# Patient Record
Sex: Female | Born: 1950
Health system: Southern US, Community
[De-identification: ages and names within clinical notes are randomized; demographics above are authoritative.]

## PROBLEM LIST (undated history)

## (undated) DIAGNOSIS — S060X9A Concussion with loss of consciousness of unspecified duration, initial encounter: Secondary | ICD-10-CM

## (undated) DIAGNOSIS — F329 Major depressive disorder, single episode, unspecified: Secondary | ICD-10-CM

## (undated) DIAGNOSIS — M7989 Other specified soft tissue disorders: Secondary | ICD-10-CM

## (undated) DIAGNOSIS — F1010 Alcohol abuse, uncomplicated: Secondary | ICD-10-CM

## (undated) DIAGNOSIS — Z923 Personal history of irradiation: Secondary | ICD-10-CM

## (undated) DIAGNOSIS — K529 Noninfective gastroenteritis and colitis, unspecified: Secondary | ICD-10-CM

## (undated) DIAGNOSIS — E079 Disorder of thyroid, unspecified: Secondary | ICD-10-CM

## (undated) DIAGNOSIS — C801 Malignant (primary) neoplasm, unspecified: Secondary | ICD-10-CM

## (undated) DIAGNOSIS — E559 Vitamin D deficiency, unspecified: Secondary | ICD-10-CM

## (undated) DIAGNOSIS — I4891 Unspecified atrial fibrillation: Secondary | ICD-10-CM

## (undated) DIAGNOSIS — F32A Depression, unspecified: Secondary | ICD-10-CM

## (undated) DIAGNOSIS — G8929 Other chronic pain: Secondary | ICD-10-CM

## (undated) DIAGNOSIS — M4800 Spinal stenosis, site unspecified: Secondary | ICD-10-CM

## (undated) DIAGNOSIS — F419 Anxiety disorder, unspecified: Secondary | ICD-10-CM

## (undated) DIAGNOSIS — I10 Essential (primary) hypertension: Secondary | ICD-10-CM

## (undated) DIAGNOSIS — Z9221 Personal history of antineoplastic chemotherapy: Secondary | ICD-10-CM

## (undated) DIAGNOSIS — E039 Hypothyroidism, unspecified: Secondary | ICD-10-CM

## (undated) HISTORY — DX: Other specified soft tissue disorders: M79.89

## (undated) HISTORY — DX: Vitamin D deficiency, unspecified: E55.9

## (undated) HISTORY — DX: Essential (primary) hypertension: I10

## (undated) HISTORY — DX: Major depressive disorder, single episode, unspecified: F32.9

## (undated) HISTORY — DX: Spinal stenosis, site unspecified: M48.00

## (undated) HISTORY — PX: SHOULDER SURGERY: SHX246

## (undated) HISTORY — DX: Noninfective gastroenteritis and colitis, unspecified: K52.9

## (undated) HISTORY — PX: EYE SURGERY: SHX253

## (undated) HISTORY — DX: Anxiety disorder, unspecified: F41.9

## (undated) HISTORY — DX: Disorder of thyroid, unspecified: E07.9

## (undated) HISTORY — DX: Concussion with loss of consciousness of unspecified duration, initial encounter: S06.0X9A

## (undated) HISTORY — DX: Depression, unspecified: F32.A

---

## 1998-03-06 ENCOUNTER — Ambulatory Visit (HOSPITAL_COMMUNITY): Admission: RE | Admit: 1998-03-06 | Discharge: 1998-03-06 | Payer: Self-pay | Admitting: *Deleted

## 1998-03-20 HISTORY — PX: BREAST SURGERY: SHX581

## 1998-03-24 ENCOUNTER — Ambulatory Visit (HOSPITAL_COMMUNITY): Admission: RE | Admit: 1998-03-24 | Discharge: 1998-03-24 | Payer: Self-pay | Admitting: *Deleted

## 1999-02-26 ENCOUNTER — Ambulatory Visit (HOSPITAL_COMMUNITY): Admission: RE | Admit: 1999-02-26 | Discharge: 1999-02-26 | Payer: Self-pay | Admitting: Obstetrics and Gynecology

## 1999-02-26 ENCOUNTER — Encounter: Payer: Self-pay | Admitting: Obstetrics and Gynecology

## 1999-10-06 ENCOUNTER — Other Ambulatory Visit: Admission: RE | Admit: 1999-10-06 | Discharge: 1999-10-06 | Payer: Self-pay | Admitting: Obstetrics and Gynecology

## 2000-05-03 ENCOUNTER — Encounter: Payer: Self-pay | Admitting: Family Medicine

## 2000-05-03 ENCOUNTER — Ambulatory Visit (HOSPITAL_COMMUNITY): Admission: RE | Admit: 2000-05-03 | Discharge: 2000-05-03 | Payer: Self-pay | Admitting: Family Medicine

## 2000-10-05 ENCOUNTER — Other Ambulatory Visit: Admission: RE | Admit: 2000-10-05 | Discharge: 2000-10-05 | Payer: Self-pay | Admitting: Obstetrics and Gynecology

## 2000-10-11 ENCOUNTER — Encounter: Payer: Self-pay | Admitting: Obstetrics and Gynecology

## 2000-10-11 ENCOUNTER — Encounter: Admission: RE | Admit: 2000-10-11 | Discharge: 2000-10-11 | Payer: Self-pay | Admitting: Obstetrics and Gynecology

## 2001-11-01 ENCOUNTER — Other Ambulatory Visit: Admission: RE | Admit: 2001-11-01 | Discharge: 2001-11-01 | Payer: Self-pay | Admitting: Obstetrics and Gynecology

## 2002-11-07 ENCOUNTER — Other Ambulatory Visit: Admission: RE | Admit: 2002-11-07 | Discharge: 2002-11-07 | Payer: Self-pay | Admitting: Obstetrics and Gynecology

## 2003-10-01 ENCOUNTER — Ambulatory Visit (HOSPITAL_COMMUNITY): Admission: RE | Admit: 2003-10-01 | Discharge: 2003-10-01 | Payer: Self-pay | Admitting: Gastroenterology

## 2003-11-20 ENCOUNTER — Other Ambulatory Visit: Admission: RE | Admit: 2003-11-20 | Discharge: 2003-11-20 | Payer: Self-pay | Admitting: Obstetrics and Gynecology

## 2004-01-08 ENCOUNTER — Encounter: Admission: RE | Admit: 2004-01-08 | Discharge: 2004-01-08 | Payer: Self-pay | Admitting: Obstetrics and Gynecology

## 2004-02-12 ENCOUNTER — Encounter: Admission: RE | Admit: 2004-02-12 | Discharge: 2004-02-12 | Payer: Self-pay | Admitting: Orthopedic Surgery

## 2004-12-21 ENCOUNTER — Other Ambulatory Visit: Admission: RE | Admit: 2004-12-21 | Discharge: 2004-12-21 | Payer: Self-pay | Admitting: Obstetrics and Gynecology

## 2005-12-27 ENCOUNTER — Other Ambulatory Visit: Admission: RE | Admit: 2005-12-27 | Discharge: 2005-12-27 | Payer: Self-pay | Admitting: Obstetrics and Gynecology

## 2006-01-18 ENCOUNTER — Encounter: Admission: RE | Admit: 2006-01-18 | Discharge: 2006-01-18 | Payer: Self-pay | Admitting: Obstetrics and Gynecology

## 2006-12-29 ENCOUNTER — Other Ambulatory Visit: Admission: RE | Admit: 2006-12-29 | Discharge: 2006-12-29 | Payer: Self-pay | Admitting: Obstetrics & Gynecology

## 2006-12-31 ENCOUNTER — Other Ambulatory Visit: Admission: RE | Admit: 2006-12-31 | Discharge: 2006-12-31 | Payer: Self-pay | Admitting: Obstetrics and Gynecology

## 2007-02-18 DIAGNOSIS — S060XAA Concussion with loss of consciousness status unknown, initial encounter: Secondary | ICD-10-CM

## 2007-02-18 DIAGNOSIS — S060X9A Concussion with loss of consciousness of unspecified duration, initial encounter: Secondary | ICD-10-CM

## 2007-02-18 HISTORY — DX: Concussion with loss of consciousness status unknown, initial encounter: S06.0XAA

## 2007-02-18 HISTORY — DX: Concussion with loss of consciousness of unspecified duration, initial encounter: S06.0X9A

## 2007-03-05 ENCOUNTER — Inpatient Hospital Stay (HOSPITAL_COMMUNITY): Admission: EM | Admit: 2007-03-05 | Discharge: 2007-03-09 | Payer: Self-pay | Admitting: Neurosurgery

## 2007-03-05 ENCOUNTER — Encounter: Payer: Self-pay | Admitting: Emergency Medicine

## 2007-04-19 ENCOUNTER — Encounter: Admission: RE | Admit: 2007-04-19 | Discharge: 2007-04-19 | Payer: Self-pay | Admitting: Neurosurgery

## 2007-06-28 ENCOUNTER — Encounter: Admission: RE | Admit: 2007-06-28 | Discharge: 2007-06-28 | Payer: Self-pay | Admitting: Neurology

## 2008-08-08 ENCOUNTER — Encounter: Admission: RE | Admit: 2008-08-08 | Discharge: 2008-08-08 | Payer: Self-pay | Admitting: Family Medicine

## 2009-01-30 ENCOUNTER — Other Ambulatory Visit: Admission: RE | Admit: 2009-01-30 | Discharge: 2009-01-30 | Payer: Self-pay | Admitting: Obstetrics and Gynecology

## 2009-03-20 ENCOUNTER — Encounter: Admission: RE | Admit: 2009-03-20 | Discharge: 2009-03-20 | Payer: Self-pay | Admitting: Obstetrics and Gynecology

## 2010-12-24 ENCOUNTER — Encounter
Admission: RE | Admit: 2010-12-24 | Discharge: 2010-12-24 | Payer: Self-pay | Source: Home / Self Care | Attending: Family Medicine | Admitting: Family Medicine

## 2010-12-29 ENCOUNTER — Encounter
Admission: RE | Admit: 2010-12-29 | Discharge: 2011-01-19 | Payer: Self-pay | Source: Home / Self Care | Attending: Family Medicine | Admitting: Family Medicine

## 2011-03-02 ENCOUNTER — Encounter (HOSPITAL_COMMUNITY)
Admission: RE | Admit: 2011-03-02 | Discharge: 2011-03-02 | Disposition: A | Discharge status: Home / Self Care | Payer: BC Managed Care – PPO | Source: Ambulatory Visit | Attending: Neurosurgery | Admitting: Neurosurgery

## 2011-03-02 LAB — CBC
HCT: 37.8 % (ref 36.0–46.0)
Hemoglobin: 12.7 g/dL (ref 12.0–15.0)
MCH: 31.1 pg (ref 26.0–34.0)
MCHC: 33.6 g/dL (ref 30.0–36.0)
MCV: 92.6 fL (ref 78.0–100.0)
Platelets: 242 10*3/uL (ref 150–400)
RBC: 4.08 MIL/uL (ref 3.87–5.11)
RDW: 12.2 % (ref 11.5–15.5)
WBC: 5.7 10*3/uL (ref 4.0–10.5)

## 2011-03-02 LAB — TYPE AND SCREEN
ABO/RH(D): A POS
Antibody Screen: NEGATIVE

## 2011-03-02 LAB — SURGICAL PCR SCREEN
MRSA, PCR: NEGATIVE
Staphylococcus aureus: NEGATIVE

## 2011-03-02 LAB — ABO/RH: ABO/RH(D): A POS

## 2011-03-04 ENCOUNTER — Inpatient Hospital Stay (HOSPITAL_COMMUNITY): Payer: BC Managed Care – PPO

## 2011-03-04 ENCOUNTER — Inpatient Hospital Stay (HOSPITAL_COMMUNITY)
Admission: RE | Admit: 2011-03-04 | Discharge: 2011-03-08 | DRG: 756 | Disposition: A | Discharge status: Home / Self Care | Payer: BC Managed Care – PPO | Source: Ambulatory Visit | Attending: Neurosurgery | Admitting: Neurosurgery

## 2011-03-04 DIAGNOSIS — M7130 Other bursal cyst, unspecified site: Secondary | ICD-10-CM | POA: Diagnosis present

## 2011-03-04 DIAGNOSIS — Z01818 Encounter for other preprocedural examination: Secondary | ICD-10-CM

## 2011-03-04 DIAGNOSIS — Z01812 Encounter for preprocedural laboratory examination: Secondary | ICD-10-CM

## 2011-03-04 DIAGNOSIS — M545 Low back pain, unspecified: Secondary | ICD-10-CM | POA: Diagnosis present

## 2011-03-04 DIAGNOSIS — M5137 Other intervertebral disc degeneration, lumbosacral region: Secondary | ICD-10-CM | POA: Diagnosis present

## 2011-03-04 DIAGNOSIS — M4310 Spondylolisthesis, site unspecified: Principal | ICD-10-CM | POA: Diagnosis present

## 2011-03-04 DIAGNOSIS — M48061 Spinal stenosis, lumbar region without neurogenic claudication: Secondary | ICD-10-CM | POA: Diagnosis present

## 2011-03-04 DIAGNOSIS — M51379 Other intervertebral disc degeneration, lumbosacral region without mention of lumbar back pain or lower extremity pain: Secondary | ICD-10-CM | POA: Diagnosis present

## 2011-03-04 HISTORY — PX: SPINAL FUSION: SHX223

## 2011-03-05 LAB — CBC
HCT: 32.5 % — ABNORMAL LOW (ref 36.0–46.0)
Hemoglobin: 11 g/dL — ABNORMAL LOW (ref 12.0–15.0)
MCH: 32.3 pg (ref 26.0–34.0)
MCHC: 33.8 g/dL (ref 30.0–36.0)
MCV: 95.3 fL (ref 78.0–100.0)
Platelets: 202 10*3/uL (ref 150–400)
RBC: 3.41 MIL/uL — ABNORMAL LOW (ref 3.87–5.11)
RDW: 12.6 % (ref 11.5–15.5)
WBC: 8.7 10*3/uL (ref 4.0–10.5)

## 2011-03-05 LAB — BASIC METABOLIC PANEL
BUN: 6 mg/dL (ref 6–23)
CO2: 29 mEq/L (ref 19–32)
Calcium: 8.3 mg/dL — ABNORMAL LOW (ref 8.4–10.5)
Chloride: 102 mEq/L (ref 96–112)
Creatinine, Ser: 0.8 mg/dL (ref 0.4–1.2)
GFR calc Af Amer: >60 mL/min (ref >60)
GFR calc non Af Amer: >60 mL/min (ref >60)
Glucose, Bld: 124 mg/dL — ABNORMAL HIGH (ref 70–99)
Potassium: 3.7 mEq/L (ref 3.5–5.1)
Sodium: 135 mEq/L (ref 135–145)

## 2011-03-05 LAB — GLUCOSE, CAPILLARY: Glucose-Capillary: 86 mg/dL (ref 70–99)

## 2011-03-15 NOTE — Op Note (Signed)
[name], [name]              ACCOUNT NO.:  [id number]  MEDICAL RECORD NO.:  [id number]           PATIENT TYPE:  I  LOCATION:  3526                         FACILITY:  [facility]  PHYSICIAN:  Cristi Loron, M.D.DATE OF BIRTH:  November 24, 1951  DATE OF PROCEDURE:  03/04/2011 DATE OF DISCHARGE:                              OPERATIVE REPORT   BRIEF HISTORY:  The patient is a 60 year old white female who has suffered from back, hip, and leg pain consistent with neurogenic claudication.  She has failed medical management, was worked up with lumbar MRI which demonstrated the patient has a grade 1 spondylolisthesis at L4-5 with severe facet arthropathy, synovial cyst, spinal stenosis, etc.  I discussed the various treatment options with the patient including surgery.  She has weighed the risks, benefits, and alternatives of surgery, and wants to proceed with an L4-5 decompression, instrumentation, and fusion.  PREOPERATIVE DIAGNOSES:  L4-5 grade 1 acquired spondylolisthesis, spinal stenosis, synovial cyst, facet arthropathy, disk degeneration, lumbar radiculopathy, lumbago.  POSTOPERATIVE DIAGNOSES:  L4-5 grade 1 acquired spondylolisthesis, spinal stenosis, synovial cyst, facet arthropathy, disk degeneration, lumbar radiculopathy, lumbago.  PROCEDURE:  Bilateral L4 laminotomies and foraminotomies, two decompressive bilateral L4 as well as L5 nerve roots (the work required to this was in addition to work required to do the posterior lumbar interbody  fusion because of the patient's severe facet arthropathy, spinal stenosis, and large synovial cyst, i.e. we had to perform wide laminotomies and foraminotomies about the bilateral L4 and L5 nerve roots); L4-5 posterior lumbar interbody fusion with local morselized autograft bone and Actifuse bone graft extender; insertion of L4-5 interbody prosthesis (Nobel PEEK interbody prosthesis); L4-5 posterior nonsegmental instrumentation with Legacy  titanium pedicle screws and rods; L4-5 posterolateral arthrodesis with local morselized autograft bone, and Vitoss bone graft extender.  SURGEON:  Cristi Loron, M.D.  ASSISTANT:  Danae Orleans. Venetia Maxon, M.D.  ANESTHESIA:  General endotracheal.  ESTIMATED BLOOD LOSS:  200 mL.  SPECIMENS:  None.  DRAINS:  None.  COMPLICATIONS:  None.  DESCRIPTION OF PROCEDURE:  The patient was brought to the operating room by the anesthesia team.  General endotracheal anesthesia was induced. The patient was then turned to the prone position on the Wilson frame. The lumbosacral region was then prepared with Betadine scrub and Betadine solution.  Sterile drapes were applied and I injected the area to be incised with Marcaine with epinephrine solution.  I used a scalpel to make a linear midline incision over the L4-L5 interspace.  I used electrocautery to perform a bilateral subperiosteal dissection exposing the spinous process, lamina of L3, L4, and L5.  We obtained intraoperative radiograph to confirm our location.  We then inserted a Versatrac retractor for exposure.  We began decompression by using a high-speed drill to perform bilateral L4 laminotomies.  I widened laminotomies and performed medial facetectomies at L4-5 with a Kerrison punch.  We removed the ligamentum flavum at L4- 5.  We encountered a large bilateral synovial cyst as expected.  We removed these using the Kerrison punch.  We perform wide foraminotomies about the bilateral L4 as well as L5 nerve roots completing the decompression.  We now turned our attention to the posterior lumbar interbody fusion. We incised the L4-5 intervertebral disk bilaterally with a 15-blade scalpel.  We performed a partial intervertebral discectomy with pituitary forceps.  We then prepared the vertebral endplates by using curettes to clear the remainder of the intervertebral disk.  We used trial spacers and determined to use 10 x 26 mm interbody  prosthesis bilaterally.  We prefilled prosthesis with a combination of local morselized autograft bone that we obtained during the decompression as well as Actifuse bone graft extender.  We inserted the prosthesis into the L4-5 interspaces bilaterally, of course, after retracting the neural structures out of harm's way.  We then filled the remainder of the intervertebral disk space with Actifuse and local autograft bone, completed the posterior lumbar interbody fusion.  We now turned our attention to the spinal instrumentation.  Under fluoroscopic guidance, we cannulated the bilateral L4 and L5 pedicles with the bone probe.  We removed the probe and then tapped the tract with a 6.5-mm tap.  We removed the tap and probed inside the pedicles to rule out cortical breeches.  We inserted a 7.5 x 50 mm pedicle screws bilaterally at L4 and L5 under fluoroscopic guidance.  We palpated along the medial aspect of the L4-L5 pedicles and noted there was no cortical breeches.  We then connected the unilateral pedicle screws with a lordotic rod.  We compressed the construct and then secured the rod in place with caps.  This completed the instrumentation.  We now turned our attention to posterolateral arthrodesis.  We used a high-speed drill to decorticate the remainder of the L4 and L5 facets, pars, and transverse processes etc.  We laid a combination of local autograft bone and Vitoss bone graft extender over these decorticated posterolateral structures completing the posterolateral arthrodesis.  We then inspected the thecal sac and the bilateral L4 and L5 nerve roots, and noted they were well decompressed.  We obtained hemostasis using bipolar electrocautery.  We irrigated the wound out with bacitracin solution.  We then removed the retractor and reapproximated the patient's thoracolumbar fascia with interrupted #1 Vicryl suture, subcutaneous tissue with interrupted 2-0 Vicryl suture, and the  skin with Steri-Strips and Benzoin.  The wound was then coated with bacitracin ointment and sterile dressing applied.  The drapes were removed and the patient was subsequently returned to supine position where she was extubated by anesthesia team and transported to post anesthesia care unit in stable condition.  All sponge, instrument, and needle counts were correct at the end of the case.     Cristi Loron, M.D.     JDJ/MEDQ  D:  03/04/2011  T:  03/05/2011  Job:  [id number]  Electronically Signed by Tressie Stalker M.D. on 03/15/2011 09:53:55 AM

## 2011-03-15 NOTE — Discharge Summary (Signed)
  [name], [name]              ACCOUNT NO.:  [id number]  MEDICAL RECORD NO.:  [id number]           PATIENT TYPE:  I  LOCATION:  3023                         FACILITY:  [facility]  PHYSICIAN:  Cristi Loron, M.D.DATE OF BIRTH:  [birth year]  DATE OF ADMISSION:  03/04/2011 DATE OF DISCHARGE:  03/08/2011                              DISCHARGE SUMMARY   BRIEF HISTORY:  The patient is a 60 year old white female who has suffered from back, hip, and leg pain consistent with neurogenic claudication.  She has failed medical management, was worked up with a lumbar MRI which demonstrated the patient has a grade 1 spondylolisthesis at L4-5 with severe facet arthropathy, synovial cyst, spinal stenosis, etc.  I discussed various treatment options with the patient including surgery.  The patient has weighed the risks, benefits, and alternatives to surgery and decided to proceed with a L4-5 decompression instrumentation and fusion. For further details of this admission, please refer to typed history and physical.  HOSPITAL COURSE:  I admitted the patient to [facility] on March 04, 2011.  On the day of admission, I performed L4-5 decompression, instrumentation fusion.  The surgery went well (for full details of this operation, please refer to typed operative note).  POSTOPERATIVE COURSE:  The patient's postoperative course was unremarkable.  She was discharged home on postop day #4, i.e. March 08, 2011.  DISCHARGE PRESCRIPTIONS:  The patient was given prescription for Valium 5 mg #50, one 1 p.o. q.6 h. p.r.n. for pain, one refill; Percocet 10/325, #100 one half to one p.o. q.4 h. p.r.n. for pain.  DISCHARGE INSTRUCTIONS:  The patient was given written and oral discharge instructions.  FINAL DIAGNOSES:  L4-5 grade 1 acquired spondylolisthesis, spinal stenosis, synovial cyst, facet arthropathy, disk degeneration, lumbar radiculopathy, and lumbago.  PROCEDURE PERFORMED:  Bilateral  L4 laminotomies and foraminotomies to decompress the bilateral L4 as well as L5 nerve roots; L4-5 posterior lumbar interbody fusion with local morselized autograft bone and active fused bone graft extender; insertion of L4-5 interbody prosthesis (Novel PEEK interbody prosthesis); L4-5 posterior nonsegmental instrumentation with Legacy titanium pedicle screws and rods; L4-5 posterolateral arthrodesis with local morselized autograft bone and Vitoss bone graft extender.    Cristi Loron, M.D.    JDJ/MEDQ  D:  03/08/2011  T:  03/09/2011  Job:  [id number]  Electronically Signed by Tressie Stalker M.D. on 03/15/2011 09:53:40 AM

## 2011-05-07 NOTE — Discharge Summary (Signed)
NAMENOHEMI, NICKLAUS              ACCOUNT NO.:  [id number]   MEDICAL RECORD NO.:  [id number]          PATIENT TYPE:  INP   LOCATION:  3023                         FACILITY:  [facility]   PHYSICIAN:  Cristi Loron, M.D.DATE OF BIRTH:  [birth year]   DATE OF ADMISSION:  03/05/2007  DATE OF DISCHARGE:  03/09/2007                               DISCHARGE SUMMARY   BRIEF HISTORY:  The patient is a 60 year old white female who was  intoxicated and fell, suffering a subdural hematoma.   NOTE:  For further details of this admission, please refer to typed  history and physical.   HOSPITAL COURSE:  The patient was admitted to [facility] for  observation.  She had a repeat cranial CT scan on March 06, 2007 which  had no significant change from our prior study.  The patient had some  persistent nausea and vomiting, but this resolved, and she was  subsequently discharged to home on March 09, 2007.   FINAL DIAGNOSES:  1. Subdermal hematoma.  2. Closed head injury.   PROCEDURES PERFORMED:  None.   DISCHARGE INSTRUCTIONS:  The patient was instructed to follow up with me  in a week for followup CAT scan.   DISCHARGE PRESCRIPTIONS:  1. Percocet 5 (#15) 1-2 p.o. 4 hours p.r.n. for pain.  2. Librium 25-mg taper.   PLAN:  The patient was also instructed to cut back or quit drinking  alcohol.      Cristi Loron, M.D.  Electronically Signed     JDJ/MEDQ  D:  05/04/2007  T:  05/04/2007  Job:  [id number]

## 2011-05-07 NOTE — Consult Note (Signed)
NAMECLOA, BUSHONG NO.:  [id number]   MEDICAL RECORD NO.:  [id number]          PATIENT TYPE:  EMS   LOCATION:  ED                           FACILITY:  [facility]   PHYSICIAN:  Cristi Loron, M.D.DATE OF BIRTH:  [birth year]   DATE OF CONSULTATION:  03/05/2007  DATE OF DISCHARGE:                                 CONSULTATION   NEUROSURGICAL CONSULTATION:   CHIEF COMPLAINT:  Headache, nausea and vomiting.   The patient is a 60 year old white female who was intoxicated last  evening.  She evidently fell and struck her head.  This was witnessed by  the patient's husband.  There was a brief loss of consciousness/altered  mental status for approximately five minutes.  Patient did not  immediately seek medical attention.  Today, she has had an increasing  headache and multiple episodes of nausea and vomiting.  She has vomited  ten to 12 times by her count.  She came to [facility] Emergency  Department where she was evaluated by Dr. Denton Lank.  Evaluation included a  cranial CT scan which demonstrated the patient had a small left  subarachnoid hemorrhage and small left subdural hematoma and  neurosurgical consultation was requested.  I recommended that the  patient be transferred to [facility] for further observation and  arrangements are being made for that transportation.   Presently, the patient is pleasant.  She complains of headache, some  nausea.  She denies neck pain, back pain, numbness, tingling, weakness,  seizures, etc.   PAST MEDICAL HISTORY:  Positive for:  1. Depression.  2. Hypothyroidism.  3. Benign breast lump.   PAST SURGICAL HISTORY:  Benign breast lumpectomy.   MEDICATIONS PRIOR TO ADMISSION:  1. Prozac daily.  2. Synthroid daily.  3. Prempro daily.   Dosage unknown.   FAMILY HISTORY:  The patient's mother died at age 51 with Alzheimer's  disease.  The patient father died at age 53 with cerebrovascular  accident.   SOCIAL HISTORY:  The  patient is married.  She has a 42 year old child.  She lives in [place].  She is a retired Midwife.  She  admits drinking four to five glasses of wine per day.  She denies drug  use, denies tobacco use.   REVIEW OF SYSTEMS:  Negative except as above.  She feels fine except for  headache and nausea and vomiting.   PHYSICAL EXAMINATION:  GENERAL:  A pleasant 60 year old white female in  no apparent distress.  HEENT:  The patient has left periorbital ecchymosis.  There are no  Battle signs.  No evidence of CSF, otorrhea, rhinorrhea.  Pupils are  equal, round and reactive to light.  Extraocular muscles intact.  Oropharynx benign.  NECK:  Supple.  There are no masses, deformities, tracheal deviation,  jugular venous distension.  She has a mildly decreased cervical range of  motion.  Spurling's testing is negative.  Lhermitte sign was not  present.  Thorax is symmetric.  LUNGS:  Diffuse rhonchi.  HEART:  Regular rhythm.  ABDOMEN:  Soft.  EXTREMITIES:  No obvious deformities.  BACK EXAM:  Normal.  NEUROLOGIC EXAM:  The patient is alert and oriented x3.  Glasgow Coma  Scale 15.  Cranial nerves II-XII were examined bilaterally and grossly  normal.  Vision and hearing are grossly normal bilaterally.  Motor  strength is 5/5 in bilateral deltoid, biceps, triceps, hand grip, psoas,  quadriceps, gastrocnemius, extensor longus.  Her deep tendon reflexes  are normal and symmetric.  There is no ankle clonus.  Sensory exam is  intact to light touch and sensation all tested.  Downgoing toes  bilaterally.  Cerebellar function is intact to rapid and alternating  movements in the upper extremities bilaterally.   IMAGING AND STUDIES:  I have reviewed the patient's cranial CT scan  performed without contrast March 05, 2007 at [facility].  It  demonstrates the patient has a small left lateral superficial  subarachnoid hemorrhage with a small subdural hematoma.  There is no   blood in the basal cisterns.  There is no mass effect.  I do not see any  fractures.   I also reviewed the patient's cervical CT performed without contrast at  [facility] on March 05, 2007.  It demonstrates some mild diffuse  degenerative changes, no acute changes, no fractures or subluxations,  etc.   ASSESSMENT/PLAN:  1. Small left subarachnoid hemorrhage, subdural hematoma.  I have      discussed the situation with the patient and her husband (at the      patient's request).  I have recommended that she be admitted to a      stepdown unit or if one is not available, to ICU to be observed and      that we repeat her cranial CT scan in the morning to make sure      there is no more bleeding.  The patient is agreeable to this plan.  2. Ethanol abuse.  I have discussed this with the patient and have      recommended that she quit or cut back drinking significantly and if      she needs help with it, that she seeks help for this.  I will start      her on the Librium protocol for DT prophylaxis.      Cristi Loron, M.D.  Electronically Signed     JDJ/MEDQ  D:  03/05/2007  T:  03/05/2007  Job:  [id number]

## 2011-05-07 NOTE — Op Note (Signed)
   NAME:  [name], CHILES                        ACCOUNT NO.:  [id number]   MEDICAL RECORD NO.:  [id number]                   PATIENT TYPE:  AMB   LOCATION:  ENDO                                 FACILITY:  [facility]   PHYSICIAN:  Graylin Shiver, M.D.                DATE OF BIRTH:  [birth year]   DATE OF PROCEDURE:  10/01/2003  DATE OF DISCHARGE:                                 OPERATIVE REPORT   PROCEDURE:  Colonoscopy.   INDICATIONS:  Screening.   Informed consent was obtained after explanation of the risks of bleeding,  infection, and perforation.   PREMEDICATION:  Fentanyl 100 mcg IV, Versed 10 mg IV.   DESCRIPTION OF PROCEDURE:  With the patient in the left lateral decubitus  position, a rectal exam was performed and no masses were felt.  The Olympus  pediatric adjustable colonoscope was inserted into the rectum and advanced  around the colon to the cecum.  Cecal landmarks were identified.  The cecum  and ascending colon were normal, the transverse colon normal.  The  descending colon, sigmoid, and rectum were normal.  She tolerated the  procedure well without complications.   IMPRESSION:  Normal colonoscopy to the cecum.                                                Graylin Shiver, M.D.    SFG/MEDQ  D:  10/01/2003  T:  10/01/2003  Job:  [id number]   cc:   Duncan Dull, M.D.  [address]  [place]  [place] [address]  Fax: [phone]

## 2011-06-29 ENCOUNTER — Other Ambulatory Visit: Payer: Self-pay | Admitting: Obstetrics and Gynecology

## 2011-06-29 DIAGNOSIS — Z1231 Encounter for screening mammogram for malignant neoplasm of breast: Secondary | ICD-10-CM

## 2011-07-07 ENCOUNTER — Ambulatory Visit
Admission: RE | Admit: 2011-07-07 | Discharge: 2011-07-07 | Disposition: A | Discharge status: Home / Self Care | Payer: BC Managed Care – PPO | Source: Ambulatory Visit | Attending: Obstetrics and Gynecology | Admitting: Obstetrics and Gynecology

## 2011-07-07 DIAGNOSIS — Z1231 Encounter for screening mammogram for malignant neoplasm of breast: Secondary | ICD-10-CM

## 2013-10-22 ENCOUNTER — Telehealth: Payer: Self-pay | Admitting: Nurse Practitioner

## 2013-10-22 MED ORDER — LEVOTHYROXINE SODIUM 100 MCG PO TABS: 100.0000 ug | ORAL_TABLET | Freq: Every day | ORAL | Status: DC

## 2013-10-22 NOTE — Telephone Encounter (Signed)
Patient needs a refill Levothyroxine  [facility] [phone]

## 2013-10-31 ENCOUNTER — Other Ambulatory Visit: Payer: Self-pay | Admitting: Nurse Practitioner

## 2013-10-31 NOTE — Telephone Encounter (Signed)
AEX 11/13/13 with [name] patient usually gets 3 months at a time #84 tab no refill sent to pharmacy

## 2013-11-13 ENCOUNTER — Encounter: Payer: Self-pay | Admitting: Nurse Practitioner

## 2013-11-13 ENCOUNTER — Ambulatory Visit (INDEPENDENT_AMBULATORY_CARE_PROVIDER_SITE_OTHER): Payer: BC Managed Care – PPO | Admitting: Nurse Practitioner

## 2013-11-13 VITALS — BP 130/84 | HR 64 | Ht 66.5 in | Wt 196.0 lb

## 2013-11-13 DIAGNOSIS — Z01419 Encounter for gynecological examination (general) (routine) without abnormal findings: Secondary | ICD-10-CM

## 2013-11-13 DIAGNOSIS — Z0000 Encounter for general adult medical examination without abnormal findings: Secondary | ICD-10-CM

## 2013-11-13 DIAGNOSIS — E039 Hypothyroidism, unspecified: Secondary | ICD-10-CM

## 2013-11-13 DIAGNOSIS — E559 Vitamin D deficiency, unspecified: Secondary | ICD-10-CM

## 2013-11-13 LAB — POCT URINALYSIS DIPSTICK
Bilirubin, UA: NEGATIVE
Blood, UA: NEGATIVE
Glucose, UA: NEGATIVE
Ketones, UA: NEGATIVE
Leukocytes, UA: NEGATIVE
Nitrite, UA: NEGATIVE
Protein, UA: NEGATIVE
Urobilinogen, UA: NEGATIVE
pH, UA: 7

## 2013-11-13 LAB — HEMOGLOBIN, FINGERSTICK: Hemoglobin, fingerstick: 12.7 g/dL (ref 12.0–16.0)

## 2013-11-13 MED ORDER — CONJ ESTROG-MEDROXYPROGEST ACE 0.3-1.5 MG PO TABS: ORAL_TABLET | ORAL | Status: DC

## 2013-11-13 MED ORDER — LEVOTHYROXINE SODIUM 100 MCG PO TABS: 100.0000 ug | ORAL_TABLET | Freq: Every day | ORAL | Status: DC

## 2013-11-13 NOTE — Progress Notes (Signed)
Patient ID: [name], female   DOB: 1951/08/18, 62 y.o.   MRN: [id number] 62 y.o. [id number] Married Caucasian Fe here for annual exam.  She feels well without new concerns.  She is ready to taper off HRT and will try to do so over the next several months.  No LMP recorded. Patient is postmenopausal.          Sexually active: no  The current method of family planning is none.    Exercising: no  The patient does not participate in regular exercise at present. Smoker:  no  Health Maintenance: Pap: 08/22/12, ASCUS, neg HR HPV MMG: 07/08/11, bi-rads 1; negative Colonoscopy:  2009, normal BMD: never  TDaP: 12/2009 Labs: HB: 12.7  Urine: normal   reports that she has never smoked. She has never used smokeless tobacco. She reports that she drinks about 2.5 ounces of alcohol per week. She reports that she does not use illicit drugs.  Past Medical History  Diagnosis Date  . Anxiety   . Depression   . Concussion 3/08    ICU x 3 days  . Thyroid disease ?1994    Past Surgical History  Procedure Laterality Date  . Breast surgery Right 4/99    breast biopsy, benign  . Spinal fusion  03/04/11    with ORIF    Current Outpatient Prescriptions  Medication Sig Dispense Refill  . buPROPion (WELLBUTRIN XL) 150 MG 24 hr tablet Take 1 tablet by mouth daily.      Marland Kitchen estrogen, conjugated,-medroxyprogesterone (PREMPRO) 0.3-1.5 MG per tablet TAKE 1 TABLET EACH DAY.  84 tablet  0  . HYDROcodone-acetaminophen (NORCO) 10-325 MG per tablet as needed.      Marland Kitchen levothyroxine (SYNTHROID, LEVOTHROID) 100 MCG tablet Take 1 tablet (100 mcg total) by mouth daily before breakfast.  90 tablet  3  . Oxycodone HCl 20 MG TABS as needed.      . sertraline (ZOLOFT) 100 MG tablet Take 1 tablet by mouth daily.       No current facility-administered medications for this visit.    Family History  Problem Relation Age of Onset  . Thyroid disease Mother   . Dementia Mother   . Diabetes Father   . Stroke Father   .  Hypertension Sister   . Diabetes Sister     ROS:  Pertinent items are noted in HPI.  Otherwise, a comprehensive ROS was negative.  Exam:   BP 130/84  Pulse 64  Ht 5' 6.5" (1.689 m)  Wt 196 lb (88.905 kg)  BMI 31.16 kg/m2 Height: 5' 6.5" (168.9 cm)  Ht Readings from Last 3 Encounters:  11/13/13 5' 6.5" (1.689 m)    General appearance: alert, cooperative and appears stated age Head: Normocephalic, without obvious abnormality, atraumatic Neck: no adenopathy, supple, symmetrical, trachea midline and thyroid normal to inspection and palpation Lungs: clear to auscultation bilaterally Breasts: normal appearance, no masses or tenderness Heart: regular rate and rhythm Abdomen: soft, non-tender; no masses,  no organomegaly Extremities: extremities normal, atraumatic, no cyanosis or edema Skin: Skin color, texture, turgor normal. No rashes or lesions Lymph nodes: Cervical, supraclavicular, and axillary nodes normal. No abnormal inguinal nodes palpated Neurologic: Grossly normal   Pelvic: External genitalia:  no lesions              Urethra:  normal appearing urethra with no masses, tenderness or lesions              Bartholin's and Skene's: normal  Vagina: normal appearing vagina with normal color and discharge, no lesions              Cervix: anteverted              Pap taken: yes Bimanual Exam:  Uterus:  normal size, contour, position, consistency, mobility, non-tender              Adnexa: no mass, fullness, tenderness               Rectovaginal: Confirms               Anus:  normal sphincter tone, no lesions  A:  Well Woman with normal exam  Postmenopausal on HRT since 1997 - she must get Mammo prior to new RX  Hypothyroid on replacement  Chronic back pain with pain management.  History of ASCUS pap with negative HR HPV last year  P:   Pap smear as per guidelines   Mammogram is past due she states she will schedule  She may not get another refill of HRT unless  Mammo is done - she is going to use what she has left and taper off HRT over the next several months.  Counseled on potential risk of HRT with DVT, CVA, cancer, etc.  Refill of Synthroid 100 mcg daily for a year  Will follow with labs - TSH and Vit D  Counseled on breast self exam, mammography screening, adequate intake of calcium and vitamin D, diet and exercise return annually or prn  An After Visit Summary was printed and given to the patient.

## 2013-11-13 NOTE — Patient Instructions (Signed)

## 2013-11-14 ENCOUNTER — Other Ambulatory Visit: Payer: Self-pay | Admitting: Certified Nurse Midwife

## 2013-11-14 ENCOUNTER — Other Ambulatory Visit: Payer: Self-pay | Admitting: *Deleted

## 2013-11-14 ENCOUNTER — Telehealth: Payer: Self-pay | Admitting: *Deleted

## 2013-11-14 DIAGNOSIS — E039 Hypothyroidism, unspecified: Secondary | ICD-10-CM

## 2013-11-14 LAB — VITAMIN D 25 HYDROXY (VIT D DEFICIENCY, FRACTURES): Vit D, 25-Hydroxy: 27 ng/mL — ABNORMAL LOW (ref 30–89)

## 2013-11-14 LAB — TSH: TSH: 14.906 u[IU]/mL — ABNORMAL HIGH (ref 0.350–4.500)

## 2013-11-14 MED ORDER — LEVOTHYROXINE SODIUM 125 MCG PO TABS: 125.0000 ug | ORAL_TABLET | Freq: Every day | ORAL | Status: DC

## 2013-11-14 MED ORDER — VITAMIN D (ERGOCALCIFEROL) 1.25 MG (50000 UNIT) PO CAPS: 50000.0000 [IU] | ORAL_CAPSULE | ORAL | Status: DC

## 2013-11-14 NOTE — Telephone Encounter (Signed)
I have attempted to contact this patient by phone with the following results: left message to return my call on answering machine (home).  

## 2013-11-14 NOTE — Progress Notes (Signed)
Encounter reviewed by Dr. Brook Silva.  

## 2013-11-14 NOTE — Telephone Encounter (Signed)
Message copied by Luisa Dago on Wed Nov 14, 2013  1:56 PM ------      Message from: Verner Chol      Created: Wed Nov 14, 2013 12:28 PM       Notify patient that TSH is elevated again and need new dosage.       Order in       Patient will also need Endocrine management now due to change, we will make referral to Endocrine.      Also Vitamin D is low need protocol for her ------

## 2013-11-14 NOTE — Progress Notes (Signed)
Vitamin D sent per protocol per result note.

## 2013-11-19 LAB — IPS PAP TEST WITH REFLEX TO HPV

## 2013-11-20 NOTE — Telephone Encounter (Signed)
Pt notified on 11/26.

## 2013-12-24 ENCOUNTER — Telehealth: Payer: Self-pay | Admitting: Nurse Practitioner

## 2013-12-24 NOTE — Telephone Encounter (Signed)
Spoke with pt to advise that her referral info has been sent to Dr. Almetta Lovely office, and we will call as soon as we receive an appt date and time. Pt appreciative.

## 2013-12-24 NOTE — Telephone Encounter (Signed)
Patient says she is waiting on a referral to an Endocrinologist.

## 2013-12-25 NOTE — Telephone Encounter (Signed)
Spoke with pt about appt with Dr. Chalmers Cater 01-23-14 at 1:30. Phone and address given. Pt agreeable.

## 2014-10-21 ENCOUNTER — Encounter: Payer: Self-pay | Admitting: Nurse Practitioner

## 2014-11-21 ENCOUNTER — Encounter: Payer: Self-pay | Admitting: Nurse Practitioner

## 2014-11-21 ENCOUNTER — Ambulatory Visit: Payer: BC Managed Care – PPO | Admitting: Nurse Practitioner

## 2015-03-12 ENCOUNTER — Encounter: Payer: Self-pay | Admitting: Nurse Practitioner

## 2017-07-11 ENCOUNTER — Emergency Department (HOSPITAL_COMMUNITY)
Admission: EM | Admit: 2017-07-11 | Discharge: 2017-07-11 | Disposition: A | Discharge status: Home / Self Care | Payer: Medicare Other | Attending: Emergency Medicine | Admitting: Emergency Medicine

## 2017-07-11 ENCOUNTER — Encounter (HOSPITAL_COMMUNITY): Payer: Self-pay | Admitting: Emergency Medicine

## 2017-07-11 ENCOUNTER — Emergency Department (HOSPITAL_COMMUNITY): Payer: Medicare Other

## 2017-07-11 DIAGNOSIS — Z79899 Other long term (current) drug therapy: Secondary | ICD-10-CM | POA: Diagnosis not present

## 2017-07-11 DIAGNOSIS — Z23 Encounter for immunization: Secondary | ICD-10-CM | POA: Insufficient documentation

## 2017-07-11 DIAGNOSIS — W19XXXA Unspecified fall, initial encounter: Secondary | ICD-10-CM | POA: Diagnosis not present

## 2017-07-11 DIAGNOSIS — Y999 Unspecified external cause status: Secondary | ICD-10-CM | POA: Insufficient documentation

## 2017-07-11 DIAGNOSIS — S01111A Laceration without foreign body of right eyelid and periocular area, initial encounter: Secondary | ICD-10-CM | POA: Insufficient documentation

## 2017-07-11 DIAGNOSIS — Y929 Unspecified place or not applicable: Secondary | ICD-10-CM | POA: Diagnosis not present

## 2017-07-11 DIAGNOSIS — R55 Syncope and collapse: Secondary | ICD-10-CM | POA: Diagnosis not present

## 2017-07-11 DIAGNOSIS — S060X1A Concussion with loss of consciousness of 30 minutes or less, initial encounter: Secondary | ICD-10-CM | POA: Insufficient documentation

## 2017-07-11 DIAGNOSIS — E039 Hypothyroidism, unspecified: Secondary | ICD-10-CM | POA: Diagnosis not present

## 2017-07-11 DIAGNOSIS — Y939 Activity, unspecified: Secondary | ICD-10-CM | POA: Insufficient documentation

## 2017-07-11 DIAGNOSIS — S0181XA Laceration without foreign body of other part of head, initial encounter: Secondary | ICD-10-CM | POA: Diagnosis not present

## 2017-07-11 DIAGNOSIS — Z043 Encounter for examination and observation following other accident: Secondary | ICD-10-CM | POA: Diagnosis present

## 2017-07-11 LAB — CBC
HCT: 38.5 % (ref 36.0–46.0)
Hemoglobin: 13.4 g/dL (ref 12.0–15.0)
MCH: 31.6 pg (ref 26.0–34.0)
MCHC: 34.8 g/dL (ref 30.0–36.0)
MCV: 90.8 fL (ref 78.0–100.0)
Platelets: 273 10*3/uL (ref 150–400)
RBC: 4.24 MIL/uL (ref 3.87–5.11)
RDW: 13.4 % (ref 11.5–15.5)
WBC: 11.6 10*3/uL — ABNORMAL HIGH (ref 4.0–10.5)

## 2017-07-11 LAB — BASIC METABOLIC PANEL
Anion gap: 12 (ref 5–15)
BUN: 9 mg/dL (ref 6–20)
CO2: 21 mmol/L — ABNORMAL LOW (ref 22–32)
Calcium: 8.8 mg/dL — ABNORMAL LOW (ref 8.9–10.3)
Chloride: 102 mmol/L (ref 101–111)
Creatinine, Ser: 0.65 mg/dL (ref 0.44–1.00)
GFR calc Af Amer: >60 mL/min (ref >60)
GFR calc non Af Amer: >60 mL/min (ref >60)
Glucose, Bld: 103 mg/dL — ABNORMAL HIGH (ref 65–99)
Potassium: 4.2 mmol/L (ref 3.5–5.1)
Sodium: 135 mmol/L (ref 135–145)

## 2017-07-11 LAB — URINALYSIS, ROUTINE W REFLEX MICROSCOPIC
Bilirubin Urine: NEGATIVE
Glucose, UA: NEGATIVE mg/dL
Ketones, ur: 20 mg/dL — AB
Nitrite: NEGATIVE
Protein, ur: 30 mg/dL — AB
Specific Gravity, Urine: 1.02 (ref 1.005–1.030)
pH: 5 (ref 5.0–8.0)

## 2017-07-11 LAB — ETHANOL: Alcohol, Ethyl (B): 20 mg/dL — ABNORMAL HIGH (ref <5)

## 2017-07-11 LAB — I-STAT TROPONIN, ED: Troponin i, poc: 0.01 ng/mL (ref 0.00–0.08)

## 2017-07-11 LAB — PROTIME-INR
INR: 0.95
Prothrombin Time: 12.7 seconds (ref 11.4–15.2)

## 2017-07-11 LAB — MAGNESIUM: Magnesium: 2 mg/dL (ref 1.7–2.4)

## 2017-07-11 LAB — CBG MONITORING, ED: Glucose-Capillary: 98 mg/dL (ref 65–99)

## 2017-07-11 MED ORDER — LIDOCAINE-EPINEPHRINE-TETRACAINE (LET) SOLUTION
3.0000 mL | Freq: Once | NASAL | Status: AC
  Administered 2017-07-11: 3 mL via TOPICAL
  Filled 2017-07-11: qty 3

## 2017-07-11 MED ORDER — LORAZEPAM 0.5 MG PO TABS: 0.5000 mg | ORAL_TABLET | Freq: Three times a day (TID) | ORAL | 0 refills | Status: DC | PRN

## 2017-07-11 MED ORDER — MORPHINE SULFATE (PF) 2 MG/ML IV SOLN
4.0000 mg | Freq: Once | INTRAVENOUS | Status: AC
  Administered 2017-07-11: 4 mg via INTRAVENOUS
  Filled 2017-07-11: qty 2

## 2017-07-11 MED ORDER — LIDOCAINE-EPINEPHRINE 2 %-1:200000 IJ SOLN
20.0000 mL | Freq: Once | INTRAMUSCULAR | Status: AC
Start: 2017-07-11 — End: 2017-07-11
  Administered 2017-07-11: 20 mL via INTRADERMAL
  Filled 2017-07-11: qty 20

## 2017-07-11 MED ORDER — TETANUS-DIPHTH-ACELL PERTUSSIS 5-2.5-18.5 LF-MCG/0.5 IM SUSP
0.5000 mL | Freq: Once | INTRAMUSCULAR | Status: AC
  Administered 2017-07-11: 0.5 mL via INTRAMUSCULAR
  Filled 2017-07-11: qty 0.5

## 2017-07-11 MED ORDER — SODIUM CHLORIDE 0.9 % IV BOLUS (SEPSIS)
1000.0000 mL | Freq: Once | INTRAVENOUS | Status: AC
  Administered 2017-07-11: 1000 mL via INTRAVENOUS

## 2017-07-11 MED ORDER — LORAZEPAM 2 MG/ML IJ SOLN
1.0000 mg | Freq: Once | INTRAMUSCULAR | Status: AC
  Administered 2017-07-11: 1 mg via INTRAVENOUS
  Filled 2017-07-11: qty 1

## 2017-07-11 NOTE — ED Triage Notes (Signed)
Pt c/o fall today, does not remember fall, remembers walking then being on the ground. Head pain and laceration. No other pain. No anticoagulants.

## 2017-07-11 NOTE — ED Notes (Signed)
Provider at bedside

## 2017-07-11 NOTE — ED Provider Notes (Signed)
[facility] DEPT Provider Note   CSN: [id number] Arrival date & time: 07/11/17  1232     History   Chief Complaint Chief Complaint  Patient presents with  . Fall  . Loss of Consciousness    HPI [name] is a 66 y.o. female.  The history is provided by the patient, medical records and a relative.  Fall  This is a new problem. The current episode started 1 to 2 hours ago. The problem occurs rarely. The problem has been resolved. Associated symptoms include headaches. Pertinent negatives include no chest pain, no abdominal pain and no shortness of breath. Nothing aggravates the symptoms. Nothing relieves the symptoms. She has tried nothing for the symptoms. The treatment provided no relief.    Past Medical History:  Diagnosis Date  . Anxiety   . Concussion 3/08   ICU x 3 days  . Depression   . Thyroid disease ?1994    There are no active problems to display for this patient.   Past Surgical History:  Procedure Laterality Date  . BREAST SURGERY Right 4/99   breast biopsy, benign  . SPINAL FUSION  03/04/11   with ORIF    OB History    Gravida Para Term Preterm AB Living   1 1 1     1    SAB TAB Ectopic Multiple Live Births                   Home Medications    Prior to Admission medications   Medication Sig Start Date End Date Taking? Authorizing Provider  buPROPion (WELLBUTRIN XL) 150 MG 24 hr tablet Take 1 tablet by mouth daily. 10/07/13   [provider]  estrogen, conjugated,-medroxyprogesterone (PREMPRO) 0.3-1.5 MG per tablet TAKE 1 TABLET EACH DAY. 11/13/13   [name], FNP  HYDROcodone-acetaminophen (NORCO) 10-325 MG per tablet as needed. 11/09/13   [provider]  levothyroxine (SYNTHROID) 125 MCG tablet Take 1 tablet (125 mcg total) by mouth daily. One po qd 11/14/13   [name], CNM  Oxycodone HCl 20 MG TABS as needed. 11/09/13   [provider]  sertraline (ZOLOFT) 100 MG tablet Take 1 tablet by  mouth daily. 10/07/13   [provider]  Vitamin D, Ergocalciferol, (DRISDOL) 50000 UNITS CAPS capsule Take 1 capsule (50,000 Units total) by mouth every 7 (seven) days. 11/14/13   [name], FNP    Family History Family History  Problem Relation Age of Onset  . Thyroid disease Mother   . Dementia Mother   . Diabetes Father   . Stroke Father   . Hypertension Sister   . Diabetes Sister     Social History Social History  Substance Use Topics  . Smoking status: Never Smoker  . Smokeless tobacco: Never Used  . Alcohol use 2.5 oz/week    5 drink(s) per week     Allergies   Erythromycin   Review of Systems Review of Systems  Constitutional: Negative for chills, diaphoresis, fatigue and fever.  HENT: Negative for congestion.   Eyes: Negative for photophobia and visual disturbance.  Respiratory: Negative for chest tightness, shortness of breath, wheezing and stridor.   Cardiovascular: Negative for chest pain, palpitations and leg swelling.  Gastrointestinal: Negative for abdominal pain, constipation, diarrhea, nausea and vomiting.  Genitourinary: Negative for dysuria, flank pain and frequency.  Musculoskeletal: Negative for back pain, neck pain and neck stiffness.  Skin: Positive for wound. Negative for rash.  Neurological: Positive for syncope and headaches.  Negative for tremors, seizures, weakness, light-headedness and numbness.  Psychiatric/Behavioral: Negative for agitation.  All other systems reviewed and are negative.    Physical Exam Updated Vital Signs BP 118/60 (BP Location: Left Arm)   Pulse 91   Temp 98.3 F (36.8 C) (Oral)   Resp 16   Ht 5' 7.5" (1.715 m)   Wt 89.8 kg (198 lb)   SpO2 97%   BMI 30.55 kg/m   Physical Exam  Constitutional: She is oriented to person, place, and time. She appears well-developed and well-nourished. No distress.  HENT:  Head: Head is with laceration. Head is without abrasion.    Right Ear: External ear  normal.  Left Ear: External ear normal.  Nose: Nose normal.  Mouth/Throat: Oropharynx is clear and moist. No oropharyngeal exudate.  Eyes: Pupils are equal, round, and reactive to light. Conjunctivae and EOM are normal.  Neck: Normal range of motion.  Cardiovascular: Normal rate, normal heart sounds and intact distal pulses.   No murmur heard. Pulmonary/Chest: No stridor. No respiratory distress. She has no wheezes. She exhibits no tenderness.  Abdominal: Soft. Bowel sounds are normal. She exhibits no distension. There is no tenderness.  Musculoskeletal: She exhibits no tenderness.       Right elbow: She exhibits no swelling, no effusion and no laceration. No tenderness found.       Arms: Neurological: She is alert and oriented to person, place, and time. She displays normal reflexes. No cranial nerve deficit or sensory deficit. She exhibits normal muscle tone. Coordination normal.  Skin: Skin is warm. Capillary refill takes less than 2 seconds. She is not diaphoretic. No erythema.  Psychiatric: She has a normal mood and affect.  Nursing note and vitals reviewed.    ED Treatments / Results  Labs (all labs ordered are listed, but only abnormal results are displayed) Labs Reviewed  BASIC METABOLIC PANEL - Abnormal; Notable for the following:       Result Value   CO2 21 (*)    Glucose, Bld 103 (*)    Calcium 8.8 (*)    All other components within normal limits  URINALYSIS, ROUTINE W REFLEX MICROSCOPIC - Abnormal; Notable for the following:    Color, Urine AMBER (*)    APPearance CLOUDY (*)    Hgb urine dipstick SMALL (*)    Ketones, ur 20 (*)    Protein, ur 30 (*)    Leukocytes, UA LARGE (*)    Bacteria, UA MANY (*)    Squamous Epithelial / LPF 6-30 (*)    All other components within normal limits  CBC - Abnormal; Notable for the following:    WBC 11.6 (*)    All other components within normal limits  ETHANOL - Abnormal; Notable for the following:    Alcohol, Ethyl (B) 20 (*)     All other components within normal limits  PROTIME-INR  MAGNESIUM  CBG MONITORING, ED  I-STAT TROPONIN, ED    EKG  EKG Interpretation  Date/Time:  Monday July 11 2017 13:06:22 EDT Ventricular Rate:  100 PR Interval:    QRS Duration: 69 QT Interval:  356 QTC Calculation: 460 R Axis:   2 Text Interpretation:  Age not entered, assumed to be  66 years old for purpose of ECG interpretation Sinus tachycardia When compared to prior, no significant changes seen.  No STEMI Confirmed by Antony Blackbird [phone]) on 07/11/2017 4:52:07 PM       Radiology Dg Chest 2 View  Result Date: 07/11/2017 CLINICAL  DATA:  66 year old who fell earlier today, sustaining a laceration to the head associated with severe headache. Current history of hypertension. Former smoker. Patient is amnestic to the fall. Initial encounter. EXAM: CHEST  2 VIEW COMPARISON:  None. FINDINGS: Cardiac silhouette normal in size. Thoracic aorta tortuous. Hilar and mediastinal contours otherwise unremarkable. Lungs clear. Bronchovascular markings normal. Pulmonary vascularity normal. No visible pleural effusions. No pneumothorax. Mild degenerative changes involving the thoracic spine. Degenerative changes involving the visualized upper lumbar spine. IMPRESSION: No acute cardiopulmonary disease. Electronically Signed   By: Evangeline Dakin M.D.   On: 07/11/2017 17:30   Ct Head Wo Contrast  Result Date: 07/11/2017 CLINICAL DATA:  66 year old female status post fall EXAM: CT HEAD WITHOUT CONTRAST TECHNIQUE: Contiguous axial images were obtained from the base of the skull through the vertex without intravenous contrast. COMPARISON:  Prior CT scan of the head 04/19/2007 FINDINGS: Brain: Negative for acute intracranial hemorrhage, acute infarction, mass, mass effect, hydrocephalus or midline shift. Gray-white differentiation is preserved throughout. Stable cerebral cortical volume loss. Vascular: No hyperdense vessel or unexpected  calcification. Skull: Normal. Negative for fracture or focal lesion. Sinuses/Orbits: No acute finding. Other: Soft tissue laceration overlying the right frontal bone. No associated hematoma or skull fracture. IMPRESSION: 1. No acute intracranial abnormality. 2. Right forehead laceration without underlying fracture or scalp hematoma. 3. Moderate cerebral cortical volume loss. Electronically Signed   By: Jacqulynn Cadet M.D.   On: 07/11/2017 17:35    Procedures .Marland KitchenLaceration Repair Date/Time: 07/12/2017 11:50 AM Performed by: Courtney Paris Authorized by: Courtney Paris   Consent:    Consent obtained:  Verbal   Consent given by:  Patient   Risks discussed:  Infection, pain, poor cosmetic result, poor wound healing and need for additional repair   Alternatives discussed:  No treatment Anesthesia (see MAR for exact dosages):    Anesthesia method:  Topical application and local infiltration   Topical anesthetic:  LET   Local anesthetic:  Lidocaine 2% WITH epi Laceration details:    Location:  Face   Face location:  Forehead   Length (cm):  3   Depth (mm):  2 Repair type:    Repair type:  Simple Pre-procedure details:    Preparation:  Patient was prepped and draped in usual sterile fashion and imaging obtained to evaluate for foreign bodies Exploration:    Wound exploration: wound explored through full range of motion and entire depth of wound probed and visualized   Treatment:    Area cleansed with:  Saline   Amount of cleaning:  Standard   Irrigation solution:  Sterile saline   Irrigation method:  Syringe Skin repair:    Repair method:  Sutures   Suture size:  5-0   Suture material:  Prolene   Suture technique:  Simple interrupted   Number of sutures:  4 Approximation:    Approximation:  Close   Vermilion border: well-aligned   Post-procedure details:    Dressing:  Antibiotic ointment   Patient tolerance of procedure:  Tolerated well, no immediate  complications .Marland KitchenLaceration Repair Date/Time: 07/12/2017 11:51 AM Performed by: Courtney Paris Authorized by: Courtney Paris   Consent:    Consent obtained:  Verbal   Consent given by:  Patient   Risks discussed:  Infection, pain, poor cosmetic result, poor wound healing and need for additional repair Anesthesia (see MAR for exact dosages):    Anesthesia method:  Topical application and local infiltration   Topical anesthetic:  LET  Local anesthetic:  Lidocaine 2% WITH epi Laceration details:    Location:  Face   Face location:  R eyebrow   Length (cm):  3   Depth (mm):  3 Repair type:    Repair type:  Simple Pre-procedure details:    Preparation:  Imaging obtained to evaluate for foreign bodies and patient was prepped and draped in usual sterile fashion Exploration:    Wound exploration: wound explored through full range of motion and entire depth of wound probed and visualized     Contaminated: no   Treatment:    Area cleansed with:  Saline   Amount of cleaning:  Standard   Irrigation solution:  Sterile saline   Irrigation method:  Syringe   Visualized foreign bodies/material removed: no   Skin repair:    Repair method:  Sutures   Suture size:  5-0   Suture material:  Prolene   Suture technique:  Simple interrupted   Number of sutures:  7 Approximation:    Approximation:  Close   Vermilion border: well-aligned   Post-procedure details:    Dressing:  Antibiotic ointment   Patient tolerance of procedure:  Tolerated well, no immediate complications   (including critical care time)  Medications Ordered in ED Medications  sodium chloride 0.9 % bolus 1,000 mL (0 mLs Intravenous Stopped 07/11/17 1938)  LORazepam (ATIVAN) injection 1 mg (1 mg Intravenous Given 07/11/17 1742)  Tdap (BOOSTRIX) injection 0.5 mL (0.5 mLs Intramuscular Given 07/11/17 1743)  lidocaine-EPINEPHrine-tetracaine (LET) solution (3 mLs Topical Given 07/11/17 1747)  lidocaine-EPINEPHrine  (XYLOCAINE W/EPI) 2 %-1:200000 (PF) injection 20 mL (20 mLs Intradermal Given by Other 07/11/17 1851)  morphine 2 MG/ML injection 4 mg (4 mg Intravenous Given 07/11/17 1938)     Initial Impression / Assessment and Plan / ED Course  I have reviewed the triage vital signs and the nursing notes.  Pertinent labs & imaging results that were available during my care of the patient were reviewed by me and considered in my medical decision making (see chart for details).     MANIKA HAST is a 66 y.o. female with a past medical history significant for depression and thyroid disease who presents with a syncopal episode and subsequent head injury. Patient says that she was walking down her hall when she passed out falling headfirst off the floor. She was seen by her husband who reports she was unconscious for partially 1 minute. She denied any preceding symptoms. She says that she had a significant amount of alcohol to drink earlier today. She denies any coingestants or drugs. She says that after the fall, she is having a right-sided anterior headache. She is not sure of her last tetanus vaccination. She says that she is under anxious recently but would not elaborate why. She reports she has not been sleeping recently. She denies any shortness breath, chest pain. She says she has chronic palpitations. She denies any nausea, vomiting, or vision changes. She also reports some pain in her right elbow that she is not concerned about. She denies any abdominal pain or back pain. She denies any neck pain.  On exam, patient is bruising to the right elbow. Patient's full range of motion of the elbow, normal grip strength, sensation, and pulses. Patient has to 3 severe lacerations to her right forehead and eyebrow. Patient is full range of motion of extraocular movements. Normal vision. Normal sensation of the face. Wounds are hemostatic. Patient has tenderness around him with no significant crepitance. Patient has  no  evidence of intraoral injury. Neck range of motion intact and patient has no neck tenderness. No other focal neurologic deficits appreciated. Lungs clear and chest nontender. Back nontender. Abdomen nontender.  Due to patient's head injury, patient will imaging of the head. Patient also had EKG, labs, and chest x-ray due to the syncopal episode. Patient will have urinalysis and alcohol level checked. Patient will be given fluids and anxiety medicine as patient is very anxious in the room. Patient will need repair of the lacerations after imaging.  Patient's diagnostic workup was grossly reassuring. Initial troponin negative. INR not elevated. Alcohol slightly elevated at 20. Magnesium nonelevated. Mild leukocytosis of 11.6 but no anemia. BMP showed normal creatinine and normal potassium. No evidence of anion gap elevation. Urinalysis showed no nitrites but mild leukocytes and bacteria. However there is also evidence of squamous cells likely revealing contamination. Given her lack of urinary symptoms, doubt UTI.  CT imaging showed no evidence of intracranial injury or skull fracture. No evidence of foreign bodies present  Laceration was repaired as described above after washout. Patient will follow-up with PCP for suture removal and reevaluation. Suspect mild concussion. Patient felt much better after fluids. Suspect component of dehydration leading to Fall. Patient advised that occasionally patients need to be admitted and observed for syncope however, given patient's reassuring workup and feeling much better after wound repair and fluids, patient felt stable for discharge home. Patient requested Ativan as this helped her significantly today for her anxiety. She reports that she will pick up her other prescriptions for benzos tomorrow. This was felt reasonable.  Patient given return precautions for signs and symptoms of infection or other traumatic injuries. Patient given instructions on concussion  management. Patient and family had no depressions or concerns understood return precautions. Patient discharged in good condition.    Final Clinical Impressions(s) / ED Diagnoses   Final diagnoses:  Fall, initial encounter  Laceration of forehead, initial encounter  Laceration of right eyebrow, initial encounter  Syncope, unspecified syncope type  Concussion with loss of consciousness of 30 minutes or less, initial encounter    New Prescriptions Discharge Medication List as of 07/11/2017  8:59 PM    START taking these medications   Details  LORazepam (ATIVAN) 0.5 MG tablet Take 1 tablet (0.5 mg total) by mouth every 8 (eight) hours as needed for anxiety., Starting Mon 07/11/2017, Print        Clinical Impression: 1. Fall, initial encounter   2. Laceration of forehead, initial encounter   3. Laceration of right eyebrow, initial encounter   4. Syncope, unspecified syncope type   5. Concussion with loss of consciousness of 30 minutes or less, initial encounter     Disposition: Discharge  Condition: Good  I have discussed the results, Dx and Tx plan with the pt(& family if present). He/she/they expressed understanding and agree(s) with the plan. Discharge instructions discussed at great length. Strict return precautions discussed and pt &/or family have verbalized understanding of the instructions. No further questions at time of discharge.    Discharge Medication List as of 07/11/2017  8:59 PM    START taking these medications   Details  LORazepam (ATIVAN) 0.5 MG tablet Take 1 tablet (0.5 mg total) by mouth every 8 (eight) hours as needed for anxiety., Starting Mon 07/11/2017, Print        Follow Up: Maurice Small, MD [address]  [place] [address] [phone]  Schedule an appointment as soon as possible for  a visit    [facility] [place] [id number] [place] [phone]  If symptoms worsen     Amia Rynders, [name], MD 07/12/17 1152

## 2017-07-11 NOTE — ED Notes (Signed)
Provider remains at bedside.

## 2017-07-11 NOTE — ED Notes (Signed)
Patient called for blood draw with no answer.

## 2017-07-11 NOTE — Discharge Instructions (Signed)
Please follow-up with your primary care physician for further evaluation after your fall and syncopal episode today. We suspect you have a component of dehydration as well, please stay hydrated. Please follow-up with your PCP in 7-10 days for suture removal. Please watch for signs and symptoms of infection of the wound. You may have a concussion. If any symptoms change or worsen, please return to the nearest emergency department.

## 2017-07-11 NOTE — ED Notes (Signed)
Pt transported to radiology.

## 2018-02-01 ENCOUNTER — Emergency Department (HOSPITAL_COMMUNITY): Payer: Medicare Other

## 2018-02-01 ENCOUNTER — Encounter (HOSPITAL_COMMUNITY): Payer: Self-pay | Admitting: Emergency Medicine

## 2018-02-01 ENCOUNTER — Observation Stay (HOSPITAL_COMMUNITY)
Admission: EM | Admit: 2018-02-01 | Discharge: 2018-02-02 | Disposition: A | Discharge status: Home / Self Care | Payer: Medicare Other | Attending: Family Medicine | Admitting: Family Medicine

## 2018-02-01 DIAGNOSIS — Z881 Allergy status to other antibiotic agents status: Secondary | ICD-10-CM | POA: Diagnosis not present

## 2018-02-01 DIAGNOSIS — E669 Obesity, unspecified: Secondary | ICD-10-CM | POA: Diagnosis not present

## 2018-02-01 DIAGNOSIS — T428X5A Adverse effect of antiparkinsonism drugs and other central muscle-tone depressants, initial encounter: Secondary | ICD-10-CM | POA: Diagnosis not present

## 2018-02-01 DIAGNOSIS — Z981 Arthrodesis status: Secondary | ICD-10-CM | POA: Diagnosis not present

## 2018-02-01 DIAGNOSIS — I491 Atrial premature depolarization: Secondary | ICD-10-CM | POA: Diagnosis not present

## 2018-02-01 DIAGNOSIS — F329 Major depressive disorder, single episode, unspecified: Secondary | ICD-10-CM | POA: Diagnosis not present

## 2018-02-01 DIAGNOSIS — G8929 Other chronic pain: Secondary | ICD-10-CM | POA: Diagnosis not present

## 2018-02-01 DIAGNOSIS — I4581 Long QT syndrome: Secondary | ICD-10-CM | POA: Diagnosis not present

## 2018-02-01 DIAGNOSIS — T402X5A Adverse effect of other opioids, initial encounter: Secondary | ICD-10-CM | POA: Diagnosis not present

## 2018-02-01 DIAGNOSIS — T424X5A Adverse effect of benzodiazepines, initial encounter: Secondary | ICD-10-CM | POA: Diagnosis not present

## 2018-02-01 DIAGNOSIS — Z79890 Hormone replacement therapy (postmenopausal): Secondary | ICD-10-CM | POA: Insufficient documentation

## 2018-02-01 DIAGNOSIS — Z79899 Other long term (current) drug therapy: Secondary | ICD-10-CM | POA: Insufficient documentation

## 2018-02-01 DIAGNOSIS — F419 Anxiety disorder, unspecified: Secondary | ICD-10-CM | POA: Insufficient documentation

## 2018-02-01 DIAGNOSIS — G9340 Encephalopathy, unspecified: Secondary | ICD-10-CM | POA: Diagnosis not present

## 2018-02-01 DIAGNOSIS — Z888 Allergy status to other drugs, medicaments and biological substances status: Secondary | ICD-10-CM | POA: Insufficient documentation

## 2018-02-01 DIAGNOSIS — N179 Acute kidney failure, unspecified: Secondary | ICD-10-CM | POA: Diagnosis not present

## 2018-02-01 DIAGNOSIS — E039 Hypothyroidism, unspecified: Secondary | ICD-10-CM | POA: Insufficient documentation

## 2018-02-01 DIAGNOSIS — M549 Dorsalgia, unspecified: Secondary | ICD-10-CM | POA: Insufficient documentation

## 2018-02-01 DIAGNOSIS — G92 Toxic encephalopathy: Secondary | ICD-10-CM | POA: Diagnosis not present

## 2018-02-01 DIAGNOSIS — F1010 Alcohol abuse, uncomplicated: Secondary | ICD-10-CM | POA: Diagnosis not present

## 2018-02-01 DIAGNOSIS — R4182 Altered mental status, unspecified: Secondary | ICD-10-CM | POA: Diagnosis present

## 2018-02-01 DIAGNOSIS — Z6833 Body mass index (BMI) 33.0-33.9, adult: Secondary | ICD-10-CM | POA: Insufficient documentation

## 2018-02-01 DIAGNOSIS — R635 Abnormal weight gain: Secondary | ICD-10-CM

## 2018-02-01 DIAGNOSIS — I10 Essential (primary) hypertension: Secondary | ICD-10-CM | POA: Diagnosis not present

## 2018-02-01 LAB — COMPREHENSIVE METABOLIC PANEL
ALT: 13 U/L — ABNORMAL LOW (ref 14–54)
AST: 18 U/L (ref 15–41)
Albumin: 3.9 g/dL (ref 3.5–5.0)
Alkaline Phosphatase: 65 U/L (ref 38–126)
Anion gap: 16 — ABNORMAL HIGH (ref 5–15)
BUN: 23 mg/dL — ABNORMAL HIGH (ref 6–20)
CO2: 22 mmol/L (ref 22–32)
Calcium: 9.1 mg/dL (ref 8.9–10.3)
Chloride: 102 mmol/L (ref 101–111)
Creatinine, Ser: 1.18 mg/dL — ABNORMAL HIGH (ref 0.44–1.00)
GFR calc Af Amer: 54 mL/min — ABNORMAL LOW (ref >60)
GFR calc non Af Amer: 46 mL/min — ABNORMAL LOW (ref >60)
Glucose, Bld: 117 mg/dL — ABNORMAL HIGH (ref 65–99)
Potassium: 3.5 mmol/L (ref 3.5–5.1)
Sodium: 140 mmol/L (ref 135–145)
Total Bilirubin: 0.9 mg/dL (ref 0.3–1.2)
Total Protein: 7.4 g/dL (ref 6.5–8.1)

## 2018-02-01 LAB — CBC
HCT: 38.6 % (ref 36.0–46.0)
Hemoglobin: 13 g/dL (ref 12.0–15.0)
MCH: 30.2 pg (ref 26.0–34.0)
MCHC: 33.7 g/dL (ref 30.0–36.0)
MCV: 89.8 fL (ref 78.0–100.0)
Platelets: 253 10*3/uL (ref 150–400)
RBC: 4.3 MIL/uL (ref 3.87–5.11)
RDW: 14.2 % (ref 11.5–15.5)
WBC: 9.9 10*3/uL (ref 4.0–10.5)

## 2018-02-01 LAB — URINALYSIS, ROUTINE W REFLEX MICROSCOPIC
Bilirubin Urine: NEGATIVE
Glucose, UA: NEGATIVE mg/dL
Hgb urine dipstick: NEGATIVE
Ketones, ur: 5 mg/dL — AB
Leukocytes, UA: NEGATIVE
Nitrite: NEGATIVE
Protein, ur: NEGATIVE mg/dL
Specific Gravity, Urine: 1.016 (ref 1.005–1.030)
pH: 5 (ref 5.0–8.0)

## 2018-02-01 LAB — TROPONIN I: Troponin I: <0.03 ng/mL (ref <0.03)

## 2018-02-01 LAB — I-STAT CG4 LACTIC ACID, ED: Lactic Acid, Venous: 0.88 mmol/L (ref 0.5–1.9)

## 2018-02-01 LAB — TSH: TSH: 7.376 u[IU]/mL — ABNORMAL HIGH (ref 0.350–4.500)

## 2018-02-01 LAB — CBG MONITORING, ED: Glucose-Capillary: 117 mg/dL — ABNORMAL HIGH (ref 65–99)

## 2018-02-01 MED ORDER — SODIUM CHLORIDE 0.9 % IV SOLN
INTRAVENOUS | Status: DC
  Administered 2018-02-01: 21:00:00 via INTRAVENOUS

## 2018-02-01 MED ORDER — SODIUM CHLORIDE 0.9 % IV BOLUS (SEPSIS)
2000.0000 mL | Freq: Once | INTRAVENOUS | Status: AC
  Administered 2018-02-01: 2000 mL via INTRAVENOUS

## 2018-02-01 MED ORDER — SODIUM CHLORIDE 0.9 % IV BOLUS (SEPSIS): 1000.0000 mL | Freq: Once | INTRAVENOUS | Status: DC

## 2018-02-01 NOTE — H&P (Signed)
History and Physical    [name] FTD:[id number] DOB: 1951-07-14 DOA: 02/01/2018  Referring MD/NP/PA: Dr. [name] PCP: Maurice Small, MD  Patient coming from: Home  Chief Complaint: Altered  I have personally briefly reviewed patient's old medical records in [place]   HPI: [name] is a 67 y.o. female with medical history significant of HTN, chronic pain, anxiety, depression, and hypothyroidism; who presents after being found to be altered and lethargic over the last 24 hours.  Patient's husband helps provide history as patient is currently unable to due to mental status change.  He states that she had been sleeping for the last 24 hours,  was really confused,  disoriented to where she was, and what was going on.  He initially thought symptoms were related to her taking Ambien last night.  However, he also reports concerned that the patient possibly took more of her medications of hydroxyzine or tizanidine.  At baseline he reports that she utilizes a Butrans patch and takes more ibuprofen than she should for chronic issues with back pain.  Furthermore, notes that patient normally drinks 3-4 glasses of wine daily, but reportedly have been cutting back over the last 1 week.  He does not report the patient being suicidal to his knowledge.  She was noted to have been sick with a cough sometime last week,but symptoms have not resolved in the last 3-4 days.  ED Course: Patient was noted to have relatively normal vital signs initially on admission.  Labs revealed normal CBC, creatinine 23, BUN 1.18, and lactic acid 0.88.  UA negative for any signs of a infection.  CT scan of the brain showed no acute abnormalities.  Review of Systems  Unable to perform ROS: Mental status change  Constitutional: Negative for chills and fever.    Past Medical History:  Diagnosis Date  . Anxiety   . Concussion 3/08   ICU x 3 days  . Depression   . Thyroid disease ?1994    Past  Surgical History:  Procedure Laterality Date  . BREAST SURGERY Right 4/99   breast biopsy, benign  . SPINAL FUSION  03/04/11   with ORIF     reports that  has never smoked. she has never used smokeless tobacco. She reports that she drinks about 2.5 oz of alcohol per week. She reports that she does not use drugs.  Allergies  Allergen Reactions  . Erythromycin Nausea Only and Rash    Family History  Problem Relation Age of Onset  . Thyroid disease Mother   . Dementia Mother   . Diabetes Father   . Stroke Father   . Hypertension Sister   . Diabetes Sister     Prior to Admission medications   Medication Sig Start Date End Date Taking? Authorizing Provider  buprenorphine (BUTRANS) 20 MCG/HR PTWK patch Place 20 mcg onto the skin once a week.   Yes [provider]  hydrOXYzine (VISTARIL) 50 MG capsule Take 50-100 mg by mouth every 6 (six) hours as needed for anxiety.   Yes [provider]  levothyroxine (SYNTHROID) 125 MCG tablet Take 1 tablet (125 mcg total) by mouth daily. One po qd 11/14/13  Yes [name], CNM  tiZANidine (ZANAFLEX) 4 MG tablet Take 4 mg by mouth every 8 (eight) hours as needed for muscle spasms.   Yes [provider]  amLODipine (NORVASC) 5 MG tablet Take 7.5 mg by mouth daily.    [provider]  busPIRone (  BUSPAR) 10 MG tablet Take 1 tablet by mouth 3 (three) times daily.    [provider]  cholecalciferol (VITAMIN D) 1000 units tablet Take 2,000 Units by mouth daily.    [provider]  escitalopram (LEXAPRO) 10 MG tablet Take 10 mg by mouth at bedtime.    [provider]  estrogen, conjugated,-medroxyprogesterone (PREMPRO) 0.3-1.5 MG per tablet TAKE 1 TABLET EACH DAY. Patient not taking: Reported on 07/11/2017 11/13/13   [name], FNP  LORazepam (ATIVAN) 0.5 MG tablet Take 1 tablet (0.5 mg total) by mouth every 8 (eight) hours as needed for anxiety. 07/11/17   Tegeler, [name], MD   Vitamin D, Ergocalciferol, (DRISDOL) 50000 UNITS CAPS capsule Take 1 capsule (50,000 Units total) by mouth every 7 (seven) days. Patient not taking: Reported on 07/11/2017 11/14/13   [name], FNP    Physical Exam:  Constitutional: Lethargic elderly lady not readily answering questioning appropriately Vitals:   02/01/18 2046 02/01/18 2049 02/01/18 2138 02/01/18 2309  BP:  101/64 (!) 102/59 (!) 117/58  Pulse:  69 61 69  Resp:  11 12 12   Temp: (!) 97.2 F (36.2 C)     TempSrc: Rectal     SpO2:  96% 92% 91%   Eyes: PERRL, lids and conjunctivae normal ENMT: Mucous membranes are moist. Posterior pharynx clear of any exudate or lesions.Normal dentition.  Neck: normal, supple, no masses, no thyromegaly Respiratory: Patient with some mild crackles noted to the lower lung fields.. Normal respiratory effort. No accessory muscle use.  Cardiovascular: Regular rate and rhythm, no murmurs / rubs / gallops.  Nonpitting bilateral lower extremity edema. 2+ pedal pulses. No carotid bruits.  Abdomen: no tenderness, no masses palpated. No hepatosplenomegaly. Bowel sounds positive.  Musculoskeletal: no clubbing / cyanosis. No joint deformity upper and lower extremities. Good ROM, no contractures. Normal muscle tone.  Skin: no rashes, lesions, ulcers. No induration Neurologic: CN 2-12 grossly intact. Sensation intact, DTR normal. Strength 5/5 in all 4.  Psychiatric: Lethargic oriented only to person.  Flat affect.     Labs on Admission: I have personally reviewed following labs and imaging studies  CBC: Recent Labs  Lab 02/01/18 1716  WBC 9.9  HGB 13.0  HCT 38.6  MCV 89.8  PLT 440   Basic Metabolic Panel: Recent Labs  Lab 02/01/18 1716  NA 140  K 3.5  CL 102  CO2 22  GLUCOSE 117*  BUN 23*  CREATININE 1.18*  CALCIUM 9.1   GFR: CrCl cannot be calculated (Unknown ideal weight.). Liver Function Tests: Recent Labs  Lab 02/01/18 1716  AST 18  ALT 13*  ALKPHOS 65  BILITOT  0.9  PROT 7.4  ALBUMIN 3.9   No results for input(s): LIPASE, AMYLASE in the last 168 hours. No results for input(s): AMMONIA in the last 168 hours. Coagulation Profile: No results for input(s): INR, PROTIME in the last 168 hours. Cardiac Enzymes: Recent Labs  Lab 02/01/18 2031  TROPONINI <0.03   BNP (last 3 results) No results for input(s): PROBNP in the last 8760 hours. HbA1C: No results for input(s): HGBA1C in the last 72 hours. CBG: Recent Labs  Lab 02/01/18 1715  GLUCAP 117*   Lipid Profile: No results for input(s): CHOL, HDL, LDLCALC, TRIG, CHOLHDL, LDLDIRECT in the last 72 hours. Thyroid Function Tests: Recent Labs    02/01/18 2031  TSH 7.376*   Anemia Panel: No results for input(s): VITAMINB12, FOLATE, FERRITIN, TIBC, IRON, RETICCTPCT in the last 72 hours. Urine analysis:  Component Value Date/Time   COLORURINE YELLOW 02/01/2018 2205   APPEARANCEUR HAZY (A) 02/01/2018 2205   LABSPEC 1.016 02/01/2018 2205   PHURINE 5.0 02/01/2018 2205   GLUCOSEU NEGATIVE 02/01/2018 2205   HGBUR NEGATIVE 02/01/2018 [place] 02/01/2018 2205   BILIRUBINUR neg 11/13/2013 1629   KETONESUR 5 (A) 02/01/2018 2205   PROTEINUR NEGATIVE 02/01/2018 2205   UROBILINOGEN negative 11/13/2013 1629   NITRITE NEGATIVE 02/01/2018 2205   LEUKOCYTESUR NEGATIVE 02/01/2018 2205   Sepsis Labs: No results found for this or any previous visit (from the past 240 hour(s)).   Radiological Exams on Admission: Ct Head Wo Contrast  Result Date: 02/01/2018 CLINICAL DATA:  Confusion over the past several days. EXAM: CT HEAD WITHOUT CONTRAST TECHNIQUE: Contiguous axial images were obtained from the base of the skull through the vertex without intravenous contrast. COMPARISON:  07/11/2017 FINDINGS: Brain: Chronic stable superficial atrophy. Chronic stable small vessel ischemic disease of periventricular white matter. No hydrocephalus. No large vascular territory infarct, acute  intracranial hemorrhage, midline shift or edema. No intra-axial mass nor extra-axial fluid collections. No effacement of the basal cisterns nor fourth ventricle. Vascular: Atherosclerosis of the carotid siphons bilaterally. No hyperdense vessels. Skull: No acute osseous abnormality of the skull. No suspicious osseous lesions. Sinuses/Orbits: Clear paranasal sinuses and mastoids. Intact orbits and globes. Right lens replacement. Other: None IMPRESSION: Chronic atrophy with small vessel ischemic disease. No acute intracranial abnormality. Electronically Signed   By: Ashley Royalty M.D.   On: 02/01/2018 22:39    EKG: Independently reviewed.  Normal sinus rhythm at 72 bpm with QTc 495  Assessment/Plan Acute metabolic encephalopathy: Husband gives concern for the possibility of overuse of alcohol, medications of hydroxyzine, tizanidine, and/or ibuprofen.  Symptoms could likely be multifactorial in nature. - Admit to a telemetry bed - Continuous pulse oximetry with nasal cannula oxygen as needed - N.p.o. until more awake - Neurochecks - Check UDS, alcohol , salicylate, acetaminophen levels - Check chest x-ray - Limit sedating medications - IV fluids of normal saline at 100 mL/h   Hypothyroidism: Patient does not also makes it known that they have had a difficult time in controlling her thyroid levels.  TSH noted to be 7.2. - Levothyroxine IV for now while patient altered change to p.o. when medically appropriate   Acute kidney injury: Patient's baseline creatinine previously noted to be around 0.6 to 0.8, but presents with a creatinine of 1.18 and BUN 23.  Suspect dehydration as a likely cause given elevated BUN to creatinine ratio. - Gentle IV fluids as tolerated  Weight gain: Husband reports weight gain of at least 10-20 pounds over last year.  Could be related to hypothyroidism vs CHF. - Check BNP - May warrant further heart evaluation  Chronic back pain: Patient reportedly takes ibuprofen and  has a transdermal Butrans patch for symptoms. - Discontinue Butrans patch until patient becomes more alert   Alcohol abuse: Patient husband reports her drinking 3 or more drinks per night on average of wine. - CWIA without scheduled Ativan due to lethargy  Prolonged QTc: Initial QTC noted to be 495 on admission. - Recheck QTC in a.m.  Anxiety/depression: Patient not known to be suicidal. - Restart Lexapro and buspirone when medically appropriate  Essential hypertension: Stable - Restart amlodipine when medically appropriate    DVT prophylaxis: lovenox Code Status: full Family Communication: Discussed plan of care with the patient and family present at bedside Disposition Plan: To be determined Consults called: none Admission status:  observation  Norval Morton MD [facility] Pager [phone]   If 7PM-7AM, please contact night-coverage [email or web address] Password [facility]  02/01/2018, 11:25 PM

## 2018-02-01 NOTE — ED Provider Notes (Signed)
[name] Note   CSN: [id number] Arrival date & time: 02/01/18  1656     History   Chief Complaint Chief Complaint  Patient presents with  . Altered Mental Status    HPI [name] is a 67 y.o. female.  66 year old female with history of hypothyroidism presents with altered mental status times 1 day.  Some concern that she may have taken too much of her hydroxyzine and tizanidine.  No reported fever, vomiting,.  No concern for suicide attempt.  No cough or congestion.  No prior history of same.  No treatment used for this prior to arrival      Past Medical History:  Diagnosis Date  . Anxiety   . Concussion 3/08   ICU x 3 days  . Depression   . Thyroid disease ?1994    There are no active problems to display for this patient.   Past Surgical History:  Procedure Laterality Date  . BREAST SURGERY Right 4/99   breast biopsy, benign  . SPINAL FUSION  03/04/11   with ORIF    OB History    Gravida Para Term Preterm AB Living   1 1 1     1    SAB TAB Ectopic Multiple Live Births                   Home Medications    Prior to Admission medications   Medication Sig Start Date End Date Taking? Authorizing Provider  amLODipine (NORVASC) 5 MG tablet Take 7.5 mg by mouth daily.    [provider]  buprenorphine (BUTRANS) 20 MCG/HR PTWK patch Place 20 mcg onto the skin once a week.    [provider]  busPIRone (BUSPAR) 10 MG tablet Take 1 tablet by mouth 3 (three) times daily.    [provider]  cholecalciferol (VITAMIN D) 1000 units tablet Take 2,000 Units by mouth daily.    [provider]  escitalopram (LEXAPRO) 10 MG tablet Take 10 mg by mouth at bedtime.    [provider]  estrogen, conjugated,-medroxyprogesterone (PREMPRO) 0.3-1.5 MG per tablet TAKE 1 TABLET EACH DAY. Patient not taking: Reported on 07/11/2017 11/13/13   [name], FNP  levothyroxine  (SYNTHROID) 125 MCG tablet Take 1 tablet (125 mcg total) by mouth daily. One po qd 11/14/13   [name], CNM  LORazepam (ATIVAN) 0.5 MG tablet Take 1 tablet (0.5 mg total) by mouth every 8 (eight) hours as needed for anxiety. 07/11/17   Tegeler, [name], MD  Vitamin D, Ergocalciferol, (DRISDOL) 50000 UNITS CAPS capsule Take 1 capsule (50,000 Units total) by mouth every 7 (seven) days. Patient not taking: Reported on 07/11/2017 11/14/13   [name], FNP    Family History Family History  Problem Relation Age of Onset  . Thyroid disease Mother   . Dementia Mother   . Diabetes Father   . Stroke Father   . Hypertension Sister   . Diabetes Sister     Social History Social History   Tobacco Use  . Smoking status: Never Smoker  . Smokeless tobacco: Never Used  Substance Use Topics  . Alcohol use: Yes    Alcohol/week: 2.5 oz    Types: 5 Standard drinks or equivalent per week  . Drug use: No     Allergies   Erythromycin   Review of Systems Review of Systems  All other systems reviewed and are negative.    Physical Exam Updated Vital Signs  BP 109/71 (BP Location: Right Arm)   Pulse 76   Temp (!) 97.5 F (36.4 C) (Oral)   Resp 15   SpO2 95%   Physical Exam  Constitutional: She is oriented to person, place, and time. She appears well-developed and well-nourished. She appears lethargic.  Non-toxic appearance. No distress.  HENT:  Head: Normocephalic and atraumatic.  Eyes: Conjunctivae, EOM and lids are normal. Pupils are equal, round, and reactive to light.  Neck: Normal range of motion. Neck supple. No tracheal deviation present. No thyroid mass present.  Cardiovascular: Normal rate, regular rhythm and normal heart sounds. Exam reveals no gallop.  No murmur heard. Pulmonary/Chest: Effort normal and breath sounds normal. No stridor. No respiratory distress. She has no decreased breath sounds. She has no wheezes. She has no rhonchi. She has no rales.    Abdominal: Soft. Normal appearance and bowel sounds are normal. She exhibits no distension. There is no tenderness. There is no rebound and no CVA tenderness.  Musculoskeletal: Normal range of motion. She exhibits no edema or tenderness.  Neurological: She is oriented to person, place, and time. She appears lethargic. She displays no tremor. No cranial nerve deficit or sensory deficit. She exhibits normal muscle tone. She displays a negative Romberg sign. GCS eye subscore is 4. GCS verbal subscore is 5. GCS motor subscore is 6.  Skin: Skin is warm and dry. No abrasion and no rash noted.  Psychiatric: Her affect is blunt. Her speech is delayed. She is slowed. She is inattentive.  Nursing note and vitals reviewed.    ED Treatments / Results  Labs (all labs ordered are listed, but only abnormal results are displayed) Labs Reviewed  COMPREHENSIVE METABOLIC PANEL - Abnormal; Notable for the following components:      Result Value   Glucose, Bld 117 (*)    BUN 23 (*)    Creatinine, Ser 1.18 (*)    ALT 13 (*)    GFR calc non Af Amer 46 (*)    GFR calc Af Amer 54 (*)    Anion gap 16 (*)    All other components within normal limits  CBG MONITORING, ED - Abnormal; Notable for the following components:   Glucose-Capillary 117 (*)    All other components within normal limits  URINE CULTURE  CBC  URINALYSIS, ROUTINE W REFLEX MICROSCOPIC  TROPONIN I  TSH  I-STAT CG4 LACTIC ACID, ED    EKG  EKG Interpretation None       Radiology No results found.  Procedures Procedures (including critical care time)  Medications Ordered in ED Medications  0.9 %  sodium chloride infusion (not administered)  sodium chloride 0.9 % bolus 2,000 mL (not administered)     Initial Impression / Assessment and Plan / ED Course  I have reviewed the triage vital signs and the nursing notes.  Pertinent labs & imaging results that were available during my care of the patient were reviewed by me and  considered in my medical decision making (see chart for details).     Pt given iv fluids and blood pressure improved Head ct neg tsh elevated and suspect hypothyroidism may be contributing to her sx Will admit to medicine   Final Clinical Impressions(s) / ED Diagnoses   Final diagnoses:  None    ED Discharge Orders    None       [name], MD 02/03/18 1619

## 2018-02-01 NOTE — ED Triage Notes (Signed)
Patient having confusion that has increased over the past couple days. Pt was talking about bon fires in front yard and people out there this morning. Patient did take Ambien last night and family thought that would be cause but this time of day still having confusion.

## 2018-02-02 ENCOUNTER — Observation Stay (HOSPITAL_COMMUNITY): Payer: Medicare Other

## 2018-02-02 DIAGNOSIS — N179 Acute kidney failure, unspecified: Secondary | ICD-10-CM | POA: Diagnosis present

## 2018-02-02 DIAGNOSIS — I10 Essential (primary) hypertension: Secondary | ICD-10-CM | POA: Diagnosis present

## 2018-02-02 DIAGNOSIS — E039 Hypothyroidism, unspecified: Secondary | ICD-10-CM | POA: Diagnosis present

## 2018-02-02 DIAGNOSIS — G9340 Encephalopathy, unspecified: Secondary | ICD-10-CM | POA: Diagnosis present

## 2018-02-02 DIAGNOSIS — G8929 Other chronic pain: Secondary | ICD-10-CM | POA: Diagnosis present

## 2018-02-02 DIAGNOSIS — F1010 Alcohol abuse, uncomplicated: Secondary | ICD-10-CM | POA: Diagnosis present

## 2018-02-02 DIAGNOSIS — R635 Abnormal weight gain: Secondary | ICD-10-CM | POA: Diagnosis present

## 2018-02-02 DIAGNOSIS — M549 Dorsalgia, unspecified: Secondary | ICD-10-CM

## 2018-02-02 LAB — RAPID URINE DRUG SCREEN, HOSP PERFORMED
Amphetamines: NOT DETECTED
Barbiturates: NOT DETECTED
Benzodiazepines: POSITIVE — AB
Cocaine: NOT DETECTED
Opiates: NOT DETECTED
Tetrahydrocannabinol: NOT DETECTED

## 2018-02-02 LAB — BRAIN NATRIURETIC PEPTIDE: B Natriuretic Peptide: 86 pg/mL (ref 0.0–100.0)

## 2018-02-02 LAB — CBC
HCT: 32.5 % — ABNORMAL LOW (ref 36.0–46.0)
Hemoglobin: 10.8 g/dL — ABNORMAL LOW (ref 12.0–15.0)
MCH: 30.4 pg (ref 26.0–34.0)
MCHC: 33.2 g/dL (ref 30.0–36.0)
MCV: 91.5 fL (ref 78.0–100.0)
Platelets: 203 10*3/uL (ref 150–400)
RBC: 3.55 MIL/uL — ABNORMAL LOW (ref 3.87–5.11)
RDW: 14.3 % (ref 11.5–15.5)
WBC: 6 10*3/uL (ref 4.0–10.5)

## 2018-02-02 LAB — BASIC METABOLIC PANEL
Anion gap: 8 (ref 5–15)
BUN: 19 mg/dL (ref 6–20)
CO2: 23 mmol/L (ref 22–32)
Calcium: 7.7 mg/dL — ABNORMAL LOW (ref 8.9–10.3)
Chloride: 112 mmol/L — ABNORMAL HIGH (ref 101–111)
Creatinine, Ser: 0.95 mg/dL (ref 0.44–1.00)
GFR calc Af Amer: >60 mL/min (ref >60)
GFR calc non Af Amer: >60 mL/min (ref >60)
Glucose, Bld: 111 mg/dL — ABNORMAL HIGH (ref 65–99)
Potassium: 3.6 mmol/L (ref 3.5–5.1)
Sodium: 143 mmol/L (ref 135–145)

## 2018-02-02 LAB — ETHANOL: Alcohol, Ethyl (B): <10 mg/dL (ref <10)

## 2018-02-02 LAB — T4, FREE: Free T4: 1.18 ng/dL — ABNORMAL HIGH (ref 0.61–1.12)

## 2018-02-02 LAB — ACETAMINOPHEN LEVEL: Acetaminophen (Tylenol), Serum: <10 ug/mL — ABNORMAL LOW (ref 10–30)

## 2018-02-02 LAB — SALICYLATE LEVEL: Salicylate Lvl: <7 mg/dL (ref 2.8–30.0)

## 2018-02-02 MED ORDER — LORAZEPAM 2 MG/ML IJ SOLN: 1.0000 mg | Freq: Four times a day (QID) | INTRAMUSCULAR | Status: DC | PRN

## 2018-02-02 MED ORDER — ADULT MULTIVITAMIN W/MINERALS CH
1.0000 | ORAL_TABLET | Freq: Every day | ORAL | Status: DC
  Administered 2018-02-02: 1 via ORAL
  Filled 2018-02-02: qty 1

## 2018-02-02 MED ORDER — PANTOPRAZOLE SODIUM 40 MG IV SOLR
40.0000 mg | Freq: Once | INTRAVENOUS | Status: AC
  Administered 2018-02-02: 40 mg via INTRAVENOUS
  Filled 2018-02-02: qty 40

## 2018-02-02 MED ORDER — LORAZEPAM 1 MG PO TABS
1.0000 mg | ORAL_TABLET | Freq: Four times a day (QID) | ORAL | Status: DC | PRN
  Administered 2018-02-02: 1 mg via ORAL
  Filled 2018-02-02: qty 1

## 2018-02-02 MED ORDER — ENOXAPARIN SODIUM 40 MG/0.4ML ~~LOC~~ SOLN
40.0000 mg | Freq: Every day | SUBCUTANEOUS | Status: DC
  Administered 2018-02-02: 40 mg via SUBCUTANEOUS
  Filled 2018-02-02: qty 0.4

## 2018-02-02 MED ORDER — FOLIC ACID 1 MG PO TABS
1.0000 mg | ORAL_TABLET | Freq: Every day | ORAL | Status: DC
  Administered 2018-02-02: 1 mg via ORAL
  Filled 2018-02-02: qty 1

## 2018-02-02 MED ORDER — ACETAMINOPHEN 325 MG PO TABS: 650.0000 mg | ORAL_TABLET | Freq: Four times a day (QID) | ORAL | Status: DC | PRN

## 2018-02-02 MED ORDER — ACETAMINOPHEN 650 MG RE SUPP: 650.0000 mg | Freq: Four times a day (QID) | RECTAL | Status: DC | PRN

## 2018-02-02 MED ORDER — THIAMINE HCL 100 MG/ML IJ SOLN: 100.0000 mg | Freq: Every day | INTRAMUSCULAR | Status: DC

## 2018-02-02 MED ORDER — VITAMIN B-1 100 MG PO TABS
100.0000 mg | ORAL_TABLET | Freq: Every day | ORAL | Status: DC
  Administered 2018-02-02: 100 mg via ORAL
  Filled 2018-02-02: qty 1

## 2018-02-02 MED ORDER — LEVOTHYROXINE SODIUM 100 MCG IV SOLR
65.0000 ug | Freq: Every day | INTRAVENOUS | Status: DC
  Administered 2018-02-02: 65 ug via INTRAVENOUS
  Filled 2018-02-02: qty 5

## 2018-02-02 NOTE — Progress Notes (Signed)
Unable to complete admission, patient not able to give information.l

## 2018-02-02 NOTE — Discharge Summary (Signed)
Physician Discharge Summary  [name] TIW:[id number] DOB: 01-20-1951 DOA: 02/01/2018  PCP: Maurice Small, MD  Admit date: 02/01/2018 Discharge date: 02/02/2018  Time spent: > 36 minutes  Recommendations for Outpatient Follow-up:  Pt presented with metabolic encephalopathy secondary to polypharmacy: ativan, xanax, tizanidine, opiod patch. I have discontinued tizanidine xanax and ativan.    Discharge Diagnoses:  Principal Problem:   Encephalopathy Active Problems:   Hypothyroidism   AKI (acute kidney injury) ([place])   Chronic back pain   Alcohol abuse   Weight gain   Benign essential HTN   Discharge Condition: stable  Diet recommendation: heart healthy  Filed Weights   02/02/18 0336  Weight: 97 kg (213 lb 13.5 oz)    History of present illness:  67 y.o. female with medical history significant of HTN, chronic pain, anxiety, depression, and hypothyroidism; who presents after being found to be altered and lethargic over the last 24 hours  Hospital Course:  Metabolic encephalopathy - most likely secondary to polypharmacy - Recommend discontinuation of benzodiazepines and muscle relaxant - Pt is alert and oriented x 4 to person, place, time, and president. Pt wishes to go home  Procedures:  None  Consultations:  None  Discharge Exam: Vitals:   02/02/18 0336 02/02/18 0618  BP: 136/81 138/80  Pulse: 63 78  Resp: 18 16  Temp: 97.7 F (36.5 C) 97.7 F (36.5 C)  SpO2: 100% 99%    General: Pt in nad, alert and awake Cardiovascular: rrr, no rubs Respiratory: no increased wob, no wheezes  Discharge Instructions   Discharge Instructions    Call MD for:  temperature >100.4   Complete by:  As directed    Diet - low sodium heart healthy   Complete by:  As directed    Discharge instructions   Complete by:  As directed    Please follow up with your primary care physician in the next 1 week to discuss treatment options for your anxiety.   Increase activity  slowly   Complete by:  As directed      Allergies as of 02/02/2018      Reactions   Ambien [zolpidem Tartrate] Other (See Comments)   Hallucinations and Confusion      Medication List    STOP taking these medications   ALPRAZolam 1 MG tablet Commonly known as:  XANAX   LORazepam 0.5 MG tablet Commonly known as:  ATIVAN   tiZANidine 4 MG tablet Commonly known as:  ZANAFLEX     TAKE these medications   amLODipine 5 MG tablet Commonly known as:  NORVASC Take 7.5 mg by mouth daily.   busPIRone 10 MG tablet Commonly known as:  BUSPAR Take 1 tablet by mouth 3 (three) times daily.   BUTRANS 20 MCG/HR Ptwk patch Generic drug:  buprenorphine Place 20 mcg onto the skin once a week.   cholecalciferol 1000 units tablet Commonly known as:  VITAMIN D Take 2,000 Units by mouth daily.   FLUoxetine 40 MG capsule Commonly known as:  PROZAC Take 40 mg by mouth daily.   hydrOXYzine 50 MG capsule Commonly known as:  VISTARIL Take 50-100 mg by mouth every 6 (six) hours as needed for anxiety.   ibuprofen 200 MG tablet Commonly known as:  ADVIL,MOTRIN Take 800 mg by mouth 2 (two) times daily as needed for moderate pain.   levothyroxine 137 MCG tablet Commonly known as:  SYNTHROID, LEVOTHROID Take 137 mcg by mouth daily before breakfast.   VITAMIN B 12 PO Take  1 tablet by mouth daily.      Allergies  Allergen Reactions  . Ambien [Zolpidem Tartrate] Other (See Comments)    Hallucinations and Confusion      The results of significant diagnostics from this hospitalization (including imaging, microbiology, ancillary and laboratory) are listed below for reference.    Significant Diagnostic Studies: Ct Head Wo Contrast  Result Date: 02/01/2018 CLINICAL DATA:  Confusion over the past several days. EXAM: CT HEAD WITHOUT CONTRAST TECHNIQUE: Contiguous axial images were obtained from the base of the skull through the vertex without intravenous contrast. COMPARISON:  07/11/2017  FINDINGS: Brain: Chronic stable superficial atrophy. Chronic stable small vessel ischemic disease of periventricular white matter. No hydrocephalus. No large vascular territory infarct, acute intracranial hemorrhage, midline shift or edema. No intra-axial mass nor extra-axial fluid collections. No effacement of the basal cisterns nor fourth ventricle. Vascular: Atherosclerosis of the carotid siphons bilaterally. No hyperdense vessels. Skull: No acute osseous abnormality of the skull. No suspicious osseous lesions. Sinuses/Orbits: Clear paranasal sinuses and mastoids. Intact orbits and globes. Right lens replacement. Other: None IMPRESSION: Chronic atrophy with small vessel ischemic disease. No acute intracranial abnormality. Electronically Signed   By: Ashley Royalty M.D.   On: 02/01/2018 22:39   Dg Chest Port 1 View  Result Date: 02/02/2018 CLINICAL DATA:  Acute encephalopathy. EXAM: PORTABLE CHEST 1 VIEW COMPARISON:  Frontal and lateral views 07/11/2017 FINDINGS: Low lung volumes. Mild cardiomegaly. Tortuous thoracic aorta. Perivascular haziness suggesting pulmonary edema. Streaky left lung base atelectasis. No confluent consolidation. No large pleural effusion or pneumothorax. No acute osseous abnormalities. IMPRESSION: Mild cardiomegaly. Perivascular haziness suggests mild pulmonary edema. Electronically Signed   By: Jeb Levering M.D.   On: 02/02/2018 02:16    Microbiology: No results found for this or any previous visit (from the past 240 hour(s)).   Labs: Basic Metabolic Panel: Recent Labs  Lab 02/01/18 1716 02/02/18 0242  NA 140 143  K 3.5 3.6  CL 102 112*  CO2 22 23  GLUCOSE 117* 111*  BUN 23* 19  CREATININE 1.18* 0.95  CALCIUM 9.1 7.7*   Liver Function Tests: Recent Labs  Lab 02/01/18 1716  AST 18  ALT 13*  ALKPHOS 65  BILITOT 0.9  PROT 7.4  ALBUMIN 3.9   No results for input(s): LIPASE, AMYLASE in the last 168 hours. No results for input(s): AMMONIA in the last 168  hours. CBC: Recent Labs  Lab 02/01/18 1716 02/02/18 0242  WBC 9.9 6.0  HGB 13.0 10.8*  HCT 38.6 32.5*  MCV 89.8 91.5  PLT 253 203   Cardiac Enzymes: Recent Labs  Lab 02/01/18 2031  TROPONINI <0.03   BNP: BNP (last 3 results) Recent Labs    02/02/18 0242  BNP 86.0    ProBNP (last 3 results) No results for input(s): PROBNP in the last 8760 hours.  CBG: Recent Labs  Lab 02/01/18 1715  GLUCAP 117*       Signed:  Velvet Bathe MD.  [facility] 02/02/2018, 12:13 PM

## 2018-02-02 NOTE — ED Notes (Signed)
[name]: [phone]

## 2018-02-02 NOTE — ED Notes (Signed)
ED TO INPATIENT HANDOFF REPORT  Name/Age/Gender [name] 67 y.o. female  Code Status    Code Status Orders  (From admission, onward)        Start     Ordered   02/02/18 0126  Full code  Continuous     02/02/18 0134    Code Status History    Date Active Date Inactive Code Status Order ID Comments User Context   This patient has a current code status but no historical code status.      Home/SNF/Other Home  Chief Complaint AMS  Level of Care/Admitting Diagnosis ED Disposition    ED Disposition Condition Comment   Admit  Hospital Area: [facility] [[id number]]  Level of Care: Telemetry [5]  Admit to tele based on following criteria: Complex arrhythmia (Bradycardia/Tachycardia)  Admit to tele based on following criteria: Monitor QTC interval  Diagnosis: Encephalopathy [389373]  Admitting Physician: Norval Morton [[id number]]  Attending Physician: Norval Morton [[id number]]  PT Class (Do Not Modify): Observation [104]  PT Acc Code (Do Not Modify): Observation [10022]       Medical History Past Medical History:  Diagnosis Date  . Anxiety   . Concussion 3/08   ICU x 3 days  . Depression   . Thyroid disease ?1994    Allergies Allergies  Allergen Reactions  . Erythromycin Nausea Only and Rash    IV Location/Drains/Wounds Patient Lines/Drains/Airways Status   Active Line/Drains/Airways    Name:   Placement date:   Placement time:   Site:   Days:   Peripheral IV 02/01/18 Right Forearm   02/01/18    2040    Forearm   1          Labs/Imaging Results for orders placed or performed during the hospital encounter of 02/01/18 (from the past 48 hour(s))  CBG monitoring, ED     Status: Abnormal   Collection Time: 02/01/18  5:15 PM  Result Value Ref Range   Glucose-Capillary 117 (H) 65 - 99 mg/dL  Comprehensive metabolic panel     Status: Abnormal   Collection Time: 02/01/18  5:16 PM  Result Value Ref Range   Sodium 140 135 - 145  mmol/L   Potassium 3.5 3.5 - 5.1 mmol/L   Chloride 102 101 - 111 mmol/L   CO2 22 22 - 32 mmol/L   Glucose, Bld 117 (H) 65 - 99 mg/dL   BUN 23 (H) 6 - 20 mg/dL   Creatinine, Ser 1.18 (H) 0.44 - 1.00 mg/dL   Calcium 9.1 8.9 - 10.3 mg/dL   Total Protein 7.4 6.5 - 8.1 g/dL   Albumin 3.9 3.5 - 5.0 g/dL   AST 18 15 - 41 U/L   ALT 13 (L) 14 - 54 U/L   Alkaline Phosphatase 65 38 - 126 U/L   Total Bilirubin 0.9 0.3 - 1.2 mg/dL   GFR calc non Af Amer 46 (L) >60 mL/min   GFR calc Af Amer 54 (L) >60 mL/min    Comment: (NOTE) The eGFR has been calculated using the CKD EPI equation. This calculation has not been validated in all clinical situations. eGFR's persistently <60 mL/min signify possible Chronic Kidney Disease.    Anion gap 16 (H) 5 - 15    Comment: Performed at [facility], [place] [address] [place], [place] [address]  CBC     Status: None   Collection Time: 02/01/18  5:16 PM  Result Value Ref Range   WBC  9.9 4.0 - 10.5 K/uL   RBC 4.30 3.87 - 5.11 MIL/uL   Hemoglobin 13.0 12.0 - 15.0 g/dL   HCT 38.6 36.0 - 46.0 %   MCV 89.8 78.0 - 100.0 fL   MCH 30.2 26.0 - 34.0 pg   MCHC 33.7 30.0 - 36.0 g/dL   RDW 14.2 11.5 - 15.5 %   Platelets 253 150 - 400 K/uL    Comment: Performed at [facility], [place] [address] [place], [place] [address]  Troponin I     Status: None   Collection Time: 02/01/18  8:31 PM  Result Value Ref Range   Troponin I <0.03 <0.03 ng/mL    Comment: Performed at [facility], [place] [address], [place], [place] [address]  TSH     Status: Abnormal   Collection Time: 02/01/18  8:31 PM  Result Value Ref Range   TSH 7.376 (H) 0.350 - 4.500 uIU/mL    Comment: Performed by a 3rd Generation assay with a functional sensitivity of <=0.01 uIU/mL. Performed at [facility], [place] [address], [place], [place] [address]   I-Stat CG4 Lactic Acid, ED     Status: None   Collection Time: 02/01/18  8:47 PM   Result Value Ref Range   Lactic Acid, Venous 0.88 0.5 - 1.9 mmol/L  Urinalysis, Routine w reflex microscopic     Status: Abnormal   Collection Time: 02/01/18 10:05 PM  Result Value Ref Range   Color, Urine YELLOW YELLOW   APPearance HAZY (A) CLEAR   Specific Gravity, Urine 1.016 1.005 - 1.030   pH 5.0 5.0 - 8.0   Glucose, UA NEGATIVE NEGATIVE mg/dL   Hgb urine dipstick NEGATIVE NEGATIVE   Bilirubin Urine NEGATIVE NEGATIVE   Ketones, ur 5 (A) NEGATIVE mg/dL   Protein, ur NEGATIVE NEGATIVE mg/dL   Nitrite NEGATIVE NEGATIVE   Leukocytes, UA NEGATIVE NEGATIVE    Comment: Performed at [facility] [address] [place], [place] [address]   Ct Head Wo Contrast  Result Date: 02/01/2018 CLINICAL DATA:  Confusion over the past several days. EXAM: CT HEAD WITHOUT CONTRAST TECHNIQUE: Contiguous axial images were obtained from the base of the skull through the vertex without intravenous contrast. COMPARISON:  07/11/2017 FINDINGS: Brain: Chronic stable superficial atrophy. Chronic stable small vessel ischemic disease of periventricular white matter. No hydrocephalus. No large vascular territory infarct, acute intracranial hemorrhage, midline shift or edema. No intra-axial mass nor extra-axial fluid collections. No effacement of the basal cisterns nor fourth ventricle. Vascular: Atherosclerosis of the carotid siphons bilaterally. No hyperdense vessels. Skull: No acute osseous abnormality of the skull. No suspicious osseous lesions. Sinuses/Orbits: Clear paranasal sinuses and mastoids. Intact orbits and globes. Right lens replacement. Other: None IMPRESSION: Chronic atrophy with small vessel ischemic disease. No acute intracranial abnormality. Electronically Signed   By: Ashley Royalty M.D.   On: 02/01/2018 22:39    Pending Labs Unresulted Labs (From admission, onward)   Start     Ordered   02/02/18 0500  CBC  Tomorrow morning,   R     02/02/18 0134   02/02/18 7253  Basic metabolic  panel  Tomorrow morning,   R     02/02/18 0134   66/44/03 4742  Salicylate level  Add-on,   R     02/02/18 0134   02/02/18 0125  Acetaminophen level  Add-on,   R     02/02/18 0134   02/02/18 0124  Urine rapid drug screen (  hosp performed)  Add-on,   R     02/02/18 0134   02/02/18 0124  Ethanol  Add-on,   R     02/02/18 0134   02/01/18 2325  T4, free  Add-on,   R     02/01/18 2324   02/01/18 1839  Urine Culture  STAT,   STAT     02/01/18 1838      Vitals/Pain Today's Vitals   02/01/18 2309 02/01/18 2315 02/02/18 0000 02/02/18 0045  BP: (!) 117/58 117/63 127/88 113/65  Pulse: 69 69 72 71  Resp: '12 13 13 12  ' Temp:      TempSrc:      SpO2: 91% (!) 89% (!) 81% 92%    Isolation Precautions No active isolations  Medications Medications  0.9 %  sodium chloride infusion ( Intravenous New Bag/Given 02/01/18 2047)  enoxaparin (LOVENOX) injection 40 mg (not administered)  levothyroxine (SYNTHROID, LEVOTHROID) injection 65 mcg (not administered)  acetaminophen (TYLENOL) tablet 650 mg (not administered)    Or  acetaminophen (TYLENOL) suppository 650 mg (not administered)  LORazepam (ATIVAN) tablet 1 mg (not administered)    Or  LORazepam (ATIVAN) injection 1 mg (not administered)  thiamine (VITAMIN B-1) tablet 100 mg (not administered)    Or  thiamine (B-1) injection 100 mg (not administered)  folic acid (FOLVITE) tablet 1 mg (not administered)  multivitamin with minerals tablet 1 tablet (not administered)  sodium chloride 0.9 % bolus 2,000 mL (0 mLs Intravenous Stopped 02/01/18 2314)    Mobility non-ambulatory

## 2018-02-03 LAB — URINE CULTURE: Culture: NO GROWTH

## 2019-11-30 ENCOUNTER — Other Ambulatory Visit: Payer: Self-pay | Admitting: Family Medicine

## 2019-11-30 DIAGNOSIS — N63 Unspecified lump in unspecified breast: Secondary | ICD-10-CM

## 2019-12-12 ENCOUNTER — Other Ambulatory Visit: Payer: Self-pay

## 2019-12-12 ENCOUNTER — Other Ambulatory Visit: Payer: Self-pay | Admitting: Family Medicine

## 2019-12-12 ENCOUNTER — Ambulatory Visit
Admission: RE | Admit: 2019-12-12 | Discharge: 2019-12-12 | Disposition: A | Discharge status: Home / Self Care | Payer: Medicare Other | Source: Ambulatory Visit | Attending: Family Medicine | Admitting: Family Medicine

## 2019-12-12 DIAGNOSIS — N63 Unspecified lump in unspecified breast: Secondary | ICD-10-CM

## 2019-12-12 DIAGNOSIS — R921 Mammographic calcification found on diagnostic imaging of breast: Secondary | ICD-10-CM

## 2019-12-12 DIAGNOSIS — N6310 Unspecified lump in the right breast, unspecified quadrant: Secondary | ICD-10-CM

## 2019-12-20 ENCOUNTER — Ambulatory Visit
Admission: RE | Admit: 2019-12-20 | Discharge: 2019-12-20 | Disposition: A | Discharge status: Home / Self Care | Payer: Medicare Other | Source: Ambulatory Visit | Attending: Family Medicine | Admitting: Family Medicine

## 2019-12-20 ENCOUNTER — Other Ambulatory Visit: Payer: Self-pay

## 2019-12-20 DIAGNOSIS — R921 Mammographic calcification found on diagnostic imaging of breast: Secondary | ICD-10-CM

## 2019-12-20 DIAGNOSIS — N6310 Unspecified lump in the right breast, unspecified quadrant: Secondary | ICD-10-CM

## 2019-12-20 HISTORY — PX: BREAST BIOPSY: SHX20

## 2019-12-26 ENCOUNTER — Other Ambulatory Visit: Payer: Self-pay | Admitting: *Deleted

## 2019-12-26 ENCOUNTER — Encounter: Payer: Self-pay | Admitting: *Deleted

## 2019-12-26 DIAGNOSIS — Z170 Estrogen receptor positive status [ER+]: Secondary | ICD-10-CM | POA: Insufficient documentation

## 2019-12-26 DIAGNOSIS — C50211 Malignant neoplasm of upper-inner quadrant of right female breast: Secondary | ICD-10-CM

## 2020-01-01 NOTE — Progress Notes (Signed)
[facility] NOTE  Patient Care Team: Maurice Small, MD as PCP - General (Family Medicine) Rockwell Germany, RN as Oncology Nurse Navigator Tressie Ellis, Paulette Blanch, RN as Oncology Nurse Navigator Erroll Luna, MD as Consulting Physician (General Surgery) Nicholas Lose, MD as Consulting Physician (Hematology and Oncology) Gery Pray, MD as Consulting Physician (Radiation Oncology)  CHIEF COMPLAINTS/PURPOSE OF CONSULTATION:  Newly diagnosed breast cancer  HISTORY OF PRESENTING ILLNESS:  [name] 69 y.o. female is here because of recent diagnosis of invasive ductal carcinoma of the right breast. The patient palpated a right breast lump for 1 week. Diagnostic mammogram and [place] on 12/12/19 showed two adjacent masses at the 2 o'clock position measuring 1.4cm and 0.6cm, calcifications in the outer right breast at the 9 o'clock position, and two lymph nodes at the 10 o'clock position, with no right axillary adenopathy. Biopsy on 12/19/20 showed invasive ductal carcinoma at the 2 o'clock position, grade 3, HER-2 positive (3+), ER+ 90%, PR+ 30%, Ki67 30%, and ductal carcinoma in situ in the upper outer right breast, high grade, ER+ 95%, PR 90%. She presents to the clinic today for initial evaluation and discussion of treatment options.   I reviewed her records extensively and collaborated the history with the patient.  SUMMARY OF ONCOLOGIC HISTORY: Oncology History  Malignant neoplasm of upper-inner quadrant of right breast in female, estrogen receptor positive ([place])  12/26/2019 Initial Diagnosis   Patient palpated a right breast lump x1wk. Mammogram and US showed two adjacent masses at the 2 o'clock position measuring 1.4cm and 0.6cm, calcifications in the outer right breast at the 9 o'clock position, no right axillary adenopathy. Biopsy showed IDC at the 2 o'clock position, grade 3, HER-2 + (3+), ER+ 90%, PR+ 30%, Ki67 30%, and DCIS in the upper outer right breast, high grade,  ER+ 95%, PR 90%.     MEDICAL HISTORY:  Past Medical History:  Diagnosis Date  . Anxiety   . Concussion 3/08   ICU x 3 days  . Depression   . Hypertension   . Spinal stenosis   . Thyroid disease ?1994    SURGICAL HISTORY: Past Surgical History:  Procedure Laterality Date  . BREAST SURGERY Right 4/99   breast biopsy, benign  . SPINAL FUSION  03/04/11   with ORIF    SOCIAL HISTORY: Social History   Socioeconomic History  . Marital status: Married    Spouse name: Not on file  . Number of children: Not on file  . Years of education: Not on file  . Highest education level: Not on file  Occupational History  . Not on file  Tobacco Use  . Smoking status: Former Research scientist (life sciences)  . Smokeless tobacco: Never Used  Substance and Sexual Activity  . Alcohol use: Yes    Alcohol/week: 5.0 standard drinks    Types: 5 Standard drinks or equivalent per week  . Drug use: No  . Sexual activity: Never    Partners: Male  Other Topics Concern  . Not on file  Social History Narrative  . Not on file   Social Determinants of Health   Financial Resource Strain:   . Difficulty of Paying Living Expenses: Not on file  Food Insecurity:   . Worried About Charity fundraiser in the Last Year: Not on file  . Ran Out of Food in the Last Year: Not on file  Transportation Needs:   . Lack of Transportation (Medical): Not on file  . Lack of Transportation (  Non-Medical): Not on file  Physical Activity:   . Days of Exercise per Week: Not on file  . Minutes of Exercise per Session: Not on file  Stress:   . Feeling of Stress : Not on file  Social Connections:   . Frequency of Communication with Friends and Family: Not on file  . Frequency of Social Gatherings with Friends and Family: Not on file  . Attends Religious Services: Not on file  . Active Member of Clubs or Organizations: Not on file  . Attends Archivist Meetings: Not on file  . Marital Status: Not on file  Intimate Partner  Violence:   . Fear of Current or Ex-Partner: Not on file  . Emotionally Abused: Not on file  . Physically Abused: Not on file  . Sexually Abused: Not on file    FAMILY HISTORY: Family History  Problem Relation Age of Onset  . Thyroid disease Mother   . Dementia Mother   . Diabetes Father   . Stroke Father   . Hypertension Sister   . Diabetes Sister     ALLERGIES:  is allergic to [employer] tartrate].  MEDICATIONS:  Current Outpatient Medications  Medication Sig Dispense Refill  . amitriptyline (ELAVIL) 25 MG tablet Take 25 mg by mouth at bedtime. Pt takes 3 75m tablets (75 mg total) once a day    . amLODipine (NORVASC) 5 MG tablet Take 7.5 mg by mouth daily.    . buprenorphine (BUTRANS) 20 MCG/HR PTWK patch Place 20 mcg onto the skin once a week.    . Cyanocobalamin (VITAMIN B 12 PO) Take 1 tablet by mouth daily.    .Marland KitchenFLUoxetine (PROZAC) 40 MG capsule Take 40 mg by mouth daily.     . hydrOXYzine (VISTARIL) 50 MG capsule Take 50-100 mg by mouth every 6 (six) hours as needed for anxiety.    .Marland Kitchenibuprofen (ADVIL,MOTRIN) 200 MG tablet Take 800 mg by mouth 2 (two) times daily as needed for moderate pain.    .Marland Kitchenlevothyroxine (SYNTHROID, LEVOTHROID) 137 MCG tablet Take 137 mcg by mouth daily before breakfast.    . busPIRone (BUSPAR) 10 MG tablet Take 1 tablet by mouth 3 (three) times daily.    . cholecalciferol (VITAMIN D) 1000 units tablet Take 2,000 Units by mouth daily.     No current facility-administered medications for this visit.    REVIEW OF SYSTEMS:   Constitutional: Denies fevers, chills or abnormal night sweats Eyes: Denies blurriness of vision, double vision or watery eyes Ears, nose, mouth, throat, and face: Denies mucositis or sore throat Respiratory: Denies cough, dyspnea or wheezes Cardiovascular: Denies palpitation, chest discomfort or lower extremity swelling Gastrointestinal:  Denies nausea, heartburn or change in bowel habits Skin: Denies abnormal skin  rashes Lymphatics: Denies new lymphadenopathy or easy bruising Neurological:Denies numbness, tingling or new weaknesses Behavioral/Psych: Mood is stable, no new changes  Breast: Palpable right breast mass All other systems were reviewed with the patient and are negative.  PHYSICAL EXAMINATION: ECOG PERFORMANCE STATUS: 1 - Symptomatic but completely ambulatory  Vitals:   01/02/20 1248  BP: 133/66  Pulse: 94  Resp: 18  Temp: 98.9 F (37.2 C)  SpO2: 100%   Filed Weights   01/02/20 1248  Weight: 236 lb 9.6 oz (107.3 kg)    GENERAL:alert, no distress and comfortable SKIN: skin color, texture, turgor are normal, no rashes or significant lesions EYES: normal, conjunctiva are pink and non-injected, sclera clear OROPHARYNX:no exudate, no erythema and lips, buccal mucosa,  and tongue normal  NECK: supple, thyroid normal size, non-tender, without nodularity LYMPH:  no palpable lymphadenopathy in the cervical, axillary or inguinal LUNGS: clear to auscultation and percussion with normal breathing effort HEART: regular rate & rhythm and no murmurs and no lower extremity edema ABDOMEN:abdomen soft, non-tender and normal bowel sounds Musculoskeletal:no cyanosis of digits and no clubbing  PSYCH: alert & oriented x 3 with fluent speech NEURO: no focal motor/sensory deficits BREAST: Palpable right breast mass. No palpable axillary or supraclavicular lymphadenopathy (exam performed in the presence of a chaperone)   LABORATORY DATA:  I have reviewed the data as listed Lab Results  Component Value Date   WBC 8.3 01/02/2020   HGB 12.8 01/02/2020   HCT 39.7 01/02/2020   MCV 99.5 01/02/2020   PLT 280 01/02/2020   Lab Results  Component Value Date   NA 143 01/02/2020   K 4.1 01/02/2020   CL 106 01/02/2020   CO2 26 01/02/2020    RADIOGRAPHIC STUDIES: I have personally reviewed the radiological reports and agreed with the findings in the report.  ASSESSMENT AND PLAN:  Malignant  neoplasm of upper-inner quadrant of right breast in female, estrogen receptor positive ([place]) Patient palpated a right breast lump x1wk. Mammogram and US showed two adjacent masses at the 2 o'clock position measuring 1.4cm and 0.6cm, calcifications in the outer right breast at the 9 o'clock position, no right axillary adenopathy. Biopsy showed IDC at the 2 o'clock position, grade 3, HER-2 + (3+), ER+ 90%, PR+ 30%, Ki67 30%, and DCIS in the upper outer right breast, high grade, ER+ 95%, PR 90%.  Pathology and radiology counseling: Discussed with the patient, the details of pathology including the type of breast cancer,the clinical staging, the significance of ER, PR and HER-2/neu receptors and the implications for treatment. After reviewing the pathology in detail, we proceeded to discuss the different treatment options between surgery, radiation, chemotherapy, antiestrogen therapies.  Treatment plan: 1.  Double lumpectomies with sentinel lymph node biopsy 2.  Adjuvant chemotherapy depending on the final tumor characteristics including this tumor size and lymph nodes.  The size of the tumor is less than 2 cm and negative lymph nodes she will get Taxol Herceptin weekly x12 followed by Herceptin maintenance for 1 year 3.   adjuvant radiation 4.  Followed by antiestrogen therapy  Return to clinic after surgery to discuss the pathology report and finalize a treatment plan   All questions were answered. The patient knows to call the clinic with any problems, questions or concerns.   Rulon Eisenmenger, MD, MPH 01/02/2020    I, Molly Dorshimer, am acting as scribe for Nicholas Lose, MD.  I have reviewed the above documentation for accuracy and completeness, and I agree with the above.

## 2020-01-01 NOTE — Progress Notes (Signed)
Radiation Oncology         ([phone]) [phone] ________________________________  Multidisciplinary Breast Oncology Clinic [facility]) Initial Outpatient Consultation  Name: SHAINNA [name] MRN: [id number]  Date: 01/02/2020  DOB: 1951-08-26  VC:BSWH, Arbie Cookey, MD  Erroll Luna, MD   REFERRING PHYSICIAN: Erroll Luna, MD  DIAGNOSIS: The encounter diagnosis was Malignant neoplasm of upper-inner quadrant of right breast in female, estrogen receptor positive ([place]).   Multifocal breast cancer  Stage IA Right Breast UIQ, Invasive Ductal Carcinoma, ER+ / PR+ / Her2+, Grade 3  Stage 0 Right Breast UOQ, Ductal Carcinoma with DCIS, ER+ / PR+, Grade 3    ICD-10-CM   1. Malignant neoplasm of upper-inner quadrant of right breast in female, estrogen receptor positive ([place])  C50.211    Z17.0     HISTORY OF PRESENT ILLNESS::[name] is a 69 y.o. female who is presenting to the office today for evaluation of her newly diagnosed breast cancer. She is accompanied by no-one. She is doing well overall.   She noticed a palpable lump involving the inner periareolar right breast in mid December of 2020. She underwent bilateral diagnostic mammography with tomography and right breast ultrasonography at [facility] on 12/12/2019 showing highly suspicious adjacent masses involving the upper inner quadrant of the right breast at the 2 o'clock position approximately 3 cm from the nipple, associated with suspicious calcifications that span approximately 3.5 cm. Suspicious calcifications involving the upper outer quadrant of the right breast span approximately 1.4 cm and are approximately 8 cm away from the calcifications associated with the mass. No pathological right axillary lymphadenopathy. No mammographic evidence of malignancy involving the left breast.  Biopsy on 12/20/2019 showed invasive ductal carcinoma of the mass at 2 o'clock. Prognostic indicators significant for estrogen receptor, 90% positive with  strong staining intensity and progesterone receptor, 30% positive with moderate staining intensity. Proliferation marker Ki67 at 30%. HER2 positive.  Biopsy on 12/20/2019 showed ductal carcinoma in situ, high-grade, with necrosis and calcifications of the mass in the upper outer quadrant. Prognostic indicators significant for estrogen receptor, 95% positive and progesterone receptor, 90% positive, both with strong staining intensities.  Menarche: 69 years old Age at first live birth: 69 years old GP: 1 LMP: 2000 Contraceptive: Birth control pills from 1975-1976 HRT: No   The patient was referred today for presentation in the multidisciplinary conference.  Radiology studies and pathology slides were presented there for review and discussion of treatment options.  A consensus was discussed regarding potential next steps.  PREVIOUS RADIATION THERAPY: No  PAST MEDICAL HISTORY:  Past Medical History:  Diagnosis Date   Anxiety    Concussion 3/08   ICU x 3 days   Depression    Hypertension    Spinal stenosis    Thyroid disease ?1994    PAST SURGICAL HISTORY: Past Surgical History:  Procedure Laterality Date   BREAST SURGERY Right 4/99   breast biopsy, benign   SPINAL FUSION  03/04/11   with ORIF    FAMILY HISTORY:  Family History  Problem Relation Age of Onset   Thyroid disease Mother    Dementia Mother    Diabetes Father    Stroke Father    Hypertension Sister    Diabetes Sister     SOCIAL HISTORY:  Social History   Socioeconomic History   Marital status: Married    Spouse name: Not on file   Number of children: Not on file   Years of education: Not on file  Highest education level: Not on file  Occupational History   Not on file  Tobacco Use   Smoking status: Former Smoker   Smokeless tobacco: Never Used  Substance and Sexual Activity   Alcohol use: Yes    Alcohol/week: 5.0 standard drinks    Types: 5 Standard drinks or equivalent per  week   Drug use: No   Sexual activity: Never    Partners: Male  Other Topics Concern   Not on file  Social History Narrative   Not on file   Social Determinants of Health   Financial Resource Strain:    Difficulty of Paying Living Expenses: Not on file  Food Insecurity:    Worried About [place] in the Last Year: Not on file   [employer] of Food in the Last Year: Not on file  Transportation Needs:    Lack of Transportation (Medical): Not on file   Lack of Transportation (Non-Medical): Not on file  Physical Activity:    Days of Exercise per Week: Not on file   Minutes of Exercise per Session: Not on file  Stress:    Feeling of Stress : Not on file  Social Connections:    Frequency of Communication with Friends and Family: Not on file   Frequency of Social Gatherings with Friends and Family: Not on file   Attends Religious Services: Not on file   Active Member of Clubs or Organizations: Not on file   Attends Archivist Meetings: Not on file   Marital Status: Not on file    ALLERGIES:  Allergies  Allergen Reactions   Ambien [Zolpidem Tartrate] Other (See Comments)    Hallucinations and Confusion    MEDICATIONS:  Current Outpatient Medications  Medication Sig Dispense Refill   amitriptyline (ELAVIL) 25 MG tablet Take 25 mg by mouth at bedtime. Pt takes 3 35m tablets (75 mg total) once a day     amLODipine (NORVASC) 5 MG tablet Take 7.5 mg by mouth daily.     buprenorphine (BUTRANS) 20 MCG/HR PTWK patch Place 20 mcg onto the skin once a week.     busPIRone (BUSPAR) 10 MG tablet Take 1 tablet by mouth 3 (three) times daily.     cholecalciferol (VITAMIN D) 1000 units tablet Take 2,000 Units by mouth daily.     Cyanocobalamin (VITAMIN B 12 PO) Take 1 tablet by mouth daily.     FLUoxetine (PROZAC) 40 MG capsule Take 40 mg by mouth daily.      hydrOXYzine (VISTARIL) 50 MG capsule Take 50-100 mg by mouth every 6 (six) hours as  needed for anxiety.     ibuprofen (ADVIL,MOTRIN) 200 MG tablet Take 800 mg by mouth 2 (two) times daily as needed for moderate pain.     levothyroxine (SYNTHROID, LEVOTHROID) 137 MCG tablet Take 137 mcg by mouth daily before breakfast.     No current facility-administered medications for this encounter.    REVIEW OF SYSTEMS: A 10+ POINT REVIEW OF SYSTEMS WAS OBTAINED including neurology, dermatology, psychiatry, cardiac, respiratory, lymph, extremities, GI, GU, musculoskeletal, constitutional, reproductive, HEENT. On the provided form, she reports back pain, muscle aches, fever, loss of sleep, irregular heartbeat, pedal edema, difficulty walking, lump in right breast, bruising easily, anxiety, and depression. Patient also reports history of thyroid problems and wearing glasses. Denies cough, shortness of breath, abdominal pain, dysuria, any other symptoms.    PHYSICAL EXAM:   Vitals with BMI 01/02/2020  Height '5\' 7"'   Weight 236 lbs  10 oz  BMI 12.75  Systolic 170  Diastolic 66  Pulse 94    Lungs are clear to auscultation bilaterally. Heart has regular rate and rhythm. No palpable cervical, supraclavicular, or axillary adenopathy. Abdomen soft, non-tender, normal bowel sounds. Left breast: No palpable mass, nipple discharge, or bleeding.  Right breast with bruising in the upper aspect of the breast. There appears to be a palpable mass in the upper quadrant that measures approximately 2 cm.   KPS = 90  100 - Normal; no complaints; no evidence of disease. 90   - Able to carry on normal activity; minor signs or symptoms of disease. 80   - Normal activity with effort; some signs or symptoms of disease. 64   - Cares for self; unable to carry on normal activity or to do active work. 60   - Requires occasional assistance, but is able to care for most of his personal needs. 50   - Requires considerable assistance and frequent medical care. 58   - Disabled; requires special care and  assistance. 26   - Severely disabled; hospital admission is indicated although death not imminent. 47   - Very sick; hospital admission necessary; active supportive treatment necessary. 10   - Moribund; fatal processes progressing rapidly. 0     - Dead  Karnofsky DA, Abelmann [place], Craver LS and Burchenal JH [phone]) The use of the nitrogen mustards in the palliative treatment of carcinoma: with particular reference to bronchogenic carcinoma Cancer 1 634-56  LABORATORY DATA:  Lab Results  Component Value Date   WBC 8.3 01/02/2020   HGB 12.8 01/02/2020   HCT 39.7 01/02/2020   MCV 99.5 01/02/2020   PLT 280 01/02/2020   Lab Results  Component Value Date   NA 143 01/02/2020   K 4.1 01/02/2020   CL 106 01/02/2020   CO2 26 01/02/2020   Lab Results  Component Value Date   ALT 16 01/02/2020   AST 14 (L) 01/02/2020   ALKPHOS 94 01/02/2020   BILITOT 0.3 01/02/2020    PULMONARY FUNCTION TEST:   Recent Review Flowsheet Data    There is no flowsheet data to display.      RADIOGRAPHY: US BREAST LTD UNI RIGHT INC AXILLA  Result Date: 12/12/2019 CLINICAL DATA:  69 year old presenting with a palpable lump involving the INNER periareolar RIGHT breast which she initially noted approximately 1 week ago. Annual evaluation, LEFT breast. EXAM: DIGITAL DIAGNOSTIC BILATERAL MAMMOGRAM WITH CAD AND TOMO ULTRASOUND RIGHT BREAST COMPARISON:  Previous exam(s). ACR Breast Density Category b: There are scattered areas of fibroglandular density. FINDINGS: Tomosynthesis and synthesized full field CC and MLO views of both breasts were obtained. Tomosynthesis and synthesized spot compression tangential view of the area of concern in the RIGHT breast and spot magnification CC and MLO views of 2 sites of calcifications in the RIGHT breast were also obtained. Corresponding to the palpable concern in the slight UPPER INNER RIGHT breast at ANTERIOR depth is an approximate 1.5 cm mass with irregular margins associated  with possible mild skin retraction. There are suspicious pleomorphic and linear calcifications in the UPPER INNER breast associated with the mass, the calcifications spanning approximately 3.4 x 3.3 x 3.5 cm. There is a second group of pleomorphic and linear calcifications associated with a focal asymmetry in the UPPER OUTER QUADRANT of the RIGHT breast at MIDDLE depth, spanning approximately 1.4 x 1.0 x 0.6 cm. This group of calcifications is approximately 8 cm away from the calcifications associated with the  mass. A low-density mass is identified in the [place] of the RIGHT breast measuring approximately 6 mm without associated architectural distortion or suspicious calcifications. No findings suspicious for malignancy in the LEFT breast. Mammographic images were processed with CAD. On correlative physical exam, there is a firm palpable approximate 2 cm lump in the UPPER INNER periareolar RIGHT breast. Targeted RIGHT breast ultrasound is performed, showing adjacent hypoechoic masses with irregular margins associated with calcifications at the 2 o'clock position approximately 3 cm from the nipple, measuring in total approximately 1.8 cm. The larger of the 2 masses measures approximately 1.2 x 1.4 x 1.2 cm and the smaller mass measures approximately 0.6 x 0.5 x 0.4 cm. The masses are only separated by 2 mm. Both masses demonstrate posterior acoustic shadowing and internal power Doppler flow. A shadowing focus in the OUTER RIGHT breast at 9 o'clock position approximately 6 cm from the nipple which measures approximately 2 x 3 x 5 mm may correspond to the calcifications in the OUTER RIGHT breast, though these are much more conspicuous on mammography. At the 10 o'clock position approximately 7 cm from the nipple at MIDDLE depth are adjacent small intramammary lymph nodes each measuring approximately 3 mm, accounting for the mammographic mass. Sonographic evaluation of the RIGHT axilla demonstrates no  pathologic lymphadenopathy. IMPRESSION: 1. Highly suspicious adjacent masses involving the UPPER INNER QUADRANT of the RIGHT breast at the 2 o'clock position approximately 3 cm from the nipple, associated with suspicious calcifications that span approximately 3.5 cm. 2. Suspicious calcifications involving the UPPER OUTER QUADRANT of the RIGHT breast which span approximately 1.4 cm. These calcifications are approximately 8 cm away from the calcifications associated with the mass. 3. No pathologic RIGHT axillary lymphadenopathy. 4. No mammographic evidence of malignancy involving the LEFT breast. RECOMMENDATION: 1. Ultrasound-guided core needle biopsy of the adjacent masses in the UPPER INNER RIGHT breast. As these masses are separated by only approximately 2 mm, a single biopsy will be sufficient. 2. Stereotactic tomosynthesis core needle biopsy of the suspicious calcifications in the UPPER OUTER QUADRANT of the RIGHT breast. The ultrasound core needle biopsy procedure and the stereotactic core needle biopsy procedure were discussed with the patient and her questions were answered. She wishes to proceed and the biopsies have been scheduled at her convenience. I have discussed the findings and recommendations with the patient. BI-RADS CATEGORY  5: Highly suggestive of malignancy. Electronically Signed   By: Evangeline Dakin M.D.   On: 12/12/2019 14:48   MM DIAG BREAST TOMO BILATERAL  Result Date: 12/12/2019 CLINICAL DATA:  69 year old presenting with a palpable lump involving the INNER periareolar RIGHT breast which she initially noted approximately 1 week ago. Annual evaluation, LEFT breast. EXAM: DIGITAL DIAGNOSTIC BILATERAL MAMMOGRAM WITH CAD AND TOMO ULTRASOUND RIGHT BREAST COMPARISON:  Previous exam(s). ACR Breast Density Category b: There are scattered areas of fibroglandular density. FINDINGS: Tomosynthesis and synthesized full field CC and MLO views of both breasts were obtained. Tomosynthesis and  synthesized spot compression tangential view of the area of concern in the RIGHT breast and spot magnification CC and MLO views of 2 sites of calcifications in the RIGHT breast were also obtained. Corresponding to the palpable concern in the slight UPPER INNER RIGHT breast at ANTERIOR depth is an approximate 1.5 cm mass with irregular margins associated with possible mild skin retraction. There are suspicious pleomorphic and linear calcifications in the UPPER INNER breast associated with the mass, the calcifications spanning approximately 3.4 x 3.3 x 3.5 cm. There  is a second group of pleomorphic and linear calcifications associated with a focal asymmetry in the UPPER OUTER QUADRANT of the RIGHT breast at MIDDLE depth, spanning approximately 1.4 x 1.0 x 0.6 cm. This group of calcifications is approximately 8 cm away from the calcifications associated with the mass. A low-density mass is identified in the [place] of the RIGHT breast measuring approximately 6 mm without associated architectural distortion or suspicious calcifications. No findings suspicious for malignancy in the LEFT breast. Mammographic images were processed with CAD. On correlative physical exam, there is a firm palpable approximate 2 cm lump in the UPPER INNER periareolar RIGHT breast. Targeted RIGHT breast ultrasound is performed, showing adjacent hypoechoic masses with irregular margins associated with calcifications at the 2 o'clock position approximately 3 cm from the nipple, measuring in total approximately 1.8 cm. The larger of the 2 masses measures approximately 1.2 x 1.4 x 1.2 cm and the smaller mass measures approximately 0.6 x 0.5 x 0.4 cm. The masses are only separated by 2 mm. Both masses demonstrate posterior acoustic shadowing and internal power Doppler flow. A shadowing focus in the OUTER RIGHT breast at 9 o'clock position approximately 6 cm from the nipple which measures approximately 2 x 3 x 5 mm may correspond to the  calcifications in the OUTER RIGHT breast, though these are much more conspicuous on mammography. At the 10 o'clock position approximately 7 cm from the nipple at MIDDLE depth are adjacent small intramammary lymph nodes each measuring approximately 3 mm, accounting for the mammographic mass. Sonographic evaluation of the RIGHT axilla demonstrates no pathologic lymphadenopathy. IMPRESSION: 1. Highly suspicious adjacent masses involving the UPPER INNER QUADRANT of the RIGHT breast at the 2 o'clock position approximately 3 cm from the nipple, associated with suspicious calcifications that span approximately 3.5 cm. 2. Suspicious calcifications involving the UPPER OUTER QUADRANT of the RIGHT breast which span approximately 1.4 cm. These calcifications are approximately 8 cm away from the calcifications associated with the mass. 3. No pathologic RIGHT axillary lymphadenopathy. 4. No mammographic evidence of malignancy involving the LEFT breast. RECOMMENDATION: 1. Ultrasound-guided core needle biopsy of the adjacent masses in the UPPER INNER RIGHT breast. As these masses are separated by only approximately 2 mm, a single biopsy will be sufficient. 2. Stereotactic tomosynthesis core needle biopsy of the suspicious calcifications in the UPPER OUTER QUADRANT of the RIGHT breast. The ultrasound core needle biopsy procedure and the stereotactic core needle biopsy procedure were discussed with the patient and her questions were answered. She wishes to proceed and the biopsies have been scheduled at her convenience. I have discussed the findings and recommendations with the patient. BI-RADS CATEGORY  5: Highly suggestive of malignancy. Electronically Signed   By: Evangeline Dakin M.D.   On: 12/12/2019 14:48   MM CLIP PLACEMENT RIGHT  Result Date: 12/20/2019 CLINICAL DATA:  Post biopsy mammogram following ultrasound and stereotactic biopsies of the right breast. EXAM: DIAGNOSTIC RIGHT MAMMOGRAM POST STEREOTACTIC AND  ULTRASOUND BIOPSY COMPARISON:  Previous exam(s). FINDINGS: Mammographic images were obtained following ultrasound guided biopsy of a right breast mass at 2 o'clock and stereotactic biopsy of calcifications in the upper-outer right breast. The biopsy marking clip is in expected position at the site of biopsy. IMPRESSION: 1. Appropriate positioning of the ribbon shaped biopsy marking clip at the site of biopsy in the mass in the right breast at 2 o'clock. 2. Appropriate positioning of the coil shaped biopsy marking clip at the site of the calcifications in the upper-outer quadrant  of the right breast. Final Assessment: Post Procedure Mammograms for Marker Placement Electronically Signed   By: Ammie Ferrier M.D.   On: 12/20/2019 12:50   MM RT BREAST BX W LOC DEV 1ST LESION IMAGE BX SPEC STEREO GUIDE  Addendum Date: 12/24/2019   ADDENDUM REPORT: 12/24/2019 13:53 ADDENDUM: Pathology revealed GRADE III INVASIVE DUCTAL CARCINOMA of the Right breast, 2 o'clock. This was found to be concordant by Dr. Ammie Ferrier. Pathology revealed HIGH-GRADE DUCTAL CARCINOMA IN SITU WITH NECROSIS AND CALCIFICATIONS of the Right breast, upper outer. This was found to be concordant by Dr. Ammie Ferrier. Pathology results were discussed with the patient by telephone. The patient reported doing well after the biopsies with tenderness at the sites. Post biopsy instructions and care were reviewed and questions were answered. The patient was encouraged to call [facility] for any additional concerns. The patient was referred to [facility] at [facility] on January 02, 2020. Pathology results reported by Terie Purser, RN on 12/24/2019. Electronically Signed   By: Ammie Ferrier M.D.   On: 12/24/2019 13:53   Result Date: 12/24/2019 CLINICAL DATA:  69 year old female presenting for stereotactic biopsy of right breast calcifications. EXAM:  ULTRASOUND GUIDED RIGHT BREAST CORE NEEDLE BIOPSY COMPARISON:  Previous exam(s). FINDINGS: I met with the patient and we discussed the procedure of ultrasound-guided biopsy, including benefits and alternatives. We discussed the high likelihood of a successful procedure. We discussed the risks of the procedure, including infection, bleeding, tissue injury, clip migration, and inadequate sampling. Informed written consent was given. The usual time-out protocol was performed immediately prior to the procedure. Lesion quadrant: Upper outer quadrant Using sterile technique and 1% Lidocaine as local anesthetic, under direct ultrasound visualization, a 14 gauge spring-loaded device was used to perform biopsy of calcifications in the upper-outer quadrant of the right breast using a lateral approach. At the conclusion of the procedure coil shaped tissue marker clip was deployed into the biopsy cavity. Follow up 2 view mammogram was performed and dictated separately. IMPRESSION: Ultrasound guided biopsy of calcifications in the upper-outer right breast. No apparent complications. Electronically Signed: By: Ammie Ferrier M.D. On: 12/20/2019 11:39   [place] RT BREAST BX W LOC DEV 1ST LESION IMG BX SPEC US GUIDE  Addendum Date: 12/24/2019   ADDENDUM REPORT: 12/24/2019 13:52 ADDENDUM: Pathology revealed GRADE III INVASIVE DUCTAL CARCINOMA of the Right breast, 2 o'clock. This was found to be concordant by Dr. Ammie Ferrier. Pathology revealed HIGH-GRADE DUCTAL CARCINOMA IN SITU WITH NECROSIS AND CALCIFICATIONS of the Right breast, upper outer. This was found to be concordant by Dr. Ammie Ferrier. Pathology results were discussed with the patient by telephone. The patient reported doing well after the biopsies with tenderness at the sites. Post biopsy instructions and care were reviewed and questions were answered. The patient was encouraged to call [facility] for any additional concerns. The  patient was referred to [facility] at [facility] on January 02, 2020. Pathology results reported by Terie Purser, RN on 12/24/2019. Electronically Signed   By: Ammie Ferrier M.D.   On: 12/24/2019 13:52   Result Date: 12/24/2019 CLINICAL DATA:  69 year old female presenting for ultrasound-guided biopsy of a right breast mass. EXAM: ULTRASOUND GUIDED RIGHT BREAST CORE NEEDLE BIOPSY COMPARISON:  Previous exam(s). FINDINGS: I met with the patient and we discussed the procedure of ultrasound-guided biopsy, including benefits and alternatives.  We discussed the high likelihood of a successful procedure. We discussed the risks of the procedure, including infection, bleeding, tissue injury, clip migration, and inadequate sampling. Informed written consent was given. The usual time-out protocol was performed immediately prior to the procedure. Lesion quadrant: Upper inner quadrant Using sterile technique and 1% Lidocaine as local anesthetic, under direct ultrasound visualization, a 14 gauge spring-loaded device was used to perform biopsy of a mass in the right breast at 2 o'clock using an inferior approach. At the conclusion of the procedure ribbon shaped tissue marker clip was deployed into the biopsy cavity. Follow up 2 view mammogram was performed and dictated separately. IMPRESSION: Ultrasound guided biopsy of a mass in the right breast at 2 o'clock. No apparent complications. Electronically Signed: By: Ammie Ferrier M.D. On: 12/20/2019 11:12      IMPRESSION:   Stage IA Right Breast UIQ, Invasive Ductal Carcinoma, ER+ / PR+ / Her2+, Grade 3  Stage 0 Right Breast UOQ, Ductal Carcinoma with DCIS, ER+ / PR+, Grade 3  Patient will be a good candidate for breast conservation surgery with two separate lumpectomies. She would like to keep her breast if at all possible. She is also a good candidate for radiotherapy to the right breast. We discussed the  general course of radiation, potential side effects, and toxicities with radiation and the patient is interested in this approach.    PLAN:  1. Right lumpectomies with sentinel lymph node biopsy and port 2. Adjuvant chemotherapy 3. Adjuvant radiation therapy 4. Aromatase inhibitor   ------------------------------------------------  Blair Promise, PhD, MD  This document serves as a record of services personally performed by Gery Pray, MD. It was created on his behalf by Clerance Lav, a trained medical scribe. The creation of this record is based on the scribe's personal observations and the provider's statements to them. This document has been checked and approved by the attending provider.

## 2020-01-02 ENCOUNTER — Ambulatory Visit
Admission: RE | Admit: 2020-01-02 | Discharge: 2020-01-02 | Disposition: A | Discharge status: Home / Self Care | Payer: Medicare HMO | Source: Ambulatory Visit | Attending: Radiation Oncology | Admitting: Radiation Oncology

## 2020-01-02 ENCOUNTER — Encounter: Payer: Self-pay | Admitting: Physical Therapy

## 2020-01-02 ENCOUNTER — Inpatient Hospital Stay (HOSPITAL_BASED_OUTPATIENT_CLINIC_OR_DEPARTMENT_OTHER): Payer: Medicare PPO | Admitting: Hematology and Oncology

## 2020-01-02 ENCOUNTER — Other Ambulatory Visit: Payer: Self-pay

## 2020-01-02 ENCOUNTER — Encounter: Payer: Self-pay | Admitting: Hematology and Oncology

## 2020-01-02 ENCOUNTER — Ambulatory Visit: Payer: Medicare PPO | Attending: Surgery | Admitting: Physical Therapy

## 2020-01-02 ENCOUNTER — Ambulatory Visit: Payer: Self-pay | Admitting: Surgery

## 2020-01-02 ENCOUNTER — Inpatient Hospital Stay: Payer: Medicare PPO | Attending: Hematology and Oncology

## 2020-01-02 DIAGNOSIS — C50211 Malignant neoplasm of upper-inner quadrant of right female breast: Secondary | ICD-10-CM | POA: Diagnosis present

## 2020-01-02 DIAGNOSIS — R296 Repeated falls: Secondary | ICD-10-CM | POA: Diagnosis present

## 2020-01-02 DIAGNOSIS — R293 Abnormal posture: Secondary | ICD-10-CM | POA: Diagnosis present

## 2020-01-02 DIAGNOSIS — R262 Difficulty in walking, not elsewhere classified: Secondary | ICD-10-CM | POA: Diagnosis present

## 2020-01-02 DIAGNOSIS — Z170 Estrogen receptor positive status [ER+]: Secondary | ICD-10-CM | POA: Insufficient documentation

## 2020-01-02 DIAGNOSIS — Z87891 Personal history of nicotine dependence: Secondary | ICD-10-CM

## 2020-01-02 DIAGNOSIS — C50311 Malignant neoplasm of lower-inner quadrant of right female breast: Secondary | ICD-10-CM

## 2020-01-02 LAB — CMP (CANCER CENTER ONLY)
ALT: 16 U/L (ref 0–44)
AST: 14 U/L — ABNORMAL LOW (ref 15–41)
Albumin: 3.5 g/dL (ref 3.5–5.0)
Alkaline Phosphatase: 94 U/L (ref 38–126)
Anion gap: 11 (ref 5–15)
BUN: 22 mg/dL (ref 8–23)
CO2: 26 mmol/L (ref 22–32)
Calcium: 8.6 mg/dL — ABNORMAL LOW (ref 8.9–10.3)
Chloride: 106 mmol/L (ref 98–111)
Creatinine: 0.83 mg/dL (ref 0.44–1.00)
GFR, Est AFR Am: >60 mL/min (ref >60)
GFR, Estimated: >60 mL/min (ref >60)
Glucose, Bld: 91 mg/dL (ref 70–99)
Potassium: 4.1 mmol/L (ref 3.5–5.1)
Sodium: 143 mmol/L (ref 135–145)
Total Bilirubin: 0.3 mg/dL (ref 0.3–1.2)
Total Protein: 7 g/dL (ref 6.5–8.1)

## 2020-01-02 LAB — CBC WITH DIFFERENTIAL (CANCER CENTER ONLY)
Abs Immature Granulocytes: 0.05 10*3/uL (ref 0.00–0.07)
Basophils Absolute: 0 10*3/uL (ref 0.0–0.1)
Basophils Relative: 1 %
Eosinophils Absolute: 0.1 10*3/uL (ref 0.0–0.5)
Eosinophils Relative: 1 %
HCT: 39.7 % (ref 36.0–46.0)
Hemoglobin: 12.8 g/dL (ref 12.0–15.0)
Immature Granulocytes: 1 %
Lymphocytes Relative: 18 %
Lymphs Abs: 1.5 10*3/uL (ref 0.7–4.0)
MCH: 32.1 pg (ref 26.0–34.0)
MCHC: 32.2 g/dL (ref 30.0–36.0)
MCV: 99.5 fL (ref 80.0–100.0)
Monocytes Absolute: 0.7 10*3/uL (ref 0.1–1.0)
Monocytes Relative: 8 %
Neutro Abs: 6 10*3/uL (ref 1.7–7.7)
Neutrophils Relative %: 71 %
Platelet Count: 280 10*3/uL (ref 150–400)
RBC: 3.99 MIL/uL (ref 3.87–5.11)
RDW: 13 % (ref 11.5–15.5)
WBC Count: 8.3 10*3/uL (ref 4.0–10.5)
nRBC: 0 % (ref 0.0–0.2)

## 2020-01-02 LAB — GENETIC SCREENING ORDER

## 2020-01-02 NOTE — H&P (View-Only) (Signed)
[name] Documented: 01/02/2020 7:37 AM Location: [facility] Patient #: [id number] DOB: Jul 26, 1951 Undefined / Language: Cleophus Molt / Race: White Female  History of Present Illness Isabel Moores [name]. Aislyn Hayse MD; 01/02/2020 2:33 PM) Patient words: 69 year old presenting with [name] palpable lump involving the INNER periareolar RIGHT breast which she initially noted approximately 4 weeks AGO. She denies pain discharge or skin change.          CLINICAL DATA: 69 year old presenting with [name] palpable lump involving the INNER periareolar RIGHT breast which she initially noted approximately 1 week ago. Annual evaluation, LEFT breast.  EXAM: DIGITAL DIAGNOSTIC BILATERAL MAMMOGRAM WITH CAD AND TOMO  ULTRASOUND RIGHT BREAST  COMPARISON: Previous exam(s).  ACR Breast Density Category b: There are scattered areas of fibroglandular density.  FINDINGS: Tomosynthesis and synthesized full field CC and MLO views of both breasts were obtained. Tomosynthesis and synthesized spot compression tangential view of the area of concern in the RIGHT breast and spot magnification CC and MLO views of 2 sites of calcifications in the RIGHT breast were also obtained.  Corresponding to the palpable concern in the slight UPPER INNER RIGHT breast at ANTERIOR depth is an approximate 1.5 cm mass with irregular margins associated with possible mild skin retraction. There are suspicious pleomorphic and linear calcifications in the UPPER INNER breast associated with the mass, the calcifications spanning approximately 3.4 x 3.3 x 3.5 cm.  There is [name] second group of pleomorphic and linear calcifications associated with [name] focal asymmetry in the UPPER OUTER QUADRANT of the RIGHT breast at MIDDLE depth, spanning approximately 1.4 x 1.0 x 0.6 cm. This group of calcifications is approximately 8 cm away from the calcifications associated with the mass.  [name] low-density mass is identified in the Stephens of the RIGHT breast measuring approximately 6 mm without associated architectural distortion or suspicious calcifications.  No findings suspicious for malignancy in the LEFT breast.  Mammographic images were processed with CAD.  On correlative physical exam, there is [name] firm palpable approximate 2 cm lump in the UPPER INNER periareolar RIGHT breast.  Targeted RIGHT breast ultrasound is performed, showing adjacent hypoechoic masses with irregular margins associated with calcifications at the 2 o'clock position approximately 3 cm from the nipple, measuring in total approximately 1.8 cm. The larger of the 2 masses measures approximately 1.2 x 1.4 x 1.2 cm and the smaller mass measures approximately 0.6 x 0.5 x 0.4 cm. The masses are only separated by 2 mm. Both masses demonstrate posterior acoustic shadowing and internal power Doppler flow.  [name] shadowing focus in the OUTER RIGHT breast at 9 o'clock position approximately 6 cm from the nipple which measures approximately 2 x 3 x 5 mm may correspond to the calcifications in the OUTER RIGHT breast, though these are much more conspicuous on mammography.  At the 10 o'clock position approximately 7 cm from the nipple at MIDDLE depth are adjacent small intramammary lymph nodes each measuring approximately 3 mm, accounting for the mammographic mass.  Sonographic evaluation of the RIGHT axilla demonstrates no pathologic lymphadenopathy.  IMPRESSION: 1. Highly suspicious adjacent masses involving the UPPER INNER QUADRANT of the RIGHT breast at the 2 o'clock position approximately 3 cm from the nipple, associated with suspicious calcifications that span approximately 3.5 cm. 2. Suspicious calcifications involving the UPPER OUTER QUADRANT of the RIGHT breast which span approximately 1.4 cm. These calcifications are approximately 8 cm away from the calcifications associated with the mass. 3. No pathologic RIGHT axillary  lymphadenopathy. 4. No mammographic evidence  of malignancy involving the LEFT breast.  RECOMMENDATION: 1. Ultrasound-guided core needle biopsy of the adjacent masses in the UPPER INNER RIGHT breast. As these masses are separated by only approximately 2 mm, [name] single biopsy will be sufficient. 2. Stereotactic tomosynthesis core needle biopsy of the suspicious calcifications in the UPPER OUTER QUADRANT of the RIGHT breast.  The ultrasound core needle biopsy procedure and the stereotactic core needle biopsy procedure were discussed with the patient and her questions were answered. She wishes to proceed and the biopsies have been scheduled at her convenience.  I have discussed the findings and recommendations with the patient.  BI-RADS CATEGORY 5: Highly suggestive of malignancy.   Electronically Signed By: Evangeline Dakin M.D. On: 12/12/2019 14:48              ADDITIONAL INFORMATION: 1. PROGNOSTIC INDICATORS Results: IMMUNOHISTOCHEMICAL AND MORPHOMETRIC ANALYSIS PERFORMED MANUALLY The tumor cells are POSITIVE for Her2 (3+). Estrogen Receptor: 90%, POSITIVE, STRONG STAINING INTENSITY Progesterone Receptor: 30%, POSITIVE, MODERATE STAINING INTENSITY Proliferation Marker Ki67: 30% REFERENCE RANGE ESTROGEN RECEPTOR NEGATIVE 0% POSITIVE =>1% REFERENCE RANGE PROGESTERONE RECEPTOR NEGATIVE 0% POSITIVE =>1% All controls stained appropriately Thressa Sheller MD Pathologist, Electronic Signature ( Signed 12/25/2019) 2. Immunohistochemical stains for calponin, SMM1, p63 and E-cadherin do not show evidence of invasive ductal or lobular carcinoma. Jaquita Folds MD Pathologist, Electronic Signature ( Signed 12/27/2019) 2. PROGNOSTIC INDICATORS 1 of 3 FINAL for [name], [name] [phone]) ADDITIONAL INFORMATION:(continued) Results: IMMUNOHISTOCHEMICAL AND MORPHOMETRIC ANALYSIS PERFORMED MANUALLY Estrogen Receptor: 95%, POSITIVE, STRONG STAINING  INTENSITY Progesterone Receptor: 90%, POSITIVE, STRONG STAINING INTENSITY REFERENCE RANGE ESTROGEN RECEPTOR NEGATIVE 0% POSITIVE =>1% REFERENCE RANGE PROGESTERONE RECEPTOR NEGATIVE 0% POSITIVE =>1% All controls stained appropriately Thressa Sheller MD Pathologist, Electronic Signature ( Signed 12/25/2019) FINAL DIAGNOSIS Diagnosis 1. Breast, right, needle core biopsy, mass, 2 o'clock - INVASIVE DUCTAL CARCINOMA. SEE NOTE 2. Breast, right, needle core biopsy, upper outer - DUCTAL CARCINOMA IN SITU, HIGH-GRADE, WITH NECROSIS AND CALCIFICATIONS. SEE NOTE Diagnosis Note 1. Invasive carcinoma measures 1.1 cm in greatest linear dimension and appears grade 3. [name] breast prognostic profile is pending and will be reported in an addendum. 2. DCIS measures 1.2 cm in greatest linear dimension. [name] breast prognostic profile (ER and PR) is pending and will be reported in an addendum. Dr. Melina Copa has reviewed the case and concurs with the diagnosis. The [facility] was notified on 12/24/2019. Jaquita Folds MD Pathologist, Electronic Signature.  The patient is [name] 69 year old female.   Past Surgical History Tawni Pummel, RN; 01/02/2020 7:38 AM) Breast Biopsy Right. Shoulder Surgery Right. Spinal Surgery - Lower Back  Diagnostic Studies History Tawni Pummel, RN; 01/02/2020 7:38 AM) Colonoscopy >10 years ago Mammogram within last year Pap Smear 1-5 years ago  Medication History Tawni Pummel, RN; 01/02/2020 7:38 AM) Medications Reconciled  Social History Tawni Pummel, RN; 01/02/2020 7:38 AM) Alcohol use Moderate alcohol use. Caffeine use Coffee. Tobacco use Former smoker.  Family History Tawni Pummel, RN; 01/02/2020 7:38 AM) Alcohol Abuse Father. Diabetes Mellitus Father.  Pregnancy / Birth History Tawni Pummel, RN; 01/02/2020 7:38 AM) Age at menarche 45 years. Age of menopause 51-55 Contraceptive History Oral contraceptives. Gravida  1 Maternal age 12-30 Para 1 Regular periods  Other Problems Tawni Pummel, RN; 01/02/2020 7:38 AM) Anxiety Disorder Arthritis Breast Cancer Thyroid Disease     Review of Systems Sunday Spillers Ledford RN; 01/02/2020 7:38 AM) General Not Present- Appetite Loss, Chills, Fatigue, Fever, Night Sweats, Weight Gain and Weight Loss. Skin Not Present- Change  in Wart/Mole, Dryness, Hives, Jaundice, New Lesions, Non-Healing Wounds, Rash and Ulcer. HEENT Present- Wears glasses/contact lenses. Not Present- Earache, Hearing Loss, Hoarseness, Nose Bleed, Oral Ulcers, Ringing in the Ears, Seasonal Allergies, Sinus Pain, Sore Throat, Visual Disturbances and Yellow Eyes. Respiratory Not Present- Bloody sputum, Chronic Cough, Difficulty Breathing, Snoring and Wheezing. Breast Present- Breast Mass. Not Present- Breast Pain, Nipple Discharge and Skin Changes. Cardiovascular Not Present- Chest Pain, Difficulty Breathing Lying Down, Leg Cramps, Palpitations, Rapid Heart Rate, Shortness of Breath and Swelling of Extremities. Gastrointestinal Not Present- Abdominal Pain, Bloating, Bloody Stool, Change in Bowel Habits, Chronic diarrhea, Constipation, Difficulty Swallowing, Excessive gas, Gets full quickly at meals, Hemorrhoids, Indigestion, Nausea, Rectal Pain and Vomiting. Female Genitourinary Not Present- Frequency, Nocturia, Painful Urination, Pelvic Pain and Urgency. Musculoskeletal Present- Back Pain and Muscle Weakness. Not Present- Joint Pain, Joint Stiffness, Muscle Pain and Swelling of Extremities. Neurological Not Present- Decreased Memory, Fainting, Headaches, Numbness, Seizures, Tingling, Tremor, Trouble walking and Weakness. Psychiatric Present- Anxiety and Depression. Not Present- Bipolar, Change in Sleep Pattern, Fearful and Frequent crying. Endocrine Present- Heat Intolerance. Not Present- Cold Intolerance, Excessive Hunger, Hair Changes, Hot flashes and New Diabetes. Hematology Present- Easy  Bruising. Not Present- Blood Thinners, Excessive bleeding, Gland problems, HIV and Persistent Infections.   Physical Exam (Alan Riles [name]. Kadian Barcellos MD; 01/02/2020 2:35 PM)  General Mental Status-Alert. General Appearance-Consistent with stated age. Hydration-Well hydrated. Voice-Normal.  Chest and Lung Exam Note: WOB normal  Breast Note: mass right breast UOQ from bx mass below NAC 1 cm swollen with bruising left breast normal  Cardiovascular Note: nsr  Neurologic Neurologic evaluation reveals -alert and oriented x 3 with no impairment of recent or remote memory. Mental Status-Normal.  Lymphatic Head & Neck  General Head & Neck Lymphatics: Bilateral - Description - Normal. Axillary  General Axillary Region: Bilateral - Description - Normal. Tenderness - Non Tender.    Assessment & Plan (Chaylee Ehrsam [name]. Maryela Tapper MD; 01/02/2020 2:39 PM)  BREAST CANCER, RIGHT (C50.911) Impression: Pt has multifocal disease but is [name] lumpectomy candidate Discussed mastectomy with reconstruction  Pt has chosen lumpectomy x 2 and SLN mapping  Risk of lumpectomy include bleeding, infection, seroma, more surgery, use of seed/wire, wound care, cosmetic deformity and the need for other treatments, death , blood clots, death. Pt agrees to proceed. Risk of sentinel lymph node mapping include bleeding, infection, lymphedema, shoulder pain. stiffness, dye allergy. cosmetic deformity , blood clots, death, need for more surgery. Pt agrees to proceed.  total time face to face reviewing record path radiology studies and documentation 45 minutes  Current Plans You are being scheduled for surgery- Our schedulers will call you.  You should hear from our office's scheduling department within 5 working days about the location, date, and time of surgery. We try to make accommodations for patient's preferences in scheduling surgery, but sometimes the OR schedule or the surgeon's schedule prevents [place] from  making those accommodations.  If you have not heard from our office [phone]) in 5 working days, call the office and ask for your surgeon's nurse.  If you have other questions about your diagnosis, plan, or surgery, call the office and ask for your surgeon's nurse.  Pt Education - [facility] Breast Cancer Information Given - [facility] "Breast Journey" Package Pt Education - [facility] Breast Biopsy HCI: discussed with patient and provided information. We discussed the staging and pathophysiology of breast cancer. We discussed all of the different options for treatment for breast cancer including surgery, chemotherapy, radiation therapy, Herceptin, and  antiestrogen therapy. We discussed [name] sentinel lymph node biopsy as she does not appear to having lymph node involvement right now. We discussed the performance of that with injection of radioactive tracer and blue dye. We discussed that she would have an incision underneath her axillary hairline. We discussed that there is [name] bout [name] 10-20% chance of having [name] positive node with [name] sentinel lymph node biopsy and we will await the permanent pathology to make any other first further decisions in terms of her treatment. One of these options might be to return to the operating room to perform an axillary lymph node dissection. We discussed about [name] 1-2% risk lifetime of chronic shoulder pain as well as lymphedema associated with [name] sentinel lymph node biopsy. We discussed the options for treatment of the breast cancer which included lumpectomy versus [name] mastectomy. We discussed the performance of the lumpectomy with [name] wire placement. We discussed [name] 10-20% chance of [name] positive margin requiring reexcision in the operating room. We also discussed that she may need radiation therapy or antiestrogen therapy or both if she undergoes lumpectomy. We discussed the mastectomy and the postoperative care for that as well. We discussed that there is no difference in her survival whether she  undergoes lumpectomy with radiation therapy or antiestrogen therapy versus [name] mastectomy. There is [name] slight difference in the local recurrence rate being 3-5% with lumpectomy and about 1% with [name] mastectomy. We discussed the risks of operation including bleeding, infection, possible reoperation. She understands her further therapy will be based on what her stages at the time of her operation.  We discussed the staging and pathophysiology of breast cancer. We discussed all of the different options for treatment for breast cancer including surgery, chemotherapy, radiation therapy, Herceptin, and antiestrogen therapy. We discussed [name] sentinel lymph node biopsy as she does not appear to having lymph node involvement right now. We discussed the performance of that with injection of radioactive tracer and blue dye. We discussed that she would have an incision underneath her axillary hairline. We discussed that there is [name] bout [name] 10-20% chance of having [name] positive node with [name] sentinel lymph node biopsy and we will await the permanent pathology to make any other first further decisions in terms of her treatment. One of these options might be to return to the operating room to perform an axillary lymph node dissection. We discussed about [name] 1-2% risk lifetime of chronic shoulder pain as well as lymphedema associated with [name] sentinel lymph node biopsy. We discussed the options for treatment of the breast cancer which included lumpectomy versus [name] mastectomy. We discussed the performance of the lumpectomy with [name] wire placement. We discussed [name] 10-20% chance of [name] positive margin requiring reexcision in the operating room. We also discussed that she may need radiation therapy or antiestrogen therapy or both if she undergoes lumpectomy. We discussed the mastectomy and the postoperative care for that as well. We discussed that there is no difference in her survival whether she undergoes lumpectomy with radiation therapy or  antiestrogen therapy versus [name] mastectomy. There is [name] slight difference in the local recurrence rate being 3-5% with lumpectomy and about 1% with [name] mastectomy. We discussed the risks of operation including bleeding, infection, possible reoperation. She understands her further therapy will be based on what her stages at the time of her operation.       Poor venous access - port placement     The procedure has been discussed with the patient.  Alternative therapies have been discussed with the patient.  Operative risks include bleeding,  Infection, collapse lung , injury to mediastinal structures    Organ injury,  Nerve injury,  Blood vessel injury,  DVT,  Pulmonary embolism,  Death,  And possible reoperation.  Medical management risks include worsening of present situation.  The success of the procedure is 50 -90 % at treating patients symptoms.  The patient understands and agrees to proceed.      Pt Education - ABC (After Breast Cancer) Class Info: discussed with patient and provided information.

## 2020-01-02 NOTE — Therapy (Signed)
[place], [place], [address] Phone: [phone]   Fax:  [phone]  Physical Therapy Evaluation  Patient Details  Name: [name] MRN: [id number] Date of Birth: 05/08/1951 Referring Provider (PT): Dr. Erroll Luna   Encounter Date: 01/02/2020  PT End of Session - 01/02/20 1625    Visit Number  1    Number of Visits  2    Date for PT Re-Evaluation  02/27/20    PT Start Time  9753    PT Stop Time  0051   Also saw pt from 1416-1435 for a total of 29 minutes   PT Time Calculation (min)  10 min       Past Medical History:  Diagnosis Date  . Anxiety   . Concussion 3/08   ICU x 3 days  . Depression   . Hypertension   . Spinal stenosis   . Thyroid disease ?1994    Past Surgical History:  Procedure Laterality Date  . BREAST SURGERY Right 4/99   breast biopsy, benign  . SPINAL FUSION  03/04/11   with ORIF    There were no vitals filed for this visit.   Subjective Assessment - 01/02/20 1614    Subjective  Patient reports she is here today to be seen by her medical team for her newly diagnosed right breast cancer.    Pertinent History  Patient was diagnosed on 12/12/2019 with right grade III invasive ductal carcinoma breast cancer. There is a 3.5 cm area of calicfications next to a 1.4 cm mass in the upper inner quadrant. There is also an area of 1.4 cm calcs in the upper outer quadrant. It is triple positive with a Ki67 of 30%. She also has a history of 3 falls in the past 6 months but is currently getting land and aquatic physical therapy at [facility].    Patient Stated Goals  Reduce lymphedema risk and learn post op shoulder ROM HEP    Currently in Pain?  Yes    Pain Score  3     Pain Location  Back    Pain Orientation  Lower    Pain Descriptors / Indicators  Aching    Pain Type  Chronic pain    Pain Onset  More than a month ago    Pain Frequency  Intermittent    Aggravating  Factors   Unknown    Pain Relieving Factors  Unknown         [facility] PT Assessment - 01/02/20 0001      Assessment   Medical Diagnosis  Right breast cancer    Referring Provider (PT)  Dr. Marcello Moores Cornett    Onset Date/Surgical Date  12/12/19    Hand Dominance  Right    Prior Therapy  none      Precautions   Precautions  Other (comment)    Precaution Comments  active cancer      Restrictions   Weight Bearing Restrictions  No      Balance Screen   Has the patient fallen in the past 6 months  Yes    How many times?  3    Has the patient had a decrease in activity level because of a fear of falling?   Yes    Is the patient reluctant to leave their home because of a fear of falling?   Yes   Currently in PT for balance deficits     Home Environment  Living Environment  Private residence    Living Arrangements  Spouse/significant other    Available Help at Discharge  Family      Prior Function   Level of [place]  Retired    Leisure  She does not exercise except in PT      Cognition   Overall Cognitive Status  Within Functional Limits for tasks assessed      Posture/Postural Control   Posture/Postural Control  Postural limitations    Postural Limitations  Rounded Shoulders;Forward head      ROM / Strength   AROM / PROM / Strength  AROM;Strength      AROM   Overall AROM Comments  Cervical AROM is WNL    AROM Assessment Site  Shoulder    Right/Left Shoulder  Right;Left    Right Shoulder Extension  45 Degrees    Right Shoulder Flexion  129 Degrees    Right Shoulder ABduction  142 Degrees    Right Shoulder Internal Rotation  58 Degrees    Right Shoulder External Rotation  76 Degrees    Left Shoulder Extension  45 Degrees    Left Shoulder Flexion  113 Degrees    Left Shoulder ABduction  127 Degrees    Left Shoulder Internal Rotation  50 Degrees    Left Shoulder External Rotation  60 Degrees      Strength   Overall Strength  Within  functional limits for tasks performed        LYMPHEDEMA/ONCOLOGY QUESTIONNAIRE - 01/02/20 1624      Type   Cancer Type  Right breast cancer      Lymphedema Assessments   Lymphedema Assessments  Upper extremities      Right Upper Extremity Lymphedema   10 cm Proximal to Olecranon Process  32.8 cm    Olecranon Process  26.8 cm    10 cm Proximal to Ulnar Styloid Process  23.1 cm    Just Proximal to Ulnar Styloid Process  16.8 cm    Across Hand at [employer]  18.8 cm    At [place] of 2nd Digit  6.2 cm      Left Upper Extremity Lymphedema   10 cm Proximal to Olecranon Process  33.4 cm    Olecranon Process  27.7 cm    10 cm Proximal to Ulnar Styloid Process  23.2 cm    Just Proximal to Ulnar Styloid Process  17.5 cm    Across Hand at [employer]  18.7 cm    At [place] of 2nd Digit  6.5 cm          Quick Dash - 01/02/20 0001    Open a tight or new jar  No difficulty    Do heavy household chores (wash walls, wash floors)  Moderate difficulty    Carry a shopping bag or briefcase  Mild difficulty    Wash your back  Mild difficulty    Use a knife to cut food  No difficulty    Recreational activities in which you take some force or impact through your arm, shoulder, or hand (golf, hammering, tennis)  Mild difficulty    During the past week, to what extent has your arm, shoulder or hand problem interfered with your normal social activities with family, friends, neighbors, or groups?  Not at all    During the past week, to what extent has your arm, shoulder or hand problem limited your work or other  regular daily activities  Not at all    Arm, shoulder, or hand pain.  None    Tingling (pins and needles) in your arm, shoulder, or hand  None    Difficulty Sleeping  No difficulty    DASH Score  11.36 %        Objective measurements completed on examination: See above findings.     Patient was instructed today in a home exercise program today for post op shoulder range of  motion. These included active assist shoulder flexion in sitting, scapular retraction, wall walking with shoulder abduction, and hands behind head external rotation.  She was encouraged to do these twice a day, holding 3 seconds and repeating 5 times when permitted by her physician.       PT Education - 01/02/20 1624    Education Details  Lymphedema risk reduction and post op shoulder ROM HEP    Person(s) Educated  Patient    Methods  Explanation;Demonstration;Handout    Comprehension  Returned demonstration;Verbalized understanding          PT Long Term Goals - 01/02/20 1632      PT LONG TERM GOAL #1   Title  Patient will demonstrate she has regained full shoulder ROM and function post operatively compared to baseline assessments.    Time  8    Period  Weeks    Status  New    Target Date  02/27/20      Breast Clinic Goals - 01/02/20 1631      Patient will be able to verbalize understanding of pertinent lymphedema risk reduction practices relevant to her diagnosis specifically related to skin care.   Time  1    Period  Days    Status  Achieved      Patient will be able to return demonstrate and/or verbalize understanding of the post-op home exercise program related to regaining shoulder range of motion.   Time  1    Period  Days    Status  Achieved      Patient will be able to verbalize understanding of the importance of attending the postoperative After Breast Cancer Class for further lymphedema risk reduction education and therapeutic exercise.   Time  1    Period  Days    Status  Achieved            Plan - 01/02/20 1626    Clinical Impression Statement  Patient was diagnosed on 12/12/2019 with right grade III invasive ductal carcinoma breast cancer. There is a 3.5 cm area of calicfications next to a 1.4 cm mass in the upper inner quadrant. There is also an area of 1.4 cm calcs in the upper outer quadrant. It is triple positive with a Ki67 of 30%. She also has a  history of 3 falls in the past 6 months but is currently getting land and aquatic physical therapy at [facility]. Her multidisciplinary medical team met prior to her assessments to determine a recommended treatment plan. She is planning to have a right sentinel node biopsy followed by chemotherapy, radiation, and anti-estrogen therapy. She will benefit from a post op reassessment to determine needs.    Personal Factors and Comorbidities  Comorbidity 1    Comorbidities  Balance deficits    Examination-Activity Limitations  Locomotion Level;Squat;Stairs   Was unable to get onto examine table   Stability/Clinical Decision Making  Evolving/Moderate complexity    Clinical Decision Making  Moderate    Rehab Potential  Good  PT Frequency  --   Eval and 1 f/u visit   PT Treatment/Interventions  ADLs/Self Care Home Management;Therapeutic exercise;Patient/family education    PT Next Visit Plan  Will reassess 3-4 weeks post op to determine needs    PT Home Exercise Plan  Post op shoulder ROM HEP    Consulted and Agree with Plan of Care  Patient       Patient will benefit from skilled therapeutic intervention in order to improve the following deficits and impairments:  Postural dysfunction, Decreased range of motion, Decreased knowledge of precautions  Visit Diagnosis: Malignant neoplasm of upper-inner quadrant of right breast in female, estrogen receptor positive ([place]) - Plan: PT plan of care cert/re-cert  Abnormal posture - Plan: PT plan of care cert/re-cert  Repeated falls - Plan: PT plan of care cert/re-cert  Difficulty in walking, not elsewhere classified - Plan: PT plan of care cert/re-cert   Patient will follow up at outpatient cancer rehab 3-4 weeks following surgery.  If the patient requires physical therapy at that time, a specific plan will be dictated and sent to the referring physician for approval. The patient was educated today on appropriate basic range of motion exercises to  begin post operatively and the importance of attending the After Breast Cancer class following surgery.  Patient was educated today on lymphedema risk reduction practices as it pertains to recommendations that will benefit the patient immediately following surgery.  She verbalized good understanding.     Problem List Patient Active Problem List   Diagnosis Date Noted  . Malignant neoplasm of upper-inner quadrant of right breast in female, estrogen receptor positive ([place]) 12/26/2019  . Encephalopathy 02/02/2018  . Hypothyroidism 02/02/2018  . AKI (acute kidney injury) ([place]) 02/02/2018  . Chronic back pain 02/02/2018  . Alcohol abuse 02/02/2018  . Weight gain 02/02/2018  . Benign essential HTN 02/02/2018   Annia Friendly, PT 01/02/20 4:35 PM  [place], [place], [address] Phone: [phone]   Fax:  [phone]  Name: SHAWNEEN DEETZ MRN: [id number] Date of Birth: [birth year]

## 2020-01-02 NOTE — Assessment & Plan Note (Signed)
Patient palpated a right breast lump x1wk. Mammogram and US showed two adjacent masses at the 2 o'clock position measuring 1.4cm and 0.6cm, calcifications in the outer right breast at the 9 o'clock position, no right axillary adenopathy. Biopsy showed IDC at the 2 o'clock position, grade 3, HER-2 + (3+), ER+ 90%, PR+ 30%, Ki67 30%, and DCIS in the upper outer right breast, high grade, ER+ 95%, PR 90%.  Pathology and radiology counseling: Discussed with the patient, the details of pathology including the type of breast cancer,the clinical staging, the significance of ER, PR and HER-2/neu receptors and the implications for treatment. After reviewing the pathology in detail, we proceeded to discuss the different treatment options between surgery, radiation, chemotherapy, antiestrogen therapies.  Treatment plan: 1.  Mastectomy with sentinel lymph node biopsy 2.  Adjuvant chemotherapy depending on the final tumor characteristics including this tumor size and lymph nodes.  The size of the tumor is less than 2 cm and negative lymph nodes she will get Taxol Herceptin weekly x12 followed by Herceptin maintenance for 1 year 3.  Plus or minus adjuvant radiation 4.  Followed by antiestrogen therapy  Return to clinic after surgery to discuss the pathology report and finalize a treatment plan

## 2020-01-02 NOTE — H&P (Addendum)
Shanda Bumps Documented: 01/02/2020 7:37 AM Location: [facility] Patient #: [id number] DOB: 1951/01/14 Undefined / Language: [name] / Race: White Female  History of Present Illness Isabel Moores [name]. Jesslyn Viglione MD; 01/02/2020 2:33 PM) Patient words: 68 year old presenting with [name] palpable lump involving the INNER periareolar RIGHT breast which she initially noted approximately 4 weeks AGO. She denies pain discharge or skin change.          CLINICAL DATA: 69 year old presenting with [name] palpable lump involving the INNER periareolar RIGHT breast which she initially noted approximately 1 week ago. Annual evaluation, LEFT breast.  EXAM: DIGITAL DIAGNOSTIC BILATERAL MAMMOGRAM WITH CAD AND TOMO  ULTRASOUND RIGHT BREAST  COMPARISON: Previous exam(s).  ACR Breast Density Category b: There are scattered areas of fibroglandular density.  FINDINGS: Tomosynthesis and synthesized full field CC and MLO views of both breasts were obtained. Tomosynthesis and synthesized spot compression tangential view of the area of concern in the RIGHT breast and spot magnification CC and MLO views of 2 sites of calcifications in the RIGHT breast were also obtained.  Corresponding to the palpable concern in the slight UPPER INNER RIGHT breast at ANTERIOR depth is an approximate 1.5 cm mass with irregular margins associated with possible mild skin retraction. There are suspicious pleomorphic and linear calcifications in the UPPER INNER breast associated with the mass, the calcifications spanning approximately 3.4 x 3.3 x 3.5 cm.  There is [name] second group of pleomorphic and linear calcifications associated with [name] focal asymmetry in the UPPER OUTER QUADRANT of the RIGHT breast at MIDDLE depth, spanning approximately 1.4 x 1.0 x 0.6 cm. This group of calcifications is approximately 8 cm away from the calcifications associated with the mass.  [name] low-density mass is identified in the [place] of the RIGHT breast measuring approximately 6 mm without associated architectural distortion or suspicious calcifications.  No findings suspicious for malignancy in the LEFT breast.  Mammographic images were processed with CAD.  On correlative physical exam, there is [name] firm palpable approximate 2 cm lump in the UPPER INNER periareolar RIGHT breast.  Targeted RIGHT breast ultrasound is performed, showing adjacent hypoechoic masses with irregular margins associated with calcifications at the 2 o'clock position approximately 3 cm from the nipple, measuring in total approximately 1.8 cm. The larger of the 2 masses measures approximately 1.2 x 1.4 x 1.2 cm and the smaller mass measures approximately 0.6 x 0.5 x 0.4 cm. The masses are only separated by 2 mm. Both masses demonstrate posterior acoustic shadowing and internal power Doppler flow.  [name] shadowing focus in the OUTER RIGHT breast at 9 o'clock position approximately 6 cm from the nipple which measures approximately 2 x 3 x 5 mm may correspond to the calcifications in the OUTER RIGHT breast, though these are much more conspicuous on mammography.  At the 10 o'clock position approximately 7 cm from the nipple at MIDDLE depth are adjacent small intramammary lymph nodes each measuring approximately 3 mm, accounting for the mammographic mass.  Sonographic evaluation of the RIGHT axilla demonstrates no pathologic lymphadenopathy.  IMPRESSION: 1. Highly suspicious adjacent masses involving the UPPER INNER QUADRANT of the RIGHT breast at the 2 o'clock position approximately 3 cm from the nipple, associated with suspicious calcifications that span approximately 3.5 cm. 2. Suspicious calcifications involving the UPPER OUTER QUADRANT of the RIGHT breast which span approximately 1.4 cm. These calcifications are approximately 8 cm away from the calcifications associated with the mass. 3. No pathologic RIGHT axillary  lymphadenopathy. 4. No mammographic evidence  of malignancy involving the LEFT breast.  RECOMMENDATION: 1. Ultrasound-guided core needle biopsy of the adjacent masses in the UPPER INNER RIGHT breast. As these masses are separated by only approximately 2 mm, [name] single biopsy will be sufficient. 2. Stereotactic tomosynthesis core needle biopsy of the suspicious calcifications in the UPPER OUTER QUADRANT of the RIGHT breast.  The ultrasound core needle biopsy procedure and the stereotactic core needle biopsy procedure were discussed with the patient and her questions were answered. She wishes to proceed and the biopsies have been scheduled at her convenience.  I have discussed the findings and recommendations with the patient.  BI-RADS CATEGORY 5: Highly suggestive of malignancy.   Electronically Signed By: Evangeline Dakin M.D. On: 12/12/2019 14:48              ADDITIONAL INFORMATION: 1. PROGNOSTIC INDICATORS Results: IMMUNOHISTOCHEMICAL AND MORPHOMETRIC ANALYSIS PERFORMED MANUALLY The tumor cells are POSITIVE for Her2 (3+). Estrogen Receptor: 90%, POSITIVE, STRONG STAINING INTENSITY Progesterone Receptor: 30%, POSITIVE, MODERATE STAINING INTENSITY Proliferation Marker Ki67: 30% REFERENCE RANGE ESTROGEN RECEPTOR NEGATIVE 0% POSITIVE =>1% REFERENCE RANGE PROGESTERONE RECEPTOR NEGATIVE 0% POSITIVE =>1% All controls stained appropriately Isabel Sheller MD Pathologist, Electronic Signature ( Signed 12/25/2019) 2. Immunohistochemical stains for calponin, SMM1, p63 and E-cadherin do not show evidence of invasive ductal or lobular carcinoma. Isabel Folds MD Pathologist, Electronic Signature ( Signed 12/27/2019) 2. PROGNOSTIC INDICATORS 1 of 3 FINAL for [name], [name] [phone]) ADDITIONAL INFORMATION:(continued) Results: IMMUNOHISTOCHEMICAL AND MORPHOMETRIC ANALYSIS PERFORMED MANUALLY Estrogen Receptor: 95%, POSITIVE, STRONG STAINING  INTENSITY Progesterone Receptor: 90%, POSITIVE, STRONG STAINING INTENSITY REFERENCE RANGE ESTROGEN RECEPTOR NEGATIVE 0% POSITIVE =>1% REFERENCE RANGE PROGESTERONE RECEPTOR NEGATIVE 0% POSITIVE =>1% All controls stained appropriately Isabel Sheller MD Pathologist, Electronic Signature ( Signed 12/25/2019) FINAL DIAGNOSIS Diagnosis 1. Breast, right, needle core biopsy, mass, 2 o'clock - INVASIVE DUCTAL CARCINOMA. SEE NOTE 2. Breast, right, needle core biopsy, upper outer - DUCTAL CARCINOMA IN SITU, HIGH-GRADE, WITH NECROSIS AND CALCIFICATIONS. SEE NOTE Diagnosis Note 1. Invasive carcinoma measures 1.1 cm in greatest linear dimension and appears grade 3. [name] breast prognostic profile is pending and will be reported in an addendum. 2. DCIS measures 1.2 cm in greatest linear dimension. [name] breast prognostic profile (ER and PR) is pending and will be reported in an addendum. Dr. Melina Copa has reviewed the case and concurs with the diagnosis. The [place] was notified on 12/24/2019. Isabel Folds MD Pathologist, Electronic Signature.  The patient is [name] 69 year old female.   Past Surgical History Isabel Pummel, RN; 01/02/2020 7:38 AM) Breast Biopsy Right. Shoulder Surgery Right. Spinal Surgery - Lower Back  Diagnostic Studies History Isabel Pummel, RN; 01/02/2020 7:38 AM) Colonoscopy >10 years ago Mammogram within last year Pap Smear 1-5 years ago  Medication History Isabel Pummel, RN; 01/02/2020 7:38 AM) Medications Reconciled  Social History Isabel Pummel, RN; 01/02/2020 7:38 AM) Alcohol use Moderate alcohol use. Caffeine use Coffee. Tobacco use Former smoker.  Family History Isabel Pummel, RN; 01/02/2020 7:38 AM) Alcohol Abuse Father. Diabetes Mellitus Father.  Pregnancy / Birth History Isabel Pummel, RN; 01/02/2020 7:38 AM) Age at menarche 52 years. Age of menopause 49-55 Contraceptive History Oral contraceptives. Gravida  1 Maternal age 16-30 Para 1 Regular periods  Other Problems Isabel Pummel, RN; 01/02/2020 7:38 AM) Anxiety Disorder Arthritis Breast Cancer Thyroid Disease     Review of Systems Isabel Spillers Ledford RN; 01/02/2020 7:38 AM) General Not Present- Appetite Loss, Chills, Fatigue, Fever, Night Sweats, Weight Gain and Weight Loss. Skin Not Present- Change  in Wart/Mole, Dryness, Hives, Jaundice, New Lesions, Non-Healing Wounds, Rash and Ulcer. HEENT Present- Wears glasses/contact lenses. Not Present- Earache, Hearing Loss, Hoarseness, Nose Bleed, Oral Ulcers, Ringing in the Ears, Seasonal Allergies, Sinus Pain, Sore Throat, Visual Disturbances and Yellow Eyes. Respiratory Not Present- Bloody sputum, Chronic Cough, Difficulty Breathing, Snoring and Wheezing. Breast Present- Breast Mass. Not Present- Breast Pain, Nipple Discharge and Skin Changes. Cardiovascular Not Present- Chest Pain, Difficulty Breathing Lying Down, Leg Cramps, Palpitations, Rapid Heart Rate, Shortness of Breath and Swelling of Extremities. Gastrointestinal Not Present- Abdominal Pain, Bloating, Bloody Stool, Change in Bowel Habits, Chronic diarrhea, Constipation, Difficulty Swallowing, Excessive gas, Gets full quickly at meals, Hemorrhoids, Indigestion, Nausea, Rectal Pain and Vomiting. Female Genitourinary Not Present- Frequency, Nocturia, Painful Urination, Pelvic Pain and Urgency. Musculoskeletal Present- Back Pain and Muscle Weakness. Not Present- Joint Pain, Joint Stiffness, Muscle Pain and Swelling of Extremities. Neurological Not Present- Decreased Memory, Fainting, Headaches, Numbness, Seizures, Tingling, Tremor, Trouble walking and Weakness. Psychiatric Present- Anxiety and Depression. Not Present- Bipolar, Change in Sleep Pattern, Fearful and Frequent crying. Endocrine Present- Heat Intolerance. Not Present- Cold Intolerance, Excessive Hunger, Hair Changes, Hot flashes and New Diabetes. Hematology Present- Easy  Bruising. Not Present- Blood Thinners, Excessive bleeding, Gland problems, HIV and Persistent Infections.   Physical Exam (Isabel Cork [name]. Fayette Gasner MD; 01/02/2020 2:35 PM)  General Mental Status-Alert. General Appearance-Consistent with stated age. Hydration-Well hydrated. Voice-Normal.  Chest and Lung Exam Note: WOB normal  Breast Note: mass right breast UOQ from bx mass below NAC 1 cm swollen with bruising left breast normal  Cardiovascular Note: nsr  Neurologic Neurologic evaluation reveals -alert and oriented x 3 with no impairment of recent or remote memory. Mental Status-Normal.  Lymphatic Head & Neck  General Head & Neck Lymphatics: Bilateral - Description - Normal. Axillary  General Axillary Region: Bilateral - Description - Normal. Tenderness - Non Tender.    Assessment & Plan (Isabel Grewe [name]. Cameryn Chrisley MD; 01/02/2020 2:39 PM)  BREAST CANCER, RIGHT (C50.911) Impression: Pt has multifocal disease but is [name] lumpectomy candidate Discussed mastectomy with reconstruction  Pt has chosen lumpectomy x 2 and SLN mapping  Risk of lumpectomy include bleeding, infection, seroma, more surgery, use of seed/wire, wound care, cosmetic deformity and the need for other treatments, death , blood clots, death. Pt agrees to proceed. Risk of sentinel lymph node mapping include bleeding, infection, lymphedema, shoulder pain. stiffness, dye allergy. cosmetic deformity , blood clots, death, need for more surgery. Pt agrees to proceed.  total time face to face reviewing record path radiology studies and documentation 45 minutes  Current Plans You are being scheduled for surgery- Our schedulers will call you.  You should hear from our office's scheduling department within 5 working days about the location, date, and time of surgery. We try to make accommodations for patient's preferences in scheduling surgery, but sometimes the OR schedule or the surgeon's schedule prevents [place] from  making those accommodations.  If you have not heard from our office [phone] in 5 working days, call the office and ask for your surgeon's nurse.  If you have other questions about your diagnosis, plan, or surgery, call the office and ask for your surgeon's nurse.  Pt Education - [facility] Breast Cancer Information Given - [facility] "Breast Journey" Package Pt Education - [facility] Breast Biopsy HCI: discussed with patient and provided information. We discussed the staging and pathophysiology of breast cancer. We discussed all of the different options for treatment for breast cancer including surgery, chemotherapy, radiation therapy, Herceptin, and  antiestrogen therapy. We discussed [name] sentinel lymph node biopsy as she does not appear to having lymph node involvement right now. We discussed the performance of that with injection of radioactive tracer and blue dye. We discussed that she would have an incision underneath her axillary hairline. We discussed that there is [name] bout [name] 10-20% chance of having [name] positive node with [name] sentinel lymph node biopsy and we will await the permanent pathology to make any other first further decisions in terms of her treatment. One of these options might be to return to the operating room to perform an axillary lymph node dissection. We discussed about [name] 1-2% risk lifetime of chronic shoulder pain as well as lymphedema associated with [name] sentinel lymph node biopsy. We discussed the options for treatment of the breast cancer which included lumpectomy versus [name] mastectomy. We discussed the performance of the lumpectomy with [name] wire placement. We discussed [name] 10-20% chance of [name] positive margin requiring reexcision in the operating room. We also discussed that she may need radiation therapy or antiestrogen therapy or both if she undergoes lumpectomy. We discussed the mastectomy and the postoperative care for that as well. We discussed that there is no difference in her survival whether she  undergoes lumpectomy with radiation therapy or antiestrogen therapy versus [name] mastectomy. There is [name] slight difference in the local recurrence rate being 3-5% with lumpectomy and about 1% with [name] mastectomy. We discussed the risks of operation including bleeding, infection, possible reoperation. She understands her further therapy will be based on what her stages at the time of her operation.  We discussed the staging and pathophysiology of breast cancer. We discussed all of the different options for treatment for breast cancer including surgery, chemotherapy, radiation therapy, Herceptin, and antiestrogen therapy. We discussed [name] sentinel lymph node biopsy as she does not appear to having lymph node involvement right now. We discussed the performance of that with injection of radioactive tracer and blue dye. We discussed that she would have an incision underneath her axillary hairline. We discussed that there is [name] bout [name] 10-20% chance of having [name] positive node with [name] sentinel lymph node biopsy and we will await the permanent pathology to make any other first further decisions in terms of her treatment. One of these options might be to return to the operating room to perform an axillary lymph node dissection. We discussed about [name] 1-2% risk lifetime of chronic shoulder pain as well as lymphedema associated with [name] sentinel lymph node biopsy. We discussed the options for treatment of the breast cancer which included lumpectomy versus [name] mastectomy. We discussed the performance of the lumpectomy with [name] wire placement. We discussed [name] 10-20% chance of [name] positive margin requiring reexcision in the operating room. We also discussed that she may need radiation therapy or antiestrogen therapy or both if she undergoes lumpectomy. We discussed the mastectomy and the postoperative care for that as well. We discussed that there is no difference in her survival whether she undergoes lumpectomy with radiation therapy or  antiestrogen therapy versus [name] mastectomy. There is [name] slight difference in the local recurrence rate being 3-5% with lumpectomy and about 1% with [name] mastectomy. We discussed the risks of operation including bleeding, infection, possible reoperation. She understands her further therapy will be based on what her stages at the time of her operation.       Poor venous access - port placement     The procedure has been discussed with the patient.  Alternative therapies have been discussed with the patient.  Operative risks include bleeding,  Infection, collapse lung , injury to mediastinal structures    Organ injury,  Nerve injury,  Blood vessel injury,  DVT,  Pulmonary embolism,  Death,  And possible reoperation.  Medical management risks include worsening of present situation.  The success of the procedure is 50 -90 % at treating patients symptoms.  The patient understands and agrees to proceed.      Pt Education - ABC (After Breast Cancer) Class Info: discussed with patient and provided information.

## 2020-01-02 NOTE — Patient Instructions (Signed)

## 2020-01-04 ENCOUNTER — Other Ambulatory Visit: Payer: Self-pay | Admitting: Surgery

## 2020-01-04 ENCOUNTER — Telehealth: Payer: Self-pay | Admitting: Hematology and Oncology

## 2020-01-04 DIAGNOSIS — C50311 Malignant neoplasm of lower-inner quadrant of right female breast: Secondary | ICD-10-CM

## 2020-01-04 NOTE — Telephone Encounter (Signed)
Scheduled appt per 1/15 sch message - pt is aware of appt date and time   

## 2020-01-09 ENCOUNTER — Encounter: Payer: Self-pay | Admitting: General Practice

## 2020-01-09 NOTE — Progress Notes (Signed)
[facility] Psychosocial Distress Screening Spiritual Care  Left voicemail for [facility] following Breast [facility]isciplinary [facility] to introduce [place] team/resources, reviewing distress screen per protocol.  The patient scored a 5 on the Psychosocial Distress Thermometer which indicates moderate distress.   ONCBCN DISTRESS SCREENING 01/09/2020  Screening Type Initial Screening  Distress experienced in past week (1-10) 5  Emotional problem type Depression;Nervousness/Anxiety;Adjusting to illness;Boredom  Spiritual/Religous concerns type Relating to God;Loss of sense of purpose  Information Concerns Type Lack of info about treatment;Lack of info about complementary therapy choices;Lack of info about maintaining fitness  Physical Problem type Pain;Sleep/insomnia;Getting around;Swollen arms/legs  Referral to support programs Yes    Follow up needed: Yes.  Encouraged callback, but will also phone Ms Mahala later this week if needed.   [place], [place], [facility] Pager [phone] Voicemail [phone]

## 2020-01-10 ENCOUNTER — Encounter: Payer: Self-pay | Admitting: General Practice

## 2020-01-10 ENCOUNTER — Telehealth: Payer: Self-pay | Admitting: *Deleted

## 2020-01-10 DIAGNOSIS — C50211 Malignant neoplasm of upper-inner quadrant of right female breast: Secondary | ICD-10-CM

## 2020-01-10 DIAGNOSIS — Z170 Estrogen receptor positive status [ER+]: Secondary | ICD-10-CM

## 2020-01-10 NOTE — Telephone Encounter (Signed)
Spoke to pt concerning [facility] from 11.3.21. Denies questions or concerns regarding dx or treatment care plan. Encourage pt to call with needs. Received verbal understanding. °

## 2020-01-10 NOTE — Progress Notes (Signed)
[facility] Spiritual Care Note  Followed up with [name] by phone to check in following [facility]. Introduced [facility] manager, provided empathic listening, and normalized feelings and experiences. She reports feeling hopeful at her prognosis, but anxious about treatment. Emphasized virtual programming as a covid-safe way to build community, as she is experiencing distress at the wait for the vaccine.   [name] took my name and number to be able to follow up whenever desired, but please also page if needs arise or circumstances change. Thank you.   [place], [place], [facility] Pager [phone] Voicemail [phone]

## 2020-01-15 ENCOUNTER — Encounter (HOSPITAL_BASED_OUTPATIENT_CLINIC_OR_DEPARTMENT_OTHER): Payer: Self-pay | Admitting: Surgery

## 2020-01-15 ENCOUNTER — Other Ambulatory Visit: Payer: Self-pay

## 2020-01-16 ENCOUNTER — Telehealth: Payer: Self-pay

## 2020-01-16 NOTE — Telephone Encounter (Signed)
Nutrition Assessment  Reason for Assessment:  Pt attended Breast Clinic on 01/02/2020 and was given nutrition packet by nurse navigator.    ASSESSMENT:  69 year old female with new diagnosis of breast cancer.  Past medical history reviewed. Planning double lumpectomies, adjuvant chemotherapy pending final tumor characteristics, adjuvant radiation and antiestrogens.    Spoke with patient via phone to introduce self and service at [facility].  Patient denies questions or concerns at this time   Medications:  reviewed  Labs: reviewed  Anthropometrics:   Height: 67 inches Weight: 236 lb BMI: 37   NUTRITION DIAGNOSIS: Food and nutrition related knowledge deficit related to new diagnosis of breast cancer as evidenced by no prior need for nutrition related information.  INTERVENTION:   Discussed briefly packet of information regarding nutritional tips for breast cancer patients. No questions.  Contact information provided and patient knows to contact me with questions/concerns.    MONITORING, EVALUATION, and GOAL: Pt will consume a healthy plant based diet to maintain lean body mass throughout treatment.   [name] B. Zenia Resides, [place], [place] Registered Dietitian [phone] (pager)

## 2020-01-18 ENCOUNTER — Other Ambulatory Visit (HOSPITAL_COMMUNITY)
Admission: RE | Admit: 2020-01-18 | Discharge: 2020-01-18 | Disposition: A | Discharge status: Home / Self Care | Payer: Medicare PPO | Source: Ambulatory Visit | Attending: Surgery | Admitting: Surgery

## 2020-01-18 ENCOUNTER — Other Ambulatory Visit: Payer: Self-pay

## 2020-01-18 ENCOUNTER — Encounter (HOSPITAL_BASED_OUTPATIENT_CLINIC_OR_DEPARTMENT_OTHER)
Admission: RE | Admit: 2020-01-18 | Discharge: 2020-01-18 | Disposition: A | Discharge status: Home / Self Care | Payer: Medicare PPO | Source: Ambulatory Visit | Attending: Surgery | Admitting: Surgery

## 2020-01-18 DIAGNOSIS — Z20822 Contact with and (suspected) exposure to covid-19: Secondary | ICD-10-CM | POA: Insufficient documentation

## 2020-01-18 DIAGNOSIS — Z01812 Encounter for preprocedural laboratory examination: Secondary | ICD-10-CM | POA: Diagnosis present

## 2020-01-18 LAB — COMPREHENSIVE METABOLIC PANEL
ALT: 21 U/L (ref 0–44)
AST: 21 U/L (ref 15–41)
Albumin: 3.1 g/dL — ABNORMAL LOW (ref 3.5–5.0)
Alkaline Phosphatase: 74 U/L (ref 38–126)
Anion gap: 9 (ref 5–15)
BUN: 23 mg/dL (ref 8–23)
CO2: 25 mmol/L (ref 22–32)
Calcium: 8.7 mg/dL — ABNORMAL LOW (ref 8.9–10.3)
Chloride: 107 mmol/L (ref 98–111)
Creatinine, Ser: 0.73 mg/dL (ref 0.44–1.00)
GFR calc Af Amer: >60 mL/min (ref >60)
GFR calc non Af Amer: >60 mL/min (ref >60)
Glucose, Bld: 108 mg/dL — ABNORMAL HIGH (ref 70–99)
Potassium: 4.1 mmol/L (ref 3.5–5.1)
Sodium: 141 mmol/L (ref 135–145)
Total Bilirubin: 0.4 mg/dL (ref 0.3–1.2)
Total Protein: 6.1 g/dL — ABNORMAL LOW (ref 6.5–8.1)

## 2020-01-18 LAB — CBC WITH DIFFERENTIAL/PLATELET
Abs Immature Granulocytes: 0.06 10*3/uL (ref 0.00–0.07)
Basophils Absolute: 0 10*3/uL (ref 0.0–0.1)
Basophils Relative: 0 %
Eosinophils Absolute: 0.1 10*3/uL (ref 0.0–0.5)
Eosinophils Relative: 1 %
HCT: 37 % (ref 36.0–46.0)
Hemoglobin: 11.8 g/dL — ABNORMAL LOW (ref 12.0–15.0)
Immature Granulocytes: 1 %
Lymphocytes Relative: 23 %
Lymphs Abs: 2.1 10*3/uL (ref 0.7–4.0)
MCH: 31.6 pg (ref 26.0–34.0)
MCHC: 31.9 g/dL (ref 30.0–36.0)
MCV: 99.2 fL (ref 80.0–100.0)
Monocytes Absolute: 0.8 10*3/uL (ref 0.1–1.0)
Monocytes Relative: 9 %
Neutro Abs: 6.1 10*3/uL (ref 1.7–7.7)
Neutrophils Relative %: 66 %
Platelets: 262 10*3/uL (ref 150–400)
RBC: 3.73 MIL/uL — ABNORMAL LOW (ref 3.87–5.11)
RDW: 13.2 % (ref 11.5–15.5)
WBC: 9.2 10*3/uL (ref 4.0–10.5)
nRBC: 0 % (ref 0.0–0.2)

## 2020-01-18 LAB — SARS CORONAVIRUS 2 (TAT 6-24 HRS): SARS Coronavirus 2: NEGATIVE

## 2020-01-18 NOTE — Progress Notes (Signed)

## 2020-01-21 ENCOUNTER — Other Ambulatory Visit: Payer: Self-pay

## 2020-01-21 ENCOUNTER — Ambulatory Visit
Admission: RE | Admit: 2020-01-21 | Discharge: 2020-01-21 | Disposition: A | Discharge status: Home / Self Care | Payer: Medicare PPO | Source: Ambulatory Visit | Attending: Surgery | Admitting: Surgery

## 2020-01-21 DIAGNOSIS — C50311 Malignant neoplasm of lower-inner quadrant of right female breast: Secondary | ICD-10-CM

## 2020-01-21 DIAGNOSIS — Z170 Estrogen receptor positive status [ER+]: Secondary | ICD-10-CM

## 2020-01-22 ENCOUNTER — Ambulatory Visit (HOSPITAL_BASED_OUTPATIENT_CLINIC_OR_DEPARTMENT_OTHER)
Admission: RE | Admit: 2020-01-22 | Discharge: 2020-01-22 | Disposition: A | Discharge status: Home / Self Care | Payer: Medicare PPO | Attending: Surgery | Admitting: Surgery

## 2020-01-22 ENCOUNTER — Other Ambulatory Visit: Payer: Self-pay

## 2020-01-22 ENCOUNTER — Encounter (HOSPITAL_BASED_OUTPATIENT_CLINIC_OR_DEPARTMENT_OTHER): Payer: Self-pay | Admitting: Surgery

## 2020-01-22 ENCOUNTER — Ambulatory Visit (HOSPITAL_BASED_OUTPATIENT_CLINIC_OR_DEPARTMENT_OTHER): Payer: Medicare PPO | Admitting: Anesthesiology

## 2020-01-22 ENCOUNTER — Encounter (HOSPITAL_BASED_OUTPATIENT_CLINIC_OR_DEPARTMENT_OTHER)
Admission: RE | Disposition: A | Discharge status: Home / Self Care | Payer: Self-pay | Source: Home / Self Care | Attending: Surgery

## 2020-01-22 ENCOUNTER — Ambulatory Visit
Admission: RE | Admit: 2020-01-22 | Discharge: 2020-01-22 | Disposition: A | Discharge status: Home / Self Care | Payer: Medicare PPO | Source: Ambulatory Visit | Attending: Surgery | Admitting: Surgery

## 2020-01-22 ENCOUNTER — Ambulatory Visit (HOSPITAL_COMMUNITY): Payer: Medicare PPO

## 2020-01-22 ENCOUNTER — Ambulatory Visit (HOSPITAL_COMMUNITY)
Admission: RE | Admit: 2020-01-22 | Discharge: 2020-01-22 | Disposition: A | Discharge status: Home / Self Care | Payer: Medicare PPO | Source: Ambulatory Visit | Attending: Surgery | Admitting: Surgery

## 2020-01-22 DIAGNOSIS — F419 Anxiety disorder, unspecified: Secondary | ICD-10-CM | POA: Insufficient documentation

## 2020-01-22 DIAGNOSIS — Z170 Estrogen receptor positive status [ER+]: Secondary | ICD-10-CM

## 2020-01-22 DIAGNOSIS — Z79890 Hormone replacement therapy (postmenopausal): Secondary | ICD-10-CM | POA: Insufficient documentation

## 2020-01-22 DIAGNOSIS — I10 Essential (primary) hypertension: Secondary | ICD-10-CM | POA: Insufficient documentation

## 2020-01-22 DIAGNOSIS — Z87891 Personal history of nicotine dependence: Secondary | ICD-10-CM | POA: Diagnosis not present

## 2020-01-22 DIAGNOSIS — E039 Hypothyroidism, unspecified: Secondary | ICD-10-CM | POA: Diagnosis not present

## 2020-01-22 DIAGNOSIS — C50311 Malignant neoplasm of lower-inner quadrant of right female breast: Secondary | ICD-10-CM

## 2020-01-22 DIAGNOSIS — Z79899 Other long term (current) drug therapy: Secondary | ICD-10-CM | POA: Diagnosis not present

## 2020-01-22 DIAGNOSIS — C50211 Malignant neoplasm of upper-inner quadrant of right female breast: Secondary | ICD-10-CM | POA: Diagnosis not present

## 2020-01-22 DIAGNOSIS — Z95828 Presence of other vascular implants and grafts: Secondary | ICD-10-CM

## 2020-01-22 DIAGNOSIS — F329 Major depressive disorder, single episode, unspecified: Secondary | ICD-10-CM | POA: Insufficient documentation

## 2020-01-22 HISTORY — DX: Hypothyroidism, unspecified: E03.9

## 2020-01-22 HISTORY — PX: BREAST LUMPECTOMY WITH RADIOACTIVE SEED AND SENTINEL LYMPH NODE BIOPSY: SHX6550

## 2020-01-22 HISTORY — PX: PORTACATH PLACEMENT: SHX2246

## 2020-01-22 HISTORY — PX: BREAST LUMPECTOMY: SHX2

## 2020-01-22 HISTORY — DX: Other chronic pain: G89.29

## 2020-01-22 SURGERY — BREAST LUMPECTOMY WITH RADIOACTIVE SEED AND SENTINEL LYMPH NODE BIOPSY
Anesthesia: General | Site: Chest | Laterality: Right

## 2020-01-22 MED ORDER — CEFAZOLIN SODIUM-DEXTROSE 2-4 GM/100ML-% IV SOLN
INTRAVENOUS | Status: AC
  Filled 2020-01-22: qty 100

## 2020-01-22 MED ORDER — PHENYLEPHRINE 40 MCG/ML (10ML) SYRINGE FOR IV PUSH (FOR BLOOD PRESSURE SUPPORT)
PREFILLED_SYRINGE | INTRAVENOUS | Status: AC
  Filled 2020-01-22: qty 10

## 2020-01-22 MED ORDER — FENTANYL CITRATE (PF) 100 MCG/2ML IJ SOLN
INTRAMUSCULAR | Status: AC
  Filled 2020-01-22: qty 2

## 2020-01-22 MED ORDER — MIDAZOLAM HCL 2 MG/2ML IJ SOLN
INTRAMUSCULAR | Status: AC
  Filled 2020-01-22: qty 2

## 2020-01-22 MED ORDER — MIDAZOLAM HCL 2 MG/2ML IJ SOLN
1.0000 mg | INTRAMUSCULAR | Status: DC | PRN
  Administered 2020-01-22: 08:00:00 2 mg via INTRAVENOUS

## 2020-01-22 MED ORDER — GABAPENTIN 300 MG PO CAPS
300.0000 mg | ORAL_CAPSULE | ORAL | Status: AC
  Administered 2020-01-22: 08:00:00 300 mg via ORAL

## 2020-01-22 MED ORDER — DEXMEDETOMIDINE HCL IN NACL 200 MCG/50ML IV SOLN
INTRAVENOUS | Status: DC | PRN
  Administered 2020-01-22 (×3): 8 ug via INTRAVENOUS

## 2020-01-22 MED ORDER — EPHEDRINE SULFATE-NACL 50-0.9 MG/10ML-% IV SOSY
PREFILLED_SYRINGE | INTRAVENOUS | Status: DC | PRN
  Administered 2020-01-22: 15 mg via INTRAVENOUS
  Administered 2020-01-22: 10 mg via INTRAVENOUS

## 2020-01-22 MED ORDER — FENTANYL CITRATE (PF) 100 MCG/2ML IJ SOLN
INTRAMUSCULAR | Status: DC | PRN
  Administered 2020-01-22 (×4): 25 ug via INTRAVENOUS

## 2020-01-22 MED ORDER — DEXAMETHASONE SODIUM PHOSPHATE 10 MG/ML IJ SOLN
INTRAMUSCULAR | Status: AC
  Filled 2020-01-22: qty 1

## 2020-01-22 MED ORDER — FENTANYL CITRATE (PF) 100 MCG/2ML IJ SOLN
25.0000 ug | INTRAMUSCULAR | Status: DC | PRN
  Administered 2020-01-22: 12:00:00 25 ug via INTRAVENOUS
  Administered 2020-01-22: 50 ug via INTRAVENOUS
  Administered 2020-01-22: 25 ug via INTRAVENOUS

## 2020-01-22 MED ORDER — BUPIVACAINE HCL (PF) 0.25 % IJ SOLN
INTRAMUSCULAR | Status: DC | PRN
  Administered 2020-01-22: 20 mL

## 2020-01-22 MED ORDER — KETOROLAC TROMETHAMINE 30 MG/ML IJ SOLN
15.0000 mg | Freq: Once | INTRAMUSCULAR | Status: AC
  Administered 2020-01-22: 13:00:00 15 mg via INTRAVENOUS

## 2020-01-22 MED ORDER — ROPIVACAINE HCL 5 MG/ML IJ SOLN
INTRAMUSCULAR | Status: DC | PRN
  Administered 2020-01-22: 20 mL via PERINEURAL

## 2020-01-22 MED ORDER — ACETAMINOPHEN 500 MG PO TABS
1000.0000 mg | ORAL_TABLET | ORAL | Status: AC
  Administered 2020-01-22: 08:00:00 1000 mg via ORAL

## 2020-01-22 MED ORDER — HYDROMORPHONE HCL 1 MG/ML IJ SOLN
INTRAMUSCULAR | Status: AC
  Filled 2020-01-22: qty 0.5

## 2020-01-22 MED ORDER — PROPOFOL 10 MG/ML IV BOLUS
INTRAVENOUS | Status: DC | PRN
  Administered 2020-01-22: 150 mg via INTRAVENOUS
  Administered 2020-01-22: 50 mg via INTRAVENOUS

## 2020-01-22 MED ORDER — DEXAMETHASONE SODIUM PHOSPHATE 10 MG/ML IJ SOLN
INTRAMUSCULAR | Status: DC | PRN
  Administered 2020-01-22: 10 mg via INTRAVENOUS

## 2020-01-22 MED ORDER — CHLORHEXIDINE GLUCONATE CLOTH 2 % EX PADS: 6.0000 | MEDICATED_PAD | Freq: Once | CUTANEOUS | Status: DC

## 2020-01-22 MED ORDER — DEXMEDETOMIDINE HCL IN NACL 200 MCG/50ML IV SOLN
INTRAVENOUS | Status: AC
  Filled 2020-01-22: qty 50

## 2020-01-22 MED ORDER — KETOROLAC TROMETHAMINE 15 MG/ML IJ SOLN: 15.0000 mg | Freq: Once | INTRAMUSCULAR | Status: DC | PRN

## 2020-01-22 MED ORDER — HYDROMORPHONE HCL 1 MG/ML IJ SOLN
0.5000 mg | INTRAMUSCULAR | Status: DC | PRN
  Administered 2020-01-22: 13:00:00 0.5 mg via INTRAVENOUS

## 2020-01-22 MED ORDER — KETOROLAC TROMETHAMINE 30 MG/ML IJ SOLN
INTRAMUSCULAR | Status: AC
  Filled 2020-01-22: qty 1

## 2020-01-22 MED ORDER — LACTATED RINGERS IV SOLN: INTRAVENOUS | Status: DC

## 2020-01-22 MED ORDER — ACETAMINOPHEN 500 MG PO TABS
ORAL_TABLET | ORAL | Status: AC
  Filled 2020-01-22: qty 2

## 2020-01-22 MED ORDER — HEPARIN (PORCINE) IN NACL 2-0.9 UNITS/ML
INTRAMUSCULAR | Status: AC | PRN
  Administered 2020-01-22: 1

## 2020-01-22 MED ORDER — PHENYLEPHRINE 40 MCG/ML (10ML) SYRINGE FOR IV PUSH (FOR BLOOD PRESSURE SUPPORT)
PREFILLED_SYRINGE | INTRAVENOUS | Status: DC | PRN
  Administered 2020-01-22: 80 ug via INTRAVENOUS
  Administered 2020-01-22 (×2): 120 ug via INTRAVENOUS
  Administered 2020-01-22: 80 ug via INTRAVENOUS
  Administered 2020-01-22: 120 ug via INTRAVENOUS
  Administered 2020-01-22: 40 ug via INTRAVENOUS
  Administered 2020-01-22: 120 ug via INTRAVENOUS

## 2020-01-22 MED ORDER — TRAMADOL HCL 50 MG PO TABS: 50.0000 mg | ORAL_TABLET | Freq: Four times a day (QID) | ORAL | 0 refills | Status: DC | PRN

## 2020-01-22 MED ORDER — ONDANSETRON HCL 4 MG/2ML IJ SOLN
INTRAMUSCULAR | Status: DC | PRN
  Administered 2020-01-22: 4 mg via INTRAVENOUS

## 2020-01-22 MED ORDER — ONDANSETRON HCL 4 MG/2ML IJ SOLN
INTRAMUSCULAR | Status: AC
  Filled 2020-01-22: qty 2

## 2020-01-22 MED ORDER — BUPIVACAINE LIPOSOME 1.3 % IJ SUSP
INTRAMUSCULAR | Status: DC | PRN
  Administered 2020-01-22: 10 mL via PERINEURAL

## 2020-01-22 MED ORDER — EPHEDRINE 5 MG/ML INJ
INTRAVENOUS | Status: AC
  Filled 2020-01-22: qty 10

## 2020-01-22 MED ORDER — PROMETHAZINE HCL 25 MG/ML IJ SOLN: 6.2500 mg | INTRAMUSCULAR | Status: DC | PRN

## 2020-01-22 MED ORDER — LIDOCAINE 2% (20 MG/ML) 5 ML SYRINGE
INTRAMUSCULAR | Status: DC | PRN
  Administered 2020-01-22: 80 mg via INTRAVENOUS

## 2020-01-22 MED ORDER — CEFAZOLIN SODIUM-DEXTROSE 2-4 GM/100ML-% IV SOLN: 2.0000 g | INTRAVENOUS | Status: DC

## 2020-01-22 MED ORDER — HEPARIN SOD (PORK) LOCK FLUSH 100 UNIT/ML IV SOLN
INTRAVENOUS | Status: DC | PRN
  Administered 2020-01-22: 400 [IU] via INTRAVENOUS

## 2020-01-22 MED ORDER — GABAPENTIN 300 MG PO CAPS
ORAL_CAPSULE | ORAL | Status: AC
  Filled 2020-01-22: qty 1

## 2020-01-22 MED ORDER — CEFAZOLIN SODIUM-DEXTROSE 2-4 GM/100ML-% IV SOLN
2.0000 g | INTRAVENOUS | Status: AC
  Administered 2020-01-22: 09:00:00 2 g via INTRAVENOUS

## 2020-01-22 MED ORDER — FENTANYL CITRATE (PF) 100 MCG/2ML IJ SOLN
50.0000 ug | INTRAMUSCULAR | Status: DC | PRN
  Administered 2020-01-22 (×2): 100 ug via INTRAVENOUS

## 2020-01-22 MED ORDER — LIDOCAINE 2% (20 MG/ML) 5 ML SYRINGE
INTRAMUSCULAR | Status: AC
  Filled 2020-01-22: qty 5

## 2020-01-22 SURGICAL SUPPLY — 77 items
ADH SKN CLS APL DERMABOND .7 (GAUZE/BANDAGES/DRESSINGS) ×4
APL PRP STRL LF DISP 70% ISPRP (MISCELLANEOUS) ×4
APL SKNCLS STERI-STRIP NONHPOA (GAUZE/BANDAGES/DRESSINGS)
APPLIER CLIP 9.375 MED OPEN (MISCELLANEOUS) ×3
APR CLP MED 9.3 20 MLT OPN (MISCELLANEOUS) ×2
BAG DECANTER FOR FLEXI CONT (MISCELLANEOUS) ×3 IMPLANT
BENZOIN TINCTURE PRP APPL 2/3 (GAUZE/BANDAGES/DRESSINGS) IMPLANT
BINDER BREAST 3XL (GAUZE/BANDAGES/DRESSINGS) ×1 IMPLANT
BINDER BREAST XXLRG (GAUZE/BANDAGES/DRESSINGS) IMPLANT
BLADE HEX COATED 2.75 (ELECTRODE) ×2 IMPLANT
BLADE SURG 11 STRL SS (BLADE) ×3 IMPLANT
BLADE SURG 15 STRL LF DISP TIS (BLADE) ×2 IMPLANT
BLADE SURG 15 STRL SS (BLADE) ×6
CANISTER SUC SOCK COL 7IN (MISCELLANEOUS) IMPLANT
CANISTER SUCT 1200ML W/VALVE (MISCELLANEOUS) ×3 IMPLANT
CHLORAPREP W/TINT 26 (MISCELLANEOUS) ×4 IMPLANT
CLIP APPLIE 9.375 MED OPEN (MISCELLANEOUS) ×2 IMPLANT
COVER BACK TABLE 60X90IN (DRAPES) ×3 IMPLANT
COVER MAYO STAND STRL (DRAPES) ×3 IMPLANT
COVER PROBE 5X48 (MISCELLANEOUS) ×3
COVER PROBE W GEL 5X96 (DRAPES) ×3 IMPLANT
COVER WAND RF STERILE (DRAPES) IMPLANT
DECANTER SPIKE VIAL GLASS SM (MISCELLANEOUS) IMPLANT
DERMABOND ADVANCED (GAUZE/BANDAGES/DRESSINGS) ×2
DERMABOND ADVANCED .7 DNX12 (GAUZE/BANDAGES/DRESSINGS) ×2 IMPLANT
DRAPE C-ARM 42X72 X-RAY (DRAPES) ×3 IMPLANT
DRAPE LAPAROSCOPIC ABDOMINAL (DRAPES) ×4 IMPLANT
DRAPE UTILITY XL STRL (DRAPES) ×4 IMPLANT
DRSG TEGADERM 2-3/8X2-3/4 SM (GAUZE/BANDAGES/DRESSINGS) IMPLANT
DRSG TEGADERM 4X4.75 (GAUZE/BANDAGES/DRESSINGS) IMPLANT
ELECT COATED BLADE 2.86 ST (ELECTRODE) ×3 IMPLANT
ELECT REM PT RETURN 9FT ADLT (ELECTROSURGICAL) ×3
ELECTRODE REM PT RTRN 9FT ADLT (ELECTROSURGICAL) ×2 IMPLANT
GAUZE SPONGE 4X4 12PLY STRL LF (GAUZE/BANDAGES/DRESSINGS) IMPLANT
GLOVE BIO SURGEON STRL SZ 6.5 (GLOVE) ×1 IMPLANT
GLOVE BIOGEL PI IND STRL 6.5 (GLOVE) IMPLANT
GLOVE BIOGEL PI IND STRL 8 (GLOVE) ×2 IMPLANT
GLOVE BIOGEL PI INDICATOR 6.5 (GLOVE) ×2
GLOVE BIOGEL PI INDICATOR 8 (GLOVE) ×2
GLOVE ECLIPSE 8.0 STRL XLNG CF (GLOVE) ×4 IMPLANT
GLOVE NEODERM STER SZ 7 (GLOVE) ×1 IMPLANT
GOWN STRL REUS W/ TWL LRG LVL3 (GOWN DISPOSABLE) ×4 IMPLANT
GOWN STRL REUS W/TWL LRG LVL3 (GOWN DISPOSABLE) ×12
HEMOSTAT ARISTA ABSORB 3G PWDR (HEMOSTASIS) IMPLANT
HEMOSTAT SNOW SURGICEL 2X4 (HEMOSTASIS) ×1 IMPLANT
IV KIT MINILOC 20X1 SAFETY (NEEDLE) IMPLANT
KIT CVR 48X5XPRB PLUP LF (MISCELLANEOUS) ×2 IMPLANT
KIT MARKER MARGIN INK (KITS) ×3 IMPLANT
KIT PORT POWER 8FR ISP CVUE (Port) ×1 IMPLANT
NDL HYPO 25X1 1.5 SAFETY (NEEDLE) ×2 IMPLANT
NDL SAFETY ECLIPSE 18X1.5 (NEEDLE) IMPLANT
NDL SPNL 22GX3.5 QUINCKE BK (NEEDLE) IMPLANT
NEEDLE HYPO 18GX1.5 SHARP (NEEDLE)
NEEDLE HYPO 22GX1.5 SAFETY (NEEDLE) IMPLANT
NEEDLE HYPO 25X1 1.5 SAFETY (NEEDLE) ×3 IMPLANT
NEEDLE SPNL 22GX3.5 QUINCKE BK (NEEDLE) IMPLANT
NS IRRIG 1000ML POUR BTL (IV SOLUTION) ×3 IMPLANT
PACK BASIN DAY SURGERY FS (CUSTOM PROCEDURE TRAY) ×3 IMPLANT
PENCIL SMOKE EVACUATOR (MISCELLANEOUS) ×3 IMPLANT
SET SHEATH INTRODUCER 10FR (MISCELLANEOUS) IMPLANT
SHEATH COOK PEEL AWAY SET 9F (SHEATH) IMPLANT
SLEEVE SCD COMPRESS KNEE MED (MISCELLANEOUS) ×3 IMPLANT
SPONGE LAP 4X18 RFD (DISPOSABLE) ×7 IMPLANT
STRIP CLOSURE SKIN 1/2X4 (GAUZE/BANDAGES/DRESSINGS) IMPLANT
SUT MNCRL AB 4-0 PS2 18 (SUTURE) ×6 IMPLANT
SUT MON AB 4-0 PC3 18 (SUTURE) ×2 IMPLANT
SUT PROLENE 2 0 CT2 30 (SUTURE) IMPLANT
SUT PROLENE 2 0 SH DA (SUTURE) ×3 IMPLANT
SUT SILK 2 0 TIES 17X18 (SUTURE)
SUT SILK 2-0 18XBRD TIE BLK (SUTURE) IMPLANT
SUT VICRYL 3-0 CR8 SH (SUTURE) ×5 IMPLANT
SYR 5ML LUER SLIP (SYRINGE) ×3 IMPLANT
SYR CONTROL 10ML LL (SYRINGE) ×3 IMPLANT
TOWEL GREEN STERILE FF (TOWEL DISPOSABLE) ×6 IMPLANT
TRAY FAXITRON CT DISP (TRAY / TRAY PROCEDURE) ×3 IMPLANT
TUBE CONNECTING 20X1/4 (TUBING) ×3 IMPLANT
YANKAUER SUCT BULB TIP NO VENT (SUCTIONS) ×3 IMPLANT

## 2020-01-22 NOTE — Anesthesia Postprocedure Evaluation (Signed)
Anesthesia Post Note  Patient: [name]  Procedure(s) Performed: RIGHT BREAST LUMPECTOMY WITH RADIOACTIVE SEED X2 AND RIGHT SENTINEL LYMPH NODE MAPPING (Right Breast) INSERTION PORT-A-CATH WITH ULTRASOUND (Right Chest)     Patient location during evaluation: PACU Anesthesia Type: General and Regional Level of consciousness: awake and alert, oriented and patient cooperative Pain management: pain level controlled Vital Signs Assessment: post-procedure vital signs reviewed and stable Respiratory status: spontaneous breathing, nonlabored ventilation and respiratory function stable Cardiovascular status: blood pressure returned to baseline and stable Postop Assessment: no apparent nausea or vomiting Anesthetic complications: no    Last Vitals:  Vitals:   01/22/20 0840 01/22/20 1136  BP:  123/69  Pulse: 84 88  Resp: 15 12  Temp:  36.7 C  SpO2: 98% 98%    Last Pain:  Vitals:   01/22/20 1136  TempSrc:   PainSc: [place]

## 2020-01-22 NOTE — Anesthesia Procedure Notes (Signed)
Anesthesia Regional Block: Pectoralis block   Pre-Anesthetic Checklist: ,, timeout performed, Correct Patient, Correct Site, Correct Laterality, Correct Procedure, Correct Position, site marked, Risks and benefits discussed,  Surgical consent,  Pre-op evaluation,  At surgeon's request and post-op pain management  Laterality: Right  Prep: Maximum Sterile Barrier Precautions used, chloraprep       Needles:  Injection technique: Single-shot  Needle Type: Echogenic Stimulator Needle     Needle Length: 9cm  Needle Gauge: 22     Additional Needles:   Procedures:,,,, ultrasound used (permanent image in chart),,,,  Narrative:  Start time: 01/22/2020 8:30 AM End time: 01/22/2020 8:35 AM Injection made incrementally with aspirations every 5 mL.  Performed by: Personally  Anesthesiologist: Pervis Hocking, DO  Additional Notes: Monitors applied. No increased pain on injection. No increased resistance to injection. Injection made in 5cc increments. Good needle visualization. Patient tolerated procedure well.

## 2020-01-22 NOTE — Anesthesia Preprocedure Evaluation (Addendum)
Anesthesia Evaluation  Patient identified by MRN, date of birth, ID band Patient awake    Reviewed: Allergy & Precautions, NPO status , Patient's Chart, lab work & pertinent test results  Airway Mallampati: II  TM Distance: >3 FB Neck ROM: Full    Dental no notable dental hx. (+) Teeth Intact, Dental Advisory Given   Pulmonary former smoker,    Pulmonary exam normal breath sounds clear to auscultation       Cardiovascular hypertension, Pt. on medications Normal cardiovascular exam Rhythm:Regular Rate:Normal     Neuro/Psych PSYCHIATRIC DISORDERS Anxiety Depression negative neurological ROS     GI/Hepatic negative GI ROS, Neg liver ROS,   Endo/Other  negative endocrine ROSHypothyroidism   Renal/GU negative Renal ROS  negative genitourinary   Musculoskeletal Spinal stenosis s/p fusion 2012   Abdominal (+) + obese,   Peds negative pediatric ROS (+)  Hematology negative hematology ROS (+)   Anesthesia Other Findings Right breast ca  Reproductive/Obstetrics negative OB ROS                             Anesthesia Physical Anesthesia Plan  ASA: II  Anesthesia Plan: General and Regional   Post-op Pain Management: GA combined w/ Regional for post-op pain   Induction: Intravenous  PONV Risk Score and Plan: 3 and Ondansetron, Dexamethasone, Midazolam and Treatment may vary due to age or medical condition  Airway Management Planned: LMA  Additional Equipment: None  Intra-op Plan:   Post-operative Plan: Extubation in OR  Informed Consent: I have reviewed the patients History and Physical, chart, labs and discussed the procedure including the risks, benefits and alternatives for the proposed anesthesia with the patient or authorized representative who has indicated his/her understanding and acceptance.     Dental advisory given  Plan Discussed with: CRNA  Anesthesia Plan Comments:         Anesthesia Quick Evaluation

## 2020-01-22 NOTE — Progress Notes (Signed)
Assisted Dr. Doroteo Glassman with right, ultrasound guided, pectoralis block. Side rails up, monitors on throughout procedure. See vital signs in flow sheet. Tolerated Procedure well.

## 2020-01-22 NOTE — Interval H&P Note (Signed)
History and Physical Interval Note:  01/22/2020 9:08 AM  [name]  has presented today for surgery, with the diagnosis of RIGHT BREAST CANCER.  The various methods of treatment have been discussed with the patient and family. After consideration of risks, benefits and other options for treatment, the patient has consented to  Procedure(s): RIGHT BREAST LUMPECTOMY WITH RADIOACTIVE SEED X2 AND RIGHT SENTINEL LYMPH NODE MAPPING (Right) INSERTION PORT-A-CATH WITH ULTRASOUND (N/A) as a surgical intervention.  The patient's history has been reviewed, patient examined, no change in status, stable for surgery.  I have reviewed the patient's chart and labs.  Questions were answered to the patient's satisfaction.     [place]

## 2020-01-22 NOTE — Progress Notes (Signed)
Emotional support during breast injections °

## 2020-01-22 NOTE — Addendum Note (Signed)
Addendum  created 01/22/20 1259 by Pervis Hocking, DO   Order list changed

## 2020-01-22 NOTE — Transfer of Care (Signed)
Immediate Anesthesia Transfer of Care Note  Patient: [name]  Procedure(s) Performed: RIGHT BREAST LUMPECTOMY WITH RADIOACTIVE SEED X2 AND RIGHT SENTINEL LYMPH NODE MAPPING (Right Breast) INSERTION PORT-A-CATH WITH ULTRASOUND (Right Chest)  Patient Location: PACU  Anesthesia Type:General  Level of Consciousness: sedated  Airway & Oxygen Therapy: Patient Spontanous Breathing and Patient connected to face mask oxygen  Post-op Assessment: Report given to RN and Post -op Vital signs reviewed and stable  Post vital signs: Reviewed and stable  Last Vitals:  Vitals Value Taken Time  BP 123/69 01/22/20 1134  Temp    Pulse 87 01/22/20 1137  Resp 12 01/22/20 1137  SpO2 98 % 01/22/20 1137  Vitals shown include unvalidated device data.  Last Pain:  Vitals:   01/22/20 0753  TempSrc: Oral  PainSc: 2       Patients Stated Pain Goal: 2 ([id number])  Complications: No apparent anesthesia complications

## 2020-01-22 NOTE — Discharge Instructions (Signed)
[facility] Office Phone Number [phone]  BREAST BIOPSY/ PARTIAL MASTECTOMY: POST OP INSTRUCTIONS  Always review your discharge instruction sheet given to you by the facility where your surgery was performed.  IF YOU HAVE DISABILITY OR FAMILY LEAVE FORMS, YOU MUST BRING THEM TO THE OFFICE FOR PROCESSING.  DO NOT GIVE THEM TO YOUR DOCTOR.  1. A prescription for pain medication may be given to you upon discharge.  Take your pain medication as prescribed, if needed.  If narcotic pain medicine is not needed, then you may take acetaminophen (Tylenol) or ibuprofen (Advil) as needed. 2. Take your usually prescribed medications unless otherwise directed 3. If you need a refill on your pain medication, please contact your pharmacy.  They will contact our office to request authorization.  Prescriptions will not be filled after 5pm or on week-ends. 4. You should eat very light the first 24 hours after surgery, such as soup, crackers, pudding, etc.  Resume your normal diet the day after surgery. 5. Most patients will experience some swelling and bruising in the breast.  Ice packs and a good support bra will help.  Swelling and bruising can take several days to resolve.  6. It is common to experience some constipation if taking pain medication after surgery.  Increasing fluid intake and taking a stool softener will usually help or prevent this problem from occurring.  A mild laxative (Milk of Magnesia or Miralax) should be taken according to package directions if there are no bowel movements after 48 hours. 7. Unless discharge instructions indicate otherwise, you may remove your bandages 24-48 hours after surgery, and you may shower at that time.  You may have steri-strips (small skin tapes) in place directly over the incision.  These strips should be left on the skin for 7-10 days.  If your surgeon used skin glue on the incision, you may shower in 24 hours.  The glue will flake off over the  next 2-3 weeks.  Any sutures or staples will be removed at the office during your follow-up visit. 8. ACTIVITIES:  You may resume regular daily activities (gradually increasing) beginning the next day.  Wearing a good support bra or sports bra minimizes pain and swelling.  You may have sexual intercourse when it is comfortable. a. You may drive when you no longer are taking prescription pain medication, you can comfortably wear a seatbelt, and you can safely maneuver your car and apply brakes. b. RETURN TO WORK:  ______________________________________________________________________________________ 9. You should see your doctor in the office for a follow-up appointment approximately two weeks after your surgery.  Your doctor's nurse will typically make your follow-up appointment when she calls you with your pathology report.  Expect your pathology report 2-3 business days after your surgery.  You may call to check if you do not hear from us after three days. 10. OTHER INSTRUCTIONS: _______________________________________________________________________________________________ _____________________________________________________________________________________________________________________________________ _____________________________________________________________________________________________________________________________________ _____________________________________________________________________________________________________________________________________  WHEN TO CALL YOUR DOCTOR: 1. Fever over 101.0 2. Nausea and/or vomiting. 3. Extreme swelling or bruising. 4. Continued bleeding from incision. 5. Increased pain, redness, or drainage from the incision.  The clinic staff is available to answer your questions during regular business hours.  Please don't hesitate to call and ask to speak to one of the nurses for clinical concerns.  If you have a medical emergency, go to the nearest  emergency room or call 911.  A surgeon from [facility] is always on call at the hospital.  For further questions, please visit [email or web address]        PORT-A-CATH: POST OP INSTRUCTIONS  Always review your discharge instruction sheet given to you by the facility where your surgery was performed.   1. A prescription for pain medication may be given to you upon discharge. Take your pain medication as prescribed, if needed. If narcotic pain medicine is not needed, then you make take acetaminophen (Tylenol) or ibuprofen (Advil) as needed.  2. Take your usually prescribed medications unless otherwise directed. 3. If you need a refill on your pain medication, please contact our office. All narcotic pain medicine now requires a paper prescription.  Phoned in and fax refills are no longer allowed by law.  Prescriptions will not be filled after 5 pm or on weekends.  4. You should follow a light diet for the remainder of the day after your procedure. 5. Most patients will experience some mild swelling and/or bruising in the area of the incision. It may take several days to resolve. 6. It is common to experience some constipation if taking pain medication after surgery. Increasing fluid intake and taking a stool softener (such as Colace) will usually help or prevent this problem from occurring. A mild laxative (Milk of Magnesia or Miralax) should be taken according to package directions if there are no bowel movements after 48 hours.  7. Unless discharge instructions indicate otherwise, you may remove your bandages 48 hours after surgery, and you may shower at that time. You may have steri-strips (small white skin tapes) in place directly over the incision.  These strips should be left on the skin for 7-10 days.  If your surgeon used Dermabond (skin glue) on the incision, you may shower in 24 hours.  The glue will flake off over the next 2-3 weeks.  8. If your port is left accessed at the  end of surgery (needle left in port), the dressing cannot get wet and should only by changed by a healthcare professional. When the port is no longer accessed (when the needle has been removed), follow step 7.   9. ACTIVITIES:  Limit activity involving your arms for the next 72 hours. Do no strenuous exercise or activity for 1 week. You may drive when you are no longer taking prescription pain medication, you can comfortably wear a seatbelt, and you can maneuver your car. 10.You may need to see your doctor in the office for a follow-up appointment.  Please       check with your doctor.  11.When you receive a new Port-a-Cath, you will get a product guide and        ID card.  Please keep them in case you need them.  WHEN TO CALL YOUR DOCTOR [phone]): 1. Fever over 101.0 2. Chills 3. Continued bleeding from incision 4. Increased redness and tenderness at the site 5. Shortness of breath, difficulty breathing   The clinic staff is available to answer your questions during regular business hours. Please don't hesitate to call and ask to speak to one of the nurses or medical assistants for clinical concerns. If you have a medical emergency, go to the nearest emergency room or call 911.  A surgeon from [facility] is always on call at the hospital.     For further information, please visit [email or web address]  Post Anesthesia Home Care Instructions  Activity: Get plenty of rest for the remainder of the day. A responsible individual must stay with you for 24 hours following the procedure.  For the next 24 hours, DO NOT: -Drive a car Film/video editor -Drink  alcoholic beverages -Take any medication unless instructed by your physician -Make any legal decisions or sign important papers.  Meals: Start with liquid foods such as gelatin or soup. Progress to regular foods as tolerated. Avoid greasy, spicy, heavy foods. If nausea and/or vomiting occur, drink only clear  liquids until the nausea and/or vomiting subsides. Call your physician if vomiting continues.  Special Instructions/Symptoms: Your throat may feel dry or sore from the anesthesia or the breathing tube placed in your throat during surgery. If this causes discomfort, gargle with warm salt water. The discomfort should disappear within 24 hours.  If you had a scopolamine patch placed behind your ear for the management of post- operative nausea and/or vomiting:  1. The medication in the patch is effective for 72 hours, after which it should be removed.  Wrap patch in a tissue and discard in the trash. Wash hands thoroughly with soap and water. 2. You may remove the patch earlier than 72 hours if you experience unpleasant side effects which may include dry mouth, dizziness or visual disturbances. 3. Avoid touching the patch. Wash your hands with soap and water after contact with the patch.  Information for Discharge Teaching: EXPAREL (bupivacaine liposome injectable suspension)   Your surgeon or anesthesiologist gave you EXPAREL(bupivacaine) to help control your pain after surgery.   EXPAREL is a local anesthetic that provides pain relief by numbing the tissue around the surgical site.  EXPAREL is designed to release pain medication over time and can control pain for up to 72 hours.  Depending on how you respond to EXPAREL, you may require less pain medication during your recovery.  Possible side effects:  Temporary loss of sensation or ability to move in the area where bupivacaine was injected.  Nausea, vomiting, constipation  Rarely, numbness and tingling in your mouth or lips, lightheadedness, or anxiety may occur.  Call your doctor right away if you think you may be experiencing any of these sensations, or if you have other questions regarding possible side effects.  Follow all other discharge instructions given to you by your surgeon or nurse. Eat a healthy diet and drink plenty of  water or other fluids.  If you return to the hospital for any reason within 96 hours following the administration of EXPAREL, it is important for health care providers to know that you have received this anesthetic. A teal colored band has been placed on your arm with the date, time and amount of EXPAREL you have received in order to alert and inform your health care providers. Please leave this armband in place for the full 96 hours following administration, and then you may remove the band.    Tylenol may be given again at 2:00pm. Ibuprofen may be given again at 6:00pm.

## 2020-01-22 NOTE — Op Note (Signed)
Preoperative diagnosis: multifocal right breast cancer /poor venous access  Postoperative diagnosis: Same  Procedure:  Right breast seed lumpectomy times two and right sentinel lymph node mapping; Portacath Placement with U/S   C arm   Surgeon: Turner Daniels, MD, FACS  Anesthesia: General and 0.25 % marcaine with epinephrine  Clinical History and Indications:   Patient presents for right breast lumpectomy x2 with right axillary sentinel mapping for stage I multifocal right breast cancer.  She opted of breast conservation.  She requires a Port-A-Cath for postoperative chemotherapy as well.  She was seen in the [facility].The procedure has been discussed with the patient. Alternatives to surgery have been discussed with the patient.  Risks of surgery include bleeding,  Infection,  Seroma formation, death,  and the need for further surgery.   The patient understands and wishes to proceed.Sentinel lymph node mapping and dissection has been discussed with the patient.  Risk of bleeding,  Infection,  Seroma formation,  Additional procedures,,  Shoulder weakness ,  Shoulder stiffness,  Nerve and blood vessel injury and reaction to the mapping dyes have been discussed.  Alternatives to surgery have been discussed with the patient.  The patient agrees to proceed.      The patient is getting ready to begin chemotherapy for her cancer. She  needs a Port-A-Cath for venous access. Risk of bleeding, infection,  Collapse lung,  Death,  DVT,  Organ injury,  Mediastinal injury,  Injury to heart,  Injury to blood vessels,  Nerves,  Migration of catheter,  Embolization of catheter and the need for more surgery.  Description of Procedure: I have seen the patient in the holding area and confirmed the plans for the procedure as noted above. I reviewed the risks and complications again and the patient has no further questions. She wishes to proceed.  Neoprobe used to mark seed location times two. Underwent nuc med  injection and pec block placed.     The patient was then taken to the operating room. After satisfactory general  anesthesia had been obtained the upper chest and lower neck were prepped and draped as a sterile field. The timeout was done.  The right internal jugular vein  was entered under U/S guidance  and the guidewire threaded into the superior vena cava right atrial area under fluoroscopic guidance. An incision was then made on the anterior chest wall and a subcutaneous pocket fashioned for the port reservoir.  The port tubing was then brought through a subcutaneous tunnel from the port site to the guidewire site.  The port and catheter were attached, locked  and flushed. The catheter was measured and cut to appropriate length.The dilator and peel-away sheath were then advanced over the guidewire while monitoring this with fluoroscopy. The guidewire and dilator were removed and the tubing threaded to approximately 20 cm. The peel-away sheath was then removed. The catheter aspirated and flushed easily. Using fluoroscopy the tip was in the superior vena cava right atrial junction area. It aspirated and flushed easily. That aspirated and flushed easily.  The reservoir was secured to the fascia with 1 sutures of 2-0 Prolene. A final check with fluoroscopy was done to make sure we had no kinks and good positioning of the tip of the catheter. Everything appeared to be okay. The catheter was aspirated, flushed with dilute heparin and then concentrated aqueous heparin.  The incision was then closed with interrupted 3-0 Vicryl, and 4-0 Monocryl subcuticular with Dermabond on the skin.   The patient  was reprepped and redraped.  Timeout was performed.  Neoprobe used to identify both seeds in the right breast and these were marked with a pen.  1 was medial at about 1:00 the second was lateral at 9-10 o'clock.  Incision made over the medial lesion with help the neoprobe.  All tissue around the seed and clip  were excised with a grossly negative margin.  The Faxitron image revealed the seed and clip to be located centrally.  The cavity was irrigated made hemostatic with cautery.  It was clipped and then closed with 3-0 Vicryl for Monocryl.  In a similar fashion the right lateral lesion was identified the neoprobe.  Incision made over the lesion with the help of the neoprobe.  All tissue and the seed and clip were excised with a grossly negative margin.  Upon inspection the anterior margin appeared close and this was reexcised.  All specimens were oriented with ink and sent to pathology.  Irrigation was used.  Cavity closed with 3-0 Vicryl for Monocryl for ensuring hemostasis.  Neoprobe settings were changed to technetium.  Hotspot identified the right axilla.  Incision made in the right axilla dissection carried down to the level 1 nodal basin.  There were 3 hot nodes identified and removed.  Background counts approaches 0.  Hemostasis was achieved and wound closed in layers with 3-0 Vicryl and 4 Monocryl.  Dermabond applied.  Breast binder placed.  All final counts were correct.  The patient was awoke extubated taken recovery in satisfactory condition.  Turner Daniels, MD, FACS

## 2020-01-22 NOTE — Anesthesia Procedure Notes (Signed)
Procedure Name: LMA Insertion Date/Time: 01/22/2020 9:24 AM Performed by: Genelle Bal, CRNA Pre-anesthesia Checklist: Patient identified, Emergency Drugs available, Suction available and Patient being monitored Patient Re-evaluated:Patient Re-evaluated prior to induction Oxygen Delivery Method: Circle system utilized Preoxygenation: Pre-oxygenation with 100% oxygen Induction Type: IV induction Ventilation: Mask ventilation without difficulty LMA: LMA inserted LMA Size: 4.0 Number of attempts: 1 Airway Equipment and Method: Bite block Placement Confirmation: positive ETCO2 Tube secured with: Tape Dental Injury: Teeth and Oropharynx as per pre-operative assessment

## 2020-01-23 ENCOUNTER — Encounter: Payer: Self-pay | Admitting: *Deleted

## 2020-01-23 NOTE — Addendum Note (Signed)
Addendum  created 01/23/20 0656 by Genelle Bal, CRNA   Intraprocedure Event edited

## 2020-01-24 ENCOUNTER — Encounter: Payer: Self-pay | Admitting: *Deleted

## 2020-01-24 LAB — SURGICAL PATHOLOGY

## 2020-01-28 ENCOUNTER — Encounter: Payer: Self-pay | Admitting: *Deleted

## 2020-01-28 NOTE — Progress Notes (Signed)
Patient Care Team: Maurice Small, MD as PCP - General (Family Medicine) Rockwell Germany, RN as Oncology Nurse Navigator Tressie Ellis, Paulette Blanch, RN as Oncology Nurse Navigator Erroll Luna, MD as Consulting Physician (General Surgery) Nicholas Lose, MD as Consulting Physician (Hematology and Oncology) Gery Pray, MD as Consulting Physician (Radiation Oncology)  DIAGNOSIS:    ICD-10-CM   1. Malignant neoplasm of upper-inner quadrant of right breast in female, estrogen receptor positive ([place])  C50.211 ECHOCARDIOGRAM COMPLETE   Z17.0     SUMMARY OF ONCOLOGIC HISTORY: Oncology History  Malignant neoplasm of upper-inner quadrant of right breast in female, estrogen receptor positive ([place])  12/26/2019 Initial Diagnosis   Patient palpated a right breast lump x1wk. Mammogram and US showed two adjacent masses at the 2 o'clock position measuring 1.4cm and 0.6cm, calcifications in the outer right breast at the 9 o'clock position, no right axillary adenopathy. Biopsy showed IDC at the 2 o'clock position, grade 3, HER-2 + (3+), ER+ 90%, PR+ 30%, Ki67 30%, and DCIS in the upper outer right breast, high grade, ER+ 95%, PR 90%.   01/22/2020 Surgery   Right breast lumpectomy x2 ([facility]): Medial position: IDC, grade 3, 2.3cm, with high grade DCIS, clear margins Lateral position: high grade DCIS, clear margins, 4 right axillary lymph nodes negative      CHIEF COMPLIANT: Follow-up s/p lumpectomy x2 to review pathology   INTERVAL HISTORY: [name] is a 69 y.o. with above-mentioned history of right breast cancer. She underwent a right breast lumpectomy x2 on 01/22/20 with Dr. Brantley Stage for which pathology showed, in the right medial position, invasive ductal carcinoma, grade 3, 2.3cm, with high grade DCIS, clear margins, and at the right lateral position, an isolated focus of high grade DCIS, clear margins, 4 right axillary lymph nodes negative for carcinoma. She presents to the clinic today to review the  pathology report and discuss further treatment.  She is recovering very well from recent surgery.  She does have bruises on her arms.  She tells me that she bruises very easily.  She is under a lot of pain in the axilla.  ALLERGIES:  is allergic to [employer] tartrate].  MEDICATIONS:  Current Outpatient Medications  Medication Sig Dispense Refill  . amitriptyline (ELAVIL) 25 MG tablet Take 25 mg by mouth at bedtime. Pt takes 3 31m tablets (75 mg total) once a day    . amLODipine (NORVASC) 5 MG tablet Take 7.5 mg by mouth daily.    . Buprenorphine HCl (BELBUCA) 150 MCG FILM Place inside cheek.    . cholecalciferol (VITAMIN D) 1000 units tablet Take 2,000 Units by mouth daily.    . Cyanocobalamin (VITAMIN B 12 PO) Take 1 tablet by mouth daily.    .Marland KitchenFLUoxetine (PROZAC) 40 MG capsule Take 40 mg by mouth daily.     .Marland Kitchenibuprofen (ADVIL,MOTRIN) 200 MG tablet Take 800 mg by mouth 2 (two) times daily as needed for moderate pain.    .Marland Kitchenlevothyroxine (SYNTHROID, LEVOTHROID) 137 MCG tablet Take 137 mcg by mouth daily before breakfast.    . traMADol (ULTRAM) 50 MG tablet Take 1 tablet (50 mg total) by mouth every 6 (six) hours as needed. 30 tablet 0   No current facility-administered medications for this visit.    PHYSICAL EXAMINATION: ECOG PERFORMANCE STATUS: 1 - Symptomatic but completely ambulatory  Vitals:   01/29/20 1618  BP: 137/87  Pulse: (!) 104  Resp: 18  SpO2: 96%   Filed Weights   01/29/20 1618  Weight: 237 lb 14.4 oz (107.9 kg)    LABORATORY DATA:  I have reviewed the data as listed CMP Latest Ref Rng & Units 01/18/2020 01/02/2020 02/02/2018  Glucose 70 - 99 mg/dL 108(H) 91 111(H)  BUN 8 - 23 mg/dL '23 22 19  ' Creatinine 0.44 - 1.00 mg/dL 0.73 0.83 0.95  Sodium 135 - 145 mmol/L 141 143 143  Potassium 3.5 - 5.1 mmol/L 4.1 4.1 3.6  Chloride 98 - 111 mmol/L 107 106 112(H)  CO2 22 - 32 mmol/L '25 26 23  ' Calcium 8.9 - 10.3 mg/dL 8.7(L) 8.6(L) 7.7(L)  Total Protein 6.5 - 8.1  g/dL 6.1(L) 7.0 -  Total Bilirubin 0.3 - 1.2 mg/dL 0.4 0.3 -  Alkaline Phos 38 - 126 U/L 74 94 -  AST 15 - 41 U/L 21 14(L) -  ALT 0 - 44 U/L 21 16 -    Lab Results  Component Value Date   WBC 9.2 01/18/2020   HGB 11.8 (L) 01/18/2020   HCT 37.0 01/18/2020   MCV 99.2 01/18/2020   PLT 262 01/18/2020   NEUTROABS 6.1 01/18/2020    ASSESSMENT & PLAN:  Malignant neoplasm of upper-inner quadrant of right breast in female, estrogen receptor positive ([place]) 01/22/2020: Right medial lumpectomy: Grade 3 IDC 2.3 cm with high-grade DCIS with necrosis, margins negative, negative for lymphovascular or perineural invasion, Right lateral lumpectomy: Isolated foci of DCIS high-grade, resection margins negative 4 lymph nodes negative, ER 90%, PR 30%, HER-2 3+ positive, Ki-67 30%  Pathology counseling: I discussed the final pathology report of the patient provided  a copy of this report. I discussed the margins as well as lymph node surgeries. We also discussed the final staging along with previously performed ER/PR and HER-2/neu testing.  Treatment plan: 1.  Adjuvant chemotherapy with Taxol Herceptin weekly x12 followed by Herceptin maintenance 2.  Adjuvant radiation therapy 3.  Followed by adjuvant antiestrogen therapy.  Chemo counseling: Discussed with extensively the risks and benefits of chemotherapy and she is willing to proceed. Echocardiogram and chemo class have been ordered. Pain in the axilla: I refilled her prescription for Ultram.  Return to clinic in 3 weeks to start chemotherapy.    Orders Placed This Encounter  Procedures  . ECHOCARDIOGRAM COMPLETE    Standing Status:   Future    Standing Expiration Date:   04/27/2021    Order Specific Question:   Where should this test be performed    Answer:   [facility]    Order Specific Question:   Perflutren DEFINITY (image enhancing agent) should be administered unless hypersensitivity or allergy exist    Answer:   Administer Perflutren     Order Specific Question:   Reason for exam-Echo    Answer:   Chemo  V67.2 / Z09   The patient has a good understanding of the overall plan. she agrees with it. she will call with any problems that may develop before the next visit here.  Total time spent: 40 mins including face to face time and time spent for planning, charting and coordination of care  Nicholas Lose, MD 01/29/2020  I, Cloyde Reams Dorshimer, am acting as scribe for Dr. Nicholas Lose.  I have reviewed the above documentation for accuracy and completeness, and I agree with the above.

## 2020-01-29 ENCOUNTER — Inpatient Hospital Stay: Payer: Medicare PPO | Attending: Hematology and Oncology | Admitting: Hematology and Oncology

## 2020-01-29 ENCOUNTER — Other Ambulatory Visit: Payer: Self-pay

## 2020-01-29 VITALS — BP 137/87 | HR 104 | Resp 18 | Ht 67.0 in | Wt 237.9 lb

## 2020-01-29 DIAGNOSIS — Z170 Estrogen receptor positive status [ER+]: Secondary | ICD-10-CM | POA: Diagnosis not present

## 2020-01-29 DIAGNOSIS — M79629 Pain in unspecified upper arm: Secondary | ICD-10-CM | POA: Diagnosis not present

## 2020-01-29 DIAGNOSIS — C50211 Malignant neoplasm of upper-inner quadrant of right female breast: Secondary | ICD-10-CM

## 2020-01-29 MED ORDER — TRAMADOL HCL 50 MG PO TABS: 50.0000 mg | ORAL_TABLET | Freq: Four times a day (QID) | ORAL | 0 refills | Status: DC | PRN

## 2020-01-29 NOTE — Assessment & Plan Note (Signed)
01/22/2020: Right medial lumpectomy: Grade 3 IDC 2.3 cm with high-grade DCIS with necrosis, margins negative, negative for lymphovascular or perineural invasion, Right lateral lumpectomy: Isolated foci of DCIS high-grade, resection margins negative 4 lymph nodes negative, ER 90%, PR 30%, HER-2 3+ positive, Ki-67 30%  Pathology counseling: I discussed the final pathology report of the patient provided  a copy of this report. I discussed the margins as well as lymph node surgeries. We also discussed the final staging along with previously performed ER/PR and HER-2/neu testing.  Treatment plan: 1.  Adjuvant chemotherapy with TCHP x6 cycles followed by Herceptin Perjeta maintenance 2.  Adjuvant radiation therapy 3.  Followed by adjuvant antiestrogen therapy.  Return to clinic in 3 weeks to start chemotherapy.

## 2020-01-30 ENCOUNTER — Encounter: Payer: Self-pay | Admitting: *Deleted

## 2020-01-30 ENCOUNTER — Telehealth: Payer: Self-pay | Admitting: Hematology and Oncology

## 2020-01-30 MED ORDER — ONDANSETRON HCL 8 MG PO TABS: 8.0000 mg | ORAL_TABLET | Freq: Two times a day (BID) | ORAL | 1 refills | Status: DC | PRN

## 2020-01-30 MED ORDER — PROCHLORPERAZINE MALEATE 10 MG PO TABS: 10.0000 mg | ORAL_TABLET | Freq: Four times a day (QID) | ORAL | 1 refills | Status: DC | PRN

## 2020-01-30 MED ORDER — LORAZEPAM 0.5 MG PO TABS: 0.5000 mg | ORAL_TABLET | Freq: Every evening | ORAL | 0 refills | Status: DC | PRN

## 2020-01-30 MED ORDER — LIDOCAINE-PRILOCAINE 2.5-2.5 % EX CREA: TOPICAL_CREAM | CUTANEOUS | 3 refills | Status: DC

## 2020-01-30 NOTE — Telephone Encounter (Signed)
I talk with patient regarding schedule  

## 2020-01-30 NOTE — Progress Notes (Signed)
START ON PATHWAY REGIMEN - Breast     Cycle 1: A cycle is 7 days:     Trastuzumab-xxxx      Paclitaxel    Cycles 2 through 12: A cycle is every 7 days:     Trastuzumab-xxxx      Paclitaxel    Cycles 13 through 25: A cycle is every 21 days:     Trastuzumab-xxxx   **Always confirm dose/schedule in your pharmacy ordering system**  Patient Characteristics: Postoperative without Neoadjuvant Therapy (Pathologic Staging), Invasive Disease, Adjuvant Therapy, HER2 Positive, ER Positive, Node Negative, pT2, pN0, Tumor Size ?  3 cm Therapeutic Status: Postoperative without Neoadjuvant Therapy (Pathologic Staging) AJCC Grade: G3 AJCC N Category: pN0 AJCC M Category: cM0 ER Status: Positive (+) AJCC 8 Stage Grouping: IA HER2 Status: Positive (+) Oncotype Dx Recurrence Score: Not Appropriate AJCC T Category: pT2 PR Status: Positive (+) Intent of Therapy: Curative Intent, Discussed with Patient

## 2020-02-04 ENCOUNTER — Ambulatory Visit (HOSPITAL_COMMUNITY)
Admission: RE | Admit: 2020-02-04 | Discharge: 2020-02-04 | Disposition: A | Discharge status: Home / Self Care | Payer: Medicare PPO | Source: Ambulatory Visit | Attending: Hematology and Oncology | Admitting: Hematology and Oncology

## 2020-02-04 ENCOUNTER — Inpatient Hospital Stay: Payer: Medicare PPO

## 2020-02-04 ENCOUNTER — Other Ambulatory Visit: Payer: Self-pay

## 2020-02-04 DIAGNOSIS — Z170 Estrogen receptor positive status [ER+]: Secondary | ICD-10-CM | POA: Diagnosis present

## 2020-02-04 DIAGNOSIS — C50211 Malignant neoplasm of upper-inner quadrant of right female breast: Secondary | ICD-10-CM | POA: Insufficient documentation

## 2020-02-04 DIAGNOSIS — I358 Other nonrheumatic aortic valve disorders: Secondary | ICD-10-CM | POA: Insufficient documentation

## 2020-02-04 NOTE — Progress Notes (Addendum)
Echocardiogram 2D Echocardiogram has been performed.  [name] 02/04/2020, 10:52 AM   Patient left cell phone in echo lab, returned to [facility] front desk.

## 2020-02-13 ENCOUNTER — Telehealth: Payer: Self-pay | Admitting: *Deleted

## 2020-02-14 ENCOUNTER — Ambulatory Visit: Payer: Medicare PPO | Admitting: Physical Therapy

## 2020-02-18 ENCOUNTER — Encounter: Payer: Self-pay | Admitting: Hematology and Oncology

## 2020-02-18 NOTE — Progress Notes (Signed)
Patient Care Team: Maurice Small, MD as PCP - General (Family Medicine) Rockwell Germany, RN as Oncology Nurse Navigator Tressie Ellis, Paulette Blanch, RN as Oncology Nurse Navigator Erroll Luna, MD as Consulting Physician (General Surgery) Nicholas Lose, MD as Consulting Physician (Hematology and Oncology) Gery Pray, MD as Consulting Physician (Radiation Oncology)  DIAGNOSIS:    ICD-10-CM   1. Malignant neoplasm of upper-inner quadrant of right breast in female, estrogen receptor positive ([place])  C50.211    Z17.0     SUMMARY OF ONCOLOGIC HISTORY: Oncology History  Malignant neoplasm of upper-inner quadrant of right breast in female, estrogen receptor positive ([place])  12/26/2019 Initial Diagnosis   Patient palpated a right breast lump x1wk. Mammogram and US showed two adjacent masses at the 2 o'clock position measuring 1.4cm and 0.6cm, calcifications in the outer right breast at the 9 o'clock position, no right axillary adenopathy. Biopsy showed IDC at the 2 o'clock position, grade 3, HER-2 + (3+), ER+ 90%, PR+ 30%, Ki67 30%, and DCIS in the upper outer right breast, high grade, ER+ 95%, PR 90%.   01/22/2020 Surgery   Right breast lumpectomy x2 ([facility]): Medial position: IDC, grade 3, 2.3cm, with high grade DCIS, clear margins Lateral position: high grade DCIS, clear margins, 4 right axillary lymph nodes negative    01/22/2020 Cancer Staging   Staging form: Breast, AJCC 8th Edition - Pathologic stage from 01/22/2020: Stage IA (pT2, pN0, cM0, G3, ER+, PR+, HER2+) - Signed by Gardenia Phlegm, NP on 02/06/2020   02/19/2020 -  Chemotherapy   The patient had PACLitaxel (TAXOL) 180 mg in sodium chloride 0.9 % 250 mL chemo infusion (</= 73m/m2), 80 mg/m2 = 180 mg, Intravenous,  Once, 0 of 3 cycles trastuzumab-anns (KANJINTI) 441 mg in sodium chloride 0.9 % 250 mL chemo infusion, 4 mg/kg = 441 mg (100 % of original dose 4 mg/kg), Intravenous,  Once, 0 of 16 cycles Dose modification: 4 mg/kg  (original dose 4 mg/kg, Cycle 1, Reason: Other (see comments)), 6 mg/kg (Cycle 4, Reason: Other (see comments), Comment: insurance preferred biosimilar), 6 mg/kg (Cycle 10, Reason: Other (see comments))  for chemotherapy treatment.      CHIEF COMPLIANT: Cycle 1 Taxol Herceptin  INTERVAL HISTORY: Isabel RUGGIEROis a 69y.o. with above-mentioned history of right breast cancer who underwent a right breast lumpectomy x2 and is currently on adjuvant chemotherapy with Taxol Herceptin. Echo on 02/04/20 showed an ejection fraction of 60-65%. She presents to the clinic today for cycle 1.  Unfortunately today her port has flipped and she will need this to be revised. She tells me that she has not been able to sleep for the past week because of the chemotherapy anxiety.  The lorazepam has not helped her.  ALLERGIES:  is allergic to [employer]].  MEDICATIONS:  Current Outpatient Medications  Medication Sig Dispense Refill  . amitriptyline (ELAVIL) 25 MG tablet Take 25 mg by mouth at bedtime. Pt takes 3 281mtablets (75 mg total) once a day    . amLODipine (NORVASC) 5 MG tablet Take 7.5 mg by mouth daily.    . Buprenorphine HCl (BELBUCA) 150 MCG FILM Place inside cheek.    . cholecalciferol (VITAMIN D) 1000 units tablet Take 2,000 Units by mouth daily.    . Cyanocobalamin (VITAMIN B 12 PO) Take 1 tablet by mouth daily.    . Marland KitchenLUoxetine (PROZAC) 40 MG capsule Take 40 mg by mouth daily.     . Marland Kitchenbuprofen (ADVIL,MOTRIN) 200 MG tablet  Take 800 mg by mouth 2 (two) times daily as needed for moderate pain.    Marland Kitchen levothyroxine (SYNTHROID, LEVOTHROID) 137 MCG tablet Take 137 mcg by mouth daily before breakfast.    . lidocaine-prilocaine (EMLA) cream Apply to affected area once 30 g 3  . LORazepam (ATIVAN) 0.5 MG tablet Take 1 tablet (0.5 mg total) by mouth at bedtime as needed for sleep. 30 tablet 0  . ondansetron (ZOFRAN) 8 MG tablet Take 1 tablet (8 mg total) by mouth 2 (two) times daily as needed  (Nausea or vomiting). 30 tablet 1  . prochlorperazine (COMPAZINE) 10 MG tablet Take 1 tablet (10 mg total) by mouth every 6 (six) hours as needed (Nausea or vomiting). 30 tablet 1  . traMADol (ULTRAM) 50 MG tablet Take 1 tablet (50 mg total) by mouth every 6 (six) hours as needed. 30 tablet 0   No current facility-administered medications for this visit.   Facility-Administered Medications Ordered in Other Visits  Medication Dose Route Frequency Provider Last Rate Last Admin  . sodium chloride flush (NS) 0.9 % injection 10 mL  10 mL Intravenous PRN Nicholas Lose, MD        PHYSICAL EXAMINATION: ECOG PERFORMANCE STATUS: 1 - Symptomatic but completely ambulatory  There were no vitals filed for this visit. There were no vitals filed for this visit.  LABORATORY DATA:  I have reviewed the data as listed CMP Latest Ref Rng & Units 01/18/2020 01/02/2020 02/02/2018  Glucose 70 - 99 mg/dL 108(H) 91 111(H)  BUN 8 - 23 mg/dL '23 22 19  ' Creatinine 0.44 - 1.00 mg/dL 0.73 0.83 0.95  Sodium 135 - 145 mmol/L 141 143 143  Potassium 3.5 - 5.1 mmol/L 4.1 4.1 3.6  Chloride 98 - 111 mmol/L 107 106 112(H)  CO2 22 - 32 mmol/L '25 26 23  ' Calcium 8.9 - 10.3 mg/dL 8.7(L) 8.6(L) 7.7(L)  Total Protein 6.5 - 8.1 g/dL 6.1(L) 7.0 -  Total Bilirubin 0.3 - 1.2 mg/dL 0.4 0.3 -  Alkaline Phos 38 - 126 U/L 74 94 -  AST 15 - 41 U/L 21 14(L) -  ALT 0 - 44 U/L 21 16 -    Lab Results  Component Value Date   WBC 7.9 02/19/2020   HGB 13.1 02/19/2020   HCT 40.0 02/19/2020   MCV 96.6 02/19/2020   PLT 235 02/19/2020   NEUTROABS 5.6 02/19/2020    ASSESSMENT & PLAN:  Malignant neoplasm of upper-inner quadrant of right breast in female, estrogen receptor positive ([place]) 01/22/2020: Right medial lumpectomy: Grade 3 IDC 2.3 cm with high-grade DCIS with necrosis, margins negative, negative for lymphovascular or perineural invasion, Right lateral lumpectomy: Isolated foci of DCIS high-grade, resection margins negative 4 lymph  nodes negative, ER 90%, PR 30%, HER-2 3+ positive, Ki-67 30%  Treatment plan: 1.  Adjuvant chemotherapy with Taxol Herceptin weekly x12 followed by Herceptin maintenance 2.  Adjuvant radiation therapy 3.  Followed by adjuvant antiestrogen therapy. ----------------------------------------------------------------------------------------------------------------------------------------------------------- Current treatment: Cycle 1 day 1 Taxol Herceptin Echocardiogram 02/04/2020: EF 60 to 65% Antiemetics were reviewed, labs reviewed Chemo consent obtained, chemo education completed  We will try to treat her with the peripheral IV today. We will request surgery to evaluate her port.  Return to clinic in 1 week for toxicity check   No orders of the defined types were placed in this encounter.  The patient has a good understanding of the overall plan. she agrees with it. she will call with any problems that may develop before  the next visit here.  Total time spent: 30 mins including face to face time and time spent for planning, charting and coordination of care  Nicholas Lose, MD 02/19/2020  I, Cloyde Reams Dorshimer, am acting as scribe for Dr. Nicholas Lose.  I have reviewed the above documentation for accuracy and completeness, and I agree with the above.

## 2020-02-18 NOTE — Progress Notes (Signed)
Called pt to introduce myself as her Financial Resource Specialist and to discuss the [employer] grant.  Unfortunately there aren't any foundations offering copay assistance for her Dx and the type of ins she has.  I left a msg requesting she return my call if she would like to apply for the grant. 

## 2020-02-19 ENCOUNTER — Encounter: Payer: Self-pay | Admitting: *Deleted

## 2020-02-19 ENCOUNTER — Inpatient Hospital Stay: Payer: Medicare PPO

## 2020-02-19 ENCOUNTER — Inpatient Hospital Stay (HOSPITAL_BASED_OUTPATIENT_CLINIC_OR_DEPARTMENT_OTHER): Payer: Medicare PPO | Admitting: Hematology and Oncology

## 2020-02-19 ENCOUNTER — Inpatient Hospital Stay: Payer: Medicare PPO | Attending: Hematology and Oncology

## 2020-02-19 ENCOUNTER — Other Ambulatory Visit: Payer: Self-pay

## 2020-02-19 ENCOUNTER — Inpatient Hospital Stay (HOSPITAL_BASED_OUTPATIENT_CLINIC_OR_DEPARTMENT_OTHER): Payer: Medicare PPO | Admitting: Medical

## 2020-02-19 VITALS — BP 151/81 | HR 87 | Temp 98.3°F | Resp 18

## 2020-02-19 DIAGNOSIS — Z5112 Encounter for antineoplastic immunotherapy: Secondary | ICD-10-CM | POA: Diagnosis present

## 2020-02-19 DIAGNOSIS — Z170 Estrogen receptor positive status [ER+]: Secondary | ICD-10-CM

## 2020-02-19 DIAGNOSIS — C50211 Malignant neoplasm of upper-inner quadrant of right female breast: Secondary | ICD-10-CM

## 2020-02-19 DIAGNOSIS — Z5111 Encounter for antineoplastic chemotherapy: Secondary | ICD-10-CM | POA: Diagnosis present

## 2020-02-19 DIAGNOSIS — Z95828 Presence of other vascular implants and grafts: Secondary | ICD-10-CM

## 2020-02-19 LAB — CBC WITH DIFFERENTIAL (CANCER CENTER ONLY)
Abs Immature Granulocytes: 0.01 10*3/uL (ref 0.00–0.07)
Basophils Absolute: 0 10*3/uL (ref 0.0–0.1)
Basophils Relative: 0 %
Eosinophils Absolute: 0.1 10*3/uL (ref 0.0–0.5)
Eosinophils Relative: 1 %
HCT: 40 % (ref 36.0–46.0)
Hemoglobin: 13.1 g/dL (ref 12.0–15.0)
Immature Granulocytes: 0 %
Lymphocytes Relative: 19 %
Lymphs Abs: 1.5 10*3/uL (ref 0.7–4.0)
MCH: 31.6 pg (ref 26.0–34.0)
MCHC: 32.8 g/dL (ref 30.0–36.0)
MCV: 96.6 fL (ref 80.0–100.0)
Monocytes Absolute: 0.7 10*3/uL (ref 0.1–1.0)
Monocytes Relative: 9 %
Neutro Abs: 5.6 10*3/uL (ref 1.7–7.7)
Neutrophils Relative %: 71 %
Platelet Count: 235 10*3/uL (ref 150–400)
RBC: 4.14 MIL/uL (ref 3.87–5.11)
RDW: 13.2 % (ref 11.5–15.5)
WBC Count: 7.9 10*3/uL (ref 4.0–10.5)
nRBC: 0 % (ref 0.0–0.2)

## 2020-02-19 LAB — CMP (CANCER CENTER ONLY)
ALT: 17 U/L (ref 0–44)
AST: 17 U/L (ref 15–41)
Albumin: 3.2 g/dL — ABNORMAL LOW (ref 3.5–5.0)
Alkaline Phosphatase: 96 U/L (ref 38–126)
Anion gap: 9 (ref 5–15)
BUN: 17 mg/dL (ref 8–23)
CO2: 28 mmol/L (ref 22–32)
Calcium: 8.8 mg/dL — ABNORMAL LOW (ref 8.9–10.3)
Chloride: 105 mmol/L (ref 98–111)
Creatinine: 0.97 mg/dL (ref 0.44–1.00)
GFR, Est AFR Am: >60 mL/min (ref >60)
GFR, Estimated: 59 mL/min — ABNORMAL LOW (ref >60)
Glucose, Bld: 118 mg/dL — ABNORMAL HIGH (ref 70–99)
Potassium: 4.4 mmol/L (ref 3.5–5.1)
Sodium: 142 mmol/L (ref 135–145)
Total Bilirubin: 0.3 mg/dL (ref 0.3–1.2)
Total Protein: 6.3 g/dL — ABNORMAL LOW (ref 6.5–8.1)

## 2020-02-19 MED ORDER — TRASTUZUMAB-ANNS CHEMO 150 MG IV SOLR
4.0000 mg/kg | Freq: Once | INTRAVENOUS | Status: AC
  Administered 2020-02-19: 441 mg via INTRAVENOUS
  Filled 2020-02-19: qty 21

## 2020-02-19 MED ORDER — DIAZEPAM 10 MG PO TABS: 10.0000 mg | ORAL_TABLET | Freq: Every evening | ORAL | 2 refills | Status: DC | PRN

## 2020-02-19 MED ORDER — DIPHENHYDRAMINE HCL 50 MG/ML IJ SOLN
25.0000 mg | Freq: Once | INTRAMUSCULAR | Status: AC
  Administered 2020-02-19: 13:00:00 25 mg via INTRAVENOUS

## 2020-02-19 MED ORDER — SODIUM CHLORIDE 0.9% FLUSH
10.0000 mL | INTRAVENOUS | Status: DC | PRN
  Filled 2020-02-19: qty 10

## 2020-02-19 MED ORDER — FAMOTIDINE IN NACL 20-0.9 MG/50ML-% IV SOLN
20.0000 mg | Freq: Once | INTRAVENOUS | Status: AC
  Administered 2020-02-19: 20 mg via INTRAVENOUS

## 2020-02-19 MED ORDER — ACETAMINOPHEN 325 MG PO TABS
ORAL_TABLET | ORAL | Status: AC
  Filled 2020-02-19: qty 2

## 2020-02-19 MED ORDER — DIPHENHYDRAMINE HCL 50 MG/ML IJ SOLN
INTRAMUSCULAR | Status: AC
  Filled 2020-02-19: qty 1

## 2020-02-19 MED ORDER — ACETAMINOPHEN 325 MG PO TABS
650.0000 mg | ORAL_TABLET | Freq: Once | ORAL | Status: AC
  Administered 2020-02-19: 650 mg via ORAL

## 2020-02-19 MED ORDER — FAMOTIDINE IN NACL 20-0.9 MG/50ML-% IV SOLN
INTRAVENOUS | Status: AC
  Filled 2020-02-19: qty 50

## 2020-02-19 MED ORDER — DEXAMETHASONE SODIUM PHOSPHATE 10 MG/ML IJ SOLN
10.0000 mg | Freq: Once | INTRAMUSCULAR | Status: AC
  Administered 2020-02-19: 10 mg via INTRAVENOUS

## 2020-02-19 MED ORDER — DEXAMETHASONE SODIUM PHOSPHATE 10 MG/ML IJ SOLN
INTRAMUSCULAR | Status: AC
  Filled 2020-02-19: qty 1

## 2020-02-19 MED ORDER — SODIUM CHLORIDE 0.9 % IV SOLN: 10.0000 mg | Freq: Once | INTRAVENOUS | Status: DC

## 2020-02-19 MED ORDER — SODIUM CHLORIDE 0.9 % IV SOLN
80.0000 mg/m2 | Freq: Once | INTRAVENOUS | Status: AC
  Administered 2020-02-19: 180 mg via INTRAVENOUS
  Filled 2020-02-19: qty 30

## 2020-02-19 MED ORDER — SODIUM CHLORIDE 0.9 % IV SOLN
Freq: Once | INTRAVENOUS | Status: AC
  Filled 2020-02-19: qty 250

## 2020-02-19 NOTE — Patient Instructions (Signed)

## 2020-02-19 NOTE — Assessment & Plan Note (Signed)
01/22/2020: Right medial lumpectomy: Grade 3 IDC 2.3 cm with high-grade DCIS with necrosis, margins negative, negative for lymphovascular or perineural invasion, Right lateral lumpectomy: Isolated foci of DCIS high-grade, resection margins negative 4 lymph nodes negative, ER 90%, PR 30%, HER-2 3+ positive, Ki-67 30%  Treatment plan: 1.  Adjuvant chemotherapy with Taxol Herceptin weekly x12 followed by Herceptin maintenance 2.  Adjuvant radiation therapy 3.  Followed by adjuvant antiestrogen therapy. ----------------------------------------------------------------------------------------------------------------------------------------------------------- Current treatment: Cycle 1 day 1 Taxol Herceptin Echocardiogram 02/04/2020: EF 60 to 65% Antiemetics were reviewed, labs reviewed Chemo consent obtained, chemo education completed Return to clinic in 1 week for toxicity check  

## 2020-02-19 NOTE — Patient Instructions (Signed)
COVID-19 Vaccine Information can be found at: [email or web address] For questions related to vaccine distribution or appointments, please email [email or web address] or call [phone].   [facility] Discharge Instructions for Patients Receiving Chemotherapy and Immunotherapy  Today you received the following chemotherapy and immunotherapy agents: Trastuzumab-anns (Kanjinti) and Paclitaxel (Taxol)  To help prevent nausea and vomiting after your treatment, we encourage you to take your nausea medication as directed by your provider.    If you develop nausea and vomiting that is not controlled by your nausea medication, call the clinic.   BELOW ARE SYMPTOMS THAT SHOULD BE REPORTED IMMEDIATELY:  *FEVER GREATER THAN 100.5 F  *CHILLS WITH OR WITHOUT FEVER  NAUSEA AND VOMITING THAT IS NOT CONTROLLED WITH YOUR NAUSEA MEDICATION  *UNUSUAL SHORTNESS OF BREATH  *UNUSUAL BRUISING OR BLEEDING  TENDERNESS IN MOUTH AND THROAT WITH OR WITHOUT PRESENCE OF ULCERS  *URINARY PROBLEMS  *BOWEL PROBLEMS  UNUSUAL RASH Items with * indicate a potential emergency and should be followed up as soon as possible.  Feel free to call the clinic should you have any questions or concerns. The clinic phone number is [phone].  Please show the [facility] at check-in to the Emergency Department and triage nurse.  Trastuzumab injection for infusion What is this medicine? TRASTUZUMAB (tras TOO zoo mab) is a monoclonal antibody. It is used to treat breast cancer and stomach cancer. This medicine may be used for other purposes; ask your health care provider or pharmacist if you have questions. COMMON BRAND NAME(S): Herceptin, Galvin Proffer, Trazimera What should I tell my health care provider before I take this medicine? They need to know if you have any of these conditions:  heart disease  heart  failure  lung or breathing disease, like asthma  an unusual or allergic reaction to trastuzumab, benzyl alcohol, or other medications, foods, dyes, or preservatives  pregnant or trying to get pregnant  breast-feeding How should I use this medicine? This drug is given as an infusion into a vein. It is administered in a hospital or clinic by a specially trained health care professional. Talk to your pediatrician regarding the use of this medicine in children. This medicine is not approved for use in children. Overdosage: If you think you have taken too much of this medicine contact a poison control center or emergency room at once. NOTE: This medicine is only for you. Do not share this medicine with others. What if I miss a dose? It is important not to miss a dose. Call your doctor or health care professional if you are unable to keep an appointment. What may interact with this medicine? This medicine may interact with the following medications:  certain types of chemotherapy, such as daunorubicin, doxorubicin, epirubicin, and idarubicin This list may not describe all possible interactions. Give your health care provider a list of all the medicines, herbs, non-prescription drugs, or dietary supplements you use. Also tell them if you smoke, drink alcohol, or use illegal drugs. Some items may interact with your medicine. What should I watch for while using this medicine? Visit your doctor for checks on your progress. Report any side effects. Continue your course of treatment even though you feel ill unless your doctor tells you to stop. Call your doctor or health care professional for advice if you get a fever, chills or sore throat, or other symptoms of a cold or flu. Do not treat yourself. Try to avoid being around people who are sick. You may  experience fever, chills and shaking during your first infusion. These effects are usually mild and can be treated with other medicines. Report any side  effects during the infusion to your health care professional. Fever and chills usually do not happen with later infusions. Do not become pregnant while taking this medicine or for 7 months after stopping it. Women should inform their doctor if they wish to become pregnant or think they might be pregnant. Women of child-bearing potential will need to have a negative pregnancy test before starting this medicine. There is a potential for serious side effects to an unborn child. Talk to your health care professional or pharmacist for more information. Do not breast-feed an infant while taking this medicine or for 7 months after stopping it. Women must use effective birth control with this medicine. What side effects may I notice from receiving this medicine? Side effects that you should report to your doctor or health care professional as soon as possible:  allergic reactions like skin rash, itching or hives, swelling of the face, lips, or tongue  chest pain or palpitations  cough  dizziness  feeling faint or lightheaded, falls  fever  general ill feeling or flu-like symptoms  signs of worsening heart failure like breathing problems; swelling in your legs and feet  unusually weak or tired Side effects that usually do not require medical attention (report to your doctor or health care professional if they continue or are bothersome):  bone pain  changes in taste  diarrhea  joint pain  nausea/vomiting  weight loss This list may not describe all possible side effects. Call your doctor for medical advice about side effects. You may report side effects to FDA at 1-800-FDA-1088. Where should I keep my medicine? This drug is given in a hospital or clinic and will not be stored at home. NOTE: This sheet is a summary. It may not cover all possible information. If you have questions about this medicine, talk to your doctor, pharmacist, or health care provider.  2020 Elsevier/Gold Standard  (2016-11-30 14:37:52)  Paclitaxel injection What is this medicine? PACLITAXEL (PAK li TAX el) is a chemotherapy drug. It targets fast dividing cells, like cancer cells, and causes these cells to die. This medicine is used to treat ovarian cancer, breast cancer, lung cancer, Kaposi's sarcoma, and other cancers. This medicine may be used for other purposes; ask your health care provider or pharmacist if you have questions. COMMON BRAND NAME(S): Onxol, Taxol What should I tell my health care provider before I take this medicine? They need to know if you have any of these conditions:  history of irregular heartbeat  liver disease  low blood counts, like low white cell, platelet, or red cell counts  lung or breathing disease, like asthma  tingling of the fingers or toes, or other nerve disorder  an unusual or allergic reaction to paclitaxel, alcohol, polyoxyethylated castor oil, other chemotherapy, other medicines, foods, dyes, or preservatives  pregnant or trying to get pregnant  breast-feeding How should I use this medicine? This drug is given as an infusion into a vein. It is administered in a hospital or clinic by a specially trained health care professional. Talk to your pediatrician regarding the use of this medicine in children. Special care may be needed. Overdosage: If you think you have taken too much of this medicine contact a poison control center or emergency room at once. NOTE: This medicine is only for you. Do not share this medicine with others. What  if I miss a dose? It is important not to miss your dose. Call your doctor or health care professional if you are unable to keep an appointment. What may interact with this medicine? Do not take this medicine with any of the following medications:  disulfiram  metronidazole This medicine may also interact with the following medications:  antiviral medicines for hepatitis, HIV or AIDS  certain antibiotics like  erythromycin and clarithromycin  certain medicines for fungal infections like ketoconazole and itraconazole  certain medicines for seizures like carbamazepine, phenobarbital, phenytoin  gemfibrozil  nefazodone  rifampin  St. John's wort This list may not describe all possible interactions. Give your health care provider a list of all the medicines, herbs, non-prescription drugs, or dietary supplements you use. Also tell them if you smoke, drink alcohol, or use illegal drugs. Some items may interact with your medicine. What should I watch for while using this medicine? Your condition will be monitored carefully while you are receiving this medicine. You will need important blood work done while you are taking this medicine. This medicine can cause serious allergic reactions. To reduce your risk you will need to take other medicine(s) before treatment with this medicine. If you experience allergic reactions like skin rash, itching or hives, swelling of the face, lips, or tongue, tell your doctor or health care professional right away. In some cases, you may be given additional medicines to help with side effects. Follow all directions for their use. This drug may make you feel generally unwell. This is not uncommon, as chemotherapy can affect healthy cells as well as cancer cells. Report any side effects. Continue your course of treatment even though you feel ill unless your doctor tells you to stop. Call your doctor or health care professional for advice if you get a fever, chills or sore throat, or other symptoms of a cold or flu. Do not treat yourself. This drug decreases your body's ability to fight infections. Try to avoid being around people who are sick. This medicine may increase your risk to bruise or bleed. Call your doctor or health care professional if you notice any unusual bleeding. Be careful brushing and flossing your teeth or using a toothpick because you may get an infection or  bleed more easily. If you have any dental work done, tell your dentist you are receiving this medicine. Avoid taking products that contain aspirin, acetaminophen, ibuprofen, naproxen, or ketoprofen unless instructed by your doctor. These medicines may hide a fever. Do not become pregnant while taking this medicine. Women should inform their doctor if they wish to become pregnant or think they might be pregnant. There is a potential for serious side effects to an unborn child. Talk to your health care professional or pharmacist for more information. Do not breast-feed an infant while taking this medicine. Men are advised not to father a child while receiving this medicine. This product may contain alcohol. Ask your pharmacist or healthcare provider if this medicine contains alcohol. Be sure to tell all healthcare providers you are taking this medicine. Certain medicines, like metronidazole and disulfiram, can cause an unpleasant reaction when taken with alcohol. The reaction includes flushing, headache, nausea, vomiting, sweating, and increased thirst. The reaction can last from 30 minutes to several hours. What side effects may I notice from receiving this medicine? Side effects that you should report to your doctor or health care professional as soon as possible:  allergic reactions like skin rash, itching or hives, swelling of the face,  lips, or tongue  breathing problems  changes in vision  fast, irregular heartbeat  high or low blood pressure  mouth sores  pain, tingling, numbness in the hands or feet  signs of decreased platelets or bleeding - bruising, pinpoint red spots on the skin, black, tarry stools, blood in the urine  signs of decreased red blood cells - unusually weak or tired, feeling faint or lightheaded, falls  signs of infection - fever or chills, cough, sore throat, pain or difficulty passing urine  signs and symptoms of liver injury like dark yellow or brown urine;  general ill feeling or flu-like symptoms; light-colored stools; loss of appetite; nausea; right upper belly pain; unusually weak or tired; yellowing of the eyes or skin  swelling of the ankles, feet, hands  unusually slow heartbeat Side effects that usually do not require medical attention (report to your doctor or health care professional if they continue or are bothersome):  diarrhea  hair loss  loss of appetite  muscle or joint pain  nausea, vomiting  pain, redness, or irritation at site where injected  tiredness This list may not describe all possible side effects. Call your doctor for medical advice about side effects. You may report side effects to FDA at 1-800-FDA-1088. Where should I keep my medicine? This drug is given in a hospital or clinic and will not be stored at home. NOTE: This sheet is a summary. It may not cover all possible information. If you have questions about this medicine, talk to your doctor, pharmacist, or health care provider.  2020 Elsevier/Gold Standard (2017-08-09 13:14:55)  Coronavirus (COVID-19) Are you at risk?  Are you at risk for the Coronavirus (COVID-19)?  To be considered HIGH RISK for Coronavirus (COVID-19), you have to meet the following criteria:  . Traveled to [place], [place], [place], [place] or [place]; or in the [place] to [place], [place], [place], or [place]; and have fever, cough, and shortness of breath within the last 2 weeks of travel OR . Been in close contact with a person diagnosed with COVID-19 within the last 2 weeks and have fever, cough, and shortness of breath . IF YOU DO NOT MEET THESE CRITERIA, YOU ARE CONSIDERED LOW RISK FOR COVID-19.  What to do if you are HIGH RISK for COVID-19?  Marland Kitchen If you are having a medical emergency, call 911. . Seek medical care right away. Before you go to a doctor's office, urgent care or emergency department, call ahead and tell them about your recent travel, contact with  someone diagnosed with COVID-19, and your symptoms. You should receive instructions from your physician's office regarding next steps of care.  . When you arrive at healthcare provider, tell the healthcare staff immediately you have returned from visiting [place], [place], [place], [place] or [place]; or traveled in the [place] to [place], [place], [place], or [place]; in the last two weeks or you have been in close contact with a person diagnosed with COVID-19 in the last 2 weeks.   . Tell the health care staff about your symptoms: fever, cough and shortness of breath. . After you have been seen by a medical provider, you will be either: o Tested for (COVID-19) and discharged home on quarantine except to seek medical care if symptoms worsen, and asked to  - Stay home and avoid contact with others until you get your results (4-5 days)  - Avoid travel on public transportation if possible (such as bus, train,  or airplane) or o Sent to the Emergency Department by EMS for evaluation, COVID-19 testing, and possible admission depending on your condition and test results.  What to do if you are LOW RISK for COVID-19?  Reduce your risk of any infection by using the same precautions used for avoiding the common cold or flu:  Marland Kitchen Wash your hands often with soap and warm water for at least 20 seconds.  If soap and water are not readily available, use an alcohol-based hand sanitizer with at least 60% alcohol.  . If coughing or sneezing, cover your mouth and nose by coughing or sneezing into the elbow areas of your shirt or coat, into a tissue or into your sleeve (not your hands). . Avoid shaking hands with others and consider head nods or verbal greetings only. . Avoid touching your eyes, nose, or mouth with unwashed hands.  . Avoid close contact with people who are sick. . Avoid places or events with large numbers of people in one location, like concerts or sporting events. . Carefully consider  travel plans you have or are making. . If you are planning any travel outside or inside the [place], visit the CDC's Travelers' Health webpage for the latest health notices. . If you have some symptoms but not all symptoms, continue to monitor at home and seek medical attention if your symptoms worsen. . If you are having a medical emergency, call 911.   [place] / e-Visit: [email or web address]         [facility]: [facility]: [id number]                   [facility]: [phone]

## 2020-02-19 NOTE — Progress Notes (Signed)
The patient was seen in the flush room today.  She has recently had a right chest wall Port-A-Cath placed which appears to have flipped and is currently on its side.  Dr. Erroll Luna was the surgeon that placed her port.  His office was contacted.  They will reach out to the patient to evaluate her port.  Her chemotherapy was given peripherally today.  Sandi Mealy, MHS, PA-C Physician Assistant

## 2020-02-19 NOTE — Progress Notes (Signed)
Patient port was implanted on 01/22/2020. Today she was here for treatment. Her port appeared to be on its side versus flat. I had Sandi Mealy, PA  Take a look and he agreed that we wouldn't use the port today and that the patient needed to have the surgeon look at it. I drew her blood peripherally from [facility] and notified Roney Mans, RN of the issue.

## 2020-02-20 ENCOUNTER — Telehealth: Payer: Self-pay | Admitting: *Deleted

## 2020-02-22 ENCOUNTER — Ambulatory Visit: Payer: Self-pay | Admitting: Surgery

## 2020-02-22 NOTE — H&P (Signed)
Lynwood Dawley Documented: 02/22/2020 8:39 AM Location: [facility] Patient #: [id number] DOB: 1951-05-22 Married / Language: English / Race: White Female  History of Present Illness Marcello Moores A. Chimere Klingensmith MD; 02/22/2020 9:59 AM) Patient words: Patient returns for follow-up after port placement. Her port had flipped over and was unable to be accessed last week during treatment. Her first treatment was given peripherally.  The patient is a 69 year old female.   Medication History (Armen Ferguson, CMA; 02/22/2020 8:40 AM) Vitamin D (Ergocalciferol) (1.25 MG(50000 UT) Capsule, Oral) Active. Amitriptyline HCl (25MG  Tablet, Oral) Active. Norvasc (5MG  Tablet, Oral) Active. Butrans (20MCG/HR Patch Weekly, Transdermal) Active. BuSpar (10MG  Tablet, Oral) Active. Cholecalciferol (25 MCG(1000 UT) Tablet, Oral) Active. PROzac (40MG  Capsule, Oral) Active. hydrOXYzine HCl (50MG  Tablet, Oral) Active. Levothyroxine Sodium (137MCG Tablet, Oral) Active. Medications Reconciled    Vitals (Armen Ferguson CMA; 02/22/2020 8:40 AM) 02/22/2020 8:39 AM Weight: 246.25 lb Height: 67.5in Body Surface Area: 2.22 m Body Mass Index: 38 kg/m  Temp.: 97.25F  Pulse: 86 (Regular)  P.OX: 97% (Room air) BP: 148/82 (Sitting, Left Arm, Standard)        Physical Exam (Isabell Bonafede A. Marcelino Campos MD; 02/22/2020 9:59 AM)  Chest and Lung Exam Note: Right-sided port identified. He does not feel flipped today but may be flipping intermittently. No signs of infection.    Assessment & Plan (Ercie Eliasen A. Nakyia Dau MD; 02/22/2020 10:00 AM)  PORT-A-CATH IN PLACE [id number]) Impression: port has flipped over or is flipping over during treatment. plan to revise next week risk of bleeding infection more surgery discussed  Current Plans Pt Education - [employer] Free Text Education/Instructions: discussed with patient and provided information.

## 2020-02-22 NOTE — H&P (View-Only) (Signed)
[name] Documented: 02/22/2020 8:39 AM Location: [facility] Patient #: [id number] DOB: 1951/05/26 Married / Language: English / Race: White Female  History of Present Illness [name] A. Denora Wysocki MD; 02/22/2020 9:59 AM) Patient words: Patient returns for follow-up after port placement. Her port had flipped over and was unable to be accessed last week during treatment. Her first treatment was given peripherally.  The patient is a 69 year old female.   Medication History (Armen Ferguson, CMA; 02/22/2020 8:40 AM) Vitamin D (Ergocalciferol) (1.25 MG(50000 UT) Capsule, Oral) Active. Amitriptyline HCl (25MG  Tablet, Oral) Active. Norvasc (5MG  Tablet, Oral) Active. Butrans (20MCG/HR Patch Weekly, Transdermal) Active. BuSpar (10MG  Tablet, Oral) Active. Cholecalciferol (25 MCG(1000 UT) Tablet, Oral) Active. PROzac (40MG  Capsule, Oral) Active. hydrOXYzine HCl (50MG  Tablet, Oral) Active. Levothyroxine Sodium (137MCG Tablet, Oral) Active. Medications Reconciled    Vitals (Armen Ferguson CMA; 02/22/2020 8:40 AM) 02/22/2020 8:39 AM Weight: 246.25 lb Height: 67.5in Body Surface Area: 2.22 m Body Mass Index: 38 kg/m  Temp.: 97.74F  Pulse: 86 (Regular)  P.OX: 97% (Room air) BP: 148/82 (Sitting, Left Arm, Standard)        Physical Exam (Vilma Will A. Ngina Royer MD; 02/22/2020 9:59 AM)  Chest and Lung Exam Note: Right-sided port identified. He does not feel flipped today but may be flipping intermittently. No signs of infection.    Assessment & Plan (Levent Kornegay A. Denessa Cavan MD; 02/22/2020 10:00 AM)  PORT-A-CATH IN PLACE [id number]) Impression: port has flipped over or is flipping over during treatment. plan to revise next week risk of bleeding infection more surgery discussed  Current Plans Pt Education - [employer] Free Text Education/Instructions: discussed with patient and provided information.

## 2020-02-25 ENCOUNTER — Ambulatory Visit: Payer: Medicare PPO | Admitting: Physical Therapy

## 2020-02-25 ENCOUNTER — Other Ambulatory Visit (HOSPITAL_COMMUNITY): Payer: Medicare PPO

## 2020-02-25 NOTE — Progress Notes (Signed)
Patient Care Team: Maurice Small, MD as PCP - General (Family Medicine) Rockwell Germany, RN as Oncology Nurse Navigator Tressie Ellis, Paulette Blanch, RN as Oncology Nurse Navigator Erroll Luna, MD as Consulting Physician (General Surgery) Nicholas Lose, MD as Consulting Physician (Hematology and Oncology) Gery Pray, MD as Consulting Physician (Radiation Oncology)  DIAGNOSIS:    ICD-10-CM   1. Malignant neoplasm of upper-inner quadrant of right breast in female, estrogen receptor positive ([place])  C50.211    Z17.0     SUMMARY OF ONCOLOGIC HISTORY: Oncology History  Malignant neoplasm of upper-inner quadrant of right breast in female, estrogen receptor positive ([place])  12/26/2019 Initial Diagnosis   Patient palpated a right breast lump x1wk. Mammogram and US showed two adjacent masses at the 2 o'clock position measuring 1.4cm and 0.6cm, calcifications in the outer right breast at the 9 o'clock position, no right axillary adenopathy. Biopsy showed IDC at the 2 o'clock position, grade 3, HER-2 + (3+), ER+ 90%, PR+ 30%, Ki67 30%, and DCIS in the upper outer right breast, high grade, ER+ 95%, PR 90%.   01/22/2020 Surgery   Right breast lumpectomy x2 ([facility]): Medial position: IDC, grade 3, 2.3cm, with high grade DCIS, clear margins Lateral position: high grade DCIS, clear margins, 4 right axillary lymph nodes negative    01/22/2020 Cancer Staging   Staging form: Breast, AJCC 8th Edition - Pathologic stage from 01/22/2020: Stage IA (pT2, pN0, cM0, G3, ER+, PR+, HER2+) - Signed by Gardenia Phlegm, NP on 02/06/2020   02/19/2020 -  Chemotherapy   The patient had PACLitaxel (TAXOL) 180 mg in sodium chloride 0.9 % 250 mL chemo infusion (</= 74m/m2), 80 mg/m2 = 180 mg, Intravenous,  Once, 1 of 3 cycles Administration: 180 mg (02/19/2020) trastuzumab-anns (KANJINTI) 441 mg in sodium chloride 0.9 % 250 mL chemo infusion, 4 mg/kg = 441 mg (100 % of original dose 4 mg/kg), Intravenous,  Once, 1 of 16  cycles Dose modification: 4 mg/kg (original dose 4 mg/kg, Cycle 1, Reason: Other (see comments)), 6 mg/kg (Cycle 4, Reason: Other (see comments), Comment: insurance preferred biosimilar), 6 mg/kg (Cycle 10, Reason: Other (see comments)) Administration: 441 mg (02/19/2020)  for chemotherapy treatment.      CHIEF COMPLIANT: Cycle 2 Taxol Herceptin  INTERVAL HISTORY: Isabel HABLEis a 69y.o. with above-mentioned history of right breast cancer who underwent a right breast lumpectomy x2 and is currently on adjuvant chemotherapy with Taxol Herceptin. She presents to the clinic todayfor a toxicity check and cycle 2.     ALLERGIES:  is allergic to aTeachers Insurance and Annuity Associationtartrate].  MEDICATIONS:  Current Outpatient Medications  Medication Sig Dispense Refill  . amitriptyline (ELAVIL) 25 MG tablet Take 75 mg by mouth at bedtime.     .Marland KitchenamLODipine (NORVASC) 5 MG tablet Take 7.5 mg by mouth at bedtime.     . Buprenorphine HCl (BELBUCA) 150 MCG FILM Place 150 mcg inside cheek 2 (two) times daily as needed (pain.).     .Marland Kitchendiazepam (VALIUM) 10 MG tablet Take 1 tablet (10 mg total) by mouth at bedtime as needed for anxiety. (Patient taking differently: Take 10 mg by mouth at bedtime as needed for sleep. ) 30 tablet 2  . FLUoxetine (PROZAC) 40 MG capsule Take 80 mg by mouth at bedtime.     .Marland Kitchenibuprofen (ADVIL,MOTRIN) 200 MG tablet Take 400-600 mg by mouth 2 (two) times daily as needed for moderate pain.     .Marland Kitchenlevothyroxine (SYNTHROID, LEVOTHROID) 137 MCG tablet Take  137 mcg by mouth at bedtime.     . lidocaine-prilocaine (EMLA) cream Apply to affected area once (Patient taking differently: Apply 1 application topically daily as needed (prior to port being accessed.). Apply to affected area once) 30 g 3  . ondansetron (ZOFRAN) 8 MG tablet Take 1 tablet (8 mg total) by mouth 2 (two) times daily as needed (Nausea or vomiting). 30 tablet 1  . prochlorperazine (COMPAZINE) 10 MG tablet Take 1 tablet (10 mg total) by  mouth every 6 (six) hours as needed (Nausea or vomiting). 30 tablet 1  . traMADol (ULTRAM) 50 MG tablet Take 1 tablet (50 mg total) by mouth every 6 (six) hours as needed. (Patient not taking: Reported on 02/22/2020) 30 tablet 0   No current facility-administered medications for this visit.    PHYSICAL EXAMINATION: ECOG PERFORMANCE STATUS: 1 - Symptomatic but completely ambulatory  There were no vitals filed for this visit. There were no vitals filed for this visit.   LABORATORY DATA:  I have reviewed the data as listed CMP Latest Ref Rng & Units 02/19/2020 01/18/2020 01/02/2020  Glucose 70 - 99 mg/dL 118(H) 108(H) 91  BUN 8 - 23 mg/dL '17 23 22  ' Creatinine 0.44 - 1.00 mg/dL 0.97 0.73 0.83  Sodium 135 - 145 mmol/L 142 141 143  Potassium 3.5 - 5.1 mmol/L 4.4 4.1 4.1  Chloride 98 - 111 mmol/L 105 107 106  CO2 22 - 32 mmol/L '28 25 26  ' Calcium 8.9 - 10.3 mg/dL 8.8(L) 8.7(L) 8.6(L)  Total Protein 6.5 - 8.1 g/dL 6.3(L) 6.1(L) 7.0  Total Bilirubin 0.3 - 1.2 mg/dL 0.3 0.4 0.3  Alkaline Phos 38 - 126 U/L 96 74 94  AST 15 - 41 U/L 17 21 14(L)  ALT 0 - 44 U/L '17 21 16    ' Lab Results  Component Value Date   WBC 6.4 02/26/2020   HGB 12.4 02/26/2020   HCT 38.8 02/26/2020   MCV 97.5 02/26/2020   PLT 289 02/26/2020   NEUTROABS 4.5 02/26/2020    ASSESSMENT & PLAN:  Malignant neoplasm of upper-inner quadrant of right breast in female, estrogen receptor positive ([place]) 01/22/2020: Right medial lumpectomy: Grade 3 IDC 2.3 cm with high-grade DCIS with necrosis, margins negative, negative for lymphovascular or perineural invasion, Right lateral lumpectomy: Isolated foci of DCIS high-grade, resection margins negative 4 lymph nodes negative, ER 90%, PR 30%, HER-2 3+ positive, Ki-67 30%  Treatment plan: 1.Adjuvant chemotherapy with Taxol Herceptin weekly x12 followed by Herceptin maintenance 2.Adjuvant radiation therapy 3.Followed by adjuvant antiestrogen  therapy. ----------------------------------------------------------------------------------------------------------------------------------------------------------- Current treatment: Cycle 2 Taxol Herceptin Echocardiogram 02/04/2020: EF 60 to 65%  Chemo toxicities: Denies any nausea or vomiting.  Denies any diarrhea. Denies fatigue.  Her port will be corrected on Thursday. Extensive bruising from before. Labs look excellent today. Return to clinic weekly for chemo and every other week for follow-up with me.    No orders of the defined types were placed in this encounter.  The patient has a good understanding of the overall plan. she agrees with it. she will call with any problems that may develop before the next visit here.  Total time spent: 30 mins including face to face time and time spent for planning, charting and coordination of care  Nicholas Lose, MD 02/26/2020  I, Cloyde Reams Dorshimer, am acting as scribe for Dr. Nicholas Lose.  I have reviewed the above documentation for accuracy and completeness, and I agree with the above.

## 2020-02-26 ENCOUNTER — Other Ambulatory Visit: Payer: Self-pay

## 2020-02-26 ENCOUNTER — Encounter: Payer: Self-pay | Admitting: *Deleted

## 2020-02-26 ENCOUNTER — Inpatient Hospital Stay: Payer: Medicare PPO | Admitting: Hematology and Oncology

## 2020-02-26 ENCOUNTER — Inpatient Hospital Stay: Payer: Medicare PPO

## 2020-02-26 ENCOUNTER — Other Ambulatory Visit: Payer: Self-pay | Admitting: Hematology and Oncology

## 2020-02-26 ENCOUNTER — Other Ambulatory Visit (HOSPITAL_COMMUNITY)
Admission: RE | Admit: 2020-02-26 | Discharge: 2020-02-26 | Disposition: A | Discharge status: Home / Self Care | Payer: Medicare PPO | Source: Ambulatory Visit | Attending: Surgery | Admitting: Surgery

## 2020-02-26 DIAGNOSIS — Z5112 Encounter for antineoplastic immunotherapy: Secondary | ICD-10-CM | POA: Diagnosis not present

## 2020-02-26 DIAGNOSIS — C50211 Malignant neoplasm of upper-inner quadrant of right female breast: Secondary | ICD-10-CM | POA: Diagnosis not present

## 2020-02-26 DIAGNOSIS — Z20822 Contact with and (suspected) exposure to covid-19: Secondary | ICD-10-CM | POA: Diagnosis not present

## 2020-02-26 DIAGNOSIS — Z170 Estrogen receptor positive status [ER+]: Secondary | ICD-10-CM | POA: Diagnosis not present

## 2020-02-26 DIAGNOSIS — Z01812 Encounter for preprocedural laboratory examination: Secondary | ICD-10-CM | POA: Diagnosis present

## 2020-02-26 LAB — SARS CORONAVIRUS 2 (TAT 6-24 HRS): SARS Coronavirus 2: NEGATIVE

## 2020-02-26 LAB — CMP (CANCER CENTER ONLY)
ALT: 33 U/L (ref 0–44)
AST: 26 U/L (ref 15–41)
Albumin: 3.4 g/dL — ABNORMAL LOW (ref 3.5–5.0)
Alkaline Phosphatase: 88 U/L (ref 38–126)
Anion gap: 7 (ref 5–15)
BUN: 22 mg/dL (ref 8–23)
CO2: 26 mmol/L (ref 22–32)
Calcium: 8.4 mg/dL — ABNORMAL LOW (ref 8.9–10.3)
Chloride: 105 mmol/L (ref 98–111)
Creatinine: 0.9 mg/dL (ref 0.44–1.00)
GFR, Est AFR Am: >60 mL/min (ref >60)
GFR, Estimated: >60 mL/min (ref >60)
Glucose, Bld: 131 mg/dL — ABNORMAL HIGH (ref 70–99)
Potassium: 4.2 mmol/L (ref 3.5–5.1)
Sodium: 138 mmol/L (ref 135–145)
Total Bilirubin: 0.5 mg/dL (ref 0.3–1.2)
Total Protein: 6.4 g/dL — ABNORMAL LOW (ref 6.5–8.1)

## 2020-02-26 LAB — CBC WITH DIFFERENTIAL (CANCER CENTER ONLY)
Abs Immature Granulocytes: 0.05 10*3/uL (ref 0.00–0.07)
Basophils Absolute: 0 10*3/uL (ref 0.0–0.1)
Basophils Relative: 0 %
Eosinophils Absolute: 0.2 10*3/uL (ref 0.0–0.5)
Eosinophils Relative: 3 %
HCT: 38.8 % (ref 36.0–46.0)
Hemoglobin: 12.4 g/dL (ref 12.0–15.0)
Immature Granulocytes: 1 %
Lymphocytes Relative: 22 %
Lymphs Abs: 1.4 10*3/uL (ref 0.7–4.0)
MCH: 31.2 pg (ref 26.0–34.0)
MCHC: 32 g/dL (ref 30.0–36.0)
MCV: 97.5 fL (ref 80.0–100.0)
Monocytes Absolute: 0.3 10*3/uL (ref 0.1–1.0)
Monocytes Relative: 4 %
Neutro Abs: 4.5 10*3/uL (ref 1.7–7.7)
Neutrophils Relative %: 70 %
Platelet Count: 289 10*3/uL (ref 150–400)
RBC: 3.98 MIL/uL (ref 3.87–5.11)
RDW: 12.8 % (ref 11.5–15.5)
WBC Count: 6.4 10*3/uL (ref 4.0–10.5)
nRBC: 0 % (ref 0.0–0.2)

## 2020-02-26 MED ORDER — FAMOTIDINE IN NACL 20-0.9 MG/50ML-% IV SOLN
20.0000 mg | Freq: Once | INTRAVENOUS | Status: AC
  Administered 2020-02-26: 20 mg via INTRAVENOUS

## 2020-02-26 MED ORDER — DIPHENHYDRAMINE HCL 50 MG/ML IJ SOLN
25.0000 mg | Freq: Once | INTRAMUSCULAR | Status: AC
  Administered 2020-02-26: 25 mg via INTRAVENOUS

## 2020-02-26 MED ORDER — ACETAMINOPHEN 325 MG PO TABS
650.0000 mg | ORAL_TABLET | Freq: Once | ORAL | Status: AC
  Administered 2020-02-26: 650 mg via ORAL

## 2020-02-26 MED ORDER — FAMOTIDINE IN NACL 20-0.9 MG/50ML-% IV SOLN
INTRAVENOUS | Status: AC
  Filled 2020-02-26: qty 50

## 2020-02-26 MED ORDER — ACETAMINOPHEN 325 MG PO TABS
ORAL_TABLET | ORAL | Status: AC
  Filled 2020-02-26: qty 2

## 2020-02-26 MED ORDER — DIPHENHYDRAMINE HCL 50 MG/ML IJ SOLN
INTRAMUSCULAR | Status: AC
  Filled 2020-02-26: qty 1

## 2020-02-26 MED ORDER — DEXAMETHASONE SODIUM PHOSPHATE 10 MG/ML IJ SOLN
INTRAMUSCULAR | Status: AC
  Filled 2020-02-26: qty 1

## 2020-02-26 MED ORDER — SODIUM CHLORIDE 0.9 % IV SOLN
Freq: Once | INTRAVENOUS | Status: AC
  Filled 2020-02-26: qty 250

## 2020-02-26 MED ORDER — SODIUM CHLORIDE 0.9 % IV SOLN: 10.0000 mg | Freq: Once | INTRAVENOUS | Status: DC

## 2020-02-26 MED ORDER — TRASTUZUMAB-ANNS CHEMO 150 MG IV SOLR
2.0000 mg/kg | Freq: Once | INTRAVENOUS | Status: AC
  Administered 2020-02-26: 14:00:00 210 mg via INTRAVENOUS
  Filled 2020-02-26: qty 10

## 2020-02-26 MED ORDER — DEXAMETHASONE SODIUM PHOSPHATE 10 MG/ML IJ SOLN
10.0000 mg | Freq: Once | INTRAMUSCULAR | Status: AC
  Administered 2020-02-26: 10 mg via INTRAVENOUS

## 2020-02-26 MED ORDER — SODIUM CHLORIDE 0.9 % IV SOLN
80.0000 mg/m2 | Freq: Once | INTRAVENOUS | Status: AC
  Administered 2020-02-26: 180 mg via INTRAVENOUS
  Filled 2020-02-26: qty 30

## 2020-02-26 NOTE — Patient Instructions (Signed)
[facility] Discharge Instructions for Patients Receiving Chemotherapy  Today you received the following chemotherapy agents: trastuzumab and paclitaxel.  To help prevent nausea and vomiting after your treatment, we encourage you to take your nausea medication as directed.   If you develop nausea and vomiting that is not controlled by your nausea medication, call the clinic.   BELOW ARE SYMPTOMS THAT SHOULD BE REPORTED IMMEDIATELY:  *FEVER GREATER THAN 100.5 F  *CHILLS WITH OR WITHOUT FEVER  NAUSEA AND VOMITING THAT IS NOT CONTROLLED WITH YOUR NAUSEA MEDICATION  *UNUSUAL SHORTNESS OF BREATH  *UNUSUAL BRUISING OR BLEEDING  TENDERNESS IN MOUTH AND THROAT WITH OR WITHOUT PRESENCE OF ULCERS  *URINARY PROBLEMS  *BOWEL PROBLEMS  UNUSUAL RASH Items with * indicate a potential emergency and should be followed up as soon as possible.  Feel free to call the clinic should you have any questions or concerns. The clinic phone number is [phone].  Please show the CHEMO ALERT CARD at check-in to the Emergency Department and triage nurse.   

## 2020-02-26 NOTE — Assessment & Plan Note (Signed)
01/22/2020: Right medial lumpectomy: Grade 3 IDC 2.3 cm with high-grade DCIS with necrosis, margins negative, negative for lymphovascular or perineural invasion, Right lateral lumpectomy: Isolated foci of DCIS high-grade, resection margins negative 4 lymph nodes negative, ER 90%, PR 30%, HER-2 3+ positive, Ki-67 30%  Treatment plan: 1.Adjuvant chemotherapy with Taxol Herceptin weekly x12 followed by Herceptin maintenance 2.Adjuvant radiation therapy 3.Followed by adjuvant antiestrogen therapy. ----------------------------------------------------------------------------------------------------------------------------------------------------------- Current treatment: Cycle 2 Taxol Herceptin Echocardiogram 02/04/2020: EF 60 to 65%  Chemo toxicities:  Return to clinic weekly for chemo and every other week for follow-up with me.

## 2020-02-27 ENCOUNTER — Other Ambulatory Visit: Payer: Self-pay

## 2020-02-27 ENCOUNTER — Encounter (HOSPITAL_COMMUNITY): Payer: Self-pay | Admitting: Surgery

## 2020-02-27 NOTE — Anesthesia Preprocedure Evaluation (Addendum)
Anesthesia Evaluation  Patient identified by MRN, date of birth, ID band Patient awake    Reviewed: Allergy & Precautions, NPO status , Patient's Chart, lab work & pertinent test results  Airway Mallampati: II  TM Distance: >3 FB Neck ROM: Full    Dental no notable dental hx. (+) Teeth Intact, Dental Advisory Given   Pulmonary former smoker,    Pulmonary exam normal breath sounds clear to auscultation       Cardiovascular hypertension, Pt. on medications Normal cardiovascular exam Rhythm:Regular Rate:Normal     Neuro/Psych Anxiety    GI/Hepatic negative GI ROS, Neg liver ROS,   Endo/Other  Hypothyroidism   Renal/GU Cr 0.90     Musculoskeletal negative musculoskeletal ROS (+) Spinal fusion   Abdominal (+) + obese,   Peds  Hematology negative hematology ROS (+)   Anesthesia Other Findings Breast CA  Reproductive/Obstetrics                            Anesthesia Physical Anesthesia Plan  ASA: III  Anesthesia Plan: MAC   Post-op Pain Management:    Induction: Intravenous  PONV Risk Score and Plan: Treatment may vary due to age or medical condition  Airway Management Planned: Nasal Cannula and Natural Airway  Additional Equipment: None  Intra-op Plan:   Post-operative Plan:   Informed Consent: I have reviewed the patients History and Physical, chart, labs and discussed the procedure including the risks, benefits and alternatives for the proposed anesthesia with the patient or authorized representative who has indicated his/her understanding and acceptance.     Dental advisory given  Plan Discussed with: CRNA  Anesthesia Plan Comments: (PAT note written 02/27/2020 by [name], PA-C. On chemotherapy (Taxol, Herceptin).  )      Anesthesia Quick Evaluation

## 2020-02-27 NOTE — Progress Notes (Signed)
Anesthesia Chart Review: [name]   Case: [id number] Date/Time: 02/28/20 0715   Procedure: PORT A CATH REVISION (N/A )   Anesthesia type: Monitor Anesthesia Care   Pre-op diagnosis: PORT MALFUNCTION   Location: [facility] OR ROOM 01 / [place] OR   Surgeons: Erroll Luna, MD      DISCUSSION: Patient is a 69 year old female scheduled for the above procedure.   History includes former smoker, right breast cancer (s/p right breast lumpectomy x2, Port-a-cath 01/22/20), HTN, hypothyroidism, fall/concussion (with small left SAH and small left SDH, 02/2007), anxiety, depression, chronic pain, spinal fusion (L4-5 PLIF 03/04/11).   She is on adjuvant chemotherapy with Taxol and Herceptin. By notes, her Port-a-cath flipped, so could not be accessed so chemotherapy has been given peripherally. S/p cycle 2 on 02/26/20.  02/26/20 presurgical COVID-19 test negative. Labs from 02/26/20 reviewed. Anesthesia team to evaluate on the day of surgery.    VS: As of 02/26/20, BP 156/84, HR 96.     PROVIDERS: Maurice Small, MD is PCP  Nicholas Lose, MD is Edson Snowball, MD is RAD-ONC   LABS:  Lab Results  Component Value Date   WBC 6.4 02/26/2020   HGB 12.4 02/26/2020   HCT 38.8 02/26/2020   PLT 289 02/26/2020   GLUCOSE 131 (H) 02/26/2020   ALT 33 02/26/2020   AST 26 02/26/2020   NA 138 02/26/2020   K 4.2 02/26/2020   CL 105 02/26/2020   CREATININE 0.90 02/26/2020   BUN 22 02/26/2020   CO2 26 02/26/2020      IMAGES: 1V CXR 01/22/20: FINDINGS: - Port-A-Cath with the tip projecting over the SVC. - There is mild bilateral interstitial thickening. There is no focal consolidation. There is no pleural effusion or pneumothorax. The heart and mediastinal contours are unremarkable. - There is no acute osseous abnormality. IMPRESSION: Port-A-Cath with the tip projecting over the SVC.   EKG: 01/18/20: NSR   CV: Echo 02/04/20: IMPRESSIONS  1. Normal GLS -16.9. Left ventricular ejection fraction, by  estimation,  is 60 to 65%. The left ventricle has normal function. The left ventricle  has no regional wall motion abnormalities. Left ventricular diastolic  parameters were normal.  2. Right ventricular systolic function is normal. The right ventricular  size is normal. There is mildly elevated pulmonary artery systolic  pressure.  3. The mitral valve is normal in structure and function. Trivial mitral  valve regurgitation. No evidence of mitral stenosis.  4. The aortic valve is tricuspid. Aortic valve regurgitation is trivial.  Mild aortic valve sclerosis is present, with no evidence of aortic valve  stenosis.  5. The inferior vena cava is normal in size with greater than 50%  respiratory variability, suggesting right atrial pressure of 3 mmHg.    Past Medical History:  Diagnosis Date  . Anxiety   . Chronic pain   . Concussion 3/08   ICU x 3 days  . Depression   . Hypertension   . Hypothyroidism   . Spinal stenosis   . Thyroid disease ?1994    Past Surgical History:  Procedure Laterality Date  . BREAST LUMPECTOMY WITH RADIOACTIVE SEED AND SENTINEL LYMPH NODE BIOPSY Right 01/22/2020   Procedure: RIGHT BREAST LUMPECTOMY WITH RADIOACTIVE SEED X2 AND RIGHT SENTINEL LYMPH NODE MAPPING;  Surgeon: Erroll Luna, MD;  Location: [facility];  Service: General;  Laterality: Right;  . BREAST SURGERY Right 4/99   breast biopsy, benign  . PORTACATH PLACEMENT Right 01/22/2020   Procedure: INSERTION PORT-A-CATH  WITH ULTRASOUND;  Surgeon: Erroll Luna, MD;  Location: [facility];  Service: General;  Laterality: Right;  . SPINAL FUSION  03/04/11   with ORIF    MEDICATIONS: No current facility-administered medications for this encounter.   Marland Kitchen amitriptyline (ELAVIL) 25 MG tablet  . amLODipine (NORVASC) 5 MG tablet  . Buprenorphine HCl (BELBUCA) 150 MCG FILM  . diazepam (VALIUM) 10 MG tablet  . FLUoxetine (PROZAC) 40 MG capsule  . ibuprofen (ADVIL,MOTRIN)  200 MG tablet  . levothyroxine (SYNTHROID, LEVOTHROID) 137 MCG tablet  . lidocaine-prilocaine (EMLA) cream  . ondansetron (ZOFRAN) 8 MG tablet  . prochlorperazine (COMPAZINE) 10 MG tablet  . traMADol (ULTRAM) 50 MG tablet     [name], PA-C Surgical Short Stay/Anesthesiology [facility] Phone [phone] [facility] Phone [phone] 02/27/2020 1:32 PM

## 2020-02-27 NOTE — Progress Notes (Addendum)
Patient states no SOB, Fever, Cough or Chest Pain.  PCP - DR Maurice Small Cardiologist - denies Oncology - Dr Nicholas Lose  Chest x-ray - 01/22/20, 1 View EKG - 01/18/20 Stress Test - denies ECHO - 02/04/20 Cardiac Cath - denies  Anesthesia review: Yes  STOP now taking any Aspirin (unless otherwise instructed by your surgeon), Aleve, Naproxen, Ibuprofen, Motrin, Advil, Goody's, BC's, all herbal medications, fish oil, and all vitamins.   Coronavirus Screening Covid test on 02/26/20 was negative.   Patient verbalizes understanding of the instructions that were given via phone.

## 2020-02-28 ENCOUNTER — Other Ambulatory Visit: Payer: Self-pay

## 2020-02-28 ENCOUNTER — Encounter (HOSPITAL_COMMUNITY): Payer: Self-pay | Admitting: Surgery

## 2020-02-28 ENCOUNTER — Ambulatory Visit (HOSPITAL_COMMUNITY): Payer: Medicare PPO | Admitting: Vascular Surgery

## 2020-02-28 ENCOUNTER — Encounter (HOSPITAL_COMMUNITY)
Admission: RE | Disposition: A | Discharge status: Home / Self Care | Payer: Self-pay | Source: Home / Self Care | Attending: Surgery

## 2020-02-28 ENCOUNTER — Ambulatory Visit (HOSPITAL_COMMUNITY)
Admission: RE | Admit: 2020-02-28 | Discharge: 2020-02-28 | Disposition: A | Discharge status: Home / Self Care | Payer: Medicare PPO | Attending: Surgery | Admitting: Surgery

## 2020-02-28 ENCOUNTER — Other Ambulatory Visit: Payer: Self-pay | Admitting: Hematology and Oncology

## 2020-02-28 ENCOUNTER — Ambulatory Visit (HOSPITAL_COMMUNITY): Payer: Medicare PPO

## 2020-02-28 DIAGNOSIS — Z79890 Hormone replacement therapy (postmenopausal): Secondary | ICD-10-CM | POA: Insufficient documentation

## 2020-02-28 DIAGNOSIS — F329 Major depressive disorder, single episode, unspecified: Secondary | ICD-10-CM | POA: Diagnosis not present

## 2020-02-28 DIAGNOSIS — I10 Essential (primary) hypertension: Secondary | ICD-10-CM | POA: Diagnosis not present

## 2020-02-28 DIAGNOSIS — F419 Anxiety disorder, unspecified: Secondary | ICD-10-CM | POA: Insufficient documentation

## 2020-02-28 DIAGNOSIS — Z87891 Personal history of nicotine dependence: Secondary | ICD-10-CM | POA: Insufficient documentation

## 2020-02-28 DIAGNOSIS — X58XXXA Exposure to other specified factors, initial encounter: Secondary | ICD-10-CM | POA: Diagnosis not present

## 2020-02-28 DIAGNOSIS — T82514A Breakdown (mechanical) of infusion catheter, initial encounter: Secondary | ICD-10-CM | POA: Insufficient documentation

## 2020-02-28 DIAGNOSIS — Z79899 Other long term (current) drug therapy: Secondary | ICD-10-CM | POA: Insufficient documentation

## 2020-02-28 DIAGNOSIS — Z419 Encounter for procedure for purposes other than remedying health state, unspecified: Secondary | ICD-10-CM

## 2020-02-28 DIAGNOSIS — E039 Hypothyroidism, unspecified: Secondary | ICD-10-CM | POA: Diagnosis not present

## 2020-02-28 HISTORY — DX: Malignant (primary) neoplasm, unspecified: C80.1

## 2020-02-28 HISTORY — PX: PORTACATH PLACEMENT: SHX2246

## 2020-02-28 SURGERY — INSERTION, TUNNELED CENTRAL VENOUS DEVICE, WITH PORT
Anesthesia: Monitor Anesthesia Care | Site: Chest | Laterality: Right

## 2020-02-28 MED ORDER — SODIUM CHLORIDE 0.9 % IV SOLN
INTRAVENOUS | Status: AC
  Filled 2020-02-28: qty 1.2

## 2020-02-28 MED ORDER — CEFAZOLIN SODIUM-DEXTROSE 2-4 GM/100ML-% IV SOLN
2.0000 g | INTRAVENOUS | Status: AC
  Administered 2020-02-28: 2 g via INTRAVENOUS
  Filled 2020-02-28: qty 100

## 2020-02-28 MED ORDER — MIDAZOLAM HCL 5 MG/5ML IJ SOLN
INTRAMUSCULAR | Status: DC | PRN
  Administered 2020-02-28: 2 mg via INTRAVENOUS

## 2020-02-28 MED ORDER — SODIUM CHLORIDE 0.9 % IV SOLN
INTRAVENOUS | Status: DC | PRN
  Administered 2020-02-28: 500 mL

## 2020-02-28 MED ORDER — TRAMADOL HCL 50 MG PO TABS: 50.0000 mg | ORAL_TABLET | Freq: Four times a day (QID) | ORAL | 0 refills | Status: DC | PRN

## 2020-02-28 MED ORDER — 0.9 % SODIUM CHLORIDE (POUR BTL) OPTIME
TOPICAL | Status: DC | PRN
  Administered 2020-02-28: 1000 mL

## 2020-02-28 MED ORDER — BUPIVACAINE HCL (PF) 0.25 % IJ SOLN
INTRAMUSCULAR | Status: AC
  Filled 2020-02-28: qty 30

## 2020-02-28 MED ORDER — SODIUM CHLORIDE 0.9 % IV SOLN
INTRAVENOUS | Status: AC
  Filled 2020-02-28: qty 500000

## 2020-02-28 MED ORDER — ROCURONIUM BROMIDE 10 MG/ML (PF) SYRINGE
PREFILLED_SYRINGE | INTRAVENOUS | Status: AC
  Filled 2020-02-28: qty 10

## 2020-02-28 MED ORDER — LIDOCAINE 2% (20 MG/ML) 5 ML SYRINGE
INTRAMUSCULAR | Status: AC
  Filled 2020-02-28: qty 5

## 2020-02-28 MED ORDER — HEPARIN SOD (PORK) LOCK FLUSH 100 UNIT/ML IV SOLN
INTRAVENOUS | Status: DC | PRN
  Administered 2020-02-28: 500 [IU] via INTRAVENOUS

## 2020-02-28 MED ORDER — BUPIVACAINE HCL (PF) 0.25 % IJ SOLN
INTRAMUSCULAR | Status: DC | PRN
  Administered 2020-02-28: 15 mL

## 2020-02-28 MED ORDER — PROPOFOL 10 MG/ML IV BOLUS
INTRAVENOUS | Status: AC
  Filled 2020-02-28: qty 20

## 2020-02-28 MED ORDER — HYDROCODONE-ACETAMINOPHEN 5-325 MG PO TABS: 1.0000 | ORAL_TABLET | Freq: Four times a day (QID) | ORAL | 0 refills | Status: DC | PRN

## 2020-02-28 MED ORDER — PROPOFOL 500 MG/50ML IV EMUL
INTRAVENOUS | Status: DC | PRN
  Administered 2020-02-28: 50 ug/kg/min via INTRAVENOUS

## 2020-02-28 MED ORDER — ONDANSETRON HCL 4 MG/2ML IJ SOLN: 4.0000 mg | Freq: Once | INTRAMUSCULAR | Status: DC | PRN

## 2020-02-28 MED ORDER — MIDAZOLAM HCL 2 MG/2ML IJ SOLN
INTRAMUSCULAR | Status: AC
  Filled 2020-02-28: qty 2

## 2020-02-28 MED ORDER — LACTATED RINGERS IV SOLN: INTRAVENOUS | Status: DC | PRN

## 2020-02-28 MED ORDER — ACETAMINOPHEN 10 MG/ML IV SOLN: 1000.0000 mg | Freq: Once | INTRAVENOUS | Status: DC | PRN

## 2020-02-28 MED ORDER — DOXYCYCLINE HYCLATE 50 MG PO CAPS: 100.0000 mg | ORAL_CAPSULE | Freq: Two times a day (BID) | ORAL | 0 refills | Status: DC

## 2020-02-28 MED ORDER — HEPARIN SOD (PORK) LOCK FLUSH 100 UNIT/ML IV SOLN
INTRAVENOUS | Status: AC
  Filled 2020-02-28: qty 5

## 2020-02-28 MED ORDER — CHLORHEXIDINE GLUCONATE CLOTH 2 % EX PADS: 6.0000 | MEDICATED_PAD | Freq: Once | CUTANEOUS | Status: DC

## 2020-02-28 MED ORDER — ACETAMINOPHEN 500 MG PO TABS
1000.0000 mg | ORAL_TABLET | Freq: Once | ORAL | Status: AC
  Administered 2020-02-28: 1000 mg via ORAL
  Filled 2020-02-28: qty 2

## 2020-02-28 MED ORDER — FENTANYL CITRATE (PF) 100 MCG/2ML IJ SOLN
INTRAMUSCULAR | Status: DC | PRN
  Administered 2020-02-28 (×2): 50 ug via INTRAVENOUS

## 2020-02-28 MED ORDER — FENTANYL CITRATE (PF) 250 MCG/5ML IJ SOLN
INTRAMUSCULAR | Status: AC
  Filled 2020-02-28: qty 5

## 2020-02-28 MED ORDER — FENTANYL CITRATE (PF) 100 MCG/2ML IJ SOLN: 25.0000 ug | INTRAMUSCULAR | Status: DC | PRN

## 2020-02-28 MED ORDER — SUCCINYLCHOLINE CHLORIDE 200 MG/10ML IV SOSY
PREFILLED_SYRINGE | INTRAVENOUS | Status: AC
  Filled 2020-02-28: qty 10

## 2020-02-28 SURGICAL SUPPLY — 42 items
ADH SKN CLS APL DERMABOND .7 (GAUZE/BANDAGES/DRESSINGS) ×1
APL PRP STRL LF DISP 70% ISPRP (MISCELLANEOUS) ×1
BAG DECANTER FOR FLEXI CONT (MISCELLANEOUS) ×3 IMPLANT
CHLORAPREP W/TINT 26 (MISCELLANEOUS) ×2 IMPLANT
COVER SURGICAL LIGHT HANDLE (MISCELLANEOUS) ×2 IMPLANT
COVER TRANSDUCER ULTRASND GEL (DRAPE) ×2 IMPLANT
COVER WAND RF STERILE (DRAPES) ×1 IMPLANT
DERMABOND ADVANCED (GAUZE/BANDAGES/DRESSINGS) ×1
DERMABOND ADVANCED .7 DNX12 (GAUZE/BANDAGES/DRESSINGS) ×1 IMPLANT
DRAPE C-ARM 42X120 X-RAY (DRAPES) ×2 IMPLANT
ELECT CAUTERY BLADE 6.4 (BLADE) ×2 IMPLANT
ELECT REM PT RETURN 9FT ADLT (ELECTROSURGICAL) ×2
ELECTRODE REM PT RTRN 9FT ADLT (ELECTROSURGICAL) ×1 IMPLANT
GAUZE 4X4 16PLY RFD (DISPOSABLE) ×1 IMPLANT
GEL ULTRASOUND 20GR AQUASONIC (MISCELLANEOUS) ×1 IMPLANT
GLOVE BIO SURGEON STRL SZ8 (GLOVE) ×2 IMPLANT
GLOVE BIOGEL PI IND STRL 8 (GLOVE) ×1 IMPLANT
GLOVE BIOGEL PI INDICATOR 8 (GLOVE) ×1
GOWN STRL REUS W/ TWL LRG LVL3 (GOWN DISPOSABLE) ×1 IMPLANT
GOWN STRL REUS W/ TWL XL LVL3 (GOWN DISPOSABLE) ×1 IMPLANT
GOWN STRL REUS W/TWL LRG LVL3 (GOWN DISPOSABLE) ×4
GOWN STRL REUS W/TWL XL LVL3 (GOWN DISPOSABLE) ×2
INTRODUCER COOK 11FR (CATHETERS) IMPLANT
KIT BASIN OR (CUSTOM PROCEDURE TRAY) ×2 IMPLANT
KIT PORT POWER 8FR ISP CVUE (Port) ×1 IMPLANT
KIT TURNOVER KIT B (KITS) ×2 IMPLANT
NS IRRIG 1000ML POUR BTL (IV SOLUTION) ×2 IMPLANT
PAD ARMBOARD 7.5X6 YLW CONV (MISCELLANEOUS) ×2 IMPLANT
PENCIL BUTTON HOLSTER BLD 10FT (ELECTRODE) ×2 IMPLANT
POSITIONER HEAD DONUT 9IN (MISCELLANEOUS) ×2 IMPLANT
SET INTRODUCER 12FR PACEMAKER (INTRODUCER) IMPLANT
SET SHEATH INTRODUCER 10FR (MISCELLANEOUS) IMPLANT
SHEATH COOK PEEL AWAY SET 9F (SHEATH) IMPLANT
SUT MNCRL AB 4-0 PS2 18 (SUTURE) ×2 IMPLANT
SUT PROLENE 2 0 SH 30 (SUTURE) ×2 IMPLANT
SUT VIC AB 3-0 SH 27 (SUTURE) ×2
SUT VIC AB 3-0 SH 27X BRD (SUTURE) ×1 IMPLANT
SYR 20ML ECCENTRIC (SYRINGE) ×1 IMPLANT
SYR 5ML LUER SLIP (SYRINGE) ×2 IMPLANT
TOWEL GREEN STERILE (TOWEL DISPOSABLE) ×2 IMPLANT
TOWEL GREEN STERILE FF (TOWEL DISPOSABLE) ×2 IMPLANT
TRAY LAPAROSCOPIC MC (CUSTOM PROCEDURE TRAY) ×2 IMPLANT

## 2020-02-28 NOTE — Interval H&P Note (Signed)
History and Physical Interval Note:  02/28/2020 7:20 AM  [name]  has presented today for surgery, with the diagnosis of PORT MALFUNCTION.  The various methods of treatment have been discussed with the patient and family. After consideration of risks, benefits and other options for treatment, the patient has consented to  Procedure(s): PORT A CATH REVISION (N/A) as a surgical intervention.  The patient's history has been reviewed, patient examined, no change in status, stable for surgery.  I have reviewed the patient's chart and labs.  Questions were answered to the patient's satisfaction.     [place]

## 2020-02-28 NOTE — Anesthesia Postprocedure Evaluation (Signed)
Anesthesia Post Note  Patient: [name]  Procedure(s) Performed: PORT A CATH REVISION (Right Chest)     Patient location during evaluation: PACU Anesthesia Type: MAC Level of consciousness: awake and alert Pain management: pain level controlled Vital Signs Assessment: post-procedure vital signs reviewed and stable Respiratory status: spontaneous breathing, nonlabored ventilation, respiratory function stable and patient connected to nasal cannula oxygen Cardiovascular status: stable and blood pressure returned to baseline Postop Assessment: no apparent nausea or vomiting Anesthetic complications: no    Last Vitals:  Vitals:   02/28/20 0827 02/28/20 0841  BP: (!) 142/93   Pulse: 89 84  Resp: 12 17  Temp: 36.5 C   SpO2: 100% 97%    Last Pain:  Vitals:   02/28/20 0623  TempSrc:   PainSc: 0-No pain                 [place]et [place]

## 2020-02-28 NOTE — Discharge Instructions (Signed)
    PORT-A-CATH: POST OP INSTRUCTIONS  Always review your discharge instruction sheet given to you by the facility where your surgery was performed.   1. A prescription for pain medication may be given to you upon discharge. Take your pain medication as prescribed, if needed. If narcotic pain medicine is not needed, then you make take acetaminophen (Tylenol) or ibuprofen (Advil) as needed.  2. Take your usually prescribed medications unless otherwise directed. 3. If you need a refill on your pain medication, please contact our office. All narcotic pain medicine now requires a paper prescription.  Phoned in and fax refills are no longer allowed by law.  Prescriptions will not be filled after 5 pm or on weekends.  4. You should follow a light diet for the remainder of the day after your procedure. 5. Most patients will experience some mild swelling and/or bruising in the area of the incision. It may take several days to resolve. 6. It is common to experience some constipation if taking pain medication after surgery. Increasing fluid intake and taking a stool softener (such as Colace) will usually help or prevent this problem from occurring. A mild laxative (Milk of Magnesia or Miralax) should be taken according to package directions if there are no bowel movements after 48 hours.  7. Unless discharge instructions indicate otherwise, you may remove your bandages 48 hours after surgery, and you may shower at that time. You may have steri-strips (small white skin tapes) in place directly over the incision.  These strips should be left on the skin for 7-10 days.  If your surgeon used Dermabond (skin glue) on the incision, you may shower in 24 hours.  The glue will flake off over the next 2-3 weeks.  8. If your port is left accessed at the end of surgery (needle left in port), the dressing cannot get wet and should only by changed by a healthcare professional. When the port is no longer accessed (when the  needle has been removed), follow step 7.   9. ACTIVITIES:  Limit activity involving your arms for the next 72 hours. Do no strenuous exercise or activity for 1 week. You may drive when you are no longer taking prescription pain medication, you can comfortably wear a seatbelt, and you can maneuver your car. 10.You may need to see your doctor in the office for a follow-up appointment.  Please       check with your doctor.  11.When you receive a new Port-a-Cath, you will get a product guide and        ID card.  Please keep them in case you need them.  WHEN TO CALL YOUR DOCTOR ([phone]): 1. Fever over 101.0 2. Chills 3. Continued bleeding from incision 4. Increased redness and tenderness at the site 5. Shortness of breath, difficulty breathing   The clinic staff is available to answer your questions during regular business hours. Please don't hesitate to call and ask to speak to one of the nurses or medical assistants for clinical concerns. If you have a medical emergency, go to the nearest emergency room or call 911.  A surgeon from [facility] is always on call at the hospital.     For further information, please visit [email or web address]      

## 2020-02-28 NOTE — Op Note (Signed)
Preoperative diagnosis: Port malfunction  Postop diagnosis: Proper port position noted without flipping.  Slight angulation noted but port was properly positioned  Procedure: Revision of Port-A-Cath with adjustment in subcutaneous pocket  Surgeon: Erroll Luna, MD  Anesthesia: MAC with 0.25% Sensorcaine local  EBL: Minimal  Specimen: None  Indications for procedure: The patient is a 69-year-old female who had a Port-A-Cath placed for chemotherapy.  Apparently, there was some issues accessing it at the cancer center.  This it was felt to have flipped or turned on its side.  I saw her last week in the office in he was difficult to palpate but I did feel like there was some slight angulation to it.  The oncology center was unable to access it has been giving her chemotherapy through a peripheral vein.  I recommended exploration in the operating room and readjustment as necessary.  Risk, benefits and other treatment options discussed with the patient.  I discussed possible replacement of the port if necessary.The procedure has been discussed with the patient.  Alternative therapies have been discussed with the patient.  Operative risks include bleeding,  Infection,  Organ injury,  Nerve injury,  Blood vessel injury,  DVT,  Pulmonary embolism,  Death,  And possible reoperation.  Medical management risks include worsening of present situation.  The success of the procedure is 50 -90 % at treating patients symptoms.  The patient understands and agrees to proceed.   Description of procedure: The patient was met in the holding area and the procedure reviewed.  Questions were answered.  She was taken back to the operating room.  She was placed supine upon the OR table.  After induction of MAC anesthesia the right subclavian region was prepped and draped in sterile fashion and timeout was performed.  She received appropriate preoperative antibiotics.  Local anesthetic consisting of 0.25% Sensorcaine was  infiltrated along the incision at the port site.  This was opened.  The cavity was entered.  Hemostasis achieved with cautery.  The cavity was examined.  The port was actually in proper position.  There is some angulation to the port and it was tilted slightly medially.  I opened the pocket with my finger further.  I then placed a second suture of 2-0 Prolene to adjust the position of the port.  This seemed to help pull it a little more flat and took out the angulation.  It was not flipped nor was it on that side at all.  I then laid the skin over it.  It appeared to be parallel to the skin as it should be.  I then accessed the port.  I drew back dark nonpulsatile blood.  This was done numerous times to ensure that it was working properly.  It was then flushed numerous times with heparinized saline.  I then placed 5 cc of heparin saline concentrate back into the port as per protocol.  I reexamined the relationship of the port to the skin and appeared to be oriented properly.  After ensuring hemostasis I irrigated out the port pocket with bacitracin solution.  Hemostasis was achieved and the port appeared to be in good position.  C-arm was brought in the tip was verified to be in the superior vena cava without any evidence of migration and there is no kinking of the port upon examination with the C arm.  At this point I closed the wound with 3-0 Vicryl and 4-0 Monocryl.  Dermabond applied.  All counts were found to be  correct.  The patient was then awoke taken recovery in satisfactory condition.

## 2020-02-28 NOTE — Transfer of Care (Signed)
Immediate Anesthesia Transfer of Care Note  Patient: [name]  Procedure(s) Performed: PORT A CATH REVISION (Right Chest)  Patient Location: PACU  Anesthesia Type:MAC  Level of Consciousness: drowsy and patient cooperative  Airway & Oxygen Therapy: Patient Spontanous Breathing  Post-op Assessment: Report given to RN and Post -op Vital signs reviewed and stable  Post vital signs: Reviewed and stable  Last Vitals:  Vitals Value Taken Time  BP 142/93 02/28/20 0826  Temp    Pulse    Resp    SpO2    Vitals shown include unvalidated device data.  Last Pain:  Vitals:   02/28/20 0623  TempSrc:   PainSc: 0-No pain         Complications: No apparent anesthesia complications

## 2020-02-29 ENCOUNTER — Encounter: Payer: Self-pay | Admitting: *Deleted

## 2020-03-04 ENCOUNTER — Inpatient Hospital Stay: Payer: Medicare PPO

## 2020-03-04 ENCOUNTER — Inpatient Hospital Stay: Payer: Medicare PPO | Admitting: Medical

## 2020-03-04 ENCOUNTER — Other Ambulatory Visit: Payer: Self-pay

## 2020-03-04 VITALS — BP 150/87 | HR 86 | Temp 97.8°F | Resp 16 | Wt 250.5 lb

## 2020-03-04 DIAGNOSIS — Z170 Estrogen receptor positive status [ER+]: Secondary | ICD-10-CM

## 2020-03-04 DIAGNOSIS — C50211 Malignant neoplasm of upper-inner quadrant of right female breast: Secondary | ICD-10-CM

## 2020-03-04 DIAGNOSIS — Z95828 Presence of other vascular implants and grafts: Secondary | ICD-10-CM

## 2020-03-04 DIAGNOSIS — Z5112 Encounter for antineoplastic immunotherapy: Secondary | ICD-10-CM | POA: Diagnosis not present

## 2020-03-04 LAB — CBC WITH DIFFERENTIAL (CANCER CENTER ONLY)
Abs Immature Granulocytes: 0.03 10*3/uL (ref 0.00–0.07)
Basophils Absolute: 0 10*3/uL (ref 0.0–0.1)
Basophils Relative: 0 %
Eosinophils Absolute: 0.1 10*3/uL (ref 0.0–0.5)
Eosinophils Relative: 2 %
HCT: 33 % — ABNORMAL LOW (ref 36.0–46.0)
Hemoglobin: 10.7 g/dL — ABNORMAL LOW (ref 12.0–15.0)
Immature Granulocytes: 1 %
Lymphocytes Relative: 27 %
Lymphs Abs: 1 10*3/uL (ref 0.7–4.0)
MCH: 31.9 pg (ref 26.0–34.0)
MCHC: 32.4 g/dL (ref 30.0–36.0)
MCV: 98.5 fL (ref 80.0–100.0)
Monocytes Absolute: 0.2 10*3/uL (ref 0.1–1.0)
Monocytes Relative: 6 %
Neutro Abs: 2.3 10*3/uL (ref 1.7–7.7)
Neutrophils Relative %: 64 %
Platelet Count: 249 10*3/uL (ref 150–400)
RBC: 3.35 MIL/uL — ABNORMAL LOW (ref 3.87–5.11)
RDW: 13.3 % (ref 11.5–15.5)
WBC Count: 3.6 10*3/uL — ABNORMAL LOW (ref 4.0–10.5)
nRBC: 0 % (ref 0.0–0.2)

## 2020-03-04 LAB — CMP (CANCER CENTER ONLY)
ALT: 29 U/L (ref 0–44)
AST: 23 U/L (ref 15–41)
Albumin: 3.3 g/dL — ABNORMAL LOW (ref 3.5–5.0)
Alkaline Phosphatase: 74 U/L (ref 38–126)
Anion gap: 8 (ref 5–15)
BUN: 15 mg/dL (ref 8–23)
CO2: 25 mmol/L (ref 22–32)
Calcium: 8.4 mg/dL — ABNORMAL LOW (ref 8.9–10.3)
Chloride: 107 mmol/L (ref 98–111)
Creatinine: 0.74 mg/dL (ref 0.44–1.00)
GFR, Est AFR Am: >60 mL/min (ref >60)
GFR, Estimated: >60 mL/min (ref >60)
Glucose, Bld: 114 mg/dL — ABNORMAL HIGH (ref 70–99)
Potassium: 3.8 mmol/L (ref 3.5–5.1)
Sodium: 140 mmol/L (ref 135–145)
Total Bilirubin: 0.4 mg/dL (ref 0.3–1.2)
Total Protein: 6.1 g/dL — ABNORMAL LOW (ref 6.5–8.1)

## 2020-03-04 MED ORDER — DIPHENHYDRAMINE HCL 50 MG/ML IJ SOLN
25.0000 mg | Freq: Once | INTRAMUSCULAR | Status: AC
  Administered 2020-03-04: 25 mg via INTRAVENOUS

## 2020-03-04 MED ORDER — HEPARIN SOD (PORK) LOCK FLUSH 100 UNIT/ML IV SOLN
500.0000 [IU] | Freq: Once | INTRAVENOUS | Status: AC | PRN
  Administered 2020-03-04: 500 [IU]
  Filled 2020-03-04: qty 5

## 2020-03-04 MED ORDER — FAMOTIDINE IN NACL 20-0.9 MG/50ML-% IV SOLN
INTRAVENOUS | Status: AC
  Filled 2020-03-04: qty 50

## 2020-03-04 MED ORDER — SODIUM CHLORIDE 0.9% FLUSH
10.0000 mL | INTRAVENOUS | Status: DC | PRN
  Administered 2020-03-04: 10 mL via INTRAVENOUS
  Filled 2020-03-04: qty 10

## 2020-03-04 MED ORDER — DIPHENHYDRAMINE HCL 50 MG/ML IJ SOLN
INTRAMUSCULAR | Status: AC
  Filled 2020-03-04: qty 1

## 2020-03-04 MED ORDER — ACETAMINOPHEN 325 MG PO TABS
ORAL_TABLET | ORAL | Status: AC
  Filled 2020-03-04: qty 2

## 2020-03-04 MED ORDER — ACETAMINOPHEN 325 MG PO TABS
650.0000 mg | ORAL_TABLET | Freq: Once | ORAL | Status: AC
  Administered 2020-03-04: 650 mg via ORAL

## 2020-03-04 MED ORDER — SODIUM CHLORIDE 0.9 % IV SOLN
Freq: Once | INTRAVENOUS | Status: AC
  Filled 2020-03-04: qty 250

## 2020-03-04 MED ORDER — DEXAMETHASONE SODIUM PHOSPHATE 10 MG/ML IJ SOLN
INTRAMUSCULAR | Status: AC
  Filled 2020-03-04: qty 1

## 2020-03-04 MED ORDER — SODIUM CHLORIDE 0.9 % IV SOLN
80.0000 mg/m2 | Freq: Once | INTRAVENOUS | Status: AC
  Administered 2020-03-04: 180 mg via INTRAVENOUS
  Filled 2020-03-04: qty 30

## 2020-03-04 MED ORDER — TRASTUZUMAB-ANNS CHEMO 150 MG IV SOLR
2.0000 mg/kg | Freq: Once | INTRAVENOUS | Status: AC
  Administered 2020-03-04: 210 mg via INTRAVENOUS
  Filled 2020-03-04: qty 10

## 2020-03-04 MED ORDER — DEXAMETHASONE SODIUM PHOSPHATE 10 MG/ML IJ SOLN
10.0000 mg | Freq: Once | INTRAMUSCULAR | Status: AC
  Administered 2020-03-04: 10 mg via INTRAVENOUS

## 2020-03-04 MED ORDER — HEPARIN SOD (PORK) LOCK FLUSH 100 UNIT/ML IV SOLN
500.0000 [IU] | Freq: Once | INTRAVENOUS | Status: DC
  Filled 2020-03-04: qty 5

## 2020-03-04 MED ORDER — FAMOTIDINE IN NACL 20-0.9 MG/50ML-% IV SOLN
20.0000 mg | Freq: Once | INTRAVENOUS | Status: AC
  Administered 2020-03-04: 20 mg via INTRAVENOUS

## 2020-03-04 MED ORDER — SODIUM CHLORIDE 0.9% FLUSH
10.0000 mL | INTRAVENOUS | Status: DC | PRN
  Administered 2020-03-04: 10 mL
  Filled 2020-03-04: qty 10

## 2020-03-04 NOTE — Patient Instructions (Signed)
[facility] Discharge Instructions for Patients Receiving Chemotherapy  Today you received the following chemotherapy agents: trastuzumab (Kanjinti)/paclitaxel (Taxol)  To help prevent nausea and vomiting after your treatment, we encourage you to take your nausea medication as directed.   If you develop nausea and vomiting that is not controlled by your nausea medication, call the clinic.   BELOW ARE SYMPTOMS THAT SHOULD BE REPORTED IMMEDIATELY:  *FEVER GREATER THAN 100.5 F  *CHILLS WITH OR WITHOUT FEVER  NAUSEA AND VOMITING THAT IS NOT CONTROLLED WITH YOUR NAUSEA MEDICATION  *UNUSUAL SHORTNESS OF BREATH  *UNUSUAL BRUISING OR BLEEDING  TENDERNESS IN MOUTH AND THROAT WITH OR WITHOUT PRESENCE OF ULCERS  *URINARY PROBLEMS  *BOWEL PROBLEMS  UNUSUAL RASH Items with * indicate a potential emergency and should be followed up as soon as possible.  Feel free to call the clinic should you have any questions or concerns. The clinic phone number is [phone].  Please show the [facility] at check-in to the Emergency Department and triage nurse.

## 2020-03-05 ENCOUNTER — Telehealth: Payer: Self-pay | Admitting: Hematology and Oncology

## 2020-03-05 NOTE — Progress Notes (Signed)
The patient was seen in the infusion room.  Her right chest wall Port-A-Cath was examined.  There is an area of hyperpigmentation which appears to be dried blood mixed in with surgical adhesive in the lateral aspect of her surgical scar.  There is no evidence of exudate, erythema, or increased warmth.  The patient was reassured that her report looks normal.  Sandi Mealy, MHS, PA-C Physician Assistant

## 2020-03-05 NOTE — Telephone Encounter (Signed)
Called pt per 3/17 sch message - pt says to leave appts as is .

## 2020-03-07 ENCOUNTER — Telehealth: Payer: Self-pay | Admitting: Hematology and Oncology

## 2020-03-07 NOTE — Telephone Encounter (Signed)
Called patient regarding 03/23 appointments, patient will keep appointments as is.

## 2020-03-10 NOTE — Progress Notes (Signed)
Patient Care Team: Maurice Small, MD as PCP - General (Family Medicine) Rockwell Germany, RN as Oncology Nurse Navigator Tressie Ellis, Paulette Blanch, RN as Oncology Nurse Navigator Erroll Luna, MD as Consulting Physician (General Surgery) Nicholas Lose, MD as Consulting Physician (Hematology and Oncology) Gery Pray, MD as Consulting Physician (Radiation Oncology)  DIAGNOSIS:    ICD-10-CM   1. Malignant neoplasm of upper-inner quadrant of right breast in female, estrogen receptor positive ([place])  C50.211    Z17.0     SUMMARY OF ONCOLOGIC HISTORY: Oncology History  Malignant neoplasm of upper-inner quadrant of right breast in female, estrogen receptor positive ([place])  12/26/2019 Initial Diagnosis   Patient palpated a right breast lump x1wk. Mammogram and US showed two adjacent masses at the 2 o'clock position measuring 1.4cm and 0.6cm, calcifications in the outer right breast at the 9 o'clock position, no right axillary adenopathy. Biopsy showed IDC at the 2 o'clock position, grade 3, HER-2 + (3+), ER+ 90%, PR+ 30%, Ki67 30%, and DCIS in the upper outer right breast, high grade, ER+ 95%, PR 90%.   01/22/2020 Surgery   Right breast lumpectomy x2 ([facility]): Medial position: IDC, grade 3, 2.3cm, with high grade DCIS, clear margins Lateral position: high grade DCIS, clear margins, 4 right axillary lymph nodes negative    01/22/2020 Cancer Staging   Staging form: Breast, AJCC 8th Edition - Pathologic stage from 01/22/2020: Stage IA (pT2, pN0, cM0, G3, ER+, PR+, HER2+) - Signed by Gardenia Phlegm, NP on 02/06/2020   02/19/2020 -  Chemotherapy   The patient had PACLitaxel (TAXOL) 180 mg in sodium chloride 0.9 % 250 mL chemo infusion (</= 30m/m2), 80 mg/m2 = 180 mg, Intravenous,  Once, 1 of 3 cycles Administration: 180 mg (02/19/2020), 180 mg (02/26/2020), 180 mg (03/04/2020) trastuzumab-anns (KANJINTI) 441 mg in sodium chloride 0.9 % 250 mL chemo infusion, 4 mg/kg = 441 mg (100 % of original dose 4  mg/kg), Intravenous,  Once, 1 of 16 cycles Dose modification: 4 mg/kg (original dose 4 mg/kg, Cycle 1, Reason: Other (see comments)), 6 mg/kg (original dose 6 mg/kg, Cycle 4, Reason: Other (see comments), Comment: insurance preferred biosimilar), 6 mg/kg (original dose 6 mg/kg, Cycle 10, Reason: Other (see comments)) Administration: 441 mg (02/19/2020), 210 mg (02/26/2020), 210 mg (03/04/2020)  for chemotherapy treatment.      CHIEF COMPLIANT: Cycle 4 Taxol Herceptin  INTERVAL HISTORY: Isabel REINEis a 69y.o. with above-mentioned history of right breast cancerwhounderwent a right breast lumpectomy x2and is currently on adjuvant chemotherapy with Taxol Herceptin.She underwent a port correction on 3/11/2 with Dr. CBrantley Stage She presents to the clinic todayfor a toxicity check and cycle 4.  She is complaining of lower extremity swelling for the past week or so.  Left greater than right.  There is no associated pain.  She tells me that she has not been active at all in fact she has been sleeping or lying down most of the day.   ALLERGIES:  is allergic to aTeachers Insurance and Annuity Associationtartrate].  MEDICATIONS:  Current Outpatient Medications  Medication Sig Dispense Refill  . amitriptyline (ELAVIL) 25 MG tablet Take 75 mg by mouth at bedtime.     .Marland KitchenamLODipine (NORVASC) 5 MG tablet Take 7.5 mg by mouth at bedtime.     . Buprenorphine HCl (BELBUCA) 150 MCG FILM Place 150 mcg inside cheek 2 (two) times daily as needed (pain.).     .Marland Kitchendiazepam (VALIUM) 10 MG tablet Take 1 tablet (10 mg total) by mouth  at bedtime as needed for anxiety. (Patient taking differently: Take 10 mg by mouth at bedtime as needed for sleep. ) 30 tablet 2  . doxycycline (VIBRAMYCIN) 50 MG capsule Take 2 capsules (100 mg total) by mouth 2 (two) times daily. 24 capsule 0  . FLUoxetine (PROZAC) 40 MG capsule Take 80 mg by mouth at bedtime.     Marland Kitchen HYDROcodone-acetaminophen (NORCO/VICODIN) 5-325 MG tablet Take 1 tablet by mouth every 6 (six)  hours as needed for moderate pain. 10 tablet 0  . ibuprofen (ADVIL,MOTRIN) 200 MG tablet Take 400-600 mg by mouth 2 (two) times daily as needed for moderate pain.     Marland Kitchen levothyroxine (SYNTHROID, LEVOTHROID) 137 MCG tablet Take 137 mcg by mouth at bedtime.     . lidocaine-prilocaine (EMLA) cream Apply to affected area once (Patient taking differently: Apply 1 application topically daily as needed (prior to port being accessed.). Apply to affected area once) 30 g 3  . ondansetron (ZOFRAN) 8 MG tablet Take 1 tablet (8 mg total) by mouth 2 (two) times daily as needed (Nausea or vomiting). 30 tablet 1  . prochlorperazine (COMPAZINE) 10 MG tablet Take 1 tablet (10 mg total) by mouth every 6 (six) hours as needed (Nausea or vomiting). 30 tablet 1  . traMADol (ULTRAM) 50 MG tablet Take 1 tablet (50 mg total) by mouth every 6 (six) hours as needed. 30 tablet 0   No current facility-administered medications for this visit.   Facility-Administered Medications Ordered in Other Visits  Medication Dose Route Frequency Provider Last Rate Last Admin  . sodium chloride flush (NS) 0.9 % injection 10 mL  10 mL Intravenous PRN Nicholas Lose, MD   10 mL at 03/11/20 0900    PHYSICAL EXAMINATION: ECOG PERFORMANCE STATUS: 1 - Symptomatic but completely ambulatory  There were no vitals filed for this visit. There were no vitals filed for this visit.  LABORATORY DATA:  I have reviewed the data as listed CMP Latest Ref Rng & Units 03/04/2020 02/26/2020 02/19/2020  Glucose 70 - 99 mg/dL 114(H) 131(H) 118(H)  BUN 8 - 23 mg/dL '15 22 17  ' Creatinine 0.44 - 1.00 mg/dL 0.74 0.90 0.97  Sodium 135 - 145 mmol/L 140 138 142  Potassium 3.5 - 5.1 mmol/L 3.8 4.2 4.4  Chloride 98 - 111 mmol/L 107 105 105  CO2 22 - 32 mmol/L '25 26 28  ' Calcium 8.9 - 10.3 mg/dL 8.4(L) 8.4(L) 8.8(L)  Total Protein 6.5 - 8.1 g/dL 6.1(L) 6.4(L) 6.3(L)  Total Bilirubin 0.3 - 1.2 mg/dL 0.4 0.5 0.3  Alkaline Phos 38 - 126 U/L 74 88 96  AST 15 - 41 U/L  '23 26 17  ' ALT 0 - 44 U/L 29 33 17    Lab Results  Component Value Date   WBC 3.6 (L) 03/04/2020   HGB 10.7 (L) 03/04/2020   HCT 33.0 (L) 03/04/2020   MCV 98.5 03/04/2020   PLT 249 03/04/2020   NEUTROABS 2.3 03/04/2020    ASSESSMENT & PLAN:  Malignant neoplasm of upper-inner quadrant of right breast in female, estrogen receptor positive ([place]) 01/22/2020: Right medial lumpectomy: Grade 3 IDC 2.3 cm with high-grade DCIS with necrosis, margins negative, negative for lymphovascular or perineural invasion, Right lateral lumpectomy: Isolated foci of DCIS high-grade, resection margins negative 4 lymph nodes negative, ER 90%, PR 30%, HER-2 3+ positive, Ki-67 30%  Treatment plan: 1.Adjuvant chemotherapy with Taxol Herceptin weekly x12 followed by Herceptin maintenance 2.Adjuvant radiation therapy 3.Followed by adjuvant antiestrogen therapy. ----------------------------------------------------------------------------------------------------------------------------------------------------------- Current treatment:  Cycle 4 Taxol Herceptin Echocardiogram 02/04/2020: EF 60 to 65%  Chemo toxicities: Denies any nausea or vomiting.  Denies any diarrhea. Denies fatigue. Lower extremity edema left greater than right.  I discussed with her that it is wise to obtain ultrasound of the lower extremity but she does not want to do it at this time.  She wants to try the Lasix for a week and see what happens.  I sent a prescription for basis 20 mg daily and encouraged her to eat more potassium containing foods.  Return to clinic weekly for chemo and every other week for follow-up with me.   No orders of the defined types were placed in this encounter.  The patient has a good understanding of the overall plan. she agrees with it. she will call with any problems that may develop before the next visit here.  Total time spent: 30 mins including face to face time and time spent for planning, charting and  coordination of care  Nicholas Lose, MD 03/11/2020  I, Cloyde Reams Dorshimer, am acting as scribe for Dr. Nicholas Lose.  I have reviewed the above documentation for accuracy and completeness, and I agree with the above.

## 2020-03-11 ENCOUNTER — Inpatient Hospital Stay: Payer: Medicare PPO

## 2020-03-11 ENCOUNTER — Inpatient Hospital Stay (HOSPITAL_BASED_OUTPATIENT_CLINIC_OR_DEPARTMENT_OTHER): Payer: Medicare PPO | Admitting: Hematology and Oncology

## 2020-03-11 ENCOUNTER — Other Ambulatory Visit: Payer: Self-pay

## 2020-03-11 DIAGNOSIS — C50211 Malignant neoplasm of upper-inner quadrant of right female breast: Secondary | ICD-10-CM | POA: Diagnosis not present

## 2020-03-11 DIAGNOSIS — Z170 Estrogen receptor positive status [ER+]: Secondary | ICD-10-CM | POA: Diagnosis not present

## 2020-03-11 DIAGNOSIS — Z95828 Presence of other vascular implants and grafts: Secondary | ICD-10-CM

## 2020-03-11 DIAGNOSIS — Z5112 Encounter for antineoplastic immunotherapy: Secondary | ICD-10-CM | POA: Diagnosis not present

## 2020-03-11 LAB — CBC WITH DIFFERENTIAL (CANCER CENTER ONLY)
Abs Immature Granulocytes: 0.05 10*3/uL (ref 0.00–0.07)
Basophils Absolute: 0 10*3/uL (ref 0.0–0.1)
Basophils Relative: 0 %
Eosinophils Absolute: 0.1 10*3/uL (ref 0.0–0.5)
Eosinophils Relative: 3 %
HCT: 33.3 % — ABNORMAL LOW (ref 36.0–46.0)
Hemoglobin: 10.7 g/dL — ABNORMAL LOW (ref 12.0–15.0)
Immature Granulocytes: 1 %
Lymphocytes Relative: 25 %
Lymphs Abs: 1.2 10*3/uL (ref 0.7–4.0)
MCH: 31.3 pg (ref 26.0–34.0)
MCHC: 32.1 g/dL (ref 30.0–36.0)
MCV: 97.4 fL (ref 80.0–100.0)
Monocytes Absolute: 0.3 10*3/uL (ref 0.1–1.0)
Monocytes Relative: 7 %
Neutro Abs: 3 10*3/uL (ref 1.7–7.7)
Neutrophils Relative %: 64 %
Platelet Count: 235 10*3/uL (ref 150–400)
RBC: 3.42 MIL/uL — ABNORMAL LOW (ref 3.87–5.11)
RDW: 13.6 % (ref 11.5–15.5)
WBC Count: 4.6 10*3/uL (ref 4.0–10.5)
nRBC: 0 % (ref 0.0–0.2)

## 2020-03-11 LAB — CMP (CANCER CENTER ONLY)
ALT: 22 U/L (ref 0–44)
AST: 16 U/L (ref 15–41)
Albumin: 3.3 g/dL — ABNORMAL LOW (ref 3.5–5.0)
Alkaline Phosphatase: 79 U/L (ref 38–126)
Anion gap: 9 (ref 5–15)
BUN: 9 mg/dL (ref 8–23)
CO2: 24 mmol/L (ref 22–32)
Calcium: 8.7 mg/dL — ABNORMAL LOW (ref 8.9–10.3)
Chloride: 104 mmol/L (ref 98–111)
Creatinine: 0.79 mg/dL (ref 0.44–1.00)
GFR, Est AFR Am: >60 mL/min (ref >60)
GFR, Estimated: >60 mL/min (ref >60)
Glucose, Bld: 121 mg/dL — ABNORMAL HIGH (ref 70–99)
Potassium: 3.5 mmol/L (ref 3.5–5.1)
Sodium: 137 mmol/L (ref 135–145)
Total Bilirubin: 0.4 mg/dL (ref 0.3–1.2)
Total Protein: 6.2 g/dL — ABNORMAL LOW (ref 6.5–8.1)

## 2020-03-11 MED ORDER — DIPHENHYDRAMINE HCL 50 MG/ML IJ SOLN
25.0000 mg | Freq: Once | INTRAMUSCULAR | Status: AC
  Administered 2020-03-11: 25 mg via INTRAVENOUS

## 2020-03-11 MED ORDER — SODIUM CHLORIDE 0.9 % IV SOLN
Freq: Once | INTRAVENOUS | Status: AC
  Filled 2020-03-11: qty 250

## 2020-03-11 MED ORDER — ACETAMINOPHEN 325 MG PO TABS
ORAL_TABLET | ORAL | Status: AC
  Filled 2020-03-11: qty 2

## 2020-03-11 MED ORDER — DEXAMETHASONE SODIUM PHOSPHATE 10 MG/ML IJ SOLN
INTRAMUSCULAR | Status: AC
  Filled 2020-03-11: qty 1

## 2020-03-11 MED ORDER — SODIUM CHLORIDE 0.9 % IV SOLN
80.0000 mg/m2 | Freq: Once | INTRAVENOUS | Status: AC
  Administered 2020-03-11: 180 mg via INTRAVENOUS
  Filled 2020-03-11: qty 30

## 2020-03-11 MED ORDER — SODIUM CHLORIDE 0.9% FLUSH
10.0000 mL | INTRAVENOUS | Status: DC | PRN
  Administered 2020-03-11: 10 mL via INTRAVENOUS
  Filled 2020-03-11: qty 10

## 2020-03-11 MED ORDER — DIPHENHYDRAMINE HCL 50 MG/ML IJ SOLN
INTRAMUSCULAR | Status: AC
  Filled 2020-03-11: qty 1

## 2020-03-11 MED ORDER — FAMOTIDINE IN NACL 20-0.9 MG/50ML-% IV SOLN
20.0000 mg | Freq: Once | INTRAVENOUS | Status: AC
  Administered 2020-03-11: 20 mg via INTRAVENOUS

## 2020-03-11 MED ORDER — HEPARIN SOD (PORK) LOCK FLUSH 100 UNIT/ML IV SOLN
500.0000 [IU] | Freq: Once | INTRAVENOUS | Status: AC | PRN
  Administered 2020-03-11: 500 [IU]
  Filled 2020-03-11: qty 5

## 2020-03-11 MED ORDER — SODIUM CHLORIDE 0.9% FLUSH
10.0000 mL | INTRAVENOUS | Status: DC | PRN
  Administered 2020-03-11: 10 mL
  Filled 2020-03-11: qty 10

## 2020-03-11 MED ORDER — ACETAMINOPHEN 325 MG PO TABS
650.0000 mg | ORAL_TABLET | Freq: Once | ORAL | Status: AC
  Administered 2020-03-11: 650 mg via ORAL

## 2020-03-11 MED ORDER — TRASTUZUMAB-ANNS CHEMO 150 MG IV SOLR
2.0000 mg/kg | Freq: Once | INTRAVENOUS | Status: AC
  Administered 2020-03-11: 210 mg via INTRAVENOUS
  Filled 2020-03-11: qty 10

## 2020-03-11 MED ORDER — FAMOTIDINE IN NACL 20-0.9 MG/50ML-% IV SOLN
INTRAVENOUS | Status: AC
  Filled 2020-03-11: qty 50

## 2020-03-11 MED ORDER — DEXAMETHASONE SODIUM PHOSPHATE 10 MG/ML IJ SOLN
10.0000 mg | Freq: Once | INTRAMUSCULAR | Status: AC
  Administered 2020-03-11: 10 mg via INTRAVENOUS

## 2020-03-11 MED ORDER — FUROSEMIDE 20 MG PO TABS: 20.0000 mg | ORAL_TABLET | Freq: Every day | ORAL | 0 refills | Status: DC

## 2020-03-11 NOTE — Patient Instructions (Signed)
[facility] Discharge Instructions for Patients Receiving Chemotherapy  Today you received the following chemotherapy agents: trastuzumab (Kanjinti)/paclitaxel (Taxol)  To help prevent nausea and vomiting after your treatment, we encourage you to take your nausea medication as directed.   If you develop nausea and vomiting that is not controlled by your nausea medication, call the clinic.   BELOW ARE SYMPTOMS THAT SHOULD BE REPORTED IMMEDIATELY:  *FEVER GREATER THAN 100.5 F  *CHILLS WITH OR WITHOUT FEVER  NAUSEA AND VOMITING THAT IS NOT CONTROLLED WITH YOUR NAUSEA MEDICATION  *UNUSUAL SHORTNESS OF BREATH  *UNUSUAL BRUISING OR BLEEDING  TENDERNESS IN MOUTH AND THROAT WITH OR WITHOUT PRESENCE OF ULCERS  *URINARY PROBLEMS  *BOWEL PROBLEMS  UNUSUAL RASH Items with * indicate a potential emergency and should be followed up as soon as possible.  Feel free to call the clinic should you have any questions or concerns. The clinic phone number is [phone].  Please show the [facility] at check-in to the Emergency Department and triage nurse.

## 2020-03-11 NOTE — Patient Instructions (Signed)

## 2020-03-11 NOTE — Assessment & Plan Note (Signed)
01/22/2020: Right medial lumpectomy: Grade 3 IDC 2.3 cm with high-grade DCIS with necrosis, margins negative, negative for lymphovascular or perineural invasion, Right lateral lumpectomy: Isolated foci of DCIS high-grade, resection margins negative 4 lymph nodes negative, ER 90%, PR 30%, HER-2 3+ positive, Ki-67 30%  Treatment plan: 1.Adjuvant chemotherapy with Taxol Herceptin weekly x12 followed by Herceptin maintenance 2.Adjuvant radiation therapy 3.Followed by adjuvant antiestrogen therapy. ----------------------------------------------------------------------------------------------------------------------------------------------------------- Current treatment: Cycle 4 Taxol Herceptin Echocardiogram 02/04/2020: EF 60 to 65%  Chemo toxicities: Denies any nausea or vomiting.  Denies any diarrhea. Denies fatigue.  Return to clinic weekly for chemo and every other week for follow-up with me.

## 2020-03-18 ENCOUNTER — Inpatient Hospital Stay: Payer: Medicare PPO

## 2020-03-18 ENCOUNTER — Other Ambulatory Visit: Payer: Self-pay

## 2020-03-18 ENCOUNTER — Encounter: Payer: Self-pay | Admitting: *Deleted

## 2020-03-18 VITALS — BP 162/82 | HR 92 | Temp 97.9°F | Resp 17

## 2020-03-18 DIAGNOSIS — C50211 Malignant neoplasm of upper-inner quadrant of right female breast: Secondary | ICD-10-CM

## 2020-03-18 DIAGNOSIS — Z95828 Presence of other vascular implants and grafts: Secondary | ICD-10-CM

## 2020-03-18 DIAGNOSIS — Z170 Estrogen receptor positive status [ER+]: Secondary | ICD-10-CM

## 2020-03-18 DIAGNOSIS — Z5112 Encounter for antineoplastic immunotherapy: Secondary | ICD-10-CM | POA: Diagnosis not present

## 2020-03-18 LAB — CBC WITH DIFFERENTIAL (CANCER CENTER ONLY)
Abs Immature Granulocytes: 0.02 10*3/uL (ref 0.00–0.07)
Basophils Absolute: 0 10*3/uL (ref 0.0–0.1)
Basophils Relative: 0 %
Eosinophils Absolute: 0.2 10*3/uL (ref 0.0–0.5)
Eosinophils Relative: 4 %
HCT: 34.3 % — ABNORMAL LOW (ref 36.0–46.0)
Hemoglobin: 10.9 g/dL — ABNORMAL LOW (ref 12.0–15.0)
Immature Granulocytes: 1 %
Lymphocytes Relative: 28 %
Lymphs Abs: 1 10*3/uL (ref 0.7–4.0)
MCH: 31.1 pg (ref 26.0–34.0)
MCHC: 31.8 g/dL (ref 30.0–36.0)
MCV: 97.7 fL (ref 80.0–100.0)
Monocytes Absolute: 0.4 10*3/uL (ref 0.1–1.0)
Monocytes Relative: 10 %
Neutro Abs: 2.1 10*3/uL (ref 1.7–7.7)
Neutrophils Relative %: 57 %
Platelet Count: 211 10*3/uL (ref 150–400)
RBC: 3.51 MIL/uL — ABNORMAL LOW (ref 3.87–5.11)
RDW: 14.3 % (ref 11.5–15.5)
WBC Count: 3.7 10*3/uL — ABNORMAL LOW (ref 4.0–10.5)
nRBC: 0 % (ref 0.0–0.2)

## 2020-03-18 LAB — CMP (CANCER CENTER ONLY)
ALT: 20 U/L (ref 0–44)
AST: 17 U/L (ref 15–41)
Albumin: 3 g/dL — ABNORMAL LOW (ref 3.5–5.0)
Alkaline Phosphatase: 85 U/L (ref 38–126)
Anion gap: 10 (ref 5–15)
BUN: 11 mg/dL (ref 8–23)
CO2: 27 mmol/L (ref 22–32)
Calcium: 8.1 mg/dL — ABNORMAL LOW (ref 8.9–10.3)
Chloride: 104 mmol/L (ref 98–111)
Creatinine: 0.74 mg/dL (ref 0.44–1.00)
GFR, Est AFR Am: >60 mL/min (ref >60)
GFR, Estimated: >60 mL/min (ref >60)
Glucose, Bld: 122 mg/dL — ABNORMAL HIGH (ref 70–99)
Potassium: 3.5 mmol/L (ref 3.5–5.1)
Sodium: 141 mmol/L (ref 135–145)
Total Bilirubin: 0.4 mg/dL (ref 0.3–1.2)
Total Protein: 6.2 g/dL — ABNORMAL LOW (ref 6.5–8.1)

## 2020-03-18 MED ORDER — HEPARIN SOD (PORK) LOCK FLUSH 100 UNIT/ML IV SOLN
500.0000 [IU] | Freq: Once | INTRAVENOUS | Status: DC
  Filled 2020-03-18: qty 5

## 2020-03-18 MED ORDER — SODIUM CHLORIDE 0.9 % IV SOLN
Freq: Once | INTRAVENOUS | Status: AC
  Filled 2020-03-18: qty 250

## 2020-03-18 MED ORDER — ACETAMINOPHEN 325 MG PO TABS
ORAL_TABLET | ORAL | Status: AC
  Filled 2020-03-18: qty 2

## 2020-03-18 MED ORDER — DIPHENHYDRAMINE HCL 50 MG/ML IJ SOLN
25.0000 mg | Freq: Once | INTRAMUSCULAR | Status: AC
  Administered 2020-03-18: 25 mg via INTRAVENOUS

## 2020-03-18 MED ORDER — DIPHENHYDRAMINE HCL 50 MG/ML IJ SOLN
INTRAMUSCULAR | Status: AC
  Filled 2020-03-18: qty 1

## 2020-03-18 MED ORDER — DIPHENHYDRAMINE HCL 25 MG PO CAPS
ORAL_CAPSULE | ORAL | Status: AC
  Filled 2020-03-18: qty 1

## 2020-03-18 MED ORDER — DEXAMETHASONE SODIUM PHOSPHATE 10 MG/ML IJ SOLN
10.0000 mg | Freq: Once | INTRAMUSCULAR | Status: AC
  Administered 2020-03-18: 10 mg via INTRAVENOUS

## 2020-03-18 MED ORDER — DEXAMETHASONE SODIUM PHOSPHATE 10 MG/ML IJ SOLN
INTRAMUSCULAR | Status: AC
  Filled 2020-03-18: qty 1

## 2020-03-18 MED ORDER — TRASTUZUMAB-ANNS CHEMO 150 MG IV SOLR
2.0000 mg/kg | Freq: Once | INTRAVENOUS | Status: AC
  Administered 2020-03-18: 210 mg via INTRAVENOUS
  Filled 2020-03-18: qty 10

## 2020-03-18 MED ORDER — HEPARIN SOD (PORK) LOCK FLUSH 100 UNIT/ML IV SOLN
500.0000 [IU] | Freq: Once | INTRAVENOUS | Status: AC | PRN
  Administered 2020-03-18: 500 [IU]
  Filled 2020-03-18: qty 5

## 2020-03-18 MED ORDER — SODIUM CHLORIDE 0.9% FLUSH
10.0000 mL | INTRAVENOUS | Status: DC | PRN
  Administered 2020-03-18: 10 mL
  Filled 2020-03-18: qty 10

## 2020-03-18 MED ORDER — SODIUM CHLORIDE 0.9% FLUSH
10.0000 mL | INTRAVENOUS | Status: DC | PRN
  Administered 2020-03-18: 10 mL via INTRAVENOUS
  Filled 2020-03-18: qty 10

## 2020-03-18 MED ORDER — FAMOTIDINE IN NACL 20-0.9 MG/50ML-% IV SOLN
INTRAVENOUS | Status: AC
  Filled 2020-03-18: qty 50

## 2020-03-18 MED ORDER — ACETAMINOPHEN 325 MG PO TABS
650.0000 mg | ORAL_TABLET | Freq: Once | ORAL | Status: AC
  Administered 2020-03-18: 650 mg via ORAL

## 2020-03-18 MED ORDER — FAMOTIDINE IN NACL 20-0.9 MG/50ML-% IV SOLN
20.0000 mg | Freq: Once | INTRAVENOUS | Status: AC
  Administered 2020-03-18: 20 mg via INTRAVENOUS

## 2020-03-18 MED ORDER — SODIUM CHLORIDE 0.9 % IV SOLN
80.0000 mg/m2 | Freq: Once | INTRAVENOUS | Status: AC
  Administered 2020-03-18: 180 mg via INTRAVENOUS
  Filled 2020-03-18: qty 30

## 2020-03-18 NOTE — Patient Instructions (Signed)
[facility] Discharge Instructions for Patients Receiving Chemotherapy  Today you received the following chemotherapy agents: trastuzumab (Kanjinti)/paclitaxel (Taxol)  To help prevent nausea and vomiting after your treatment, we encourage you to take your nausea medication as directed.   If you develop nausea and vomiting that is not controlled by your nausea medication, call the clinic.   BELOW ARE SYMPTOMS THAT SHOULD BE REPORTED IMMEDIATELY:  *FEVER GREATER THAN 100.5 F  *CHILLS WITH OR WITHOUT FEVER  NAUSEA AND VOMITING THAT IS NOT CONTROLLED WITH YOUR NAUSEA MEDICATION  *UNUSUAL SHORTNESS OF BREATH  *UNUSUAL BRUISING OR BLEEDING  TENDERNESS IN MOUTH AND THROAT WITH OR WITHOUT PRESENCE OF ULCERS  *URINARY PROBLEMS  *BOWEL PROBLEMS  UNUSUAL RASH Items with * indicate a potential emergency and should be followed up as soon as possible.  Feel free to call the clinic should you have any questions or concerns. The clinic phone number is [phone].  Please show the [facility] at check-in to the Emergency Department and triage nurse.

## 2020-03-19 NOTE — Progress Notes (Signed)
Pharmacist Chemotherapy Monitoring - Follow Up Assessment    I verify that I have reviewed each item in the below checklist:  . Regimen for the patient is scheduled for the appropriate day and plan matches scheduled date. Marland Kitchen Appropriate non-routine labs are ordered dependent on drug ordered. . If applicable, additional medications reviewed and ordered per protocol based on lifetime cumulative doses and/or treatment regimen.   Plan for follow-up and/or issues identified: No . I-vent associated with next due treatment: No . MD and/or nursing notified: No  [name] K 03/19/2020 9:15 AM

## 2020-03-24 NOTE — Progress Notes (Signed)
Patient Care Team: Maurice Small, MD as PCP - General (Family Medicine) Rockwell Germany, RN as Oncology Nurse Navigator Tressie Ellis, Paulette Blanch, RN as Oncology Nurse Navigator Erroll Luna, MD as Consulting Physician (General Surgery) Nicholas Lose, MD as Consulting Physician (Hematology and Oncology) Gery Pray, MD as Consulting Physician (Radiation Oncology)  DIAGNOSIS:    ICD-10-CM   1. Malignant neoplasm of upper-inner quadrant of right breast in female, estrogen receptor positive ([place])  C50.211    Z17.0     SUMMARY OF ONCOLOGIC HISTORY: Oncology History  Malignant neoplasm of upper-inner quadrant of right breast in female, estrogen receptor positive ([place])  12/26/2019 Initial Diagnosis   Patient palpated a right breast lump x1wk. Mammogram and US showed two adjacent masses at the 2 o'clock position measuring 1.4cm and 0.6cm, calcifications in the outer right breast at the 9 o'clock position, no right axillary adenopathy. Biopsy showed IDC at the 2 o'clock position, grade 3, HER-2 + (3+), ER+ 90%, PR+ 30%, Ki67 30%, and DCIS in the upper outer right breast, high grade, ER+ 95%, PR 90%.   01/22/2020 Surgery   Right breast lumpectomy x2 ([facility]): Medial position: IDC, grade 3, 2.3cm, with high grade DCIS, clear margins Lateral position: high grade DCIS, clear margins, 4 right axillary lymph nodes negative    01/22/2020 Cancer Staging   Staging form: Breast, AJCC 8th Edition - Pathologic stage from 01/22/2020: Stage IA (pT2, pN0, cM0, G3, ER+, PR+, HER2+) - Signed by Gardenia Phlegm, NP on 02/06/2020   02/19/2020 -  Chemotherapy   The patient had PACLitaxel (TAXOL) 180 mg in sodium chloride 0.9 % 250 mL chemo infusion (</= 22m/m2), 80 mg/m2 = 180 mg, Intravenous,  Once, 2 of 3 cycles Dose modification: 65 mg/m2 (original dose 80 mg/m2, Cycle 2, Reason: Dose not tolerated), 65 mg/m2 (original dose 80 mg/m2, Cycle 2, Reason: Dose not tolerated) Administration: 180 mg (02/19/2020), 180 mg  (02/26/2020), 180 mg (03/18/2020), 180 mg (03/04/2020), 180 mg (03/11/2020) trastuzumab-anns (KANJINTI) 441 mg in sodium chloride 0.9 % 250 mL chemo infusion, 4 mg/kg = 441 mg (100 % of original dose 4 mg/kg), Intravenous,  Once, 2 of 16 cycles Dose modification: 4 mg/kg (original dose 4 mg/kg, Cycle 1, Reason: Other (see comments)), 6 mg/kg (original dose 6 mg/kg, Cycle 4, Reason: Other (see comments), Comment: insurance preferred biosimilar), 6 mg/kg (original dose 6 mg/kg, Cycle 10, Reason: Other (see comments)) Administration: 441 mg (02/19/2020), 210 mg (02/26/2020), 210 mg (03/04/2020), 210 mg (03/11/2020), 210 mg (03/18/2020)  for chemotherapy treatment.      CHIEF COMPLIANT: Cycle 6Taxol Herceptin  INTERVAL HISTORY: Isabel CHRISTINEis a 69y.o. with above-mentioned history of right breast cancerwhounderwent a right breast lumpectomy x2and is currently on adjuvant chemotherapy with Taxol Herceptin.She presents to the clinic todayfora toxicity check andcycle 6.  She complains of diarrhea 4-5 times every day.  Since she started taking Imodium 2 tablets in the morning the diarrhea is under better control.  She tells me that she does not like to drink water.  She is feeling fatigued and because of that she has been mostly resting and not doing a whole lot of activity at home.  She does me that she has a lot of family and friends support.  Her left leg continues to be swollen and it slightly more swollen than before.  Slight erythema as well.  No evidence of any cellulitis.  She denies any fevers or chills.  ALLERGIES:  is allergic to aTeachers Insurance and Annuity Associationtartrate].  MEDICATIONS:  Current Outpatient Medications  Medication Sig Dispense Refill  . amitriptyline (ELAVIL) 25 MG tablet Take 75 mg by mouth at bedtime.     Marland Kitchen amLODipine (NORVASC) 5 MG tablet Take 7.5 mg by mouth at bedtime.     . Buprenorphine HCl (BELBUCA) 150 MCG FILM Place 150 mcg inside cheek 2 (two) times daily as needed (pain.).     Marland Kitchen  diazepam (VALIUM) 10 MG tablet Take 1 tablet (10 mg total) by mouth at bedtime as needed for anxiety. (Patient taking differently: Take 10 mg by mouth at bedtime as needed for sleep. ) 30 tablet 2  . doxycycline (VIBRAMYCIN) 50 MG capsule Take 2 capsules (100 mg total) by mouth 2 (two) times daily. 24 capsule 0  . FLUoxetine (PROZAC) 40 MG capsule Take 80 mg by mouth at bedtime.     . furosemide (LASIX) 20 MG tablet Take 1 tablet (20 mg total) by mouth daily. 30 tablet 0  . HYDROcodone-acetaminophen (NORCO/VICODIN) 5-325 MG tablet Take 1 tablet by mouth every 6 (six) hours as needed for moderate pain. 10 tablet 0  . ibuprofen (ADVIL,MOTRIN) 200 MG tablet Take 400-600 mg by mouth 2 (two) times daily as needed for moderate pain.     Marland Kitchen levothyroxine (SYNTHROID, LEVOTHROID) 137 MCG tablet Take 137 mcg by mouth at bedtime.     . lidocaine-prilocaine (EMLA) cream Apply to affected area once (Patient taking differently: Apply 1 application topically daily as needed (prior to port being accessed.). Apply to affected area once) 30 g 3  . ondansetron (ZOFRAN) 8 MG tablet Take 1 tablet (8 mg total) by mouth 2 (two) times daily as needed (Nausea or vomiting). 30 tablet 1  . prochlorperazine (COMPAZINE) 10 MG tablet Take 1 tablet (10 mg total) by mouth every 6 (six) hours as needed (Nausea or vomiting). 30 tablet 1  . traMADol (ULTRAM) 50 MG tablet Take 1 tablet (50 mg total) by mouth every 6 (six) hours as needed. 30 tablet 0   No current facility-administered medications for this visit.    PHYSICAL EXAMINATION: ECOG PERFORMANCE STATUS: 1 - Symptomatic but completely ambulatory  Vitals:   03/25/20 0917  BP: (!) 158/88  Pulse: 94  Resp: 18  Temp: 98 F (36.7 C)  SpO2: 99%   Filed Weights   03/25/20 0917  Weight: 246 lb 1.6 oz (111.6 kg)    LABORATORY DATA:  I have reviewed the data as listed CMP Latest Ref Rng & Units 03/25/2020 03/18/2020 03/11/2020  Glucose 70 - 99 mg/dL 129(H) 122(H) 121(H)    BUN 8 - 23 mg/dL '15 11 9  ' Creatinine 0.44 - 1.00 mg/dL 0.82 0.74 0.79  Sodium 135 - 145 mmol/L 138 141 137  Potassium 3.5 - 5.1 mmol/L 3.5 3.5 3.5  Chloride 98 - 111 mmol/L 103 104 104  CO2 22 - 32 mmol/L '26 27 24  ' Calcium 8.9 - 10.3 mg/dL 8.3(L) 8.1(L) 8.7(L)  Total Protein 6.5 - 8.1 g/dL 6.2(L) 6.2(L) 6.2(L)  Total Bilirubin 0.3 - 1.2 mg/dL 0.3 0.4 0.4  Alkaline Phos 38 - 126 U/L 76 85 79  AST 15 - 41 U/L '23 17 16  ' ALT 0 - 44 U/L '25 20 22    ' Lab Results  Component Value Date   WBC 4.8 03/25/2020   HGB 11.0 (L) 03/25/2020   HCT 34.5 (L) 03/25/2020   MCV 96.6 03/25/2020   PLT 292 03/25/2020   NEUTROABS 2.8 03/25/2020    ASSESSMENT & PLAN:  Malignant neoplasm  of upper-inner quadrant of right breast in female, estrogen receptor positive ([place]) 01/22/2020: Right medial lumpectomy: Grade 3 IDC 2.3 cm with high-grade DCIS with necrosis, margins negative, negative for lymphovascular or perineural invasion, Right lateral lumpectomy: Isolated foci of DCIS high-grade, resection margins negative 4 lymph nodes negative, ER 90%, PR 30%, HER-2 3+ positive, Ki-67 30%  Treatment plan: 1.Adjuvant chemotherapy with Taxol Herceptin weekly x12 followed by Herceptin maintenance 2.Adjuvant radiation therapy 3.Followed by adjuvant antiestrogen therapy. ----------------------------------------------------------------------------------------------------------------------------------------------------------- Current treatment: Cycle6Taxol Herceptin Echocardiogram 02/04/2020: EF 60 to 65%  Chemo toxicities: 1.  Moderate to severe fatigue: Patient has not been doing a lot of activities at home.  Because of this we will reduce the dosage of Taxol today. 2. Lower extremity edema left leg.  Ultrasound of the left lower leg will be obtained today. 3.  Major depression: Currently on Prozac. 4.  Diarrhea: Due to Herceptin.  I encouraged her to take 2 Imodium's every morning to stop the  diarrhea. Monitoring her creatinine as well as electrolytes.  No signs or symptoms of neuropathy. Return to clinicweekly for chemo and every other week for follow-up with me.   No orders of the defined types were placed in this encounter.  The patient has a good understanding of the overall plan. she agrees with it. she will call with any problems that may develop before the next visit here.  Total time spent: 30 mins including face to face time and time spent for planning, charting and coordination of care  Nicholas Lose, MD 03/25/2020  I, Cloyde Reams Dorshimer, am acting as scribe for Dr. Nicholas Lose.  I have reviewed the above documentation for accuracy and completeness, and I agree with the above.

## 2020-03-25 ENCOUNTER — Other Ambulatory Visit: Payer: Self-pay | Admitting: *Deleted

## 2020-03-25 ENCOUNTER — Other Ambulatory Visit: Payer: Self-pay

## 2020-03-25 ENCOUNTER — Inpatient Hospital Stay: Payer: Medicare PPO

## 2020-03-25 ENCOUNTER — Ambulatory Visit (HOSPITAL_COMMUNITY)
Admission: RE | Admit: 2020-03-25 | Discharge: 2020-03-25 | Disposition: A | Discharge status: Home / Self Care | Payer: Medicare PPO | Source: Ambulatory Visit | Attending: Hematology and Oncology | Admitting: Hematology and Oncology

## 2020-03-25 ENCOUNTER — Inpatient Hospital Stay: Payer: Medicare PPO | Admitting: Hematology and Oncology

## 2020-03-25 ENCOUNTER — Inpatient Hospital Stay: Payer: Medicare PPO | Attending: Hematology and Oncology

## 2020-03-25 DIAGNOSIS — C50211 Malignant neoplasm of upper-inner quadrant of right female breast: Secondary | ICD-10-CM | POA: Insufficient documentation

## 2020-03-25 DIAGNOSIS — R609 Edema, unspecified: Secondary | ICD-10-CM | POA: Diagnosis not present

## 2020-03-25 DIAGNOSIS — Z5111 Encounter for antineoplastic chemotherapy: Secondary | ICD-10-CM | POA: Insufficient documentation

## 2020-03-25 DIAGNOSIS — L03116 Cellulitis of left lower limb: Secondary | ICD-10-CM | POA: Insufficient documentation

## 2020-03-25 DIAGNOSIS — Z170 Estrogen receptor positive status [ER+]: Secondary | ICD-10-CM

## 2020-03-25 DIAGNOSIS — C50212 Malignant neoplasm of upper-inner quadrant of left female breast: Secondary | ICD-10-CM

## 2020-03-25 DIAGNOSIS — Z95828 Presence of other vascular implants and grafts: Secondary | ICD-10-CM

## 2020-03-25 DIAGNOSIS — Z5112 Encounter for antineoplastic immunotherapy: Secondary | ICD-10-CM | POA: Insufficient documentation

## 2020-03-25 LAB — CBC WITH DIFFERENTIAL (CANCER CENTER ONLY)
Abs Immature Granulocytes: 0.06 10*3/uL (ref 0.00–0.07)
Basophils Absolute: 0 10*3/uL (ref 0.0–0.1)
Basophils Relative: 1 %
Eosinophils Absolute: 0.1 10*3/uL (ref 0.0–0.5)
Eosinophils Relative: 3 %
HCT: 34.5 % — ABNORMAL LOW (ref 36.0–46.0)
Hemoglobin: 11 g/dL — ABNORMAL LOW (ref 12.0–15.0)
Immature Granulocytes: 1 %
Lymphocytes Relative: 28 %
Lymphs Abs: 1.3 10*3/uL (ref 0.7–4.0)
MCH: 30.8 pg (ref 26.0–34.0)
MCHC: 31.9 g/dL (ref 30.0–36.0)
MCV: 96.6 fL (ref 80.0–100.0)
Monocytes Absolute: 0.4 10*3/uL (ref 0.1–1.0)
Monocytes Relative: 9 %
Neutro Abs: 2.8 10*3/uL (ref 1.7–7.7)
Neutrophils Relative %: 58 %
Platelet Count: 292 10*3/uL (ref 150–400)
RBC: 3.57 MIL/uL — ABNORMAL LOW (ref 3.87–5.11)
RDW: 14.4 % (ref 11.5–15.5)
WBC Count: 4.8 10*3/uL (ref 4.0–10.5)
nRBC: 0 % (ref 0.0–0.2)

## 2020-03-25 LAB — CMP (CANCER CENTER ONLY)
ALT: 25 U/L (ref 0–44)
AST: 23 U/L (ref 15–41)
Albumin: 3.2 g/dL — ABNORMAL LOW (ref 3.5–5.0)
Alkaline Phosphatase: 76 U/L (ref 38–126)
Anion gap: 9 (ref 5–15)
BUN: 15 mg/dL (ref 8–23)
CO2: 26 mmol/L (ref 22–32)
Calcium: 8.3 mg/dL — ABNORMAL LOW (ref 8.9–10.3)
Chloride: 103 mmol/L (ref 98–111)
Creatinine: 0.82 mg/dL (ref 0.44–1.00)
GFR, Est AFR Am: >60 mL/min (ref >60)
GFR, Estimated: >60 mL/min (ref >60)
Glucose, Bld: 129 mg/dL — ABNORMAL HIGH (ref 70–99)
Potassium: 3.5 mmol/L (ref 3.5–5.1)
Sodium: 138 mmol/L (ref 135–145)
Total Bilirubin: 0.3 mg/dL (ref 0.3–1.2)
Total Protein: 6.2 g/dL — ABNORMAL LOW (ref 6.5–8.1)

## 2020-03-25 MED ORDER — SODIUM CHLORIDE 0.9% FLUSH
10.0000 mL | INTRAVENOUS | Status: DC | PRN
  Administered 2020-03-25: 10 mL via INTRAVENOUS
  Filled 2020-03-25: qty 10

## 2020-03-25 MED ORDER — SODIUM CHLORIDE 0.9 % IV SOLN
10.0000 mg | Freq: Once | INTRAVENOUS | Status: AC
  Administered 2020-03-25: 10 mg via INTRAVENOUS
  Filled 2020-03-25: qty 10

## 2020-03-25 MED ORDER — HEPARIN SOD (PORK) LOCK FLUSH 100 UNIT/ML IV SOLN
500.0000 [IU] | Freq: Once | INTRAVENOUS | Status: DC
  Filled 2020-03-25: qty 5

## 2020-03-25 MED ORDER — FAMOTIDINE IN NACL 20-0.9 MG/50ML-% IV SOLN
INTRAVENOUS | Status: AC
  Filled 2020-03-25: qty 50

## 2020-03-25 MED ORDER — SODIUM CHLORIDE 0.9% FLUSH
10.0000 mL | INTRAVENOUS | Status: DC | PRN
  Administered 2020-03-25: 10 mL
  Filled 2020-03-25: qty 10

## 2020-03-25 MED ORDER — CEPHALEXIN 500 MG PO CAPS: 500.0000 mg | ORAL_CAPSULE | Freq: Two times a day (BID) | ORAL | 0 refills | Status: DC

## 2020-03-25 MED ORDER — SODIUM CHLORIDE 0.9 % IV SOLN
Freq: Once | INTRAVENOUS | Status: AC
  Filled 2020-03-25: qty 250

## 2020-03-25 MED ORDER — TRASTUZUMAB-ANNS CHEMO 150 MG IV SOLR
2.0000 mg/kg | Freq: Once | INTRAVENOUS | Status: AC
  Administered 2020-03-25: 210 mg via INTRAVENOUS
  Filled 2020-03-25: qty 10

## 2020-03-25 MED ORDER — HEPARIN SOD (PORK) LOCK FLUSH 100 UNIT/ML IV SOLN
500.0000 [IU] | Freq: Once | INTRAVENOUS | Status: AC | PRN
  Administered 2020-03-25: 500 [IU]
  Filled 2020-03-25: qty 5

## 2020-03-25 MED ORDER — ACETAMINOPHEN 325 MG PO TABS
ORAL_TABLET | ORAL | Status: AC
  Filled 2020-03-25: qty 2

## 2020-03-25 MED ORDER — ACETAMINOPHEN 325 MG PO TABS
650.0000 mg | ORAL_TABLET | Freq: Once | ORAL | Status: AC
  Administered 2020-03-25: 650 mg via ORAL

## 2020-03-25 MED ORDER — DIPHENHYDRAMINE HCL 50 MG/ML IJ SOLN
INTRAMUSCULAR | Status: AC
  Filled 2020-03-25: qty 1

## 2020-03-25 MED ORDER — FAMOTIDINE IN NACL 20-0.9 MG/50ML-% IV SOLN
20.0000 mg | Freq: Once | INTRAVENOUS | Status: AC
  Administered 2020-03-25: 20 mg via INTRAVENOUS

## 2020-03-25 MED ORDER — SODIUM CHLORIDE 0.9 % IV SOLN
65.0000 mg/m2 | Freq: Once | INTRAVENOUS | Status: AC
  Administered 2020-03-25: 144 mg via INTRAVENOUS
  Filled 2020-03-25: qty 24

## 2020-03-25 MED ORDER — DIPHENHYDRAMINE HCL 50 MG/ML IJ SOLN
25.0000 mg | Freq: Once | INTRAMUSCULAR | Status: AC
  Administered 2020-03-25: 25 mg via INTRAVENOUS

## 2020-03-25 NOTE — Progress Notes (Signed)
Lower venous duplex       has been completed. Preliminary results can be found under CV proc through chart review. June Leap, BS, RDMS, RVT   Results called to Dr. Geralyn Flash nurse line. Left voice message.

## 2020-03-25 NOTE — Progress Notes (Signed)
Per MD pt lower extremity US negative for DVT.  MD states left leg swelling is related to cellulitis since [place] negative.  Per MD pt to start taking keflex 500 mg BID x5 days.  Prescription sent to pharmacy on file.  MD also requesting RN to reach out to our chaplin to call pt and offer emotional support.

## 2020-03-25 NOTE — Assessment & Plan Note (Addendum)
01/22/2020: Right medial lumpectomy: Grade 3 IDC 2.3 cm with high-grade DCIS with necrosis, margins negative, negative for lymphovascular or perineural invasion, Right lateral lumpectomy: Isolated foci of DCIS high-grade, resection margins negative 4 lymph nodes negative, ER 90%, PR 30%, HER-2 3+ positive, Ki-67 30%  Treatment plan: 1.Adjuvant chemotherapy with Taxol Herceptin weekly x12 followed by Herceptin maintenance 2.Adjuvant radiation therapy 3.Followed by adjuvant antiestrogen therapy. ----------------------------------------------------------------------------------------------------------------------------------------------------------- Current treatment: Cycle6Taxol Herceptin Echocardiogram 02/04/2020: EF 60 to 65%  Chemo toxicities: Denies any nausea or vomiting. Denies any diarrhea. Denies fatigue.  Lower extremity edema left greater than right. On Lasix.  I recommended ultrasound of the lower extremity previously.  Return to clinicweekly for chemo and every other week for follow-up with me.

## 2020-03-25 NOTE — Patient Instructions (Signed)
[facility] Discharge Instructions for Patients Receiving Chemotherapy  Today you received the following chemotherapy agents: Trastuzumab, Taxol  To help prevent nausea and vomiting after your treatment, we encourage you to take your nausea medication as directed.   If you develop nausea and vomiting that is not controlled by your nausea medication, call the clinic.   BELOW ARE SYMPTOMS THAT SHOULD BE REPORTED IMMEDIATELY:  *FEVER GREATER THAN 100.5 F  *CHILLS WITH OR WITHOUT FEVER  NAUSEA AND VOMITING THAT IS NOT CONTROLLED WITH YOUR NAUSEA MEDICATION  *UNUSUAL SHORTNESS OF BREATH  *UNUSUAL BRUISING OR BLEEDING  TENDERNESS IN MOUTH AND THROAT WITH OR WITHOUT PRESENCE OF ULCERS  *URINARY PROBLEMS  *BOWEL PROBLEMS  UNUSUAL RASH Items with * indicate a potential emergency and should be followed up as soon as possible.  Feel free to call the clinic should you have any questions or concerns. The clinic phone number is [phone].  Please show the CHEMO ALERT CARD at check-in to the Emergency Department and triage nurse.   

## 2020-03-26 ENCOUNTER — Encounter: Payer: Self-pay | Admitting: General Practice

## 2020-03-26 NOTE — Progress Notes (Signed)
[facility] Spiritual Care Note  Left pastoral check-in voicemail, per referral from Dr Lindi Adie via Merleen Nicely DeSota/RN, encouraging callback. Will follow up if needed.   [place], [place], [facility] Pager [phone] Voicemail [phone]

## 2020-03-26 NOTE — Progress Notes (Signed)
Pharmacist Chemotherapy Monitoring - Follow Up Assessment    I verify that I have reviewed each item in the below checklist:  . Regimen for the patient is scheduled for the appropriate day and plan matches scheduled date. Marland Kitchen Appropriate non-routine labs are ordered dependent on drug ordered. . If applicable, additional medications reviewed and ordered per protocol based on lifetime cumulative doses and/or treatment regimen.   Plan for follow-up and/or issues identified: No . I-vent associated with next due treatment: No . MD and/or nursing notified: No  Acquanetta Belling 03/26/2020 12:48 PM

## 2020-03-28 ENCOUNTER — Other Ambulatory Visit: Payer: Self-pay | Admitting: Hematology and Oncology

## 2020-03-30 ENCOUNTER — Other Ambulatory Visit: Payer: Self-pay | Admitting: Hematology and Oncology

## 2020-04-01 ENCOUNTER — Inpatient Hospital Stay: Payer: Medicare PPO

## 2020-04-01 ENCOUNTER — Inpatient Hospital Stay (HOSPITAL_BASED_OUTPATIENT_CLINIC_OR_DEPARTMENT_OTHER): Payer: Medicare PPO | Admitting: Medical

## 2020-04-01 ENCOUNTER — Other Ambulatory Visit: Payer: Self-pay

## 2020-04-01 ENCOUNTER — Other Ambulatory Visit: Payer: Self-pay | Admitting: Medical

## 2020-04-01 ENCOUNTER — Encounter: Payer: Self-pay | Admitting: General Practice

## 2020-04-01 VITALS — BP 162/79 | HR 84 | Temp 98.3°F | Resp 16 | Ht 67.0 in | Wt 255.0 lb

## 2020-04-01 DIAGNOSIS — C50211 Malignant neoplasm of upper-inner quadrant of right female breast: Secondary | ICD-10-CM

## 2020-04-01 DIAGNOSIS — Z170 Estrogen receptor positive status [ER+]: Secondary | ICD-10-CM

## 2020-04-01 DIAGNOSIS — Z95828 Presence of other vascular implants and grafts: Secondary | ICD-10-CM

## 2020-04-01 DIAGNOSIS — Z5112 Encounter for antineoplastic immunotherapy: Secondary | ICD-10-CM | POA: Diagnosis not present

## 2020-04-01 LAB — CMP (CANCER CENTER ONLY)
ALT: 28 U/L (ref 0–44)
AST: 22 U/L (ref 15–41)
Albumin: 3.4 g/dL — ABNORMAL LOW (ref 3.5–5.0)
Alkaline Phosphatase: 81 U/L (ref 38–126)
Anion gap: 9 (ref 5–15)
BUN: 11 mg/dL (ref 8–23)
CO2: 28 mmol/L (ref 22–32)
Calcium: 8.5 mg/dL — ABNORMAL LOW (ref 8.9–10.3)
Chloride: 105 mmol/L (ref 98–111)
Creatinine: 0.76 mg/dL (ref 0.44–1.00)
GFR, Est AFR Am: >60 mL/min (ref >60)
GFR, Estimated: >60 mL/min (ref >60)
Glucose, Bld: 115 mg/dL — ABNORMAL HIGH (ref 70–99)
Potassium: 4.1 mmol/L (ref 3.5–5.1)
Sodium: 142 mmol/L (ref 135–145)
Total Bilirubin: 0.4 mg/dL (ref 0.3–1.2)
Total Protein: 6.5 g/dL (ref 6.5–8.1)

## 2020-04-01 LAB — CBC WITH DIFFERENTIAL (CANCER CENTER ONLY)
Abs Immature Granulocytes: 0.08 10*3/uL — ABNORMAL HIGH (ref 0.00–0.07)
Basophils Absolute: 0 10*3/uL (ref 0.0–0.1)
Basophils Relative: 0 %
Eosinophils Absolute: 0.1 10*3/uL (ref 0.0–0.5)
Eosinophils Relative: 1 %
HCT: 33.9 % — ABNORMAL LOW (ref 36.0–46.0)
Hemoglobin: 10.9 g/dL — ABNORMAL LOW (ref 12.0–15.0)
Immature Granulocytes: 1 %
Lymphocytes Relative: 24 %
Lymphs Abs: 1.4 10*3/uL (ref 0.7–4.0)
MCH: 32.3 pg (ref 26.0–34.0)
MCHC: 32.2 g/dL (ref 30.0–36.0)
MCV: 100.6 fL — ABNORMAL HIGH (ref 80.0–100.0)
Monocytes Absolute: 0.4 10*3/uL (ref 0.1–1.0)
Monocytes Relative: 6 %
Neutro Abs: 3.8 10*3/uL (ref 1.7–7.7)
Neutrophils Relative %: 68 %
Platelet Count: 272 10*3/uL (ref 150–400)
RBC: 3.37 MIL/uL — ABNORMAL LOW (ref 3.87–5.11)
RDW: 15.6 % — ABNORMAL HIGH (ref 11.5–15.5)
WBC Count: 5.7 10*3/uL (ref 4.0–10.5)
nRBC: 0 % (ref 0.0–0.2)

## 2020-04-01 MED ORDER — ACETAMINOPHEN 325 MG PO TABS
650.0000 mg | ORAL_TABLET | Freq: Once | ORAL | Status: AC
  Administered 2020-04-01: 650 mg via ORAL

## 2020-04-01 MED ORDER — SODIUM CHLORIDE 0.9 % IV SOLN
10.0000 mg | Freq: Once | INTRAVENOUS | Status: AC
  Administered 2020-04-01: 10 mg via INTRAVENOUS
  Filled 2020-04-01: qty 10

## 2020-04-01 MED ORDER — HEPARIN SOD (PORK) LOCK FLUSH 100 UNIT/ML IV SOLN
500.0000 [IU] | Freq: Once | INTRAVENOUS | Status: DC | PRN
  Filled 2020-04-01: qty 5

## 2020-04-01 MED ORDER — SODIUM CHLORIDE 0.9 % IV SOLN
Freq: Once | INTRAVENOUS | Status: AC
  Filled 2020-04-01: qty 250

## 2020-04-01 MED ORDER — SULFAMETHOXAZOLE-TRIMETHOPRIM 800-160 MG PO TABS: 1.0000 | ORAL_TABLET | Freq: Two times a day (BID) | ORAL | 0 refills | Status: DC

## 2020-04-01 MED ORDER — DIPHENHYDRAMINE HCL 50 MG/ML IJ SOLN
25.0000 mg | Freq: Once | INTRAMUSCULAR | Status: AC
  Administered 2020-04-01: 25 mg via INTRAVENOUS

## 2020-04-01 MED ORDER — ACETAMINOPHEN 325 MG PO TABS
ORAL_TABLET | ORAL | Status: AC
  Filled 2020-04-01: qty 2

## 2020-04-01 MED ORDER — SODIUM CHLORIDE 0.9% FLUSH
10.0000 mL | INTRAVENOUS | Status: DC | PRN
  Administered 2020-04-01: 10 mL via INTRAVENOUS
  Filled 2020-04-01: qty 10

## 2020-04-01 MED ORDER — FAMOTIDINE IN NACL 20-0.9 MG/50ML-% IV SOLN
INTRAVENOUS | Status: AC
  Filled 2020-04-01: qty 50

## 2020-04-01 MED ORDER — TRASTUZUMAB-ANNS CHEMO 150 MG IV SOLR
2.0000 mg/kg | Freq: Once | INTRAVENOUS | Status: AC
  Administered 2020-04-01: 210 mg via INTRAVENOUS
  Filled 2020-04-01: qty 10

## 2020-04-01 MED ORDER — FAMOTIDINE IN NACL 20-0.9 MG/50ML-% IV SOLN
20.0000 mg | Freq: Once | INTRAVENOUS | Status: AC
  Administered 2020-04-01: 20 mg via INTRAVENOUS

## 2020-04-01 MED ORDER — SODIUM CHLORIDE 0.9% FLUSH
10.0000 mL | INTRAVENOUS | Status: DC | PRN
  Filled 2020-04-01: qty 10

## 2020-04-01 MED ORDER — DIPHENHYDRAMINE HCL 50 MG/ML IJ SOLN
INTRAMUSCULAR | Status: AC
  Filled 2020-04-01: qty 1

## 2020-04-01 MED ORDER — HEPARIN SOD (PORK) LOCK FLUSH 100 UNIT/ML IV SOLN
500.0000 [IU] | Freq: Once | INTRAVENOUS | Status: DC
  Filled 2020-04-01: qty 5

## 2020-04-01 MED ORDER — SODIUM CHLORIDE 0.9 % IV SOLN
65.0000 mg/m2 | Freq: Once | INTRAVENOUS | Status: AC
  Administered 2020-04-01: 144 mg via INTRAVENOUS
  Filled 2020-04-01: qty 24

## 2020-04-01 NOTE — Progress Notes (Signed)
[place] Spiritual Care Note  Followed up with [name] in infusion, providing pastoral presence, empathic listening, and affirmation of strengths as she shared updates about how she is doing, which, in her words, is centrally "better than I expected I would." She reports much-improved sleep, thanks to an rx from Dr Lindi Adie, which is helping quality of life. She also states that for enjoyment she regularly reads, watches tv, talks on the phone, and spends time with her dogs on the back deck. Aside from tiredness due to treatment, [name] states that she is doing well emotionally and has no concerns or needs at this time. Although benadryl makes her sleepy, we plan to check in at a future treatment.   [place], [place], [facility] Pager [phone] Voicemail [phone]

## 2020-04-01 NOTE — Patient Instructions (Signed)
[facility] Discharge Instructions for Patients Receiving Chemotherapy  Today you received the following chemotherapy agents: Trastuzumab, Taxol  To help prevent nausea and vomiting after your treatment, we encourage you to take your nausea medication as directed.   If you develop nausea and vomiting that is not controlled by your nausea medication, call the clinic.   BELOW ARE SYMPTOMS THAT SHOULD BE REPORTED IMMEDIATELY:  *FEVER GREATER THAN 100.5 F  *CHILLS WITH OR WITHOUT FEVER  NAUSEA AND VOMITING THAT IS NOT CONTROLLED WITH YOUR NAUSEA MEDICATION  *UNUSUAL SHORTNESS OF BREATH  *UNUSUAL BRUISING OR BLEEDING  TENDERNESS IN MOUTH AND THROAT WITH OR WITHOUT PRESENCE OF ULCERS  *URINARY PROBLEMS  *BOWEL PROBLEMS  UNUSUAL RASH Items with * indicate a potential emergency and should be followed up as soon as possible.  Feel free to call the clinic should you have any questions or concerns. The clinic phone number is [phone].  Please show the CHEMO ALERT CARD at check-in to the Emergency Department and triage nurse.   

## 2020-04-01 NOTE — Progress Notes (Signed)
This patient was seen in the infusion room today.  She has recently been treated for cellulitis of her left lower extremity with Keflex.  She was given a prescription for Bactrim.  She was also told to elevate her legs as much as possible and considering wearing compression hose.  She expressed understanding and agreement with this plan.  Sandi Mealy, MHS, PA-C Physician Assistant

## 2020-04-02 NOTE — Progress Notes (Signed)
Pharmacist Chemotherapy Monitoring - Follow Up Assessment    I verify that I have reviewed each item in the below checklist:  . Regimen for the patient is scheduled for the appropriate day and plan matches scheduled date. Marland Kitchen Appropriate non-routine labs are ordered dependent on drug ordered. . If applicable, additional medications reviewed and ordered per protocol based on lifetime cumulative doses and/or treatment regimen.   Plan for follow-up and/or issues identified: No . I-vent associated with next due treatment: No . MD and/or nursing notified: No  [name] K 04/02/2020 1:08 PM

## 2020-04-07 NOTE — Progress Notes (Signed)
Patient Care Team: Maurice Small, MD as PCP - General (Family Medicine) Rockwell Germany, RN as Oncology Nurse Navigator Tressie Ellis, Paulette Blanch, RN as Oncology Nurse Navigator Erroll Luna, MD as Consulting Physician (General Surgery) Nicholas Lose, MD as Consulting Physician (Hematology and Oncology) Gery Pray, MD as Consulting Physician (Radiation Oncology)  DIAGNOSIS:    ICD-10-CM   1. Malignant neoplasm of upper-inner quadrant of right breast in female, estrogen receptor positive ([place])  C50.211    Z17.0     SUMMARY OF ONCOLOGIC HISTORY: Oncology History  Malignant neoplasm of upper-inner quadrant of right breast in female, estrogen receptor positive ([place])  12/26/2019 Initial Diagnosis   Patient palpated a right breast lump x1wk. Mammogram and US showed two adjacent masses at the 2 o'clock position measuring 1.4cm and 0.6cm, calcifications in the outer right breast at the 9 o'clock position, no right axillary adenopathy. Biopsy showed IDC at the 2 o'clock position, grade 3, HER-2 + (3+), ER+ 90%, PR+ 30%, Ki67 30%, and DCIS in the upper outer right breast, high grade, ER+ 95%, PR 90%.   01/22/2020 Surgery   Right breast lumpectomy x2 ([place]): Medial position: IDC, grade 3, 2.3cm, with high grade DCIS, clear margins Lateral position: high grade DCIS, clear margins, 4 right axillary lymph nodes negative    01/22/2020 Cancer Staging   Staging form: Breast, AJCC 8th Edition - Pathologic stage from 01/22/2020: Stage IA (pT2, pN0, cM0, G3, ER+, PR+, HER2+) - Signed by Gardenia Phlegm, NP on 02/06/2020   02/19/2020 -  Chemotherapy   The patient had PACLitaxel (TAXOL) 180 mg in sodium chloride 0.9 % 250 mL chemo infusion (</= 21m/m2), 80 mg/m2 = 180 mg, Intravenous,  Once, 2 of 3 cycles Dose modification: 65 mg/m2 (original dose 80 mg/m2, Cycle 2, Reason: Dose not tolerated), 65 mg/m2 (original dose 80 mg/m2, Cycle 2, Reason: Dose not tolerated) Administration: 180 mg (02/19/2020), 180 mg  (02/26/2020), 180 mg (03/18/2020), 180 mg (03/04/2020), 180 mg (03/11/2020), 144 mg (03/25/2020), 144 mg (04/01/2020) trastuzumab-anns (KANJINTI) 441 mg in sodium chloride 0.9 % 250 mL chemo infusion, 4 mg/kg = 441 mg (100 % of original dose 4 mg/kg), Intravenous,  Once, 2 of 16 cycles Dose modification: 4 mg/kg (original dose 4 mg/kg, Cycle 1, Reason: Other (see comments)), 6 mg/kg (original dose 6 mg/kg, Cycle 4, Reason: Other (see comments), Comment: insurance preferred biosimilar), 6 mg/kg (original dose 6 mg/kg, Cycle 10, Reason: Other (see comments)) Administration: 441 mg (02/19/2020), 210 mg (02/26/2020), 210 mg (03/04/2020), 210 mg (03/11/2020), 210 mg (03/18/2020), 210 mg (03/25/2020), 210 mg (04/01/2020)  for chemotherapy treatment.      CHIEF COMPLIANT: Cycle 8Taxol Herceptin  INTERVAL HISTORY: Isabel KETTERMANis a 69y.o. with above-mentioned history of right breast cancerwhounderwent a right breast lumpectomy x2and is currently on adjuvant chemotherapy with Taxol Herceptin.She presents to the clinic todayfora toxicity check andcycle8.  She does not have any nausea or vomiting.  Her major complaint is fatigue as well as left lower extremity swelling.  ALLERGIES:  is allergic to aTeachers Insurance and Annuity Associationtartrate].  MEDICATIONS:  Current Outpatient Medications  Medication Sig Dispense Refill  . amitriptyline (ELAVIL) 25 MG tablet Take 75 mg by mouth at bedtime.     .Marland KitchenamLODipine (NORVASC) 5 MG tablet Take 7.5 mg by mouth at bedtime.     . Buprenorphine HCl (BELBUCA) 150 MCG FILM Place 150 mcg inside cheek 2 (two) times daily as needed (pain.).     .Marland KitchencephALEXin (KEFLEX) 500 MG capsule Take  1 capsule (500 mg total) by mouth 2 (two) times daily. 10 capsule 0  . diazepam (VALIUM) 10 MG tablet Take 1 tablet (10 mg total) by mouth at bedtime as needed for anxiety. (Patient taking differently: Take 10 mg by mouth at bedtime as needed for sleep. ) 30 tablet 2  . FLUoxetine (PROZAC) 40 MG capsule Take 80 mg  by mouth at bedtime.     . furosemide (LASIX) 20 MG tablet Take 1 tablet (20 mg total) by mouth daily. 30 tablet 0  . HYDROcodone-acetaminophen (NORCO/VICODIN) 5-325 MG tablet Take 1 tablet by mouth every 6 (six) hours as needed for moderate pain. 10 tablet 0  . ibuprofen (ADVIL,MOTRIN) 200 MG tablet Take 400-600 mg by mouth 2 (two) times daily as needed for moderate pain.     Marland Kitchen levothyroxine (SYNTHROID, LEVOTHROID) 137 MCG tablet Take 137 mcg by mouth at bedtime.     . lidocaine-prilocaine (EMLA) cream Apply to affected area once (Patient taking differently: Apply 1 application topically daily as needed (prior to port being accessed.). Apply to affected area once) 30 g 3  . ondansetron (ZOFRAN) 8 MG tablet Take 1 tablet (8 mg total) by mouth 2 (two) times daily as needed (Nausea or vomiting). 30 tablet 1  . sulfamethoxazole-trimethoprim (BACTRIM DS) 800-160 MG tablet Take 1 tablet by mouth 2 (two) times daily. 14 tablet 0  . traMADol (ULTRAM) 50 MG tablet TAKE ONE TABLET EVERY 6 HOURS AS NEEDED. 30 tablet 0  . prochlorperazine (COMPAZINE) 10 MG tablet Take 1 tablet (10 mg total) by mouth every 6 (six) hours as needed (Nausea or vomiting). (Patient not taking: Reported on 04/08/2020) 30 tablet 1   No current facility-administered medications for this visit.    PHYSICAL EXAMINATION: ECOG PERFORMANCE STATUS: 1 - Symptomatic but completely ambulatory  Vitals:   04/08/20 0906  BP: 136/72  Pulse: 95  Resp: 17  Temp: 98.3 F (36.8 C)  SpO2: 99%   Filed Weights   04/08/20 0906  Weight: 249 lb 9.6 oz (113.2 kg)    LABORATORY DATA:  I have reviewed the data as listed CMP Latest Ref Rng & Units 04/08/2020 04/01/2020 03/25/2020  Glucose 70 - 99 mg/dL 123(H) 115(H) 129(H)  BUN 8 - 23 mg/dL '9 11 15  ' Creatinine 0.44 - 1.00 mg/dL 0.85 0.76 0.82  Sodium 135 - 145 mmol/L 138 142 138  Potassium 3.5 - 5.1 mmol/L 3.8 4.1 3.5  Chloride 98 - 111 mmol/L 104 105 103  CO2 22 - 32 mmol/L '26 28 26  ' Calcium  8.9 - 10.3 mg/dL 8.7(L) 8.5(L) 8.3(L)  Total Protein 6.5 - 8.1 g/dL 6.4(L) 6.5 6.2(L)  Total Bilirubin 0.3 - 1.2 mg/dL 0.4 0.4 0.3  Alkaline Phos 38 - 126 U/L 87 81 76  AST 15 - 41 U/L '27 22 23  ' ALT 0 - 44 U/L 33 28 25    Lab Results  Component Value Date   WBC 5.7 04/08/2020   HGB 11.4 (L) 04/08/2020   HCT 35.6 (L) 04/08/2020   MCV 100.3 (H) 04/08/2020   PLT 263 04/08/2020   NEUTROABS 4.0 04/08/2020    ASSESSMENT & PLAN:  Malignant neoplasm of upper-inner quadrant of right breast in female, estrogen receptor positive ([place]) 01/22/2020: Right medial lumpectomy: Grade 3 IDC 2.3 cm with high-grade DCIS with necrosis, margins negative, negative for lymphovascular or perineural invasion, Right lateral lumpectomy: Isolated foci of DCIS high-grade, resection margins negative 4 lymph nodes negative, ER 90%, PR 30%, HER-2 3+ positive,  Ki-67 30%  Treatment plan: 1.Adjuvant chemotherapy with Taxol Herceptin weekly x12 followed by Herceptin maintenance 2.Adjuvant radiation therapy 3.Followed by adjuvant antiestrogen therapy. ----------------------------------------------------------------------------------------------------------------------------------------------------------- Current treatment: Cycle8Taxol Herceptin Echocardiogram 02/04/2020: EF 60 to 65%  Chemo toxicities: 1.  Moderate to severe fatigue: Patient has not been doing a lot of activities at home.  Because of this we will reduce the dosage of Taxol today. 2. Lower extremity edema left leg.   Treated with 2 rounds of antibiotics without any change.  There was no DVT.  I would like to obtain an abdomen and pelvis CT scan to see if there is any blockage that can explain the leg swelling.  In the meantime she will keep it elevated.  She has an appointment with physical therapy to talk about wrapping the leg. 3.  Major depression: Currently on Prozac. 4.  Diarrhea: Due to Herceptin.  I encouraged her to take 2 Imodium's every  morning to stop the diarrhea.  If she takes Imodium she does not get diarrhea. Monitoring her creatinine as well as electrolytes.  She has no symptoms of neuropathy. No signs or symptoms of neuropathy. Return to clinicweekly for chemo and every other week for follow-up with me.    No orders of the defined types were placed in this encounter.  The patient has a good understanding of the overall plan. she agrees with it. she will call with any problems that may develop before the next visit here.  Total time spent: 30 mins including face to face time and time spent for planning, charting and coordination of care  Nicholas Lose, MD 04/08/2020  I, Cloyde Reams Dorshimer, am acting as scribe for Dr. Nicholas Lose.  I have reviewed the above documentation for accuracy and completeness, and I agree with the above.

## 2020-04-08 ENCOUNTER — Inpatient Hospital Stay: Payer: Medicare PPO

## 2020-04-08 ENCOUNTER — Other Ambulatory Visit: Payer: Self-pay

## 2020-04-08 ENCOUNTER — Inpatient Hospital Stay: Payer: Medicare PPO | Admitting: Hematology and Oncology

## 2020-04-08 ENCOUNTER — Encounter: Payer: Self-pay | Admitting: *Deleted

## 2020-04-08 ENCOUNTER — Encounter: Payer: Self-pay | Admitting: General Practice

## 2020-04-08 DIAGNOSIS — Z5112 Encounter for antineoplastic immunotherapy: Secondary | ICD-10-CM | POA: Diagnosis not present

## 2020-04-08 DIAGNOSIS — Z170 Estrogen receptor positive status [ER+]: Secondary | ICD-10-CM | POA: Diagnosis not present

## 2020-04-08 DIAGNOSIS — C50211 Malignant neoplasm of upper-inner quadrant of right female breast: Secondary | ICD-10-CM | POA: Diagnosis not present

## 2020-04-08 LAB — CMP (CANCER CENTER ONLY)
ALT: 33 U/L (ref 0–44)
AST: 27 U/L (ref 15–41)
Albumin: 3.3 g/dL — ABNORMAL LOW (ref 3.5–5.0)
Alkaline Phosphatase: 87 U/L (ref 38–126)
Anion gap: 8 (ref 5–15)
BUN: 9 mg/dL (ref 8–23)
CO2: 26 mmol/L (ref 22–32)
Calcium: 8.7 mg/dL — ABNORMAL LOW (ref 8.9–10.3)
Chloride: 104 mmol/L (ref 98–111)
Creatinine: 0.85 mg/dL (ref 0.44–1.00)
GFR, Est AFR Am: >60 mL/min (ref >60)
GFR, Estimated: >60 mL/min (ref >60)
Glucose, Bld: 123 mg/dL — ABNORMAL HIGH (ref 70–99)
Potassium: 3.8 mmol/L (ref 3.5–5.1)
Sodium: 138 mmol/L (ref 135–145)
Total Bilirubin: 0.4 mg/dL (ref 0.3–1.2)
Total Protein: 6.4 g/dL — ABNORMAL LOW (ref 6.5–8.1)

## 2020-04-08 LAB — CBC WITH DIFFERENTIAL (CANCER CENTER ONLY)
Abs Immature Granulocytes: 0.06 10*3/uL (ref 0.00–0.07)
Basophils Absolute: 0 10*3/uL (ref 0.0–0.1)
Basophils Relative: 0 %
Eosinophils Absolute: 0.1 10*3/uL (ref 0.0–0.5)
Eosinophils Relative: 2 %
HCT: 35.6 % — ABNORMAL LOW (ref 36.0–46.0)
Hemoglobin: 11.4 g/dL — ABNORMAL LOW (ref 12.0–15.0)
Immature Granulocytes: 1 %
Lymphocytes Relative: 19 %
Lymphs Abs: 1.1 10*3/uL (ref 0.7–4.0)
MCH: 32.1 pg (ref 26.0–34.0)
MCHC: 32 g/dL (ref 30.0–36.0)
MCV: 100.3 fL — ABNORMAL HIGH (ref 80.0–100.0)
Monocytes Absolute: 0.4 10*3/uL (ref 0.1–1.0)
Monocytes Relative: 7 %
Neutro Abs: 4 10*3/uL (ref 1.7–7.7)
Neutrophils Relative %: 71 %
Platelet Count: 263 10*3/uL (ref 150–400)
RBC: 3.55 MIL/uL — ABNORMAL LOW (ref 3.87–5.11)
RDW: 16.1 % — ABNORMAL HIGH (ref 11.5–15.5)
WBC Count: 5.7 10*3/uL (ref 4.0–10.5)
nRBC: 0 % (ref 0.0–0.2)

## 2020-04-08 MED ORDER — DIPHENHYDRAMINE HCL 50 MG/ML IJ SOLN
25.0000 mg | Freq: Once | INTRAMUSCULAR | Status: AC
  Administered 2020-04-08: 25 mg via INTRAVENOUS

## 2020-04-08 MED ORDER — SODIUM CHLORIDE 0.9 % IV SOLN
65.0000 mg/m2 | Freq: Once | INTRAVENOUS | Status: AC
  Administered 2020-04-08: 144 mg via INTRAVENOUS
  Filled 2020-04-08: qty 24

## 2020-04-08 MED ORDER — FAMOTIDINE IN NACL 20-0.9 MG/50ML-% IV SOLN
20.0000 mg | Freq: Once | INTRAVENOUS | Status: AC
  Administered 2020-04-08: 20 mg via INTRAVENOUS

## 2020-04-08 MED ORDER — HEPARIN SOD (PORK) LOCK FLUSH 100 UNIT/ML IV SOLN
500.0000 [IU] | Freq: Once | INTRAVENOUS | Status: AC | PRN
  Administered 2020-04-08: 500 [IU]
  Filled 2020-04-08: qty 5

## 2020-04-08 MED ORDER — TRASTUZUMAB-ANNS CHEMO 150 MG IV SOLR
2.0000 mg/kg | Freq: Once | INTRAVENOUS | Status: AC
  Administered 2020-04-08: 210 mg via INTRAVENOUS
  Filled 2020-04-08: qty 10

## 2020-04-08 MED ORDER — SODIUM CHLORIDE 0.9% FLUSH
10.0000 mL | INTRAVENOUS | Status: DC | PRN
  Administered 2020-04-08: 10 mL
  Filled 2020-04-08: qty 10

## 2020-04-08 MED ORDER — SODIUM CHLORIDE 0.9 % IV SOLN
10.0000 mg | Freq: Once | INTRAVENOUS | Status: AC
  Administered 2020-04-08: 10 mg via INTRAVENOUS
  Filled 2020-04-08: qty 10

## 2020-04-08 MED ORDER — SODIUM CHLORIDE 0.9 % IV SOLN
Freq: Once | INTRAVENOUS | Status: AC
  Filled 2020-04-08: qty 250

## 2020-04-08 MED ORDER — ACETAMINOPHEN 325 MG PO TABS
ORAL_TABLET | ORAL | Status: AC
  Filled 2020-04-08: qty 2

## 2020-04-08 MED ORDER — DIPHENHYDRAMINE HCL 50 MG/ML IJ SOLN
INTRAMUSCULAR | Status: AC
  Filled 2020-04-08: qty 1

## 2020-04-08 MED ORDER — ACETAMINOPHEN 325 MG PO TABS
650.0000 mg | ORAL_TABLET | Freq: Once | ORAL | Status: AC
  Administered 2020-04-08: 650 mg via ORAL

## 2020-04-08 MED ORDER — FAMOTIDINE IN NACL 20-0.9 MG/50ML-% IV SOLN
INTRAVENOUS | Status: AC
  Filled 2020-04-08: qty 50

## 2020-04-08 NOTE — Patient Instructions (Signed)
[place] Spiritual Care Note  Followed up with Stanton Kidney in infusion, providing pastoral presence, reflective listening, and encouragement. She is looking forward to ringing the bell after two more treatments and would like to use video chat to include her husband and steadfast supporter in the "ceremonies." This workaround idea brought her some peace and comfort. Plan to make pastoral check-ins at her final two treatments and, if time allows, join her to celebrate her bell-ringing.   [place], [place], [facility] Pager [phone] Voicemail [phone]

## 2020-04-08 NOTE — Assessment & Plan Note (Signed)
01/22/2020: Right medial lumpectomy: Grade 3 IDC 2.3 cm with high-grade DCIS with necrosis, margins negative, negative for lymphovascular or perineural invasion, Right lateral lumpectomy: Isolated foci of DCIS high-grade, resection margins negative 4 lymph nodes negative, ER 90%, PR 30%, HER-2 3+ positive, Ki-67 30%  Treatment plan: 1.Adjuvant chemotherapy with Taxol Herceptin weekly x12 followed by Herceptin maintenance 2.Adjuvant radiation therapy 3.Followed by adjuvant antiestrogen therapy. ----------------------------------------------------------------------------------------------------------------------------------------------------------- Current treatment: Cycle8Taxol Herceptin Echocardiogram 02/04/2020: EF 60 to 65%  Chemo toxicities: 1.  Moderate to severe fatigue: Patient has not been doing a lot of activities at home.  Because of this we will reduce the dosage of Taxol today. 2. Lower extremity edema left leg.    Felt to be cellulitis treated with antibiotics. 3.  Major depression: Currently on Prozac. 4.  Diarrhea: Due to Herceptin.  I encouraged her to take 2 Imodium's every morning to stop the diarrhea. Monitoring her creatinine as well as electrolytes.  No signs or symptoms of neuropathy. Return to clinicweekly for chemo and every other week for follow-up with me.

## 2020-04-08 NOTE — Patient Instructions (Signed)
[place] Discharge Instructions for Patients Receiving Chemotherapy  Today you received the following Immunotherapy agent: Tastuzumab (Kanjinti) and Chemotherapy agent: Paclitaxel (Taxol).  To help prevent nausea and vomiting after your treatment, we encourage you to take your nausea medication as directed by your MD.   If you develop nausea and vomiting that is not controlled by your nausea medication, call the clinic.   BELOW ARE SYMPTOMS THAT SHOULD BE REPORTED IMMEDIATELY:  *FEVER GREATER THAN 100.5 F  *CHILLS WITH OR WITHOUT FEVER  NAUSEA AND VOMITING THAT IS NOT CONTROLLED WITH YOUR NAUSEA MEDICATION  *UNUSUAL SHORTNESS OF BREATH  *UNUSUAL BRUISING OR BLEEDING  TENDERNESS IN MOUTH AND THROAT WITH OR WITHOUT PRESENCE OF ULCERS  *URINARY PROBLEMS  *BOWEL PROBLEMS  UNUSUAL RASH Items with * indicate a potential emergency and should be followed up as soon as possible.  Feel free to call the clinic should you have any questions or concerns. The clinic phone number is [phone].  Please show the [place] at check-in to the Emergency Department and triage nurse.  Coronavirus (COVID-19) Are you at risk?  Are you at risk for the Coronavirus (COVID-19)?  To be considered HIGH RISK for Coronavirus (COVID-19), you have to meet the following criteria:  . Traveled to [place], [place], [place], [place] or [place]; or in the [place] to [place], [place], [place], or [place]; and have fever, cough, and shortness of breath within the last 2 weeks of travel OR . Been in close contact with a person diagnosed with COVID-19 within the last 2 weeks and have fever, cough, and shortness of breath . IF YOU DO NOT MEET THESE CRITERIA, YOU ARE CONSIDERED LOW RISK FOR COVID-19.  What to do if you are HIGH RISK for COVID-19?  Marland Kitchen If you are having a medical emergency, call 911. . Seek medical care right away. Before you go to a doctor's office, urgent  care or emergency department, call ahead and tell them about your recent travel, contact with someone diagnosed with COVID-19, and your symptoms. You should receive instructions from your physician's office regarding next steps of care.  . When you arrive at healthcare provider, tell the healthcare staff immediately you have returned from visiting [place], [place], [place], [place] or [place]; or traveled in the [place] to [place], [place], [place], or [place]; in the last two weeks or you have been in close contact with a person diagnosed with COVID-19 in the last 2 weeks.   . Tell the health care staff about your symptoms: fever, cough and shortness of breath. . After you have been seen by a medical provider, you will be either: o Tested for (COVID-19) and discharged home on quarantine except to seek medical care if symptoms worsen, and asked to  - Stay home and avoid contact with others until you get your results (4-5 days)  - Avoid travel on public transportation if possible (such as bus, train, or airplane) or o Sent to the Emergency Department by EMS for evaluation, COVID-19 testing, and possible admission depending on your condition and test results.  What to do if you are LOW RISK for COVID-19?  Reduce your risk of any infection by using the same precautions used for avoiding the common cold or flu:  Marland Kitchen Wash your hands often with soap and warm water for at least 20 seconds.  If soap and water are not readily available, use an alcohol-based hand sanitizer with at  least 60% alcohol.  . If coughing or sneezing, cover your mouth and nose by coughing or sneezing into the elbow areas of your shirt or coat, into a tissue or into your sleeve (not your hands). . Avoid shaking hands with others and consider head nods or verbal greetings only. . Avoid touching your eyes, nose, or mouth with unwashed hands.  . Avoid close contact with people who are sick. . Avoid places or events with large  numbers of people in one location, like concerts or sporting events. . Carefully consider travel plans you have or are making. . If you are planning any travel outside or inside the [place], visit the CDC's Travelers' Health webpage for the latest health notices. . If you have some symptoms but not all symptoms, continue to monitor at home and seek medical attention if your symptoms worsen. . If you are having a medical emergency, call 911.   [place] / e-Visit: [email or web address]         [facility]: [facility]: [id number]                   [facility]: [phone]

## 2020-04-08 NOTE — Patient Instructions (Signed)

## 2020-04-10 ENCOUNTER — Telehealth: Payer: Self-pay | Admitting: Hematology and Oncology

## 2020-04-10 NOTE — Telephone Encounter (Signed)
Scheduled per 04/20 los, patient has been called and notified.

## 2020-04-15 ENCOUNTER — Inpatient Hospital Stay (HOSPITAL_BASED_OUTPATIENT_CLINIC_OR_DEPARTMENT_OTHER): Payer: Medicare PPO | Admitting: Medical

## 2020-04-15 ENCOUNTER — Other Ambulatory Visit: Payer: Self-pay

## 2020-04-15 ENCOUNTER — Other Ambulatory Visit: Payer: Self-pay | Admitting: Medical

## 2020-04-15 ENCOUNTER — Inpatient Hospital Stay: Payer: Medicare PPO

## 2020-04-15 ENCOUNTER — Encounter: Payer: Self-pay | Admitting: General Practice

## 2020-04-15 ENCOUNTER — Ambulatory Visit (HOSPITAL_BASED_OUTPATIENT_CLINIC_OR_DEPARTMENT_OTHER)
Admission: RE | Admit: 2020-04-15 | Discharge: 2020-04-15 | Disposition: A | Discharge status: Home / Self Care | Payer: Medicare PPO | Source: Ambulatory Visit | Attending: Medical | Admitting: Medical

## 2020-04-15 VITALS — BP 144/71 | HR 89 | Temp 98.2°F | Resp 18 | Wt 219.0 lb

## 2020-04-15 DIAGNOSIS — L03116 Cellulitis of left lower limb: Secondary | ICD-10-CM

## 2020-04-15 DIAGNOSIS — Z170 Estrogen receptor positive status [ER+]: Secondary | ICD-10-CM

## 2020-04-15 DIAGNOSIS — R600 Localized edema: Secondary | ICD-10-CM

## 2020-04-15 DIAGNOSIS — C50211 Malignant neoplasm of upper-inner quadrant of right female breast: Secondary | ICD-10-CM

## 2020-04-15 DIAGNOSIS — L03114 Cellulitis of left upper limb: Secondary | ICD-10-CM

## 2020-04-15 DIAGNOSIS — Z95828 Presence of other vascular implants and grafts: Secondary | ICD-10-CM

## 2020-04-15 LAB — CBC WITH DIFFERENTIAL (CANCER CENTER ONLY)
Abs Immature Granulocytes: 0.07 10*3/uL (ref 0.00–0.07)
Basophils Absolute: 0 10*3/uL (ref 0.0–0.1)
Basophils Relative: 1 %
Eosinophils Absolute: 0.1 10*3/uL (ref 0.0–0.5)
Eosinophils Relative: 2 %
HCT: 35.6 % — ABNORMAL LOW (ref 36.0–46.0)
Hemoglobin: 11.5 g/dL — ABNORMAL LOW (ref 12.0–15.0)
Immature Granulocytes: 1 %
Lymphocytes Relative: 19 %
Lymphs Abs: 1 10*3/uL (ref 0.7–4.0)
MCH: 32.3 pg (ref 26.0–34.0)
MCHC: 32.3 g/dL (ref 30.0–36.0)
MCV: 100 fL (ref 80.0–100.0)
Monocytes Absolute: 0.3 10*3/uL (ref 0.1–1.0)
Monocytes Relative: 6 %
Neutro Abs: 3.8 10*3/uL (ref 1.7–7.7)
Neutrophils Relative %: 71 %
Platelet Count: 254 10*3/uL (ref 150–400)
RBC: 3.56 MIL/uL — ABNORMAL LOW (ref 3.87–5.11)
RDW: 16.2 % — ABNORMAL HIGH (ref 11.5–15.5)
WBC Count: 5.2 10*3/uL (ref 4.0–10.5)
nRBC: 0 % (ref 0.0–0.2)

## 2020-04-15 LAB — CMP (CANCER CENTER ONLY)
ALT: 25 U/L (ref 0–44)
AST: 20 U/L (ref 15–41)
Albumin: 3.3 g/dL — ABNORMAL LOW (ref 3.5–5.0)
Alkaline Phosphatase: 80 U/L (ref 38–126)
Anion gap: 10 (ref 5–15)
BUN: 15 mg/dL (ref 8–23)
CO2: 24 mmol/L (ref 22–32)
Calcium: 8.9 mg/dL (ref 8.9–10.3)
Chloride: 104 mmol/L (ref 98–111)
Creatinine: 0.82 mg/dL (ref 0.44–1.00)
GFR, Est AFR Am: >60 mL/min (ref >60)
GFR, Estimated: >60 mL/min (ref >60)
Glucose, Bld: 134 mg/dL — ABNORMAL HIGH (ref 70–99)
Potassium: 4 mmol/L (ref 3.5–5.1)
Sodium: 138 mmol/L (ref 135–145)
Total Bilirubin: 0.4 mg/dL (ref 0.3–1.2)
Total Protein: 6.3 g/dL — ABNORMAL LOW (ref 6.5–8.1)

## 2020-04-15 MED ORDER — FAMOTIDINE IN NACL 20-0.9 MG/50ML-% IV SOLN
20.0000 mg | Freq: Once | INTRAVENOUS | Status: AC
  Administered 2020-04-15: 20 mg via INTRAVENOUS

## 2020-04-15 MED ORDER — TRASTUZUMAB-ANNS CHEMO 150 MG IV SOLR
2.0000 mg/kg | Freq: Once | INTRAVENOUS | Status: AC
  Administered 2020-04-15: 210 mg via INTRAVENOUS
  Filled 2020-04-15: qty 10

## 2020-04-15 MED ORDER — SODIUM CHLORIDE 0.9% FLUSH
10.0000 mL | INTRAVENOUS | Status: DC | PRN
  Administered 2020-04-15: 10 mL
  Filled 2020-04-15: qty 10

## 2020-04-15 MED ORDER — ACETAMINOPHEN 325 MG PO TABS
ORAL_TABLET | ORAL | Status: AC
  Filled 2020-04-15: qty 2

## 2020-04-15 MED ORDER — HEPARIN SOD (PORK) LOCK FLUSH 100 UNIT/ML IV SOLN
500.0000 [IU] | Freq: Once | INTRAVENOUS | Status: AC | PRN
  Administered 2020-04-15: 500 [IU]
  Filled 2020-04-15: qty 5

## 2020-04-15 MED ORDER — VANCOMYCIN HCL 1000 MG IV SOLR
1000.0000 mg | Freq: Once | INTRAVENOUS | Status: DC
  Filled 2020-04-15: qty 1000

## 2020-04-15 MED ORDER — SODIUM CHLORIDE 0.9% FLUSH
10.0000 mL | INTRAVENOUS | Status: DC | PRN
  Administered 2020-04-15: 10 mL via INTRAVENOUS
  Filled 2020-04-15: qty 10

## 2020-04-15 MED ORDER — DEXTROSE 5 % IV SOLN
2.0000 g | INTRAVENOUS | Status: DC
  Filled 2020-04-15: qty 20

## 2020-04-15 MED ORDER — VANCOMYCIN HCL 1000 MG IV SOLR
1000.0000 mg | Freq: Once | INTRAVENOUS | Status: AC
  Administered 2020-04-15: 1000 mg via INTRAVENOUS
  Filled 2020-04-15: qty 1000

## 2020-04-15 MED ORDER — MORPHINE SULFATE 4 MG/ML IJ SOLN
1.0000 mg | Freq: Once | INTRAMUSCULAR | Status: AC
  Administered 2020-04-15: 11:00:00 1 mg via INTRAVENOUS
  Filled 2020-04-15: qty 1

## 2020-04-15 MED ORDER — DIPHENHYDRAMINE HCL 50 MG/ML IJ SOLN
INTRAMUSCULAR | Status: AC
  Filled 2020-04-15: qty 1

## 2020-04-15 MED ORDER — SODIUM CHLORIDE 0.9 % IV SOLN
10.0000 mg | Freq: Once | INTRAVENOUS | Status: AC
  Administered 2020-04-15: 10 mg via INTRAVENOUS
  Filled 2020-04-15: qty 10

## 2020-04-15 MED ORDER — ACETAMINOPHEN 325 MG PO TABS
650.0000 mg | ORAL_TABLET | Freq: Once | ORAL | Status: AC
  Administered 2020-04-15: 650 mg via ORAL

## 2020-04-15 MED ORDER — FAMOTIDINE IN NACL 20-0.9 MG/50ML-% IV SOLN
INTRAVENOUS | Status: AC
  Filled 2020-04-15: qty 50

## 2020-04-15 MED ORDER — DIPHENHYDRAMINE HCL 50 MG/ML IJ SOLN
25.0000 mg | Freq: Once | INTRAMUSCULAR | Status: AC
  Administered 2020-04-15: 25 mg via INTRAVENOUS

## 2020-04-15 MED ORDER — SODIUM CHLORIDE 0.9 % IV SOLN
Freq: Once | INTRAVENOUS | Status: AC
  Filled 2020-04-15: qty 250

## 2020-04-15 MED ORDER — SODIUM CHLORIDE 0.9 % IV SOLN
65.0000 mg/m2 | Freq: Once | INTRAVENOUS | Status: AC
  Administered 2020-04-15: 144 mg via INTRAVENOUS
  Filled 2020-04-15: qty 24

## 2020-04-15 MED ORDER — MORPHINE SULFATE (PF) 4 MG/ML IV SOLN
INTRAVENOUS | Status: AC
  Filled 2020-04-15: qty 1

## 2020-04-15 MED ORDER — DEXTROSE 5 % IV SOLN
2.0000 g | Freq: Once | INTRAVENOUS | Status: AC
  Administered 2020-04-15: 2 g via INTRAVENOUS
  Filled 2020-04-15: qty 20

## 2020-04-15 NOTE — Progress Notes (Signed)
Left lower extremity venous duplex has been completed. Preliminary results can be found in CV Proc through chart review.  Results were given to Sandi Mealy PA.  04/15/20 3:57 PM [name] RVT

## 2020-04-15 NOTE — Patient Instructions (Signed)

## 2020-04-15 NOTE — Progress Notes (Signed)
Sympt[facility] Management Clinic Progress Note   [name] [id number] [birth year] 69 y.o.  Lynwood Dawley is managed by Dr. Nicholas Lose  Actively treated with chemotherapy/immunotherapy/hormonal therapy: yes  Current therapy: Trastuzumab-anna and paclitaxel  Last treated: 04/08/2020 (cycle 2, day 22)  Next scheduled appointment with provider: 04/22/2020  Assessment: Plan:    Edema of left lower leg - Plan: VAS [place] LOWER EXTREMITY VENOUS (DVT), VAS [place] LOWER EXTREMITY VENOUS (DVT), cefTRIAXone (ROCEPHIN) 1 g in dextrose 5 % 50 mL IVPB, vancomycin (VANCOCIN) 1,000 mg in sodium chloride 0.9 % 250 mL IVPB, cefTRIAXone (ROCEPHIN) 1 g in dextrose 5 % 50 mL IVPB, cefTRIAXone (ROCEPHIN) 1 g in dextrose 5 % 50 mL IVPB, cefTRIAXone (ROCEPHIN) 1 g in dextrose 5 % 50 mL IVPB  Cellulitis of left lower extremity - Plan: cefTRIAXone (ROCEPHIN) 1 g in dextrose 5 % 50 mL IVPB, vancomycin (VANCOCIN) 1,000 mg in sodium chloride 0.9 % 250 mL IVPB, cefTRIAXone (ROCEPHIN) 1 g in dextrose 5 % 50 mL IVPB, cefTRIAXone (ROCEPHIN) 1 g in dextrose 5 % 50 mL IVPB, cefTRIAXone (ROCEPHIN) 1 g in dextrose 5 % 50 mL IVPB  Malignant neoplasm of upper-inner quadrant of right breast in female, estrogen receptor positive (HCC)   Continued edema of the left lower extremity: The patient was referred for a repeat ultrasound of her left lower extremity today.  She has a CT scan of the pelvis scheduled for 11/17/2020 edema secondary to enlarged lymphadenopathy or mass-effect.  She was given 1 mg of morphine IV today.  Cellulitis of the left lower extremity: The patient was given Rocephin 1 g IV and vancomycin 1 g IV today.  She will return on Wednesday, Thursday, and Friday of this week for 1 g of Rocephin each day.  ER positive malignant neoplasm of the right breast: Patient continues to be managed by Dr. Payton Mccallum and is status post cycle 2, day 22 of trastuzumab-anna and paclitaxel.  She is scheduled to be seen in follow-up  on 04/22/2020.   Please see After Visit Summary for patient specific instructions.  Future Appointments  Date Time Provider [place]  04/16/2020 10:30 AM [facility]TOM MANAGEMENT CLINIC 2 [facility] None  04/16/2020 12:30 PM WL-CT 2 WL-CT   04/17/2020 10:30 AM SYMPTOM MANAGEMENT CLINIC 2 [facility] None  04/18/2020 10:30 AM SYMPTOM MANAGEMENT CLINIC 2 [facility] None  04/22/2020  8:30 AM [facility] LAB 1 [facility] None  04/22/2020  8:45 AM [facility] [place] FLUSH [facility] None  04/22/2020  9:15 AM Nicholas Lose, MD [facility] None  04/22/2020 10:15 AM [facility] INFUSION [facility] None  04/29/2020 11:45 AM [facility] LAB 4 [facility] None  04/29/2020 12:00 PM [facility] [place] FLUSH [facility] None  04/29/2020  1:15 PM [facility] INFUSION [facility] None  05/06/2020  8:30 AM [facility] LAB 4 [facility] None  05/06/2020  8:45 AM [facility] [place] FLUSH [facility] None  05/06/2020  9:15 AM Nicholas Lose, MD [facility] None  05/06/2020 10:00 AM [facility] INFUSION [facility] None    Orders Placed This Encounter  Procedures   VAS [place] LOWER EXTREMITY VENOUS (DVT)       Subjective:   Patient ID:  [name] is a 69 y.o. (DOB [birth year]) female.  Chief Complaint: No chief complaint on file.   HPI DERINDA GILLINS  is a 69 y.o. female with a diagnosis of an ER positive malignant neoplasm of the right breast.  She continues to be managed by Dr. Lindi Adie and is status post cycle 2, day 22 of trastuzumab-anna and  paclitaxel.  She was seen in the infusion room today as she was receiving cycle 3, day 1 of therapy.  She has recently been treated with 2 courses of antibiotics for cellulitis of her left lower extremity.  She reports dropping a frozen roast on her foot recently.  She reports having significant pain in her foot today.  She has had a Doppler ultrasound completed which returned negative.  Medications: I have reviewed the patient's current medications.  Allergies:   Allergies  Allergen Reactions   Ambien [Zolpidem Tartrate] Other (See Comments)    Hallucinations and Confusion    Past Medical History:  Diagnosis Date   Anxiety    Cancer ([place])    breast   Chronic pain    Concussion 3/08   ICU x 3 days   Depression    Hypertension    Hypothyroidism    Spinal stenosis    Thyroid disease ?1994    Past Surgical History:  Procedure Laterality Date   BREAST LUMPECTOMY WITH RADIOACTIVE SEED AND SENTINEL LYMPH NODE BIOPSY Right 01/22/2020   Procedure: RIGHT BREAST LUMPECTOMY WITH RADIOACTIVE SEED X2 AND RIGHT SENTINEL LYMPH NODE MAPPING;  Surgeon: Erroll Luna, MD;  Location: [facility];  Service: General;  Laterality: Right;   BREAST SURGERY Right 4/99   breast biopsy, benign   PORTACATH PLACEMENT Right 01/22/2020   Procedure: INSERTION PORT-A-CATH WITH ULTRASOUND;  Surgeon: Erroll Luna, MD;  Location: [facility];  Service: General;  Laterality: Right;   PORTACATH PLACEMENT Right 02/28/2020   Procedure: PORT A CATH REVISION;  Surgeon: Erroll Luna, MD;  Location: [facility];  Service: General;  Laterality: Right;   SPINAL FUSION  03/04/11   with ORIF    Family History  Problem Relation Age of Onset   Thyroid disease Mother    Dementia Mother    Diabetes Father    Stroke Father    Hypertension Sister    Diabetes Sister     Social History   Socioeconomic History   Marital status: Married    Spouse name: Not on file   Number of children: Not on file   Years of education: Not on file   Highest education level: Not on file  Occupational History   Not on file  Tobacco Use   Smoking status: Former Smoker    Types: Cigarettes   Smokeless tobacco: Never Used  Substance and Sexual Activity   Alcohol use: Yes    Alcohol/week: 5.0 standard drinks    Types: 5 Standard drinks or equivalent per week    Comment: wine   Drug use: No   Sexual activity: Never    Partners: Male     Birth control/protection: Post-menopausal  Other Topics Concern   Not on file  Social History Narrative   Not on file   Social Determinants of Health   Financial Resource Strain:    Difficulty of Paying Living Expenses:   Food Insecurity:    Worried About Charity fundraiser in the Last Year:    Arboriculturist in the Last Year:   Transportation Needs:    Film/video editor (Medical):    Lack of Transportation (Non-Medical):   Physical Activity:    Days of Exercise per Week:    Minutes of Exercise per Session:   Stress:    Feeling of Stress :   Social Connections:    Frequency of Communication with Friends and Family:    Frequency of Social Gatherings  with Friends and Family:    Attends Religious Services:    Active Member of Clubs or Organizations:    Attends Music therapist:    Marital Status:   Intimate Partner Violence:    Fear of Current or Ex-Partner:    Emotionally Abused:    Physically Abused:    Sexually Abused:     Past Medical History, Surgical history, Social history, and Family history were reviewed and updated as appropriate.   Please see review of systems for further details on the patient's review from today.   Review of Systems:  Review of Systems  Constitutional: Positive for activity change. Negative for chills, diaphoresis and fever.  Cardiovascular: Positive for leg swelling.  Musculoskeletal: Positive for gait problem.    Objective:   Physical Exam:  LMP  (LMP Unknown)  ECOG: 1  Physical Exam Constitutional:      General: She is not in acute distress.    Appearance: Normal appearance. She is not ill-appearing.  Musculoskeletal:        General: Tenderness (1+ pitting edema of the left lower extremity with tenderness to palpation.) present.     Right lower leg: No edema.     Left lower leg: Edema present.  Neurological:     Mental Status: She is alert.     Lab Review:     Component Value  Date/Time   NA 138 04/15/2020 0849   K 4.0 04/15/2020 0849   CL 104 04/15/2020 0849   CO2 24 04/15/2020 0849   GLUCOSE 134 (H) 04/15/2020 0849   BUN 15 04/15/2020 0849   CREATININE 0.82 04/15/2020 0849   CALCIUM 8.9 04/15/2020 0849   PROT 6.3 (L) 04/15/2020 0849   ALBUMIN 3.3 (L) 04/15/2020 0849   AST 20 04/15/2020 0849   ALT 25 04/15/2020 0849   ALKPHOS 80 04/15/2020 0849   BILITOT 0.4 04/15/2020 0849   GFRNONAA >60 04/15/2020 0849   GFRAA >60 04/15/2020 0849       Component Value Date/Time   WBC 5.2 04/15/2020 0849   WBC 9.2 01/18/2020 0900   RBC 3.56 (L) 04/15/2020 0849   HGB 11.5 (L) 04/15/2020 0849   HGB 12.7 11/13/2013 1506   HCT 35.6 (L) 04/15/2020 0849   PLT 254 04/15/2020 0849   MCV 100.0 04/15/2020 0849   MCH 32.3 04/15/2020 0849   MCHC 32.3 04/15/2020 0849   RDW 16.2 (H) 04/15/2020 0849   LYMPHSABS 1.0 04/15/2020 0849   MONOABS 0.3 04/15/2020 0849   EOSABS 0.1 04/15/2020 0849   BASOSABS 0.0 04/15/2020 0849   -------------------------------  Imaging from last 24 hours (if applicable):  Radiology interpretation: VAS [place] LOWER EXTREMITY VENOUS (DVT)  Result Date: 04/15/2020  Lower Venous DVTStudy Indications: Swelling, and Pain.  Risk Factors: Cancer. Limitations: Poor ultrasound/tissue interface, body habitus and patient positioning. Comparison Study: 03/25/2020 - Negative for DVT Performing Technologist: Oliver Hum RVT  Examination Guidelines: A complete evaluation includes B-mode imaging, spectral Doppler, color Doppler, and power Doppler as needed of all accessible portions of each vessel. Bilateral testing is considered an integral part of a complete examination. Limited examinations for reoccurring indications may be performed as noted. The reflux portion of the exam is performed with the patient in reverse Trendelenburg.  +-----+---------------+---------+-----------+----------+--------------+  RIGHT Compressibility Phasicity Spontaneity Properties Thrombus  Aging  +-----+---------------+---------+-----------+----------+--------------+  CFV   Full            Yes       Yes                                    +-----+---------------+---------+-----------+----------+--------------+   +---------+---------------+---------+-----------+----------+--------------+  LEFT      Compressibility Phasicity Spontaneity Properties Thrombus Aging  +---------+---------------+---------+-----------+----------+--------------+  CFV       Full            Yes       Yes                                    +---------+---------------+---------+-----------+----------+--------------+  SFJ       Full                                                             +---------+---------------+---------+-----------+----------+--------------+  FV Prox   Full                                                             +---------+---------------+---------+-----------+----------+--------------+  FV Mid                    Yes       Yes                                    +---------+---------------+---------+-----------+----------+--------------+  FV Distal                 Yes       Yes                                    +---------+---------------+---------+-----------+----------+--------------+  PFV       Full                                                             +---------+---------------+---------+-----------+----------+--------------+  POP       Full            Yes       Yes                                    +---------+---------------+---------+-----------+----------+--------------+  PTV       Full                                                             +---------+---------------+---------+-----------+----------+--------------+  PERO      Full                                                             +---------+---------------+---------+-----------+----------+--------------+  Summary: RIGHT: - No evidence of common femoral vein obstruction.  LEFT: - There is no evidence of deep vein thrombosis in  the lower extremity. However, portions of this examination were limited- see technologist comments above.  - No cystic structure found in the popliteal fossa.  *See table(s) above for measurements and observations.    Preliminary    VAS [place] LOWER EXTREMITY VENOUS (DVT)  Result Date: 03/25/2020  Lower Venous DVTStudy Indications: Swelling.  Risk Factors: Chemotherapy. Comparison Study: no prior Performing Technologist: June Leap RDMS, RVT  Examination Guidelines: A complete evaluation includes B-mode imaging, spectral Doppler, color Doppler, and power Doppler as needed of all accessible portions of each vessel. Bilateral testing is considered an integral part of a complete examination. Limited examinations for reoccurring indications may be performed as noted. The reflux portion of the exam is performed with the patient in reverse Trendelenburg.  +-----+---------------+---------+-----------+----------+--------------+  RIGHT Compressibility Phasicity Spontaneity Properties Thrombus Aging  +-----+---------------+---------+-----------+----------+--------------+  CFV   Full            Yes       Yes                                    +-----+---------------+---------+-----------+----------+--------------+   +---------+---------------+---------+-----------+----------+--------------+  LEFT      Compressibility Phasicity Spontaneity Properties Thrombus Aging  +---------+---------------+---------+-----------+----------+--------------+  CFV       Full            Yes       Yes                                    +---------+---------------+---------+-----------+----------+--------------+  SFJ       Full                                                             +---------+---------------+---------+-----------+----------+--------------+  FV Prox   Full                                                             +---------+---------------+---------+-----------+----------+--------------+  FV Mid    Full                                                              +---------+---------------+---------+-----------+----------+--------------+  FV Distal Full                                                             +---------+---------------+---------+-----------+----------+--------------+  PFV       Full                                                             +---------+---------------+---------+-----------+----------+--------------+  POP       Full            Yes       Yes                                    +---------+---------------+---------+-----------+----------+--------------+  PTV       Full                                                             +---------+---------------+---------+-----------+----------+--------------+  PERO      Full                                                             +---------+---------------+---------+-----------+----------+--------------+     Summary: RIGHT: - No evidence of common femoral vein obstruction.  LEFT: - There is no evidence of deep vein thrombosis in the lower extremity.  - No cystic structure found in the popliteal fossa.  *See table(s) above for measurements and observations. Electronically signed by Deitra Mayo MD on 03/25/2020 at 3:14:06 PM.    Final         This was discussed with Dr. Nicholas Lose.  He expressed agreement with this plan.

## 2020-04-15 NOTE — Progress Notes (Signed)
[facility] Spiritual Care Note  Followed up with [name] briefly in infusion, but she was too drowsy for much conversation. Plan to follow up by phone later in the week because she seemed down and distraught regarding leg pain and additional chemo treatments.   [place], [place], [facility] Pager [phone] Voicemail [phone]

## 2020-04-15 NOTE — Patient Instructions (Signed)
[facility] Discharge Instructions for Patients Receiving Chemotherapy  Today you received the following Immunotherapy agent: Tastuzumab (Kanjinti) and Chemotherapy agent: Paclitaxel (Taxol).  To help prevent nausea and vomiting after your treatment, we encourage you to take your nausea medication as directed by your MD.   If you develop nausea and vomiting that is not controlled by your nausea medication, call the clinic.   BELOW ARE SYMPTOMS THAT SHOULD BE REPORTED IMMEDIATELY:  *FEVER GREATER THAN 100.5 F  *CHILLS WITH OR WITHOUT FEVER  NAUSEA AND VOMITING THAT IS NOT CONTROLLED WITH YOUR NAUSEA MEDICATION  *UNUSUAL SHORTNESS OF BREATH  *UNUSUAL BRUISING OR BLEEDING  TENDERNESS IN MOUTH AND THROAT WITH OR WITHOUT PRESENCE OF ULCERS  *URINARY PROBLEMS  *BOWEL PROBLEMS  UNUSUAL RASH Items with * indicate a potential emergency and should be followed up as soon as possible.  Feel free to call the clinic should you have any questions or concerns. The clinic phone number is [phone].  Please show the [place] at check-in to the Emergency Department and triage nurse.  Coronavirus (COVID-19) Are you at risk?  Are you at risk for the Coronavirus (COVID-19)?  To be considered HIGH RISK for Coronavirus (COVID-19), you have to meet the following criteria:  . Traveled to [place], [place], [place], [place] or [place]; or in the [place] to [place], [place], [place], or [place]; and have fever, cough, and shortness of breath within the last 2 weeks of travel OR . Been in close contact with a person diagnosed with COVID-19 within the last 2 weeks and have fever, cough, and shortness of breath . IF YOU DO NOT MEET THESE CRITERIA, YOU ARE CONSIDERED LOW RISK FOR COVID-19.  What to do if you are HIGH RISK for COVID-19?  Marland Kitchen If you are having a medical emergency, call 911. . Seek medical care right away. Before you go to a doctor's office, urgent  care or emergency department, call ahead and tell them about your recent travel, contact with someone diagnosed with COVID-19, and your symptoms. You should receive instructions from your physician's office regarding next steps of care.  . When you arrive at healthcare provider, tell the healthcare staff immediately you have returned from visiting [place], [place], [place], [place] or [place]; or traveled in the [place] to [place], [place], [place], or [place]; in the last two weeks or you have been in close contact with a person diagnosed with COVID-19 in the last 2 weeks.   . Tell the health care staff about your symptoms: fever, cough and shortness of breath. . After you have been seen by a medical provider, you will be either: o Tested for (COVID-19) and discharged home on quarantine except to seek medical care if symptoms worsen, and asked to  - Stay home and avoid contact with others until you get your results (4-5 days)  - Avoid travel on public transportation if possible (such as bus, train, or airplane) or o Sent to the Emergency Department by EMS for evaluation, COVID-19 testing, and possible admission depending on your condition and test results.  What to do if you are LOW RISK for COVID-19?  Reduce your risk of any infection by using the same precautions used for avoiding the common cold or flu:  Marland Kitchen Wash your hands often with soap and warm water for at least 20 seconds.  If soap and water are not readily available, use an alcohol-based hand sanitizer with at  least 60% alcohol.  . If coughing or sneezing, cover your mouth and nose by coughing or sneezing into the elbow areas of your shirt or coat, into a tissue or into your sleeve (not your hands). . Avoid shaking hands with others and consider head nods or verbal greetings only. . Avoid touching your eyes, nose, or mouth with unwashed hands.  . Avoid close contact with people who are sick. . Avoid places or events with large  numbers of people in one location, like concerts or sporting events. . Carefully consider travel plans you have or are making. . If you are planning any travel outside or inside the [place], visit the CDC's Travelers' Health webpage for the latest health notices. . If you have some symptoms but not all symptoms, continue to monitor at home and seek medical attention if your symptoms worsen. . If you are having a medical emergency, call 911.   [place] / e-Visit: [email or web address]         [facility]: [facility]: [id number]                   [facility]: [phone]

## 2020-04-15 NOTE — Progress Notes (Signed)
Received in person report from [place], pt transported via w/c with belongings from infusion suite to [facility] room 26 to finish abx.  Pt tolerated Vancomyin and Rocephin well, able to drink/eat/use restroom during tx w/out any issues.  Pt transported to [place] dept with belongings via w/c.  Pt aware to return on 04/16/20 at 1030 for IV abx again and to drink oral contrast before CT scan.

## 2020-04-16 ENCOUNTER — Encounter (HOSPITAL_COMMUNITY): Payer: Self-pay

## 2020-04-16 ENCOUNTER — Inpatient Hospital Stay: Payer: Medicare PPO

## 2020-04-16 ENCOUNTER — Inpatient Hospital Stay (HOSPITAL_COMMUNITY)
Admission: RE | Admit: 2020-04-16 | Discharge: 2020-04-16 | Disposition: A | Discharge status: Home / Self Care | Payer: Medicare PPO | Source: Ambulatory Visit | Attending: Hematology and Oncology | Admitting: Hematology and Oncology

## 2020-04-16 ENCOUNTER — Telehealth: Payer: Self-pay | Admitting: Emergency Medicine

## 2020-04-16 VITALS — BP 154/83 | HR 91 | Temp 98.6°F | Resp 17 | Ht 67.0 in | Wt 252.6 lb

## 2020-04-16 DIAGNOSIS — L03116 Cellulitis of left lower limb: Secondary | ICD-10-CM

## 2020-04-16 DIAGNOSIS — R600 Localized edema: Secondary | ICD-10-CM

## 2020-04-16 DIAGNOSIS — Z170 Estrogen receptor positive status [ER+]: Secondary | ICD-10-CM | POA: Insufficient documentation

## 2020-04-16 DIAGNOSIS — Z95828 Presence of other vascular implants and grafts: Secondary | ICD-10-CM

## 2020-04-16 DIAGNOSIS — C50211 Malignant neoplasm of upper-inner quadrant of right female breast: Secondary | ICD-10-CM | POA: Insufficient documentation

## 2020-04-16 MED ORDER — DEXTROSE 5 % IV SOLN
1.0000 g | Freq: Once | INTRAVENOUS | Status: AC
  Administered 2020-04-16: 1 g via INTRAVENOUS
  Filled 2020-04-16: qty 10

## 2020-04-16 MED ORDER — HEPARIN SOD (PORK) LOCK FLUSH 100 UNIT/ML IV SOLN
500.0000 [IU] | Freq: Once | INTRAVENOUS | Status: AC
  Administered 2020-04-16: 500 [IU] via INTRAVENOUS
  Filled 2020-04-16: qty 5

## 2020-04-16 MED ORDER — SODIUM CHLORIDE (PF) 0.9 % IJ SOLN
INTRAMUSCULAR | Status: AC
  Filled 2020-04-16: qty 50

## 2020-04-16 MED ORDER — SODIUM CHLORIDE 0.9% FLUSH
10.0000 mL | Freq: Once | INTRAVENOUS | Status: AC
  Administered 2020-04-16: 10 mL
  Filled 2020-04-16: qty 10

## 2020-04-16 MED ORDER — SODIUM CHLORIDE 0.9 % IV SOLN
Freq: Once | INTRAVENOUS | Status: AC
  Filled 2020-04-16: qty 250

## 2020-04-16 MED ORDER — IOHEXOL 300 MG/ML  SOLN
100.0000 mL | Freq: Once | INTRAMUSCULAR | Status: AC | PRN
  Administered 2020-04-16: 100 mL via INTRAVENOUS

## 2020-04-16 NOTE — Progress Notes (Signed)
Spoke with Tedra Coupe in CT, pt will be about 15 minutes late to CT scan.  Pt states she has remained NPO as instructed for CT scan besides drinking two oral contrasts.  Pt received 2nd dose of IV rocephin today, tolerated well.  Transported via w/c by NT with belongings to CT scan.

## 2020-04-16 NOTE — Progress Notes (Signed)
Pharmacist Chemotherapy Monitoring - Follow Up Assessment    I verify that I have reviewed each item in the below checklist:  . Regimen for the patient is scheduled for the appropriate day and plan matches scheduled date. Marland Kitchen Appropriate non-routine labs are ordered dependent on drug ordered. . If applicable, additional medications reviewed and ordered per protocol based on lifetime cumulative doses and/or treatment regimen.   Plan for follow-up and/or issues identified: No . I-vent associated with next due treatment: No . MD and/or nursing notified: No  Teosha Casso K 04/16/2020 3:40 PM

## 2020-04-16 NOTE — Telephone Encounter (Signed)
Called pt regarding [facility] appt today, no answer.  Left VM requesting pt to call back and discuss appts for today/rescheduling.

## 2020-04-16 NOTE — Patient Instructions (Signed)

## 2020-04-16 NOTE — Addendum Note (Signed)
Addended by: Harle Stanford on: 04/16/2020 10:21 AM   Modules accepted: Miquel Dunn

## 2020-04-17 ENCOUNTER — Inpatient Hospital Stay (HOSPITAL_COMMUNITY)
Admission: AD | Admit: 2020-04-17 | Discharge: 2020-04-24 | DRG: 603 | Disposition: A | Discharge status: Home / Self Care | Payer: Medicare PPO | Attending: Internal Medicine | Admitting: Internal Medicine

## 2020-04-17 ENCOUNTER — Other Ambulatory Visit: Payer: Self-pay

## 2020-04-17 ENCOUNTER — Inpatient Hospital Stay (HOSPITAL_BASED_OUTPATIENT_CLINIC_OR_DEPARTMENT_OTHER): Payer: Medicare PPO | Admitting: Medical

## 2020-04-17 ENCOUNTER — Inpatient Hospital Stay (HOSPITAL_COMMUNITY): Payer: Medicare PPO

## 2020-04-17 ENCOUNTER — Encounter: Payer: Self-pay | Admitting: General Practice

## 2020-04-17 ENCOUNTER — Other Ambulatory Visit: Payer: Self-pay | Admitting: Medical

## 2020-04-17 DIAGNOSIS — F419 Anxiety disorder, unspecified: Secondary | ICD-10-CM

## 2020-04-17 DIAGNOSIS — T451X5A Adverse effect of antineoplastic and immunosuppressive drugs, initial encounter: Secondary | ICD-10-CM | POA: Diagnosis present

## 2020-04-17 DIAGNOSIS — Z8349 Family history of other endocrine, nutritional and metabolic diseases: Secondary | ICD-10-CM

## 2020-04-17 DIAGNOSIS — Z833 Family history of diabetes mellitus: Secondary | ICD-10-CM | POA: Diagnosis not present

## 2020-04-17 DIAGNOSIS — Z66 Do not resuscitate: Secondary | ICD-10-CM | POA: Diagnosis present

## 2020-04-17 DIAGNOSIS — Z87891 Personal history of nicotine dependence: Secondary | ICD-10-CM | POA: Diagnosis not present

## 2020-04-17 DIAGNOSIS — L03116 Cellulitis of left lower limb: Principal | ICD-10-CM | POA: Diagnosis present

## 2020-04-17 DIAGNOSIS — Z170 Estrogen receptor positive status [ER+]: Secondary | ICD-10-CM

## 2020-04-17 DIAGNOSIS — Z823 Family history of stroke: Secondary | ICD-10-CM

## 2020-04-17 DIAGNOSIS — I10 Essential (primary) hypertension: Secondary | ICD-10-CM | POA: Diagnosis present

## 2020-04-17 DIAGNOSIS — D6481 Anemia due to antineoplastic chemotherapy: Secondary | ICD-10-CM | POA: Diagnosis present

## 2020-04-17 DIAGNOSIS — Z1501 Genetic susceptibility to malignant neoplasm of breast: Secondary | ICD-10-CM | POA: Diagnosis not present

## 2020-04-17 DIAGNOSIS — Z79899 Other long term (current) drug therapy: Secondary | ICD-10-CM | POA: Diagnosis not present

## 2020-04-17 DIAGNOSIS — D539 Nutritional anemia, unspecified: Secondary | ICD-10-CM | POA: Diagnosis present

## 2020-04-17 DIAGNOSIS — E876 Hypokalemia: Secondary | ICD-10-CM | POA: Diagnosis present

## 2020-04-17 DIAGNOSIS — F329 Major depressive disorder, single episode, unspecified: Secondary | ICD-10-CM | POA: Diagnosis present

## 2020-04-17 DIAGNOSIS — R600 Localized edema: Secondary | ICD-10-CM

## 2020-04-17 DIAGNOSIS — C50211 Malignant neoplasm of upper-inner quadrant of right female breast: Secondary | ICD-10-CM

## 2020-04-17 DIAGNOSIS — Z79891 Long term (current) use of opiate analgesic: Secondary | ICD-10-CM

## 2020-04-17 DIAGNOSIS — Z20822 Contact with and (suspected) exposure to covid-19: Secondary | ICD-10-CM | POA: Diagnosis present

## 2020-04-17 DIAGNOSIS — F32A Depression, unspecified: Secondary | ICD-10-CM

## 2020-04-17 DIAGNOSIS — K529 Noninfective gastroenteritis and colitis, unspecified: Secondary | ICD-10-CM

## 2020-04-17 DIAGNOSIS — K521 Toxic gastroenteritis and colitis: Secondary | ICD-10-CM | POA: Diagnosis present

## 2020-04-17 DIAGNOSIS — Z8249 Family history of ischemic heart disease and other diseases of the circulatory system: Secondary | ICD-10-CM

## 2020-04-17 DIAGNOSIS — E039 Hypothyroidism, unspecified: Secondary | ICD-10-CM | POA: Diagnosis present

## 2020-04-17 DIAGNOSIS — F418 Other specified anxiety disorders: Secondary | ICD-10-CM | POA: Diagnosis not present

## 2020-04-17 LAB — CBC
HCT: 29.5 % — ABNORMAL LOW (ref 36.0–46.0)
Hemoglobin: 9.3 g/dL — ABNORMAL LOW (ref 12.0–15.0)
MCH: 32.4 pg (ref 26.0–34.0)
MCHC: 31.5 g/dL (ref 30.0–36.0)
MCV: 102.8 fL — ABNORMAL HIGH (ref 80.0–100.0)
Platelets: 215 10*3/uL (ref 150–400)
RBC: 2.87 MIL/uL — ABNORMAL LOW (ref 3.87–5.11)
RDW: 16.7 % — ABNORMAL HIGH (ref 11.5–15.5)
WBC: 4.1 10*3/uL (ref 4.0–10.5)
nRBC: 0 % (ref 0.0–0.2)

## 2020-04-17 LAB — COMPREHENSIVE METABOLIC PANEL
ALT: 22 U/L (ref 0–44)
AST: 23 U/L (ref 15–41)
Albumin: 3 g/dL — ABNORMAL LOW (ref 3.5–5.0)
Alkaline Phosphatase: 51 U/L (ref 38–126)
Anion gap: 8 (ref 5–15)
BUN: 20 mg/dL (ref 8–23)
CO2: 24 mmol/L (ref 22–32)
Calcium: 7 mg/dL — ABNORMAL LOW (ref 8.9–10.3)
Chloride: 112 mmol/L — ABNORMAL HIGH (ref 98–111)
Creatinine, Ser: 0.51 mg/dL (ref 0.44–1.00)
GFR calc Af Amer: >60 mL/min (ref >60)
GFR calc non Af Amer: >60 mL/min (ref >60)
Glucose, Bld: 89 mg/dL (ref 70–99)
Potassium: 2.8 mmol/L — ABNORMAL LOW (ref 3.5–5.1)
Sodium: 144 mmol/L (ref 135–145)
Total Bilirubin: 0.5 mg/dL (ref 0.3–1.2)
Total Protein: 5.4 g/dL — ABNORMAL LOW (ref 6.5–8.1)

## 2020-04-17 LAB — HIV ANTIBODY (ROUTINE TESTING W REFLEX): HIV Screen 4th Generation wRfx: NONREACTIVE

## 2020-04-17 LAB — MAGNESIUM: Magnesium: 1.7 mg/dL (ref 1.7–2.4)

## 2020-04-17 LAB — TSH: TSH: 30.41 u[IU]/mL — ABNORMAL HIGH (ref 0.350–4.500)

## 2020-04-17 MED ORDER — SODIUM CHLORIDE 0.9 % IV SOLN
Freq: Once | INTRAVENOUS | Status: AC
  Filled 2020-04-17: qty 250

## 2020-04-17 MED ORDER — FLUOXETINE HCL 20 MG PO CAPS
80.0000 mg | ORAL_CAPSULE | Freq: Every day | ORAL | Status: DC
  Administered 2020-04-17 – 2020-04-23 (×7): 80 mg via ORAL
  Filled 2020-04-17 (×7): qty 4

## 2020-04-17 MED ORDER — MORPHINE SULFATE 4 MG/ML IJ SOLN
1.0000 mg | Freq: Once | INTRAMUSCULAR | Status: DC
  Filled 2020-04-17: qty 1

## 2020-04-17 MED ORDER — POTASSIUM CHLORIDE 20 MEQ PO PACK
40.0000 meq | PACK | ORAL | Status: AC
  Administered 2020-04-17 (×2): 40 meq via ORAL
  Filled 2020-04-17 (×2): qty 2

## 2020-04-17 MED ORDER — ACETAMINOPHEN 325 MG PO TABS: 650.0000 mg | ORAL_TABLET | Freq: Four times a day (QID) | ORAL | Status: DC | PRN

## 2020-04-17 MED ORDER — DEXTROSE 5 % IV SOLN
1.0000 g | Freq: Once | INTRAVENOUS | Status: AC
  Administered 2020-04-17: 1 g via INTRAVENOUS
  Filled 2020-04-17: qty 10

## 2020-04-17 MED ORDER — OXYCODONE-ACETAMINOPHEN 5-325 MG PO TABS
2.0000 | ORAL_TABLET | Freq: Once | ORAL | Status: AC
  Administered 2020-04-17: 2 via ORAL

## 2020-04-17 MED ORDER — ONDANSETRON HCL 4 MG/2ML IJ SOLN: 4.0000 mg | Freq: Four times a day (QID) | INTRAMUSCULAR | Status: DC | PRN

## 2020-04-17 MED ORDER — OXYCODONE-ACETAMINOPHEN 5-325 MG PO TABS
1.0000 | ORAL_TABLET | Freq: Four times a day (QID) | ORAL | Status: DC | PRN
  Administered 2020-04-17 – 2020-04-22 (×16): 2 via ORAL
  Filled 2020-04-17 (×16): qty 2

## 2020-04-17 MED ORDER — VANCOMYCIN HCL 1000 MG IV SOLR
1000.0000 mg | Freq: Once | INTRAVENOUS | Status: DC
  Filled 2020-04-17: qty 1000

## 2020-04-17 MED ORDER — HEPARIN SODIUM (PORCINE) 5000 UNIT/ML IJ SOLN
5000.0000 [IU] | Freq: Three times a day (TID) | INTRAMUSCULAR | Status: DC
  Administered 2020-04-17 – 2020-04-24 (×19): 5000 [IU] via SUBCUTANEOUS
  Filled 2020-04-17 (×20): qty 1

## 2020-04-17 MED ORDER — SODIUM CHLORIDE 0.9 % IV SOLN
1.0000 g | Freq: Once | INTRAVENOUS | Status: AC
  Administered 2020-04-17: 1 g via INTRAVENOUS
  Filled 2020-04-17: qty 1

## 2020-04-17 MED ORDER — ACETAMINOPHEN 650 MG RE SUPP: 650.0000 mg | Freq: Four times a day (QID) | RECTAL | Status: DC | PRN

## 2020-04-17 MED ORDER — AMITRIPTYLINE HCL 25 MG PO TABS
75.0000 mg | ORAL_TABLET | Freq: Every day | ORAL | Status: DC
  Administered 2020-04-17 – 2020-04-23 (×7): 75 mg via ORAL
  Filled 2020-04-17 (×7): qty 3

## 2020-04-17 MED ORDER — SODIUM CHLORIDE 0.9 % IV SOLN
2.0000 g | INTRAVENOUS | Status: DC
  Administered 2020-04-18 – 2020-04-21 (×4): 2 g via INTRAVENOUS
  Filled 2020-04-17 (×4): qty 2

## 2020-04-17 MED ORDER — DIAZEPAM 5 MG PO TABS
10.0000 mg | ORAL_TABLET | Freq: Every evening | ORAL | Status: DC | PRN
  Administered 2020-04-17: 10 mg via ORAL
  Filled 2020-04-17: qty 2

## 2020-04-17 MED ORDER — HYDRALAZINE HCL 20 MG/ML IJ SOLN: 5.0000 mg | Freq: Four times a day (QID) | INTRAMUSCULAR | Status: DC | PRN

## 2020-04-17 MED ORDER — POLYETHYLENE GLYCOL 3350 17 G PO PACK: 17.0000 g | PACK | Freq: Every day | ORAL | Status: DC | PRN

## 2020-04-17 MED ORDER — ONDANSETRON HCL 4 MG PO TABS: 4.0000 mg | ORAL_TABLET | Freq: Four times a day (QID) | ORAL | Status: DC | PRN

## 2020-04-17 MED ORDER — MORPHINE SULFATE (PF) 4 MG/ML IV SOLN
INTRAVENOUS | Status: AC
  Filled 2020-04-17: qty 1

## 2020-04-17 MED ORDER — DOCUSATE SODIUM 100 MG PO CAPS
100.0000 mg | ORAL_CAPSULE | Freq: Two times a day (BID) | ORAL | Status: DC
  Administered 2020-04-17 – 2020-04-21 (×6): 100 mg via ORAL
  Filled 2020-04-17 (×12): qty 1

## 2020-04-17 MED ORDER — OXYCODONE-ACETAMINOPHEN 5-325 MG PO TABS
ORAL_TABLET | ORAL | Status: AC
  Filled 2020-04-17: qty 2

## 2020-04-17 MED ORDER — VANCOMYCIN HCL 1000 MG IV SOLR
1000.0000 mg | Freq: Once | INTRAVENOUS | Status: AC
  Administered 2020-04-17: 1000 mg via INTRAVENOUS
  Filled 2020-04-17: qty 1000

## 2020-04-17 MED ORDER — LEVOTHYROXINE SODIUM 25 MCG PO TABS
137.0000 ug | ORAL_TABLET | Freq: Every day | ORAL | Status: DC
  Administered 2020-04-17 – 2020-04-23 (×7): 137 ug via ORAL
  Filled 2020-04-17 (×7): qty 1

## 2020-04-17 MED ORDER — VANCOMYCIN HCL 1500 MG/300ML IV SOLN
1500.0000 mg | INTRAVENOUS | Status: DC
  Administered 2020-04-17 – 2020-04-20 (×4): 1500 mg via INTRAVENOUS
  Filled 2020-04-17 (×5): qty 300

## 2020-04-17 MED ORDER — MORPHINE SULFATE 4 MG/ML IJ SOLN
1.0000 mg | Freq: Once | INTRAMUSCULAR | Status: AC
  Administered 2020-04-17: 1 mg via INTRAVENOUS
  Filled 2020-04-17: qty 1

## 2020-04-17 NOTE — Progress Notes (Signed)
Pt received IV rocephin & IV vancomycin, tolerated well.  Able to eat/drink and ambulate to restroom during infusion w/out any issues.  Pt being admitted d/t worsening cellulitis, pt has made family aware.  VSS.  PA Lucianne Lei to discuss results of recent CT scan w/patient per MD Gudena.  Pt transported via w/c with belongings to [id number], report given by phone to [facility].

## 2020-04-17 NOTE — Progress Notes (Signed)
[place] Spiritual Care Note  Reached Ms Dehart by phone for pastoral check-in. She reports feeling more active distress about her leg pain than about her cancer at the moment and is hopeful that her 10:30 appointment might lead to a better understanding of her injury and a plan of care for relief. No other concerns at this time.    [place], [place], [facility] Pager [phone] Voicemail [phone]

## 2020-04-17 NOTE — Patient Instructions (Signed)

## 2020-04-17 NOTE — Progress Notes (Signed)
Pharmacy Antibiotic Note  [name] is a 69 y.o. female admitted on 04/17/2020 with cellulitis.  Pharmacy has been consulted for Vancomycin dosing.  Patient received [facility]X at [facility] 4/27 & 28, Vancomycin 1gm 4/27 & 29   Plan: Vancomycin 1500mg  q24, will give dose early, since desired loading dose would be Vanc 2250mg   Using SCr 0.82, AUC 466, Vd 0.5 Rocephin to 2gm q24  Weight: 114.6 kg (252 lb 11.2 oz)  Temp (24hrs), Avg:98.4 F (36.9 C), Min:98.3 F (36.8 C), Max:98.5 F (36.9 C)  Recent Labs  Lab 04/15/20 0849  WBC 5.2  CREATININE 0.82    Estimated Creatinine Clearance: 83.4 mL/min (by C-G formula based on SCr of 0.82 mg/dL).    Allergies  Allergen Reactions  . Ambien [Zolpidem Tartrate] Other (See Comments)    Hallucinations and Confusion    Antimicrobials this admission: 4/27 CTX >>  4/27 Vanc x1, resumed 4/29   Dose adjustments this admission:  Microbiology results: 4/29 BCx: sent HIV: ordered  Thank you for allowing pharmacy to be a part of this patient's care.  Minda Ditto PharmD 04/17/2020 6:30 PM

## 2020-04-17 NOTE — Progress Notes (Signed)
[facility] Progress Note   KAIDEE SCHIEFERSTEIN [id number] 1951-07-13 69 y.o.  [name] is managed by Dr. Nicholas Lose  Actively treated with chemotherapy/immunotherapy/hormonal therapy: yes  Current therapy: Trastuzumab-anna and paclitaxel  Last treated: 04/08/2020 (cycle 2, day 22)  Next scheduled appointment with provider: 04/22/2020  Assessment: Plan:    Edema of left lower leg - Plan: cefTRIAXone (ROCEPHIN) 1 g in dextrose 5 % 50 mL IVPB, oxyCODONE-acetaminophen (PERCOCET/ROXICET) 5-325 MG per tablet 2 tablet  Cellulitis of left lower extremity - Plan: cefTRIAXone (ROCEPHIN) 1 g in dextrose 5 % 50 mL IVPB, 0.9 %  sodium chloride infusion, morphine 4 MG/ML injection 1 mg, vancomycin (VANCOCIN) 1,000 mg in sodium chloride 0.9 % 250 mL IVPB, oxyCODONE-acetaminophen (PERCOCET/ROXICET) 5-325 MG per tablet 2 tablet   Continued edema of the left lower extremity: [name] was referred for a repeat ultrasound of her left lower extremity on 04/15/2020 which returned negative for as DVT.  She had a CT scan of the abdomen and pelvis on 04/16/2020 with results as noted in HPI.  Cellulitis of the left lower extremity: [name] was given Rocephin 1 g IV and vancomycin 1 g IV today.  This is her 3rd dose of Rocephin and 2nd dose of vancomycin this week which has followed 2 recent week long doses of PO antibiotics, initially Keflex then Bactrim. Despite this, the patient's left lower extremity edema, erythema, warmth, and pain have progressed. Dr. Louanne Belton has graciously agreed to accept this patient as a direct admission to [facility] for management. She was given morphine sulfate 1 mg IV x 1 and Percocet 5-325 mg 2 tablets PO x 1 today.  ER positive malignant neoplasm of the right breast: [name] continues to be managed by Dr. Payton Mccallum and is status post cycle 2, day 22 of trastuzumab-anna and paclitaxel.  She is scheduled to be seen in follow-up on  04/22/2020.   Please see After Visit Summary for patient specific instructions.  Future Appointments  Date Time Provider [place]  04/22/2020  8:30 AM [facility] LAB 1 [facility] None  04/22/2020  8:45 AM [facility] [place] FLUSH [facility] None  04/22/2020  9:15 AM Nicholas Lose, MD [facility] None  04/22/2020 10:15 AM [facility] INFUSION [facility] None  04/22/2020 12:00 PM Jennet Maduro, RD [facility] None  04/29/2020 11:45 AM [facility] LAB 4 [facility] None  04/29/2020 12:00 PM [facility] [place] FLUSH [facility] None  04/29/2020  1:15 PM [facility] INFUSION [facility] None  05/06/2020  8:30 AM [facility] LAB 4 [facility] None  05/06/2020  8:45 AM [facility] [place] FLUSH [facility] None  05/06/2020  9:15 AM Nicholas Lose, MD [facility] None  05/06/2020 10:00 AM [facility] INFUSION [facility] None    No orders of the defined types were placed in this encounter.      Subjective:   Patient ID:  [name] is a 69 y.o. (DOB 10/04/1951) female.  Chief Complaint:  Chief Complaint  Patient presents with  . Follow-up    Cellulitis, IV Antibiotic infusion    HPI [name]  is a 69 y.o. female with a diagnosis of an ER positive malignant neoplasm of the right breast.  She continues to be managed by Dr. Lindi Adie and is status post cycle 2, day 22 of trastuzumab-anna and paclitaxel.  [name] was given Rocephin 1 g IV and vancomycin 1 g IV today.  This is her 3rd dose of Rocephin and 2nd dose of vancomycin this week which has followed 2 recent  week long doses of PO antibiotics, initially Keflex then Bactrim. Despite this, the patient's left lower extremity edema, erythema, warmth, and pain have progressed. [name] was referred for a repeat ultrasound of her left lower extremity on 04/15/2020 which returned negative for as DVT.  She had a CT scan of the abdomen and pelvis on 04/16/2020 with results as follows:  FINDINGS: Lower Chest: No acute findings.  Hepatobiliary:  Mild diffuse hepatic steatosis.  Subcentimeter low-attenuation lesion in the anterior right lobe has nonspecific characteristics and is difficult to characterize due to its small size.  No other liver lesions identified.  At least 1 gallstone is seen measuring 2 cm, however there is no evidence of cholecystitis or biliary ductal dilatation.  Pancreas:  No mass or inflammatory changes.  Spleen: Within normal limits in size and appearance.  Adrenals/Urinary Tract: 2 cm homogeneous left adrenal mass is seen which has nonspecific characteristics.  No renal masses identified. No evidence of ureteral calculi or hydronephrosis.  Unremarkable unopacified urinary bladder.  Stomach/Bowel: No evidence of obstruction, inflammatory process or abnormal fluid collections.  Normal appendix visualized. Diverticulosis is seen mainly involving the sigmoid colon, however there is no evidence of diverticulitis.  Vascular/Lymphatic: No pathologically enlarged lymph nodes.  No abdominal aortic aneurysm. No evidence of IVC iliac vein thrombosis.  Reproductive: Fibroid seen in the right uterine fundus measuring 4.3 cm.  Adnexal regions are unremarkable.  Other:  None.  Musculoskeletal: No suspicious bone lesions identified.  Lumbar spine fusion hardware noted at L4-5.  IMPRESSION: 1. 2 cm indeterminate left adrenal mass, and sub-cm indeterminate liver lesion. Abdomen MRI without and with contrast is recommended for further characterization. 2. Mild hepatic steatosis. 3. Cholelithiasis. No radiographic evidence of cholecystitis. 4. Colonic diverticulosis, without radiographic evidence of diverticulitis. 5. 4.3 cm uterine fibroid.  Plans are to order an abdominal MRI as recommended above if not during this admission then on her discharge.  Medications: I have reviewed the patient's current medications.  Allergies:  Allergies  Allergen Reactions  . Ambien [Zolpidem Tartrate] Other (See  Comments)    Hallucinations and Confusion    Past Medical History:  Diagnosis Date  . Anxiety   . Cancer (HCC)    breast  . Chronic pain   . Concussion 3/08   ICU x 3 days  . Depression   . Hypertension   . Hypothyroidism   . Spinal stenosis   . Thyroid disease ?1994    Past Surgical History:  Procedure Laterality Date  . BREAST LUMPECTOMY WITH RADIOACTIVE SEED AND SENTINEL LYMPH NODE BIOPSY Right 01/22/2020   Procedure: RIGHT BREAST LUMPECTOMY WITH RADIOACTIVE SEED X2 AND RIGHT SENTINEL LYMPH NODE MAPPING;  Surgeon: Erroll Luna, MD;  Location: [place];  Service: General;  Laterality: Right;  . BREAST SURGERY Right 4/99   breast biopsy, benign  . PORTACATH PLACEMENT Right 01/22/2020   Procedure: INSERTION PORT-A-CATH WITH ULTRASOUND;  Surgeon: Erroll Luna, MD;  Location: [place];  Service: General;  Laterality: Right;  . PORTACATH PLACEMENT Right 02/28/2020   Procedure: PORT A CATH REVISION;  Surgeon: Erroll Luna, MD;  Location: [place];  Service: General;  Laterality: Right;  . SPINAL FUSION  03/04/11   with ORIF    Family History  Problem Relation Age of Onset  . Thyroid disease Mother   . Dementia Mother   . Diabetes Father   . Stroke Father   . Hypertension Sister   . Diabetes Sister     Social  History   Socioeconomic History  . Marital status: Married    Spouse name: Not on file  . Number of children: Not on file  . Years of education: Not on file  . Highest education level: Not on file  Occupational History  . Not on file  Tobacco Use  . Smoking status: Former Smoker    Types: Cigarettes  . Smokeless tobacco: Never Used  Substance and Sexual Activity  . Alcohol use: Yes    Alcohol/week: 5.0 standard drinks    Types: 5 Standard drinks or equivalent per week    Comment: wine  . Drug use: No  . Sexual activity: Never    Partners: Male    Birth control/protection: Post-menopausal  Other Topics Concern  . Not  on file  Social History Narrative  . Not on file   Social Determinants of Health   Financial Resource Strain:   . Difficulty of Paying Living Expenses:   Food Insecurity:   . Worried About Charity fundraiser in the Last Year:   . Arboriculturist in the Last Year:   Transportation Needs:   . Film/video editor (Medical):   Marland Kitchen Lack of Transportation (Non-Medical):   Physical Activity:   . Days of Exercise per Week:   . Minutes of Exercise per Session:   Stress:   . Feeling of Stress :   Social Connections:   . Frequency of Communication with Friends and Family:   . Frequency of Social Gatherings with Friends and Family:   . Attends Religious Services:   . Active Member of Clubs or Organizations:   . Attends Archivist Meetings:   Marland Kitchen Marital Status:   Intimate Partner Violence:   . Fear of Current or Ex-Partner:   . Emotionally Abused:   Marland Kitchen Physically Abused:   . Sexually Abused:     Past Medical History, Surgical history, Social history, and Family history were reviewed and updated as appropriate.   Please see review of systems for further details on the patient's review from today.   Review of Systems:  Review of Systems  Constitutional: Positive for activity change. Negative for chills, diaphoresis and fever.  Cardiovascular: Positive for leg swelling.  Musculoskeletal: Positive for arthralgias, gait problem and myalgias.       Left lower extremity pain.    Objective:   Physical Exam:  BP (!) 157/84 (BP Location: Right Wrist, Patient Position: Sitting)   Pulse 80   Temp 98.3 F (36.8 C) (Oral)   Resp 18   Ht 5\' 7"  (1.702 m)   LMP  (LMP Unknown)   SpO2 97% Comment: room air  BMI 39.56 kg/m  ECOG: 1  Physical Exam Constitutional:      General: She is not in acute distress.    Appearance: Normal appearance. She is not ill-appearing, toxic-appearing or diaphoretic.  Eyes:     General: No scleral icterus.       Right eye: No discharge.         Left eye: No discharge.     Conjunctiva/sclera: Conjunctivae normal.  Cardiovascular:     Rate and Rhythm: Normal rate and regular rhythm.     Heart sounds: Normal heart sounds. No murmur. No friction rub. No gallop.   Pulmonary:     Effort: Pulmonary effort is normal. No respiratory distress.     Breath sounds: Normal breath sounds. No wheezing or rales.  Musculoskeletal:        General: Swelling  and tenderness (1+ pitting edema of the left lower extremity with tenderness to palpation.) present.     Right lower leg: No edema.     Left lower leg: Edema present.  Neurological:     Mental Status: She is alert.     Lab Review:     Component Value Date/Time   NA 138 04/15/2020 0849   K 4.0 04/15/2020 0849   CL 104 04/15/2020 0849   CO2 24 04/15/2020 0849   GLUCOSE 134 (H) 04/15/2020 0849   BUN 15 04/15/2020 0849   CREATININE 0.82 04/15/2020 0849   CALCIUM 8.9 04/15/2020 0849   PROT 6.3 (L) 04/15/2020 0849   ALBUMIN 3.3 (L) 04/15/2020 0849   AST 20 04/15/2020 0849   ALT 25 04/15/2020 0849   ALKPHOS 80 04/15/2020 0849   BILITOT 0.4 04/15/2020 0849   GFRNONAA >60 04/15/2020 0849   GFRAA >60 04/15/2020 0849       Component Value Date/Time   WBC 5.2 04/15/2020 0849   WBC 9.2 01/18/2020 0900   RBC 3.56 (L) 04/15/2020 0849   HGB 11.5 (L) 04/15/2020 0849   HGB 12.7 11/13/2013 1506   HCT 35.6 (L) 04/15/2020 0849   PLT 254 04/15/2020 0849   MCV 100.0 04/15/2020 0849   MCH 32.3 04/15/2020 0849   MCHC 32.3 04/15/2020 0849   RDW 16.2 (H) 04/15/2020 0849   LYMPHSABS 1.0 04/15/2020 0849   MONOABS 0.3 04/15/2020 0849   EOSABS 0.1 04/15/2020 0849   BASOSABS 0.0 04/15/2020 0849   -------------------------------  Imaging from last 24 hours (if applicable):  Radiology interpretation: CT Abdomen Pelvis W Contrast  Result Date: 04/16/2020 CLINICAL DATA:  Breast carcinoma. Currently undergoing chemotherapy. Unilateral left leg swelling for 8 weeks. Diarrhea. EXAM: CT ABDOMEN AND  PELVIS WITH CONTRAST TECHNIQUE: Multidetector CT imaging of the abdomen and pelvis was performed using the standard protocol following bolus administration of intravenous contrast. CONTRAST:  166mL OMNIPAQUE IOHEXOL 300 MG/ML  SOLN COMPARISON:  None. FINDINGS: Lower Chest: No acute findings. Hepatobiliary: Mild diffuse hepatic steatosis. Subcentimeter low-attenuation lesion in the anterior right lobe has nonspecific characteristics and is difficult to characterize due to its small size. No other liver lesions identified. At least 1 gallstone is seen measuring 2 cm, however there is no evidence of cholecystitis or biliary ductal dilatation. Pancreas:  No mass or inflammatory changes. Spleen: Within normal limits in size and appearance. Adrenals/Urinary Tract: 2 cm homogeneous left adrenal mass is seen which has nonspecific characteristics. No renal masses identified. No evidence of ureteral calculi or hydronephrosis. Unremarkable unopacified urinary bladder. Stomach/Bowel: No evidence of obstruction, inflammatory process or abnormal fluid collections. Normal appendix visualized. Diverticulosis is seen mainly involving the sigmoid colon, however there is no evidence of diverticulitis. Vascular/Lymphatic: No pathologically enlarged lymph nodes. No abdominal aortic aneurysm. No evidence of IVC iliac vein thrombosis. Reproductive: Fibroid seen in the right uterine fundus measuring 4.3 cm. Adnexal regions are unremarkable. Other:  None. Musculoskeletal: No suspicious bone lesions identified. Lumbar spine fusion hardware noted at L4-5. IMPRESSION: 1. 2 cm indeterminate left adrenal mass, and sub-cm indeterminate liver lesion. Abdomen MRI without and with contrast is recommended for further characterization. 2. Mild hepatic steatosis. 3. Cholelithiasis. No radiographic evidence of cholecystitis. 4. Colonic diverticulosis, without radiographic evidence of diverticulitis. 5. 4.3 cm uterine fibroid. Electronically Signed   By:  Marlaine Hind M.D.   On: 04/16/2020 15:04   VAS [place] LOWER EXTREMITY VENOUS (DVT)  Result Date: 04/15/2020  Lower Venous DVTStudy Indications: Swelling, and Pain.  Risk Factors: Cancer. Limitations: Poor ultrasound/tissue interface, body habitus and patient positioning. Comparison Study: 03/25/2020 - Negative for DVT Performing Technologist: Oliver Hum RVT  Examination Guidelines: A complete evaluation includes B-mode imaging, spectral Doppler, color Doppler, and power Doppler as needed of all accessible portions of each vessel. Bilateral testing is considered an integral part of a complete examination. Limited examinations for reoccurring indications may be performed as noted. The reflux portion of the exam is performed with the patient in reverse Trendelenburg.  +-----+---------------+---------+-----------+----------+--------------+ RIGHTCompressibilityPhasicitySpontaneityPropertiesThrombus Aging +-----+---------------+---------+-----------+----------+--------------+ CFV  Full           Yes      Yes                                 +-----+---------------+---------+-----------+----------+--------------+   +---------+---------------+---------+-----------+----------+--------------+ LEFT     CompressibilityPhasicitySpontaneityPropertiesThrombus Aging +---------+---------------+---------+-----------+----------+--------------+ CFV      Full           Yes      Yes                                 +---------+---------------+---------+-----------+----------+--------------+ SFJ      Full                                                        +---------+---------------+---------+-----------+----------+--------------+ FV Prox  Full                                                        +---------+---------------+---------+-----------+----------+--------------+ FV Mid                  Yes      Yes                                  +---------+---------------+---------+-----------+----------+--------------+ FV Distal               Yes      Yes                                 +---------+---------------+---------+-----------+----------+--------------+ PFV      Full                                                        +---------+---------------+---------+-----------+----------+--------------+ POP      Full           Yes      Yes                                 +---------+---------------+---------+-----------+----------+--------------+ PTV      Full                                                        +---------+---------------+---------+-----------+----------+--------------+  PERO     Full                                                        +---------+---------------+---------+-----------+----------+--------------+     Summary: RIGHT: - No evidence of common femoral vein obstruction.  LEFT: - There is no evidence of deep vein thrombosis in the lower extremity. However, portions of this examination were limited- see technologist comments above.  - No cystic structure found in the popliteal fossa.  *See table(s) above for measurements and observations. Electronically signed by Deitra Mayo MD on 04/15/2020 at 6:51:36 PM.    Final    VAS [place] LOWER EXTREMITY VENOUS (DVT)  Result Date: 03/25/2020  Lower Venous DVTStudy Indications: Swelling.  Risk Factors: Chemotherapy. Comparison Study: no prior Performing Technologist: June Leap RDMS, RVT  Examination Guidelines: A complete evaluation includes B-mode imaging, spectral Doppler, color Doppler, and power Doppler as needed of all accessible portions of each vessel. Bilateral testing is considered an integral part of a complete examination. Limited examinations for reoccurring indications may be performed as noted. The reflux portion of the exam is performed with the patient in reverse Trendelenburg.   +-----+---------------+---------+-----------+----------+--------------+ RIGHTCompressibilityPhasicitySpontaneityPropertiesThrombus Aging +-----+---------------+---------+-----------+----------+--------------+ CFV  Full           Yes      Yes                                 +-----+---------------+---------+-----------+----------+--------------+   +---------+---------------+---------+-----------+----------+--------------+ LEFT     CompressibilityPhasicitySpontaneityPropertiesThrombus Aging +---------+---------------+---------+-----------+----------+--------------+ CFV      Full           Yes      Yes                                 +---------+---------------+---------+-----------+----------+--------------+ SFJ      Full                                                        +---------+---------------+---------+-----------+----------+--------------+ FV Prox  Full                                                        +---------+---------------+---------+-----------+----------+--------------+ FV Mid   Full                                                        +---------+---------------+---------+-----------+----------+--------------+ FV DistalFull                                                        +---------+---------------+---------+-----------+----------+--------------+ PFV  Full                                                        +---------+---------------+---------+-----------+----------+--------------+ POP      Full           Yes      Yes                                 +---------+---------------+---------+-----------+----------+--------------+ PTV      Full                                                        +---------+---------------+---------+-----------+----------+--------------+ PERO     Full                                                         +---------+---------------+---------+-----------+----------+--------------+     Summary: RIGHT: - No evidence of common femoral vein obstruction.  LEFT: - There is no evidence of deep vein thrombosis in the lower extremity.  - No cystic structure found in the popliteal fossa.  *See table(s) above for measurements and observations. Electronically signed by Deitra Mayo MD on 03/25/2020 at 3:14:06 PM.    Final         This was discussed with Dr. Nicholas Lose.  He expressed agreement with this plan.

## 2020-04-17 NOTE — H&P (Signed)
History and Physical  [name] [id number] DOB: 08-31-1951 DOA: 04/17/2020  Referring physician: Dr. Nicholas Lose  PCP: Maurice Small, MD  Outpatient Specialists: Dr. Nicholas Lose ([email or web address]) Patient coming from: Home ( direct admission from cancer center) At her baseline ambulates  Chief Complaint: Worsening redness, pain in left leg  HPI: [name] is a 69 y.o. female with medical history significant for ER positive malignant neoplasm of the right breast undergoing chemotherapy (trastuzumab and taxol) by Dr. Payton Mccallum, hypothyroidism, anxiety/depression, HTN who presents on 04/17/2020 with worsening left leg swelling, redness and tenderness.  She initially noticed redness on left foot after a frozen right gross from her freezer fell onto her foot approximately 6 weeks ago.  At that time she noticed redness with a bump that progressed to swelling.  She was seen by her primary care physician and given a course of Keflex for about a week with little improvement followed by Bactrim with still no improvement.  She underwent venous duplex on 4/6 for evaluation of her swelling by her oncologist which was negative for DVT.  The swelling and redness and pain continue to worsen despite elevating legs and wearing compression hose.  Repeat venous duplex on 4/27 was also again negative for DVT underwent CT abdomen and pelvis on 4/28 which was negative for any lymph node blockage or enlargement.  She was started on IV ceftriaxone and IV vancomycin at the cancer center on 4/27.  She received her third dose on 4/28 still no improvement in redness, swelling or pain which prompted direct admission to [facility] long for further management of left lower extremity cellulitis.  On arrival to [facility] long patient was afebrile, nontoxic-appearing, hemodynamically stable and complaining of exquisite pain to left leg  Chronic diarrhea.  Related to Herceptin.  Takes Imodium as needed at home.  Denies any nausea or  vomiting.  She denies any fevers, chills, nausea, vomiting, chest pain, shortness of breath, abdominal pain, other areas of redness, swelling or tenderness.  Denies any drainage redness at Port-A-Cath site   Review of Systems:As mentioned in the history of present illness.Review of systems are otherwise negative Patient seen on the floor   Past Medical History:  Diagnosis Date   Anxiety    Cancer [facility])    breast   Chronic pain    Concussion 3/08   ICU x 3 days   Depression    Hypertension    Hypothyroidism    Spinal stenosis    Thyroid disease ?1994   Past Surgical History:  Procedure Laterality Date   BREAST LUMPECTOMY WITH RADIOACTIVE SEED AND SENTINEL LYMPH NODE BIOPSY Right 01/22/2020   Procedure: RIGHT BREAST LUMPECTOMY WITH RADIOACTIVE SEED X2 AND RIGHT SENTINEL LYMPH NODE MAPPING;  Surgeon: Erroll Luna, MD;  Location: [place];  Service: General;  Laterality: Right;   BREAST SURGERY Right 4/99   breast biopsy, benign   PORTACATH PLACEMENT Right 01/22/2020   Procedure: INSERTION PORT-A-CATH WITH ULTRASOUND;  Surgeon: Erroll Luna, MD;  Location: [place];  Service: General;  Laterality: Right;   PORTACATH PLACEMENT Right 02/28/2020   Procedure: PORT A CATH REVISION;  Surgeon: Erroll Luna, MD;  Location: [place];  Service: General;  Laterality: Right;   SPINAL FUSION  03/04/11   with ORIF   Allergies  Allergen Reactions   Ambien [Zolpidem Tartrate] Other (See Comments)    Hallucinations and Confusion   Social History:  reports that she has quit smoking. Her smoking use included cigarettes.  She has never used smokeless tobacco. She reports current alcohol use of about 5.0 standard drinks of alcohol per week. She reports that she does not use drugs. Family History  Problem Relation Age of Onset   Thyroid disease Mother    Dementia Mother    Diabetes Father    Stroke Father    Hypertension Sister    Diabetes  Sister       Prior to Admission medications   Medication Sig Start Date End Date Taking? Authorizing Provider  amitriptyline (ELAVIL) 25 MG tablet Take 75 mg by mouth at bedtime.    Yes [provider]  amLODipine (NORVASC) 5 MG tablet Take 7.5 mg by mouth at bedtime.    Yes [provider]  diazepam (VALIUM) 10 MG tablet Take 1 tablet (10 mg total) by mouth at bedtime as needed for anxiety. Patient taking differently: Take 10 mg by mouth at bedtime as needed for sleep.  02/19/20  Yes Nicholas Lose, MD  FLUoxetine (PROZAC) 40 MG capsule Take 80 mg by mouth at bedtime.  01/01/18  Yes [provider]  ibuprofen (ADVIL,MOTRIN) 200 MG tablet Take 400-600 mg by mouth 2 (two) times daily as needed for moderate pain.    Yes [provider]  levothyroxine (SYNTHROID, LEVOTHROID) 137 MCG tablet Take 137 mcg by mouth at bedtime.    Yes [provider]  lidocaine-prilocaine (EMLA) cream Apply to affected area once Patient taking differently: Apply 1 application topically daily as needed (prior to port being accessed.). Apply to affected area once 01/30/20  Yes Nicholas Lose, MD  cephALEXin (KEFLEX) 500 MG capsule Take 1 capsule (500 mg total) by mouth 2 (two) times daily. Patient not taking: Reported on 04/17/2020 03/25/20   Nicholas Lose, MD  furosemide (LASIX) 20 MG tablet Take 1 tablet (20 mg total) by mouth daily. Patient not taking: Reported on 04/17/2020 03/11/20   Nicholas Lose, MD  HYDROcodone-acetaminophen (NORCO/VICODIN) 5-325 MG tablet Take 1 tablet by mouth every 6 (six) hours as needed for moderate pain. Patient not taking: Reported on 04/17/2020 02/28/20   Erroll Luna, MD  ondansetron (ZOFRAN) 8 MG tablet Take 1 tablet (8 mg total) by mouth 2 (two) times daily as needed (Nausea or vomiting). Patient not taking: Reported on 04/17/2020 01/30/20   Nicholas Lose, MD  prochlorperazine (COMPAZINE) 10 MG tablet Take 1 tablet (10 mg total) by mouth every 6 (six)  hours as needed (Nausea or vomiting). Patient not taking: Reported on 04/08/2020 01/30/20   Nicholas Lose, MD  sulfamethoxazole-trimethoprim (BACTRIM DS) 800-160 MG tablet Take 1 tablet by mouth 2 (two) times daily. Patient not taking: Reported on 04/17/2020 04/01/20   Sandi Mealy E., PA-C  traMADol (ULTRAM) 50 MG tablet TAKE ONE TABLET EVERY 6 HOURS AS NEEDED. Patient not taking: Reported on 04/17/2020 04/01/20   Nicholas Lose, MD    Physical Exam: BP (!) 156/80 (BP Location: Left Wrist)    Pulse 80    Temp 98.5 F (36.9 C) (Oral)    Resp 16    Wt 114.6 kg    LMP  (LMP Unknown)    SpO2 95%    BMI 39.58 kg/m   Constitutional normal appearing female Eyes: EOMI, anicteric, normal conjunctiva ENMT: Oropharynx with moist mucous membranes, normal dentition Cardiovascular: RRR no MRGs, Respiratory: Normal respiratory effort on room air, clear breath sounds  Abdomen: Soft,non-tender, normal bowel sounds Skin: Right chest wall with port-acath in place that is clean, dry, and intact. Left lower extremity with blanching  erythema from foot that extends up towards mid thigh, on medial and lateral aspects, spares the center of thigh, very tender to touch, no open lesions or drainage noted   Medial aspect of left thigh  Lateral aspect of left thigh    Neurologic: Grossly no focal neuro deficit. Psychiatric:Appropriate affect, and mood. Mental status AAOx3          Labs on Admission:  Basic Metabolic Panel: Recent Labs  Lab 04/15/20 0849 04/17/20 1841  NA 138 144  K 4.0 2.8*  CL 104 112*  CO2 24 24  GLUCOSE 134* 89  BUN 15 20  CREATININE 0.82 0.51  CALCIUM 8.9 7.0*  MG  --  1.7   Liver Function Tests: Recent Labs  Lab 04/15/20 0849 04/17/20 1841  AST 20 23  ALT 25 22  ALKPHOS 80 51  BILITOT 0.4 0.5  PROT 6.3* 5.4*  ALBUMIN 3.3* 3.0*   No results for input(s): LIPASE, AMYLASE in the last 168 hours. No results for input(s): AMMONIA in the last 168 hours. CBC: Recent Labs    Lab 04/15/20 0849 04/17/20 1841  WBC 5.2 4.1  NEUTROABS 3.8  --   HGB 11.5* 9.3*  HCT 35.6* 29.5*  MCV 100.0 102.8*  PLT 254 215   Cardiac Enzymes: No results for input(s): CKTOTAL, CKMB, CKMBINDEX, TROPONINI in the last 168 hours.  BNP (last 3 results) No results for input(s): BNP in the last 8760 hours.  ProBNP (last 3 results) No results for input(s): PROBNP in the last 8760 hours.  CBG: No results for input(s): GLUCAP in the last 168 hours.  Radiological Exams on Admission: CT Abdomen Pelvis W Contrast  Result Date: 04/16/2020 CLINICAL DATA:  Breast carcinoma. Currently undergoing chemotherapy. Unilateral left leg swelling for 8 weeks. Diarrhea. EXAM: CT ABDOMEN AND PELVIS WITH CONTRAST TECHNIQUE: Multidetector CT imaging of the abdomen and pelvis was performed using the standard protocol following bolus administration of intravenous contrast. CONTRAST:  173mL OMNIPAQUE IOHEXOL 300 MG/ML  SOLN COMPARISON:  None. FINDINGS: Lower Chest: No acute findings. Hepatobiliary: Mild diffuse hepatic steatosis. Subcentimeter low-attenuation lesion in the anterior right lobe has nonspecific characteristics and is difficult to characterize due to its small size. No other liver lesions identified. At least 1 gallstone is seen measuring 2 cm, however there is no evidence of cholecystitis or biliary ductal dilatation. Pancreas:  No mass or inflammatory changes. Spleen: Within normal limits in size and appearance. Adrenals/Urinary Tract: 2 cm homogeneous left adrenal mass is seen which has nonspecific characteristics. No renal masses identified. No evidence of ureteral calculi or hydronephrosis. Unremarkable unopacified urinary bladder. Stomach/Bowel: No evidence of obstruction, inflammatory process or abnormal fluid collections. Normal appendix visualized. Diverticulosis is seen mainly involving the sigmoid colon, however there is no evidence of diverticulitis. Vascular/Lymphatic: No pathologically  enlarged lymph nodes. No abdominal aortic aneurysm. No evidence of IVC iliac vein thrombosis. Reproductive: Fibroid seen in the right uterine fundus measuring 4.3 cm. Adnexal regions are unremarkable. Other:  None. Musculoskeletal: No suspicious bone lesions identified. Lumbar spine fusion hardware noted at L4-5. IMPRESSION: 1. 2 cm indeterminate left adrenal mass, and sub-cm indeterminate liver lesion. Abdomen MRI without and with contrast is recommended for further characterization. 2. Mild hepatic steatosis. 3. Cholelithiasis. No radiographic evidence of cholecystitis. 4. Colonic diverticulosis, without radiographic evidence of diverticulitis. 5. 4.3 cm uterine fibroid. Electronically Signed   By: Marlaine Hind M.D.   On: 04/16/2020 15:04    Assessment/Plan Present on Admission:  Left leg cellulitis  Cellulitis  of left leg  Hypokalemia  Hypothyroidism  Macrocytic anemia  Active Problems:   Hypothyroidism   Malignant neoplasm of upper-inner quadrant of right breast in female, estrogen receptor positive (HCC)   Left leg cellulitis   Cellulitis of left leg   Hypokalemia   Macrocytic anemia  Nonpurulent cellulitis of left lower extremity in patient undergoing chemotherapy with failed outpatient antibiotic treatment.  Swelling, redness, tenderness has worsened despite outpatient treatment with Keflex, followed by Bactrim, followed by recent start of vancomycin/ceftriaxone on 4/27.  At increased risk of infections given concurrent chemotherapy on Taxol infusion.  Encouraged as patient is nontoxic-appearing and hemodynamically stable without current sepsis physiology -Continue empiric ceftriaxone, vancomycin -Obtain blood cultures -X-ray left leg/thigh to ensure no deep-seated infection  Right breast cancer status post right breast lumpectomy x2, currently undergoing adjuvant chemotherapy.  Followed by Dr. Lindi Adie as outpatient.  Her right chest wall Port-A-Cath with no signs of  infection. -Direct admit from cancer center due to above  Hypokalemia, likely from diarrhea related to current chemotherapy (Herceptin).  Magnesium within normal limits, has occasional nausea, no vomiting, reports diarrhea in the setting of her undergoing chemotherapy. -Monitor BMs, takes Imodium as needed at home -Replete orally -Repeat BMP in a.m.  Macrocytic anemia.  Recent hemoglobin range 10.5-11.5.  9.3 on admission.  No localizing signs or symptoms of bleeding.-Repeat CBC in a.m.  Hypothyroidism. -Check TSH -Continue home Synthroid 137 mcg daily  Anxiety/depression.  Stable -Diazepam as needed bedtime -Continue home Elavil and Prozac  Hypertension.  SBP 150s. -Holding home amlodipine in setting of left lower leg swelling, likely related to cellulitis -As needed IV hydralazine -Closely monitor  DVT prophylaxis: heparin  Code Status: DNR, discussed on day of admission would not want resuscitation if cardiac or respiratory arrest. Would be ok for intubation for respiratory distress if necessary  Family Communication: Husband was present at bedside and updated appropriately at the time of interview.   Disposition Plan: IV vancomycin/ceftriaxone, xr leg to ensure no deeper infection, high risk given suppressed immune system from chemotherapy  Consults called: none  Admission status: Admitted as npatient med-surge unit.      Desiree Hane MD [facility]  Pager [phone]  If 7PM-7AM, please contact night-coverage [email or web address] Password [facility]  04/17/2020, 8:28 PM

## 2020-04-18 ENCOUNTER — Encounter (HOSPITAL_COMMUNITY): Payer: Self-pay | Admitting: Internal Medicine

## 2020-04-18 ENCOUNTER — Inpatient Hospital Stay (HOSPITAL_COMMUNITY): Payer: Medicare PPO

## 2020-04-18 ENCOUNTER — Ambulatory Visit: Payer: Medicare PPO

## 2020-04-18 DIAGNOSIS — F329 Major depressive disorder, single episode, unspecified: Secondary | ICD-10-CM

## 2020-04-18 DIAGNOSIS — T451X5A Adverse effect of antineoplastic and immunosuppressive drugs, initial encounter: Secondary | ICD-10-CM

## 2020-04-18 DIAGNOSIS — K521 Toxic gastroenteritis and colitis: Secondary | ICD-10-CM

## 2020-04-18 DIAGNOSIS — F419 Anxiety disorder, unspecified: Secondary | ICD-10-CM

## 2020-04-18 DIAGNOSIS — L03116 Cellulitis of left lower limb: Secondary | ICD-10-CM | POA: Diagnosis not present

## 2020-04-18 LAB — BASIC METABOLIC PANEL
Anion gap: 7 (ref 5–15)
BUN: 28 mg/dL — ABNORMAL HIGH (ref 8–23)
CO2: 26 mmol/L (ref 22–32)
Calcium: 8.5 mg/dL — ABNORMAL LOW (ref 8.9–10.3)
Chloride: 107 mmol/L (ref 98–111)
Creatinine, Ser: 0.71 mg/dL (ref 0.44–1.00)
GFR calc Af Amer: >60 mL/min (ref >60)
GFR calc non Af Amer: >60 mL/min (ref >60)
Glucose, Bld: 120 mg/dL — ABNORMAL HIGH (ref 70–99)
Potassium: 3.9 mmol/L (ref 3.5–5.1)
Sodium: 140 mmol/L (ref 135–145)

## 2020-04-18 LAB — SARS CORONAVIRUS 2 (TAT 6-24 HRS): SARS Coronavirus 2: NEGATIVE

## 2020-04-18 LAB — CBC
HCT: 32.8 % — ABNORMAL LOW (ref 36.0–46.0)
Hemoglobin: 10.4 g/dL — ABNORMAL LOW (ref 12.0–15.0)
MCH: 33 pg (ref 26.0–34.0)
MCHC: 31.7 g/dL (ref 30.0–36.0)
MCV: 104.1 fL — ABNORMAL HIGH (ref 80.0–100.0)
Platelets: 240 10*3/uL (ref 150–400)
RBC: 3.15 MIL/uL — ABNORMAL LOW (ref 3.87–5.11)
RDW: 17.4 % — ABNORMAL HIGH (ref 11.5–15.5)
WBC: 3.9 10*3/uL — ABNORMAL LOW (ref 4.0–10.5)
nRBC: 0 % (ref 0.0–0.2)

## 2020-04-18 MED ORDER — HEPARIN SOD (PORK) LOCK FLUSH 100 UNIT/ML IV SOLN
INTRAVENOUS | Status: AC
  Administered 2020-04-18: 500 [IU] via INTRAVENOUS
  Filled 2020-04-18: qty 5

## 2020-04-18 MED ORDER — HEPARIN SOD (PORK) LOCK FLUSH 100 UNIT/ML IV SOLN: 500.0000 [IU] | Freq: Once | INTRAVENOUS | Status: AC

## 2020-04-18 MED ORDER — CHLORHEXIDINE GLUCONATE CLOTH 2 % EX PADS
6.0000 | MEDICATED_PAD | Freq: Every day | CUTANEOUS | Status: DC
  Administered 2020-04-18 – 2020-04-24 (×6): 6 via TOPICAL

## 2020-04-18 MED ORDER — SODIUM CHLORIDE (PF) 0.9 % IJ SOLN
INTRAMUSCULAR | Status: AC
  Filled 2020-04-18: qty 100

## 2020-04-18 MED ORDER — SODIUM CHLORIDE (PF) 0.9 % IJ SOLN
INTRAMUSCULAR | Status: AC
  Filled 2020-04-18: qty 50

## 2020-04-18 MED ORDER — IOHEXOL 300 MG/ML  SOLN
100.0000 mL | Freq: Once | INTRAMUSCULAR | Status: AC | PRN
  Administered 2020-04-18: 100 mL via INTRAVENOUS

## 2020-04-18 NOTE — Progress Notes (Signed)
Pharmacy Antibiotic Note  [name] is a 69 y.o. female admitted on 04/17/2020 with cellulitis.  Pharmacy has been consulted for Vancomycin dosing.  Patient received CTX at [facility] 4/27 & 28, Vancomycin 1gm 4/27 & 29  Today, 04/18/2020:  Remains afebrile  WBC borderline low  SCr up 50% but remains < 0.8  Plan:  Continue Vancomycin 1500mg  q24 (AUC 466 w/ SCr 0.82; Vd 0.5)  Rocephin 2gm q24 per MD, dosing appropriate  Weight: 114.7 kg (252 lb 13.9 oz)  Temp (24hrs), Avg:98.4 F (36.9 C), Min:98.1 F (36.7 C), Max:98.6 F (37 C)  Recent Labs  Lab 04/15/20 0849 04/17/20 1841 04/18/20 0612  WBC 5.2 4.1 3.9*  CREATININE 0.82 0.51 0.71    Estimated Creatinine Clearance: 85.5 mL/min (by C-G formula based on SCr of 0.71 mg/dL).    Allergies  Allergen Reactions  . Ambien [Zolpidem Tartrate] Other (See Comments)    Hallucinations and Confusion    Antimicrobials this admission: 4/27 CTX >>  4/27 Vanc x1, resumed 4/29   Dose adjustments this admission:  Microbiology results: 4/29 BCx: ngtd HIV: non-reactive  Thank you for allowing pharmacy to be a part of this patient's care.  Quincy Prisco A PharmD 04/18/2020 3:35 PM

## 2020-04-18 NOTE — Progress Notes (Signed)
[facility]  PROGRESS NOTE  [name] [id number] DOB: 1951-02-28 DOA: 04/17/2020 PCP: Maurice Small, MD Admit date - 04/17/2020   Admitting Physician Desiree Hane, MD  Outpatient Primary MD for the patient is Maurice Small, MD  LOS - 1 Brief Narrative   HPI: [name] is a 69 y.o. female with medical history significant for ER positive malignant neoplasm of the right breast undergoing chemotherapy (trastuzumab and taxol) by Dr. Payton Mccallum, hypothyroidism, anxiety/depression, HTN who presents on 04/17/2020 with worsening left leg swelling, redness and tenderness.  She initially noticed redness on left foot after a frozen right gross from her freezer fell onto her foot approximately 6 weeks ago.  At that time she noticed redness with a bump that progressed to swelling.  She was seen by her primary care physician and given a course of Keflex for about a week with little improvement followed by Bactrim with still no improvement.  She underwent venous duplex on 4/6 for evaluation of her swelling by her oncologist which was negative for DVT.  The swelling and redness and pain continue to worsen despite elevating legs and wearing compression hose.  Repeat venous duplex on 4/27 was also again negative for DVT underwent CT abdomen and pelvis on 4/28 which was negative for any lymph node blockage or enlargement.  She was started on IV ceftriaxone and IV vancomycin at the cancer center on 4/27.  She received her third dose on 4/28 still no improvement in redness, swelling or pain which prompted direct admission to [facility] for further management of left lower extremity cellulitis.  On arrival to [facility] patient was afebrile, nontoxic-appearing, hemodynamically stable and complaining of exquisite pain to left leg  Chronic diarrhea.  Related to Herceptin.  Takes Imodium as needed at home.  Denies any nausea or vomiting.  She denies any fevers, chills, nausea, vomiting, chest pain,  shortness of breath, abdominal pain, other areas of redness, swelling or tenderness.  Denies any drainage redness at Port-A-Cath site    Subjective  Today feels like her left leg is much improved, denies fevers or chills.  Eating well.  No nausea no vomiting.  No diarrhea  A & P    Nonpurulent cellulitis of left lower extremity in patient undergoing chemotherapy with failed outpatient antibiotic treatment.  Swelling, redness, tenderness has worsened despite outpatient treatment with Keflex, followed by Bactrim, followed by recent start of vancomycin/ceftriaxone on 4/27.  At increased risk of infections given concurrent chemotherapy on Taxol infusion.  Encouraged as patient is nontoxic-appearing and hemodynamically stable without current sepsis physiology.  X-ray shows edema, CT of the leg consistent with cellulitis with no signs of abscess or deep-seated infection -Continue empiric ceftriaxone, vancomycin, given nonpurulent nature may be able to discontinue vancomycin in 24 hours -Monitor blood cultures  Right breast cancer status post right breast lumpectomy x2, currently undergoing adjuvant chemotherapy.  Followed by Dr. Lindi Adie as outpatient.  Her right chest wall Port-A-Cath with no signs of infection. -Direct admit from cancer center due to above  Hypokalemia, likely from diarrhea related to current chemotherapy (Herceptin), resolved after oral repletion.  Magnesium within normal limits, has occasional nausea, no vomiting, reports diarrhea in the setting of her undergoing chemotherapy.  No diarrhea. -Monitor BMs, takes Imodium as needed at home -Monitor BMP  Macrocytic anemia.  Recent hemoglobin range 10.5-11.5.  9.3 on admission.  No localizing signs or symptoms of bleeding.-Repeat CBC in a.m.  Hypothyroidism. TSH elevated. Already on quite high dose, likely  related to poor absorption if synthroid not taken alone -Continue home Synthroid 137 mcg daily  Anxiety/depression.   Stable -Diazepam as needed bedtime -Continue home Elavil and Prozac  Hypertension.  SBP 150s. -Holding home amlodipine in setting of left lower leg swelling related to cellulitis -As needed IV hydralazine -Closely monitor      Family Communication  : None  Code Status : DNR, discussed on day of admission  Disposition Plan  :  Patient is from home. Anticipated d/c date: 2 to 3 days. Barriers to d/c or necessity for inpatient status:  Currently requiring IV antibiotics for treatment of cellulitis Outpatient therapy Consults  : None  Procedures  : None  DVT Prophylaxis  : Heparin  Lab Results  Component Value Date   PLT 240 04/18/2020    Diet :  Diet Order            Diet Heart Room service appropriate? Yes; Fluid consistency: Thin  Diet effective now               Inpatient Medications Scheduled Meds: . amitriptyline  75 mg Oral QHS  . Chlorhexidine Gluconate Cloth  6 each Topical Daily  . docusate sodium  100 mg Oral BID  . FLUoxetine  80 mg Oral QHS  . heparin  5,000 Units Subcutaneous Q8H  . levothyroxine  137 mcg Oral QHS  . sodium chloride (PF)      . sodium chloride (PF)       Continuous Infusions: . cefTRIAXone (ROCEPHIN)  IV 2 g (04/18/20 1222)  . vancomycin Stopped (04/17/20 2350)   PRN Meds:.acetaminophen **OR** acetaminophen, diazepam, hydrALAZINE, ondansetron **OR** ondansetron (ZOFRAN) IV, oxyCODONE-acetaminophen, polyethylene glycol  Antibiotics  :   Anti-infectives (From admission, onward)   Start     Dose/Rate Route Frequency Ordered Stop   04/18/20 1200  cefTRIAXone (ROCEPHIN) 2 g in sodium chloride 0.9 % 100 mL IVPB     2 g 200 mL/hr over 30 Minutes Intravenous Every 24 hours 04/17/20 1827     04/17/20 2000  vancomycin (VANCOREADY) IVPB 1500 mg/300 mL     1,500 mg 150 mL/hr over 120 Minutes Intravenous Every 24 hours 04/17/20 1847     04/17/20 1930  cefTRIAXone (ROCEPHIN) 1 g in sodium chloride 0.9 % 100 mL IVPB     1 g 200 mL/hr  over 30 Minutes Intravenous  Once 04/17/20 1827 04/17/20 2118       Objective   Vitals:   04/17/20 2030 04/18/20 0500 04/18/20 0509 04/18/20 1657  BP: (!) 152/71  132/65 (!) 160/100  Pulse: 81  81 83  Resp: 20  18 18   Temp: 98.1 F (36.7 C)  98.6 F (37 C) 98.2 F (36.8 C)  TempSrc: Oral   Oral  SpO2: 94%  95% 96%  Weight:  114.7 kg      SpO2: 96 %  Wt Readings from Last 3 Encounters:  04/18/20 114.7 kg  04/16/20 114.6 kg  04/15/20 99.3 kg     Intake/Output Summary (Last 24 hours) at 04/18/2020 2132 Last data filed at 04/18/2020 1908 Gross per 24 hour  Intake 780 ml  Output --  Net 780 ml    Physical Exam:     Awake Alert, Oriented X 3, Normal affect No new F.N deficits,  [place].AT, Normal respiratory effort on room air, CTAB RRR,No Gallops,Rubs or new Murmurs,  +ve B.Sounds, Abd Soft, No tenderness, No rebound, guarding or rigidity. Skin (photos in H&P) relatively unchanged erythema extending from  left foot to upper thigh both medial and lateral aspects, tender to touch, no open wounds or drainage   I have personally reviewed the following:   Data Reviewed:  CBC Recent Labs  Lab 04/15/20 0849 04/17/20 1841 04/18/20 0612  WBC 5.2 4.1 3.9*  HGB 11.5* 9.3* 10.4*  HCT 35.6* 29.5* 32.8*  PLT 254 215 240  MCV 100.0 102.8* 104.1*  MCH 32.3 32.4 33.0  MCHC 32.3 31.5 31.7  RDW 16.2* 16.7* 17.4*  LYMPHSABS 1.0  --   --   MONOABS 0.3  --   --   EOSABS 0.1  --   --   BASOSABS 0.0  --   --     Chemistries  Recent Labs  Lab 04/15/20 0849 04/17/20 1841 04/18/20 0612  NA 138 144 140  K 4.0 2.8* 3.9  CL 104 112* 107  CO2 24 24 26   GLUCOSE 134* 89 120*  BUN 15 20 28*  CREATININE 0.82 0.51 0.71  CALCIUM 8.9 7.0* 8.5*  MG  --  1.7  --   AST 20 23  --   ALT 25 22  --   ALKPHOS 80 51  --   BILITOT 0.4 0.5  --    ------------------------------------------------------------------------------------------------------------------ No results for  input(s): CHOL, HDL, LDLCALC, TRIG, CHOLHDL, LDLDIRECT in the last 72 hours.  No results found for: HGBA1C ------------------------------------------------------------------------------------------------------------------ Recent Labs    04/17/20 2038  TSH 30.410*   ------------------------------------------------------------------------------------------------------------------ No results for input(s): VITAMINB12, FOLATE, FERRITIN, TIBC, IRON, RETICCTPCT in the last 72 hours.  Coagulation profile No results for input(s): INR, PROTIME in the last 168 hours.  No results for input(s): DDIMER in the last 72 hours.  Cardiac Enzymes No results for input(s): CKMB, TROPONINI, MYOGLOBIN in the last 168 hours.  Invalid input(s): CK ------------------------------------------------------------------------------------------------------------------    Component Value Date/Time   BNP 86.0 02/02/2018 0242    Micro Results Recent Results (from the past 240 hour(s))  Culture, blood (routine x 2)     Status: None (Preliminary result)   Collection Time: 04/17/20  6:21 PM   Specimen: BLOOD  Result Value Ref Range Status   Specimen Description   Final    BLOOD LEFT ANTECUBITAL Performed at [facility] [address], [place], [place] [address]    Special Requests   Final    BOTTLES DRAWN AEROBIC AND ANAEROBIC Blood Culture results may not be optimal due to an excessive volume of blood received in culture bottles Performed at [facility] [address] [place], [place] [address]    Culture   Final    NO GROWTH < 24 HOURS Performed at [facility] [address], [place], [place] [address]    Report Status PENDING  Incomplete  Culture, blood (routine x 2)     Status: None (Preliminary result)   Collection Time: 04/17/20  6:21 PM   Specimen: BLOOD  Result Value Ref Range Status   Specimen Description   Final    BLOOD RIGHT  ANTECUBITAL Performed at [facility] [address] [place], [place] [address]    Special Requests   Final    BOTTLES DRAWN AEROBIC AND ANAEROBIC Blood Culture results may not be optimal due to an excessive volume of blood received in culture bottles Performed at [facility] [address], [place],  [address]    Culture   Final    NO GROWTH < 24 HOURS Performed at [facility], [address], [place], [place] [address]    Report Status PENDING  Incomplete  SARS CORONAVIRUS 2 (TAT 6-24 HRS) Nasopharyngeal Nasopharyngeal Swab     Status: None   Collection Time: 04/18/20  6:12 AM   Specimen: Nasopharyngeal Swab  Result Value Ref Range Status   SARS Coronavirus 2 NEGATIVE NEGATIVE Final    Comment: (NOTE) SARS-CoV-2 target nucleic acids are NOT DETECTED. The SARS-CoV-2 RNA is generally detectable in upper and lower respiratory specimens during the acute phase of infection. Negative results do not preclude SARS-CoV-2 infection, do not rule out co-infections with other pathogens, and should not be used as the sole basis for treatment or other patient management decisions. Negative results must be combined with clinical observations, patient history, and epidemiological information. The expected result is Negative. Fact Sheet for Patients: SugarRoll.be Fact Sheet for Healthcare Providers: https://www.woods-mathews.com/ This test is not yet approved or cleared by the [place] FDA and  has been authorized for detection and/or diagnosis of SARS-CoV-2 by FDA under an Emergency Use Authorization (EUA). This EUA will remain  in effect (meaning this test can be used) for the duration of the COVID-19 declaration under Section 56 4(b)(1) of the Act, 21 U.S.C. section 360bbb-3(b)(1), unless the authorization is terminated or revoked sooner. Performed at [facility], [place] [address]  [place], [place] [address]     Radiology Reports DG Tibia/Fibula Left  Result Date: 04/17/2020 CLINICAL DATA:  Cellulitis EXAM: LEFT TIBIA AND FIBULA - 2 VIEW COMPARISON:  None. FINDINGS: No fracture or malalignment. No periostitis or bone destruction. Diffuse soft tissue edema without emphysema. IMPRESSION: Soft tissue edema.  No acute osseous abnormality Electronically Signed   By: Donavan Foil M.D.   On: 04/17/2020 20:33   CT Abdomen Pelvis W Contrast  Result Date: 04/16/2020 CLINICAL DATA:  Breast carcinoma. Currently undergoing chemotherapy. Unilateral left leg swelling for 8 weeks. Diarrhea. EXAM: CT ABDOMEN AND PELVIS WITH CONTRAST TECHNIQUE: Multidetector CT imaging of the abdomen and pelvis was performed using the standard protocol following bolus administration of intravenous contrast. CONTRAST:  175mL OMNIPAQUE IOHEXOL 300 MG/ML  SOLN COMPARISON:  None. FINDINGS: Lower Chest: No acute findings. Hepatobiliary: Mild diffuse hepatic steatosis. Subcentimeter low-attenuation lesion in the anterior right lobe has nonspecific characteristics and is difficult to characterize due to its small size. No other liver lesions identified. At least 1 gallstone is seen measuring 2 cm, however there is no evidence of cholecystitis or biliary ductal dilatation. Pancreas:  No mass or inflammatory changes. Spleen: Within normal limits in size and appearance. Adrenals/Urinary Tract: 2 cm homogeneous left adrenal mass is seen which has nonspecific characteristics. No renal masses identified. No evidence of ureteral calculi or hydronephrosis. Unremarkable unopacified urinary bladder. Stomach/Bowel: No evidence of obstruction, inflammatory process or abnormal fluid collections. Normal appendix visualized. Diverticulosis is seen mainly involving the sigmoid colon, however there is no evidence of diverticulitis. Vascular/Lymphatic: No pathologically enlarged lymph nodes. No abdominal aortic aneurysm. No evidence of IVC iliac  vein thrombosis. Reproductive: Fibroid seen in the right uterine fundus measuring 4.3 cm. Adnexal regions are unremarkable. Other:  None. Musculoskeletal: No suspicious bone lesions identified. Lumbar spine fusion hardware noted at L4-5. IMPRESSION: 1. 2 cm indeterminate left adrenal mass, and sub-cm indeterminate liver lesion. Abdomen MRI without and with contrast is recommended for further characterization. 2. Mild hepatic steatosis. 3. Cholelithiasis. No radiographic evidence of cholecystitis. 4. Colonic diverticulosis, without radiographic evidence of diverticulitis. 5. 4.3 cm uterine fibroid. Electronically Signed   By:  Marlaine Hind M.D.   On: 04/16/2020 15:04   CT TIBIA FIBULA LEFT W CONTRAST  Result Date: 04/18/2020 CLINICAL DATA:  Cellulitis of the left lower leg. EXAM: CT OF THE LOWER RIGHT EXTREMITY WITH CONTRAST TECHNIQUE: Multidetector CT imaging of the lower right extremity was performed according to the standard protocol following intravenous contrast administration. COMPARISON:  None. CONTRAST:  177mL OMNIPAQUE IOHEXOL 300 MG/ML  SOLN FINDINGS: Bones/Joint/Cartilage The bones of the knee and the tibia and fibula and bones of the ankle joint appear normal. Minimal right knee effusion. No ankle effusion. Muscles and Tendons Normal. Soft tissues There circumferential subcutaneous edema throughout the lower leg, nonspecific. This could represent cellulitis. No definable abscesses. IMPRESSION: 1. Circumferential subcutaneous edema throughout the lower leg, nonspecific. This could represent cellulitis. 2. No definable abscesses. 3. Minimal right knee effusion. Electronically Signed   By: Lorriane Shire M.D.   On: 04/18/2020 12:50   CT FEMUR LEFT W CONTRAST  Result Date: 04/18/2020 CLINICAL DATA:  Cellulitis of the left lower extremity. Left leg swelling. Left leg pain. EXAM: CT OF THE LOWER RIGHT EXTREMITY WITH CONTRAST TECHNIQUE: Multidetector CT imaging of the lower right extremity was performed  according to the standard protocol following intravenous contrast administration. COMPARISON:  Radiographs dated 04/17/2020 CONTRAST:  140mL OMNIPAQUE IOHEXOL 300 MG/ML  SOLN FINDINGS: Bones/Joint/Cartilage The bones of the left hip appear normal. Left femur is normal. No hip effusion. Minimal left knee effusion. Muscles and Tendons Muscles and tendons of the left hip and left thigh are normal. Soft tissues There is skin thickening and subcutaneous edema in the lateral aspect of mid and distal left thigh without a definable abscess. IMPRESSION: 1. Skin thickening and subcutaneous edema in the lateral aspect of the mid and distal left thigh without a definable abscess. The finding is consistent with cellulitis. 2. Minimal left knee effusion. Electronically Signed   By: Lorriane Shire M.D.   On: 04/18/2020 12:47   CT FOOT LEFT W CONTRAST  Result Date: 04/18/2020 CLINICAL DATA:  Cellulitis of the left lower extremity. EXAM: CT OF THE LEFT FOOT WITH CONTRAST TECHNIQUE: Multidetector CT imaging of the left foot was performed according to the standard protocol following intravenous contrast administration. COMPARISON:  Radiographs dated 04/17/2020 CONTRAST:  165mL OMNIPAQUE IOHEXOL 300 MG/ML  SOLN FINDINGS: Bones/Joint/Cartilage Normal. No joint effusions. No osteomyelitis. Muscles and Tendons Normal. Soft tissues There is subcutaneous edema at the medial and lateral aspects of the foot and extending onto the dorsum of the foot. No abscesses. IMPRESSION: 1. Subcutaneous edema of the foot and ankle consistent with cellulitis. 2. No abscess or osteomyelitis. Electronically Signed   By: Lorriane Shire M.D.   On: 04/18/2020 12:53   DG Foot Complete Left  Result Date: 04/17/2020 CLINICAL DATA:  Cellulitis EXAM: LEFT FOOT - COMPLETE 3+ VIEW COMPARISON:  None. FINDINGS: No fracture or malalignment. Diffuse soft tissue edema. No erosion or bony destruction. IMPRESSION: No acute osseous abnormality Electronically Signed    By: Donavan Foil M.D.   On: 04/17/2020 20:34   DG FEMUR 1V LEFT  Result Date: 04/17/2020 CLINICAL DATA:  Cellulitis EXAM: LEFT FEMUR 1 VIEW COMPARISON:  None. FINDINGS: No fracture or malalignment. Soft tissue edema without soft tissue emphysema. IMPRESSION: No acute osseous abnormality Electronically Signed   By: Donavan Foil M.D.   On: 04/17/2020 20:35   VAS [place] LOWER EXTREMITY VENOUS (DVT)  Result Date: 04/15/2020  Lower Venous DVTStudy Indications: Swelling, and Pain.  Risk Factors: Cancer. Limitations: Poor ultrasound/tissue  interface, body habitus and patient positioning. Comparison Study: 03/25/2020 - Negative for DVT Performing Technologist: Oliver Hum RVT  Examination Guidelines: A complete evaluation includes B-mode imaging, spectral Doppler, color Doppler, and power Doppler as needed of all accessible portions of each vessel. Bilateral testing is considered an integral part of a complete examination. Limited examinations for reoccurring indications may be performed as noted. The reflux portion of the exam is performed with the patient in reverse Trendelenburg.  +-----+---------------+---------+-----------+----------+--------------+ RIGHTCompressibilityPhasicitySpontaneityPropertiesThrombus Aging +-----+---------------+---------+-----------+----------+--------------+ CFV  Full           Yes      Yes                                 +-----+---------------+---------+-----------+----------+--------------+   +---------+---------------+---------+-----------+----------+--------------+ LEFT     CompressibilityPhasicitySpontaneityPropertiesThrombus Aging +---------+---------------+---------+-----------+----------+--------------+ CFV      Full           Yes      Yes                                 +---------+---------------+---------+-----------+----------+--------------+ SFJ      Full                                                         +---------+---------------+---------+-----------+----------+--------------+ FV Prox  Full                                                        +---------+---------------+---------+-----------+----------+--------------+ FV Mid                  Yes      Yes                                 +---------+---------------+---------+-----------+----------+--------------+ FV Distal               Yes      Yes                                 +---------+---------------+---------+-----------+----------+--------------+ PFV      Full                                                        +---------+---------------+---------+-----------+----------+--------------+ POP      Full           Yes      Yes                                 +---------+---------------+---------+-----------+----------+--------------+ PTV      Full                                                        +---------+---------------+---------+-----------+----------+--------------+  PERO     Full                                                        +---------+---------------+---------+-----------+----------+--------------+     Summary: RIGHT: - No evidence of common femoral vein obstruction.  LEFT: - There is no evidence of deep vein thrombosis in the lower extremity. However, portions of this examination were limited- see technologist comments above.  - No cystic structure found in the popliteal fossa.  *See table(s) above for measurements and observations. Electronically signed by Deitra Mayo MD on 04/15/2020 at 6:51:36 PM.    Final    VAS [place] LOWER EXTREMITY VENOUS (DVT)  Result Date: 03/25/2020  Lower Venous DVTStudy Indications: Swelling.  Risk Factors: Chemotherapy. Comparison Study: no prior Performing Technologist: June Leap RDMS, RVT  Examination Guidelines: A complete evaluation includes B-mode imaging, spectral Doppler, color Doppler, and power Doppler as needed of all accessible portions of  each vessel. Bilateral testing is considered an integral part of a complete examination. Limited examinations for reoccurring indications may be performed as noted. The reflux portion of the exam is performed with the patient in reverse Trendelenburg.  +-----+---------------+---------+-----------+----------+--------------+ RIGHTCompressibilityPhasicitySpontaneityPropertiesThrombus Aging +-----+---------------+---------+-----------+----------+--------------+ CFV  Full           Yes      Yes                                 +-----+---------------+---------+-----------+----------+--------------+   +---------+---------------+---------+-----------+----------+--------------+ LEFT     CompressibilityPhasicitySpontaneityPropertiesThrombus Aging +---------+---------------+---------+-----------+----------+--------------+ CFV      Full           Yes      Yes                                 +---------+---------------+---------+-----------+----------+--------------+ SFJ      Full                                                        +---------+---------------+---------+-----------+----------+--------------+ FV Prox  Full                                                        +---------+---------------+---------+-----------+----------+--------------+ FV Mid   Full                                                        +---------+---------------+---------+-----------+----------+--------------+ FV DistalFull                                                        +---------+---------------+---------+-----------+----------+--------------+ PFV  Full                                                        +---------+---------------+---------+-----------+----------+--------------+ POP      Full           Yes      Yes                                 +---------+---------------+---------+-----------+----------+--------------+ PTV      Full                                                         +---------+---------------+---------+-----------+----------+--------------+ PERO     Full                                                        +---------+---------------+---------+-----------+----------+--------------+     Summary: RIGHT: - No evidence of common femoral vein obstruction.  LEFT: - There is no evidence of deep vein thrombosis in the lower extremity.  - No cystic structure found in the popliteal fossa.  *See table(s) above for measurements and observations. Electronically signed by Deitra Mayo MD on 03/25/2020 at 3:14:06 PM.    Final      Time Spent in minutes  30     Desiree Hane M.D on 04/18/2020 at 9:32 PM  To page go to [email or web address] - password [facility]

## 2020-04-19 DIAGNOSIS — L03116 Cellulitis of left lower limb: Secondary | ICD-10-CM | POA: Diagnosis not present

## 2020-04-19 LAB — CBC
HCT: 33 % — ABNORMAL LOW (ref 36.0–46.0)
Hemoglobin: 10.3 g/dL — ABNORMAL LOW (ref 12.0–15.0)
MCH: 33 pg (ref 26.0–34.0)
MCHC: 31.2 g/dL (ref 30.0–36.0)
MCV: 105.8 fL — ABNORMAL HIGH (ref 80.0–100.0)
Platelets: 249 10*3/uL (ref 150–400)
RBC: 3.12 MIL/uL — ABNORMAL LOW (ref 3.87–5.11)
RDW: 17.2 % — ABNORMAL HIGH (ref 11.5–15.5)
WBC: 3.6 10*3/uL — ABNORMAL LOW (ref 4.0–10.5)
nRBC: 0 % (ref 0.0–0.2)

## 2020-04-19 LAB — BASIC METABOLIC PANEL
Anion gap: 7 (ref 5–15)
BUN: 26 mg/dL — ABNORMAL HIGH (ref 8–23)
CO2: 26 mmol/L (ref 22–32)
Calcium: 8.4 mg/dL — ABNORMAL LOW (ref 8.9–10.3)
Chloride: 107 mmol/L (ref 98–111)
Creatinine, Ser: 0.63 mg/dL (ref 0.44–1.00)
GFR calc Af Amer: >60 mL/min (ref >60)
GFR calc non Af Amer: >60 mL/min (ref >60)
Glucose, Bld: 118 mg/dL — ABNORMAL HIGH (ref 70–99)
Potassium: 4 mmol/L (ref 3.5–5.1)
Sodium: 140 mmol/L (ref 135–145)

## 2020-04-19 MED ORDER — DIAZEPAM 5 MG PO TABS
10.0000 mg | ORAL_TABLET | Freq: Every day | ORAL | Status: DC | PRN
  Administered 2020-04-19 – 2020-04-22 (×2): 10 mg via ORAL
  Filled 2020-04-19 (×2): qty 2

## 2020-04-19 MED ORDER — LOPERAMIDE HCL 2 MG PO CAPS: 2.0000 mg | ORAL_CAPSULE | Freq: Once | ORAL | Status: DC

## 2020-04-19 NOTE — Plan of Care (Signed)
Discussed with patient plan of care for the evening, pain management and ambulating to the bathroom with assistance and some teach back displayed.

## 2020-04-19 NOTE — H&P (Signed)
PROGRESS NOTE    [name]  [id number] DOB: 1951/10/25 DOA: 04/17/2020 PCP: Maurice Small, MD   Brief Narrative:  69 y.o.femalewith HTN, anxiety/depression, ER positive malignant neoplasm of the right breast undergoing chemotherapy(trastuzumab and taxol)by Dr. Payton Mccallum who presented on4/29/2021with worsening left leg swelling, redness and tenderness.   She initially noticed redness on left foot after a frozen right gross from her freezer fell onto her foot approximately 6 weeks ago. At that time she noticed redness with a bump that progressed to swelling. She was seen by her primary care physician and given a course of Keflex for about a week with little improvement followed by Bactrim with still no improvement. She underwent venous duplex on 4/6 for evaluation of her swelling by her oncologist which was negative for DVT. The swelling and redness and pain continue to worsen despite elevating legs and wearing compression hose. Repeat venous duplex on 4/27 was also again negative for DVT underwent CT abdomen and pelvis on 4/28 which was negative for any lymph node blockage or enlargement. She was started on IV ceftriaxone and IV vancomycin at the cancer center on 4/27. She received her third dose on 4/28 still no improvement in redness, swelling or pain which prompted direct admission to [facility] long for further management of left lower extremity cellulitis.  On arrival to [facility] patient wasafebrile, nontoxic-appearing, hemodynamically stableand complaining of exquisite pain to left leg  Chronic diarrhea. Related to Herceptin. Takes Imodium as needed at home. Denies any nausea or vomiting.  She denies any fevers, chills, nausea, vomiting, chest pain, shortness of breath, abdominal pain, other areas of redness, swelling or tenderness. Denies any drainage redness at Port-A-Cath site     Assessment & Plan:   Active Problems:   Hypothyroidism   Malignant neoplasm  of upper-inner quadrant of right breast in female, estrogen receptor positive ([place])   Left leg cellulitis   Cellulitis of left leg   Hypokalemia   Macrocytic anemia   Anxiety   Depression   Chronic diarrhea   Chemotherapy induced diarrhea   #Nonpurulent cellulitis of left lower extremity in patient undergoing chemotherapy with failed outpatientantibiotictreatment.   -Swelling, redness, tenderness has worsened despite outpatient treatment with Keflex, followed by Bactrim, followed by recent start of vancomycin/ceftriaxone on 4/27.  X-ray shows edema, CT of the leg consistent with cellulitis with no signs of abscess or deep-seated infection -Continue empiric ceftriaxone, vancomycin, given nonpurulent nature may be able to discontinue vancomycin in 24 hours -Monitor blood cultures  #Right breast cancer status post right breast lumpectomy x2, currently undergoing adjuvant chemotherapy. Followed by Dr. Gardiner Fanti outpatient. Her right chest wall Port-A-Cath with no signs of infection. -Direct admit from cancer center due to above  #Hypokalemia, likely from diarrhea related to current chemotherapy (Herceptin), resolved after oral repletion. Magnesium within normal limits, has occasional nausea, no vomiting, reports diarrhea in the setting of her undergoing chemotherapy.  No diarrhea. -Monitor BMs, takes Imodium as needed at home -Monitor BMP  #Macrocytic anemia. Recent hemoglobin range 10.5-11.5. 9.3 on admission. No localizing signs or symptoms of bleeding.-Repeat CBC in a.m.  #Hypothyroidism. TSH elevated. Already on quite high dose, likely related to poor absorption if synthroid not taken alone -Continue home Synthroid 137 mcg daily  #Anxiety/depression.Stable -Diazepam as needed bedtime -Continue home Elavil and Prozac  #Hypertension. SBP 150s. -Holding home amlodipine in setting of left lower leg swelling related to cellulitis -As needed IV hydralazine -Closely  monitor   DVT prophylaxis: Heparin  Code Status: DNR  Family Communication: None Disposition Plan:  Status is: Inpatient  Remains inpatient appropriate because:Inpatient level of care appropriate due to severity of illness   Dispo: The patient is from: Home              Anticipated d/c is to: Home              Anticipated d/c date is: 3 days              Patient currently is not medically stable to d/c.          Consultants:   None   Procedures: None  Antimicrobials: Vancomycin and ceftriaxone  Subjective: No new complaints.  No events overnight.  Objective: Vitals:   04/19/20 0500 04/19/20 0602 04/19/20 0932 04/19/20 1415  BP:  (!) 154/93  (!) 164/80  Pulse:  79  83  Resp:  18  15  Temp:  98 F (36.7 C)  (!) 97 F (36.1 C)  TempSrc:  Oral    SpO2:  98% 96% 97%  Weight: 118.5 kg     Height:        Intake/Output Summary (Last 24 hours) at 04/19/2020 1802 Last data filed at 04/19/2020 1400 Gross per 24 hour  Intake 1300 ml  Output --  Net 1300 ml   Filed Weights   04/18/20 0500 04/18/20 2322 04/19/20 0500  Weight: 114.7 kg 118.8 kg 118.5 kg    Examination:  General exam: Appears calm and comfortable  Respiratory system: Clear to auscultation. Respiratory effort normal. Cardiovascular system: S1 & S2 heard, RRR. No JVD, murmurs, rubs, gallops or clicks. No pedal edema. Gastrointestinal system: Abdomen is nondistended, soft and nontender. No organomegaly or masses felt. Normal bowel sounds heard. Central nervous system: Alert and oriented. No focal neurological deficits. Extremities: Symmetric 5 x 5 power.  Significant asymmetric swelling of the left lower extremity with superficial redness and warmth.  No evidence of localization.  Skin: No rashes, lesions or ulcers Psychiatry: Judgement and insight appear normal. Mood & affect appropriate.     Data Reviewed: I have personally reviewed following labs and imaging studies  CBC: Recent Labs  Lab  04/15/20 0849 04/17/20 1841 04/18/20 0612 04/19/20 0605  WBC 5.2 4.1 3.9* 3.6*  NEUTROABS 3.8  --   --   --   HGB 11.5* 9.3* 10.4* 10.3*  HCT 35.6* 29.5* 32.8* 33.0*  MCV 100.0 102.8* 104.1* 105.8*  PLT 254 215 240 0000000   Basic Metabolic Panel: Recent Labs  Lab 04/15/20 0849 04/17/20 1841 04/18/20 0612 04/19/20 0605  NA 138 144 140 140  K 4.0 2.8* 3.9 4.0  CL 104 112* 107 107  CO2 24 24 26 26   GLUCOSE 134* 89 120* 118*  BUN 15 20 28* 26*  CREATININE 0.82 0.51 0.71 0.63  CALCIUM 8.9 7.0* 8.5* 8.4*  MG  --  1.7  --   --    GFR: Estimated Creatinine Clearance: 87.2 mL/min (by C-G formula based on SCr of 0.63 mg/dL). Liver Function Tests: Recent Labs  Lab 04/15/20 0849 04/17/20 1841  AST 20 23  ALT 25 22  ALKPHOS 80 51  BILITOT 0.4 0.5  PROT 6.3* 5.4*  ALBUMIN 3.3* 3.0*   No results for input(s): LIPASE, AMYLASE in the last 168 hours. No results for input(s): AMMONIA in the last 168 hours. Coagulation Profile: No results for input(s): INR, PROTIME in the last 168 hours. Cardiac Enzymes: No results for input(s): CKTOTAL, CKMB, CKMBINDEX, TROPONINI in the last 168  hours. BNP (last 3 results) No results for input(s): PROBNP in the last 8760 hours. HbA1C: No results for input(s): HGBA1C in the last 72 hours. CBG: No results for input(s): GLUCAP in the last 168 hours. Lipid Profile: No results for input(s): CHOL, HDL, LDLCALC, TRIG, CHOLHDL, LDLDIRECT in the last 72 hours. Thyroid Function Tests: Recent Labs    04/17/20 2038  TSH 30.410*   Anemia Panel: No results for input(s): VITAMINB12, FOLATE, FERRITIN, TIBC, IRON, RETICCTPCT in the last 72 hours. Sepsis Labs: No results for input(s): PROCALCITON, LATICACIDVEN in the last 168 hours.  Recent Results (from the past 240 hour(s))  Culture, blood (routine x 2)     Status: None (Preliminary result)   Collection Time: 04/17/20  6:21 PM   Specimen: BLOOD  Result Value Ref Range Status   Specimen Description    Final    BLOOD LEFT ANTECUBITAL Performed at [facility] [address], [place], [place] [address]    Special Requests   Final    BOTTLES DRAWN AEROBIC AND ANAEROBIC Blood Culture results may not be optimal due to an excessive volume of blood received in culture bottles Performed at [facility] [address] [place], [place] [address]    Culture   Final    NO GROWTH 2 DAYS Performed at [facility] [address], [place], [place] [address]    Report Status PENDING  Incomplete  Culture, blood (routine x 2)     Status: None (Preliminary result)   Collection Time: 04/17/20  6:21 PM   Specimen: BLOOD  Result Value Ref Range Status   Specimen Description   Final    BLOOD RIGHT ANTECUBITAL Performed at [facility] [address] [place], [place] [address]    Special Requests   Final    BOTTLES DRAWN AEROBIC AND ANAEROBIC Blood Culture results may not be optimal due to an excessive volume of blood received in culture bottles Performed at [facility] [address] [place], [place] [address]    Culture   Final    NO GROWTH 2 DAYS Performed at [facility] [address] [place], [place] [address]    Report Status PENDING  Incomplete  SARS CORONAVIRUS 2 (TAT 6-24 HRS) Nasopharyngeal Nasopharyngeal Swab     Status: None   Collection Time: 04/18/20  6:12 AM   Specimen: Nasopharyngeal Swab  Result Value Ref Range Status   SARS Coronavirus 2 NEGATIVE NEGATIVE Final    Comment: (NOTE) SARS-CoV-2 target nucleic acids are NOT DETECTED. The SARS-CoV-2 RNA is generally detectable in upper and lower respiratory specimens during the acute phase of infection. Negative results do not preclude SARS-CoV-2 infection, do not rule out co-infections with other pathogens, and should not be used as the sole basis for treatment or other patient management decisions. Negative results must be combined  with clinical observations, patient history, and epidemiological information. The expected result is Negative. Fact Sheet for Patients: SugarRoll.be Fact Sheet for Healthcare Providers: https://www.woods-mathews.com/ This test is not yet approved or cleared by the [place] FDA and  has been authorized for detection and/or diagnosis of SARS-CoV-2 by FDA under an Emergency Use Authorization (EUA). This EUA will remain  in effect (meaning this test can be used) for the duration of the COVID-19 declaration under Section 56 4(b)(1) of the Act, 21 U.S.C. section 360bbb-3(b)(1), unless the authorization is terminated or revoked sooner. Performed at [facility], [address] [place], [place] [address]          Radiology Studies: DG Tibia/Fibula Left  Result Date: 04/17/2020 CLINICAL DATA:  Cellulitis EXAM: LEFT TIBIA AND FIBULA - 2 VIEW COMPARISON:  None. FINDINGS: No fracture or malalignment. No periostitis or bone destruction. Diffuse soft tissue edema without emphysema. IMPRESSION: Soft tissue edema.  No acute osseous abnormality Electronically Signed   By: Donavan Foil M.D.   On: 04/17/2020 20:33   CT TIBIA FIBULA LEFT W CONTRAST  Result Date: 04/18/2020 CLINICAL DATA:  Cellulitis of the left lower leg. EXAM: CT OF THE LOWER RIGHT EXTREMITY WITH CONTRAST TECHNIQUE: Multidetector CT imaging of the lower right extremity was performed according to the standard protocol following intravenous contrast administration. COMPARISON:  None. CONTRAST:  130mL OMNIPAQUE IOHEXOL 300 MG/ML  SOLN FINDINGS: Bones/Joint/Cartilage The bones of the knee and the tibia and fibula and bones of the ankle joint appear normal. Minimal right knee effusion. No ankle effusion. Muscles and Tendons Normal. Soft tissues There circumferential subcutaneous edema throughout the lower leg, nonspecific. This could represent cellulitis. No definable abscesses. IMPRESSION:  1. Circumferential subcutaneous edema throughout the lower leg, nonspecific. This could represent cellulitis. 2. No definable abscesses. 3. Minimal right knee effusion. Electronically Signed   By: Lorriane Shire M.D.   On: 04/18/2020 12:50   CT FEMUR LEFT W CONTRAST  Result Date: 04/18/2020 CLINICAL DATA:  Cellulitis of the left lower extremity. Left leg swelling. Left leg pain. EXAM: CT OF THE LOWER RIGHT EXTREMITY WITH CONTRAST TECHNIQUE: Multidetector CT imaging of the lower right extremity was performed according to the standard protocol following intravenous contrast administration. COMPARISON:  Radiographs dated 04/17/2020 CONTRAST:  172mL OMNIPAQUE IOHEXOL 300 MG/ML  SOLN FINDINGS: Bones/Joint/Cartilage The bones of the left hip appear normal. Left femur is normal. No hip effusion. Minimal left knee effusion. Muscles and Tendons Muscles and tendons of the left hip and left thigh are normal. Soft tissues There is skin thickening and subcutaneous edema in the lateral aspect of mid and distal left thigh without a definable abscess. IMPRESSION: 1. Skin thickening and subcutaneous edema in the lateral aspect of the mid and distal left thigh without a definable abscess. The finding is consistent with cellulitis. 2. Minimal left knee effusion. Electronically Signed   By: Lorriane Shire M.D.   On: 04/18/2020 12:47   CT FOOT LEFT W CONTRAST  Result Date: 04/18/2020 CLINICAL DATA:  Cellulitis of the left lower extremity. EXAM: CT OF THE LEFT FOOT WITH CONTRAST TECHNIQUE: Multidetector CT imaging of the left foot was performed according to the standard protocol following intravenous contrast administration. COMPARISON:  Radiographs dated 04/17/2020 CONTRAST:  154mL OMNIPAQUE IOHEXOL 300 MG/ML  SOLN FINDINGS: Bones/Joint/Cartilage Normal. No joint effusions. No osteomyelitis. Muscles and Tendons Normal. Soft tissues There is subcutaneous edema at the medial and lateral aspects of the foot and extending onto the  dorsum of the foot. No abscesses. IMPRESSION: 1. Subcutaneous edema of the foot and ankle consistent with cellulitis. 2. No abscess or osteomyelitis. Electronically Signed   By: Lorriane Shire M.D.   On: 04/18/2020 12:53   DG Foot Complete Left  Result Date: 04/17/2020 CLINICAL DATA:  Cellulitis EXAM: LEFT FOOT - COMPLETE 3+ VIEW COMPARISON:  None. FINDINGS: No fracture or malalignment. Diffuse soft tissue edema. No erosion or bony destruction. IMPRESSION: No acute osseous abnormality Electronically Signed   By: Donavan Foil M.D.   On: 04/17/2020 20:34   DG FEMUR 1V LEFT  Result Date: 04/17/2020 CLINICAL  DATA:  Cellulitis EXAM: LEFT FEMUR 1 VIEW COMPARISON:  None. FINDINGS: No fracture or malalignment. Soft tissue edema without soft tissue emphysema. IMPRESSION: No acute osseous abnormality Electronically Signed   By: Donavan Foil M.D.   On: 04/17/2020 20:35        Scheduled Meds: . amitriptyline  75 mg Oral QHS  . Chlorhexidine Gluconate Cloth  6 each Topical Daily  . docusate sodium  100 mg Oral BID  . FLUoxetine  80 mg Oral QHS  . heparin  5,000 Units Subcutaneous Q8H  . levothyroxine  137 mcg Oral QHS   Continuous Infusions: . cefTRIAXone (ROCEPHIN)  IV 2 g (04/19/20 1205)  . vancomycin 1,500 mg (04/18/20 2319)     LOS: 2 days    Time spent: 2 min    Tyvon Eggenberger, MD [facility]   To contact the attending provider between 7A-7P or the covering provider during after hours 7P-7A, please log into the web site [email or web address] and access using universal [place] password for that web site. If you do not have the password, please call the hospital operator.  04/19/2020, 6:02 PM

## 2020-04-20 DIAGNOSIS — L03116 Cellulitis of left lower limb: Secondary | ICD-10-CM | POA: Diagnosis not present

## 2020-04-20 LAB — BASIC METABOLIC PANEL
Anion gap: 6 (ref 5–15)
BUN: 20 mg/dL (ref 8–23)
CO2: 25 mmol/L (ref 22–32)
Calcium: 7.6 mg/dL — ABNORMAL LOW (ref 8.9–10.3)
Chloride: 111 mmol/L (ref 98–111)
Creatinine, Ser: 0.52 mg/dL (ref 0.44–1.00)
GFR calc Af Amer: >60 mL/min (ref >60)
GFR calc non Af Amer: >60 mL/min (ref >60)
Glucose, Bld: 116 mg/dL — ABNORMAL HIGH (ref 70–99)
Potassium: 3 mmol/L — ABNORMAL LOW (ref 3.5–5.1)
Sodium: 142 mmol/L (ref 135–145)

## 2020-04-20 NOTE — Plan of Care (Signed)
Discussed with patient plan of care for the evening, pain management and reoriented patient to situation with some teach back displayed.

## 2020-04-20 NOTE — Plan of Care (Deleted)
Patient talked about the past with moments of hanging out with her girlfriends and jokes they used to play on each other after all their spouses had passed.  Patient seem to have fond memories of her friends.

## 2020-04-20 NOTE — Plan of Care (Signed)
Discussed with patient plan of care for the evening, pain management and talked about importance of patient not taking medications from home.  Patient had imodium in room and said it was discussed during previous shift with some teach back displayed.  Requested prn medication order for imodium per patient request.

## 2020-04-20 NOTE — Progress Notes (Signed)
PROGRESS NOTE    [name]  [id number] DOB: 07-15-1951 DOA: 04/17/2020 PCP: Maurice Small, MD   Brief Narrative:  69 y.o.femalewith HTN, anxiety/depression, ER positive malignant neoplasm of the right breast undergoing chemotherapy(trastuzumab and taxol)by Dr. Payton Mccallum who presented on4/29/2021with worsening left leg swelling, redness and tenderness.   She initially noticed redness on left foot after a frozen right gross from her freezer fell onto her foot approximately 6 weeks ago. At that time she noticed redness with a bump that progressed to swelling. She was seen by her primary care physician and given a course of Keflex for about a week with little improvement followed by Bactrim with still no improvement. She underwent venous duplex on 4/6 for evaluation of her swelling by her oncologist which was negative for DVT. The swelling and redness and pain continue to worsen despite elevating legs and wearing compression hose. Repeat venous duplex on 4/27 was also again negative for DVT underwent CT abdomen and pelvis on 4/28 which was negative for any lymph node blockage or enlargement. She was started on IV ceftriaxone and IV vancomycin at the cancer center on 4/27. She received her third dose on 4/28 still no improvement in redness, swelling or pain which prompted direct admission to [facility] for further management of left lower extremity cellulitis.  On arrival to [facility] patient wasafebrile, nontoxic-appearing, hemodynamically stableand complaining of exquisite pain to left leg  Chronic diarrhea. Related to Herceptin. Takes Imodium as needed at home. Denies any nausea or vomiting.  She denies any fevers, chills, nausea, vomiting, chest pain, shortness of breath, abdominal pain, other areas of redness, swelling or tenderness. Denies any drainage redness at Port-A-Cath site     Assessment & Plan:   Active Problems:   Hypothyroidism   Malignant neoplasm  of upper-inner quadrant of right breast in female, estrogen receptor positive ([place])   Left leg cellulitis   Cellulitis of left leg   Hypokalemia   Macrocytic anemia   Anxiety   Depression   Chronic diarrhea   Chemotherapy induced diarrhea   #Nonpurulent cellulitis of left lower extremity in patient undergoing chemotherapy with failed outpatientantibiotictreatment.   -Swelling, redness, tenderness has worsened despite outpatient treatment with Keflex, followed by Bactrim, followed by recent start of vancomycin/ceftriaxone on 4/27.  X-ray shows edema, CT of the leg consistent with cellulitis with no signs of abscess or deep-seated infection -Continue empiric ceftriaxone, vancomycin, given nonpurulent nature may be able to discontinue vancomycin in 24 hours -Monitor blood cultures -Consulted ID today, will see patient tomorrow.  Patient anxious and wants to move on with current plan for chemotherapy and thinks her cellulitis is putting her off schedule.  #Right breast cancer status post right breast lumpectomy x2, currently undergoing adjuvant chemotherapy. Followed by Dr. Gardiner Fanti outpatient. Her right chest wall Port-A-Cath with no signs of infection. -Direct admit from cancer center due to above  #Hypokalemia, likely from diarrhea related to current chemotherapy (Herceptin), resolved after oral repletion. Magnesium within normal limits, has occasional nausea, no vomiting, reports diarrhea in the setting of her undergoing chemotherapy.  No diarrhea. -Monitor BMs, takes Imodium as needed at home -Monitor BMP  #Macrocytic anemia. Recent hemoglobin range 10.5-11.5. 9.3 on admission. No localizing signs or symptoms of bleeding.-Repeat CBC in a.m.  #Hypothyroidism. TSH elevated. Already on quite high dose, likely related to poor absorption if synthroid not taken alone -Continue home Synthroid 137 mcg daily  #Anxiety/depression.Stable -Diazepam as needed bedtime -Continue  home Elavil and Prozac  #Hypertension. SBP  150s. -Holding home amlodipine in setting of left lower leg swelling related to cellulitis -As needed IV hydralazine -Closely monitor   DVT prophylaxis: Heparin  Code Status: DNR Family Communication: None Disposition Plan:  Status is: Inpatient  Remains inpatient appropriate because:Inpatient level of care appropriate due to severity of illness   Dispo: The patient is from: Home              Anticipated d/c is to: Home              Anticipated d/c date is: 3 days              Patient currently is not medically stable to d/c.          Consultants:   None   Procedures: None  Antimicrobials: Vancomycin and ceftriaxone  Subjective: Placed on enteric precautions this morning secondary to loose bowel movements.  In process of ruling out C. difficile.  She is upset that the cellulitis is taking time to improve since she wants to move on with her chemotherapy.  Objective: Vitals:   04/20/20 0231 04/20/20 0454 04/20/20 0516 04/20/20 1321  BP:  (!) 149/91  (!) 172/77  Pulse:  87  82  Resp:  16  16  Temp:   (!) 97.4 F (36.3 C)   TempSrc:   Oral   SpO2:  97%  97%  Weight: 118.4 kg     Height:        Intake/Output Summary (Last 24 hours) at 04/20/2020 1429 Last data filed at 04/19/2020 2215 Gross per 24 hour  Intake 640 ml  Output --  Net 640 ml   Filed Weights   04/18/20 2322 04/19/20 0500 04/20/20 0231  Weight: 118.8 kg 118.5 kg 118.4 kg    Examination:  General exam: Appears calm and comfortable  Respiratory system: Clear to auscultation. Respiratory effort normal. Cardiovascular system: S1 & S2 heard, RRR. No JVD, murmurs, rubs, gallops or clicks. No pedal edema. Gastrointestinal system: Abdomen is nondistended, soft and nontender. No organomegaly or masses felt. Normal bowel sounds heard. Central nervous system: Alert and oriented. No focal neurological deficits. Extremities: Symmetric 5 x 5 power.   Significant asymmetric swelling of the left lower extremity with superficial redness and warmth.  No evidence of localization.  Skin: No rashes, lesions or ulcers Psychiatry: Judgement and insight appear normal. Mood & affect appropriate.     Data Reviewed: I have personally reviewed following labs and imaging studies  CBC: Recent Labs  Lab 04/15/20 0849 04/17/20 1841 04/18/20 0612 04/19/20 0605  WBC 5.2 4.1 3.9* 3.6*  NEUTROABS 3.8  --   --   --   HGB 11.5* 9.3* 10.4* 10.3*  HCT 35.6* 29.5* 32.8* 33.0*  MCV 100.0 102.8* 104.1* 105.8*  PLT 254 215 240 0000000   Basic Metabolic Panel: Recent Labs  Lab 04/15/20 0849 04/17/20 1841 04/18/20 0612 04/19/20 0605  NA 138 144 140 140  K 4.0 2.8* 3.9 4.0  CL 104 112* 107 107  CO2 24 24 26 26   GLUCOSE 134* 89 120* 118*  BUN 15 20 28* 26*  CREATININE 0.82 0.51 0.71 0.63  CALCIUM 8.9 7.0* 8.5* 8.4*  MG  --  1.7  --   --    GFR: Estimated Creatinine Clearance: 87.1 mL/min (by C-G formula based on SCr of 0.63 mg/dL). Liver Function Tests: Recent Labs  Lab 04/15/20 0849 04/17/20 1841  AST 20 23  ALT 25 22  ALKPHOS 80 51  BILITOT 0.4  0.5  PROT 6.3* 5.4*  ALBUMIN 3.3* 3.0*   No results for input(s): LIPASE, AMYLASE in the last 168 hours. No results for input(s): AMMONIA in the last 168 hours. Coagulation Profile: No results for input(s): INR, PROTIME in the last 168 hours. Cardiac Enzymes: No results for input(s): CKTOTAL, CKMB, CKMBINDEX, TROPONINI in the last 168 hours. BNP (last 3 results) No results for input(s): PROBNP in the last 8760 hours. HbA1C: No results for input(s): HGBA1C in the last 72 hours. CBG: No results for input(s): GLUCAP in the last 168 hours. Lipid Profile: No results for input(s): CHOL, HDL, LDLCALC, TRIG, CHOLHDL, LDLDIRECT in the last 72 hours. Thyroid Function Tests: Recent Labs    04/17/20 2038  TSH 30.410*   Anemia Panel: No results for input(s): VITAMINB12, FOLATE, FERRITIN, TIBC,  IRON, RETICCTPCT in the last 72 hours. Sepsis Labs: No results for input(s): PROCALCITON, LATICACIDVEN in the last 168 hours.  Recent Results (from the past 240 hour(s))  Culture, blood (routine x 2)     Status: None (Preliminary result)   Collection Time: 04/17/20  6:21 PM   Specimen: BLOOD  Result Value Ref Range Status   Specimen Description   Final    BLOOD LEFT ANTECUBITAL Performed at [facility] [address], [place], [place] [address]    Special Requests   Final    BOTTLES DRAWN AEROBIC AND ANAEROBIC Blood Culture results may not be optimal due to an excessive volume of blood received in culture bottles Performed at [facility] [address] [place], [place] [address]    Culture   Final    NO GROWTH 3 DAYS Performed at [facility], [place] [address], [place], [place] [address]    Report Status PENDING  Incomplete  Culture, blood (routine x 2)     Status: None (Preliminary result)   Collection Time: 04/17/20  6:21 PM   Specimen: BLOOD  Result Value Ref Range Status   Specimen Description   Final    BLOOD RIGHT ANTECUBITAL Performed at [facility] [address], [place], [place] [address]    Special Requests   Final    BOTTLES DRAWN AEROBIC AND ANAEROBIC Blood Culture results may not be optimal due to an excessive volume of blood received in culture bottles Performed at [facility] [address] [place], [place] [address]    Culture   Final    NO GROWTH 3 DAYS Performed at [facility], [place] [address], [place], [place] [address]    Report Status PENDING  Incomplete  SARS CORONAVIRUS 2 (TAT 6-24 HRS) Nasopharyngeal Nasopharyngeal Swab     Status: None   Collection Time: 04/18/20  6:12 AM   Specimen: Nasopharyngeal Swab  Result Value Ref Range Status   SARS Coronavirus 2 NEGATIVE NEGATIVE Final    Comment: (NOTE) SARS-CoV-2 target nucleic acids are NOT DETECTED. The  SARS-CoV-2 RNA is generally detectable in upper and lower respiratory specimens during the acute phase of infection. Negative results do not preclude SARS-CoV-2 infection, do not rule out co-infections with other pathogens, and should not be used as the sole basis for treatment or other patient management decisions. Negative results must be combined with clinical observations, patient history, and epidemiological information. The expected result is Negative. Fact Sheet for Patients: SugarRoll.be Fact Sheet for Healthcare Providers: https://www.woods-mathews.com/ This test is not yet approved or cleared by the [place] FDA and  has been authorized for  detection and/or diagnosis of SARS-CoV-2 by FDA under an Emergency Use Authorization (EUA). This EUA will remain  in effect (meaning this test can be used) for the duration of the COVID-19 declaration under Section 56 4(b)(1) of the Act, 21 U.S.C. section 360bbb-3(b)(1), unless the authorization is terminated or revoked sooner. Performed at [facility], [place] [address], [place], [place] [address]          Radiology Studies: No results found.      Scheduled Meds: . amitriptyline  75 mg Oral QHS  . Chlorhexidine Gluconate Cloth  6 each Topical Daily  . docusate sodium  100 mg Oral BID  . FLUoxetine  80 mg Oral QHS  . heparin  5,000 Units Subcutaneous Q8H  . levothyroxine  137 mcg Oral QHS  . loperamide  2 mg Oral Once   Continuous Infusions: . cefTRIAXone (ROCEPHIN)  IV 2 g (04/20/20 1116)  . vancomycin Stopped (04/19/20 2215)     LOS: 3 days    Time spent: 70 min    Twilla Khouri, MD [facility]   To contact the attending provider between 7A-7P or the covering provider during after hours 7P-7A, please log into the web site [email or web address] and access using universal [place] password for that web site. If you do not have the password, please call  the hospital operator.  04/20/2020, 2:29 PM

## 2020-04-20 NOTE — Plan of Care (Signed)
Discussed with patient plan of care for the evening, pain management and coping with fatigue and stress with some teach back displayed

## 2020-04-21 DIAGNOSIS — C50211 Malignant neoplasm of upper-inner quadrant of right female breast: Secondary | ICD-10-CM

## 2020-04-21 DIAGNOSIS — D6481 Anemia due to antineoplastic chemotherapy: Secondary | ICD-10-CM

## 2020-04-21 DIAGNOSIS — I10 Essential (primary) hypertension: Secondary | ICD-10-CM | POA: Diagnosis not present

## 2020-04-21 DIAGNOSIS — L03116 Cellulitis of left lower limb: Secondary | ICD-10-CM | POA: Diagnosis not present

## 2020-04-21 DIAGNOSIS — F418 Other specified anxiety disorders: Secondary | ICD-10-CM

## 2020-04-21 DIAGNOSIS — E039 Hypothyroidism, unspecified: Secondary | ICD-10-CM

## 2020-04-21 LAB — CBC WITH DIFFERENTIAL/PLATELET
Abs Immature Granulocytes: 0.06 10*3/uL (ref 0.00–0.07)
Basophils Absolute: 0 10*3/uL (ref 0.0–0.1)
Basophils Relative: 1 %
Eosinophils Absolute: 0.1 10*3/uL (ref 0.0–0.5)
Eosinophils Relative: 2 %
HCT: 28.3 % — ABNORMAL LOW (ref 36.0–46.0)
Hemoglobin: 8.9 g/dL — ABNORMAL LOW (ref 12.0–15.0)
Immature Granulocytes: 1 %
Lymphocytes Relative: 20 %
Lymphs Abs: 0.9 10*3/uL (ref 0.7–4.0)
MCH: 32.8 pg (ref 26.0–34.0)
MCHC: 31.4 g/dL (ref 30.0–36.0)
MCV: 104.4 fL — ABNORMAL HIGH (ref 80.0–100.0)
Monocytes Absolute: 0.2 10*3/uL (ref 0.1–1.0)
Monocytes Relative: 5 %
Neutro Abs: 3.1 10*3/uL (ref 1.7–7.7)
Neutrophils Relative %: 71 %
Platelets: 215 10*3/uL (ref 150–400)
RBC: 2.71 MIL/uL — ABNORMAL LOW (ref 3.87–5.11)
RDW: 17.1 % — ABNORMAL HIGH (ref 11.5–15.5)
WBC: 4.3 10*3/uL (ref 4.0–10.5)
nRBC: 0 % (ref 0.0–0.2)

## 2020-04-21 LAB — BASIC METABOLIC PANEL
Anion gap: 5 (ref 5–15)
BUN: 19 mg/dL (ref 8–23)
CO2: 25 mmol/L (ref 22–32)
Calcium: 7.4 mg/dL — ABNORMAL LOW (ref 8.9–10.3)
Chloride: 110 mmol/L (ref 98–111)
Creatinine, Ser: 0.39 mg/dL — ABNORMAL LOW (ref 0.44–1.00)
GFR calc Af Amer: >60 mL/min (ref >60)
GFR calc non Af Amer: >60 mL/min (ref >60)
Glucose, Bld: 93 mg/dL (ref 70–99)
Potassium: 3.1 mmol/L — ABNORMAL LOW (ref 3.5–5.1)
Sodium: 140 mmol/L (ref 135–145)

## 2020-04-21 MED ORDER — CEFAZOLIN SODIUM-DEXTROSE 2-4 GM/100ML-% IV SOLN
2.0000 g | Freq: Three times a day (TID) | INTRAVENOUS | Status: AC
  Administered 2020-04-21 – 2020-04-24 (×9): 2 g via INTRAVENOUS
  Filled 2020-04-21 (×9): qty 100

## 2020-04-21 MED ORDER — POTASSIUM CHLORIDE CRYS ER 20 MEQ PO TBCR
40.0000 meq | EXTENDED_RELEASE_TABLET | ORAL | Status: AC
  Administered 2020-04-21: 40 meq via ORAL
  Filled 2020-04-21: qty 2

## 2020-04-21 NOTE — Progress Notes (Signed)
Labs redrawn and sent down by CN as witness.

## 2020-04-21 NOTE — Consult Note (Signed)
[facility] for Infectious Disease  Total days of antibiotics                Reason for Consult: cellulitis   Referring Physician: [email or web address]  Active Problems:   Hypothyroidism   Malignant neoplasm of upper-inner quadrant of right breast in female, estrogen receptor positive ([place])   Left leg cellulitis   Cellulitis of left leg   Hypokalemia   Macrocytic anemia   Anxiety   Depression   Chronic diarrhea   Chemotherapy induced diarrhea    HPI: [name] is a 69 y.o. female with history of breast ca, who was admitted for new onset of left leg erythema, swelling and pain after sustaining abrasion from potroast falling on her foot. She denies systemic signs. On admit, she was started on vancomycin and ceftriaxone, she feels that some swelling and erythema has improved, but still exquisitely tender. No pain to her port site.  Past Medical History:  Diagnosis Date  . Anxiety   . Cancer (HCC)    breast  . Chronic pain   . Concussion 3/08   ICU x 3 days  . Depression   . Hypertension   . Hypothyroidism   . Spinal stenosis   . Thyroid disease ?1994    Allergies:  Allergies  Allergen Reactions  . Ambien [Zolpidem Tartrate] Other (See Comments)    Hallucinations and Confusion     MEDICATIONS: . amitriptyline  75 mg Oral QHS  . Chlorhexidine Gluconate Cloth  6 each Topical Daily  . docusate sodium  100 mg Oral BID  . FLUoxetine  80 mg Oral QHS  . heparin  5,000 Units Subcutaneous Q8H  . levothyroxine  137 mcg Oral QHS  . loperamide  2 mg Oral Once  . potassium chloride  40 mEq Oral Q2H    Social History   Tobacco Use  . Smoking status: Former Smoker    Types: Cigarettes  . Smokeless tobacco: Never Used  Substance Use Topics  . Alcohol use: Yes    Alcohol/week: 5.0 standard drinks    Types: 5 Standard drinks or equivalent per week    Comment: wine  . Drug use: No    Family History  Problem Relation Age of Onset  . Thyroid disease Mother   .  Dementia Mother   . Diabetes Father   . Stroke Father   . Hypertension Sister   . Diabetes Sister     Review of Systems -  12 point ros is negative except what is mentioned above  OBJECTIVE: Temp:  [97.7 F (36.5 C)-98.4 F (36.9 C)] 97.7 F (36.5 C) (05/03 1339) Pulse Rate:  [77-87] 87 (05/03 1339) Resp:  [16-18] 18 (05/03 1339) BP: (136-163)/(75-106) 136/106 (05/03 1339) SpO2:  [94 %-98 %] 98 % (05/03 1339) Weight:  [119.9 kg] 119.9 kg (05/03 0312) Physical Exam  Constitutional:  oriented to person, place, and time. appears well-developed and well-nourished. No distress.  HENT: [place]/AT, PERRLA, no scleral icterus Mouth/Throat: Oropharynx is clear and moist. No oropharyngeal exudate.  Cardiovascular: Normal rate, regular rhythm and normal heart sounds. Exam reveals no gallop and no friction rub.  No murmur heard.  Chest wall: right port a cath is in place not tender Pulmonary/Chest: Effort normal and breath sounds normal. No respiratory distress.  has no wheezes.  Neck = supple, no nuchal rigidity Abdominal: Soft. Bowel sounds are normal.  exhibits no distension. There is no tenderness.  Lymphadenopathy: no cervical adenopathy. No axillary adenopathy Ext:  left leg swelling, blanching erythema, knee is spared. Neurological: alert and oriented to person, place, and time.  Skin: Skin is warm and dry. No rash noted. No erythema.  Psychiatric: a normal mood and affect.  behavior is normal.    LABS: Results for orders placed or performed during the hospital encounter of 04/17/20 (from the past 48 hour(s))  Basic metabolic panel     Status: Abnormal   Collection Time: 04/20/20  2:29 PM  Result Value Ref Range   Sodium 142 135 - 145 mmol/L   Potassium 3.0 (L) 3.5 - 5.1 mmol/L    Comment: DELTA CHECK NOTED   Chloride 111 98 - 111 mmol/L   CO2 25 22 - 32 mmol/L   Glucose, Bld 116 (H) 70 - 99 mg/dL    Comment: Glucose reference range applies only to samples taken after fasting for  at least 8 hours.   BUN 20 8 - 23 mg/dL   Creatinine, Ser 0.52 0.44 - 1.00 mg/dL   Calcium 7.6 (L) 8.9 - 10.3 mg/dL   GFR calc non Af Amer >60 >60 mL/min   GFR calc Af Amer >60 >60 mL/min   Anion gap 6 5 - 15    Comment: Performed at [facility], [place] [address], [place], [place] [id number]  Basic metabolic panel     Status: Abnormal   Collection Time: 04/21/20 11:22 AM  Result Value Ref Range   Sodium 140 135 - 145 mmol/L   Potassium 3.1 (L) 3.5 - 5.1 mmol/L   Chloride 110 98 - 111 mmol/L   CO2 25 22 - 32 mmol/L   Glucose, Bld 93 70 - 99 mg/dL    Comment: Glucose reference range applies only to samples taken after fasting for at least 8 hours.   BUN 19 8 - 23 mg/dL   Creatinine, Ser 0.39 (L) 0.44 - 1.00 mg/dL   Calcium 7.4 (L) 8.9 - 10.3 mg/dL   GFR calc non Af Amer >60 >60 mL/min   GFR calc Af Amer >60 >60 mL/min   Anion gap 5 5 - 15    Comment: Performed at [facility], [place] [address], [place], [place] [address]  CBC with Differential/Platelet     Status: Abnormal   Collection Time: 04/21/20 11:22 AM  Result Value Ref Range   WBC 4.3 4.0 - 10.5 K/uL   RBC 2.71 (L) 3.87 - 5.11 MIL/uL   Hemoglobin 8.9 (L) 12.0 - 15.0 g/dL   HCT 28.3 (L) 36.0 - 46.0 %   MCV 104.4 (H) 80.0 - 100.0 fL   MCH 32.8 26.0 - 34.0 pg   MCHC 31.4 30.0 - 36.0 g/dL   RDW 17.1 (H) 11.5 - 15.5 %   Platelets 215 150 - 400 K/uL   nRBC 0.0 0.0 - 0.2 %   Neutrophils Relative % 71 %   Neutro Abs 3.1 1.7 - 7.7 K/uL   Lymphocytes Relative 20 %   Lymphs Abs 0.9 0.7 - 4.0 K/uL   Monocytes Relative 5 %   Monocytes Absolute 0.2 0.1 - 1.0 K/uL   Eosinophils Relative 2 %   Eosinophils Absolute 0.1 0.0 - 0.5 K/uL   Basophils Relative 1 %   Basophils Absolute 0.0 0.0 - 0.1 K/uL   Immature Granulocytes 1 %   Abs Immature Granulocytes 0.06 0.00 - 0.07 K/uL    Comment: Performed at [facility], [place] [address], [place], [place] [address]     MICRO: reviewed  IMAGING: Ct shows no abscess  Assessment/Plan:  69yo F with history of breast cancer presents with non purulent cellulitis to left leg, quick onset, suspect that it is streptococcal infection. - will narrow abtx to cefazolin 2gm IV q 8hr for additional 3 days or when ready to discharge can change to cephalexin 500mg  QID - pain management. Defer to primary team, she reports still poor pain control - recommended to elevated leg while in bed

## 2020-04-21 NOTE — Progress Notes (Signed)
PROGRESS NOTE    [name]  [id number] DOB: Dec 17, 1951 DOA: 04/17/2020 PCP: Maurice Small, MD   Brief Narrative:  69 y.o.femalewith HTN, anxiety/depression, ER positive malignant neoplasm of the right breast undergoing chemotherapy(trastuzumab and taxol)by Dr. Payton Mccallum who presented on4/29/2021with worsening left leg swelling, redness and tenderness.   She initially noticed redness on left foot after a frozen right gross from her freezer fell onto her foot approximately 6 weeks ago. At that time she noticed redness with a bump that progressed to swelling. She was seen by her primary care physician and given a course of Keflex for about a week with little improvement followed by Bactrim with still no improvement. She underwent venous duplex on 4/6 for evaluation of her swelling by her oncologist which was negative for DVT. The swelling and redness and pain continue to worsen despite elevating legs and wearing compression hose. Repeat venous duplex on 4/27 was also again negative for DVT underwent CT abdomen and pelvis on 4/28 which was negative for any lymph node blockage or enlargement. She was started on IV ceftriaxone and IV vancomycin at the cancer center on 4/27. She received her third dose on 4/28 still no improvement in redness, swelling or pain which prompted direct admission to [facility] long for further management of left lower extremity cellulitis.  On arrival to [facility] long patient wasafebrile, nontoxic-appearing, hemodynamically stableand complaining of exquisite pain to left leg  Chronic diarrhea. Related to Herceptin. Takes Imodium as needed at home. Denies any nausea or vomiting.  She denies any fevers, chills, nausea, vomiting, chest pain, shortness of breath, abdominal pain, other areas of redness, swelling or tenderness. Denies any drainage redness at Port-A-Cath site     Assessment & Plan:   Active Problems:   Hypothyroidism   Malignant neoplasm  of upper-inner quadrant of right breast in female, estrogen receptor positive ([place])   Left leg cellulitis   Cellulitis of left leg   Hypokalemia   Macrocytic anemia   Anxiety   Depression   Chronic diarrhea   Chemotherapy induced diarrhea   #Nonpurulent cellulitis of left lower extremity in patient undergoing chemotherapy with failed outpatientantibiotictreatment.   -Swelling, redness, tenderness has worsened despite outpatient treatment with Keflex, followed by Bactrim, followed by recent start of vancomycin/ceftriaxone on 4/27.  X-ray shows edema, CT of the leg consistent with cellulitis with no signs of abscess or deep-seated infection  -Monitor blood cultures -Consulted ID today, recommended changing Vanco ceftriaxone to cefazolin 2 g every 8.  Along with leg elevation.  #Right breast cancer status post right breast lumpectomy x2, currently undergoing adjuvant chemotherapy. Followed by Dr. Gardiner Fanti outpatient. Her right chest wall Port-A-Cath with no signs of infection. -Direct admit from cancer center due to above  #Hypokalemia, likely from diarrhea related to current chemotherapy (Herceptin), resolved after oral repletion. Magnesium within normal limits, has occasional nausea, no vomiting, reports diarrhea in the setting of her undergoing chemotherapy.  No diarrhea. -Monitor BMs, takes Imodium as needed at home -Monitor BMP  #Macrocytic anemia. Recent hemoglobin range 10.5-11.5. 9.3 on admission. No localizing signs or symptoms of bleeding.-Repeat CBC in a.m.  #Hypothyroidism. TSH elevated. Already on quite high dose, likely related to poor absorption if synthroid not taken alone -Continue home Synthroid 137 mcg daily  #Anxiety/depression.Stable -Diazepam as needed bedtime -Continue home Elavil and Prozac  #Hypertension. SBP 150s. -Holding home amlodipine in setting of left lower leg swelling related to cellulitis -As needed IV hydralazine -Closely  monitor   DVT prophylaxis: Heparin  Code Status: DNR Family Communication: None Disposition Plan:  Status is: Inpatient  Remains inpatient appropriate because:Inpatient level of care appropriate due to severity of illness   Dispo: The patient is from: Home              Anticipated d/c is to: Home              Anticipated d/c date is: 3 days              Patient currently is not medically stable to d/c.          Consultants:   None   Procedures: None  Antimicrobials: Vancomycin and ceftriaxone  Subjective: Placed on enteric precautions this morning secondary to loose bowel movements.  In process of ruling out C. difficile.  She is upset that the cellulitis is taking time to improve since she wants to move on with her chemotherapy.  Objective: Vitals:   04/20/20 1940 04/21/20 0312 04/21/20 0316 04/21/20 1339  BP: (!) 163/84  (!) 151/75 (!) 136/106  Pulse: 78  77 87  Resp: 16  16 18   Temp: 98.4 F (36.9 C)  98.1 F (36.7 C) 97.7 F (36.5 C)  TempSrc: Oral  Oral Oral  SpO2: 94%  95% 98%  Weight:  119.9 kg    Height:        Intake/Output Summary (Last 24 hours) at 04/21/2020 1819 Last data filed at 04/21/2020 1300 Gross per 24 hour  Intake 1480 ml  Output 750 ml  Net 730 ml   Filed Weights   04/19/20 0500 04/20/20 0231 04/21/20 0312  Weight: 118.5 kg 118.4 kg 119.9 kg    Examination:  General exam: Appears calm and comfortable  Respiratory system: Clear to auscultation. Respiratory effort normal. Cardiovascular system: S1 & S2 heard, RRR. No JVD, murmurs, rubs, gallops or clicks. No pedal edema. Gastrointestinal system: Abdomen is nondistended, soft and nontender. No organomegaly or masses felt. Normal bowel sounds heard. Central nervous system: Alert and oriented. No focal neurological deficits. Extremities: Symmetric 5 x 5 power.  Significant asymmetric swelling of the left lower extremity with superficial redness and warmth.  No evidence of  localization.  Skin: No rashes, lesions or ulcers Psychiatry: Judgement and insight appear normal. Mood & affect appropriate.     Data Reviewed: I have personally reviewed following labs and imaging studies  CBC: Recent Labs  Lab 04/15/20 0849 04/17/20 1841 04/18/20 0612 04/19/20 0605 04/21/20 1122  WBC 5.2 4.1 3.9* 3.6* 4.3  NEUTROABS 3.8  --   --   --  3.1  HGB 11.5* 9.3* 10.4* 10.3* 8.9*  HCT 35.6* 29.5* 32.8* 33.0* 28.3*  MCV 100.0 102.8* 104.1* 105.8* 104.4*  PLT 254 215 240 249 [id number]   Basic Metabolic Panel: Recent Labs  Lab 04/17/20 1841 04/18/20 0612 04/19/20 0605 04/20/20 1429 04/21/20 1122  NA 144 140 140 142 140  K 2.8* 3.9 4.0 3.0* 3.1*  CL 112* 107 107 111 110  CO2 24 26 26 25 25   GLUCOSE 89 120* 118* 116* 93  BUN 20 28* 26* 20 19  CREATININE 0.51 0.71 0.63 0.52 0.39*  CALCIUM 7.0* 8.5* 8.4* 7.6* 7.4*  MG 1.7  --   --   --   --    GFR: Estimated Creatinine Clearance: 87.7 mL/min (A) (by C-G formula based on SCr of 0.39 mg/dL (L)). Liver Function Tests: Recent Labs  Lab 04/15/20 0849 04/17/20 1841  AST 20 23  ALT 25 22  ALKPHOS 80  51  BILITOT 0.4 0.5  PROT 6.3* 5.4*  ALBUMIN 3.3* 3.0*   No results for input(s): LIPASE, AMYLASE in the last 168 hours. No results for input(s): AMMONIA in the last 168 hours. Coagulation Profile: No results for input(s): INR, PROTIME in the last 168 hours. Cardiac Enzymes: No results for input(s): CKTOTAL, CKMB, CKMBINDEX, TROPONINI in the last 168 hours. BNP (last 3 results) No results for input(s): PROBNP in the last 8760 hours. HbA1C: No results for input(s): HGBA1C in the last 72 hours. CBG: No results for input(s): GLUCAP in the last 168 hours. Lipid Profile: No results for input(s): CHOL, HDL, LDLCALC, TRIG, CHOLHDL, LDLDIRECT in the last 72 hours. Thyroid Function Tests: No results for input(s): TSH, T4TOTAL, FREET4, T3FREE, THYROIDAB in the last 72 hours. Anemia Panel: No results for input(s):  VITAMINB12, FOLATE, FERRITIN, TIBC, IRON, RETICCTPCT in the last 72 hours. Sepsis Labs: No results for input(s): PROCALCITON, LATICACIDVEN in the last 168 hours.  Recent Results (from the past 240 hour(s))  Culture, blood (routine x 2)     Status: None (Preliminary result)   Collection Time: 04/17/20  6:21 PM   Specimen: BLOOD  Result Value Ref Range Status   Specimen Description   Final    BLOOD LEFT ANTECUBITAL Performed at [facility] [address] [place], [place] [address]    Special Requests   Final    BOTTLES DRAWN AEROBIC AND ANAEROBIC Blood Culture results may not be optimal due to an excessive volume of blood received in culture bottles Performed at [facility] [address], [place], [place] [address]    Culture   Final    NO GROWTH 4 DAYS Performed at [facility], [place] [address], [place], [place] [address]    Report Status PENDING  Incomplete  Culture, blood (routine x 2)     Status: None (Preliminary result)   Collection Time: 04/17/20  6:21 PM   Specimen: BLOOD  Result Value Ref Range Status   Specimen Description   Final    BLOOD RIGHT ANTECUBITAL Performed at [facility] [address] [place], [place] [address]    Special Requests   Final    BOTTLES DRAWN AEROBIC AND ANAEROBIC Blood Culture results may not be optimal due to an excessive volume of blood received in culture bottles Performed at [facility] [address] [place], [place] [address]    Culture   Final    NO GROWTH 4 DAYS Performed at [facility], [place] [address], [place], [place] [address]    Report Status PENDING  Incomplete  SARS CORONAVIRUS 2 (TAT 6-24 HRS) Nasopharyngeal Nasopharyngeal Swab     Status: None   Collection Time: 04/18/20  6:12 AM   Specimen: Nasopharyngeal Swab  Result Value Ref Range Status   SARS Coronavirus 2 NEGATIVE NEGATIVE Final    Comment: (NOTE) SARS-CoV-2 target  nucleic acids are NOT DETECTED. The SARS-CoV-2 RNA is generally detectable in upper and lower respiratory specimens during the acute phase of infection. Negative results do not preclude SARS-CoV-2 infection, do not rule out co-infections with other pathogens, and should not be used as the sole basis for treatment or other patient management decisions. Negative results must be combined with clinical observations, patient history, and epidemiological information. The expected result is Negative. Fact Sheet for Patients: SugarRoll.be Fact Sheet for Healthcare Providers: https://www.woods-mathews.com/ This test is not yet approved or cleared by the [place] FDA  and  has been authorized for detection and/or diagnosis of SARS-CoV-2 by FDA under an Emergency Use Authorization (EUA). This EUA will remain  in effect (meaning this test can be used) for the duration of the COVID-19 declaration under Section 56 4(b)(1) of the Act, 21 U.S.C. section 360bbb-3(b)(1), unless the authorization is terminated or revoked sooner. Performed at [facility], [place] [address] [place], [place] [address]          Radiology Studies: No results found.      Scheduled Meds: . amitriptyline  75 mg Oral QHS  . Chlorhexidine Gluconate Cloth  6 each Topical Daily  . docusate sodium  100 mg Oral BID  . FLUoxetine  80 mg Oral QHS  . heparin  5,000 Units Subcutaneous Q8H  . levothyroxine  137 mcg Oral QHS  . loperamide  2 mg Oral Once  . potassium chloride  40 mEq Oral Q2H   Continuous Infusions: .  ceFAZolin (ANCEF) IV       LOS: 4 days    Time spent: 35 min    Biagio Snelson, MD [facility]   To contact the attending provider between 7A-7P or the covering provider during after hours 7P-7A, please log into the web site [email or web address] and access using universal [place] password for that web site. If you do not have the  password, please call the hospital operator.  04/21/2020, 6:19 PM

## 2020-04-21 NOTE — Progress Notes (Signed)
Oncology: S: Patient has been admitted to the hospital with left leg swelling redness pain suspicious for cellulitis.  Prior to this we had performed ultrasounds which were negative for DVT and a CT of the abdomen which did not show any compression of vasculature.  She is currently receiving antibiotics with Rocephin and she tells me that the swelling has come down a bit but she continues to have severe pain on the shin.  She is requiring Percocets for pain relief.  O: Left leg swelling with pitting edema redness and warmth. Vitals:   04/20/20 1940 04/21/20 0316  BP: (!) 163/84 (!) 151/75  Pulse: 78 77  Resp: 16 16  Temp: 98.4 F (36.9 C) 98.1 F (36.7 C)  SpO2: 94% 95%    Plan: 1.  Cellulitis left leg: On antibiotics.  Infectious disease has been consulted. 2.  Right breast cancer treated with lumpectomy ER/PR positive HER-2 positive with a Ki-67 of 30%.  Currently on Taxol with Herceptin.  She completed 8 cycles of Taxol. I discussed with her that we will hold off on tomorrow's chemo. We may elect to discontinue further Taxol treatments but I will do so after reassessment in a week.  She can then go on maintenance Herceptin and adjuvant radiation followed by adjuvant antiestrogen therapy.  Follow-up in 1 week with me.

## 2020-04-21 NOTE — Plan of Care (Signed)
Discussed with patient plan of care for the evening, pain management and bedtime routine with some teach back displayed 

## 2020-04-21 NOTE — Progress Notes (Addendum)
HEMATOLOGY-ONCOLOGY PROGRESS NOTE  SUBJECTIVE: The patient was admitted due to worsening cellulitis in her left lower extremity.  She had been tried on oral antibiotics-initially Keflex and then Bactrim with no improvement.  She was also started on IV ceftriaxone and IV vancomycin at the cancer on 04/15/2020.  She was still not having much improvement and therefore was sent to the hospital for direct admission.  When seen today, the patient reports ongoing pain to her left lower extremity.  She feels as though the redness and swelling have improved somewhat.  Remains on IV ceftriaxone and vancomycin.  Afebrile.  Oncology History  Malignant neoplasm of upper-inner quadrant of right breast in female, estrogen receptor positive ([place])  12/26/2019 Initial Diagnosis   Patient palpated a right breast lump x1wk. Mammogram and US showed two adjacent masses at the 2 o'clock position measuring 1.4cm and 0.6cm, calcifications in the outer right breast at the 9 o'clock position, no right axillary adenopathy. Biopsy showed IDC at the 2 o'clock position, grade 3, HER-2 + (3+), ER+ 90%, PR+ 30%, Ki67 30%, and DCIS in the upper outer right breast, high grade, ER+ 95%, PR 90%.   01/22/2020 Surgery   Right breast lumpectomy x2 ([facility]): Medial position: IDC, grade 3, 2.3cm, with high grade DCIS, clear margins Lateral position: high grade DCIS, clear margins, 4 right axillary lymph nodes negative    01/22/2020 Cancer Staging   Staging form: Breast, AJCC 8th Edition - Pathologic stage from 01/22/2020: Stage IA (pT2, pN0, cM0, G3, ER+, PR+, HER2+) - Signed by Gardenia Phlegm, NP on 02/06/2020   02/19/2020 -  Chemotherapy   The patient had PACLitaxel (TAXOL) 180 mg in sodium chloride 0.9 % 250 mL chemo infusion (</= 34m/m2), 80 mg/m2 = 180 mg, Intravenous,  Once, 3 of 3 cycles Dose modification: 65 mg/m2 (original dose 80 mg/m2, Cycle 2, Reason: Dose not tolerated), 65 mg/m2 (original dose 80 mg/m2, Cycle 2, Reason:  Dose not tolerated) Administration: 180 mg (02/19/2020), 180 mg (02/26/2020), 180 mg (03/18/2020), 180 mg (03/04/2020), 180 mg (03/11/2020), 144 mg (03/25/2020), 144 mg (04/01/2020), 144 mg (04/08/2020), 144 mg (04/15/2020) trastuzumab-anns (KANJINTI) 441 mg in sodium chloride 0.9 % 250 mL chemo infusion, 4 mg/kg = 441 mg (100 % of original dose 4 mg/kg), Intravenous,  Once, 3 of 16 cycles Dose modification: 4 mg/kg (original dose 4 mg/kg, Cycle 1, Reason: Other (see comments)), 6 mg/kg (original dose 6 mg/kg, Cycle 4, Reason: Other (see comments), Comment: insurance preferred biosimilar), 6 mg/kg (original dose 6 mg/kg, Cycle 10, Reason: Other (see comments)) Administration: 441 mg (02/19/2020), 210 mg (02/26/2020), 210 mg (03/04/2020), 210 mg (03/11/2020), 210 mg (03/18/2020), 210 mg (03/25/2020), 210 mg (04/01/2020), 210 mg (04/08/2020), 210 mg (04/15/2020)  for chemotherapy treatment.       REVIEW OF SYSTEMS:   Constitutional: Denies fevers, chills Eyes: Denies blurriness of vision Ears, nose, mouth, throat, and face: Denies mucositis or sore throat Respiratory: Denies cough, dyspnea or wheezes Cardiovascular: Denies palpitation, chest discomfort Gastrointestinal:  Denies nausea, heartburn or change in bowel habits Skin: Redness and swelling to her left lower extremity with pain Lymphatics: Denies new lymphadenopathy or easy bruising Neurological:Denies numbness, tingling or new weaknesses Behavioral/Psych: Mood is stable, no new changes  Extremities: No lower extremity edema All other systems were reviewed with the patient and are negative.  I have reviewed the past medical history, past surgical history, social history and family history with the patient and they are unchanged from previous note.   PHYSICAL EXAMINATION: ECOG  PERFORMANCE STATUS: 1 - Symptomatic but completely ambulatory  Vitals:   04/20/20 1940 04/21/20 0316  BP: (!) 163/84 (!) 151/75  Pulse: 78 77  Resp: 16 16  Temp: 98.4 F (36.9  C) 98.1 F (36.7 C)  SpO2: 94% 95%   Filed Weights   04/19/20 0500 04/20/20 0231 04/21/20 0312  Weight: 118.5 kg 118.4 kg 119.9 kg    Intake/Output from previous day: 05/02 0701 - 05/03 0700 In: 880 [P.O.:480; IV Piggyback:400] Out: 500 [Urine:500]  GENERAL:alert, no distress and comfortable SKIN: Warmth and erythema of the left lower extremity LUNGS: clear to auscultation and percussion with normal breathing effort HEART: regular rate & rhythm and no murmurs, no edema to the right lower extremity, left lower extremity with 2+ edema ABDOMEN:abdomen soft, non-tender and normal bowel sounds Musculoskeletal:no cyanosis of digits and no clubbing  NEURO: alert & oriented x 3 with fluent speech, no focal motor/sensory deficits  LABORATORY DATA:  I have reviewed the data as listed CMP Latest Ref Rng & Units 04/21/2020 04/20/2020 04/19/2020  Glucose 70 - 99 mg/dL 93 116(H) 118(H)  BUN 8 - 23 mg/dL 19 20 26(H)  Creatinine 0.44 - 1.00 mg/dL 0.39(L) 0.52 0.63  Sodium 135 - 145 mmol/L 140 142 140  Potassium 3.5 - 5.1 mmol/L 3.1(L) 3.0(L) 4.0  Chloride 98 - 111 mmol/L 110 111 107  CO2 22 - 32 mmol/L _0 Calcium 8.9 - 10.3 mg/dL 7.4(L) 7.6(L) 8.4(L)  Total Protein 6.5 - 8.1 g/dL - - -  Total Bilirubin 0.3 - 1.2 mg/dL - - -  Alkaline Phos 38 - 126 U/L - - -  AST 15 - 41 U/L - - -  ALT 0 - 44 U/L - - -    Lab Results  Component Value Date   WBC 4.3 04/21/2020   HGB 8.9 (L) 04/21/2020   HCT 28.3 (L) 04/21/2020   MCV 104.4 (H) 04/21/2020   PLT 215 04/21/2020   NEUTROABS 3.1 04/21/2020    DG Tibia/Fibula Left  Result Date: 04/17/2020 CLINICAL DATA:  Cellulitis EXAM: LEFT TIBIA AND FIBULA - 2 VIEW COMPARISON:  None. FINDINGS: No fracture or malalignment. No periostitis or bone destruction. Diffuse soft tissue edema without emphysema. IMPRESSION: Soft tissue edema.  No acute osseous abnormality Electronically Signed   By: Donavan Foil M.D.   On: 04/17/2020 20:33   CT Abdomen Pelvis  W Contrast  Result Date: 04/16/2020 CLINICAL DATA:  Breast carcinoma. Currently undergoing chemotherapy. Unilateral left leg swelling for 8 weeks. Diarrhea. EXAM: CT ABDOMEN AND PELVIS WITH CONTRAST TECHNIQUE: Multidetector CT imaging of the abdomen and pelvis was performed using the standard protocol following bolus administration of intravenous contrast. CONTRAST:  171m OMNIPAQUE IOHEXOL 300 MG/ML  SOLN COMPARISON:  None. FINDINGS: Lower Chest: No acute findings. Hepatobiliary: Mild diffuse hepatic steatosis. Subcentimeter low-attenuation lesion in the anterior right lobe has nonspecific characteristics and is difficult to characterize due to its small size. No other liver lesions identified. At least 1 gallstone is seen measuring 2 cm, however there is no evidence of cholecystitis or biliary ductal dilatation. Pancreas:  No mass or inflammatory changes. Spleen: Within normal limits in size and appearance. Adrenals/Urinary Tract: 2 cm homogeneous left adrenal mass is seen which has nonspecific characteristics. No renal masses identified. No evidence of ureteral calculi or hydronephrosis. Unremarkable unopacified urinary bladder. Stomach/Bowel: No evidence of obstruction, inflammatory process or abnormal fluid collections. Normal appendix visualized. Diverticulosis is seen mainly involving the sigmoid colon, however there is no  evidence of diverticulitis. Vascular/Lymphatic: No pathologically enlarged lymph nodes. No abdominal aortic aneurysm. No evidence of IVC iliac vein thrombosis. Reproductive: Fibroid seen in the right uterine fundus measuring 4.3 cm. Adnexal regions are unremarkable. Other:  None. Musculoskeletal: No suspicious bone lesions identified. Lumbar spine fusion hardware noted at L4-5. IMPRESSION: 1. 2 cm indeterminate left adrenal mass, and sub-cm indeterminate liver lesion. Abdomen MRI without and with contrast is recommended for further characterization. 2. Mild hepatic steatosis. 3.  Cholelithiasis. No radiographic evidence of cholecystitis. 4. Colonic diverticulosis, without radiographic evidence of diverticulitis. 5. 4.3 cm uterine fibroid. Electronically Signed   By: Marlaine Hind M.D.   On: 04/16/2020 15:04   CT TIBIA FIBULA LEFT W CONTRAST  Result Date: 04/18/2020 CLINICAL DATA:  Cellulitis of the left lower leg. EXAM: CT OF THE LOWER RIGHT EXTREMITY WITH CONTRAST TECHNIQUE: Multidetector CT imaging of the lower right extremity was performed according to the standard protocol following intravenous contrast administration. COMPARISON:  None. CONTRAST:  13m OMNIPAQUE IOHEXOL 300 MG/ML  SOLN FINDINGS: Bones/Joint/Cartilage The bones of the knee and the tibia and fibula and bones of the ankle joint appear normal. Minimal right knee effusion. No ankle effusion. Muscles and Tendons Normal. Soft tissues There circumferential subcutaneous edema throughout the lower leg, nonspecific. This could represent cellulitis. No definable abscesses. IMPRESSION: 1. Circumferential subcutaneous edema throughout the lower leg, nonspecific. This could represent cellulitis. 2. No definable abscesses. 3. Minimal right knee effusion. Electronically Signed   By: JLorriane ShireM.D.   On: 04/18/2020 12:50   CT FEMUR LEFT W CONTRAST  Result Date: 04/18/2020 CLINICAL DATA:  Cellulitis of the left lower extremity. Left leg swelling. Left leg pain. EXAM: CT OF THE LOWER RIGHT EXTREMITY WITH CONTRAST TECHNIQUE: Multidetector CT imaging of the lower right extremity was performed according to the standard protocol following intravenous contrast administration. COMPARISON:  Radiographs dated 04/17/2020 CONTRAST:  103mOMNIPAQUE IOHEXOL 300 MG/ML  SOLN FINDINGS: Bones/Joint/Cartilage The bones of the left hip appear normal. Left femur is normal. No hip effusion. Minimal left knee effusion. Muscles and Tendons Muscles and tendons of the left hip and left thigh are normal. Soft tissues There is skin thickening and  subcutaneous edema in the lateral aspect of mid and distal left thigh without a definable abscess. IMPRESSION: 1. Skin thickening and subcutaneous edema in the lateral aspect of the mid and distal left thigh without a definable abscess. The finding is consistent with cellulitis. 2. Minimal left knee effusion. Electronically Signed   By: JaLorriane Shire.D.   On: 04/18/2020 12:47   CT FOOT LEFT W CONTRAST  Result Date: 04/18/2020 CLINICAL DATA:  Cellulitis of the left lower extremity. EXAM: CT OF THE LEFT FOOT WITH CONTRAST TECHNIQUE: Multidetector CT imaging of the left foot was performed according to the standard protocol following intravenous contrast administration. COMPARISON:  Radiographs dated 04/17/2020 CONTRAST:  10026mMNIPAQUE IOHEXOL 300 MG/ML  SOLN FINDINGS: Bones/Joint/Cartilage Normal. No joint effusions. No osteomyelitis. Muscles and Tendons Normal. Soft tissues There is subcutaneous edema at the medial and lateral aspects of the foot and extending onto the dorsum of the foot. No abscesses. IMPRESSION: 1. Subcutaneous edema of the foot and ankle consistent with cellulitis. 2. No abscess or osteomyelitis. Electronically Signed   By: JamLorriane ShireD.   On: 04/18/2020 12:53   DG Foot Complete Left  Result Date: 04/17/2020 CLINICAL DATA:  Cellulitis EXAM: LEFT FOOT - COMPLETE 3+ VIEW COMPARISON:  None. FINDINGS: No fracture or malalignment. Diffuse soft tissue edema.  No erosion or bony destruction. IMPRESSION: No acute osseous abnormality Electronically Signed   By: Donavan Foil M.D.   On: 04/17/2020 20:34   DG FEMUR 1V LEFT  Result Date: 04/17/2020 CLINICAL DATA:  Cellulitis EXAM: LEFT FEMUR 1 VIEW COMPARISON:  None. FINDINGS: No fracture or malalignment. Soft tissue edema without soft tissue emphysema. IMPRESSION: No acute osseous abnormality Electronically Signed   By: Donavan Foil M.D.   On: 04/17/2020 20:35   VAS [place] LOWER EXTREMITY VENOUS (DVT)  Result Date: 04/15/2020  Lower  Venous DVTStudy Indications: Swelling, and Pain.  Risk Factors: Cancer. Limitations: Poor ultrasound/tissue interface, body habitus and patient positioning. Comparison Study: 03/25/2020 - Negative for DVT Performing Technologist: Oliver Hum RVT  Examination Guidelines: A complete evaluation includes B-mode imaging, spectral Doppler, color Doppler, and power Doppler as needed of all accessible portions of each vessel. Bilateral testing is considered an integral part of a complete examination. Limited examinations for reoccurring indications may be performed as noted. The reflux portion of the exam is performed with the patient in reverse Trendelenburg.  +-----+---------------+---------+-----------+----------+--------------+ RIGHTCompressibilityPhasicitySpontaneityPropertiesThrombus Aging +-----+---------------+---------+-----------+----------+--------------+ CFV  Full           Yes      Yes                                 +-----+---------------+---------+-----------+----------+--------------+   +---------+---------------+---------+-----------+----------+--------------+ LEFT     CompressibilityPhasicitySpontaneityPropertiesThrombus Aging +---------+---------------+---------+-----------+----------+--------------+ CFV      Full           Yes      Yes                                 +---------+---------------+---------+-----------+----------+--------------+ SFJ      Full                                                        +---------+---------------+---------+-----------+----------+--------------+ FV Prox  Full                                                        +---------+---------------+---------+-----------+----------+--------------+ FV Mid                  Yes      Yes                                 +---------+---------------+---------+-----------+----------+--------------+ FV Distal               Yes      Yes                                  +---------+---------------+---------+-----------+----------+--------------+ PFV      Full                                                        +---------+---------------+---------+-----------+----------+--------------+  POP      Full           Yes      Yes                                 +---------+---------------+---------+-----------+----------+--------------+ PTV      Full                                                        +---------+---------------+---------+-----------+----------+--------------+ PERO     Full                                                        +---------+---------------+---------+-----------+----------+--------------+     Summary: RIGHT: - No evidence of common femoral vein obstruction.  LEFT: - There is no evidence of deep vein thrombosis in the lower extremity. However, portions of this examination were limited- see technologist comments above.  - No cystic structure found in the popliteal fossa.  *See table(s) above for measurements and observations. Electronically signed by Deitra Mayo MD on 04/15/2020 at 6:51:36 PM.    Final    VAS [place] LOWER EXTREMITY VENOUS (DVT)  Result Date: 03/25/2020  Lower Venous DVTStudy Indications: Swelling.  Risk Factors: Chemotherapy. Comparison Study: no prior Performing Technologist: June Leap RDMS, RVT  Examination Guidelines: A complete evaluation includes B-mode imaging, spectral Doppler, color Doppler, and power Doppler as needed of all accessible portions of each vessel. Bilateral testing is considered an integral part of a complete examination. Limited examinations for reoccurring indications may be performed as noted. The reflux portion of the exam is performed with the patient in reverse Trendelenburg.  +-----+---------------+---------+-----------+----------+--------------+ RIGHTCompressibilityPhasicitySpontaneityPropertiesThrombus Aging  +-----+---------------+---------+-----------+----------+--------------+ CFV  Full           Yes      Yes                                 +-----+---------------+---------+-----------+----------+--------------+   +---------+---------------+---------+-----------+----------+--------------+ LEFT     CompressibilityPhasicitySpontaneityPropertiesThrombus Aging +---------+---------------+---------+-----------+----------+--------------+ CFV      Full           Yes      Yes                                 +---------+---------------+---------+-----------+----------+--------------+ SFJ      Full                                                        +---------+---------------+---------+-----------+----------+--------------+ FV Prox  Full                                                        +---------+---------------+---------+-----------+----------+--------------+ FV Mid  Full                                                        +---------+---------------+---------+-----------+----------+--------------+ FV DistalFull                                                        +---------+---------------+---------+-----------+----------+--------------+ PFV      Full                                                        +---------+---------------+---------+-----------+----------+--------------+ POP      Full           Yes      Yes                                 +---------+---------------+---------+-----------+----------+--------------+ PTV      Full                                                        +---------+---------------+---------+-----------+----------+--------------+ PERO     Full                                                        +---------+---------------+---------+-----------+----------+--------------+     Summary: RIGHT: - No evidence of common femoral vein obstruction.  LEFT: - There is no evidence of deep vein thrombosis in the  lower extremity.  - No cystic structure found in the popliteal fossa.  *See table(s) above for measurements and observations. Electronically signed by Deitra Mayo MD on 03/25/2020 at 3:14:06 PM.    Final     ASSESSMENT AND PLAN: 1.  Cellulitis of the left lower extremity 2.  Right breast cancer 3.  Anemia likely due to recent chemotherapy 4.  Anxiety/depression 5.  Hypothyroidism 6.  Hypertension  -The patient is scheduled for her next cycle of chemotherapy in our office on 04/22/2020.  Discussed with the patient that we cannot proceed with chemotherapy at this time due to acute infection.  Recommend delay of her chemotherapy until infection is resolved. -Continue IV antibiotics.  ID consult has been requested by hospitalist and is pending. -Monitor hemoglobin closely.  Transfuse for hemoglobin less than 7.   LOS: 4 days   Mikey Bussing, DNP, AGPCNP-BC, AOCNP 04/21/20 Attending Note  I personally saw and examined [name]. The plan of care was discussed with her. I agree with the assessment and plan as documented above. Please see my progress note.   Signed Harriette Ohara, MD

## 2020-04-21 NOTE — Progress Notes (Signed)
Patient's potasium is 3.1, Dr. Vallery Ridge notified, orders written.

## 2020-04-22 ENCOUNTER — Inpatient Hospital Stay: Payer: Medicare PPO

## 2020-04-22 ENCOUNTER — Inpatient Hospital Stay: Payer: Medicare PPO | Admitting: Hematology and Oncology

## 2020-04-22 ENCOUNTER — Encounter: Payer: Self-pay | Admitting: *Deleted

## 2020-04-22 DIAGNOSIS — L03116 Cellulitis of left lower limb: Secondary | ICD-10-CM | POA: Diagnosis not present

## 2020-04-22 LAB — BASIC METABOLIC PANEL
Anion gap: 3 — ABNORMAL LOW (ref 5–15)
BUN: 15 mg/dL (ref 8–23)
CO2: 25 mmol/L (ref 22–32)
Calcium: 8.2 mg/dL — ABNORMAL LOW (ref 8.9–10.3)
Chloride: 109 mmol/L (ref 98–111)
Creatinine, Ser: 0.53 mg/dL (ref 0.44–1.00)
GFR calc Af Amer: >60 mL/min (ref >60)
GFR calc non Af Amer: >60 mL/min (ref >60)
Glucose, Bld: 127 mg/dL — ABNORMAL HIGH (ref 70–99)
Potassium: 3.5 mmol/L (ref 3.5–5.1)
Sodium: 137 mmol/L (ref 135–145)

## 2020-04-22 LAB — CBC
HCT: 30.9 % — ABNORMAL LOW (ref 36.0–46.0)
Hemoglobin: 9.8 g/dL — ABNORMAL LOW (ref 12.0–15.0)
MCH: 33.2 pg (ref 26.0–34.0)
MCHC: 31.7 g/dL (ref 30.0–36.0)
MCV: 104.7 fL — ABNORMAL HIGH (ref 80.0–100.0)
Platelets: 242 10*3/uL (ref 150–400)
RBC: 2.95 MIL/uL — ABNORMAL LOW (ref 3.87–5.11)
RDW: 17.3 % — ABNORMAL HIGH (ref 11.5–15.5)
WBC: 4.9 10*3/uL (ref 4.0–10.5)
nRBC: 0 % (ref 0.0–0.2)

## 2020-04-22 LAB — CULTURE, BLOOD (ROUTINE X 2)
Culture: NO GROWTH
Culture: NO GROWTH

## 2020-04-22 MED ORDER — GABAPENTIN 100 MG PO CAPS
100.0000 mg | ORAL_CAPSULE | Freq: Three times a day (TID) | ORAL | Status: DC
  Administered 2020-04-22 – 2020-04-24 (×6): 100 mg via ORAL
  Filled 2020-04-22 (×6): qty 1

## 2020-04-22 MED ORDER — OXYCODONE-ACETAMINOPHEN 5-325 MG PO TABS
1.0000 | ORAL_TABLET | ORAL | Status: DC | PRN
  Administered 2020-04-22 – 2020-04-24 (×8): 1 via ORAL
  Filled 2020-04-22 (×8): qty 1

## 2020-04-22 NOTE — Progress Notes (Signed)
Labs sent and verified with lab techs on the floor.

## 2020-04-22 NOTE — Care Management Important Message (Signed)
Important Message  Patient Details IM Letter given to Marney Doctor RN Case Manager to present to the Patient Name: [name] MRN: [id number] Date of Birth: 1951/04/09   Medicare Important Message Given:  Yes     Kerin Salen 04/22/2020, 11:18 AM

## 2020-04-22 NOTE — Progress Notes (Signed)
PROGRESS NOTE    Isabel TRUMBAUER  [id number] DOB: 30-Dec-1950 DOA: 04/17/2020 PCP: Maurice Small, MD   Brief Narrative:  69 y.o.femalewith HTN, anxiety/depression, ER positive malignant neoplasm of the right breast undergoing chemotherapy(trastuzumab and taxol)by Dr. Payton Mccallum who presented on4/29/2021with worsening left leg swelling, redness and tenderness.   She initially noticed redness on left foot after a frozen right gross from her freezer fell onto her foot approximately 6 weeks ago. At that time she noticed redness with a bump that progressed to swelling. She was seen by her primary care physician and given a course of Keflex for about a week with little improvement followed by Bactrim with still no improvement. She underwent venous duplex on 4/6 for evaluation of her swelling by her oncologist which was negative for DVT. The swelling and redness and pain continue to worsen despite elevating legs and wearing compression hose. Repeat venous duplex on 4/27 was also again negative for DVT underwent CT abdomen and pelvis on 4/28 which was negative for any lymph node blockage or enlargement. She was started on IV ceftriaxone and IV vancomycin at the cancer center on 4/27. She received her third dose on 4/28 still no improvement in redness, swelling or pain which prompted direct admission to [facility] long for further management of left lower extremity cellulitis.  On arrival to [facility] long patient wasafebrile, nontoxic-appearing, hemodynamically stableand complaining of exquisite pain to left leg  Chronic diarrhea. Related to Herceptin. Takes Imodium as needed at home. Denies any nausea or vomiting.  She denies any fevers, chills, nausea, vomiting, chest pain, shortness of breath, abdominal pain, other areas of redness, swelling or tenderness. Denies any drainage redness at Port-A-Cath site     Assessment & Plan:   Active Problems:   Hypothyroidism   Malignant neoplasm  of upper-inner quadrant of right breast in female, estrogen receptor positive (HCC)   Left leg cellulitis   Cellulitis of left leg   Hypokalemia   Macrocytic anemia   Anxiety   Depression   Chronic diarrhea   Chemotherapy induced diarrhea   #Nonpurulent cellulitis of left lower extremity in patient undergoing chemotherapy with failed outpatientantibiotictreatment with Keflex and Bactrim  -CT left lower extremity without any evidence of collection. -Duplex lower extremity was a limited study but without any evidence of DVT. -Blood cultures negative to date -Initially on Vanco/ceftriaxone that has been changed to to cefazolin 2 g every 8 per ID.  Continue with leg elevation.   #Hypokalemia, likely from diarrhea related to current chemotherapy (Herceptin), resolved after oral repletion. Magnesium within normal limits, has occasional nausea, no vomiting, reports diarrhea in the setting of her undergoing chemotherapy.  No diarrhea. -C. difficile negative.  #Hypothyroidism. TSH elevated. Already on quite high dose, likely related to poor absorption if synthroid not taken alone -Continue home Synthroid 137 mcg daily  #Anxiety/depression.Stable -Diazepam as needed bedtime -Continue home Elavil and Prozac  #Hypertension. SBP 150s. -Holding home amlodipine in setting of left lower leg swelling related to cellulitis -As needed IV hydralazine -Closely monitor   DVT prophylaxis: Heparin  Code Status: DNR Family Communication: None Disposition Plan:  Status is: Inpatient  Remains inpatient appropriate because:Inpatient level of care appropriate due to severity of illness   Dispo: The patient is from: Home              Anticipated d/c is to: Home              Anticipated d/c date is: 3 days  Patient currently is not medically stable to d/c.          Consultants:   None   Procedures: None  Antimicrobials: Vancomycin and  ceftriaxone  Subjective: Placed on enteric precautions this morning secondary to loose bowel movements.  In process of ruling out C. difficile.  She is upset that the cellulitis is taking time to improve since she wants to move on with her chemotherapy.  Objective: Vitals:   04/22/20 0120 04/22/20 0347 04/22/20 0357 04/22/20 1455  BP: (!) 148/74  (!) 143/85 (!) 167/96  Pulse: 77  81 79  Resp:   18 16  Temp:   98.3 F (36.8 C) 97.6 F (36.4 C)  TempSrc:   Oral Oral  SpO2:   98% 98%  Weight:  118 kg    Height:        Intake/Output Summary (Last 24 hours) at 04/22/2020 1744 Last data filed at 04/22/2020 0920 Gross per 24 hour  Intake 1160 ml  Output --  Net 1160 ml   Filed Weights   04/20/20 0231 04/21/20 0312 04/22/20 0347  Weight: 118.4 kg 119.9 kg 118 kg    Examination:  General exam: Appears calm and comfortable  Respiratory system: Clear to auscultation. Respiratory effort normal. Cardiovascular system: S1 & S2 heard, RRR. No JVD, murmurs, rubs, gallops or clicks. No pedal edema. Right-sided Port-A-Cath in place.  Gastrointestinal system: Abdomen is nondistended, soft and nontender. No organomegaly or masses felt. Normal bowel sounds heard. Central nervous system: Alert and oriented. No focal neurological deficits. Extremities: Symmetric 5 x 5 power.  Significant asymmetric swelling of the left lower extremity with superficial redness and warmth.  No evidence of localization.  Skin: No rashes, lesions or ulcers Psychiatry: Judgement and insight appear normal. Mood & affect appropriate.     Data Reviewed: I have personally reviewed following labs and imaging studies  CBC: Recent Labs  Lab 04/17/20 1841 04/18/20 0612 04/19/20 0605 04/21/20 1122 04/22/20 1045  WBC 4.1 3.9* 3.6* 4.3 4.9  NEUTROABS  --   --   --  3.1  --   HGB 9.3* 10.4* 10.3* 8.9* 9.8*  HCT 29.5* 32.8* 33.0* 28.3* 30.9*  MCV 102.8* 104.1* 105.8* 104.4* 104.7*  PLT 215 240 249 215 [id number]    Basic Metabolic Panel: Recent Labs  Lab 04/17/20 1841 04/17/20 1841 04/18/20 0612 04/19/20 0605 04/20/20 1429 04/21/20 1122 04/22/20 0930  NA 144   < > 140 140 142 140 137  K 2.8*   < > 3.9 4.0 3.0* 3.1* 3.5  CL 112*   < > 107 107 111 110 109  CO2 24   < > 26 26 25 25 25   GLUCOSE 89   < > 120* 118* 116* 93 127*  BUN 20   < > 28* 26* 20 19 15   CREATININE 0.51   < > 0.71 0.63 0.52 0.39* 0.53  CALCIUM 7.0*   < > 8.5* 8.4* 7.6* 7.4* 8.2*  MG 1.7  --   --   --   --   --   --    < > = values in this interval not displayed.   GFR: Estimated Creatinine Clearance: 87 mL/min (by C-G formula based on SCr of 0.53 mg/dL). Liver Function Tests: Recent Labs  Lab 04/17/20 1841  AST 23  ALT 22  ALKPHOS 51  BILITOT 0.5  PROT 5.4*  ALBUMIN 3.0*   No results for input(s): LIPASE, AMYLASE in the last 168 hours. No results for  input(s): AMMONIA in the last 168 hours. Coagulation Profile: No results for input(s): INR, PROTIME in the last 168 hours. Cardiac Enzymes: No results for input(s): CKTOTAL, CKMB, CKMBINDEX, TROPONINI in the last 168 hours. BNP (last 3 results) No results for input(s): PROBNP in the last 8760 hours. HbA1C: No results for input(s): HGBA1C in the last 72 hours. CBG: No results for input(s): GLUCAP in the last 168 hours. Lipid Profile: No results for input(s): CHOL, HDL, LDLCALC, TRIG, CHOLHDL, LDLDIRECT in the last 72 hours. Thyroid Function Tests: No results for input(s): TSH, T4TOTAL, FREET4, T3FREE, THYROIDAB in the last 72 hours. Anemia Panel: No results for input(s): VITAMINB12, FOLATE, FERRITIN, TIBC, IRON, RETICCTPCT in the last 72 hours. Sepsis Labs: No results for input(s): PROCALCITON, LATICACIDVEN in the last 168 hours.  Recent Results (from the past 240 hour(s))  Culture, blood (routine x 2)     Status: None   Collection Time: 04/17/20  6:21 PM   Specimen: BLOOD  Result Value Ref Range Status   Specimen Description   Final    BLOOD LEFT  ANTECUBITAL Performed at [facility] [address] [place], [place] [address]    Special Requests   Final    BOTTLES DRAWN AEROBIC AND ANAEROBIC Blood Culture results may not be optimal due to an excessive volume of blood received in culture bottles Performed at [facility] [address] [place], [place] [address]    Culture   Final    NO GROWTH 5 DAYS Performed at [facility], [place] [address], [place], [place] [address]    Report Status 04/22/2020 FINAL  Final  Culture, blood (routine x 2)     Status: None   Collection Time: 04/17/20  6:21 PM   Specimen: BLOOD  Result Value Ref Range Status   Specimen Description   Final    BLOOD RIGHT ANTECUBITAL Performed at [facility] [address] [place], [place] [address]    Special Requests   Final    BOTTLES DRAWN AEROBIC AND ANAEROBIC Blood Culture results may not be optimal due to an excessive volume of blood received in culture bottles Performed at [facility] [address], [place], [place] [address]    Culture   Final    NO GROWTH 5 DAYS Performed at [facility], [place] [address], [place], [place] [address]    Report Status 04/22/2020 FINAL  Final  SARS CORONAVIRUS 2 (TAT 6-24 HRS) Nasopharyngeal Nasopharyngeal Swab     Status: None   Collection Time: 04/18/20  6:12 AM   Specimen: Nasopharyngeal Swab  Result Value Ref Range Status   SARS Coronavirus 2 NEGATIVE NEGATIVE Final    Comment: (NOTE) SARS-CoV-2 target nucleic acids are NOT DETECTED. The SARS-CoV-2 RNA is generally detectable in upper and lower respiratory specimens during the acute phase of infection. Negative results do not preclude SARS-CoV-2 infection, do not rule out co-infections with other pathogens, and should not be used as the sole basis for treatment or other patient management decisions. Negative results must be combined with clinical observations, patient  history, and epidemiological information. The expected result is Negative. Fact Sheet for Patients: SugarRoll.be Fact Sheet for Healthcare Providers: https://www.woods-mathews.com/ This test is not yet approved or cleared by the [place] FDA and  has been authorized for detection and/or diagnosis of SARS-CoV-2 by FDA under an Emergency Use Authorization (EUA). This EUA will remain  in effect (meaning this test can be  used) for the duration of the COVID-19 declaration under Section 56 4(b)(1) of the Act, 21 U.S.C. section 360bbb-3(b)(1), unless the authorization is terminated or revoked sooner. Performed at [facility], [place] [address], [place], [place] [address]          Radiology Studies: No results found.      Scheduled Meds: . amitriptyline  75 mg Oral QHS  . Chlorhexidine Gluconate Cloth  6 each Topical Daily  . docusate sodium  100 mg Oral BID  . FLUoxetine  80 mg Oral QHS  . gabapentin  100 mg Oral TID  . heparin  5,000 Units Subcutaneous Q8H  . levothyroxine  137 mcg Oral QHS  . loperamide  2 mg Oral Once   Continuous Infusions: .  ceFAZolin (ANCEF) IV 2 g (04/22/20 1325)     LOS: 5 days    Time spent: 35 min    Rossana Molchan, MD [facility]   To contact the attending provider between 7A-7P or the covering provider during after hours 7P-7A, please log into the web site [email or web address] and access using universal [place] password for that web site. If you do not have the password, please call the hospital operator.  04/22/2020, 5:44 PM

## 2020-04-23 DIAGNOSIS — L03116 Cellulitis of left lower limb: Secondary | ICD-10-CM | POA: Diagnosis not present

## 2020-04-23 DIAGNOSIS — K521 Toxic gastroenteritis and colitis: Secondary | ICD-10-CM | POA: Diagnosis not present

## 2020-04-23 DIAGNOSIS — K529 Noninfective gastroenteritis and colitis, unspecified: Secondary | ICD-10-CM

## 2020-04-23 DIAGNOSIS — F419 Anxiety disorder, unspecified: Secondary | ICD-10-CM | POA: Diagnosis not present

## 2020-04-23 LAB — BASIC METABOLIC PANEL
Anion gap: 10 (ref 5–15)
BUN: 14 mg/dL (ref 8–23)
CO2: 27 mmol/L (ref 22–32)
Calcium: 8.5 mg/dL — ABNORMAL LOW (ref 8.9–10.3)
Chloride: 104 mmol/L (ref 98–111)
Creatinine, Ser: 0.69 mg/dL (ref 0.44–1.00)
GFR calc Af Amer: >60 mL/min (ref >60)
GFR calc non Af Amer: >60 mL/min (ref >60)
Glucose, Bld: 116 mg/dL — ABNORMAL HIGH (ref 70–99)
Potassium: 3.7 mmol/L (ref 3.5–5.1)
Sodium: 141 mmol/L (ref 135–145)

## 2020-04-23 LAB — CBC WITH DIFFERENTIAL/PLATELET
Abs Immature Granulocytes: 0.13 10*3/uL — ABNORMAL HIGH (ref 0.00–0.07)
Basophils Absolute: 0 10*3/uL (ref 0.0–0.1)
Basophils Relative: 1 %
Eosinophils Absolute: 0.1 10*3/uL (ref 0.0–0.5)
Eosinophils Relative: 2 %
HCT: 31.1 % — ABNORMAL LOW (ref 36.0–46.0)
Hemoglobin: 9.8 g/dL — ABNORMAL LOW (ref 12.0–15.0)
Immature Granulocytes: 3 %
Lymphocytes Relative: 20 %
Lymphs Abs: 1 10*3/uL (ref 0.7–4.0)
MCH: 32.7 pg (ref 26.0–34.0)
MCHC: 31.5 g/dL (ref 30.0–36.0)
MCV: 103.7 fL — ABNORMAL HIGH (ref 80.0–100.0)
Monocytes Absolute: 0.4 10*3/uL (ref 0.1–1.0)
Monocytes Relative: 8 %
Neutro Abs: 3.3 10*3/uL (ref 1.7–7.7)
Neutrophils Relative %: 66 %
Platelets: 231 10*3/uL (ref 150–400)
RBC: 3 MIL/uL — ABNORMAL LOW (ref 3.87–5.11)
RDW: 17.4 % — ABNORMAL HIGH (ref 11.5–15.5)
WBC: 4.9 10*3/uL (ref 4.0–10.5)
nRBC: 0 % (ref 0.0–0.2)

## 2020-04-23 MED ORDER — FUROSEMIDE 10 MG/ML IJ SOLN
20.0000 mg | Freq: Once | INTRAMUSCULAR | Status: AC
  Administered 2020-04-23: 20 mg via INTRAVENOUS
  Filled 2020-04-23: qty 2

## 2020-04-23 NOTE — Progress Notes (Signed)
Pharmacist Chemotherapy Monitoring - Follow Up Assessment    I verify that I have reviewed each item in the below checklist:  . Regimen for the patient is scheduled for the appropriate day and plan matches scheduled date. Marland Kitchen Appropriate non-routine labs are ordered dependent on drug ordered. . If applicable, additional medications reviewed and ordered per protocol based on lifetime cumulative doses and/or treatment regimen.   Plan for follow-up and/or issues identified: Yes . I-vent associated with next due treatment: Yes . MD and/or nursing notified: No   Kennith Center, Pharm.D., CPP 04/23/2020@2 :55 PM

## 2020-04-23 NOTE — Progress Notes (Signed)
[facility]                                                                              Patient Demographics  [name], is a 69 y.o. female, DOB - 1950/12/25, [id number]  Admit date - 04/17/2020   Admitting Physician Desiree Hane, MD  Outpatient Primary MD for the patient is Maurice Small, MD  Outpatient specialists:   LOS - 6  days   Medical records reviewed and are as summarized below:    No chief complaint on file.      Brief summary   69 y.o.femalewith HTN, anxiety/depression, ER positive malignant neoplasm of the right breast undergoing chemotherapy(trastuzumab and taxol)by Dr. Payton Mccallum who presented on4/29/2021with worsening left leg swelling, redness and tenderness.   She initially noticed redness on left foot after a frozen right gross from her freezer fell onto her foot approximately 6 weeks ago. At that time she noticed redness with a bump that progressed to swelling. She was seen by her primary care physician and given a course of Keflex for about a week with little improvement followed by Bactrim with still no improvement. She underwent venous duplex on 4/6 for evaluation of her swelling by her oncologist which was negative for DVT. The swelling and redness and pain continue to worsen despite elevating legs and wearing compression hose. Repeat venous duplex on 4/27 was also again negative for DVT underwent CT abdomen and pelvis on 4/28 which was negative for any lymph node blockage or enlargement. She was started on IV ceftriaxone and IV vancomycin at the cancer center on 4/27. She received her third dose on 4/28 still no improvement in redness, swelling or pain which prompted direct admission to [facility] long for further management of left lower extremity cellulitis.   Assessment & Plan   Nonpurulent cellulitis, L LE, outpatient failure to treatment -CT left lower extremity without any evidence of abscess or fluid collection.   Venous Dopplers negative for DVT -Blood cultures negative till date -Initially placed on IV vancomycin and ceftriaxone.  ID was consulted, per Dr. Graylon Good antibiotics narrowed to cefazolin for additional 3 days or when ready to discharge, changed to cephalexin 500 mg 4 times daily - will transition to cephalexin tomorrow at the time of discharge, -Recommended to continue lower extremity elevation, will give 1 dose of Lasix 20 mg IV for the swelling  Hypokalemia -Resolved likely due to diarrhea from chemotherapy/Herceptin  Hypothyroidism -Continue Synthroid 137 MCG daily -TSH elevated at 30.4, will defer to PCP to increase dose of Synthroid and follow thyroid studies  Anxiety/depression -Continue diazepam as needed -Continue Elavil and Prozac  Essential hypertension -BP somewhat elevated, continue hydralazine as needed with parameters  obesity Estimated body mass index is 40.74 kg/m as calculated from the following:   Height as of this encounter: 5\' 7"  (1.702 m).   Weight as of this encounter: 118 kg.  Code Status:  DNR DVT Prophylaxis: Heparin subcu Family Communication: Discussed all imaging results, lab results, explained to the patient   Disposition Plan:     Status is: Inpatient  Remains inpatient appropriate because:IV treatments appropriate due to intensity of  illness or inability to take PO, on IV cefazolin for another 24 hours, anticipated discharge home tomorrow if no acute issues overnight   Dispo: The patient is from: Home              Anticipated d/c is to: Home              Anticipated d/c date is: 1 day              Patient currently is not medically stable to d/c.       Time Spent in minutes   35 minutes  Procedures:  None  Consultants:   Infectious disease Oncology  Antimicrobials:   Anti-infectives (From admission, onward)   Start     Dose/Rate Route Frequency Ordered Stop   04/21/20 2200  ceFAZolin (ANCEF) IVPB 2g/100 mL premix     2  g 200 mL/hr over 30 Minutes Intravenous Every 8 hours 04/21/20 1543 04/24/20 2159   04/18/20 1200  cefTRIAXone (ROCEPHIN) 2 g in sodium chloride 0.9 % 100 mL IVPB  Status:  Discontinued     2 g 200 mL/hr over 30 Minutes Intravenous Every 24 hours 04/17/20 1827 04/21/20 1533   04/17/20 2000  vancomycin (VANCOREADY) IVPB 1500 mg/300 mL  Status:  Discontinued     1,500 mg 150 mL/hr over 120 Minutes Intravenous Every 24 hours 04/17/20 1847 04/21/20 1533   04/17/20 1930  cefTRIAXone (ROCEPHIN) 1 g in sodium chloride 0.9 % 100 mL IVPB     1 g 200 mL/hr over 30 Minutes Intravenous  Once 04/17/20 1827 04/17/20 2118          Medications  Scheduled Meds:  amitriptyline  75 mg Oral QHS   Chlorhexidine Gluconate Cloth  6 each Topical Daily   docusate sodium  100 mg Oral BID   FLUoxetine  80 mg Oral QHS   gabapentin  100 mg Oral TID   heparin  5,000 Units Subcutaneous Q8H   levothyroxine  137 mcg Oral QHS   loperamide  2 mg Oral Once   Continuous Infusions:   ceFAZolin (ANCEF) IV 2 g (04/23/20 0608)   PRN Meds:.acetaminophen **OR** acetaminophen, diazepam, hydrALAZINE, ondansetron **OR** ondansetron (ZOFRAN) IV, oxyCODONE-acetaminophen, polyethylene glycol      Subjective:   [name] was seen and examined today.  Per patient, left lower extremity redness improving, still somewhat warm and swollen.  No fevers or chills.  Patient denies dizziness, chest pain, shortness of breath, abdominal pain, N/V/D/C, new weakness, numbess, tingling. No acute events overnight.    Objective:   Vitals:   04/22/20 0357 04/22/20 1455 04/22/20 2038 04/23/20 0548  BP: (!) 143/85 (!) 167/96 (!) 153/74 (!) 157/89  Pulse: 81 79 78 80  Resp: 18 16 20 19   Temp: 98.3 F (36.8 C) 97.6 F (36.4 C) 98.3 F (36.8 C) 97.7 F (36.5 C)  TempSrc: Oral Oral Oral Oral  SpO2: 98% 98% 95% 97%  Weight:      Height:        Intake/Output Summary (Last 24 hours) at 04/23/2020 1349 Last data filed  at 04/23/2020 1344 Gross per 24 hour  Intake 720 ml  Output 900 ml  Net -180 ml     Wt Readings from Last 3 Encounters:  04/22/20 118 kg  04/16/20 114.6 kg  04/15/20 99.3 kg     Exam  General: Alert and oriented x 3, NAD  Cardiovascular: S1 S2 auscultated, no murmurs, RRR  Respiratory: Clear to auscultation bilaterally, no wheezing,  rales or rhonchi  Gastrointestinal: Soft, nontender, nondistended, + bowel sounds  Ext: 1+ LLE, no significant edema RLE  Neuro: No new deficits  Musculoskeletal: No digital cyanosis, clubbing  Skin: Left lower extremity warm, erythematous, 1+ edema  Psych: Normal affect and demeanor, alert and oriented x3    Data Reviewed:  I have personally reviewed following labs and imaging studies  Micro Results Recent Results (from the past 240 hour(s))  Culture, blood (routine x 2)     Status: None   Collection Time: 04/17/20  6:21 PM   Specimen: BLOOD  Result Value Ref Range Status   Specimen Description   Final    BLOOD LEFT ANTECUBITAL Performed at [facility] [address] [place], [place] [address]    Special Requests   Final    BOTTLES DRAWN AEROBIC AND ANAEROBIC Blood Culture results may not be optimal due to an excessive volume of blood received in culture bottles Performed at [facility] [address], [place], [place] [address]    Culture   Final    NO GROWTH 5 DAYS Performed at [facility], [place] [address] [place], [place] [address]    Report Status 04/22/2020 FINAL  Final  Culture, blood (routine x 2)     Status: None   Collection Time: 04/17/20  6:21 PM   Specimen: BLOOD  Result Value Ref Range Status   Specimen Description   Final    BLOOD RIGHT ANTECUBITAL Performed at [facility] [address], [place], [place] [address]    Special Requests   Final    BOTTLES DRAWN AEROBIC AND ANAEROBIC Blood Culture results may not be optimal due to an excessive  volume of blood received in culture bottles Performed at [facility] [address] [place], [place] [address]    Culture   Final    NO GROWTH 5 DAYS Performed at [facility], [place] [address], [place], [place] [address]    Report Status 04/22/2020 FINAL  Final  SARS CORONAVIRUS 2 (TAT 6-24 HRS) Nasopharyngeal Nasopharyngeal Swab     Status: None   Collection Time: 04/18/20  6:12 AM   Specimen: Nasopharyngeal Swab  Result Value Ref Range Status   SARS Coronavirus 2 NEGATIVE NEGATIVE Final    Comment: (NOTE) SARS-CoV-2 target nucleic acids are NOT DETECTED. The SARS-CoV-2 RNA is generally detectable in upper and lower respiratory specimens during the acute phase of infection. Negative results do not preclude SARS-CoV-2 infection, do not rule out co-infections with other pathogens, and should not be used as the sole basis for treatment or other patient management decisions. Negative results must be combined with clinical observations, patient history, and epidemiological information. The expected result is Negative. Fact Sheet for Patients: SugarRoll.be Fact Sheet for Healthcare Providers: https://www.woods-mathews.com/ This test is not yet approved or cleared by the [place] FDA and  has been authorized for detection and/or diagnosis of SARS-CoV-2 by FDA under an Emergency Use Authorization (EUA). This EUA will remain  in effect (meaning this test can be used) for the duration of the COVID-19 declaration under Section 56 4(b)(1) of the Act, 21 U.S.C. section 360bbb-3(b)(1), unless the authorization is terminated or revoked sooner. Performed at [facility], [place] [address], [place], [place] [address]     Radiology Reports DG Tibia/Fibula Left  Result Date: 04/17/2020 CLINICAL DATA:  Cellulitis EXAM: LEFT TIBIA AND FIBULA - 2 VIEW COMPARISON:  None. FINDINGS: No  fracture or malalignment. No  periostitis or bone destruction. Diffuse soft tissue edema without emphysema. IMPRESSION: Soft tissue edema.  No acute osseous abnormality Electronically Signed   By: Donavan Foil M.D.   On: 04/17/2020 20:33   CT Abdomen Pelvis W Contrast  Result Date: 04/16/2020 CLINICAL DATA:  Breast carcinoma. Currently undergoing chemotherapy. Unilateral left leg swelling for 8 weeks. Diarrhea. EXAM: CT ABDOMEN AND PELVIS WITH CONTRAST TECHNIQUE: Multidetector CT imaging of the abdomen and pelvis was performed using the standard protocol following bolus administration of intravenous contrast. CONTRAST:  18mL OMNIPAQUE IOHEXOL 300 MG/ML  SOLN COMPARISON:  None. FINDINGS: Lower Chest: No acute findings. Hepatobiliary: Mild diffuse hepatic steatosis. Subcentimeter low-attenuation lesion in the anterior right lobe has nonspecific characteristics and is difficult to characterize due to its small size. No other liver lesions identified. At least 1 gallstone is seen measuring 2 cm, however there is no evidence of cholecystitis or biliary ductal dilatation. Pancreas:  No mass or inflammatory changes. Spleen: Within normal limits in size and appearance. Adrenals/Urinary Tract: 2 cm homogeneous left adrenal mass is seen which has nonspecific characteristics. No renal masses identified. No evidence of ureteral calculi or hydronephrosis. Unremarkable unopacified urinary bladder. Stomach/Bowel: No evidence of obstruction, inflammatory process or abnormal fluid collections. Normal appendix visualized. Diverticulosis is seen mainly involving the sigmoid colon, however there is no evidence of diverticulitis. Vascular/Lymphatic: No pathologically enlarged lymph nodes. No abdominal aortic aneurysm. No evidence of IVC iliac vein thrombosis. Reproductive: Fibroid seen in the right uterine fundus measuring 4.3 cm. Adnexal regions are unremarkable. Other:  None. Musculoskeletal: No suspicious bone lesions identified. Lumbar spine fusion  hardware noted at L4-5. IMPRESSION: 1. 2 cm indeterminate left adrenal mass, and sub-cm indeterminate liver lesion. Abdomen MRI without and with contrast is recommended for further characterization. 2. Mild hepatic steatosis. 3. Cholelithiasis. No radiographic evidence of cholecystitis. 4. Colonic diverticulosis, without radiographic evidence of diverticulitis. 5. 4.3 cm uterine fibroid. Electronically Signed   By: Marlaine Hind M.D.   On: 04/16/2020 15:04   CT TIBIA FIBULA LEFT W CONTRAST  Result Date: 04/18/2020 CLINICAL DATA:  Cellulitis of the left lower leg. EXAM: CT OF THE LOWER RIGHT EXTREMITY WITH CONTRAST TECHNIQUE: Multidetector CT imaging of the lower right extremity was performed according to the standard protocol following intravenous contrast administration. COMPARISON:  None. CONTRAST:  134mL OMNIPAQUE IOHEXOL 300 MG/ML  SOLN FINDINGS: Bones/Joint/Cartilage The bones of the knee and the tibia and fibula and bones of the ankle joint appear normal. Minimal right knee effusion. No ankle effusion. Muscles and Tendons Normal. Soft tissues There circumferential subcutaneous edema throughout the lower leg, nonspecific. This could represent cellulitis. No definable abscesses. IMPRESSION: 1. Circumferential subcutaneous edema throughout the lower leg, nonspecific. This could represent cellulitis. 2. No definable abscesses. 3. Minimal right knee effusion. Electronically Signed   By: Lorriane Shire M.D.   On: 04/18/2020 12:50   CT FEMUR LEFT W CONTRAST  Result Date: 04/18/2020 CLINICAL DATA:  Cellulitis of the left lower extremity. Left leg swelling. Left leg pain. EXAM: CT OF THE LOWER RIGHT EXTREMITY WITH CONTRAST TECHNIQUE: Multidetector CT imaging of the lower right extremity was performed according to the standard protocol following intravenous contrast administration. COMPARISON:  Radiographs dated 04/17/2020 CONTRAST:  149mL OMNIPAQUE IOHEXOL 300 MG/ML  SOLN FINDINGS: Bones/Joint/Cartilage The  bones of the left hip appear normal. Left femur is normal. No hip effusion. Minimal left knee effusion. Muscles and Tendons Muscles and tendons of the left hip and left thigh are normal.  Soft tissues There is skin thickening and subcutaneous edema in the lateral aspect of mid and distal left thigh without a definable abscess. IMPRESSION: 1. Skin thickening and subcutaneous edema in the lateral aspect of the mid and distal left thigh without a definable abscess. The finding is consistent with cellulitis. 2. Minimal left knee effusion. Electronically Signed   By: Lorriane Shire M.D.   On: 04/18/2020 12:47   CT FOOT LEFT W CONTRAST  Result Date: 04/18/2020 CLINICAL DATA:  Cellulitis of the left lower extremity. EXAM: CT OF THE LEFT FOOT WITH CONTRAST TECHNIQUE: Multidetector CT imaging of the left foot was performed according to the standard protocol following intravenous contrast administration. COMPARISON:  Radiographs dated 04/17/2020 CONTRAST:  169mL OMNIPAQUE IOHEXOL 300 MG/ML  SOLN FINDINGS: Bones/Joint/Cartilage Normal. No joint effusions. No osteomyelitis. Muscles and Tendons Normal. Soft tissues There is subcutaneous edema at the medial and lateral aspects of the foot and extending onto the dorsum of the foot. No abscesses. IMPRESSION: 1. Subcutaneous edema of the foot and ankle consistent with cellulitis. 2. No abscess or osteomyelitis. Electronically Signed   By: Lorriane Shire M.D.   On: 04/18/2020 12:53   DG Foot Complete Left  Result Date: 04/17/2020 CLINICAL DATA:  Cellulitis EXAM: LEFT FOOT - COMPLETE 3+ VIEW COMPARISON:  None. FINDINGS: No fracture or malalignment. Diffuse soft tissue edema. No erosion or bony destruction. IMPRESSION: No acute osseous abnormality Electronically Signed   By: Donavan Foil M.D.   On: 04/17/2020 20:34   DG FEMUR 1V LEFT  Result Date: 04/17/2020 CLINICAL DATA:  Cellulitis EXAM: LEFT FEMUR 1 VIEW COMPARISON:  None. FINDINGS: No fracture or malalignment. Soft  tissue edema without soft tissue emphysema. IMPRESSION: No acute osseous abnormality Electronically Signed   By: Donavan Foil M.D.   On: 04/17/2020 20:35   VAS [place] LOWER EXTREMITY VENOUS (DVT)  Result Date: 04/15/2020  Lower Venous DVTStudy Indications: Swelling, and Pain.  Risk Factors: Cancer. Limitations: Poor ultrasound/tissue interface, body habitus and patient positioning. Comparison Study: 03/25/2020 - Negative for DVT Performing Technologist: Oliver Hum RVT  Examination Guidelines: A complete evaluation includes B-mode imaging, spectral Doppler, color Doppler, and power Doppler as needed of all accessible portions of each vessel. Bilateral testing is considered an integral part of a complete examination. Limited examinations for reoccurring indications may be performed as noted. The reflux portion of the exam is performed with the patient in reverse Trendelenburg.  +-----+---------------+---------+-----------+----------+--------------+  RIGHT Compressibility Phasicity Spontaneity Properties Thrombus Aging  +-----+---------------+---------+-----------+----------+--------------+  CFV   Full            Yes       Yes                                    +-----+---------------+---------+-----------+----------+--------------+   +---------+---------------+---------+-----------+----------+--------------+  LEFT      Compressibility Phasicity Spontaneity Properties Thrombus Aging  +---------+---------------+---------+-----------+----------+--------------+  CFV       Full            Yes       Yes                                    +---------+---------------+---------+-----------+----------+--------------+  SFJ       Full                                                             +---------+---------------+---------+-----------+----------+--------------+  FV Prox   Full                                                             +---------+---------------+---------+-----------+----------+--------------+  FV Mid                     Yes       Yes                                    +---------+---------------+---------+-----------+----------+--------------+  FV Distal                 Yes       Yes                                    +---------+---------------+---------+-----------+----------+--------------+  PFV       Full                                                             +---------+---------------+---------+-----------+----------+--------------+  POP       Full            Yes       Yes                                    +---------+---------------+---------+-----------+----------+--------------+  PTV       Full                                                             +---------+---------------+---------+-----------+----------+--------------+  PERO      Full                                                             +---------+---------------+---------+-----------+----------+--------------+     Summary: RIGHT: - No evidence of common femoral vein obstruction.  LEFT: - There is no evidence of deep vein thrombosis in the lower extremity. However, portions of this examination were limited- see technologist comments above.  - No cystic structure found in the popliteal fossa.  *See table(s) above for measurements and observations. Electronically signed by Deitra Mayo MD on 04/15/2020 at 6:51:36 PM.    Final    VAS [place] LOWER EXTREMITY VENOUS (DVT)  Result Date: 03/25/2020  Lower Venous DVTStudy Indications: Swelling.  Risk Factors: Chemotherapy. Comparison Study: no prior Performing Technologist: June Leap RDMS, RVT  Examination Guidelines: A complete evaluation includes B-mode imaging, spectral Doppler, color Doppler, and power Doppler as needed of all accessible portions of each vessel.  Bilateral testing is considered an integral part of a complete examination. Limited examinations for reoccurring indications may be performed as noted. The reflux portion of the exam is performed with the patient in reverse  Trendelenburg.  +-----+---------------+---------+-----------+----------+--------------+  RIGHT Compressibility Phasicity Spontaneity Properties Thrombus Aging  +-----+---------------+---------+-----------+----------+--------------+  CFV   Full            Yes       Yes                                    +-----+---------------+---------+-----------+----------+--------------+   +---------+---------------+---------+-----------+----------+--------------+  LEFT      Compressibility Phasicity Spontaneity Properties Thrombus Aging  +---------+---------------+---------+-----------+----------+--------------+  CFV       Full            Yes       Yes                                    +---------+---------------+---------+-----------+----------+--------------+  SFJ       Full                                                             +---------+---------------+---------+-----------+----------+--------------+  FV Prox   Full                                                             +---------+---------------+---------+-----------+----------+--------------+  FV Mid    Full                                                             +---------+---------------+---------+-----------+----------+--------------+  FV Distal Full                                                             +---------+---------------+---------+-----------+----------+--------------+  PFV       Full                                                             +---------+---------------+---------+-----------+----------+--------------+  POP       Full            Yes       Yes                                    +---------+---------------+---------+-----------+----------+--------------+  PTV  Full                                                             +---------+---------------+---------+-----------+----------+--------------+  PERO      Full                                                              +---------+---------------+---------+-----------+----------+--------------+     Summary: RIGHT: - No evidence of common femoral vein obstruction.  LEFT: - There is no evidence of deep vein thrombosis in the lower extremity.  - No cystic structure found in the popliteal fossa.  *See table(s) above for measurements and observations. Electronically signed by Deitra Mayo MD on 03/25/2020 at 3:14:06 PM.    Final     Lab Data:  CBC: Recent Labs  Lab 04/18/20 0612 04/19/20 0605 04/21/20 1122 04/22/20 1045 04/23/20 0607  WBC 3.9* 3.6* 4.3 4.9 4.9  NEUTROABS  --   --  3.1  --  3.3  HGB 10.4* 10.3* 8.9* 9.8* 9.8*  HCT 32.8* 33.0* 28.3* 30.9* 31.1*  MCV 104.1* 105.8* 104.4* 104.7* 103.7*  PLT 240 249 215 242 [id number]   Basic Metabolic Panel: Recent Labs  Lab 04/17/20 1841 04/18/20 0612 04/19/20 0605 04/20/20 1429 04/21/20 1122 04/22/20 0930 04/23/20 0607  NA 144   < > 140 142 140 137 141  K 2.8*   < > 4.0 3.0* 3.1* 3.5 3.7  CL 112*   < > 107 111 110 109 104  CO2 24   < > 26 25 25 25 27   GLUCOSE 89   < > 118* 116* 93 127* 116*  BUN 20   < > 26* 20 19 15 14   CREATININE 0.51   < > 0.63 0.52 0.39* 0.53 0.69  CALCIUM 7.0*   < > 8.4* 7.6* 7.4* 8.2* 8.5*  MG 1.7  --   --   --   --   --   --    < > = values in this interval not displayed.   GFR: Estimated Creatinine Clearance: 87 mL/min (by C-G formula based on SCr of 0.69 mg/dL). Liver Function Tests: Recent Labs  Lab 04/17/20 1841  AST 23  ALT 22  ALKPHOS 51  BILITOT 0.5  PROT 5.4*  ALBUMIN 3.0*   No results for input(s): LIPASE, AMYLASE in the last 168 hours. No results for input(s): AMMONIA in the last 168 hours. Coagulation Profile: No results for input(s): INR, PROTIME in the last 168 hours. Cardiac Enzymes: No results for input(s): CKTOTAL, CKMB, CKMBINDEX, TROPONINI in the last 168 hours. BNP (last 3 results) No results for input(s): PROBNP in the last 8760 hours. HbA1C: No results for input(s): HGBA1C in the last  72 hours. CBG: No results for input(s): GLUCAP in the last 168 hours. Lipid Profile: No results for input(s): CHOL, HDL, LDLCALC, TRIG, CHOLHDL, LDLDIRECT in the last 72 hours. Thyroid Function Tests: No results for input(s): TSH, T4TOTAL, FREET4, T3FREE, THYROIDAB in the last 72 hours. Anemia Panel: No results for input(s): VITAMINB12, FOLATE, FERRITIN, TIBC, IRON, RETICCTPCT in the last 72 hours. Urine analysis:  Component Value Date/Time   COLORURINE YELLOW 02/01/2018 2205   APPEARANCEUR HAZY (A) 02/01/2018 2205   LABSPEC 1.016 02/01/2018 2205   PHURINE 5.0 02/01/2018 2205   GLUCOSEU NEGATIVE 02/01/2018 2205   HGBUR NEGATIVE 02/01/2018 [address] 02/01/2018 2205   BILIRUBINUR neg 11/13/2013 1629   KETONESUR 5 (A) 02/01/2018 2205   PROTEINUR NEGATIVE 02/01/2018 2205   UROBILINOGEN negative 11/13/2013 1629   NITRITE NEGATIVE 02/01/2018 2205   LEUKOCYTESUR NEGATIVE 02/01/2018 2205     Kaveon Blatz M.D. [facility] 04/23/2020, 1:49 PM   Call night coverage person covering after 7pm

## 2020-04-24 DIAGNOSIS — F419 Anxiety disorder, unspecified: Secondary | ICD-10-CM | POA: Diagnosis not present

## 2020-04-24 DIAGNOSIS — L03116 Cellulitis of left lower limb: Secondary | ICD-10-CM | POA: Diagnosis not present

## 2020-04-24 DIAGNOSIS — K529 Noninfective gastroenteritis and colitis, unspecified: Secondary | ICD-10-CM | POA: Diagnosis not present

## 2020-04-24 DIAGNOSIS — K521 Toxic gastroenteritis and colitis: Secondary | ICD-10-CM | POA: Diagnosis not present

## 2020-04-24 LAB — CBC WITH DIFFERENTIAL/PLATELET
Abs Immature Granulocytes: 0.15 10*3/uL — ABNORMAL HIGH (ref 0.00–0.07)
Basophils Absolute: 0 10*3/uL (ref 0.0–0.1)
Basophils Relative: 0 %
Eosinophils Absolute: 0.1 10*3/uL (ref 0.0–0.5)
Eosinophils Relative: 2 %
HCT: 30.8 % — ABNORMAL LOW (ref 36.0–46.0)
Hemoglobin: 9.6 g/dL — ABNORMAL LOW (ref 12.0–15.0)
Immature Granulocytes: 3 %
Lymphocytes Relative: 19 %
Lymphs Abs: 1 10*3/uL (ref 0.7–4.0)
MCH: 32.5 pg (ref 26.0–34.0)
MCHC: 31.2 g/dL (ref 30.0–36.0)
MCV: 104.4 fL — ABNORMAL HIGH (ref 80.0–100.0)
Monocytes Absolute: 0.6 10*3/uL (ref 0.1–1.0)
Monocytes Relative: 12 %
Neutro Abs: 3.1 10*3/uL (ref 1.7–7.7)
Neutrophils Relative %: 64 %
Platelets: 272 10*3/uL (ref 150–400)
RBC: 2.95 MIL/uL — ABNORMAL LOW (ref 3.87–5.11)
RDW: 17.6 % — ABNORMAL HIGH (ref 11.5–15.5)
WBC: 4.9 10*3/uL (ref 4.0–10.5)
nRBC: 0 % (ref 0.0–0.2)

## 2020-04-24 LAB — BASIC METABOLIC PANEL
Anion gap: 9 (ref 5–15)
BUN: 16 mg/dL (ref 8–23)
CO2: 28 mmol/L (ref 22–32)
Calcium: 8.5 mg/dL — ABNORMAL LOW (ref 8.9–10.3)
Chloride: 103 mmol/L (ref 98–111)
Creatinine, Ser: 0.72 mg/dL (ref 0.44–1.00)
GFR calc Af Amer: >60 mL/min (ref >60)
GFR calc non Af Amer: >60 mL/min (ref >60)
Glucose, Bld: 133 mg/dL — ABNORMAL HIGH (ref 70–99)
Potassium: 3.7 mmol/L (ref 3.5–5.1)
Sodium: 140 mmol/L (ref 135–145)

## 2020-04-24 MED ORDER — FUROSEMIDE 10 MG/ML IJ SOLN
20.0000 mg | Freq: Once | INTRAMUSCULAR | Status: AC
  Administered 2020-04-24: 20 mg via INTRAVENOUS
  Filled 2020-04-24: qty 2

## 2020-04-24 MED ORDER — FUROSEMIDE 20 MG PO TABS
20.0000 mg | ORAL_TABLET | Freq: Every day | ORAL | 0 refills | Status: DC | PRN
Start: 2020-04-24 — End: 2020-07-22

## 2020-04-24 MED ORDER — HEPARIN SOD (PORK) LOCK FLUSH 100 UNIT/ML IV SOLN: 500.0000 [IU] | INTRAVENOUS | Status: DC | PRN

## 2020-04-24 MED ORDER — GABAPENTIN 100 MG PO CAPS: 100.0000 mg | ORAL_CAPSULE | Freq: Three times a day (TID) | ORAL | 1 refills | Status: DC

## 2020-04-24 MED ORDER — HEPARIN SOD (PORK) LOCK FLUSH 100 UNIT/ML IV SOLN: 500.0000 [IU] | INTRAVENOUS | Status: DC

## 2020-04-24 MED ORDER — OXYCODONE-ACETAMINOPHEN 5-325 MG PO TABS: 1.0000 | ORAL_TABLET | ORAL | 0 refills | Status: DC | PRN

## 2020-04-24 MED ORDER — CEPHALEXIN 500 MG PO CAPS: 500.0000 mg | ORAL_CAPSULE | Freq: Four times a day (QID) | ORAL | 0 refills | Status: AC

## 2020-04-24 NOTE — Progress Notes (Signed)
Brief oncology note:  Note plan for possible discharge later today.  She already has outpatient follow-up scheduled at the cancer center on 04/29/2020 at 1145 am.  She should keep this appointment to recheck her labs, see Dr. Lindi Adie for evaluation prior to resuming chemotherapy, and she is also scheduled for a potential infusion appointment.  Mikey Bussing, DNP, AGPCNP-BC, AOCNP Mon/Tues/Thurs/Fri 7am-5pm; Off Wednesdays Cell: [phone]

## 2020-04-24 NOTE — Discharge Summary (Signed)
Physician Discharge Summary   Patient ID: [name] MRN: [id number] DOB/AGE: 25-Apr-1951 69 y.o.  Admit date: 04/17/2020 Discharge date: 04/24/2020  Primary Care Physician:  Maurice Small, MD   Recommendations for Outpatient Follow-up:  1. Follow up with PCP in 1-2 weeks 2. Continue cephalexin 500 mg 4 times daily for 5 days 3. Please follow TSH and adjust dose.  TSH was 30.4 however patient is already on high-dose, 137 MCG daily    Home Health: Currently at baseline Equipment/Devices:    Discharge Condition: stable  CODE STATUS: FULL  Diet recommendation: Heart healthy diet   Discharge Diagnoses:    . Left leg cellulitis with outpatient failure to treatment . Cellulitis of left leg . Hypokalemia . Hypothyroidism . Macrocytic anemia   Consults: Infectious disease    Allergies:   Allergies  Allergen Reactions  . Ambien [Zolpidem Tartrate] Other (See Comments)    Hallucinations and Confusion     DISCHARGE MEDICATIONS: Allergies as of 04/24/2020      Reactions   Ambien [zolpidem Tartrate] Other (See Comments)   Hallucinations and Confusion      Medication List    TAKE these medications   amitriptyline 25 MG tablet Commonly known as: ELAVIL Take 75 mg by mouth at bedtime.   amLODipine 5 MG tablet Commonly known as: NORVASC Take 7.5 mg by mouth at bedtime.   cephALEXin 500 MG capsule Commonly known as: KEFLEX Take 1 capsule (500 mg total) by mouth 4 (four) times daily for 5 days.   diazepam 10 MG tablet Commonly known as: VALIUM Take 1 tablet (10 mg total) by mouth at bedtime as needed for anxiety. What changed: reasons to take this   FLUoxetine 40 MG capsule Commonly known as: PROZAC Take 80 mg by mouth at bedtime.   furosemide 20 MG tablet Commonly known as: LASIX Take 1 tablet (20 mg total) by mouth daily as needed for fluid or edema. Please take daily for 3 days, then as needed for leg swelling What changed:   when to take  this  reasons to take this  additional instructions   gabapentin 100 MG capsule Commonly known as: NEURONTIN Take 1 capsule (100 mg total) by mouth 3 (three) times daily.   ibuprofen 200 MG tablet Commonly known as: ADVIL Take 400-600 mg by mouth 2 (two) times daily as needed for moderate pain.   levothyroxine 137 MCG tablet Commonly known as: SYNTHROID Take 137 mcg by mouth at bedtime.   lidocaine-prilocaine cream Commonly known as: EMLA Apply to affected area once What changed:   how much to take  how to take this  when to take this  reasons to take this   ondansetron 8 MG tablet Commonly known as: Zofran Take 1 tablet (8 mg total) by mouth 2 (two) times daily as needed (Nausea or vomiting).   oxyCODONE-acetaminophen 5-325 MG tablet Commonly known as: PERCOCET/ROXICET Take 1 tablet by mouth every 4 (four) hours as needed for moderate pain.   prochlorperazine 10 MG tablet Commonly known as: COMPAZINE Take 1 tablet (10 mg total) by mouth every 6 (six) hours as needed (Nausea or vomiting).        Brief H and P: For complete details please refer to admission H and P, but in brief 69 y.o.femalewith HTN, anxiety/depression, ER positive malignant neoplasm of the right breast undergoing chemotherapy(trastuzumab and taxol)by Dr. Payton Mccallum who presented on4/29/2021with worsening left leg swelling, redness and tenderness.   She initially noticed redness on left foot after  a frozen right gross from her freezer fell onto her foot approximately 6 weeks ago. At that time she noticed redness with a bump that progressed to swelling. She was seen by her primary care physician and given a course of Keflex for about a week with little improvement followed by Bactrim with still no improvement. She underwent venous duplex on 4/6 for evaluation of her swelling by her oncologist which was negative for DVT. The swelling and redness and pain continue to worsen despite elevating  legs and wearing compression hose. Repeat venous duplex on 4/27 was also again negative for DVT underwent CT abdomen and pelvis on 4/28 which was negative for any lymph node blockage or enlargement. She was started on IV ceftriaxone and IV vancomycin at the cancer center on 4/27. She received her third dose on 4/28 still no improvement in redness, swelling or pain which prompted direct admission to [facility] for further management of left lower extremity cellulitis.  Hospital Course:   Nonpurulent cellulitis, L LE, outpatient failure to treatment -CT left lower extremity without any evidence of abscess or fluid collection.  Venous Dopplers negative for DVT -Blood cultures negative till date -Initially placed on IV vancomycin and ceftriaxone.  ID was consulted, per Dr. Graylon Good antibiotics narrowed to cefazolin for additional 3 days or when ready to discharge, changed to cephalexin 500 mg 4 times daily - Transition to oral antibiotics, patient recommended to continue leg elevation, TED hoses.  She did have improvement in the swelling with the Lasix, recommended to continue for 3 days.  Hypokalemia -Resolved likely due to diarrhea from chemotherapy/Herceptin  Hypothyroidism -Continue Synthroid 137 MCG daily -TSH elevated at 30.4, will defer to PCP to increase dose of Synthroid and follow thyroid studies  Anxiety/depression -Continue diazepam as needed -Continue Elavil and Prozac  Essential hypertension -Currently stable  obesity Estimated body mass index is 40.74 kg/m as calculated from the following:   Height as of this encounter: 5\' 7"  (1.702 m).   Weight as of this encounter: 118 kg.   Day of Discharge S: Hoping to go home today, feels her left leg swelling, cellulitis is improving, no fevers.  BP (!) 158/68 (BP Location: Left Arm)   Pulse 87   Temp (!) 97.4 F (36.3 C)   Resp 18   Ht 5\' 7"  (1.702 m)   Wt 118 kg   LMP  (LMP Unknown)   SpO2 94%   BMI 40.74 kg/m    Physical Exam: General: Alert and awake oriented x3 not in any acute distress. HEENT: anicteric sclera, pupils reactive to light and accommodation CVS: S1-S2 clear no murmur rubs or gallops Chest: clear to auscultation bilaterally, no wheezing rales or rhonchi Abdomen: soft nontender, nondistended, normal bowel sounds Extremities: no cyanosis, clubbing or edema noted bilaterally Neuro: Cranial nerves II-XII intact, no focal neurological deficits Skin: Left leg cellulitis improving   Get Medicines reviewed and adjusted: Please take all your medications with you for your next visit with your Primary MD  Please request your Primary MD to go over all hospital tests and procedure/radiological results at the follow up. Please ask your Primary MD to get all Hospital records sent to his/her office.  If you experience worsening of your admission symptoms, develop shortness of breath, life threatening emergency, suicidal or homicidal thoughts you must seek medical attention immediately by calling 911 or calling your MD immediately  if symptoms less severe.  You must read complete instructions/literature along with all the possible adverse reactions/side effects  for all the Medicines you take and that have been prescribed to you. Take any new Medicines after you have completely understood and accept all the possible adverse reactions/side effects.   Do not drive when taking pain medications.   Do not take more than prescribed Pain, Sleep and Anxiety Medications  Special Instructions: If you have smoked or chewed Tobacco  in the last 2 yrs please stop smoking, stop any regular Alcohol  and or any Recreational drug use.  Wear Seat belts while driving.  Please note  You were cared for by a hospitalist during your hospital stay. Once you are discharged, your primary care physician will handle any further medical issues. Please note that NO REFILLS for any discharge medications will be authorized  once you are discharged, as it is imperative that you return to your primary care physician (or establish a relationship with a primary care physician if you do not have one) for your aftercare needs so that they can reassess your need for medications and monitor your lab values.   The results of significant diagnostics from this hospitalization (including imaging, microbiology, ancillary and laboratory) are listed below for reference.      Procedures/Studies:  DG Tibia/Fibula Left  Result Date: 04/17/2020 CLINICAL DATA:  Cellulitis EXAM: LEFT TIBIA AND FIBULA - 2 VIEW COMPARISON:  None. FINDINGS: No fracture or malalignment. No periostitis or bone destruction. Diffuse soft tissue edema without emphysema. IMPRESSION: Soft tissue edema.  No acute osseous abnormality Electronically Signed   By: Donavan Foil M.D.   On: 04/17/2020 20:33   CT Abdomen Pelvis W Contrast  Result Date: 04/16/2020 CLINICAL DATA:  Breast carcinoma. Currently undergoing chemotherapy. Unilateral left leg swelling for 8 weeks. Diarrhea. EXAM: CT ABDOMEN AND PELVIS WITH CONTRAST TECHNIQUE: Multidetector CT imaging of the abdomen and pelvis was performed using the standard protocol following bolus administration of intravenous contrast. CONTRAST:  133mL OMNIPAQUE IOHEXOL 300 MG/ML  SOLN COMPARISON:  None. FINDINGS: Lower Chest: No acute findings. Hepatobiliary: Mild diffuse hepatic steatosis. Subcentimeter low-attenuation lesion in the anterior right lobe has nonspecific characteristics and is difficult to characterize due to its small size. No other liver lesions identified. At least 1 gallstone is seen measuring 2 cm, however there is no evidence of cholecystitis or biliary ductal dilatation. Pancreas:  No mass or inflammatory changes. Spleen: Within normal limits in size and appearance. Adrenals/Urinary Tract: 2 cm homogeneous left adrenal mass is seen which has nonspecific characteristics. No renal masses identified. No evidence  of ureteral calculi or hydronephrosis. Unremarkable unopacified urinary bladder. Stomach/Bowel: No evidence of obstruction, inflammatory process or abnormal fluid collections. Normal appendix visualized. Diverticulosis is seen mainly involving the sigmoid colon, however there is no evidence of diverticulitis. Vascular/Lymphatic: No pathologically enlarged lymph nodes. No abdominal aortic aneurysm. No evidence of IVC iliac vein thrombosis. Reproductive: Fibroid seen in the right uterine fundus measuring 4.3 cm. Adnexal regions are unremarkable. Other:  None. Musculoskeletal: No suspicious bone lesions identified. Lumbar spine fusion hardware noted at L4-5. IMPRESSION: 1. 2 cm indeterminate left adrenal mass, and sub-cm indeterminate liver lesion. Abdomen MRI without and with contrast is recommended for further characterization. 2. Mild hepatic steatosis. 3. Cholelithiasis. No radiographic evidence of cholecystitis. 4. Colonic diverticulosis, without radiographic evidence of diverticulitis. 5. 4.3 cm uterine fibroid. Electronically Signed   By: Marlaine Hind M.D.   On: 04/16/2020 15:04   CT TIBIA FIBULA LEFT W CONTRAST  Result Date: 04/18/2020 CLINICAL DATA:  Cellulitis of the left lower leg. EXAM: CT OF  THE LOWER RIGHT EXTREMITY WITH CONTRAST TECHNIQUE: Multidetector CT imaging of the lower right extremity was performed according to the standard protocol following intravenous contrast administration. COMPARISON:  None. CONTRAST:  178mL OMNIPAQUE IOHEXOL 300 MG/ML  SOLN FINDINGS: Bones/Joint/Cartilage The bones of the knee and the tibia and fibula and bones of the ankle joint appear normal. Minimal right knee effusion. No ankle effusion. Muscles and Tendons Normal. Soft tissues There circumferential subcutaneous edema throughout the lower leg, nonspecific. This could represent cellulitis. No definable abscesses. IMPRESSION: 1. Circumferential subcutaneous edema throughout the lower leg, nonspecific. This could  represent cellulitis. 2. No definable abscesses. 3. Minimal right knee effusion. Electronically Signed   By: Lorriane Shire M.D.   On: 04/18/2020 12:50   CT FEMUR LEFT W CONTRAST  Result Date: 04/18/2020 CLINICAL DATA:  Cellulitis of the left lower extremity. Left leg swelling. Left leg pain. EXAM: CT OF THE LOWER RIGHT EXTREMITY WITH CONTRAST TECHNIQUE: Multidetector CT imaging of the lower right extremity was performed according to the standard protocol following intravenous contrast administration. COMPARISON:  Radiographs dated 04/17/2020 CONTRAST:  148mL OMNIPAQUE IOHEXOL 300 MG/ML  SOLN FINDINGS: Bones/Joint/Cartilage The bones of the left hip appear normal. Left femur is normal. No hip effusion. Minimal left knee effusion. Muscles and Tendons Muscles and tendons of the left hip and left thigh are normal. Soft tissues There is skin thickening and subcutaneous edema in the lateral aspect of mid and distal left thigh without a definable abscess. IMPRESSION: 1. Skin thickening and subcutaneous edema in the lateral aspect of the mid and distal left thigh without a definable abscess. The finding is consistent with cellulitis. 2. Minimal left knee effusion. Electronically Signed   By: Lorriane Shire M.D.   On: 04/18/2020 12:47   CT FOOT LEFT W CONTRAST  Result Date: 04/18/2020 CLINICAL DATA:  Cellulitis of the left lower extremity. EXAM: CT OF THE LEFT FOOT WITH CONTRAST TECHNIQUE: Multidetector CT imaging of the left foot was performed according to the standard protocol following intravenous contrast administration. COMPARISON:  Radiographs dated 04/17/2020 CONTRAST:  110mL OMNIPAQUE IOHEXOL 300 MG/ML  SOLN FINDINGS: Bones/Joint/Cartilage Normal. No joint effusions. No osteomyelitis. Muscles and Tendons Normal. Soft tissues There is subcutaneous edema at the medial and lateral aspects of the foot and extending onto the dorsum of the foot. No abscesses. IMPRESSION: 1. Subcutaneous edema of the foot and ankle  consistent with cellulitis. 2. No abscess or osteomyelitis. Electronically Signed   By: Lorriane Shire M.D.   On: 04/18/2020 12:53   DG Foot Complete Left  Result Date: 04/17/2020 CLINICAL DATA:  Cellulitis EXAM: LEFT FOOT - COMPLETE 3+ VIEW COMPARISON:  None. FINDINGS: No fracture or malalignment. Diffuse soft tissue edema. No erosion or bony destruction. IMPRESSION: No acute osseous abnormality Electronically Signed   By: Donavan Foil M.D.   On: 04/17/2020 20:34   DG FEMUR 1V LEFT  Result Date: 04/17/2020 CLINICAL DATA:  Cellulitis EXAM: LEFT FEMUR 1 VIEW COMPARISON:  None. FINDINGS: No fracture or malalignment. Soft tissue edema without soft tissue emphysema. IMPRESSION: No acute osseous abnormality Electronically Signed   By: Donavan Foil M.D.   On: 04/17/2020 20:35   VAS [place] LOWER EXTREMITY VENOUS (DVT)  Result Date: 04/15/2020  Lower Venous DVTStudy Indications: Swelling, and Pain.  Risk Factors: Cancer. Limitations: Poor ultrasound/tissue interface, body habitus and patient positioning. Comparison Study: 03/25/2020 - Negative for DVT Performing Technologist: Oliver Hum RVT  Examination Guidelines: A complete evaluation includes B-mode imaging, spectral Doppler, color Doppler, and power  Doppler as needed of all accessible portions of each vessel. Bilateral testing is considered an integral part of a complete examination. Limited examinations for reoccurring indications may be performed as noted. The reflux portion of the exam is performed with the patient in reverse Trendelenburg.  +-----+---------------+---------+-----------+----------+--------------+ RIGHTCompressibilityPhasicitySpontaneityPropertiesThrombus Aging +-----+---------------+---------+-----------+----------+--------------+ CFV  Full           Yes      Yes                                 +-----+---------------+---------+-----------+----------+--------------+    +---------+---------------+---------+-----------+----------+--------------+ LEFT     CompressibilityPhasicitySpontaneityPropertiesThrombus Aging +---------+---------------+---------+-----------+----------+--------------+ CFV      Full           Yes      Yes                                 +---------+---------------+---------+-----------+----------+--------------+ SFJ      Full                                                        +---------+---------------+---------+-----------+----------+--------------+ FV Prox  Full                                                        +---------+---------------+---------+-----------+----------+--------------+ FV Mid                  Yes      Yes                                 +---------+---------------+---------+-----------+----------+--------------+ FV Distal               Yes      Yes                                 +---------+---------------+---------+-----------+----------+--------------+ PFV      Full                                                        +---------+---------------+---------+-----------+----------+--------------+ POP      Full           Yes      Yes                                 +---------+---------------+---------+-----------+----------+--------------+ PTV      Full                                                        +---------+---------------+---------+-----------+----------+--------------+ PERO     Full                                                        +---------+---------------+---------+-----------+----------+--------------+  Summary: RIGHT: - No evidence of common femoral vein obstruction.  LEFT: - There is no evidence of deep vein thrombosis in the lower extremity. However, portions of this examination were limited- see technologist comments above.  - No cystic structure found in the popliteal fossa.  *See table(s) above for measurements and observations.  Electronically signed by Deitra Mayo MD on 04/15/2020 at 6:51:36 PM.    Final    VAS [place] LOWER EXTREMITY VENOUS (DVT)  Result Date: 03/25/2020  Lower Venous DVTStudy Indications: Swelling.  Risk Factors: Chemotherapy. Comparison Study: no prior Performing Technologist: June Leap RDMS, RVT  Examination Guidelines: A complete evaluation includes B-mode imaging, spectral Doppler, color Doppler, and power Doppler as needed of all accessible portions of each vessel. Bilateral testing is considered an integral part of a complete examination. Limited examinations for reoccurring indications may be performed as noted. The reflux portion of the exam is performed with the patient in reverse Trendelenburg.  +-----+---------------+---------+-----------+----------+--------------+ RIGHTCompressibilityPhasicitySpontaneityPropertiesThrombus Aging +-----+---------------+---------+-----------+----------+--------------+ CFV  Full           Yes      Yes                                 +-----+---------------+---------+-----------+----------+--------------+   +---------+---------------+---------+-----------+----------+--------------+ LEFT     CompressibilityPhasicitySpontaneityPropertiesThrombus Aging +---------+---------------+---------+-----------+----------+--------------+ CFV      Full           Yes      Yes                                 +---------+---------------+---------+-----------+----------+--------------+ SFJ      Full                                                        +---------+---------------+---------+-----------+----------+--------------+ FV Prox  Full                                                        +---------+---------------+---------+-----------+----------+--------------+ FV Mid   Full                                                        +---------+---------------+---------+-----------+----------+--------------+ FV DistalFull                                                         +---------+---------------+---------+-----------+----------+--------------+ PFV      Full                                                        +---------+---------------+---------+-----------+----------+--------------+ POP  Full           Yes      Yes                                 +---------+---------------+---------+-----------+----------+--------------+ PTV      Full                                                        +---------+---------------+---------+-----------+----------+--------------+ PERO     Full                                                        +---------+---------------+---------+-----------+----------+--------------+     Summary: RIGHT: - No evidence of common femoral vein obstruction.  LEFT: - There is no evidence of deep vein thrombosis in the lower extremity.  - No cystic structure found in the popliteal fossa.  *See table(s) above for measurements and observations. Electronically signed by Deitra Mayo MD on 03/25/2020 at 3:14:06 PM.    Final       LAB RESULTS: Basic Metabolic Panel: Recent Labs  Lab 04/17/20 1841 04/18/20 0612 04/23/20 0607 04/24/20 0618  NA 144   < > 141 140  K 2.8*   < > 3.7 3.7  CL 112*   < > 104 103  CO2 24   < > 27 28  GLUCOSE 89   < > 116* 133*  BUN 20   < > 14 16  CREATININE 0.51   < > 0.69 0.72  CALCIUM 7.0*   < > 8.5* 8.5*  MG 1.7  --   --   --    < > = values in this interval not displayed.   Liver Function Tests: Recent Labs  Lab 04/17/20 1841  AST 23  ALT 22  ALKPHOS 51  BILITOT 0.5  PROT 5.4*  ALBUMIN 3.0*   No results for input(s): LIPASE, AMYLASE in the last 168 hours. No results for input(s): AMMONIA in the last 168 hours. CBC: Recent Labs  Lab 04/23/20 0607 04/23/20 0607 04/24/20 0618  WBC 4.9  --  4.9  NEUTROABS 3.3   < > 3.1  HGB 9.8*  --  9.6*  HCT 31.1*  --  30.8*  MCV 103.7*   < > 104.4*  PLT 231  --  272   < > = values  in this interval not displayed.   Cardiac Enzymes: No results for input(s): CKTOTAL, CKMB, CKMBINDEX, TROPONINI in the last 168 hours. BNP: Invalid input(s): POCBNP CBG: No results for input(s): GLUCAP in the last 168 hours.     Disposition and Follow-up: Discharge Instructions    Diet - low sodium heart healthy   Complete by: As directed    Increase activity slowly   Complete by: As directed        DISPOSITION: home    [place]    Maurice Small, MD. Schedule an appointment as soon as possible for a visit in 2 week(s).   Specialty: Family Medicine Contact information: [address] [place] [address] [phone]  Time coordinating discharge:  35 mins   Signed:   Estill Cotta M.D. [facility] 04/24/2020, 12:13 PM

## 2020-04-28 MED FILL — Dexamethasone Sodium Phosphate Inj 100 MG/10ML: INTRAMUSCULAR | Qty: 1 | Status: AC

## 2020-04-28 NOTE — Progress Notes (Signed)
Patient Care Team: Maurice Small, MD as PCP - General (Family Medicine) Rockwell Germany, RN as Oncology Nurse Navigator Tressie Ellis, Paulette Blanch, RN as Oncology Nurse Navigator Erroll Luna, MD as Consulting Physician (General Surgery) Nicholas Lose, MD as Consulting Physician (Hematology and Oncology) Gery Pray, MD as Consulting Physician (Radiation Oncology)  DIAGNOSIS:    ICD-10-CM   1. Malignant neoplasm of upper-inner quadrant of right breast in female, estrogen receptor positive ([place])  C50.211    Z17.0     SUMMARY OF ONCOLOGIC HISTORY: Oncology History  Malignant neoplasm of upper-inner quadrant of right breast in female, estrogen receptor positive ([place])  12/26/2019 Initial Diagnosis   Patient palpated a right breast lump x1wk. Mammogram and US showed two adjacent masses at the 2 o'clock position measuring 1.4cm and 0.6cm, calcifications in the outer right breast at the 9 o'clock position, no right axillary adenopathy. Biopsy showed IDC at the 2 o'clock position, grade 3, HER-2 + (3+), ER+ 90%, PR+ 30%, Ki67 30%, and DCIS in the upper outer right breast, high grade, ER+ 95%, PR 90%.   01/22/2020 Surgery   Right breast lumpectomy x2 ([facility]): Medial position: IDC, grade 3, 2.3cm, with high grade DCIS, clear margins Lateral position: high grade DCIS, clear margins, 4 right axillary lymph nodes negative    01/22/2020 Cancer Staging   Staging form: Breast, AJCC 8th Edition - Pathologic stage from 01/22/2020: Stage IA (pT2, pN0, cM0, G3, ER+, PR+, HER2+) - Signed by Gardenia Phlegm, NP on 02/06/2020   02/19/2020 -  Chemotherapy   The patient had PACLitaxel (TAXOL) 180 mg in sodium chloride 0.9 % 250 mL chemo infusion (</= 28m/m2), 80 mg/m2 = 180 mg, Intravenous,  Once, 3 of 3 cycles Dose modification: 65 mg/m2 (original dose 80 mg/m2, Cycle 2, Reason: Dose not tolerated), 65 mg/m2 (original dose 80 mg/m2, Cycle 2, Reason: Dose not tolerated) Administration: 180 mg (02/19/2020), 180 mg  (02/26/2020), 180 mg (03/18/2020), 180 mg (03/04/2020), 180 mg (03/11/2020), 144 mg (03/25/2020), 144 mg (04/01/2020), 144 mg (04/08/2020), 144 mg (04/15/2020) trastuzumab-anns (KANJINTI) 441 mg in sodium chloride 0.9 % 250 mL chemo infusion, 4 mg/kg = 441 mg (100 % of original dose 4 mg/kg), Intravenous,  Once, 3 of 16 cycles Dose modification: 4 mg/kg (original dose 4 mg/kg, Cycle 1, Reason: Other (see comments)), 6 mg/kg (original dose 6 mg/kg, Cycle 4, Reason: Other (see comments), Comment: insurance preferred biosimilar), 6 mg/kg (original dose 6 mg/kg, Cycle 10, Reason: Other (see comments)) Administration: 441 mg (02/19/2020), 210 mg (02/26/2020), 210 mg (03/04/2020), 210 mg (03/11/2020), 210 mg (03/18/2020), 210 mg (03/25/2020), 210 mg (04/01/2020), 210 mg (04/08/2020), 210 mg (04/15/2020)  for chemotherapy treatment.      CHIEF COMPLIANT: Cycle10Taxol Herceptin  INTERVAL HISTORY: Isabel MOUDYis a 69y.o. with above-mentioned history of right breast cancerwhounderwent a right breast lumpectomy x2and is currently on adjuvant chemotherapy with Taxol Herceptin.She was admitted from 04/17/20-04/24/20 after left lower extremity cellulitis failed to improve for several weeks. [place] negative for DVT. She was placed on oral antibiotics and lasix. She presents to the clinic todayforfollow-up of her hospitalization andtreatment. Patient complains of continued problems with pain in the left leg as well as the left hip.  She tells me that the Percocets are not working.  She wants to try something else.  She continues to have likes leg swelling.  It is also slightly erythematous.  This is in spite of broad-spectrum antibiotics.  She has got extensive bruises throughout her arms.  ALLERGIES:  is allergic to [employer] tartrate].  MEDICATIONS:  Current Outpatient Medications  Medication Sig Dispense Refill  . amitriptyline (ELAVIL) 25 MG tablet Take 75 mg by mouth at bedtime.     Marland Kitchen amLODipine (NORVASC) 5 MG  tablet Take 7.5 mg by mouth at bedtime.     . cephALEXin (KEFLEX) 500 MG capsule Take 1 capsule (500 mg total) by mouth 4 (four) times daily for 5 days. 20 capsule 0  . diazepam (VALIUM) 10 MG tablet Take 1 tablet (10 mg total) by mouth at bedtime as needed for anxiety. (Patient taking differently: Take 10 mg by mouth at bedtime as needed for sleep. ) 30 tablet 2  . FLUoxetine (PROZAC) 40 MG capsule Take 80 mg by mouth at bedtime.     . furosemide (LASIX) 20 MG tablet Take 1 tablet (20 mg total) by mouth daily as needed for fluid or edema. Please take daily for 3 days, then as needed for leg swelling 30 tablet 0  . gabapentin (NEURONTIN) 100 MG capsule Take 1 capsule (100 mg total) by mouth 3 (three) times daily. 90 capsule 1  . HYDROmorphone (DILAUDID) 2 MG tablet Take 1 tablet (2 mg total) by mouth every 6 (six) hours as needed for severe pain. 60 tablet 0  . ibuprofen (ADVIL,MOTRIN) 200 MG tablet Take 400-600 mg by mouth 2 (two) times daily as needed for moderate pain.     Marland Kitchen levothyroxine (SYNTHROID, LEVOTHROID) 137 MCG tablet Take 137 mcg by mouth at bedtime.     . lidocaine-prilocaine (EMLA) cream Apply to affected area once (Patient taking differently: Apply 1 application topically daily as needed (prior to port being accessed.). Apply to affected area once) 30 g 3  . ondansetron (ZOFRAN) 8 MG tablet Take 1 tablet (8 mg total) by mouth 2 (two) times daily as needed (Nausea or vomiting). (Patient not taking: Reported on 04/17/2020) 30 tablet 1  . prochlorperazine (COMPAZINE) 10 MG tablet Take 1 tablet (10 mg total) by mouth every 6 (six) hours as needed (Nausea or vomiting). (Patient not taking: Reported on 04/08/2020) 30 tablet 1   No current facility-administered medications for this visit.    PHYSICAL EXAMINATION: ECOG PERFORMANCE STATUS: 2 - Symptomatic, <50% confined to bed  Vitals:   04/29/20 1236  BP: (!) 143/73  Pulse: 98  Resp: 20  Temp: 97.9 F (36.6 C)  SpO2: 100%   Filed  Weights   04/29/20 1236  Weight: 247 lb 14.4 oz (112.4 kg)    LABORATORY DATA:  I have reviewed the data as listed CMP Latest Ref Rng & Units 04/29/2020 04/24/2020 04/23/2020  Glucose 70 - 99 mg/dL 105(H) 133(H) 116(H)  BUN 8 - 23 mg/dL '11 16 14  ' Creatinine 0.44 - 1.00 mg/dL 0.75 0.72 0.69  Sodium 135 - 145 mmol/L 143 140 141  Potassium 3.5 - 5.1 mmol/L 3.1(L) 3.7 3.7  Chloride 98 - 111 mmol/L 102 103 104  CO2 22 - 32 mmol/L '29 28 27  ' Calcium 8.9 - 10.3 mg/dL 8.5(L) 8.5(L) 8.5(L)  Total Protein 6.5 - 8.1 g/dL 6.3(L) - -  Total Bilirubin 0.3 - 1.2 mg/dL 0.5 - -  Alkaline Phos 38 - 126 U/L 102 - -  AST 15 - 41 U/L 23 - -  ALT 0 - 44 U/L 24 - -    Lab Results  Component Value Date   WBC 6.5 04/29/2020   HGB 10.2 (L) 04/29/2020   HCT 32.3 (L) 04/29/2020   MCV 101.3 (  H) 04/29/2020   PLT 241 04/29/2020   NEUTROABS 4.4 04/29/2020    ASSESSMENT & PLAN:  Malignant neoplasm of upper-inner quadrant of right breast in female, estrogen receptor positive ([place]) 01/22/2020: Right medial lumpectomy: Grade 3 IDC 2.3 cm with high-grade DCIS with necrosis, margins negative, negative for lymphovascular or perineural invasion, Right lateral lumpectomy: Isolated foci of DCIS high-grade, resection margins negative 4 lymph nodes negative, ER 90%, PR 30%, HER-2 3+ positive, Ki-67 30%  Treatment plan: 1.Adjuvant chemotherapy with Taxol Herceptin weekly x 8 (stopped early because of lower extremity edema) followed by Herceptin maintenance 2.Adjuvant radiation therapy 3.Followed by adjuvant antiestrogen therapy. ----------------------------------------------------------------------------------------------------------------------------------------------------------- Current treatment: Herceptin maintenance and recommendation for adjuvant radiation Echocardiogram 02/04/2020: EF 60 to 65%  Chemo toxicities: 1.  Moderate to severe fatigue: Due to Taxol chemo  2. Lower extremity edema left leg.     Required hospitalization for cellulitis treated with IV antibiotics. 3.  Major depression: Currently on Prozac. 4.  Diarrhea: Due to Herceptin.  I encouraged her to take 2 Imodium's every morning to stop the diarrhea.   Leg pain: She tells me the Percocet is not working.  I will discontinue Percocet and switch her to Dilaudid. Hypokalemia: Potassium 3.1: We will administer 40 mEq of oral potassium in the clinic and send her home with 20 mEq of potassium chloride daily.  Return to clinicevery 3 weeks for Herceptin maintenance and every 6 weeks to follow-up with me..    No orders of the defined types were placed in this encounter.  The patient has a good understanding of the overall plan. she agrees with it. she will call with any problems that may develop before the next visit here.  Total time spent: 30 mins including face to face time and time spent for planning, charting and coordination of care  Nicholas Lose, MD 04/29/2020  I, Cloyde Reams Dorshimer, am acting as scribe for Dr. Nicholas Lose.  I have reviewed the above documentation for accuracy and completeness, and I agree with the above.

## 2020-04-29 ENCOUNTER — Telehealth: Payer: Self-pay | Admitting: *Deleted

## 2020-04-29 ENCOUNTER — Other Ambulatory Visit: Payer: Self-pay | Admitting: *Deleted

## 2020-04-29 ENCOUNTER — Inpatient Hospital Stay: Payer: Medicare PPO

## 2020-04-29 ENCOUNTER — Other Ambulatory Visit: Payer: Self-pay

## 2020-04-29 ENCOUNTER — Encounter: Payer: Self-pay | Admitting: *Deleted

## 2020-04-29 ENCOUNTER — Inpatient Hospital Stay: Payer: Medicare PPO | Attending: Hematology and Oncology

## 2020-04-29 ENCOUNTER — Inpatient Hospital Stay (HOSPITAL_BASED_OUTPATIENT_CLINIC_OR_DEPARTMENT_OTHER): Payer: Medicare PPO | Admitting: Hematology and Oncology

## 2020-04-29 DIAGNOSIS — Z5112 Encounter for antineoplastic immunotherapy: Secondary | ICD-10-CM | POA: Diagnosis present

## 2020-04-29 DIAGNOSIS — Z95828 Presence of other vascular implants and grafts: Secondary | ICD-10-CM

## 2020-04-29 DIAGNOSIS — C50211 Malignant neoplasm of upper-inner quadrant of right female breast: Secondary | ICD-10-CM | POA: Diagnosis present

## 2020-04-29 DIAGNOSIS — Z170 Estrogen receptor positive status [ER+]: Secondary | ICD-10-CM | POA: Diagnosis not present

## 2020-04-29 DIAGNOSIS — E876 Hypokalemia: Secondary | ICD-10-CM

## 2020-04-29 LAB — CMP (CANCER CENTER ONLY)
ALT: 24 U/L (ref 0–44)
AST: 23 U/L (ref 15–41)
Albumin: 3.3 g/dL — ABNORMAL LOW (ref 3.5–5.0)
Alkaline Phosphatase: 102 U/L (ref 38–126)
Anion gap: 12 (ref 5–15)
BUN: 11 mg/dL (ref 8–23)
CO2: 29 mmol/L (ref 22–32)
Calcium: 8.5 mg/dL — ABNORMAL LOW (ref 8.9–10.3)
Chloride: 102 mmol/L (ref 98–111)
Creatinine: 0.75 mg/dL (ref 0.44–1.00)
GFR, Est AFR Am: >60 mL/min (ref >60)
GFR, Estimated: >60 mL/min (ref >60)
Glucose, Bld: 105 mg/dL — ABNORMAL HIGH (ref 70–99)
Potassium: 3.1 mmol/L — ABNORMAL LOW (ref 3.5–5.1)
Sodium: 143 mmol/L (ref 135–145)
Total Bilirubin: 0.5 mg/dL (ref 0.3–1.2)
Total Protein: 6.3 g/dL — ABNORMAL LOW (ref 6.5–8.1)

## 2020-04-29 LAB — CBC WITH DIFFERENTIAL (CANCER CENTER ONLY)
Abs Immature Granulocytes: 0.05 10*3/uL (ref 0.00–0.07)
Basophils Absolute: 0 10*3/uL (ref 0.0–0.1)
Basophils Relative: 1 %
Eosinophils Absolute: 0.2 10*3/uL (ref 0.0–0.5)
Eosinophils Relative: 3 %
HCT: 32.3 % — ABNORMAL LOW (ref 36.0–46.0)
Hemoglobin: 10.2 g/dL — ABNORMAL LOW (ref 12.0–15.0)
Immature Granulocytes: 1 %
Lymphocytes Relative: 18 %
Lymphs Abs: 1.2 10*3/uL (ref 0.7–4.0)
MCH: 32 pg (ref 26.0–34.0)
MCHC: 31.6 g/dL (ref 30.0–36.0)
MCV: 101.3 fL — ABNORMAL HIGH (ref 80.0–100.0)
Monocytes Absolute: 0.7 10*3/uL (ref 0.1–1.0)
Monocytes Relative: 11 %
Neutro Abs: 4.4 10*3/uL (ref 1.7–7.7)
Neutrophils Relative %: 66 %
Platelet Count: 241 10*3/uL (ref 150–400)
RBC: 3.19 MIL/uL — ABNORMAL LOW (ref 3.87–5.11)
RDW: 16 % — ABNORMAL HIGH (ref 11.5–15.5)
WBC Count: 6.5 10*3/uL (ref 4.0–10.5)
nRBC: 0 % (ref 0.0–0.2)

## 2020-04-29 MED ORDER — DIPHENHYDRAMINE HCL 25 MG PO CAPS
50.0000 mg | ORAL_CAPSULE | Freq: Once | ORAL | Status: AC
  Administered 2020-04-29: 50 mg via ORAL

## 2020-04-29 MED ORDER — SODIUM CHLORIDE 0.9% FLUSH
10.0000 mL | INTRAVENOUS | Status: DC | PRN
  Administered 2020-04-29: 10 mL via INTRAVENOUS
  Filled 2020-04-29: qty 10

## 2020-04-29 MED ORDER — SODIUM CHLORIDE 0.9 % IV SOLN
Freq: Once | INTRAVENOUS | Status: AC
  Filled 2020-04-29: qty 250

## 2020-04-29 MED ORDER — POTASSIUM CHLORIDE CRYS ER 20 MEQ PO TBCR
EXTENDED_RELEASE_TABLET | ORAL | Status: AC
  Filled 2020-04-29: qty 2

## 2020-04-29 MED ORDER — POTASSIUM CHLORIDE CRYS ER 20 MEQ PO TBCR
40.0000 meq | EXTENDED_RELEASE_TABLET | Freq: Once | ORAL | Status: AC
  Administered 2020-04-29: 40 meq via ORAL

## 2020-04-29 MED ORDER — ACETAMINOPHEN 325 MG PO TABS
650.0000 mg | ORAL_TABLET | Freq: Once | ORAL | Status: AC
  Administered 2020-04-29: 650 mg via ORAL

## 2020-04-29 MED ORDER — SODIUM CHLORIDE 0.9% FLUSH
10.0000 mL | INTRAVENOUS | Status: DC | PRN
  Administered 2020-04-29: 10 mL
  Filled 2020-04-29: qty 10

## 2020-04-29 MED ORDER — HEPARIN SOD (PORK) LOCK FLUSH 100 UNIT/ML IV SOLN
500.0000 [IU] | Freq: Once | INTRAVENOUS | Status: AC | PRN
  Administered 2020-04-29: 500 [IU]
  Filled 2020-04-29: qty 5

## 2020-04-29 MED ORDER — ACETAMINOPHEN 325 MG PO TABS
ORAL_TABLET | ORAL | Status: AC
  Filled 2020-04-29: qty 2

## 2020-04-29 MED ORDER — POTASSIUM CHLORIDE CRYS ER 20 MEQ PO TBCR
20.0000 meq | EXTENDED_RELEASE_TABLET | Freq: Every day | ORAL | 0 refills | Status: DC
Start: 2020-04-29 — End: 2020-08-04

## 2020-04-29 MED ORDER — HYDROMORPHONE HCL 2 MG PO TABS: 2.0000 mg | ORAL_TABLET | Freq: Four times a day (QID) | ORAL | 0 refills | Status: DC | PRN

## 2020-04-29 MED ORDER — TRASTUZUMAB-ANNS CHEMO 150 MG IV SOLR
6.0000 mg/kg | Freq: Once | INTRAVENOUS | Status: AC
  Administered 2020-04-29: 651 mg via INTRAVENOUS
  Filled 2020-04-29: qty 31

## 2020-04-29 MED ORDER — DIPHENHYDRAMINE HCL 25 MG PO CAPS
ORAL_CAPSULE | ORAL | Status: AC
  Filled 2020-04-29: qty 2

## 2020-04-29 NOTE — Assessment & Plan Note (Signed)
01/22/2020: Right medial lumpectomy: Grade 3 IDC 2.3 cm with high-grade DCIS with necrosis, margins negative, negative for lymphovascular or perineural invasion, Right lateral lumpectomy: Isolated foci of DCIS high-grade, resection margins negative 4 lymph nodes negative, ER 90%, PR 30%, HER-2 3+ positive, Ki-67 30%  Treatment plan: 1.Adjuvant chemotherapy with Taxol Herceptin weekly x 8 (stopped early because of lower extremity edema) followed by Herceptin maintenance 2.Adjuvant radiation therapy 3.Followed by adjuvant antiestrogen therapy. ----------------------------------------------------------------------------------------------------------------------------------------------------------- Current treatment: Herceptin maintenance and recommendation for adjuvant radiation Echocardiogram 02/04/2020: EF 60 to 65%  Chemo toxicities: 1.  Moderate to severe fatigue: Due to Taxol chemo  2. Lower extremity edema left leg.    Required hospitalization for cellulitis treated with IV antibiotics. 3.  Major depression: Currently on Prozac. 4.  Diarrhea: Due to Herceptin.  I encouraged her to take 2 Imodium's every morning to stop the diarrhea.     Return to clinicevery 3 weeks for Herceptin maintenance. 

## 2020-04-29 NOTE — Telephone Encounter (Signed)
Spoke to pt concerning chemo schedule. Confirmed appts for 5/11. No further needs voiced.

## 2020-04-29 NOTE — Patient Instructions (Signed)
[facility] Discharge Instructions for Patients Receiving Chemotherapy  Today you received the following chemotherapy agents trastuzumab.  To help prevent nausea and vomiting after your treatment, we encourage you to take your nausea medication as directed.    If you develop nausea and vomiting that is not controlled by your nausea medication, call the clinic.   BELOW ARE SYMPTOMS THAT SHOULD BE REPORTED IMMEDIATELY:  *FEVER GREATER THAN 100.5 F  *CHILLS WITH OR WITHOUT FEVER  NAUSEA AND VOMITING THAT IS NOT CONTROLLED WITH YOUR NAUSEA MEDICATION  *UNUSUAL SHORTNESS OF BREATH  *UNUSUAL BRUISING OR BLEEDING  TENDERNESS IN MOUTH AND THROAT WITH OR WITHOUT PRESENCE OF ULCERS  *URINARY PROBLEMS  *BOWEL PROBLEMS  UNUSUAL RASH Items with * indicate a potential emergency and should be followed up as soon as possible.  Feel free to call the clinic should you have any questions or concerns. The clinic phone number is [phone].  Please show the CHEMO ALERT CARD at check-in to the Emergency Department and triage nurse.   

## 2020-04-29 NOTE — Progress Notes (Signed)
Pt K 3.1.  Per MD pt to receive 40 mEq p.o. potassium while in infusion.  Pt to also start taking 20 mEq potassium daily.  Prescription sent to pharmacy on file and pt educated and verbalized understanding.

## 2020-04-29 NOTE — Progress Notes (Signed)
Pt potassium 3.1.  Per MD pt to begin taking oral potassium 20 mEq daily.  prescription sent to pharmacy on file.

## 2020-04-29 NOTE — Progress Notes (Signed)
Nutrition Assessment   Reason for Assessment:  Patient identified on Malnutrition Screening tool for weight loss and poor appetite   ASSESSMENT:  69 year old female with right breast cancer.  Past medical history of HTN, chronic diarrhea.  Patient receiving chemotherapy.  Noted recent hospital visit for left leg cellulitis.    Met with patient during infusion.  Reports that her appetite is good.  May not have been too good in the hospital but overall she is eating well.  Reports that she generally has 2 eggs and toast for breakfast sometimes late breakfast and will skip lunch.  Lunch maybe leftovers if breakfast is not too late.  Dinner is cooked meal of meat and couple of sides.    Denies nausea.  Reports diarrhea but this is chronic.     Medications:  Lasis, zofran, compazine   Labs: K 3.1   Anthropometrics:   Height: 67 inches Weight: 247 lb 14.4 oz 5/11 4/28 252 lb  4/20 249 lb BMI: 38 2% weight loss in the few weeks, concerning   NUTRITION DIAGNOSIS: Inadequate oral intake related to recent hospitalization, cancer related treatment side effects as evidenced by 2% weight loss in the last few weeks   INTERVENTION:  Discussed importance of well balanced diet including good sources of protein.  Contact information provided and encouraged patient to reach out if needed   Next Visit: patient to contact RD as needed  Jenet Durio B. Zenia Resides, [place], [place] Registered Dietitian [phone] (pager)

## 2020-04-30 ENCOUNTER — Encounter: Payer: Self-pay | Admitting: *Deleted

## 2020-04-30 DIAGNOSIS — C50211 Malignant neoplasm of upper-inner quadrant of right female breast: Secondary | ICD-10-CM

## 2020-04-30 DIAGNOSIS — Z170 Estrogen receptor positive status [ER+]: Secondary | ICD-10-CM

## 2020-05-05 ENCOUNTER — Telehealth: Payer: Self-pay | Admitting: *Deleted

## 2020-05-05 ENCOUNTER — Encounter: Payer: Self-pay | Admitting: *Deleted

## 2020-05-05 ENCOUNTER — Telehealth: Payer: Self-pay | Admitting: Hematology and Oncology

## 2020-05-05 NOTE — Telephone Encounter (Signed)
Received call from pt husband Doren Custard to report that pt has fallen at home x4 in the last week.  States that pt began falling after she started taking Dilaudid 2 mg tablet PRN.  Also states pt has an increase in confusion since taking the Dilaudid as well.  Pt husband reports that pt is being seen by Dr[facility]je[facility] pain clinic and is on medication through them as well.  RN notified MD.  Orders received to call Dr. Maryjean Ka office and get a list of pt medication to update in our system and to have Dr. Maryjean Ka prescribe all pain medication from here on out.

## 2020-05-05 NOTE — Telephone Encounter (Signed)
Scheduled per 05/11 los, patient has been called and voicemail was left.

## 2020-05-06 ENCOUNTER — Inpatient Hospital Stay: Payer: Medicare PPO

## 2020-05-06 ENCOUNTER — Inpatient Hospital Stay: Payer: Medicare PPO | Admitting: Hematology and Oncology

## 2020-05-06 ENCOUNTER — Encounter: Payer: Self-pay | Admitting: *Deleted

## 2020-05-06 NOTE — Progress Notes (Signed)
RN placed call to Dr. Maryjean Ka office to review pain medication with his RN.  States pt is currently taking Belbuca 150 mcg BID.  RN informed Dr. Maryjean Ka office that pt will need to follow up with their office in the future for pain management.

## 2020-05-13 NOTE — Progress Notes (Signed)
Pharmacist Chemotherapy Monitoring - Follow Up Assessment    I verify that I have reviewed each item in the below checklist:  . Regimen for the patient is scheduled for the appropriate day and plan matches scheduled date. Marland Kitchen Appropriate non-routine labs are ordered dependent on drug ordered. . If applicable, additional medications reviewed and ordered per protocol based on lifetime cumulative doses and/or treatment regimen.   Plan for follow-up and/or issues identified: No . I-vent associated with next due treatment: No . MD and/or nursing notified: No  [name] K 05/13/2020 3:33 PM

## 2020-05-15 ENCOUNTER — Encounter: Payer: Self-pay | Admitting: Radiation Oncology

## 2020-05-15 ENCOUNTER — Ambulatory Visit
Admission: RE | Admit: 2020-05-15 | Discharge: 2020-05-15 | Disposition: A | Discharge status: Home / Self Care | Payer: Medicare PPO | Source: Ambulatory Visit | Attending: Radiation Oncology | Admitting: Radiation Oncology

## 2020-05-15 ENCOUNTER — Encounter: Payer: Self-pay | Admitting: *Deleted

## 2020-05-15 ENCOUNTER — Other Ambulatory Visit: Payer: Self-pay

## 2020-05-15 VITALS — BP 151/78 | HR 88 | Temp 97.9°F | Resp 20 | Ht 67.0 in | Wt 242.6 lb

## 2020-05-15 DIAGNOSIS — Z170 Estrogen receptor positive status [ER+]: Secondary | ICD-10-CM

## 2020-05-15 DIAGNOSIS — C50211 Malignant neoplasm of upper-inner quadrant of right female breast: Secondary | ICD-10-CM

## 2020-05-15 NOTE — Progress Notes (Signed)
Radiation Oncology         [phone] ________________________________  Name: [name] MRN: [id number]  Date: 05/15/2020  DOB: 09-11-1951  Re-Evaluation Note  CC: Maurice Small, MD  Maurice Small, MD    ICD-10-CM   1. Malignant neoplasm of upper-inner quadrant of right breast in female, estrogen receptor positive ([place])  C50.211    Z17.0     Diagnosis: Multifocal breast cancer   Stage IA (pT2, pN0, cM0) Right Breast UIQ, Invasive Ductal Carcinoma with high-grade DCIS, ER+ / PR+ / Her2+, Grade 3  Stage 0 Right Breast UOQ, DCIS, ER+ / PR+, Grade 3  Narrative: The patient returns today to discuss radiation treatment options. She was seen in the multidisciplinary breast clinic on 01/02/2020. At that time, it was recommended that the patient proceed with right lumpectomies with sentinel lymph node biopsy and port, adjuvant chemotherapy, adjuvant radiation therapy, and aromatase inhibitor.  She underwent right breast lumpectomy x2 with right sentinel lymph node biopsy on 01/22/2020 performed by Dr. Brantley Stage. Pathology from the procedure revealed grade 3 invasive ductal carcinoma with high-grade necrotic ductal carcinoma in-situ of the right medial breast. Resection margins were negative for carcinoma and there was no lymphovascular or perineural invasion present. It also revealed isolated foci of high-grade ductal carcinoma in-situ of the right lateral breast. Resection margins were negative for DCIS. Right additional anterior margin was negative for in-situ or invasive carcinoma. Four right axillary sentinel lymph nodes were biopsied and were all negative for carcinoma.  Echocardiogram on 02/04/2020 showed an EF of 60-65%.  The patient began adjuvant chemotherapy with Taxol and Herceptin on 02/19/2020 under the care of Dr. Lindi Adie. She did experience some moderate to severe fatigue, left lower extremity edema/cellulitis, major depression managed with Prozac, and diarrhea managed with  Imodium. Of note, she was tried on Keflex and then Bactrim to treat the cellulitis. She also had a CT scan of her abdomen and pelvis done for further evaluation. Results showed a 2.0 cm indeterminate left adrenal mass and a sub-cm indeterminate liver lesion. It also showed mild hepatic steatosis, cholelithiasis, colonic diverticulosis, and a 4.3 cm uterine fibroid. With no improvement, she was admitted to the hospital on 04/17/2020 for further treatment with IV antibiotics. She underwent a CT scan of her left lower extremity on 04/18/2020 that showed skin thickening and subcutaneous edema in the lateral aspect of the mid and distal left thigh without a definable abscess, consistent with cellulitis. She was discharged on 04/24/2020. Her chemotherapy treatment was discontinued after eight weeks secondary to the lower extremity edema.  She was last seen by Dr. Lindi Adie on 04/29/2020 and is to continue on Herceptin maintenance.  On review of systems, the patient reports left lower leg pain. She denies lymphedema and any other symptoms.    Allergies:  is allergic to [employer] tartrate].  Meds: Current Outpatient Medications  Medication Sig Dispense Refill  . amitriptyline (ELAVIL) 25 MG tablet Take 75 mg by mouth at bedtime.     Marland Kitchen amLODipine (NORVASC) 5 MG tablet Take 7.5 mg by mouth at bedtime.     . Buprenorphine HCl (BELBUCA) 150 MCG FILM Place 150 mcg inside cheek in the morning and at bedtime. Prescribed by Dr. Maryjean Ka    . diazepam (VALIUM) 10 MG tablet Take 1 tablet (10 mg total) by mouth at bedtime as needed for anxiety. (Patient taking differently: Take 10 mg by mouth at bedtime as needed for sleep. ) 30 tablet 2  . FLUoxetine (PROZAC) 40  MG capsule Take 80 mg by mouth at bedtime.     . furosemide (LASIX) 20 MG tablet Take 1 tablet (20 mg total) by mouth daily as needed for fluid or edema. Please take daily for 3 days, then as needed for leg swelling 30 tablet 0  . gabapentin (NEURONTIN)  100 MG capsule Take 1 capsule (100 mg total) by mouth 3 (three) times daily. 90 capsule 1  . HYDROmorphone (DILAUDID) 2 MG tablet Take 1 tablet (2 mg total) by mouth every 6 (six) hours as needed for severe pain. 60 tablet 0  . ibuprofen (ADVIL,MOTRIN) 200 MG tablet Take 400-600 mg by mouth 2 (two) times daily as needed for moderate pain.     Marland Kitchen levothyroxine (SYNTHROID, LEVOTHROID) 137 MCG tablet Take 137 mcg by mouth at bedtime.     . lidocaine-prilocaine (EMLA) cream Apply to affected area once (Patient taking differently: Apply 1 application topically daily as needed (prior to port being accessed.). Apply to affected area once) 30 g 3  . ondansetron (ZOFRAN) 8 MG tablet Take 1 tablet (8 mg total) by mouth 2 (two) times daily as needed (Nausea or vomiting). 30 tablet 1  . potassium chloride SA (KLOR-CON) 20 MEQ tablet Take 1 tablet (20 mEq total) by mouth daily. 30 tablet 0  . prochlorperazine (COMPAZINE) 10 MG tablet Take 1 tablet (10 mg total) by mouth every 6 (six) hours as needed (Nausea or vomiting). 30 tablet 1   No current facility-administered medications for this encounter.    Physical Findings: The patient is in no acute distress. Patient is alert and oriented.  height is '5\' 7"'  (1.702 m) and weight is 242 lb 9.6 oz (110 kg). Her tympanic temperature is 97.9 F (36.6 C). Her blood pressure is 151/78 (abnormal) and her pulse is 88. Her respiration is 20 and oxygen saturation is 93%.  . Lungs are clear to auscultation bilaterally. Heart has regular rate and rhythm. No palpable cervical, supraclavicular, or axillary adenopathy. Abdomen soft, non-tender, normal bowel sounds. Left breast: no palpable mass, nipple discharge or bleeding. Right breast: Patient has 2 lumpectomy scars within the right breast, one in the periareolar location and the second in the lateral aspect of the breast.  No signs of infection within the breast.  No nipple discharge or bleeding.  No dominant mass appreciated in  the breast.  Patient has a separate scar in the axillary region from her sentinel node procedure which is healed well.  Lab Findings: Lab Results  Component Value Date   WBC 6.5 04/29/2020   HGB 10.2 (L) 04/29/2020   HCT 32.3 (L) 04/29/2020   MCV 101.3 (H) 04/29/2020   PLT 241 04/29/2020    Radiographic Findings: DG Tibia/Fibula Left  Result Date: 04/17/2020 CLINICAL DATA:  Cellulitis EXAM: LEFT TIBIA AND FIBULA - 2 VIEW COMPARISON:  None. FINDINGS: No fracture or malalignment. No periostitis or bone destruction. Diffuse soft tissue edema without emphysema. IMPRESSION: Soft tissue edema.  No acute osseous abnormality Electronically Signed   By: Donavan Foil M.D.   On: 04/17/2020 20:33   CT Abdomen Pelvis W Contrast  Result Date: 04/16/2020 CLINICAL DATA:  Breast carcinoma. Currently undergoing chemotherapy. Unilateral left leg swelling for 8 weeks. Diarrhea. EXAM: CT ABDOMEN AND PELVIS WITH CONTRAST TECHNIQUE: Multidetector CT imaging of the abdomen and pelvis was performed using the standard protocol following bolus administration of intravenous contrast. CONTRAST:  170m OMNIPAQUE IOHEXOL 300 MG/ML  SOLN COMPARISON:  None. FINDINGS: Lower Chest: No acute findings. Hepatobiliary:  Mild diffuse hepatic steatosis. Subcentimeter low-attenuation lesion in the anterior right lobe has nonspecific characteristics and is difficult to characterize due to its small size. No other liver lesions identified. At least 1 gallstone is seen measuring 2 cm, however there is no evidence of cholecystitis or biliary ductal dilatation. Pancreas:  No mass or inflammatory changes. Spleen: Within normal limits in size and appearance. Adrenals/Urinary Tract: 2 cm homogeneous left adrenal mass is seen which has nonspecific characteristics. No renal masses identified. No evidence of ureteral calculi or hydronephrosis. Unremarkable unopacified urinary bladder. Stomach/Bowel: No evidence of obstruction, inflammatory process  or abnormal fluid collections. Normal appendix visualized. Diverticulosis is seen mainly involving the sigmoid colon, however there is no evidence of diverticulitis. Vascular/Lymphatic: No pathologically enlarged lymph nodes. No abdominal aortic aneurysm. No evidence of IVC iliac vein thrombosis. Reproductive: Fibroid seen in the right uterine fundus measuring 4.3 cm. Adnexal regions are unremarkable. Other:  None. Musculoskeletal: No suspicious bone lesions identified. Lumbar spine fusion hardware noted at L4-5. IMPRESSION: 1. 2 cm indeterminate left adrenal mass, and sub-cm indeterminate liver lesion. Abdomen MRI without and with contrast is recommended for further characterization. 2. Mild hepatic steatosis. 3. Cholelithiasis. No radiographic evidence of cholecystitis. 4. Colonic diverticulosis, without radiographic evidence of diverticulitis. 5. 4.3 cm uterine fibroid. Electronically Signed   By: Marlaine Hind M.D.   On: 04/16/2020 15:04   CT TIBIA FIBULA LEFT W CONTRAST  Result Date: 04/18/2020 CLINICAL DATA:  Cellulitis of the left lower leg. EXAM: CT OF THE LOWER RIGHT EXTREMITY WITH CONTRAST TECHNIQUE: Multidetector CT imaging of the lower right extremity was performed according to the standard protocol following intravenous contrast administration. COMPARISON:  None. CONTRAST:  133m OMNIPAQUE IOHEXOL 300 MG/ML  SOLN FINDINGS: Bones/Joint/Cartilage The bones of the knee and the tibia and fibula and bones of the ankle joint appear normal. Minimal right knee effusion. No ankle effusion. Muscles and Tendons Normal. Soft tissues There circumferential subcutaneous edema throughout the lower leg, nonspecific. This could represent cellulitis. No definable abscesses. IMPRESSION: 1. Circumferential subcutaneous edema throughout the lower leg, nonspecific. This could represent cellulitis. 2. No definable abscesses. 3. Minimal right knee effusion. Electronically Signed   By: JLorriane ShireM.D.   On: 04/18/2020  12:50   CT FEMUR LEFT W CONTRAST  Result Date: 04/18/2020 CLINICAL DATA:  Cellulitis of the left lower extremity. Left leg swelling. Left leg pain. EXAM: CT OF THE LOWER RIGHT EXTREMITY WITH CONTRAST TECHNIQUE: Multidetector CT imaging of the lower right extremity was performed according to the standard protocol following intravenous contrast administration. COMPARISON:  Radiographs dated 04/17/2020 CONTRAST:  1068mOMNIPAQUE IOHEXOL 300 MG/ML  SOLN FINDINGS: Bones/Joint/Cartilage The bones of the left hip appear normal. Left femur is normal. No hip effusion. Minimal left knee effusion. Muscles and Tendons Muscles and tendons of the left hip and left thigh are normal. Soft tissues There is skin thickening and subcutaneous edema in the lateral aspect of mid and distal left thigh without a definable abscess. IMPRESSION: 1. Skin thickening and subcutaneous edema in the lateral aspect of the mid and distal left thigh without a definable abscess. The finding is consistent with cellulitis. 2. Minimal left knee effusion. Electronically Signed   By: JaLorriane Shire.D.   On: 04/18/2020 12:47   CT FOOT LEFT W CONTRAST  Result Date: 04/18/2020 CLINICAL DATA:  Cellulitis of the left lower extremity. EXAM: CT OF THE LEFT FOOT WITH CONTRAST TECHNIQUE: Multidetector CT imaging of the left foot was performed according to the standard  protocol following intravenous contrast administration. COMPARISON:  Radiographs dated 04/17/2020 CONTRAST:  164m OMNIPAQUE IOHEXOL 300 MG/ML  SOLN FINDINGS: Bones/Joint/Cartilage Normal. No joint effusions. No osteomyelitis. Muscles and Tendons Normal. Soft tissues There is subcutaneous edema at the medial and lateral aspects of the foot and extending onto the dorsum of the foot. No abscesses. IMPRESSION: 1. Subcutaneous edema of the foot and ankle consistent with cellulitis. 2. No abscess or osteomyelitis. Electronically Signed   By: JLorriane ShireM.D.   On: 04/18/2020 12:53   DG Foot  Complete Left  Result Date: 04/17/2020 CLINICAL DATA:  Cellulitis EXAM: LEFT FOOT - COMPLETE 3+ VIEW COMPARISON:  None. FINDINGS: No fracture or malalignment. Diffuse soft tissue edema. No erosion or bony destruction. IMPRESSION: No acute osseous abnormality Electronically Signed   By: KDonavan FoilM.D.   On: 04/17/2020 20:34   DG FEMUR 1V LEFT  Result Date: 04/17/2020 CLINICAL DATA:  Cellulitis EXAM: LEFT FEMUR 1 VIEW COMPARISON:  None. FINDINGS: No fracture or malalignment. Soft tissue edema without soft tissue emphysema. IMPRESSION: No acute osseous abnormality Electronically Signed   By: KDonavan FoilM.D.   On: 04/17/2020 20:35    Impression: Multifocal breast cancer   Stage IA (pT2, pN0, cM0) Right Breast UIQ, Invasive Ductal Carcinoma with high-grade DCIS, ER+ / PR+ / Her2+, Grade 3  Stage 0 Right Breast UOQ, DCIS, ER+ / PR+, Grade 3  Patient is now ready to proceed with radiation therapy as breast conserving treatment.  As above her chemotherapy was abbreviated given her significant side effects and other complicating issues.  She will continue on Herceptin therapy for a total of 1 year.  I discussed the overall treatment course side effects and potential toxicities of radiation therapy in the situation with the patient.  She appears to understand and wishes to proceed with planned course of treatment.  Plan:  Patient is scheduled for CT simulation next week with treatments to begin approximately 2 weeks from now.  Patient may be a candidate for hypofractionated accelerated radiation therapy but will make this determination at the time of simulation.  -----------------------------------  JBlair Promise PhD, MD  This document serves as a record of services personally performed by JGery Pray MD. It was created on his behalf by MClerance Lav a trained medical scribe. The creation of this record is based on the scribe's personal observations and the provider's statements to them.  This document has been checked and approved by the attending provider.

## 2020-05-15 NOTE — Progress Notes (Signed)
  12/26/2019 Initial Diagnosis   Patient palpated a right breast lump x1wk. Mammogram and US showed two adjacent masses at the 2 o'clock position measuring 1.4cm and 0.6cm, calcifications in the outer right breast at the 9 o'clock position, no right axillary adenopathy. Biopsy showed IDC at the 2 o'clock position, grade 3, HER-2 + (3+), ER+ 90%, PR+ 30%, Ki67 30%, and DCIS in the upper outer right breast, high grade, ER+ 95%, PR 90%.    01/22/2020: Right medial lumpectomy: Grade 3 IDC 2.3 cm with high-grade DCIS with necrosis, margins negative, negative for lymphovascular or perineural invasion, Right lateral lumpectomy: Isolated foci of DCIS high-grade, resection margins negative 4 lymph nodes negative, ER 90%, PR 30%, HER-2 3+ positive, Ki-67 30%  Past/Anticipated interventions by surgeon, if any:10/2019   Past/Anticipated interventions by medical oncology, if VIF:BPPH dose 05/07/2020   Chemotherapy: finished  Lymphedema issues, if any: none   Pain issues, if KFE:XMDY lower leg    SAFETY ISSUES:  Prior radiation? No     Pacemaker/ICD? no   Possible current pregnancy?no   Is the patient on methotrexate? no  Current Complaints / other details:  Patient in for consult and CT sim today consent obtained per Dr Sondra Come questions and concerns addressed.  BP (!) 151/78 (Patient Position: Sitting)   Pulse 88   Temp 97.9 F (36.6 C) (Tympanic)   Resp 20   Ht '5\' 7"'$  (1.702 m)   Wt 242 lb 9.6 oz (110 kg)   LMP  (LMP Unknown)   SpO2 93%   BMI 38.00 kg/m    Filed Weights   05/15/20 1426  Weight: 242 lb 9.6 oz (110 kg)       De Burrs, RN 05/15/2020,9:25 AM

## 2020-05-15 NOTE — Patient Instructions (Signed)
Coronavirus (COVID-19) Are you at risk?  Are you at risk for the Coronavirus (COVID-19)?  To be considered HIGH RISK for Coronavirus (COVID-19), you have to meet the following criteria:  . Traveled to [place], [place], [place], [place] or [place]; or in the [place] to [place], [place], [place], or [place]; and have fever, cough, and shortness of breath within the last 2 weeks of travel OR . Been in close contact with a person diagnosed with COVID-19 within the last 2 weeks and have fever, cough, and shortness of breath . IF YOU DO NOT MEET THESE CRITERIA, YOU ARE CONSIDERED LOW RISK FOR COVID-19.  What to do if you are HIGH RISK for COVID-19?  . If you are having a medical emergency, call 911. . Seek medical care right away. Before you go to a doctor's office, urgent care or emergency department, call ahead and tell them about your recent travel, contact with someone diagnosed with COVID-19, and your symptoms. You should receive instructions from your physician's office regarding next steps of care.  . When you arrive at healthcare provider, tell the healthcare staff immediately you have returned from visiting [place], [place], [place], [place] or [place]; or traveled in the [place] to [place], [place], [place], or [place]; in the last two weeks or you have been in close contact with a person diagnosed with COVID-19 in the last 2 weeks.   . Tell the health care staff about your symptoms: fever, cough and shortness of breath. . After you have been seen by a medical provider, you will be either: o Tested for (COVID-19) and discharged home on quarantine except to seek medical care if symptoms worsen, and asked to  - Stay home and avoid contact with others until you get your results (4-5 days)  - Avoid travel on public transportation if possible (such as bus, train, or airplane) or o Sent to the Emergency Department by EMS for evaluation, COVID-19 testing, and possible  admission depending on your condition and test results.  What to do if you are LOW RISK for COVID-19?  Reduce your risk of any infection by using the same precautions used for avoiding the common cold or flu:  . Wash your hands often with soap and warm water for at least 20 seconds.  If soap and water are not readily available, use an alcohol-based hand sanitizer with at least 60% alcohol.  . If coughing or sneezing, cover your mouth and nose by coughing or sneezing into the elbow areas of your shirt or coat, into a tissue or into your sleeve (not your hands). . Avoid shaking hands with others and consider head nods or verbal greetings only. . Avoid touching your eyes, nose, or mouth with unwashed hands.  . Avoid close contact with people who are sick. . Avoid places or events with large numbers of people in one location, like concerts or sporting events. . Carefully consider travel plans you have or are making. . If you are planning any travel outside or inside the [place], visit the CDC's Travelers' Health webpage for the latest health notices. . If you have some symptoms but not all symptoms, continue to monitor at home and seek medical attention if your symptoms worsen. . If you are having a medical emergency, call 911.   ADDITIONAL HEALTHCARE OPTIONS FOR PATIENTS  [facility] / e-Visit: [email or web address]         [facility]: [phone]  [facility]: [phone]                   [facility]: [phone]   

## 2020-05-20 ENCOUNTER — Inpatient Hospital Stay: Payer: Medicare PPO

## 2020-05-20 ENCOUNTER — Encounter: Payer: Self-pay | Admitting: *Deleted

## 2020-05-20 ENCOUNTER — Inpatient Hospital Stay: Payer: Medicare PPO | Attending: Hematology and Oncology

## 2020-05-20 ENCOUNTER — Other Ambulatory Visit: Payer: Self-pay | Admitting: *Deleted

## 2020-05-20 DIAGNOSIS — Z170 Estrogen receptor positive status [ER+]: Secondary | ICD-10-CM

## 2020-05-20 DIAGNOSIS — C50211 Malignant neoplasm of upper-inner quadrant of right female breast: Secondary | ICD-10-CM

## 2020-05-20 DIAGNOSIS — Z5112 Encounter for antineoplastic immunotherapy: Secondary | ICD-10-CM | POA: Insufficient documentation

## 2020-05-21 ENCOUNTER — Ambulatory Visit
Admission: RE | Admit: 2020-05-21 | Discharge: 2020-05-21 | Disposition: A | Discharge status: Home / Self Care | Payer: Medicare PPO | Source: Ambulatory Visit | Attending: Radiation Oncology | Admitting: Radiation Oncology

## 2020-05-21 ENCOUNTER — Other Ambulatory Visit: Payer: Self-pay

## 2020-05-21 DIAGNOSIS — C50211 Malignant neoplasm of upper-inner quadrant of right female breast: Secondary | ICD-10-CM

## 2020-05-21 DIAGNOSIS — Z170 Estrogen receptor positive status [ER+]: Secondary | ICD-10-CM | POA: Diagnosis not present

## 2020-05-21 DIAGNOSIS — Z510 Encounter for antineoplastic radiation therapy: Secondary | ICD-10-CM | POA: Insufficient documentation

## 2020-05-21 NOTE — Progress Notes (Signed)
  Radiation Oncology         [phone] ________________________________  Name: [name] MRN: [id number]  Date: 05/21/2020  DOB: 1951-11-06  SIMULATION AND TREATMENT PLANNING NOTE    ICD-10-CM   1. Malignant neoplasm of upper-inner quadrant of right breast in female, estrogen receptor positive ([place])  C50.211    Z17.0     DIAGNOSIS: StageIA (pT2, pN0, cM0)RightBreast UIQ,Invasive DuctalCarcinoma with high-grade DCIS, ER+/ PR+/ Her2+, Grade3  Stage0Right Breast UOQ, DCIS, ER+ / PR+, Grade 3  NARRATIVE:  The patient was brought to the [facility].  Identity was confirmed.  All relevant records and images related to the planned course of therapy were reviewed.  The patient freely provided informed written consent to proceed with treatment after reviewing the details related to the planned course of therapy. The consent form was witnessed and verified by the simulation staff.  Then, the patient was set-up in a stable reproducible  supine position for radiation therapy.  CT images were obtained.  Surface markings were placed.  The CT images were loaded into the planning software.  Then the target and avoidance structures were contoured.  Treatment planning then occurred.  The radiation prescription was entered and confirmed.  Then, I designed and supervised the construction of a total of 5 medically necessary complex treatment devices.  I have requested : 3D Simulation  I have requested a DVH of the following structures: heart, lungs, lumpectomy cavity.  I have ordered:dose calc.  PLAN:  The patient will receive 40.05 Gy in 15 fractions followed by a boost to the lumpectomy cavity of 10 Gy in 5 fractions.   Optical Surface Tracking Plan:  Since intensity modulated radiotherapy (IMRT) and 3D conformal radiation treatment methods are predicated on accurate and precise positioning for treatment, intrafraction motion monitoring is medically necessary to ensure  accurate and safe treatment delivery.  The ability to quantify intrafraction motion without excessive ionizing radiation dose can only be performed with optical surface tracking. Accordingly, surface imaging offers the opportunity to obtain 3D measurements of patient position throughout IMRT and 3D treatments without excessive radiation exposure.  I am ordering optical surface tracking for this patient's upcoming course of radiotherapy. ________________________________    Blair Promise, PhD, MD  This document serves as a record of services personally performed by Gery Pray, MD. It was created on his behalf by Clerance Lav, a trained medical scribe. The creation of this record is based on the scribe's personal observations and the provider's statements to them. This document has been checked and approved by the attending provider.

## 2020-05-22 ENCOUNTER — Inpatient Hospital Stay (HOSPITAL_COMMUNITY): Admission: RE | Admit: 2020-05-22 | Payer: Medicare PPO | Source: Ambulatory Visit

## 2020-05-22 ENCOUNTER — Encounter: Payer: Self-pay | Admitting: *Deleted

## 2020-05-23 DIAGNOSIS — Z510 Encounter for antineoplastic radiation therapy: Secondary | ICD-10-CM | POA: Diagnosis not present

## 2020-05-28 ENCOUNTER — Ambulatory Visit
Admission: RE | Admit: 2020-05-28 | Discharge: 2020-05-28 | Disposition: A | Discharge status: Home / Self Care | Payer: Medicare PPO | Source: Ambulatory Visit | Attending: Radiation Oncology | Admitting: Radiation Oncology

## 2020-05-28 ENCOUNTER — Other Ambulatory Visit: Payer: Self-pay

## 2020-05-28 DIAGNOSIS — Z510 Encounter for antineoplastic radiation therapy: Secondary | ICD-10-CM | POA: Diagnosis not present

## 2020-05-29 ENCOUNTER — Ambulatory Visit
Admission: RE | Admit: 2020-05-29 | Discharge: 2020-05-29 | Disposition: A | Discharge status: Home / Self Care | Payer: Medicare PPO | Source: Ambulatory Visit | Attending: Radiation Oncology | Admitting: Radiation Oncology

## 2020-05-29 ENCOUNTER — Other Ambulatory Visit: Payer: Self-pay

## 2020-05-29 DIAGNOSIS — Z510 Encounter for antineoplastic radiation therapy: Secondary | ICD-10-CM | POA: Diagnosis not present

## 2020-05-30 ENCOUNTER — Ambulatory Visit
Admission: RE | Admit: 2020-05-30 | Discharge: 2020-05-30 | Disposition: A | Discharge status: Home / Self Care | Payer: Medicare PPO | Source: Ambulatory Visit | Attending: Radiation Oncology | Admitting: Radiation Oncology

## 2020-05-30 ENCOUNTER — Other Ambulatory Visit: Payer: Self-pay

## 2020-05-30 DIAGNOSIS — Z510 Encounter for antineoplastic radiation therapy: Secondary | ICD-10-CM | POA: Diagnosis not present

## 2020-06-02 ENCOUNTER — Ambulatory Visit
Admission: RE | Admit: 2020-06-02 | Discharge: 2020-06-02 | Disposition: A | Discharge status: Home / Self Care | Payer: Medicare PPO | Source: Ambulatory Visit | Attending: Radiation Oncology | Admitting: Radiation Oncology

## 2020-06-02 ENCOUNTER — Other Ambulatory Visit: Payer: Self-pay

## 2020-06-02 DIAGNOSIS — Z510 Encounter for antineoplastic radiation therapy: Secondary | ICD-10-CM | POA: Diagnosis not present

## 2020-06-03 ENCOUNTER — Other Ambulatory Visit: Payer: Self-pay

## 2020-06-03 ENCOUNTER — Ambulatory Visit
Admission: RE | Admit: 2020-06-03 | Discharge: 2020-06-03 | Disposition: A | Discharge status: Home / Self Care | Payer: Medicare PPO | Source: Ambulatory Visit | Attending: Radiation Oncology | Admitting: Radiation Oncology

## 2020-06-03 ENCOUNTER — Ambulatory Visit (HOSPITAL_COMMUNITY)
Admission: RE | Admit: 2020-06-03 | Discharge: 2020-06-03 | Disposition: A | Discharge status: Home / Self Care | Payer: Medicare PPO | Source: Ambulatory Visit | Attending: Hematology and Oncology | Admitting: Hematology and Oncology

## 2020-06-03 DIAGNOSIS — I071 Rheumatic tricuspid insufficiency: Secondary | ICD-10-CM | POA: Diagnosis not present

## 2020-06-03 DIAGNOSIS — Z01818 Encounter for other preprocedural examination: Secondary | ICD-10-CM | POA: Diagnosis present

## 2020-06-03 DIAGNOSIS — I119 Hypertensive heart disease without heart failure: Secondary | ICD-10-CM | POA: Diagnosis not present

## 2020-06-03 DIAGNOSIS — E039 Hypothyroidism, unspecified: Secondary | ICD-10-CM | POA: Diagnosis not present

## 2020-06-03 DIAGNOSIS — Z170 Estrogen receptor positive status [ER+]: Secondary | ICD-10-CM

## 2020-06-03 DIAGNOSIS — C50211 Malignant neoplasm of upper-inner quadrant of right female breast: Secondary | ICD-10-CM | POA: Diagnosis not present

## 2020-06-03 DIAGNOSIS — C50919 Malignant neoplasm of unspecified site of unspecified female breast: Secondary | ICD-10-CM | POA: Diagnosis not present

## 2020-06-03 DIAGNOSIS — Z510 Encounter for antineoplastic radiation therapy: Secondary | ICD-10-CM | POA: Diagnosis not present

## 2020-06-03 NOTE — Progress Notes (Signed)
  Echocardiogram 2D Echocardiogram has been performed.  Christean Silvestri G Ethyl Vila 06/03/2020, 11:36 AM

## 2020-06-04 ENCOUNTER — Ambulatory Visit
Admission: RE | Admit: 2020-06-04 | Discharge: 2020-06-04 | Disposition: A | Discharge status: Home / Self Care | Payer: Medicare PPO | Source: Ambulatory Visit | Attending: Radiation Oncology | Admitting: Radiation Oncology

## 2020-06-04 ENCOUNTER — Other Ambulatory Visit: Payer: Self-pay

## 2020-06-04 DIAGNOSIS — Z510 Encounter for antineoplastic radiation therapy: Secondary | ICD-10-CM | POA: Diagnosis not present

## 2020-06-04 NOTE — Progress Notes (Signed)
Pharmacist Chemotherapy Monitoring - Follow Up Assessment    I verify that I have reviewed each item in the below checklist:  . Regimen for the patient is scheduled for the appropriate day and plan matches scheduled date. Marland Kitchen Appropriate non-routine labs are ordered dependent on drug ordered. . If applicable, additional medications reviewed and ordered per protocol based on lifetime cumulative doses and/or treatment regimen.   Plan for follow-up and/or issues identified: Yes . I-vent associated with next due treatment: Yes . MD and/or nursing notified: Yes   Kennith Center, Pharm.D., CPP 06/04/2020@2 :15 PM

## 2020-06-05 ENCOUNTER — Ambulatory Visit
Admission: RE | Admit: 2020-06-05 | Discharge: 2020-06-05 | Disposition: A | Discharge status: Home / Self Care | Payer: Medicare PPO | Source: Ambulatory Visit | Attending: Radiation Oncology | Admitting: Radiation Oncology

## 2020-06-05 ENCOUNTER — Other Ambulatory Visit: Payer: Self-pay

## 2020-06-05 DIAGNOSIS — Z510 Encounter for antineoplastic radiation therapy: Secondary | ICD-10-CM | POA: Diagnosis not present

## 2020-06-06 ENCOUNTER — Other Ambulatory Visit: Payer: Self-pay

## 2020-06-06 ENCOUNTER — Ambulatory Visit
Admission: RE | Admit: 2020-06-06 | Discharge: 2020-06-06 | Disposition: A | Discharge status: Home / Self Care | Payer: Medicare PPO | Source: Ambulatory Visit | Attending: Radiation Oncology | Admitting: Radiation Oncology

## 2020-06-06 DIAGNOSIS — Z510 Encounter for antineoplastic radiation therapy: Secondary | ICD-10-CM | POA: Diagnosis not present

## 2020-06-08 DIAGNOSIS — Z510 Encounter for antineoplastic radiation therapy: Secondary | ICD-10-CM | POA: Diagnosis not present

## 2020-06-09 ENCOUNTER — Ambulatory Visit
Admission: RE | Admit: 2020-06-09 | Discharge: 2020-06-09 | Disposition: A | Discharge status: Home / Self Care | Payer: Medicare PPO | Source: Ambulatory Visit | Attending: Radiation Oncology | Admitting: Radiation Oncology

## 2020-06-09 ENCOUNTER — Other Ambulatory Visit: Payer: Self-pay

## 2020-06-09 DIAGNOSIS — Z510 Encounter for antineoplastic radiation therapy: Secondary | ICD-10-CM | POA: Diagnosis not present

## 2020-06-09 NOTE — Progress Notes (Signed)
Patient Care Team: Maurice Small, MD as PCP - General (Family Medicine) Rockwell Germany, RN as Oncology Nurse Navigator Tressie Ellis, Paulette Blanch, RN as Oncology Nurse Navigator Erroll Luna, MD as Consulting Physician (General Surgery) Nicholas Lose, MD as Consulting Physician (Hematology and Oncology) Gery Pray, MD as Consulting Physician (Radiation Oncology)  DIAGNOSIS:    ICD-10-CM   1. Malignant neoplasm of upper-inner quadrant of right breast in female, estrogen receptor positive ([place])  C50.211    Z17.0     SUMMARY OF ONCOLOGIC HISTORY: Oncology History  Malignant neoplasm of upper-inner quadrant of right breast in female, estrogen receptor positive ([place])  12/26/2019 Initial Diagnosis   Patient palpated a right breast lump x1wk. Mammogram and US showed two adjacent masses at the 2 o'clock position measuring 1.4cm and 0.6cm, calcifications in the outer right breast at the 9 o'clock position, no right axillary adenopathy. Biopsy showed IDC at the 2 o'clock position, grade 3, HER-2 + (3+), ER+ 90%, PR+ 30%, Ki67 30%, and DCIS in the upper outer right breast, high grade, ER+ 95%, PR 90%.   01/22/2020 Surgery   Right breast lumpectomy x2 ([facility]): Medial position: IDC, grade 3, 2.3cm, with high grade DCIS, clear margins Lateral position: high grade DCIS, clear margins, 4 right axillary lymph nodes negative    01/22/2020 Cancer Staging   Staging form: Breast, AJCC 8th Edition - Pathologic stage from 01/22/2020: Stage IA (pT2, pN0, cM0, G3, ER+, PR+, HER2+) - Signed by Gardenia Phlegm, NP on 02/06/2020   02/19/2020 -  Chemotherapy   The patient had PACLitaxel (TAXOL) 180 mg in sodium chloride 0.9 % 250 mL chemo infusion (</= 26m/m2), 80 mg/m2 = 180 mg, Intravenous,  Once, 3 of 3 cycles Dose modification: 65 mg/m2 (original dose 80 mg/m2, Cycle 2, Reason: Dose not tolerated), 65 mg/m2 (original dose 80 mg/m2, Cycle 2, Reason: Dose not tolerated) Administration: 180 mg (02/19/2020), 180 mg  (02/26/2020), 180 mg (03/18/2020), 180 mg (03/04/2020), 180 mg (03/11/2020), 144 mg (03/25/2020), 144 mg (04/01/2020), 144 mg (04/08/2020), 144 mg (04/15/2020) trastuzumab-anns (KANJINTI) 441 mg in sodium chloride 0.9 % 250 mL chemo infusion, 4 mg/kg = 441 mg (100 % of original dose 4 mg/kg), Intravenous,  Once, 5 of 16 cycles Dose modification: 4 mg/kg (original dose 4 mg/kg, Cycle 1, Reason: Other (see comments)), 6 mg/kg (original dose 6 mg/kg, Cycle 4, Reason: Other (see comments), Comment: insurance preferred biosimilar), 6 mg/kg (original dose 6 mg/kg, Cycle 10, Reason: Other (see comments)) Administration: 441 mg (02/19/2020), 210 mg (02/26/2020), 210 mg (03/04/2020), 210 mg (03/11/2020), 210 mg (03/18/2020), 210 mg (03/25/2020), 210 mg (04/01/2020), 210 mg (04/08/2020), 210 mg (04/15/2020), 651 mg (04/29/2020)  for chemotherapy treatment.    05/29/2020 -  Radiation Therapy   Adjuvant radiation     CHIEF COMPLIANT: Herceptin maintenance   INTERVAL HISTORY: Isabel STANKOVICHis a 69y.o. with above-mentioned history of right breast cancerwhounderwent a right breast lumpectomy x2, adjuvant chemotherapy, and is currently on Herceptin maintenance and undergoing radiation.She presents to the clinic todayfortreatment.  Her major complaint is related to lower extremity swelling which has been chronic in nature.  Causes some tightness and pain and discomfort.  We have put her through extensive work-ups with ultrasounds which have been negative for DVTs cardiac echocardiograms done in June were normal.  She had also seen her primary care physician who also could not figure out the cause of her leg swelling.  Previously she was in the hospital and was treated with IV antibiotics  without any improvement.  She is otherwise tolerating Herceptin extremely well.  She is currently on radiation and feels tired from that.  ALLERGIES:  is allergic to [employer] tartrate].  MEDICATIONS:  Current Outpatient Medications    Medication Sig Dispense Refill  . amitriptyline (ELAVIL) 25 MG tablet Take 75 mg by mouth at bedtime.     Marland Kitchen amLODipine (NORVASC) 5 MG tablet Take 7.5 mg by mouth at bedtime.     . Buprenorphine HCl (BELBUCA) 150 MCG FILM Place 150 mcg inside cheek in the morning and at bedtime. Prescribed by Dr. Maryjean [name]    . diazepam (VALIUM) 10 MG tablet Take 1 tablet (10 mg total) by mouth at bedtime as needed for anxiety. (Patient taking differently: Take 10 mg by mouth at bedtime as needed for sleep. ) 30 tablet 2  . FLUoxetine (PROZAC) 40 MG capsule Take 80 mg by mouth at bedtime.     . furosemide (LASIX) 20 MG tablet Take 1 tablet (20 mg total) by mouth daily as needed for fluid or edema. Please take daily for 3 days, then as needed for leg swelling 30 tablet 0  . gabapentin (NEURONTIN) 100 MG capsule Take 1 capsule (100 mg total) by mouth 3 (three) times daily. 90 capsule 1  . HYDROmorphone (DILAUDID) 2 MG tablet Take 1 tablet (2 mg total) by mouth every 6 (six) hours as needed for severe pain. 60 tablet 0  . ibuprofen (ADVIL,MOTRIN) 200 MG tablet Take 400-600 mg by mouth 2 (two) times daily as needed for moderate pain.     Marland Kitchen levothyroxine (SYNTHROID, LEVOTHROID) 137 MCG tablet Take 137 mcg by mouth at bedtime.     . lidocaine-prilocaine (EMLA) cream Apply to affected area once (Patient taking differently: Apply 1 application topically daily as needed (prior to port being accessed.). Apply to affected area once) 30 g 3  . ondansetron (ZOFRAN) 8 MG tablet Take 1 tablet (8 mg total) by mouth 2 (two) times daily as needed (Nausea or vomiting). 30 tablet 1  . potassium chloride SA (KLOR-CON) 20 MEQ tablet Take 1 tablet (20 mEq total) by mouth daily. 30 tablet 0  . prochlorperazine (COMPAZINE) 10 MG tablet Take 1 tablet (10 mg total) by mouth every 6 (six) hours as needed (Nausea or vomiting). 30 tablet 1   No current facility-administered medications for this visit.    PHYSICAL EXAMINATION: ECOG PERFORMANCE  STATUS: 2 - Symptomatic, <50% confined to bed  Vitals:   06/10/20 1149  BP: (!) 150/67  Pulse: 80  Resp: 18  Temp: 98.5 F (36.9 C)  SpO2: 100%   Filed Weights   06/10/20 1149  Weight: 244 lb 3.2 oz (110.8 kg)    LABORATORY DATA:  I have reviewed the data as listed CMP Latest Ref Rng & Units 04/29/2020 04/24/2020 04/23/2020  Glucose 70 - 99 mg/dL 105(H) 133(H) 116(H)  BUN 8 - 23 mg/dL '11 16 14  ' Creatinine 0.44 - 1.00 mg/dL 0.75 0.72 0.69  Sodium 135 - 145 mmol/L 143 140 141  Potassium 3.5 - 5.1 mmol/L 3.1(L) 3.7 3.7  Chloride 98 - 111 mmol/L 102 103 104  CO2 22 - 32 mmol/L '29 28 27  ' Calcium 8.9 - 10.3 mg/dL 8.5(L) 8.5(L) 8.5(L)  Total Protein 6.5 - 8.1 g/dL 6.3(L) - -  Total Bilirubin 0.3 - 1.2 mg/dL 0.5 - -  Alkaline Phos 38 - 126 U/L 102 - -  AST 15 - 41 U/L 23 - -  ALT 0 - 44 U/L  24 - -    Lab Results  Component Value Date   WBC 7.5 06/10/2020   HGB 11.3 (L) 06/10/2020   HCT 35.6 (L) 06/10/2020   MCV 97.5 06/10/2020   PLT 245 06/10/2020   NEUTROABS 5.6 06/10/2020    ASSESSMENT & PLAN:  Malignant neoplasm of upper-inner quadrant of right breast in female, estrogen receptor positive ([place]) 01/22/2020: Right medial lumpectomy: Grade 3 IDC 2.3 cm with high-grade DCIS with necrosis, margins negative, negative for lymphovascular or perineural invasion, Right lateral lumpectomy: Isolated foci of DCIS high-grade, resection margins negative 4 lymph nodes negative, ER 90%, PR 30%, HER-2 3+ positive, Ki-67 30%  Treatment plan: 1.Adjuvant chemotherapy with Taxol Herceptin weekly x 9 (stopped early because of lower extremity edema) followed by Herceptin maintenance 2.Adjuvant radiation therapy started 05/29/2020 3.Followed by adjuvant antiestrogen therapy. ----------------------------------------------------------------------------------------------------------------------------------------------------------- Current treatment: Herceptin maintenance and adjuvant  radiation Echocardiogram 02/04/2020: EF 60 to 65%  Chemo toxicities: 1.Moderate to severe fatigue: Due to Taxol chemo  2.Lower extremity edema leftleg.  Required hospitalization for cellulitis treated with IV antibiotics. This continues to be swollen but under stable condition. 3.Major depression: Currently on Prozac. 4.Diarrhea: Due to Herceptin. I encouraged her to take 2 Imodium's every morning to stop the diarrhea. 5.  Pain issues: I prescribed tramadol today.   Leg pain: Discontinued Dilaudid and prescribed tramadol. Hypokalemia: On oral potassium daily.  Return to clinicevery 3 weeks for Herceptin maintenance and every 6 weeks to follow-up with me..     No orders of the defined types were placed in this encounter.  The patient has a good understanding of the overall plan. she agrees with it. she will call with any problems that may develop before the next visit here.  Total time spent: 30 mins including face to face time and time spent for planning, charting and coordination of care  Nicholas Lose, MD 06/10/2020  I, [name], am acting as scribe for Dr. Nicholas Lose.  I have reviewed the above documentation for accuracy and completeness, and I agree with the above.

## 2020-06-10 ENCOUNTER — Inpatient Hospital Stay (HOSPITAL_BASED_OUTPATIENT_CLINIC_OR_DEPARTMENT_OTHER): Payer: Medicare PPO | Admitting: Hematology and Oncology

## 2020-06-10 ENCOUNTER — Ambulatory Visit
Admission: RE | Admit: 2020-06-10 | Discharge: 2020-06-10 | Disposition: A | Discharge status: Home / Self Care | Payer: Medicare PPO | Source: Ambulatory Visit | Attending: Radiation Oncology | Admitting: Radiation Oncology

## 2020-06-10 ENCOUNTER — Other Ambulatory Visit: Payer: Self-pay

## 2020-06-10 ENCOUNTER — Telehealth: Payer: Self-pay | Admitting: Hematology and Oncology

## 2020-06-10 ENCOUNTER — Encounter: Payer: Self-pay | Admitting: *Deleted

## 2020-06-10 ENCOUNTER — Inpatient Hospital Stay: Payer: Medicare PPO

## 2020-06-10 ENCOUNTER — Telehealth: Payer: Self-pay | Admitting: Oncology

## 2020-06-10 ENCOUNTER — Other Ambulatory Visit: Payer: Self-pay | Admitting: Hematology and Oncology

## 2020-06-10 DIAGNOSIS — Z170 Estrogen receptor positive status [ER+]: Secondary | ICD-10-CM

## 2020-06-10 DIAGNOSIS — Z510 Encounter for antineoplastic radiation therapy: Secondary | ICD-10-CM | POA: Diagnosis not present

## 2020-06-10 DIAGNOSIS — Z5112 Encounter for antineoplastic immunotherapy: Secondary | ICD-10-CM | POA: Diagnosis present

## 2020-06-10 DIAGNOSIS — C50211 Malignant neoplasm of upper-inner quadrant of right female breast: Secondary | ICD-10-CM

## 2020-06-10 LAB — CMP (CANCER CENTER ONLY)
ALT: 19 U/L (ref 0–44)
AST: 17 U/L (ref 15–41)
Albumin: 3.4 g/dL — ABNORMAL LOW (ref 3.5–5.0)
Alkaline Phosphatase: 103 U/L (ref 38–126)
Anion gap: 10 (ref 5–15)
BUN: 16 mg/dL (ref 8–23)
CO2: 29 mmol/L (ref 22–32)
Calcium: 8.6 mg/dL — ABNORMAL LOW (ref 8.9–10.3)
Chloride: 104 mmol/L (ref 98–111)
Creatinine: 0.75 mg/dL (ref 0.44–1.00)
GFR, Est AFR Am: >60 mL/min (ref >60)
GFR, Estimated: >60 mL/min (ref >60)
Glucose, Bld: 102 mg/dL — ABNORMAL HIGH (ref 70–99)
Potassium: 4.1 mmol/L (ref 3.5–5.1)
Sodium: 143 mmol/L (ref 135–145)
Total Bilirubin: 0.4 mg/dL (ref 0.3–1.2)
Total Protein: 6.6 g/dL (ref 6.5–8.1)

## 2020-06-10 LAB — CBC WITH DIFFERENTIAL (CANCER CENTER ONLY)
Abs Immature Granulocytes: 0.02 10*3/uL (ref 0.00–0.07)
Basophils Absolute: 0 10*3/uL (ref 0.0–0.1)
Basophils Relative: 0 %
Eosinophils Absolute: 0.2 10*3/uL (ref 0.0–0.5)
Eosinophils Relative: 3 %
HCT: 35.6 % — ABNORMAL LOW (ref 36.0–46.0)
Hemoglobin: 11.3 g/dL — ABNORMAL LOW (ref 12.0–15.0)
Immature Granulocytes: 0 %
Lymphocytes Relative: 14 %
Lymphs Abs: 1.1 10*3/uL (ref 0.7–4.0)
MCH: 31 pg (ref 26.0–34.0)
MCHC: 31.7 g/dL (ref 30.0–36.0)
MCV: 97.5 fL (ref 80.0–100.0)
Monocytes Absolute: 0.6 10*3/uL (ref 0.1–1.0)
Monocytes Relative: 8 %
Neutro Abs: 5.6 10*3/uL (ref 1.7–7.7)
Neutrophils Relative %: 75 %
Platelet Count: 245 10*3/uL (ref 150–400)
RBC: 3.65 MIL/uL — ABNORMAL LOW (ref 3.87–5.11)
RDW: 16 % — ABNORMAL HIGH (ref 11.5–15.5)
WBC Count: 7.5 10*3/uL (ref 4.0–10.5)
nRBC: 0 % (ref 0.0–0.2)

## 2020-06-10 MED ORDER — ACETAMINOPHEN 325 MG PO TABS
650.0000 mg | ORAL_TABLET | Freq: Once | ORAL | Status: AC
  Administered 2020-06-10: 650 mg via ORAL

## 2020-06-10 MED ORDER — DIPHENHYDRAMINE HCL 25 MG PO CAPS
50.0000 mg | ORAL_CAPSULE | Freq: Once | ORAL | Status: AC
  Administered 2020-06-10: 50 mg via ORAL

## 2020-06-10 MED ORDER — TRAMADOL HCL 50 MG PO TABS: 50.0000 mg | ORAL_TABLET | Freq: Two times a day (BID) | ORAL | 0 refills | Status: DC | PRN

## 2020-06-10 MED ORDER — ACETAMINOPHEN 325 MG PO TABS
ORAL_TABLET | ORAL | Status: AC
  Filled 2020-06-10: qty 2

## 2020-06-10 MED ORDER — HEPARIN SOD (PORK) LOCK FLUSH 100 UNIT/ML IV SOLN
500.0000 [IU] | Freq: Once | INTRAVENOUS | Status: AC | PRN
  Administered 2020-06-10: 500 [IU]
  Filled 2020-06-10: qty 5

## 2020-06-10 MED ORDER — TRASTUZUMAB-ANNS CHEMO 150 MG IV SOLR
6.0000 mg/kg | Freq: Once | INTRAVENOUS | Status: AC
  Administered 2020-06-10: 651 mg via INTRAVENOUS
  Filled 2020-06-10: qty 31

## 2020-06-10 MED ORDER — SODIUM CHLORIDE 0.9 % IV SOLN
Freq: Once | INTRAVENOUS | Status: AC
  Filled 2020-06-10: qty 250

## 2020-06-10 MED ORDER — SODIUM CHLORIDE 0.9% FLUSH
10.0000 mL | INTRAVENOUS | Status: DC | PRN
  Administered 2020-06-10: 10 mL
  Filled 2020-06-10: qty 10

## 2020-06-10 MED ORDER — DIPHENHYDRAMINE HCL 25 MG PO CAPS
ORAL_CAPSULE | ORAL | Status: AC
  Filled 2020-06-10: qty 2

## 2020-06-10 NOTE — Telephone Encounter (Signed)
Added appts per 6/22 sch message - pt is aware of appt date and times

## 2020-06-10 NOTE — Patient Instructions (Signed)
[facility] Discharge Instructions for Patients Receiving Chemotherapy  Today you received the following chemotherapy agents trastuzumab.  To help prevent nausea and vomiting after your treatment, we encourage you to take your nausea medication as directed.    If you develop nausea and vomiting that is not controlled by your nausea medication, call the clinic.   BELOW ARE SYMPTOMS THAT SHOULD BE REPORTED IMMEDIATELY:  *FEVER GREATER THAN 100.5 F  *CHILLS WITH OR WITHOUT FEVER  NAUSEA AND VOMITING THAT IS NOT CONTROLLED WITH YOUR NAUSEA MEDICATION  *UNUSUAL SHORTNESS OF BREATH  *UNUSUAL BRUISING OR BLEEDING  TENDERNESS IN MOUTH AND THROAT WITH OR WITHOUT PRESENCE OF ULCERS  *URINARY PROBLEMS  *BOWEL PROBLEMS  UNUSUAL RASH Items with * indicate a potential emergency and should be followed up as soon as possible.  Feel free to call the clinic should you have any questions or concerns. The clinic phone number is [phone].  Please show the CHEMO ALERT CARD at check-in to the Emergency Department and triage nurse.   

## 2020-06-10 NOTE — Telephone Encounter (Signed)
Scheduled appts per 6/22 los. Gave pt a print out of AVS.  

## 2020-06-10 NOTE — Assessment & Plan Note (Signed)
01/22/2020: Right medial lumpectomy: Grade 3 IDC 2.3 cm with high-grade DCIS with necrosis, margins negative, negative for lymphovascular or perineural invasion, Right lateral lumpectomy: Isolated foci of DCIS high-grade, resection margins negative 4 lymph nodes negative, ER 90%, PR 30%, HER-2 3+ positive, Ki-67 30%  Treatment plan: 1.Adjuvant chemotherapy with Taxol Herceptin weekly x 9 (stopped early because of lower extremity edema) followed by Herceptin maintenance 2.Adjuvant radiation therapy started 05/29/2020 3.Followed by adjuvant antiestrogen therapy. ----------------------------------------------------------------------------------------------------------------------------------------------------------- Current treatment: Herceptin maintenance and adjuvant radiation Echocardiogram 02/04/2020: EF 60 to 65%  Chemo toxicities: 1.Moderate to severe fatigue: Due to Taxol chemo  2.Lower extremity edema leftleg.  Required hospitalization for cellulitis treated with IV antibiotics. 3.Major depression: Currently on Prozac. 4.Diarrhea: Due to Herceptin. I encouraged her to take 2 Imodium's every morning to stop the diarrhea.   Leg pain: Currently on Dilaudid. Hypokalemia: On oral potassium daily.  Return to clinicevery 3 weeks for Herceptin maintenance and every 6 weeks to follow-up with me.Marland Kitchen

## 2020-06-11 ENCOUNTER — Ambulatory Visit
Admission: RE | Admit: 2020-06-11 | Discharge: 2020-06-11 | Disposition: A | Discharge status: Home / Self Care | Payer: Medicare PPO | Source: Ambulatory Visit | Attending: Radiation Oncology | Admitting: Radiation Oncology

## 2020-06-11 ENCOUNTER — Other Ambulatory Visit: Payer: Self-pay

## 2020-06-11 DIAGNOSIS — Z510 Encounter for antineoplastic radiation therapy: Secondary | ICD-10-CM | POA: Diagnosis not present

## 2020-06-12 ENCOUNTER — Other Ambulatory Visit: Payer: Self-pay

## 2020-06-12 ENCOUNTER — Ambulatory Visit
Admission: RE | Admit: 2020-06-12 | Discharge: 2020-06-12 | Disposition: A | Discharge status: Home / Self Care | Payer: Medicare PPO | Source: Ambulatory Visit | Attending: Radiation Oncology | Admitting: Radiation Oncology

## 2020-06-12 DIAGNOSIS — Z510 Encounter for antineoplastic radiation therapy: Secondary | ICD-10-CM | POA: Diagnosis not present

## 2020-06-13 ENCOUNTER — Ambulatory Visit
Admission: RE | Admit: 2020-06-13 | Discharge: 2020-06-13 | Disposition: A | Discharge status: Home / Self Care | Payer: Medicare PPO | Source: Ambulatory Visit | Attending: Radiation Oncology | Admitting: Radiation Oncology

## 2020-06-13 ENCOUNTER — Other Ambulatory Visit: Payer: Self-pay

## 2020-06-13 DIAGNOSIS — Z510 Encounter for antineoplastic radiation therapy: Secondary | ICD-10-CM | POA: Diagnosis not present

## 2020-06-16 ENCOUNTER — Other Ambulatory Visit: Payer: Self-pay

## 2020-06-16 ENCOUNTER — Ambulatory Visit
Admission: RE | Admit: 2020-06-16 | Discharge: 2020-06-16 | Disposition: A | Discharge status: Home / Self Care | Payer: Medicare PPO | Source: Ambulatory Visit | Attending: Radiation Oncology | Admitting: Radiation Oncology

## 2020-06-16 DIAGNOSIS — Z510 Encounter for antineoplastic radiation therapy: Secondary | ICD-10-CM | POA: Diagnosis not present

## 2020-06-17 ENCOUNTER — Ambulatory Visit: Payer: Medicare PPO | Admitting: Radiation Oncology

## 2020-06-17 ENCOUNTER — Other Ambulatory Visit: Payer: Self-pay

## 2020-06-17 ENCOUNTER — Ambulatory Visit
Admission: RE | Admit: 2020-06-17 | Discharge: 2020-06-17 | Disposition: A | Discharge status: Home / Self Care | Payer: Medicare PPO | Source: Ambulatory Visit | Attending: Radiation Oncology | Admitting: Radiation Oncology

## 2020-06-17 DIAGNOSIS — Z510 Encounter for antineoplastic radiation therapy: Secondary | ICD-10-CM | POA: Diagnosis not present

## 2020-06-18 ENCOUNTER — Other Ambulatory Visit: Payer: Self-pay

## 2020-06-18 ENCOUNTER — Ambulatory Visit
Admission: RE | Admit: 2020-06-18 | Discharge: 2020-06-18 | Disposition: A | Discharge status: Home / Self Care | Payer: Medicare PPO | Source: Ambulatory Visit | Attending: Radiation Oncology | Admitting: Radiation Oncology

## 2020-06-18 DIAGNOSIS — Z510 Encounter for antineoplastic radiation therapy: Secondary | ICD-10-CM | POA: Diagnosis not present

## 2020-06-19 ENCOUNTER — Ambulatory Visit
Admission: RE | Admit: 2020-06-19 | Discharge: 2020-06-19 | Disposition: A | Discharge status: Home / Self Care | Payer: Medicare PPO | Source: Ambulatory Visit | Attending: Radiation Oncology | Admitting: Radiation Oncology

## 2020-06-19 ENCOUNTER — Other Ambulatory Visit: Payer: Self-pay

## 2020-06-19 DIAGNOSIS — Z510 Encounter for antineoplastic radiation therapy: Secondary | ICD-10-CM | POA: Insufficient documentation

## 2020-06-19 DIAGNOSIS — C50211 Malignant neoplasm of upper-inner quadrant of right female breast: Secondary | ICD-10-CM | POA: Diagnosis not present

## 2020-06-19 DIAGNOSIS — Z170 Estrogen receptor positive status [ER+]: Secondary | ICD-10-CM | POA: Diagnosis not present

## 2020-06-20 ENCOUNTER — Ambulatory Visit
Admission: RE | Admit: 2020-06-20 | Discharge: 2020-06-20 | Disposition: A | Discharge status: Home / Self Care | Payer: Medicare PPO | Source: Ambulatory Visit | Attending: Radiation Oncology | Admitting: Radiation Oncology

## 2020-06-20 DIAGNOSIS — Z510 Encounter for antineoplastic radiation therapy: Secondary | ICD-10-CM | POA: Diagnosis not present

## 2020-06-20 DIAGNOSIS — C50211 Malignant neoplasm of upper-inner quadrant of right female breast: Secondary | ICD-10-CM | POA: Diagnosis not present

## 2020-06-20 DIAGNOSIS — Z170 Estrogen receptor positive status [ER+]: Secondary | ICD-10-CM | POA: Diagnosis not present

## 2020-06-24 ENCOUNTER — Encounter: Payer: Self-pay | Admitting: *Deleted

## 2020-06-24 ENCOUNTER — Other Ambulatory Visit: Payer: Self-pay

## 2020-06-24 ENCOUNTER — Ambulatory Visit
Admission: RE | Admit: 2020-06-24 | Discharge: 2020-06-24 | Disposition: A | Discharge status: Home / Self Care | Payer: Medicare PPO | Source: Ambulatory Visit | Attending: Radiation Oncology | Admitting: Radiation Oncology

## 2020-06-24 DIAGNOSIS — C50211 Malignant neoplasm of upper-inner quadrant of right female breast: Secondary | ICD-10-CM | POA: Diagnosis not present

## 2020-06-24 DIAGNOSIS — Z170 Estrogen receptor positive status [ER+]: Secondary | ICD-10-CM | POA: Diagnosis not present

## 2020-06-24 DIAGNOSIS — Z510 Encounter for antineoplastic radiation therapy: Secondary | ICD-10-CM | POA: Diagnosis not present

## 2020-06-25 ENCOUNTER — Other Ambulatory Visit: Payer: Self-pay

## 2020-06-25 ENCOUNTER — Ambulatory Visit
Admission: RE | Admit: 2020-06-25 | Discharge: 2020-06-25 | Disposition: A | Discharge status: Home / Self Care | Payer: Medicare PPO | Source: Ambulatory Visit | Attending: Radiation Oncology | Admitting: Radiation Oncology

## 2020-06-25 ENCOUNTER — Encounter: Payer: Self-pay | Admitting: Radiation Oncology

## 2020-06-25 DIAGNOSIS — C50211 Malignant neoplasm of upper-inner quadrant of right female breast: Secondary | ICD-10-CM | POA: Diagnosis not present

## 2020-06-25 DIAGNOSIS — Z510 Encounter for antineoplastic radiation therapy: Secondary | ICD-10-CM | POA: Diagnosis not present

## 2020-06-25 DIAGNOSIS — Z170 Estrogen receptor positive status [ER+]: Secondary | ICD-10-CM | POA: Diagnosis not present

## 2020-07-01 ENCOUNTER — Other Ambulatory Visit: Payer: Self-pay

## 2020-07-01 ENCOUNTER — Other Ambulatory Visit: Payer: Medicare PPO

## 2020-07-01 ENCOUNTER — Inpatient Hospital Stay: Payer: Medicare PPO

## 2020-07-01 ENCOUNTER — Encounter: Payer: Self-pay | Admitting: *Deleted

## 2020-07-01 ENCOUNTER — Inpatient Hospital Stay: Payer: Medicare PPO | Attending: Hematology and Oncology

## 2020-07-01 VITALS — BP 144/71 | HR 83 | Resp 18

## 2020-07-01 DIAGNOSIS — Z95828 Presence of other vascular implants and grafts: Secondary | ICD-10-CM

## 2020-07-01 DIAGNOSIS — C50211 Malignant neoplasm of upper-inner quadrant of right female breast: Secondary | ICD-10-CM

## 2020-07-01 DIAGNOSIS — Z5112 Encounter for antineoplastic immunotherapy: Secondary | ICD-10-CM | POA: Insufficient documentation

## 2020-07-01 DIAGNOSIS — Z170 Estrogen receptor positive status [ER+]: Secondary | ICD-10-CM | POA: Insufficient documentation

## 2020-07-01 DIAGNOSIS — K529 Noninfective gastroenteritis and colitis, unspecified: Secondary | ICD-10-CM

## 2020-07-01 LAB — CBC WITH DIFFERENTIAL (CANCER CENTER ONLY)
Abs Immature Granulocytes: 0.03 10*3/uL (ref 0.00–0.07)
Basophils Absolute: 0 10*3/uL (ref 0.0–0.1)
Basophils Relative: 0 %
Eosinophils Absolute: 0.2 10*3/uL (ref 0.0–0.5)
Eosinophils Relative: 3 %
HCT: 37.3 % (ref 36.0–46.0)
Hemoglobin: 12 g/dL (ref 12.0–15.0)
Immature Granulocytes: 0 %
Lymphocytes Relative: 16 %
Lymphs Abs: 1.2 10*3/uL (ref 0.7–4.0)
MCH: 31.7 pg (ref 26.0–34.0)
MCHC: 32.2 g/dL (ref 30.0–36.0)
MCV: 98.7 fL (ref 80.0–100.0)
Monocytes Absolute: 0.5 10*3/uL (ref 0.1–1.0)
Monocytes Relative: 8 %
Neutro Abs: 5 10*3/uL (ref 1.7–7.7)
Neutrophils Relative %: 73 %
Platelet Count: 241 10*3/uL (ref 150–400)
RBC: 3.78 MIL/uL — ABNORMAL LOW (ref 3.87–5.11)
RDW: 15.1 % (ref 11.5–15.5)
WBC Count: 7 10*3/uL (ref 4.0–10.5)
nRBC: 0 % (ref 0.0–0.2)

## 2020-07-01 LAB — CMP (CANCER CENTER ONLY)
ALT: 19 U/L (ref 0–44)
AST: 18 U/L (ref 15–41)
Albumin: 3.5 g/dL (ref 3.5–5.0)
Alkaline Phosphatase: 92 U/L (ref 38–126)
Anion gap: 9 (ref 5–15)
BUN: 16 mg/dL (ref 8–23)
CO2: 25 mmol/L (ref 22–32)
Calcium: 8.8 mg/dL — ABNORMAL LOW (ref 8.9–10.3)
Chloride: 104 mmol/L (ref 98–111)
Creatinine: 0.79 mg/dL (ref 0.44–1.00)
GFR, Est AFR Am: >60 mL/min (ref >60)
GFR, Estimated: >60 mL/min (ref >60)
Glucose, Bld: 93 mg/dL (ref 70–99)
Potassium: 4.4 mmol/L (ref 3.5–5.1)
Sodium: 138 mmol/L (ref 135–145)
Total Bilirubin: 0.2 mg/dL — ABNORMAL LOW (ref 0.3–1.2)
Total Protein: 6.9 g/dL (ref 6.5–8.1)

## 2020-07-01 MED ORDER — DIPHENHYDRAMINE HCL 25 MG PO CAPS
ORAL_CAPSULE | ORAL | Status: AC
  Filled 2020-07-01: qty 2

## 2020-07-01 MED ORDER — DIPHENHYDRAMINE HCL 25 MG PO CAPS
50.0000 mg | ORAL_CAPSULE | Freq: Once | ORAL | Status: AC
  Administered 2020-07-01: 50 mg via ORAL

## 2020-07-01 MED ORDER — SODIUM CHLORIDE 0.9 % IV SOLN
Freq: Once | INTRAVENOUS | Status: AC
  Filled 2020-07-01: qty 250

## 2020-07-01 MED ORDER — HEPARIN SOD (PORK) LOCK FLUSH 100 UNIT/ML IV SOLN
500.0000 [IU] | Freq: Once | INTRAVENOUS | Status: AC | PRN
  Administered 2020-07-01: 500 [IU]
  Filled 2020-07-01: qty 5

## 2020-07-01 MED ORDER — TRASTUZUMAB-ANNS CHEMO 150 MG IV SOLR
6.0000 mg/kg | Freq: Once | INTRAVENOUS | Status: AC
  Administered 2020-07-01: 651 mg via INTRAVENOUS
  Filled 2020-07-01: qty 31

## 2020-07-01 MED ORDER — SODIUM CHLORIDE 0.9% FLUSH
10.0000 mL | INTRAVENOUS | Status: DC | PRN
  Administered 2020-07-01: 10 mL via INTRAVENOUS
  Filled 2020-07-01: qty 10

## 2020-07-01 MED ORDER — SODIUM CHLORIDE 0.9% FLUSH
10.0000 mL | INTRAVENOUS | Status: DC | PRN
  Administered 2020-07-01: 10 mL
  Filled 2020-07-01: qty 10

## 2020-07-01 MED ORDER — LOPERAMIDE HCL 2 MG PO CAPS
ORAL_CAPSULE | ORAL | Status: AC
  Filled 2020-07-01: qty 1

## 2020-07-01 MED ORDER — ACETAMINOPHEN 325 MG PO TABS
650.0000 mg | ORAL_TABLET | Freq: Once | ORAL | Status: AC
  Administered 2020-07-01: 650 mg via ORAL

## 2020-07-01 MED ORDER — LOPERAMIDE HCL 2 MG PO CAPS
2.0000 mg | ORAL_CAPSULE | ORAL | Status: DC | PRN
  Administered 2020-07-01: 2 mg via ORAL

## 2020-07-01 MED ORDER — ACETAMINOPHEN 325 MG PO TABS
ORAL_TABLET | ORAL | Status: AC
  Filled 2020-07-01: qty 2

## 2020-07-01 NOTE — Patient Instructions (Signed)
[facility] Discharge Instructions for Patients Receiving Chemotherapy  Today you received the following chemotherapy agents: Kanjinti  To help prevent nausea and vomiting after your treatment, we encourage you to take your nausea medication as prescribed.    If you develop nausea and vomiting that is not controlled by your nausea medication, call the clinic.   BELOW ARE SYMPTOMS THAT SHOULD BE REPORTED IMMEDIATELY:  *FEVER GREATER THAN 100.5 F  *CHILLS WITH OR WITHOUT FEVER  NAUSEA AND VOMITING THAT IS NOT CONTROLLED WITH YOUR NAUSEA MEDICATION  *UNUSUAL SHORTNESS OF BREATH  *UNUSUAL BRUISING OR BLEEDING  TENDERNESS IN MOUTH AND THROAT WITH OR WITHOUT PRESENCE OF ULCERS  *URINARY PROBLEMS  *BOWEL PROBLEMS  UNUSUAL RASH Items with * indicate a potential emergency and should be followed up as soon as possible.  Feel free to call the clinic should you have any questions or concerns. The clinic phone number is [phone].  Please show the [facility] at check-in to the Emergency Department and triage nurse.

## 2020-07-02 NOTE — Progress Notes (Incomplete)
  Patient Name: [name] MRN: [id number] DOB: 12-10-1951 Referring Physician: Maurice Small, (Profile Not Attached) Date of Service: 06/25/2020 [facility], [place]                                                        End Of Treatment Note  Diagnoses: C50.211-Malignant neoplasm of upper-inner quadrant of right female breast  Cancer Staging: StageIA(pT2, pN0, cM0)RightBreast UIQ,Invasive DuctalCarcinomawith high-grade DCIS, ER+/ PR+/ Her2+, Grade3  Intent: Curative  Radiation Treatment Dates: 05/28/2020 through 06/25/2020 Site Technique Total Dose (Gy) Dose per Fx (Gy) Completed Fx Beam Energies  Breast, Right: Breast_Rt 3D 40.05/40.05 2.67 15/15 10X  Breast, Right: Breast_Rt_Bst 3D 10/10 2 5/5 6X, 10X   Narrative: The patient tolerated radiation therapy relatively well. She did report moderate to severe fatigue. She denied any breast pain. Towards the end of treatment, the right breast area was noted to be erythematous with hyperpigmentation changes, but no skin breakdown. There was also some radiation dermatitis in the upper inner aspect of the right breast.  Plan: The patient will follow-up with radiation oncology in one month.  ________________________________________________   Blair Promise, PhD, MD  This document serves as a record of services personally performed by Gery Pray, MD. It was created on his behalf by Clerance Lav, a trained medical scribe. The creation of this record is based on the scribe's personal observations and the provider's statements to them. This document has been checked and approved by the attending provider.

## 2020-07-21 NOTE — Progress Notes (Signed)
Patient Care Team: Maurice Small, MD as PCP - General (Family Medicine) Rockwell Germany, RN as Oncology Nurse Navigator Tressie Ellis, Paulette Blanch, RN as Oncology Nurse Navigator Erroll Luna, MD as Consulting Physician (General Surgery) Nicholas Lose, MD as Consulting Physician (Hematology and Oncology) Gery Pray, MD as Consulting Physician (Radiation Oncology)  DIAGNOSIS:    ICD-10-CM   1. Malignant neoplasm of upper-inner quadrant of right breast in female, estrogen receptor positive ([place])  C50.211    Z17.0     SUMMARY OF ONCOLOGIC HISTORY: Oncology History  Malignant neoplasm of upper-inner quadrant of right breast in female, estrogen receptor positive ([place])  12/26/2019 Initial Diagnosis   Patient palpated a right breast lump x1wk. Mammogram and US showed two adjacent masses at the 2 o'clock position measuring 1.4cm and 0.6cm, calcifications in the outer right breast at the 9 o'clock position, no right axillary adenopathy. Biopsy showed IDC at the 2 o'clock position, grade 3, HER-2 + (3+), ER+ 90%, PR+ 30%, Ki67 30%, and DCIS in the upper outer right breast, high grade, ER+ 95%, PR 90%.   01/22/2020 Surgery   Right breast lumpectomy x2 ([facility]): Medial position: IDC, grade 3, 2.3cm, with high grade DCIS, clear margins Lateral position: high grade DCIS, clear margins, 4 right axillary lymph nodes negative    01/22/2020 Cancer Staging   Staging form: Breast, AJCC 8th Edition - Pathologic stage from 01/22/2020: Stage IA (pT2, pN0, cM0, G3, ER+, PR+, HER2+) - Signed by Gardenia Phlegm, NP on 02/06/2020   02/19/2020 -  Chemotherapy   The patient had PACLitaxel (TAXOL) 180 mg in sodium chloride 0.9 % 250 mL chemo infusion (</= 53m/m2), 80 mg/m2 = 180 mg, Intravenous,  Once, 3 of 3 cycles Dose modification: 65 mg/m2 (original dose 80 mg/m2, Cycle 2, Reason: Dose not tolerated), 65 mg/m2 (original dose 80 mg/m2, Cycle 2, Reason: Dose not tolerated) Administration: 180 mg (02/19/2020), 180 mg  (02/26/2020), 180 mg (03/18/2020), 180 mg (03/04/2020), 180 mg (03/11/2020), 144 mg (03/25/2020), 144 mg (04/01/2020), 144 mg (04/08/2020), 144 mg (04/15/2020) trastuzumab-anns (KANJINTI) 441 mg in sodium chloride 0.9 % 250 mL chemo infusion, 4 mg/kg = 441 mg (100 % of original dose 4 mg/kg), Intravenous,  Once, 7 of 16 cycles Dose modification: 4 mg/kg (original dose 4 mg/kg, Cycle 1, Reason: Other (see comments)), 6 mg/kg (original dose 6 mg/kg, Cycle 4, Reason: Other (see comments), Comment: insurance preferred biosimilar), 6 mg/kg (original dose 6 mg/kg, Cycle 10, Reason: Other (see comments)) Administration: 441 mg (02/19/2020), 210 mg (02/26/2020), 210 mg (03/04/2020), 210 mg (03/11/2020), 210 mg (03/18/2020), 210 mg (03/25/2020), 210 mg (04/01/2020), 210 mg (04/08/2020), 210 mg (04/15/2020), 651 mg (04/29/2020), 651 mg (06/10/2020), 651 mg (07/01/2020)  for chemotherapy treatment.    05/29/2020 -  Radiation Therapy   Adjuvant radiation     CHIEF COMPLIANT: Herceptin maintenance   INTERVAL HISTORY: Isabel BUONOCOREis a 69y.o. with above-mentioned history of right breast cancerwhounderwent a right breast lumpectomy x2, adjuvant chemotherapy, and is currently on Herceptin maintenance and undergoing radiation.She presents to the clinic todayfortreatment.  Recently she has fallen and bruised her face extensively.  She does feel weak in her legs.  ALLERGIES:  is allergic to [employer]  MEDICATIONS:  Current Outpatient Medications  Medication Sig Dispense Refill  . amitriptyline (ELAVIL) 25 MG tablet Take 75 mg by mouth at bedtime.     .Marland KitchenamLODipine (NORVASC) 5 MG tablet Take 7.5 mg by mouth at bedtime.     . Buprenorphine  HCl (BELBUCA) 150 MCG FILM Place 150 mcg inside cheek in the morning and at bedtime. Prescribed by Dr. Maryjean Ka    . diazepam (VALIUM) 10 MG tablet Take 1 tablet (10 mg total) by mouth at bedtime as needed for sleep. 30 tablet 3  . doxycycline (VIBRAMYCIN) 50 MG capsule Take 2  capsules (100 mg total) by mouth 2 (two) times daily. 24 capsule 1  . FLUoxetine (PROZAC) 40 MG capsule Take 80 mg by mouth at bedtime.     . furosemide (LASIX) 20 MG tablet Take 1 tablet (20 mg total) by mouth daily as needed for fluid or edema. 30 tablet 3  . gabapentin (NEURONTIN) 100 MG capsule Take 1 capsule (100 mg total) by mouth 3 (three) times daily. 90 capsule 1  . ibuprofen (ADVIL,MOTRIN) 200 MG tablet Take 400-600 mg by mouth 2 (two) times daily as needed for moderate pain.     Marland Kitchen levothyroxine (SYNTHROID, LEVOTHROID) 137 MCG tablet Take 137 mcg by mouth at bedtime.     . lidocaine-prilocaine (EMLA) cream Apply to affected area once (Patient taking differently: Apply 1 application topically daily as needed (prior to port being accessed.). Apply to affected area once) 30 g 3  . ondansetron (ZOFRAN) 8 MG tablet Take 1 tablet (8 mg total) by mouth 2 (two) times daily as needed (Nausea or vomiting). 30 tablet 1  . oxycodone-acetaminophen (PERCOCET) 2.5-325 MG tablet Take 1 tablet by mouth every 4 (four) hours as needed for pain. 30 tablet 0  . potassium chloride SA (KLOR-CON) 20 MEQ tablet Take 1 tablet (20 mEq total) by mouth daily. 30 tablet 0  . prochlorperazine (COMPAZINE) 10 MG tablet Take 1 tablet (10 mg total) by mouth every 6 (six) hours as needed (Nausea or vomiting). 30 tablet 1   No current facility-administered medications for this visit.   Facility-Administered Medications Ordered in Other Visits  Medication Dose Route Frequency Provider Last Rate Last Admin  . heparin lock flush 100 unit/mL  500 Units Intracatheter Once PRN Nicholas Lose, MD      . sodium chloride flush (NS) 0.9 % injection 10 mL  10 mL Intracatheter PRN Nicholas Lose, MD      . Theotis [facility]) 651 mg in sodium chloride 0.9 % 250 mL chemo infusion  6 mg/kg (Treatment Plan Recorded) Intravenous Once Nicholas Lose, MD        PHYSICAL EXAMINATION: ECOG PERFORMANCE STATUS: 1 - Symptomatic but  completely ambulatory  Vitals:   07/22/20 1326  BP: 139/71  Pulse: 77  Resp: 18  Temp: 98.5 F (36.9 C)  SpO2: 99%   Filed Weights    LABORATORY DATA:  I have reviewed the data as listed CMP Latest Ref Rng & Units 07/22/2020 07/01/2020 06/10/2020  Glucose 70 - 99 mg/dL 93 93 102(H)  BUN 8 - 23 mg/dL _0 Creatinine 0.44 - 1.00 mg/dL 0.77 0.79 0.75  Sodium 135 - 145 mmol/L 141 138 143  Potassium 3.5 - 5.1 mmol/L 4.2 4.4 4.1  Chloride 98 - 111 mmol/L 104 104 104  CO2 22 - 32 mmol/L _1 Calcium 8.9 - 10.3 mg/dL 9.0 8.8(L) 8.6(L)  Total Protein 6.5 - 8.1 g/dL 6.6 6.9 6.6  Total Bilirubin 0.3 - 1.2 mg/dL 0.3 0.2(L) 0.4  Alkaline Phos 38 - 126 U/L 100 92 103  AST 15 - 41 U/L _2 ALT 0 - 44 U/L _3 Lab Results  Component Value Date  WBC 6.5 07/22/2020   HGB 11.9 (L) 07/22/2020   HCT 36.5 07/22/2020   MCV 97.1 07/22/2020   PLT 225 07/22/2020   NEUTROABS 4.7 07/22/2020    ASSESSMENT & PLAN:  Malignant neoplasm of upper-inner quadrant of right breast in female, estrogen receptor positive ([place]) 01/22/2020: Right medial lumpectomy: Grade 3 IDC 2.3 cm with high-grade DCIS with necrosis, margins negative, negative for lymphovascular or perineural invasion, Right lateral lumpectomy: Isolated foci of DCIS high-grade, resection margins negative 4 lymph nodes negative, ER 90%, PR 30%, HER-2 3+ positive, Ki-67 30%  Treatment plan: 1.Adjuvant chemotherapy with Taxol Herceptin weekly x9(stopped early because of lower extremity edema)followed by Herceptin maintenance 2.Adjuvant radiation therapy started 05/29/2020 3.Followed by adjuvant antiestrogen therapy. ----------------------------------------------------------------------------------------------------------------------------------------------------------- Current treatment:Herceptin maintenance and adjuvant radiation Echocardiogram 02/04/2020: EF 60 to 65%  Chemo toxicities: 1.Moderate to  severe fatigue:Due to Taxol chemo 2.Lower extremity edema leftleg.Required hospitalization for cellulitis treated with IV antibiotics. This continues to be swollen but under stable condition. I sent a new prescription for Lasix along with the doxycycline today. 3.Major depression: Currently on Prozac. 4.Diarrhea: Due to Herceptin.   5.  Pain issues: Discontinue tramadol and sent her prescription for Percocets 2.5 mg.  Extensive bruising on the face related to recent fall Leg pain: Discontinued Dilaudid and tramadol and prescribed her Percocets. Hypokalemia: On oral potassium daily.  Return to clinicevery 3 weeks for Herceptin maintenanceand every 6 weeks to follow-up with me..  No orders of the defined types were placed in this encounter.  The patient has a good understanding of the overall plan. she agrees with it. she will call with any problems that may develop before the next visit here.  Total time spent: 30 mins including face to face time and time spent for planning, charting and coordination of care  Nicholas Lose, MD 07/22/2020  I, Cloyde Reams Dorshimer, am acting as scribe for Dr. Nicholas Lose.  I have reviewed the above documentation for accuracy and completeness, and I agree with the above.

## 2020-07-22 ENCOUNTER — Inpatient Hospital Stay: Payer: Medicare PPO

## 2020-07-22 ENCOUNTER — Inpatient Hospital Stay: Payer: Medicare PPO | Admitting: Hematology and Oncology

## 2020-07-22 ENCOUNTER — Other Ambulatory Visit: Payer: Self-pay

## 2020-07-22 ENCOUNTER — Other Ambulatory Visit: Payer: Self-pay | Admitting: Hematology and Oncology

## 2020-07-22 ENCOUNTER — Inpatient Hospital Stay: Payer: Medicare PPO | Attending: Hematology and Oncology

## 2020-07-22 VITALS — Wt 247.2 lb

## 2020-07-22 DIAGNOSIS — C50211 Malignant neoplasm of upper-inner quadrant of right female breast: Secondary | ICD-10-CM

## 2020-07-22 DIAGNOSIS — Z170 Estrogen receptor positive status [ER+]: Secondary | ICD-10-CM

## 2020-07-22 DIAGNOSIS — Z5112 Encounter for antineoplastic immunotherapy: Secondary | ICD-10-CM | POA: Insufficient documentation

## 2020-07-22 DIAGNOSIS — Z23 Encounter for immunization: Secondary | ICD-10-CM | POA: Insufficient documentation

## 2020-07-22 DIAGNOSIS — Z95828 Presence of other vascular implants and grafts: Secondary | ICD-10-CM

## 2020-07-22 LAB — CMP (CANCER CENTER ONLY)
ALT: 18 U/L (ref 0–44)
AST: 16 U/L (ref 15–41)
Albumin: 3.4 g/dL — ABNORMAL LOW (ref 3.5–5.0)
Alkaline Phosphatase: 100 U/L (ref 38–126)
Anion gap: 11 (ref 5–15)
BUN: 17 mg/dL (ref 8–23)
CO2: 26 mmol/L (ref 22–32)
Calcium: 9 mg/dL (ref 8.9–10.3)
Chloride: 104 mmol/L (ref 98–111)
Creatinine: 0.77 mg/dL (ref 0.44–1.00)
GFR, Est AFR Am: >60 mL/min
GFR, Estimated: >60 mL/min
Glucose, Bld: 93 mg/dL (ref 70–99)
Potassium: 4.2 mmol/L (ref 3.5–5.1)
Sodium: 141 mmol/L (ref 135–145)
Total Bilirubin: 0.3 mg/dL (ref 0.3–1.2)
Total Protein: 6.6 g/dL (ref 6.5–8.1)

## 2020-07-22 LAB — CBC WITH DIFFERENTIAL (CANCER CENTER ONLY)
Abs Immature Granulocytes: 0.02 10*3/uL (ref 0.00–0.07)
Basophils Absolute: 0 10*3/uL (ref 0.0–0.1)
Basophils Relative: 1 %
Eosinophils Absolute: 0.2 10*3/uL (ref 0.0–0.5)
Eosinophils Relative: 2 %
HCT: 36.5 % (ref 36.0–46.0)
Hemoglobin: 11.9 g/dL — ABNORMAL LOW (ref 12.0–15.0)
Immature Granulocytes: 0 %
Lymphocytes Relative: 18 %
Lymphs Abs: 1.2 10*3/uL (ref 0.7–4.0)
MCH: 31.6 pg (ref 26.0–34.0)
MCHC: 32.6 g/dL (ref 30.0–36.0)
MCV: 97.1 fL (ref 80.0–100.0)
Monocytes Absolute: 0.5 10*3/uL (ref 0.1–1.0)
Monocytes Relative: 7 %
Neutro Abs: 4.7 10*3/uL (ref 1.7–7.7)
Neutrophils Relative %: 72 %
Platelet Count: 225 10*3/uL (ref 150–400)
RBC: 3.76 MIL/uL — ABNORMAL LOW (ref 3.87–5.11)
RDW: 15.4 % (ref 11.5–15.5)
WBC Count: 6.5 10*3/uL (ref 4.0–10.5)
nRBC: 0 % (ref 0.0–0.2)

## 2020-07-22 MED ORDER — FUROSEMIDE 20 MG PO TABS: 20.0000 mg | ORAL_TABLET | Freq: Every day | ORAL | 3 refills | Status: DC | PRN

## 2020-07-22 MED ORDER — DIPHENHYDRAMINE HCL 25 MG PO CAPS
50.0000 mg | ORAL_CAPSULE | Freq: Once | ORAL | Status: AC
  Administered 2020-07-22: 50 mg via ORAL

## 2020-07-22 MED ORDER — SODIUM CHLORIDE 0.9% FLUSH
10.0000 mL | INTRAVENOUS | Status: DC | PRN
  Administered 2020-07-22: 10 mL
  Filled 2020-07-22: qty 10

## 2020-07-22 MED ORDER — ACETAMINOPHEN 325 MG PO TABS
650.0000 mg | ORAL_TABLET | Freq: Once | ORAL | Status: AC
  Administered 2020-07-22: 650 mg via ORAL

## 2020-07-22 MED ORDER — SODIUM CHLORIDE 0.9 % IV SOLN
Freq: Once | INTRAVENOUS | Status: AC
  Filled 2020-07-22: qty 250

## 2020-07-22 MED ORDER — OXYCODONE-ACETAMINOPHEN 5-325 MG PO TABS: 1.0000 | ORAL_TABLET | ORAL | 0 refills | Status: DC | PRN

## 2020-07-22 MED ORDER — OXYCODONE-ACETAMINOPHEN 2.5-325 MG PO TABS: 1.0000 | ORAL_TABLET | ORAL | 0 refills | Status: DC | PRN

## 2020-07-22 MED ORDER — HEPARIN SOD (PORK) LOCK FLUSH 100 UNIT/ML IV SOLN
500.0000 [IU] | Freq: Once | INTRAVENOUS | Status: AC | PRN
  Administered 2020-07-22: 500 [IU]
  Filled 2020-07-22: qty 5

## 2020-07-22 MED ORDER — TRASTUZUMAB-ANNS CHEMO 150 MG IV SOLR
6.0000 mg/kg | Freq: Once | INTRAVENOUS | Status: AC
  Administered 2020-07-22: 651 mg via INTRAVENOUS
  Filled 2020-07-22: qty 31

## 2020-07-22 MED ORDER — DIPHENHYDRAMINE HCL 25 MG PO CAPS
ORAL_CAPSULE | ORAL | Status: AC
  Filled 2020-07-22: qty 2

## 2020-07-22 MED ORDER — ACETAMINOPHEN 325 MG PO TABS
ORAL_TABLET | ORAL | Status: AC
  Filled 2020-07-22: qty 2

## 2020-07-22 MED ORDER — DOXYCYCLINE HYCLATE 50 MG PO CAPS: 100.0000 mg | ORAL_CAPSULE | Freq: Two times a day (BID) | ORAL | 1 refills | Status: DC

## 2020-07-22 MED ORDER — SODIUM CHLORIDE 0.9% FLUSH
10.0000 mL | Freq: Once | INTRAVENOUS | Status: AC
  Administered 2020-07-22: 10 mL
  Filled 2020-07-22: qty 10

## 2020-07-22 NOTE — Patient Instructions (Signed)
[facility] Discharge Instructions for Patients Receiving Chemotherapy  Today you received the following chemotherapy agents: Kanjinti  To help prevent nausea and vomiting after your treatment, we encourage you to take your nausea medication as prescribed.    If you develop nausea and vomiting that is not controlled by your nausea medication, call the clinic.   BELOW ARE SYMPTOMS THAT SHOULD BE REPORTED IMMEDIATELY:  *FEVER GREATER THAN 100.5 F  *CHILLS WITH OR WITHOUT FEVER  NAUSEA AND VOMITING THAT IS NOT CONTROLLED WITH YOUR NAUSEA MEDICATION  *UNUSUAL SHORTNESS OF BREATH  *UNUSUAL BRUISING OR BLEEDING  TENDERNESS IN MOUTH AND THROAT WITH OR WITHOUT PRESENCE OF ULCERS  *URINARY PROBLEMS  *BOWEL PROBLEMS  UNUSUAL RASH Items with * indicate a potential emergency and should be followed up as soon as possible.  Feel free to call the clinic should you have any questions or concerns. The clinic phone number is [phone].  Please show the [facility] at check-in to the Emergency Department and triage nurse.

## 2020-07-22 NOTE — Assessment & Plan Note (Signed)
01/22/2020: Right medial lumpectomy: Grade 3 IDC 2.3 cm with high-grade DCIS with necrosis, margins negative, negative for lymphovascular or perineural invasion, Right lateral lumpectomy: Isolated foci of DCIS high-grade, resection margins negative 4 lymph nodes negative, ER 90%, PR 30%, HER-2 3+ positive, Ki-67 30%  Treatment plan: 1.Adjuvant chemotherapy with Taxol Herceptin weekly x9(stopped early because of lower extremity edema)followed by Herceptin maintenance 2.Adjuvant radiation therapy started 05/29/2020 3.Followed by adjuvant antiestrogen therapy. ----------------------------------------------------------------------------------------------------------------------------------------------------------- Current treatment:Herceptin maintenance and adjuvant radiation Echocardiogram 02/04/2020: EF 60 to 65%  Chemo toxicities: 1.Moderate to severe fatigue:Due to Taxol chemo 2.Lower extremity edema leftleg.Required hospitalization for cellulitis treated with IV antibiotics. This continues to be swollen but under stable condition. 3.Major depression: Currently on Prozac. 4.Diarrhea: Due to Herceptin. I encouraged her to take 2 Imodium's every morning to stop the diarrhea. 5.  Pain issues: I prescribed tramadol today.  Leg pain: Discontinued Dilaudid and prescribed tramadol. Hypokalemia: On oral potassium daily.  Return to clinicevery 3 weeks for Herceptin maintenanceand every 6 weeks to follow-up with me.Marland Kitchen

## 2020-07-23 ENCOUNTER — Telehealth: Payer: Self-pay | Admitting: Hematology and Oncology

## 2020-07-23 ENCOUNTER — Telehealth: Payer: Self-pay | Admitting: *Deleted

## 2020-07-23 NOTE — Telephone Encounter (Signed)
CALLED PATIENT TO ASK QUESTION, LVM FOR A RETURN CALL 

## 2020-07-23 NOTE — Telephone Encounter (Signed)
No 8/3 los, no changes made to pt schedule

## 2020-07-28 ENCOUNTER — Ambulatory Visit
Admission: RE | Admit: 2020-07-28 | Discharge: 2020-07-28 | Disposition: A | Discharge status: Home / Self Care | Payer: Medicare PPO | Source: Ambulatory Visit | Attending: Radiation Oncology | Admitting: Radiation Oncology

## 2020-08-04 ENCOUNTER — Other Ambulatory Visit: Payer: Self-pay

## 2020-08-04 ENCOUNTER — Encounter: Payer: Self-pay | Admitting: Radiation Oncology

## 2020-08-04 ENCOUNTER — Ambulatory Visit
Admission: RE | Admit: 2020-08-04 | Discharge: 2020-08-04 | Disposition: A | Discharge status: Home / Self Care | Payer: Medicare PPO | Source: Ambulatory Visit | Attending: Radiation Oncology | Admitting: Radiation Oncology

## 2020-08-04 DIAGNOSIS — R5383 Other fatigue: Secondary | ICD-10-CM | POA: Diagnosis not present

## 2020-08-04 DIAGNOSIS — Z79899 Other long term (current) drug therapy: Secondary | ICD-10-CM | POA: Diagnosis not present

## 2020-08-04 DIAGNOSIS — Z923 Personal history of irradiation: Secondary | ICD-10-CM | POA: Diagnosis not present

## 2020-08-04 DIAGNOSIS — C50211 Malignant neoplasm of upper-inner quadrant of right female breast: Secondary | ICD-10-CM | POA: Insufficient documentation

## 2020-08-04 DIAGNOSIS — Z170 Estrogen receptor positive status [ER+]: Secondary | ICD-10-CM | POA: Insufficient documentation

## 2020-08-04 DIAGNOSIS — R600 Localized edema: Secondary | ICD-10-CM | POA: Diagnosis not present

## 2020-08-04 NOTE — Progress Notes (Signed)
Patient here for a 1 month f/u visit with Dr. Sondra Come. Patient reports being in the hospital recently for Lymphedema in her left leg. Has moderate fatigue.  Denies pain or problems with her right breast.  BP (!) 144/61 (BP Location: Left Wrist, Patient Position: Sitting)   Pulse 96   Temp 98.4 F (36.9 C) (Oral)   Resp 18   Ht 5\' 7"  (1.702 m)   LMP  (LMP Unknown)   SpO2 95%   BMI 38.72 kg/m   Wt Readings from Last 3 Encounters:  07/22/20 247 lb 4 oz (112.2 kg)  06/10/20 244 lb 3.2 oz (110.8 kg)  05/15/20 242 lb 9.6 oz (110 kg)

## 2020-08-04 NOTE — Progress Notes (Signed)
Radiation Oncology         [phone] ________________________________  Name: [name] MRN: [id number]   Date: 08/04/2020  DOB: 08/01/1951  Follow-Up Visit Note  CC: Maurice Small, MD  Maurice Small, MD    ICD-10-CM   1. Malignant neoplasm of upper-inner quadrant of right breast in female, estrogen receptor positive ([facility])  C50.211    Z17.0     Diagnosis: StageIA(pT2, pN0, cM0)RightBreast UIQ,Invasive DuctalCarcinomawith high-grade DCIS, ER+/ PR+/ Her2+, Grade3  Interval Since Last Radiation: One month, one week, and two days.  Radiation Treatment Dates: 05/28/2020 through 06/25/2020 Site Technique Total Dose (Gy) Dose per Fx (Gy) Completed Fx Beam Energies  Breast, Right: Breast_Rt 3D 40.05/40.05 2.67 15/15 10X  Breast, Right: Breast_Rt_Bst 3D 10/10 2 5/5 6X, 10X    Narrative:  The patient returns today for routine follow-up. Since the end of treatment, the patient was seen by Dr. Lindi Adie on 07/22/2020. Adjuvant chemotherapy with Taxol and Herceptin was discontinued early secondary to lower extremity edema. The patient is now on Herceptin maintenance.    On review of systems, she reports ongoing fatigue. She denies nipple discharge or bleeding.  She denies any pain within the breast area.                   ALLERGIES:  is allergic to [employer] tartrate].  Meds: Current Outpatient Medications  Medication Sig Dispense Refill  . amitriptyline (ELAVIL) 25 MG tablet Take 75 mg by mouth at bedtime.     Marland Kitchen amLODipine (NORVASC) 5 MG tablet Take 7.5 mg by mouth at bedtime.     . Buprenorphine HCl (BELBUCA) 150 MCG FILM Place 150 mcg inside cheek in the morning and at bedtime. Prescribed by Dr. Maryjean Ka    . diazepam (VALIUM) 10 MG tablet Take 1 tablet (10 mg total) by mouth at bedtime as needed for sleep. 30 tablet 3  . doxycycline (VIBRAMYCIN) 50 MG capsule Take 2 capsules (100 mg total) by mouth 2 (two) times daily. 24 capsule 1  . FLUoxetine (PROZAC) 40 MG capsule  Take 80 mg by mouth at bedtime.     . furosemide (LASIX) 20 MG tablet Take 1 tablet (20 mg total) by mouth daily as needed for fluid or edema. 30 tablet 3  . ibuprofen (ADVIL,MOTRIN) 200 MG tablet Take 400-600 mg by mouth 2 (two) times daily as needed for moderate pain.     Marland Kitchen levothyroxine (SYNTHROID, LEVOTHROID) 137 MCG tablet Take 137 mcg by mouth at bedtime.     . lidocaine-prilocaine (EMLA) cream Apply to affected area once (Patient taking differently: Apply 1 application topically daily as needed (prior to port being accessed.). Apply to affected area once) 30 g 3  . oxyCODONE-acetaminophen (PERCOCET) 5-325 MG tablet Take 1 tablet by mouth every 4 (four) hours as needed for severe pain. 30 tablet 0  . gabapentin (NEURONTIN) 100 MG capsule Take 1 capsule (100 mg total) by mouth 3 (three) times daily. (Patient not taking: Reported on 08/04/2020) 90 capsule 1  . ondansetron (ZOFRAN) 8 MG tablet Take 1 tablet (8 mg total) by mouth 2 (two) times daily as needed (Nausea or vomiting). (Patient not taking: Reported on 08/04/2020) 30 tablet 1  . prochlorperazine (COMPAZINE) 10 MG tablet Take 1 tablet (10 mg total) by mouth every 6 (six) hours as needed (Nausea or vomiting). (Patient not taking: Reported on 08/04/2020) 30 tablet 1   No current facility-administered medications for this encounter.    Physical Findings: The patient  is in no acute distress. Patient is alert and oriented.  height is '5\' 7"'  (1.702 m). Her oral temperature is 98.4 F (36.9 C). Her blood pressure is 144/61 (abnormal) and her pulse is 96. Her respiration is 18 and oxygen saturation is 95%.   No significant changes. Lungs are clear to auscultation bilaterally. Heart has regular rate and rhythm. No palpable cervical, supraclavicular, or axillary adenopathy. Abdomen soft, non-tender, normal bowel sounds. Left breast: No palpable mass, nipple discharge, or bleeding.  Right breast: Skin is healed well.  Mild hyperpigmentation changes  noted.  No dominant mass appreciated in the breast nipple discharge or bleeding.  Lab Findings: Lab Results  Component Value Date   WBC 6.5 07/22/2020   HGB 11.9 (L) 07/22/2020   HCT 36.5 07/22/2020   MCV 97.1 07/22/2020   PLT 225 07/22/2020    Radiographic Findings: No results found.  Impression: StageIA(pT2, pN0, cM0)RightBreast UIQ,Invasive DuctalCarcinomawith high-grade DCIS, ER+/ PR+/ Her2+, Grade3  The patient is recovering from the effects of radiation.  She continues to have a lot of fatigue related to her multidisciplinary therapy.  She reports no residual discomfort in the breast area.  No signs of local recurrence.  Plan: The patient is scheduled to follow-up with Dr. Lindi Adie on 09/02/2020.  As needed follow-up in radiation oncology.    ____________________________________   Blair Promise, PhD, MD  This document serves as a record of services personally performed by Gery Pray, MD. It was created on his behalf by Clerance Lav, a trained medical scribe. The creation of this record is based on the scribe's personal observations and the Marquese Burkland's statements to them. This document has been checked and approved by the attending Clayborne Divis.

## 2020-08-12 ENCOUNTER — Inpatient Hospital Stay: Payer: Medicare PPO

## 2020-08-12 ENCOUNTER — Other Ambulatory Visit: Payer: Self-pay

## 2020-08-12 VITALS — BP 164/79 | HR 86 | Temp 98.5°F | Resp 16

## 2020-08-12 DIAGNOSIS — Z23 Encounter for immunization: Secondary | ICD-10-CM

## 2020-08-12 DIAGNOSIS — Z5112 Encounter for antineoplastic immunotherapy: Secondary | ICD-10-CM | POA: Diagnosis not present

## 2020-08-12 DIAGNOSIS — C50211 Malignant neoplasm of upper-inner quadrant of right female breast: Secondary | ICD-10-CM | POA: Diagnosis not present

## 2020-08-12 DIAGNOSIS — Z170 Estrogen receptor positive status [ER+]: Secondary | ICD-10-CM | POA: Diagnosis not present

## 2020-08-12 LAB — CMP (CANCER CENTER ONLY)
ALT: 16 U/L (ref 0–44)
AST: 15 U/L (ref 15–41)
Albumin: 3.3 g/dL — ABNORMAL LOW (ref 3.5–5.0)
Alkaline Phosphatase: 109 U/L (ref 38–126)
Anion gap: 10 (ref 5–15)
BUN: 25 mg/dL — ABNORMAL HIGH (ref 8–23)
CO2: 27 mmol/L (ref 22–32)
Calcium: 9.3 mg/dL (ref 8.9–10.3)
Chloride: 104 mmol/L (ref 98–111)
Creatinine: 0.79 mg/dL (ref 0.44–1.00)
GFR, Est AFR Am: >60 mL/min (ref >60)
GFR, Estimated: >60 mL/min (ref >60)
Glucose, Bld: 117 mg/dL — ABNORMAL HIGH (ref 70–99)
Potassium: 4.1 mmol/L (ref 3.5–5.1)
Sodium: 141 mmol/L (ref 135–145)
Total Bilirubin: 0.3 mg/dL (ref 0.3–1.2)
Total Protein: 6.6 g/dL (ref 6.5–8.1)

## 2020-08-12 LAB — CBC WITH DIFFERENTIAL (CANCER CENTER ONLY)
Abs Immature Granulocytes: 0.02 10*3/uL (ref 0.00–0.07)
Basophils Absolute: 0 10*3/uL (ref 0.0–0.1)
Basophils Relative: 0 %
Eosinophils Absolute: 0.1 10*3/uL (ref 0.0–0.5)
Eosinophils Relative: 1 %
HCT: 37.1 % (ref 36.0–46.0)
Hemoglobin: 12.2 g/dL (ref 12.0–15.0)
Immature Granulocytes: 0 %
Lymphocytes Relative: 17 %
Lymphs Abs: 1 10*3/uL (ref 0.7–4.0)
MCH: 31.6 pg (ref 26.0–34.0)
MCHC: 32.9 g/dL (ref 30.0–36.0)
MCV: 96.1 fL (ref 80.0–100.0)
Monocytes Absolute: 0.6 10*3/uL (ref 0.1–1.0)
Monocytes Relative: 10 %
Neutro Abs: 4.3 10*3/uL (ref 1.7–7.7)
Neutrophils Relative %: 72 %
Platelet Count: 214 10*3/uL (ref 150–400)
RBC: 3.86 MIL/uL — ABNORMAL LOW (ref 3.87–5.11)
RDW: 15.9 % — ABNORMAL HIGH (ref 11.5–15.5)
WBC Count: 6 10*3/uL (ref 4.0–10.5)
nRBC: 0 % (ref 0.0–0.2)

## 2020-08-12 MED ORDER — HEPARIN SOD (PORK) LOCK FLUSH 100 UNIT/ML IV SOLN
500.0000 [IU] | Freq: Once | INTRAVENOUS | Status: AC | PRN
  Administered 2020-08-12: 500 [IU]
  Filled 2020-08-12: qty 5

## 2020-08-12 MED ORDER — DIPHENHYDRAMINE HCL 25 MG PO CAPS
50.0000 mg | ORAL_CAPSULE | Freq: Once | ORAL | Status: AC
  Administered 2020-08-12: 50 mg via ORAL

## 2020-08-12 MED ORDER — TRASTUZUMAB-ANNS CHEMO 150 MG IV SOLR
6.0000 mg/kg | Freq: Once | INTRAVENOUS | Status: AC
  Administered 2020-08-12: 651 mg via INTRAVENOUS
  Filled 2020-08-12: qty 31

## 2020-08-12 MED ORDER — SODIUM CHLORIDE 0.9 % IV SOLN
Freq: Once | INTRAVENOUS | Status: AC
  Filled 2020-08-12: qty 250

## 2020-08-12 MED ORDER — DIPHENHYDRAMINE HCL 25 MG PO CAPS
ORAL_CAPSULE | ORAL | Status: AC
  Filled 2020-08-12: qty 2

## 2020-08-12 MED ORDER — ACETAMINOPHEN 325 MG PO TABS
ORAL_TABLET | ORAL | Status: AC
  Filled 2020-08-12: qty 2

## 2020-08-12 MED ORDER — SODIUM CHLORIDE 0.9% FLUSH
10.0000 mL | INTRAVENOUS | Status: DC | PRN
  Administered 2020-08-12: 10 mL
  Filled 2020-08-12: qty 10

## 2020-08-12 MED ORDER — ACETAMINOPHEN 325 MG PO TABS
650.0000 mg | ORAL_TABLET | Freq: Once | ORAL | Status: AC
  Administered 2020-08-12: 650 mg via ORAL

## 2020-08-12 NOTE — Progress Notes (Signed)
Patient completed 15 minute post-injection observation period with no adverse effects.

## 2020-08-12 NOTE — Progress Notes (Signed)
   Covid-19 Vaccination Clinic  Name:  [name]    MRN: [id number] DOB: 1951-04-07  08/12/2020  [name] was observed post Covid-19 immunization for 15 minutes without incident. She was provided with Vaccine Information Sheet and instruction to access the V-Safe system.   [name] was instructed to call 911 with any severe reactions post vaccine: Marland Kitchen Difficulty breathing  . Swelling of face and throat  . A fast heartbeat  . A bad rash all over body  . Dizziness and weakness   Immunizations Administered    Name Date Dose VIS Date Route   Pfizer COVID-19 Vaccine 08/12/2020  1:46 PM 0.3 mL 02/13/2019 Intramuscular   Manufacturer: [place]   Lot: [id number]   [place]: [id number]

## 2020-08-12 NOTE — Progress Notes (Signed)
Encounter completed under flush appt.

## 2020-08-14 ENCOUNTER — Telehealth: Payer: Self-pay | Admitting: *Deleted

## 2020-08-14 NOTE — Telephone Encounter (Signed)
Received call from pt stating she is continuing to experience left leg pain not relieved by percocet 5-325.  Pt requesting for an increase in pain medication or change in therapy.  RN will review with MD for further evaluation and treatment.

## 2020-08-15 ENCOUNTER — Other Ambulatory Visit: Payer: Self-pay | Admitting: *Deleted

## 2020-08-15 ENCOUNTER — Other Ambulatory Visit: Payer: Self-pay | Admitting: Adult Health

## 2020-08-15 ENCOUNTER — Telehealth: Payer: Self-pay

## 2020-08-15 MED ORDER — OXYCODONE-ACETAMINOPHEN 5-325 MG PO TABS
2.0000 | ORAL_TABLET | Freq: Four times a day (QID) | ORAL | 0 refills | Status: DC | PRN
Start: 2020-08-15 — End: 2020-08-18

## 2020-08-15 NOTE — Telephone Encounter (Signed)
Pt called [facility]3 since speaking this morning. Wilber Bihari, NP unable to fill. Dr Marin Olp sent in 20 tabs of Percocet 5/325 with instructions to take 2 tabs q6h PRN pain. Message sent to Dr Lindi Adie to fill the rest of Rx Monday. Pt is aware her Rx has been sent to [facility]. Verbalized thanks and understanding.

## 2020-08-15 NOTE — Progress Notes (Signed)
Received request to refill percocet for left leg cellulitis that was treated with hospitalization and IV antibiotics on 04/17/2020.  PMP aware was reviewed, and refills are consistent with her taking 1-2 percocet per day.    Since I cannot find a cancer related cause for her pain in the chart, and this is a chronic issue, I will forward this request to the physician on call to refill.    Wilber Bihari, NP

## 2020-08-15 NOTE — Telephone Encounter (Signed)
Pt called this morning stating she is in excruciating pain with her leg. Per Dr Geralyn Flash note, he would like to increase pt's Percocet to 5/325 2 tabs PO q8h. Note was given to Wilber Bihari, NP who will enter Rx for pt. Pt is aware this is being sent in for her. Verbalized thanks and understanding.

## 2020-08-18 ENCOUNTER — Other Ambulatory Visit: Payer: Self-pay | Admitting: Hematology and Oncology

## 2020-08-18 MED ORDER — OXYCODONE-ACETAMINOPHEN 5-325 MG PO TABS
2.0000 | ORAL_TABLET | Freq: Four times a day (QID) | ORAL | 0 refills | Status: DC | PRN
Start: 2020-08-18 — End: 2020-09-02

## 2020-08-21 ENCOUNTER — Other Ambulatory Visit: Payer: Self-pay

## 2020-08-21 ENCOUNTER — Emergency Department (HOSPITAL_BASED_OUTPATIENT_CLINIC_OR_DEPARTMENT_OTHER): Payer: Medicare PPO

## 2020-08-21 ENCOUNTER — Encounter (HOSPITAL_BASED_OUTPATIENT_CLINIC_OR_DEPARTMENT_OTHER): Payer: Self-pay

## 2020-08-21 ENCOUNTER — Emergency Department (HOSPITAL_BASED_OUTPATIENT_CLINIC_OR_DEPARTMENT_OTHER)
Admission: EM | Admit: 2020-08-21 | Discharge: 2020-08-21 | Disposition: A | Discharge status: Home / Self Care | Payer: Medicare PPO | Attending: Emergency Medicine | Admitting: Emergency Medicine

## 2020-08-21 DIAGNOSIS — S4992XA Unspecified injury of left shoulder and upper arm, initial encounter: Secondary | ICD-10-CM | POA: Diagnosis present

## 2020-08-21 DIAGNOSIS — Z79890 Hormone replacement therapy (postmenopausal): Secondary | ICD-10-CM | POA: Insufficient documentation

## 2020-08-21 DIAGNOSIS — S42202A Unspecified fracture of upper end of left humerus, initial encounter for closed fracture: Secondary | ICD-10-CM | POA: Diagnosis not present

## 2020-08-21 DIAGNOSIS — R21 Rash and other nonspecific skin eruption: Secondary | ICD-10-CM | POA: Diagnosis not present

## 2020-08-21 DIAGNOSIS — C50211 Malignant neoplasm of upper-inner quadrant of right female breast: Secondary | ICD-10-CM | POA: Diagnosis not present

## 2020-08-21 DIAGNOSIS — S42292A Other displaced fracture of upper end of left humerus, initial encounter for closed fracture: Secondary | ICD-10-CM | POA: Diagnosis not present

## 2020-08-21 DIAGNOSIS — S42352A Displaced comminuted fracture of shaft of humerus, left arm, initial encounter for closed fracture: Secondary | ICD-10-CM | POA: Diagnosis not present

## 2020-08-21 DIAGNOSIS — W010XXA Fall on same level from slipping, tripping and stumbling without subsequent striking against object, initial encounter: Secondary | ICD-10-CM | POA: Diagnosis not present

## 2020-08-21 DIAGNOSIS — Z79899 Other long term (current) drug therapy: Secondary | ICD-10-CM | POA: Diagnosis not present

## 2020-08-21 DIAGNOSIS — I10 Essential (primary) hypertension: Secondary | ICD-10-CM | POA: Diagnosis not present

## 2020-08-21 DIAGNOSIS — Y999 Unspecified external cause status: Secondary | ICD-10-CM | POA: Insufficient documentation

## 2020-08-21 DIAGNOSIS — E039 Hypothyroidism, unspecified: Secondary | ICD-10-CM | POA: Diagnosis not present

## 2020-08-21 DIAGNOSIS — Y9389 Activity, other specified: Secondary | ICD-10-CM | POA: Diagnosis not present

## 2020-08-21 DIAGNOSIS — Y9289 Other specified places as the place of occurrence of the external cause: Secondary | ICD-10-CM | POA: Insufficient documentation

## 2020-08-21 MED ORDER — HYDROMORPHONE HCL 1 MG/ML IJ SOLN
1.0000 mg | Freq: Once | INTRAMUSCULAR | Status: AC
  Administered 2020-08-21: 1 mg via INTRAVENOUS
  Filled 2020-08-21: qty 1

## 2020-08-21 MED ORDER — FENTANYL CITRATE (PF) 100 MCG/2ML IJ SOLN
50.0000 ug | Freq: Once | INTRAMUSCULAR | Status: AC
  Administered 2020-08-21: 50 ug via INTRAVENOUS
  Filled 2020-08-21: qty 2

## 2020-08-21 NOTE — Discharge Instructions (Addendum)
Wear your arm sling at ALL times including in bed - until you are seen and cleared by the orthopedic doctor.    I placed a consult with our case manager.  They should call you in the next day or two to talk about home health aides.

## 2020-08-21 NOTE — ED Notes (Signed)
Port a cath right chest de-accessed per protocol prior to discharge.  Pt tolerated well.

## 2020-08-21 NOTE — ED Triage Notes (Signed)
Pt arrives with c/o pain to LUE after a fall on Tuesday. Pt has bruising and swelling to LUE.

## 2020-08-21 NOTE — ED Triage Notes (Signed)
Pt did take percocet at home around 1 pm.

## 2020-08-21 NOTE — ED Provider Notes (Signed)
[facility] EMERGENCY DEPARTMENT Provider Note   CSN: [id number] Arrival date & time: 08/21/20  1524     History Chief Complaint  Patient presents with  . Arm Pain    [name] is a 69 y.o. female who presents emergency department left upper arm pain after mechanical fall 2 days ago.  The patient reports that she lost her balance and fell onto her left side, directly onto her left arm 2 days ago.  She reported bruising and swelling and pain in her left shoulder.  She denies numbness in her left hand.  She denies injuries to any other part of her body.  She lives alone with her husband in a one-story house.  He reports to me that she is recently started chemotherapy and has been generally weak from this.  She has had falls before in the past.  They have no other immediate family in the area to help them.  HPI     Past Medical History:  Diagnosis Date  . Anxiety   . Cancer (HCC)    breast  . Chronic pain   . Concussion 3/08   ICU x 3 days  . Depression   . Hypertension   . Hypothyroidism   . Spinal stenosis   . Thyroid disease ?1994    Patient Active Problem List   Diagnosis Date Noted  . Port-A-Cath in place 07/22/2020  . Left leg cellulitis 04/17/2020  . Cellulitis of left leg 04/17/2020  . Hypokalemia 04/17/2020  . Macrocytic anemia 04/17/2020  . Anxiety 04/17/2020  . Depression 04/17/2020  . Chronic diarrhea 04/17/2020  . Chemotherapy induced diarrhea 04/17/2020  . Malignant neoplasm of upper-inner quadrant of right breast in female, estrogen receptor positive ([facility]) 12/26/2019  . Encephalopathy 02/02/2018  . Hypothyroidism 02/02/2018  . AKI (acute kidney injury) ([facility]) 02/02/2018  . Chronic back pain 02/02/2018  . Alcohol abuse 02/02/2018  . Weight gain 02/02/2018  . Benign essential HTN 02/02/2018    Past Surgical History:  Procedure Laterality Date  . BREAST LUMPECTOMY WITH RADIOACTIVE SEED AND SENTINEL LYMPH NODE BIOPSY Right 01/22/2020     Procedure: RIGHT BREAST LUMPECTOMY WITH RADIOACTIVE SEED X2 AND RIGHT SENTINEL LYMPH NODE MAPPING;  Surgeon: Erroll Luna, MD;  Location: [facility];  Service: General;  Laterality: Right;  . BREAST SURGERY Right 4/99   breast biopsy, benign  . PORTACATH PLACEMENT Right 01/22/2020   Procedure: INSERTION PORT-A-CATH WITH ULTRASOUND;  Surgeon: Erroll Luna, MD;  Location: [facility];  Service: General;  Laterality: Right;  . PORTACATH PLACEMENT Right 02/28/2020   Procedure: PORT A CATH REVISION;  Surgeon: Erroll Luna, MD;  Location: [facility];  Service: General;  Laterality: Right;  . SPINAL FUSION  03/04/11   with ORIF     OB History    Gravida  1   Para  1   Term  1   Preterm      AB      Living  1     SAB      TAB      Ectopic      Multiple      Live Births              Family History  Problem Relation Age of Onset  . Thyroid disease Mother   . Dementia Mother   . Diabetes Father   . Stroke Father   . Hypertension Sister   . Diabetes Sister  Social History   Tobacco Use  . Smoking status: Former Smoker    Types: Cigarettes  . Smokeless tobacco: Never Used  Vaping Use  . Vaping Use: Never used  Substance Use Topics  . Alcohol use: Yes    Alcohol/week: 5.0 standard drinks    Types: 5 Standard drinks or equivalent per week    Comment: wine  . Drug use: No    Home Medications Prior to Admission medications   Medication Sig Start Date End Date Taking? Authorizing Provider  amitriptyline (ELAVIL) 25 MG tablet Take 75 mg by mouth at bedtime.     [provider]  amLODipine (NORVASC) 5 MG tablet Take 7.5 mg by mouth at bedtime.     [provider]  Buprenorphine HCl (BELBUCA) 150 MCG FILM Place 150 mcg inside cheek in the morning and at bedtime. Prescribed by Dr. Maryjean Ka    [provider]  diazepam (VALIUM) 10 MG tablet Take 1 tablet (10 mg total) by mouth at bedtime as needed for sleep.  06/10/20   Nicholas Lose, MD  doxycycline (VIBRAMYCIN) 50 MG capsule Take 2 capsules (100 mg total) by mouth 2 (two) times daily. 07/22/20   Nicholas Lose, MD  FLUoxetine (PROZAC) 40 MG capsule Take 80 mg by mouth at bedtime.  01/01/18   [provider]  furosemide (LASIX) 20 MG tablet Take 1 tablet (20 mg total) by mouth daily as needed for fluid or edema. 07/22/20   Nicholas Lose, MD  gabapentin (NEURONTIN) 100 MG capsule Take 1 capsule (100 mg total) by mouth 3 (three) times daily. Patient not taking: Reported on 08/04/2020 04/24/20   Rai, Vernelle Emerald, MD  ibuprofen (ADVIL,MOTRIN) 200 MG tablet Take 400-600 mg by mouth 2 (two) times daily as needed for moderate pain.     [provider]  levothyroxine (SYNTHROID, LEVOTHROID) 137 MCG tablet Take 137 mcg by mouth at bedtime.     [provider]  lidocaine-prilocaine (EMLA) cream Apply to affected area once Patient taking differently: Apply 1 application topically daily as needed (prior to port being accessed.). Apply to affected area once 01/30/20   Nicholas Lose, MD  ondansetron (ZOFRAN) 8 MG tablet Take 1 tablet (8 mg total) by mouth 2 (two) times daily as needed (Nausea or vomiting). Patient not taking: Reported on 08/04/2020 01/30/20   Nicholas Lose, MD  oxyCODONE-acetaminophen (PERCOCET) 5-325 MG tablet Take 2 tablets by mouth every 6 (six) hours as needed for severe pain. 08/18/20   Nicholas Lose, MD  prochlorperazine (COMPAZINE) 10 MG tablet Take 1 tablet (10 mg total) by mouth every 6 (six) hours as needed (Nausea or vomiting). Patient not taking: Reported on 08/04/2020 01/30/20   Nicholas Lose, MD    Allergies    Ambien [zolpidem tartrate]  Review of Systems   Review of Systems  Constitutional: Negative for chills and fever.  Eyes: Negative for pain and visual disturbance.  Respiratory: Negative for cough and shortness of breath.   Cardiovascular: Negative for chest pain and palpitations.  Gastrointestinal: Negative for  abdominal pain and vomiting.  Musculoskeletal: Positive for arthralgias and myalgias.  Skin: Positive for color change and rash.  Neurological: Negative for weakness and numbness.  Psychiatric/Behavioral: Negative for agitation and confusion.  All other systems reviewed and are negative.   Physical Exam Updated Vital Signs BP 136/78 (BP Location: Right Arm)   Pulse 84   Temp 98.8 F (37.1 C)   Resp 16   Ht 5\' 7"  (1.702 m)  Wt 90.7 kg   LMP  (LMP Unknown)   SpO2 98%   BMI 31.32 kg/m   Physical Exam Vitals and nursing note reviewed.  Constitutional:      General: She is not in acute distress.    Appearance: She is well-developed.  HENT:     Head: Normocephalic and atraumatic.  Eyes:     Conjunctiva/sclera: Conjunctivae normal.     Pupils: Pupils are equal, round, and reactive to light.  Cardiovascular:     Rate and Rhythm: Normal rate and regular rhythm.     Pulses: Normal pulses.  Pulmonary:     Effort: Pulmonary effort is normal. No respiratory distress.     Breath sounds: Normal breath sounds.  Musculoskeletal:     Cervical back: Neck supple.     Comments: Ecchymosis of left upper arm Tenderness of left shoulder NO focal ttp of the left elbow, left forearm, or left wrist No additional injuries noted on exam  Skin:    General: Skin is warm and dry.  Neurological:     General: No focal deficit present.     Mental Status: She is alert and oriented to person, place, and time.     Sensory: No sensory deficit.     Motor: No weakness.  Psychiatric:        Mood and Affect: Mood normal.        Behavior: Behavior normal.     ED Results / Procedures / Treatments   Labs (all labs ordered are listed, but only abnormal results are displayed) Labs Reviewed - No data to display  EKG None  Radiology DG Humerus Left  Result Date: 08/21/2020 CLINICAL DATA:  Pt arrives with severe pain to LUE after a fall on Tuesday. Pt has bruising and swelling to entire LUE.with  limited ROM . No previous injury known. EXAM: LEFT HUMERUS - 2+ VIEW COMPARISON:  None. FINDINGS: Fracture of the proximal humerus. Transverse fracture across the proximal humeral metaphysis with a secondary fracture component the greater tuberosity. Shaft fracture component is mildly displaced laterally by approximately 1 cm. Humeral head is displaced inferiorly consistent shoulder joint hemarthrosis. No dislocation. No other fractures. Elbow joint normally spaced and aligned. Skeletal structures are demineralized. IMPRESSION: 1. Mildly comminuted and displaced fracture of proximal left humerus as described. No dislocation. Electronically Signed   By: Lajean Manes M.D.   On: 08/21/2020 16:24    Procedures Procedures (including critical care time)  Medications Ordered in ED Medications  HYDROmorphone (DILAUDID) injection 1 mg (1 mg Intravenous Given 08/21/20 1726)  fentaNYL (SUBLIMAZE) injection 50 mcg (50 mcg Intravenous Given 08/21/20 1831)    ED Course  I have reviewed the triage vital signs and the nursing notes.  Pertinent labs & imaging results that were available during my care of the patient were reviewed by me and considered in my medical decision making (see chart for details).   69 yo female presenting w/ mechanical fall onto left shoulder, found to have comminuted fx of left humerus.  Closed fx.  Neurovascularly intact.  Discussed with Dr Philipp Ovens from orthopedics as noted below - okay for sling w/ office f/u.   Iv dilaudid given for pain control, and additional 50 mcg of fentanyl.  She has PO meds at home (percocet) from her oncologist and can continue these.  We discussed ice as well.    Clinical Course as of Aug 22 113  Thu Aug 21, 2020  1730 Dr Zollie Beckers, Marga Hoots, states patient can  be placed in sling and can f/u with their office at [facility] next week.  He does not need CT imaging at this time.   [MT]  1759 Pain improved after dilaudid.  Discussed  plan for sling and ortho f/u with patient and husband, who is helping her home.  They've requested a CM consult for home health assistance, which I've placed.  She already has percocet at home from her oncologist and can continue this with motrin for pain.   [MT]    Clinical Course User Index [MT] Jenkins Risdon, Carola Rhine, MD    Final Clinical Impression(s) / ED Diagnoses Final diagnoses:  Closed fracture of proximal end of left humerus, unspecified fracture morphology, initial encounter    Rx / DC Orders ED Discharge Orders    None       Wyvonnia Dusky, MD 08/22/20 [phone]

## 2020-08-21 NOTE — TOC Initial Note (Signed)
Transition of Care [facility]) - Initial/Assessment Note    Patient Details  Name: [name] MRN: [id number] Date of Birth: November 19, 1951  Transition of Care [facility]) CM/SW Contact:    Erenest Rasher, RN Phone Number: [phone] 08/21/2020, 6:55 PM  Clinical Narrative:                 TOC CM spoke to pt's husband. Offered choice for [facility]. He is agreeable to [facility]. Referral sent to [facility] rep, Ronalee Belts. Pt was accepted for [facility]. Pt has RW and cane at home. Husband will be at home to assist pt.   Expected Discharge Plan: [place] Barriers to Discharge: No Barriers Identified   Patient Goals and CMS Choice Patient states their goals for this hospitalization and ongoing recovery are:: wants to go home CMS Medicare.gov Compare Post Acute Care list provided to:: Patient Represenative (must comment) Caffie Damme - husband) Choice offered to / list presented to : Spouse  Expected Discharge Plan and Services Expected Discharge Plan: [place] In-house Referral: Clinical Social Work Discharge Planning Services: CM Consult Post Acute Care Choice: [place] arrangements for the past 2 months: [place]: PT, OT [place] Agency: [facility] (formerly [employer]) Date [place]: 08/21/20 Time [place]: [phone] Representative spoke with at [place]: Joen Laura  Prior Living Arrangements/Services Living arrangements for the past 2 months: [place] Lives with:: Spouse Patient language and need for interpreter reviewed:: Yes Do you feel safe going back to the place where you live?: Yes      Need for Family Participation in Patient Care: Yes (Comment) Care giver support system in place?: Yes (comment) Current home services: DME (cane, rolling walker) Criminal Activity/Legal Involvement Pertinent to Current Situation/Hospitalization: No - Comment as  needed  Activities of Daily Living      Permission Sought/Granted Permission sought to share information with : Case Manager, PCP, Family Supports Permission granted to share information with : Yes, Verbal Permission Granted  Share Information with NAME: Carlyon Nolasco  Permission granted to share info w AGENCY: [place] granted to share info w Relationship: husband  Permission granted to share info w Contact Information: [phone]  Emotional Assessment           Psych Involvement: No (comment)  Admission diagnosis:  Fall Left Arm Pain Patient Active Problem List   Diagnosis Date Noted  . Port-A-Cath in place 07/22/2020  . Left leg cellulitis 04/17/2020  . Cellulitis of left leg 04/17/2020  . Hypokalemia 04/17/2020  . Macrocytic anemia 04/17/2020  . Anxiety 04/17/2020  . Depression 04/17/2020  . Chronic diarrhea 04/17/2020  . Chemotherapy induced diarrhea 04/17/2020  . Malignant neoplasm of upper-inner quadrant of right breast in female, estrogen receptor positive ([place]) 12/26/2019  . Encephalopathy 02/02/2018  . Hypothyroidism 02/02/2018  . AKI (acute kidney injury) ([place]) 02/02/2018  . Chronic back pain 02/02/2018  . Alcohol abuse 02/02/2018  . Weight gain 02/02/2018  . Benign essential HTN 02/02/2018   PCP:  Maurice Small, MD Pharmacy:   [facility] [address] [place] [address] Phone: [phone] Fax: [phone]     Social Determinants of Health (SDOH) Interventions  Readmission Risk Interventions No flowsheet data found.

## 2020-08-27 ENCOUNTER — Ambulatory Visit: Payer: Medicare PPO | Admitting: Orthopaedic Surgery

## 2020-08-27 ENCOUNTER — Encounter: Payer: Self-pay | Admitting: Orthopaedic Surgery

## 2020-08-27 DIAGNOSIS — S42292A Other displaced fracture of upper end of left humerus, initial encounter for closed fracture: Secondary | ICD-10-CM | POA: Diagnosis not present

## 2020-08-27 MED ORDER — METHOCARBAMOL 500 MG PO TABS: 500.0000 mg | ORAL_TABLET | Freq: Four times a day (QID) | ORAL | 1 refills | Status: DC | PRN

## 2020-08-27 NOTE — Progress Notes (Signed)
Office Visit Note   Patient: [name]           Date of Birth: [birth year]           MRN: [id number] Visit Date: 08/27/2020              Requested by: Maurice Small, MD [address] [place],  [place] [address] PCP: Maurice Small, MD   Assessment & Plan: Visit Diagnoses:  1. Closed 3-part fracture of proximal humerus, left, initial encounter     Plan: Obviously we need to try to treat this nonoperative given her comorbidities.  I did readjust her sling and talked her about pain control with adding Robaxin and ibuprofen to her pain medication regimen.  I would like to see her back in 2 weeks with 2 views of the left shoulder.  These need to be internal and external rotated views of the left shoulder.  All questions and concerns were answered and addressed.  Follow-Up Instructions: Return in about 2 weeks (around 09/10/2020).   Orders:  No orders of the defined types were placed in this encounter.  Meds ordered this encounter  Medications  . methocarbamol (ROBAXIN) 500 MG tablet    Sig: Take 1 tablet (500 mg total) by mouth every 6 (six) hours as needed for muscle spasms.    Dispense:  60 tablet    Refill:  1      Procedures: No procedures performed   Clinical Data: No additional findings.   Subjective: Chief Complaint  Patient presents with  . Left Arm - Injury  Patient is now seen for the first time.  She is about a week out from an injury where she fell at home injuring her left shoulder.  She is in a sling and has a proximal humerus fracture that is been well-documented.  She is someone who is been going through breast cancer treatments.  She has had a lot of falls as well.  She ambulates with a cane.  She does report quite severe left shoulder pain she is not injured this shoulder before.  She is right-hand dominant.  She is not a diabetic.  She is on Percocet for pain and does take gabapentin  HPI  Review of Systems She currently denies any  shortness of breath or chest pain.  She denies any fever, chills, nausea, vomiting  Objective: Vital Signs: LMP  (LMP Unknown)   Physical Exam She is alert and orient x3 and in no acute distress Ortho Exam Examination of her left shoulder shows that is clinically located but significantly painful and bruised.  I did readjust her sling. Specialty Comments:  No specialty comments available.  Imaging: No results found. X-rays of the left humerus done on 08/21/2020 show a proximal humerus fracture that is at least a 3 part fracture.  The humeral head is located.  PMFS History: Patient Active Problem List   Diagnosis Date Noted  . Port-A-Cath in place 07/22/2020  . Left leg cellulitis 04/17/2020  . Cellulitis of left leg 04/17/2020  . Hypokalemia 04/17/2020  . Macrocytic anemia 04/17/2020  . Anxiety 04/17/2020  . Depression 04/17/2020  . Chronic diarrhea 04/17/2020  . Chemotherapy induced diarrhea 04/17/2020  . Malignant neoplasm of upper-inner quadrant of right breast in female, estrogen receptor positive ([place]) 12/26/2019  . Encephalopathy 02/02/2018  . Hypothyroidism 02/02/2018  . AKI (acute kidney injury) ([place]) 02/02/2018  . Chronic back pain 02/02/2018  . Alcohol abuse 02/02/2018  . Weight gain  02/02/2018  . Benign essential HTN 02/02/2018   Past Medical History:  Diagnosis Date  . Anxiety   . Cancer (HCC)    breast  . Chronic pain   . Concussion 3/08   ICU x 3 days  . Depression   . Hypertension   . Hypothyroidism   . Spinal stenosis   . Thyroid disease ?1994    Family History  Problem Relation Age of Onset  . Thyroid disease Mother   . Dementia Mother   . Diabetes Father   . Stroke Father   . Hypertension Sister   . Diabetes Sister     Past Surgical History:  Procedure Laterality Date  . BREAST LUMPECTOMY WITH RADIOACTIVE SEED AND SENTINEL LYMPH NODE BIOPSY Right 01/22/2020   Procedure: RIGHT BREAST LUMPECTOMY WITH RADIOACTIVE SEED X2 AND RIGHT SENTINEL  LYMPH NODE MAPPING;  Surgeon: Erroll Luna, MD;  Location: [facility];  Service: General;  Laterality: Right;  . BREAST SURGERY Right 4/99   breast biopsy, benign  . PORTACATH PLACEMENT Right 01/22/2020   Procedure: INSERTION PORT-A-CATH WITH ULTRASOUND;  Surgeon: Erroll Luna, MD;  Location: [facility];  Service: General;  Laterality: Right;  . PORTACATH PLACEMENT Right 02/28/2020   Procedure: PORT A CATH REVISION;  Surgeon: Erroll Luna, MD;  Location: [facility];  Service: General;  Laterality: Right;  . SPINAL FUSION  03/04/11   with ORIF   Social History   Occupational History  . Not on file  Tobacco Use  . Smoking status: Former Smoker    Types: Cigarettes  . Smokeless tobacco: Never Used  Vaping Use  . Vaping Use: Never used  Substance and Sexual Activity  . Alcohol use: Yes    Alcohol/week: 5.0 standard drinks    Types: 5 Standard drinks or equivalent per week    Comment: wine  . Drug use: No  . Sexual activity: Never    Partners: Male    Birth control/protection: Post-menopausal

## 2020-08-29 DIAGNOSIS — I10 Essential (primary) hypertension: Secondary | ICD-10-CM | POA: Diagnosis not present

## 2020-08-29 DIAGNOSIS — S42202D Unspecified fracture of upper end of left humerus, subsequent encounter for fracture with routine healing: Secondary | ICD-10-CM | POA: Diagnosis not present

## 2020-08-29 DIAGNOSIS — F419 Anxiety disorder, unspecified: Secondary | ICD-10-CM | POA: Diagnosis not present

## 2020-08-29 DIAGNOSIS — F329 Major depressive disorder, single episode, unspecified: Secondary | ICD-10-CM | POA: Diagnosis not present

## 2020-08-29 DIAGNOSIS — G8929 Other chronic pain: Secondary | ICD-10-CM | POA: Diagnosis not present

## 2020-08-29 DIAGNOSIS — E039 Hypothyroidism, unspecified: Secondary | ICD-10-CM | POA: Diagnosis not present

## 2020-08-29 DIAGNOSIS — D539 Nutritional anemia, unspecified: Secondary | ICD-10-CM | POA: Diagnosis not present

## 2020-08-29 DIAGNOSIS — C50211 Malignant neoplasm of upper-inner quadrant of right female breast: Secondary | ICD-10-CM | POA: Diagnosis not present

## 2020-08-29 DIAGNOSIS — M4800 Spinal stenosis, site unspecified: Secondary | ICD-10-CM | POA: Diagnosis not present

## 2020-09-01 ENCOUNTER — Telehealth: Payer: Self-pay

## 2020-09-01 ENCOUNTER — Other Ambulatory Visit: Payer: Self-pay | Admitting: Orthopaedic Surgery

## 2020-09-01 DIAGNOSIS — F329 Major depressive disorder, single episode, unspecified: Secondary | ICD-10-CM | POA: Diagnosis not present

## 2020-09-01 DIAGNOSIS — D539 Nutritional anemia, unspecified: Secondary | ICD-10-CM | POA: Diagnosis not present

## 2020-09-01 DIAGNOSIS — S42202D Unspecified fracture of upper end of left humerus, subsequent encounter for fracture with routine healing: Secondary | ICD-10-CM | POA: Diagnosis not present

## 2020-09-01 DIAGNOSIS — E039 Hypothyroidism, unspecified: Secondary | ICD-10-CM | POA: Diagnosis not present

## 2020-09-01 DIAGNOSIS — C50211 Malignant neoplasm of upper-inner quadrant of right female breast: Secondary | ICD-10-CM | POA: Diagnosis not present

## 2020-09-01 DIAGNOSIS — M4800 Spinal stenosis, site unspecified: Secondary | ICD-10-CM | POA: Diagnosis not present

## 2020-09-01 DIAGNOSIS — G8929 Other chronic pain: Secondary | ICD-10-CM | POA: Diagnosis not present

## 2020-09-01 DIAGNOSIS — F419 Anxiety disorder, unspecified: Secondary | ICD-10-CM | POA: Diagnosis not present

## 2020-09-01 DIAGNOSIS — I10 Essential (primary) hypertension: Secondary | ICD-10-CM | POA: Diagnosis not present

## 2020-09-01 NOTE — Progress Notes (Signed)
Patient Care Team: Maurice Small, MD as PCP - General (Family Medicine) Rockwell Germany, RN as Oncology Nurse Navigator Tressie Ellis, Paulette Blanch, RN as Oncology Nurse Navigator Erroll Luna, MD as Consulting Physician (General Surgery) Nicholas Lose, MD as Consulting Physician (Hematology and Oncology) Gery Pray, MD as Consulting Physician (Radiation Oncology)  DIAGNOSIS:    ICD-10-CM   1. Malignant neoplasm of upper-inner quadrant of right breast in female, estrogen receptor positive ([place])  C50.211    Z17.0     SUMMARY OF ONCOLOGIC HISTORY: Oncology History  Malignant neoplasm of upper-inner quadrant of right breast in female, estrogen receptor positive ([place])  12/26/2019 Initial Diagnosis   Patient palpated a right breast lump x1wk. Mammogram and US showed two adjacent masses at the 2 o'clock position measuring 1.4cm and 0.6cm, calcifications in the outer right breast at the 9 o'clock position, no right axillary adenopathy. Biopsy showed IDC at the 2 o'clock position, grade 3, HER-2 + (3+), ER+ 90%, PR+ 30%, Ki67 30%, and DCIS in the upper outer right breast, high grade, ER+ 95%, PR 90%.   01/22/2020 Surgery   Right breast lumpectomy x2 ([place]): Medial position: IDC, grade 3, 2.3cm, with high grade DCIS, clear margins Lateral position: high grade DCIS, clear margins, 4 right axillary lymph nodes negative    01/22/2020 Cancer Staging   Staging form: Breast, AJCC 8th Edition - Pathologic stage from 01/22/2020: Stage IA (pT2, pN0, cM0, G3, ER+, PR+, HER2+) - Signed by Gardenia Phlegm, NP on 02/06/2020   02/19/2020 -  Chemotherapy   The patient had PACLitaxel (TAXOL) 180 mg in sodium chloride 0.9 % 250 mL chemo infusion (</= 61m/m2), 80 mg/m2 = 180 mg, Intravenous,  Once, 3 of 3 cycles Dose modification: 65 mg/m2 (original dose 80 mg/m2, Cycle 2, Reason: Dose not tolerated), 65 mg/m2 (original dose 80 mg/m2, Cycle 2, Reason: Dose not tolerated) Administration: 180 mg (02/19/2020), 180 mg  (02/26/2020), 180 mg (03/18/2020), 180 mg (03/04/2020), 180 mg (03/11/2020), 144 mg (03/25/2020), 144 mg (04/01/2020), 144 mg (04/08/2020), 144 mg (04/15/2020) trastuzumab-anns (KANJINTI) 441 mg in sodium chloride 0.9 % 250 mL chemo infusion, 4 mg/kg = 441 mg (100 % of original dose 4 mg/kg), Intravenous,  Once, 8 of 16 cycles Dose modification: 4 mg/kg (original dose 4 mg/kg, Cycle 1, Reason: Other (see comments)), 6 mg/kg (original dose 6 mg/kg, Cycle 4, Reason: Other (see comments), Comment: insurance preferred biosimilar), 6 mg/kg (original dose 6 mg/kg, Cycle 10, Reason: Other (see comments)) Administration: 441 mg (02/19/2020), 210 mg (02/26/2020), 210 mg (03/04/2020), 210 mg (03/11/2020), 210 mg (03/18/2020), 210 mg (03/25/2020), 210 mg (04/01/2020), 210 mg (04/08/2020), 210 mg (04/15/2020), 651 mg (04/29/2020), 651 mg (06/10/2020), 651 mg (07/01/2020), 651 mg (07/22/2020), 651 mg (08/12/2020)  for chemotherapy treatment.    05/29/2020 - 06/25/2020 Radiation Therapy   Adjuvant radiation     CHIEF COMPLIANT: Herceptin maintenance  INTERVAL HISTORY: Isabel LARMONis a 69y.o. with above-mentioned history of right breast cancerwhounderwent a right breast lumpectomy x2,adjuvant chemotherapy, radiation, and is currently onHerceptinmaintenance.She presents to the clinic todayfortreatment.   She is tolerating Herceptin extremely well without any problems or concerns.  ALLERGIES:  is allergic to aTeachers Insurance and Annuity Associationtartrate].  MEDICATIONS:  Current Outpatient Medications  Medication Sig Dispense Refill  . amitriptyline (ELAVIL) 25 MG tablet Take 75 mg by mouth at bedtime.     .Marland KitchenamLODipine (NORVASC) 5 MG tablet Take 7.5 mg by mouth at bedtime.     . Buprenorphine HCl (BELBUCA) 150 MCG FILM  Place 150 mcg inside cheek in the morning and at bedtime. Prescribed by Dr. Harkins    . diazepam (VALIUM) 10 MG tablet Take 1 tablet (10 mg total) by mouth at bedtime as needed for sleep. 30 tablet 3  . doxycycline (VIBRAMYCIN)  50 MG capsule Take 2 capsules (100 mg total) by mouth 2 (two) times daily. 24 capsule 1  . FLUoxetine (PROZAC) 40 MG capsule Take 80 mg by mouth at bedtime.     . furosemide (LASIX) 20 MG tablet Take 1 tablet (20 mg total) by mouth daily as needed for fluid or edema. 30 tablet 3  . gabapentin (NEURONTIN) 100 MG capsule Take 1 capsule (100 mg total) by mouth 3 (three) times daily. (Patient not taking: Reported on 08/04/2020) 90 capsule 1  . ibuprofen (ADVIL,MOTRIN) 200 MG tablet Take 400-600 mg by mouth 2 (two) times daily as needed for moderate pain.     . levothyroxine (SYNTHROID, LEVOTHROID) 137 MCG tablet Take 137 mcg by mouth at bedtime.     . lidocaine-prilocaine (EMLA) cream Apply to affected area once (Patient taking differently: Apply 1 application topically daily as needed (prior to port being accessed.). Apply to affected area once) 30 g 3  . methocarbamol (ROBAXIN) 500 MG tablet Take 1 tablet (500 mg total) by mouth every 6 (six) hours as needed for muscle spasms. 60 tablet 1  . ondansetron (ZOFRAN) 8 MG tablet Take 1 tablet (8 mg total) by mouth 2 (two) times daily as needed (Nausea or vomiting). (Patient not taking: Reported on 08/04/2020) 30 tablet 1  . oxyCODONE-acetaminophen (PERCOCET) 5-325 MG tablet Take 2 tablets by mouth every 6 (six) hours as needed for severe pain. 30 tablet 0  . prochlorperazine (COMPAZINE) 10 MG tablet Take 1 tablet (10 mg total) by mouth every 6 (six) hours as needed (Nausea or vomiting). (Patient not taking: Reported on 08/04/2020) 30 tablet 1   No current facility-administered medications for this visit.    PHYSICAL EXAMINATION: ECOG PERFORMANCE STATUS: 1 - Symptomatic but completely ambulatory  There were no vitals filed for this visit. There were no vitals filed for this visit.  LABORATORY DATA:  I have reviewed the data as listed CMP Latest Ref Rng & Units 08/12/2020 07/22/2020 07/01/2020  Glucose 70 - 99 mg/dL 117(H) 93 93  BUN 8 - 23 mg/dL 25(H) 17  16  Creatinine 0.44 - 1.00 mg/dL 0.79 0.77 0.79  Sodium 135 - 145 mmol/L 141 141 138  Potassium 3.5 - 5.1 mmol/L 4.1 4.2 4.4  Chloride 98 - 111 mmol/L 104 104 104  CO2 22 - 32 mmol/L 27 26 25  Calcium 8.9 - 10.3 mg/dL 9.3 9.0 8.8(L)  Total Protein 6.5 - 8.1 g/dL 6.6 6.6 6.9  Total Bilirubin 0.3 - 1.2 mg/dL 0.3 0.3 0.2(L)  Alkaline Phos 38 - 126 U/L 109 100 92  AST 15 - 41 U/L 15 16 18  ALT 0 - 44 U/L 16 18 19    Lab Results  Component Value Date   WBC 6.0 08/12/2020   HGB 12.2 08/12/2020   HCT 37.1 08/12/2020   MCV 96.1 08/12/2020   PLT 214 08/12/2020   NEUTROABS 4.3 08/12/2020    ASSESSMENT & PLAN:  Malignant neoplasm of upper-inner quadrant of right breast in female, estrogen receptor positive (HCC) 01/22/2020: Right medial lumpectomy: Grade 3 IDC 2.3 cm with high-grade DCIS with necrosis, margins negative, negative for lymphovascular or perineural invasion, Right lateral lumpectomy: Isolated foci of DCIS high-grade, resection margins negative   4 lymph nodes negative, ER 90%, PR 30%, HER-2 3+ positive, Ki-67 30%  Treatment plan: 1.Adjuvant chemotherapy with Taxol Herceptin weekly x9(stopped early because of lower extremity edema)followed by Herceptin maintenance 2.Adjuvant radiation therapystarted 05/29/2020 completed 06/25/2020 3.Followed by adjuvant antiestrogen therapy. ----------------------------------------------------------------------------------------------------------------------------------------------------------- Current treatment:Herceptin maintenance and adjuvant radiation Echocardiogram 02/04/2020: EF 60 to 65%  Chemo toxicities: 1.Moderate to severe fatigue:Due to Taxol chemo 2.Lower extremity edema leftleg.Required hospitalization for cellulitis treated with IV antibiotics. Patient has Lasix but she is been holding it because of recent fracture  3.Major depression: Currently on Prozac. 4.Diarrhea: Due to Herceptin.   5.Pain issues:   Currently on Percocets 2.5 mg.  Left arm fracture: After recent fall Leg pain:Discontinued Dilaudid and tramadol and prescribed her Percocets. Hypokalemia:On oral potassium daily.  Return to clinicevery 3 weeks for Herceptin maintenanceand every 6 weeks to follow-up with me.  She completes her treatment February 2022.   No orders of the defined types were placed in this encounter.  The patient has a good understanding of the overall plan. she agrees with it. she will call with any problems that may develop before the next visit here.  Total time spent: 30 mins including face to face time and time spent for planning, charting and coordination of care  , , MD 09/02/2020  I, Molly Dorshimer, am acting as scribe for Dr.  .  I have reviewed the above documentation for accuracy and completeness, and I agree with the above.       

## 2020-09-01 NOTE — Telephone Encounter (Signed)
Please advise 

## 2020-09-01 NOTE — Telephone Encounter (Signed)
Patient aware of the below message  

## 2020-09-01 NOTE — Telephone Encounter (Signed)
There is really no other medication that I can recommend.  She actually got 100 Percocet tablets from someone recently.  I added Robaxin and recommended ibuprofen as well.  She is already on strong narcotics

## 2020-09-01 NOTE — Telephone Encounter (Signed)
My concern is that was the 100 tablets that she got on just August 30 which was 2 weeks ago.  I cannot feel that amount of medication and can only provide 30 pills.  The pharmacy may not fill medication since again it was 100 tablets on the 30th.

## 2020-09-01 NOTE — Telephone Encounter (Signed)
She's actually not wanting something different She just wants a refill She states PT told her to call here for refill since she is needing it for her fracture The last refill was from her cancer doc She states she takes two every 6 hours [facility]

## 2020-09-01 NOTE — Telephone Encounter (Signed)
[place], PT with [facility] called stating that patient is in pain from left humerus Fx and would like a Rx.  Cb# [phone].  Please advise.  Thank you.

## 2020-09-02 ENCOUNTER — Telehealth: Payer: Self-pay

## 2020-09-02 ENCOUNTER — Inpatient Hospital Stay (HOSPITAL_BASED_OUTPATIENT_CLINIC_OR_DEPARTMENT_OTHER): Payer: Medicare PPO | Admitting: Hematology and Oncology

## 2020-09-02 ENCOUNTER — Other Ambulatory Visit: Payer: Self-pay

## 2020-09-02 ENCOUNTER — Inpatient Hospital Stay: Payer: Medicare PPO

## 2020-09-02 ENCOUNTER — Other Ambulatory Visit: Payer: Self-pay | Admitting: *Deleted

## 2020-09-02 ENCOUNTER — Telehealth: Payer: Self-pay | Admitting: Orthopaedic Surgery

## 2020-09-02 ENCOUNTER — Telehealth: Payer: Self-pay | Admitting: *Deleted

## 2020-09-02 ENCOUNTER — Encounter: Payer: Self-pay | Admitting: *Deleted

## 2020-09-02 ENCOUNTER — Inpatient Hospital Stay: Payer: Medicare PPO | Attending: Hematology and Oncology

## 2020-09-02 DIAGNOSIS — C50211 Malignant neoplasm of upper-inner quadrant of right female breast: Secondary | ICD-10-CM | POA: Diagnosis not present

## 2020-09-02 DIAGNOSIS — Z170 Estrogen receptor positive status [ER+]: Secondary | ICD-10-CM | POA: Diagnosis not present

## 2020-09-02 DIAGNOSIS — Z5112 Encounter for antineoplastic immunotherapy: Secondary | ICD-10-CM | POA: Insufficient documentation

## 2020-09-02 DIAGNOSIS — Z95828 Presence of other vascular implants and grafts: Secondary | ICD-10-CM

## 2020-09-02 LAB — CBC WITH DIFFERENTIAL (CANCER CENTER ONLY)
Abs Immature Granulocytes: 0.05 10*3/uL (ref 0.00–0.07)
Basophils Absolute: 0 10*3/uL (ref 0.0–0.1)
Basophils Relative: 0 %
Eosinophils Absolute: 0.2 10*3/uL (ref 0.0–0.5)
Eosinophils Relative: 2 %
HCT: 33.7 % — ABNORMAL LOW (ref 36.0–46.0)
Hemoglobin: 11 g/dL — ABNORMAL LOW (ref 12.0–15.0)
Immature Granulocytes: 1 %
Lymphocytes Relative: 13 %
Lymphs Abs: 1.1 10*3/uL (ref 0.7–4.0)
MCH: 30.8 pg (ref 26.0–34.0)
MCHC: 32.6 g/dL (ref 30.0–36.0)
MCV: 94.4 fL (ref 80.0–100.0)
Monocytes Absolute: 0.7 10*3/uL (ref 0.1–1.0)
Monocytes Relative: 8 %
Neutro Abs: 6.7 10*3/uL (ref 1.7–7.7)
Neutrophils Relative %: 76 %
Platelet Count: 286 10*3/uL (ref 150–400)
RBC: 3.57 MIL/uL — ABNORMAL LOW (ref 3.87–5.11)
RDW: 14.9 % (ref 11.5–15.5)
WBC Count: 8.8 10*3/uL (ref 4.0–10.5)
nRBC: 0 % (ref 0.0–0.2)

## 2020-09-02 LAB — CMP (CANCER CENTER ONLY)
ALT: 13 U/L (ref 0–44)
AST: 19 U/L (ref 15–41)
Albumin: 3.3 g/dL — ABNORMAL LOW (ref 3.5–5.0)
Alkaline Phosphatase: 139 U/L — ABNORMAL HIGH (ref 38–126)
Anion gap: 8 (ref 5–15)
BUN: 8 mg/dL (ref 8–23)
CO2: 28 mmol/L (ref 22–32)
Calcium: 9.1 mg/dL (ref 8.9–10.3)
Chloride: 102 mmol/L (ref 98–111)
Creatinine: 0.71 mg/dL (ref 0.44–1.00)
GFR, Est AFR Am: >60 mL/min (ref >60)
GFR, Estimated: >60 mL/min (ref >60)
Glucose, Bld: 94 mg/dL (ref 70–99)
Potassium: 3 mmol/L — CL (ref 3.5–5.1)
Sodium: 138 mmol/L (ref 135–145)
Total Bilirubin: 0.4 mg/dL (ref 0.3–1.2)
Total Protein: 6.5 g/dL (ref 6.5–8.1)

## 2020-09-02 MED ORDER — POTASSIUM CHLORIDE CRYS ER 20 MEQ PO TBCR: 20.0000 meq | EXTENDED_RELEASE_TABLET | Freq: Two times a day (BID) | ORAL | 1 refills | Status: DC

## 2020-09-02 MED ORDER — ACETAMINOPHEN 325 MG PO TABS
ORAL_TABLET | ORAL | Status: AC
  Filled 2020-09-02: qty 2

## 2020-09-02 MED ORDER — SODIUM CHLORIDE 0.9 % IV SOLN
Freq: Once | INTRAVENOUS | Status: AC
  Filled 2020-09-02: qty 250

## 2020-09-02 MED ORDER — DIPHENHYDRAMINE HCL 25 MG PO CAPS
50.0000 mg | ORAL_CAPSULE | Freq: Once | ORAL | Status: AC
  Administered 2020-09-02: 50 mg via ORAL

## 2020-09-02 MED ORDER — HEPARIN SOD (PORK) LOCK FLUSH 100 UNIT/ML IV SOLN
500.0000 [IU] | Freq: Once | INTRAVENOUS | Status: AC | PRN
  Administered 2020-09-02: 500 [IU]
  Filled 2020-09-02: qty 5

## 2020-09-02 MED ORDER — TRASTUZUMAB-ANNS CHEMO 150 MG IV SOLR
6.0000 mg/kg | Freq: Once | INTRAVENOUS | Status: AC
  Administered 2020-09-02: 546 mg via INTRAVENOUS
  Filled 2020-09-02: qty 26

## 2020-09-02 MED ORDER — SODIUM CHLORIDE 0.9% FLUSH
10.0000 mL | Freq: Once | INTRAVENOUS | Status: AC
  Administered 2020-09-02: 10 mL
  Filled 2020-09-02: qty 10

## 2020-09-02 MED ORDER — DIPHENHYDRAMINE HCL 25 MG PO CAPS
ORAL_CAPSULE | ORAL | Status: AC
  Filled 2020-09-02: qty 2

## 2020-09-02 MED ORDER — ACETAMINOPHEN 325 MG PO TABS
650.0000 mg | ORAL_TABLET | Freq: Once | ORAL | Status: AC
  Administered 2020-09-02: 650 mg via ORAL

## 2020-09-02 MED ORDER — SODIUM CHLORIDE 0.9% FLUSH
10.0000 mL | INTRAVENOUS | Status: DC | PRN
  Administered 2020-09-02: 10 mL
  Filled 2020-09-02: qty 10

## 2020-09-02 MED ORDER — OXYCODONE-ACETAMINOPHEN 5-325 MG PO TABS
2.0000 | ORAL_TABLET | Freq: Four times a day (QID) | ORAL | 0 refills | Status: DC | PRN
Start: 2020-09-02 — End: 2020-09-18

## 2020-09-02 NOTE — Telephone Encounter (Signed)
I sent in some more.  30 tablets

## 2020-09-02 NOTE — Progress Notes (Signed)
Ok to use today's weight for Trastuzumab dose per Dr. Lindi Adie.  Kennith Center, Pharm.D., CPP 09/02/2020@3 :01 PM

## 2020-09-02 NOTE — Telephone Encounter (Signed)
Potassium 3.0. Potassium supplement sent to pt pharmacy on file. Infusion nurse made pt aware.

## 2020-09-02 NOTE — Telephone Encounter (Signed)
Pt would like a CB in regards to medicine that the pt states they discussed yesterday.   [phone]

## 2020-09-02 NOTE — Assessment & Plan Note (Signed)
01/22/2020: Right medial lumpectomy: Grade 3 IDC 2.3 cm with high-grade DCIS with necrosis, margins negative, negative for lymphovascular or perineural invasion, Right lateral lumpectomy: Isolated foci of DCIS high-grade, resection margins negative 4 lymph nodes negative, ER 90%, PR 30%, HER-2 3+ positive, Ki-67 30%  Treatment plan: 1.Adjuvant chemotherapy with Taxol Herceptin weekly x9(stopped early because of lower extremity edema)followed by Herceptin maintenance 2.Adjuvant radiation therapystarted 05/29/2020 3.Followed by adjuvant antiestrogen therapy. ----------------------------------------------------------------------------------------------------------------------------------------------------------- Current treatment:Herceptin maintenance and adjuvant radiation Echocardiogram 02/04/2020: EF 60 to 65%  Chemo toxicities: 1.Moderate to severe fatigue:Due to Taxol chemo 2.Lower extremity edema leftleg.Required hospitalization for cellulitis treated with IV antibiotics. This continues to be swollen but under stable condition. I sent a new prescription for Lasix along with the doxycycline today. 3.Major depression: Currently on Prozac. 4.Diarrhea: Due to Herceptin.   5.Pain issues: Discontinue tramadol and sent her prescription for Percocets 2.5 mg.  Extensive bruising on the face related to recent fall Leg pain:Discontinued Dilaudid and tramadol and prescribed her Percocets. Hypokalemia:On oral potassium daily.  Return to clinicevery 3 weeks for Herceptin maintenanceand every 6 weeks to follow-up with me.Marland Kitchen

## 2020-09-02 NOTE — Telephone Encounter (Signed)
Patient aware this was called in for her Also to take SPARINGLY

## 2020-09-02 NOTE — Telephone Encounter (Signed)
Wants refill of pain med

## 2020-09-02 NOTE — Patient Instructions (Signed)
[facility] °Discharge Instructions for Patients Receiving Chemotherapy ° °Today you received the following chemotherapy agents Trastuzumab ° °To help prevent nausea and vomiting after your treatment, we encourage you to take your nausea medication as directed. °  °If you develop nausea and vomiting that is not controlled by your nausea medication, call the clinic.  ° °BELOW ARE SYMPTOMS THAT SHOULD BE REPORTED IMMEDIATELY: °· *FEVER GREATER THAN 100.5 F °· *CHILLS WITH OR WITHOUT FEVER °· NAUSEA AND VOMITING THAT IS NOT CONTROLLED WITH YOUR NAUSEA MEDICATION °· *UNUSUAL SHORTNESS OF BREATH °· *UNUSUAL BRUISING OR BLEEDING °· TENDERNESS IN MOUTH AND THROAT WITH OR WITHOUT PRESENCE OF ULCERS °· *URINARY PROBLEMS °· *BOWEL PROBLEMS °· UNUSUAL RASH °Items with * indicate a potential emergency and should be followed up as soon as possible. ° °Feel free to call the clinic should you have any questions or concerns. The clinic phone number is [phone]. ° °Please show the CHEMO ALERT CARD at check-in to the Emergency Department and triage nurse. ° ° °

## 2020-09-03 ENCOUNTER — Telehealth: Payer: Self-pay | Admitting: Hematology and Oncology

## 2020-09-03 NOTE — Telephone Encounter (Signed)
Schedule per 9/14 los. Called and spoke with pt, confirmed added appts. Pt will receive an updated calendar printout

## 2020-09-04 DIAGNOSIS — S42202D Unspecified fracture of upper end of left humerus, subsequent encounter for fracture with routine healing: Secondary | ICD-10-CM | POA: Diagnosis not present

## 2020-09-04 DIAGNOSIS — D539 Nutritional anemia, unspecified: Secondary | ICD-10-CM | POA: Diagnosis not present

## 2020-09-04 DIAGNOSIS — C50211 Malignant neoplasm of upper-inner quadrant of right female breast: Secondary | ICD-10-CM | POA: Diagnosis not present

## 2020-09-04 DIAGNOSIS — E039 Hypothyroidism, unspecified: Secondary | ICD-10-CM | POA: Diagnosis not present

## 2020-09-04 DIAGNOSIS — I10 Essential (primary) hypertension: Secondary | ICD-10-CM | POA: Diagnosis not present

## 2020-09-04 DIAGNOSIS — F419 Anxiety disorder, unspecified: Secondary | ICD-10-CM | POA: Diagnosis not present

## 2020-09-04 DIAGNOSIS — G8929 Other chronic pain: Secondary | ICD-10-CM | POA: Diagnosis not present

## 2020-09-04 DIAGNOSIS — M4800 Spinal stenosis, site unspecified: Secondary | ICD-10-CM | POA: Diagnosis not present

## 2020-09-04 DIAGNOSIS — F329 Major depressive disorder, single episode, unspecified: Secondary | ICD-10-CM | POA: Diagnosis not present

## 2020-09-06 DIAGNOSIS — S42202D Unspecified fracture of upper end of left humerus, subsequent encounter for fracture with routine healing: Secondary | ICD-10-CM | POA: Diagnosis not present

## 2020-09-06 DIAGNOSIS — F419 Anxiety disorder, unspecified: Secondary | ICD-10-CM | POA: Diagnosis not present

## 2020-09-06 DIAGNOSIS — D539 Nutritional anemia, unspecified: Secondary | ICD-10-CM | POA: Diagnosis not present

## 2020-09-06 DIAGNOSIS — E039 Hypothyroidism, unspecified: Secondary | ICD-10-CM | POA: Diagnosis not present

## 2020-09-06 DIAGNOSIS — C50211 Malignant neoplasm of upper-inner quadrant of right female breast: Secondary | ICD-10-CM | POA: Diagnosis not present

## 2020-09-06 DIAGNOSIS — I10 Essential (primary) hypertension: Secondary | ICD-10-CM | POA: Diagnosis not present

## 2020-09-06 DIAGNOSIS — M4800 Spinal stenosis, site unspecified: Secondary | ICD-10-CM | POA: Diagnosis not present

## 2020-09-06 DIAGNOSIS — F329 Major depressive disorder, single episode, unspecified: Secondary | ICD-10-CM | POA: Diagnosis not present

## 2020-09-06 DIAGNOSIS — G8929 Other chronic pain: Secondary | ICD-10-CM | POA: Diagnosis not present

## 2020-09-09 DIAGNOSIS — D539 Nutritional anemia, unspecified: Secondary | ICD-10-CM | POA: Diagnosis not present

## 2020-09-09 DIAGNOSIS — S42202D Unspecified fracture of upper end of left humerus, subsequent encounter for fracture with routine healing: Secondary | ICD-10-CM | POA: Diagnosis not present

## 2020-09-09 DIAGNOSIS — C50211 Malignant neoplasm of upper-inner quadrant of right female breast: Secondary | ICD-10-CM | POA: Diagnosis not present

## 2020-09-09 DIAGNOSIS — I10 Essential (primary) hypertension: Secondary | ICD-10-CM | POA: Diagnosis not present

## 2020-09-09 DIAGNOSIS — M4800 Spinal stenosis, site unspecified: Secondary | ICD-10-CM | POA: Diagnosis not present

## 2020-09-09 DIAGNOSIS — F329 Major depressive disorder, single episode, unspecified: Secondary | ICD-10-CM | POA: Diagnosis not present

## 2020-09-09 DIAGNOSIS — G8929 Other chronic pain: Secondary | ICD-10-CM | POA: Diagnosis not present

## 2020-09-09 DIAGNOSIS — F419 Anxiety disorder, unspecified: Secondary | ICD-10-CM | POA: Diagnosis not present

## 2020-09-09 DIAGNOSIS — E039 Hypothyroidism, unspecified: Secondary | ICD-10-CM | POA: Diagnosis not present

## 2020-09-10 ENCOUNTER — Ambulatory Visit (INDEPENDENT_AMBULATORY_CARE_PROVIDER_SITE_OTHER): Payer: Medicare PPO | Admitting: Orthopaedic Surgery

## 2020-09-10 ENCOUNTER — Ambulatory Visit: Payer: Self-pay

## 2020-09-10 DIAGNOSIS — D539 Nutritional anemia, unspecified: Secondary | ICD-10-CM | POA: Diagnosis not present

## 2020-09-10 DIAGNOSIS — F329 Major depressive disorder, single episode, unspecified: Secondary | ICD-10-CM | POA: Diagnosis not present

## 2020-09-10 DIAGNOSIS — C50211 Malignant neoplasm of upper-inner quadrant of right female breast: Secondary | ICD-10-CM | POA: Diagnosis not present

## 2020-09-10 DIAGNOSIS — S42292A Other displaced fracture of upper end of left humerus, initial encounter for closed fracture: Secondary | ICD-10-CM

## 2020-09-10 DIAGNOSIS — F419 Anxiety disorder, unspecified: Secondary | ICD-10-CM | POA: Diagnosis not present

## 2020-09-10 DIAGNOSIS — G8929 Other chronic pain: Secondary | ICD-10-CM | POA: Diagnosis not present

## 2020-09-10 DIAGNOSIS — S42202D Unspecified fracture of upper end of left humerus, subsequent encounter for fracture with routine healing: Secondary | ICD-10-CM | POA: Diagnosis not present

## 2020-09-10 DIAGNOSIS — M4800 Spinal stenosis, site unspecified: Secondary | ICD-10-CM | POA: Diagnosis not present

## 2020-09-10 DIAGNOSIS — E039 Hypothyroidism, unspecified: Secondary | ICD-10-CM | POA: Diagnosis not present

## 2020-09-10 DIAGNOSIS — I10 Essential (primary) hypertension: Secondary | ICD-10-CM | POA: Diagnosis not present

## 2020-09-10 MED ORDER — IBUPROFEN 800 MG PO TABS: 800.0000 mg | ORAL_TABLET | Freq: Three times a day (TID) | ORAL | 0 refills | Status: DC | PRN

## 2020-09-10 MED ORDER — HYDROCODONE-ACETAMINOPHEN 7.5-325 MG PO TABS: 1.0000 | ORAL_TABLET | Freq: Four times a day (QID) | ORAL | 0 refills | Status: DC | PRN

## 2020-09-10 NOTE — Progress Notes (Signed)
The patient is getting close to 4 weeks out from a comminuted proximal humerus fracture of the left shoulder.  She is still struggling with her sling and having significant unrelenting pain with that shoulder.  She also has a history of malignant breast cancer in the past.  She is struggling with the pain with her shoulder.  She has been on oxycodone and Robaxin as well as ibuprofen.  On examination of her left shoulder, it is clinically well located but still very tender when I just even touch her skin.  2 views left shoulder show that it is a comminuted proximal humerus fracture but there is some interval signs of healing.  The shoulder is located.  At this point I have her stay out of her sling since she is more comfortable out of it.  We will switch her from oxycodone to 7.5 mg hydrocodone as well as 800 mg ibuprofen and continue her Robaxin.  I would like to see her back in 4 weeks.  At that visit I would like 3 views of the left shoulder.

## 2020-09-11 DIAGNOSIS — F419 Anxiety disorder, unspecified: Secondary | ICD-10-CM | POA: Diagnosis not present

## 2020-09-11 DIAGNOSIS — M4800 Spinal stenosis, site unspecified: Secondary | ICD-10-CM | POA: Diagnosis not present

## 2020-09-11 DIAGNOSIS — I10 Essential (primary) hypertension: Secondary | ICD-10-CM | POA: Diagnosis not present

## 2020-09-11 DIAGNOSIS — S42202D Unspecified fracture of upper end of left humerus, subsequent encounter for fracture with routine healing: Secondary | ICD-10-CM | POA: Diagnosis not present

## 2020-09-11 DIAGNOSIS — G8929 Other chronic pain: Secondary | ICD-10-CM | POA: Diagnosis not present

## 2020-09-11 DIAGNOSIS — C50211 Malignant neoplasm of upper-inner quadrant of right female breast: Secondary | ICD-10-CM | POA: Diagnosis not present

## 2020-09-11 DIAGNOSIS — E039 Hypothyroidism, unspecified: Secondary | ICD-10-CM | POA: Diagnosis not present

## 2020-09-11 DIAGNOSIS — S42309D Unspecified fracture of shaft of humerus, unspecified arm, subsequent encounter for fracture with routine healing: Secondary | ICD-10-CM | POA: Diagnosis not present

## 2020-09-11 DIAGNOSIS — Z23 Encounter for immunization: Secondary | ICD-10-CM | POA: Diagnosis not present

## 2020-09-11 DIAGNOSIS — D539 Nutritional anemia, unspecified: Secondary | ICD-10-CM | POA: Diagnosis not present

## 2020-09-11 DIAGNOSIS — F329 Major depressive disorder, single episode, unspecified: Secondary | ICD-10-CM | POA: Diagnosis not present

## 2020-09-16 DIAGNOSIS — C50211 Malignant neoplasm of upper-inner quadrant of right female breast: Secondary | ICD-10-CM | POA: Diagnosis not present

## 2020-09-16 DIAGNOSIS — D539 Nutritional anemia, unspecified: Secondary | ICD-10-CM | POA: Diagnosis not present

## 2020-09-16 DIAGNOSIS — E039 Hypothyroidism, unspecified: Secondary | ICD-10-CM | POA: Diagnosis not present

## 2020-09-16 DIAGNOSIS — F329 Major depressive disorder, single episode, unspecified: Secondary | ICD-10-CM | POA: Diagnosis not present

## 2020-09-16 DIAGNOSIS — S42202D Unspecified fracture of upper end of left humerus, subsequent encounter for fracture with routine healing: Secondary | ICD-10-CM | POA: Diagnosis not present

## 2020-09-16 DIAGNOSIS — G8929 Other chronic pain: Secondary | ICD-10-CM | POA: Diagnosis not present

## 2020-09-16 DIAGNOSIS — F419 Anxiety disorder, unspecified: Secondary | ICD-10-CM | POA: Diagnosis not present

## 2020-09-16 DIAGNOSIS — M4800 Spinal stenosis, site unspecified: Secondary | ICD-10-CM | POA: Diagnosis not present

## 2020-09-16 DIAGNOSIS — I10 Essential (primary) hypertension: Secondary | ICD-10-CM | POA: Diagnosis not present

## 2020-09-17 DIAGNOSIS — G8929 Other chronic pain: Secondary | ICD-10-CM | POA: Diagnosis not present

## 2020-09-17 DIAGNOSIS — C50211 Malignant neoplasm of upper-inner quadrant of right female breast: Secondary | ICD-10-CM | POA: Diagnosis not present

## 2020-09-17 DIAGNOSIS — D539 Nutritional anemia, unspecified: Secondary | ICD-10-CM | POA: Diagnosis not present

## 2020-09-17 DIAGNOSIS — M4800 Spinal stenosis, site unspecified: Secondary | ICD-10-CM | POA: Diagnosis not present

## 2020-09-17 DIAGNOSIS — F329 Major depressive disorder, single episode, unspecified: Secondary | ICD-10-CM | POA: Diagnosis not present

## 2020-09-17 DIAGNOSIS — E039 Hypothyroidism, unspecified: Secondary | ICD-10-CM | POA: Diagnosis not present

## 2020-09-17 DIAGNOSIS — I10 Essential (primary) hypertension: Secondary | ICD-10-CM | POA: Diagnosis not present

## 2020-09-17 DIAGNOSIS — S42202D Unspecified fracture of upper end of left humerus, subsequent encounter for fracture with routine healing: Secondary | ICD-10-CM | POA: Diagnosis not present

## 2020-09-17 DIAGNOSIS — F419 Anxiety disorder, unspecified: Secondary | ICD-10-CM | POA: Diagnosis not present

## 2020-09-18 ENCOUNTER — Other Ambulatory Visit: Payer: Self-pay

## 2020-09-18 ENCOUNTER — Ambulatory Visit: Payer: Self-pay

## 2020-09-18 ENCOUNTER — Ambulatory Visit (INDEPENDENT_AMBULATORY_CARE_PROVIDER_SITE_OTHER): Payer: Medicare PPO | Admitting: Physician Assistant

## 2020-09-18 ENCOUNTER — Encounter: Payer: Self-pay | Admitting: Physician Assistant

## 2020-09-18 ENCOUNTER — Other Ambulatory Visit: Payer: Self-pay | Admitting: Physician Assistant

## 2020-09-18 DIAGNOSIS — M25552 Pain in left hip: Secondary | ICD-10-CM

## 2020-09-18 DIAGNOSIS — M79605 Pain in left leg: Secondary | ICD-10-CM

## 2020-09-18 MED ORDER — HYDROMORPHONE HCL 2 MG PO TABS: 2.0000 mg | ORAL_TABLET | ORAL | 0 refills | Status: DC | PRN

## 2020-09-18 NOTE — Progress Notes (Signed)
Office Visit Note   Patient: [name]           Date of Birth: Jul 13, 1951           MRN: [id number] Visit Date: 09/18/2020              Requested by: Maurice Small, MD [place],  [place] [address] PCP: Maurice Small, MD   Assessment & Plan: Visit Diagnoses:  1. Pain in left hip     Plan: We will have her nonweightbearing on the the left leg except for transfers and she is to use a walker to transfer.  Encouraged her to wiggle her toes often some move her ankle and knee often.  Will obtain a left hip CT scan to rule out femoral neck fracture urgently.  Call her with the results.  Questions were encouraged and answered.  Did send in some Dilaudid for her for her increased pain and discussed with her if she is going to have to be on pain medicines long-term that would have sent her back to pain management as she is seen Dr. Maryjean Ka in the past.  Follow-Up Instructions: Return CT Scan left hip.   Orders:  Orders Placed This Encounter  Procedures  . XR HIP UNILAT W OR W/O PELVIS 2-3 VIEWS LEFT   Meds ordered this encounter  Medications  . HYDROmorphone (DILAUDID) 2 MG tablet    Sig: Take 1 tablet (2 mg total) by mouth every 4 (four) hours as needed for severe pain.    Dispense:  25 tablet    Refill:  0      Procedures: No procedures performed   Clinical Data: No additional findings.   Subjective: Chief Complaint  Patient presents with  . Left Hip - Pain    HPI Mrs. [name] comes in today with left leg pain that started 5 days ago after doing some exercises with the physical therapist.  She states that she has had no fall no injury otherwise.  She is having severe pain in the left hip region.  She states she is unable to bear weight due to the pain.  He is using a walker with some difficulty due to her comminuted proximal humerus fracture on the left side.  She states that Percocet is giving her no relief.  Patient is currently  undergoing chemotherapy for breast cancer.  Review of Systems   Objective: Vital Signs: LMP  (LMP Unknown)   Physical Exam Constitutional:      Appearance: She is not ill-appearing or diaphoretic.  Pulmonary:     Effort: Pulmonary effort is normal.  Neurological:     Mental Status: She is alert and oriented to person, place, and time.  Psychiatric:        Mood and Affect: Mood normal.     Ortho Exam Left calf supple nontender.  No significant tenderness that I reviewed the trochanteric region.  She does have tenderness over the anterior aspect of her left hip/groin area.  Attempts at range of motion of the hip causes severe pain. Specialty Comments:  No specialty comments available.  Imaging: XR HIP UNILAT W OR W/O PELVIS 2-3 VIEWS LEFT  Result Date: 09/18/2020 Left hip lateral view and AP pelvis shows bilateral hips to be well located.  There is any concerning for early callus formation in the femoral neck region that could indicate a nondisplaced hip fracture.  Otherwise no bony abnormalities.    PMFS History: Patient Active  Problem List   Diagnosis Date Noted  . Port-A-Cath in place 07/22/2020  . Left leg cellulitis 04/17/2020  . Cellulitis of left leg 04/17/2020  . Hypokalemia 04/17/2020  . Macrocytic anemia 04/17/2020  . Anxiety 04/17/2020  . Depression 04/17/2020  . Chronic diarrhea 04/17/2020  . Chemotherapy induced diarrhea 04/17/2020  . Malignant neoplasm of upper-inner quadrant of right breast in female, estrogen receptor positive ([facility]) 12/26/2019  . Encephalopathy 02/02/2018  . Hypothyroidism 02/02/2018  . AKI (acute kidney injury) ([facility]) 02/02/2018  . Chronic back pain 02/02/2018  . Alcohol abuse 02/02/2018  . Weight gain 02/02/2018  . Benign essential HTN 02/02/2018   Past Medical History:  Diagnosis Date  . Anxiety   . Cancer (HCC)    breast  . Chronic pain   . Concussion 3/08   ICU x 3 days  . Depression   . Hypertension   . Hypothyroidism    . Spinal stenosis   . Thyroid disease ?1994    Family History  Problem Relation Age of Onset  . Thyroid disease Mother   . Dementia Mother   . Diabetes Father   . Stroke Father   . Hypertension Sister   . Diabetes Sister     Past Surgical History:  Procedure Laterality Date  . BREAST LUMPECTOMY WITH RADIOACTIVE SEED AND SENTINEL LYMPH NODE BIOPSY Right 01/22/2020   Procedure: RIGHT BREAST LUMPECTOMY WITH RADIOACTIVE SEED X2 AND RIGHT SENTINEL LYMPH NODE MAPPING;  Surgeon: Erroll Luna, MD;  Location: [facility];  Service: General;  Laterality: Right;  . BREAST SURGERY Right 4/99   breast biopsy, benign  . PORTACATH PLACEMENT Right 01/22/2020   Procedure: INSERTION PORT-A-CATH WITH ULTRASOUND;  Surgeon: Erroll Luna, MD;  Location: [facility];  Service: General;  Laterality: Right;  . PORTACATH PLACEMENT Right 02/28/2020   Procedure: PORT A CATH REVISION;  Surgeon: Erroll Luna, MD;  Location: [facility];  Service: General;  Laterality: Right;  . SPINAL FUSION  03/04/11   with ORIF   Social History   Occupational History  . Not on file  Tobacco Use  . Smoking status: Former Smoker    Types: Cigarettes  . Smokeless tobacco: Never Used  Vaping Use  . Vaping Use: Never used  Substance and Sexual Activity  . Alcohol use: Yes    Alcohol/week: 5.0 standard drinks    Types: 5 Standard drinks or equivalent per week    Comment: wine  . Drug use: No  . Sexual activity: Never    Partners: Male    Birth control/protection: Post-menopausal

## 2020-09-19 DIAGNOSIS — D539 Nutritional anemia, unspecified: Secondary | ICD-10-CM | POA: Diagnosis not present

## 2020-09-19 DIAGNOSIS — I10 Essential (primary) hypertension: Secondary | ICD-10-CM | POA: Diagnosis not present

## 2020-09-19 DIAGNOSIS — F329 Major depressive disorder, single episode, unspecified: Secondary | ICD-10-CM | POA: Diagnosis not present

## 2020-09-19 DIAGNOSIS — F419 Anxiety disorder, unspecified: Secondary | ICD-10-CM | POA: Diagnosis not present

## 2020-09-19 DIAGNOSIS — S42202D Unspecified fracture of upper end of left humerus, subsequent encounter for fracture with routine healing: Secondary | ICD-10-CM | POA: Diagnosis not present

## 2020-09-19 DIAGNOSIS — M4800 Spinal stenosis, site unspecified: Secondary | ICD-10-CM | POA: Diagnosis not present

## 2020-09-19 DIAGNOSIS — C50211 Malignant neoplasm of upper-inner quadrant of right female breast: Secondary | ICD-10-CM | POA: Diagnosis not present

## 2020-09-19 DIAGNOSIS — G8929 Other chronic pain: Secondary | ICD-10-CM | POA: Diagnosis not present

## 2020-09-19 DIAGNOSIS — E039 Hypothyroidism, unspecified: Secondary | ICD-10-CM | POA: Diagnosis not present

## 2020-09-22 ENCOUNTER — Encounter: Payer: Self-pay | Admitting: *Deleted

## 2020-09-23 ENCOUNTER — Inpatient Hospital Stay: Payer: Medicare PPO | Admitting: Internal Medicine

## 2020-09-23 ENCOUNTER — Other Ambulatory Visit: Payer: Medicare PPO

## 2020-09-23 ENCOUNTER — Inpatient Hospital Stay: Payer: Medicare PPO | Attending: Hematology and Oncology

## 2020-09-23 ENCOUNTER — Telehealth: Payer: Self-pay | Admitting: Orthopaedic Surgery

## 2020-09-23 ENCOUNTER — Inpatient Hospital Stay (HOSPITAL_BASED_OUTPATIENT_CLINIC_OR_DEPARTMENT_OTHER): Payer: Medicare PPO | Admitting: Medical

## 2020-09-23 ENCOUNTER — Other Ambulatory Visit: Payer: Self-pay

## 2020-09-23 ENCOUNTER — Other Ambulatory Visit: Payer: Self-pay | Admitting: Hematology and Oncology

## 2020-09-23 ENCOUNTER — Other Ambulatory Visit: Payer: Self-pay | Admitting: Medical

## 2020-09-23 VITALS — BP 122/75 | HR 79 | Temp 98.9°F | Resp 16

## 2020-09-23 DIAGNOSIS — C50211 Malignant neoplasm of upper-inner quadrant of right female breast: Secondary | ICD-10-CM | POA: Insufficient documentation

## 2020-09-23 DIAGNOSIS — E039 Hypothyroidism, unspecified: Secondary | ICD-10-CM | POA: Diagnosis not present

## 2020-09-23 DIAGNOSIS — D539 Nutritional anemia, unspecified: Secondary | ICD-10-CM | POA: Diagnosis not present

## 2020-09-23 DIAGNOSIS — F419 Anxiety disorder, unspecified: Secondary | ICD-10-CM | POA: Diagnosis not present

## 2020-09-23 DIAGNOSIS — M4800 Spinal stenosis, site unspecified: Secondary | ICD-10-CM | POA: Diagnosis not present

## 2020-09-23 DIAGNOSIS — G8929 Other chronic pain: Secondary | ICD-10-CM | POA: Diagnosis not present

## 2020-09-23 DIAGNOSIS — Z170 Estrogen receptor positive status [ER+]: Secondary | ICD-10-CM | POA: Insufficient documentation

## 2020-09-23 DIAGNOSIS — F329 Major depressive disorder, single episode, unspecified: Secondary | ICD-10-CM | POA: Diagnosis not present

## 2020-09-23 DIAGNOSIS — Z5112 Encounter for antineoplastic immunotherapy: Secondary | ICD-10-CM | POA: Insufficient documentation

## 2020-09-23 DIAGNOSIS — M25552 Pain in left hip: Secondary | ICD-10-CM | POA: Insufficient documentation

## 2020-09-23 DIAGNOSIS — M549 Dorsalgia, unspecified: Secondary | ICD-10-CM | POA: Diagnosis not present

## 2020-09-23 DIAGNOSIS — S72002S Fracture of unspecified part of neck of left femur, sequela: Secondary | ICD-10-CM

## 2020-09-23 DIAGNOSIS — I10 Essential (primary) hypertension: Secondary | ICD-10-CM | POA: Diagnosis not present

## 2020-09-23 DIAGNOSIS — M545 Low back pain, unspecified: Secondary | ICD-10-CM

## 2020-09-23 DIAGNOSIS — S42202D Unspecified fracture of upper end of left humerus, subsequent encounter for fracture with routine healing: Secondary | ICD-10-CM | POA: Diagnosis not present

## 2020-09-23 MED ORDER — MORPHINE SULFATE (PF) 2 MG/ML IV SOLN: 2.0000 mg | Freq: Once | INTRAVENOUS | Status: DC

## 2020-09-23 MED ORDER — ACETAMINOPHEN 325 MG PO TABS
ORAL_TABLET | ORAL | Status: AC
  Filled 2020-09-23: qty 2

## 2020-09-23 MED ORDER — HEPARIN SOD (PORK) LOCK FLUSH 100 UNIT/ML IV SOLN
500.0000 [IU] | Freq: Once | INTRAVENOUS | Status: AC | PRN
  Administered 2020-09-23: 500 [IU]
  Filled 2020-09-23: qty 5

## 2020-09-23 MED ORDER — SODIUM CHLORIDE 0.9% FLUSH
10.0000 mL | INTRAVENOUS | Status: DC | PRN
  Administered 2020-09-23: 10 mL
  Filled 2020-09-23: qty 10

## 2020-09-23 MED ORDER — TRASTUZUMAB-ANNS CHEMO 150 MG IV SOLR
6.0000 mg/kg | Freq: Once | INTRAVENOUS | Status: AC
  Administered 2020-09-23: 546 mg via INTRAVENOUS
  Filled 2020-09-23: qty 26

## 2020-09-23 MED ORDER — MORPHINE SULFATE (PF) 2 MG/ML IV SOLN
INTRAVENOUS | Status: AC
  Filled 2020-09-23: qty 1

## 2020-09-23 MED ORDER — DIPHENHYDRAMINE HCL 25 MG PO CAPS
50.0000 mg | ORAL_CAPSULE | Freq: Once | ORAL | Status: AC
  Administered 2020-09-23: 50 mg via ORAL

## 2020-09-23 MED ORDER — MORPHINE SULFATE (PF) 2 MG/ML IV SOLN
2.0000 mg | Freq: Once | INTRAVENOUS | Status: AC
  Administered 2020-09-23: 2 mg via INTRAVENOUS

## 2020-09-23 MED ORDER — ACETAMINOPHEN 325 MG PO TABS
650.0000 mg | ORAL_TABLET | Freq: Once | ORAL | Status: AC
  Administered 2020-09-23: 650 mg via ORAL

## 2020-09-23 MED ORDER — DIPHENHYDRAMINE HCL 25 MG PO CAPS
ORAL_CAPSULE | ORAL | Status: AC
  Filled 2020-09-23: qty 2

## 2020-09-23 MED ORDER — SODIUM CHLORIDE 0.9 % IV SOLN
Freq: Once | INTRAVENOUS | Status: AC
  Filled 2020-09-23: qty 250

## 2020-09-23 NOTE — Patient Instructions (Signed)
[facility] Discharge Instructions for Patients Receiving Chemotherapy  Today you received the following chemotherapy agents: Herceptin  To help prevent nausea and vomiting after your treatment, we encourage you to take your nausea medication  as prescribed.    If you develop nausea and vomiting that is not controlled by your nausea medication, call the clinic.   BELOW ARE SYMPTOMS THAT SHOULD BE REPORTED IMMEDIATELY:  *FEVER GREATER THAN 100.5 F  *CHILLS WITH OR WITHOUT FEVER  NAUSEA AND VOMITING THAT IS NOT CONTROLLED WITH YOUR NAUSEA MEDICATION  *UNUSUAL SHORTNESS OF BREATH  *UNUSUAL BRUISING OR BLEEDING  TENDERNESS IN MOUTH AND THROAT WITH OR WITHOUT PRESENCE OF ULCERS  *URINARY PROBLEMS  *BOWEL PROBLEMS  UNUSUAL RASH Items with * indicate a potential emergency and should be followed up as soon as possible.  Feel free to call the clinic should you have any questions or concerns. The clinic phone number is [phone].  Please show the [facility] at check-in to the Emergency Department and triage nurse.    Hip Fracture  A hip fracture is a break in the upper part of the thigh bone (femur). This is usually the result of an injury, commonly a fall. What are the causes? This condition may be caused by:  A direct hit or injury (trauma) to the side of the hip, such as from a fall or a car accident. What increases the risk? You are more likely to develop this condition if:  You have poor balance or an unsteady walking pattern (gait). Certain conditions contribute to poor balance, including Parkinson disease and dementia.  You have thinning or weakening of your bones, such as from osteopenia or osteoporosis.  You have cancer that spreads to the leg bones.  You have certain conditions that can weaken your bones, such as thyroid disorders, intestine disorders, or a lack (deficiency) of certain nutrients.  You smoke.  You take certain  medicines, such as steroids.  You have a history of broken bones. What are the signs or symptoms? Symptoms of this condition include:  Pain over the injured hip. This is commonly felt on the side of the hip or in the front groin area.  Stiffness, bruising, and swelling over the hip.  Pain with movement of the leg, especially lifting it up. Pain often gets better with rest.  Difficulty or inability to stand, walk, or use the leg to support body weight (put weight on the leg).  The leg rolling outward when lying down.  The affected leg being shorter than the other leg. How is this diagnosed? This condition may be diagnosed based on:  Your symptoms.  A physical exam.  X-rays. These may be done: ? To confirm the diagnosis. ? To determine the type and location of the fracture. ? To check for other injuries.  MRI or CT scans. These may be done if the fracture is not visible on an X-ray. How is this treated? Treatment for this condition depends on the severity and location of your fracture. In most cases, surgery is necessary. Surgery may involve:  Repairing the fracture with a screw, nail, or rod to hold the bone in place (open reduction and internal fixation, ORIF).  Replacing the damaged parts of the femur with metal implants (hemiarthroplasty or arthroplasty). If your fracture is less severe, or if you are not eligible for surgery, you may have non-surgical treatment. Non-surgical treatment may involve:  Using crutches, a walker, or a wheelchair until your health  care provider says that you can support (bear) weight on your hip.  Medicines to help reduce pain and swelling.  Having regular X-rays to monitor your fracture and make sure that it is healing.  Physical therapy. You may need physical therapy after surgery, too. Follow these instructions at home: Activity  Do not use your injured leg to support your body weight until your health care provider says that you  can. ? Follow standing and walking restrictions as told by your health care provider. ? Use crutches, a walker, or a wheelchair as directed.  Avoid any activities that cause pain or irritation in your hip. Ask your health care provider what activities are safe for you.  Do not drive or use heavy machinery until your health care provider approves.  If physical therapy was prescribed, do exercises as told by your health care provider. General instructions  Take over-the-counter and prescription medicines only as told by your health care provider.  If directed, put ice on the injured area: ? Put ice in a plastic bag. ? Place a towel between your skin and the bag. ? Leave the ice on for 20 minutes, 2-3 times a day.  Do not use any products that contain nicotine or tobacco, such as cigarettes and e-cigarettes. These can delay bone healing. If you need help quitting, ask your health care provider.  Keep all follow-up visits as told by your health care provider. This is important. How is this prevented?  To prevent falls at home: ? Use a cane, walker, or wheelchair as directed. ? Make sure your rooms and hallways are free of clutter, obstacles, and cords. ? Install grab bars in your bedroom and bathrooms. ? Always use handrails when going up and down stairs. ? Use nightlights around the house.  Exercise regularly. Ask what forms of exercise are safe for you, such as walking and strength and balance exercises.  Visit an eye doctor regularly to have your eyesight checked. This can help prevent falls.  Make sure you get enough calcium and vitamin D.  Do not use any products that contain nicotine or tobacco, such as cigarettes and e-cigarettes. If you need help quitting, ask your health care provider.  Limit alcohol use.  If you have an underlying condition that caused your hip fracture, work with your health care provider to manage your condition. Contact a health care provider  if:  Your pain gets worse or it does not get better with rest or medicine.  You develop any of the following in your leg or foot: ? Numbness. ? Tingling. ? A change in skin color (discoloration). ? Skin feeling cold to the touch. Get help right away if:  Your pain suddenly gets worse.  You cannot move your hip. Summary  A hip fracture is a break in the upper part of the thigh bone (femur).  Treatment typically require surgical management to restore stability and function to the hip.  Pain medicine and icing of the affected leg can help manage pain and swelling. Follow directions as told by your health care provider. This information is not intended to replace advice given to you by your health care provider. Make sure you discuss any questions you have with your health care provider. Document Revised: 08/26/2018 Document Reviewed: 01/08/2017 Elsevier Patient Education  2020 [employer].

## 2020-09-23 NOTE — Progress Notes (Signed)
Patient c/o of severe pain r/t left hip and shoulder fracture. Sandi Mealy, PA-C notified. Received orders for morphine to help alleviate pain. Hot packs also utilized for pain relief. Patient later noted that the pain felt "much better". Patient reminded to follow-up with orthopedic MD for further care.

## 2020-09-23 NOTE — Telephone Encounter (Signed)
Flor with [facility] would like to know if PT has to be put off due to the pt being non-weight bearing on her left extremities and would like a CB to further discuss  [phone]

## 2020-09-24 NOTE — Telephone Encounter (Signed)
[name] saw her this week and recommended nonweightbearing while we await a CT scan of the hip to make sure there is not a fracture.  So she will need to remain nonweightbearing until we see the results of the CT scan of her hip.

## 2020-09-24 NOTE — Telephone Encounter (Signed)
Verbal order given  

## 2020-09-25 DIAGNOSIS — I10 Essential (primary) hypertension: Secondary | ICD-10-CM | POA: Diagnosis not present

## 2020-09-25 DIAGNOSIS — M4800 Spinal stenosis, site unspecified: Secondary | ICD-10-CM | POA: Diagnosis not present

## 2020-09-25 DIAGNOSIS — E039 Hypothyroidism, unspecified: Secondary | ICD-10-CM | POA: Diagnosis not present

## 2020-09-25 DIAGNOSIS — S42202D Unspecified fracture of upper end of left humerus, subsequent encounter for fracture with routine healing: Secondary | ICD-10-CM | POA: Diagnosis not present

## 2020-09-25 DIAGNOSIS — F419 Anxiety disorder, unspecified: Secondary | ICD-10-CM | POA: Diagnosis not present

## 2020-09-25 DIAGNOSIS — D539 Nutritional anemia, unspecified: Secondary | ICD-10-CM | POA: Diagnosis not present

## 2020-09-25 DIAGNOSIS — C50211 Malignant neoplasm of upper-inner quadrant of right female breast: Secondary | ICD-10-CM | POA: Diagnosis not present

## 2020-09-25 DIAGNOSIS — F329 Major depressive disorder, single episode, unspecified: Secondary | ICD-10-CM | POA: Diagnosis not present

## 2020-09-25 DIAGNOSIS — G8929 Other chronic pain: Secondary | ICD-10-CM | POA: Diagnosis not present

## 2020-09-26 NOTE — Progress Notes (Signed)
This patient was seen in the infusion room today.  She had a fall on September 1 and has ongoing back and left hip pain.  She is scheduled to have a CT scan completed on 10/02/2020.  She sees Dr. Ninfa Linden as her pain specialist.  She is on Dilaudid 2 mg every 6 to 8 hours.  She also has tried oxycodone and Percocet which did not help.  She was given 2 mg of morphine IV today with improvement in her pain and was told to follow-up with Dr. Ninfa Linden regarding a refill of her Dilaudid.  She expresses understanding and agreement with this plan.  Sandi Mealy, MHS, PA-C Physician Assistant

## 2020-09-28 DIAGNOSIS — S42202D Unspecified fracture of upper end of left humerus, subsequent encounter for fracture with routine healing: Secondary | ICD-10-CM | POA: Diagnosis not present

## 2020-09-28 DIAGNOSIS — I10 Essential (primary) hypertension: Secondary | ICD-10-CM | POA: Diagnosis not present

## 2020-09-28 DIAGNOSIS — M4800 Spinal stenosis, site unspecified: Secondary | ICD-10-CM | POA: Diagnosis not present

## 2020-09-28 DIAGNOSIS — E039 Hypothyroidism, unspecified: Secondary | ICD-10-CM | POA: Diagnosis not present

## 2020-09-28 DIAGNOSIS — F329 Major depressive disorder, single episode, unspecified: Secondary | ICD-10-CM | POA: Diagnosis not present

## 2020-09-28 DIAGNOSIS — D539 Nutritional anemia, unspecified: Secondary | ICD-10-CM | POA: Diagnosis not present

## 2020-09-28 DIAGNOSIS — F419 Anxiety disorder, unspecified: Secondary | ICD-10-CM | POA: Diagnosis not present

## 2020-09-28 DIAGNOSIS — C50211 Malignant neoplasm of upper-inner quadrant of right female breast: Secondary | ICD-10-CM | POA: Diagnosis not present

## 2020-09-28 DIAGNOSIS — G8929 Other chronic pain: Secondary | ICD-10-CM | POA: Diagnosis not present

## 2020-09-30 ENCOUNTER — Other Ambulatory Visit: Payer: Self-pay | Admitting: Orthopaedic Surgery

## 2020-09-30 ENCOUNTER — Telehealth: Payer: Self-pay | Admitting: Orthopaedic Surgery

## 2020-09-30 MED ORDER — HYDROMORPHONE HCL 2 MG PO TABS
2.0000 mg | ORAL_TABLET | Freq: Four times a day (QID) | ORAL | 0 refills | Status: DC | PRN
Start: 2020-09-30 — End: 2020-10-14

## 2020-09-30 NOTE — Telephone Encounter (Signed)
Patient aware of the below message  

## 2020-09-30 NOTE — Telephone Encounter (Signed)
Pt called asking for a refill of her pain rx and states she's not sure wether it was hydrocodone or oxycodone that he sent in previously   [phone]

## 2020-09-30 NOTE — Telephone Encounter (Signed)
Please advise 

## 2020-09-30 NOTE — Telephone Encounter (Signed)
It was actually Dilaudid that [facility] sent in just a week ago. I will refill that today but she needs to use it sparingly and we cannot refill that medication again. Apparently we are awaiting a CT scan to make sure she does not have any type of hip fracture.

## 2020-10-01 ENCOUNTER — Other Ambulatory Visit: Payer: Self-pay | Admitting: Hematology and Oncology

## 2020-10-02 ENCOUNTER — Telehealth: Payer: Self-pay

## 2020-10-02 ENCOUNTER — Ambulatory Visit
Admission: RE | Admit: 2020-10-02 | Discharge: 2020-10-02 | Disposition: A | Discharge status: Home / Self Care | Payer: Medicare PPO | Source: Ambulatory Visit | Attending: Physician Assistant | Admitting: Physician Assistant

## 2020-10-02 DIAGNOSIS — M84352A Stress fracture, left femur, initial encounter for fracture: Secondary | ICD-10-CM | POA: Diagnosis not present

## 2020-10-02 DIAGNOSIS — M79605 Pain in left leg: Secondary | ICD-10-CM

## 2020-10-02 NOTE — Telephone Encounter (Signed)
Diane from [facility] called she wanted me to notify Artis Delay that the CT has been done and is on file call [phone]

## 2020-10-08 ENCOUNTER — Encounter: Payer: Self-pay | Admitting: Orthopaedic Surgery

## 2020-10-08 ENCOUNTER — Ambulatory Visit (INDEPENDENT_AMBULATORY_CARE_PROVIDER_SITE_OTHER): Payer: Medicare PPO | Admitting: Orthopaedic Surgery

## 2020-10-08 DIAGNOSIS — M84352A Stress fracture, left femur, initial encounter for fracture: Secondary | ICD-10-CM

## 2020-10-08 DIAGNOSIS — M25552 Pain in left hip: Secondary | ICD-10-CM

## 2020-10-08 DIAGNOSIS — S42292A Other displaced fracture of upper end of left humerus, initial encounter for closed fracture: Secondary | ICD-10-CM

## 2020-10-08 NOTE — Progress Notes (Signed)
Very pleasant 69 year old female who is following up for 2 different issues.  She does have a significant displaced proximal humerus fracture left shoulder that occurred about 7 weeks ago after mechanical fall.  She had been having left hip pain with ambulating and there was evidence on her plain films that were suspicious for potentially a stress fracture on the compression side of the femoral neck.  We sent her for CT scan to confirm this.  She is walking without assistive device and said that her pain in her hip is very minimal.  She is more concerned about the inability to reach overhead with her left shoulder.  Examination left shoulder it is clinically well located.  There is definitely weakness with abduction but she can abduct her shoulder.  Examination of her left hip shows no pain with range of motion and no pain with compression of the hip.  You for of the CT scan is reviewed with her and her husband.  There is a stress fracture of the the femoral neck at the compression site on the left hip but it looks like there is been interval healing and there is no worrisome features of this.  This is a fracture that can be treated with observation in limiting her mobility on that left hip.  She can weight-bear as tolerated but I will use a cane in her opposite right hand.  She will work on wall crawls with the left shoulder.  I will see her back in 4 weeks.  At that visit I would like 3 views of the left shoulder.  We do not need to x-ray the hip unless she is having hip pain.  All question concerns were answered and addressed.  If she does get an acute change in her status she will let us know.

## 2020-10-13 ENCOUNTER — Encounter: Payer: Self-pay | Admitting: *Deleted

## 2020-10-13 NOTE — Progress Notes (Signed)
Patient Care Team: Maurice Small, MD as PCP - General (Family Medicine) Rockwell Germany, RN as Oncology Nurse Navigator Tressie Ellis, Paulette Blanch, RN as Oncology Nurse Navigator Erroll Luna, MD as Consulting Physician (General Surgery) Nicholas Lose, MD as Consulting Physician (Hematology and Oncology) Gery Pray, MD as Consulting Physician (Radiation Oncology)  DIAGNOSIS:    ICD-10-CM   1. Malignant neoplasm of upper-inner quadrant of right breast in female, estrogen receptor positive ([place])  C50.211    Z17.0     SUMMARY OF ONCOLOGIC HISTORY: Oncology History  Malignant neoplasm of upper-inner quadrant of right breast in female, estrogen receptor positive ([place])  12/26/2019 Initial Diagnosis   Patient palpated a right breast lump x1wk. Mammogram and US showed two adjacent masses at the 2 o'clock position measuring 1.4cm and 0.6cm, calcifications in the outer right breast at the 9 o'clock position, no right axillary adenopathy. Biopsy showed IDC at the 2 o'clock position, grade 3, HER-2 + (3+), ER+ 90%, PR+ 30%, Ki67 30%, and DCIS in the upper outer right breast, high grade, ER+ 95%, PR 90%.   01/22/2020 Surgery   Right breast lumpectomy x2 ([facility]): Medial position: IDC, grade 3, 2.3cm, with high grade DCIS, clear margins Lateral position: high grade DCIS, clear margins, 4 right axillary lymph nodes negative    01/22/2020 Cancer Staging   Staging form: Breast, AJCC 8th Edition - Pathologic stage from 01/22/2020: Stage IA (pT2, pN0, cM0, G3, ER+, PR+, HER2+) - Signed by Gardenia Phlegm, NP on 02/06/2020   02/19/2020 -  Chemotherapy   The patient had PACLitaxel (TAXOL) 180 mg in sodium chloride 0.9 % 250 mL chemo infusion (</= 59m/m2), 80 mg/m2 = 180 mg, Intravenous,  Once, 3 of 3 cycles Dose modification: 65 mg/m2 (original dose 80 mg/m2, Cycle 2, Reason: Dose not tolerated), 65 mg/m2 (original dose 80 mg/m2, Cycle 2, Reason: Dose not tolerated) Administration: 180 mg (02/19/2020), 180 mg  (02/26/2020), 180 mg (03/18/2020), 180 mg (03/04/2020), 180 mg (03/11/2020), 144 mg (03/25/2020), 144 mg (04/01/2020), 144 mg (04/08/2020), 144 mg (04/15/2020) trastuzumab-anns (KANJINTI) 441 mg in sodium chloride 0.9 % 250 mL chemo infusion, 4 mg/kg = 441 mg (100 % of original dose 4 mg/kg), Intravenous,  Once, 10 of 16 cycles Dose modification: 4 mg/kg (original dose 4 mg/kg, Cycle 1, Reason: Other (see comments)), 6 mg/kg (original dose 6 mg/kg, Cycle 4, Reason: Other (see comments), Comment: insurance preferred biosimilar), 6 mg/kg (original dose 6 mg/kg, Cycle 10, Reason: Other (see comments)) Administration: 441 mg (02/19/2020), 210 mg (02/26/2020), 210 mg (03/04/2020), 210 mg (03/11/2020), 210 mg (03/18/2020), 210 mg (03/25/2020), 210 mg (04/01/2020), 210 mg (04/08/2020), 210 mg (04/15/2020), 651 mg (04/29/2020), 651 mg (06/10/2020), 651 mg (07/01/2020), 651 mg (07/22/2020), 651 mg (08/12/2020), 546 mg (09/02/2020), 546 mg (09/23/2020)  for chemotherapy treatment.    05/29/2020 - 06/25/2020 Radiation Therapy   Adjuvant radiation     CHIEF COMPLIANT: Herceptin maintenance  INTERVAL HISTORY: Isabel GECKis a 69y.o. with above-mentioned history of right breast cancerwhounderwent a right breast lumpectomy x2,adjuvant chemotherapy, radiation, and is currently onHerceptinmaintenance.She presents to the clinic todayfortreatment. She is in a lot of pain related to recent fractures.  Her major complaint is in the left shoulder.  She is unable to even move the arm.  Her left hip fracture is not bothering her as much.  ALLERGIES:  is allergic to aTeachers Insurance and Annuity Associationtartrate].  MEDICATIONS:  Current Outpatient Medications  Medication Sig Dispense Refill  . amitriptyline (ELAVIL) 25 MG tablet Take 75  mg by mouth at bedtime.     Marland Kitchen amLODipine (NORVASC) 5 MG tablet Take 7.5 mg by mouth at bedtime.     . Buprenorphine HCl (BELBUCA) 150 MCG FILM Place 150 mcg inside cheek in the morning and at bedtime. Prescribed by Dr.  Maryjean Ka    . citalopram (CELEXA) 40 MG tablet Take 40 mg by mouth daily.    . diazepam (VALIUM) 10 MG tablet TAKE 1 TABLET BY MOUTH AT BEDTIME IF NEEDED FOR SLEEP. 30 tablet 0  . doxycycline (VIBRAMYCIN) 50 MG capsule Take 2 capsules (100 mg total) by mouth 2 (two) times daily. 24 capsule 1  . FLUoxetine (PROZAC) 40 MG capsule Take 80 mg by mouth at bedtime.     . furosemide (LASIX) 20 MG tablet Take 1 tablet (20 mg total) by mouth daily as needed for fluid or edema. 30 tablet 3  . gabapentin (NEURONTIN) 100 MG capsule Take 1 capsule (100 mg total) by mouth 3 (three) times daily. 90 capsule 1  . HYDROmorphone (DILAUDID) 2 MG tablet Take 1 tablet (2 mg total) by mouth every 6 (six) hours as needed for severe pain. 25 tablet 0  . IBU 800 MG tablet TAKE 1 TABLET EVERY 8 HOURS AS NEEDED. 60 tablet 0  . levothyroxine (SYNTHROID) 150 MCG tablet Take 150 mcg by mouth daily.    Marland Kitchen levothyroxine (SYNTHROID, LEVOTHROID) 137 MCG tablet Take 137 mcg by mouth at bedtime.     . lidocaine-prilocaine (EMLA) cream Apply to affected area once (Patient taking differently: Apply 1 application topically daily as needed (prior to port being accessed.). Apply to affected area once) 30 g 3  . methocarbamol (ROBAXIN) 500 MG tablet Take 1 tablet (500 mg total) by mouth every 6 (six) hours as needed for muscle spasms. 60 tablet 1  . ondansetron (ZOFRAN) 8 MG tablet Take 1 tablet (8 mg total) by mouth 2 (two) times daily as needed (Nausea or vomiting). 30 tablet 1  . potassium chloride SA (KLOR-CON) 20 MEQ tablet Take 1 tablet (20 mEq total) by mouth 2 (two) times daily. Take 1 tablet 20 mEq daily 30 tablet 1  . prochlorperazine (COMPAZINE) 10 MG tablet Take 1 tablet (10 mg total) by mouth every 6 (six) hours as needed (Nausea or vomiting). 30 tablet 1  . venlafaxine XR (EFFEXOR-XR) 150 MG 24 hr capsule      No current facility-administered medications for this visit.    PHYSICAL EXAMINATION: ECOG PERFORMANCE STATUS: 1 -  Symptomatic but completely ambulatory  Vitals:   10/14/20 1329  BP: 131/85  Pulse: 80  Resp: 16  Temp: (!) 96.8 F (36 C)  SpO2: 100%   Filed Weights    LABORATORY DATA:  I have reviewed the data as listed CMP Latest Ref Rng & Units 09/02/2020 08/12/2020 07/22/2020  Glucose 70 - 99 mg/dL 94 117(H) 93  BUN 8 - 23 mg/dL 8 25(H) 17  Creatinine 0.44 - 1.00 mg/dL 0.71 0.79 0.77  Sodium 135 - 145 mmol/L 138 141 141  Potassium 3.5 - 5.1 mmol/L 3.0(LL) 4.1 4.2  Chloride 98 - 111 mmol/L 102 104 104  CO2 22 - 32 mmol/L '28 27 26  ' Calcium 8.9 - 10.3 mg/dL 9.1 9.3 9.0  Total Protein 6.5 - 8.1 g/dL 6.5 6.6 6.6  Total Bilirubin 0.3 - 1.2 mg/dL 0.4 0.3 0.3  Alkaline Phos 38 - 126 U/L 139(H) 109 100  AST 15 - 41 U/L '19 15 16  ' ALT 0 - 44 U/L 13  16 18    Lab Results  Component Value Date   WBC 5.2 10/14/2020   HGB 11.6 (L) 10/14/2020   HCT 35.4 (L) 10/14/2020   MCV 95.4 10/14/2020   PLT 239 10/14/2020   NEUTROABS 3.4 10/14/2020    ASSESSMENT & PLAN:  Malignant neoplasm of upper-inner quadrant of right breast in female, estrogen receptor positive ([place]) 01/22/2020: Right medial lumpectomy: Grade 3 IDC 2.3 cm with high-grade DCIS with necrosis, margins negative, negative for lymphovascular or perineural invasion, Right lateral lumpectomy: Isolated foci of DCIS high-grade, resection margins negative 4 lymph nodes negative, ER 90%, PR 30%, HER-2 3+ positive, Ki-67 30%  Treatment plan: 1.Adjuvant chemotherapy with Taxol Herceptin weekly x9(stopped early because of lower extremity edema)followed by Herceptin maintenance 2.Adjuvant radiation therapystarted 05/29/2020 completed 06/25/2020 3.Followed by adjuvant antiestrogen therapy. ----------------------------------------------------------------------------------------------------------------------------------------------------------- Current treatment:Herceptin maintenance and adjuvant radiation Echocardiogram 02/04/2020: EF 60 to  65%  Herceptin toxicities: 1.Lower extremity edema leftleg.Required hospitalization for cellulitis treated with IV antibiotics. Patient has Lasix but she is been holding it because of recent fracture  2. Diarrhea: Due to Herceptin.  I sent a prescription for Florastor probiotics.  Major depression: Currently on Prozac. Pain issues: Currently on Percocets 2.5 mg.  08/21/2020: Mildly comminuted and displaced fracture of proximal left humerus: After a fall 10/02/2020: Left hip femoral fracture  Leg pain:Currently on Percocets.  I will consult palliative care to assist with pain management.  We will give her 2 mg of morphine sulfate IV in the infusion. Hypokalemia:On oral potassium daily.  Return to clinicevery 3 weeks for Herceptin maintenanceand every 6 weeks to follow-up with me.  She completes her treatment February 2022.    No orders of the defined types were placed in this encounter.  The patient has a good understanding of the overall plan. she agrees with it. she will call with any problems that may develop before the next visit here.  Total time spent: 30 mins including face to face time and time spent for planning, charting and coordination of care  Nicholas Lose, MD 10/14/2020  I, Cloyde Reams Dorshimer, am acting as scribe for Dr. Nicholas Lose.  I have reviewed the above documentation for accuracy and completeness, and I agree with the above.

## 2020-10-14 ENCOUNTER — Other Ambulatory Visit: Payer: Self-pay

## 2020-10-14 ENCOUNTER — Inpatient Hospital Stay (HOSPITAL_BASED_OUTPATIENT_CLINIC_OR_DEPARTMENT_OTHER): Payer: Medicare PPO | Admitting: Hematology and Oncology

## 2020-10-14 ENCOUNTER — Inpatient Hospital Stay: Payer: Medicare PPO

## 2020-10-14 ENCOUNTER — Other Ambulatory Visit: Payer: Self-pay | Admitting: *Deleted

## 2020-10-14 ENCOUNTER — Other Ambulatory Visit: Payer: Self-pay | Admitting: Orthopaedic Surgery

## 2020-10-14 DIAGNOSIS — Z170 Estrogen receptor positive status [ER+]: Secondary | ICD-10-CM | POA: Diagnosis not present

## 2020-10-14 DIAGNOSIS — C50211 Malignant neoplasm of upper-inner quadrant of right female breast: Secondary | ICD-10-CM

## 2020-10-14 DIAGNOSIS — M549 Dorsalgia, unspecified: Secondary | ICD-10-CM | POA: Diagnosis not present

## 2020-10-14 DIAGNOSIS — Z5181 Encounter for therapeutic drug level monitoring: Secondary | ICD-10-CM

## 2020-10-14 DIAGNOSIS — M25552 Pain in left hip: Secondary | ICD-10-CM | POA: Diagnosis not present

## 2020-10-14 DIAGNOSIS — Z95828 Presence of other vascular implants and grafts: Secondary | ICD-10-CM

## 2020-10-14 DIAGNOSIS — S72002S Fracture of unspecified part of neck of left femur, sequela: Secondary | ICD-10-CM

## 2020-10-14 DIAGNOSIS — Z5112 Encounter for antineoplastic immunotherapy: Secondary | ICD-10-CM | POA: Diagnosis not present

## 2020-10-14 DIAGNOSIS — Z79899 Other long term (current) drug therapy: Secondary | ICD-10-CM

## 2020-10-14 LAB — CBC WITH DIFFERENTIAL (CANCER CENTER ONLY)
Abs Immature Granulocytes: 0.01 10*3/uL (ref 0.00–0.07)
Basophils Absolute: 0 10*3/uL (ref 0.0–0.1)
Basophils Relative: 0 %
Eosinophils Absolute: 0.1 10*3/uL (ref 0.0–0.5)
Eosinophils Relative: 2 %
HCT: 35.4 % — ABNORMAL LOW (ref 36.0–46.0)
Hemoglobin: 11.6 g/dL — ABNORMAL LOW (ref 12.0–15.0)
Immature Granulocytes: 0 %
Lymphocytes Relative: 24 %
Lymphs Abs: 1.2 10*3/uL (ref 0.7–4.0)
MCH: 31.3 pg (ref 26.0–34.0)
MCHC: 32.8 g/dL (ref 30.0–36.0)
MCV: 95.4 fL (ref 80.0–100.0)
Monocytes Absolute: 0.4 10*3/uL (ref 0.1–1.0)
Monocytes Relative: 8 %
Neutro Abs: 3.4 10*3/uL (ref 1.7–7.7)
Neutrophils Relative %: 66 %
Platelet Count: 239 10*3/uL (ref 150–400)
RBC: 3.71 MIL/uL — ABNORMAL LOW (ref 3.87–5.11)
RDW: 12.9 % (ref 11.5–15.5)
WBC Count: 5.2 10*3/uL (ref 4.0–10.5)
nRBC: 0 % (ref 0.0–0.2)

## 2020-10-14 LAB — CMP (CANCER CENTER ONLY)
ALT: 10 U/L (ref 0–44)
AST: 10 U/L — ABNORMAL LOW (ref 15–41)
Albumin: 3.4 g/dL — ABNORMAL LOW (ref 3.5–5.0)
Alkaline Phosphatase: 108 U/L (ref 38–126)
Anion gap: 7 (ref 5–15)
BUN: 14 mg/dL (ref 8–23)
CO2: 26 mmol/L (ref 22–32)
Calcium: 9.1 mg/dL (ref 8.9–10.3)
Chloride: 103 mmol/L (ref 98–111)
Creatinine: 0.69 mg/dL (ref 0.44–1.00)
GFR, Estimated: >60 mL/min (ref >60)
Glucose, Bld: 98 mg/dL (ref 70–99)
Potassium: 3.8 mmol/L (ref 3.5–5.1)
Sodium: 136 mmol/L (ref 135–145)
Total Bilirubin: 0.4 mg/dL (ref 0.3–1.2)
Total Protein: 6.2 g/dL — ABNORMAL LOW (ref 6.5–8.1)

## 2020-10-14 MED ORDER — MORPHINE SULFATE 2 MG/ML IJ SOLN
2.0000 mg | Freq: Once | INTRAMUSCULAR | Status: AC
  Administered 2020-10-14: 2 mg via INTRAVENOUS
  Filled 2020-10-14: qty 1

## 2020-10-14 MED ORDER — ACETAMINOPHEN 325 MG PO TABS
650.0000 mg | ORAL_TABLET | Freq: Once | ORAL | Status: AC
  Administered 2020-10-14: 650 mg via ORAL

## 2020-10-14 MED ORDER — SODIUM CHLORIDE 0.9% FLUSH
10.0000 mL | Freq: Once | INTRAVENOUS | Status: AC
  Administered 2020-10-14: 10 mL
  Filled 2020-10-14: qty 10

## 2020-10-14 MED ORDER — DIPHENHYDRAMINE HCL 25 MG PO CAPS
50.0000 mg | ORAL_CAPSULE | Freq: Once | ORAL | Status: AC
  Administered 2020-10-14: 50 mg via ORAL

## 2020-10-14 MED ORDER — ACETAMINOPHEN 325 MG PO TABS
ORAL_TABLET | ORAL | Status: AC
  Filled 2020-10-14: qty 2

## 2020-10-14 MED ORDER — SACCHAROMYCES BOULARDII 250 MG PO CAPS: 250.0000 mg | ORAL_CAPSULE | Freq: Two times a day (BID) | ORAL | 6 refills | Status: DC

## 2020-10-14 MED ORDER — DIPHENHYDRAMINE HCL 25 MG PO CAPS
ORAL_CAPSULE | ORAL | Status: AC
  Filled 2020-10-14: qty 2

## 2020-10-14 MED ORDER — SODIUM CHLORIDE 0.9% FLUSH
10.0000 mL | INTRAVENOUS | Status: DC | PRN
  Administered 2020-10-14: 10 mL
  Filled 2020-10-14: qty 10

## 2020-10-14 MED ORDER — MORPHINE SULFATE (PF) 2 MG/ML IV SOLN
INTRAVENOUS | Status: AC
  Filled 2020-10-14: qty 1

## 2020-10-14 MED ORDER — HEPARIN SOD (PORK) LOCK FLUSH 100 UNIT/ML IV SOLN
500.0000 [IU] | Freq: Once | INTRAVENOUS | Status: AC | PRN
  Administered 2020-10-14: 500 [IU]
  Filled 2020-10-14: qty 5

## 2020-10-14 MED ORDER — SODIUM CHLORIDE 0.9 % IV SOLN
Freq: Once | INTRAVENOUS | Status: AC
  Filled 2020-10-14: qty 250

## 2020-10-14 MED ORDER — TRASTUZUMAB-ANNS CHEMO 150 MG IV SOLR
6.0000 mg/kg | Freq: Once | INTRAVENOUS | Status: AC
  Administered 2020-10-14: 546 mg via INTRAVENOUS
  Filled 2020-10-14: qty 26

## 2020-10-14 NOTE — Patient Instructions (Signed)
[facility] Discharge Instructions for Patients Receiving Chemotherapy  Today you received the following chemotherapy agents: trastuzumab  To help prevent nausea and vomiting after your treatment, we encourage you to take your nausea medication  as prescribed.    If you develop nausea and vomiting that is not controlled by your nausea medication, call the clinic.   BELOW ARE SYMPTOMS THAT SHOULD BE REPORTED IMMEDIATELY:  *FEVER GREATER THAN 100.5 F  *CHILLS WITH OR WITHOUT FEVER  NAUSEA AND VOMITING THAT IS NOT CONTROLLED WITH YOUR NAUSEA MEDICATION  *UNUSUAL SHORTNESS OF BREATH  *UNUSUAL BRUISING OR BLEEDING  TENDERNESS IN MOUTH AND THROAT WITH OR WITHOUT PRESENCE OF ULCERS  *URINARY PROBLEMS  *BOWEL PROBLEMS  UNUSUAL RASH Items with * indicate a potential emergency and should be followed up as soon as possible.  Feel free to call the clinic should you have any questions or concerns. The clinic phone number is [phone].  Please show the [place] at check-in to the Emergency Department and triage nurse.

## 2020-10-14 NOTE — Progress Notes (Signed)
Per MD request, RN placed referral and called intake in to [facility] with [facility].

## 2020-10-14 NOTE — Assessment & Plan Note (Signed)
01/22/2020: Right medial lumpectomy: Grade 3 IDC 2.3 cm with high-grade DCIS with necrosis, margins negative, negative for lymphovascular or perineural invasion, Right lateral lumpectomy: Isolated foci of DCIS high-grade, resection margins negative 4 lymph nodes negative, ER 90%, PR 30%, HER-2 3+ positive, Ki-67 30%  Treatment plan: 1.Adjuvant chemotherapy with Taxol Herceptin weekly x9(stopped early because of lower extremity edema)followed by Herceptin maintenance 2.Adjuvant radiation therapystarted 05/29/2020 completed 06/25/2020 3.Followed by adjuvant antiestrogen therapy. ----------------------------------------------------------------------------------------------------------------------------------------------------------- Current treatment:Herceptin maintenance and adjuvant radiation Echocardiogram 02/04/2020: EF 60 to 65%  Herceptin toxicities: 1.Lower extremity edema leftleg.Required hospitalization for cellulitis treated with IV antibiotics. Patient has Lasix but she is been holding it because of recent fracture  2. Diarrhea: Due to Herceptin.   Major depression: Currently on Prozac. Pain issues: Currently on Percocets 2.5 mg.  08/21/2020: Mildly comminuted and displaced fracture of proximal left humerus: After a fall 10/02/2020: Left hip femoral fracture  Leg pain:Currently on Percocets. Hypokalemia:On oral potassium daily.  Return to clinicevery 3 weeks for Herceptin maintenanceand every 6 weeks to follow-up with me.  She completes her treatment February 2022.

## 2020-10-14 NOTE — Patient Instructions (Signed)

## 2020-10-16 ENCOUNTER — Ambulatory Visit (HOSPITAL_COMMUNITY)
Admission: RE | Admit: 2020-10-16 | Discharge: 2020-10-16 | Disposition: A | Discharge status: Home / Self Care | Payer: Medicare PPO | Source: Ambulatory Visit | Attending: Hematology and Oncology | Admitting: Hematology and Oncology

## 2020-10-16 ENCOUNTER — Other Ambulatory Visit: Payer: Self-pay

## 2020-10-16 DIAGNOSIS — I351 Nonrheumatic aortic (valve) insufficiency: Secondary | ICD-10-CM | POA: Diagnosis not present

## 2020-10-16 DIAGNOSIS — Z5181 Encounter for therapeutic drug level monitoring: Secondary | ICD-10-CM

## 2020-10-16 DIAGNOSIS — I119 Hypertensive heart disease without heart failure: Secondary | ICD-10-CM | POA: Insufficient documentation

## 2020-10-16 DIAGNOSIS — Z0189 Encounter for other specified special examinations: Secondary | ICD-10-CM | POA: Diagnosis not present

## 2020-10-16 DIAGNOSIS — Z79899 Other long term (current) drug therapy: Secondary | ICD-10-CM | POA: Insufficient documentation

## 2020-10-16 DIAGNOSIS — Z859 Personal history of malignant neoplasm, unspecified: Secondary | ICD-10-CM | POA: Diagnosis not present

## 2020-10-16 LAB — ECHOCARDIOGRAM COMPLETE
Area-P 1/2: 4.15 cm2
S' Lateral: 3 cm

## 2020-10-16 NOTE — Progress Notes (Signed)
  Echocardiogram 2D Echocardiogram has been performed.  [name] 10/16/2020, 10:05 AM

## 2020-10-17 ENCOUNTER — Telehealth: Payer: Self-pay | Admitting: Hematology and Oncology

## 2020-10-17 NOTE — Telephone Encounter (Signed)
No 10/26 los, no changes made to pt schedule

## 2020-10-22 ENCOUNTER — Telehealth: Payer: Self-pay

## 2020-10-22 NOTE — Telephone Encounter (Signed)
Yes, they can resume PT.

## 2020-10-22 NOTE — Telephone Encounter (Signed)
Flora from pt called she wants to know if patient is ready to resume pt for lower extremities. Call back:[phone]

## 2020-10-22 NOTE — Telephone Encounter (Signed)
LMOM for PT of the below message

## 2020-10-25 DIAGNOSIS — S42202D Unspecified fracture of upper end of left humerus, subsequent encounter for fracture with routine healing: Secondary | ICD-10-CM | POA: Diagnosis not present

## 2020-10-25 DIAGNOSIS — F329 Major depressive disorder, single episode, unspecified: Secondary | ICD-10-CM | POA: Diagnosis not present

## 2020-10-25 DIAGNOSIS — M4800 Spinal stenosis, site unspecified: Secondary | ICD-10-CM | POA: Diagnosis not present

## 2020-10-25 DIAGNOSIS — I10 Essential (primary) hypertension: Secondary | ICD-10-CM | POA: Diagnosis not present

## 2020-10-25 DIAGNOSIS — F419 Anxiety disorder, unspecified: Secondary | ICD-10-CM | POA: Diagnosis not present

## 2020-10-25 DIAGNOSIS — G8929 Other chronic pain: Secondary | ICD-10-CM | POA: Diagnosis not present

## 2020-10-25 DIAGNOSIS — E039 Hypothyroidism, unspecified: Secondary | ICD-10-CM | POA: Diagnosis not present

## 2020-10-25 DIAGNOSIS — D539 Nutritional anemia, unspecified: Secondary | ICD-10-CM | POA: Diagnosis not present

## 2020-10-25 DIAGNOSIS — C50211 Malignant neoplasm of upper-inner quadrant of right female breast: Secondary | ICD-10-CM | POA: Diagnosis not present

## 2020-10-27 DIAGNOSIS — C50211 Malignant neoplasm of upper-inner quadrant of right female breast: Secondary | ICD-10-CM | POA: Diagnosis not present

## 2020-10-27 DIAGNOSIS — I10 Essential (primary) hypertension: Secondary | ICD-10-CM | POA: Diagnosis not present

## 2020-10-27 DIAGNOSIS — G8929 Other chronic pain: Secondary | ICD-10-CM | POA: Diagnosis not present

## 2020-10-27 DIAGNOSIS — M4800 Spinal stenosis, site unspecified: Secondary | ICD-10-CM | POA: Diagnosis not present

## 2020-10-27 DIAGNOSIS — F329 Major depressive disorder, single episode, unspecified: Secondary | ICD-10-CM | POA: Diagnosis not present

## 2020-10-27 DIAGNOSIS — F419 Anxiety disorder, unspecified: Secondary | ICD-10-CM | POA: Diagnosis not present

## 2020-10-27 DIAGNOSIS — D539 Nutritional anemia, unspecified: Secondary | ICD-10-CM | POA: Diagnosis not present

## 2020-10-27 DIAGNOSIS — E039 Hypothyroidism, unspecified: Secondary | ICD-10-CM | POA: Diagnosis not present

## 2020-10-27 DIAGNOSIS — S42202D Unspecified fracture of upper end of left humerus, subsequent encounter for fracture with routine healing: Secondary | ICD-10-CM | POA: Diagnosis not present

## 2020-10-28 DIAGNOSIS — M4800 Spinal stenosis, site unspecified: Secondary | ICD-10-CM | POA: Diagnosis not present

## 2020-10-28 DIAGNOSIS — C50211 Malignant neoplasm of upper-inner quadrant of right female breast: Secondary | ICD-10-CM | POA: Diagnosis not present

## 2020-10-28 DIAGNOSIS — S72002D Fracture of unspecified part of neck of left femur, subsequent encounter for closed fracture with routine healing: Secondary | ICD-10-CM | POA: Diagnosis not present

## 2020-10-28 DIAGNOSIS — F419 Anxiety disorder, unspecified: Secondary | ICD-10-CM | POA: Diagnosis not present

## 2020-10-28 DIAGNOSIS — G43909 Migraine, unspecified, not intractable, without status migrainosus: Secondary | ICD-10-CM | POA: Diagnosis not present

## 2020-10-28 DIAGNOSIS — S42202D Unspecified fracture of upper end of left humerus, subsequent encounter for fracture with routine healing: Secondary | ICD-10-CM | POA: Diagnosis not present

## 2020-10-28 DIAGNOSIS — D539 Nutritional anemia, unspecified: Secondary | ICD-10-CM | POA: Diagnosis not present

## 2020-10-28 DIAGNOSIS — F5101 Primary insomnia: Secondary | ICD-10-CM | POA: Diagnosis not present

## 2020-10-28 DIAGNOSIS — I10 Essential (primary) hypertension: Secondary | ICD-10-CM | POA: Diagnosis not present

## 2020-10-30 ENCOUNTER — Telehealth: Payer: Self-pay

## 2020-10-30 DIAGNOSIS — M4800 Spinal stenosis, site unspecified: Secondary | ICD-10-CM | POA: Diagnosis not present

## 2020-10-30 DIAGNOSIS — C50211 Malignant neoplasm of upper-inner quadrant of right female breast: Secondary | ICD-10-CM | POA: Diagnosis not present

## 2020-10-30 DIAGNOSIS — F419 Anxiety disorder, unspecified: Secondary | ICD-10-CM | POA: Diagnosis not present

## 2020-10-30 DIAGNOSIS — F5101 Primary insomnia: Secondary | ICD-10-CM | POA: Diagnosis not present

## 2020-10-30 DIAGNOSIS — S72002D Fracture of unspecified part of neck of left femur, subsequent encounter for closed fracture with routine healing: Secondary | ICD-10-CM | POA: Diagnosis not present

## 2020-10-30 DIAGNOSIS — S42202D Unspecified fracture of upper end of left humerus, subsequent encounter for fracture with routine healing: Secondary | ICD-10-CM | POA: Diagnosis not present

## 2020-10-30 DIAGNOSIS — I10 Essential (primary) hypertension: Secondary | ICD-10-CM | POA: Diagnosis not present

## 2020-10-30 DIAGNOSIS — D539 Nutritional anemia, unspecified: Secondary | ICD-10-CM | POA: Diagnosis not present

## 2020-10-30 DIAGNOSIS — G43909 Migraine, unspecified, not intractable, without status migrainosus: Secondary | ICD-10-CM | POA: Diagnosis not present

## 2020-10-30 NOTE — Telephone Encounter (Signed)
Spoke with patient to schedule new in person consult for Palliative services. Patient verbally agreed. Appointment scheduled for 11/11/20 at 12:30

## 2020-11-02 ENCOUNTER — Other Ambulatory Visit: Payer: Self-pay | Admitting: Hematology and Oncology

## 2020-11-04 ENCOUNTER — Inpatient Hospital Stay: Payer: Medicare PPO | Attending: Hematology and Oncology

## 2020-11-04 ENCOUNTER — Other Ambulatory Visit: Payer: Self-pay

## 2020-11-04 VITALS — BP 162/86 | HR 92 | Temp 98.1°F | Resp 17

## 2020-11-04 DIAGNOSIS — C50211 Malignant neoplasm of upper-inner quadrant of right female breast: Secondary | ICD-10-CM | POA: Diagnosis not present

## 2020-11-04 DIAGNOSIS — Z170 Estrogen receptor positive status [ER+]: Secondary | ICD-10-CM | POA: Diagnosis not present

## 2020-11-04 DIAGNOSIS — Z5112 Encounter for antineoplastic immunotherapy: Secondary | ICD-10-CM | POA: Diagnosis not present

## 2020-11-04 MED ORDER — ACETAMINOPHEN 325 MG PO TABS
ORAL_TABLET | ORAL | Status: AC
  Filled 2020-11-04: qty 2

## 2020-11-04 MED ORDER — TRASTUZUMAB-ANNS CHEMO 150 MG IV SOLR
6.0000 mg/kg | Freq: Once | INTRAVENOUS | Status: AC
  Administered 2020-11-04: 546 mg via INTRAVENOUS
  Filled 2020-11-04: qty 26

## 2020-11-04 MED ORDER — ACETAMINOPHEN 325 MG PO TABS
650.0000 mg | ORAL_TABLET | Freq: Once | ORAL | Status: AC
  Administered 2020-11-04: 650 mg via ORAL

## 2020-11-04 MED ORDER — SODIUM CHLORIDE 0.9 % IV SOLN
Freq: Once | INTRAVENOUS | Status: AC
  Filled 2020-11-04: qty 250

## 2020-11-04 MED ORDER — SODIUM CHLORIDE 0.9% FLUSH
10.0000 mL | INTRAVENOUS | Status: DC | PRN
  Administered 2020-11-04: 10 mL
  Filled 2020-11-04: qty 10

## 2020-11-04 MED ORDER — DIPHENHYDRAMINE HCL 25 MG PO CAPS
ORAL_CAPSULE | ORAL | Status: AC
  Filled 2020-11-04: qty 2

## 2020-11-04 MED ORDER — HEPARIN SOD (PORK) LOCK FLUSH 100 UNIT/ML IV SOLN
500.0000 [IU] | Freq: Once | INTRAVENOUS | Status: AC | PRN
  Administered 2020-11-04: 500 [IU]
  Filled 2020-11-04: qty 5

## 2020-11-04 MED ORDER — DIPHENHYDRAMINE HCL 25 MG PO CAPS
50.0000 mg | ORAL_CAPSULE | Freq: Once | ORAL | Status: AC
  Administered 2020-11-04: 50 mg via ORAL

## 2020-11-04 NOTE — Progress Notes (Signed)
Pt refuses to stand for weight to check dose.  Will keep dose as given previously.  Per note pt has been experiencing diarrhea

## 2020-11-04 NOTE — Patient Instructions (Signed)
[facility] Discharge Instructions for Patients Receiving Chemotherapy  Today you received the following chemotherapy agents Trastuzumab-anns  To help prevent nausea and vomiting after your treatment, we encourage you to take your nausea medication as directed.   If you develop nausea and vomiting that is not controlled by your nausea medication, call the clinic.   BELOW ARE SYMPTOMS THAT SHOULD BE REPORTED IMMEDIATELY:  *FEVER GREATER THAN 100.5 F  *CHILLS WITH OR WITHOUT FEVER  NAUSEA AND VOMITING THAT IS NOT CONTROLLED WITH YOUR NAUSEA MEDICATION  *UNUSUAL SHORTNESS OF BREATH  *UNUSUAL BRUISING OR BLEEDING  TENDERNESS IN MOUTH AND THROAT WITH OR WITHOUT PRESENCE OF ULCERS  *URINARY PROBLEMS  *BOWEL PROBLEMS  UNUSUAL RASH Items with * indicate a potential emergency and should be followed up as soon as possible.  Feel free to call the clinic should you have any questions or concerns. The clinic phone number is [phone].  Please show the [place] at check-in to the Emergency Department and triage nurse.

## 2020-11-04 NOTE — Progress Notes (Signed)
Pt declined to stand for weight today

## 2020-11-05 ENCOUNTER — Ambulatory Visit (INDEPENDENT_AMBULATORY_CARE_PROVIDER_SITE_OTHER): Payer: Medicare PPO | Admitting: Orthopaedic Surgery

## 2020-11-05 ENCOUNTER — Encounter: Payer: Self-pay | Admitting: Orthopaedic Surgery

## 2020-11-05 ENCOUNTER — Ambulatory Visit (INDEPENDENT_AMBULATORY_CARE_PROVIDER_SITE_OTHER): Payer: Medicare PPO

## 2020-11-05 DIAGNOSIS — S72002D Fracture of unspecified part of neck of left femur, subsequent encounter for closed fracture with routine healing: Secondary | ICD-10-CM | POA: Diagnosis not present

## 2020-11-05 DIAGNOSIS — S42292A Other displaced fracture of upper end of left humerus, initial encounter for closed fracture: Secondary | ICD-10-CM | POA: Diagnosis not present

## 2020-11-05 DIAGNOSIS — M4800 Spinal stenosis, site unspecified: Secondary | ICD-10-CM | POA: Diagnosis not present

## 2020-11-05 DIAGNOSIS — S42292D Other displaced fracture of upper end of left humerus, subsequent encounter for fracture with routine healing: Secondary | ICD-10-CM

## 2020-11-05 DIAGNOSIS — S42202D Unspecified fracture of upper end of left humerus, subsequent encounter for fracture with routine healing: Secondary | ICD-10-CM | POA: Diagnosis not present

## 2020-11-05 DIAGNOSIS — C50211 Malignant neoplasm of upper-inner quadrant of right female breast: Secondary | ICD-10-CM | POA: Diagnosis not present

## 2020-11-05 DIAGNOSIS — F419 Anxiety disorder, unspecified: Secondary | ICD-10-CM | POA: Diagnosis not present

## 2020-11-05 DIAGNOSIS — I10 Essential (primary) hypertension: Secondary | ICD-10-CM | POA: Diagnosis not present

## 2020-11-05 DIAGNOSIS — F5101 Primary insomnia: Secondary | ICD-10-CM | POA: Diagnosis not present

## 2020-11-05 DIAGNOSIS — D539 Nutritional anemia, unspecified: Secondary | ICD-10-CM | POA: Diagnosis not present

## 2020-11-05 DIAGNOSIS — G43909 Migraine, unspecified, not intractable, without status migrainosus: Secondary | ICD-10-CM | POA: Diagnosis not present

## 2020-11-05 MED ORDER — OXYCODONE-ACETAMINOPHEN 5-325 MG PO TABS
1.0000 | ORAL_TABLET | Freq: Four times a day (QID) | ORAL | 0 refills | Status: DC | PRN
Start: 2020-11-05 — End: 2021-01-28

## 2020-11-05 NOTE — Progress Notes (Signed)
The patient still dealing with significant pain and weakness from a left shoulder proximal humerus fracture.  She is really treated with a sling appropriately.  She has been dealing with cancer treatments and we wanted to hold off on any intervention for the shoulder.  Her previous films showed good alignment in terms of the shoulder that could heal.  She is still dealing with shoulder pain.  With out of the sling.  She is about 10 weeks out from original injury.  She is working with therapy on balance and coordination.  Occupational Therapy has been to start working on the shoulder.  Her biggest issues are significant decrease in her actives daily living as it relates to her left shoulder and the weakness in that shoulder.  On examination of her left shoulder, it does move his unit but is painful.  It is clinically located.  X-rays of the left shoulder are reviewed and I do feel that the humeral head is rotated a little more than it did from all of her previous films.  I am fine with letting Occupational Therapy start working on some gentle motion of her left shoulder.  I would like to have her seen by my partner Dr. Marlou Sa so he can talk to her about the possibility that she may need a shoulder replacement at some point.  We are going to still try to avoid any operative treatment if possible.  All question concerns were answered and addressed.

## 2020-11-07 DIAGNOSIS — F5101 Primary insomnia: Secondary | ICD-10-CM | POA: Diagnosis not present

## 2020-11-07 DIAGNOSIS — F419 Anxiety disorder, unspecified: Secondary | ICD-10-CM | POA: Diagnosis not present

## 2020-11-07 DIAGNOSIS — I10 Essential (primary) hypertension: Secondary | ICD-10-CM | POA: Diagnosis not present

## 2020-11-07 DIAGNOSIS — G43909 Migraine, unspecified, not intractable, without status migrainosus: Secondary | ICD-10-CM | POA: Diagnosis not present

## 2020-11-07 DIAGNOSIS — C50211 Malignant neoplasm of upper-inner quadrant of right female breast: Secondary | ICD-10-CM | POA: Diagnosis not present

## 2020-11-07 DIAGNOSIS — S72002D Fracture of unspecified part of neck of left femur, subsequent encounter for closed fracture with routine healing: Secondary | ICD-10-CM | POA: Diagnosis not present

## 2020-11-07 DIAGNOSIS — M4800 Spinal stenosis, site unspecified: Secondary | ICD-10-CM | POA: Diagnosis not present

## 2020-11-07 DIAGNOSIS — D539 Nutritional anemia, unspecified: Secondary | ICD-10-CM | POA: Diagnosis not present

## 2020-11-07 DIAGNOSIS — S42202D Unspecified fracture of upper end of left humerus, subsequent encounter for fracture with routine healing: Secondary | ICD-10-CM | POA: Diagnosis not present

## 2020-11-10 DIAGNOSIS — F5101 Primary insomnia: Secondary | ICD-10-CM | POA: Diagnosis not present

## 2020-11-10 DIAGNOSIS — I10 Essential (primary) hypertension: Secondary | ICD-10-CM | POA: Diagnosis not present

## 2020-11-10 DIAGNOSIS — F419 Anxiety disorder, unspecified: Secondary | ICD-10-CM | POA: Diagnosis not present

## 2020-11-10 DIAGNOSIS — M4800 Spinal stenosis, site unspecified: Secondary | ICD-10-CM | POA: Diagnosis not present

## 2020-11-10 DIAGNOSIS — S72002D Fracture of unspecified part of neck of left femur, subsequent encounter for closed fracture with routine healing: Secondary | ICD-10-CM | POA: Diagnosis not present

## 2020-11-10 DIAGNOSIS — D539 Nutritional anemia, unspecified: Secondary | ICD-10-CM | POA: Diagnosis not present

## 2020-11-10 DIAGNOSIS — G43909 Migraine, unspecified, not intractable, without status migrainosus: Secondary | ICD-10-CM | POA: Diagnosis not present

## 2020-11-10 DIAGNOSIS — C50211 Malignant neoplasm of upper-inner quadrant of right female breast: Secondary | ICD-10-CM | POA: Diagnosis not present

## 2020-11-10 DIAGNOSIS — S42202D Unspecified fracture of upper end of left humerus, subsequent encounter for fracture with routine healing: Secondary | ICD-10-CM | POA: Diagnosis not present

## 2020-11-11 ENCOUNTER — Other Ambulatory Visit: Payer: Self-pay | Admitting: Nurse Practitioner

## 2020-11-12 DIAGNOSIS — D539 Nutritional anemia, unspecified: Secondary | ICD-10-CM | POA: Diagnosis not present

## 2020-11-12 DIAGNOSIS — F5101 Primary insomnia: Secondary | ICD-10-CM | POA: Diagnosis not present

## 2020-11-12 DIAGNOSIS — I10 Essential (primary) hypertension: Secondary | ICD-10-CM | POA: Diagnosis not present

## 2020-11-12 DIAGNOSIS — S42202D Unspecified fracture of upper end of left humerus, subsequent encounter for fracture with routine healing: Secondary | ICD-10-CM | POA: Diagnosis not present

## 2020-11-12 DIAGNOSIS — F419 Anxiety disorder, unspecified: Secondary | ICD-10-CM | POA: Diagnosis not present

## 2020-11-12 DIAGNOSIS — S72002D Fracture of unspecified part of neck of left femur, subsequent encounter for closed fracture with routine healing: Secondary | ICD-10-CM | POA: Diagnosis not present

## 2020-11-12 DIAGNOSIS — C50211 Malignant neoplasm of upper-inner quadrant of right female breast: Secondary | ICD-10-CM | POA: Diagnosis not present

## 2020-11-12 DIAGNOSIS — M4800 Spinal stenosis, site unspecified: Secondary | ICD-10-CM | POA: Diagnosis not present

## 2020-11-12 DIAGNOSIS — G43909 Migraine, unspecified, not intractable, without status migrainosus: Secondary | ICD-10-CM | POA: Diagnosis not present

## 2020-11-17 DIAGNOSIS — M4800 Spinal stenosis, site unspecified: Secondary | ICD-10-CM | POA: Diagnosis not present

## 2020-11-17 DIAGNOSIS — F419 Anxiety disorder, unspecified: Secondary | ICD-10-CM | POA: Diagnosis not present

## 2020-11-17 DIAGNOSIS — G43909 Migraine, unspecified, not intractable, without status migrainosus: Secondary | ICD-10-CM | POA: Diagnosis not present

## 2020-11-17 DIAGNOSIS — D539 Nutritional anemia, unspecified: Secondary | ICD-10-CM | POA: Diagnosis not present

## 2020-11-17 DIAGNOSIS — F5101 Primary insomnia: Secondary | ICD-10-CM | POA: Diagnosis not present

## 2020-11-17 DIAGNOSIS — S42202D Unspecified fracture of upper end of left humerus, subsequent encounter for fracture with routine healing: Secondary | ICD-10-CM | POA: Diagnosis not present

## 2020-11-17 DIAGNOSIS — C50211 Malignant neoplasm of upper-inner quadrant of right female breast: Secondary | ICD-10-CM | POA: Diagnosis not present

## 2020-11-17 DIAGNOSIS — S72002D Fracture of unspecified part of neck of left femur, subsequent encounter for closed fracture with routine healing: Secondary | ICD-10-CM | POA: Diagnosis not present

## 2020-11-17 DIAGNOSIS — I10 Essential (primary) hypertension: Secondary | ICD-10-CM | POA: Diagnosis not present

## 2020-11-18 ENCOUNTER — Other Ambulatory Visit: Payer: Self-pay | Admitting: Nurse Practitioner

## 2020-11-18 ENCOUNTER — Other Ambulatory Visit: Payer: Self-pay

## 2020-11-18 DIAGNOSIS — R296 Repeated falls: Secondary | ICD-10-CM

## 2020-11-18 DIAGNOSIS — Z515 Encounter for palliative care: Secondary | ICD-10-CM | POA: Diagnosis not present

## 2020-11-18 NOTE — Progress Notes (Signed)
[facility] Palliative Care Consult Note Telephone: [phone]  Fax: [phone]  PATIENT NAME: [name] [address] [place] [address] [phone] (home)  DOB: 05/26/1951 MRN: [id number]  PRIMARY CARE PROVIDER:    Maurice Small, MD,  [place] [address] [name] [address] [phone]  REFERRING PROVIDER:   Dr. Nicholas Lose (oncologist) [facility]  RESPONSIBLE PARTY:   Extended Emergency Contact Information Primary Emergency Contact: Araiza,Philip Address: [address]          [place], [place] [address] [place] Phone: [phone] Mobile Phone: [phone] Relation: Spouse  I met face to face with patient and spouse in home.  ASSESSMENT AND RECOMMENDATIONS:   1. Advance Care Planning: Today's visit consisted of building trust and discussions on palliative care medicine as a specialized medical care for people living with serious illness aimed at facilitating improved quality of life through symptoms relief, assisting with advance care planning and establishing goals of care. Patient expressed appreciation for education provided on palliative care and how it differs from Hospice service. Patient expressed relief from knowing she is not referred for Hospice care as she is hopefull for a cure for her breast cancer.   Goal of care: Patient's goal of care is function. She hopes to regain full function of her left arm so she would be able to care for her hair saying curling her hair is something she enjoys doing. She also desire to improve her balance and gait with physical therapy so she would not have to use a walking cane as much.   Directives: After discussions on advance care directive and need to have a documented code status. Patient decieded to not be resuscitated in the event of cardiac or respiratory arrest. Patient report having an unpleaseant expeirence with  resiscitation, saying her mother was resuscitated at end of her life and did not have good quality and eventually died 2 days afterwards. Discussed need to have a signed DNR form in place, patient verbalized interest. DNR form was signed and given to patient to keep at easily accessible area in her home, copy was uploaded to [facility] EMR. Discussed MOST form and need to complete the form, Blank MOST form left with patient, education material on MOST given to patient to review, we will complete the form at next visit or when patient is ready. Palliative care will continue to provide support to patient, family and the medical team.  2. Symptom Management:  Pain: Patient with left shoulder pain. Patient had left shoulder fracture from a fall on 08/23/2020. Of note patient has history of frequent falls related to left side weakness from history of spinal fusion. She is followed by ortho Dr. Ninfa Linden. Patient report pain is currently controled on Ibuprofen 847m every 8hrs as needed, report only taking it twice a day, she has a prescription for Percocet [id number] 6hrs as needed for severe pain, which she report she seldom take. Report a pain of 3/10 today, she report this as tolerable. She report sleeping well at night. Tolerating chemo without report of uncontrolled nausea, or vomiting, report good appetite. Recommendation: Recommend patient continue current pain medication regimen. Has normal renal function (Cr 0.69, GFR >60 on 10/14/2020), may have to monitor prolong use of NSAID due to its cardiovascular and renal risk. Continue PT/OT.  3. Follow up Palliative Care Visit: Palliative care will continue to follow for goals of care clarification and  symptom management. Return 4-8 weeks or prn.  4. Family /Caregiver/Community Supports:  Patient lives at home with husband, she has one daughter and 3 grandchildren. Her husband is her main caregiver.  5. Cognitive / Functional decline: Patient awake and  alert, coherent, able to make her own decisions. Patient is mostly independent with her ADLs, she requires standby assist by husband due to her gait imbalance related to left side weakness and  left shoulder fracture, needs help stepping out of the shower. She is on PT/OT for balance and strenghting. She endorsed occasional bowel and bladder incontnence episodes. Patient encouraged to use incontinent pads for support/comfort.  I spent 60 minutes providing this consultation, time includes time spent with patient and family, chart review, provider coordination, and documentation. More than 50% of the time in this consultation was spent counseling and coordinating communication.   CHIEF COMPLAINT: Initial Palliative care consult  HISTORY OF PRESENT ILLNESS:  [name] is a 69 y.o. year old female with multiple medical problems including breast cancer, depression, HTN, hypothyroidism, anxiety, left shoulder fracture from a fall on 08/23/2020, hx of spinal fusion with resultant left side weakness. Patient undergoing chemotherapy for malignant Neoplasm of upper inner quadrant of right breast estrogen receptor positive. She sustained left shoulder fracture from a fall on 08/23/2020, she is followed outpatient by Dr. Philipp Ovens. Palliative Care was asked to follow this patient by consultation request of Dr. Lindi Adie to help with pain management.   CODE STATUS: DNR  PPS: 60%  HOSPICE ELIGIBILITY/DIAGNOSIS: TBD  PHYSICAL EXAM / ROS:   Current and past weights: denied weight loss, last BMI 31.22kg/m2 General: NAD, well appearing, obese Cardiovascular: denied chest pain, +2 edema BLE, L>R  Pulmonary: no cough, no increased SOB, oxygen saturation 98% on room air GI:  appetite good, denied constipation GU: denies dysuria MSK:  Limited ROM to left shoulder joint (pt unable to lift left arm above head), ambulatory Skin: no rashes or wounds reported Neurological: Weakness, but otherwise nonfocal  PAST  MEDICAL HISTORY:  Past Medical History:  Diagnosis Date  . Anxiety   . Cancer (HCC)    breast  . Chronic pain   . Concussion 3/08   ICU x 3 days  . Depression   . Hypertension   . Hypothyroidism   . Spinal stenosis   . Thyroid disease ?1994    SOCIAL HX:  Social History   Tobacco Use  . Smoking status: Former Smoker    Types: Cigarettes  . Smokeless tobacco: Never Used  Substance Use Topics  . Alcohol use: Yes    Alcohol/week: 5.0 standard drinks    Types: 5 Standard drinks or equivalent per week    Comment: wine   FAMILY HX:  Family History  Problem Relation Age of Onset  . Thyroid disease Mother   . Dementia Mother   . Diabetes Father   . Stroke Father   . Hypertension Sister   . Diabetes Sister     ALLERGIES:  Allergies  Allergen Reactions  . Ambien [Zolpidem Tartrate] Other (See Comments)    Hallucinations and Confusion     PERTINENT MEDICATIONS:  Outpatient Encounter Medications as of 11/18/2020  Medication Sig  . amitriptyline (ELAVIL) 25 MG tablet Take 75 mg by mouth at bedtime.   Marland Kitchen amLODipine (NORVASC) 5 MG tablet Take 7.5 mg by mouth at bedtime.   . diazepam (VALIUM) 10 MG tablet TAKE 1 TABLET BY MOUTH AT BEDTIME IF NEEDED FOR SLEEP.  . furosemide (LASIX) 20  MG tablet Take 1 tablet (20 mg total) by mouth daily as needed for fluid or edema.  . IBU 800 MG tablet TAKE 1 TABLET EVERY 8 HOURS AS NEEDED.  Marland Kitchen levothyroxine (SYNTHROID, LEVOTHROID) 137 MCG tablet Take 137 mcg by mouth at bedtime.   . lidocaine-prilocaine (EMLA) cream Apply to affected area once (Patient taking differently: Apply 1 application topically daily as needed (prior to port being accessed.). Apply to affected area once)  . oxyCODONE-acetaminophen (PERCOCET/ROXICET) 5-325 MG tablet Take 1-2 tablets by mouth every 6 (six) hours as needed for severe pain.  . potassium chloride SA (KLOR-CON) 20 MEQ tablet Take 1 tablet (20 mEq total) by mouth 2 (two) times daily. Take 1 tablet 20 mEq daily    . saccharomyces boulardii (FLORASTOR) 250 MG capsule Take 1 capsule (250 mg total) by mouth 2 (two) times daily.   No facility-administered encounter medications on file as of 11/18/2020.     Jari Favre, DNP, AGPCNP-BC

## 2020-11-19 ENCOUNTER — Ambulatory Visit (INDEPENDENT_AMBULATORY_CARE_PROVIDER_SITE_OTHER): Payer: Medicare PPO | Admitting: Orthopedic Surgery

## 2020-11-19 DIAGNOSIS — S42292A Other displaced fracture of upper end of left humerus, initial encounter for closed fracture: Secondary | ICD-10-CM

## 2020-11-19 MED ORDER — HYDROCODONE-ACETAMINOPHEN 5-325 MG PO TABS
1.0000 | ORAL_TABLET | Freq: Four times a day (QID) | ORAL | 0 refills | Status: DC | PRN
Start: 2020-11-19 — End: 2020-12-04

## 2020-11-23 ENCOUNTER — Encounter: Payer: Self-pay | Admitting: Orthopedic Surgery

## 2020-11-23 NOTE — Progress Notes (Signed)
Office Visit Note   Patient: [name]           Date of Birth: 1951-01-06           MRN: [id number] Visit Date: 11/19/2020 Requested by: Maurice Small, MD [address] [place],  [place] [address] PCP: Maurice Small, MD  Subjective: Chief Complaint  Patient presents with  . Left Shoulder - Pain    HPI: [name] is a 69 year old patient with left shoulder pain.  She had a fall 08/20/2020.  This affected her left shoulder with a 2 part proximal humerus fracture.  She is right-hand dominant.  Reports continued pain and decreased range of motion.  Plain radiographs demonstrate nonunion or at least fibrous union of the fracture with scalloping of the humeral head.  She reports significant pain and diminished function in the left shoulder.  Last vitamin D level upon review of the records was 2014 which was 27 at that time.              ROS: All systems reviewed are negative as they relate to the chief complaint within the history of present illness.  Patient denies  fevers or chills.   Assessment & Plan: Visit Diagnoses:  1. Closed 3-part fracture of proximal humerus, left, initial encounter     Plan: Impression is nonunion left proximal humerus fracture in a patient who likely has metabolic abnormalities.  She has significantly diminished pain and diminished function.  She would be a good candidate for reverse shoulder replacement to improve pain relief as well as function.  Risk and benefits are discussed with the patient including but not limited to infection nerve vessel damage dislocation incomplete pain relief as well as incomplete restoration of function.  Patient does have functional deltoid at this time.  Patient understands risk benefits and wishes to proceed.  Thin cut CT scan pending for preop as patient specific instrumentation and planning.  Patient also needs vitamin D level checked and optimized prior to surgery as this may require tuberosity fixation for the  implant.  Follow-Up Instructions: No follow-ups on file.   Orders:  Orders Placed This Encounter  Procedures  . CT SHOULDER LEFT WO CONTRAST   Meds ordered this encounter  Medications  . HYDROcodone-acetaminophen (NORCO/VICODIN) 5-325 MG tablet    Sig: Take 1 tablet by mouth every 6 (six) hours as needed for moderate pain.    Dispense:  30 tablet    Refill:  0      Procedures: No procedures performed   Clinical Data: No additional findings.  Objective: Vital Signs: LMP  (LMP Unknown)   Physical Exam:   Constitutional: Patient appears well-developed HEENT:  Head: Normocephalic Eyes:EOM are normal Neck: Normal range of motion Cardiovascular: Normal rate Pulmonary/chest: Effort normal Neurologic: Patient is alert Skin: Skin is warm Psychiatric: Patient has normal mood and affect    Ortho Exam: Ortho exam demonstrates diminished forward flexion abduction on the left-hand side.  Patient does have some pain with range of motion but external rotation of 15 degrees of abduction is about 40 degrees.  Rotator cuff function is predictably weak to infraspinatus supraspinatus and subscap muscle function.  No paresthesias in the arm.  Skin is intact in that left shoulder girdle region.  Passive range of motion is painful but maintained up to about 90 degrees of forward flexion and abduction.  Actively she is much less than that.  Specialty Comments:  No specialty comments available.  Imaging: No results  found.   PMFS History: Patient Active Problem List   Diagnosis Date Noted  . Port-A-Cath in place 07/22/2020  . Left leg cellulitis 04/17/2020  . Cellulitis of left leg 04/17/2020  . Hypokalemia 04/17/2020  . Macrocytic anemia 04/17/2020  . Anxiety 04/17/2020  . Depression 04/17/2020  . Chronic diarrhea 04/17/2020  . Chemotherapy induced diarrhea 04/17/2020  . Malignant neoplasm of upper-inner quadrant of right breast in female, estrogen receptor positive ([facility])  12/26/2019  . Encephalopathy 02/02/2018  . Hypothyroidism 02/02/2018  . AKI (acute kidney injury) ([facility]) 02/02/2018  . Chronic back pain 02/02/2018  . Alcohol abuse 02/02/2018  . Weight gain 02/02/2018  . Benign essential HTN 02/02/2018   Past Medical History:  Diagnosis Date  . Anxiety   . Cancer (HCC)    breast  . Chronic pain   . Concussion 3/08   ICU x 3 days  . Depression   . Hypertension   . Hypothyroidism   . Spinal stenosis   . Thyroid disease ?1994    Family History  Problem Relation Age of Onset  . Thyroid disease Mother   . Dementia Mother   . Diabetes Father   . Stroke Father   . Hypertension Sister   . Diabetes Sister     Past Surgical History:  Procedure Laterality Date  . BREAST LUMPECTOMY WITH RADIOACTIVE SEED AND SENTINEL LYMPH NODE BIOPSY Right 01/22/2020   Procedure: RIGHT BREAST LUMPECTOMY WITH RADIOACTIVE SEED X2 AND RIGHT SENTINEL LYMPH NODE MAPPING;  Surgeon: Erroll Luna, MD;  Location: [facility];  Service: General;  Laterality: Right;  . BREAST SURGERY Right 4/99   breast biopsy, benign  . PORTACATH PLACEMENT Right 01/22/2020   Procedure: INSERTION PORT-A-CATH WITH ULTRASOUND;  Surgeon: Erroll Luna, MD;  Location: [facility];  Service: General;  Laterality: Right;  . PORTACATH PLACEMENT Right 02/28/2020   Procedure: PORT A CATH REVISION;  Surgeon: Erroll Luna, MD;  Location: [facility];  Service: General;  Laterality: Right;  . SPINAL FUSION  03/04/11   with ORIF   Social History   Occupational History  . Not on file  Tobacco Use  . Smoking status: Former Smoker    Types: Cigarettes  . Smokeless tobacco: Never Used  Vaping Use  . Vaping Use: Never used  Substance and Sexual Activity  . Alcohol use: Yes    Alcohol/week: 5.0 standard drinks    Types: 5 Standard drinks or equivalent per week    Comment: wine  . Drug use: No  . Sexual activity: Never    Partners: Male    Birth control/protection:  Post-menopausal

## 2020-11-24 NOTE — Progress Notes (Signed)
I called and spoke with patient about this and she verbalized understanding and importance. However, she stated she wanted to wait on having labs drawn until she comes this way to have her CT scan done. She said it was too much driving and too many trips to [place] to get all this done and would rather wait to have labs drawn the same day she has to have CT scan done.  She mentioned you guys had been playing phone tag about getting her scheduled for CT scan? She is asking for you to call her at [phone]

## 2020-11-25 ENCOUNTER — Inpatient Hospital Stay: Payer: Medicare PPO

## 2020-11-25 ENCOUNTER — Other Ambulatory Visit: Payer: Self-pay

## 2020-11-25 ENCOUNTER — Inpatient Hospital Stay: Payer: Medicare PPO | Attending: Hematology and Oncology

## 2020-11-25 ENCOUNTER — Encounter: Payer: Self-pay | Admitting: *Deleted

## 2020-11-25 ENCOUNTER — Ambulatory Visit: Payer: Medicare PPO | Admitting: Hematology and Oncology

## 2020-11-25 VITALS — BP 143/90 | HR 87 | Temp 98.5°F | Resp 18

## 2020-11-25 DIAGNOSIS — C50211 Malignant neoplasm of upper-inner quadrant of right female breast: Secondary | ICD-10-CM | POA: Insufficient documentation

## 2020-11-25 DIAGNOSIS — Z170 Estrogen receptor positive status [ER+]: Secondary | ICD-10-CM

## 2020-11-25 DIAGNOSIS — Z5112 Encounter for antineoplastic immunotherapy: Secondary | ICD-10-CM | POA: Insufficient documentation

## 2020-11-25 DIAGNOSIS — Z95828 Presence of other vascular implants and grafts: Secondary | ICD-10-CM

## 2020-11-25 LAB — CBC WITH DIFFERENTIAL (CANCER CENTER ONLY)
Abs Immature Granulocytes: 0.03 10*3/uL (ref 0.00–0.07)
Basophils Absolute: 0 10*3/uL (ref 0.0–0.1)
Basophils Relative: 0 %
Eosinophils Absolute: 0.1 10*3/uL (ref 0.0–0.5)
Eosinophils Relative: 2 %
HCT: 35.1 % — ABNORMAL LOW (ref 36.0–46.0)
Hemoglobin: 11.6 g/dL — ABNORMAL LOW (ref 12.0–15.0)
Immature Granulocytes: 0 %
Lymphocytes Relative: 18 %
Lymphs Abs: 1.3 10*3/uL (ref 0.7–4.0)
MCH: 32 pg (ref 26.0–34.0)
MCHC: 33 g/dL (ref 30.0–36.0)
MCV: 96.7 fL (ref 80.0–100.0)
Monocytes Absolute: 0.5 10*3/uL (ref 0.1–1.0)
Monocytes Relative: 7 %
Neutro Abs: 5 10*3/uL (ref 1.7–7.7)
Neutrophils Relative %: 73 %
Platelet Count: 216 10*3/uL (ref 150–400)
RBC: 3.63 MIL/uL — ABNORMAL LOW (ref 3.87–5.11)
RDW: 14.9 % (ref 11.5–15.5)
WBC Count: 6.9 10*3/uL (ref 4.0–10.5)
nRBC: 0 % (ref 0.0–0.2)

## 2020-11-25 LAB — CMP (CANCER CENTER ONLY)
ALT: 22 U/L (ref 0–44)
AST: 16 U/L (ref 15–41)
Albumin: 3.3 g/dL — ABNORMAL LOW (ref 3.5–5.0)
Alkaline Phosphatase: 103 U/L (ref 38–126)
Anion gap: 8 (ref 5–15)
BUN: 15 mg/dL (ref 8–23)
CO2: 28 mmol/L (ref 22–32)
Calcium: 8.6 mg/dL — ABNORMAL LOW (ref 8.9–10.3)
Chloride: 104 mmol/L (ref 98–111)
Creatinine: 0.7 mg/dL (ref 0.44–1.00)
GFR, Estimated: >60 mL/min (ref >60)
Glucose, Bld: 86 mg/dL (ref 70–99)
Potassium: 3.9 mmol/L (ref 3.5–5.1)
Sodium: 140 mmol/L (ref 135–145)
Total Bilirubin: 0.3 mg/dL (ref 0.3–1.2)
Total Protein: 6.4 g/dL — ABNORMAL LOW (ref 6.5–8.1)

## 2020-11-25 MED ORDER — SODIUM CHLORIDE 0.9% FLUSH
10.0000 mL | Freq: Once | INTRAVENOUS | Status: AC
  Administered 2020-11-25: 10 mL
  Filled 2020-11-25: qty 10

## 2020-11-25 MED ORDER — DIPHENHYDRAMINE HCL 25 MG PO CAPS
ORAL_CAPSULE | ORAL | Status: AC
  Filled 2020-11-25: qty 1

## 2020-11-25 MED ORDER — SODIUM CHLORIDE 0.9% FLUSH
10.0000 mL | INTRAVENOUS | Status: DC | PRN
  Administered 2020-11-25: 10 mL
  Filled 2020-11-25: qty 10

## 2020-11-25 MED ORDER — HEPARIN SOD (PORK) LOCK FLUSH 100 UNIT/ML IV SOLN
500.0000 [IU] | Freq: Once | INTRAVENOUS | Status: AC | PRN
  Administered 2020-11-25: 500 [IU]
  Filled 2020-11-25: qty 5

## 2020-11-25 MED ORDER — SODIUM CHLORIDE 0.9 % IV SOLN
Freq: Once | INTRAVENOUS | Status: AC
  Filled 2020-11-25: qty 250

## 2020-11-25 MED ORDER — ACETAMINOPHEN 325 MG PO TABS
650.0000 mg | ORAL_TABLET | Freq: Once | ORAL | Status: AC
  Administered 2020-11-25: 650 mg via ORAL

## 2020-11-25 MED ORDER — TRASTUZUMAB-ANNS CHEMO 150 MG IV SOLR
6.0000 mg/kg | Freq: Once | INTRAVENOUS | Status: AC
  Administered 2020-11-25: 546 mg via INTRAVENOUS
  Filled 2020-11-25: qty 26

## 2020-11-25 MED ORDER — DIPHENHYDRAMINE HCL 25 MG PO CAPS
50.0000 mg | ORAL_CAPSULE | Freq: Once | ORAL | Status: AC
  Administered 2020-11-25: 50 mg via ORAL

## 2020-11-25 MED ORDER — ACETAMINOPHEN 325 MG PO TABS
ORAL_TABLET | ORAL | Status: AC
  Filled 2020-11-25: qty 2

## 2020-11-25 NOTE — Patient Instructions (Signed)
[facility] Discharge Instructions for Patients Receiving Chemotherapy  Today you received the following chemotherapy agents Trastuzumab-anns  To help prevent nausea and vomiting after your treatment, we encourage you to take your nausea medication as directed.   If you develop nausea and vomiting that is not controlled by your nausea medication, call the clinic.   BELOW ARE SYMPTOMS THAT SHOULD BE REPORTED IMMEDIATELY:  *FEVER GREATER THAN 100.5 F  *CHILLS WITH OR WITHOUT FEVER  NAUSEA AND VOMITING THAT IS NOT CONTROLLED WITH YOUR NAUSEA MEDICATION  *UNUSUAL SHORTNESS OF BREATH  *UNUSUAL BRUISING OR BLEEDING  TENDERNESS IN MOUTH AND THROAT WITH OR WITHOUT PRESENCE OF ULCERS  *URINARY PROBLEMS  *BOWEL PROBLEMS  UNUSUAL RASH Items with * indicate a potential emergency and should be followed up as soon as possible.  Feel free to call the clinic should you have any questions or concerns. The clinic phone number is [phone].  Please show the [facility] at check-in to the Emergency Department and triage nurse.

## 2020-11-27 DIAGNOSIS — G43909 Migraine, unspecified, not intractable, without status migrainosus: Secondary | ICD-10-CM | POA: Diagnosis not present

## 2020-11-27 DIAGNOSIS — F419 Anxiety disorder, unspecified: Secondary | ICD-10-CM | POA: Diagnosis not present

## 2020-11-27 DIAGNOSIS — S72002D Fracture of unspecified part of neck of left femur, subsequent encounter for closed fracture with routine healing: Secondary | ICD-10-CM | POA: Diagnosis not present

## 2020-11-27 DIAGNOSIS — M4800 Spinal stenosis, site unspecified: Secondary | ICD-10-CM | POA: Diagnosis not present

## 2020-11-27 DIAGNOSIS — I10 Essential (primary) hypertension: Secondary | ICD-10-CM | POA: Diagnosis not present

## 2020-11-27 DIAGNOSIS — D539 Nutritional anemia, unspecified: Secondary | ICD-10-CM | POA: Diagnosis not present

## 2020-11-27 DIAGNOSIS — C50211 Malignant neoplasm of upper-inner quadrant of right female breast: Secondary | ICD-10-CM | POA: Diagnosis not present

## 2020-11-27 DIAGNOSIS — F5101 Primary insomnia: Secondary | ICD-10-CM | POA: Diagnosis not present

## 2020-11-27 DIAGNOSIS — S42202D Unspecified fracture of upper end of left humerus, subsequent encounter for fracture with routine healing: Secondary | ICD-10-CM | POA: Diagnosis not present

## 2020-12-01 DIAGNOSIS — I10 Essential (primary) hypertension: Secondary | ICD-10-CM | POA: Diagnosis not present

## 2020-12-01 DIAGNOSIS — G43909 Migraine, unspecified, not intractable, without status migrainosus: Secondary | ICD-10-CM | POA: Diagnosis not present

## 2020-12-01 DIAGNOSIS — F5101 Primary insomnia: Secondary | ICD-10-CM | POA: Diagnosis not present

## 2020-12-01 DIAGNOSIS — S42202D Unspecified fracture of upper end of left humerus, subsequent encounter for fracture with routine healing: Secondary | ICD-10-CM | POA: Diagnosis not present

## 2020-12-01 DIAGNOSIS — D539 Nutritional anemia, unspecified: Secondary | ICD-10-CM | POA: Diagnosis not present

## 2020-12-01 DIAGNOSIS — F419 Anxiety disorder, unspecified: Secondary | ICD-10-CM | POA: Diagnosis not present

## 2020-12-01 DIAGNOSIS — C50211 Malignant neoplasm of upper-inner quadrant of right female breast: Secondary | ICD-10-CM | POA: Diagnosis not present

## 2020-12-01 DIAGNOSIS — S72002D Fracture of unspecified part of neck of left femur, subsequent encounter for closed fracture with routine healing: Secondary | ICD-10-CM | POA: Diagnosis not present

## 2020-12-01 DIAGNOSIS — M4800 Spinal stenosis, site unspecified: Secondary | ICD-10-CM | POA: Diagnosis not present

## 2020-12-04 ENCOUNTER — Other Ambulatory Visit: Payer: Self-pay | Admitting: Hematology and Oncology

## 2020-12-04 ENCOUNTER — Other Ambulatory Visit: Payer: Self-pay | Admitting: Orthopaedic Surgery

## 2020-12-04 ENCOUNTER — Telehealth: Payer: Self-pay | Admitting: Orthopaedic Surgery

## 2020-12-04 MED ORDER — HYDROCODONE-ACETAMINOPHEN 5-325 MG PO TABS
1.0000 | ORAL_TABLET | Freq: Four times a day (QID) | ORAL | 0 refills | Status: DC | PRN
Start: 2020-12-04 — End: 2020-12-12

## 2020-12-04 NOTE — Telephone Encounter (Signed)
Patient called requesting a refill of oxycodone. Please send to pharmacy on file. Patient phone number is [phone].

## 2020-12-04 NOTE — Telephone Encounter (Signed)
LMOM of the below message for patient  

## 2020-12-04 NOTE — Telephone Encounter (Signed)
I actually sent in some hydrocodone for her because that is what we had her on last.

## 2020-12-04 NOTE — Telephone Encounter (Signed)
Please advise 

## 2020-12-05 ENCOUNTER — Ambulatory Visit
Admission: RE | Admit: 2020-12-05 | Discharge: 2020-12-05 | Disposition: A | Discharge status: Home / Self Care | Payer: Medicare PPO | Source: Ambulatory Visit | Attending: Orthopedic Surgery | Admitting: Orthopedic Surgery

## 2020-12-05 ENCOUNTER — Other Ambulatory Visit: Payer: Self-pay

## 2020-12-05 DIAGNOSIS — M6258 Muscle wasting and atrophy, not elsewhere classified, other site: Secondary | ICD-10-CM | POA: Diagnosis not present

## 2020-12-05 DIAGNOSIS — S42212A Unspecified displaced fracture of surgical neck of left humerus, initial encounter for closed fracture: Secondary | ICD-10-CM | POA: Diagnosis not present

## 2020-12-05 DIAGNOSIS — S42292A Other displaced fracture of upper end of left humerus, initial encounter for closed fracture: Secondary | ICD-10-CM

## 2020-12-05 DIAGNOSIS — M25512 Pain in left shoulder: Secondary | ICD-10-CM | POA: Diagnosis not present

## 2020-12-08 DIAGNOSIS — S42202D Unspecified fracture of upper end of left humerus, subsequent encounter for fracture with routine healing: Secondary | ICD-10-CM | POA: Diagnosis not present

## 2020-12-08 DIAGNOSIS — D539 Nutritional anemia, unspecified: Secondary | ICD-10-CM | POA: Diagnosis not present

## 2020-12-08 DIAGNOSIS — S72002D Fracture of unspecified part of neck of left femur, subsequent encounter for closed fracture with routine healing: Secondary | ICD-10-CM | POA: Diagnosis not present

## 2020-12-08 DIAGNOSIS — I10 Essential (primary) hypertension: Secondary | ICD-10-CM | POA: Diagnosis not present

## 2020-12-08 DIAGNOSIS — F5101 Primary insomnia: Secondary | ICD-10-CM | POA: Diagnosis not present

## 2020-12-08 DIAGNOSIS — G43909 Migraine, unspecified, not intractable, without status migrainosus: Secondary | ICD-10-CM | POA: Diagnosis not present

## 2020-12-08 DIAGNOSIS — F419 Anxiety disorder, unspecified: Secondary | ICD-10-CM | POA: Diagnosis not present

## 2020-12-08 DIAGNOSIS — C50211 Malignant neoplasm of upper-inner quadrant of right female breast: Secondary | ICD-10-CM | POA: Diagnosis not present

## 2020-12-08 DIAGNOSIS — M4800 Spinal stenosis, site unspecified: Secondary | ICD-10-CM | POA: Diagnosis not present

## 2020-12-08 NOTE — Progress Notes (Signed)
Blue sheet done.  Also needs vitamin D.  Thanks

## 2020-12-08 NOTE — Progress Notes (Signed)
Can you call and tell her we will post she does have nonunion and also neds vit d check thx - also can you send back to me to do blue sheet thx

## 2020-12-12 ENCOUNTER — Other Ambulatory Visit: Payer: Self-pay

## 2020-12-12 ENCOUNTER — Emergency Department (HOSPITAL_COMMUNITY): Payer: Medicare PPO

## 2020-12-12 ENCOUNTER — Encounter (HOSPITAL_COMMUNITY): Payer: Self-pay | Admitting: Emergency Medicine

## 2020-12-12 ENCOUNTER — Emergency Department (HOSPITAL_COMMUNITY)
Admission: EM | Admit: 2020-12-12 | Discharge: 2020-12-13 | Disposition: A | Discharge status: Home / Self Care | Payer: Medicare PPO | Attending: Emergency Medicine | Admitting: Emergency Medicine

## 2020-12-12 DIAGNOSIS — S0093XA Contusion of unspecified part of head, initial encounter: Secondary | ICD-10-CM | POA: Diagnosis not present

## 2020-12-12 DIAGNOSIS — M47812 Spondylosis without myelopathy or radiculopathy, cervical region: Secondary | ICD-10-CM | POA: Diagnosis not present

## 2020-12-12 DIAGNOSIS — Z853 Personal history of malignant neoplasm of breast: Secondary | ICD-10-CM | POA: Insufficient documentation

## 2020-12-12 DIAGNOSIS — E039 Hypothyroidism, unspecified: Secondary | ICD-10-CM | POA: Diagnosis not present

## 2020-12-12 DIAGNOSIS — Z79899 Other long term (current) drug therapy: Secondary | ICD-10-CM | POA: Diagnosis not present

## 2020-12-12 DIAGNOSIS — R451 Restlessness and agitation: Secondary | ICD-10-CM

## 2020-12-12 DIAGNOSIS — Z046 Encounter for general psychiatric examination, requested by authority: Secondary | ICD-10-CM | POA: Diagnosis not present

## 2020-12-12 DIAGNOSIS — F4321 Adjustment disorder with depressed mood: Secondary | ICD-10-CM | POA: Diagnosis not present

## 2020-12-12 DIAGNOSIS — Z20822 Contact with and (suspected) exposure to covid-19: Secondary | ICD-10-CM | POA: Diagnosis not present

## 2020-12-12 DIAGNOSIS — I10 Essential (primary) hypertension: Secondary | ICD-10-CM | POA: Diagnosis not present

## 2020-12-12 DIAGNOSIS — R4589 Other symptoms and signs involving emotional state: Secondary | ICD-10-CM

## 2020-12-12 DIAGNOSIS — R4182 Altered mental status, unspecified: Secondary | ICD-10-CM | POA: Diagnosis not present

## 2020-12-12 DIAGNOSIS — Z87891 Personal history of nicotine dependence: Secondary | ICD-10-CM | POA: Diagnosis not present

## 2020-12-12 DIAGNOSIS — Z9011 Acquired absence of right breast and nipple: Secondary | ICD-10-CM | POA: Insufficient documentation

## 2020-12-12 DIAGNOSIS — S0990XA Unspecified injury of head, initial encounter: Secondary | ICD-10-CM | POA: Diagnosis not present

## 2020-12-12 LAB — COMPREHENSIVE METABOLIC PANEL
ALT: 26 U/L (ref 0–44)
AST: 23 U/L (ref 15–41)
Albumin: 3.6 g/dL (ref 3.5–5.0)
Alkaline Phosphatase: 80 U/L (ref 38–126)
Anion gap: 12 (ref 5–15)
BUN: 18 mg/dL (ref 8–23)
CO2: 25 mmol/L (ref 22–32)
Calcium: 8.7 mg/dL — ABNORMAL LOW (ref 8.9–10.3)
Chloride: 107 mmol/L (ref 98–111)
Creatinine, Ser: 0.64 mg/dL (ref 0.44–1.00)
GFR, Estimated: >60 mL/min (ref >60)
Glucose, Bld: 103 mg/dL — ABNORMAL HIGH (ref 70–99)
Potassium: 3.3 mmol/L — ABNORMAL LOW (ref 3.5–5.1)
Sodium: 144 mmol/L (ref 135–145)
Total Bilirubin: 0.3 mg/dL (ref 0.3–1.2)
Total Protein: 6.7 g/dL (ref 6.5–8.1)

## 2020-12-12 LAB — CBC
HCT: 38.2 % (ref 36.0–46.0)
Hemoglobin: 12.4 g/dL (ref 12.0–15.0)
MCH: 32.6 pg (ref 26.0–34.0)
MCHC: 32.5 g/dL (ref 30.0–36.0)
MCV: 100.5 fL — ABNORMAL HIGH (ref 80.0–100.0)
Platelets: 220 10*3/uL (ref 150–400)
RBC: 3.8 MIL/uL — ABNORMAL LOW (ref 3.87–5.11)
RDW: 15.7 % — ABNORMAL HIGH (ref 11.5–15.5)
WBC: 5.7 10*3/uL (ref 4.0–10.5)
nRBC: 0 % (ref 0.0–0.2)

## 2020-12-12 LAB — RAPID URINE DRUG SCREEN, HOSP PERFORMED
Amphetamines: NOT DETECTED
Barbiturates: NOT DETECTED
Benzodiazepines: POSITIVE — AB
Cocaine: NOT DETECTED
Opiates: NOT DETECTED
Tetrahydrocannabinol: NOT DETECTED

## 2020-12-12 LAB — ETHANOL: Alcohol, Ethyl (B): <10 mg/dL (ref <10)

## 2020-12-12 MED ORDER — DIAZEPAM 5 MG PO TABS
10.0000 mg | ORAL_TABLET | Freq: Every evening | ORAL | Status: DC | PRN
  Administered 2020-12-13: 10 mg via ORAL
  Filled 2020-12-12: qty 2

## 2020-12-12 MED ORDER — IBUPROFEN 800 MG PO TABS: 800.0000 mg | ORAL_TABLET | Freq: Four times a day (QID) | ORAL | Status: DC | PRN

## 2020-12-12 MED ORDER — HYDROCODONE-ACETAMINOPHEN 5-325 MG PO TABS
1.0000 | ORAL_TABLET | Freq: Four times a day (QID) | ORAL | Status: DC | PRN
  Administered 2020-12-13: 2 via ORAL
  Administered 2020-12-13 (×2): 1 via ORAL
  Filled 2020-12-12: qty 2
  Filled 2020-12-12 (×2): qty 1

## 2020-12-12 MED ORDER — LEVOTHYROXINE SODIUM 137 MCG PO TABS
137.0000 ug | ORAL_TABLET | Freq: Every day | ORAL | Status: DC
  Administered 2020-12-13: 137 ug via ORAL
  Filled 2020-12-12: qty 1

## 2020-12-12 MED ORDER — AMLODIPINE BESYLATE 5 MG PO TABS
7.5000 mg | ORAL_TABLET | Freq: Every day | ORAL | Status: DC
  Administered 2020-12-13: 7.5 mg via ORAL
  Filled 2020-12-12: qty 2
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
## 2020-12-13 LAB — RESP PANEL BY RT-PCR (FLU A&B, COVID) ARPGX2
Influenza A by PCR: NEGATIVE
Influenza B by PCR: NEGATIVE
SARS Coronavirus 2 by RT PCR: NEGATIVE
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
## 2020-12-13 NOTE — BH Assessment (Signed)
Clinician sent Lysbeth Galas, RN a message via secure chat in Epic to see if the pt can engage in the TTS assessment.    Vertell Novak, [place], [facility], [facility] Triage Specialist [phone]

## 2020-12-13 NOTE — BH Assessment (Signed)
Per Lysbeth Galas, RN nurse secretary to fax pt's IVC paperwork. Once IVC paperwork is received pt to be to assessed if private room is available.    Vertell Novak, [place], [facility], [facility] Triage Specialist [phone]

## 2020-12-13 NOTE — ED Notes (Signed)
Patient husband called to pick up patient for discharge.

## 2020-12-13 NOTE — ED Notes (Signed)
[name], PMHNP recommends patient to be observed and reassessed by psych this morning.

## 2020-12-13 NOTE — BH Assessment (Signed)
Clinician sent RN a message, IVC paperwork is received, pt to placed in a private room for assessment.    Vertell Novak, [place], [facility], [facility] Triage Specialist [phone]

## 2020-12-13 NOTE — ED Provider Notes (Signed)
[name], TTS has evaluated patient.  They want patient to be evaluated later this morning by the psychiatrist.   Rolland Porter, MD 12/13/20 0500

## 2020-12-13 NOTE — BH Assessment (Addendum)
Patient was seen this date to evaluate/assess current mental health progress. Patient denies any S/I, H/I or AVH. Patient is requesting to be discharged and contracts for safety. Patient gives consent to contact her husband Doren Custard [phone] who renders collateral. Husband reports that he has no safety concerns and will assist with transportation when patient is discharged. Per [place] NP patient will be discharged later this date and provided with resources for follow up in the community.

## 2020-12-13 NOTE — ED Provider Notes (Signed)
Patient cleared by psych and discharged.   Lennice Sites, DO 12/13/20 1245

## 2020-12-13 NOTE — ED Notes (Signed)
Pt belongings placed in cabinet behind nurses station on recess side

## 2020-12-13 NOTE — ED Notes (Addendum)
TTS at bedside. 

## 2020-12-15 DIAGNOSIS — D539 Nutritional anemia, unspecified: Secondary | ICD-10-CM | POA: Diagnosis not present

## 2020-12-15 DIAGNOSIS — S72002D Fracture of unspecified part of neck of left femur, subsequent encounter for closed fracture with routine healing: Secondary | ICD-10-CM | POA: Diagnosis not present

## 2020-12-15 DIAGNOSIS — M4800 Spinal stenosis, site unspecified: Secondary | ICD-10-CM | POA: Diagnosis not present

## 2020-12-15 DIAGNOSIS — I10 Essential (primary) hypertension: Secondary | ICD-10-CM | POA: Diagnosis not present

## 2020-12-15 DIAGNOSIS — G43909 Migraine, unspecified, not intractable, without status migrainosus: Secondary | ICD-10-CM | POA: Diagnosis not present

## 2020-12-15 DIAGNOSIS — C50211 Malignant neoplasm of upper-inner quadrant of right female breast: Secondary | ICD-10-CM | POA: Diagnosis not present

## 2020-12-15 DIAGNOSIS — S42202D Unspecified fracture of upper end of left humerus, subsequent encounter for fracture with routine healing: Secondary | ICD-10-CM | POA: Diagnosis not present

## 2020-12-15 DIAGNOSIS — F5101 Primary insomnia: Secondary | ICD-10-CM | POA: Diagnosis not present

## 2020-12-15 DIAGNOSIS — F419 Anxiety disorder, unspecified: Secondary | ICD-10-CM | POA: Diagnosis not present

## 2020-12-16 ENCOUNTER — Other Ambulatory Visit: Payer: Self-pay

## 2020-12-16 ENCOUNTER — Inpatient Hospital Stay: Payer: Medicare PPO

## 2020-12-16 VITALS — BP 158/71 | HR 82 | Temp 98.5°F | Resp 18

## 2020-12-16 DIAGNOSIS — Z170 Estrogen receptor positive status [ER+]: Secondary | ICD-10-CM | POA: Diagnosis not present

## 2020-12-16 DIAGNOSIS — Z5112 Encounter for antineoplastic immunotherapy: Secondary | ICD-10-CM | POA: Diagnosis not present

## 2020-12-16 DIAGNOSIS — C50211 Malignant neoplasm of upper-inner quadrant of right female breast: Secondary | ICD-10-CM

## 2020-12-16 MED ORDER — DIPHENHYDRAMINE HCL 25 MG PO CAPS
50.0000 mg | ORAL_CAPSULE | Freq: Once | ORAL | Status: AC
  Administered 2020-12-16: 50 mg via ORAL

## 2020-12-16 MED ORDER — SODIUM CHLORIDE 0.9% FLUSH
10.0000 mL | INTRAVENOUS | Status: DC | PRN
  Administered 2020-12-16: 10 mL
  Filled 2020-12-16: qty 10

## 2020-12-16 MED ORDER — OXYCODONE HCL 5 MG PO TABS
10.0000 mg | ORAL_TABLET | Freq: Once | ORAL | Status: AC
  Administered 2020-12-16: 10 mg via ORAL

## 2020-12-16 MED ORDER — ACETAMINOPHEN 325 MG PO TABS
650.0000 mg | ORAL_TABLET | Freq: Once | ORAL | Status: AC
  Administered 2020-12-16: 650 mg via ORAL

## 2020-12-16 MED ORDER — SODIUM CHLORIDE 0.9 % IV SOLN
Freq: Once | INTRAVENOUS | Status: AC
  Filled 2020-12-16: qty 250

## 2020-12-16 MED ORDER — ACETAMINOPHEN 325 MG PO TABS
ORAL_TABLET | ORAL | Status: AC
  Filled 2020-12-16: qty 2

## 2020-12-16 MED ORDER — HEPARIN SOD (PORK) LOCK FLUSH 100 UNIT/ML IV SOLN
500.0000 [IU] | Freq: Once | INTRAVENOUS | Status: AC | PRN
  Administered 2020-12-16: 500 [IU]
  Filled 2020-12-16: qty 5

## 2020-12-16 MED ORDER — TRASTUZUMAB-ANNS CHEMO 150 MG IV SOLR
6.0000 mg/kg | Freq: Once | INTRAVENOUS | Status: AC
  Administered 2020-12-16: 546 mg via INTRAVENOUS
  Filled 2020-12-16: qty 26

## 2020-12-16 MED ORDER — DIPHENHYDRAMINE HCL 25 MG PO CAPS
ORAL_CAPSULE | ORAL | Status: AC
  Filled 2020-12-16: qty 2

## 2020-12-16 MED ORDER — OXYCODONE HCL 5 MG PO TABS
ORAL_TABLET | ORAL | Status: AC
  Filled 2020-12-16: qty 2

## 2020-12-16 NOTE — Patient Instructions (Signed)
[facility] Discharge Instructions for Patients Receiving Chemotherapy  Today you received the following chemotherapy agents: Trastuzumab (Herceptin).  To help prevent nausea and vomiting after your treatment, we encourage you to take your nausea medication as prescribed. If you develop nausea and vomiting that is not controlled by your nausea medication, call the clinic.   BELOW ARE SYMPTOMS THAT SHOULD BE REPORTED IMMEDIATELY:  *FEVER GREATER THAN 100.5 F  *CHILLS WITH OR WITHOUT FEVER  NAUSEA AND VOMITING THAT IS NOT CONTROLLED WITH YOUR NAUSEA MEDICATION  *UNUSUAL SHORTNESS OF BREATH  *UNUSUAL BRUISING OR BLEEDING  TENDERNESS IN MOUTH AND THROAT WITH OR WITHOUT PRESENCE OF ULCERS  *URINARY PROBLEMS  *BOWEL PROBLEMS  UNUSUAL RASH Items with * indicate a potential emergency and should be followed up as soon as possible.  Feel free to call the clinic should you have any questions or concerns. The clinic phone number is [phone].  Please show the CHEMO ALERT CARD at check-in to the Emergency Department and triage nurse.   

## 2020-12-17 NOTE — Progress Notes (Signed)
Ok thx.

## 2020-12-22 DIAGNOSIS — B351 Tinea unguium: Secondary | ICD-10-CM | POA: Diagnosis not present

## 2020-12-23 DIAGNOSIS — S42202D Unspecified fracture of upper end of left humerus, subsequent encounter for fracture with routine healing: Secondary | ICD-10-CM | POA: Diagnosis not present

## 2020-12-23 DIAGNOSIS — G43909 Migraine, unspecified, not intractable, without status migrainosus: Secondary | ICD-10-CM | POA: Diagnosis not present

## 2020-12-23 DIAGNOSIS — D539 Nutritional anemia, unspecified: Secondary | ICD-10-CM | POA: Diagnosis not present

## 2020-12-23 DIAGNOSIS — I10 Essential (primary) hypertension: Secondary | ICD-10-CM | POA: Diagnosis not present

## 2020-12-23 DIAGNOSIS — M4800 Spinal stenosis, site unspecified: Secondary | ICD-10-CM | POA: Diagnosis not present

## 2020-12-23 DIAGNOSIS — C50211 Malignant neoplasm of upper-inner quadrant of right female breast: Secondary | ICD-10-CM | POA: Diagnosis not present

## 2020-12-23 DIAGNOSIS — F5101 Primary insomnia: Secondary | ICD-10-CM | POA: Diagnosis not present

## 2020-12-23 DIAGNOSIS — F419 Anxiety disorder, unspecified: Secondary | ICD-10-CM | POA: Diagnosis not present

## 2020-12-23 DIAGNOSIS — S72002D Fracture of unspecified part of neck of left femur, subsequent encounter for closed fracture with routine healing: Secondary | ICD-10-CM | POA: Diagnosis not present

## 2020-12-24 ENCOUNTER — Ambulatory Visit: Payer: Medicare PPO | Admitting: Orthopedic Surgery

## 2020-12-24 DIAGNOSIS — E559 Vitamin D deficiency, unspecified: Secondary | ICD-10-CM | POA: Diagnosis not present

## 2020-12-24 DIAGNOSIS — S42292A Other displaced fracture of upper end of left humerus, initial encounter for closed fracture: Secondary | ICD-10-CM

## 2020-12-26 ENCOUNTER — Other Ambulatory Visit: Payer: Self-pay | Admitting: Orthopedic Surgery

## 2020-12-26 LAB — VITAMIN D 25 HYDROXY (VIT D DEFICIENCY, FRACTURES): Vit D, 25-Hydroxy: 20 ng/mL — ABNORMAL LOW (ref 30–100)

## 2020-12-26 LAB — ALBUMIN: Albumin: 4.1 g/dL (ref 3.6–5.1)

## 2020-12-26 LAB — EXTRA LAV TOP TUBE

## 2020-12-26 MED ORDER — VITAMIN D (ERGOCALCIFEROL) 1.25 MG (50000 UNIT) PO CAPS: 50000.0000 [IU] | ORAL_CAPSULE | ORAL | 0 refills | Status: DC

## 2020-12-26 NOTE — Progress Notes (Signed)
I called.  We need to supplement her vitamin D for at least 4 weeks before surgery.  50,000 units a week for the next 6 weeks.  Jackelyn Poling can you schedule her not sooner than 4 weeks from now.  Thanks

## 2020-12-27 ENCOUNTER — Encounter: Payer: Self-pay | Admitting: Orthopedic Surgery

## 2020-12-27 DIAGNOSIS — S42202D Unspecified fracture of upper end of left humerus, subsequent encounter for fracture with routine healing: Secondary | ICD-10-CM | POA: Diagnosis not present

## 2020-12-27 DIAGNOSIS — I10 Essential (primary) hypertension: Secondary | ICD-10-CM | POA: Diagnosis not present

## 2020-12-27 DIAGNOSIS — G43909 Migraine, unspecified, not intractable, without status migrainosus: Secondary | ICD-10-CM | POA: Diagnosis not present

## 2020-12-27 DIAGNOSIS — F5101 Primary insomnia: Secondary | ICD-10-CM | POA: Diagnosis not present

## 2020-12-27 DIAGNOSIS — M4800 Spinal stenosis, site unspecified: Secondary | ICD-10-CM | POA: Diagnosis not present

## 2020-12-27 DIAGNOSIS — S72002D Fracture of unspecified part of neck of left femur, subsequent encounter for closed fracture with routine healing: Secondary | ICD-10-CM | POA: Diagnosis not present

## 2020-12-27 DIAGNOSIS — C50211 Malignant neoplasm of upper-inner quadrant of right female breast: Secondary | ICD-10-CM | POA: Diagnosis not present

## 2020-12-27 DIAGNOSIS — D539 Nutritional anemia, unspecified: Secondary | ICD-10-CM | POA: Diagnosis not present

## 2020-12-27 DIAGNOSIS — F419 Anxiety disorder, unspecified: Secondary | ICD-10-CM | POA: Diagnosis not present

## 2020-12-27 NOTE — Progress Notes (Signed)
Office Visit Note   Patient: [name]           Date of Birth: 03-Nov-1951           MRN: [id number] Visit Date: 12/24/2020 Requested by: Maurice Small, MD [place],  [place] [address] PCP: Maurice Small, MD  Subjective: Chief Complaint  Patient presents with  . Other     Scan review    HPI: [name] is a 70 year old patient who presents for follow-up left shoulder.  Since have seen her she has had a CT scan which is reviewed with the patient.  She does not have much glenoid deformity but she does have nonunion of proximal humerus fracture with scalloping of the humeral head.  There has been some bone loss and shortening present.  Tuberosities appear thinned but intact.  Patient reports continued debilitating daily pain.  She does have history of breast cancer and has undergone a lot of treatment this past year.  She has significant functional limitations and pain with that left shoulder.              ROS: All systems reviewed are negative as they relate to the chief complaint within the history of present illness.  Patient denies  fevers or chills.   Assessment & Plan: Visit Diagnoses:  1. Closed 3-part fracture of proximal humerus, left, initial encounter     Plan: Impression is left shoulder proximal humerus fracture nonunion with scalloping of the humeral head and pain with capsular contracture around the shoulder joint.  Deltoid does fire.  Plan is reverse shoulder replacement.  Risk benefits are discussed with the patient including but not limited to infection nerve vessel damage dislocation incomplete pain relief as well as incomplete restoration of function.  At the time of this dictation we did draw albumin level which was above four and also vitamin D level which was twenty.  We will supplement her vitamin D in order to enhance the chances for tuberosity healing.  Plan for surgery after about 4 weeks.  Discussed this on the phone with [facility].  All  questions answered.  Follow-Up Instructions: No follow-ups on file.   Orders:  Orders Placed This Encounter  Procedures  . Albumin  . Vitamin D (25 hydroxy)  . EXTRA LAV TOP TUBE   No orders of the defined types were placed in this encounter.     Procedures: No procedures performed   Clinical Data: No additional findings.  Objective: Vital Signs: LMP  (LMP Unknown)   Physical Exam:   Constitutional: Patient appears well-developed HEENT:  Head: Normocephalic Eyes:EOM are normal Neck: Normal range of motion Cardiovascular: Normal rate Pulmonary/chest: Effort normal Neurologic: Patient is alert Skin: Skin is warm Psychiatric: Patient has normal mood and affect    Ortho Exam: Ortho exam demonstrates functional deltoid on the left-hand side with forward flexion and abduction both well below 90 degrees.  External rotation of 50 degrees of abduction is about 45 degrees which is painful.  Motor sensory function to the hands intact.  No lymphadenopathy is present in that left shoulder girdle region.  Neck range of motion is full.  Specialty Comments:  No specialty comments available.  Imaging: No results found.   PMFS History: Patient Active Problem List   Diagnosis Date Noted  . Port-A-Cath in place 07/22/2020  . Left leg cellulitis 04/17/2020  . Cellulitis of left leg 04/17/2020  . Hypokalemia 04/17/2020  . Macrocytic anemia 04/17/2020  . Anxiety  04/17/2020  . Depression 04/17/2020  . Chronic diarrhea 04/17/2020  . Chemotherapy induced diarrhea 04/17/2020  . Malignant neoplasm of upper-inner quadrant of right breast in female, estrogen receptor positive ([place]) 12/26/2019  . Encephalopathy 02/02/2018  . Hypothyroidism 02/02/2018  . AKI (acute kidney injury) ([place]) 02/02/2018  . Chronic back pain 02/02/2018  . Alcohol abuse 02/02/2018  . Weight gain 02/02/2018  . Benign essential HTN 02/02/2018   Past Medical History:  Diagnosis Date  . Anxiety   . Cancer  (HCC)    breast  . Chronic pain   . Concussion 3/08   ICU x 3 days  . Depression   . Hypertension   . Hypothyroidism   . Spinal stenosis   . Thyroid disease ?1994    Family History  Problem Relation Age of Onset  . Thyroid disease Mother   . Dementia Mother   . Diabetes Father   . Stroke Father   . Hypertension Sister   . Diabetes Sister     Past Surgical History:  Procedure Laterality Date  . BREAST LUMPECTOMY WITH RADIOACTIVE SEED AND SENTINEL LYMPH NODE BIOPSY Right 01/22/2020   Procedure: RIGHT BREAST LUMPECTOMY WITH RADIOACTIVE SEED X2 AND RIGHT SENTINEL LYMPH NODE MAPPING;  Surgeon: Erroll Luna, MD;  Location: [facility];  Service: General;  Laterality: Right;  . BREAST SURGERY Right 4/99   breast biopsy, benign  . PORTACATH PLACEMENT Right 01/22/2020   Procedure: INSERTION PORT-A-CATH WITH ULTRASOUND;  Surgeon: Erroll Luna, MD;  Location: [facility];  Service: General;  Laterality: Right;  . PORTACATH PLACEMENT Right 02/28/2020   Procedure: PORT A CATH REVISION;  Surgeon: Erroll Luna, MD;  Location: [facility];  Service: General;  Laterality: Right;  . SPINAL FUSION  03/04/11   with ORIF   Social History   Occupational History  . Not on file  Tobacco Use  . Smoking status: Former Smoker    Types: Cigarettes  . Smokeless tobacco: Never Used  Vaping Use  . Vaping Use: Never used  Substance and Sexual Activity  . Alcohol use: Yes    Alcohol/week: 5.0 standard drinks    Types: 5 Standard drinks or equivalent per week    Comment: wine  . Drug use: No  . Sexual activity: Never    Partners: Male    Birth control/protection: Post-menopausal

## 2020-12-31 DIAGNOSIS — F5101 Primary insomnia: Secondary | ICD-10-CM | POA: Diagnosis not present

## 2020-12-31 DIAGNOSIS — I10 Essential (primary) hypertension: Secondary | ICD-10-CM | POA: Diagnosis not present

## 2020-12-31 DIAGNOSIS — C50211 Malignant neoplasm of upper-inner quadrant of right female breast: Secondary | ICD-10-CM | POA: Diagnosis not present

## 2020-12-31 DIAGNOSIS — S72002D Fracture of unspecified part of neck of left femur, subsequent encounter for closed fracture with routine healing: Secondary | ICD-10-CM | POA: Diagnosis not present

## 2020-12-31 DIAGNOSIS — F419 Anxiety disorder, unspecified: Secondary | ICD-10-CM | POA: Diagnosis not present

## 2020-12-31 DIAGNOSIS — D539 Nutritional anemia, unspecified: Secondary | ICD-10-CM | POA: Diagnosis not present

## 2020-12-31 DIAGNOSIS — S42202D Unspecified fracture of upper end of left humerus, subsequent encounter for fracture with routine healing: Secondary | ICD-10-CM | POA: Diagnosis not present

## 2020-12-31 DIAGNOSIS — G43909 Migraine, unspecified, not intractable, without status migrainosus: Secondary | ICD-10-CM | POA: Diagnosis not present

## 2020-12-31 DIAGNOSIS — M4800 Spinal stenosis, site unspecified: Secondary | ICD-10-CM | POA: Diagnosis not present

## 2021-01-05 NOTE — Progress Notes (Signed)
Patient Care Team: Maurice Small, MD as PCP - General (Family Medicine) Rockwell Germany, RN as Oncology Nurse Navigator Tressie Ellis, Paulette Blanch, RN as Oncology Nurse Navigator Erroll Luna, MD as Consulting Physician (General Surgery) Nicholas Lose, MD as Consulting Physician (Hematology and Oncology) Gery Pray, MD as Consulting Physician (Radiation Oncology)  DIAGNOSIS:    ICD-10-CM   1. Malignant neoplasm of upper-inner quadrant of right breast in female, estrogen receptor positive ([place])  C50.211    Z17.0     SUMMARY OF ONCOLOGIC HISTORY: Oncology History  Malignant neoplasm of upper-inner quadrant of right breast in female, estrogen receptor positive ([place])  12/26/2019 Initial Diagnosis   Patient palpated a right breast lump x1wk. Mammogram and US showed two adjacent masses at the 2 o'clock position measuring 1.4cm and 0.6cm, calcifications in the outer right breast at the 9 o'clock position, no right axillary adenopathy. Biopsy showed IDC at the 2 o'clock position, grade 3, HER-2 + (3+), ER+ 90%, PR+ 30%, Ki67 30%, and DCIS in the upper outer right breast, high grade, ER+ 95%, PR 90%.   01/22/2020 Surgery   Right breast lumpectomy x2 ([facility]): Medial position: IDC, grade 3, 2.3cm, with high grade DCIS, clear margins Lateral position: high grade DCIS, clear margins, 4 right axillary lymph nodes negative    01/22/2020 Cancer Staging   Staging form: Breast, AJCC 8th Edition - Pathologic stage from 01/22/2020: Stage IA (pT2, pN0, cM0, G3, ER+, PR+, HER2+) - Signed by Gardenia Phlegm, NP on 02/06/2020   02/19/2020 -  Chemotherapy    Patient is on Treatment Plan: BREAST WEEKLY PACLITAXEL / TRASTUZUMAB / MAINTENANCE TRASTUZUMAB EVERY 21 DAYS      05/29/2020 - 06/25/2020 Radiation Therapy   Adjuvant radiation     CHIEF COMPLIANT: Herceptin maintenance  INTERVAL HISTORY: GAZELLA [name] is a 70 y.o. with above-mentioned history of right breast cancerwhounderwent a right breast  lumpectomy x2,adjuvant chemotherapy,radiation,and is currently onHerceptinmaintenance.Echo on 10/16/20 showed an ejection fraction of 60-65%. She presents to the clinic todayfortreatment.  ALLERGIES:  is allergic to [employer] tartrate].  MEDICATIONS:  Current Outpatient Medications  Medication Sig Dispense Refill   amitriptyline (ELAVIL) 25 MG tablet Take 75 mg by mouth at bedtime.      amLODipine (NORVASC) 5 MG tablet Take 7.5 mg by mouth at bedtime.      diazepam (VALIUM) 10 MG tablet TAKE 1 TABLET BY MOUTH AT BEDTIME IF NEEDED FOR SLEEP. 30 tablet 0   furosemide (LASIX) 20 MG tablet Take 1 tablet (20 mg total) by mouth daily as needed for fluid or edema. 30 tablet 3   IBU 800 MG tablet TAKE 1 TABLET EVERY 8 HOURS AS NEEDED. 60 tablet 0   levothyroxine (SYNTHROID, LEVOTHROID) 137 MCG tablet Take 137 mcg by mouth at bedtime.      lidocaine-prilocaine (EMLA) cream Apply to affected area once (Patient taking differently: Apply 1 application topically daily as needed (prior to port being accessed.). Apply to affected area once) 30 g 3   oxyCODONE-acetaminophen (PERCOCET/ROXICET) 5-325 MG tablet Take 1-2 tablets by mouth every 6 (six) hours as needed for severe pain. (Patient not taking: No sig reported) 30 tablet 0   potassium chloride SA (KLOR-CON) 20 MEQ tablet Take 1 tablet (20 mEq total) by mouth 2 (two) times daily. Take 1 tablet 20 mEq daily (Patient not taking: No sig reported) 30 tablet 1   saccharomyces boulardii (FLORASTOR) 250 MG capsule Take 1 capsule (250 mg total) by mouth 2 (two) times daily. (  Patient not taking: No sig reported) 60 capsule 6   venlafaxine XR (EFFEXOR-XR) 150 MG 24 hr capsule Take 1 capsule by mouth daily.     Vitamin D, Ergocalciferol, (DRISDOL) 1.25 MG (50000 UNIT) CAPS capsule Take 1 capsule (50,000 Units total) by mouth every 7 (seven) days. 6 capsule 0   No current facility-administered medications for this visit.    PHYSICAL  EXAMINATION: ECOG PERFORMANCE STATUS: 1 - Symptomatic but completely ambulatory  There were no vitals filed for this visit. There were no vitals filed for this visit.  LABORATORY DATA:  I have reviewed the data as listed CMP Latest Ref Rng & Units 12/12/2020 11/25/2020 10/14/2020  Glucose 70 - 99 mg/dL 103(H) 86 98  BUN 8 - 23 mg/dL _0 Creatinine 0.44 - 1.00 mg/dL 0.64 0.70 0.69  Sodium 135 - 145 mmol/L 144 140 136  Potassium 3.5 - 5.1 mmol/L 3.3(L) 3.9 3.8  Chloride 98 - 111 mmol/L 107 104 103  CO2 22 - 32 mmol/L _1 Calcium 8.9 - 10.3 mg/dL 8.7(L) 8.6(L) 9.1  Total Protein 6.5 - 8.1 g/dL 6.7 6.4(L) 6.2(L)  Total Bilirubin 0.3 - 1.2 mg/dL 0.3 0.3 0.4  Alkaline Phos 38 - 126 U/L 80 103 108  AST 15 - 41 U/L 23 16 10(L)  ALT 0 - 44 U/L _2 Lab Results  Component Value Date   WBC 8.6 01/06/2021   HGB 12.6 01/06/2021   HCT 37.6 01/06/2021   MCV 98.7 01/06/2021   PLT 223 01/06/2021   NEUTROABS 6.6 01/06/2021    ASSESSMENT & PLAN:  Malignant neoplasm of upper-inner quadrant of right breast in female, estrogen receptor positive ([place]) 01/22/2020: Right medial lumpectomy: Grade 3 IDC 2.3 cm with high-grade DCIS with necrosis, margins negative, negative for lymphovascular or perineural invasion, Right lateral lumpectomy: Isolated foci of DCIS high-grade, resection margins negative 4 lymph nodes negative, ER 90%, PR 30%, HER-2 3+ positive, Ki-67 30%  Treatment plan: 1.Adjuvant chemotherapy with Taxol Herceptin weekly x9(stopped early because of lower extremity edema)followed by Herceptin maintenance 2.Adjuvant radiation therapystarted 6/10/2021completed 06/25/2020 3.Followed by adjuvant antiestrogen therapy completed 06/25/2020 ----------------------------------------------------------------------------------------------------------------------------------------------------------- Current treatment:Herceptin maintenance Echocardiogram 02/04/2020: EF 60 to  65%  Herceptin toxicities: 1.Lower extremity edema leftleg.Required hospitalization for cellulitis treated with IV antibiotics. Patient has Lasix but she is been holding it because of recent fracture 2. Diarrhea: Due to Herceptin.  I sent a prescription for Florastor probiotics.  Major depression: Currently on Prozac. Pain issues:Currently onPercocets 2.5 mg.  08/21/2020: Mildly comminuted and displaced fracture of proximal left humerus: After a fall 10/02/2020: Left hip femoral fracture  Leg pain:Currently on Percocets.  Palliative care following Hypokalemia:On oral potassium daily. Insomnia: I renewed her Valium prescription today.  Adrenal mass and liver abnormalities: We will obtain another CT chest abdomen pelvis in April 2022.  Return to clinicevery 3 weeks for Herceptin maintenance. She completes her treatment February 8th 2022. Will request removal of the port along with her shoulder surgery with Dr. Marlou Sa.  Return to clinic in April after CT scans are done.    No orders of the defined types were placed in this encounter.  The patient has a good understanding of the overall plan. she agrees with it. she will call with any problems that may develop before the next visit here.  Total time spent: 30 mins including face to face time and time spent for planning, charting and coordination of care  Nicholas Lose, MD 01/06/2021  I, Molly Dorshimer, am acting as scribe for Dr. Niccolo Burggraf. ° °I have reviewed the above documentation for accuracy and completeness, and I agree with the above. ° ° ° ° ° ° °

## 2021-01-06 ENCOUNTER — Inpatient Hospital Stay: Payer: Medicare PPO | Attending: Hematology and Oncology

## 2021-01-06 ENCOUNTER — Other Ambulatory Visit: Payer: Self-pay

## 2021-01-06 ENCOUNTER — Inpatient Hospital Stay: Payer: Medicare PPO

## 2021-01-06 ENCOUNTER — Encounter: Payer: Self-pay | Admitting: *Deleted

## 2021-01-06 ENCOUNTER — Inpatient Hospital Stay (HOSPITAL_BASED_OUTPATIENT_CLINIC_OR_DEPARTMENT_OTHER): Payer: Medicare PPO | Admitting: Hematology and Oncology

## 2021-01-06 DIAGNOSIS — Z5112 Encounter for antineoplastic immunotherapy: Secondary | ICD-10-CM | POA: Insufficient documentation

## 2021-01-06 DIAGNOSIS — C50211 Malignant neoplasm of upper-inner quadrant of right female breast: Secondary | ICD-10-CM | POA: Insufficient documentation

## 2021-01-06 DIAGNOSIS — Z95828 Presence of other vascular implants and grafts: Secondary | ICD-10-CM

## 2021-01-06 DIAGNOSIS — Z170 Estrogen receptor positive status [ER+]: Secondary | ICD-10-CM | POA: Insufficient documentation

## 2021-01-06 LAB — CMP (CANCER CENTER ONLY)
ALT: 21 U/L (ref 0–44)
AST: 15 U/L (ref 15–41)
Albumin: 3.6 g/dL (ref 3.5–5.0)
Alkaline Phosphatase: 92 U/L (ref 38–126)
Anion gap: 9 (ref 5–15)
BUN: 18 mg/dL (ref 8–23)
CO2: 27 mmol/L (ref 22–32)
Calcium: 8.8 mg/dL — ABNORMAL LOW (ref 8.9–10.3)
Chloride: 103 mmol/L (ref 98–111)
Creatinine: 0.73 mg/dL (ref 0.44–1.00)
GFR, Estimated: >60 mL/min (ref >60)
Glucose, Bld: 101 mg/dL — ABNORMAL HIGH (ref 70–99)
Potassium: 3.9 mmol/L (ref 3.5–5.1)
Sodium: 139 mmol/L (ref 135–145)
Total Bilirubin: 0.6 mg/dL (ref 0.3–1.2)
Total Protein: 7 g/dL (ref 6.5–8.1)

## 2021-01-06 LAB — CBC WITH DIFFERENTIAL (CANCER CENTER ONLY)
Abs Immature Granulocytes: 0.02 10*3/uL (ref 0.00–0.07)
Basophils Absolute: 0 10*3/uL (ref 0.0–0.1)
Basophils Relative: 0 %
Eosinophils Absolute: 0.1 10*3/uL (ref 0.0–0.5)
Eosinophils Relative: 1 %
HCT: 37.6 % (ref 36.0–46.0)
Hemoglobin: 12.6 g/dL (ref 12.0–15.0)
Immature Granulocytes: 0 %
Lymphocytes Relative: 14 %
Lymphs Abs: 1.2 10*3/uL (ref 0.7–4.0)
MCH: 33.1 pg (ref 26.0–34.0)
MCHC: 33.5 g/dL (ref 30.0–36.0)
MCV: 98.7 fL (ref 80.0–100.0)
Monocytes Absolute: 0.7 10*3/uL (ref 0.1–1.0)
Monocytes Relative: 8 %
Neutro Abs: 6.6 10*3/uL (ref 1.7–7.7)
Neutrophils Relative %: 77 %
Platelet Count: 223 10*3/uL (ref 150–400)
RBC: 3.81 MIL/uL — ABNORMAL LOW (ref 3.87–5.11)
RDW: 14.6 % (ref 11.5–15.5)
WBC Count: 8.6 10*3/uL (ref 4.0–10.5)
nRBC: 0 % (ref 0.0–0.2)

## 2021-01-06 MED ORDER — HEPARIN SOD (PORK) LOCK FLUSH 100 UNIT/ML IV SOLN
500.0000 [IU] | Freq: Once | INTRAVENOUS | Status: AC | PRN
  Administered 2021-01-06: 500 [IU]
  Filled 2021-01-06: qty 5

## 2021-01-06 MED ORDER — SODIUM CHLORIDE 0.9% FLUSH
10.0000 mL | Freq: Once | INTRAVENOUS | Status: AC
  Administered 2021-01-06: 10 mL
  Filled 2021-01-06: qty 10

## 2021-01-06 MED ORDER — ACETAMINOPHEN 325 MG PO TABS
ORAL_TABLET | ORAL | Status: AC
  Filled 2021-01-06: qty 2

## 2021-01-06 MED ORDER — DIAZEPAM 10 MG PO TABS: ORAL_TABLET | ORAL | 3 refills | Status: DC

## 2021-01-06 MED ORDER — SODIUM CHLORIDE 0.9 % IV SOLN
Freq: Once | INTRAVENOUS | Status: AC
  Filled 2021-01-06: qty 250

## 2021-01-06 MED ORDER — TRASTUZUMAB-ANNS CHEMO 150 MG IV SOLR
6.0000 mg/kg | Freq: Once | INTRAVENOUS | Status: AC
  Administered 2021-01-06: 546 mg via INTRAVENOUS
  Filled 2021-01-06: qty 26

## 2021-01-06 MED ORDER — ACETAMINOPHEN 325 MG PO TABS
650.0000 mg | ORAL_TABLET | Freq: Once | ORAL | Status: AC
  Administered 2021-01-06: 650 mg via ORAL

## 2021-01-06 MED ORDER — DIPHENHYDRAMINE HCL 25 MG PO CAPS
ORAL_CAPSULE | ORAL | Status: AC
  Filled 2021-01-06: qty 2

## 2021-01-06 MED ORDER — SODIUM CHLORIDE 0.9% FLUSH
10.0000 mL | INTRAVENOUS | Status: DC | PRN
  Administered 2021-01-06: 10 mL
  Filled 2021-01-06: qty 10

## 2021-01-06 MED ORDER — DIPHENHYDRAMINE HCL 25 MG PO CAPS
50.0000 mg | ORAL_CAPSULE | Freq: Once | ORAL | Status: AC
  Administered 2021-01-06: 50 mg via ORAL

## 2021-01-06 NOTE — Patient Instructions (Signed)
[facility] Discharge Instructions for Patients Receiving Chemotherapy  Today you received the following chemotherapy agents: Trastuzumab (Herceptin).  To help prevent nausea and vomiting after your treatment, we encourage you to take your nausea medication as prescribed. If you develop nausea and vomiting that is not controlled by your nausea medication, call the clinic.   BELOW ARE SYMPTOMS THAT SHOULD BE REPORTED IMMEDIATELY:  *FEVER GREATER THAN 100.5 F  *CHILLS WITH OR WITHOUT FEVER  NAUSEA AND VOMITING THAT IS NOT CONTROLLED WITH YOUR NAUSEA MEDICATION  *UNUSUAL SHORTNESS OF BREATH  *UNUSUAL BRUISING OR BLEEDING  TENDERNESS IN MOUTH AND THROAT WITH OR WITHOUT PRESENCE OF ULCERS  *URINARY PROBLEMS  *BOWEL PROBLEMS  UNUSUAL RASH Items with * indicate a potential emergency and should be followed up as soon as possible.  Feel free to call the clinic should you have any questions or concerns. The clinic phone number is [phone].  Please show the CHEMO ALERT CARD at check-in to the Emergency Department and triage nurse.   

## 2021-01-06 NOTE — Assessment & Plan Note (Signed)
01/22/2020: Right medial lumpectomy: Grade 3 IDC 2.3 cm with high-grade DCIS with necrosis, margins negative, negative for lymphovascular or perineural invasion, Right lateral lumpectomy: Isolated foci of DCIS high-grade, resection margins negative 4 lymph nodes negative, ER 90%, PR 30%, HER-2 3+ positive, Ki-67 30%  Treatment plan: 1.Adjuvant chemotherapy with Taxol Herceptin weekly x9(stopped early because of lower extremity edema)followed by Herceptin maintenance 2.Adjuvant radiation therapystarted 6/10/2021completed 06/25/2020 3.Followed by adjuvant antiestrogen therapy completed 06/25/2020 ----------------------------------------------------------------------------------------------------------------------------------------------------------- Current treatment:Herceptin maintenance Echocardiogram 02/04/2020: EF 60 to 65%  Herceptin toxicities: 1.Lower extremity edema leftleg.Required hospitalization for cellulitis treated with IV antibiotics. Patient has Lasix but she is been holding it because of recent fracture 2. Diarrhea: Due to Herceptin.  I sent a prescription for Florastor probiotics.  Major depression: Currently on Prozac. Pain issues:Currently onPercocets 2.5 mg.  08/21/2020: Mildly comminuted and displaced fracture of proximal left humerus: After a fall 10/02/2020: Left hip femoral fracture  Leg pain:Currently on Percocets.  I will consult palliative care to assist with pain management.  We will give her 2 mg of morphine sulfate IV in the infusion. Hypokalemia:On oral potassium daily.  Return to clinicevery 3 weeks for Herceptin maintenance. She completes her treatment February 8th 2022. Will request removal of the port  Return to clinic in 6 months for follow-up and after that we can see her once a year.

## 2021-01-07 ENCOUNTER — Other Ambulatory Visit: Payer: Self-pay | Admitting: Nurse Practitioner

## 2021-01-07 ENCOUNTER — Telehealth: Payer: Self-pay | Admitting: Hematology and Oncology

## 2021-01-07 DIAGNOSIS — F419 Anxiety disorder, unspecified: Secondary | ICD-10-CM | POA: Diagnosis not present

## 2021-01-07 DIAGNOSIS — Z515 Encounter for palliative care: Secondary | ICD-10-CM

## 2021-01-07 DIAGNOSIS — F5101 Primary insomnia: Secondary | ICD-10-CM | POA: Diagnosis not present

## 2021-01-07 DIAGNOSIS — I10 Essential (primary) hypertension: Secondary | ICD-10-CM | POA: Diagnosis not present

## 2021-01-07 DIAGNOSIS — D539 Nutritional anemia, unspecified: Secondary | ICD-10-CM | POA: Diagnosis not present

## 2021-01-07 DIAGNOSIS — S72002D Fracture of unspecified part of neck of left femur, subsequent encounter for closed fracture with routine healing: Secondary | ICD-10-CM | POA: Diagnosis not present

## 2021-01-07 DIAGNOSIS — S42202D Unspecified fracture of upper end of left humerus, subsequent encounter for fracture with routine healing: Secondary | ICD-10-CM | POA: Diagnosis not present

## 2021-01-07 DIAGNOSIS — C50211 Malignant neoplasm of upper-inner quadrant of right female breast: Secondary | ICD-10-CM | POA: Diagnosis not present

## 2021-01-07 DIAGNOSIS — G43909 Migraine, unspecified, not intractable, without status migrainosus: Secondary | ICD-10-CM | POA: Diagnosis not present

## 2021-01-07 DIAGNOSIS — C801 Malignant (primary) neoplasm, unspecified: Secondary | ICD-10-CM

## 2021-01-07 DIAGNOSIS — M4800 Spinal stenosis, site unspecified: Secondary | ICD-10-CM | POA: Diagnosis not present

## 2021-01-07 NOTE — Telephone Encounter (Signed)
Scheduled per 1/18 los. Pt will receive an updated appt calendar per next visit appt notes  
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
## 2021-01-12 DIAGNOSIS — C50211 Malignant neoplasm of upper-inner quadrant of right female breast: Secondary | ICD-10-CM | POA: Diagnosis not present

## 2021-01-12 DIAGNOSIS — D539 Nutritional anemia, unspecified: Secondary | ICD-10-CM | POA: Diagnosis not present

## 2021-01-12 DIAGNOSIS — F419 Anxiety disorder, unspecified: Secondary | ICD-10-CM | POA: Diagnosis not present

## 2021-01-12 DIAGNOSIS — M4800 Spinal stenosis, site unspecified: Secondary | ICD-10-CM | POA: Diagnosis not present

## 2021-01-12 DIAGNOSIS — I10 Essential (primary) hypertension: Secondary | ICD-10-CM | POA: Diagnosis not present

## 2021-01-12 DIAGNOSIS — S42202D Unspecified fracture of upper end of left humerus, subsequent encounter for fracture with routine healing: Secondary | ICD-10-CM | POA: Diagnosis not present

## 2021-01-12 DIAGNOSIS — S72002D Fracture of unspecified part of neck of left femur, subsequent encounter for closed fracture with routine healing: Secondary | ICD-10-CM | POA: Diagnosis not present

## 2021-01-12 DIAGNOSIS — G43909 Migraine, unspecified, not intractable, without status migrainosus: Secondary | ICD-10-CM | POA: Diagnosis not present

## 2021-01-12 DIAGNOSIS — F5101 Primary insomnia: Secondary | ICD-10-CM | POA: Diagnosis not present

## 2021-01-15 DIAGNOSIS — E559 Vitamin D deficiency, unspecified: Secondary | ICD-10-CM | POA: Insufficient documentation

## 2021-01-15 DIAGNOSIS — F1320 Sedative, hypnotic or anxiolytic dependence, uncomplicated: Secondary | ICD-10-CM | POA: Insufficient documentation

## 2021-01-15 DIAGNOSIS — F5101 Primary insomnia: Secondary | ICD-10-CM | POA: Insufficient documentation

## 2021-01-15 DIAGNOSIS — E538 Deficiency of other specified B group vitamins: Secondary | ICD-10-CM | POA: Insufficient documentation

## 2021-01-15 DIAGNOSIS — F331 Major depressive disorder, recurrent, moderate: Secondary | ICD-10-CM | POA: Insufficient documentation

## 2021-01-22 ENCOUNTER — Other Ambulatory Visit: Payer: Self-pay

## 2021-01-22 DIAGNOSIS — F419 Anxiety disorder, unspecified: Secondary | ICD-10-CM | POA: Diagnosis not present

## 2021-01-22 DIAGNOSIS — C50211 Malignant neoplasm of upper-inner quadrant of right female breast: Secondary | ICD-10-CM | POA: Diagnosis not present

## 2021-01-22 DIAGNOSIS — F5101 Primary insomnia: Secondary | ICD-10-CM | POA: Diagnosis not present

## 2021-01-22 DIAGNOSIS — D539 Nutritional anemia, unspecified: Secondary | ICD-10-CM | POA: Diagnosis not present

## 2021-01-22 DIAGNOSIS — M4800 Spinal stenosis, site unspecified: Secondary | ICD-10-CM | POA: Diagnosis not present

## 2021-01-22 DIAGNOSIS — I10 Essential (primary) hypertension: Secondary | ICD-10-CM | POA: Diagnosis not present

## 2021-01-22 DIAGNOSIS — S42202D Unspecified fracture of upper end of left humerus, subsequent encounter for fracture with routine healing: Secondary | ICD-10-CM | POA: Diagnosis not present

## 2021-01-22 DIAGNOSIS — S72002D Fracture of unspecified part of neck of left femur, subsequent encounter for closed fracture with routine healing: Secondary | ICD-10-CM | POA: Diagnosis not present

## 2021-01-22 DIAGNOSIS — G43909 Migraine, unspecified, not intractable, without status migrainosus: Secondary | ICD-10-CM | POA: Diagnosis not present

## 2021-01-26 DIAGNOSIS — G43909 Migraine, unspecified, not intractable, without status migrainosus: Secondary | ICD-10-CM | POA: Diagnosis not present

## 2021-01-26 DIAGNOSIS — C50211 Malignant neoplasm of upper-inner quadrant of right female breast: Secondary | ICD-10-CM | POA: Diagnosis not present

## 2021-01-26 DIAGNOSIS — M4800 Spinal stenosis, site unspecified: Secondary | ICD-10-CM | POA: Diagnosis not present

## 2021-01-26 DIAGNOSIS — F419 Anxiety disorder, unspecified: Secondary | ICD-10-CM | POA: Diagnosis not present

## 2021-01-26 DIAGNOSIS — I10 Essential (primary) hypertension: Secondary | ICD-10-CM | POA: Diagnosis not present

## 2021-01-26 DIAGNOSIS — S42202D Unspecified fracture of upper end of left humerus, subsequent encounter for fracture with routine healing: Secondary | ICD-10-CM | POA: Diagnosis not present

## 2021-01-26 DIAGNOSIS — F5101 Primary insomnia: Secondary | ICD-10-CM | POA: Diagnosis not present

## 2021-01-26 DIAGNOSIS — S72002D Fracture of unspecified part of neck of left femur, subsequent encounter for closed fracture with routine healing: Secondary | ICD-10-CM | POA: Diagnosis not present

## 2021-01-26 DIAGNOSIS — D539 Nutritional anemia, unspecified: Secondary | ICD-10-CM | POA: Diagnosis not present

## 2021-01-26 NOTE — Progress Notes (Addendum)
Surgical Instructions    Your procedure is scheduled on February 14  Report to [facility] Main Entrance "A" at [place].M., then check in with the Admitting office.  Call this number if you have problems the morning of surgery:  [phone]   If you have any questions prior to your surgery date call [phone]: Open Monday-Friday 8am-4pm    Remember:  Do not eat after midnight the night before your surgery  You may drink clear liquids until 0545 am the morning of your surgery.   Clear liquids allowed are: Water, Non-Citrus Juices (without pulp), Carbonated Beverages, Clear Tea, Black Coffee Only, and Gatorade    Take these medicines the morning of surgery with A SIP OF WATER  oxyCODONE-acetaminophen (PERCOCET/ROXICET)  If needed for pain   As of today, STOP taking any Aspirin (unless otherwise instructed by your surgeon) Aleve, Naproxen, Ibuprofen, Motrin, Advil, Goody's, BC's, all herbal medications, fish oil, and all vitamins.                     Do not wear jewelry, make up, or nail polish            Do not wear lotions, powders, perfumes, or deodorant.            Do not shave 48 hours prior to surgery.              Do not bring valuables to the hospital.            [facility] is not responsible for any belongings or valuables.  Do NOT Smoke (Tobacco/Vaping) or drink Alcohol 24 hours prior to your procedure If you use a CPAP at night, you may bring all equipment for your overnight stay.   Contacts, glasses, dentures or bridgework may not be worn into surgery, please bring cases for these belongings   For patients admitted to the hospital, discharge time will be determined by your treatment team.   Patients discharged the day of surgery will not be allowed to drive home, and someone needs to stay with them for 24 hours.    Special instructions:   [place]- Preparing For Surgery  Before surgery, you can play an important role. Because skin is not sterile, your skin  needs to be as free of germs as possible. You can reduce the number of germs on your skin by washing with CHG (chlorahexidine gluconate) Soap before surgery.  CHG is an antiseptic cleaner which kills germs and bonds with the skin to continue killing germs even after washing.    Oral Hygiene is also important to reduce your risk of infection.  Remember - BRUSH YOUR TEETH THE MORNING OF SURGERY WITH YOUR REGULAR TOOTHPASTE  Please do not use if you have an allergy to CHG or antibacterial soaps. If your skin becomes reddened/irritated stop using the CHG.  Do not shave (including legs and underarms) for at least 48 hours prior to first CHG shower. It is OK to shave your face.  Please follow these instructions carefully.   1. If you chose to wash your hair, wash your hair first as usual with your normal shampoo.  2. After you shampoo, rinse your hair and body thoroughly to remove the shampoo.  3. Wash Face and genitals (private parts) with your normal soap.   4. THEN Shower the NIGHT BEFORE SURGERY and the MORNING OF SURGERY with CHG Soap.   5. Use CHG as you would any other liquid soap. You  can apply CHG directly to the skin and wash gently with a scrungie or a clean washcloth.   6. Apply the CHG Soap to your body ONLY FROM THE NECK DOWN.  Do not use on open wounds or open sores. Avoid contact with your eyes, ears, mouth and genitals (private parts). Wash Face and genitals (private parts)  with your normal soap.   7. Wash thoroughly, paying special attention to the area where your surgery will be performed.  8. Thoroughly rinse your body with warm water from the neck down.  9. DO NOT shower/wash with your normal soap after using and rinsing off the CHG Soap.  10. Pat yourself dry with a CLEAN TOWEL.  11. Wear CLEAN PAJAMAS to bed the night before surgery  12. Place CLEAN SHEETS on your bed the night before your surgery  13. DO NOT SLEEP WITH PETS.  [place]- Preparing for Total  Shoulder Arthroplasty  Before surgery, you can play an important role. Because skin is not sterile, your skin needs to be as free of germs as possible. You can reduce the number of germs on your skin by using the following products.   Benzoyl Peroxide Gel  o Reduces the number of germs present on the skin  o Applied twice a day to shoulder area starting two days before surgery   Chlorhexidine Gluconate (CHG) Soap (instructions listed above on how to wash with CHG Soap)  o An antiseptic cleaner that kills germs and bonds with the skin to continue killing germs even after washing  o Used for showering the night before surgery and morning of surgery   ==================================================================  Please follow these instructions carefully:  BENZOYL PEROXIDE 5% GEL  Please do not use if you have an allergy to benzoyl peroxide. If your skin becomes reddened/irritated stop using the benzoyl peroxide.  Starting two days before surgery, apply as follows:  1. Apply benzoyl peroxide in the morning and at night. Apply after taking a shower. If you are not taking a shower clean entire shoulder front, back, and side along with the armpit with a clean wet washcloth.  2. Place a quarter-sized dollop on your SHOULDER and rub in thoroughly, making sure to cover the front, back, and side of your shoulder, along with the armpit.   2 Days prior to Surgery First Dose on ____2/12_________ Morning Second Dose on ___2/12___________ Night  Day Before Surgery First Dose on _____2/13_________ Morning Night before surgery wash (entire body except face and private areas) with CHG Soap THEN Second Dose on _____2/13_______ Night   Morning of Surgery  wash BODY AGAIN with CHG Soap   4. Do NOT apply benzoyl peroxide gel on the day of surgery   Day of Surgery: Wear Clean/Comfortable clothing the morning of surgery Do not apply any deodorants/lotions.   Remember to brush your  teeth WITH YOUR REGULAR TOOTHPASTE.   Please read over the following fact sheets that you were given.

## 2021-01-27 ENCOUNTER — Encounter (HOSPITAL_COMMUNITY)
Admission: RE | Admit: 2021-01-27 | Discharge: 2021-01-27 | Disposition: A | Discharge status: Home / Self Care | Payer: Medicare PPO | Source: Ambulatory Visit | Attending: Orthopedic Surgery | Admitting: Orthopedic Surgery

## 2021-01-27 ENCOUNTER — Ambulatory Visit: Payer: Medicare PPO

## 2021-01-27 ENCOUNTER — Encounter: Payer: Self-pay | Admitting: *Deleted

## 2021-01-27 ENCOUNTER — Other Ambulatory Visit: Payer: Self-pay

## 2021-01-27 ENCOUNTER — Inpatient Hospital Stay: Payer: Medicare PPO | Attending: Hematology and Oncology

## 2021-01-27 ENCOUNTER — Encounter (HOSPITAL_COMMUNITY): Payer: Self-pay

## 2021-01-27 VITALS — BP 109/85 | HR 84 | Temp 98.6°F | Resp 18

## 2021-01-27 DIAGNOSIS — Z170 Estrogen receptor positive status [ER+]: Secondary | ICD-10-CM | POA: Insufficient documentation

## 2021-01-27 DIAGNOSIS — Z01818 Encounter for other preprocedural examination: Secondary | ICD-10-CM | POA: Diagnosis not present

## 2021-01-27 DIAGNOSIS — I4891 Unspecified atrial fibrillation: Secondary | ICD-10-CM | POA: Diagnosis not present

## 2021-01-27 DIAGNOSIS — Z5112 Encounter for antineoplastic immunotherapy: Secondary | ICD-10-CM | POA: Diagnosis not present

## 2021-01-27 DIAGNOSIS — C50211 Malignant neoplasm of upper-inner quadrant of right female breast: Secondary | ICD-10-CM | POA: Diagnosis not present

## 2021-01-27 DIAGNOSIS — I10 Essential (primary) hypertension: Secondary | ICD-10-CM | POA: Insufficient documentation

## 2021-01-27 LAB — BASIC METABOLIC PANEL
Anion gap: 12 (ref 5–15)
BUN: 18 mg/dL (ref 8–23)
CO2: 26 mmol/L (ref 22–32)
Calcium: 8.8 mg/dL — ABNORMAL LOW (ref 8.9–10.3)
Chloride: 100 mmol/L (ref 98–111)
Creatinine, Ser: 0.81 mg/dL (ref 0.44–1.00)
GFR, Estimated: >60 mL/min (ref >60)
Glucose, Bld: 100 mg/dL — ABNORMAL HIGH (ref 70–99)
Potassium: 3.9 mmol/L (ref 3.5–5.1)
Sodium: 138 mmol/L (ref 135–145)

## 2021-01-27 LAB — CBC
HCT: 39.8 % (ref 36.0–46.0)
Hemoglobin: 13.5 g/dL (ref 12.0–15.0)
MCH: 33.7 pg (ref 26.0–34.0)
MCHC: 33.9 g/dL (ref 30.0–36.0)
MCV: 99.3 fL (ref 80.0–100.0)
Platelets: 261 10*3/uL (ref 150–400)
RBC: 4.01 MIL/uL (ref 3.87–5.11)
RDW: 13.3 % (ref 11.5–15.5)
WBC: 8.5 10*3/uL (ref 4.0–10.5)
nRBC: 0 % (ref 0.0–0.2)

## 2021-01-27 LAB — SURGICAL PCR SCREEN
MRSA, PCR: NEGATIVE
Staphylococcus aureus: NEGATIVE

## 2021-01-27 MED ORDER — SODIUM CHLORIDE 0.9 % IV SOLN
Freq: Once | INTRAVENOUS | Status: AC
  Filled 2021-01-27: qty 250

## 2021-01-27 MED ORDER — HEPARIN SOD (PORK) LOCK FLUSH 100 UNIT/ML IV SOLN
500.0000 [IU] | Freq: Once | INTRAVENOUS | Status: AC | PRN
  Administered 2021-01-27: 500 [IU]
  Filled 2021-01-27: qty 5

## 2021-01-27 MED ORDER — ACETAMINOPHEN 325 MG PO TABS
ORAL_TABLET | ORAL | Status: AC
  Filled 2021-01-27: qty 2

## 2021-01-27 MED ORDER — ACETAMINOPHEN 325 MG PO TABS
650.0000 mg | ORAL_TABLET | Freq: Once | ORAL | Status: AC
  Administered 2021-01-27: 650 mg via ORAL

## 2021-01-27 MED ORDER — TRASTUZUMAB-ANNS CHEMO 150 MG IV SOLR
6.0000 mg/kg | Freq: Once | INTRAVENOUS | Status: AC
  Administered 2021-01-27: 546 mg via INTRAVENOUS
  Filled 2021-01-27: qty 26

## 2021-01-27 MED ORDER — DIPHENHYDRAMINE HCL 25 MG PO CAPS
ORAL_CAPSULE | ORAL | Status: AC
  Filled 2021-01-27: qty 2

## 2021-01-27 MED ORDER — SODIUM CHLORIDE 0.9% FLUSH
10.0000 mL | INTRAVENOUS | Status: DC | PRN
  Administered 2021-01-27: 10 mL
  Filled 2021-01-27: qty 10

## 2021-01-27 MED ORDER — DIPHENHYDRAMINE HCL 25 MG PO CAPS
50.0000 mg | ORAL_CAPSULE | Freq: Once | ORAL | Status: AC
  Administered 2021-01-27: 50 mg via ORAL

## 2021-01-27 NOTE — Patient Instructions (Signed)
[facility] Discharge Instructions for Patients Receiving Chemotherapy  Today you received the following chemotherapy agents Trastuzumab-anns (KANJINTI).  To help prevent nausea and vomiting after your treatment, we encourage you to take your nausea medication as prescribed.   If you develop nausea and vomiting that is not controlled by your nausea medication, call the clinic.   BELOW ARE SYMPTOMS THAT SHOULD BE REPORTED IMMEDIATELY:  *FEVER GREATER THAN 100.5 F  *CHILLS WITH OR WITHOUT FEVER  NAUSEA AND VOMITING THAT IS NOT CONTROLLED WITH YOUR NAUSEA MEDICATION  *UNUSUAL SHORTNESS OF BREATH  *UNUSUAL BRUISING OR BLEEDING  TENDERNESS IN MOUTH AND THROAT WITH OR WITHOUT PRESENCE OF ULCERS  *URINARY PROBLEMS  *BOWEL PROBLEMS  UNUSUAL RASH Items with * indicate a potential emergency and should be followed up as soon as possible.  Feel free to call the clinic should you have any questions or concerns. The clinic phone number is [phone].  Please show the CHEMO ALERT CARD at check-in to the Emergency Department and triage nurse.   

## 2021-01-27 NOTE — Progress Notes (Addendum)
Anesthesia Chart Review:  Patient noted to have new onset A. fib at preop testing appointment.  This was discovered incidentally on preop EKG.  She was asymptomatic.  She denied any palpitations.  She denies any history of atrial fibrillation.  She denies any shortness of breath or chest pain.  She has been somewhat limited in her activity due to orthopedic injuries.  She reports history of left lower extremity swelling that has been improved on Lasix, states this is followed by her PCP Dr. Maurice [name].  She reports that she is currently undergoing chemotherapy for history of breast cancer s/p right breast lumpectomy February 2021, followed by Dr. Nicholas [name].  She says her last chemotherapy treatment at this afternoon. She says she has been under a lot of stress over the past year due to various health concerns. On exam she is in no acute distress, well-appearing, auscultation reveals irregularly irregular rhythm, normal rate, lungs clear to auscultation bilaterally, mild swelling of the left lower extremity.  She had recent echo 10/16/2020 as part of chemotherapy monitoring.  Study showed EF 60 to 65%, mild LVH, no significant valvular abnormalities.  Preop labs reviewed, unremarkable.  Discussed with patient she will need preop cardiology evaluation due to new onset A. fib.  She has been scheduled to see Dr. Eleonore [name] on 01/28/2021 at 3 PM.  Ability to proceed with surgery pending his evaluation.  Addendum 01/28/21: Pt seen by Dr. Audie [name] on 2/9 and surgery was discussed. Per note, "Preoperative cardiovascular examination -Euvolemic on exam.  No evidence of heart failure.  Recent echocardiogram normal.  New onset A. fib as above.  We will plan for rate control no anticoagulation this time.  I will then see her back in 2 weeks after surgery for further evaluation." She was started on metoprolol.   TTE 10/08/2020: 1. Left ventricular ejection fraction, by estimation, is 60 to 65%. The  left ventricle  has normal function. The left ventricle has no regional  wall motion abnormalities. There is mild concentric left ventricular  hypertrophy. Left ventricular diastolic  parameters are indeterminate.  2. Right ventricular systolic function is normal. The right ventricular  size is normal.  3. The mitral valve is normal in structure. Trivial mitral valve  regurgitation.  4. The aortic valve is normal in structure. Aortic valve regurgitation is  trivial.  5. The inferior vena cava is normal in size with <50% respiratory  variability, suggesting right atrial pressure of 8 mmHg.   Comparison(s): Compared to prior TTE on 06/03/20, there is no significant  change.    [name] [facility]/Anesthesiology Phone [phone] 01/27/2021 1:41 PM

## 2021-01-27 NOTE — Progress Notes (Addendum)
PCP - Maurice Small Cardiologist - denies Oncology: Nicholas Lose  Chest x-ray - N/A EKG - 01/27/21 Stress Test - denies ECHO - 10/16/20 Cardiac Cath - denies  Sleep Study - denies  Aspirin Instructions: Patient instructed to hold all Aspirin, NSAID's, herbal medications, fish oil and vitamins 7 days prior to surgery.  Covid test- 01/31/21 at at [place]. Pt instructed to remain in their car. Educated on Transport planner until [employer].   Anesthesia review: new onset A.Fib on EKG. Karoline Caldwell, PA evaluated patient at PAT appointment and to review.  Patient denies shortness of breath, fever, cough and chest pain at PAT appointment   Patient verbalized understanding of instructions that were given to them at the PAT appointment. Patient was also instructed that they will need to review over the PAT instructions again at home before surgery.

## 2021-01-27 NOTE — Progress Notes (Addendum)
Surgical Instructions    Your procedure is scheduled on Monday February 14  Report to [facility] Main Entrance "A" at 0645 A.M., then check in with the Admitting office.  Call this number if you have problems the morning of surgery:  [phone]   If you have any questions prior to your surgery date call [phone]: Open Monday-Friday 8am-4pm    Remember:  Do not eat after midnight the night before your surgery  You may drink clear liquids until 0545 am the morning of your surgery.   Clear liquids allowed are: Water, Non-Citrus Juices (without pulp), Carbonated Beverages, Clear Tea, Black Coffee Only, and Gatorade    Take these medicines the morning of surgery with A SIP OF WATER  oxyCODONE-acetaminophen (PERCOCET/ROXICET)  If needed for pain   As of today, STOP taking any Aspirin (unless otherwise instructed by your surgeon) Aleve, Naproxen, Ibuprofen, Motrin, Advil, Goody's, BC's, all herbal medications, fish oil, and all vitamins.                     Do not wear jewelry, make up, or nail polish            Do not wear lotions, powders, perfumes, or deodorant.            Do not shave 48 hours prior to surgery.              Do not bring valuables to the hospital.            [facility] is not responsible for any belongings or valuables.  Do NOT Smoke (Tobacco/Vaping) or drink Alcohol 24 hours prior to your procedure If you use a CPAP at night, you may bring all equipment for your overnight stay.   Contacts, glasses, dentures or bridgework may not be worn into surgery, please bring cases for these belongings   For patients admitted to the hospital, discharge time will be determined by your treatment team.   Patients discharged the day of surgery will not be allowed to drive home, and someone needs to stay with them for 24 hours.    [place]- Preparing for Total Shoulder Arthroplasty  Before surgery, you can play an important role. Because skin is not sterile, your skin  needs to be as free of germs as possible. You can reduce the number of germs on your skin by using the following products.   Benzoyl Peroxide Gel  o Reduces the number of germs present on the skin  o Applied twice a day to shoulder area starting two days before surgery   Chlorhexidine Gluconate (CHG) Soap (instructions listed above on how to wash with CHG Soap)  o An antiseptic cleaner that kills germs and bonds with the skin to continue killing germs even after washing  o Used for showering the night before surgery and morning of surgery   ==================================================================  Please follow these instructions carefully:  BENZOYL PEROXIDE 5% GEL  Please do not use if you have an allergy to benzoyl peroxide. If your skin becomes reddened/irritated stop using the benzoyl peroxide.  Starting two days before surgery (Saturday 2/12), apply as follows:  1. Apply benzoyl peroxide in the morning and at night. Apply after taking a shower. If you are not taking a shower clean entire shoulder front, back, and side along with the armpit with a clean wet washcloth.  2. Place a quarter-sized dollop on your SHOULDER and rub in thoroughly, making sure to cover the front, back,  and side of your shoulder, along with the armpit.   2 Days prior to Surgery (Saturday) First Dose on ____2/12_________ Morning Second Dose on ___2/12___________ Night  Day Before Surgery (Sunday) First Dose on _____2/13_________ Morning Night before surgery wash (entire body except face and private areas) with CHG Soap THEN Second Dose on _____2/13_______ Night   Morning of Surgery (Monday) wash BODY AGAIN with CHG Soap   4. Do NOT apply benzoyl peroxide gel on the day of surgery   CHG is an antiseptic cleaner which kills germs and bonds with the skin to continue killing germs even after washing.    Oral Hygiene is also important to reduce your risk of infection.  Remember - BRUSH  YOUR TEETH THE MORNING OF SURGERY WITH YOUR REGULAR TOOTHPASTE  Please do not use if you have an allergy to CHG or antibacterial soaps. If your skin becomes reddened/irritated stop using the CHG.  Do not shave (including legs and underarms) for at least 48 hours prior to first CHG shower. It is OK to shave your face.  Please follow these instructions carefully.   1. If you chose to wash your hair, wash your hair first as usual with your normal shampoo.  2. After you shampoo, rinse your hair and body thoroughly to remove the shampoo.  3. Wash Face and genitals (private parts) with your normal soap.   4. THEN Shower the NIGHT BEFORE SURGERY and the MORNING OF SURGERY with CHG Soap.   5. Use CHG as you would any other liquid soap. You can apply CHG directly to the skin and wash gently with a scrungie or a clean washcloth.   6. Apply the CHG Soap to your body ONLY FROM THE NECK DOWN.  Do not use on open wounds or open sores. Avoid contact with your eyes, ears, mouth and genitals (private parts). Wash Face and genitals (private parts)  with your normal soap.   7. Wash thoroughly, paying special attention to the area where your surgery will be performed.  8. Thoroughly rinse your body with warm water from the neck down.  9. DO NOT shower/wash with your normal soap after using and rinsing off the CHG Soap.  10. Pat yourself dry with a CLEAN TOWEL.  11. Wear CLEAN PAJAMAS to bed the night before surgery  12. Place CLEAN SHEETS on your bed the night before your surgery  13. DO NOT SLEEP WITH PETS.   Day of Surgery: Wear Clean/Comfortable clothing the morning of surgery Do not apply any deodorants/lotions.   Remember to brush your teeth WITH YOUR REGULAR TOOTHPASTE.   Please read over the following fact sheets that you were given.

## 2021-01-27 NOTE — Progress Notes (Signed)
Cardiology Office Note:   Date:  01/28/2021  NAME:  [name]    MRN: [id number] DOB:  1951/03/30   PCP:  Maurice Small, MD  Cardiologist:  No primary care provider on file.   Referring MD: Maurice Small, MD   Chief Complaint  Patient presents with  . Atrial Fibrillation   History of Present Illness:   [name] is a 70 y.o. female with a hx of breast cancer who is being seen today for the evaluation of atrial fibrillation at the request of Maurice Small, MD. Seen in preop clinic and concerns for Afib. Sent for evaluation.  She will undergo port removal this Friday.  She will then have shoulder surgery on Monday.  Her preoperative assessment was delayed due to her atrial fibrillation.  She presents today for further evaluation.  EKG in office demonstrates rate controlled A. fib with heart rate 90.  She denies any symptoms from this.  No chest pain no shortness of breath.  She reports she can, flight of stairs without any limitations.  She had echocardiograms that are normal.  Of note her TSH was very abnormal in June.  Most recent TSH from what I can tell was 12.3.  No repeat lab values.  No history of heart disease.  She is never had a heart attack or stroke.  She is obese with a BMI of 36.  No sleep apnea reported.  History of hypertension.  BP 150/90.  She reports she is very stressed out she would like to have surgery.  She does have a history of lower extremity edema in the left leg.  This was worked up last year and DVT studies were negative.  She was prescribed Lasix without identifiable etiology of the swelling.  Symptoms have improved.  No recent change in medications.  She completed her trastuzumab therapy.  No history of congestive heart failure no symptoms of this today.  Family history significant for stroke in her father.  She is a former smoker of 20 years but quit a number of years ago.  She does drink alcohol daily, does not appear to be in excess.  She reports no illicit drug  use.  She is retired Radio producer.  She has 1 daughter and 3 grandchildren.  Problem List 1. Stage IA Breast Cancer -ER+/PR+/HER2+ -lumpectomy 01/22/2020 -radiation 05/2020-06/2020 -s/p adjuvant chemo -on trastuzumab maintenance (completed 01/27/2021) 2. HTN 3. Obesity -BMI 36 4. Former Smoker -49 years   Past Medical History: Past Medical History:  Diagnosis Date  . Anxiety   . Cancer (HCC)    breast  . Chronic pain   . Concussion 3/08   ICU x 3 days  . Depression   . Hypertension   . Hypothyroidism   . Spinal stenosis   . Thyroid disease ?1994    Past Surgical History: Past Surgical History:  Procedure Laterality Date  . BREAST LUMPECTOMY WITH RADIOACTIVE SEED AND SENTINEL LYMPH NODE BIOPSY Right 01/22/2020   Procedure: RIGHT BREAST LUMPECTOMY WITH RADIOACTIVE SEED X2 AND RIGHT SENTINEL LYMPH NODE MAPPING;  Surgeon: Erroll Luna, MD;  Location: [facility];  Service: General;  Laterality: Right;  . BREAST SURGERY Right 4/99   breast biopsy, benign  . EYE SURGERY Right   . PORTACATH PLACEMENT Right 01/22/2020   Procedure: INSERTION PORT-A-CATH WITH ULTRASOUND;  Surgeon: Erroll Luna, MD;  Location: [facility];  Service: General;  Laterality: Right;  . PORTACATH PLACEMENT Right 02/28/2020   Procedure: PORT A  CATH REVISION;  Surgeon: Erroll Luna, MD;  Location: [facility];  Service: General;  Laterality: Right;  . SHOULDER SURGERY Right   . SPINAL FUSION  03/04/11   with ORIF    Current Medications: Current Meds  Medication Sig  . amitriptyline (ELAVIL) 25 MG tablet Take 75 mg by mouth at bedtime.   Marland Kitchen amLODipine (NORVASC) 5 MG tablet Take 7.5 mg by mouth daily in the afternoon.  . diazepam (VALIUM) 10 MG tablet TAKE 1 TABLET BY MOUTH AT BEDTIME IF NEEDED FOR SLEEP.  . furosemide (LASIX) 20 MG tablet Take 1 tablet (20 mg total) by mouth daily as needed for fluid or edema.  Marland Kitchen ibuprofen (ADVIL) 200 MG tablet Take 400 mg by mouth every 8  (eight) hours as needed (pain.).  Marland Kitchen levothyroxine (SYNTHROID) 150 MCG tablet Take 150 mcg by mouth daily in the afternoon.  . lidocaine-prilocaine (EMLA) cream Apply to affected area once  . loperamide (IMODIUM A-D) 2 MG tablet Take 2-4 mg by mouth 4 (four) times daily as needed for diarrhea or loose stools.  . metoprolol tartrate (LOPRESSOR) 25 MG tablet Take 1 tablet (25 mg total) by mouth 2 (two) times daily.  Marland Kitchen saccharomyces boulardii (FLORASTOR) 250 MG capsule Take 1 capsule (250 mg total) by mouth 2 (two) times daily.  Marland Kitchen venlafaxine XR (EFFEXOR-XR) 150 MG 24 hr capsule Take 150 mg by mouth daily in the afternoon.  . vitamin B-12 (CYANOCOBALAMIN) 1000 MCG tablet Take 1,000 mcg by mouth daily in the afternoon.     Allergies:    Ambien [zolpidem tartrate]   Social History: Social History   Socioeconomic History  . Marital status: Married    Spouse name: Not on file  . Number of children: 1  . Years of education: Not on file  . Highest education level: Not on file  Occupational History  . 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 Smoking status: Former Smoker    Years: 20.00    Types: Cigarettes  . Smokeless tobacco: Never Used  Vaping Use  . Vaping Use: Never used  Substance and Sexual Activity  . Alcohol use: Yes    Alcohol/week: 5.0 standard drinks    Types: 5 Standard drinks or equivalent per week    Comment: wine  . Drug use: No  . Sexual activity: Never    Partners: Male    Birth control/protection: Post-menopausal  Other Topics Concern  . Not on file  Social History Narrative  . Not on file   Social Determinants of Health   Financial Resource Strain: Not on file  Food Insecurity: Not on file  Transportation Needs: Not on file  Physical Activity: Not on file  Stress: Not on file  Social Connections: Not on file     Family History: The patient's family history includes Dementia in her mother; Diabetes in her father and sister; Hypertension in  her sister; Stroke in her father; Thyroid disease in her mother.  ROS:   All other ROS reviewed and negative. Pertinent positives noted in the HPI.     EKGs/Labs/Other Studies Reviewed:   The following studies were personally reviewed by me today:  EKG:  EKG is ordered today.  The ekg ordered today demonstrates atrial fibrillation heart rate 90, no acute ischemic changes or evidence of prior infarction, and was personally reviewed by me.   TTE 10/16/2020  1. Left ventricular ejection fraction, by estimation, is 60 to 65%. The  left ventricle has normal function. The  left ventricle has no regional  wall motion abnormalities. There is mild concentric left ventricular  hypertrophy. Left ventricular diastolic  parameters are indeterminate.  2. Right ventricular systolic function is normal. The right ventricular  size is normal.  3. The mitral valve is normal in structure. Trivial mitral valve  regurgitation.  4. The aortic valve is normal in structure. Aortic valve regurgitation is  trivial.  5. The inferior vena cava is normal in size with <50% respiratory  variability, suggesting right atrial pressure of 8 mmHg.   Recent Labs: 04/17/2020: Magnesium 1.7; TSH 30.410 01/06/2021: ALT 21 01/27/2021: BUN 18; Creatinine, Ser 0.81; Hemoglobin 13.5; Platelets 261; Potassium 3.9; Sodium 138   Recent Lipid Panel No results found for: CHOL, TRIG, HDL, CHOLHDL, VLDL, LDLCALC, LDLDIRECT  Physical Exam:   VS:  BP (!) 150/90   Pulse 90   Ht 5' 7.5" (1.715 m)   Wt 232 lb 6.4 oz (105.4 kg)   LMP  (LMP Unknown)   SpO2 97%   BMI 35.86 kg/m    Wt Readings from Last 3 Encounters:  01/28/21 232 lb 6.4 oz (105.4 kg)  01/27/21 200 lb (90.7 kg)  08/21/20 200 lb (90.7 kg)    General: Well nourished, well developed, in no acute distress Head: Atraumatic, normal size  Eyes: PEERLA, EOMI  Neck: Supple, no JVD Endocrine: No thryomegaly Cardiac: Normal S1, S2; irregular rhythm, no murmurs rubs or  gallops Lungs: Clear to auscultation bilaterally, no wheezing, rhonchi or rales  Abd: Soft, nontender, no hepatomegaly  Ext: No edema, pulses 2+ Musculoskeletal: No deformities, BUE and BLE strength normal and equal Skin: Warm and dry, no rashes   Neuro: Alert and oriented to person, place, time, and situation, CNII-XII grossly intact, no focal deficits  Psych: Normal mood and affect   ASSESSMENT:   [name] is a 70 y.o. female who presents for the following: 1. Persistent atrial fibrillation ([place])   2. Preoperative cardiovascular examination   3. Primary hypertension   4. Obesity (BMI 30-39.9)     PLAN:   1. Persistent atrial fibrillation (HCC) -CHADSVASC=3 (age, sex, HTN) -She presents for evaluation of atrial fibrillation.  No symptoms from this.  No chest pain or shortness of breath.  Echocardiogram in October 2020 was normal. -Risk factors for this include obesity, hypertension and advanced age.  Her thyroid studies were very abnormal in June.  She needs repeat TSH free T4 and T3 today. -She will have port removal as well as shoulder surgery on Friday and Monday, respectively.  She is rate controlled A. fib with no symptoms from this.  There is no evidence of heart failure.  I recommend she start metoprolol tartrate 25 twice a day and proceed with surgery.  I will plan to see her back in 2 weeks we will discuss anticoagulation and further management at that time.  I see no need to delay her surgery.  She does need repeat thyroid studies which I think may need adjustment. -No history of sleep apnea.  Obesity is concerning.  Hopefully she will be able to lose some weight as will help maintain normal rhythm.  We will plan to see her back in 2 weeks after her surgeries.  2. Preoperative cardiovascular examination -Euvolemic on exam.  No evidence of heart failure.  Recent echocardiogram normal.  New onset A. fib as above.  We will plan for rate control no anticoagulation this time.   I will then see her back in 2 weeks after  surgery for further evaluation.  3. Primary hypertension -BP elevated today.  We are starting metoprolol.  She is extremely stressed out.  I suspect this is the source of her high blood pressure.  4. Obesity (BMI 30-39.9) -Diet and exercise recommended.  Disposition: Return in about 2 weeks (around 02/11/2021).  Medication Adjustments/Labs and Tests Ordered: Current medicines are reviewed at length with the patient today.  Concerns regarding medicines are outlined above.  Orders Placed This Encounter  Procedures  . TSH  . T4, free  . T3   Meds ordered this encounter  Medications  . metoprolol tartrate (LOPRESSOR) 25 MG tablet    Sig: Take 1 tablet (25 mg total) by mouth 2 (two) times daily.    Dispense:  180 tablet    Refill:  3    Patient Instructions  Medication Instructions:  Start Metoprolol Tartrate 25 mg twice daily  *If you need a refill on your cardiac medications before your next appointment, please call your pharmacy*   Lab Work: TSH, T3, Free T4  If you have labs (blood work) drawn today and your tests are completely normal, you will receive your results only by: Marland Kitchen MyChart Message (if you have MyChart) OR . A paper copy in the mail If you have any lab test that is abnormal or we need to change your treatment, we will call you to review the results.   Follow-Up: At [facility], you and your health needs are our priority.  As part of our continuing mission to provide you with exceptional heart care, we have created designated Provider Care Teams.  These Care Teams include your primary Cardiologist (physician) and Advanced Practice Providers (APPs -  Physician Assistants and Nurse Practitioners) who all work together to provide you with the care you need, when you need it.  We recommend signing up for the patient portal called "MyChart".  Sign up information is provided on this After Visit Summary.  MyChart is used to  connect with patients for Virtual Visits (Telemedicine).  Patients are able to view lab/test results, encounter notes, upcoming appointments, etc.  Non-urgent messages can be sent to your provider as well.   To learn more about what you can do with MyChart, go to [email or web address].    Your next appointment:   2 week(s)  The format for your next appointment:   In Person  Provider:   Eleonore Chiquito, MD        Signed, Addison Naegeli. Audie Box, [place]  [address] [place],  [address] [phone]  01/28/2021 3:23 PM

## 2021-01-28 ENCOUNTER — Encounter: Payer: Self-pay | Admitting: Cardiovascular Disease

## 2021-01-28 ENCOUNTER — Ambulatory Visit (INDEPENDENT_AMBULATORY_CARE_PROVIDER_SITE_OTHER): Payer: Medicare PPO | Admitting: Cardiovascular Disease

## 2021-01-28 VITALS — BP 150/90 | HR 90 | Ht 67.5 in | Wt 232.4 lb

## 2021-01-28 DIAGNOSIS — I10 Essential (primary) hypertension: Secondary | ICD-10-CM | POA: Diagnosis not present

## 2021-01-28 DIAGNOSIS — E669 Obesity, unspecified: Secondary | ICD-10-CM

## 2021-01-28 DIAGNOSIS — Z01810 Encounter for preprocedural cardiovascular examination: Secondary | ICD-10-CM

## 2021-01-28 DIAGNOSIS — I4819 Other persistent atrial fibrillation: Secondary | ICD-10-CM | POA: Diagnosis not present

## 2021-01-28 MED ORDER — METOPROLOL TARTRATE 25 MG PO TABS: 25.0000 mg | ORAL_TABLET | Freq: Two times a day (BID) | ORAL | 3 refills | Status: DC

## 2021-01-28 NOTE — Patient Instructions (Addendum)
Medication Instructions:  Start Metoprolol Tartrate 25 mg twice daily  *If you need a refill on your cardiac medications before your next appointment, please call your pharmacy*   Lab Work: TSH, T3, Free T4  If you have labs (blood work) drawn today and your tests are completely normal, you will receive your results only by: Marland Kitchen MyChart Message (if you have MyChart) OR . A paper copy in the mail If you have any lab test that is abnormal or we need to change your treatment, we will call you to review the results.   Follow-Up: At [facility], you and your health needs are our priority.  As part of our continuing mission to provide you with exceptional heart care, we have created designated Provider Care Teams.  These Care Teams include your primary Cardiologist (physician) and Advanced Practice Providers (APPs -  Physician Assistants and Nurse Practitioners) who all work together to provide you with the care you need, when you need it.  We recommend signing up for the patient portal called "MyChart".  Sign up information is provided on this After Visit Summary.  MyChart is used to connect with patients for Virtual Visits (Telemedicine).  Patients are able to view lab/test results, encounter notes, upcoming appointments, etc.  Non-urgent messages can be sent to your provider as well.   To learn more about what you can do with MyChart, go to [email or web address].    Your next appointment:   2 week(s)  The format for your next appointment:   In Person  Provider:   Eleonore Chiquito, MD

## 2021-01-28 NOTE — Anesthesia Preprocedure Evaluation (Addendum)
Anesthesia Evaluation  Patient identified by MRN, date of birth, ID band Patient awake    Reviewed: Allergy & Precautions, NPO status , Patient's Chart, lab work & pertinent test results  Airway Mallampati: II  TM Distance: >3 FB Neck ROM: Full    Dental  (+) Teeth Intact, Dental Advisory Given   Pulmonary neg pulmonary ROS, former smoker,    Pulmonary exam normal breath sounds clear to auscultation       Cardiovascular hypertension, Pt. on medications and Pt. on home beta blockers + dysrhythmias Atrial Fibrillation  Rhythm:Irregular Rate:Normal  Echo 09/2020  1. Left ventricular ejection fraction, by estimation, is 60 to 65%. The left ventricle has normal function. The left ventricle has no regional wall motion abnormalities. There is mild concentric left ventricular hypertrophy. Left ventricular diastolic parameters are indeterminate.  2. Right ventricular systolic function is normal. The right ventricular size is normal.  3. The mitral valve is normal in structure. Trivial mitral valve regurgitation.  4. The aortic valve is normal in structure. Aortic valve regurgitation is trivial.  5. The inferior vena cava is normal in size with <50% respiratory variability, suggesting right atrial pressure of 8 mmHg.    Neuro/Psych PSYCHIATRIC DISORDERS Anxiety Depression negative neurological ROS     GI/Hepatic negative GI ROS, Neg liver ROS,   Endo/Other  Hypothyroidism   Renal/GU Renal disease     Musculoskeletal negative musculoskeletal ROS (+)   Abdominal (+) + obese,   Peds  Hematology  (+) Blood dyscrasia, anemia ,   Anesthesia Other Findings   Reproductive/Obstetrics                           Anesthesia Physical Anesthesia Plan  ASA: III  Anesthesia Plan: General   Post-op Pain Management: GA combined w/ Regional for post-op pain   Induction: Intravenous  PONV Risk Score and Plan: 3  and Ondansetron, Dexamethasone, Midazolam, Treatment may vary due to age or medical condition and Diphenhydramine  Airway Management Planned: Oral ETT  Additional Equipment: None  Intra-op Plan:   Post-operative Plan: Extubation in OR  Informed Consent: I have reviewed the patients History and Physical, chart, labs and discussed the procedure including the risks, benefits and alternatives for the proposed anesthesia with the patient or authorized representative who has indicated his/her understanding and acceptance.     Dental advisory given  Plan Discussed with: CRNA  Anesthesia Plan Comments: (Discussed with patient risk of stroke and inability to assess until post op. She understands the risks and wishes to proceed.   PAT note by Karoline Caldwell, PA-C: Patient noted to have new onset A. fib at preop testing appointment.  This was discovered incidentally on preop EKG.  She was asymptomatic.  She denied any palpitations.  She denies any history of atrial fibrillation.  She denies any shortness of breath or chest pain.  She has been somewhat limited in her activity due to orthopedic injuries.  She reports history of left lower extremity swelling that has been improved on Lasix, states this is followed by her PCP Dr. Maurice Small.  She reports that she is currently undergoing chemotherapy for history of breast cancer s/p right breast lumpectomy February 2021, followed by Dr. Nicholas Lose.  She says her last chemotherapy treatment at this afternoon. She says she has been under a lot of stress over the past year due to various health concerns. On exam she is in no acute distress, well-appearing, auscultation reveals  irregularly irregular rhythm, normal rate, lungs clear to auscultation bilaterally, mild swelling of the left lower extremity.  She had recent echo 10/16/2020 as part of chemotherapy monitoring.  Study showed EF 60 to 65%, mild LVH, no significant valvular abnormalities.  Preop labs  reviewed, unremarkable.  Discussed with patient she will need preop cardiology evaluation due to new onset A. fib.  She has been scheduled to see Dr. Eleonore Chiquito on 01/28/2021 at 3 PM.  Ability to proceed with surgery pending his evaluation.  Addendum 01/28/21: Pt seen by Dr. Audie Box on 2/9 and surgery was discussed. Per note, "Preoperative cardiovascular examination -Euvolemic on exam. No evidence of heart failure. Recent echocardiogram normal. New onset A. fib as above. We will plan for rate control no anticoagulation this time. I will then see her back in 2 weeks after surgery for further evaluation." She was started on metoprolol.   TTE 10/08/2020: 1. Left ventricular ejection fraction, by estimation, is 60 to 65%. The  left ventricle has normal function. The left ventricle has no regional  wall motion abnormalities. There is mild concentric left ventricular  hypertrophy. Left ventricular diastolic  parameters are indeterminate.  2. Right ventricular systolic function is normal. The right ventricular  size is normal.  3. The mitral valve is normal in structure. Trivial mitral valve  regurgitation.  4. The aortic valve is normal in structure. Aortic valve regurgitation is  trivial.  5. The inferior vena cava is normal in size with <50% respiratory  variability, suggesting right atrial pressure of 8 mmHg.   Comparison(s): Compared to prior TTE on 06/03/20, there is no significant  change.  )      Anesthesia Quick Evaluation

## 2021-01-29 ENCOUNTER — Other Ambulatory Visit (HOSPITAL_COMMUNITY): Payer: Medicare PPO

## 2021-01-29 LAB — TSH: TSH: 8.12 u[IU]/mL — ABNORMAL HIGH (ref 0.450–4.500)

## 2021-01-29 LAB — T4, FREE: Free T4: 1.2 ng/dL (ref 0.82–1.77)

## 2021-01-29 LAB — T3: T3, Total: 76 ng/dL (ref 71–180)

## 2021-01-30 ENCOUNTER — Telehealth: Payer: Self-pay | Admitting: Cardiovascular Disease

## 2021-01-30 ENCOUNTER — Ambulatory Visit: Payer: Self-pay | Admitting: Surgery

## 2021-01-30 DIAGNOSIS — Z95828 Presence of other vascular implants and grafts: Secondary | ICD-10-CM | POA: Diagnosis not present

## 2021-01-30 NOTE — Telephone Encounter (Signed)
Left message for patient, [name] has left for the day. Thyroid results are back and Dr. Audie Box advises patient to follow up with PCP for possible adjustment of thyroid medication. Dr. Audie Box sent message results to PCP as well.

## 2021-01-30 NOTE — Telephone Encounter (Signed)
Patient is returning Julie's call. Please advise.

## 2021-01-30 NOTE — H&P (Signed)
[name] Appointment: 01/30/2021 10:00 AM Location: [facility] Patient #: [id number] DOB: [birth year] Married / Language: English / Race: White Female  History of Present Illness Marcello Moores A. Canio Winokur MD; 01/30/2021 12:45 PM) Patient words: Patient returns for follow-up of her chemotherapy secondary to breast cancer port. She has finished chemotherapy and is ready to have her port removed.  The patient is a 70 year old female.   Allergies Janeann Forehand, CNA; 01/30/2021 9:56 AM) Ambien *HYPNOTICS/SEDATIVES/SLEEP DISORDER AGENTS* Hallucinations and confusion Allergies Reconciled  Medication History Janeann Forehand, CNA; 01/30/2021 9:57 AM) Medications Reconciled Vitamin D (Ergocalciferol) (1.25 MG(50000 UT) Capsule, Oral) Active. Amitriptyline HCl (25MG  Tablet, Oral) Active. Norvasc (5MG  Tablet, Oral) Active. Butrans (20MCG/HR Patch Weekly, Transdermal) Active. BuSpar (10MG  Tablet, Oral) Active. Cholecalciferol (25 MCG(1000 UT) Tablet, Oral) Active. PROzac (40MG  Capsule, Oral) Active. hydrOXYzine HCl (50MG  Tablet, Oral) Active. Levothyroxine Sodium (137MCG Tablet, Oral) Active.     Physical Exam (Currie Dennin A. Jaydy Fitzhenry MD; 01/30/2021 12:46 PM)  General Mental Status-Alert. General Appearance-Consistent with stated age. Hydration-Well hydrated. Voice-Normal.  Breast Note: Right chest port intact. Right breast scar healing nicely. Right axillary scar healing well. Left breast normal.  Neurologic Neurologic evaluation reveals -alert and oriented x 3 with no impairment of recent or remote memory. Mental Status-Normal.    Assessment & Plan (Avika Carbine A. Rinoa Garramone MD; 01/30/2021 12:46 PM)  PORT-A-CATH IN PLACE (Z66.063) Impression: port removal next month  Risk of bleeding, infection, catheter separation, catheter migration, embolus, blood clot, stroke, death, deep vein thrombosis discussed  Current Plans Pt Education - [facility] Free Text  Education/Instructions: discussed with patient and provided information.

## 2021-01-31 ENCOUNTER — Other Ambulatory Visit (HOSPITAL_COMMUNITY)
Admission: RE | Admit: 2021-01-31 | Discharge: 2021-01-31 | Disposition: A | Discharge status: Home / Self Care | Payer: Medicare PPO | Source: Ambulatory Visit | Attending: Orthopedic Surgery | Admitting: Orthopedic Surgery

## 2021-01-31 DIAGNOSIS — Z01812 Encounter for preprocedural laboratory examination: Secondary | ICD-10-CM | POA: Insufficient documentation

## 2021-01-31 DIAGNOSIS — Z20822 Contact with and (suspected) exposure to covid-19: Secondary | ICD-10-CM | POA: Diagnosis not present

## 2021-01-31 LAB — SARS CORONAVIRUS 2 (TAT 6-24 HRS): SARS Coronavirus 2: NEGATIVE

## 2021-02-01 NOTE — H&P (Signed)
[name] is an 70 y.o. female.   Chief Complaint: Left shoulder pain HPI: [name] is a 70 year old patient with left shoulder proximal humerus fracture nonunion and significant debilitating pain.  She has had this for many months.  CT scan shows nonunion of the proximal humerus fracture with not much glenoid deformity.  Scalloping of the humeral head is present.  There is bone loss and shortening present.  Tuberosities appear thinned but intact.  She has a history of breast cancer and recently had her port removed.  She has undergone a lot of treatment in 2021.  She describes significant functional limitations and daily pain with the left shoulder.  Past Medical History:  Diagnosis Date  . Anxiety   . Cancer (HCC)    breast  . Chronic pain   . Concussion 3/08   ICU x 3 days  . Depression   . Hypertension   . Hypothyroidism   . Spinal stenosis   . Thyroid disease ?1994    Past Surgical History:  Procedure Laterality Date  . BREAST LUMPECTOMY WITH RADIOACTIVE SEED AND SENTINEL LYMPH NODE BIOPSY Right 01/22/2020   Procedure: RIGHT BREAST LUMPECTOMY WITH RADIOACTIVE SEED X2 AND RIGHT SENTINEL LYMPH NODE MAPPING;  Surgeon: Erroll Luna, MD;  Location: [facility];  Service: General;  Laterality: Right;  . BREAST SURGERY Right 4/99   breast biopsy, benign  . EYE SURGERY Right   . PORTACATH PLACEMENT Right 01/22/2020   Procedure: INSERTION PORT-A-CATH WITH ULTRASOUND;  Surgeon: Erroll Luna, MD;  Location: [facility];  Service: General;  Laterality: Right;  . PORTACATH PLACEMENT Right 02/28/2020   Procedure: PORT A CATH REVISION;  Surgeon: Erroll Luna, MD;  Location: [facility];  Service: General;  Laterality: Right;  . SHOULDER SURGERY Right   . SPINAL FUSION  03/04/11   with ORIF    Family History  Problem Relation Age of Onset  . Thyroid disease Mother   . Dementia Mother   . Diabetes Father   . Stroke Father   . Hypertension Sister   . Diabetes  Sister    Social History:  reports that she has quit smoking. Her smoking use included cigarettes. She quit after 20.00 years of use. She has never used smokeless tobacco. She reports current alcohol use of about 5.0 standard drinks of alcohol per week. She reports that she does not use drugs.  Allergies:  Allergies  Allergen Reactions  . Ambien [Zolpidem Tartrate] Other (See Comments)    Hallucinations and Confusion    No medications prior to admission.    Results for orders placed or performed during the hospital encounter of 01/31/21 (from the past 48 hour(s))  SARS CORONAVIRUS 2 (TAT 6-24 HRS) Nasopharyngeal Nasopharyngeal Swab     Status: None   Collection Time: 01/31/21 10:57 AM   Specimen: Nasopharyngeal Swab  Result Value Ref Range   SARS Coronavirus 2 NEGATIVE NEGATIVE    Comment: (NOTE) SARS-CoV-2 target nucleic acids are NOT DETECTED.  The SARS-CoV-2 RNA is generally detectable in upper and lower respiratory specimens during the acute phase of infection. Negative results do not preclude SARS-CoV-2 infection, do not rule out co-infections with other pathogens, and should not be used as the sole basis for treatment or other patient management decisions. Negative results must be combined with clinical observations, patient history, and epidemiological information. The expected result is Negative.  Fact Sheet for Patients: SugarRoll.be  Fact Sheet for Healthcare Providers: https://www.woods-mathews.com/  This test is not yet  approved or cleared by the [place] and  has been authorized for detection and/or diagnosis of SARS-CoV-2 by FDA under an Emergency Use Authorization (EUA). This EUA will remain  in effect (meaning this test can be used) for the duration of the COVID-19 declaration under Se ction 564(b)(1) of the Act, 21 U.S.C. section 360bbb-3(b)(1), unless the authorization is terminated or revoked  sooner.  Performed at [facility], [place] [address], [place], [place] [address]    No results found.  Review of Systems  Musculoskeletal: Positive for arthralgias.  All other systems reviewed and are negative.   There were no vitals taken for this visit. Physical Exam Vitals reviewed.  HENT:     Head: Normocephalic.     Nose: Nose normal.     Mouth/Throat:     Mouth: Mucous membranes are moist.  Eyes:     Pupils: Pupils are equal, round, and reactive to light.  Cardiovascular:     Rate and Rhythm: Normal rate.     Pulses: Normal pulses.  Pulmonary:     Effort: Pulmonary effort is normal.  Abdominal:     General: Abdomen is flat.  Musculoskeletal:     Cervical back: Normal range of motion.  Skin:    General: Skin is warm.     Capillary Refill: Capillary refill takes less than 2 seconds.  Neurological:     General: No focal deficit present.     Mental Status: She is alert.  Psychiatric:        Mood and Affect: Mood normal.    Ortho exam demonstrates functional deltoid on the left-hand side with forward flexion and abduction both well below 90 degrees.  External rotation of 50 degrees of abduction is about 45 degrees which is painful.  Motor sensory function to the hands intact.  No lymphadenopathy is present in that left shoulder girdle region.  Neck range of motion is full.   Assessment/Plan  Impression is left shoulder proximal humerus fracture nonunion with scalloping of the humeral head and pain with capsular contracture around the shoulder joint.  Deltoid does fire.  Plan is reverse shoulder replacement.  Risk benefits are discussed with the patient including but not limited to infection nerve vessel damage dislocation incomplete pain relief as well as incomplete restoration of function.  At the time of this dictation we did draw albumin level which was above four and also vitamin D level which was twenty.  We will supplement her vitamin D in order to enhance the  chances for tuberosity healing.    She has been supplemented with vitamin D for the past 5 week..    This was done to enhance the chance for tuberosity healing. All questions answered  Anderson Malta, MD 02/01/2021, 11:38 PM

## 2021-02-02 ENCOUNTER — Ambulatory Visit (HOSPITAL_COMMUNITY): Payer: Medicare PPO | Admitting: Anesthesiology

## 2021-02-02 ENCOUNTER — Other Ambulatory Visit: Payer: Self-pay

## 2021-02-02 ENCOUNTER — Ambulatory Visit (HOSPITAL_COMMUNITY): Payer: Medicare PPO | Admitting: Physician Assistant

## 2021-02-02 ENCOUNTER — Encounter (HOSPITAL_COMMUNITY): Payer: Self-pay | Admitting: Orthopedic Surgery

## 2021-02-02 ENCOUNTER — Observation Stay (HOSPITAL_COMMUNITY): Payer: Medicare PPO

## 2021-02-02 ENCOUNTER — Observation Stay (HOSPITAL_COMMUNITY)
Admission: RE | Admit: 2021-02-02 | Discharge: 2021-02-06 | Disposition: A | Discharge status: Home / Self Care | Payer: Medicare PPO | Attending: Orthopedic Surgery | Admitting: Orthopedic Surgery

## 2021-02-02 ENCOUNTER — Encounter (HOSPITAL_COMMUNITY)
Admission: RE | Disposition: A | Discharge status: Home / Self Care | Payer: Self-pay | Source: Home / Self Care | Attending: Orthopedic Surgery

## 2021-02-02 DIAGNOSIS — S42292K Other displaced fracture of upper end of left humerus, subsequent encounter for fracture with nonunion: Secondary | ICD-10-CM | POA: Diagnosis not present

## 2021-02-02 DIAGNOSIS — G8918 Other acute postprocedural pain: Secondary | ICD-10-CM | POA: Diagnosis not present

## 2021-02-02 DIAGNOSIS — Z7982 Long term (current) use of aspirin: Secondary | ICD-10-CM | POA: Diagnosis not present

## 2021-02-02 DIAGNOSIS — Z9889 Other specified postprocedural states: Secondary | ICD-10-CM

## 2021-02-02 DIAGNOSIS — Z96612 Presence of left artificial shoulder joint: Secondary | ICD-10-CM

## 2021-02-02 DIAGNOSIS — X58XXXS Exposure to other specified factors, sequela: Secondary | ICD-10-CM | POA: Diagnosis not present

## 2021-02-02 DIAGNOSIS — E039 Hypothyroidism, unspecified: Secondary | ICD-10-CM | POA: Diagnosis not present

## 2021-02-02 DIAGNOSIS — Z20822 Contact with and (suspected) exposure to covid-19: Secondary | ICD-10-CM | POA: Diagnosis not present

## 2021-02-02 DIAGNOSIS — S42202K Unspecified fracture of upper end of left humerus, subsequent encounter for fracture with nonunion: Secondary | ICD-10-CM | POA: Diagnosis not present

## 2021-02-02 DIAGNOSIS — Z853 Personal history of malignant neoplasm of breast: Secondary | ICD-10-CM | POA: Insufficient documentation

## 2021-02-02 DIAGNOSIS — M19012 Primary osteoarthritis, left shoulder: Secondary | ICD-10-CM | POA: Diagnosis not present

## 2021-02-02 DIAGNOSIS — Z87891 Personal history of nicotine dependence: Secondary | ICD-10-CM | POA: Diagnosis not present

## 2021-02-02 DIAGNOSIS — I10 Essential (primary) hypertension: Secondary | ICD-10-CM | POA: Insufficient documentation

## 2021-02-02 DIAGNOSIS — Z79899 Other long term (current) drug therapy: Secondary | ICD-10-CM | POA: Insufficient documentation

## 2021-02-02 DIAGNOSIS — E876 Hypokalemia: Secondary | ICD-10-CM | POA: Diagnosis not present

## 2021-02-02 HISTORY — PX: REVERSE SHOULDER ARTHROPLASTY: SHX5054

## 2021-02-02 SURGERY — ARTHROPLASTY, SHOULDER, TOTAL, REVERSE
Anesthesia: General | Site: Shoulder | Laterality: Left

## 2021-02-02 MED ORDER — LACTATED RINGERS IV SOLN: INTRAVENOUS | Status: DC

## 2021-02-02 MED ORDER — CEFAZOLIN SODIUM-DEXTROSE 2-4 GM/100ML-% IV SOLN
2.0000 g | Freq: Three times a day (TID) | INTRAVENOUS | Status: AC
  Administered 2021-02-02 (×2): 2 g via INTRAVENOUS
  Filled 2021-02-02 (×2): qty 100

## 2021-02-02 MED ORDER — PROPOFOL 10 MG/ML IV BOLUS
INTRAVENOUS | Status: DC | PRN
  Administered 2021-02-02: 140 mg via INTRAVENOUS
  Administered 2021-02-02: 40 mg via INTRAVENOUS

## 2021-02-02 MED ORDER — ROCURONIUM BROMIDE 10 MG/ML (PF) SYRINGE
PREFILLED_SYRINGE | INTRAVENOUS | Status: DC | PRN
  Administered 2021-02-02: 50 mg via INTRAVENOUS

## 2021-02-02 MED ORDER — LEVOTHYROXINE SODIUM 150 MCG PO TABS
150.0000 ug | ORAL_TABLET | Freq: Every day | ORAL | Status: DC
  Administered 2021-02-03 – 2021-02-06 (×4): 150 ug via ORAL
  Filled 2021-02-02: qty 1
  Filled 2021-02-02 (×4): qty 2
  Filled 2021-02-02 (×4): qty 1

## 2021-02-02 MED ORDER — FENTANYL CITRATE (PF) 250 MCG/5ML IJ SOLN
INTRAMUSCULAR | Status: AC
  Filled 2021-02-02: qty 5

## 2021-02-02 MED ORDER — VENLAFAXINE HCL ER 150 MG PO CP24
150.0000 mg | ORAL_CAPSULE | Freq: Every day | ORAL | Status: DC
  Administered 2021-02-02 – 2021-02-06 (×5): 150 mg via ORAL
  Filled 2021-02-02 (×5): qty 1

## 2021-02-02 MED ORDER — SUCCINYLCHOLINE CHLORIDE 200 MG/10ML IV SOSY
PREFILLED_SYRINGE | INTRAVENOUS | Status: AC
  Filled 2021-02-02: qty 10

## 2021-02-02 MED ORDER — EPHEDRINE SULFATE-NACL 50-0.9 MG/10ML-% IV SOSY
PREFILLED_SYRINGE | INTRAVENOUS | Status: DC | PRN
  Administered 2021-02-02: 5 mg via INTRAVENOUS

## 2021-02-02 MED ORDER — PROPOFOL 10 MG/ML IV BOLUS
INTRAVENOUS | Status: AC
  Filled 2021-02-02: qty 20

## 2021-02-02 MED ORDER — METHOCARBAMOL 500 MG PO TABS
ORAL_TABLET | ORAL | Status: AC
  Filled 2021-02-02: qty 1

## 2021-02-02 MED ORDER — FENTANYL CITRATE (PF) 100 MCG/2ML IJ SOLN
INTRAMUSCULAR | Status: AC
  Administered 2021-02-02: 50 ug via INTRAVENOUS
  Filled 2021-02-02: qty 2

## 2021-02-02 MED ORDER — VANCOMYCIN HCL 1000 MG IV SOLR
INTRAVENOUS | Status: AC
  Filled 2021-02-02: qty 1000

## 2021-02-02 MED ORDER — DEXAMETHASONE SODIUM PHOSPHATE 10 MG/ML IJ SOLN
INTRAMUSCULAR | Status: AC
  Filled 2021-02-02: qty 1

## 2021-02-02 MED ORDER — ORAL CARE MOUTH RINSE: 15.0000 mL | Freq: Once | OROMUCOSAL | Status: AC

## 2021-02-02 MED ORDER — BUPIVACAINE HCL (PF) 0.5 % IJ SOLN
INTRAMUSCULAR | Status: DC | PRN
  Administered 2021-02-02: 10 mL via PERINEURAL

## 2021-02-02 MED ORDER — DOCUSATE SODIUM 100 MG PO CAPS
100.0000 mg | ORAL_CAPSULE | Freq: Two times a day (BID) | ORAL | Status: DC
  Administered 2021-02-03: 100 mg via ORAL
  Filled 2021-02-02 (×9): qty 1

## 2021-02-02 MED ORDER — FENTANYL CITRATE (PF) 100 MCG/2ML IJ SOLN
INTRAMUSCULAR | Status: AC
  Filled 2021-02-02: qty 2

## 2021-02-02 MED ORDER — PHENOL 1.4 % MT LIQD: 1.0000 | OROMUCOSAL | Status: DC | PRN

## 2021-02-02 MED ORDER — FENTANYL CITRATE (PF) 100 MCG/2ML IJ SOLN
50.0000 ug | Freq: Once | INTRAMUSCULAR | Status: AC
Start: 2021-02-02 — End: 2021-02-02
  Filled 2021-02-02: qty 1

## 2021-02-02 MED ORDER — 0.9 % SODIUM CHLORIDE (POUR BTL) OPTIME
TOPICAL | Status: DC | PRN
  Administered 2021-02-02 (×5): 1000 mL

## 2021-02-02 MED ORDER — BUPIVACAINE LIPOSOME 1.3 % IJ SUSP
INTRAMUSCULAR | Status: DC | PRN
  Administered 2021-02-02: 10 mL via PERINEURAL

## 2021-02-02 MED ORDER — PHENYLEPHRINE 40 MCG/ML (10ML) SYRINGE FOR IV PUSH (FOR BLOOD PRESSURE SUPPORT): PREFILLED_SYRINGE | INTRAVENOUS | Status: DC | PRN

## 2021-02-02 MED ORDER — PHENYLEPHRINE HCL-NACL 10-0.9 MG/250ML-% IV SOLN
INTRAVENOUS | Status: DC | PRN
  Administered 2021-02-02: 20 ug/min via INTRAVENOUS
  Administered 2021-02-02: 60 ug/min via INTRAVENOUS
  Administered 2021-02-02: 75 ug/min via INTRAVENOUS

## 2021-02-02 MED ORDER — LIDOCAINE 2% (20 MG/ML) 5 ML SYRINGE
INTRAMUSCULAR | Status: DC | PRN
  Administered 2021-02-02: 100 mg via INTRAVENOUS

## 2021-02-02 MED ORDER — FENTANYL CITRATE (PF) 100 MCG/2ML IJ SOLN
25.0000 ug | INTRAMUSCULAR | Status: DC | PRN
  Administered 2021-02-02 (×2): 50 ug via INTRAVENOUS

## 2021-02-02 MED ORDER — METOCLOPRAMIDE HCL 5 MG/ML IJ SOLN: 5.0000 mg | Freq: Three times a day (TID) | INTRAMUSCULAR | Status: DC | PRN

## 2021-02-02 MED ORDER — ONDANSETRON HCL 4 MG/2ML IJ SOLN
INTRAMUSCULAR | Status: AC
  Filled 2021-02-02: qty 2

## 2021-02-02 MED ORDER — ASPIRIN EC 81 MG PO TBEC
81.0000 mg | DELAYED_RELEASE_TABLET | Freq: Every day | ORAL | Status: DC
  Administered 2021-02-02 – 2021-02-06 (×5): 81 mg via ORAL
  Filled 2021-02-02 (×5): qty 1

## 2021-02-02 MED ORDER — MIDAZOLAM HCL 2 MG/2ML IJ SOLN
INTRAMUSCULAR | Status: AC
  Administered 2021-02-02: 2 mg via INTRAVENOUS
  Filled 2021-02-02: qty 2

## 2021-02-02 MED ORDER — ONDANSETRON HCL 4 MG/2ML IJ SOLN: 4.0000 mg | Freq: Four times a day (QID) | INTRAMUSCULAR | Status: DC | PRN

## 2021-02-02 MED ORDER — CHLORHEXIDINE GLUCONATE 0.12 % MT SOLN
15.0000 mL | Freq: Once | OROMUCOSAL | Status: AC
  Administered 2021-02-02: 15 mL via OROMUCOSAL
  Filled 2021-02-02: qty 15

## 2021-02-02 MED ORDER — IRRISEPT - 450ML BOTTLE WITH 0.05% CHG IN STERILE WATER, USP 99.95% OPTIME
TOPICAL | Status: DC | PRN
  Administered 2021-02-02: 450 mL via TOPICAL

## 2021-02-02 MED ORDER — TRANEXAMIC ACID-NACL 1000-0.7 MG/100ML-% IV SOLN
1000.0000 mg | INTRAVENOUS | Status: AC
  Administered 2021-02-02: 1000 mg via INTRAVENOUS
  Filled 2021-02-02: qty 100

## 2021-02-02 MED ORDER — ONDANSETRON HCL 4 MG PO TABS: 4.0000 mg | ORAL_TABLET | Freq: Four times a day (QID) | ORAL | Status: DC | PRN

## 2021-02-02 MED ORDER — EPHEDRINE 5 MG/ML INJ
INTRAVENOUS | Status: AC
  Filled 2021-02-02: qty 10

## 2021-02-02 MED ORDER — MIDAZOLAM HCL 2 MG/2ML IJ SOLN
2.0000 mg | Freq: Once | INTRAMUSCULAR | Status: AC
  Filled 2021-02-02: qty 2

## 2021-02-02 MED ORDER — ROCURONIUM BROMIDE 10 MG/ML (PF) SYRINGE
PREFILLED_SYRINGE | INTRAVENOUS | Status: AC
  Filled 2021-02-02: qty 10

## 2021-02-02 MED ORDER — AMISULPRIDE (ANTIEMETIC) 5 MG/2ML IV SOLN: 10.0000 mg | Freq: Once | INTRAVENOUS | Status: DC | PRN

## 2021-02-02 MED ORDER — AMLODIPINE BESYLATE 5 MG PO TABS
7.5000 mg | ORAL_TABLET | Freq: Every day | ORAL | Status: DC
  Administered 2021-02-02 – 2021-02-06 (×5): 7.5 mg via ORAL
  Filled 2021-02-02 (×5): qty 1

## 2021-02-02 MED ORDER — ONDANSETRON HCL 4 MG/2ML IJ SOLN
INTRAMUSCULAR | Status: DC | PRN
  Administered 2021-02-02: 4 mg via INTRAVENOUS

## 2021-02-02 MED ORDER — GABAPENTIN 300 MG PO CAPS
300.0000 mg | ORAL_CAPSULE | Freq: Three times a day (TID) | ORAL | Status: DC
  Administered 2021-02-02 – 2021-02-06 (×14): 300 mg via ORAL
  Filled 2021-02-02 (×10): qty 1
  Filled 2021-02-02: qty 3
  Filled 2021-02-02 (×3): qty 1

## 2021-02-02 MED ORDER — DEXAMETHASONE SODIUM PHOSPHATE 10 MG/ML IJ SOLN
INTRAMUSCULAR | Status: DC | PRN
  Administered 2021-02-02: 10 mg via INTRAVENOUS

## 2021-02-02 MED ORDER — ACETAMINOPHEN 500 MG PO TABS
1000.0000 mg | ORAL_TABLET | Freq: Four times a day (QID) | ORAL | Status: AC
  Administered 2021-02-02 – 2021-02-03 (×4): 1000 mg via ORAL
  Filled 2021-02-02 (×4): qty 2

## 2021-02-02 MED ORDER — LOPERAMIDE HCL 2 MG PO CAPS
2.0000 mg | ORAL_CAPSULE | Freq: Four times a day (QID) | ORAL | Status: DC | PRN
  Administered 2021-02-02: 4 mg via ORAL
  Filled 2021-02-02: qty 2

## 2021-02-02 MED ORDER — LIDOCAINE 2% (20 MG/ML) 5 ML SYRINGE
INTRAMUSCULAR | Status: AC
  Filled 2021-02-02: qty 5

## 2021-02-02 MED ORDER — VANCOMYCIN HCL 1000 MG IV SOLR
INTRAVENOUS | Status: DC | PRN
  Administered 2021-02-02: 1000 mg

## 2021-02-02 MED ORDER — HYDROMORPHONE HCL 1 MG/ML IJ SOLN
0.5000 mg | INTRAMUSCULAR | Status: DC | PRN
  Administered 2021-02-03 – 2021-02-06 (×9): 0.5 mg via INTRAVENOUS
  Filled 2021-02-02 (×11): qty 0.5

## 2021-02-02 MED ORDER — METOPROLOL TARTRATE 25 MG PO TABS
25.0000 mg | ORAL_TABLET | Freq: Two times a day (BID) | ORAL | Status: DC
  Administered 2021-02-02 – 2021-02-06 (×10): 25 mg via ORAL
  Filled 2021-02-02 (×10): qty 1

## 2021-02-02 MED ORDER — OXYCODONE HCL 5 MG PO TABS
5.0000 mg | ORAL_TABLET | ORAL | Status: DC | PRN
Start: 2021-02-02 — End: 2021-02-07
  Administered 2021-02-02 – 2021-02-04 (×8): 10 mg via ORAL
  Administered 2021-02-04: 5 mg via ORAL
  Administered 2021-02-04 – 2021-02-06 (×9): 10 mg via ORAL
  Filled 2021-02-02: qty 2
  Filled 2021-02-02: qty 1
  Filled 2021-02-02 (×6): qty 2
  Filled 2021-02-02 (×2): qty 1
  Filled 2021-02-02 (×3): qty 2
  Filled 2021-02-02: qty 1
  Filled 2021-02-02 (×6): qty 2

## 2021-02-02 MED ORDER — METHOCARBAMOL 1000 MG/10ML IJ SOLN
500.0000 mg | Freq: Four times a day (QID) | INTRAVENOUS | Status: DC | PRN
  Filled 2021-02-02: qty 5

## 2021-02-02 MED ORDER — CEFAZOLIN SODIUM-DEXTROSE 2-4 GM/100ML-% IV SOLN
2.0000 g | INTRAVENOUS | Status: AC
  Administered 2021-02-02: 2 g via INTRAVENOUS
  Filled 2021-02-02: qty 100

## 2021-02-02 MED ORDER — POVIDONE-IODINE 7.5 % EX SOLN
Freq: Once | CUTANEOUS | Status: DC
  Filled 2021-02-02: qty 118

## 2021-02-02 MED ORDER — POVIDONE-IODINE 10 % EX SWAB: 2.0000 "application " | Freq: Once | CUTANEOUS | Status: DC

## 2021-02-02 MED ORDER — METOCLOPRAMIDE HCL 5 MG PO TABS: 5.0000 mg | ORAL_TABLET | Freq: Three times a day (TID) | ORAL | Status: DC | PRN

## 2021-02-02 MED ORDER — PHENYLEPHRINE 40 MCG/ML (10ML) SYRINGE FOR IV PUSH (FOR BLOOD PRESSURE SUPPORT)
PREFILLED_SYRINGE | INTRAVENOUS | Status: AC
  Filled 2021-02-02: qty 10

## 2021-02-02 MED ORDER — CELECOXIB 200 MG PO CAPS
200.0000 mg | ORAL_CAPSULE | Freq: Two times a day (BID) | ORAL | Status: DC
  Administered 2021-02-02 – 2021-02-06 (×9): 200 mg via ORAL
  Filled 2021-02-02 (×9): qty 1

## 2021-02-02 MED ORDER — PHENYLEPHRINE HCL (PRESSORS) 10 MG/ML IV SOLN
INTRAVENOUS | Status: AC
  Filled 2021-02-02: qty 1

## 2021-02-02 MED ORDER — FUROSEMIDE 20 MG PO TABS: 20.0000 mg | ORAL_TABLET | Freq: Every day | ORAL | Status: DC | PRN

## 2021-02-02 MED ORDER — GLYCOPYRROLATE PF 0.2 MG/ML IJ SOSY
PREFILLED_SYRINGE | INTRAMUSCULAR | Status: DC | PRN
  Administered 2021-02-02: .1 mg via INTRAVENOUS

## 2021-02-02 MED ORDER — SUGAMMADEX SODIUM 200 MG/2ML IV SOLN
INTRAVENOUS | Status: DC | PRN
  Administered 2021-02-02: 200 mg via INTRAVENOUS

## 2021-02-02 MED ORDER — METHOCARBAMOL 500 MG PO TABS
500.0000 mg | ORAL_TABLET | Freq: Four times a day (QID) | ORAL | Status: DC | PRN
  Administered 2021-02-02 – 2021-02-06 (×7): 500 mg via ORAL
  Filled 2021-02-02 (×6): qty 1

## 2021-02-02 MED ORDER — MENTHOL 3 MG MT LOZG: 1.0000 | LOZENGE | OROMUCOSAL | Status: DC | PRN

## 2021-02-02 MED ORDER — FENTANYL CITRATE (PF) 250 MCG/5ML IJ SOLN
INTRAMUSCULAR | Status: DC | PRN
  Administered 2021-02-02: 25 ug via INTRAVENOUS
  Administered 2021-02-02: 50 ug via INTRAVENOUS
  Administered 2021-02-02: 100 ug via INTRAVENOUS

## 2021-02-02 MED ORDER — PHENYLEPHRINE 40 MCG/ML (10ML) SYRINGE FOR IV PUSH (FOR BLOOD PRESSURE SUPPORT)
PREFILLED_SYRINGE | INTRAVENOUS | Status: DC | PRN
  Administered 2021-02-02 (×2): 80 ug via INTRAVENOUS

## 2021-02-02 SURGICAL SUPPLY — 94 items
AID PSTN UNV HD RSTRNT DISP (MISCELLANEOUS) ×1
ALCOHOL 70% 16 OZ (MISCELLANEOUS) ×2 IMPLANT
APL PRP STRL LF DISP 70% ISPRP (MISCELLANEOUS) ×1
AUG COMP REV MI TAPER ADAPTER (Joint) ×2 IMPLANT
AUGMENT COMP REV MI TAPR ADPTR (Joint) IMPLANT
BIT DRILL 2.7 W/STOP DISP (BIT) ×1 IMPLANT
BIT DRILL F/CENTRAL SCRW 3.2 (BIT) ×1
BIT DRILL F/CENTRAL SCRW 3.2MM (BIT) IMPLANT
BIT DRILL QUICK REL 1/8 2PK SL (DRILL) IMPLANT
BIT DRILL TWIST 2.7 (BIT) ×1 IMPLANT
BLADE SAW SGTL 13X75X1.27 (BLADE) ×2 IMPLANT
BRNG HUM +3 36 RVRS SHLDR (Shoulder) ×1 IMPLANT
BSPLAT GLND SM AUG TPR ADPR (Joint) ×1 IMPLANT
CHLORAPREP W/TINT 26 (MISCELLANEOUS) ×2 IMPLANT
CNTNR URN SCR LID CUP LEK RST (MISCELLANEOUS) IMPLANT
CONT SPEC 4OZ STRL OR WHT (MISCELLANEOUS) ×2
COOLER ICEMAN CLASSIC (MISCELLANEOUS) ×2 IMPLANT
COVER SURGICAL LIGHT HANDLE (MISCELLANEOUS) ×2 IMPLANT
COVER WAND RF STERILE (DRAPES) ×1 IMPLANT
DRAPE INCISE IOBAN 66X45 STRL (DRAPES) ×2 IMPLANT
DRAPE U-SHAPE 47X51 STRL (DRAPES) ×4 IMPLANT
DRILL BIT F/CENTRAL SCRW 3.2MM (BIT) ×2
DRILL QUICK RELEASE 1/8 INCH (DRILL) ×2
DRSG AQUACEL AG ADV 3.5X10 (GAUZE/BANDAGES/DRESSINGS) ×2 IMPLANT
ELECT BLADE 4.0 EZ CLEAN MEGAD (MISCELLANEOUS) ×2
ELECT REM PT RETURN 9FT ADLT (ELECTROSURGICAL) ×2
ELECTRODE BLDE 4.0 EZ CLN MEGD (MISCELLANEOUS) ×1 IMPLANT
ELECTRODE REM PT RTRN 9FT ADLT (ELECTROSURGICAL) ×1 IMPLANT
GAUZE SPONGE 4X4 12PLY STRL LF (GAUZE/BANDAGES/DRESSINGS) ×2 IMPLANT
GLENOID SPHERE STD STRL 36MM (Orthopedic Implant) ×1 IMPLANT
GLOVE ECLIPSE 7.0 STRL STRAW (GLOVE) ×2 IMPLANT
GLOVE ECLIPSE 8.0 STRL XLNG CF (GLOVE) ×2 IMPLANT
GLOVE SRG 8 PF TXTR STRL LF DI (GLOVE) ×1 IMPLANT
GLOVE SURG UNDER POLY LF SZ7 (GLOVE) ×2 IMPLANT
GLOVE SURG UNDER POLY LF SZ8 (GLOVE) ×2
GOWN STRL REUS W/ TWL LRG LVL3 (GOWN DISPOSABLE) ×2 IMPLANT
GOWN STRL REUS W/ TWL XL LVL3 (GOWN DISPOSABLE) IMPLANT
GOWN STRL REUS W/TWL LRG LVL3 (GOWN DISPOSABLE) ×4
GOWN STRL REUS W/TWL XL LVL3 (GOWN DISPOSABLE)
GUIDE MODEL REV SHLD LT (ORTHOPEDIC DISPOSABLE SUPPLIES) ×1 IMPLANT
HYDROGEN PEROXIDE 16OZ (MISCELLANEOUS) ×2 IMPLANT
JET LAVAGE IRRISEPT WOUND (IRRIGATION / IRRIGATOR) ×2
KIT BASIN OR (CUSTOM PROCEDURE TRAY) ×2 IMPLANT
KIT TURNOVER KIT B (KITS) ×2 IMPLANT
LAVAGE JET IRRISEPT WOUND (IRRIGATION / IRRIGATOR) ×1 IMPLANT
LOOP VESSEL MAXI BLUE (MISCELLANEOUS) ×2 IMPLANT
MANIFOLD NEPTUNE II (INSTRUMENTS) ×2 IMPLANT
NDL SUT 2 .5 CRC MAYO 1.732X (NEEDLE) IMPLANT
NDL SUT 6 .5 CRC .975X.05 MAYO (NEEDLE) IMPLANT
NDL TAPERED W/ NITINOL LOOP (MISCELLANEOUS) ×1 IMPLANT
NEEDLE MAYO TAPER (NEEDLE) ×4
NEEDLE TAPERED W/ NITINOL LOOP (MISCELLANEOUS) ×2 IMPLANT
NS IRRIG 1000ML POUR BTL (IV SOLUTION) ×6 IMPLANT
PACK SHOULDER (CUSTOM PROCEDURE TRAY) ×2 IMPLANT
PAD ARMBOARD 7.5X6 YLW CONV (MISCELLANEOUS) ×4 IMPLANT
PAD COLD SHLDR WRAP-ON (PAD) ×2 IMPLANT
PASSER SUT SWANSON 36MM LOOP (INSTRUMENTS) ×2 IMPLANT
PIN HUMERAL STMN 3.2MMX9IN (INSTRUMENTS) ×1 IMPLANT
REAMER GUIDE BUSHING SURG DISP (MISCELLANEOUS) ×1 IMPLANT
REAMER GUIDE W/SCREW AUG (MISCELLANEOUS) ×1 IMPLANT
RESTRAINT HEAD UNIVERSAL NS (MISCELLANEOUS) ×2 IMPLANT
SCREW BONE STRL 6.5MMX30MM (Screw) ×1 IMPLANT
SCREW LOCKING 4.75MMX15MM (Screw) ×2 IMPLANT
SCREW LOCKING NS 4.75MMX20MM (Screw) ×1 IMPLANT
SCREW LOCKING STRL 4.75X25X3.5 (Screw) ×1 IMPLANT
SLING ARM IMMOBILIZER LRG (SOFTGOODS) ×2 IMPLANT
SOL PREP POV-IOD 4OZ 10% (MISCELLANEOUS) ×2 IMPLANT
SPONGE LAP 18X18 RF (DISPOSABLE) ×5 IMPLANT
SPONGE LAP 4X18 RFD (DISPOSABLE) ×2 IMPLANT
STEM HUMERAL STRL 12MMX122MM (Stem) ×1 IMPLANT
STRIP CLOSURE SKIN 1/2X4 (GAUZE/BANDAGES/DRESSINGS) ×2 IMPLANT
SUCTION FRAZIER HANDLE 10FR (MISCELLANEOUS) ×2
SUCTION TUBE FRAZIER 10FR DISP (MISCELLANEOUS) ×1 IMPLANT
SUT BROADBAND TAPE 2PK 1.5 (SUTURE) ×3 IMPLANT
SUT FIBERWIRE #2 38 T-5 BLUE (SUTURE)
SUT MAXBRAID (SUTURE) IMPLANT
SUT MNCRL AB 3-0 PS2 18 (SUTURE) ×2 IMPLANT
SUT SILK 2 0 TIES 10X30 (SUTURE) ×2 IMPLANT
SUT VIC AB 0 CT1 27 (SUTURE) ×8
SUT VIC AB 0 CT1 27XBRD ANBCTR (SUTURE) ×4 IMPLANT
SUT VIC AB 1 CT1 27 (SUTURE) ×4
SUT VIC AB 1 CT1 27XBRD ANBCTR (SUTURE) ×2 IMPLANT
SUT VIC AB 2-0 CT1 27 (SUTURE) ×6
SUT VIC AB 2-0 CT1 TAPERPNT 27 (SUTURE) ×3 IMPLANT
SUT VIC AB CT1 27XBRD ANBCTRL (SUTURE) ×4
SUT VICRYL 0 UR6 27IN ABS (SUTURE) ×6 IMPLANT
SUTURE FIBERWR #2 38 T-5 BLUE (SUTURE) IMPLANT
TOWEL GREEN STERILE (TOWEL DISPOSABLE) ×2 IMPLANT
TRAY FOL W/BAG SLVR 16FR STRL (SET/KITS/TRAYS/PACK) IMPLANT
TRAY FOLEY W/BAG SLVR 16FR LF (SET/KITS/TRAYS/PACK)
TRAY HUM REV SHOULDER 36 +3 (Shoulder) ×1 IMPLANT
TRAY HUM REV SHOULDER STD +6 (Shoulder) ×1 IMPLANT
WATER STERILE IRR 1000ML POUR (IV SOLUTION) ×2 IMPLANT
YANKAUER SUCT BULB TIP NO VENT (SUCTIONS) ×2 IMPLANT

## 2021-02-02 NOTE — Transfer of Care (Signed)
Immediate Anesthesia Transfer of Care Note  Patient: [name]  Procedure(s) Performed: LEFT REVERSE SHOULDER ARTHROPLASTY (Left Shoulder)  Patient Location: PACU  Anesthesia Type:General  Level of Consciousness: drowsy and patient cooperative  Airway & Oxygen Therapy: Patient Spontanous Breathing  Post-op Assessment: Report given to RN, Post -op Vital signs reviewed and stable and Patient moving all extremities X 4  Post vital signs: Reviewed and stable  Last Vitals:  Vitals Value Taken Time  BP    Temp    Pulse    Resp    SpO2      Last Pain:  Vitals:   02/02/21 0718  TempSrc:   PainSc: 3       Patients Stated Pain Goal: 3 (14/97/02 6378)  Complications: No complications documented.

## 2021-02-02 NOTE — Interval H&P Note (Signed)
History and Physical Interval Note:  02/02/2021 7:22 AM  [name]  has presented today for surgery, with the diagnosis of left shoulder  proximal humerus fracture nonunion.  The various methods of treatment have been discussed with the patient and family. After consideration of risks, benefits and other options for treatment, the patient has consented to  Procedure(s): LEFT REVERSE SHOULDER ARTHROPLASTY (Left) as a surgical intervention.  The patient's history has been reviewed, patient examined, no change in status, stable for surgery.  I have reviewed the patient's chart and labs.  Questions were answered to the patient's satisfaction.   Kaede has had a consult to have her Port-A-Cath removed and that will happen in approximately 1 month.  Anderson Malta

## 2021-02-02 NOTE — Anesthesia Procedure Notes (Signed)
Anesthesia Regional Block: Interscalene brachial plexus block   Pre-Anesthetic Checklist: ,, timeout performed, Correct Patient, Correct Site, Correct Laterality, Correct Procedure, Correct Position, site marked, Risks and benefits discussed,  Surgical consent,  Pre-op evaluation,  At surgeon's request and post-op pain management  Laterality: Left  Prep: chloraprep       Needles:  Injection technique: Single-shot  Needle Type: Stimulator Needle - 40     Needle Length: 4cm  Needle Gauge: 22     Additional Needles:   Procedures:,,,, ultrasound used (permanent image in chart),,,,  Narrative:  Start time: 02/02/2021 7:50 AM End time: 02/02/2021 7:55 AM Injection made incrementally with aspirations every 5 mL. Anesthesiologist: Nolon Nations, MD  Additional Notes: BP cuff, EKG monitors applied. Sedation begun. Nerve location verified with U/S. Anesthetic injected incrementally, slowly , and after neg aspirations under direct u/s guidance. Good perineural spread. Tolerated well.

## 2021-02-02 NOTE — Brief Op Note (Signed)
   02/02/2021  12:50 PM  PATIENT:  [name]  70 y.o. female  PRE-OPERATIVE DIAGNOSIS:  left shoulder  proximal humerus fracture nonunion  POST-OPERATIVE DIAGNOSIS:  left shoulder  proximal humerus fracture nonunion  PROCEDURE:  Procedure(s): LEFT REVERSE SHOULDER ARTHROPLASTY  SURGEON:  Surgeon(s): Meredith Pel, MD  ASSISTANT: magnant pa  ANESTHESIA:   general  EBL: 250 ml    Total I/O In: 1200 [I.V.:1000; IV Piggyback:200] Out: 300 [Blood:300]  BLOOD ADMINISTERED: none  DRAINS: none   LOCAL MEDICATIONS USED:  none  SPECIMEN:  No Specimen  COUNTS:  YES  TOURNIQUET:  * No tourniquets in log *  DICTATION: .Other Dictation: Dictation Number [id number]  PLAN OF CARE: Admit for overnight observation  PATIENT DISPOSITION:  PACU - hemodynamically stable

## 2021-02-02 NOTE — Anesthesia Procedure Notes (Signed)
Procedure Name: Intubation Date/Time: 02/02/2021 8:50 AM Performed by: Darletta Moll, CRNA Pre-anesthesia Checklist: Patient identified, Emergency Drugs available, Suction available and Patient being monitored Patient Re-evaluated:Patient Re-evaluated prior to induction Oxygen Delivery Method: Circle system utilized Preoxygenation: Pre-oxygenation with 100% oxygen Induction Type: IV induction Ventilation: Mask ventilation without difficulty Laryngoscope Size: Mac and 3 Grade View: Grade II Tube type: Oral Tube size: 7.0 mm Number of attempts: 1 Airway Equipment and Method: Stylet Placement Confirmation: ETT inserted through vocal cords under direct vision,  positive ETCO2 and breath sounds checked- equal and bilateral Secured at: 21 cm Tube secured with: Tape Dental Injury: Teeth and Oropharynx as per pre-operative assessment

## 2021-02-03 ENCOUNTER — Encounter (HOSPITAL_COMMUNITY): Payer: Self-pay | Admitting: Orthopedic Surgery

## 2021-02-03 DIAGNOSIS — S42292K Other displaced fracture of upper end of left humerus, subsequent encounter for fracture with nonunion: Secondary | ICD-10-CM | POA: Diagnosis not present

## 2021-02-03 DIAGNOSIS — I10 Essential (primary) hypertension: Secondary | ICD-10-CM | POA: Diagnosis not present

## 2021-02-03 DIAGNOSIS — Z87891 Personal history of nicotine dependence: Secondary | ICD-10-CM | POA: Diagnosis not present

## 2021-02-03 DIAGNOSIS — E039 Hypothyroidism, unspecified: Secondary | ICD-10-CM | POA: Diagnosis not present

## 2021-02-03 DIAGNOSIS — Z20822 Contact with and (suspected) exposure to covid-19: Secondary | ICD-10-CM | POA: Diagnosis not present

## 2021-02-03 DIAGNOSIS — Z853 Personal history of malignant neoplasm of breast: Secondary | ICD-10-CM | POA: Diagnosis not present

## 2021-02-03 DIAGNOSIS — Z7982 Long term (current) use of aspirin: Secondary | ICD-10-CM | POA: Diagnosis not present

## 2021-02-03 DIAGNOSIS — Z79899 Other long term (current) drug therapy: Secondary | ICD-10-CM | POA: Diagnosis not present

## 2021-02-03 LAB — CBC
HCT: 33.3 % — ABNORMAL LOW (ref 36.0–46.0)
Hemoglobin: 10.6 g/dL — ABNORMAL LOW (ref 12.0–15.0)
MCH: 32.6 pg (ref 26.0–34.0)
MCHC: 31.8 g/dL (ref 30.0–36.0)
MCV: 102.5 fL — ABNORMAL HIGH (ref 80.0–100.0)
Platelets: 231 10*3/uL (ref 150–400)
RBC: 3.25 MIL/uL — ABNORMAL LOW (ref 3.87–5.11)
RDW: 13.2 % (ref 11.5–15.5)
WBC: 10.8 10*3/uL — ABNORMAL HIGH (ref 4.0–10.5)
nRBC: 0 % (ref 0.0–0.2)

## 2021-02-03 MED ORDER — CHLORHEXIDINE GLUCONATE CLOTH 2 % EX PADS
6.0000 | MEDICATED_PAD | Freq: Every day | CUTANEOUS | Status: DC
  Administered 2021-02-03 – 2021-02-06 (×4): 6 via TOPICAL

## 2021-02-03 MED ORDER — SODIUM CHLORIDE 0.9% FLUSH
10.0000 mL | INTRAVENOUS | Status: DC | PRN
  Administered 2021-02-04: 10 mL

## 2021-02-03 MED ORDER — SODIUM CHLORIDE 0.9% FLUSH
10.0000 mL | Freq: Two times a day (BID) | INTRAVENOUS | Status: DC
  Administered 2021-02-04 – 2021-02-06 (×5): 10 mL

## 2021-02-03 NOTE — Evaluation (Signed)
Occupational Therapy Evaluation Patient Details Name: [name] MRN: [id number] DOB: [birth year] Today's Date: 02/03/2021    History of Present Illness 70 yo female with L shoulder proximal humerus fracture with nonunion, s/p L reverse shoulder arthroplasty on 2/14. PMH includes anxiety, breast cancer with lumpectomy and radioactive seed placement 2021 and recent port removal, chronic pain, concussion 2008, HTN, depression, spinal fusion 2012.   Clinical Impression   Pt ist at mod A level with ADLs/selfcare and min guard A level with mobility. Pt is NWB of L UE/shoulder with sling to be worn at all times. Pt will have 24/7 assist at home from her husband. Pt and he husband educated on sling wear, ADL techniques, postioning of  L UE, ROM of elbow/wrist/hand only and ADL mobility safety; pt and husband verblaize and demo understanding. All education is completed and no further acute OT is indicated at this time. OT will sign off    Follow Up Recommendations  Follow surgeon's recommendation for DC plan and follow-up therapies (progress rehab of [place] per MD when appropriate)    Equipment Recommendations  None recommended by OT    Recommendations for Other Services       Precautions / Restrictions Precautions Precautions: Fall;Shoulder Type of Shoulder Precautions: NO A/PROM of L shoulder Shoulder Interventions: Shoulder sling/immobilizer;At all times Precaution Booklet Issued: Yes (comment) Precaution Comments: shoulder protocol education provided with handout Required Braces or Orthoses: Sling Restrictions Weight Bearing Restrictions: Yes LUE Weight Bearing: Non weight bearing      Mobility Bed Mobility Overal bed mobility: Needs Assistance Bed Mobility: Supine to Sit     Supine to sit: Min guard;HOB elevated     General bed mobility comments: for safety, increased time and effort    Transfers Overall transfer level: Needs assistance Equipment used: 1 person hand  held assist Transfers: Sit to/from [facility] Sit to Stand: Min guard Stand pivot transfers: Min guard       General transfer comment: Min assist for stand and pivot to [facility] for steadying via HHA, STS x2 during session.    Balance Overall balance assessment: Needs assistance;History of Falls Sitting-balance support: No upper extremity supported;Feet supported Sitting balance-Leahy Scale: Good     Standing balance support: No upper extremity supported;During functional activity Standing balance-Leahy Scale: Fair Standing balance comment: able to ambulate without AD, unable to accept challenge and reaches for environment                           ADL either performed or assessed with clinical judgement   ADL Overall ADL's : Needs assistance/impaired Eating/Feeding: Set up;Sitting;With caregiver independent assisting   Grooming: Wash/dry hands;Wash/dry face;Set up;Sitting;With caregiver independent assisting   Upper Body Bathing: Moderate assistance;With caregiver independent assisting   Lower Body Bathing: Moderate assistance;With caregiver independent assisting   Upper Body Dressing : Moderate assistance;With caregiver independent assisting   Lower Body Dressing: Moderate assistance;With caregiver independent assisting   Toilet Transfer: Min guard;Ambulation;Stand-pivot;BSC   Toileting- Clothing Manipulation and Hygiene: Moderate assistance;With caregiver independent assisting;Sit to/from stand       Functional mobility during ADLs: Min guard General ADL Comments: pt and spouse educated on bathing and dressing technqiues, doffing and donning sling     Vision Baseline Vision/History: Wears glasses Wears Glasses: At all times Patient Visual Report: No change from baseline       Perception     Praxis      Pertinent Vitals/Pain Pain Assessment:  0-10 Pain Score: 7  Pain Descriptors / Indicators: Sore;Discomfort;Aching Pain  Intervention(s): Monitored during session;Repositioned;Patient requesting pain meds-RN notified;RN gave pain meds during session     Hand Dominance Right   Extremity/Trunk Assessment Upper Extremity Assessment Upper Extremity Assessment: Generalized weakness;LUE deficits/detail LUE Deficits / Details: sling at all times, able to move hand, wrist, and elbow but limited due to post-surgical pain LUE: Unable to fully assess due to pain;Unable to fully assess due to immobilization   Lower Extremity Assessment Lower Extremity Assessment: Defer to PT evaluation   Cervical / Trunk Assessment Cervical / Trunk Assessment: Normal   Communication Communication Communication: No difficulties   Cognition Arousal/Alertness: Awake/alert Behavior During Therapy: WFL for tasks assessed/performed Overall Cognitive Status: Within Functional Limits for tasks assessed                                 General Comments: anxious and limited command following when upset. Pt got very anxious during stair training and kept saying "I can't leave today, I just can't"   General Comments       Exercises     Shoulder Instructions Shoulder Instructions Donning/doffing shirt without moving shoulder: Moderate assistance;Caregiver independent with task Method for sponge bathing under operated UE: Moderate assistance;Caregiver independent with task Donning/doffing sling/immobilizer: Moderate assistance;Caregiver independent with task Correct positioning of sling/immobilizer: Supervision/safety;Caregiver independent with task ROM for elbow, wrist and digits of operated UE: Supervision/safety;Caregiver independent with task Sling wearing schedule (on at all times/off for ADL's): Supervision/safety;Caregiver independent with task Proper positioning of operated UE when showering: Supervision/safety;Caregiver independent with task Positioning of UE while sleeping: Supervision/safety;Caregiver independent  with task    Home Living Family/patient expects to be discharged to:: Private residence Living Arrangements: Spouse/significant other Available Help at Discharge: Family Type of Home: House Home Access: Stairs to enter Technical brewer of Steps: 4 Entrance Stairs-Rails: Left       Bathroom Shower/Tub: Occupational psychologist: Standard     Home Equipment: Environmental consultant - 2 wheels;Cane - single point;Bedside commode;Shower seat - built in;Grab bars - tub/shower          Prior Functioning/Environment Level of Independence: Independent with assistive device(s)        Comments: Ind with ADLs/selfcare, home mgt, was driving, using cane for ambulation in community        OT Problem List: Decreased strength;Pain;Decreased range of motion;Decreased activity tolerance;Decreased coordination;Impaired UE functional use      OT Treatment/Interventions:      OT Goals(Current goals can be found in the care plan section) Acute Rehab OT Goals Patient Stated Goal: back to baseline OT Goal Formulation: With patient/family  OT Frequency:     Barriers to D/C:            Co-evaluation              AM-PAC OT "6 Clicks" Daily Activity     Outcome Measure Help from another person eating meals?: A Little Help from another person taking care of personal grooming?: A Little Help from another person toileting, which includes using toliet, bedpan, or urinal?: A Lot Help from another person bathing (including washing, rinsing, drying)?: A Lot Help from another person to put on and taking off regular upper body clothing?: A Lot Help from another person to put on and taking off regular lower body clothing?: A Lot 6 Click Score: 14   End of Session Equipment Utilized During  Treatment: Gait belt  Activity Tolerance: Patient tolerated treatment well Patient left: in chair;with call bell/phone within reach;with family/visitor present  OT Visit Diagnosis: Pain;Unsteadiness on  feet (R26.81) Pain - Right/Left: Left Pain - part of body: Shoulder                Time: 6295-2841 OT Time Calculation (min): 41 min Charges:  OT General Charges $OT Visit: 1 Visit OT Evaluation $OT Eval Low Complexity: 1 Low OT Treatments $Self Care/Home Management : 8-22 mins $Therapeutic Activity: 8-22 mins    Britt Bottom 02/03/2021, 2:17 PM

## 2021-02-03 NOTE — Anesthesia Postprocedure Evaluation (Signed)
Anesthesia Post Note  Patient: [name]  Procedure(s) Performed: LEFT REVERSE SHOULDER ARTHROPLASTY (Left Shoulder)     Patient location during evaluation: PACU Anesthesia Type: General Level of consciousness: sedated and patient cooperative Pain management: pain level controlled Vital Signs Assessment: post-procedure vital signs reviewed and stable Respiratory status: spontaneous breathing Cardiovascular status: stable Anesthetic complications: no   No complications documented.  Last Vitals:  Vitals:   02/03/21 0400 02/03/21 0838  BP: 119/63 (!) 96/43  Pulse: 67 75  Resp: 17 17  Temp: 36.7 C 36.8 C  SpO2: 92% 92%    Last Pain:  Vitals:   02/03/21 0838  TempSrc: Oral  PainSc:                  Nolon Nations

## 2021-02-03 NOTE — Op Note (Signed)
NAME: [name], [facility]MP[facility] MEDICAL RECORD [id number] ACCOUNT [id number] DATE OF BIRTH:Mar 13, 1951 FACILITY: [facility] LOCATION: [facility] PHYSICIAN:Latrena Benegas Randel Pigg, MD  OPERATIVE REPORT  DATE OF PROCEDURE:  02/02/2021  PREOPERATIVE DIAGNOSIS:  Left shoulder proximal humerus fracture nonunion.  POSTOPERATIVE DIAGNOSIS:  Left shoulder proximal humerus fracture nonunion.  PROCEDURE:  Left shoulder proximal humerus fracture nonunion takedown with reverse shoulder replacement using components, small augmented baseplate Biomet, 1 compression screw, 4 fixed locking screws with 36 mm standard glenosphere and humeral fracture  stem size 12 x 122 with mini humeral tray +6 taper offset and +3 thickness bearing cross-linked polyethylene.  SURGEON:  Meredith Pel, MD  ASSISTANT:  Annie Main, PA.  INDICATIONS:  [name] is a 70 year old patient with left shoulder proximal humerus fracture nonunion who presents for operative management after explanation of risks, benefits.  PROCEDURE IN DETAIL:  The patient was brought to the operating room where general anesthetic was induced.  Preoperative antibiotics administered.  Timeout was called.  Left shoulder prescrubbed with hydrogen peroxide and alcohol, then Betadine, which was  allowed to air dry.  The patient did undergo 3 days of preoperative benzoyl peroxide scrubbing at home.  Ioban then used to cover the operative field.  The patient was prepped and draped and Ioban was used to cover the operative field.  Head was in  neutral position.  The patient had pretty reasonable passive range of motion due to the nonunion.  Next, deltopectoral timeout was called.  Deltopectoral approach was made.  Cephalic vein mobilized medially.  The biceps tendon was tenodesed to the pec  tendon using 4-0 Vicryl sutures.  Circumflex vessels were ligated.  Axillary nerve was visualized, palpated at all times during the case and protected at all times during the case.  Next, the  rotator interval was opened.  Subacromial subacromial and  subdeltoid adhesions were released manually.  Significant adhesions were present.  The rotator interval was opened up to the base of the coracoid.  The osteotomy was performed on the lesser tuberosity, which was essentially part of the non-united humeral  head fragment.  This was thinned out.  Next the similar maneuver was performed on the greater tuberosity fragment.  Debulking of the humeral head was performed.  Next, the canal was reamed and broached up to a size 12.  A cap was placed.  Attention then  directed towards the glenoid.  A circumferential capsular and labral release was performed.  Using patient specific instrumentation, the guide pin was placed into the coracoid and the middle of the glenoid.  Reaming was then performed in accordance with  preoperative templating.  Bone quality was exceedingly poor in the glenoid.  The augment reaming was performed.  A baseplate was placed with good contact achieved.  Central screw fixation did achieve very good purchase.  Four peripheral locking screws  were placed also in good position.  The baseplate was stable.  The glenosphere was then chosen and placed into position.  Next, the trial reduction was performed with the +0 and +3 liner.  The +3 liner gave good stability.  Further debulking of the  greater tuberosity fragment was performed.  A good reduction achieved.  Thorough irrigation was then performed, 2 suture tapes placed through the lesser tuberosity bone tendon junction.  These were fixed to the prosthesis after the reduction was  performed.  Good reduction was achieved and the tuberosities were tied through the prosthesis to each other.  The patient had about 30 degrees of external rotation, 90  degrees of abduction and good forward flexion.  Thorough irrigation again performed.   Bone grafting was then placed at the interface between the tuberosities and the shaft.  This was placed before  tying the pieces to each other.  Next IrriSept solution used after the incision and at all times during the case.  Vancomycin powder was then  placed within the rotator interval, which was then closed with the arm in external rotation using #1 Vicryl suture.  Thorough irrigation again performed.  Vancomycin powder placed below the deltopectoral interval, which was then closed using #1 Vicryl  suture.  Skin was closed using interrupted inverted 0 Vicryl suture, 2-0 Vicryl suture and a 3-0 Monocryl with Steri-Strips and Aquacel dressing applied.  The patient tolerated the procedure well without immediate complications.  He was transferred to  the recovery room in stable condition.    Luke's assistance was required at all times for opening and closing, limb positioning, mobilization of tissue.  His assistance was a medical necessity.  Axillary nerve was also palpated at the end of the case and Tug test was positive for good position  and stability of the nerve.  IN/NUANCE  D:02/02/2021 T:02/02/2021 JOB:[id number]

## 2021-02-03 NOTE — Progress Notes (Signed)
  Subjective: [name] is a 70 y.o. female s/p left reverse shoulder arthroplasty.  They are POD 1.  Pt's pain is moderate but controlled.  She does complain of some lightheadedness and unsteady walking.  She has 4 steps to enter her house..    Objective: Vital signs in last 24 hours: Temp:  [97.7 F (36.5 C)-98.3 F (36.8 C)] 98.3 F (36.8 C) (02/15 0838) Pulse Rate:  [67-98] 75 (02/15 0838) Resp:  [10-18] 17 (02/15 0838) BP: (96-158)/(43-78) 96/43 (02/15 0838) SpO2:  [92 %-100 %] 92 % (02/15 0838)  Intake/Output from previous day: 02/14 0701 - 02/15 0700 In: 2381.6 [P.O.:240; I.V.:1941.6; IV Piggyback:200] Out: 900 [Urine:600; Blood:300] Intake/Output this shift: No intake/output data recorded.  Exam:  No gross blood or drainage overlying the dressing 2+ radial pulse Sensation intact distally in the left hand Able to extend the left wrist.  EPL function intact.  Finger abduction intact.   Labs: Recent Labs    02/03/21 0243  HGB 10.6*   Recent Labs    02/03/21 0243  WBC 10.8*  RBC 3.25*  HCT 33.3*  PLT 231   No results for input(s): NA, K, CL, CO2, BUN, CREATININE, GLUCOSE, CALCIUM in the last 72 hours. No results for input(s): LABPT, INR in the last 72 hours.  Assessment/Plan: Pt is POD 1 s/p left reverse shoulder arthroplasty for humeral head fracture nonunion  -Plan to discharge to home likely tomorrow pending patient's pain and clearance by physical therapy  -No lifting with the operative arm.  No passive or active range of motion of the extremity.  Stay in sling for the next 3 weeks.     Kingslee Mairena L Carrisa Keller 02/03/2021, 10:51 AM

## 2021-02-03 NOTE — Plan of Care (Signed)

## 2021-02-03 NOTE — Evaluation (Signed)
Physical Therapy Evaluation Patient Details Name: [name] MRN: [id number] DOB: [birth year] Today's Date: 02/03/2021   History of Present Illness  70 yo female with L shoulder proximal humerus fracture with nonunion, s/p L reverse shoulder arthroplasty on 2/14. PMH includes anxiety, breast cancer with lumpectomy and radioactive seed placement 2021 and recent port removal, chronic pain, concussion 2008, HTN, depression, spinal fusion 2012.  Clinical Impression   Pt presents with generalized weakness, impaired standing balance with history of falls, impaired gait speed and step length, difficulty navigating steps, and decreased activity tolerance vs baseline. Pt to benefit from acute PT to address deficits. Pt ambulated hallway distance with no AD, PT recommending SPC for steadying but pt declines and uses environment to self-steady as needed. Pt became very anxious after step navigation due to unsteadiness and fear of falling, states "I can't go home today". RN informed, PT recommending HHPT to address post-operative weakness and balance impairment. PT to progress mobility as tolerated, and will continue to follow acutely.      Follow Up Recommendations Home health PT;Supervision for mobility/OOB    Equipment Recommendations  None recommended by PT    Recommendations for Other Services       Precautions / Restrictions Precautions Precautions: Fall;Shoulder Shoulder Interventions: Shoulder sling/immobilizer;At all times Precaution Booklet Issued: No Precaution Comments: OT to see and review shoulder precautions - PT discussed no shoulder AROM/PROM, sling at all times, proper sling fit Required Braces or Orthoses: Sling Restrictions Weight Bearing Restrictions: Yes LUE Weight Bearing: Non weight bearing      Mobility  Bed Mobility Overal bed mobility: Needs Assistance Bed Mobility: Supine to Sit     Supine to sit: Min guard;HOB elevated     General bed mobility comments:  for safety, increased time and effort    Transfers Overall transfer level: Needs assistance Equipment used: 1 person hand held assist Transfers: Sit to/from [facility] Sit to Stand: Min assist Stand pivot transfers: Min assist       General transfer comment: Min assist for stand and pivot to [facility] for steadying via HHA, STS x2 during session.  Ambulation/Gait Ambulation/Gait assistance: Min guard Gait Distance (Feet): 175 Feet Assistive device: None Gait Pattern/deviations: Step-through pattern;Decreased stride length;Trunk flexed Gait velocity: decr   General Gait Details: Min guard for safety, pt occasionally reaching for environment to self-steady. PT asked pt multiple times if she wanted to use SPC, pt declined  Stairs Stairs: Yes Stairs assistance: Min assist Stair Management: One rail Left;Sideways;Forwards Number of Stairs: 3 (x2) General stair comments: min assist to steady, cuing for sequencing and ascending steps sideways to make use of L handrail at home. PT attempted assisting pt with RUE HHA, but pt very uneasy and anxious about falling with this method.  Wheelchair Mobility    Modified Rankin (Stroke Patients Only)       Balance Overall balance assessment: Needs assistance;History of Falls Sitting-balance support: No upper extremity supported;Feet supported Sitting balance-Leahy Scale: Good     Standing balance support: No upper extremity supported;During functional activity Standing balance-Leahy Scale: Fair Standing balance comment: able to ambulate without AD, unable to accept challenge and reaches for environment                             Pertinent Vitals/Pain Pain Assessment: 0-10 Pain Score: 4  Pain Location: L shoulder Pain Descriptors / Indicators: Sore;Discomfort Pain Intervention(s): Limited activity within patient's tolerance;Monitored during session;Repositioned  Home Living Family/patient expects to be  discharged to:: Private residence Living Arrangements: Spouse/significant other Available Help at Discharge: Family Type of Home: House Home Access: Stairs to enter Entrance Stairs-Rails: Left Entrance Stairs-Number of Steps: 4 Home Layout: One level Home Equipment: [place] - 2 wheels;Cane - single point;Bedside commode;Shower seat - built in;Grab bars - tub/shower      Prior Function Level of Independence: Independent with assistive device(s)         Comments: using cane for ambulation in community, uneven terrain     Hand Dominance   Dominant Hand: Right    Extremity/Trunk Assessment   Upper Extremity Assessment Upper Extremity Assessment: Defer to OT evaluation;LUE deficits/detail LUE Deficits / Details: sling at all times, able to move hand, wrist, and elbow but limited due to post-surgical pain LUE: Unable to fully assess due to pain;Unable to fully assess due to immobilization    Lower Extremity Assessment Lower Extremity Assessment: Generalized weakness    Cervical / Trunk Assessment Cervical / Trunk Assessment: Normal  Communication   Communication: No difficulties  Cognition Arousal/Alertness: Awake/alert Behavior During Therapy: Anxious Overall Cognitive Status: Within Functional Limits for tasks assessed                                 General Comments: anxious and limited command following when upset. Pt got very anxious during stair training and kept saying "I can't leave today, I just can't"      General Comments      Exercises     Assessment/Plan    PT Assessment Patient needs continued PT services  PT Problem List Decreased strength;Decreased mobility;Decreased safety awareness;Decreased knowledge of precautions;Decreased activity tolerance;Decreased balance;Decreased knowledge of use of DME;Pain       PT Treatment Interventions DME instruction;Therapeutic activities;Gait training;Therapeutic exercise;Patient/family  education;Balance training;Stair training;Functional mobility training;Neuromuscular re-education    PT Goals (Current goals can be found in the Care Plan section)  Acute Rehab PT Goals Patient Stated Goal: mobility back to baseline PT Goal Formulation: With patient Time For Goal Achievement: 02/17/21 Potential to Achieve Goals: Good    Frequency Min 5X/week   Barriers to discharge        Co-evaluation               AM-PAC PT "6 Clicks" Mobility  Outcome Measure Help needed turning from your back to your side while in a flat bed without using bedrails?: A Little Help needed moving from lying on your back to sitting on the side of a flat bed without using bedrails?: A Little Help needed moving to and from a bed to a chair (including a wheelchair)?: A Little Help needed standing up from a chair using your arms (e.g., wheelchair or bedside chair)?: A Little Help needed to walk in hospital room?: A Little Help needed climbing 3-5 steps with a railing? : A Little 6 Click Score: 18    End of Session Equipment Utilized During Treatment: Other (comment) (LUE sling) Activity Tolerance: Patient limited by pain;Other (comment);Patient tolerated treatment well (anxiety) Patient left: in chair;with call bell/phone within reach;Other (comment) (chair alarm pad placed, no posey alarm box in room, RN notified) Nurse Communication: Mobility status PT Visit Diagnosis: Unsteadiness on feet (R26.81);Pain;Difficulty in walking, not elsewhere classified (R26.2) Pain - Right/Left: Right Pain - part of body: Shoulder    Time: 4373-5789 PT Time Calculation (min) (ACUTE ONLY): 30 min   Charges:   PT  Evaluation $PT Eval Low Complexity: 1 Low PT Treatments $Gait Training: 8-22 mins        Stacie Glaze, PT Acute Rehabilitation Services Pager [phone]  Office [phone]   Louis Matte 02/03/2021, 11:23 AM

## 2021-02-03 NOTE — Plan of Care (Signed)
  Problem: Safety: Goal: Ability to remain free from injury will improve Outcome: Progressing   Problem: Pain Managment: Goal: General experience of comfort will improve Outcome: Progressing   

## 2021-02-04 DIAGNOSIS — I10 Essential (primary) hypertension: Secondary | ICD-10-CM | POA: Diagnosis not present

## 2021-02-04 DIAGNOSIS — Z87891 Personal history of nicotine dependence: Secondary | ICD-10-CM | POA: Diagnosis not present

## 2021-02-04 DIAGNOSIS — S42292K Other displaced fracture of upper end of left humerus, subsequent encounter for fracture with nonunion: Secondary | ICD-10-CM | POA: Diagnosis not present

## 2021-02-04 DIAGNOSIS — Z7982 Long term (current) use of aspirin: Secondary | ICD-10-CM | POA: Diagnosis not present

## 2021-02-04 DIAGNOSIS — Z79899 Other long term (current) drug therapy: Secondary | ICD-10-CM | POA: Diagnosis not present

## 2021-02-04 DIAGNOSIS — Z20822 Contact with and (suspected) exposure to covid-19: Secondary | ICD-10-CM | POA: Diagnosis not present

## 2021-02-04 DIAGNOSIS — Z853 Personal history of malignant neoplasm of breast: Secondary | ICD-10-CM | POA: Diagnosis not present

## 2021-02-04 DIAGNOSIS — E039 Hypothyroidism, unspecified: Secondary | ICD-10-CM | POA: Diagnosis not present

## 2021-02-04 LAB — CBC
HCT: 31 % — ABNORMAL LOW (ref 36.0–46.0)
Hemoglobin: 10 g/dL — ABNORMAL LOW (ref 12.0–15.0)
MCH: 34.1 pg — ABNORMAL HIGH (ref 26.0–34.0)
MCHC: 32.3 g/dL (ref 30.0–36.0)
MCV: 105.8 fL — ABNORMAL HIGH (ref 80.0–100.0)
Platelets: 219 10*3/uL (ref 150–400)
RBC: 2.93 MIL/uL — ABNORMAL LOW (ref 3.87–5.11)
RDW: 13.7 % (ref 11.5–15.5)
WBC: 9.4 10*3/uL (ref 4.0–10.5)
nRBC: 0 % (ref 0.0–0.2)

## 2021-02-04 NOTE — Progress Notes (Signed)
  Subjective: Patient stable.  Having some pain in the left shoulder.  Does not feel quite steady enough on her feet to go home   Objective: Vital signs in last 24 hours: Temp:  [98.3 F (36.8 C)-98.5 F (36.9 C)] 98.5 F (36.9 C) (02/16 0545) Pulse Rate:  [72-97] 97 (02/16 0545) Resp:  [10-17] 16 (02/16 0545) BP: (96-128)/(43-72) 117/66 (02/16 0545) SpO2:  [92 %-97 %] 97 % (02/16 0545)  Intake/Output from previous day: No intake/output data recorded. Intake/Output this shift: No intake/output data recorded.  Exam:  No cellulitis present Compartment soft  Labs: Recent Labs    02/03/21 0243 02/04/21 0511  HGB 10.6* 10.0*   Recent Labs    02/03/21 0243 02/04/21 0511  WBC 10.8* 9.4  RBC 3.25* 2.93*  HCT 33.3* 31.0*  PLT 231 219   No results for input(s): NA, K, CL, CO2, BUN, CREATININE, GLUCOSE, CALCIUM in the last 72 hours. No results for input(s): LABPT, INR in the last 72 hours.  Assessment/Plan: Plan at this time is to see how she does with physical therapy.  We will likely need an assessment from physical therapy about suitability to discharge to home versus discharging to skilled nursing.   Landry Dyke Dean 02/04/2021, 8:01 AM

## 2021-02-04 NOTE — Progress Notes (Signed)
Pt sleeping deeply snoartous breathing hard to arouse- found O2 sat at 83 on RA- started oxygen at 2l [place]-sat increased to 97%- also able to awaken and alert

## 2021-02-04 NOTE — Progress Notes (Signed)
Physical Therapy Treatment Patient Details Name: [name] MRN: [id number] DOB: December 25, 1950 Today's Date: 02/04/2021    History of Present Illness 70 yo female with L shoulder proximal humerus fracture with nonunion, s/p L reverse shoulder arthroplasty on 2/14. PMH includes anxiety, breast cancer with lumpectomy and radioactive seed placement 2021 and recent port removal, chronic pain, concussion 2008, HTN, depression, spinal fusion 2012.    PT Comments    Patient continues to be unsteady with OOB mobility requiring up to minA for balance. Patient on 2L O2 [place] on arrival with spO2 100%. Removed for ambulation, spO2 dropped to 88% however increased to 91-92% with PLB technique. Patient reports not being ready/safe to go home. Discharge plan updated to SNF as patient is unsteady and requires assistance for OOB mobility and per patient, husband is not easily available for assistance at home.    Follow Up Recommendations  SNF;Supervision for mobility/OOB     Equipment Recommendations  Cane    Recommendations for Other Services       Precautions / Restrictions Precautions Precautions: Fall;Shoulder Type of Shoulder Precautions: NO A/PROM of L shoulder Shoulder Interventions: Shoulder sling/immobilizer;At all times Precaution Booklet Issued: No Precaution Comments: OT provided protocol education with handout Required Braces or Orthoses: Sling Restrictions Weight Bearing Restrictions: Yes LUE Weight Bearing: Non weight bearing    Mobility  Bed Mobility Overal bed mobility: Needs Assistance Bed Mobility: Supine to Sit;Sit to Supine     Supine to sit: Supervision;HOB elevated Sit to supine: Supervision;HOB elevated   General bed mobility comments: for safety, increased time and effort    Transfers Overall transfer level: Needs assistance Equipment used: None Transfers: Sit to/from Stand Sit to Stand: Min assist         General transfer comment: minA for power up to  standing  Ambulation/Gait Ambulation/Gait assistance: Min guard Gait Distance (Feet): 100 Feet Assistive device: Straight cane Gait Pattern/deviations: Step-through pattern;Decreased stride length;Trunk flexed;Wide base of support Gait velocity: decreased   General Gait Details: min guard for safety. Instructed on use of SPC. LOB x1 with minA to steady. spO2 dropped to 88% on RA, increased to 91-92% with pursed lip breathing   Stairs         General stair comments: deferred due to increased unsteadiness and patient requesting to return to room due to fatigue   Wheelchair Mobility    Modified Rankin (Stroke Patients Only)       Balance Overall balance assessment: Needs assistance;History of Falls Sitting-balance support: No upper extremity supported;Feet supported Sitting balance-Leahy Scale: Good     Standing balance support: Single extremity supported;During functional activity Standing balance-Leahy Scale: Poor Standing balance comment: amb with SPC and required minguard-minA for balance                            Cognition Arousal/Alertness: Awake/alert Behavior During Therapy: WFL for tasks assessed/performed Overall Cognitive Status: Within Functional Limits for tasks assessed                                 General Comments: anxious with mobility after MD notified her that she would possibly need rehab prior to returning home      Exercises      General Comments General comments (skin integrity, edema, etc.): Upon arrival, patient on 2L O2 [place] with spO2 100%. Removed for ambulation, spO2 dropped to 88% on RA,  able to increase to 91-92% with pursed lip breathing. Donned 1L O2 [place] prior to departure      Pertinent Vitals/Pain Pain Assessment: 0-10 Pain Score: 4  Pain Location: L shoulder Pain Descriptors / Indicators: Sore;Discomfort;Aching Pain Intervention(s): Monitored during session;Repositioned    Home Living                       Prior Function            PT Goals (current goals can now be found in the care plan section) Acute Rehab PT Goals Patient Stated Goal: back to baseline PT Goal Formulation: With patient Time For Goal Achievement: 02/17/21 Potential to Achieve Goals: Good Progress towards PT goals: Progressing toward goals    Frequency    Min 4X/week      PT Plan Discharge plan needs to be updated    Co-evaluation              AM-PAC PT "6 Clicks" Mobility   Outcome Measure  Help needed turning from your back to your side while in a flat bed without using bedrails?: A Little Help needed moving from lying on your back to sitting on the side of a flat bed without using bedrails?: A Little Help needed moving to and from a bed to a chair (including a wheelchair)?: A Little Help needed standing up from a chair using your arms (e.g., wheelchair or bedside chair)?: A Little Help needed to walk in hospital room?: A Little Help needed climbing 3-5 steps with a railing? : A Little 6 Click Score: 18    End of Session Equipment Utilized During Treatment: Gait belt;Other (comment) (L UE sling) Activity Tolerance: Patient limited by fatigue Patient left: in bed;with call bell/phone within reach Nurse Communication: Mobility status PT Visit Diagnosis: Unsteadiness on feet (R26.81);Pain;Difficulty in walking, not elsewhere classified (R26.2) Pain - Right/Left: Left Pain - part of body: Shoulder     Time: 4383-8184 PT Time Calculation (min) (ACUTE ONLY): 30 min  Charges:  $Therapeutic Activity: 23-37 mins                     Margaret Cockerill A. Gilford Rile PT, DPT Acute Rehabilitation Services Pager [phone] Office [phone]    Linna Hoff 02/04/2021, 9:28 AM

## 2021-02-04 NOTE — Plan of Care (Signed)
  Problem: Activity: Goal: Risk for activity intolerance will decrease Outcome: Progressing   Problem: Pain Managment: Goal: General experience of comfort will improve Outcome: Progressing   Problem: Safety: Goal: Ability to remain free from injury will improve Outcome: Progressing   

## 2021-02-05 ENCOUNTER — Encounter (HOSPITAL_COMMUNITY): Payer: Self-pay | Admitting: Orthopedic Surgery

## 2021-02-05 DIAGNOSIS — Z79899 Other long term (current) drug therapy: Secondary | ICD-10-CM | POA: Diagnosis not present

## 2021-02-05 DIAGNOSIS — I10 Essential (primary) hypertension: Secondary | ICD-10-CM | POA: Diagnosis not present

## 2021-02-05 DIAGNOSIS — S42292K Other displaced fracture of upper end of left humerus, subsequent encounter for fracture with nonunion: Secondary | ICD-10-CM | POA: Diagnosis not present

## 2021-02-05 DIAGNOSIS — Z853 Personal history of malignant neoplasm of breast: Secondary | ICD-10-CM | POA: Diagnosis not present

## 2021-02-05 DIAGNOSIS — E039 Hypothyroidism, unspecified: Secondary | ICD-10-CM | POA: Diagnosis not present

## 2021-02-05 DIAGNOSIS — Z87891 Personal history of nicotine dependence: Secondary | ICD-10-CM | POA: Diagnosis not present

## 2021-02-05 DIAGNOSIS — Z20822 Contact with and (suspected) exposure to covid-19: Secondary | ICD-10-CM | POA: Diagnosis not present

## 2021-02-05 DIAGNOSIS — Z7982 Long term (current) use of aspirin: Secondary | ICD-10-CM | POA: Diagnosis not present

## 2021-02-05 NOTE — Plan of Care (Signed)

## 2021-02-05 NOTE — TOC Initial Note (Signed)
Transition of Care [facility]) - Initial/Assessment Note    Patient Details  Name: [name] MRN: [id number] Date of Birth: Jul 23, 1951  [facility] CM/SW Contact:    Amador Cunas, [place] Phone Number: 02/05/2021, 12:07 PM  Clinical Narrative:  SW spoke to pt and pt's spouse, Doren Custard, re PT recommendation for SNF. Pt with no previous SNF stay. Explained SNF placement process and answered questions. Pt/spouse indicate no preferred facility but would like to stay in [place]. Will begin SNF search and f/u with offers once available. Pt confirms she has received 3 COVID-19 vaccines.   Wandra Feinstein, MSW, LCSW [phone] (coverage)                    Expected Discharge Plan: [place] Barriers to Discharge: No SNF bed   Patient Goals and CMS Choice Patient states their goals for this hospitalization and ongoing recovery are:: I want my shoulder to heal CMS Medicare.gov Compare Post Acute Care list provided to:: Patient Choice offered to / list presented to : [facility]  Expected Discharge Plan and Services Expected Discharge Plan: [place]   Discharge Planning Services: CM Consult   Living arrangements for the past 2 months: Single Family Home                                      Prior Living Arrangements/Services Living arrangements for the past 2 months: Single Family Home Lives with:: Spouse Patient language and need for interpreter reviewed:: No Do you feel safe going back to the place where you live?: Yes      Need for Family Participation in Patient Care: Yes (Comment) Care giver support system in place?: Yes (comment)   Criminal Activity/Legal Involvement Pertinent to Current Situation/Hospitalization: No - Comment as needed  Activities of Daily Living Home Assistive Devices/Equipment: Cane (specify quad or straight),Eyeglasses ADL Screening (condition at time of admission) Patient's cognitive  ability adequate to safely complete daily activities?: Yes Is the patient deaf or have difficulty hearing?: No Does the patient have difficulty seeing, even when wearing glasses/contacts?: No Does the patient have difficulty concentrating, remembering, or making decisions?: No Patient able to express need for assistance with ADLs?: Yes Does the patient have difficulty dressing or bathing?: No Independently performs ADLs?: Yes (appropriate for developmental age) Does the patient have difficulty walking or climbing stairs?: Yes Weakness of Legs: Both Weakness of Arms/Hands: Left  Permission Sought/Granted Permission sought to share information with : Facility Art therapist granted to share information with : Yes, Verbal Permission Granted              Emotional Assessment Appearance:: Appears stated age Attitude/Demeanor/Rapport: Engaged Affect (typically observed): Accepting Orientation: : Oriented to Self,Oriented to Place,Oriented to  Time,Oriented to Situation Alcohol / Substance Use: Not Applicable    Admission diagnosis:  S/P reverse total shoulder arthroplasty, left [E70.350] Patient Active Problem List   Diagnosis Date Noted  . S/P reverse total shoulder arthroplasty, 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  . Vitamin D deficiency 01/15/2021  . Vitamin B12 deficiency 01/15/2021  . Primary insomnia 01/15/2021  . Morbid obesity ([place]) 01/15/2021  . Moderate recurrent major depression ([place]) 01/15/2021  . Benzodiazepine dependence ([place]) 01/15/2021  . Port-A-Cath in place 07/22/2020  . Left leg cellulitis 04/17/2020  . Cellulitis of left leg 04/17/2020  . Hypokalemia 04/17/2020  . Macrocytic anemia 04/17/2020  . Anxiety 04/17/2020  .  Depression 04/17/2020  . Chronic diarrhea 04/17/2020  . Chemotherapy induced diarrhea 04/17/2020  . Malignant neoplasm of upper-inner quadrant of right breast in female, estrogen receptor positive ([place]) 12/26/2019  . Encephalopathy 02/02/2018   . Hypothyroidism 02/02/2018  . AKI (acute kidney injury) ([place]) 02/02/2018  . Chronic back pain 02/02/2018  . Alcohol abuse 02/02/2018  . Weight gain 02/02/2018  . Benign essential HTN 02/02/2018   PCP:  Maurice Small, MD Pharmacy:   [facility], [place] [address] Phone: [phone] Fax: [phone]     Social Determinants of Health (SDOH) Interventions    Readmission Risk Interventions No flowsheet data found.

## 2021-02-05 NOTE — NC FL2 (Signed)
[place] LEVEL OF CARE SCREENING TOOL     IDENTIFICATION  Patient Name: [name] Birthdate: 1951-01-01 Sex: female Admission Date (Current Location): 02/02/2021  [facility] and [place] Number:  Herbalist and Address:  [facility], [place] [address] [place], [place] [address]      Provider Number: [id number]  Attending Physician Name and Address:  Meredith Pel, MD  Relative Name and Phone Number:       Current Level of Care: Hospital Recommended Level of Care: [place] Prior Approval Number:    Date Approved/Denied:   PASRR Number:    Discharge Plan: SNF    Current Diagnoses: Patient Active Problem List   Diagnosis Date Noted  . S/P reverse total shoulder arthroplasty, 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  . Vitamin D deficiency 01/15/2021  . Vitamin B12 deficiency 01/15/2021  . Primary insomnia 01/15/2021  . Morbid obesity ([place]) 01/15/2021  . Moderate recurrent major depression ([place]) 01/15/2021  . Benzodiazepine dependence ([place]) 01/15/2021  . Port-A-Cath in place 07/22/2020  . Left leg cellulitis 04/17/2020  . Cellulitis of left leg 04/17/2020  . Hypokalemia 04/17/2020  . Macrocytic anemia 04/17/2020  . Anxiety 04/17/2020  . Depression 04/17/2020  . Chronic diarrhea 04/17/2020  . Chemotherapy induced diarrhea 04/17/2020  . Malignant neoplasm of upper-inner quadrant of right breast in female, estrogen receptor positive ([place]) 12/26/2019  . Encephalopathy 02/02/2018  . Hypothyroidism 02/02/2018  . AKI (acute kidney injury) ([place]) 02/02/2018  . Chronic back pain 02/02/2018  . Alcohol abuse 02/02/2018  . Weight gain 02/02/2018  . Benign essential HTN 02/02/2018    Orientation RESPIRATION BLADDER Height & Weight     Self,Time,Situation,Place  Normal Continent Weight: 232 lb 5.8 oz (105.4 kg) Height:  5\' 7"  (170.2 cm)  BEHAVIORAL SYMPTOMS/MOOD NEUROLOGICAL BOWEL NUTRITION STATUS      Continent     AMBULATORY STATUS COMMUNICATION OF NEEDS Skin   Limited Assist Verbally Surgical wounds                       Personal Care Assistance Level of Assistance  Feeding,Dressing,Bathing Bathing Assistance: Limited assistance Feeding assistance: Limited assistance Dressing Assistance: Limited assistance     Functional Limitations Info  Sight,Hearing,Speech Sight Info: Impaired Hearing Info: Adequate Speech Info: Adequate    SPECIAL CARE FACTORS FREQUENCY  PT (By licensed PT),OT (By licensed OT)                    Contractures Contractures Info: Not present    Additional Factors Info                  Current Medications (02/05/2021):  This is the current hospital active medication list Current Facility-Administered Medications  Medication Dose Route Frequency Provider Last Rate Last Admin  . amLODipine (NORVASC) tablet 7.5 mg  7.5 mg Oral Q1500 Magnant, Charles L, PA-C   7.5 mg at 02/04/21 1425  . aspirin EC tablet 81 mg  81 mg Oral Daily Magnant, Charles L, PA-C   81 mg at 02/05/21 2202  . celecoxib (CELEBREX) capsule 200 mg  200 mg Oral BID Magnant, Charles L, PA-C   200 mg at 02/05/21 0953  . Chlorhexidine Gluconate Cloth 2 % PADS 6 each  6 each Topical Daily Shelly Coss, MD   6 each at 02/05/21 0954  . docusate sodium (COLACE) capsule 100 mg  100 mg Oral BID Magnant, Charles L, PA-C   100 mg  at 02/03/21 2129  . furosemide (LASIX) tablet 20 mg  20 mg Oral Daily PRN Magnant, Charles L, PA-C      . gabapentin (NEURONTIN) capsule 300 mg  300 mg Oral TID Magnant, Charles L, PA-C   300 mg at 02/05/21 0953  . HYDROmorphone (DILAUDID) injection 0.5 mg  0.5 mg Intravenous Q4H PRN Magnant, Charles L, PA-C   0.5 mg at 02/05/21 0608  . lactated ringers infusion   Intravenous Continuous Donella Stade, PA-C 75 mL/hr at 02/02/21 1630 New Bag at 02/02/21 1630  . levothyroxine (SYNTHROID) tablet 150 mcg  150 mcg Oral QAC breakfast Magnant, Charles L, PA-C   150 mcg at  02/05/21 2119  . loperamide (IMODIUM) capsule 2-4 mg  2-4 mg Oral QID PRN Magnant, Charles L, PA-C   4 mg at 02/02/21 2240  . menthol-cetylpyridinium (CEPACOL) lozenge 3 mg  1 lozenge Oral PRN Magnant, Charles L, PA-C       Or  . phenol (CHLORASEPTIC) mouth spray 1 spray  1 spray Mouth/Throat PRN Magnant, Charles L, PA-C      . methocarbamol (ROBAXIN) tablet 500 mg  500 mg Oral Q6H PRN Magnant, Charles L, PA-C   500 mg at 02/04/21 1836   Or  . methocarbamol (ROBAXIN) 500 mg in dextrose 5 % 50 mL IVPB  500 mg Intravenous Q6H PRN Magnant, Charles L, PA-C      . metoCLOPramide (REGLAN) tablet 5-10 mg  5-10 mg Oral Q8H PRN Magnant, Charles L, PA-C       Or  . metoCLOPramide (REGLAN) injection 5-10 mg  5-10 mg Intravenous Q8H PRN Magnant, Charles L, PA-C      . metoprolol tartrate (LOPRESSOR) tablet 25 mg  25 mg Oral BID Magnant, Charles L, PA-C   25 mg at 02/05/21 0953  . ondansetron (ZOFRAN) tablet 4 mg  4 mg Oral Q6H PRN Magnant, Charles L, PA-C       Or  . ondansetron (ZOFRAN) injection 4 mg  4 mg Intravenous Q6H PRN Magnant, Charles L, PA-C      . oxyCODONE (Oxy IR/ROXICODONE) immediate release tablet 5-10 mg  5-10 mg Oral Q4H PRN Magnant, Charles L, PA-C   10 mg at 02/05/21 4174  . sodium chloride flush (NS) 0.9 % injection 10-40 mL  10-40 mL Intracatheter Q12H Meredith Pel, MD   10 mL at 02/05/21 0954  . sodium chloride flush (NS) 0.9 % injection 10-40 mL  10-40 mL Intracatheter PRN Meredith Pel, MD   10 mL at 02/04/21 1640  . venlafaxine XR (EFFEXOR-XR) 24 hr capsule 150 mg  150 mg Oral Q1500 Magnant, Charles L, PA-C   150 mg at 02/04/21 1426     Discharge Medications: Please see discharge summary for a list of discharge medications.  Relevant Imaging Results:  Relevant Lab Results:   Additional Information SS# [id number]  Amador Cunas, [place]

## 2021-02-05 NOTE — Plan of Care (Signed)
Patient is s/p left reverse shoulder arthroplasty on 2/14. Dsg to left shoulder clean dry and intact with a small spot of drainage noted. Ice machine refilled with fresh ice and water and placed on patient's left shoulder for comfort. Pain controlled with prn meds as ordered. Discharge plan is to SNF per recommendation by PT. Will continue to monitor and continue current POC.

## 2021-02-05 NOTE — Progress Notes (Signed)
  Subjective: [name] is a 70 y.o. female s/p left RSA.  They are POD 3.  Pt's pain is controlled with pain medication but still moderate and severe at times.  She notes continued difficulty walking and feels very unsteady on her feet.  She is concerned about going home as her husband is almost 31 years old and she feels he is not able to adequately care for her in her current state..    Objective: Vital signs in last 24 hours: Temp:  [97.9 F (36.6 C)-99.3 F (37.4 C)] 98.6 F (37 C) (02/17 0443) Pulse Rate:  [80-92] 92 (02/17 0443) Resp:  [18-20] 18 (02/17 0443) BP: (110-132)/(71-99) 132/88 (02/17 0443) SpO2:  [96 %-100 %] 99 % (02/17 0443)  Intake/Output from previous day: 02/16 0701 - 02/17 0700 In: 240 [P.O.:240] Out: -  Intake/Output this shift: No intake/output data recorded.  Exam:  No gross blood or drainage overlying the dressing 1+ radial pulse Sensation intact distally in the left hand Able to extend the left wrist.  EPL and finger abduction intact.   Labs: Recent Labs    02/03/21 0243 02/04/21 0511  HGB 10.6* 10.0*   Recent Labs    02/03/21 0243 02/04/21 0511  WBC 10.8* 9.4  RBC 3.25* 2.93*  HCT 33.3* 31.0*  PLT 231 219   No results for input(s): NA, K, CL, CO2, BUN, CREATININE, GLUCOSE, CALCIUM in the last 72 hours. No results for input(s): LABPT, INR in the last 72 hours.  Assessment/Plan: Pt is POD 3 s/p left RSA    -Plan to discharge to SNF in coming days  -No lifting with the operative arm  -No passive or active range of motion of the left shoulder at this time.  -Follow-up with Dr. Marlou Sa at 2 weeks postop.     Casimira Sutphin L Marthella Osorno 02/05/2021, 8:12 AM

## 2021-02-06 DIAGNOSIS — Z7982 Long term (current) use of aspirin: Secondary | ICD-10-CM | POA: Diagnosis not present

## 2021-02-06 DIAGNOSIS — Z20822 Contact with and (suspected) exposure to covid-19: Secondary | ICD-10-CM | POA: Diagnosis not present

## 2021-02-06 DIAGNOSIS — Z87891 Personal history of nicotine dependence: Secondary | ICD-10-CM | POA: Diagnosis not present

## 2021-02-06 DIAGNOSIS — Z7401 Bed confinement status: Secondary | ICD-10-CM | POA: Diagnosis not present

## 2021-02-06 DIAGNOSIS — E039 Hypothyroidism, unspecified: Secondary | ICD-10-CM | POA: Diagnosis not present

## 2021-02-06 DIAGNOSIS — M549 Dorsalgia, unspecified: Secondary | ICD-10-CM | POA: Diagnosis not present

## 2021-02-06 DIAGNOSIS — M6281 Muscle weakness (generalized): Secondary | ICD-10-CM | POA: Diagnosis not present

## 2021-02-06 DIAGNOSIS — S42292A Other displaced fracture of upper end of left humerus, initial encounter for closed fracture: Secondary | ICD-10-CM | POA: Diagnosis not present

## 2021-02-06 DIAGNOSIS — R0902 Hypoxemia: Secondary | ICD-10-CM | POA: Diagnosis not present

## 2021-02-06 DIAGNOSIS — M2550 Pain in unspecified joint: Secondary | ICD-10-CM | POA: Diagnosis not present

## 2021-02-06 DIAGNOSIS — I10 Essential (primary) hypertension: Secondary | ICD-10-CM | POA: Diagnosis not present

## 2021-02-06 DIAGNOSIS — S42202D Unspecified fracture of upper end of left humerus, subsequent encounter for fracture with routine healing: Secondary | ICD-10-CM | POA: Diagnosis not present

## 2021-02-06 DIAGNOSIS — W19XXXA Unspecified fall, initial encounter: Secondary | ICD-10-CM | POA: Diagnosis not present

## 2021-02-06 DIAGNOSIS — Z96612 Presence of left artificial shoulder joint: Secondary | ICD-10-CM | POA: Diagnosis not present

## 2021-02-06 DIAGNOSIS — C50211 Malignant neoplasm of upper-inner quadrant of right female breast: Secondary | ICD-10-CM | POA: Diagnosis not present

## 2021-02-06 DIAGNOSIS — Z170 Estrogen receptor positive status [ER+]: Secondary | ICD-10-CM | POA: Diagnosis not present

## 2021-02-06 DIAGNOSIS — Z853 Personal history of malignant neoplasm of breast: Secondary | ICD-10-CM | POA: Diagnosis not present

## 2021-02-06 DIAGNOSIS — Z79899 Other long term (current) drug therapy: Secondary | ICD-10-CM | POA: Diagnosis not present

## 2021-02-06 DIAGNOSIS — S42292K Other displaced fracture of upper end of left humerus, subsequent encounter for fracture with nonunion: Secondary | ICD-10-CM | POA: Diagnosis not present

## 2021-02-06 LAB — SARS CORONAVIRUS 2 (TAT 6-24 HRS): SARS Coronavirus 2: NEGATIVE

## 2021-02-06 MED ORDER — ASPIRIN 81 MG PO TBEC: 81.0000 mg | DELAYED_RELEASE_TABLET | Freq: Every day | ORAL | 11 refills | Status: DC

## 2021-02-06 MED ORDER — METHOCARBAMOL 500 MG PO TABS: 500.0000 mg | ORAL_TABLET | Freq: Three times a day (TID) | ORAL | 0 refills | Status: DC | PRN

## 2021-02-06 MED ORDER — HEPARIN SOD (PORK) LOCK FLUSH 100 UNIT/ML IV SOLN
500.0000 [IU] | INTRAVENOUS | Status: AC | PRN
  Administered 2021-02-06: 500 [IU]
  Filled 2021-02-06: qty 5

## 2021-02-06 MED ORDER — OXYCODONE HCL 5 MG PO TABS: 5.0000 mg | ORAL_TABLET | ORAL | 0 refills | Status: DC | PRN

## 2021-02-06 MED ORDER — CELECOXIB 100 MG PO CAPS: 100.0000 mg | ORAL_CAPSULE | Freq: Two times a day (BID) | ORAL | 0 refills | Status: AC

## 2021-02-06 NOTE — Discharge Summary (Addendum)
Physician Discharge Summary      Patient ID: [name] MRN: [id number] DOB/AGE: 01/09/1951 70 y.o.  Admit date: 02/02/2021 Discharge date: 02/06/2021  Admission Diagnoses:  Active Problems:   S/P reverse total shoulder arthroplasty, left   Discharge Diagnoses:  Same  Surgeries: Procedure(s): LEFT REVERSE SHOULDER ARTHROPLASTY on 02/02/2021   Consultants:   Discharged Condition: Stable  Hospital Course: [name] is an 70 y.o. female who was admitted 02/02/2021 with a chief complaint of left shoulder pain, and found to have a diagnosis of left shoulder fracture nonunion.  They were brought to the operating room on 02/02/2021 and underwent the above named procedures.  Pt awoke from anesthesia without complication and was transferred to the floor. On POD1, patient had moderate to severe shoulder pain that was difficult to control and difficulty ambulating. Pain improved over the course of her stay but still complained of difficulty walking and lightheadedness.  Did not feel her husband could care for her at home. Discharged to SNF on POD4.  Pt will f/u with Dr. Marlou Sa in clinic in ~2 weeks.   Antibiotics given:  Anti-infectives (From admission, onward)   Start     Dose/Rate Route Frequency Ordered Stop   02/02/21 1700  ceFAZolin (ANCEF) IVPB 2g/100 mL premix        2 g 200 mL/hr over 30 Minutes Intravenous Every 8 hours 02/02/21 1537 02/02/21 2304   02/02/21 1217  vancomycin (VANCOCIN) powder  Status:  Discontinued          As needed 02/02/21 1217 02/02/21 1412   02/02/21 0700  ceFAZolin (ANCEF) IVPB 2g/100 mL premix        2 g 200 mL/hr over 30 Minutes Intravenous On call to O.R. 02/02/21 0737 02/02/21 1062    .  Recent vital signs:  Vitals:   02/05/21 1949 02/06/21 0430  BP: (!) 163/66 (!) 171/72  Pulse: 83 75  Resp: 20 20  Temp: 98.4 F (36.9 C) 98.4 F (36.9 C)  SpO2: 93% 99%    Recent laboratory studies:  Results for orders placed or performed during  the hospital encounter of 02/02/21  CBC  Result Value Ref Range   WBC 10.8 (H) 4.0 - 10.5 K/uL   RBC 3.25 (L) 3.87 - 5.11 MIL/uL   Hemoglobin 10.6 (L) 12.0 - 15.0 g/dL   HCT 33.3 (L) 36.0 - 46.0 %   MCV 102.5 (H) 80.0 - 100.0 fL   MCH 32.6 26.0 - 34.0 pg   MCHC 31.8 30.0 - 36.0 g/dL   RDW 13.2 11.5 - 15.5 %   Platelets 231 150 - 400 K/uL   nRBC 0.0 0.0 - 0.2 %  CBC  Result Value Ref Range   WBC 9.4 4.0 - 10.5 K/uL   RBC 2.93 (L) 3.87 - 5.11 MIL/uL   Hemoglobin 10.0 (L) 12.0 - 15.0 g/dL   HCT 31.0 (L) 36.0 - 46.0 %   MCV 105.8 (H) 80.0 - 100.0 fL   MCH 34.1 (H) 26.0 - 34.0 pg   MCHC 32.3 30.0 - 36.0 g/dL   RDW 13.7 11.5 - 15.5 %   Platelets 219 150 - 400 K/uL   nRBC 0.0 0.0 - 0.2 %    Discharge Medications:   Allergies as of 02/06/2021      Reactions   Ambien [zolpidem Tartrate] Other (See Comments)   Hallucinations and Confusion      Medication List    STOP taking these medications   diazepam 10 MG  tablet Commonly known as: VALIUM   ibuprofen 200 MG tablet Commonly known as: ADVIL     TAKE these medications   amitriptyline 25 MG tablet Commonly known as: ELAVIL Take 75 mg by mouth at bedtime.   amLODipine 5 MG tablet Commonly known as: NORVASC Take 7.5 mg by mouth daily in the afternoon.   aspirin 81 MG EC tablet Take 1 tablet (81 mg total) by mouth daily. Swallow whole.   celecoxib 100 MG capsule Commonly known as: CeleBREX Take 1 capsule (100 mg total) by mouth 2 (two) times daily.   furosemide 20 MG tablet Commonly known as: LASIX Take 1 tablet (20 mg total) by mouth daily as needed for fluid or edema.   levothyroxine 150 MCG tablet Commonly known as: SYNTHROID Take 150 mcg by mouth daily in the afternoon.   lidocaine-prilocaine cream Commonly known as: EMLA Apply to affected area once   loperamide 2 MG tablet Commonly known as: IMODIUM A-D Take 2-4 mg by mouth 4 (four) times daily as needed for diarrhea or loose stools.   methocarbamol  500 MG tablet Commonly known as: ROBAXIN Take 1 tablet (500 mg total) by mouth every 8 (eight) hours as needed for muscle spasms.   metoprolol tartrate 25 MG tablet Commonly known as: LOPRESSOR Take 1 tablet (25 mg total) by mouth 2 (two) times daily.   oxyCODONE 5 MG immediate release tablet Commonly known as: Oxy IR/ROXICODONE Take 1-2 tablets (5-10 mg total) by mouth every 4 (four) hours as needed for moderate pain (pain score 4-6).   saccharomyces boulardii 250 MG capsule Commonly known as: Florastor Take 1 capsule (250 mg total) by mouth 2 (two) times daily.   venlafaxine XR 150 MG 24 hr capsule Commonly known as: EFFEXOR-XR Take 150 mg by mouth daily in the afternoon.   vitamin B-12 1000 MCG tablet Commonly known as: CYANOCOBALAMIN Take 1,000 mcg by mouth daily in the afternoon.       Diagnostic Studies: DG Shoulder Left Port  Result Date: 02/02/2021 CLINICAL DATA:  Status post left shoulder replacement EXAM: LEFT SHOULDER COMPARISON:  11/05/2020 FINDINGS: Left shoulder replacement is noted in satisfactory position. Bony fragments related to prior fracture are noted. IMPRESSION: Status post left shoulder replacement Electronically Signed   By: Inez Catalina M.D.   On: 02/02/2021 15:00    Disposition: Discharge disposition: 03-Skilled Nursing Facility       Discharge Instructions    Call MD / Call 911   Complete by: As directed    If you experience chest pain or shortness of breath, CALL 911 and be transported to the hospital emergency room.  If you develope a fever above 101 F, pus (white drainage) or increased drainage or redness at the wound, or calf pain, call your surgeon's office.   Constipation Prevention   Complete by: As directed    Drink plenty of fluids.  Prune juice may be helpful.  You may use a stool softener, such as Colace (over the counter) 100 mg twice a day.  Use MiraLax (over the counter) for constipation as needed.   Diet - low sodium heart  healthy   Complete by: As directed    Discharge instructions   Complete by: As directed    You may shower, dressing is waterproof.  Do not remove the dressing, we will remove it at your first post-op appointment.  Do not take a bath or soak the knee in a tub or pool.  You may weightbear as you  can tolerate on the operative leg with a walker.  Continue using the CPM machine 3 times per day for one hour each time, increasing the degrees of range of motion daily.  Use the blue cradle boot under your heel to work on getting your leg straight.  Do NOT put a pillow under your knee.  You will follow-up with Dr. Marlou Sa in the clinic in 2 weeks at your given appointment date.    Dental Antibiotics:  In most cases prophylactic antibiotics for Dental procdeures after total joint surgery are not necessary.  Exceptions are as follows:  1. History of prior total joint infection  2. Severely immunocompromised (Organ Transplant, cancer chemotherapy, Rheumatoid biologic meds such as [place])  3. Poorly controlled diabetes (A1C &gt; 8.0, blood glucose over 200)  If you have one of these conditions, contact your surgeon for an antibiotic prescription, prior to your dental procedure.   Increase activity slowly as tolerated   Complete by: As directed          Signed: Donella Stade 02/06/2021, 7:02 AM

## 2021-02-06 NOTE — Social Work (Addendum)
To Whom It May Concern:  Please be advised that the above-named patient will require a short-term nursing home stay - anticipated 30 days or less for rehabilitation and strengthening.  The plan is for return home.  

## 2021-02-06 NOTE — Plan of Care (Signed)
No acute changes since the previous night that I took care of her. Process for SNF placement has started. Will continue to monitor and continue current POC.

## 2021-02-06 NOTE — TOC Transition Note (Signed)
Transition of Care [facility]) - CM/SW Discharge Note   Patient Details  Name: [name] MRN: [id number] Date of Birth: [birth year]  [facility] CM/SW Contact:  Coralee Pesa, [place] Phone Number: 02/06/2021, 4:10 PM   Clinical Narrative:    Nurse to call report to [phone] Nav[facility] [phone] 2/18 - 2/22   Final next level of care: Skilled Nursing Facility Barriers to Discharge: No SNF bed   Patient Goals and CMS Choice Patient states their goals for this hospitalization and ongoing recovery are:: I want my shoulder to heal CMS Medicare.gov Compare Post Acute Care list provided to:: Patient Choice offered to / list presented to : [facility]  Discharge Placement              Patient chooses bed at: [facility] Patient to be transferred to facility by: [facility] Name of family member notified: Doren Custard Patient and family notified of of transfer: 02/06/21  Discharge Plan and Services   Discharge Planning Services: CM Consult                                 Social Determinants of Health ([place]) Interventions     Readmission Risk Interventions No flowsheet data found.

## 2021-02-06 NOTE — Plan of Care (Addendum)
Patient is waiting to be discharged to [facility]. AVS has been printed and discharge packet is ready. PTAR should come and get her tonight. Patient understands. Will continue to monitor.

## 2021-02-06 NOTE — Progress Notes (Signed)
Patient discharged to [facility] via [place] with belongings. Patient made husband aware that she was on her way there. VS taken before discharge and are stable. All questions answered.

## 2021-02-06 NOTE — Progress Notes (Signed)
Physical Therapy Treatment Patient Details Name: [name] MRN: [id number] DOB: 1951-09-11 Today's Date: 02/06/2021    History of Present Illness 70 yo female with L shoulder proximal humerus fracture with nonunion, s/p L reverse shoulder arthroplasty on 2/14. PMH includes anxiety, breast cancer with lumpectomy and radioactive seed placement 2021 and recent port removal, chronic pain, concussion 2008, HTN, depression, spinal fusion 2012.    PT Comments    Session limited by anxiety and self limiting behaviors. Patient ambulated 125' with SPC and min guard. Patient requests assistance for bed mobility, however able to complete with supervision. Patient continues to be [place] with OOB mobility. Continue to recommend SNF for ongoing Physical Therapy.       Follow Up Recommendations  SNF;Supervision for mobility/OOB     Equipment Recommendations  Cane    Recommendations for Other Services       Precautions / Restrictions Precautions Precautions: Fall;Shoulder Type of Shoulder Precautions: NO A/PROM of L shoulder Shoulder Interventions: Shoulder sling/immobilizer;At all times Precaution Booklet Issued: No Precaution Comments: OT provided protocol education with handout Required Braces or Orthoses: Sling Restrictions Weight Bearing Restrictions: Yes LUE Weight Bearing: Non weight bearing    Mobility  Bed Mobility Overal bed mobility: Needs Assistance Bed Mobility: Supine to Sit;Sit to Supine     Supine to sit: Supervision;HOB elevated Sit to supine: Supervision   General bed mobility comments: patient request assistance, however does not require physical assist. Self limiting behavior, patient able to do more than she gives herself credit for    Transfers Overall transfer level: Needs assistance Equipment used: None Transfers: Sit to/from Stand Sit to Stand: Min guard         General transfer comment: min guard for safety  Ambulation/Gait Ambulation/Gait  assistance: Min guard Gait Distance (Feet): 125 Feet Assistive device: Straight cane Gait Pattern/deviations: Step-through pattern;Decreased stride length;Trunk flexed;Wide base of support Gait velocity: decreased   General Gait Details: min guard for safety. Patient with reports being fearful of falling which exacerbates her self limiting behavior and unsteadiness with [facility]   Stairs             Wheelchair Mobility    Modified Rankin (Stroke Patients Only)       Balance Overall balance assessment: Needs assistance;History of Falls Sitting-balance support: No upper extremity supported;Feet supported Sitting balance-Leahy Scale: Good     Standing balance support: Single extremity supported;During functional activity Standing balance-Leahy Scale: Poor Standing balance comment: reliant on UE support with use of SPC and min guard for balance                            Cognition Arousal/Alertness: Awake/alert Behavior During Therapy: WFL for tasks assessed/performed Overall Cognitive Status: Within Functional Limits for tasks assessed                                 General Comments: anxious and self limiting behavior as patient has fear of falling      Exercises      General Comments        Pertinent Vitals/Pain Pain Assessment: Faces Faces Pain Scale: Hurts little more Pain Location: L shoulder Pain Descriptors / Indicators: Sore;Discomfort;Aching Pain Intervention(s): Limited activity within patient's tolerance;Premedicated before session    Home Living  Prior Function            PT Goals (current goals can now be found in the care plan section) Acute Rehab PT Goals Patient Stated Goal: back to baseline PT Goal Formulation: With patient Time For Goal Achievement: 02/17/21 Potential to Achieve Goals: Good Progress towards PT goals: Progressing toward goals    Frequency    Min 4X/week       PT Plan Current plan remains appropriate    Co-evaluation              AM-PAC PT "6 Clicks" Mobility   Outcome Measure  Help needed turning from your back to your side while in a flat bed without using bedrails?: A Little Help needed moving from lying on your back to sitting on the side of a flat bed without using bedrails?: A Little Help needed moving to and from a bed to a chair (including a wheelchair)?: A Little Help needed standing up from a chair using your arms (e.g., wheelchair or bedside chair)?: A Little Help needed to walk in hospital room?: A Little Help needed climbing 3-5 steps with a railing? : A Little 6 Click Score: 18    End of Session Equipment Utilized During Treatment: Gait belt;Other (comment) (L UE sling) Activity Tolerance: Other (comment) (self limiting) Patient left: in bed;with call bell/phone within reach Nurse Communication: Mobility status PT Visit Diagnosis: Unsteadiness on feet (R26.81);Pain;Difficulty in walking, not elsewhere classified (R26.2) Pain - Right/Left: Left Pain - part of body: Shoulder     Time: 4098-1191 PT Time Calculation (min) (ACUTE ONLY): 19 min  Charges:  $Therapeutic Activity: 8-22 mins                     Ayelet Gruenewald A. Gilford Rile PT, DPT Acute Rehabilitation Services Pager [phone] Office [phone]    Linna Hoff 02/06/2021, 2:29 PM

## 2021-02-08 DIAGNOSIS — M19012 Primary osteoarthritis, left shoulder: Secondary | ICD-10-CM

## 2021-02-08 NOTE — Progress Notes (Deleted)
Cardiology Office Note:   Date:  02/08/2021  NAME:  [name]    MRN: [id number] DOB:  16-Dec-1951   PCP:  Maurice Small, MD  Cardiologist:  No primary care provider on file.  Electrophysiologist:  None   Referring MD: Maurice Small, MD   No chief complaint on file. ***  History of Present Illness:   [name] is a 70 y.o. female with a hx of obesity, breast CA s/p treatment, HTN, persistent Afib who presents for follow-up. Was seen 2/9 for new onset Afib. Was going to have chemo port removed and shoulder surgery. We did not delay her surgery. Presents for follow-up.   Problem List 1. Stage IA Breast Cancer -ER+/PR+/HER2+ -lumpectomy 01/22/2020 -radiation 05/2020-06/2020 -s/p adjuvant chemo -on trastuzumab maintenance (completed 01/27/2021) 2. HTN 3. Obesity -BMI 36 4. Former Smoker -21 years  5. Persistent Afib -01/28/2021 -CHADSVASC=3 (age, sex, HTN)  Past Medical History: Past Medical History:  Diagnosis Date  . Anxiety   . Cancer (HCC)    breast  . Chronic pain   . Concussion 3/08   ICU x 3 days  . Depression   . Hypertension   . Hypothyroidism   . Spinal stenosis   . Thyroid disease ?1994    Past Surgical History: Past Surgical History:  Procedure Laterality Date  . BREAST LUMPECTOMY WITH RADIOACTIVE SEED AND SENTINEL LYMPH NODE BIOPSY Right 01/22/2020   Procedure: RIGHT BREAST LUMPECTOMY WITH RADIOACTIVE SEED X2 AND RIGHT SENTINEL LYMPH NODE MAPPING;  Surgeon: Erroll Luna, MD;  Location: [facility];  Service: General;  Laterality: Right;  . BREAST SURGERY Right 4/99   breast biopsy, benign  . EYE SURGERY Right   . PORTACATH PLACEMENT Right 01/22/2020   Procedure: INSERTION PORT-A-CATH WITH ULTRASOUND;  Surgeon: Erroll Luna, MD;  Location: [facility];  Service: General;  Laterality: Right;  . PORTACATH PLACEMENT Right 02/28/2020   Procedure: PORT A CATH REVISION;  Surgeon: Erroll Luna, MD;  Location: [facility];   Service: General;  Laterality: Right;  . REVERSE SHOULDER ARTHROPLASTY Left 02/02/2021   Procedure: LEFT REVERSE SHOULDER ARTHROPLASTY;  Surgeon: Meredith Pel, MD;  Location: [facility];  Service: Orthopedics;  Laterality: Left;  . SHOULDER SURGERY Right   . SPINAL FUSION  03/04/11   with ORIF    Current Medications: No outpatient medications have been marked as taking for the 02/11/21 encounter (Appointment) with O'Neal, Cassie Freer, MD.     Allergies:    Ambien [zolpidem tartrate]   Social History: Social History   Socioeconomic History  . Marital status: Married    Spouse name: Not on file  . Number of children: 1  . Years of education: Not on file  . Highest education level: Not on file  Occupational History  . 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 Smoking status: Former Smoker    Years: 20.00    Types: Cigarettes  . Smokeless tobacco: Never Used  Vaping Use  . Vaping Use: Never used  Substance and Sexual Activity  . Alcohol use: Yes    Alcohol/week: 5.0 standard drinks    Types: 5 Standard drinks or equivalent per week    Comment: wine  . Drug use: No  . Sexual activity: Not Currently    Partners: Male    Birth control/protection: Post-menopausal  Other Topics Concern  . Not on file  Social History Narrative  . Not on file   Social Determinants of Health  Financial Resource Strain: Not on file  Food Insecurity: Not on file  Transportation Needs: Not on file  Physical Activity: Not on file  Stress: Not on file  Social Connections: Not on file     Family History: The patient's ***family history includes Dementia in her mother; Diabetes in her father and sister; Hypertension in her sister; Stroke in her father; Thyroid disease in her mother.  ROS:   All other ROS reviewed and negative. Pertinent positives noted in the HPI.     EKGs/Labs/Other Studies Reviewed:   The following studies were personally reviewed by me today:  EKG:  EKG  is *** ordered today.  The ekg ordered today demonstrates ***, and was personally reviewed by me.   TTE 10/16/2020 1. Left ventricular ejection fraction, by estimation, is 60 to 65%. The  left ventricle has normal function. The left ventricle has no regional  wall motion abnormalities. There is mild concentric left ventricular  hypertrophy. Left ventricular diastolic  parameters are indeterminate.  2. Right ventricular systolic function is normal. The right ventricular  size is normal.  3. The mitral valve is normal in structure. Trivial mitral valve  regurgitation.  4. The aortic valve is normal in structure. Aortic valve regurgitation is  trivial.  5. The inferior vena cava is normal in size with <50% respiratory  variability, suggesting right atrial pressure of 8 mmHg.   Recent Labs: 04/17/2020: Magnesium 1.7 01/06/2021: ALT 21 01/27/2021: BUN 18; Creatinine, Ser 0.81; Potassium 3.9; Sodium 138 01/28/2021: TSH 8.120 02/04/2021: Hemoglobin 10.0; Platelets 219   Recent Lipid Panel No results found for: CHOL, TRIG, HDL, CHOLHDL, VLDL, LDLCALC, LDLDIRECT  Physical Exam:   VS:  LMP  (LMP Unknown)    Wt Readings from Last 3 Encounters:  02/02/21 232 lb 5.8 oz (105.4 kg)  01/28/21 232 lb 6.4 oz (105.4 kg)  01/27/21 200 lb (90.7 kg)    General: Well nourished, well developed, in no acute distress Head: Atraumatic, normal size  Eyes: PEERLA, EOMI  Neck: Supple, no JVD Endocrine: No thryomegaly Cardiac: Normal S1, S2; RRR; no murmurs, rubs, or gallops Lungs: Clear to auscultation bilaterally, no wheezing, rhonchi or rales  Abd: Soft, nontender, no hepatomegaly  Ext: No edema, pulses 2+ Musculoskeletal: No deformities, BUE and BLE strength normal and equal Skin: Warm and dry, no rashes   Neuro: Alert and oriented to person, place, time, and situation, CNII-XII grossly intact, no focal deficits  Psych: Normal mood and affect   ASSESSMENT:   [name] is a 70 y.o. female  who presents for the following: No diagnosis found.  PLAN:   There are no diagnoses linked to this encounter.  Disposition: No follow-ups on file.  Medication Adjustments/Labs and Tests Ordered: Current medicines are reviewed at length with the patient today.  Concerns regarding medicines are outlined above.  No orders of the defined types were placed in this encounter.  No orders of the defined types were placed in this encounter.   There are no Patient Instructions on file for this visit.   Time Spent with Patient: I have spent a total of *** minutes with patient reviewing hospital notes, telemetry, EKGs, labs and examining the patient as well as establishing an assessment and plan that was discussed with the patient.  > 50% of time was spent in direct patient care.  Signed, Addison Naegeli. Audie Box, [place]  [address] [place], [place] [address] [phone]  02/08/2021 7:58 PM

## 2021-02-09 ENCOUNTER — Encounter: Payer: Self-pay | Admitting: Orthopedic Surgery

## 2021-02-09 ENCOUNTER — Telehealth: Payer: Self-pay | Admitting: Orthopedic Surgery

## 2021-02-09 NOTE — Telephone Encounter (Signed)
Patient leaving [employer] [employer] with husband this morning. Husband present at bedside. I have asked to examine patient prior to leaving, but she refuses. She states " I am leaving right now, I do not want to be examined." Facility social worker notified.

## 2021-02-09 NOTE — Progress Notes (Signed)
This encounter was created in error - please disregard.

## 2021-02-10 ENCOUNTER — Telehealth: Payer: Self-pay

## 2021-02-10 MED ORDER — OXYCODONE HCL 5 MG PO TABS: 5.0000 mg | ORAL_TABLET | Freq: Four times a day (QID) | ORAL | 0 refills | Status: DC | PRN

## 2021-02-10 NOTE — Telephone Encounter (Signed)
Can you please send it in?

## 2021-02-10 NOTE — Telephone Encounter (Signed)
Please advise 

## 2021-02-10 NOTE — Telephone Encounter (Signed)
Patient called she is requesting a refill for oxycodone she needs this to be sent to [facility] call [phone]

## 2021-02-10 NOTE — Addendum Note (Signed)
Addended by: Marcene Duos on: 02/10/2021 01:55 PM   Modules accepted: Orders

## 2021-02-10 NOTE — Telephone Encounter (Signed)
Notified submitted

## 2021-02-10 NOTE — Telephone Encounter (Signed)
Ok to rf? 

## 2021-02-11 ENCOUNTER — Ambulatory Visit: Payer: Medicare PPO | Admitting: Cardiovascular Disease

## 2021-02-11 ENCOUNTER — Other Ambulatory Visit: Payer: Self-pay

## 2021-02-11 ENCOUNTER — Other Ambulatory Visit: Payer: Self-pay | Admitting: Nurse Practitioner

## 2021-02-11 DIAGNOSIS — E669 Obesity, unspecified: Secondary | ICD-10-CM

## 2021-02-11 DIAGNOSIS — I4819 Other persistent atrial fibrillation: Secondary | ICD-10-CM

## 2021-02-11 DIAGNOSIS — R52 Pain, unspecified: Secondary | ICD-10-CM

## 2021-02-11 DIAGNOSIS — Z515 Encounter for palliative care: Secondary | ICD-10-CM

## 2021-02-11 DIAGNOSIS — I10 Essential (primary) hypertension: Secondary | ICD-10-CM

## 2021-02-11 NOTE — Progress Notes (Signed)
[facility] Palliative Care Consult Note Telephone: [phone]  Fax: [phone]  PATIENT NAME: [name] [address] [place] [address] [phone] (home)  DOB: 1951/10/14 MRN: [id number]  PRIMARY CARE PROVIDER:    Maurice Small, MD,  [place] [address] [place] [address] [phone]  REFERRING PROVIDER:   Maurice Small, MD [address] [place],  [place] [address] [phone]  RESPONSIBLE PARTY:   Extended Emergency Contact Information Primary Emergency Contact: [name] Address: [address]          [place], [place] [address] Johnnette Litter of [place] Phone: [phone] Mobile Phone: [phone] Relation: Spouse   ASSESSMENT AND RECOMMENDATIONS:   Advance Care Planning: I met face to face with patient in home. Patient's goal of care is function. Patient desire to regain enough function to be able to return to her normal function and not dependent on any one, she would like to drive, do her own hair again. Signed DNR form in home, copy on [facility] EMR.   Symptom Management:  Pain: left shoulder pain related to left shoulder replacement. Current pain management include Ibuprofen 853m every 8hrs as needed and Oxycodone 135mBID. Report 3/10 today, report pain as tolerable. Continue current pain med regimen and adjust regimen as indicated to ensure adequate pain control. Patient awaiting clearance from Ortho to start physical therapy. She voiced no other concerns today, report feeling well. Report improved mood since completion of chemo.  Provided general support and encouragement, no other unmet needs identified. Palliative care will continue to provide support to patient, family and the medical team.  Follow up Palliative Care Visit: Palliative care will continue to follow for complex decision making and symptom management. Return in about 4-6 weeks or prn.  Family  /Caregiver/Community Supports: Patient lives at home with husband, who is her main care giver.Report her children assist as able.  Cognitive / Functional decline: Patient alert and oriented x 4. Completes her ADLs with minimal assist due to left shoulder surgery. She walks independently within her home, uses a cane outside her home. Report improved gait and balance since starting physical therapy, no report of fall in the last month.  I spent 30 minutes providing this consultation, time includes time spent with patient, chart review, provider coordination, and documentation. More than 50% of the time in this consultation was spent counseling and coordinating communication.   CHIEF COMPLAINT: left shoulder pain  History obtained from review of EMR and discussion with patient. Records reviewed and summarized bellow.  HISTORY OF PRESENT ILLNESS:[name] Straughanis a [age].o.year old femalewith multiple medical problems including breast cancer (estrogen +ve), depression, HTN, hypothyroidism, anxiety, hx of spinal fusion with resultant left side weakness and gait issues.Patient currently on Herceptin maintenace for her breast cancer (most recent infusion was yesterday). Patient previously had a lumpectomy and completed adjuvant chemotherapy and radiation treatment. She completed treatment with Herceptin on 01/27/2021 treatment is deemed curative, has a follow up mammogram scheduled for next month. Patient sustained left shoulder fracture from a fall on 08/21/2020, and a left hip fracture from fall on 10/02/2020 followed outpatient by Dr. BlPhilipp OvensShe is s/p left reverse shoulder arthroplasty on 02/02/2021. She was in rehab 3 days and was discharged home per patient request.Palliative Care was asked to helpwith pain management and advance care planning.This is a follow up visit from 01/07/2021.  CODE STATUS: DNR  PPS: 60%  HOSPICE ELIGIBILITY/DIAGNOSIS: TBD  ROS  Constitution: denied fever, denied  chills EYES: denies vision changes ENMT: denies dysphagia Cardiovascular: denied chest pain, denied palpitation Pulmonary: denied cough, denied increased SOB Abdomen: endorses fair appetite, denied constipation, denied incontinence of bowel GU: denied dysuria, denied incontinence of urine MSK: endorses ROM limitations, denied recent  falls  Skin: denied rashes or wounds Neurological:  denies pain, denies insomnia Psych: Endorses positive mood today Heme/lymph/immuno: endorsed easy bruising, denied acute bleed   Physical Exam: Constitutional: NAD,  General: frail appearing, thin EYES: anicteric sclera, lids intact, no discharge  ENMT: intact hearing,oral mucous membranes moist CV:  trace LE edema Pulmonary: no increased work of breathing, no cough, no audible wheezes, room air Abdomen:  no ascites GU: deferred MSK: left arm restricted in sling, ambulatory Skin: warm and dry, no rashes or wounds on visible skin, ecchymotic area noted to right form arm Neuro: non focal Psych: non-anxious affect today Hem/lymph/immuno: no widespread bruising   PAST MEDICAL HISTORY:  Past Medical History:  Diagnosis Date  . Anxiety   . Cancer (HCC)    breast  . Chronic pain   . Concussion 3/08   ICU x 3 days  . Depression   . Hypertension   . Hypothyroidism   . Spinal stenosis   . Thyroid disease ?1994    SOCIAL HX:  Social History   Tobacco Use  . Smoking status: Former Smoker    Years: 20.00    Types: Cigarettes  . Smokeless tobacco: Never Used  Substance Use Topics  . Alcohol use: Yes    Alcohol/week: 5.0 standard drinks    Types: 5 Standard drinks or equivalent per week    Comment: wine   FAMILY HX:  Family History  Problem Relation Age of Onset  . Thyroid disease Mother   . Dementia Mother   . Diabetes Father   . Stroke Father   . Hypertension Sister   . Diabetes Sister     ALLERGIES:  Allergies  Allergen Reactions  . Ambien [Zolpidem Tartrate] Other (See  Comments)    Hallucinations and Confusion     PERTINENT MEDICATIONS:  Outpatient Encounter Medications as of 02/11/2021  Medication Sig  . amitriptyline (ELAVIL) 25 MG tablet Take 75 mg by mouth at bedtime.   Marland Kitchen amLODipine (NORVASC) 5 MG tablet Take 7.5 mg by mouth daily in the afternoon.  Marland Kitchen aspirin EC 81 MG EC tablet Take 1 tablet (81 mg total) by mouth daily. Swallow whole.  . celecoxib (CELEBREX) 100 MG capsule Take 1 capsule (100 mg total) by mouth 2 (two) times daily.  . furosemide (LASIX) 20 MG tablet Take 20 mg by mouth daily. For HTN  . levothyroxine (SYNTHROID) 150 MCG tablet Take 150 mcg by mouth daily in the afternoon.  . lidocaine-prilocaine (EMLA) cream Apply to affected area once  . loperamide (IMODIUM A-D) 2 MG tablet Take 2-4 mg by mouth 4 (four) times daily as needed for diarrhea or loose stools.  . methocarbamol (ROBAXIN) 500 MG tablet Take 1 tablet (500 mg total) by mouth every 8 (eight) hours as needed for muscle spasms.  . metoprolol tartrate (LOPRESSOR) 25 MG tablet Take 1 tablet (25 mg total) by mouth 2 (two) times daily.  Marland Kitchen oxyCODONE (OXY IR/ROXICODONE) 5 MG immediate release tablet Take 1 tablet (5 mg total) by mouth every 6 (six) hours as needed for moderate pain. Give 10 mg by mouth every 4 hours as needed for Severe Pain for 7 days  . saccharomyces boulardii (FLORASTOR) 250 MG capsule Take  1 capsule (250 mg total) by mouth 2 (two) times daily.  Marland Kitchen venlafaxine XR (EFFEXOR-XR) 150 MG 24 hr capsule Take 150 mg by mouth daily in the afternoon.  . vitamin B-12 (CYANOCOBALAMIN) 1000 MCG tablet Take 1,000 mcg by mouth daily in the afternoon.   No facility-administered encounter medications on file as of 02/11/2021.    Thank you for the opportunity to participate in the care of Ms. Shanda Bumps. The palliative care team will continue to follow. Please call our office at [phone] if we can be of additional assistance.  Jari Favre, DNP, AGPCNP-BC

## 2021-02-13 NOTE — Addendum Note (Signed)
Addended by: Meryl Crutch on: 02/13/2021 10:55 AM   Modules accepted: Orders

## 2021-02-16 ENCOUNTER — Ambulatory Visit (INDEPENDENT_AMBULATORY_CARE_PROVIDER_SITE_OTHER): Payer: Medicare PPO

## 2021-02-16 ENCOUNTER — Ambulatory Visit (INDEPENDENT_AMBULATORY_CARE_PROVIDER_SITE_OTHER): Payer: Medicare PPO | Admitting: Orthopedic Surgery

## 2021-02-16 DIAGNOSIS — Z96612 Presence of left artificial shoulder joint: Secondary | ICD-10-CM

## 2021-02-17 ENCOUNTER — Telehealth: Payer: Self-pay

## 2021-02-17 DIAGNOSIS — F419 Anxiety disorder, unspecified: Secondary | ICD-10-CM | POA: Diagnosis not present

## 2021-02-17 DIAGNOSIS — E559 Vitamin D deficiency, unspecified: Secondary | ICD-10-CM | POA: Diagnosis not present

## 2021-02-17 DIAGNOSIS — F1010 Alcohol abuse, uncomplicated: Secondary | ICD-10-CM | POA: Diagnosis not present

## 2021-02-17 DIAGNOSIS — Z09 Encounter for follow-up examination after completed treatment for conditions other than malignant neoplasm: Secondary | ICD-10-CM | POA: Diagnosis not present

## 2021-02-17 DIAGNOSIS — M6281 Muscle weakness (generalized): Secondary | ICD-10-CM | POA: Diagnosis not present

## 2021-02-17 NOTE — Telephone Encounter (Signed)
Patient stated that she was advised to use the machine at 30 degrees for her left shoulder, but stated that the machine starts at 45 degrees.  Would like to know what she needs to do?  Cb# [phone].  Please advise.  Thank you.

## 2021-02-17 NOTE — Telephone Encounter (Signed)
Please advise. Thanks.  

## 2021-02-17 NOTE — Telephone Encounter (Signed)
[place] for 64 thx

## 2021-02-17 NOTE — Telephone Encounter (Signed)
IC s/w patient and advised  

## 2021-02-18 ENCOUNTER — Encounter: Payer: Self-pay | Admitting: Orthopedic Surgery

## 2021-02-18 NOTE — Progress Notes (Signed)
Post-Op Visit Note   Patient: [name]           Date of Birth: 1951/05/10           MRN: [id number] Visit Date: 02/16/2021 PCP: Isabel Small, MD   Assessment & Plan:  Chief Complaint:  Chief Complaint  Patient presents with  . Left Shoulder - Routine Post Op   Visit Diagnoses:  1. History of arthroplasty of left shoulder     Plan: [name] is a patient underwent left reverse shoulder replacement 02/02/2021.  She has poor bone quality and we have been restricting her motion to allow for fracture healing to occur around the tuberosities.  She also had osteophytes present around the glenoid.  On examination deltoid fires.  Incision is intact.  Plan is to start passive motion only for 1 hour twice a day for the next 2 weeks.  We will likely start occupational therapy at that time.  Needs repeat radiographs on return to assess for tuberosity union.  Follow-Up Instructions: Return in about 2 weeks (around 03/02/2021).   Orders:  Orders Placed This Encounter  Procedures  . XR Shoulder Left   No orders of the defined types were placed in this encounter.   Imaging: No results found.  PMFS History: Patient Active Problem List   Diagnosis Date Noted  . Arthritis of left shoulder region   . S/P reverse total shoulder arthroplasty, 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  . Vitamin D deficiency 01/15/2021  . Vitamin B12 deficiency 01/15/2021  . Primary insomnia 01/15/2021  . Morbid obesity ([facility] [employer]) 01/15/2021  . Moderate recurrent major depression ([facility]) 01/15/2021  . Benzodiazepine dependence ([facility]) 01/15/2021  . Port-A-Cath in place 07/22/2020  . Left leg cellulitis 04/17/2020  . Cellulitis of left leg 04/17/2020  . Hypokalemia 04/17/2020  . Macrocytic anemia 04/17/2020  . Anxiety 04/17/2020  . Depression 04/17/2020  . Chronic diarrhea 04/17/2020  . Chemotherapy induced diarrhea 04/17/2020  . Malignant neoplasm of upper-inner quadrant of right breast in female, estrogen receptor positive ([facility])  12/26/2019  . Encephalopathy 02/02/2018  . Hypothyroidism 02/02/2018  . AKI (acute kidney injury) ([facility]) 02/02/2018  . Chronic back pain 02/02/2018  . Alcohol abuse 02/02/2018  . Weight gain 02/02/2018  . Benign essential HTN 02/02/2018   Past Medical History:  Diagnosis Date  . Anxiety   . Cancer (HCC)    breast  . Chronic pain   . Concussion 3/08   ICU x 3 days  . Depression   . Hypertension   . Hypothyroidism   . Spinal stenosis   . Thyroid disease ?1994    Family History  Problem Relation Age of Onset  . Thyroid disease Mother   . Dementia Mother   . Diabetes Father   . Stroke Father   . Hypertension Sister   . Diabetes Sister     Past Surgical History:  Procedure Laterality Date  . BREAST LUMPECTOMY WITH RADIOACTIVE SEED AND SENTINEL LYMPH NODE BIOPSY Right 01/22/2020   Procedure: RIGHT BREAST LUMPECTOMY WITH RADIOACTIVE SEED X2 AND RIGHT SENTINEL LYMPH NODE MAPPING;  Surgeon: Erroll Luna, MD;  Location: [facility];  Service: General;  Laterality: Right;  . BREAST SURGERY Right 4/99   breast biopsy, benign  . EYE SURGERY Right   . PORTACATH PLACEMENT Right 01/22/2020   Procedure: INSERTION PORT-A-CATH WITH ULTRASOUND;  Surgeon: Erroll Luna, MD;  Location: [facility];  Service: General;  Laterality: Right;  . PORTACATH PLACEMENT Right 02/28/2020   Procedure: PORT  A CATH REVISION;  Surgeon: Erroll Luna, MD;  Location: [facility];  Service: General;  Laterality: Right;  . REVERSE SHOULDER ARTHROPLASTY Left 02/02/2021   Procedure: LEFT REVERSE SHOULDER ARTHROPLASTY;  Surgeon: Meredith Pel, MD;  Location: [facility];  Service: Orthopedics;  Laterality: Left;  . SHOULDER SURGERY Right   . SPINAL FUSION  03/04/11   with ORIF   Social History   Occupational History  . 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 Smoking status: Former Smoker    Years: 20.00    Types: Cigarettes  . Smokeless tobacco: Never Used  Vaping Use   . Vaping Use: Never used  Substance and Sexual Activity  . Alcohol use: Yes    Alcohol/week: 5.0 standard drinks    Types: 5 Standard drinks or equivalent per week    Comment: wine  . Drug use: No  . Sexual activity: Not Currently    Partners: Male    Birth control/protection: Post-menopausal

## 2021-02-22 NOTE — Progress Notes (Signed)
Cardiology Office Note:   Date:  02/25/2021  NAME:  [name]    MRN: [id number] DOB:  12-16-1951   PCP:  Maurice Small, MD  Cardiologist:  No primary care provider on file.  Electrophysiologist:  None   Referring MD: Maurice Small, MD   Chief Complaint  Patient presents with  . Follow-up        History of Present Illness:   [name] is a 70 y.o. female with a hx of breast CA s/p chemo/rx/lumpectomy on adjuvant chemoprophylaxis, HTN, obesity who presents for follow-up of atrial fibrillation. Recently seen for Afib before surgery for port removal and shoulder surgery. Recent echo was normal so we did not delay her surgery. She presents to follow-up today.  Her TSH was found to be abnormal.  Her primary care physician has increased her Synthroid hormone.  Her EKG shows she is still in atrial fibrillation.  Despite this she reports no symptoms.  She did undergo left shoulder surgery.  Apparently her port was not removed.  They have plans to remove the port after she recovers from shoulder surgery.  She reports no chest pain or shortness of breath.  She does have lower extremity edema in her left leg.  This is related to dropping a heavy weight on this a number of years ago.  She takes Lasix intermittently for this.  She reports she is doing well since surgery.  She is still recovering from chemotherapy and all of her treatment for her breast cancer.  She is also now recovering from left shoulder surgery.  Her atrial fibrillation does not bother her.  We discussed there is no strong indication to choose rhythm control.  I think rate control be an acceptable strategy for her.  We do need to switch her over to Eliquis.  She does understand this.  Blood pressure 140/82 today.  She is without symptoms.  Problem List 1. Stage IA Breast Cancer -ER+/PR+/HER2+ -lumpectomy 01/22/2020 -radiation 05/2020-06/2020 -s/p adjuvant chemo -on trastuzumab maintenance (completed 01/27/2021) 2. HTN 3.  Obesity -BMI 36 4. Former Smoker -59 years  5. Persistent atrial fibrillation  -Dx 01/27/2021 -CHADVASC=3 (age, sex, HTN) 6. LLE edema -negative DVT study -likely 2/2 trauma   Past Medical History: Past Medical History:  Diagnosis Date  . Anxiety   . Cancer (HCC)    breast  . Chronic pain   . Concussion 3/08   ICU x 3 days  . Depression   . Hypertension   . Hypothyroidism   . Spinal stenosis   . Thyroid disease ?1994    Past Surgical History: Past Surgical History:  Procedure Laterality Date  . BREAST LUMPECTOMY WITH RADIOACTIVE SEED AND SENTINEL LYMPH NODE BIOPSY Right 01/22/2020   Procedure: RIGHT BREAST LUMPECTOMY WITH RADIOACTIVE SEED X2 AND RIGHT SENTINEL LYMPH NODE MAPPING;  Surgeon: Erroll Luna, MD;  Location: [place];  Service: General;  Laterality: Right;  . BREAST SURGERY Right 4/99   breast biopsy, benign  . EYE SURGERY Right   . PORTACATH PLACEMENT Right 01/22/2020   Procedure: INSERTION PORT-A-CATH WITH ULTRASOUND;  Surgeon: Erroll Luna, MD;  Location: [place];  Service: General;  Laterality: Right;  . PORTACATH PLACEMENT Right 02/28/2020   Procedure: PORT A CATH REVISION;  Surgeon: Erroll Luna, MD;  Location: [place];  Service: General;  Laterality: Right;  . REVERSE SHOULDER ARTHROPLASTY Left 02/02/2021   Procedure: LEFT REVERSE SHOULDER ARTHROPLASTY;  Surgeon: Meredith Pel, MD;  Location: [facility];  Service: Orthopedics;  Laterality: Left;  . SHOULDER SURGERY Right   . SPINAL FUSION  03/04/11   with ORIF    Current Medications: Current Meds  Medication Sig  . amitriptyline (ELAVIL) 25 MG tablet Take 75 mg by mouth at bedtime.   Marland Kitchen amLODipine (NORVASC) 5 MG tablet Take 7.5 mg by mouth daily in the afternoon.  Marland Kitchen apixaban (ELIQUIS) 5 MG TABS tablet Take 1 tablet (5 mg total) by mouth 2 (two) times daily.  . celecoxib (CELEBREX) 100 MG capsule Take 1 capsule (100 mg total) by mouth 2 (two) times daily.  .  diazepam (VALIUM) 10 MG tablet 1 tablet as needed  . furosemide (LASIX) 20 MG tablet Take 20 mg by mouth daily. For HTN  . levothyroxine (SYNTHROID) 150 MCG tablet Take 150 mcg by mouth daily in the afternoon.  . lidocaine-prilocaine (EMLA) cream Apply to affected area once  . loperamide (IMODIUM A-D) 2 MG tablet Take 2-4 mg by mouth 4 (four) times daily as needed for diarrhea or loose stools.  . methocarbamol (ROBAXIN) 500 MG tablet Take 1 tablet (500 mg total) by mouth every 8 (eight) hours as needed for muscle spasms.  . metoprolol tartrate (LOPRESSOR) 25 MG tablet Take 1 tablet (25 mg total) by mouth 2 (two) times daily.  Marland Kitchen oxyCODONE (OXY IR/ROXICODONE) 5 MG immediate release tablet Take 1 tablet (5 mg total) by mouth every 6 (six) hours as needed for moderate pain. Give 10 mg by mouth every 4 hours as needed for Severe Pain for 7 days  . saccharomyces boulardii (FLORASTOR) 250 MG capsule Take 1 capsule (250 mg total) by mouth 2 (two) times daily.  Marland Kitchen venlafaxine XR (EFFEXOR-XR) 150 MG 24 hr capsule Take 150 mg by mouth daily in the afternoon.  . vitamin B-12 (CYANOCOBALAMIN) 1000 MCG tablet Take 1,000 mcg by mouth daily in the afternoon.  . [DISCONTINUED] aspirin EC 81 MG EC tablet Take 1 tablet (81 mg total) by mouth daily. Swallow whole.     Allergies:    Ambien [zolpidem tartrate]   Social History: Social History   Socioeconomic History  . Marital status: Married    Spouse name: Not on file  . Number of children: 1  . Years of education: Not on file  . Highest education level: Not on file  Occupational History  . 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 Smoking status: Former Smoker    Years: 20.00    Types: Cigarettes  . Smokeless tobacco: Never Used  Vaping Use  . Vaping Use: Never used  Substance and Sexual Activity  . Alcohol use: Yes    Alcohol/week: 5.0 standard drinks    Types: 5 Standard drinks or equivalent per week    Comment: wine  . Drug use: No   . Sexual activity: Not Currently    Partners: Male    Birth control/protection: Post-menopausal  Other Topics Concern  . Not on file  Social History Narrative  . Not on file   Social Determinants of Health   Financial Resource Strain: Not on file  Food Insecurity: Not on file  Transportation Needs: Not on file  Physical Activity: Not on file  Stress: Not on file  Social Connections: Not on file     Family History: The patient's family history includes Dementia in her mother; Diabetes in her father and sister; Hypertension in her sister; Stroke in her father; Thyroid disease in her mother.  ROS:   All other ROS reviewed and  negative. Pertinent positives noted in the HPI.     EKGs/Labs/Other Studies Reviewed:   The following studies were personally reviewed by me today:  EKG:  EKG is ordered today.  The ekg ordered today demonstrates atrial fibrillation heart rate 63, no acute ischemic changes, no evidence of infarction, and was personally reviewed by me.   TTE 10/16/2020 1. Left ventricular ejection fraction, by estimation, is 60 to 65%. The  left ventricle has normal function. The left ventricle has no regional  wall motion abnormalities. There is mild concentric left ventricular  hypertrophy. Left ventricular diastolic  parameters are indeterminate.  2. Right ventricular systolic function is normal. The right ventricular  size is normal.  3. The mitral valve is normal in structure. Trivial mitral valve  regurgitation.  4. The aortic valve is normal in structure. Aortic valve regurgitation is  trivial.  5. The inferior vena cava is normal in size with <50% respiratory  variability, suggesting right atrial pressure of 8 mmHg.   Recent Labs: 04/17/2020: Magnesium 1.7 01/06/2021: ALT 21 01/27/2021: BUN 18; Creatinine, Ser 0.81; Potassium 3.9; Sodium 138 01/28/2021: TSH 8.120 02/04/2021: Hemoglobin 10.0; Platelets 219   Recent Lipid Panel No results found for: CHOL,  TRIG, HDL, CHOLHDL, VLDL, LDLCALC, LDLDIRECT  Physical Exam:   VS:  BP 140/82   Pulse 63   Ht '5\' 7"'  (1.702 m)   LMP  (LMP Unknown)   SpO2 96%   BMI 36.39 kg/m    Wt Readings from Last 3 Encounters:  02/02/21 232 lb 5.8 oz (105.4 kg)  01/28/21 232 lb 6.4 oz (105.4 kg)  01/27/21 200 lb (90.7 kg)    General: Well nourished, well developed, in no acute distress Head: Atraumatic, normal size  Eyes: PEERLA, EOMI  Neck: Supple, no JVD Endocrine: No thryomegaly Cardiac: Normal S1, S2; irregular rhythm, no murmurs rubs or gallops Lungs: Clear to auscultation bilaterally, no wheezing, rhonchi or rales  Abd: Soft, nontender, no hepatomegaly  Ext: Trace edema in the left lower extremity Musculoskeletal: No deformities, BUE and BLE strength normal and equal Skin: Warm and dry, no rashes   Neuro: Alert and oriented to person, place, time, and situation, CNII-XII grossly intact, no focal deficits  Psych: Normal mood and affect   ASSESSMENT:   [name] is a 70 y.o. female who presents for the following: 1. Persistent atrial fibrillation ([place])   2. Primary hypertension   3. Obesity (BMI 30-39.9)     PLAN:   1. Persistent atrial fibrillation ([place]) -Recent diagnosis of atrial fibrillation on 01/27/2021. CHADSVASC=3.  She saw me for preoperative assessment.  Found to have A. fib.  We did not delay her procedures that she was in rate controlled atrial fibrillation.  She had a recent echocardiogram that was normal as part of her surveillance for any chemotherapy-induced cardiomyopathy.  She has no evidence of heart failure.  She has no symptoms from her atrial fibrillation.  We discussed treatment strategies including rate control versus rhythm control strategy.  Given the lack of symptoms I recommended rate control strategy.  She needs a repeat echocardiogram to evaluate her LV function.  We need to make sure there is been no change.  Overall based on her symptoms I do not suspect this is the  case.  Her thyroid studies were also abnormal.  Recent change by primary care physician has increased her thyroid dose.  Maybe this will help.  She is also gained weight and may have sleep apnea.  She will  keep a close eye on snoring and fatigue.  She reports no alarming symptoms today. -We also discussed the need for anticoagulation based on her stroke risk score.  We will stop her aspirin 81 mg daily.  She will start Eliquis 5 mg twice daily. -She will see me back in 6 months after the above testing.  2. Primary hypertension -Well-controlled today.  No change in medications.  3. Obesity (BMI 30-39.9) -Diet and exercise recommended.  Disposition: Return in about 6 months (around 08/28/2021).  Medication Adjustments/Labs and Tests Ordered: Current medicines are reviewed at length with the patient today.  Concerns regarding medicines are outlined above.  Orders Placed This Encounter  Procedures  . EKG 12-Lead  . ECHOCARDIOGRAM COMPLETE   Meds ordered this encounter  Medications  . apixaban (ELIQUIS) 5 MG TABS tablet    Sig: Take 1 tablet (5 mg total) by mouth 2 (two) times daily.    Dispense:  60 tablet    Refill:  2    Patient Instructions  Medication Instructions:  Stop Aspirin  Start Eliquis 5 mg twice daily   *If you need a refill on your cardiac medications before your next appointment, please call your pharmacy*   Testing/Procedures: Echocardiogram - Your physician has requested that you have an echocardiogram. Echocardiography is a painless test that uses sound waves to create images of your heart. It provides your doctor with information about the size and shape of your heart and how well your heart's chambers and valves are working. This procedure takes approximately one hour. There are no restrictions for this procedure. This will be performed at our [facility] location - [address].    Follow-Up: At [facility], you and your health needs are our  priority.  As part of our continuing mission to provide you with exceptional heart care, we have created designated Provider Care Teams.  These Care Teams include your primary Cardiologist (physician) and Advanced Practice Providers (APPs -  Physician Assistants and Nurse Practitioners) who all work together to provide you with the care you need, when you need it.  We recommend signing up for the patient portal called "MyChart".  Sign up information is provided on this After Visit Summary.  MyChart is used to connect with patients for Virtual Visits (Telemedicine).  Patients are able to view lab/test results, encounter notes, upcoming appointments, etc.  Non-urgent messages can be sent to your provider as well.   To learn more about what you can do with MyChart, go to [email or web address].    Your next appointment:   6 month(s)  The format for your next appointment:   In Person  Provider:   Eleonore Chiquito, MD        Time Spent with Patient: I have spent a total of 25 minutes with patient reviewing hospital notes, telemetry, EKGs, labs and examining the patient as well as establishing an assessment and plan that was discussed with the patient.  > 50% of time was spent in direct patient care.  Signed, Addison Naegeli. Audie Box, MD, [place]  [address] [place], [place] [address] [phone]  02/25/2021 10:22 AM

## 2021-02-23 ENCOUNTER — Telehealth: Payer: Self-pay

## 2021-02-23 NOTE — Telephone Encounter (Signed)
Tried calling patient. No answer. LMVM advising had talked with Dr Marlou Sa and he would like for patient to stay at 41 until she comes back into the office for for follow up

## 2021-02-23 NOTE — Telephone Encounter (Signed)
Pt is on her 2nd week working her shoulder and would like to know if she needs to move up. She is currently at 73 she would like to know if she needs to go up to 60

## 2021-02-25 ENCOUNTER — Other Ambulatory Visit: Payer: Self-pay

## 2021-02-25 ENCOUNTER — Encounter: Payer: Self-pay | Admitting: Cardiovascular Disease

## 2021-02-25 ENCOUNTER — Ambulatory Visit (INDEPENDENT_AMBULATORY_CARE_PROVIDER_SITE_OTHER): Payer: Medicare PPO | Admitting: Cardiovascular Disease

## 2021-02-25 VITALS — BP 140/82 | HR 63 | Ht 67.0 in

## 2021-02-25 DIAGNOSIS — I10 Essential (primary) hypertension: Secondary | ICD-10-CM | POA: Diagnosis not present

## 2021-02-25 DIAGNOSIS — I4819 Other persistent atrial fibrillation: Secondary | ICD-10-CM

## 2021-02-25 DIAGNOSIS — E669 Obesity, unspecified: Secondary | ICD-10-CM

## 2021-02-25 MED ORDER — APIXABAN 5 MG PO TABS: 5.0000 mg | ORAL_TABLET | Freq: Two times a day (BID) | ORAL | 2 refills | Status: DC

## 2021-02-25 NOTE — Patient Instructions (Signed)
Medication Instructions:  Stop Aspirin  Start Eliquis 5 mg twice daily   *If you need a refill on your cardiac medications before your next appointment, please call your pharmacy*   Testing/Procedures: Echocardiogram - Your physician has requested that you have an echocardiogram. Echocardiography is a painless test that uses sound waves to create images of your heart. It provides your doctor with information about the size and shape of your heart and how well your heart's chambers and valves are working. This procedure takes approximately one hour. There are no restrictions for this procedure. This will be performed at our [facility] location - [address], [address].    Follow-Up: At [facility], you and your health needs are our priority.  As part of our continuing mission to provide you with exceptional heart care, we have created designated Provider Care Teams.  These Care Teams include your primary Cardiologist (physician) and Advanced Practice Providers (APPs -  Physician Assistants and Nurse Practitioners) who all work together to provide you with the care you need, when you need it.  We recommend signing up for the patient portal called "MyChart".  Sign up information is provided on this After Visit Summary.  MyChart is used to connect with patients for Virtual Visits (Telemedicine).  Patients are able to view lab/test results, encounter notes, upcoming appointments, etc.  Non-urgent messages can be sent to your provider as well.   To learn more about what you can do with MyChart, go to [email or web address].    Your next appointment:   6 month(s)  The format for your next appointment:   In Person  Provider:   Eleonore Chiquito, MD

## 2021-03-02 ENCOUNTER — Ambulatory Visit (INDEPENDENT_AMBULATORY_CARE_PROVIDER_SITE_OTHER): Payer: Medicare PPO

## 2021-03-02 ENCOUNTER — Other Ambulatory Visit: Payer: Self-pay

## 2021-03-02 ENCOUNTER — Other Ambulatory Visit: Payer: Self-pay | Admitting: Surgical

## 2021-03-02 ENCOUNTER — Ambulatory Visit (INDEPENDENT_AMBULATORY_CARE_PROVIDER_SITE_OTHER): Payer: Medicare PPO | Admitting: Orthopedic Surgery

## 2021-03-02 DIAGNOSIS — M25512 Pain in left shoulder: Secondary | ICD-10-CM

## 2021-03-02 MED ORDER — OXYCODONE HCL 5 MG PO TABS: 5.0000 mg | ORAL_TABLET | Freq: Four times a day (QID) | ORAL | 0 refills | Status: DC | PRN

## 2021-03-02 MED ORDER — METHOCARBAMOL 500 MG PO TABS: 500.0000 mg | ORAL_TABLET | Freq: Three times a day (TID) | ORAL | 0 refills | Status: DC | PRN

## 2021-03-02 NOTE — Telephone Encounter (Signed)
Pls advise.  

## 2021-03-04 ENCOUNTER — Encounter: Payer: Self-pay | Admitting: Orthopedic Surgery

## 2021-03-04 NOTE — Progress Notes (Signed)
Post-Op Visit Note   Patient: [name]           Date of Birth: 1951-08-06           MRN: [id number] Visit Date: 03/02/2021 PCP: Isabel Small, MD   Assessment & Plan:  Chief Complaint:  Chief Complaint  Patient presents with  . Left Shoulder - Follow-up   Visit Diagnoses:  1. Acute pain of left shoulder     Plan: [name] is a 70 year old patient is a month out left reverse shoulder replacement.  Bone quality poor at the time.  She had nonunion.  On exam she CPM 45 a day for 2 hours.  Moving up to 60 degrees/day.  Like for her to go to 90 degrees next week if possible.  Discontinue the sling in 2 weeks.  No sling at night.  Deltoid is functional.  Radiographs no change.  Come back in 6 weeks to see [name] for repeat clinical assessment.  Decide then for or against any x-rays but essentially at this point make sure that her motion is progressing.  Follow-Up Instructions: Return in about 2 weeks (around 03/16/2021).   Orders:  Orders Placed This Encounter  Procedures  . XR Shoulder Left   Meds ordered this encounter  Medications  . oxyCODONE (OXY IR/ROXICODONE) 5 MG immediate release tablet    Sig: Take 1 tablet (5 mg total) by mouth every 6 (six) hours as needed for moderate pain. Give 10 mg by mouth every 4 hours as needed for Severe Pain for 7 days    Dispense:  30 tablet    Refill:  0  . methocarbamol (ROBAXIN) 500 MG tablet    Sig: Take 1 tablet (500 mg total) by mouth every 8 (eight) hours as needed for muscle spasms.    Dispense:  30 tablet    Refill:  0    Imaging: No results found.  PMFS History: Patient Active Problem List   Diagnosis Date Noted  . Arthritis of left shoulder region   . S/P reverse total shoulder arthroplasty, 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  . Vitamin D deficiency 01/15/2021  . Vitamin B12 deficiency 01/15/2021  . Primary insomnia 01/15/2021  . Morbid obesity ([name]) 01/15/2021  . Moderate recurrent major depression ([place]) 01/15/2021  . Benzodiazepine  dependence ([place]) 01/15/2021  . Port-A-Cath in place 07/22/2020  . Left leg cellulitis 04/17/2020  . Cellulitis of left leg 04/17/2020  . Hypokalemia 04/17/2020  . Macrocytic anemia 04/17/2020  . Anxiety 04/17/2020  . Depression 04/17/2020  . Chronic diarrhea 04/17/2020  . Chemotherapy induced diarrhea 04/17/2020  . Malignant neoplasm of upper-inner quadrant of right breast in female, estrogen receptor positive ([place]) 12/26/2019  . Encephalopathy 02/02/2018  . Hypothyroidism 02/02/2018  . AKI (acute kidney injury) ([place]) 02/02/2018  . Chronic back pain 02/02/2018  . Alcohol abuse 02/02/2018  . Weight gain 02/02/2018  . Benign essential HTN 02/02/2018   Past Medical History:  Diagnosis Date  . Anxiety   . Cancer (HCC)    breast  . Chronic pain   . Concussion 3/08   ICU x 3 days  . Depression   . Hypertension   . Hypothyroidism   . Spinal stenosis   . Thyroid disease ?1994    Family History  Problem Relation Age of Onset  . Thyroid disease Mother   . Dementia Mother   . Diabetes Father   . Stroke Father   . Hypertension Sister   . Diabetes Sister     Past  Surgical History:  Procedure Laterality Date  . BREAST LUMPECTOMY WITH RADIOACTIVE SEED AND SENTINEL LYMPH NODE BIOPSY Right 01/22/2020   Procedure: RIGHT BREAST LUMPECTOMY WITH RADIOACTIVE SEED X2 AND RIGHT SENTINEL LYMPH NODE MAPPING;  Surgeon: Erroll Luna, MD;  Location: [place];  Service: General;  Laterality: Right;  . BREAST SURGERY Right 4/99   breast biopsy, benign  . EYE SURGERY Right   . PORTACATH PLACEMENT Right 01/22/2020   Procedure: INSERTION PORT-A-CATH WITH ULTRASOUND;  Surgeon: Erroll Luna, MD;  Location: [place];  Service: General;  Laterality: Right;  . PORTACATH PLACEMENT Right 02/28/2020   Procedure: PORT A CATH REVISION;  Surgeon: Erroll Luna, MD;  Location: [place];  Service: General;  Laterality: Right;  . REVERSE SHOULDER ARTHROPLASTY Left 02/02/2021    Procedure: LEFT REVERSE SHOULDER ARTHROPLASTY;  Surgeon: Meredith Pel, MD;  Location: [place];  Service: Orthopedics;  Laterality: Left;  . SHOULDER SURGERY Right   . SPINAL FUSION  03/04/11   with ORIF   Social History   Occupational History  . 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 Smoking status: Former Smoker    Years: 20.00    Types: Cigarettes  . Smokeless tobacco: Never Used  Vaping Use  . Vaping Use: Never used  Substance and Sexual Activity  . Alcohol use: Yes    Alcohol/week: 5.0 standard drinks    Types: 5 Standard drinks or equivalent per week    Comment: wine  . Drug use: No  . Sexual activity: Not Currently    Partners: Male    Birth control/protection: Post-menopausal

## 2021-03-05 ENCOUNTER — Ambulatory Visit: Payer: Self-pay | Admitting: Surgery

## 2021-03-14 IMAGING — CT CT TIBIA FIBULA *L* W/ CM
3 of 5 series · 16 of 34 positions shown, 19 images · IV contrast (OMNIPAQUE)
Comparison: None.

CONTRAST:  100mL OMNIPAQUE IOHEXOL 300 MG/ML  SOLN

CLINICAL DATA: Cellulitis of the left lower leg.

EXAM:
CT OF THE LOWER RIGHT EXTREMITY WITH CONTRAST
TECHNIQUE: Multidetector CT imaging of the lower right extremity was performed
according to the standard protocol following intravenous contrast
administration.

[Series 6: axial st left tib/fib · axial · 0.53mm/px · z∈[-1326,-927]mm · 10 of 316 slices shown, 13 images]
[im 25/316  soft-tissue]
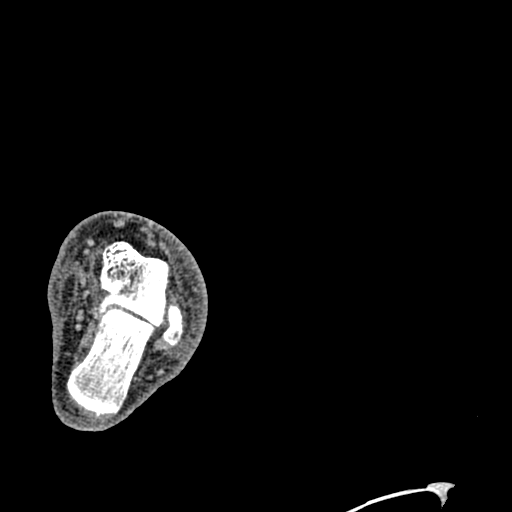
[im 25/316  bone]
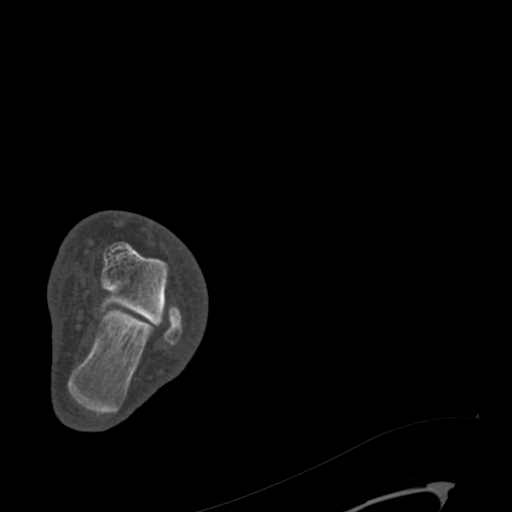
[im 49/316  bone]
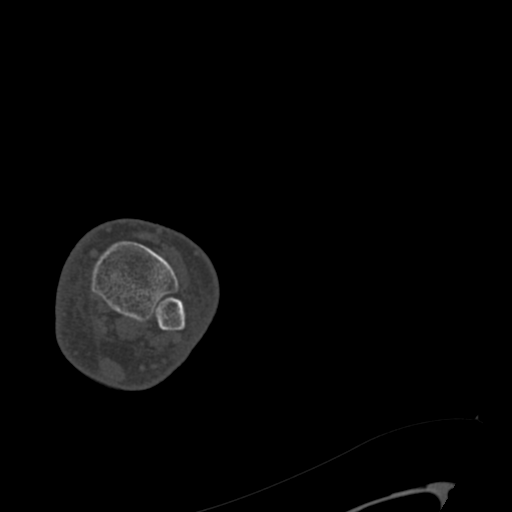
[im 97/316  bone]
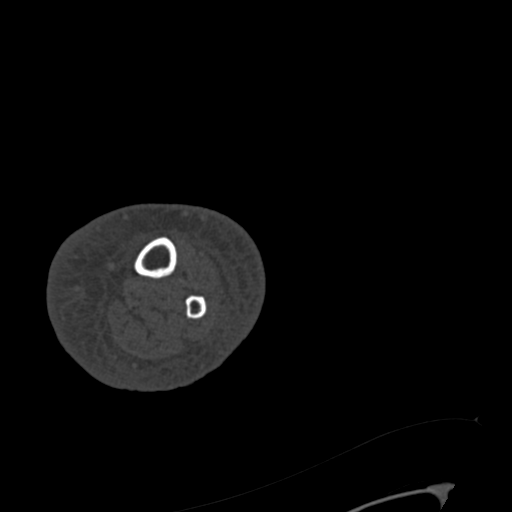
[im 122/316  bone]
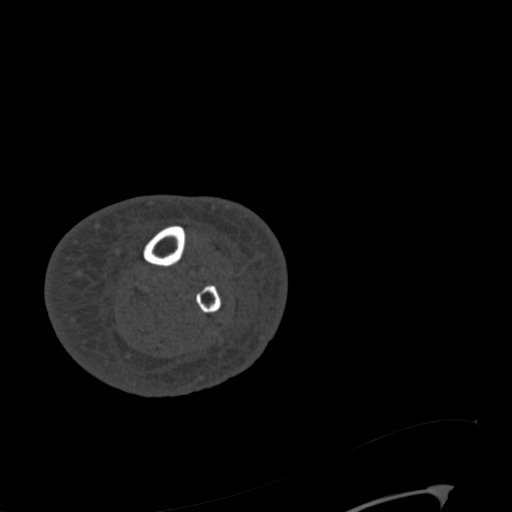
[im 146/316  soft-tissue]
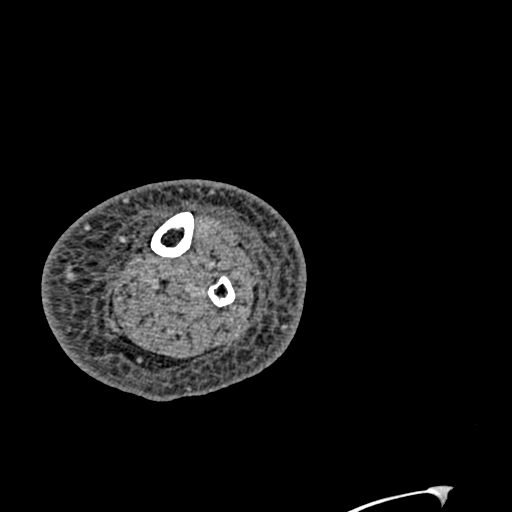
[im 146/316  bone]
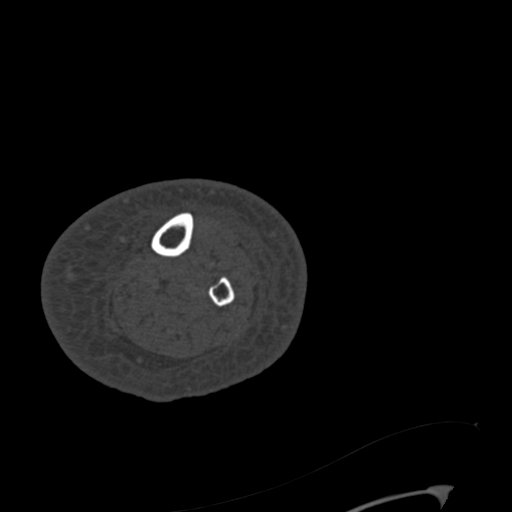
[im 170/316  bone]
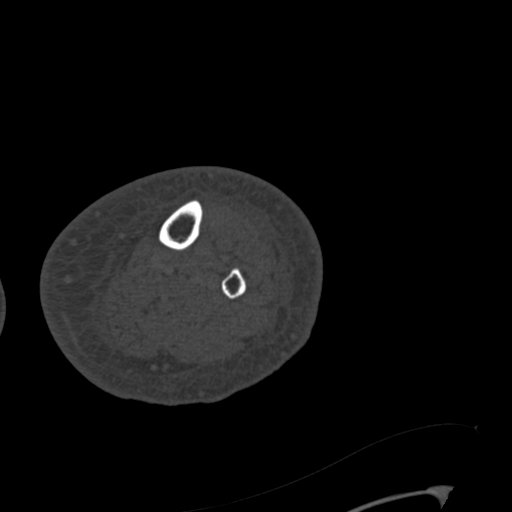
[im 194/316  bone]
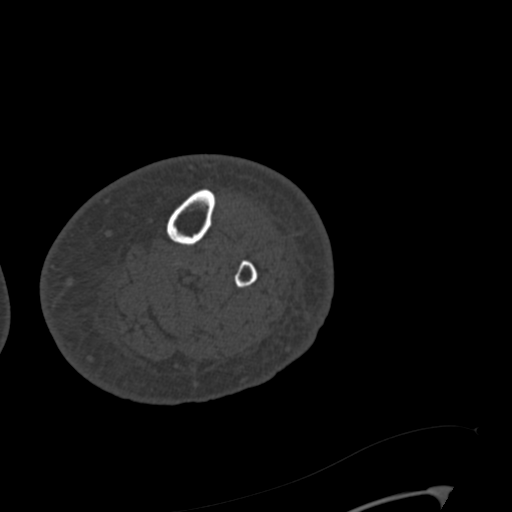
[im 243/316  bone]
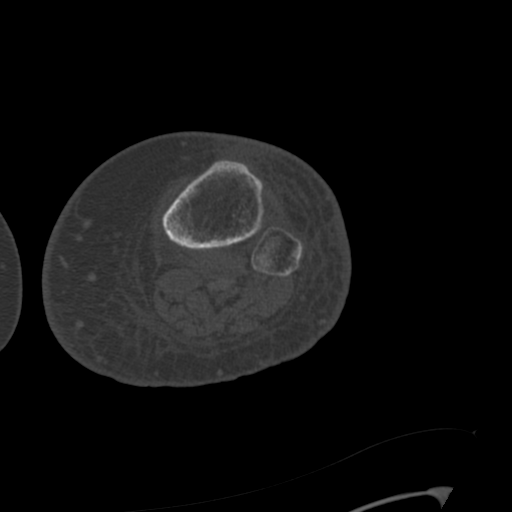
[im 267/316  soft-tissue]
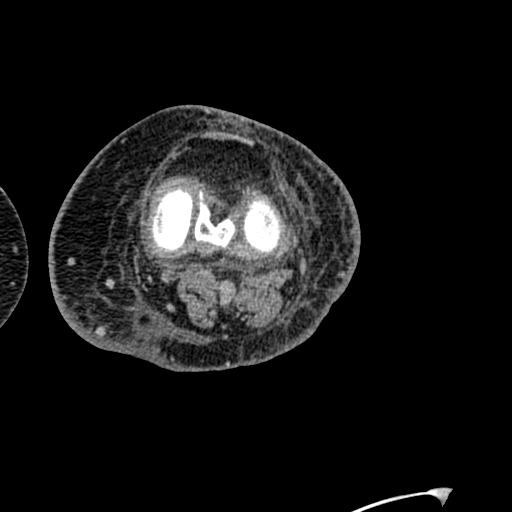
[im 267/316  bone]
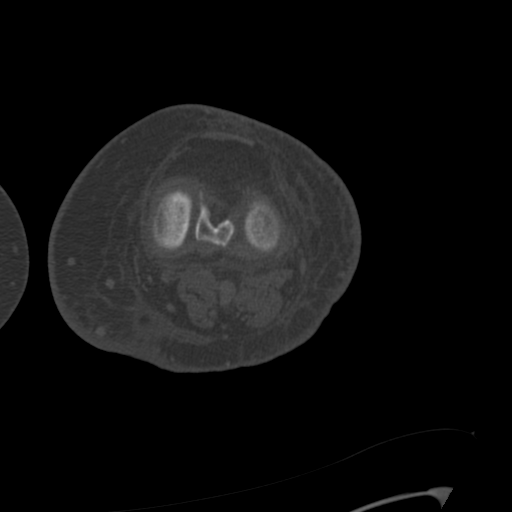
[im 291/316  bone]
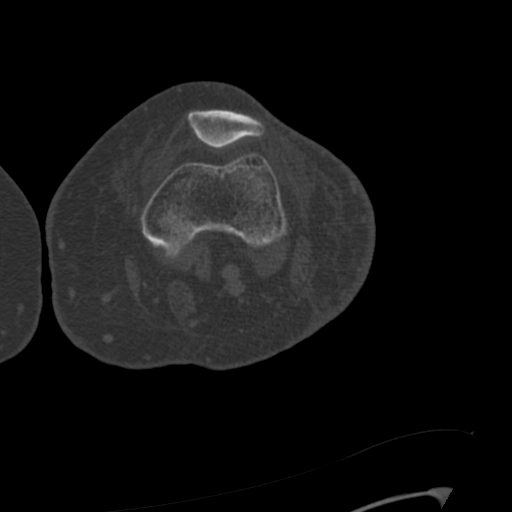

[Series 602: coronal st left tib/fib · coronal · 0.93mm/px · 1 of 81 slices shown]
[im 41/81  bone]
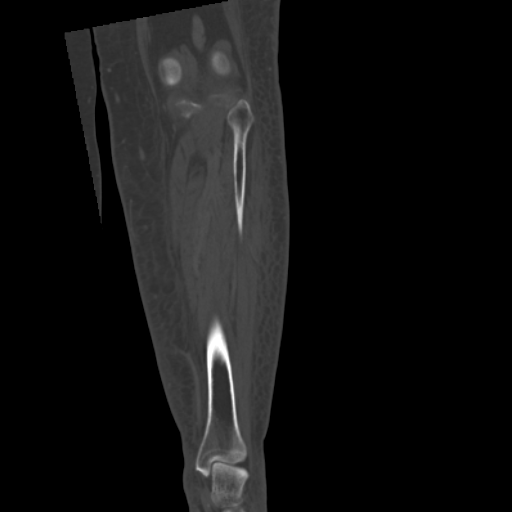

[Series 605: sagittal bone left tib/fib · sagittal · 0.93mm/px · 5 of 82 slices shown]
[im 5/82  bone]
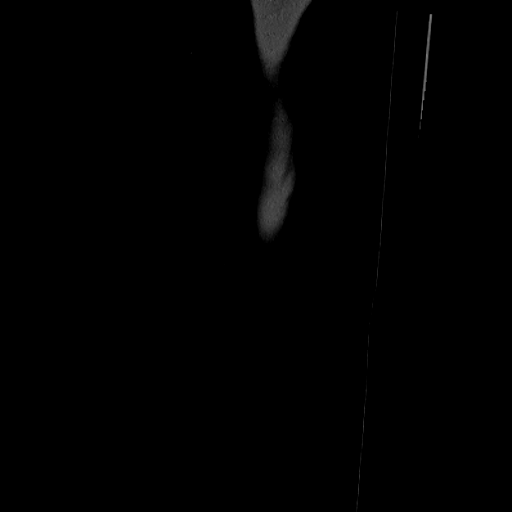
[im 17/82  bone]
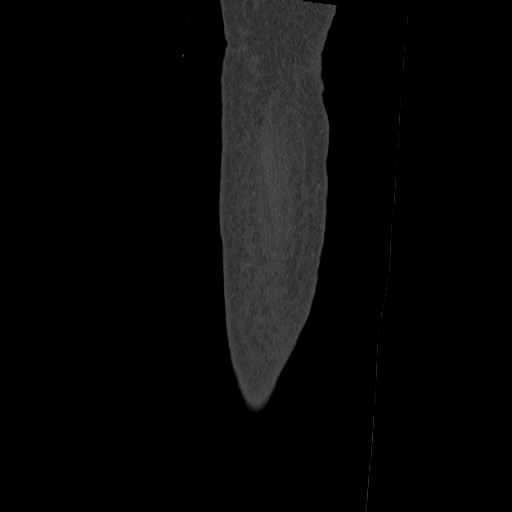
[im 30/82  bone]
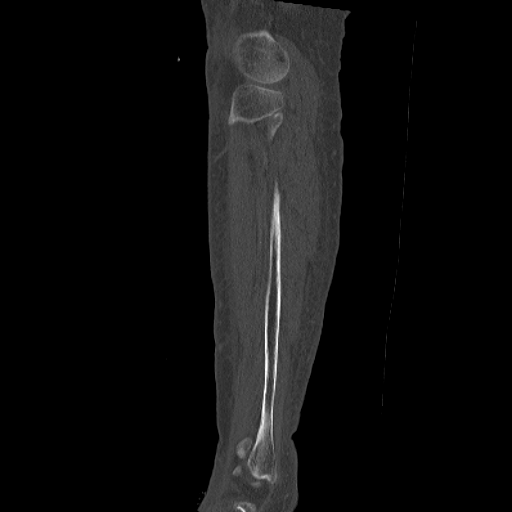
[im 43/82  bone]
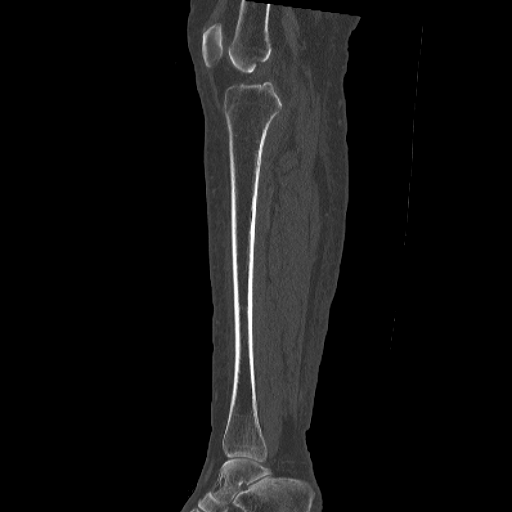
[im 56/82  bone]
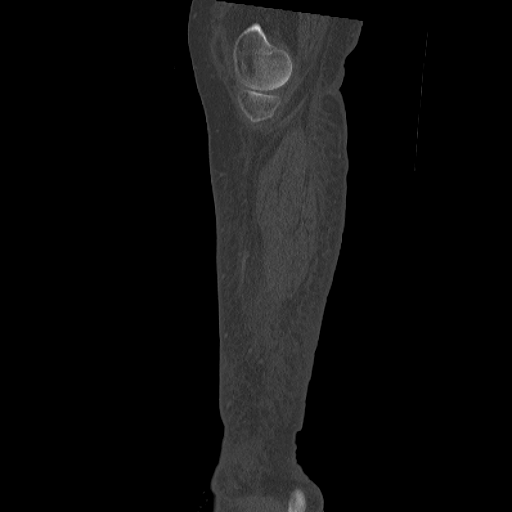

[16 of 34 positions shown; findings below may reference images not displayed]

FINDINGS: Bones/Joint/Cartilage

The bones of the knee and the tibia and fibula and bones of the
ankle joint appear normal. Minimal right knee effusion. No ankle
effusion.

Muscles and Tendons

Normal.

Soft tissues

There circumferential subcutaneous edema throughout the lower leg,
nonspecific. This could represent cellulitis. No definable
abscesses.
IMPRESSION: 1. Circumferential subcutaneous edema throughout the lower leg,
nonspecific. This could represent cellulitis.
2. No definable abscesses.
3. Minimal right knee effusion.

## 2021-03-16 ENCOUNTER — Ambulatory Visit (INDEPENDENT_AMBULATORY_CARE_PROVIDER_SITE_OTHER): Payer: Medicare PPO | Admitting: Orthopedic Surgery

## 2021-03-16 DIAGNOSIS — Z96612 Presence of left artificial shoulder joint: Secondary | ICD-10-CM

## 2021-03-16 MED ORDER — TRAMADOL HCL 50 MG PO TABS
50.0000 mg | ORAL_TABLET | Freq: Four times a day (QID) | ORAL | 0 refills | Status: DC | PRN
Start: 2021-03-16 — End: 2021-03-30

## 2021-03-17 ENCOUNTER — Encounter: Payer: Self-pay | Admitting: Orthopedic Surgery

## 2021-03-17 NOTE — Progress Notes (Signed)
Post-Op Visit Note   Patient: [name]           Date of Birth: 1951-06-29           MRN: [id number] Visit Date: 03/16/2021 PCP: Maurice Small, MD   Assessment & Plan:  Chief Complaint:  Chief Complaint  Patient presents with  . 02/02/21 Left RSA    02/02/21 Left RSA   Visit Diagnoses:  1. History of arthroplasty of left shoulder     Plan: Patient is a 70 year old female presents s/p left reverse shoulder arthroplasty on 02/02/2021.  She had this surgery done for left proximal humerus fracture nonunion.  She feels that she is making slow but steady improvement with her left shoulder.  She is using her CPM machine 3 times per day and is she is up to 60 degrees.  She is still using her sling.  She had a small amount of callus formation on last set of radiographs 2 weeks ago.  She does report that she is able to take a shower alone now and able to get herself dressed aside from pulling up the back of her pants.  She has not started physical therapy yet.  She occasionally wakes with pain at night but this is also improving.  She has been taking oxycodone for pain control but feels this is too strong now.  She would like to try tramadol with occasional Advil as well.    On exam she has 30 degrees external rotation, 50 degrees abduction, 80 degrees forward flexion passively.  Her deltoid is firing.  Incision is healed well with no evidence of infection or dehiscence.  Plan to progress CPM machine from 60 degrees to 75 degrees and then to 90 degrees.  She will do this at her own pace.  Recommended she not go past 90 degrees until her next appointment.  We will also start her in physical therapy upstairs to work on passive range of motion of the left shoulder once per week with no forward flexion or abduction above 90 degrees.  She will follow-up in 2 weeks for clinical recheck with new radiographs at that time.  Call the office with any questions in the meantime.  She will discontinue her  sling as well now.  Follow-Up Instructions: No follow-ups on file.   Orders:  No orders of the defined types were placed in this encounter.  Meds ordered this encounter  Medications  . traMADol (ULTRAM) 50 MG tablet    Sig: Take 1 tablet (50 mg total) by mouth every 6 (six) hours as needed.    Dispense:  30 tablet    Refill:  0    Imaging: No results found.  PMFS History: Patient Active Problem List   Diagnosis Date Noted  . Arthritis of left shoulder region   . S/P reverse total shoulder arthroplasty, 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  . Vitamin D deficiency 01/15/2021  . Vitamin B12 deficiency 01/15/2021  . Primary insomnia 01/15/2021  . Morbid obesity ([place]) 01/15/2021  . Moderate recurrent major depression ([place]) 01/15/2021  . Benzodiazepine dependence ([place]) 01/15/2021  . Port-A-Cath in place 07/22/2020  . Left leg cellulitis 04/17/2020  . Cellulitis of left leg 04/17/2020  . Hypokalemia 04/17/2020  . Macrocytic anemia 04/17/2020  . Anxiety 04/17/2020  . Depression 04/17/2020  . Chronic diarrhea 04/17/2020  . Chemotherapy induced diarrhea 04/17/2020  . Malignant neoplasm of upper-inner quadrant of right breast in female, estrogen receptor positive ([place]) 12/26/2019  . Encephalopathy 02/02/2018  .  Hypothyroidism 02/02/2018  . AKI (acute kidney injury) ([place]) 02/02/2018  . Chronic back pain 02/02/2018  . Alcohol abuse 02/02/2018  . Weight gain 02/02/2018  . Benign essential HTN 02/02/2018   Past Medical History:  Diagnosis Date  . Anxiety   . Cancer (HCC)    breast  . Chronic pain   . Concussion 3/08   ICU x 3 days  . Depression   . Hypertension   . Hypothyroidism   . Spinal stenosis   . Thyroid disease ?1994    Family History  Problem Relation Age of Onset  . Thyroid disease Mother   . Dementia Mother   . Diabetes Father   . Stroke Father   . Hypertension Sister   . Diabetes Sister     Past Surgical History:  Procedure Laterality Date  . BREAST LUMPECTOMY WITH  RADIOACTIVE SEED AND SENTINEL LYMPH NODE BIOPSY Right 01/22/2020   Procedure: RIGHT BREAST LUMPECTOMY WITH RADIOACTIVE SEED X2 AND RIGHT SENTINEL LYMPH NODE MAPPING;  Surgeon: Erroll Luna, MD;  Location: [place];  Service: General;  Laterality: Right;  . BREAST SURGERY Right 4/99   breast biopsy, benign  . EYE SURGERY Right   . PORTACATH PLACEMENT Right 01/22/2020   Procedure: INSERTION PORT-A-CATH WITH ULTRASOUND;  Surgeon: Erroll Luna, MD;  Location: [place];  Service: General;  Laterality: Right;  . PORTACATH PLACEMENT Right 02/28/2020   Procedure: PORT A CATH REVISION;  Surgeon: Erroll Luna, MD;  Location: [place];  Service: General;  Laterality: Right;  . REVERSE SHOULDER ARTHROPLASTY Left 02/02/2021   Procedure: LEFT REVERSE SHOULDER ARTHROPLASTY;  Surgeon: Meredith Pel, MD;  Location: [place];  Service: Orthopedics;  Laterality: Left;  . SHOULDER SURGERY Right   . SPINAL FUSION  03/04/11   with ORIF   Social History   Occupational History  . 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 Smoking status: Former Smoker    Years: 20.00    Types: Cigarettes  . Smokeless tobacco: Never Used  Vaping Use  . Vaping Use: Never used  Substance and Sexual Activity  . Alcohol use: Yes    Alcohol/week: 5.0 standard drinks    Types: 5 Standard drinks or equivalent per week    Comment: wine  . Drug use: No  . Sexual activity: Not Currently    Partners: Male    Birth control/protection: Post-menopausal

## 2021-03-19 ENCOUNTER — Other Ambulatory Visit (HOSPITAL_COMMUNITY): Payer: Medicare PPO

## 2021-03-22 ENCOUNTER — Encounter: Payer: Self-pay | Admitting: Orthopedic Surgery

## 2021-03-23 ENCOUNTER — Other Ambulatory Visit: Payer: Self-pay

## 2021-03-23 ENCOUNTER — Other Ambulatory Visit: Payer: Self-pay | Admitting: Nurse Practitioner

## 2021-03-23 ENCOUNTER — Telehealth: Payer: Self-pay | Admitting: Nurse Practitioner

## 2021-03-23 DIAGNOSIS — Z96612 Presence of left artificial shoulder joint: Secondary | ICD-10-CM

## 2021-03-26 ENCOUNTER — Other Ambulatory Visit: Payer: Self-pay

## 2021-03-26 ENCOUNTER — Encounter: Payer: Self-pay | Admitting: Physical Therapy

## 2021-03-26 ENCOUNTER — Ambulatory Visit (INDEPENDENT_AMBULATORY_CARE_PROVIDER_SITE_OTHER): Payer: Medicare PPO | Admitting: Physical Therapy

## 2021-03-26 DIAGNOSIS — R296 Repeated falls: Secondary | ICD-10-CM

## 2021-03-26 DIAGNOSIS — M6281 Muscle weakness (generalized): Secondary | ICD-10-CM | POA: Diagnosis not present

## 2021-03-26 DIAGNOSIS — M25512 Pain in left shoulder: Secondary | ICD-10-CM

## 2021-03-26 DIAGNOSIS — R600 Localized edema: Secondary | ICD-10-CM

## 2021-03-26 DIAGNOSIS — R293 Abnormal posture: Secondary | ICD-10-CM | POA: Diagnosis not present

## 2021-03-26 DIAGNOSIS — M25612 Stiffness of left shoulder, not elsewhere classified: Secondary | ICD-10-CM

## 2021-03-26 NOTE — Patient Instructions (Signed)
Access Code: [email or web address] URL: [email or web address] Date: 03/26/2021 Prepared by: Faustino Congress  Exercises Flexion-Extension Shoulder Pendulum with Table Support - 2-3 x daily - 7 x weekly - 1 sets - 10-20 reps Horizontal Shoulder Pendulum with Table Support - 2-3 x daily - 7 x weekly - 1 sets - 10-20 reps Circular Shoulder Pendulum with Table Support - 2-3 x daily - 7 x weekly - 1 sets - 10-20 reps

## 2021-03-26 NOTE — Therapy (Addendum)
[facility] [address] [place], [place], [address] Phone: [phone]   Fax:  [phone]  Physical Therapy Evaluation  Patient Details  Name: [name] MRN: [id number] Date of Birth: Jan 12, 1951 Referring Provider (PT): Donella Stade, [place]   Encounter Date: 03/26/2021   PT End of Session - 03/26/21 1419    Visit Number 1    Number of Visits 24    Date for PT Re-Evaluation 06/18/21    Authorization Type [employer]    PT Start Time 5681    PT Stop Time 1415    PT Time Calculation (min) 30 min    Activity Tolerance Patient tolerated treatment well    Behavior During Therapy [facility] for tasks assessed/performed           Past Medical History:  Diagnosis Date  . Anxiety   . Cancer (HCC)    breast  . Chronic pain   . Concussion 3/08   ICU x 3 days  . Depression   . Hypertension   . Hypothyroidism   . Spinal stenosis   . Thyroid disease ?1994    Past Surgical History:  Procedure Laterality Date  . BREAST LUMPECTOMY WITH RADIOACTIVE SEED AND SENTINEL LYMPH NODE BIOPSY Right 01/22/2020   Procedure: RIGHT BREAST LUMPECTOMY WITH RADIOACTIVE SEED X2 AND RIGHT SENTINEL LYMPH NODE MAPPING;  Surgeon: Erroll Luna, MD;  Location: [place];  Service: General;  Laterality: Right;  . BREAST SURGERY Right 4/99   breast biopsy, benign  . EYE SURGERY Right   . PORTACATH PLACEMENT Right 01/22/2020   Procedure: INSERTION PORT-A-CATH WITH ULTRASOUND;  Surgeon: Erroll Luna, MD;  Location: [place];  Service: General;  Laterality: Right;  . PORTACATH PLACEMENT Right 02/28/2020   Procedure: PORT A CATH REVISION;  Surgeon: Erroll Luna, MD;  Location: [place];  Service: General;  Laterality: Right;  . REVERSE SHOULDER ARTHROPLASTY Left 02/02/2021   Procedure: LEFT REVERSE SHOULDER ARTHROPLASTY;  Surgeon: Meredith Pel, MD;  Location: [place];  Service: Orthopedics;  Laterality: Left;  . SHOULDER SURGERY Right    . SPINAL FUSION  03/04/11   with ORIF    There were no vitals filed for this visit.    Subjective Assessment - 03/26/21 1346    Subjective Pt is a 70 yo female with L shoulder proximal humerus fracture with nonunion, s/p L reverse shoulder arthroplasty on 2/14.  Initial injury due to fall upon rising from chair without known cause.    Pertinent History anxiety, breast cancer with lumpectomy and radioactive seed placement 2021 and recent port removal, chronic pain, concussion 2008, HTN, depression, spinal fusion 2012.    Patient Stated Goals improve function of Lt arm, raise shoulder over her head to do her hair    Currently in Pain? Yes    Pain Score 4    up to 7/10; at best 3-4/10   Pain Location Shoulder    Pain Orientation Left    Pain Descriptors / Indicators Aching;Sore    Pain Type Chronic pain;Surgical pain    Pain Onset More than a month ago    Pain Frequency Constant    Aggravating Factors  moving it around    Pain Relieving Factors ibuprofen, tramadol              [facility] PT Assessment - 03/26/21 1352      Assessment   Medical Diagnosis Z96.612 (ICD-10-CM) - History of arthroplasty of left shoulder    Referring Provider (  PT) Magnant, Gerrianne Scale, PA-C    Onset Date/Surgical Date 02/02/21    Hand Dominance Right    Next MD Visit 03/30/21    Prior Therapy none      Precautions   Precautions Fall;Shoulder    Precaution Comments PROM only, no greater than 90 deg flex/abdct      Restrictions   Weight Bearing Restrictions No      Balance Screen   Has the patient fallen in the past 6 months Yes    How many times? 1    Has the patient had a decrease in activity level because of a fear of falling?  Yes    Is the patient reluctant to leave their home because of a fear of falling?  Yes      [place] Private residence    Living Arrangements Spouse/significant other    Available Help at Discharge Family    Type of [place] to enter    Entrance Stairs-Number of Steps [place] One level    Additional Comments I with bathing, toileting; min A with dressing and unable to do hair at this time      Prior Function   Level of Independence Independent    Vocation Retired    [employer] retired Oncologist      Observation/Other Assessments   Focus on Therapeutic Outcomes ([place])  44 (predicted 45)      Posture/Postural Control   Posture/Postural Control Postural limitations    Postural Limitations Rounded Shoulders;Forward head      ROM / Strength   AROM / PROM / Strength PROM      AROM   Right/Left Shoulder Right    Right Shoulder Flexion 140 Degrees    Right Shoulder ABduction 133 Degrees      PROM   PROM Assessment Site Shoulder    Right/Left Shoulder Left    Left Shoulder Flexion 90 Degrees    Left Shoulder ABduction 90 Degrees    Left Shoulder Internal Rotation 65 Degrees    Left Shoulder External Rotation 18 Degrees      Strength   Overall Strength Comments deferred today due to PROM only                      Objective measurements completed on examination: See above findings.               PT Education - 03/26/21 1419    Education Details HEP    Person(s) Educated Patient    Methods Explanation;Demonstration;Handout    Comprehension Verbalized understanding;Returned demonstration;Need further instruction            PT Short Term Goals - 03/26/21 1422      PT SHORT TERM GOAL #1   Title independent with intial HEP    Status New    Target Date 04/23/21      PT SHORT TERM GOAL #2   Title improve Lt shoulder passive ER to 30 deg    Status New    Target Date 04/23/21             PT Long Term Goals - 03/26/21 1423      PT LONG TERM GOAL #1   Title independet with final HEP    Time 12    Period Weeks    Status New  Target Date 06/18/21      PT LONG TERM GOAL #2   Title Lt shoulder  active flexion and abduction improved to at least 90 deg in order to perform ADLs    Time 12    Period Weeks    Status New    Target Date 06/18/21      PT LONG TERM GOAL #3   Title report pain < 3/10 with activity for improved function    Time 12    Period Weeks    Status New    Target Date 06/18/21      PT LONG TERM GOAL #4   Title FOTO score improved to 61 for improved function    Time 12    Period Weeks    Status New    Target Date 06/18/21      PT LONG TERM GOAL #5   Title demonstrate at least 3/5 Lt shoulder flex/abduction strength for improved function    Time 12    Period Weeks    Status New    Target Date 06/18/21                  Plan - 03/26/21 1419    Clinical Impression Statement Pt is a 70 yo female with L shoulder proximal humerus fracture with nonunion, s/p L reverse shoulder arthroplasty on 2/14.  She demonstrates decreased ROM and strength as well as postural abnormalities affecting functional mobility.  Will benefit from PT to address deficits listed.  Overall flexion and abduction at limits at this time with minimal pain at end range.    Personal Factors and Comorbidities Comorbidity 3+    Comorbidities anxiety, breast cancer with lumpectomy and radioactive seed placement 2021 and recent port removal, chronic pain, concussion 2008, HTN, depression, spinal fusion 2012    Examination-Activity Limitations Locomotion Level;Reach Overhead;Dressing;Hygiene/Grooming;Lift;Toileting;Bathing   Was unable to get onto examine table   Examination-Participation Restrictions Cleaning;Meal Prep;Community Activity;Driving;Laundry    Stability/Clinical Decision Making Evolving/Moderate complexity    Clinical Decision Making Moderate    Rehab Potential Good    PT Frequency 2x / week   Eval and 1 f/u visit   PT Duration 12 weeks    PT Treatment/Interventions ADLs/Self Care Home Management;Therapeutic exercise;Patient/family education;Cryotherapy;DME  Instruction;Ultrasound;Moist Heat;Functional mobility training;Therapeutic activities;Balance training;Neuromuscular re-education;Manual techniques;Vasopneumatic Device;Taping;Dry needling;Passive range of motion    PT Next Visit Plan review pendulums, scap retraction, see if MD has allowed increased ROM and progress as able    PT Home Exercise Plan Access Code: [email or web address]    Consulted and Agree with Plan of Care Patient           Patient will benefit from skilled therapeutic intervention in order to improve the following deficits and impairments:  Postural dysfunction,Decreased range of motion,Decreased knowledge of precautions,Increased edema,Impaired UE functional use,Decreased strength,Decreased mobility,Decreased balance,Pain  Visit Diagnosis: Acute pain of left shoulder - Plan: PT plan of care cert/re-cert  Stiffness of left shoulder, not elsewhere classified - Plan: PT plan of care cert/re-cert  Abnormal posture - Plan: PT plan of care cert/re-cert  Repeated falls - Plan: PT plan of care cert/re-cert  Muscle weakness (generalized) - Plan: PT plan of care cert/re-cert  Localized edema - Plan: PT plan of care cert/re-cert     Problem List Patient Active Problem List   Diagnosis Date Noted  . Arthritis of left shoulder region   . S/P reverse total shoulder arthroplasty, 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  . Vitamin D deficiency 01/15/2021  . Vitamin B12 deficiency 01/15/2021  .  Primary insomnia 01/15/2021  . Morbid obesity ([place]) 01/15/2021  . Moderate recurrent major depression ([place]) 01/15/2021  . Benzodiazepine dependence ([place]) 01/15/2021  . Port-A-Cath in place 07/22/2020  . Left leg cellulitis 04/17/2020  . Cellulitis of left leg 04/17/2020  . Hypokalemia 04/17/2020  . Macrocytic anemia 04/17/2020  . Anxiety 04/17/2020  . Depression 04/17/2020  . Chronic diarrhea 04/17/2020  . Chemotherapy induced diarrhea 04/17/2020  . Malignant neoplasm of upper-inner quadrant of right breast in  female, estrogen receptor positive ([place]) 12/26/2019  . Encephalopathy 02/02/2018  . Hypothyroidism 02/02/2018  . AKI (acute kidney injury) ([place]) 02/02/2018  . Chronic back pain 02/02/2018  . Alcohol abuse 02/02/2018  . Weight gain 02/02/2018  . Benign essential HTN 02/02/2018      Laureen Abrahams, PT, DPT 03/26/21 2:28 PM     [facility] [address] [place], [place], [address] Phone: [phone]   Fax:  [phone]  Name: [name] MRN: [id number] Date of Birth: [birth year]      Referring diagnosis? J00.938 Treatment diagnosis? (if different than referring diagnosis) M25.512, M25.612, R29.3, R29.6, M62.81, R60.0 What was this (referring dx) caused by? [x]  Surgery [x]  Fall []  Ongoing issue []  Arthritis []  Other: ____________  Laterality: []  Rt [x]  Lt []  Both  Check all possible CPT codes:      []  97110 (Therapeutic Exercise)  []  92507 (SLP Treatment)  []  18299 (Neuro Re-ed)   []  92526 (Swallowing Treatment)   []  97116 (Gait Training)   []  37169 (Cognitive Training, 1st 15 minutes) []  97140 (Manual Therapy)   []  97130 (Cognitive Training, each add'l 15 minutes)  []  67893 (Therapeutic Activities)  []  Other, List CPT Code ____________    []  81017 (Self Care)       [x]  All codes above (97110 - 97535)  []  51025 (Mechanical Traction)  [x]  97014 (E-stim Unattended)  []  97032 (E-stim manual)  [x]  97033 (Ionto)  [x]  97035 (Ultrasound)  []  97760 (Orthotic Fit) [x]  97750 (Physical Performance Training) []  [id number] (Aquatic Therapy) []  97034 (Contrast Bath) []  85277 (Paraffin) []  97597 (Wound Care 1st 20 sq cm) []  97598 (Wound Care each add'l 20 sq cm) [x]  97016 (Vasopneumatic Device) []  [id number] Comptroller) []  [id number] (Prosthetic Training)

## 2021-03-30 ENCOUNTER — Ambulatory Visit (INDEPENDENT_AMBULATORY_CARE_PROVIDER_SITE_OTHER): Payer: Medicare PPO | Admitting: Orthopedic Surgery

## 2021-03-30 ENCOUNTER — Ambulatory Visit (INDEPENDENT_AMBULATORY_CARE_PROVIDER_SITE_OTHER): Payer: Medicare PPO

## 2021-03-30 DIAGNOSIS — Z96612 Presence of left artificial shoulder joint: Secondary | ICD-10-CM

## 2021-03-30 MED ORDER — TRAMADOL HCL 50 MG PO TABS: 50.0000 mg | ORAL_TABLET | Freq: Three times a day (TID) | ORAL | 0 refills | Status: DC | PRN

## 2021-04-02 ENCOUNTER — Encounter: Payer: Self-pay | Admitting: Orthopedic Surgery

## 2021-04-02 NOTE — Progress Notes (Signed)
Post-Op Visit Note   Patient: [name]           Date of Birth: December 01, 1951           MRN: [id number] Visit Date: 03/30/2021 PCP: Maurice Small, MD   Assessment & Plan:  Chief Complaint:  Chief Complaint  Patient presents with  . Other     Shoulder post op visit    Visit Diagnoses:  1. History of arthroplasty of left shoulder     Plan: [name] is a 70 year old patient with left reverse shoulder replacement placed 2 months ago.  Mobility is improving but she still has pain.  She 75 degrees on the CPM doing 3 times a day at 90.  Taking Ultram with some relief.  On exam deltoid is functional.  Incision is intact.  Passive range of motion is improving.  At this time I want her to continue with the CPM machine.  Refill Ultram.  Come back in a month.  Radiographs today show no change in position of the components.  She did have very osteoporotic bone on both the humeral and glenoid side.  I would check some radiographs in another month as well just to look for component change in position.  Manually the patient has nonconcerning passive range of motion.  She is regaining her motion a little slower than usual.  Follow-Up Instructions: No follow-ups on file.   Orders:  Orders Placed This Encounter  Procedures  . XR Shoulder Left   Meds ordered this encounter  Medications  . traMADol (ULTRAM) 50 MG tablet    Sig: Take 1 tablet (50 mg total) by mouth every 8 (eight) hours as needed.    Dispense:  60 tablet    Refill:  0    Imaging: No results found.  PMFS History: Patient Active Problem List   Diagnosis Date Noted  . Arthritis of left shoulder region   . S/P reverse total shoulder arthroplasty, 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  . Vitamin D deficiency 01/15/2021  . Vitamin B12 deficiency 01/15/2021  . Primary insomnia 01/15/2021  . Morbid obesity ([place]) 01/15/2021  . Moderate recurrent major depression ([place]) 01/15/2021  . Benzodiazepine dependence ([place]) 01/15/2021  . Port-A-Cath in  place 07/22/2020  . Left leg cellulitis 04/17/2020  . Cellulitis of left leg 04/17/2020  . Hypokalemia 04/17/2020  . Macrocytic anemia 04/17/2020  . Anxiety 04/17/2020  . Depression 04/17/2020  . Chronic diarrhea 04/17/2020  . Chemotherapy induced diarrhea 04/17/2020  . Malignant neoplasm of upper-inner quadrant of right breast in female, estrogen receptor positive ([place]) 12/26/2019  . Encephalopathy 02/02/2018  . Hypothyroidism 02/02/2018  . AKI (acute kidney injury) ([place]) 02/02/2018  . Chronic back pain 02/02/2018  . Alcohol abuse 02/02/2018  . Weight gain 02/02/2018  . Benign essential HTN 02/02/2018   Past Medical History:  Diagnosis Date  . Anxiety   . Cancer (HCC)    breast  . Chronic pain   . Concussion 3/08   ICU x 3 days  . Depression   . Hypertension   . Hypothyroidism   . Spinal stenosis   . Thyroid disease ?1994    Family History  Problem Relation Age of Onset  . Thyroid disease Mother   . Dementia Mother   . Diabetes Father   . Stroke Father   . Hypertension Sister   . Diabetes Sister     Past Surgical History:  Procedure Laterality Date  . BREAST LUMPECTOMY WITH RADIOACTIVE SEED AND SENTINEL LYMPH NODE BIOPSY Right  01/22/2020   Procedure: RIGHT BREAST LUMPECTOMY WITH RADIOACTIVE SEED X2 AND RIGHT SENTINEL LYMPH NODE MAPPING;  Surgeon: Erroll Luna, MD;  Location: [place];  Service: General;  Laterality: Right;  . BREAST SURGERY Right 4/99   breast biopsy, benign  . EYE SURGERY Right   . PORTACATH PLACEMENT Right 01/22/2020   Procedure: INSERTION PORT-A-CATH WITH ULTRASOUND;  Surgeon: Erroll Luna, MD;  Location: [place];  Service: General;  Laterality: Right;  . PORTACATH PLACEMENT Right 02/28/2020   Procedure: PORT A CATH REVISION;  Surgeon: Erroll Luna, MD;  Location: [place];  Service: General;  Laterality: Right;  . REVERSE SHOULDER ARTHROPLASTY Left 02/02/2021   Procedure: LEFT REVERSE SHOULDER ARTHROPLASTY;   Surgeon: Meredith Pel, MD;  Location: [place];  Service: Orthopedics;  Laterality: Left;  . SHOULDER SURGERY Right   . SPINAL FUSION  03/04/11   with ORIF   Social History   Occupational History  . 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 Smoking status: Former Smoker    Years: 20.00    Types: Cigarettes  . Smokeless tobacco: Never Used  Vaping Use  . Vaping Use: Never used  Substance and Sexual Activity  . Alcohol use: Yes    Alcohol/week: 5.0 standard drinks    Types: 5 Standard drinks or equivalent per week    Comment: wine  . Drug use: No  . Sexual activity: Not Currently    Partners: Male    Birth control/protection: Post-menopausal

## 2021-04-08 ENCOUNTER — Ambulatory Visit (HOSPITAL_COMMUNITY)
Admission: RE | Admit: 2021-04-08 | Discharge: 2021-04-08 | Disposition: A | Discharge status: Home / Self Care | Payer: Medicare PPO | Source: Ambulatory Visit | Attending: Hematology and Oncology | Admitting: Hematology and Oncology

## 2021-04-08 ENCOUNTER — Other Ambulatory Visit: Payer: Self-pay

## 2021-04-08 DIAGNOSIS — C50211 Malignant neoplasm of upper-inner quadrant of right female breast: Secondary | ICD-10-CM | POA: Diagnosis not present

## 2021-04-08 DIAGNOSIS — K8080 Other cholelithiasis without obstruction: Secondary | ICD-10-CM | POA: Diagnosis not present

## 2021-04-08 DIAGNOSIS — D259 Leiomyoma of uterus, unspecified: Secondary | ICD-10-CM | POA: Diagnosis not present

## 2021-04-08 DIAGNOSIS — J984 Other disorders of lung: Secondary | ICD-10-CM | POA: Diagnosis not present

## 2021-04-08 DIAGNOSIS — C50911 Malignant neoplasm of unspecified site of right female breast: Secondary | ICD-10-CM | POA: Diagnosis not present

## 2021-04-08 DIAGNOSIS — Z170 Estrogen receptor positive status [ER+]: Secondary | ICD-10-CM | POA: Diagnosis not present

## 2021-04-08 DIAGNOSIS — K8020 Calculus of gallbladder without cholecystitis without obstruction: Secondary | ICD-10-CM | POA: Diagnosis not present

## 2021-04-08 DIAGNOSIS — I2510 Atherosclerotic heart disease of native coronary artery without angina pectoris: Secondary | ICD-10-CM | POA: Diagnosis not present

## 2021-04-08 DIAGNOSIS — E278 Other specified disorders of adrenal gland: Secondary | ICD-10-CM | POA: Diagnosis not present

## 2021-04-08 LAB — POCT I-STAT CREATININE: Creatinine, Ser: 0.6 mg/dL (ref 0.44–1.00)

## 2021-04-08 MED ORDER — IOHEXOL 300 MG/ML  SOLN
100.0000 mL | Freq: Once | INTRAMUSCULAR | Status: AC | PRN
  Administered 2021-04-08: 100 mL via INTRAVENOUS

## 2021-04-09 ENCOUNTER — Telehealth: Payer: Self-pay | Admitting: Orthopedic Surgery

## 2021-04-09 NOTE — Telephone Encounter (Signed)
I talked with patient. There was miscommunication about scheduling. [name] will get her scheduled for next week and schedule her for several weeks out.

## 2021-04-09 NOTE — Telephone Encounter (Signed)
Patient called requesting a call back from Rogelia Boga. Pt states Dr. Marlou Sa stated to send her to a different physical therapy facility and haven't received a call concerning setting appt. Patient states she can barely move her arm and need to stop therapy. Please call patient concerning this matter at [phone].

## 2021-04-09 NOTE — Telephone Encounter (Signed)
410 I am not sure what the note is asking.  I am happy to send her to a different physical therapy other than where she is going.  If she can find out where she is going and then if its not here we will send her here if it is here we will send her to [facility] physical therapy thanks

## 2021-04-09 NOTE — Telephone Encounter (Signed)
Please advise. Do you recall about this?

## 2021-04-13 ENCOUNTER — Inpatient Hospital Stay: Payer: Medicare PPO | Attending: Hematology and Oncology | Admitting: Hematology and Oncology

## 2021-04-13 ENCOUNTER — Telehealth: Payer: Self-pay | Admitting: Hematology and Oncology

## 2021-04-13 ENCOUNTER — Other Ambulatory Visit: Payer: Self-pay

## 2021-04-13 DIAGNOSIS — Z170 Estrogen receptor positive status [ER+]: Secondary | ICD-10-CM | POA: Diagnosis not present

## 2021-04-13 DIAGNOSIS — E876 Hypokalemia: Secondary | ICD-10-CM | POA: Diagnosis not present

## 2021-04-13 DIAGNOSIS — Z9221 Personal history of antineoplastic chemotherapy: Secondary | ICD-10-CM | POA: Insufficient documentation

## 2021-04-13 DIAGNOSIS — C50211 Malignant neoplasm of upper-inner quadrant of right female breast: Secondary | ICD-10-CM | POA: Diagnosis not present

## 2021-04-13 DIAGNOSIS — Z923 Personal history of irradiation: Secondary | ICD-10-CM | POA: Insufficient documentation

## 2021-04-13 DIAGNOSIS — E279 Disorder of adrenal gland, unspecified: Secondary | ICD-10-CM | POA: Insufficient documentation

## 2021-04-13 DIAGNOSIS — F329 Major depressive disorder, single episode, unspecified: Secondary | ICD-10-CM | POA: Insufficient documentation

## 2021-04-13 DIAGNOSIS — G4700 Insomnia, unspecified: Secondary | ICD-10-CM | POA: Diagnosis not present

## 2021-04-13 MED ORDER — ANASTROZOLE 1 MG PO TABS: 1.0000 mg | ORAL_TABLET | Freq: Every day | ORAL | 3 refills | Status: DC

## 2021-04-13 NOTE — Progress Notes (Signed)
Patient Care Team: Maurice Small, MD as PCP - General (Family Medicine) Rockwell Germany, RN as Oncology Nurse Navigator Tressie Ellis, Paulette Blanch, RN as Oncology Nurse Navigator Erroll Luna, MD as Consulting Physician (General Surgery) Nicholas Lose, MD as Consulting Physician (Hematology and Oncology) Gery Pray, MD as Consulting Physician (Radiation Oncology)  DIAGNOSIS:    ICD-10-CM   1. Malignant neoplasm of upper-inner quadrant of right breast in female, estrogen receptor positive ([place])  C50.211    Z17.0     SUMMARY OF ONCOLOGIC HISTORY: Oncology History  Malignant neoplasm of upper-inner quadrant of right breast in female, estrogen receptor positive ([place])  12/26/2019 Initial Diagnosis   Patient palpated a right breast lump x1wk. Mammogram and US showed two adjacent masses at the 2 o'clock position measuring 1.4cm and 0.6cm, calcifications in the outer right breast at the 9 o'clock position, no right axillary adenopathy. Biopsy showed IDC at the 2 o'clock position, grade 3, HER-2 + (3+), ER+ 90%, PR+ 30%, Ki67 30%, and DCIS in the upper outer right breast, high grade, ER+ 95%, PR 90%.   01/22/2020 Surgery   Right breast lumpectomy x2 ([place]): Medial position: IDC, grade 3, 2.3cm, with high grade DCIS, clear margins Lateral position: high grade DCIS, clear margins, 4 right axillary lymph nodes negative    01/22/2020 Cancer Staging   Staging form: Breast, AJCC 8th Edition - Pathologic stage from 01/22/2020: Stage IA (pT2, pN0, cM0, G3, ER+, PR+, HER2+) - Signed by Gardenia Phlegm, NP on 02/06/2020   02/19/2020 -  Chemotherapy    Patient is on Treatment Plan: BREAST WEEKLY PACLITAXEL / TRASTUZUMAB / MAINTENANCE TRASTUZUMAB EVERY 21 DAYS      05/29/2020 - 06/25/2020 Radiation Therapy   Adjuvant radiation     CHIEF COMPLIANT: Follow-up to discuss antiestrogen therapy  INTERVAL HISTORY: [name] is a 70 y.o. with above-mentioned history of right breast  cancerwhounderwent a right breast lumpectomy x2,adjuvant chemotherapy,radiation,and completed Herceptinmaintenance.CT abdomen/pelvis on 04/08/21 showed no evidence of new or progressive disease. She presents to the clinic todayfortreatment. She tells me that her energy levels are coming back.  She is very concerned that her hair is not growing well.  She continues to have emotional problems and suffers from major depression.  However overall she is doing significantly better.  She has not done her mammogram because she has great difficulty lifting her arm above the shoulder level on the left side and therefore she thought she cannot get a mammogram as result of that.  ALLERGIES:  is allergic to [employer] tartrate].  MEDICATIONS:  Current Outpatient Medications  Medication Sig Dispense Refill  . anastrozole (ARIMIDEX) 1 MG tablet Take 1 tablet (1 mg total) by mouth daily. 90 tablet 3  . amitriptyline (ELAVIL) 25 MG tablet Take 75 mg by mouth at bedtime.     Marland Kitchen amLODipine (NORVASC) 5 MG tablet Take 7.5 mg by mouth daily in the afternoon.    Marland Kitchen apixaban (ELIQUIS) 5 MG TABS tablet Take 1 tablet (5 mg total) by mouth 2 (two) times daily. 60 tablet 2  . celecoxib (CELEBREX) 100 MG capsule Take 1 capsule (100 mg total) by mouth 2 (two) times daily. 60 capsule 0  . diazepam (VALIUM) 10 MG tablet 1 tablet as needed    . furosemide (LASIX) 20 MG tablet Take 20 mg by mouth daily. For HTN    . levothyroxine (SYNTHROID) 150 MCG tablet Take 150 mcg by mouth daily in the afternoon.    . lidocaine-prilocaine (EMLA)  cream Apply to affected area once (Patient not taking: Reported on 03/26/2021) 30 g 3  . loperamide (IMODIUM A-D) 2 MG tablet Take 2-4 mg by mouth 4 (four) times daily as needed for diarrhea or loose stools.    . methocarbamol (ROBAXIN) 500 MG tablet Take 1 tablet (500 mg total) by mouth every 8 (eight) hours as needed for muscle spasms. (Patient not taking: Reported on 03/26/2021) 30 tablet 0   . metoprolol tartrate (LOPRESSOR) 25 MG tablet Take 1 tablet (25 mg total) by mouth 2 (two) times daily. 180 tablet 3  . saccharomyces boulardii (FLORASTOR) 250 MG capsule Take 1 capsule (250 mg total) by mouth 2 (two) times daily. (Patient not taking: Reported on 03/26/2021) 60 capsule 6  . traMADol (ULTRAM) 50 MG tablet Take 1 tablet (50 mg total) by mouth every 8 (eight) hours as needed. 60 tablet 0  . venlafaxine XR (EFFEXOR-XR) 150 MG 24 hr capsule Take 150 mg by mouth daily in the afternoon.    . vitamin B-12 (CYANOCOBALAMIN) 1000 MCG tablet Take 1,000 mcg by mouth daily in the afternoon.     No current facility-administered medications for this visit.    PHYSICAL EXAMINATION: ECOG PERFORMANCE STATUS: 1 - Symptomatic but completely ambulatory  Vitals:   04/13/21 1422  BP: (!) 153/75  Pulse: 91  Resp: 18  Temp: 97.6 F (36.4 C)  SpO2: 97%   Filed Weights     LABORATORY DATA:  I have reviewed the data as listed CMP Latest Ref Rng & Units 04/08/2021 01/27/2021 01/06/2021  Glucose 70 - 99 mg/dL - 100(H) 101(H)  BUN 8 - 23 mg/dL - 18 18  Creatinine 0.44 - 1.00 mg/dL 0.60 0.81 0.73  Sodium 135 - 145 mmol/L - 138 139  Potassium 3.5 - 5.1 mmol/L - 3.9 3.9  Chloride 98 - 111 mmol/L - 100 103  CO2 22 - 32 mmol/L - 26 27  Calcium 8.9 - 10.3 mg/dL - 8.8(L) 8.8(L)  Total Protein 6.5 - 8.1 g/dL - - 7.0  Total Bilirubin 0.3 - 1.2 mg/dL - - 0.6  Alkaline Phos 38 - 126 U/L - - 92  AST 15 - 41 U/L - - 15  ALT 0 - 44 U/L - - 21    Lab Results  Component Value Date   WBC 9.4 02/04/2021   HGB 10.0 (L) 02/04/2021   HCT 31.0 (L) 02/04/2021   MCV 105.8 (H) 02/04/2021   PLT 219 02/04/2021   NEUTROABS 6.6 01/06/2021    ASSESSMENT & PLAN:  Malignant neoplasm of upper-inner quadrant of right breast in female, estrogen receptor positive ([place]) 01/22/2020: Right medial lumpectomy: Grade 3 IDC 2.3 cm with high-grade DCIS with necrosis, margins negative, negative for lymphovascular or  perineural invasion, Right lateral lumpectomy: Isolated foci of DCIS high-grade, resection margins negative 4 lymph nodes negative, ER 90%, PR 30%, HER-2 3+ positive, Ki-67 30%  Treatment plan: 1.Adjuvant chemotherapy with Taxol Herceptin weekly x9(stopped early because of lower extremity edema)followed by Herceptin maintenance completed 01/27/2021 2.Adjuvant radiation therapystarted 6/10/2021completed 06/25/2020 3.Followed by adjuvant antiestrogen therapy  started 06/25/2020 ----------------------------------------------------------------------------------------------------------------------------------------------------------- Current treatment:  Echocardiogram 02/04/2020: EF 60 to 65%   Major depression: Currently on Prozac. Pain issues:Currently onPercocets 2.5 mg.  08/21/2020:Mildly comminuted and displaced fracture of proximal left humerus: Afterafall 10/02/2020: Left hip femoral fracture  Leg pain:Currently onPercocets.  Palliative care following Hypokalemia:On oral potassium daily. Insomnia: I renewed her Valium prescription today.  Adrenal mass and liver abnormalities:  CT CAP 04/09/2021: Unchanged left adrenal  mass 2 cm nonspecific.  No suspicious hepatic lesions.  Cholelithiasis, colon diverticulosis  Recommendation: Start antiestrogen therapy with anastrozole 1 mg daily. Anastrozole counseling: We discussed the risks and benefits of anti-estrogen therapy with aromatase inhibitors. These include but not limited to insomnia, hot flashes, mood changes, vaginal dryness, bone density loss, and weight gain. We strongly believe that the benefits far outweigh the risks. Patient understands these risks and consented to starting treatment. Planned treatment duration is 7 years.  Return to clinic in 3 months for follow-up  No orders of the defined types were placed in this encounter.  The patient has a good understanding of the overall plan. she agrees with it. she  will call with any problems that may develop before the next visit here.  Total time spent: 30 mins including face to face time and time spent for planning, charting and coordination of care  Rulon Eisenmenger, MD, MPH 04/13/2021  I, Molly Dorshimer, am acting as scribe for Dr. Nicholas Lose.  I have reviewed the above documentation for accuracy and completeness, and I agree with the above.

## 2021-04-13 NOTE — Assessment & Plan Note (Signed)
01/22/2020: Right medial lumpectomy: Grade 3 IDC 2.3 cm with high-grade DCIS with necrosis, margins negative, negative for lymphovascular or perineural invasion, Right lateral lumpectomy: Isolated foci of DCIS high-grade, resection margins negative 4 lymph nodes negative, ER 90%, PR 30%, HER-2 3+ positive, Ki-67 30%  Treatment plan: 1.Adjuvant chemotherapy with Taxol Herceptin weekly x9(stopped early because of lower extremity edema)followed by Herceptin maintenance completed 01/27/2021 2.Adjuvant radiation therapystarted 6/10/2021completed 06/25/2020 3.Followed by adjuvant antiestrogen therapy  started 06/25/2020 ----------------------------------------------------------------------------------------------------------------------------------------------------------- Current treatment:  Echocardiogram 02/04/2020: EF 60 to 65%   Major depression: Currently on Prozac. Pain issues:Currently onPercocets 2.5 mg.  08/21/2020:Mildly comminuted and displaced fracture of proximal left humerus: Afterafall 10/02/2020: Left hip femoral fracture  Leg pain:Currently onPercocets.  Palliative care following Hypokalemia:On oral potassium daily. Insomnia: I renewed her Valium prescription today.  Adrenal mass and liver abnormalities:  CT CAP 04/09/2021: Unchanged left adrenal mass 2 cm nonspecific.  No suspicious hepatic lesions.  Cholelithiasis, colon diverticulosis  Recommendation: Patient needs to be on antiestrogen therapy

## 2021-04-13 NOTE — Telephone Encounter (Signed)
Scheduled follow-up appointment per 4/25 los. Patient is aware. ?

## 2021-04-14 ENCOUNTER — Telehealth: Payer: Self-pay | Admitting: Orthopedic Surgery

## 2021-04-14 ENCOUNTER — Encounter: Payer: Medicare PPO | Admitting: Physical Therapy

## 2021-04-14 NOTE — Telephone Encounter (Signed)
See below. Were we not able to get her situated last week? Can you please get her in?

## 2021-04-14 NOTE — Telephone Encounter (Signed)
Patient called reminding Dr. Marlou Sa to send a referral for physical therapy on her shoulder. Please call patient at [phone].

## 2021-04-14 NOTE — Telephone Encounter (Signed)
I called and left her 2 messages. She called back and now is scheduled!!

## 2021-04-16 ENCOUNTER — Encounter: Payer: Medicare PPO | Admitting: Physical Therapy

## 2021-04-20 ENCOUNTER — Ambulatory Visit (HOSPITAL_COMMUNITY): Payer: Medicare PPO | Attending: Cardiovascular Disease

## 2021-04-20 ENCOUNTER — Other Ambulatory Visit: Payer: Self-pay

## 2021-04-20 DIAGNOSIS — I4819 Other persistent atrial fibrillation: Secondary | ICD-10-CM | POA: Diagnosis not present

## 2021-04-20 LAB — ECHOCARDIOGRAM COMPLETE
AR max vel: 1.87 cm2
AV Area VTI: 1.78 cm2
AV Area mean vel: 1.73 cm2
AV Mean grad: 8.3 mmHg
AV Peak grad: 14.4 mmHg
Ao pk vel: 1.9 m/s
P 1/2 time: 320 msec
S' Lateral: 3.2 cm

## 2021-04-21 ENCOUNTER — Ambulatory Visit (INDEPENDENT_AMBULATORY_CARE_PROVIDER_SITE_OTHER): Payer: Medicare PPO | Admitting: Physical Therapy

## 2021-04-21 ENCOUNTER — Encounter: Payer: Medicare PPO | Admitting: Physical Therapy

## 2021-04-21 ENCOUNTER — Encounter: Payer: Self-pay | Admitting: Physical Therapy

## 2021-04-21 DIAGNOSIS — R600 Localized edema: Secondary | ICD-10-CM | POA: Diagnosis not present

## 2021-04-21 DIAGNOSIS — R296 Repeated falls: Secondary | ICD-10-CM | POA: Diagnosis not present

## 2021-04-21 DIAGNOSIS — R262 Difficulty in walking, not elsewhere classified: Secondary | ICD-10-CM

## 2021-04-21 DIAGNOSIS — M6281 Muscle weakness (generalized): Secondary | ICD-10-CM

## 2021-04-21 DIAGNOSIS — M25612 Stiffness of left shoulder, not elsewhere classified: Secondary | ICD-10-CM | POA: Diagnosis not present

## 2021-04-21 DIAGNOSIS — M25512 Pain in left shoulder: Secondary | ICD-10-CM | POA: Diagnosis not present

## 2021-04-21 DIAGNOSIS — R293 Abnormal posture: Secondary | ICD-10-CM | POA: Diagnosis not present

## 2021-04-21 NOTE — Therapy (Signed)
[facility] [address] [place], [place], [address] Phone: [phone]   Fax:  [phone]  Physical Therapy Treatment  Patient Details  Name: [name] MRN: [id number] Date of Birth: 1951-04-07 Referring Provider (PT): Donella Stade, [place]   Encounter Date: 04/21/2021   PT End of Session - 04/21/21 1457    Visit Number 2    Number of Visits 24    Date for PT Re-Evaluation 06/18/21    Authorization Type [employer]    PT Start Time 1430    PT Stop Time 1510    PT Time Calculation (min) 40 min    Activity Tolerance Patient tolerated treatment well    Behavior During Therapy [facility] for tasks assessed/performed           Past Medical History:  Diagnosis Date  . Anxiety   . Cancer (HCC)    breast  . Chronic pain   . Concussion 3/08   ICU x 3 days  . Depression   . Hypertension   . Hypothyroidism   . Spinal stenosis   . Thyroid disease ?1994    Past Surgical History:  Procedure Laterality Date  . BREAST LUMPECTOMY WITH RADIOACTIVE SEED AND SENTINEL LYMPH NODE BIOPSY Right 01/22/2020   Procedure: RIGHT BREAST LUMPECTOMY WITH RADIOACTIVE SEED X2 AND RIGHT SENTINEL LYMPH NODE MAPPING;  Surgeon: Erroll Luna, MD;  Location: [place];  Service: General;  Laterality: Right;  . BREAST SURGERY Right 4/99   breast biopsy, benign  . EYE SURGERY Right   . PORTACATH PLACEMENT Right 01/22/2020   Procedure: INSERTION PORT-A-CATH WITH ULTRASOUND;  Surgeon: Erroll Luna, MD;  Location: [place];  Service: General;  Laterality: Right;  . PORTACATH PLACEMENT Right 02/28/2020   Procedure: PORT A CATH REVISION;  Surgeon: Erroll Luna, MD;  Location: [place];  Service: General;  Laterality: Right;  . REVERSE SHOULDER ARTHROPLASTY Left 02/02/2021   Procedure: LEFT REVERSE SHOULDER ARTHROPLASTY;  Surgeon: Meredith Pel, MD;  Location: [place];  Service: Orthopedics;  Laterality: Left;  . SHOULDER SURGERY Right    . SPINAL FUSION  03/04/11   with ORIF    There were no vitals filed for this visit.   Subjective Assessment - 04/21/21 1457    Subjective Pt arriving to therapy today reporting 4/10 in left shoulder.    Pertinent History anxiety, breast cancer with lumpectomy and radioactive seed placement 2021 and recent port removal, chronic pain, concussion 2008, HTN, depression, spinal fusion 2012.    Patient Stated Goals improve function of Lt arm, raise shoulder over her head to do her hair    Currently in Pain? Yes    Pain Score 4     Pain Location Shoulder    Pain Orientation Left    Pain Descriptors / Indicators Aching;Sore    Pain Type Surgical pain    Pain Onset More than a month ago    Pain Frequency Constant                             [facility] Adult PT Treatment/Exercise - 04/21/21 0001      Exercises   Exercises Shoulder      Shoulder Exercises: Seated   Retraction Limitations scapular retraction x 10 holding 5 seconds      Shoulder Exercises: ROM/Strengthening   Pendulum 1 minute each: clockwise, counter clockwise, forward/back, side to side      Shoulder Exercises: Stretch  Table Stretch -Flexion Limitations 10 reps x 5 seconds    Table Stretch - External Rotation Limitations 10 reps x 5 seconds    Other Shoulder Stretches table stretch scaption: 10 reps holding 5 seconds      Modalities   Modalities Vasopneumatic      Vasopneumatic   Number Minutes Vasopneumatic  10 minutes    Vasopnuematic Location  Shoulder    Vasopneumatic Pressure Low    Vasopneumatic Temperature  34      Manual Therapy   Manual therapy comments PROM left shoulder: grade 2 GH mobs                    PT Short Term Goals - 04/21/21 1501      PT SHORT TERM GOAL #1   Title independent with intial HEP    Status On-going      PT SHORT TERM GOAL #2   Title improve Lt shoulder passive ER to 30 deg    Status On-going             PT Long Term Goals - 03/26/21  1423      PT LONG TERM GOAL #1   Title independet with final HEP    Time 12    Period Weeks    Status New    Target Date 06/18/21      PT LONG TERM GOAL #2   Title Lt shoulder active flexion and abduction improved to at least 90 deg in order to perform ADLs    Time 12    Period Weeks    Status New    Target Date 06/18/21      PT LONG TERM GOAL #3   Title report pain < 3/10 with activity for improved function    Time 12    Period Weeks    Status New    Target Date 06/18/21      PT LONG TERM GOAL #4   Title FOTO score improved to 61 for improved function    Time 12    Period Weeks    Status New    Target Date 06/18/21      PT LONG TERM GOAL #5   Title demonstrate at least 3/5 Lt shoulder flex/abduction strength for improved function    Time 12    Period Weeks    Status New    Target Date 06/18/21                 Plan - 04/21/21 1458    Clinical Impression Statement Pt arriving to therapy today reporting 4/10 pain in her left shoulder. Pt stating Dr. Marlou Sa told her she cold start using her sholder more at home just no lifting. PROM performed today. I added table slides flexion, scaption, and ER to pt's HEP. Pt's goal is to be able to fix her hair. Continue skilled PT.    Personal Factors and Comorbidities Comorbidity 3+    Comorbidities anxiety, breast cancer with lumpectomy and radioactive seed placement 2021 and recent port removal, chronic pain, concussion 2008, HTN, depression, spinal fusion 2012    Examination-Activity Limitations Locomotion Level;Reach Overhead;Dressing;Hygiene/Grooming;Lift;Toileting;Bathing    Examination-Participation Restrictions Cleaning;Meal Prep;Community Activity;Driving;Laundry    Rehab Potential Good    PT Frequency 2x / week    PT Duration 12 weeks    PT Treatment/Interventions ADLs/Self Care Home Management;Therapeutic exercise;Patient/family education;Cryotherapy;DME Instruction;Ultrasound;Moist Heat;Functional mobility  training;Therapeutic activities;Balance training;Neuromuscular re-education;Manual techniques;Vasopneumatic Device;Taping;Dry needling;Passive range of motion    PT Next Visit  Plan PROM: table slides flexion, scaption, ER, scap retraction, see if MD has allowed increased ROM and progress as able    PT Home Exercise Plan Access Code: [email or web address]    Consulted and Agree with Plan of Care Patient           Patient will benefit from skilled therapeutic intervention in order to improve the following deficits and impairments:  Postural dysfunction,Decreased range of motion,Decreased knowledge of precautions,Increased edema,Impaired UE functional use,Decreased strength,Decreased mobility,Decreased balance,Pain  Visit Diagnosis: Acute pain of left shoulder  Stiffness of left shoulder, not elsewhere classified  Abnormal posture  Repeated falls  Muscle weakness (generalized)  Localized edema  Difficulty in walking, not elsewhere classified     Problem List Patient Active Problem List   Diagnosis Date Noted  . Arthritis of left shoulder region   . S/P reverse total shoulder arthroplasty, 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  . Vitamin D deficiency 01/15/2021  . Vitamin B12 deficiency 01/15/2021  . Primary insomnia 01/15/2021  . Morbid obesity ([place]) 01/15/2021  . Moderate recurrent major depression ([place]) 01/15/2021  . Benzodiazepine dependence ([place]) 01/15/2021  . Port-A-Cath in place 07/22/2020  . Left leg cellulitis 04/17/2020  . Cellulitis of left leg 04/17/2020  . Hypokalemia 04/17/2020  . Macrocytic anemia 04/17/2020  . Anxiety 04/17/2020  . Depression 04/17/2020  . Chronic diarrhea 04/17/2020  . Chemotherapy induced diarrhea 04/17/2020  . Malignant neoplasm of upper-inner quadrant of right breast in female, estrogen receptor positive ([place]) 12/26/2019  . Encephalopathy 02/02/2018  . Hypothyroidism 02/02/2018  . AKI (acute kidney injury) ([place]) 02/02/2018  . Chronic back pain 02/02/2018  . Alcohol  abuse 02/02/2018  . Weight gain 02/02/2018  . Benign essential HTN 02/02/2018    Oretha Caprice, PT, MPT 04/21/2021, 3:07 PM  [facility] [address] [place], [place], [address] Phone: [phone]   Fax:  [phone]  Name: [name] MRN: [id number] Date of Birth: [birth year]

## 2021-04-23 ENCOUNTER — Encounter (HOSPITAL_BASED_OUTPATIENT_CLINIC_OR_DEPARTMENT_OTHER): Payer: Self-pay | Admitting: Surgery

## 2021-04-23 ENCOUNTER — Telehealth: Payer: Self-pay | Admitting: Cardiovascular Disease

## 2021-04-23 ENCOUNTER — Other Ambulatory Visit: Payer: Self-pay

## 2021-04-23 ENCOUNTER — Encounter: Payer: Medicare PPO | Admitting: Physical Therapy

## 2021-04-23 NOTE — Progress Notes (Signed)
Patient currently takes Eliquis 5mg . Spoke with Abigail Butts at Dr. Josetta Huddle office regarding obtaining an order from Dr.O'Neal to hold Eliquis prior to surgery.

## 2021-04-23 NOTE — Telephone Encounter (Signed)
   Name: [name]  DOB: August 09, 1951  MRN: [id number]   Primary Cardiologist: Evalina Field, MD  Chart reviewed as part of pre-operative protocol coverage. Patient was contacted 04/23/2021 in reference to pre-operative risk assessment for pending surgery as outlined below.  [name] was last seen on 02/25/21 by Dr. Audie Box.  Since that day, [name] has done fine from a cardiac standpoint. She can complete 4 METs without anginal complaints.  Therefore, based on ACC/AHA guidelines, the patient would be at acceptable risk for the planned procedure without further cardiovascular testing.   The patient was advised that if she develops new symptoms prior to surgery to contact our office to arrange for a follow-up visit, and she verbalized understanding.  Per pharmacy recommendations, patient can hold eliquis 1 day prior to her upcoming procedure with plans to restart as soon as she is cleared to do so by her surgeon.   I will route this recommendation to the requesting party via Epic fax function and remove from pre-op pool. Please call with questions.  Abigail Butts, PA-C 04/23/2021, 3:31 PM

## 2021-04-23 NOTE — Telephone Encounter (Signed)
   [facility] Pre-operative Risk Assessment    Patient Name: [name]  DOB: 1951/10/01  MRN: [id number]   HEARTCARE STAFF: - Please ensure there is not already an duplicate clearance open for this procedure. - Under Visit Info/Reason for Call, type in Other and utilize the format Clearance MM/DD/YY or Clearance TBD. Do not use dashes or single digits. - If request is for dental extraction, please clarify the # of teeth to be extracted.  Request for surgical clearance:  1. What type of surgery is being performed? Cath removed   2. When is this surgery scheduled? 04-30-21   3. What type of clearance is required (medical clearance vs. Pharmacy clearance to hold med vs. Both)? Medicine  4. Are there any medications that need to be held prior to surgery and how long? Eliquis   5. Practice name and name of physician performing surgery?  Dr [name]  6. What is the office phone number? [phone]   7.   What is the office fax number? [phone]  8.   Anesthesia type (None, local, MAC, general) ? MAC  [name] 04/23/2021, 10:05 AM  _________________________________________________________________   (provider comments below)

## 2021-04-23 NOTE — Telephone Encounter (Signed)
Patient with diagnosis of afib on Eliquis for anticoagulation.    Procedure: port a cath removal Date of procedure: 04/30/21  CHA2DS2-VASc Score = 3  This indicates a 3.2% annual risk of stroke. The patient's score is based upon: CHF History: No HTN History: Yes Diabetes History: No Stroke History: No Vascular Disease History: No Age Score: 1 Gender Score: 1     CrCl 107 ml/min Platelet count 219  Per office protocol, patient can hold Eliquis for 1 day prior to procedure.

## 2021-04-23 NOTE — Telephone Encounter (Signed)
Pharmacy, can you please comment on how long Eliquis can be held prior to procedure?  Thank you!

## 2021-04-27 ENCOUNTER — Other Ambulatory Visit (HOSPITAL_COMMUNITY)
Admission: RE | Admit: 2021-04-27 | Discharge: 2021-04-27 | Disposition: A | Discharge status: Home / Self Care | Payer: Medicare PPO | Source: Ambulatory Visit | Attending: Surgery | Admitting: Surgery

## 2021-04-27 ENCOUNTER — Ambulatory Visit (INDEPENDENT_AMBULATORY_CARE_PROVIDER_SITE_OTHER): Payer: Medicare PPO | Admitting: Orthopedic Surgery

## 2021-04-27 DIAGNOSIS — Z01812 Encounter for preprocedural laboratory examination: Secondary | ICD-10-CM | POA: Insufficient documentation

## 2021-04-27 DIAGNOSIS — Z20822 Contact with and (suspected) exposure to covid-19: Secondary | ICD-10-CM | POA: Diagnosis not present

## 2021-04-27 DIAGNOSIS — Z96612 Presence of left artificial shoulder joint: Secondary | ICD-10-CM

## 2021-04-27 LAB — SARS CORONAVIRUS 2 (TAT 6-24 HRS): SARS Coronavirus 2: NEGATIVE

## 2021-04-28 ENCOUNTER — Encounter: Payer: Medicare PPO | Admitting: Physical Therapy

## 2021-04-28 ENCOUNTER — Encounter: Payer: Self-pay | Admitting: Physical Therapy

## 2021-04-28 ENCOUNTER — Other Ambulatory Visit: Payer: Self-pay

## 2021-04-28 ENCOUNTER — Ambulatory Visit (INDEPENDENT_AMBULATORY_CARE_PROVIDER_SITE_OTHER): Payer: Medicare PPO | Admitting: Physical Therapy

## 2021-04-28 ENCOUNTER — Encounter (HOSPITAL_BASED_OUTPATIENT_CLINIC_OR_DEPARTMENT_OTHER)
Admission: RE | Admit: 2021-04-28 | Discharge: 2021-04-28 | Disposition: A | Discharge status: Home / Self Care | Payer: Medicare PPO | Source: Ambulatory Visit | Attending: Surgery | Admitting: Surgery

## 2021-04-28 DIAGNOSIS — M25612 Stiffness of left shoulder, not elsewhere classified: Secondary | ICD-10-CM

## 2021-04-28 DIAGNOSIS — R600 Localized edema: Secondary | ICD-10-CM | POA: Diagnosis not present

## 2021-04-28 DIAGNOSIS — Z01812 Encounter for preprocedural laboratory examination: Secondary | ICD-10-CM | POA: Diagnosis not present

## 2021-04-28 DIAGNOSIS — R296 Repeated falls: Secondary | ICD-10-CM | POA: Diagnosis not present

## 2021-04-28 DIAGNOSIS — M25512 Pain in left shoulder: Secondary | ICD-10-CM | POA: Diagnosis not present

## 2021-04-28 DIAGNOSIS — R262 Difficulty in walking, not elsewhere classified: Secondary | ICD-10-CM | POA: Diagnosis not present

## 2021-04-28 DIAGNOSIS — M6281 Muscle weakness (generalized): Secondary | ICD-10-CM

## 2021-04-28 DIAGNOSIS — R293 Abnormal posture: Secondary | ICD-10-CM

## 2021-04-28 LAB — BASIC METABOLIC PANEL
Anion gap: 7 (ref 5–15)
BUN: 15 mg/dL (ref 8–23)
CO2: 27 mmol/L (ref 22–32)
Calcium: 8.7 mg/dL — ABNORMAL LOW (ref 8.9–10.3)
Chloride: 104 mmol/L (ref 98–111)
Creatinine, Ser: 0.69 mg/dL (ref 0.44–1.00)
GFR, Estimated: >60 mL/min (ref >60)
Glucose, Bld: 87 mg/dL (ref 70–99)
Potassium: 4.4 mmol/L (ref 3.5–5.1)
Sodium: 138 mmol/L (ref 135–145)

## 2021-04-28 NOTE — Progress Notes (Signed)

## 2021-04-28 NOTE — Therapy (Signed)
[facility] [address] [place], [place], [address] Phone: [phone]   Fax:  [phone]  Physical Therapy Treatment  Patient Details  Name: [name] MRN: [id number] Date of Birth: June 15, 1951 Referring Provider (PT): Donella Stade, [place]   Encounter Date: 04/28/2021   PT End of Session - 04/28/21 1437    Visit Number 3    Number of Visits 24    Date for PT Re-Evaluation 06/18/21    Authorization Type [employer]    PT Start Time 2951    PT Stop Time 1510    PT Time Calculation (min) 38 min    Activity Tolerance Patient tolerated treatment well    Behavior During Therapy [facility] for tasks assessed/performed           Past Medical History:  Diagnosis Date  . Anxiety   . Atrial fibrillation [facility])    sees Dr. Audie Box  . Cancer [facility])    breast  . Chronic pain   . Concussion 3/08   ICU x 3 days  . Depression   . Hypertension   . Hypothyroidism   . Spinal stenosis   . Thyroid disease ?1994    Past Surgical History:  Procedure Laterality Date  . BREAST LUMPECTOMY WITH RADIOACTIVE SEED AND SENTINEL LYMPH NODE BIOPSY Right 01/22/2020   Procedure: RIGHT BREAST LUMPECTOMY WITH RADIOACTIVE SEED X2 AND RIGHT SENTINEL LYMPH NODE MAPPING;  Surgeon: Erroll Luna, MD;  Location: [place];  Service: General;  Laterality: Right;  . BREAST SURGERY Right 4/99   breast biopsy, benign  . EYE SURGERY Right   . PORTACATH PLACEMENT Right 01/22/2020   Procedure: INSERTION PORT-A-CATH WITH ULTRASOUND;  Surgeon: Erroll Luna, MD;  Location: [place];  Service: General;  Laterality: Right;  . PORTACATH PLACEMENT Right 02/28/2020   Procedure: PORT A CATH REVISION;  Surgeon: Erroll Luna, MD;  Location: [place];  Service: General;  Laterality: Right;  . REVERSE SHOULDER ARTHROPLASTY Left 02/02/2021   Procedure: LEFT REVERSE SHOULDER ARTHROPLASTY;  Surgeon: Meredith Pel, MD;  Location: [place];  Service:  Orthopedics;  Laterality: Left;  . SHOULDER SURGERY Right   . SPINAL FUSION  03/04/11   with ORIF    There were no vitals filed for this visit.   Subjective Assessment - 04/28/21 1435    Subjective Pt arriving today reporting 2/10 pain in left shoulder. Pt reporting HEP is going well. Pt reporting increased pain firsts thing in the morning. Pt discussed weaning off tramadol and just using it first thing in the morning with an ibrophen.    Pertinent History anxiety, breast cancer with lumpectomy and radioactive seed placement 2021 and recent port removal, chronic pain, concussion 2008, HTN, depression, spinal fusion 2012.    Patient Stated Goals improve function of Lt arm, raise shoulder over her head to do her hair    Currently in Pain? Yes    Pain Score 2     Pain Location Shoulder    Pain Orientation Left    Pain Descriptors / Indicators Aching;Sore    Pain Type Surgical pain    Pain Onset More than a month ago    Pain Frequency Constant                             [facility] Adult PT Treatment/Exercise - 04/28/21 0001      Exercises   Exercises Shoulder      Shoulder  Exercises: Seated   Retraction Limitations scapular retraction x 10 holding 5 seconds      Shoulder Exercises: ROM/Strengthening   Pendulum 1 minute each: clockwise, counter clockwise, forward/back, side to side      Shoulder Exercises: Stretch   Table Stretch -Flexion Limitations 10 reps x 5 seconds    Table Stretch - External Rotation Limitations 10 reps x 5 seconds    Other Shoulder Stretches table stretch scaption: 10 reps holding 5 seconds      Modalities   Modalities Vasopneumatic      Vasopneumatic   Number Minutes Vasopneumatic  10 minutes    Vasopnuematic Location  Shoulder    Vasopneumatic Pressure Low    Vasopneumatic Temperature  34      Manual Therapy   Manual therapy comments PROM left shoulder: grade 2 GH mobs                    PT Short Term Goals - 04/28/21  1438      PT SHORT TERM GOAL #1   Title independent with intial HEP    Status On-going      PT SHORT TERM GOAL #2   Title improve Lt shoulder passive ER to 30 deg    Status On-going             PT Long Term Goals - 03/26/21 1423      PT LONG TERM GOAL #1   Title independet with final HEP    Time 12    Period Weeks    Status New    Target Date 06/18/21      PT LONG TERM GOAL #2   Title Lt shoulder active flexion and abduction improved to at least 90 deg in order to perform ADLs    Time 12    Period Weeks    Status New    Target Date 06/18/21      PT LONG TERM GOAL #3   Title report pain < 3/10 with activity for improved function    Time 12    Period Weeks    Status New    Target Date 06/18/21      PT LONG TERM GOAL #4   Title FOTO score improved to 61 for improved function    Time 12    Period Weeks    Status New    Target Date 06/18/21      PT LONG TERM GOAL #5   Title demonstrate at least 3/5 Lt shoulder flex/abduction strength for improved function    Time 12    Period Weeks    Status New    Target Date 06/18/21                 Plan - 04/28/21 1448    Clinical Impression Statement Pt reporting she has been doing more active movements at home. Unable to pull up Dr. Randel Pigg note from yesterday's visit. Still waiting on orders to begin more acitve/active assisted ROM. Pt limited to PROM and flexion and abduction to 90 degrees.   Pt compliant in her HEP. Continue skilled PT.    Personal Factors and Comorbidities Comorbidity 3+    Comorbidities anxiety, breast cancer with lumpectomy and radioactive seed placement 2021 and recent port removal, chronic pain, concussion 2008, HTN, depression, spinal fusion 2012    Examination-Activity Limitations Locomotion Level;Reach Overhead;Dressing;Hygiene/Grooming;Lift;Toileting;Bathing    Examination-Participation Restrictions Cleaning;Meal Prep;Community Activity;Driving;Laundry    Stability/Clinical Decision Making  Evolving/Moderate complexity    Rehab  Potential Good    PT Frequency 2x / week    PT Duration 12 weeks    PT Treatment/Interventions ADLs/Self Care Home Management;Therapeutic exercise;Patient/family education;Cryotherapy;DME Instruction;Ultrasound;Moist Heat;Functional mobility training;Therapeutic activities;Balance training;Neuromuscular re-education;Manual techniques;Vasopneumatic Device;Taping;Dry needling;Passive range of motion    PT Next Visit Plan PROM: table slides flexion, scaption, ER, scap retraction, see if MD has allowed increased ROM and progress as able    PT Home Exercise Plan Access Code: [email or web address]    Consulted and Agree with Plan of Care Patient           Patient will benefit from skilled therapeutic intervention in order to improve the following deficits and impairments:  Postural dysfunction,Decreased range of motion,Decreased knowledge of precautions,Increased edema,Impaired UE functional use,Decreased strength,Decreased mobility,Decreased balance,Pain  Visit Diagnosis: Acute pain of left shoulder  Stiffness of left shoulder, not elsewhere classified  Abnormal posture  Repeated falls  Muscle weakness (generalized)  Localized edema  Difficulty in walking, not elsewhere classified     Problem List Patient Active Problem List   Diagnosis Date Noted  . Arthritis of left shoulder region   . S/P reverse total shoulder arthroplasty, 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  . Vitamin D deficiency 01/15/2021  . Vitamin B12 deficiency 01/15/2021  . Primary insomnia 01/15/2021  . Morbid obesity ([place]) 01/15/2021  . Moderate recurrent major depression ([place]) 01/15/2021  . Benzodiazepine dependence ([place]) 01/15/2021  . Port-A-Cath in place 07/22/2020  . Left leg cellulitis 04/17/2020  . Cellulitis of left leg 04/17/2020  . Hypokalemia 04/17/2020  . Macrocytic anemia 04/17/2020  . Anxiety 04/17/2020  . Depression 04/17/2020  . Chronic diarrhea 04/17/2020  . Chemotherapy induced  diarrhea 04/17/2020  . Malignant neoplasm of upper-inner quadrant of right breast in female, estrogen receptor positive ([place]) 12/26/2019  . Encephalopathy 02/02/2018  . Hypothyroidism 02/02/2018  . AKI (acute kidney injury) ([place]) 02/02/2018  . Chronic back pain 02/02/2018  . Alcohol abuse 02/02/2018  . Weight gain 02/02/2018  . Benign essential HTN 02/02/2018    Oretha Caprice, PT, MPT 04/28/2021, 3:11 PM  [facility] [address] [place], [place], [address] Phone: [phone]   Fax:  [phone]  Name: SALLY-ANN CUTBIRTH MRN: [id number] Date of Birth: 08-15-1951

## 2021-04-29 ENCOUNTER — Encounter: Payer: Self-pay | Admitting: Orthopedic Surgery

## 2021-04-29 MED ORDER — TRAMADOL HCL 50 MG PO TABS: 50.0000 mg | ORAL_TABLET | Freq: Three times a day (TID) | ORAL | 0 refills | Status: DC | PRN

## 2021-04-29 NOTE — Progress Notes (Signed)
Post-Op Visit Note   Patient: [name]           Date of Birth: 1951-02-07           MRN: [id number] Visit Date: 04/27/2021 PCP: Maurice Small, MD   Assessment & Plan:  Chief Complaint:  Chief Complaint  Patient presents with  . Left Shoulder - Routine Post Op   Visit Diagnoses:  1. History of arthroplasty of left shoulder     Plan: Patient is a 70 year old female who presents s/p left reverse shoulder arthroplasty on 02/02/2021.  She reports that she is making slow but steady improvements.  She is going to physical therapy where she has done 2 sessions so far with 1 session tomorrow.  She does endorse continued pain for which she takes Advil and tramadol.  She is not taking as much tramadol as she was in the past.  She is waking up around 5 AM most days.  Denies any fevers, chills, drainage, night sweats.  On exam she has incision that is healing well without any evidence of infection or dehiscence.  She has 25 degrees external rotation, 80 degrees abduction, 115 degrees forward flexion.  She has good strength of subscapularis on exam today.  Axillary nerve is intact with deltoid firing.  She has much improved active range of motion of the left shoulder and there is no significant abnormal crepitus with passive motion of the shoulder.  Plan to refill tramadol and continue with physical therapy.  Follow-up in 6 weeks for clinical recheck.  Radiographs on return visit due to poor bone quality at the time of index procedure.  Follow-Up Instructions: No follow-ups on file.   Orders:  No orders of the defined types were placed in this encounter.  No orders of the defined types were placed in this encounter.   Imaging: No results found.  PMFS History: Patient Active Problem List   Diagnosis Date Noted  . Arthritis of left shoulder region   . S/P reverse total shoulder arthroplasty, 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  . Vitamin D deficiency 01/15/2021  . Vitamin B12 deficiency 01/15/2021  .  Primary insomnia 01/15/2021  . Morbid obesity ([place]) 01/15/2021  . Moderate recurrent major depression ([place]) 01/15/2021  . Benzodiazepine dependence ([place]) 01/15/2021  . Port-A-Cath in place 07/22/2020  . Left leg cellulitis 04/17/2020  . Cellulitis of left leg 04/17/2020  . Hypokalemia 04/17/2020  . Macrocytic anemia 04/17/2020  . Anxiety 04/17/2020  . Depression 04/17/2020  . Chronic diarrhea 04/17/2020  . Chemotherapy induced diarrhea 04/17/2020  . Malignant neoplasm of upper-inner quadrant of right breast in female, estrogen receptor positive ([place]) 12/26/2019  . Encephalopathy 02/02/2018  . Hypothyroidism 02/02/2018  . AKI (acute kidney injury) ([place]) 02/02/2018  . Chronic back pain 02/02/2018  . Alcohol abuse 02/02/2018  . Weight gain 02/02/2018  . Benign essential HTN 02/02/2018   Past Medical History:  Diagnosis Date  . Anxiety   . Atrial fibrillation [facility])    sees Dr. Audie Box  . Cancer [facility])    breast  . Chronic pain   . Concussion 3/08   ICU x 3 days  . Depression   . Hypertension   . Hypothyroidism   . Spinal stenosis   . Thyroid disease ?1994    Family History  Problem Relation Age of Onset  . Thyroid disease Mother   . Dementia Mother   . Diabetes Father   . Stroke Father   . Hypertension Sister   . Diabetes Sister  Past Surgical History:  Procedure Laterality Date  . BREAST LUMPECTOMY WITH RADIOACTIVE SEED AND SENTINEL LYMPH NODE BIOPSY Right 01/22/2020   Procedure: RIGHT BREAST LUMPECTOMY WITH RADIOACTIVE SEED X2 AND RIGHT SENTINEL LYMPH NODE MAPPING;  Surgeon: Erroll Luna, MD;  Location: [place];  Service: General;  Laterality: Right;  . BREAST SURGERY Right 4/99   breast biopsy, benign  . EYE SURGERY Right   . PORTACATH PLACEMENT Right 01/22/2020   Procedure: INSERTION PORT-A-CATH WITH ULTRASOUND;  Surgeon: Erroll Luna, MD;  Location: [place];  Service: General;  Laterality: Right;  . PORTACATH PLACEMENT  Right 02/28/2020   Procedure: PORT A CATH REVISION;  Surgeon: Erroll Luna, MD;  Location: [place];  Service: General;  Laterality: Right;  . REVERSE SHOULDER ARTHROPLASTY Left 02/02/2021   Procedure: LEFT REVERSE SHOULDER ARTHROPLASTY;  Surgeon: Meredith Pel, MD;  Location: [place];  Service: Orthopedics;  Laterality: Left;  . SHOULDER SURGERY Right   . SPINAL FUSION  03/04/11   with ORIF   Social History   Occupational History  . 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 Smoking status: Former Smoker    Years: 20.00    Types: Cigarettes  . Smokeless tobacco: Never Used  Vaping Use  . Vaping Use: Never used  Substance and Sexual Activity  . Alcohol use: Yes    Alcohol/week: 5.0 standard drinks    Types: 5 Standard drinks or equivalent per week    Comment: wine  . Drug use: No  . Sexual activity: Not Currently    Partners: Male    Birth control/protection: Post-menopausal

## 2021-04-29 NOTE — Anesthesia Preprocedure Evaluation (Addendum)
Anesthesia Evaluation  Patient identified by MRN, date of birth, ID band Patient awake    Reviewed: Allergy & Precautions, NPO status , Patient's Chart, lab work & pertinent test results  Airway Mallampati: II  TM Distance: >3 FB Neck ROM: Full    Dental no notable dental hx. (+) Teeth Intact, Dental Advisory Given   Pulmonary former smoker,    Pulmonary exam normal breath sounds clear to auscultation       Cardiovascular hypertension, Pt. on medications Normal cardiovascular exam Rhythm:Regular Rate:Normal     Neuro/Psych PSYCHIATRIC DISORDERS Anxiety    GI/Hepatic negative GI ROS, Neg liver ROS,   Endo/Other  Hypothyroidism   Renal/GU      Musculoskeletal negative musculoskeletal ROS (+)   Abdominal (+) + obese,   Peds  Hematology Lab Results      Component                Value               Date                      WBC                      9.4                 02/04/2021                HGB                      10.0 (L)            02/04/2021                HCT                      31.0 (L)            02/04/2021                MCV                      105.8 (H)           02/04/2021                PLT                      219                 02/04/2021              Anesthesia Other Findings Breast CA  Reproductive/Obstetrics                            Anesthesia Physical Anesthesia Plan  ASA: III  Anesthesia Plan: MAC   Post-op Pain Management:    Induction:   PONV Risk Score and Plan: 3 and Treatment may vary due to age or medical condition, Midazolam and Ondansetron  Airway Management Planned: Natural Airway and Nasal Cannula  Additional Equipment: None  Intra-op Plan:   Post-operative Plan:   Informed Consent: I have reviewed the patients History and Physical, chart, labs and discussed the procedure including the risks, benefits and alternatives for the proposed  anesthesia with the patient or authorized representative who has indicated his/her understanding and acceptance.     Dental advisory  given  Plan Discussed with: CRNA and Anesthesiologist  Anesthesia Plan Comments: (Mac for port removal)       Anesthesia Quick Evaluation

## 2021-04-30 ENCOUNTER — Encounter (HOSPITAL_BASED_OUTPATIENT_CLINIC_OR_DEPARTMENT_OTHER): Payer: Self-pay | Admitting: Surgery

## 2021-04-30 ENCOUNTER — Encounter: Payer: Medicare PPO | Admitting: Rehabilitative and Restorative Service Providers"

## 2021-04-30 ENCOUNTER — Ambulatory Visit (HOSPITAL_BASED_OUTPATIENT_CLINIC_OR_DEPARTMENT_OTHER): Payer: Medicare PPO | Admitting: Anesthesiology

## 2021-04-30 ENCOUNTER — Other Ambulatory Visit: Payer: Self-pay

## 2021-04-30 ENCOUNTER — Ambulatory Visit (HOSPITAL_BASED_OUTPATIENT_CLINIC_OR_DEPARTMENT_OTHER)
Admission: RE | Admit: 2021-04-30 | Discharge: 2021-04-30 | Disposition: A | Discharge status: Home / Self Care | Payer: Medicare PPO | Attending: Surgery | Admitting: Surgery

## 2021-04-30 ENCOUNTER — Encounter: Payer: Medicare PPO | Admitting: Physical Therapy

## 2021-04-30 ENCOUNTER — Encounter (HOSPITAL_BASED_OUTPATIENT_CLINIC_OR_DEPARTMENT_OTHER)
Admission: RE | Disposition: A | Discharge status: Home / Self Care | Payer: Self-pay | Source: Home / Self Care | Attending: Surgery

## 2021-04-30 DIAGNOSIS — Z452 Encounter for adjustment and management of vascular access device: Secondary | ICD-10-CM | POA: Insufficient documentation

## 2021-04-30 DIAGNOSIS — Z791 Long term (current) use of non-steroidal anti-inflammatories (NSAID): Secondary | ICD-10-CM | POA: Diagnosis not present

## 2021-04-30 DIAGNOSIS — C50919 Malignant neoplasm of unspecified site of unspecified female breast: Secondary | ICD-10-CM | POA: Diagnosis not present

## 2021-04-30 DIAGNOSIS — C50211 Malignant neoplasm of upper-inner quadrant of right female breast: Secondary | ICD-10-CM | POA: Diagnosis not present

## 2021-04-30 DIAGNOSIS — Z79899 Other long term (current) drug therapy: Secondary | ICD-10-CM | POA: Insufficient documentation

## 2021-04-30 DIAGNOSIS — Z888 Allergy status to other drugs, medicaments and biological substances status: Secondary | ICD-10-CM | POA: Insufficient documentation

## 2021-04-30 DIAGNOSIS — Z79890 Hormone replacement therapy (postmenopausal): Secondary | ICD-10-CM | POA: Insufficient documentation

## 2021-04-30 DIAGNOSIS — Z170 Estrogen receptor positive status [ER+]: Secondary | ICD-10-CM | POA: Diagnosis not present

## 2021-04-30 DIAGNOSIS — Z7901 Long term (current) use of anticoagulants: Secondary | ICD-10-CM | POA: Diagnosis not present

## 2021-04-30 DIAGNOSIS — Z79811 Long term (current) use of aromatase inhibitors: Secondary | ICD-10-CM | POA: Diagnosis not present

## 2021-04-30 DIAGNOSIS — Z87891 Personal history of nicotine dependence: Secondary | ICD-10-CM | POA: Insufficient documentation

## 2021-04-30 DIAGNOSIS — E876 Hypokalemia: Secondary | ICD-10-CM | POA: Diagnosis not present

## 2021-04-30 HISTORY — PX: PORT-A-CATH REMOVAL: SHX5289

## 2021-04-30 HISTORY — DX: Unspecified atrial fibrillation: I48.91

## 2021-04-30 SURGERY — REMOVAL PORT-A-CATH
Anesthesia: Monitor Anesthesia Care | Site: Chest | Laterality: Right

## 2021-04-30 MED ORDER — FENTANYL CITRATE (PF) 100 MCG/2ML IJ SOLN
INTRAMUSCULAR | Status: AC
  Filled 2021-04-30: qty 2

## 2021-04-30 MED ORDER — FENTANYL CITRATE (PF) 100 MCG/2ML IJ SOLN
INTRAMUSCULAR | Status: DC | PRN
  Administered 2021-04-30: 25 ug via INTRAVENOUS
  Administered 2021-04-30: 50 ug via INTRAVENOUS

## 2021-04-30 MED ORDER — LACTATED RINGERS IV SOLN: INTRAVENOUS | Status: DC

## 2021-04-30 MED ORDER — IBUPROFEN 800 MG PO TABS: 800.0000 mg | ORAL_TABLET | Freq: Three times a day (TID) | ORAL | 0 refills | Status: DC | PRN

## 2021-04-30 MED ORDER — CHLORHEXIDINE GLUCONATE CLOTH 2 % EX PADS: 6.0000 | MEDICATED_PAD | Freq: Once | CUTANEOUS | Status: DC

## 2021-04-30 MED ORDER — FENTANYL CITRATE (PF) 100 MCG/2ML IJ SOLN
25.0000 ug | INTRAMUSCULAR | Status: DC | PRN
  Administered 2021-04-30: 25 ug via INTRAVENOUS

## 2021-04-30 MED ORDER — PROPOFOL 500 MG/50ML IV EMUL
INTRAVENOUS | Status: DC | PRN
  Administered 2021-04-30: 200 ug/kg/min via INTRAVENOUS

## 2021-04-30 MED ORDER — PROPOFOL 10 MG/ML IV BOLUS
INTRAVENOUS | Status: AC
  Filled 2021-04-30: qty 20

## 2021-04-30 MED ORDER — ONDANSETRON HCL 4 MG/2ML IJ SOLN: 4.0000 mg | Freq: Once | INTRAMUSCULAR | Status: DC | PRN

## 2021-04-30 MED ORDER — ONDANSETRON HCL 4 MG/2ML IJ SOLN
INTRAMUSCULAR | Status: DC | PRN
  Administered 2021-04-30: 4 mg via INTRAVENOUS

## 2021-04-30 MED ORDER — ONDANSETRON HCL 4 MG/2ML IJ SOLN
INTRAMUSCULAR | Status: AC
  Filled 2021-04-30: qty 2

## 2021-04-30 MED ORDER — BUPIVACAINE-EPINEPHRINE 0.25% -1:200000 IJ SOLN
INTRAMUSCULAR | Status: DC | PRN
  Administered 2021-04-30: 10 mL

## 2021-04-30 MED ORDER — MIDAZOLAM HCL 2 MG/2ML IJ SOLN
INTRAMUSCULAR | Status: AC
  Filled 2021-04-30: qty 2

## 2021-04-30 MED ORDER — CEFAZOLIN SODIUM-DEXTROSE 2-4 GM/100ML-% IV SOLN
2.0000 g | INTRAVENOUS | Status: AC
  Administered 2021-04-30: 2 g via INTRAVENOUS

## 2021-04-30 MED ORDER — ACETAMINOPHEN 10 MG/ML IV SOLN: 1000.0000 mg | Freq: Once | INTRAVENOUS | Status: DC | PRN

## 2021-04-30 MED ORDER — MIDAZOLAM HCL 5 MG/5ML IJ SOLN
INTRAMUSCULAR | Status: DC | PRN
  Administered 2021-04-30: 1 mg via INTRAVENOUS

## 2021-04-30 MED ORDER — LIDOCAINE 2% (20 MG/ML) 5 ML SYRINGE
INTRAMUSCULAR | Status: AC
  Filled 2021-04-30: qty 5

## 2021-04-30 MED ORDER — CEFAZOLIN SODIUM-DEXTROSE 2-4 GM/100ML-% IV SOLN
INTRAVENOUS | Status: AC
  Filled 2021-04-30: qty 100

## 2021-04-30 SURGICAL SUPPLY — 36 items
ADH SKN CLS APL DERMABOND .7 (GAUZE/BANDAGES/DRESSINGS) ×1
APL PRP STRL LF DISP 70% ISPRP (MISCELLANEOUS) ×1
APL SKNCLS STERI-STRIP NONHPOA (GAUZE/BANDAGES/DRESSINGS)
BENZOIN TINCTURE PRP APPL 2/3 (GAUZE/BANDAGES/DRESSINGS) IMPLANT
BLADE SURG 15 STRL LF DISP TIS (BLADE) ×1 IMPLANT
BLADE SURG 15 STRL SS (BLADE) ×2
CHLORAPREP W/TINT 26 (MISCELLANEOUS) ×2 IMPLANT
COVER BACK TABLE 60X90IN (DRAPES) ×2 IMPLANT
COVER MAYO STAND STRL (DRAPES) ×2 IMPLANT
COVER WAND RF STERILE (DRAPES) IMPLANT
DECANTER SPIKE VIAL GLASS SM (MISCELLANEOUS) ×2 IMPLANT
DERMABOND ADVANCED (GAUZE/BANDAGES/DRESSINGS) ×1
DERMABOND ADVANCED .7 DNX12 (GAUZE/BANDAGES/DRESSINGS) ×1 IMPLANT
DRAPE LAPAROTOMY 100X72 PEDS (DRAPES) ×2 IMPLANT
DRAPE UTILITY XL STRL (DRAPES) ×2 IMPLANT
ELECT REM PT RETURN 9FT ADLT (ELECTROSURGICAL) ×2
ELECTRODE REM PT RTRN 9FT ADLT (ELECTROSURGICAL) ×1 IMPLANT
GLOVE SRG 8 PF TXTR STRL LF DI (GLOVE) ×1 IMPLANT
GLOVE SURG LTX SZ8 (GLOVE) ×2 IMPLANT
GLOVE SURG UNDER POLY LF SZ8 (GLOVE) ×2
GOWN STRL REUS W/ TWL LRG LVL3 (GOWN DISPOSABLE) ×2 IMPLANT
GOWN STRL REUS W/ TWL XL LVL3 (GOWN DISPOSABLE) ×1 IMPLANT
GOWN STRL REUS W/TWL LRG LVL3 (GOWN DISPOSABLE) ×4
GOWN STRL REUS W/TWL XL LVL3 (GOWN DISPOSABLE) ×2
NDL HYPO 25X1 1.5 SAFETY (NEEDLE) ×1 IMPLANT
NEEDLE HYPO 25X1 1.5 SAFETY (NEEDLE) ×2 IMPLANT
NS IRRIG 1000ML POUR BTL (IV SOLUTION) ×1 IMPLANT
PACK BASIN DAY SURGERY FS (CUSTOM PROCEDURE TRAY) ×2 IMPLANT
PENCIL SMOKE EVACUATOR (MISCELLANEOUS) ×1 IMPLANT
SLEEVE SCD COMPRESS KNEE MED (STOCKING) ×1 IMPLANT
SPONGE LAP 4X18 RFD (DISPOSABLE) ×2 IMPLANT
STRIP CLOSURE SKIN 1/2X4 (GAUZE/BANDAGES/DRESSINGS) IMPLANT
SUT MON AB 4-0 PC3 18 (SUTURE) ×2 IMPLANT
SUT VICRYL 3-0 CR8 SH (SUTURE) ×2 IMPLANT
SYR CONTROL 10ML LL (SYRINGE) ×2 IMPLANT
TOWEL GREEN STERILE FF (TOWEL DISPOSABLE) ×2 IMPLANT

## 2021-04-30 NOTE — H&P (Signed)
[name] is an 70 y.o. female.   Chief Complaint: port in place  HPI: pt presents for port removal   Past Medical History:  Diagnosis Date  . Anxiety   . Atrial fibrillation [facility])    sees Dr. Audie Box  . Cancer [facility])    breast  . Chronic pain   . Concussion 3/08   ICU x 3 days  . Depression   . Hypertension   . Hypothyroidism   . Spinal stenosis   . Thyroid disease ?1994    Past Surgical History:  Procedure Laterality Date  . BREAST LUMPECTOMY WITH RADIOACTIVE SEED AND SENTINEL LYMPH NODE BIOPSY Right 01/22/2020   Procedure: RIGHT BREAST LUMPECTOMY WITH RADIOACTIVE SEED X2 AND RIGHT SENTINEL LYMPH NODE MAPPING;  Surgeon: Erroll Luna, MD;  Location: [place];  Service: General;  Laterality: Right;  . BREAST SURGERY Right 4/99   breast biopsy, benign  . EYE SURGERY Right   . PORTACATH PLACEMENT Right 01/22/2020   Procedure: INSERTION PORT-A-CATH WITH ULTRASOUND;  Surgeon: Erroll Luna, MD;  Location: [place];  Service: General;  Laterality: Right;  . PORTACATH PLACEMENT Right 02/28/2020   Procedure: PORT A CATH REVISION;  Surgeon: Erroll Luna, MD;  Location: [place];  Service: General;  Laterality: Right;  . REVERSE SHOULDER ARTHROPLASTY Left 02/02/2021   Procedure: LEFT REVERSE SHOULDER ARTHROPLASTY;  Surgeon: Meredith Pel, MD;  Location: [place];  Service: Orthopedics;  Laterality: Left;  . SHOULDER SURGERY Right   . SPINAL FUSION  03/04/11   with ORIF    Family History  Problem Relation Age of Onset  . Thyroid disease Mother   . Dementia Mother   . Diabetes Father   . Stroke Father   . Hypertension Sister   . Diabetes Sister    Social History:  reports that she has quit smoking. Her smoking use included cigarettes. She quit after 20.00 years of use. She has never used smokeless tobacco. She reports current alcohol use of about 5.0 standard drinks of alcohol per week. She reports that she does not use drugs.  Allergies:   Allergies  Allergen Reactions  . Ambien [Zolpidem Tartrate] Other (See Comments)    Hallucinations and Confusion    Medications Prior to Admission  Medication Sig Dispense Refill  . amitriptyline (ELAVIL) 25 MG tablet Take 75 mg by mouth at bedtime.     Marland Kitchen amLODipine (NORVASC) 5 MG tablet Take 7.5 mg by mouth daily in the afternoon.    Marland Kitchen anastrozole (ARIMIDEX) 1 MG tablet Take 1 tablet (1 mg total) by mouth daily. 90 tablet 3  . apixaban (ELIQUIS) 5 MG TABS tablet Take 1 tablet (5 mg total) by mouth 2 (two) times daily. 60 tablet 2  . bismuth subsalicylate (PEPTO BISMOL) 262 MG chewable tablet Chew 524 mg by mouth as needed.    . diazepam (VALIUM) 10 MG tablet 1 tablet as needed    . furosemide (LASIX) 20 MG tablet Take 20 mg by mouth daily. For HTN    . levothyroxine (SYNTHROID) 150 MCG tablet Take 150 mcg by mouth daily in the afternoon.    . metoprolol tartrate (LOPRESSOR) 25 MG tablet Take 1 tablet (25 mg total) by mouth 2 (two) times daily. 180 tablet 3  . traMADol (ULTRAM) 50 MG tablet Take 1 tablet (50 mg total) by mouth every 8 (eight) hours as needed. 30 tablet 0  . venlafaxine XR (EFFEXOR-XR) 150 MG 24 hr capsule Take 150 mg by  mouth daily in the afternoon.    . vitamin B-12 (CYANOCOBALAMIN) 1000 MCG tablet Take 1,000 mcg by mouth daily in the afternoon.    . celecoxib (CELEBREX) 100 MG capsule Take 1 capsule (100 mg total) by mouth 2 (two) times daily. 60 capsule 0  . lidocaine-prilocaine (EMLA) cream Apply to affected area once (Patient not taking: Reported on 03/26/2021) 30 g 3  . methocarbamol (ROBAXIN) 500 MG tablet Take 1 tablet (500 mg total) by mouth every 8 (eight) hours as needed for muscle spasms. (Patient not taking: Reported on 03/26/2021) 30 tablet 0  . saccharomyces boulardii (FLORASTOR) 250 MG capsule Take 1 capsule (250 mg total) by mouth 2 (two) times daily. (Patient not taking: Reported on 03/26/2021) 60 capsule 6    No results found for this or any previous visit  (from the past 48 hour(s)). No results found.  Review of Systems  All other systems reviewed and are negative.   Blood pressure (!) 178/86, pulse 84, temperature 98.4 F (36.9 C), temperature source Oral, resp. rate 16, height 5\' 7"  (1.702 m), weight 104.3 kg, SpO2 99 %. Physical Exam Constitutional:      Appearance: Normal appearance.  HENT:     Nose: Nose normal.  Eyes:     Pupils: Pupils are equal, round, and reactive to light.  Cardiovascular:     Rate and Rhythm: Normal rate and regular rhythm.  Pulmonary:     Effort: Pulmonary effort is normal.     Breath sounds: Normal breath sounds.  Chest:    Abdominal:     General: Abdomen is flat.  Musculoskeletal:     Cervical back: Normal range of motion.  Neurological:     Mental Status: She is alert and oriented to person, place, and time.     GCS: GCS eye subscore is 4. GCS verbal subscore is 5. GCS motor subscore is 6.      Assessment/Plan Port in place  The procedure has been discussed with the patient.  Alternative therapies have been discussed with the patient.  Operative risks include bleeding,  Infection,  Organ injury,  Nerve injury,  Blood vessel injury,  DVT,  Pulmonary embolism,  Death,  And possible reoperation.  Medical management risks include worsening of present situation.  The success of the procedure is 50 -90 % at treating patients symptoms.  The patient understands and agrees to proceed.  Turner Daniels, MD 04/30/2021, 1:12 PM

## 2021-04-30 NOTE — Interval H&P Note (Signed)
History and Physical Interval Note:  04/30/2021 1:14 PM  [name]  has presented today for surgery, with the diagnosis of PORT.  The various methods of treatment have been discussed with the patient and family. After consideration of risks, benefits and other options for treatment, the patient has consented to  Procedure(s): REMOVAL PORT-A-CATH (N/A) as a surgical intervention.  The patient's history has been reviewed, patient examined, no change in status, stable for surgery.  I have reviewed the patient's chart and labs.  Questions were answered to the patient's satisfaction.     [place]

## 2021-04-30 NOTE — Transfer of Care (Signed)
Immediate Anesthesia Transfer of Care Note  Patient: [name]  Procedure(s) Performed: REMOVAL PORT-A-CATH (Right Chest)  Patient Location: PACU  Anesthesia Type:MAC  Level of Consciousness: awake, alert  and oriented  Airway & Oxygen Therapy: Patient Spontanous Breathing  Post-op Assessment: Report given to RN and Post -op Vital signs reviewed and stable  Post vital signs: Reviewed and stable  Last Vitals:  Vitals Value Taken Time  BP    Temp    Pulse    Resp    SpO2      Last Pain:  Vitals:   04/30/21 1206  TempSrc: Oral  PainSc: 3       Patients Stated Pain Goal: 3 (77/41/28 7867)  Complications: No complications documented.

## 2021-04-30 NOTE — Op Note (Signed)

## 2021-04-30 NOTE — Discharge Instructions (Signed)
GENERAL SURGERY: POST OP INSTRUCTIONS  ######################################################################  EAT Gradually transition to a high fiber diet with a fiber supplement over the next few weeks after discharge.  Start with a pureed / full liquid diet (see below)  WALK Walk an hour a day.  Control your pain to do that.    CONTROL PAIN Control pain so that you can walk, sleep, tolerate sneezing/coughing, go up/down stairs.  HAVE A BOWEL MOVEMENT DAILY Keep your bowels regular to avoid problems.  OK to try a laxative to override constipation.  OK to use an antidairrheal to slow down diarrhea.  Call if not better after 2 tries  CALL IF YOU HAVE PROBLEMS/CONCERNS Call if you are still struggling despite following these instructions. Call if you have concerns not answered by these instructions  ######################################################################    1. DIET: Follow a light bland diet & liquids the first 24 hours after arrival home, such as soup, liquids, starches, etc.  Be sure to drink plenty of fluids.  Quickly advance to a usual solid diet within a few days.  Avoid fast food or heavy meals as your are more likely to get nauseated or have irregular bowels.  A low-fat, high-fiber diet for the rest of your life is ideal.   2. Take your usually prescribed home medications unless otherwise directed. 3. PAIN CONTROL: a. Pain is best controlled by a usual combination of three different methods TOGETHER: i. Ice/Heat ii. Over the counter pain medication iii. Prescription pain medication b. Most patients will experience some swelling and bruising around the incisions.  Ice packs or heating pads (30-60 minutes up to 6 times a day) will help. Use ice for the first few days to help decrease swelling and bruising, then switch to heat to help relax tight/sore spots and speed recovery.  Some people prefer to use ice alone, heat alone, alternating between ice & heat.   Experiment to what works for you.  Swelling and bruising can take several weeks to resolve.   c. It is helpful to take an over-the-counter pain medication regularly for the first few weeks.  Choose one of the following that works best for you: i. Naproxen (Aleve, etc)  Two 220mg tabs twice a day ii. Ibuprofen (Advil, etc) Three 200mg tabs four times a day (every meal & bedtime) iii. Acetaminophen (Tylenol, etc) 500-650mg four times a day (every meal & bedtime) d. A  prescription for pain medication (such as oxycodone, hydrocodone, etc) should be given to you upon discharge.  Take your pain medication as prescribed.  i. If you are having problems/concerns with the prescription medicine (does not control pain, nausea, vomiting, rash, itching, etc), please call us [phone] to see if we need to switch you to a different pain medicine that will work better for you and/or control your side effect better. ii. If you need a refill on your pain medication, please contact your pharmacy.  They will contact our office to request authorization. Prescriptions will not be filled after 5 pm or on week-ends. 4. Avoid getting constipated.  Between the surgery and the pain medications, it is common to experience some constipation.  Increasing fluid intake and taking a fiber supplement (such as Metamucil, Citrucel, FiberCon, MiraLax, etc) 1-2 times a day regularly will usually help prevent this problem from occurring.  A mild laxative (prune juice, Milk of Magnesia, MiraLax, etc) should be taken according to package directions if there are no bowel movements after 48 hours.   5. Wash /   shower every day.  You may shower over the dressings as they are waterproof.  Continue to shower over incision(s) after the dressing is off. 6. Remove your waterproof bandages 5 days after surgery.  You may leave the incision open to air.  You may have skin tapes (Steri Strips) covering the incision(s).  Leave them on until one week, then  remove.  You may replace a dressing/Band-Aid to cover the incision for comfort if you wish.      7. ACTIVITIES as tolerated:   a. You may resume regular (light) daily activities beginning the next day--such as daily self-care, walking, climbing stairs--gradually increasing activities as tolerated.  If you can walk 30 minutes without difficulty, it is safe to try more intense activity such as jogging, treadmill, bicycling, low-impact aerobics, swimming, etc. b. Save the most intensive and strenuous activity for last such as sit-ups, heavy lifting, contact sports, etc  Refrain from any heavy lifting or straining until you are off narcotics for pain control.   c. DO NOT PUSH THROUGH PAIN.  Let pain be your guide: If it hurts to do something, don't do it.  Pain is your body warning you to avoid that activity for another week until the pain goes down. d. You may drive when you are no longer taking prescription pain medication, you can comfortably wear a seatbelt, and you can safely maneuver your car and apply brakes. e. Dennis Bast may have sexual intercourse when it is comfortable.  8. FOLLOW UP in our office a. Please call [facility] at [phone] to set up an appointment to see your surgeon in the office for a follow-up appointment approximately 2-3 weeks after your surgery. b. Make sure that you call for this appointment the day you arrive home to insure a convenient appointment time. 9. IF YOU HAVE DISABILITY OR FAMILY LEAVE FORMS, BRING THEM TO THE OFFICE FOR PROCESSING.  DO NOT GIVE THEM TO YOUR DOCTOR.   WHEN TO CALL us [phone]: 1. Poor pain control 2. Reactions / problems with new medications (rash/itching, nausea, etc)  3. Fever over 101.5 F (38.5 C) 4. Worsening swelling or bruising 5. Continued bleeding from incision. 6. Increased pain, redness, or drainage from the incision 7. Difficulty breathing / swallowing   The clinic staff is available to answer your questions during regular  business hours (8:30am-5pm).  Please don't hesitate to call and ask to speak to one of our nurses for clinical concerns.   If you have a medical emergency, go to the nearest emergency room or call 911.  A surgeon from [facility] is always on call at the [facility], [place], [place], [place], [place]  [id number] ? MAIN: [phone] ? TOLL FREE: [phone] ?  FAX [phone] [email or web address]  Restart eloquis in 48 hours    Post Anesthesia Home Care Instructions  Activity: Get plenty of rest for the remainder of the day. A responsible individual must stay with you for 24 hours following the procedure.  For the next 24 hours, DO NOT: -Drive a car -Paediatric nurse -Drink alcoholic beverages -Take any medication unless instructed by your physician -Make any legal decisions or sign important papers.  Meals: Start with liquid foods such as gelatin or soup. Progress to regular foods as tolerated. Avoid greasy, spicy, heavy foods. If nausea and/or vomiting occur, drink only clear liquids until the nausea and/or vomiting subsides. Call your physician if vomiting continues.  Special Instructions/Symptoms:  Your throat may feel dry or sore from the anesthesia or the breathing tube placed in your throat during surgery. If this causes discomfort, gargle with warm salt water. The discomfort should disappear within 24 hours.  If you had a scopolamine patch placed behind your ear for the management of post- operative nausea and/or vomiting:  1. The medication in the patch is effective for 72 hours, after which it should be removed.  Wrap patch in a tissue and discard in the trash. Wash hands thoroughly with soap and water. 2. You may remove the patch earlier than 72 hours if you experience unpleasant side effects which may include dry mouth, dizziness or visual disturbances. 3. Avoid touching the patch. Wash your hands  with soap and water after contact with the patch.

## 2021-04-30 NOTE — Anesthesia Postprocedure Evaluation (Signed)
Anesthesia Post Note  Patient: [name]  Procedure(s) Performed: REMOVAL PORT-A-CATH (Right Chest)     Patient location during evaluation: PACU Anesthesia Type: MAC Level of consciousness: awake and alert Pain management: pain level controlled Vital Signs Assessment: post-procedure vital signs reviewed and stable Respiratory status: spontaneous breathing, nonlabored ventilation, respiratory function stable and patient connected to nasal cannula oxygen Cardiovascular status: stable and blood pressure returned to baseline Postop Assessment: no apparent nausea or vomiting Anesthetic complications: no   No complications documented.  Last Vitals:  Vitals:   04/30/21 1415 04/30/21 1438  BP: (!) 156/81 (!) 152/82  Pulse: 69 74  Resp: 19 18  Temp:  (!) 36.1 C  SpO2: 96% 96%    Last Pain:  Vitals:   04/30/21 1438  TempSrc:   PainSc: 2                  Barnet Glasgow

## 2021-05-01 ENCOUNTER — Encounter (HOSPITAL_BASED_OUTPATIENT_CLINIC_OR_DEPARTMENT_OTHER): Payer: Self-pay | Admitting: Surgery

## 2021-05-03 ENCOUNTER — Other Ambulatory Visit: Payer: Self-pay | Admitting: Surgical

## 2021-05-04 NOTE — Telephone Encounter (Signed)
Pls advise.  

## 2021-05-05 ENCOUNTER — Encounter: Payer: Medicare PPO | Admitting: Physical Therapy

## 2021-05-05 ENCOUNTER — Encounter: Payer: Self-pay | Admitting: Physical Therapy

## 2021-05-05 ENCOUNTER — Other Ambulatory Visit: Payer: Self-pay

## 2021-05-05 ENCOUNTER — Ambulatory Visit (INDEPENDENT_AMBULATORY_CARE_PROVIDER_SITE_OTHER): Payer: Medicare PPO | Admitting: Physical Therapy

## 2021-05-05 DIAGNOSIS — M25612 Stiffness of left shoulder, not elsewhere classified: Secondary | ICD-10-CM

## 2021-05-05 DIAGNOSIS — R262 Difficulty in walking, not elsewhere classified: Secondary | ICD-10-CM | POA: Diagnosis not present

## 2021-05-05 DIAGNOSIS — R293 Abnormal posture: Secondary | ICD-10-CM

## 2021-05-05 DIAGNOSIS — M6281 Muscle weakness (generalized): Secondary | ICD-10-CM | POA: Diagnosis not present

## 2021-05-05 DIAGNOSIS — R600 Localized edema: Secondary | ICD-10-CM | POA: Diagnosis not present

## 2021-05-05 DIAGNOSIS — M25512 Pain in left shoulder: Secondary | ICD-10-CM | POA: Diagnosis not present

## 2021-05-05 DIAGNOSIS — R296 Repeated falls: Secondary | ICD-10-CM | POA: Diagnosis not present

## 2021-05-05 NOTE — Therapy (Signed)
[facility] Physical Therapy [address] [place], [place], [address] Phone: [phone]   Fax:  [phone]  Physical Therapy Treatment  Patient Details  Name: [name] MRN: [id number] Date of Birth: 02-08-1951 Referring Provider (PT): Donella Stade, [place]   Encounter Date: 05/05/2021   PT End of Session - 05/05/21 1452    Visit Number 4    Number of Visits 24    Date for PT Re-Evaluation 06/18/21    Authorization Type [employer]    PT Start Time 1435    PT Stop Time 1515    PT Time Calculation (min) 40 min    Activity Tolerance Patient tolerated treatment well    Behavior During Therapy [facility] for tasks assessed/performed           Past Medical History:  Diagnosis Date  . Anxiety   . Atrial fibrillation [facility])    sees Dr. Audie Box  . Cancer [facility])    breast  . Chronic pain   . Concussion 3/08   ICU x 3 days  . Depression   . Hypertension   . Hypothyroidism   . Spinal stenosis   . Thyroid disease ?1994    Past Surgical History:  Procedure Laterality Date  . BREAST LUMPECTOMY WITH RADIOACTIVE SEED AND SENTINEL LYMPH NODE BIOPSY Right 01/22/2020   Procedure: RIGHT BREAST LUMPECTOMY WITH RADIOACTIVE SEED X2 AND RIGHT SENTINEL LYMPH NODE MAPPING;  Surgeon: Erroll Luna, MD;  Location: [place];  Service: General;  Laterality: Right;  . BREAST SURGERY Right 4/99   breast biopsy, benign  . EYE SURGERY Right   . PORT-A-CATH REMOVAL Right 04/30/2021   Procedure: REMOVAL PORT-A-CATH;  Surgeon: Erroll Luna, MD;  Location: [place];  Service: General;  Laterality: Right;  . PORTACATH PLACEMENT Right 01/22/2020   Procedure: INSERTION PORT-A-CATH WITH ULTRASOUND;  Surgeon: Erroll Luna, MD;  Location: [place];  Service: General;  Laterality: Right;  . PORTACATH PLACEMENT Right 02/28/2020   Procedure: PORT A CATH REVISION;  Surgeon: Erroll Luna, MD;  Location: [place];  Service: General;   Laterality: Right;  . REVERSE SHOULDER ARTHROPLASTY Left 02/02/2021   Procedure: LEFT REVERSE SHOULDER ARTHROPLASTY;  Surgeon: Meredith Pel, MD;  Location: [place];  Service: Orthopedics;  Laterality: Left;  . SHOULDER SURGERY Right   . SPINAL FUSION  03/04/11   with ORIF    There were no vitals filed for this visit.   Subjective Assessment - 05/05/21 1447    Subjective Pt reporting 1/10 pain in left shoulder at rest. Pt reporting pain increases with stretching to 3/10. Pt had right side port-a-cath removed on 04/30/2021.    Pertinent History anxiety, breast cancer with lumpectomy and radioactive seed placement 2021 and recent port removal, chronic pain, concussion 2008, HTN, depression, spinal fusion 2012.    Patient Stated Goals improve function of Lt arm, raise shoulder over her head to do her hair    Currently in Pain? Yes    Pain Score 1     Pain Location Shoulder    Pain Orientation Left    Pain Descriptors / Indicators Sore    Pain Type Surgical pain    Pain Onset More than a month ago              [facility] PT Assessment - 05/05/21 0001      Assessment   Medical Diagnosis Z96.612 (ICD-10-CM) - History of arthroplasty of left shoulder    Referring Provider (PT) Magn[place],  Gerrianne Scale, PA-C    Onset Date/Surgical Date 02/02/21    Hand Dominance Right    Next MD Visit 03/30/21    Prior Therapy none      Precautions   Precautions Fall;Shoulder                         [facility] Adult PT Treatment/Exercise - 05/05/21 0001      Exercises   Exercises Shoulder      Shoulder Exercises: Seated   Retraction Limitations scapular retraction x 10 holding 5 seconds      Shoulder Exercises: ROM/Strengthening   Pendulum 1 minute each: clockwise, counter clockwise, forward/back, side to side      Shoulder Exercises: Stretch   Table Stretch -Flexion Limitations 10 reps x 5 seconds    Table Stretch - External Rotation Limitations 10 reps x 5 seconds    Other Shoulder  Stretches table stretch scaption: 10 reps holding 5 seconds      Modalities   Modalities Vasopneumatic      Vasopneumatic   Number Minutes Vasopneumatic  10 minutes    Vasopnuematic Location  Shoulder    Vasopneumatic Pressure Low    Vasopneumatic Temperature  34      Manual Therapy   Manual therapy comments PROM left shoulder: grade 2 GH mobs                    PT Short Term Goals - 04/28/21 1438      PT SHORT TERM GOAL #1   Title independent with intial HEP    Status On-going      PT SHORT TERM GOAL #2   Title improve Lt shoulder passive ER to 30 deg    Status On-going             PT Long Term Goals - 03/26/21 1423      PT LONG TERM GOAL #1   Title independet with final HEP    Time 12    Period Weeks    Status New    Target Date 06/18/21      PT LONG TERM GOAL #2   Title Lt shoulder active flexion and abduction improved to at least 90 deg in order to perform ADLs    Time 12    Period Weeks    Status New    Target Date 06/18/21      PT LONG TERM GOAL #3   Title report pain < 3/10 with activity for improved function    Time 12    Period Weeks    Status New    Target Date 06/18/21      PT LONG TERM GOAL #4   Title FOTO score improved to 61 for improved function    Time 12    Period Weeks    Status New    Target Date 06/18/21      PT LONG TERM GOAL #5   Title demonstrate at least 3/5 Lt shoulder flex/abduction strength for improved function    Time 12    Period Weeks    Status New    Target Date 06/18/21                 Plan - 05/05/21 1504    Clinical Impression Statement Pt arriving to therapy reporting, "today is not a good day". Pt reporting 1/10 pain in left shoulder. Pt s/p right por-a-cath removal on 04/30/2021. Pt tolerating PROM with limitations to  90 degrees flexion and 90 degress abduction. Pt with incrased sensitivity to left shoulder and pt unable to tolerate STM. I have contacted Dr. Marlou Sa to verify if we can begin  progressing pt with active assissted and acitve exercises and ROM. Continue skilled PT based on MD's recommendations and pt's tolerance.    Personal Factors and Comorbidities Comorbidity 3+    Comorbidities anxiety, breast cancer with lumpectomy and radioactive seed placement 2021 and recent port removal, chronic pain, concussion 2008, HTN, depression, spinal fusion 2012    Examination-Activity Limitations Locomotion Level;Reach Overhead;Dressing;Hygiene/Grooming;Lift;Toileting;Bathing    Examination-Participation Restrictions Cleaning;Meal Prep;Community Activity;Driving;Laundry    Stability/Clinical Decision Making Evolving/Moderate complexity    Rehab Potential Good    PT Frequency 2x / week    PT Duration 12 weeks    PT Treatment/Interventions ADLs/Self Care Home Management;Therapeutic exercise;Patient/family education;Cryotherapy;DME Instruction;Ultrasound;Moist Heat;Functional mobility training;Therapeutic activities;Balance training;Neuromuscular re-education;Manual techniques;Vasopneumatic Device;Taping;Dry needling;Passive range of motion    PT Next Visit Plan awaiting response from Dr Marlou Sa on if we can proress pt more than original referral. PROM: table slides flexion, scaption, ER, scap retraction, see if MD has allowed increased ROM and progress as able    PT Home Exercise Plan Access Code: [email or web address]    Consulted and Agree with Plan of Care Patient           Patient will benefit from skilled therapeutic intervention in order to improve the following deficits and impairments:  Postural dysfunction,Decreased range of motion,Decreased knowledge of precautions,Increased edema,Impaired UE functional use,Decreased strength,Decreased mobility,Decreased balance,Pain  Visit Diagnosis: Acute pain of left shoulder  Stiffness of left shoulder, not elsewhere classified  Abnormal posture  Repeated falls  Muscle weakness (generalized)  Localized edema  Difficulty in walking, not  elsewhere classified     Problem List Patient Active Problem List   Diagnosis Date Noted  . Arthritis of left shoulder region   . S/P reverse total shoulder arthroplasty, 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  . Vitamin D deficiency 01/15/2021  . Vitamin B12 deficiency 01/15/2021  . Primary insomnia 01/15/2021  . Morbid obesity ([place]) 01/15/2021  . Moderate recurrent major depression ([place]) 01/15/2021  . Benzodiazepine dependence ([place]) 01/15/2021  . Port-A-Cath in place 07/22/2020  . Left leg cellulitis 04/17/2020  . Cellulitis of left leg 04/17/2020  . Hypokalemia 04/17/2020  . Macrocytic anemia 04/17/2020  . Anxiety 04/17/2020  . Depression 04/17/2020  . Chronic diarrhea 04/17/2020  . Chemotherapy induced diarrhea 04/17/2020  . Malignant neoplasm of upper-inner quadrant of right breast in female, estrogen receptor positive ([place]) 12/26/2019  . Encephalopathy 02/02/2018  . Hypothyroidism 02/02/2018  . AKI (acute kidney injury) ([place]) 02/02/2018  . Chronic back pain 02/02/2018  . Alcohol abuse 02/02/2018  . Weight gain 02/02/2018  . Benign essential HTN 02/02/2018    Oretha Caprice, PT, MPT 05/05/2021, 3:21 PM  [facility] Physical Therapy [address] [place], [place], [address] Phone: [phone]   Fax:  [phone]  Name: [name] MRN: [id number] Date of Birth: 09-13-1951

## 2021-05-06 NOTE — Telephone Encounter (Signed)
Recommend discontinuing the celebrex this far out from procedure given she is on eliquis, not good to take NSAIDs with eliquis for long term

## 2021-05-07 ENCOUNTER — Ambulatory Visit (INDEPENDENT_AMBULATORY_CARE_PROVIDER_SITE_OTHER): Payer: Medicare PPO | Admitting: Physical Therapy

## 2021-05-07 ENCOUNTER — Encounter: Payer: Medicare PPO | Admitting: Physical Therapy

## 2021-05-07 ENCOUNTER — Other Ambulatory Visit: Payer: Self-pay

## 2021-05-07 DIAGNOSIS — M25612 Stiffness of left shoulder, not elsewhere classified: Secondary | ICD-10-CM | POA: Diagnosis not present

## 2021-05-07 DIAGNOSIS — M6281 Muscle weakness (generalized): Secondary | ICD-10-CM | POA: Diagnosis not present

## 2021-05-07 DIAGNOSIS — R600 Localized edema: Secondary | ICD-10-CM | POA: Diagnosis not present

## 2021-05-07 DIAGNOSIS — R293 Abnormal posture: Secondary | ICD-10-CM | POA: Diagnosis not present

## 2021-05-07 DIAGNOSIS — R296 Repeated falls: Secondary | ICD-10-CM

## 2021-05-07 DIAGNOSIS — M25512 Pain in left shoulder: Secondary | ICD-10-CM

## 2021-05-07 NOTE — Therapy (Signed)
[facility] Physical Therapy [address] [place], [place], [address] Phone: [phone]   Fax:  [phone]  Physical Therapy Treatment  Patient Details  Name: [name] MRN: [id number] Date of Birth: 04-19-1951 Referring Provider (PT): Donella Stade, [place]   Encounter Date: 05/07/2021   PT End of Session - 05/07/21 1455    Visit Number 5    Number of Visits 24    Date for PT Re-Evaluation 06/18/21    Authorization Type [employer]    PT Start Time 1430    PT Stop Time 1515    PT Time Calculation (min) 45 min    Activity Tolerance Patient tolerated treatment well    Behavior During Therapy [facility] for tasks assessed/performed           Past Medical History:  Diagnosis Date  . Anxiety   . Atrial fibrillation [facility])    sees Dr. Audie Box  . Cancer [facility])    breast  . Chronic pain   . Concussion 3/08   ICU x 3 days  . Depression   . Hypertension   . Hypothyroidism   . Spinal stenosis   . Thyroid disease ?1994    Past Surgical History:  Procedure Laterality Date  . BREAST LUMPECTOMY WITH RADIOACTIVE SEED AND SENTINEL LYMPH NODE BIOPSY Right 01/22/2020   Procedure: RIGHT BREAST LUMPECTOMY WITH RADIOACTIVE SEED X2 AND RIGHT SENTINEL LYMPH NODE MAPPING;  Surgeon: Erroll Luna, MD;  Location: [place];  Service: General;  Laterality: Right;  . BREAST SURGERY Right 4/99   breast biopsy, benign  . EYE SURGERY Right   . PORT-A-CATH REMOVAL Right 04/30/2021   Procedure: REMOVAL PORT-A-CATH;  Surgeon: Erroll Luna, MD;  Location: [place];  Service: General;  Laterality: Right;  . PORTACATH PLACEMENT Right 01/22/2020   Procedure: INSERTION PORT-A-CATH WITH ULTRASOUND;  Surgeon: Erroll Luna, MD;  Location: [place];  Service: General;  Laterality: Right;  . PORTACATH PLACEMENT Right 02/28/2020   Procedure: PORT A CATH REVISION;  Surgeon: Erroll Luna, MD;  Location: [place];  Service: General;   Laterality: Right;  . REVERSE SHOULDER ARTHROPLASTY Left 02/02/2021   Procedure: LEFT REVERSE SHOULDER ARTHROPLASTY;  Surgeon: Meredith Pel, MD;  Location: [place];  Service: Orthopedics;  Laterality: Left;  . SHOULDER SURGERY Right   . SPINAL FUSION  03/04/11   with ORIF    There were no vitals filed for this visit.   Subjective Assessment - 05/07/21 1438    Subjective Pt reporting 4/10 overall pain today in her Lt shoulder.    Pertinent History anxiety, breast cancer with lumpectomy and radioactive seed placement 2021 and recent port removal, chronic pain, concussion 2008, HTN, depression, spinal fusion 2012.    Patient Stated Goals improve function of Lt arm, raise shoulder over her head to do her hair    Currently in Pain? No/denies    Pain Onset More than a month ago                             [facility] Adult PT Treatment/Exercise - 05/07/21 0001      Shoulder Exercises: Supine   External Rotation AAROM;Left;10 reps    Flexion AAROM;10 reps    ABduction AAROM;10 reps      Shoulder Exercises: Seated   Retraction Limitations scapular retraction x 10 holding 5 seconds    Other Seated Exercises tablestretch ER 5 sec x10  Shoulder Exercises: Standing   Other Standing Exercises standing Pball roll outs with Lt hand on ball into flexion X10 then abd X10      Shoulder Exercises: Pulleys   Flexion 2 minutes    ABduction 2 minutes      Vasopneumatic   Number Minutes Vasopneumatic  10 minutes    Vasopnuematic Location  Shoulder    Vasopneumatic Pressure Low    Vasopneumatic Temperature  34      Manual Therapy   Manual therapy comments PROM left shoulder to tolerance all planes                    PT Short Term Goals - 04/28/21 1438      PT SHORT TERM GOAL #1   Title independent with intial HEP    Status On-going      PT SHORT TERM GOAL #2   Title improve Lt shoulder passive ER to 30 deg    Status On-going             PT Long Term  Goals - 03/26/21 1423      PT LONG TERM GOAL #1   Title independet with final HEP    Time 12    Period Weeks    Status New    Target Date 06/18/21      PT LONG TERM GOAL #2   Title Lt shoulder active flexion and abduction improved to at least 90 deg in order to perform ADLs    Time 12    Period Weeks    Status New    Target Date 06/18/21      PT LONG TERM GOAL #3   Title report pain < 3/10 with activity for improved function    Time 12    Period Weeks    Status New    Target Date 06/18/21      PT LONG TERM GOAL #4   Title FOTO score improved to 61 for improved function    Time 12    Period Weeks    Status New    Target Date 06/18/21      PT LONG TERM GOAL #5   Title demonstrate at least 3/5 Lt shoulder flex/abduction strength for improved function    Time 12    Period Weeks    Status New    Target Date 06/18/21                 Plan - 05/07/21 1458    Clinical Impression Statement Progressed her AAROM and PROM to her tolerance today and she shows good effort with PT but still very limited with functional use of her Lt arm and we will progress this as able now that she is well beyone 8 weeks post op Lt rTSA.    Personal Factors and Comorbidities Comorbidity 3+    Comorbidities anxiety, breast cancer with lumpectomy and radioactive seed placement 2021 and recent port removal, chronic pain, concussion 2008, HTN, depression, spinal fusion 2012    Examination-Activity Limitations Locomotion Level;Reach Overhead;Dressing;Hygiene/Grooming;Lift;Toileting;Bathing    Examination-Participation Restrictions Cleaning;Meal Prep;Community Activity;Driving;Laundry    Stability/Clinical Decision Making Evolving/Moderate complexity    Rehab Potential Good    PT Frequency 2x / week    PT Duration 12 weeks    PT Treatment/Interventions ADLs/Self Care Home Management;Therapeutic exercise;Patient/family education;Cryotherapy;DME Instruction;Ultrasound;Moist Heat;Functional mobility  training;Therapeutic activities;Balance training;Neuromuscular re-education;Manual techniques;Vasopneumatic Device;Taping;Dry needling;Passive range of motion    PT Next Visit Plan MD replied back that it is ok  to move beyond past 90 deg when she is past 8 weeks post op, and she is well beyond this so progress AAROM and PROM to her tolerance    PT Home Exercise Plan Access Code: [email or web address]    Consulted and Agree with Plan of Care Patient           Patient will benefit from skilled therapeutic intervention in order to improve the following deficits and impairments:  Postural dysfunction,Decreased range of motion,Decreased knowledge of precautions,Increased edema,Impaired UE functional use,Decreased strength,Decreased mobility,Decreased balance,Pain  Visit Diagnosis: Acute pain of left shoulder  Stiffness of left shoulder, not elsewhere classified  Abnormal posture  Repeated falls  Muscle weakness (generalized)  Localized edema     Problem List Patient Active Problem List   Diagnosis Date Noted  . Arthritis of left shoulder region   . S/P reverse total shoulder arthroplasty, 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  . Vitamin D deficiency 01/15/2021  . Vitamin B12 deficiency 01/15/2021  . Primary insomnia 01/15/2021  . Morbid obesity ([place]) 01/15/2021  . Moderate recurrent major depression ([place]) 01/15/2021  . Benzodiazepine dependence ([place]) 01/15/2021  . Port-A-Cath in place 07/22/2020  . Left leg cellulitis 04/17/2020  . Cellulitis of left leg 04/17/2020  . Hypokalemia 04/17/2020  . Macrocytic anemia 04/17/2020  . Anxiety 04/17/2020  . Depression 04/17/2020  . Chronic diarrhea 04/17/2020  . Chemotherapy induced diarrhea 04/17/2020  . Malignant neoplasm of upper-inner quadrant of right breast in female, estrogen receptor positive ([place]) 12/26/2019  . Encephalopathy 02/02/2018  . Hypothyroidism 02/02/2018  . AKI (acute kidney injury) ([place]) 02/02/2018  . Chronic back pain 02/02/2018  . Alcohol  abuse 02/02/2018  . Weight gain 02/02/2018  . Benign essential HTN 02/02/2018    Silvestre Mesi 05/07/2021, 3:05 PM  [facility] Physical Therapy [address] [place], [place], [address] Phone: [phone]   Fax:  [phone]  Name: [name] MRN: [id number] Date of Birth: 04-Sep-1951

## 2021-05-12 ENCOUNTER — Other Ambulatory Visit: Payer: Self-pay

## 2021-05-12 ENCOUNTER — Encounter: Payer: Self-pay | Admitting: Physical Therapy

## 2021-05-12 ENCOUNTER — Encounter: Payer: Medicare PPO | Admitting: Physical Therapy

## 2021-05-12 ENCOUNTER — Ambulatory Visit (INDEPENDENT_AMBULATORY_CARE_PROVIDER_SITE_OTHER): Payer: Medicare PPO | Admitting: Physical Therapy

## 2021-05-12 DIAGNOSIS — R293 Abnormal posture: Secondary | ICD-10-CM

## 2021-05-12 DIAGNOSIS — M25612 Stiffness of left shoulder, not elsewhere classified: Secondary | ICD-10-CM

## 2021-05-12 DIAGNOSIS — M6281 Muscle weakness (generalized): Secondary | ICD-10-CM

## 2021-05-12 DIAGNOSIS — R296 Repeated falls: Secondary | ICD-10-CM

## 2021-05-12 DIAGNOSIS — R262 Difficulty in walking, not elsewhere classified: Secondary | ICD-10-CM | POA: Diagnosis not present

## 2021-05-12 DIAGNOSIS — M25512 Pain in left shoulder: Secondary | ICD-10-CM | POA: Diagnosis not present

## 2021-05-12 DIAGNOSIS — R600 Localized edema: Secondary | ICD-10-CM | POA: Diagnosis not present

## 2021-05-12 NOTE — Therapy (Signed)
[facility] Physical Therapy [address] [place], [place], [address] Phone: [phone]   Fax:  [phone]  Physical Therapy Treatment  Patient Details  Name: [name] MRN: [id number] Date of Birth: Oct 25, 1951 Referring Provider (PT): Donella Stade, [place]   Encounter Date: 05/12/2021   PT End of Session - 05/12/21 1503    Visit Number 6    Number of Visits 24    Date for PT Re-Evaluation 06/18/21    PT Start Time 1430    PT Stop Time 1515    PT Time Calculation (min) 45 min    Activity Tolerance Patient tolerated treatment well    Behavior During Therapy [facility] for tasks assessed/performed           Past Medical History:  Diagnosis Date  . Anxiety   . Atrial fibrillation [facility])    sees Dr. Audie Box  . Cancer [facility])    breast  . Chronic pain   . Concussion 3/08   ICU x 3 days  . Depression   . Hypertension   . Hypothyroidism   . Spinal stenosis   . Thyroid disease ?1994    Past Surgical History:  Procedure Laterality Date  . BREAST LUMPECTOMY WITH RADIOACTIVE SEED AND SENTINEL LYMPH NODE BIOPSY Right 01/22/2020   Procedure: RIGHT BREAST LUMPECTOMY WITH RADIOACTIVE SEED X2 AND RIGHT SENTINEL LYMPH NODE MAPPING;  Surgeon: Erroll Luna, MD;  Location: [place];  Service: General;  Laterality: Right;  . BREAST SURGERY Right 4/99   breast biopsy, benign  . EYE SURGERY Right   . PORT-A-CATH REMOVAL Right 04/30/2021   Procedure: REMOVAL PORT-A-CATH;  Surgeon: Erroll Luna, MD;  Location: [place];  Service: General;  Laterality: Right;  . PORTACATH PLACEMENT Right 01/22/2020   Procedure: INSERTION PORT-A-CATH WITH ULTRASOUND;  Surgeon: Erroll Luna, MD;  Location: [place];  Service: General;  Laterality: Right;  . PORTACATH PLACEMENT Right 02/28/2020   Procedure: PORT A CATH REVISION;  Surgeon: Erroll Luna, MD;  Location: [place];  Service: General;  Laterality: Right;  . REVERSE  SHOULDER ARTHROPLASTY Left 02/02/2021   Procedure: LEFT REVERSE SHOULDER ARTHROPLASTY;  Surgeon: Meredith Pel, MD;  Location: [place];  Service: Orthopedics;  Laterality: Left;  . SHOULDER SURGERY Right   . SPINAL FUSION  03/04/11   with ORIF    There were no vitals filed for this visit.   Subjective Assessment - 05/12/21 1510    Subjective Pt arriving today reporting 1/10 pain at rest. Pt reporting pain can increase with certain activities. Pt reporting having diffculty with the "broom" exercises at home especially with external rotation.    Pertinent History anxiety, breast cancer with lumpectomy and radioactive seed placement 2021 and recent port removal, chronic pain, concussion 2008, HTN, depression, spinal fusion 2012.    Patient Stated Goals improve function of Lt arm, raise shoulder over her head to do her hair    Currently in Pain? Yes    Pain Score 1     Pain Location Shoulder    Pain Orientation Left    Pain Descriptors / Indicators Sore    Pain Type Surgical pain    Pain Onset More than a month ago                             [facility] Adult PT Treatment/Exercise - 05/12/21 0001      Shoulder Exercises: Supine   External  Rotation AAROM;Left;15 reps    Flexion AAROM;15 reps    ABduction AAROM;10 reps      Shoulder Exercises: Seated   Retraction Limitations scapular retraction x 10 holding 5 seconds    Other Seated Exercises tablestretch ER 5 sec x10    Other Seated Exercises UR ranger; flexion, scaption, circles both directions x 1 minute each      Shoulder Exercises: Pulleys   Flexion 2 minutes    ABduction 2 minutes      Modalities   Modalities Vasopneumatic      Vasopneumatic   Number Minutes Vasopneumatic  10 minutes    Vasopnuematic Location  Shoulder    Vasopneumatic Pressure Low    Vasopneumatic Temperature  34      Manual Therapy   Manual therapy comments PROM left shoulder to tolerance all planes                    PT  Short Term Goals - 05/12/21 1502      PT SHORT TERM GOAL #1   Title independent with intial HEP    Status On-going      PT SHORT TERM GOAL #2   Title improve Lt shoulder passive ER to 30 deg    Baseline passive ER left shoulder 40 degrees    Status Achieved             PT Long Term Goals - 05/12/21 1502      PT LONG TERM GOAL #1   Title independet with final HEP    Status On-going      PT LONG TERM GOAL #2   Title Lt shoulder active flexion and abduction improved to at least 90 deg in order to perform ADLs    Status On-going      PT LONG TERM GOAL #3   Title report pain < 3/10 with activity for improved function    Status On-going      PT LONG TERM GOAL #4   Title FOTO score improved to 61 for improved function    Status On-going      PT LONG TERM GOAL #5   Title demonstrate at least 3/5 Lt shoulder flex/abduction strength for improved function    Status On-going                 Plan - 05/12/21 1443    Clinical Impression Statement Pt tolerating exercises well. Pt still presenting with functional limitations of her L UE. Progressing with PROM and AAROM.  Pt reporting 1/10 pain at rest and 3/10 pain with movements. MD sent message to progress pt past 90 degrees when pt is post op 8 weeks and pt is over 12 weeks out from TSA.  Continue skilled PT.    Personal Factors and Comorbidities Comorbidity 3+    Comorbidities anxiety, breast cancer with lumpectomy and radioactive seed placement 2021 and recent port removal, chronic pain, concussion 2008, HTN, depression, spinal fusion 2012    Examination-Activity Limitations Locomotion Level;Reach Overhead;Dressing;Hygiene/Grooming;Lift;Toileting;Bathing    Examination-Participation Restrictions Cleaning;Meal Prep;Community Activity;Driving;Laundry    Stability/Clinical Decision Making Evolving/Moderate complexity    Rehab Potential Good    PT Frequency 2x / week    PT Duration 12 weeks    PT Treatment/Interventions  ADLs/Self Care Home Management;Therapeutic exercise;Patient/family education;Cryotherapy;DME Instruction;Ultrasound;Moist Heat;Functional mobility training;Therapeutic activities;Balance training;Neuromuscular re-education;Manual techniques;Vasopneumatic Device;Taping;Dry needling;Passive range of motion    PT Next Visit Plan MD replied back that it is ok to move beyond past 90 deg when  she is past 8 weeks post op, and she is well beyond this so progress AAROM and PROM to her tolerance    PT Home Exercise Plan Access Code: [email or web address]    Consulted and Agree with Plan of Care Patient           Patient will benefit from skilled therapeutic intervention in order to improve the following deficits and impairments:  Postural dysfunction,Decreased range of motion,Decreased knowledge of precautions,Increased edema,Impaired UE functional use,Decreased strength,Decreased mobility,Decreased balance,Pain  Visit Diagnosis: Acute pain of left shoulder  Abnormal posture  Stiffness of left shoulder, not elsewhere classified  Repeated falls  Muscle weakness (generalized)  Localized edema  Difficulty in walking, not elsewhere classified     Problem List Patient Active Problem List   Diagnosis Date Noted  . Arthritis of left shoulder region   . S/P reverse total shoulder arthroplasty, 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  . Vitamin D deficiency 01/15/2021  . Vitamin B12 deficiency 01/15/2021  . Primary insomnia 01/15/2021  . Morbid obesity ([place]) 01/15/2021  . Moderate recurrent major depression ([place]) 01/15/2021  . Benzodiazepine dependence ([place]) 01/15/2021  . Port-A-Cath in place 07/22/2020  . Left leg cellulitis 04/17/2020  . Cellulitis of left leg 04/17/2020  . Hypokalemia 04/17/2020  . Macrocytic anemia 04/17/2020  . Anxiety 04/17/2020  . Depression 04/17/2020  . Chronic diarrhea 04/17/2020  . Chemotherapy induced diarrhea 04/17/2020  . Malignant neoplasm of upper-inner quadrant of right breast in female,  estrogen receptor positive ([place]) 12/26/2019  . Encephalopathy 02/02/2018  . Hypothyroidism 02/02/2018  . AKI (acute kidney injury) ([place]) 02/02/2018  . Chronic back pain 02/02/2018  . Alcohol abuse 02/02/2018  . Weight gain 02/02/2018  . Benign essential HTN 02/02/2018    Oretha Caprice , PT, MPT 05/12/2021, 3:12 PM  [facility] Physical Therapy [address] [place], [place], [address] Phone: [phone]   Fax:  [phone]  Name: SADIYAH KANGAS MRN: [id number] Date of Birth: April 18, 1951

## 2021-05-14 ENCOUNTER — Ambulatory Visit (INDEPENDENT_AMBULATORY_CARE_PROVIDER_SITE_OTHER): Payer: Medicare PPO | Admitting: Physical Therapy

## 2021-05-14 ENCOUNTER — Other Ambulatory Visit: Payer: Self-pay

## 2021-05-14 ENCOUNTER — Encounter: Payer: Medicare PPO | Admitting: Physical Therapy

## 2021-05-14 DIAGNOSIS — R296 Repeated falls: Secondary | ICD-10-CM

## 2021-05-14 DIAGNOSIS — R293 Abnormal posture: Secondary | ICD-10-CM

## 2021-05-14 DIAGNOSIS — R600 Localized edema: Secondary | ICD-10-CM

## 2021-05-14 DIAGNOSIS — M6281 Muscle weakness (generalized): Secondary | ICD-10-CM

## 2021-05-14 DIAGNOSIS — M25612 Stiffness of left shoulder, not elsewhere classified: Secondary | ICD-10-CM | POA: Diagnosis not present

## 2021-05-14 DIAGNOSIS — M25512 Pain in left shoulder: Secondary | ICD-10-CM

## 2021-05-14 NOTE — Therapy (Signed)
[facility] Physical Therapy [address] [place], [place], [address] Phone: [phone]   Fax:  [phone]  Physical Therapy Treatment  Patient Details  Name: [name] MRN: [id number] Date of Birth: 05-25-1951 Referring Provider (PT): Donella Stade, [place]   Encounter Date: 05/14/2021   PT End of Session - 05/14/21 1506    Visit Number 7    Number of Visits 24    Date for PT Re-Evaluation 06/18/21    Authorization Type [employer]    PT Start Time 1430    PT Stop Time 1513    PT Time Calculation (min) 43 min    Activity Tolerance Patient tolerated treatment well    Behavior During Therapy [facility] for tasks assessed/performed           Past Medical History:  Diagnosis Date  . Anxiety   . Atrial fibrillation [facility])    sees Dr. Audie Box  . Cancer [facility])    breast  . Chronic pain   . Concussion 3/08   ICU x 3 days  . Depression   . Hypertension   . Hypothyroidism   . Spinal stenosis   . Thyroid disease ?1994    Past Surgical History:  Procedure Laterality Date  . BREAST LUMPECTOMY WITH RADIOACTIVE SEED AND SENTINEL LYMPH NODE BIOPSY Right 01/22/2020   Procedure: RIGHT BREAST LUMPECTOMY WITH RADIOACTIVE SEED X2 AND RIGHT SENTINEL LYMPH NODE MAPPING;  Surgeon: Erroll Luna, MD;  Location: [place];  Service: General;  Laterality: Right;  . BREAST SURGERY Right 4/99   breast biopsy, benign  . EYE SURGERY Right   . PORT-A-CATH REMOVAL Right 04/30/2021   Procedure: REMOVAL PORT-A-CATH;  Surgeon: Erroll Luna, MD;  Location: [place];  Service: General;  Laterality: Right;  . PORTACATH PLACEMENT Right 01/22/2020   Procedure: INSERTION PORT-A-CATH WITH ULTRASOUND;  Surgeon: Erroll Luna, MD;  Location: [place];  Service: General;  Laterality: Right;  . PORTACATH PLACEMENT Right 02/28/2020   Procedure: PORT A CATH REVISION;  Surgeon: Erroll Luna, MD;  Location: [place];  Service: General;   Laterality: Right;  . REVERSE SHOULDER ARTHROPLASTY Left 02/02/2021   Procedure: LEFT REVERSE SHOULDER ARTHROPLASTY;  Surgeon: Meredith Pel, MD;  Location: [place];  Service: Orthopedics;  Laterality: Left;  . SHOULDER SURGERY Right   . SPINAL FUSION  03/04/11   with ORIF    There were no vitals filed for this visit.   Subjective Assessment - 05/14/21 1446    Subjective Pt arriving today reporting her shoulder feels pretty good today overall.    Pertinent History anxiety, breast cancer with lumpectomy and radioactive seed placement 2021 and recent port removal, chronic pain, concussion 2008, HTN, depression, spinal fusion 2012.    Patient Stated Goals improve function of Lt arm, raise shoulder over her head to do her hair    Pain Onset More than a month ago            [facility] Adult PT Treatment/Exercise - 05/14/21 0001      Shoulder Exercises: Supine   Flexion AROM;15 reps      Shoulder Exercises: Seated   Row Both;15 reps    Theraband Level (Shoulder Row) Level 2 (Red)      Shoulder Exercises: Standing   ABduction Both;AROM;10 reps    Other Standing Exercises standing Pball roll outs with Lt hand on ball into flexion X15 then abd X15, then ER X15      Shoulder Exercises: Pulleys  Flexion 3 minutes    ABduction 3 minutes      Vasopneumatic   Number Minutes Vasopneumatic  10 minutes    Vasopnuematic Location  Shoulder    Vasopneumatic Pressure Low    Vasopneumatic Temperature  34      Manual Therapy   Manual therapy comments PROM left shoulder to tolerance all planes                    PT Short Term Goals - 05/12/21 1502      PT SHORT TERM GOAL #1   Title independent with intial HEP    Status On-going      PT SHORT TERM GOAL #2   Title improve Lt shoulder passive ER to 30 deg    Baseline passive ER left shoulder 40 degrees    Status Achieved             PT Long Term Goals - 05/12/21 1502      PT LONG TERM GOAL #1   Title independet with  final HEP    Status On-going      PT LONG TERM GOAL #2   Title Lt shoulder active flexion and abduction improved to at least 90 deg in order to perform ADLs    Status On-going      PT LONG TERM GOAL #3   Title report pain < 3/10 with activity for improved function    Status On-going      PT LONG TERM GOAL #4   Title FOTO score improved to 61 for improved function    Status On-going      PT LONG TERM GOAL #5   Title demonstrate at least 3/5 Lt shoulder flex/abduction strength for improved function    Status On-going                 Plan - 05/14/21 1507    Clinical Impression Statement Continued with gradual activity progress as tolerated with focus on ROM. She will continue to benefit from PT to maximize functional use of Lt UE.    Personal Factors and Comorbidities Comorbidity 3+    Comorbidities anxiety, breast cancer with lumpectomy and radioactive seed placement 2021 and recent port removal, chronic pain, concussion 2008, HTN, depression, spinal fusion 2012    Examination-Activity Limitations Locomotion Level;Reach Overhead;Dressing;Hygiene/Grooming;Lift;Toileting;Bathing    Examination-Participation Restrictions Cleaning;Meal Prep;Community Activity;Driving;Laundry    Stability/Clinical Decision Making Evolving/Moderate complexity    Rehab Potential Good    PT Frequency 2x / week    PT Duration 12 weeks    PT Treatment/Interventions ADLs/Self Care Home Management;Therapeutic exercise;Patient/family education;Cryotherapy;DME Instruction;Ultrasound;Moist Heat;Functional mobility training;Therapeutic activities;Balance training;Neuromuscular re-education;Manual techniques;Vasopneumatic Device;Taping;Dry needling;Passive range of motion    PT Next Visit Plan progress AAROM and PROM to her tolerance working toward beginner AROM    PT Home Exercise Plan Access Code: [email or web address]    Consulted and Agree with Plan of Care Patient           Patient will benefit from skilled  therapeutic intervention in order to improve the following deficits and impairments:  Postural dysfunction,Decreased range of motion,Decreased knowledge of precautions,Increased edema,Impaired UE functional use,Decreased strength,Decreased mobility,Decreased balance,Pain  Visit Diagnosis: Acute pain of left shoulder  Abnormal posture  Stiffness of left shoulder, not elsewhere classified  Repeated falls  Muscle weakness (generalized)  Localized edema     Problem List Patient Active Problem List   Diagnosis Date Noted  . Arthritis of left shoulder region   . S/P reverse total  shoulder arthroplasty, 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  . Vitamin D deficiency 01/15/2021  . Vitamin B12 deficiency 01/15/2021  . Primary insomnia 01/15/2021  . Morbid obesity ([place]) 01/15/2021  . Moderate recurrent major depression ([place]) 01/15/2021  . Benzodiazepine dependence ([place]) 01/15/2021  . Port-A-Cath in place 07/22/2020  . Left leg cellulitis 04/17/2020  . Cellulitis of left leg 04/17/2020  . Hypokalemia 04/17/2020  . Macrocytic anemia 04/17/2020  . Anxiety 04/17/2020  . Depression 04/17/2020  . Chronic diarrhea 04/17/2020  . Chemotherapy induced diarrhea 04/17/2020  . Malignant neoplasm of upper-inner quadrant of right breast in female, estrogen receptor positive ([place]) 12/26/2019  . Encephalopathy 02/02/2018  . Hypothyroidism 02/02/2018  . AKI (acute kidney injury) ([place]) 02/02/2018  . Chronic back pain 02/02/2018  . Alcohol abuse 02/02/2018  . Weight gain 02/02/2018  . Benign essential HTN 02/02/2018    Silvestre Mesi 05/14/2021, 3:08 PM  [facility] Physical Therapy [address] [place], [place], [address] Phone: [phone]   Fax:  [phone]  Name: [name] MRN: [id number] Date of Birth: 1951-09-09

## 2021-05-19 ENCOUNTER — Encounter: Payer: Medicare PPO | Admitting: Physical Therapy

## 2021-05-19 ENCOUNTER — Telehealth: Payer: Self-pay | Admitting: Hematology and Oncology

## 2021-05-19 ENCOUNTER — Other Ambulatory Visit: Payer: Self-pay | Admitting: Hematology and Oncology

## 2021-05-19 NOTE — Telephone Encounter (Signed)
R/s per prov PAL, per 7/18 los, pt aware

## 2021-05-21 ENCOUNTER — Encounter: Payer: Self-pay | Admitting: Hematology and Oncology

## 2021-05-21 ENCOUNTER — Encounter: Payer: Medicare PPO | Admitting: Physical Therapy

## 2021-05-27 ENCOUNTER — Ambulatory Visit (INDEPENDENT_AMBULATORY_CARE_PROVIDER_SITE_OTHER): Payer: Medicare PPO | Admitting: Physical Therapy

## 2021-05-27 ENCOUNTER — Other Ambulatory Visit: Payer: Self-pay

## 2021-05-27 ENCOUNTER — Other Ambulatory Visit: Payer: Self-pay | Admitting: Surgical

## 2021-05-27 ENCOUNTER — Encounter: Payer: Self-pay | Admitting: Physical Therapy

## 2021-05-27 DIAGNOSIS — M25612 Stiffness of left shoulder, not elsewhere classified: Secondary | ICD-10-CM | POA: Diagnosis not present

## 2021-05-27 DIAGNOSIS — M6281 Muscle weakness (generalized): Secondary | ICD-10-CM

## 2021-05-27 DIAGNOSIS — M25512 Pain in left shoulder: Secondary | ICD-10-CM

## 2021-05-27 DIAGNOSIS — R296 Repeated falls: Secondary | ICD-10-CM | POA: Diagnosis not present

## 2021-05-27 DIAGNOSIS — R293 Abnormal posture: Secondary | ICD-10-CM

## 2021-05-27 NOTE — Therapy (Signed)
[facility] [address] [place], [place], [address] Phone: [phone]   Fax:  [phone]  Physical Therapy Treatment  Patient Details  Name: [name] MRN: [id number] Date of Birth: 1951/02/07 Referring Provider (PT): Donella Stade, [place]   Encounter Date: 05/27/2021   PT End of Session - 05/27/21 1600    Visit Number 8    Number of Visits 24    Date for PT Re-Evaluation 06/18/21    Authorization Type [employer]    Progress Note Due on Visit 10    PT Start Time 1515    PT Stop Time 1603    PT Time Calculation (min) 48 min    Activity Tolerance Patient tolerated treatment well    Behavior During Therapy [facility] for tasks assessed/performed           Past Medical History:  Diagnosis Date  . Anxiety   . Atrial fibrillation [facility])    sees Dr. Audie Box  . Cancer [facility])    breast  . Chronic pain   . Concussion 3/08   ICU x 3 days  . Depression   . Hypertension   . Hypothyroidism   . Spinal stenosis   . Thyroid disease ?1994    Past Surgical History:  Procedure Laterality Date  . BREAST LUMPECTOMY WITH RADIOACTIVE SEED AND SENTINEL LYMPH NODE BIOPSY Right 01/22/2020   Procedure: RIGHT BREAST LUMPECTOMY WITH RADIOACTIVE SEED X2 AND RIGHT SENTINEL LYMPH NODE MAPPING;  Surgeon: Erroll Luna, MD;  Location: [place];  Service: General;  Laterality: Right;  . BREAST SURGERY Right 4/99   breast biopsy, benign  . EYE SURGERY Right   . PORT-A-CATH REMOVAL Right 04/30/2021   Procedure: REMOVAL PORT-A-CATH;  Surgeon: Erroll Luna, MD;  Location: [place];  Service: General;  Laterality: Right;  . PORTACATH PLACEMENT Right 01/22/2020   Procedure: INSERTION PORT-A-CATH WITH ULTRASOUND;  Surgeon: Erroll Luna, MD;  Location: [place];  Service: General;  Laterality: Right;  . PORTACATH PLACEMENT Right 02/28/2020   Procedure: PORT A CATH REVISION;  Surgeon: Erroll Luna, MD;  Location:  [place];  Service: General;  Laterality: Right;  . REVERSE SHOULDER ARTHROPLASTY Left 02/02/2021   Procedure: LEFT REVERSE SHOULDER ARTHROPLASTY;  Surgeon: Meredith Pel, MD;  Location: [place];  Service: Orthopedics;  Laterality: Left;  . SHOULDER SURGERY Right   . SPINAL FUSION  03/04/11   with ORIF    There were no vitals filed for this visit.   Subjective Assessment - 05/27/21 1557    Subjective Pt arriving today reporting her shoulder feels pretty good but her back is in a lot of pain. She states she has been doing exercises at home but she still cant get her Lt arm up to fix her hair like she would like.    Pertinent History anxiety, breast cancer with lumpectomy and radioactive seed placement 2021 and recent port removal, chronic pain, concussion 2008, HTN, depression, spinal fusion 2012.    Patient Stated Goals improve function of Lt arm, raise shoulder over her head to do her hair    Pain Onset More than a month ago            [facility] Adult PT Treatment/Exercise - 05/27/21 0001      Shoulder Exercises: Seated   Other Seated Exercises tablestretch ER, flexion, abd  5 sec x10ea      Shoulder Exercises: Standing   Other Standing Exercises UE ranger in flexion X 10  reps and circles X10 reps      Shoulder Exercises: Pulleys   Flexion 3 minutes    ABduction 3 minutes      Vasopneumatic   Number Minutes Vasopneumatic  10 minutes    Vasopnuematic Location  Shoulder    Vasopneumatic Pressure Low    Vasopneumatic Temperature  34      Manual Therapy   Manual therapy comments PROM left shoulder to tolerance all planes                    PT Short Term Goals - 05/12/21 1502      PT SHORT TERM GOAL #1   Title independent with intial HEP    Status On-going      PT SHORT TERM GOAL #2   Title improve Lt shoulder passive ER to 30 deg    Baseline passive ER left shoulder 40 degrees    Status Achieved             PT Long Term Goals - 05/12/21 1502      PT LONG  TERM GOAL #1   Title independet with final HEP    Status On-going      PT LONG TERM GOAL #2   Title Lt shoulder active flexion and abduction improved to at least 90 deg in order to perform ADLs    Status On-going      PT LONG TERM GOAL #3   Title report pain < 3/10 with activity for improved function    Status On-going      PT LONG TERM GOAL #4   Title FOTO score improved to 61 for improved function    Status On-going      PT LONG TERM GOAL #5   Title demonstrate at least 3/5 Lt shoulder flex/abduction strength for improved function    Status On-going                 Plan - 05/27/21 1602    Clinical Impression Statement Her ROM is improving with flexion but still limited with abduciton and ER needed for her to groom her hair. Session focused on ROM focus to her tolerance. We will continue to progress this as able.    Personal Factors and Comorbidities Comorbidity 3+    Comorbidities anxiety, breast cancer with lumpectomy and radioactive seed placement 2021 and recent port removal, chronic pain, concussion 2008, HTN, depression, spinal fusion 2012    Examination-Activity Limitations Locomotion Level;Reach Overhead;Dressing;Hygiene/Grooming;Lift;Toileting;Bathing    Examination-Participation Restrictions Cleaning;Meal Prep;Community Activity;Driving;Laundry    Stability/Clinical Decision Making Evolving/Moderate complexity    Rehab Potential Good    PT Frequency 2x / week    PT Duration 12 weeks    PT Treatment/Interventions ADLs/Self Care Home Management;Therapeutic exercise;Patient/family education;Cryotherapy;DME Instruction;Ultrasound;Moist Heat;Functional mobility training;Therapeutic activities;Balance training;Neuromuscular re-education;Manual techniques;Vasopneumatic Device;Taping;Dry needling;Passive range of motion    PT Next Visit Plan progress AAROM and PROM to her tolerance working toward beginner AROM    PT Home Exercise Plan Access Code: [email or web address]    Consulted  and Agree with Plan of Care Patient           Patient will benefit from skilled therapeutic intervention in order to improve the following deficits and impairments:  Postural dysfunction,Decreased range of motion,Decreased knowledge of precautions,Increased edema,Impaired UE functional use,Decreased strength,Decreased mobility,Decreased balance,Pain  Visit Diagnosis: Acute pain of left shoulder  Abnormal posture  Stiffness of left shoulder, not elsewhere classified  Repeated falls  Muscle weakness (generalized)     Problem  List Patient Active Problem List   Diagnosis Date Noted  . Arthritis of left shoulder region   . S/P reverse total shoulder arthroplasty, 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  . Vitamin D deficiency 01/15/2021  . Vitamin B12 deficiency 01/15/2021  . Primary insomnia 01/15/2021  . Morbid obesity ([place]) 01/15/2021  . Moderate recurrent major depression ([place]) 01/15/2021  . Benzodiazepine dependence ([place]) 01/15/2021  . Port-A-Cath in place 07/22/2020  . Left leg cellulitis 04/17/2020  . Cellulitis of left leg 04/17/2020  . Hypokalemia 04/17/2020  . Macrocytic anemia 04/17/2020  . Anxiety 04/17/2020  . Depression 04/17/2020  . Chronic diarrhea 04/17/2020  . Chemotherapy induced diarrhea 04/17/2020  . Malignant neoplasm of upper-inner quadrant of right breast in female, estrogen receptor positive ([place]) 12/26/2019  . Encephalopathy 02/02/2018  . Hypothyroidism 02/02/2018  . AKI (acute kidney injury) ([place]) 02/02/2018  . Chronic back pain 02/02/2018  . Alcohol abuse 02/02/2018  . Weight gain 02/02/2018  . Benign essential HTN 02/02/2018    Silvestre Mesi 05/27/2021, 4:11 PM  [facility] [address] [place], [place], [address] Phone: [phone]   Fax:  [phone]  Name: [name] MRN: [id number] Date of Birth: 09/07/1951

## 2021-05-29 ENCOUNTER — Encounter: Payer: Self-pay | Admitting: Physical Therapy

## 2021-05-29 ENCOUNTER — Ambulatory Visit (INDEPENDENT_AMBULATORY_CARE_PROVIDER_SITE_OTHER): Payer: Medicare PPO | Admitting: Physical Therapy

## 2021-05-29 ENCOUNTER — Other Ambulatory Visit: Payer: Self-pay

## 2021-05-29 DIAGNOSIS — R296 Repeated falls: Secondary | ICD-10-CM | POA: Diagnosis not present

## 2021-05-29 DIAGNOSIS — M25512 Pain in left shoulder: Secondary | ICD-10-CM | POA: Diagnosis not present

## 2021-05-29 DIAGNOSIS — R293 Abnormal posture: Secondary | ICD-10-CM | POA: Diagnosis not present

## 2021-05-29 DIAGNOSIS — M25612 Stiffness of left shoulder, not elsewhere classified: Secondary | ICD-10-CM | POA: Diagnosis not present

## 2021-05-29 DIAGNOSIS — M6281 Muscle weakness (generalized): Secondary | ICD-10-CM

## 2021-05-29 DIAGNOSIS — R600 Localized edema: Secondary | ICD-10-CM

## 2021-05-29 NOTE — Therapy (Signed)
[facility] [address] [place], [place], [address] Phone: [phone]   Fax:  [phone]  Physical Therapy Treatment  Patient Details  Name: [name] MRN: [id number] Date of Birth: 1951/10/03 Referring Provider (PT): Donella Stade, [place]   Encounter Date: 05/29/2021   PT End of Session - 05/29/21 1114     Visit Number 9    Number of Visits 24    Date for PT Re-Evaluation 06/18/21    Authorization Type [employer]    Progress Note Due on Visit 10    PT Start Time 1102    PT Stop Time 1148    PT Time Calculation (min) 46 min    Activity Tolerance Patient tolerated treatment well    Behavior During Therapy [facility] for tasks assessed/performed             Past Medical History:  Diagnosis Date   Anxiety    Atrial fibrillation [facility])    sees Dr. Audie Box   Cancer [facility])    breast   Chronic pain    Concussion 3/08   ICU x 3 days   Depression    Hypertension    Hypothyroidism    Spinal stenosis    Thyroid disease ?1994    Past Surgical History:  Procedure Laterality Date   BREAST LUMPECTOMY WITH RADIOACTIVE SEED AND SENTINEL LYMPH NODE BIOPSY Right 01/22/2020   Procedure: RIGHT BREAST LUMPECTOMY WITH RADIOACTIVE SEED X2 AND RIGHT SENTINEL LYMPH NODE MAPPING;  Surgeon: Erroll Luna, MD;  Location: [place];  Service: General;  Laterality: Right;   BREAST SURGERY Right 4/99   breast biopsy, benign   EYE SURGERY Right    PORT-A-CATH REMOVAL Right 04/30/2021   Procedure: REMOVAL PORT-A-CATH;  Surgeon: Erroll Luna, MD;  Location: [place];  Service: General;  Laterality: Right;   PORTACATH PLACEMENT Right 01/22/2020   Procedure: INSERTION PORT-A-CATH WITH ULTRASOUND;  Surgeon: Erroll Luna, MD;  Location: [place];  Service: General;  Laterality: Right;   PORTACATH PLACEMENT Right 02/28/2020   Procedure: PORT A CATH REVISION;  Surgeon: Erroll Luna, MD;  Location: [place];   Service: General;  Laterality: Right;   REVERSE SHOULDER ARTHROPLASTY Left 02/02/2021   Procedure: LEFT REVERSE SHOULDER ARTHROPLASTY;  Surgeon: Meredith Pel, MD;  Location: [place];  Service: Orthopedics;  Laterality: Left;   SHOULDER SURGERY Right    SPINAL FUSION  03/04/11   with ORIF    There were no vitals filed for this visit.   Subjective Assessment - 05/29/21 1106     Subjective back is a little better today from the other day, still hurting quite a bit.  shoulder is doing well.    Pertinent History anxiety, breast cancer with lumpectomy and radioactive seed placement 2021 and recent port removal, chronic pain, concussion 2008, HTN, depression, spinal fusion 2012.    Patient Stated Goals improve function of Lt arm, raise shoulder over her head to do her hair    Currently in Pain? Yes    Pain Score 3     Pain Location Shoulder    Pain Orientation Left    Pain Descriptors / Indicators Sore;Aching    Pain Type Surgical pain    Pain Onset More than a month ago    Pain Frequency Constant    Aggravating Factors  moving it around    Pain Relieving Factors ibuprofen, tramadol  [facility] Adult PT Treatment/Exercise - 05/29/21 1107       Shoulder Exercises: Supine   Other Supine Exercises chest press with 2# bar x 10 reps      Shoulder Exercises: Seated   Flexion AAROM;10 reps   hands clasped together     Shoulder Exercises: Standing   Retraction 10 reps   5 sec hold   Other Standing Exercises UE ranger in flexion X 10 reps and circles X10 reps      Shoulder Exercises: Pulleys   Flexion 3 minutes    ABduction 3 minutes      Vasopneumatic   Number Minutes Vasopneumatic  10 minutes    Vasopnuematic Location  Shoulder    Vasopneumatic Pressure Low    Vasopneumatic Temperature  34      Manual Therapy   Manual therapy comments PROM left shoulder to tolerance all planes                      PT Short Term Goals  - 05/12/21 1502       PT SHORT TERM GOAL #1   Title independent with intial HEP    Status On-going      PT SHORT TERM GOAL #2   Title improve Lt shoulder passive ER to 30 deg    Baseline passive ER left shoulder 40 degrees    Status Achieved               PT Long Term Goals - 05/12/21 1502       PT LONG TERM GOAL #1   Title independet with final HEP    Status On-going      PT LONG TERM GOAL #2   Title Lt shoulder active flexion and abduction improved to at least 90 deg in order to perform ADLs    Status On-going      PT LONG TERM GOAL #3   Title report pain < 3/10 with activity for improved function    Status On-going      PT LONG TERM GOAL #4   Title FOTO score improved to 61 for improved function    Status On-going      PT LONG TERM GOAL #5   Title demonstrate at least 3/5 Lt shoulder flex/abduction strength for improved function    Status On-going                   Plan - 05/29/21 1148     Clinical Impression Statement Pt tolerated session well today with continued focus on ROM and to maximize function.  Will follow up with MD on the 20th and hopeful to progress to more active/strengthening exercises.    Personal Factors and Comorbidities Comorbidity 3+    Comorbidities anxiety, breast cancer with lumpectomy and radioactive seed placement 2021 and recent port removal, chronic pain, concussion 2008, HTN, depression, spinal fusion 2012    Examination-Activity Limitations Locomotion Level;Reach Overhead;Dressing;Hygiene/Grooming;Lift;Toileting;Bathing    Examination-Participation Restrictions Cleaning;Meal Prep;Community Activity;Driving;Laundry    Stability/Clinical Decision Making Evolving/Moderate complexity    Rehab Potential Good    PT Frequency 2x / week    PT Duration 12 weeks    PT Treatment/Interventions ADLs/Self Care Home Management;Therapeutic exercise;Patient/family education;Cryotherapy;DME Instruction;Ultrasound;Moist Heat;Functional  mobility training;Therapeutic activities;Balance training;Neuromuscular re-education;Manual techniques;Vasopneumatic Device;Taping;Dry needling;Passive range of motion    PT Next Visit Plan progress AAROM and PROM to her tolerance working toward beginner AROM, continue per POC, needs 10th visit progress note    PT Home Exercise Plan  Access Code: [email or web address]    Consulted and Agree with Plan of Care Patient             Patient will benefit from skilled therapeutic intervention in order to improve the following deficits and impairments:  Postural dysfunction, Decreased range of motion, Decreased knowledge of precautions, Increased edema, Impaired UE functional use, Decreased strength, Decreased mobility, Decreased balance, Pain  Visit Diagnosis: Acute pain of left shoulder  Abnormal posture  Stiffness of left shoulder, not elsewhere classified  Muscle weakness (generalized)  Repeated falls  Localized edema     Problem List Patient Active Problem List   Diagnosis Date Noted   Arthritis of left shoulder region    S/P reverse total shoulder arthroplasty, 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   Vitamin D deficiency 01/15/2021   Vitamin B12 deficiency 01/15/2021   Primary insomnia 01/15/2021   Morbid obesity ([place]) 01/15/2021   Moderate recurrent major depression ([place]) 01/15/2021   Benzodiazepine dependence ([place]) 01/15/2021   Port-A-Cath in place 07/22/2020   Left leg cellulitis 04/17/2020   Cellulitis of left leg 04/17/2020   Hypokalemia 04/17/2020   Macrocytic anemia 04/17/2020   Anxiety 04/17/2020   Depression 04/17/2020   Chronic diarrhea 04/17/2020   Chemotherapy induced diarrhea 04/17/2020   Malignant neoplasm of upper-inner quadrant of right breast in female, estrogen receptor positive ([place]) 12/26/2019   Encephalopathy 02/02/2018   Hypothyroidism 02/02/2018   AKI (acute kidney injury) ([place]) 02/02/2018   Chronic back pain 02/02/2018   Alcohol abuse 02/02/2018   Weight gain 02/02/2018    Benign essential HTN 02/02/2018     Laureen Abrahams, PT, DPT 05/29/21 11:51 AM    [facility] [address] [place], [place], [address] Phone: [phone]   Fax:  [phone]  Name: [name] MRN: [id number] Date of Birth: 01-19-1951

## 2021-06-02 ENCOUNTER — Ambulatory Visit (INDEPENDENT_AMBULATORY_CARE_PROVIDER_SITE_OTHER): Payer: Medicare PPO | Admitting: Physical Therapy

## 2021-06-02 ENCOUNTER — Other Ambulatory Visit: Payer: Self-pay

## 2021-06-02 DIAGNOSIS — M25512 Pain in left shoulder: Secondary | ICD-10-CM | POA: Diagnosis not present

## 2021-06-02 DIAGNOSIS — R293 Abnormal posture: Secondary | ICD-10-CM

## 2021-06-02 DIAGNOSIS — M25612 Stiffness of left shoulder, not elsewhere classified: Secondary | ICD-10-CM

## 2021-06-02 DIAGNOSIS — M6281 Muscle weakness (generalized): Secondary | ICD-10-CM

## 2021-06-02 NOTE — Therapy (Signed)
[facility] Therapy [address] [place], [place], [address] Phone: [phone]   Fax:  [phone]  Physical Therapy Treatment/Progress note Progress Note reporting period date 03/26/21 to 06/02/21  See below for objective and subjective measurements relating to patients progress with PT.   Patient Details  Name: [name] MRN: [id number] Date of Birth: 1950/12/31 Referring Provider (PT): Donella Stade, [place]   Encounter Date: 06/02/2021   PT End of Session - 06/02/21 1442     Visit Number 10    Number of Visits 24    Date for PT Re-Evaluation 06/18/21    Authorization Type [employer]    Progress Note Due on Visit 10    PT Start Time 1345    PT Stop Time 1441    PT Time Calculation (min) 56 min    Activity Tolerance Patient tolerated treatment well    Behavior During Therapy [facility] for tasks assessed/performed             Past Medical History:  Diagnosis Date   Anxiety    Atrial fibrillation [facility])    sees Dr. Audie Box   Cancer [facility])    breast   Chronic pain    Concussion 3/08   ICU x 3 days   Depression    Hypertension    Hypothyroidism    Spinal stenosis    Thyroid disease ?1994    Past Surgical History:  Procedure Laterality Date   BREAST LUMPECTOMY WITH RADIOACTIVE SEED AND SENTINEL LYMPH NODE BIOPSY Right 01/22/2020   Procedure: RIGHT BREAST LUMPECTOMY WITH RADIOACTIVE SEED X2 AND RIGHT SENTINEL LYMPH NODE MAPPING;  Surgeon: Erroll Luna, MD;  Location: [place];  Service: General;  Laterality: Right;   BREAST SURGERY Right 4/99   breast biopsy, benign   EYE SURGERY Right    PORT-A-CATH REMOVAL Right 04/30/2021   Procedure: REMOVAL PORT-A-CATH;  Surgeon: Erroll Luna, MD;  Location: [place];  Service: General;  Laterality: Right;   PORTACATH PLACEMENT Right 01/22/2020   Procedure: INSERTION PORT-A-CATH WITH ULTRASOUND;  Surgeon: Erroll Luna, MD;  Location: [place];   Service: General;  Laterality: Right;   PORTACATH PLACEMENT Right 02/28/2020   Procedure: PORT A CATH REVISION;  Surgeon: Erroll Luna, MD;  Location: [place];  Service: General;  Laterality: Right;   REVERSE SHOULDER ARTHROPLASTY Left 02/02/2021   Procedure: LEFT REVERSE SHOULDER ARTHROPLASTY;  Surgeon: Meredith Pel, MD;  Location: [place];  Service: Orthopedics;  Laterality: Left;   SHOULDER SURGERY Right    SPINAL FUSION  03/04/11   with ORIF    There were no vitals filed for this visit.   Subjective Assessment - 06/02/21 1350     Subjective 3/10 overall Lt shoulder pain today, still with complaints of not being able to raise her arm above her head like she would like to.    Pertinent History anxiety, breast cancer with lumpectomy and radioactive seed placement 2021 and recent port removal, chronic pain, concussion 2008, HTN, depression, spinal fusion 2012.    Patient Stated Goals improve function of Lt arm, raise shoulder over her head to do her hair    Pain Onset More than a month ago                [facility] PT Assessment - 06/02/21 0001       Assessment   Medical Diagnosis Z96.612 (ICD-10-CM) - History of arthroplasty of left shoulder    Referring Provider (PT) Magnant, Juanda Crumble  L, PA-C    Onset Date/Surgical Date 02/02/21      ROM / Strength   AROM / PROM / Strength Strength      AROM   Left Shoulder Flexion 100 Degrees    Left Shoulder ABduction 85 Degrees    Left Shoulder Internal Rotation --   [facility]   Left Shoulder External Rotation 20 Degrees   can not get behind her neck with functional reach     PROM   Left Shoulder Flexion 130 Degrees    Left Shoulder ABduction 120 Degrees    Left Shoulder External Rotation 70 Degrees      Strength   Overall Strength Comments in her available ROM    Strength Assessment Site Shoulder    Right/Left Shoulder Left    Left Shoulder Flexion 3+/5    Left Shoulder ABduction 3/5    Left Shoulder Internal Rotation 4+/5    Left  Shoulder External Rotation 4-/5                           [facility] Adult PT Treatment/Exercise - 06/02/21 0001       Shoulder Exercises: Supine   External Rotation AAROM;Left;15 reps    External Rotation Limitations 3# bar    Flexion AAROM;15 reps    Flexion Limitations 3# bar    Other Supine Exercises chest press with 3# bar x 15 reps      Shoulder Exercises: Seated   External Rotation Both    External Rotation Limitations 2 sets of 10 red    Flexion AROM;Both    Flexion Limitations 2 sets of 10    Abduction Both;AROM    ABduction Limitations 2 sets of 10 with elbows bent for less difficutly      Shoulder Exercises: Pulleys   Flexion 3 minutes    ABduction 3 minutes      Vasopneumatic   Number Minutes Vasopneumatic  10 minutes    Vasopnuematic Location  Shoulder    Vasopneumatic Pressure Low    Vasopneumatic Temperature  34      Manual Therapy   Manual therapy comments PROM left shoulder to tolerance all planes                      PT Short Term Goals - 06/02/21 1448       PT SHORT TERM GOAL #1   Title independent with intial HEP    Baseline progressed today    Status Achieved      PT SHORT TERM GOAL #2   Title improve Lt shoulder passive ER to 30 deg    Baseline passive ER left shoulder 40 degrees    Status Achieved               PT Long Term Goals - 06/02/21 1449       PT LONG TERM GOAL #1   Title independet with final HEP    Status On-going      PT LONG TERM GOAL #2   Title Lt shoulder active flexion and abduction improved to at least 90 deg in order to perform ADLs    Baseline met for flexion not for abd yet    Status On-going      PT LONG TERM GOAL #3   Title report pain < 3/10 with activity for improved function    Status On-going      PT LONG TERM GOAL #4   Title  FOTO score improved to 61 for improved function    Status On-going      PT LONG TERM GOAL #5   Title demonstrate at least 3/5 Lt shoulder  flex/abduction strength for improved function    Status On-going                   Plan - 06/02/21 1445     Clinical Impression Statement Progress note reflects she has made overall progress with her Lt shoulder in terms of ROM, strength, and pain levels. She does still lack ROM and strength and is only about 50% functional use of her Left shoulder in my opinion thus PT recommending to continue PT to continue to work toward these funcitonal impairments. She shows good effort with PT and reports compliance with HEP. HEP was also progressed today to reflect her progress and she shows good understanding of this    Personal Factors and Comorbidities Comorbidity 3+    Comorbidities anxiety, breast cancer with lumpectomy and radioactive seed placement 2021 and recent port removal, chronic pain, concussion 2008, HTN, depression, spinal fusion 2012    Examination-Activity Limitations Locomotion Level;Reach Overhead;Dressing;Hygiene/Grooming;Lift;Toileting;Bathing    Examination-Participation Restrictions Cleaning;Meal Prep;Community Activity;Driving;Laundry    Stability/Clinical Decision Making Evolving/Moderate complexity    Rehab Potential Good    PT Frequency 2x / week    PT Duration 12 weeks    PT Treatment/Interventions ADLs/Self Care Home Management;Therapeutic exercise;Patient/family education;Cryotherapy;DME Instruction;Ultrasound;Moist Heat;Functional mobility training;Therapeutic activities;Balance training;Neuromuscular re-education;Manual techniques;Vasopneumatic Device;Taping;Dry needling;Passive range of motion    PT Next Visit Plan progress ROM and strength as able.    PT Home Exercise Plan Access Code: [email or web address], added AROM flexion and abd against gravity in sitting and ER with red band    Consulted and Agree with Plan of Care Patient             Patient will benefit from skilled therapeutic intervention in order to improve the following deficits and impairments:  Postural  dysfunction, Decreased range of motion, Decreased knowledge of precautions, Increased edema, Impaired UE functional use, Decreased strength, Decreased mobility, Decreased balance, Pain  Visit Diagnosis: Acute pain of left shoulder  Abnormal posture  Stiffness of left shoulder, not elsewhere classified  Muscle weakness (generalized)     Problem List Patient Active Problem List   Diagnosis Date Noted   Arthritis of left shoulder region    S/P reverse total shoulder arthroplasty, 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   Vitamin D deficiency 01/15/2021   Vitamin B12 deficiency 01/15/2021   Primary insomnia 01/15/2021   Morbid obesity ([place]) 01/15/2021   Moderate recurrent major depression ([place]) 01/15/2021   Benzodiazepine dependence ([place]) 01/15/2021   Port-A-Cath in place 07/22/2020   Left leg cellulitis 04/17/2020   Cellulitis of left leg 04/17/2020   Hypokalemia 04/17/2020   Macrocytic anemia 04/17/2020   Anxiety 04/17/2020   Depression 04/17/2020   Chronic diarrhea 04/17/2020   Chemotherapy induced diarrhea 04/17/2020   Malignant neoplasm of upper-inner quadrant of right breast in female, estrogen receptor positive ([place]) 12/26/2019   Encephalopathy 02/02/2018   Hypothyroidism 02/02/2018   AKI (acute kidney injury) ([place]) 02/02/2018   Chronic back pain 02/02/2018   Alcohol abuse 02/02/2018   Weight gain 02/02/2018   Benign essential HTN 02/02/2018    Silvestre Mesi 06/02/2021, 2:49 PM  [facility] Physical Therapy [address] [place], [place], [address] Phone: [phone]   Fax:  [phone]  Name: [name] MRN: [id number] Date of Birth: 1951-07-23

## 2021-06-04 ENCOUNTER — Ambulatory Visit (INDEPENDENT_AMBULATORY_CARE_PROVIDER_SITE_OTHER): Payer: Medicare PPO | Admitting: Physical Therapy

## 2021-06-04 ENCOUNTER — Other Ambulatory Visit: Payer: Self-pay

## 2021-06-04 DIAGNOSIS — R293 Abnormal posture: Secondary | ICD-10-CM | POA: Diagnosis not present

## 2021-06-04 DIAGNOSIS — M25512 Pain in left shoulder: Secondary | ICD-10-CM | POA: Diagnosis not present

## 2021-06-04 DIAGNOSIS — M25612 Stiffness of left shoulder, not elsewhere classified: Secondary | ICD-10-CM

## 2021-06-04 DIAGNOSIS — R600 Localized edema: Secondary | ICD-10-CM

## 2021-06-04 DIAGNOSIS — R296 Repeated falls: Secondary | ICD-10-CM

## 2021-06-04 NOTE — Therapy (Signed)
[facility] Physical Therapy [address] [place], [place], [address] Phone: [phone]   Fax:  [phone]  Physical Therapy Treatment/Recert Referring diagnosis? T51.761 Treatment diagnosis? (if different than referring diagnosis) m25.512 What was this (referring dx) caused by? _0  Surgery _1  Fall _2  Ongoing issue _3  Arthritis _4  Other: ____________  Laterality: _5  Rt _6  Lt _7  Both  Check all possible CPT codes:      _8  97110 (Therapeutic Exercise)  _9  92507 (SLP Treatment)  _10  60737 (Neuro Re-ed)   _11  92526 (Swallowing Treatment)   _12  10626 (Gait Training)   _13  94854 (Cognitive Training, 1st 15 minutes) _14  97140 (Manual Therapy)   _15  97130 (Cognitive Training, each add'l 15 minutes)  _16  97530 (Therapeutic Activities)  _17  Other, List CPT Code ____________    _18  62703 (Self Care)       _19  All codes above (97110 - 97535)  _20  97012 (Mechanical Traction)  _21  97014 (E-stim Unattended)  _22  97032 (E-stim manual)  _23  97033 (Ionto)  _24  97035 (Ultrasound)  _25  97760 (Orthotic Fit) _26  97750 (Physical Performance Training) _27  [id number] (Aquatic Therapy) _28  50093 (Contrast Bath) _29  81829 (Paraffin) _30  97597 (Wound Care 1st 20 sq cm) _31  97598 (Wound Care each add'l 20 sq cm) _32  97016 (Vasopneumatic Device) _33  93716 (Orthotic Training) _34  [phone] (Prosthetic Training)  Patient Details  Name: [name] MRN: [id number] Date of Birth: [birth year] Referring Provider (PT): Donella Stade, [place]   Encounter Date: 06/04/2021   PT End of Session - 06/04/21 1400     Visit Number 11    Number of Visits 24    Date for PT Re-Evaluation 07/16/21    Authorization Type [employer]    Progress Note Due on Visit 20    PT Start Time 1350    PT Stop Time 1440    PT Time Calculation (min) 50 min    Activity Tolerance Patient tolerated treatment well    Behavior During Therapy [facility] for tasks assessed/performed             Past Medical History:   Diagnosis Date   Anxiety    Atrial fibrillation [facility])    sees Dr. Audie Box   Cancer [facility])    breast   Chronic pain    Concussion 3/08   ICU x 3 days   Depression    Hypertension    Hypothyroidism    Spinal stenosis    Thyroid disease ?1994    Past Surgical History:  Procedure Laterality Date   BREAST LUMPECTOMY WITH RADIOACTIVE SEED AND SENTINEL LYMPH NODE BIOPSY Right 01/22/2020   Procedure: RIGHT BREAST LUMPECTOMY WITH RADIOACTIVE SEED X2 AND RIGHT SENTINEL LYMPH NODE MAPPING;  Surgeon: Erroll Luna, MD;  Location: [place];  Service: General;  Laterality: Right;   BREAST SURGERY Right 4/99   breast biopsy, benign   EYE SURGERY Right    PORT-A-CATH REMOVAL Right 04/30/2021   Procedure: REMOVAL PORT-A-CATH;  Surgeon: Erroll Luna, MD;  Location: [place];  Service: General;  Laterality: Right;   PORTACATH PLACEMENT Right 01/22/2020   Procedure: INSERTION PORT-A-CATH WITH ULTRASOUND;  Surgeon: Erroll Luna, MD;  Location: [place];  Service: General;  Laterality: Right;   PORTACATH PLACEMENT Right 02/28/2020   Procedure: PORT A CATH REVISION;  Surgeon: Erroll Luna, MD;  Location: [place];  Service: General;  Laterality: Right;   REVERSE SHOULDER ARTHROPLASTY Left 02/02/2021   Procedure: LEFT REVERSE SHOULDER ARTHROPLASTY;  Surgeon: Meredith Pel, MD;  Location:  [place];  Service: Orthopedics;  Laterality: Left;   SHOULDER SURGERY Right    SPINAL FUSION  03/04/11   with ORIF    There were no vitals filed for this visit.   Subjective Assessment - 06/04/21 1359     Subjective 3/10 overall Lt shoulder pain today but not able to use her arm the way she wants    Pertinent History anxiety, breast cancer with lumpectomy and radioactive seed placement 2021 and recent port removal, chronic pain, concussion 2008, HTN, depression, spinal fusion 2012.    Patient Stated Goals improve function of Lt arm, raise shoulder over her head  to do her hair    Pain Onset More than a month ago                [facility] PT Assessment - 06/04/21 0001       Assessment   Medical Diagnosis Z96.612 (ICD-10-CM) - History of arthroplasty of left shoulder    Referring Provider (PT) Magnant, Gerrianne Scale, PA-C    Onset Date/Surgical Date 02/02/21      AROM   Left Shoulder Flexion 100 Degrees    Left Shoulder ABduction 85 Degrees    Left Shoulder Internal Rotation --   [facility]   Left Shoulder External Rotation 20 Degrees   can not get behind her neck with functional reach     PROM   Left Shoulder Flexion 130 Degrees    Left Shoulder ABduction 120 Degrees    Left Shoulder External Rotation 70 Degrees      Strength   Overall Strength Comments in her available ROM    Left Shoulder Flexion 3+/5    Left Shoulder ABduction 3/5    Left Shoulder Internal Rotation 4+/5    Left Shoulder External Rotation 4-/5                           [facility] Adult PT Treatment/Exercise - 06/04/21 0001       Shoulder Exercises: Seated   External Rotation Left;Strengthening;10 reps   5 sec isometrics pushing against PT   Flexion AAROM;Both;15 reps    Flexion Limitations 2# bar    Other Seated Exercises tablestretch ER and abd 5 sec X10 ea      Shoulder Exercises: Standing   Other Standing Exercises standing doorway stretch for Lt ER 10 sec X10    Other Standing Exercises UE ranger in flexion X 10 reps and circles X10 reps, then scaption 10 reps      Shoulder Exercises: Pulleys   Flexion 3 minutes    ABduction 3 minutes      Vasopneumatic   Number Minutes Vasopneumatic  10 minutes    Vasopnuematic Location  Shoulder    Vasopneumatic Pressure Low    Vasopneumatic Temperature  34      Manual Therapy   Manual therapy comments PROM left shoulder to tolerance all planes                      PT Short Term Goals - 06/02/21 1448       PT SHORT TERM GOAL #1   Title independent with intial HEP    Baseline progressed  today    Status Achieved      PT SHORT TERM GOAL #2   Title improve Lt shoulder passive ER to 30 deg    Baseline passive ER left shoulder 40 degrees    Status Achieved  PT Long Term Goals - 06/02/21 1449       PT LONG TERM GOAL #1   Title independet with final HEP    Status On-going      PT LONG TERM GOAL #2   Title Lt shoulder active flexion and abduction improved to at least 90 deg in order to perform ADLs    Baseline met for flexion not for abd yet    Status On-going      PT LONG TERM GOAL #3   Title report pain < 3/10 with activity for improved function    Status On-going      PT LONG TERM GOAL #4   Title FOTO score improved to 61 for improved function    Status On-going      PT LONG TERM GOAL #5   Title demonstrate at least 3/5 Lt shoulder flex/abduction strength for improved function    Status On-going                   Plan - 06/04/21 1402     Clinical Impression Statement We filed for authorization for more PT visits through [facility] as PT remains medically necessary to address her continued impairments in Lt shoulder strenght, ROM, and functional reaching abilitites limiting her quality of life. Progress has been slow but steadily improving thus far.    Personal Factors and Comorbidities Comorbidity 3+    Comorbidities anxiety, breast cancer with lumpectomy and radioactive seed placement 2021 and recent port removal, chronic pain, concussion 2008, HTN, depression, spinal fusion 2012    Examination-Activity Limitations Locomotion Level;Reach Overhead;Dressing;Hygiene/Grooming;Lift;Toileting;Bathing    Examination-Participation Restrictions Cleaning;Meal Prep;Community Activity;Driving;Laundry    Stability/Clinical Decision Making Evolving/Moderate complexity    Rehab Potential Good    PT Frequency 2x / week    PT Duration 12 weeks    PT Treatment/Interventions ADLs/Self Care Home Management;Therapeutic exercise;Patient/family  education;Cryotherapy;DME Instruction;Ultrasound;Moist Heat;Functional mobility training;Therapeutic activities;Balance training;Neuromuscular re-education;Manual techniques;Vasopneumatic Device;Taping;Dry needling;Passive range of motion    PT Next Visit Plan progress ROM and strength as able.    PT Home Exercise Plan Access Code: [email or web address], added AROM flexion and abd against gravity in sitting and ER with red band    Consulted and Agree with Plan of Care Patient             Patient will benefit from skilled therapeutic intervention in order to improve the following deficits and impairments:  Postural dysfunction, Decreased range of motion, Decreased knowledge of precautions, Increased edema, Impaired UE functional use, Decreased strength, Decreased mobility, Decreased balance, Pain  Visit Diagnosis: Acute pain of left shoulder  Abnormal posture  Stiffness of left shoulder, not elsewhere classified  Repeated falls  Localized edema     Problem List Patient Active Problem List   Diagnosis Date Noted   Arthritis of left shoulder region    S/P reverse total shoulder arthroplasty, 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   Vitamin D deficiency 01/15/2021   Vitamin B12 deficiency 01/15/2021   Primary insomnia 01/15/2021   Morbid obesity ([place]) 01/15/2021   Moderate recurrent major depression ([place]) 01/15/2021   Benzodiazepine dependence ([place]) 01/15/2021   Port-A-Cath in place 07/22/2020   Left leg cellulitis 04/17/2020   Cellulitis of left leg 04/17/2020   Hypokalemia 04/17/2020   Macrocytic anemia 04/17/2020   Anxiety 04/17/2020   Depression 04/17/2020   Chronic diarrhea 04/17/2020   Chemotherapy induced diarrhea 04/17/2020   Malignant neoplasm of upper-inner quadrant of right breast in female, estrogen receptor positive ([place]) 12/26/2019   Encephalopathy 02/02/2018  Hypothyroidism 02/02/2018   AKI (acute kidney injury) ([place]) 02/02/2018   Chronic back pain 02/02/2018   Alcohol abuse 02/02/2018    Weight gain 02/02/2018   Benign essential HTN 02/02/2018    Silvestre Mesi 06/04/2021, 2:31 PM  [facility] Physical Therapy [address] [place], [place], [address] Phone: [phone]   Fax:  [phone]  Name: [name] MRN: [id number] Date of Birth: Jul 03, 1951

## 2021-06-08 ENCOUNTER — Encounter: Payer: Medicare PPO | Admitting: Orthopedic Surgery

## 2021-06-09 ENCOUNTER — Encounter: Payer: Self-pay | Admitting: Physical Therapy

## 2021-06-09 ENCOUNTER — Other Ambulatory Visit: Payer: Self-pay

## 2021-06-09 ENCOUNTER — Ambulatory Visit (INDEPENDENT_AMBULATORY_CARE_PROVIDER_SITE_OTHER): Payer: Medicare PPO | Admitting: Physical Therapy

## 2021-06-09 DIAGNOSIS — M6281 Muscle weakness (generalized): Secondary | ICD-10-CM

## 2021-06-09 DIAGNOSIS — R600 Localized edema: Secondary | ICD-10-CM | POA: Diagnosis not present

## 2021-06-09 DIAGNOSIS — R296 Repeated falls: Secondary | ICD-10-CM

## 2021-06-09 DIAGNOSIS — R293 Abnormal posture: Secondary | ICD-10-CM | POA: Diagnosis not present

## 2021-06-09 DIAGNOSIS — M25612 Stiffness of left shoulder, not elsewhere classified: Secondary | ICD-10-CM | POA: Diagnosis not present

## 2021-06-09 DIAGNOSIS — R262 Difficulty in walking, not elsewhere classified: Secondary | ICD-10-CM | POA: Diagnosis not present

## 2021-06-09 DIAGNOSIS — M25512 Pain in left shoulder: Secondary | ICD-10-CM | POA: Diagnosis not present

## 2021-06-09 NOTE — Therapy (Signed)
[facility] Physical Therapy [address] [place], [place], [address] Phone: [phone]   Fax:  [phone]  Physical Therapy Treatment  Patient Details  Name: [name] MRN: [id number] Date of Birth: 1951-05-27 Referring Provider (PT): Donella Stade, [place]   Encounter Date: 06/09/2021   PT End of Session - 06/09/21 1347     Visit Number 12    Number of Visits 24    Date for PT Re-Evaluation 07/16/21    Authorization Type [employer]    Progress Note Due on Visit 20    PT Start Time 1305    PT Stop Time 1350    PT Time Calculation (min) 45 min    Activity Tolerance Patient tolerated treatment well    Behavior During Therapy [facility] for tasks assessed/performed             Past Medical History:  Diagnosis Date   Anxiety    Atrial fibrillation [facility])    sees Dr. Audie Box   Cancer [facility])    breast   Chronic pain    Concussion 3/08   ICU x 3 days   Depression    Hypertension    Hypothyroidism    Spinal stenosis    Thyroid disease ?1994    Past Surgical History:  Procedure Laterality Date   BREAST LUMPECTOMY WITH RADIOACTIVE SEED AND SENTINEL LYMPH NODE BIOPSY Right 01/22/2020   Procedure: RIGHT BREAST LUMPECTOMY WITH RADIOACTIVE SEED X2 AND RIGHT SENTINEL LYMPH NODE MAPPING;  Surgeon: Erroll Luna, MD;  Location: [place];  Service: General;  Laterality: Right;   BREAST SURGERY Right 4/99   breast biopsy, benign   EYE SURGERY Right    PORT-A-CATH REMOVAL Right 04/30/2021   Procedure: REMOVAL PORT-A-CATH;  Surgeon: Erroll Luna, MD;  Location: [place];  Service: General;  Laterality: Right;   PORTACATH PLACEMENT Right 01/22/2020   Procedure: INSERTION PORT-A-CATH WITH ULTRASOUND;  Surgeon: Erroll Luna, MD;  Location: [place];  Service: General;  Laterality: Right;   PORTACATH PLACEMENT Right 02/28/2020   Procedure: PORT A CATH REVISION;  Surgeon: Erroll Luna, MD;  Location: [place];   Service: General;  Laterality: Right;   REVERSE SHOULDER ARTHROPLASTY Left 02/02/2021   Procedure: LEFT REVERSE SHOULDER ARTHROPLASTY;  Surgeon: Meredith Pel, MD;  Location: [place];  Service: Orthopedics;  Laterality: Left;   SHOULDER SURGERY Right    SPINAL FUSION  03/04/11   with ORIF    There were no vitals filed for this visit.   Subjective Assessment - 06/09/21 1346     Subjective Pt arriving to therapy reporting 1/10 pain at rest in left shoulder.    Pertinent History anxiety, breast cancer with lumpectomy and radioactive seed placement 2021 and recent port removal, chronic pain, concussion 2008, HTN, depression, spinal fusion 2012.    Patient Stated Goals improve function of Lt arm, raise shoulder over her head to do her hair    Currently in Pain? Yes    Pain Score 1     Pain Location Shoulder    Pain Descriptors / Indicators Aching;Sore    Pain Type Surgical pain    Pain Onset More than a month ago                               [facility] Adult PT Treatment/Exercise - 06/09/21 0001       Shoulder Exercises: Seated   External Rotation Left;Strengthening;10  reps   5 sec isometrics pushing against PT   Flexion AAROM;Both;15 reps    Flexion Limitations 2# bar    Other Seated Exercises tablestretch ER and abd 5 sec X10 ea      Shoulder Exercises: Standing   Other Standing Exercises standing doorway stretch for Lt ER 10 sec X10    Other Standing Exercises UE ranger in flexion X 10 reps and circles X10 reps, then scaption 10 reps      Shoulder Exercises: ROM/Strengthening   UBE (Upper Arm Bike) L2 4 minutes (2 forward/2 back)      Vasopneumatic   Number Minutes Vasopneumatic  10 minutes    Vasopnuematic Location  Shoulder    Vasopneumatic Pressure Low    Vasopneumatic Temperature  34      Manual Therapy   Manual therapy comments PROM left shoulder to tolerance all planes                      PT Short Term Goals - 06/02/21 1448        PT SHORT TERM GOAL #1   Title independent with intial HEP    Baseline progressed today    Status Achieved      PT SHORT TERM GOAL #2   Title improve Lt shoulder passive ER to 30 deg    Baseline passive ER left shoulder 40 degrees    Status Achieved               PT Long Term Goals - 06/09/21 1357       PT LONG TERM GOAL #1   Title independet with final HEP    Status On-going      PT LONG TERM GOAL #2   Title Lt shoulder active flexion and abduction improved to at least 90 deg in order to perform ADLs    Status On-going      PT LONG TERM GOAL #3   Title report pain < 3/10 with activity for improved function    Status On-going      PT LONG TERM GOAL #4   Title FOTO score improved to 61 for improved function    Status On-going      PT LONG TERM GOAL #5   Title demonstrate at least 3/5 Lt shoulder flex/abduction strength for improved function    Status On-going                   Plan - 06/09/21 1355     Clinical Impression Statement Pt tolerating treatment well with some limitations in ROM due to pain. Pt progressing with more active ROM and strengthening exercises. Pt reporting compliance with her HEP. Continue skilled PT to maximze function.    Comorbidities anxiety, breast cancer with lumpectomy and radioactive seed placement 2021 and recent port removal, chronic pain, concussion 2008, HTN, depression, spinal fusion 2012    Examination-Activity Limitations Locomotion Level;Reach Overhead;Dressing;Hygiene/Grooming;Lift;Toileting;Bathing    Examination-Participation Restrictions Cleaning;Meal Prep;Community Activity;Driving;Laundry    Stability/Clinical Decision Making Evolving/Moderate complexity    Rehab Potential Good    PT Frequency 2x / week    PT Duration 12 weeks    PT Treatment/Interventions ADLs/Self Care Home Management;Therapeutic exercise;Patient/family education;Cryotherapy;DME Instruction;Ultrasound;Moist Heat;Functional mobility  training;Therapeutic activities;Balance training;Neuromuscular re-education;Manual techniques;Vasopneumatic Device;Taping;Dry needling;Passive range of motion    PT Next Visit Plan update ROM at next visit, progress ROM and srengthening    PT Home Exercise Plan Access Code: [email or web address], added AROM flexion and abd against gravity in sitting  and ER with red band    Consulted and Agree with Plan of Care Patient             Patient will benefit from skilled therapeutic intervention in order to improve the following deficits and impairments:  Postural dysfunction, Decreased range of motion, Decreased knowledge of precautions, Increased edema, Impaired UE functional use, Decreased strength, Decreased mobility, Decreased balance, Pain  Visit Diagnosis: Acute pain of left shoulder  Abnormal posture  Stiffness of left shoulder, not elsewhere classified  Repeated falls  Localized edema  Muscle weakness (generalized)  Difficulty in walking, not elsewhere classified     Problem List Patient Active Problem List   Diagnosis Date Noted   Arthritis of left shoulder region    S/P reverse total shoulder arthroplasty, 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   Vitamin D deficiency 01/15/2021   Vitamin B12 deficiency 01/15/2021   Primary insomnia 01/15/2021   Morbid obesity ([place]) 01/15/2021   Moderate recurrent major depression ([place]) 01/15/2021   Benzodiazepine dependence ([place]) 01/15/2021   Port-A-Cath in place 07/22/2020   Left leg cellulitis 04/17/2020   Cellulitis of left leg 04/17/2020   Hypokalemia 04/17/2020   Macrocytic anemia 04/17/2020   Anxiety 04/17/2020   Depression 04/17/2020   Chronic diarrhea 04/17/2020   Chemotherapy induced diarrhea 04/17/2020   Malignant neoplasm of upper-inner quadrant of right breast in female, estrogen receptor positive ([place]) 12/26/2019   Encephalopathy 02/02/2018   Hypothyroidism 02/02/2018   AKI (acute kidney injury) ([place]) 02/02/2018   Chronic back pain 02/02/2018    Alcohol abuse 02/02/2018   Weight gain 02/02/2018   Benign essential HTN 02/02/2018    Oretha Caprice, PT, MPT 06/09/2021, 1:58 PM  [facility] Physical Therapy [address] [place], [place], [address] Phone: [phone]   Fax:  [phone]  Name: SARENA JEZEK MRN: [id number] Date of Birth: 09-05-1951

## 2021-06-11 ENCOUNTER — Ambulatory Visit (INDEPENDENT_AMBULATORY_CARE_PROVIDER_SITE_OTHER): Payer: Medicare PPO | Admitting: Physical Therapy

## 2021-06-11 ENCOUNTER — Other Ambulatory Visit: Payer: Self-pay

## 2021-06-11 DIAGNOSIS — R293 Abnormal posture: Secondary | ICD-10-CM

## 2021-06-11 DIAGNOSIS — M6281 Muscle weakness (generalized): Secondary | ICD-10-CM | POA: Diagnosis not present

## 2021-06-11 DIAGNOSIS — R600 Localized edema: Secondary | ICD-10-CM | POA: Diagnosis not present

## 2021-06-11 DIAGNOSIS — M25612 Stiffness of left shoulder, not elsewhere classified: Secondary | ICD-10-CM | POA: Diagnosis not present

## 2021-06-11 DIAGNOSIS — R296 Repeated falls: Secondary | ICD-10-CM

## 2021-06-11 DIAGNOSIS — M25512 Pain in left shoulder: Secondary | ICD-10-CM | POA: Diagnosis not present

## 2021-06-11 NOTE — Therapy (Signed)
[facility] Physical Therapy [address] [place], [place], [address] Phone: [phone]   Fax:  [phone]  Physical Therapy Treatment  Patient Details  Name: [name] MRN: [id number] Date of Birth: [birth year] Referring Provider (PT): Donella Stade, [place]   Encounter Date: 06/11/2021   PT End of Session - 06/11/21 1400     Visit Number 13    Number of Visits 24    Date for PT Re-Evaluation 07/16/21    Authorization Type [employer]    Progress Note Due on Visit 20    PT Start Time 1300    PT Stop Time 1345    PT Time Calculation (min) 45 min    Activity Tolerance Patient tolerated treatment well    Behavior During Therapy [facility] for tasks assessed/performed             Past Medical History:  Diagnosis Date   Anxiety    Atrial fibrillation [facility])    sees Dr. Audie Box   Cancer [facility])    breast   Chronic pain    Concussion 3/08   ICU x 3 days   Depression    Hypertension    Hypothyroidism    Spinal stenosis    Thyroid disease ?1994    Past Surgical History:  Procedure Laterality Date   BREAST LUMPECTOMY WITH RADIOACTIVE SEED AND SENTINEL LYMPH NODE BIOPSY Right 01/22/2020   Procedure: RIGHT BREAST LUMPECTOMY WITH RADIOACTIVE SEED X2 AND RIGHT SENTINEL LYMPH NODE MAPPING;  Surgeon: Erroll Luna, MD;  Location: [place];  Service: General;  Laterality: Right;   BREAST SURGERY Right 4/99   breast biopsy, benign   EYE SURGERY Right    PORT-A-CATH REMOVAL Right 04/30/2021   Procedure: REMOVAL PORT-A-CATH;  Surgeon: Erroll Luna, MD;  Location: [place];  Service: General;  Laterality: Right;   PORTACATH PLACEMENT Right 01/22/2020   Procedure: INSERTION PORT-A-CATH WITH ULTRASOUND;  Surgeon: Erroll Luna, MD;  Location: [place];  Service: General;  Laterality: Right;   PORTACATH PLACEMENT Right 02/28/2020   Procedure: PORT A CATH REVISION;  Surgeon: Erroll Luna, MD;  Location: [place];   Service: General;  Laterality: Right;   REVERSE SHOULDER ARTHROPLASTY Left 02/02/2021   Procedure: LEFT REVERSE SHOULDER ARTHROPLASTY;  Surgeon: Meredith Pel, MD;  Location: [place];  Service: Orthopedics;  Laterality: Left;   SHOULDER SURGERY Right    SPINAL FUSION  03/04/11   with ORIF    There were no vitals filed for this visit.   Subjective Assessment - 06/11/21 1356     Subjective Pt arriving stating her shoulder is doing better, 1/10 pain, but her back is hurting more today.    Pertinent History anxiety, breast cancer with lumpectomy and radioactive seed placement 2021 and recent port removal, chronic pain, concussion 2008, HTN, depression, spinal fusion 2012.    Patient Stated Goals improve function of Lt arm, raise shoulder over her head to do her hair    Pain Onset More than a month ago                [facility] PT Assessment - 06/11/21 0001       Assessment   Medical Diagnosis Z96.612 (ICD-10-CM) - History of arthroplasty of left shoulder    Referring Provider (PT) Magnant, Gerrianne Scale, PA-C    Onset Date/Surgical Date 02/02/21      Observation/Other Assessments   Focus on Therapeutic Outcomes (FOTO)  53% functional score today, improved from 43% at  intake, goal is 61%              [facility] Adult PT Treatment/Exercise - 06/11/21 0001       Shoulder Exercises: Seated   Flexion AROM;Both;10 reps    Abduction AROM;Both;10 reps    Other Seated Exercises tablestretch ER and abd 5 sec X15 ea    Other Seated Exercises seated isometrics pushing against PT for flexion,extension, abd, IR.ER X 10 ea 5 sec      Shoulder Exercises: Pulleys   Flexion 3 minutes    ABduction 3 minutes      Shoulder Exercises: ROM/Strengthening   UBE (Upper Arm Bike) L2 5 minutes (2.5 forward/2.5 back)      Vasopneumatic   Number Minutes Vasopneumatic  10 minutes    Vasopnuematic Location  Shoulder    Vasopneumatic Pressure Low    Vasopneumatic Temperature  34      Manual Therapy    Manual therapy comments PROM left shoulder to tolerance all planes                      PT Short Term Goals - 06/02/21 1448       PT SHORT TERM GOAL #1   Title independent with intial HEP    Baseline progressed today    Status Achieved      PT SHORT TERM GOAL #2   Title improve Lt shoulder passive ER to 30 deg    Baseline passive ER left shoulder 40 degrees    Status Achieved               PT Long Term Goals - 06/09/21 1357       PT LONG TERM GOAL #1   Title independet with final HEP    Status On-going      PT LONG TERM GOAL #2   Title Lt shoulder active flexion and abduction improved to at least 90 deg in order to perform ADLs    Status On-going      PT LONG TERM GOAL #3   Title report pain < 3/10 with activity for improved function    Status On-going      PT LONG TERM GOAL #4   Title FOTO score improved to 61 for improved function    Status On-going      PT LONG TERM GOAL #5   Title demonstrate at least 3/5 Lt shoulder flex/abduction strength for improved function    Status On-going                   Plan - 06/11/21 1402     Clinical Impression Statement ROM and strength slowly improving in her left shoulder and has overall been having less pain. FOTO functional score improved today showing ADL's are becoming less difficult. She will continue to benefit from PT to maximixe funciton.    Comorbidities anxiety, breast cancer with lumpectomy and radioactive seed placement 2021 and recent port removal, chronic pain, concussion 2008, HTN, depression, spinal fusion 2012    Examination-Activity Limitations Locomotion Level;Reach Overhead;Dressing;Hygiene/Grooming;Lift;Toileting;Bathing    Examination-Participation Restrictions Cleaning;Meal Prep;Community Activity;Driving;Laundry    Stability/Clinical Decision Making Evolving/Moderate complexity    Rehab Potential Good    PT Frequency 2x / week    PT Duration 12 weeks    PT  Treatment/Interventions ADLs/Self Care Home Management;Therapeutic exercise;Patient/family education;Cryotherapy;DME Instruction;Ultrasound;Moist Heat;Functional mobility training;Therapeutic activities;Balance training;Neuromuscular re-education;Manual techniques;Vasopneumatic Device;Taping;Dry needling;Passive range of motion    PT Next Visit Plan update ROM at next visit,  progress ROM and srengthening    PT Home Exercise Plan Access Code: [email or web address], added AROM flexion and abd against gravity in sitting and ER with red band    Consulted and Agree with Plan of Care Patient             Patient will benefit from skilled therapeutic intervention in order to improve the following deficits and impairments:  Postural dysfunction, Decreased range of motion, Decreased knowledge of precautions, Increased edema, Impaired UE functional use, Decreased strength, Decreased mobility, Decreased balance, Pain  Visit Diagnosis: Acute pain of left shoulder  Stiffness of left shoulder, not elsewhere classified  Abnormal posture  Repeated falls  Localized edema  Muscle weakness (generalized)     Problem List Patient Active Problem List   Diagnosis Date Noted   Arthritis of left shoulder region    S/P reverse total shoulder arthroplasty, 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   Vitamin D deficiency 01/15/2021   Vitamin B12 deficiency 01/15/2021   Primary insomnia 01/15/2021   Morbid obesity ([place]) 01/15/2021   Moderate recurrent major depression ([place]) 01/15/2021   Benzodiazepine dependence ([place]) 01/15/2021   Port-A-Cath in place 07/22/2020   Left leg cellulitis 04/17/2020   Cellulitis of left leg 04/17/2020   Hypokalemia 04/17/2020   Macrocytic anemia 04/17/2020   Anxiety 04/17/2020   Depression 04/17/2020   Chronic diarrhea 04/17/2020   Chemotherapy induced diarrhea 04/17/2020   Malignant neoplasm of upper-inner quadrant of right breast in female, estrogen receptor positive ([place]) 12/26/2019   Encephalopathy  02/02/2018   Hypothyroidism 02/02/2018   AKI (acute kidney injury) ([place]) 02/02/2018   Chronic back pain 02/02/2018   Alcohol abuse 02/02/2018   Weight gain 02/02/2018   Benign essential HTN 02/02/2018    Silvestre Mesi 06/11/2021, 2:04 PM  [facility] Physical Therapy [address] [place], [place], [address] Phone: [phone]   Fax:  [phone]  Name: JENAN ELLEGOOD MRN: [id number] Date of Birth: 07/14/1951

## 2021-06-15 ENCOUNTER — Ambulatory Visit
Admission: RE | Admit: 2021-06-15 | Discharge: 2021-06-15 | Disposition: A | Discharge status: Home / Self Care | Payer: Medicare PPO | Source: Ambulatory Visit | Attending: Hematology and Oncology | Admitting: Hematology and Oncology

## 2021-06-15 ENCOUNTER — Other Ambulatory Visit: Payer: Self-pay

## 2021-06-15 ENCOUNTER — Encounter: Payer: Self-pay | Admitting: Hematology and Oncology

## 2021-06-15 DIAGNOSIS — R922 Inconclusive mammogram: Secondary | ICD-10-CM | POA: Diagnosis not present

## 2021-06-15 DIAGNOSIS — Z170 Estrogen receptor positive status [ER+]: Secondary | ICD-10-CM

## 2021-06-15 DIAGNOSIS — C50211 Malignant neoplasm of upper-inner quadrant of right female breast: Secondary | ICD-10-CM

## 2021-06-15 HISTORY — DX: Personal history of antineoplastic chemotherapy: Z92.21

## 2021-06-15 HISTORY — DX: Personal history of irradiation: Z92.3

## 2021-06-16 ENCOUNTER — Ambulatory Visit (INDEPENDENT_AMBULATORY_CARE_PROVIDER_SITE_OTHER): Payer: Medicare PPO | Admitting: Physical Therapy

## 2021-06-16 ENCOUNTER — Encounter: Payer: Self-pay | Admitting: Physical Therapy

## 2021-06-16 DIAGNOSIS — R296 Repeated falls: Secondary | ICD-10-CM | POA: Diagnosis not present

## 2021-06-16 DIAGNOSIS — M25612 Stiffness of left shoulder, not elsewhere classified: Secondary | ICD-10-CM | POA: Diagnosis not present

## 2021-06-16 DIAGNOSIS — R600 Localized edema: Secondary | ICD-10-CM

## 2021-06-16 DIAGNOSIS — R262 Difficulty in walking, not elsewhere classified: Secondary | ICD-10-CM

## 2021-06-16 DIAGNOSIS — M25512 Pain in left shoulder: Secondary | ICD-10-CM | POA: Diagnosis not present

## 2021-06-16 DIAGNOSIS — M6281 Muscle weakness (generalized): Secondary | ICD-10-CM

## 2021-06-16 DIAGNOSIS — R293 Abnormal posture: Secondary | ICD-10-CM | POA: Diagnosis not present

## 2021-06-16 NOTE — Therapy (Signed)
[facility] Physical Therapy [address] [place], [place], [address] Phone: [phone]   Fax:  [phone]  Physical Therapy Treatment  Patient Details  Name: [name] MRN: [id number] Date of Birth: August 04, 1951 Referring Provider (PT): Donella Stade, [place]   Encounter Date: 06/16/2021   PT End of Session - 06/16/21 1316     Visit Number 14    Number of Visits 24    Date for PT Re-Evaluation 07/16/21    Authorization Type [employer]    Progress Note Due on Visit 20    PT Start Time 1300    PT Stop Time 1345    PT Time Calculation (min) 45 min    Activity Tolerance Patient tolerated treatment well    Behavior During Therapy [facility] for tasks assessed/performed             Past Medical History:  Diagnosis Date   Anxiety    Atrial fibrillation [facility])    sees Dr. Audie Box   Cancer [facility])    breast   Chronic pain    Concussion 02/2007   ICU x 3 days   Depression    Hypertension    Hypothyroidism    Personal history of chemotherapy    Personal history of radiation therapy    Spinal stenosis    Thyroid disease ?1994    Past Surgical History:  Procedure Laterality Date   BREAST BIOPSY Right 12/20/2019   x2   BREAST LUMPECTOMY Right 01/22/2020   BREAST LUMPECTOMY WITH RADIOACTIVE SEED AND SENTINEL LYMPH NODE BIOPSY Right 01/22/2020   Procedure: RIGHT BREAST LUMPECTOMY WITH RADIOACTIVE SEED X2 AND RIGHT SENTINEL LYMPH NODE MAPPING;  Surgeon: Erroll Luna, MD;  Location: [place];  Service: General;  Laterality: Right;   BREAST SURGERY Right 03/1998   breast biopsy, benign   EYE SURGERY Right    PORT-A-CATH REMOVAL Right 04/30/2021   Procedure: REMOVAL PORT-A-CATH;  Surgeon: Erroll Luna, MD;  Location: [place];  Service: General;  Laterality: Right;   PORTACATH PLACEMENT Right 01/22/2020   Procedure: INSERTION PORT-A-CATH WITH ULTRASOUND;  Surgeon: Erroll Luna, MD;  Location: [place];  Service: General;  Laterality: Right;   PORTACATH PLACEMENT Right 02/28/2020   Procedure: PORT A CATH REVISION;  Surgeon: Erroll Luna, MD;  Location: [place];  Service: General;  Laterality: Right;   REVERSE SHOULDER ARTHROPLASTY Left 02/02/2021   Procedure: LEFT REVERSE SHOULDER ARTHROPLASTY;  Surgeon: Meredith Pel, MD;  Location: [place];  Service: Orthopedics;  Laterality: Left;   SHOULDER SURGERY Right    SPINAL FUSION  03/04/2011   with ORIF    There were no vitals filed for this visit.   Subjective Assessment - 06/16/21 1313     Subjective Pt arriving today reporting 2/10 left shoulder pain. Pt still reproting pain when first waking up.    Pertinent History anxiety, breast cancer with lumpectomy and radioactive seed placement 2021 and recent port removal, chronic pain, concussion 2008, HTN, depression, spinal fusion 2012.    Patient Stated Goals improve function of Lt arm, raise shoulder over her head to do her hair    Currently in Pain? Yes    Pain Score 2     Pain Location Shoulder    Pain Orientation Left    Pain Descriptors / Indicators Aching    Pain Type Surgical pain    Pain Onset More than a month ago  [facility] PT Assessment - 06/16/21 0001       Assessment   Medical Diagnosis Z96.612 (ICD-10-CM) - History of arthroplasty of left shoulder    Referring Provider (PT) Magnant, Gerrianne Scale, PA-C    Onset Date/Surgical Date 02/02/21      AROM   Overall AROM Comments in supine    Left Shoulder Flexion 140 Degrees    Left Shoulder ABduction 110 Degrees    Left Shoulder External Rotation 40 Degrees   shoulder abd to 90 degrees     PROM   Overall PROM Comments in supine    Left Shoulder Flexion 145 Degrees    Left Shoulder ABduction 130 Degrees    Left Shoulder External Rotation 65 Degrees   shoulder abd to 90 degrees                          [facility] Adult PT Treatment/Exercise - 06/16/21 0001       Shoulder  Exercises: Seated   Flexion AROM;Both;10 reps    Abduction AROM;Both;10 reps    Other Seated Exercises seated isometrics pushing against PT for flexion,extension, abd, IR.ER X 10 ea 5 sec      Shoulder Exercises: Standing   Other Standing Exercises UE Ranger flexion, scaption, circles clockwise and counter clockwise      Shoulder Exercises: Pulleys   Flexion 3 minutes    ABduction 3 minutes      Shoulder Exercises: ROM/Strengthening   UBE (Upper Arm Bike) L2 6 minutes (3 forward/2.5 back)      Vasopneumatic   Number Minutes Vasopneumatic  --    Vasopnuematic Location  --    Vasopneumatic Pressure --    Vasopneumatic Temperature  --      Manual Therapy   Manual therapy comments PROM left shoulder to tolerance all planes                      PT Short Term Goals - 06/02/21 1448       PT SHORT TERM GOAL #1   Title independent with intial HEP    Baseline progressed today    Status Achieved      PT SHORT TERM GOAL #2   Title improve Lt shoulder passive ER to 30 deg    Baseline passive ER left shoulder 40 degrees    Status Achieved               PT Long Term Goals - 06/16/21 1338       PT LONG TERM GOAL #1   Title independet with final HEP    Status On-going      PT LONG TERM GOAL #2   Title Lt shoulder active flexion and abduction improved to at least 90 deg in order to perform ADLs    Status Achieved      PT LONG TERM GOAL #3   Title report pain < 3/10 with activity for improved function    Status On-going      PT LONG TERM GOAL #4   Title FOTO score improved to 61 for improved function    Status On-going      PT LONG TERM GOAL #5   Title demonstrate at least 3/5 Lt shoulder flex/abduction strength for improved function    Status On-going                   Plan - 06/16/21 1317     Clinical Impression  Statement Pt progressing with ROM and gentle strengthening.Active and pasive ROM updated this visit.  Pt still with discomfort  with end ranges of passive ROM. Continue skilled PT as pt tolerates progressing toward LTG's set.    Personal Factors and Comorbidities Comorbidity 3+    Comorbidities anxiety, breast cancer with lumpectomy and radioactive seed placement 2021 and recent port removal, chronic pain, concussion 2008, HTN, depression, spinal fusion 2012    Examination-Activity Limitations Locomotion Level;Reach Overhead;Dressing;Hygiene/Grooming;Lift;Toileting;Bathing    Examination-Participation Restrictions Cleaning;Meal Prep;Community Activity;Driving;Laundry    Stability/Clinical Decision Making Evolving/Moderate complexity    Rehab Potential Good    PT Frequency 2x / week    PT Duration 12 weeks    PT Treatment/Interventions ADLs/Self Care Home Management;Therapeutic exercise;Patient/family education;Cryotherapy;DME Instruction;Ultrasound;Moist Heat;Functional mobility training;Therapeutic activities;Balance training;Neuromuscular re-education;Manual techniques;Vasopneumatic Device;Taping;Dry needling;Passive range of motion    PT Next Visit Plan progress ROM and srengthening    PT Home Exercise Plan Access Code: [email or web address], added AROM flexion and abd against gravity in sitting and ER with red band    Consulted and Agree with Plan of Care Patient             Patient will benefit from skilled therapeutic intervention in order to improve the following deficits and impairments:  Postural dysfunction, Decreased range of motion, Decreased knowledge of precautions, Increased edema, Impaired UE functional use, Decreased strength, Decreased mobility, Decreased balance, Pain  Visit Diagnosis: Acute pain of left shoulder  Stiffness of left shoulder, not elsewhere classified  Abnormal posture  Repeated falls  Localized edema  Muscle weakness (generalized)  Difficulty in walking, not elsewhere classified     Problem List Patient Active Problem List   Diagnosis Date Noted   Arthritis of left shoulder  region    S/P reverse total shoulder arthroplasty, 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   Vitamin D deficiency 01/15/2021   Vitamin B12 deficiency 01/15/2021   Primary insomnia 01/15/2021   Morbid obesity ([place]) 01/15/2021   Moderate recurrent major depression ([place]) 01/15/2021   Benzodiazepine dependence ([place]) 01/15/2021   Port-A-Cath in place 07/22/2020   Left leg cellulitis 04/17/2020   Cellulitis of left leg 04/17/2020   Hypokalemia 04/17/2020   Macrocytic anemia 04/17/2020   Anxiety 04/17/2020   Depression 04/17/2020   Chronic diarrhea 04/17/2020   Chemotherapy induced diarrhea 04/17/2020   Malignant neoplasm of upper-inner quadrant of right breast in female, estrogen receptor positive ([place]) 12/26/2019   Encephalopathy 02/02/2018   Hypothyroidism 02/02/2018   AKI (acute kidney injury) ([place]) 02/02/2018   Chronic back pain 02/02/2018   Alcohol abuse 02/02/2018   Weight gain 02/02/2018   Benign essential HTN 02/02/2018    Oretha Caprice, PT, MPT 06/16/2021, 1:44 PM  [facility] Physical Therapy [address] [place], [place], [address] Phone: [phone]   Fax:  [phone]  Name: KYNDRA CONDRON MRN: [id number] Date of Birth: 04/22/1951

## 2021-06-17 ENCOUNTER — Other Ambulatory Visit: Payer: Self-pay

## 2021-06-17 ENCOUNTER — Ambulatory Visit (INDEPENDENT_AMBULATORY_CARE_PROVIDER_SITE_OTHER): Payer: Medicare PPO | Admitting: Orthopedic Surgery

## 2021-06-17 DIAGNOSIS — Z96612 Presence of left artificial shoulder joint: Secondary | ICD-10-CM

## 2021-06-17 MED ORDER — TRAMADOL HCL 50 MG PO TABS: ORAL_TABLET | ORAL | 1 refills | Status: DC

## 2021-06-18 ENCOUNTER — Ambulatory Visit (INDEPENDENT_AMBULATORY_CARE_PROVIDER_SITE_OTHER): Payer: Medicare PPO | Admitting: Physical Therapy

## 2021-06-18 DIAGNOSIS — R600 Localized edema: Secondary | ICD-10-CM | POA: Diagnosis not present

## 2021-06-18 DIAGNOSIS — M25512 Pain in left shoulder: Secondary | ICD-10-CM

## 2021-06-18 DIAGNOSIS — R293 Abnormal posture: Secondary | ICD-10-CM

## 2021-06-18 DIAGNOSIS — M6281 Muscle weakness (generalized): Secondary | ICD-10-CM | POA: Diagnosis not present

## 2021-06-18 DIAGNOSIS — M25612 Stiffness of left shoulder, not elsewhere classified: Secondary | ICD-10-CM | POA: Diagnosis not present

## 2021-06-18 DIAGNOSIS — R296 Repeated falls: Secondary | ICD-10-CM

## 2021-06-18 NOTE — Therapy (Signed)
[facility] Physical Therapy [address] [place], [place], [address] Phone: [phone]   Fax:  [phone]  Physical Therapy Treatment  Patient Details  Name: [name] MRN: [id number] Date of Birth: 1951-10-08 Referring Provider (PT): Donella Stade, [place]   Encounter Date: 06/18/2021   PT End of Session - 06/18/21 1332     Visit Number 15    Number of Visits 24    Date for PT Re-Evaluation 07/16/21    Authorization Type [employer]    Authorization - Visit Number 3    Authorization - Number of Visits 12    PT Start Time 3790    PT Stop Time 1348    PT Time Calculation (min) 43 min             Past Medical History:  Diagnosis Date   Anxiety    Atrial fibrillation [facility])    sees Dr. Audie Box   Cancer [facility])    breast   Chronic pain    Concussion 02/2007   ICU x 3 days   Depression    Hypertension    Hypothyroidism    Personal history of chemotherapy    Personal history of radiation therapy    Spinal stenosis    Thyroid disease ?1994    Past Surgical History:  Procedure Laterality Date   BREAST BIOPSY Right 12/20/2019   x2   BREAST LUMPECTOMY Right 01/22/2020   BREAST LUMPECTOMY WITH RADIOACTIVE SEED AND SENTINEL LYMPH NODE BIOPSY Right 01/22/2020   Procedure: RIGHT BREAST LUMPECTOMY WITH RADIOACTIVE SEED X2 AND RIGHT SENTINEL LYMPH NODE MAPPING;  Surgeon: Erroll Luna, MD;  Location: [place];  Service: General;  Laterality: Right;   BREAST SURGERY Right 03/1998   breast biopsy, benign   EYE SURGERY Right    PORT-A-CATH REMOVAL Right 04/30/2021   Procedure: REMOVAL PORT-A-CATH;  Surgeon: Erroll Luna, MD;  Location: [place];  Service: General;  Laterality: Right;   PORTACATH PLACEMENT Right 01/22/2020   Procedure: INSERTION PORT-A-CATH WITH ULTRASOUND;  Surgeon: Erroll Luna, MD;  Location: [place];  Service: General;  Laterality: Right;   PORTACATH PLACEMENT Right  02/28/2020   Procedure: PORT A CATH REVISION;  Surgeon: Erroll Luna, MD;  Location: [place];  Service: General;  Laterality: Right;   REVERSE SHOULDER ARTHROPLASTY Left 02/02/2021   Procedure: LEFT REVERSE SHOULDER ARTHROPLASTY;  Surgeon: Meredith Pel, MD;  Location: [place];  Service: Orthopedics;  Laterality: Left;   SHOULDER SURGERY Right    SPINAL FUSION  03/04/2011   with ORIF    There were no vitals filed for this visit.   Subjective Assessment - 06/18/21 1317     Subjective Pt states she had good MD follow up and MD was pleased with her progress at this point. She still complains of not being able to groom her hair with her left arm.    Pertinent History anxiety, breast cancer with lumpectomy and radioactive seed placement 2021 and recent port removal, chronic pain, concussion 2008, HTN, depression, spinal fusion 2012.    Patient Stated Goals improve function of Lt arm, raise shoulder over her head to do her hair    Pain Onset More than a month ago                               [facility] Adult PT Treatment/Exercise - 06/18/21 0001       Shoulder Exercises: Seated  Flexion AROM;Both;10 reps    Flexion Limitations 2 sets    Abduction AROM;Both;10 reps    ABduction Limitations 2 sets    Other Seated Exercises seated isometrics pushing against PT for flexion,extension, abd, IR.ER X 10 ea 5 sec      Shoulder Exercises: Standing   Other Standing Exercises wall ladder X5 flexion, X 5 abd holding 5 sec    Other Standing Exercises UE Ranger flexion, scaption, circles clockwise and counter clockwise X10 ea      Shoulder Exercises: Pulleys   Flexion 3 minutes    ABduction 3 minutes      Vasopneumatic   Number Minutes Vasopneumatic  10 minutes    Vasopnuematic Location  Shoulder    Vasopneumatic Pressure Low    Vasopneumatic Temperature  34      Manual Therapy   Manual therapy comments PROM left shoulder to tolerance all planes                       PT Short Term Goals - 06/02/21 1448       PT SHORT TERM GOAL #1   Title independent with intial HEP    Baseline progressed today    Status Achieved      PT SHORT TERM GOAL #2   Title improve Lt shoulder passive ER to 30 deg    Baseline passive ER left shoulder 40 degrees    Status Achieved               PT Long Term Goals - 06/16/21 1338       PT LONG TERM GOAL #1   Title independet with final HEP    Status On-going      PT LONG TERM GOAL #2   Title Lt shoulder active flexion and abduction improved to at least 90 deg in order to perform ADLs    Status Achieved      PT LONG TERM GOAL #3   Title report pain < 3/10 with activity for improved function    Status On-going      PT LONG TERM GOAL #4   Title FOTO score improved to 61 for improved function    Status On-going      PT LONG TERM GOAL #5   Title demonstrate at least 3/5 Lt shoulder flex/abduction strength for improved function    Status On-going                   Plan - 06/18/21 1348     Clinical Impression Statement Continued to work to improve strength and ROM in her left shoulder with good overall tolerance today to increased activity. Continue POC to maximize her function.    Personal Factors and Comorbidities Comorbidity 3+    Comorbidities anxiety, breast cancer with lumpectomy and radioactive seed placement 2021 and recent port removal, chronic pain, concussion 2008, HTN, depression, spinal fusion 2012    Examination-Activity Limitations Locomotion Level;Reach Overhead;Dressing;Hygiene/Grooming;Lift;Toileting;Bathing    Examination-Participation Restrictions Cleaning;Meal Prep;Community Activity;Driving;Laundry    Stability/Clinical Decision Making Evolving/Moderate complexity    Rehab Potential Good    PT Frequency 2x / week    PT Duration 12 weeks    PT Treatment/Interventions ADLs/Self Care Home Management;Therapeutic exercise;Patient/family education;Cryotherapy;DME  Instruction;Ultrasound;Moist Heat;Functional mobility training;Therapeutic activities;Balance training;Neuromuscular re-education;Manual techniques;Vasopneumatic Device;Taping;Dry needling;Passive range of motion    PT Next Visit Plan progress ROM and srengthening    PT Home Exercise Plan Access Code: [email or web address], added AROM flexion and abd against gravity in  sitting and ER with red band    Consulted and Agree with Plan of Care Patient             Patient will benefit from skilled therapeutic intervention in order to improve the following deficits and impairments:  Postural dysfunction, Decreased range of motion, Decreased knowledge of precautions, Increased edema, Impaired UE functional use, Decreased strength, Decreased mobility, Decreased balance, Pain  Visit Diagnosis: Acute pain of left shoulder  Stiffness of left shoulder, not elsewhere classified  Abnormal posture  Repeated falls  Localized edema  Muscle weakness (generalized)     Problem List Patient Active Problem List   Diagnosis Date Noted   Arthritis of left shoulder region    S/P reverse total shoulder arthroplasty, 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   Vitamin D deficiency 01/15/2021   Vitamin B12 deficiency 01/15/2021   Primary insomnia 01/15/2021   Morbid obesity ([place]) 01/15/2021   Moderate recurrent major depression ([place]) 01/15/2021   Benzodiazepine dependence ([place]) 01/15/2021   Port-A-Cath in place 07/22/2020   Left leg cellulitis 04/17/2020   Cellulitis of left leg 04/17/2020   Hypokalemia 04/17/2020   Macrocytic anemia 04/17/2020   Anxiety 04/17/2020   Depression 04/17/2020   Chronic diarrhea 04/17/2020   Chemotherapy induced diarrhea 04/17/2020   Malignant neoplasm of upper-inner quadrant of right breast in female, estrogen receptor positive ([place]) 12/26/2019   Encephalopathy 02/02/2018   Hypothyroidism 02/02/2018   AKI (acute kidney injury) ([place]) 02/02/2018   Chronic back pain 02/02/2018   Alcohol abuse 02/02/2018    Weight gain 02/02/2018   Benign essential HTN 02/02/2018    Silvestre Mesi 06/18/2021, 1:50 PM  [facility] Physical Therapy [address] [place], [place], [address] Phone: [phone]   Fax:  [phone]  Name: [name] MRN: [id number] Date of Birth: 1951/04/24

## 2021-06-20 ENCOUNTER — Encounter: Payer: Self-pay | Admitting: Orthopedic Surgery

## 2021-06-20 NOTE — Progress Notes (Signed)
Post-Op Visit Note   Patient: [name]           Date of Birth: 04-01-1951           MRN: [id number] Visit Date: 06/17/2021 PCP: Maurice Small, MD   Assessment & Plan:  Chief Complaint:  Chief Complaint  Patient presents with   Left Shoulder - Routine Post Op   Visit Diagnoses:  1. History of arthroplasty of left shoulder     Plan: Rochelle is a 70 year old female left reverse shoulder replacement 02/02/2021.  Making slow progress.  On exam she has improved abduction and forward flexion.  Continue with therapy 2 times a week plus home exercise program daily.  Refill tramadol.  50-month return.  Bone quality poor at the time of fixation but looks reasonable at this time in terms of the progress she is making.  Plain radiographs on return  Follow-Up Instructions: Return in about 3 months (around 09/17/2021).   Orders:  No orders of the defined types were placed in this encounter.  Meds ordered this encounter  Medications   traMADol (ULTRAM) 50 MG tablet    Sig: 1 po q d prn pain    Dispense:  30 tablet    Refill:  1    Imaging: No results found.  PMFS History: Patient Active Problem List   Diagnosis Date Noted   Arthritis of left shoulder region    S/P reverse total shoulder arthroplasty, 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   Vitamin D deficiency 01/15/2021   Vitamin B12 deficiency 01/15/2021   Primary insomnia 01/15/2021   Morbid obesity ([place]) 01/15/2021   Moderate recurrent major depression ([place]) 01/15/2021   Benzodiazepine dependence ([place]) 01/15/2021   Port-A-Cath in place 07/22/2020   Left leg cellulitis 04/17/2020   Cellulitis of left leg 04/17/2020   Hypokalemia 04/17/2020   Macrocytic anemia 04/17/2020   Anxiety 04/17/2020   Depression 04/17/2020   Chronic diarrhea 04/17/2020   Chemotherapy induced diarrhea 04/17/2020   Malignant neoplasm of upper-inner quadrant of right breast in female, estrogen receptor positive ([place]) 12/26/2019   Encephalopathy 02/02/2018    Hypothyroidism 02/02/2018   AKI (acute kidney injury) ([place]) 02/02/2018   Chronic back pain 02/02/2018   Alcohol abuse 02/02/2018   Weight gain 02/02/2018   Benign essential HTN 02/02/2018   Past Medical History:  Diagnosis Date   Anxiety    Atrial fibrillation [facility])    sees Dr. Audie Box   Cancer [facility])    breast   Chronic pain    Concussion 02/2007   ICU x 3 days   Depression    Hypertension    Hypothyroidism    Personal history of chemotherapy    Personal history of radiation therapy    Spinal stenosis    Thyroid disease ?1994    Family History  Problem Relation Age of Onset   Thyroid disease Mother    Dementia Mother    Diabetes Father    Stroke Father    Hypertension Sister    Diabetes Sister     Past Surgical History:  Procedure Laterality Date   BREAST BIOPSY Right 12/20/2019   x2   BREAST LUMPECTOMY Right 01/22/2020   BREAST LUMPECTOMY WITH RADIOACTIVE SEED AND SENTINEL LYMPH NODE BIOPSY Right 01/22/2020   Procedure: RIGHT BREAST LUMPECTOMY WITH RADIOACTIVE SEED X2 AND RIGHT SENTINEL LYMPH NODE MAPPING;  Surgeon: Erroll Luna, MD;  Location: [place];  Service: General;  Laterality: Right;   BREAST SURGERY Right 03/1998   breast biopsy, benign  EYE SURGERY Right    PORT-A-CATH REMOVAL Right 04/30/2021   Procedure: REMOVAL PORT-A-CATH;  Surgeon: Erroll Luna, MD;  Location: [place];  Service: General;  Laterality: Right;   PORTACATH PLACEMENT Right 01/22/2020   Procedure: INSERTION PORT-A-CATH WITH ULTRASOUND;  Surgeon: Erroll Luna, MD;  Location: [place];  Service: General;  Laterality: Right;   PORTACATH PLACEMENT Right 02/28/2020   Procedure: PORT A CATH REVISION;  Surgeon: Erroll Luna, MD;  Location: [place];  Service: General;  Laterality: Right;   REVERSE SHOULDER ARTHROPLASTY Left 02/02/2021   Procedure: LEFT REVERSE SHOULDER ARTHROPLASTY;  Surgeon: Meredith Pel, MD;  Location: [place];   Service: Orthopedics;  Laterality: Left;   SHOULDER SURGERY Right    SPINAL FUSION  03/04/2011   with ORIF   Social History   Occupational History   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

## 2021-06-23 ENCOUNTER — Encounter: Payer: Medicare PPO | Admitting: Physical Therapy

## 2021-06-25 ENCOUNTER — Ambulatory Visit (INDEPENDENT_AMBULATORY_CARE_PROVIDER_SITE_OTHER): Payer: Medicare PPO | Admitting: Physical Therapy

## 2021-06-25 ENCOUNTER — Other Ambulatory Visit: Payer: Self-pay

## 2021-06-25 ENCOUNTER — Encounter: Payer: Self-pay | Admitting: Physical Therapy

## 2021-06-25 DIAGNOSIS — R296 Repeated falls: Secondary | ICD-10-CM

## 2021-06-25 DIAGNOSIS — R600 Localized edema: Secondary | ICD-10-CM | POA: Diagnosis not present

## 2021-06-25 DIAGNOSIS — R293 Abnormal posture: Secondary | ICD-10-CM | POA: Diagnosis not present

## 2021-06-25 DIAGNOSIS — M6281 Muscle weakness (generalized): Secondary | ICD-10-CM

## 2021-06-25 DIAGNOSIS — M25512 Pain in left shoulder: Secondary | ICD-10-CM | POA: Diagnosis not present

## 2021-06-25 DIAGNOSIS — M25612 Stiffness of left shoulder, not elsewhere classified: Secondary | ICD-10-CM | POA: Diagnosis not present

## 2021-06-25 NOTE — Therapy (Signed)
[facility] [address] [place], [place], [address] Phone: [phone]   Fax:  [phone]  Physical Therapy Treatment  Patient Details  Name: [name] MRN: [id number] Date of Birth: [birth year] Referring Provider (PT): Donella Stade, [place]   Encounter Date: 06/25/2021   PT End of Session - 06/25/21 1341     Visit Number 16    Number of Visits 24    Date for PT Re-Evaluation 07/16/21    Authorization Type [employer]    Authorization Time Period 6/16-7/28    Authorization - Visit Number 4    Authorization - Number of Visits 12    Progress Note Due on Visit 20    PT Start Time 1300    PT Stop Time 1349    PT Time Calculation (min) 49 min    Activity Tolerance Patient tolerated treatment well    Behavior During Therapy [facility] for tasks assessed/performed             Past Medical History:  Diagnosis Date   Anxiety    Atrial fibrillation [facility])    sees Dr. Audie Box   Cancer [facility])    breast   Chronic pain    Concussion 02/2007   ICU x 3 days   Depression    Hypertension    Hypothyroidism    Personal history of chemotherapy    Personal history of radiation therapy    Spinal stenosis    Thyroid disease ?1994    Past Surgical History:  Procedure Laterality Date   BREAST BIOPSY Right 12/20/2019   x2   BREAST LUMPECTOMY Right 01/22/2020   BREAST LUMPECTOMY WITH RADIOACTIVE SEED AND SENTINEL LYMPH NODE BIOPSY Right 01/22/2020   Procedure: RIGHT BREAST LUMPECTOMY WITH RADIOACTIVE SEED X2 AND RIGHT SENTINEL LYMPH NODE MAPPING;  Surgeon: Erroll Luna, MD;  Location: [place];  Service: General;  Laterality: Right;   BREAST SURGERY Right 03/1998   breast biopsy, benign   EYE SURGERY Right    PORT-A-CATH REMOVAL Right 04/30/2021   Procedure: REMOVAL PORT-A-CATH;  Surgeon: Erroll Luna, MD;  Location: [place];  Service: General;  Laterality: Right;   PORTACATH PLACEMENT Right 01/22/2020    Procedure: INSERTION PORT-A-CATH WITH ULTRASOUND;  Surgeon: Erroll Luna, MD;  Location: [place];  Service: General;  Laterality: Right;   PORTACATH PLACEMENT Right 02/28/2020   Procedure: PORT A CATH REVISION;  Surgeon: Erroll Luna, MD;  Location: [place];  Service: General;  Laterality: Right;   REVERSE SHOULDER ARTHROPLASTY Left 02/02/2021   Procedure: LEFT REVERSE SHOULDER ARTHROPLASTY;  Surgeon: Meredith Pel, MD;  Location: [place];  Service: Orthopedics;  Laterality: Left;   SHOULDER SURGERY Right    SPINAL FUSION  03/04/2011   with ORIF    There were no vitals filed for this visit.   Subjective Assessment - 06/25/21 1304     Subjective shoulder pain is getting better; still having a hard time with reaching her hair    Pertinent History anxiety, breast cancer with lumpectomy and radioactive seed placement 2021 and recent port removal, chronic pain, concussion 2008, HTN, depression, spinal fusion 2012.    Patient Stated Goals improve function of Lt arm, raise shoulder over her head to do her hair    Currently in Pain? Yes    Pain Score 3     Pain Location Shoulder    Pain Orientation Left    Pain Descriptors / Indicators Aching    Pain Onset More  than a month ago    Pain Frequency Constant    Aggravating Factors  moving it around    Pain Relieving Factors medication                               [facility] Adult PT Treatment/Exercise - 06/25/21 1304       Shoulder Exercises: Supine   Flexion AROM;Left;20 reps    Shoulder Flexion Weight (lbs) 1   wrist weight     Shoulder Exercises: Sidelying   ABduction Left;20 reps;Weights    ABduction Weight (lbs) 1   wrist weight   Other Sidelying Exercises LUE reaching up and over/across top of head x 20 reps with 1# wrist weight      Shoulder Exercises: Standing   ABduction 20 reps;Left   Passive concentric; active eccentric   Other Standing Exercises wall ladder X10 flexion, X10 abd (6  reps with weight) holding 5 sec; 1# wrist weight      Shoulder Exercises: Pulleys   Flexion 3 minutes    ABduction 3 minutes      Vasopneumatic   Number Minutes Vasopneumatic  10 minutes    Vasopnuematic Location  Shoulder    Vasopneumatic Pressure Low    Vasopneumatic Temperature  34      Manual Therapy   Manual therapy comments PROM left shoulder to tolerance all planes                      PT Short Term Goals - 06/02/21 1448       PT SHORT TERM GOAL #1   Title independent with intial HEP    Baseline progressed today    Status Achieved      PT SHORT TERM GOAL #2   Title improve Lt shoulder passive ER to 30 deg    Baseline passive ER left shoulder 40 degrees    Status Achieved               PT Long Term Goals - 06/16/21 1338       PT LONG TERM GOAL #1   Title independet with final HEP    Status On-going      PT LONG TERM GOAL #2   Title Lt shoulder active flexion and abduction improved to at least 90 deg in order to perform ADLs    Status Achieved      PT LONG TERM GOAL #3   Title report pain < 3/10 with activity for improved function    Status On-going      PT LONG TERM GOAL #4   Title FOTO score improved to 61 for improved function    Status On-going      PT LONG TERM GOAL #5   Title demonstrate at least 3/5 Lt shoulder flex/abduction strength for improved function    Status On-going                   Plan - 06/25/21 1342     Clinical Impression Statement Pt tolerated session well with continued focus on light strengthening activities.  Will continue to benefit from PT to maximize function.    Personal Factors and Comorbidities Comorbidity 3+    Comorbidities anxiety, breast cancer with lumpectomy and radioactive seed placement 2021 and recent port removal, chronic pain, concussion 2008, HTN, depression, spinal fusion 2012    Examination-Activity Limitations Locomotion Level;Reach  Overhead;Dressing;Hygiene/Grooming;Lift;Toileting;Bathing    Examination-Participation Restrictions Cleaning;Meal  Prep;Community Activity;Driving;Laundry    Stability/Clinical Decision Making Evolving/Moderate complexity    Rehab Potential Good    PT Frequency 2x / week    PT Duration 12 weeks    PT Treatment/Interventions ADLs/Self Care Home Management;Therapeutic exercise;Patient/family education;Cryotherapy;DME Instruction;Ultrasound;Moist Heat;Functional mobility training;Therapeutic activities;Balance training;Neuromuscular re-education;Manual techniques;Vasopneumatic Device;Taping;Dry needling;Passive range of motion    PT Next Visit Plan progress ROM and strengthening, supine/sidelying exercises to fatigue    PT Home Exercise Plan Access Code: [email or web address], added AROM flexion and abd against gravity in sitting and ER with red band    Consulted and Agree with Plan of Care Patient             Patient will benefit from skilled therapeutic intervention in order to improve the following deficits and impairments:  Postural dysfunction, Decreased range of motion, Decreased knowledge of precautions, Increased edema, Impaired UE functional use, Decreased strength, Decreased mobility, Decreased balance, Pain  Visit Diagnosis: Acute pain of left shoulder  Stiffness of left shoulder, not elsewhere classified  Abnormal posture  Repeated falls  Localized edema  Muscle weakness (generalized)     Problem List Patient Active Problem List   Diagnosis Date Noted   Arthritis of left shoulder region    S/P reverse total shoulder arthroplasty, 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   Vitamin D deficiency 01/15/2021   Vitamin B12 deficiency 01/15/2021   Primary insomnia 01/15/2021   Morbid obesity ([place]) 01/15/2021   Moderate recurrent major depression ([place]) 01/15/2021   Benzodiazepine dependence ([place]) 01/15/2021   Port-A-Cath in place 07/22/2020   Left leg cellulitis 04/17/2020   Cellulitis of left leg  04/17/2020   Hypokalemia 04/17/2020   Macrocytic anemia 04/17/2020   Anxiety 04/17/2020   Depression 04/17/2020   Chronic diarrhea 04/17/2020   Chemotherapy induced diarrhea 04/17/2020   Malignant neoplasm of upper-inner quadrant of right breast in female, estrogen receptor positive ([place]) 12/26/2019   Encephalopathy 02/02/2018   Hypothyroidism 02/02/2018   AKI (acute kidney injury) ([place]) 02/02/2018   Chronic back pain 02/02/2018   Alcohol abuse 02/02/2018   Weight gain 02/02/2018   Benign essential HTN 02/02/2018      Laureen Abrahams, PT, DPT 06/25/21 1:43 PM     [facility] [address] [place], [place], [address] Phone: [phone]   Fax:  [phone]  Name: [name] MRN: [id number] Date of Birth: 08/07/1951

## 2021-06-30 ENCOUNTER — Other Ambulatory Visit: Payer: Self-pay

## 2021-06-30 ENCOUNTER — Ambulatory Visit (INDEPENDENT_AMBULATORY_CARE_PROVIDER_SITE_OTHER): Payer: Medicare PPO | Admitting: Physical Therapy

## 2021-06-30 DIAGNOSIS — M6281 Muscle weakness (generalized): Secondary | ICD-10-CM

## 2021-06-30 DIAGNOSIS — R262 Difficulty in walking, not elsewhere classified: Secondary | ICD-10-CM

## 2021-06-30 DIAGNOSIS — R293 Abnormal posture: Secondary | ICD-10-CM | POA: Diagnosis not present

## 2021-06-30 DIAGNOSIS — R296 Repeated falls: Secondary | ICD-10-CM

## 2021-06-30 DIAGNOSIS — M25512 Pain in left shoulder: Secondary | ICD-10-CM | POA: Diagnosis not present

## 2021-06-30 DIAGNOSIS — R600 Localized edema: Secondary | ICD-10-CM | POA: Diagnosis not present

## 2021-06-30 DIAGNOSIS — M25612 Stiffness of left shoulder, not elsewhere classified: Secondary | ICD-10-CM | POA: Diagnosis not present

## 2021-06-30 NOTE — Therapy (Signed)
[facility] [address] [place], [place], [address] Phone: [phone]   Fax:  [phone]  Physical Therapy Treatment  Patient Details  Name: [name] MRN: [id number] Date of Birth: [birth year] Referring Provider (PT): Donella Stade, [place]   Encounter Date: 06/30/2021   PT End of Session - 06/30/21 1338     Visit Number 17    Number of Visits 24    Date for PT Re-Evaluation 07/16/21    Authorization Type [employer]    Authorization Time Period 6/16-7/28    Authorization - Visit Number 5    Authorization - Number of Visits 12    Progress Note Due on Visit 20    PT Start Time 1300    PT Stop Time 1345    PT Time Calculation (min) 45 min    Activity Tolerance Patient tolerated treatment well    Behavior During Therapy [facility] for tasks assessed/performed             Past Medical History:  Diagnosis Date   Anxiety    Atrial fibrillation [facility])    sees Dr. Audie Box   Cancer [facility])    breast   Chronic pain    Concussion 02/2007   ICU x 3 days   Depression    Hypertension    Hypothyroidism    Personal history of chemotherapy    Personal history of radiation therapy    Spinal stenosis    Thyroid disease ?1994    Past Surgical History:  Procedure Laterality Date   BREAST BIOPSY Right 12/20/2019   x2   BREAST LUMPECTOMY Right 01/22/2020   BREAST LUMPECTOMY WITH RADIOACTIVE SEED AND SENTINEL LYMPH NODE BIOPSY Right 01/22/2020   Procedure: RIGHT BREAST LUMPECTOMY WITH RADIOACTIVE SEED X2 AND RIGHT SENTINEL LYMPH NODE MAPPING;  Surgeon: Erroll Luna, MD;  Location: [place];  Service: General;  Laterality: Right;   BREAST SURGERY Right 03/1998   breast biopsy, benign   EYE SURGERY Right    PORT-A-CATH REMOVAL Right 04/30/2021   Procedure: REMOVAL PORT-A-CATH;  Surgeon: Erroll Luna, MD;  Location: [place];  Service: General;  Laterality: Right;   PORTACATH PLACEMENT Right 01/22/2020    Procedure: INSERTION PORT-A-CATH WITH ULTRASOUND;  Surgeon: Erroll Luna, MD;  Location: [place];  Service: General;  Laterality: Right;   PORTACATH PLACEMENT Right 02/28/2020   Procedure: PORT A CATH REVISION;  Surgeon: Erroll Luna, MD;  Location: [place];  Service: General;  Laterality: Right;   REVERSE SHOULDER ARTHROPLASTY Left 02/02/2021   Procedure: LEFT REVERSE SHOULDER ARTHROPLASTY;  Surgeon: Meredith Pel, MD;  Location: [place];  Service: Orthopedics;  Laterality: Left;   SHOULDER SURGERY Right    SPINAL FUSION  03/04/2011   with ORIF    There were no vitals filed for this visit.   Subjective Assessment - 06/30/21 1322     Subjective Pt arriving today reporting 2/10 in shoulder. Pt reports sleeping really well last night but had a hard time the night before. Pt also reporting mild back issue today with 4/10 pain reported. Pt reporting this morning when she woke up it was about a 10/10.    Pertinent History anxiety, breast cancer with lumpectomy and radioactive seed placement 2021 and recent port removal, chronic pain, concussion 2008, HTN, depression, spinal fusion 2012.    Patient Stated Goals improve function of Lt arm, raise shoulder over her head to do her hair    Currently in Pain? Yes  Pain Score 2     Pain Location Shoulder    Pain Orientation Left    Pain Descriptors / Indicators Aching    Pain Type Surgical pain    Pain Onset More than a month ago    Multiple Pain Sites Yes    Pain Score 4    Pain Location Back    Pain Orientation Lower    Pain Descriptors / Indicators Aching;Sore;Discomfort    Pain Type Acute pain                               [facility] Adult PT Treatment/Exercise - 06/30/21 0001       Shoulder Exercises: Supine   Flexion AROM;Left;20 reps    Shoulder Flexion Weight (lbs) 1# bar    Other Supine Exercises shoulder at 90 degrees, circles x 15 in both directions      Shoulder Exercises: Seated    Other Seated Exercises --      Shoulder Exercises: Sidelying   External Rotation AROM;Left;10 reps    External Rotation Limitations 1    ABduction Left;20 reps;Weights    ABduction Weight (lbs) 1   wrist weight     Shoulder Exercises: Standing   Other Standing Exercises wall ladder x 5 flexion and abduction    Other Standing Exercises Isometric against the wall holding 10 seocnds x 5 reps with flexion, extension, IR and ER      Shoulder Exercises: Pulleys   Flexion 3 minutes    ABduction 3 minutes      Modalities   Modalities Vasopneumatic;Moist Heat      Moist Heat Therapy   Number Minutes Moist Heat 10 Minutes    Moist Heat Location Lumbar Spine      Vasopneumatic   Number Minutes Vasopneumatic  10 minutes    Vasopnuematic Location  Shoulder    Vasopneumatic Pressure Low    Vasopneumatic Temperature  34      Manual Therapy   Manual therapy comments PROM left shoulder to tolerance all planes                      PT Short Term Goals - 06/02/21 1448       PT SHORT TERM GOAL #1   Title independent with intial HEP    Baseline progressed today    Status Achieved      PT SHORT TERM GOAL #2   Title improve Lt shoulder passive ER to 30 deg    Baseline passive ER left shoulder 40 degrees    Status Achieved               PT Long Term Goals - 06/30/21 1344       PT LONG TERM GOAL #1   Title independet with final HEP    Status On-going      PT LONG TERM GOAL #2   Title Lt shoulder active flexion and abduction improved to at least 90 deg in order to perform ADLs    Status Achieved      PT LONG TERM GOAL #3   Title report pain < 3/10 with activity for improved function    Status On-going      PT LONG TERM GOAL #4   Title FOTO score improved to 61 for improved function    Status On-going      PT LONG TERM GOAL #5   Title demonstrate at least 3/5  Lt shoulder flex/abduction strength for improved function    Status On-going                    Plan - 06/30/21 1338     Clinical Impression Statement Treatment today focused on strengtheing and ROM to left shoulder. Pt requiring minimum assistance with supine to sidelying and supine to sit today. Mild limitiations due to low back pain with slower transitions and gait. Moist heat applied to lumbar spine while left shoulder was in vasopneumatic at end of session. Pt reporting relief in both shoulder and back. Continue skilled PT.    Personal Factors and Comorbidities Comorbidity 3+    Comorbidities anxiety, breast cancer with lumpectomy and radioactive seed placement 2021 and recent port removal, chronic pain, concussion 2008, HTN, depression, spinal fusion 2012    Examination-Activity Limitations Locomotion Level;Reach Overhead;Dressing;Hygiene/Grooming;Lift;Toileting;Bathing    Examination-Participation Restrictions Cleaning;Meal Prep;Community Activity;Driving;Laundry    Stability/Clinical Decision Making Evolving/Moderate complexity    Rehab Potential Good    PT Frequency 2x / week    PT Duration 12 weeks    PT Treatment/Interventions ADLs/Self Care Home Management;Therapeutic exercise;Patient/family education;Cryotherapy;DME Instruction;Ultrasound;Moist Heat;Functional mobility training;Therapeutic activities;Balance training;Neuromuscular re-education;Manual techniques;Vasopneumatic Device;Taping;Dry needling;Passive range of motion    PT Next Visit Plan progress ROM and strengthening, supine/sidelying exercises to fatigue    PT Home Exercise Plan Access Code: [email or web address], added AROM flexion and abd against gravity in sitting and ER with red band    Consulted and Agree with Plan of Care Patient             Patient will benefit from skilled therapeutic intervention in order to improve the following deficits and impairments:  Postural dysfunction, Decreased range of motion, Decreased knowledge of precautions, Increased edema, Impaired UE functional use, Decreased  strength, Decreased mobility, Decreased balance, Pain  Visit Diagnosis: Acute pain of left shoulder  Stiffness of left shoulder, not elsewhere classified  Abnormal posture  Repeated falls  Localized edema  Muscle weakness (generalized)  Difficulty in walking, not elsewhere classified     Problem List Patient Active Problem List   Diagnosis Date Noted   Arthritis of left shoulder region    S/P reverse total shoulder arthroplasty, 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   Vitamin D deficiency 01/15/2021   Vitamin B12 deficiency 01/15/2021   Primary insomnia 01/15/2021   Morbid obesity ([place]) 01/15/2021   Moderate recurrent major depression ([place]) 01/15/2021   Benzodiazepine dependence ([place]) 01/15/2021   Port-A-Cath in place 07/22/2020   Left leg cellulitis 04/17/2020   Cellulitis of left leg 04/17/2020   Hypokalemia 04/17/2020   Macrocytic anemia 04/17/2020   Anxiety 04/17/2020   Depression 04/17/2020   Chronic diarrhea 04/17/2020   Chemotherapy induced diarrhea 04/17/2020   Malignant neoplasm of upper-inner quadrant of right breast in female, estrogen receptor positive ([place]) 12/26/2019   Encephalopathy 02/02/2018   Hypothyroidism 02/02/2018   AKI (acute kidney injury) ([place]) 02/02/2018   Chronic back pain 02/02/2018   Alcohol abuse 02/02/2018   Weight gain 02/02/2018   Benign essential HTN 02/02/2018    Oretha Caprice, PT, MPT 06/30/2021, 1:44 PM  [facility] [address] [place], [place], [address] Phone: [phone]   Fax:  [phone]  Name: [name] MRN: [id number] Date of Birth: 1951-06-09

## 2021-07-02 ENCOUNTER — Other Ambulatory Visit: Payer: Self-pay

## 2021-07-02 ENCOUNTER — Ambulatory Visit (INDEPENDENT_AMBULATORY_CARE_PROVIDER_SITE_OTHER): Payer: Medicare PPO | Admitting: Physical Therapy

## 2021-07-02 DIAGNOSIS — R296 Repeated falls: Secondary | ICD-10-CM | POA: Diagnosis not present

## 2021-07-02 DIAGNOSIS — R600 Localized edema: Secondary | ICD-10-CM

## 2021-07-02 DIAGNOSIS — R293 Abnormal posture: Secondary | ICD-10-CM | POA: Diagnosis not present

## 2021-07-02 DIAGNOSIS — M25612 Stiffness of left shoulder, not elsewhere classified: Secondary | ICD-10-CM | POA: Diagnosis not present

## 2021-07-02 DIAGNOSIS — M25512 Pain in left shoulder: Secondary | ICD-10-CM | POA: Diagnosis not present

## 2021-07-02 NOTE — Therapy (Signed)
[facility] Physical Therapy [address] [place], [place], [address] Phone: [phone]   Fax:  [phone]  Physical Therapy Treatment  Patient Details  Name: [name] MRN: [id number] Date of Birth: 05-Apr-1951 Referring Provider (PT): Donella Stade, [place]   Encounter Date: 07/02/2021   PT End of Session - 07/02/21 1403     Visit Number 18    Number of Visits 24    Date for PT Re-Evaluation 07/16/21    Authorization Type [employer]    Authorization Time Period 6/16-7/28    Authorization - Visit Number 6    Authorization - Number of Visits 12    Progress Note Due on Visit 20    PT Start Time 1300    PT Stop Time 1345    PT Time Calculation (min) 45 min    Activity Tolerance Patient tolerated treatment well    Behavior During Therapy [facility] for tasks assessed/performed             Past Medical History:  Diagnosis Date   Anxiety    Atrial fibrillation [facility])    sees Dr. Audie Box   Cancer [facility])    breast   Chronic pain    Concussion 02/2007   ICU x 3 days   Depression    Hypertension    Hypothyroidism    Personal history of chemotherapy    Personal history of radiation therapy    Spinal stenosis    Thyroid disease ?1994    Past Surgical History:  Procedure Laterality Date   BREAST BIOPSY Right 12/20/2019   x2   BREAST LUMPECTOMY Right 01/22/2020   BREAST LUMPECTOMY WITH RADIOACTIVE SEED AND SENTINEL LYMPH NODE BIOPSY Right 01/22/2020   Procedure: RIGHT BREAST LUMPECTOMY WITH RADIOACTIVE SEED X2 AND RIGHT SENTINEL LYMPH NODE MAPPING;  Surgeon: Erroll Luna, MD;  Location: [place];  Service: General;  Laterality: Right;   BREAST SURGERY Right 03/1998   breast biopsy, benign   EYE SURGERY Right    PORT-A-CATH REMOVAL Right 04/30/2021   Procedure: REMOVAL PORT-A-CATH;  Surgeon: Erroll Luna, MD;  Location: [place];  Service: General;  Laterality: Right;   PORTACATH PLACEMENT Right 01/22/2020    Procedure: INSERTION PORT-A-CATH WITH ULTRASOUND;  Surgeon: Erroll Luna, MD;  Location: [place];  Service: General;  Laterality: Right;   PORTACATH PLACEMENT Right 02/28/2020   Procedure: PORT A CATH REVISION;  Surgeon: Erroll Luna, MD;  Location: [place];  Service: General;  Laterality: Right;   REVERSE SHOULDER ARTHROPLASTY Left 02/02/2021   Procedure: LEFT REVERSE SHOULDER ARTHROPLASTY;  Surgeon: Meredith Pel, MD;  Location: [place];  Service: Orthopedics;  Laterality: Left;   SHOULDER SURGERY Right    SPINAL FUSION  03/04/2011   with ORIF    There were no vitals filed for this visit.   Subjective Assessment - 07/02/21 1307     Subjective Pt arriving today relays no pain in her shoulder and does feel it is getting better. Her back pain is severe though however.    Pertinent History anxiety, breast cancer with lumpectomy and radioactive seed placement 2021 and recent port removal, chronic pain, concussion 2008, HTN, depression, spinal fusion 2012.    Patient Stated Goals improve function of Lt arm, raise shoulder over her head to do her hair    Pain Onset More than a month ago                [facility] PT Assessment - 07/02/21 0001  Assessment   Medical Diagnosis [phone] (ICD-10-CM) - History of arthroplasty of left shoulder    Referring Provider (PT) Magnant, Gerrianne Scale, PA-C    Onset Date/Surgical Date 02/02/21      AROM   Overall AROM Comments in standing    Left Shoulder Flexion 120 Degrees    Left Shoulder ABduction 90 Degrees    Left Shoulder Internal Rotation --   WNL reaching behind back   Left Shoulder External Rotation --   to occiput with reach behind head     PROM   Left Shoulder Flexion 160 Degrees    Left Shoulder ABduction 160 Degrees   into scaption   Left Shoulder External Rotation 65 Degrees                           [facility] Adult PT Treatment/Exercise - 07/02/21 0001       Shoulder Exercises: Supine    Flexion Left;15 reps    Shoulder Flexion Weight (lbs) 2    Flexion Limitations also did D1 and D2 flexion X 10 ea 2#      Shoulder Exercises: Sidelying   External Rotation Left;10 reps    External Rotation Limitations 2 sets with 2# , difficulty moving into full sidelying   ABduction Left;15 reps    ABduction Weight (lbs) 2      Shoulder Exercises: Pulleys   Flexion 3 minutes    ABduction 3 minutes      Manual Therapy   Manual therapy comments PROM left shoulder to tolerance all planes, STM with compression to left lower lumbar L4-5              Trigger Point Dry Needling - 07/02/21 0001     Consent Given? Yes    Education Handout Provided Yes    Muscles Treated Back/Hip Lumbar multifidi;Erector spinae    Lumbar multifidi Response Twitch response elicited                    PT Short Term Goals - 06/02/21 1448       PT SHORT TERM GOAL #1   Title independent with intial HEP    Baseline progressed today    Status Achieved      PT SHORT TERM GOAL #2   Title improve Lt shoulder passive ER to 30 deg    Baseline passive ER left shoulder 40 degrees    Status Achieved               PT Long Term Goals - 06/30/21 1344       PT LONG TERM GOAL #1   Title independet with final HEP    Status On-going      PT LONG TERM GOAL #2   Title Lt shoulder active flexion and abduction improved to at least 90 deg in order to perform ADLs    Status Achieved      PT LONG TERM GOAL #3   Title report pain < 3/10 with activity for improved function    Status On-going      PT LONG TERM GOAL #4   Title FOTO score improved to 61 for improved function    Status On-going      PT LONG TERM GOAL #5   Title demonstrate at least 3/5 Lt shoulder flex/abduction strength for improved function    Status On-going  Plan - 07/02/21 1404     Clinical Impression Statement She is progressing a little at a time each week, had notable improvements in Lt  shoulder AROM and PROM but does still have deficits in these areas along with overall shoulder weakness still. It was been difficult to have her perfrom sidelying strengthening or much standing activity due to low back pain. Trialed TDN today to lumbar spine in efforts to reduce overall back pain. She was provided education handout and gave verbal consent to this treatment and had overall good tolerance to this. We will assess her long term response to this next session while main focuse will continue to be on improving her Lt shoulder function.    Personal Factors and Comorbidities Comorbidity 3+    Comorbidities anxiety, breast cancer with lumpectomy and radioactive seed placement 2021 and recent port removal, chronic pain, concussion 2008, HTN, depression, spinal fusion 2012    Examination-Activity Limitations Locomotion Level;Reach Overhead;Dressing;Hygiene/Grooming;Lift;Toileting;Bathing    Examination-Participation Restrictions Cleaning;Meal Prep;Community Activity;Driving;Laundry    Stability/Clinical Decision Making Evolving/Moderate complexity    Rehab Potential Good    PT Frequency 2x / week    PT Duration 12 weeks    PT Treatment/Interventions ADLs/Self Care Home Management;Therapeutic exercise;Patient/family education;Cryotherapy;DME Instruction;Ultrasound;Moist Heat;Functional mobility training;Therapeutic activities;Balance training;Neuromuscular re-education;Manual techniques;Vasopneumatic Device;Taping;Dry needling;Passive range of motion    PT Next Visit Plan progress ROM and strengthening, supine/sidelying exercises to fatigue    PT Home Exercise Plan Access Code: [email or web address], added AROM flexion and abd against gravity in sitting and ER with red band    Consulted and Agree with Plan of Care Patient             Patient will benefit from skilled therapeutic intervention in order to improve the following deficits and impairments:  Postural dysfunction, Decreased range of motion,  Decreased knowledge of precautions, Increased edema, Impaired UE functional use, Decreased strength, Decreased mobility, Decreased balance, Pain  Visit Diagnosis: Acute pain of left shoulder  Stiffness of left shoulder, not elsewhere classified  Abnormal posture  Repeated falls  Localized edema     Problem List Patient Active Problem List   Diagnosis Date Noted   Arthritis of left shoulder region    S/P reverse total shoulder arthroplasty, 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   Vitamin D deficiency 01/15/2021   Vitamin B12 deficiency 01/15/2021   Primary insomnia 01/15/2021   Morbid obesity ([place]) 01/15/2021   Moderate recurrent major depression ([place]) 01/15/2021   Benzodiazepine dependence ([place]) 01/15/2021   Port-A-Cath in place 07/22/2020   Left leg cellulitis 04/17/2020   Cellulitis of left leg 04/17/2020   Hypokalemia 04/17/2020   Macrocytic anemia 04/17/2020   Anxiety 04/17/2020   Depression 04/17/2020   Chronic diarrhea 04/17/2020   Chemotherapy induced diarrhea 04/17/2020   Malignant neoplasm of upper-inner quadrant of right breast in female, estrogen receptor positive ([place]) 12/26/2019   Encephalopathy 02/02/2018   Hypothyroidism 02/02/2018   AKI (acute kidney injury) ([place]) 02/02/2018   Chronic back pain 02/02/2018   Alcohol abuse 02/02/2018   Weight gain 02/02/2018   Benign essential HTN 02/02/2018    Silvestre Mesi 07/02/2021, 2:07 PM  [facility] Physical Therapy [address] [place], [place], [address] Phone: [phone]   Fax:  [phone]  Name: [name] MRN: [id number] Date of Birth: Jul 13, 1951

## 2021-07-05 NOTE — Progress Notes (Signed)
Patient Care Team: Maurice Small, MD as PCP - General (Family Medicine) O'Neal, Cassie Freer, MD as PCP - Cardiology (Cardiology) Rockwell Germany, RN as Oncology Nurse Navigator Mauro Kaufmann, RN as Oncology Nurse Navigator Erroll Luna, MD as Consulting Physician (General Surgery) Nicholas Lose, MD as Consulting Physician (Hematology and Oncology) Gery Pray, MD as Consulting Physician (Radiation Oncology)  DIAGNOSIS:    ICD-10-CM   1. Malignant neoplasm of upper-inner quadrant of right breast in female, estrogen receptor positive ([place])  C50.211    Z17.0       SUMMARY OF ONCOLOGIC HISTORY: Oncology History  Malignant neoplasm of upper-inner quadrant of right breast in female, estrogen receptor positive ([place])  12/26/2019 Initial Diagnosis   Patient palpated a right breast lump x1wk. Mammogram and US showed two adjacent masses at the 2 o'clock position measuring 1.4cm and 0.6cm, calcifications in the outer right breast at the 9 o'clock position, no right axillary adenopathy. Biopsy showed IDC at the 2 o'clock position, grade 3, HER-2 + (3+), ER+ 90%, PR+ 30%, Ki67 30%, and DCIS in the upper outer right breast, high grade, ER+ 95%, PR 90%.   01/22/2020 Surgery   Right breast lumpectomy x2 ([place]): Medial position: IDC, grade 3, 2.3cm, with high grade DCIS, clear margins Lateral position: high grade DCIS, clear margins, 4 right axillary lymph nodes negative    01/22/2020 Cancer Staging   Staging form: Breast, AJCC 8th Edition - Pathologic stage from 01/22/2020: Stage IA (pT2, pN0, cM0, G3, ER+, PR+, HER2+) - Signed by Gardenia Phlegm, NP on 02/06/2020    02/19/2020 -  Chemotherapy    Patient is on Treatment Plan: BREAST WEEKLY PACLITAXEL / TRASTUZUMAB / MAINTENANCE TRASTUZUMAB EVERY 21 DAYS       05/29/2020 - 06/25/2020 Radiation Therapy   Adjuvant radiation     CHIEF COMPLIANT: Follow-up of right breast cancer  INTERVAL HISTORY: [name] is a 70 y.o. with  above-mentioned history of right breast cancer having undergone a lumpectomy, competed adjuvant chemotherapy, radiation, and Herceptin maintenance, currently on antiestrogen therapy with anastrozole. Mammogram on 06/15/21 showed no evidence of malignancy bilaterally. She reports to the clinic today for follow-up.  She has chronic back pain issues and is using a wheelchair today.  She is also developed a recent right knee pain and because of that she is using a wheelchair otherwise she is able to walk without assistance.  Her only complaint with anastrozole is that it causes of profound sweating in her scalp area.  ALLERGIES:  is allergic to [employer] tartrate].  MEDICATIONS:  Current Outpatient Medications  Medication Sig Dispense Refill   amitriptyline (ELAVIL) 25 MG tablet Take 75 mg by mouth at bedtime.      amLODipine (NORVASC) 5 MG tablet Take 7.5 mg by mouth daily in the afternoon.     anastrozole (ARIMIDEX) 1 MG tablet Take 1 tablet (1 mg total) by mouth daily. 90 tablet 3   apixaban (ELIQUIS) 5 MG TABS tablet Take 1 tablet (5 mg total) by mouth 2 (two) times daily. 60 tablet 2   bismuth subsalicylate (PEPTO BISMOL) 262 MG chewable tablet Chew 524 mg by mouth as needed.     celecoxib (CELEBREX) 100 MG capsule Take 1 capsule (100 mg total) by mouth 2 (two) times daily. 60 capsule 0   diazepam (VALIUM) 10 MG tablet TAKE 1 TABLET BY MOUTH AT BEDTIME IF NEEDED FOR SLEEP. 30 tablet 3   furosemide (LASIX) 20 MG tablet Take 20 mg by  mouth daily. For HTN     ibuprofen (ADVIL) 800 MG tablet Take 1 tablet (800 mg total) by mouth every 8 (eight) hours as needed. 30 tablet 0   levothyroxine (SYNTHROID) 150 MCG tablet Take 150 mcg by mouth daily in the afternoon.     lidocaine-prilocaine (EMLA) cream Apply to affected area once (Patient not taking: Reported on 03/26/2021) 30 g 3   methocarbamol (ROBAXIN) 500 MG tablet Take 1 tablet (500 mg total) by mouth every 8 (eight) hours as needed for muscle  spasms. (Patient not taking: Reported on 03/26/2021) 30 tablet 0   metoprolol tartrate (LOPRESSOR) 25 MG tablet Take 1 tablet (25 mg total) by mouth 2 (two) times daily. 180 tablet 3   saccharomyces boulardii (FLORASTOR) 250 MG capsule Take 1 capsule (250 mg total) by mouth 2 (two) times daily. (Patient not taking: Reported on 03/26/2021) 60 capsule 6   traMADol (ULTRAM) 50 MG tablet 1 po q d prn pain 30 tablet 1   venlafaxine XR (EFFEXOR-XR) 150 MG 24 hr capsule Take 150 mg by mouth daily in the afternoon.     vitamin B-12 (CYANOCOBALAMIN) 1000 MCG tablet Take 1,000 mcg by mouth daily in the afternoon.     No current facility-administered medications for this visit.    PHYSICAL EXAMINATION: ECOG PERFORMANCE STATUS: 1 - Symptomatic but completely ambulatory  Vitals:   07/06/21 1423  BP: (!) 151/80  Pulse: 80  Resp: 18  Temp: 98.8 F (37.1 C)  SpO2: 97%   Filed Weights      LABORATORY DATA:  I have reviewed the data as listed CMP Latest Ref Rng & Units 04/28/2021 04/08/2021 01/27/2021  Glucose 70 - 99 mg/dL 87 - 100(H)  BUN 8 - 23 mg/dL 15 - 18  Creatinine 0.44 - 1.00 mg/dL 0.69 0.60 0.81  Sodium 135 - 145 mmol/L 138 - 138  Potassium 3.5 - 5.1 mmol/L 4.4 - 3.9  Chloride 98 - 111 mmol/L 104 - 100  CO2 22 - 32 mmol/L 27 - 26  Calcium 8.9 - 10.3 mg/dL 8.7(L) - 8.8(L)  Total Protein 6.5 - 8.1 g/dL - - -  Total Bilirubin 0.3 - 1.2 mg/dL - - -  Alkaline Phos 38 - 126 U/L - - -  AST 15 - 41 U/L - - -  ALT 0 - 44 U/L - - -    Lab Results  Component Value Date   WBC 9.4 02/04/2021   HGB 10.0 (L) 02/04/2021   HCT 31.0 (L) 02/04/2021   MCV 105.8 (H) 02/04/2021   PLT 219 02/04/2021   NEUTROABS 6.6 01/06/2021    ASSESSMENT & PLAN:  Malignant neoplasm of upper-inner quadrant of right breast in female, estrogen receptor positive ([place]) 01/22/2020: Right medial lumpectomy: Grade 3 IDC 2.3 cm with high-grade DCIS with necrosis, margins negative, negative for lymphovascular or perineural  invasion, Right lateral lumpectomy: Isolated foci of DCIS high-grade, resection margins negative 4 lymph nodes negative, ER 90%, PR 30%, HER-2 3+ positive, Ki-67 30%   Treatment plan: 1.  Adjuvant chemotherapy with Taxol Herceptin weekly x 9 (stopped early because of lower extremity edema) followed by Herceptin maintenance completed 01/27/2021 2.  Adjuvant radiation therapy started 05/29/2020 completed 06/25/2020 3.  Followed by adjuvant antiestrogen therapy  started 06/25/2020 ----------------------------------------------------------------------------------------------------------------------------------------------------------- Current treatment: Anastrozole started 06/25/20  Major depression: Currently on Effexor   08/21/2020: Mildly comminuted and displaced fracture of proximal left humerus: After a fall 10/02/2020: Left hip femoral fracture Adrenal mass and liver abnormalities:  CT CAP 04/09/2021: Unchanged left adrenal mass 2 cm nonspecific.  No suspicious hepatic lesions.  Cholelithiasis, colon diverticulosis   Anastrozole Toxicities: Sweating in the head area  Chronic back pain issues Recent right knee pain requiring wheelchair ambulation.  Breast Cancer Surveillance:  1. Breast Exam: 07/06/21: benign 2. Mammograms: 06/15/21: Benign  RTC in 1 year     No orders of the defined types were placed in this encounter.  The patient has a good understanding of the overall plan. she agrees with it. she will call with any problems that may develop before the next visit here.  Total time spent: 20 mins including face to face time and time spent for planning, charting and coordination of care  Rulon Eisenmenger, MD, MPH 07/06/2021  I, Thana Ates, am acting as scribe for Dr. Nicholas Lose.  I have reviewed the above documentation for accuracy and completeness, and I agree with the above.

## 2021-07-06 ENCOUNTER — Inpatient Hospital Stay: Payer: Medicare PPO | Attending: Hematology and Oncology | Admitting: Hematology and Oncology

## 2021-07-06 ENCOUNTER — Other Ambulatory Visit: Payer: Self-pay

## 2021-07-06 DIAGNOSIS — Z993 Dependence on wheelchair: Secondary | ICD-10-CM | POA: Diagnosis not present

## 2021-07-06 DIAGNOSIS — M549 Dorsalgia, unspecified: Secondary | ICD-10-CM | POA: Diagnosis not present

## 2021-07-06 DIAGNOSIS — Z923 Personal history of irradiation: Secondary | ICD-10-CM | POA: Diagnosis not present

## 2021-07-06 DIAGNOSIS — Z170 Estrogen receptor positive status [ER+]: Secondary | ICD-10-CM | POA: Diagnosis not present

## 2021-07-06 DIAGNOSIS — M25561 Pain in right knee: Secondary | ICD-10-CM | POA: Insufficient documentation

## 2021-07-06 DIAGNOSIS — G8929 Other chronic pain: Secondary | ICD-10-CM | POA: Diagnosis not present

## 2021-07-06 DIAGNOSIS — Z9221 Personal history of antineoplastic chemotherapy: Secondary | ICD-10-CM | POA: Insufficient documentation

## 2021-07-06 DIAGNOSIS — C50211 Malignant neoplasm of upper-inner quadrant of right female breast: Secondary | ICD-10-CM | POA: Diagnosis not present

## 2021-07-06 NOTE — Assessment & Plan Note (Signed)
01/22/2020: Right medial lumpectomy: Grade 3 IDC 2.3 cm with high-grade DCIS with necrosis, margins negative, negative for lymphovascular or perineural invasion, Right lateral lumpectomy: Isolated foci of DCIS high-grade, resection margins negative 4 lymph nodes negative, ER 90%, PR 30%, HER-2 3+ positive, Ki-67 30%  Treatment plan: 1.Adjuvant chemotherapy with Taxol Herceptin weekly x9(stopped early because of lower extremity edema)followed by Herceptin maintenance completed 01/27/2021 2.Adjuvant radiation therapystarted 6/10/2021completed 06/25/2020 3.Followed by adjuvant antiestrogen therapy started 06/25/2020 ----------------------------------------------------------------------------------------------------------------------------------------------------------- Current treatment:Anastrozole started 06/25/20  Major depression: Currently on Prozac. Pain issues:Currently onPercocets 2.5 mg.  08/21/2020:Mildly comminuted and displaced fracture of proximal left humerus: Afterafall 10/02/2020: Left hip femoral fracture  Leg pain:Currently onPercocets.Palliative care following Hypokalemia:On oral potassium daily. Insomnia: I renewed her Valium prescription today.  Adrenal mass and liver abnormalities:  CT CAP 04/09/2021: Unchanged left adrenal mass 2 cm nonspecific.  No suspicious hepatic lesions.  Cholelithiasis, colon diverticulosis  Anastrozole Toxicities:  Breast Cancer Surveillance:  1. Breast Exam: 07/06/21: benign 2. Mammograms: 06/15/21: Benign  RTC in 1 year

## 2021-07-07 ENCOUNTER — Ambulatory Visit (INDEPENDENT_AMBULATORY_CARE_PROVIDER_SITE_OTHER): Payer: Medicare PPO | Admitting: Physical Therapy

## 2021-07-07 ENCOUNTER — Encounter: Payer: Self-pay | Admitting: Physical Therapy

## 2021-07-07 DIAGNOSIS — R293 Abnormal posture: Secondary | ICD-10-CM

## 2021-07-07 DIAGNOSIS — R296 Repeated falls: Secondary | ICD-10-CM | POA: Diagnosis not present

## 2021-07-07 DIAGNOSIS — M25512 Pain in left shoulder: Secondary | ICD-10-CM

## 2021-07-07 DIAGNOSIS — M25612 Stiffness of left shoulder, not elsewhere classified: Secondary | ICD-10-CM

## 2021-07-07 DIAGNOSIS — R600 Localized edema: Secondary | ICD-10-CM | POA: Diagnosis not present

## 2021-07-07 NOTE — Therapy (Signed)
[facility] [address] [place], [place], [address] Phone: [phone]   Fax:  [phone]  Physical Therapy Treatment  Patient Details  Name: [name] MRN: [id number] Date of Birth: 17-Oct-1951 Referring Provider (PT): Donella Stade, [place]   Encounter Date: 07/07/2021   PT End of Session - 07/07/21 1403     Visit Number 19    Number of Visits 24    Date for PT Re-Evaluation 07/16/21    Authorization Type [employer]    Authorization - Visit Number 7    Authorization - Number of Visits 12    Progress Note Due on Visit 20    PT Start Time 1791    PT Stop Time 1425    PT Time Calculation (min) 40 min    Activity Tolerance Patient tolerated treatment well    Behavior During Therapy [facility] for tasks assessed/performed             Past Medical History:  Diagnosis Date   Anxiety    Atrial fibrillation [facility])    sees Dr. Audie Box   Cancer [facility])    breast   Chronic pain    Concussion 02/2007   ICU x 3 days   Depression    Hypertension    Hypothyroidism    Personal history of chemotherapy    Personal history of radiation therapy    Spinal stenosis    Thyroid disease ?1994    Past Surgical History:  Procedure Laterality Date   BREAST BIOPSY Right 12/20/2019   x2   BREAST LUMPECTOMY Right 01/22/2020   BREAST LUMPECTOMY WITH RADIOACTIVE SEED AND SENTINEL LYMPH NODE BIOPSY Right 01/22/2020   Procedure: RIGHT BREAST LUMPECTOMY WITH RADIOACTIVE SEED X2 AND RIGHT SENTINEL LYMPH NODE MAPPING;  Surgeon: Erroll Luna, MD;  Location: [place];  Service: General;  Laterality: Right;   BREAST SURGERY Right 03/1998   breast biopsy, benign   EYE SURGERY Right    PORT-A-CATH REMOVAL Right 04/30/2021   Procedure: REMOVAL PORT-A-CATH;  Surgeon: Erroll Luna, MD;  Location: [place];  Service: General;  Laterality: Right;   PORTACATH PLACEMENT Right 01/22/2020   Procedure: INSERTION PORT-A-CATH WITH  ULTRASOUND;  Surgeon: Erroll Luna, MD;  Location: [place];  Service: General;  Laterality: Right;   PORTACATH PLACEMENT Right 02/28/2020   Procedure: PORT A CATH REVISION;  Surgeon: Erroll Luna, MD;  Location: [place];  Service: General;  Laterality: Right;   REVERSE SHOULDER ARTHROPLASTY Left 02/02/2021   Procedure: LEFT REVERSE SHOULDER ARTHROPLASTY;  Surgeon: Meredith Pel, MD;  Location: [place];  Service: Orthopedics;  Laterality: Left;   SHOULDER SURGERY Right    SPINAL FUSION  03/04/2011   with ORIF    There were no vitals filed for this visit.   Subjective Assessment - 07/07/21 1359     Subjective Pt arriving today reporting 2/10 pain in her left shoulder. Pt reporting 10/10 pain in right knee with weight bearing. Pt having difficulty walking due to knee pain. Pt can't recall incident where she may have injured her knee.    Pertinent History anxiety, breast cancer with lumpectomy and radioactive seed placement 2021 and recent port removal, chronic pain, concussion 2008, HTN, depression, spinal fusion 2012.    Patient Stated Goals improve function of Lt arm, raise shoulder over her head to do her hair    Currently in Pain? Yes    Pain Location Shoulder    Pain Orientation Left  Pain Descriptors / Indicators Sore    Pain Type Surgical pain    Pain Onset More than a month ago    Pain Frequency Constant    Multiple Pain Sites Yes    Pain Score 10    Pain Location Knee    Pain Orientation Right    Pain Descriptors / Indicators Sharp    Pain Type Acute pain    Pain Onset In the past 7 days    Pain Frequency Constant    Aggravating Factors  weight bearing    Pain Relieving Factors resting, over the counter pain meds                               [facility] Adult PT Treatment/Exercise - 07/07/21 0001       Shoulder Exercises: Supine   External Rotation Strengthening;Left;15 reps    Flexion Left;15 reps    Shoulder Flexion Weight  (lbs) 2    Flexion Limitations also did D1 and D2 felxion X 10 ea 2#    ABduction Strengthening;Left;15 reps    Other Supine Exercises 30 second shoulder circles in 90 degrees flexion using 2# weight in each direction      Modalities   Modalities Cryotherapy      Moist Heat Therapy   Number Minutes Moist Heat 10 Minutes   placed on  right knee while pt performing supine shoulder exercises today.   Moist Heat Location Shoulder      Manual Therapy   Manual therapy comments PROM left shoulder to tolerance all planes, STM with compression to left lower lumbar L4-5                      PT Short Term Goals - 07/07/21 1420       PT SHORT TERM GOAL #1   Title independent with intial HEP    Status Achieved      PT SHORT TERM GOAL #2   Title improve Lt shoulder passive ER to 30 deg    Status Achieved               PT Long Term Goals - 07/07/21 1421       PT LONG TERM GOAL #1   Title independet with final HEP    Status On-going      PT LONG TERM GOAL #2   Title Lt shoulder active flexion and abduction improved to at least 90 deg in order to perform ADLs    Status Achieved      PT LONG TERM GOAL #3   Title report pain < 3/10 with activity for improved function    Status On-going      PT LONG TERM GOAL #4   Title FOTO score improved to 61 for improved function    Status On-going      PT LONG TERM GOAL #5   Title demonstrate at least 3/5 Lt shoulder flex/abduction strength for improved function    Status On-going                   Plan - 07/07/21 1404     Clinical Impression Statement Pt arriving today barely walking and able to get out of the chair in the waiting area of clinic. Pt reporting 10/10 pain in her right knee which began about 3 days ago. Pt stating her low back is feeling much better following her last treatment.  Pt  reporting sharp pain with tenderness along medial joint line. Pt reporting her left sholder pain is 2/10 today.  Treatment performed today in supine due to inability to tolerate weight bearing in right knee. Ice pack applied to right knee during shoulder exercises.  Pt using a quad cane for ambulation. Pt was adised to follow up with her physician about her new onset of right knee pain. Overall her shoulder is progressing nicely toward goals set.    Personal Factors and Comorbidities Comorbidity 3+    Comorbidities anxiety, breast cancer with lumpectomy and radioactive seed placement 2021 and recent port removal, chronic pain, concussion 2008, HTN, depression, spinal fusion 2012    Examination-Activity Limitations Locomotion Level;Reach Overhead;Dressing;Hygiene/Grooming;Lift;Toileting;Bathing    Examination-Participation Restrictions Cleaning;Meal Prep;Community Activity;Driving;Laundry    Stability/Clinical Decision Making Evolving/Moderate complexity    Rehab Potential Good    PT Frequency 2x / week    PT Duration 12 weeks    PT Treatment/Interventions ADLs/Self Care Home Management;Therapeutic exercise;Patient/family education;Cryotherapy;DME Instruction;Ultrasound;Moist Heat;Functional mobility training;Therapeutic activities;Balance training;Neuromuscular re-education;Manual techniques;Vasopneumatic Device;Taping;Dry needling;Passive range of motion    PT Next Visit Plan ask how right knee is feeling and if pt was able to f/u with MD, progress ROM and strengthening    PT Home Exercise Plan Access Code: [email or web address], added AROM flexion and abd against gravity in sitting and ER with red band    Consulted and Agree with Plan of Care Patient             Patient will benefit from skilled therapeutic intervention in order to improve the following deficits and impairments:  Postural dysfunction, Decreased range of motion, Decreased knowledge of precautions, Increased edema, Impaired UE functional use, Decreased strength, Decreased mobility, Decreased balance, Pain  Visit Diagnosis: Acute pain of left  shoulder  Stiffness of left shoulder, not elsewhere classified  Abnormal posture  Repeated falls  Localized edema     Problem List Patient Active Problem List   Diagnosis Date Noted   Arthritis of left shoulder region    S/P reverse total shoulder arthroplasty, 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   Vitamin D deficiency 01/15/2021   Vitamin B12 deficiency 01/15/2021   Primary insomnia 01/15/2021   Morbid obesity ([place]) 01/15/2021   Moderate recurrent major depression ([place]) 01/15/2021   Benzodiazepine dependence ([place]) 01/15/2021   Port-A-Cath in place 07/22/2020   Left leg cellulitis 04/17/2020   Cellulitis of left leg 04/17/2020   Hypokalemia 04/17/2020   Macrocytic anemia 04/17/2020   Anxiety 04/17/2020   Depression 04/17/2020   Chronic diarrhea 04/17/2020   Chemotherapy induced diarrhea 04/17/2020   Malignant neoplasm of upper-inner quadrant of right breast in female, estrogen receptor positive ([place]) 12/26/2019   Encephalopathy 02/02/2018   Hypothyroidism 02/02/2018   AKI (acute kidney injury) ([place]) 02/02/2018   Chronic back pain 02/02/2018   Alcohol abuse 02/02/2018   Weight gain 02/02/2018   Benign essential HTN 02/02/2018    Oretha Caprice, PT, MPT 07/07/2021, 2:36 PM  [facility] [address] [place], [place], [address] Phone: [phone]   Fax:  [phone]  Name: [name] MRN: [id number] Date of Birth: Jan 21, 1951

## 2021-07-10 ENCOUNTER — Other Ambulatory Visit: Payer: Self-pay

## 2021-07-10 ENCOUNTER — Ambulatory Visit (INDEPENDENT_AMBULATORY_CARE_PROVIDER_SITE_OTHER): Payer: Medicare PPO | Admitting: Physician Assistant

## 2021-07-10 ENCOUNTER — Ambulatory Visit (INDEPENDENT_AMBULATORY_CARE_PROVIDER_SITE_OTHER): Payer: Medicare PPO

## 2021-07-10 ENCOUNTER — Encounter: Payer: Medicare PPO | Admitting: Physical Therapy

## 2021-07-10 DIAGNOSIS — M25561 Pain in right knee: Secondary | ICD-10-CM | POA: Diagnosis not present

## 2021-07-10 MED ORDER — METHYLPREDNISOLONE ACETATE 40 MG/ML IJ SUSP
40.0000 mg | INTRAMUSCULAR | Status: AC | PRN
  Administered 2021-07-10: 40 mg via INTRA_ARTICULAR

## 2021-07-10 MED ORDER — LIDOCAINE HCL 1 % IJ SOLN
5.0000 mL | INTRAMUSCULAR | Status: AC | PRN
  Administered 2021-07-10: 5 mL

## 2021-07-10 NOTE — Progress Notes (Signed)
Office Visit Note   Patient: [name]           Date of Birth: Feb 28, 1951           MRN: [id number] Visit Date: 07/10/2021              Requested by: Maurice Small, MD [place],  [place] [address] PCP: Maurice Small, MD  Chief Complaint  Patient presents with  . Right Knee - Pain      HPI: Patient presents today with a few day history of right medial knee pain.  She is feels very overwhelmed as she just completed chemotherapy and was told that she was in remission.  She normally sees Dr. Marlou Sa.  She denies any history of previous injury to her knee.  She points to the medial joint line as a source of her pain.  She denies any fever or chills.  She denies any injury.  Assessment & Plan: Visit Diagnoses:  1. Acute pain of right knee     Plan: Do not have any findings suggestive for an infective process.  I talked her about going forward with a steroid injection today.  She would like to do this.  We will follow-up with Dr. Marlou Sa in 1 week.   Follow-Up Instructions: No follow-ups on file.   Ortho Exam  Patient is alert, oriented, no adenopathy, well-dressed, normal affect, normal respiratory effort. Examination of her right knee she has no redness no warmth no effusion.  She has Paula tender over the medial joint line.  No tenderness over the lateral joint line.  She has good patellar tracking.  Compartments are soft and nontender.  Good varus and valgus stress.  Good endpoint on anterior draw.  No effusion no signs of infection  Imaging: No results found. No images are attached to the encounter.  Labs: Lab Results  Component Value Date   REPTSTATUS 04/22/2020 FINAL 04/17/2020   REPTSTATUS 04/22/2020 FINAL 04/17/2020   CULT  04/17/2020    NO GROWTH 5 DAYS Performed at [facility], [place] [address] [place], [place] [address]    CULT  04/17/2020    NO GROWTH 5 DAYS Performed at [facility] [address], [place], [place]  [id number]      Lab Results  Component Value Date   ALBUMIN 3.6 01/06/2021   ALBUMIN 3.6 12/12/2020   ALBUMIN 3.3 (L) 11/25/2020    Lab Results  Component Value Date   MG 1.7 04/17/2020   MG 2.0 07/11/2017   Lab Results  Component Value Date   VD25OH 20 (L) 12/24/2020   VD25OH 27 (L) 11/13/2013    No results found for: PREALBUMIN CBC EXTENDED Latest Ref Rng & Units 02/04/2021 02/03/2021 01/27/2021  WBC 4.0 - 10.5 K/uL 9.4 10.8(H) 8.5  RBC 3.87 - 5.11 MIL/uL 2.93(L) 3.25(L) 4.01  HGB 12.0 - 15.0 g/dL 10.0(L) 10.6(L) 13.5  HCT 36.0 - 46.0 % 31.0(L) 33.3(L) 39.8  PLT 150 - 400 K/uL 219 231 261  NEUTROABS 1.7 - 7.7 K/uL - - -  LYMPHSABS 0.7 - 4.0 K/uL - - -     There is no height or weight on file to calculate BMI.  Orders:  Orders Placed This Encounter  Procedures  . XR Knee 1-2 Views Right   No orders of the defined types were placed in this encounter.    Procedures: Large Joint Inj: R knee on 07/10/2021 4:11 PM Indications: pain and  diagnostic evaluation Details: 22 G 1.5 in needle, anteromedial approach  Arthrogram: No  Medications: 40 mg methylPREDNISolone acetate 40 MG/ML; 5 mL lidocaine 1 % Outcome: tolerated well, no immediate complications Procedure, treatment alternatives, risks and benefits explained, specific risks discussed. Consent was given by the patient.     Clinical Data: No additional findings.  ROS:  All other systems negative, except as noted in the HPI. Review of Systems  Objective: Vital Signs: LMP  (LMP Unknown)   Specialty Comments:  No specialty comments available.  PMFS History: Patient Active Problem List   Diagnosis Date Noted  . Arthritis of left shoulder region   . S/P reverse total shoulder arthroplasty, 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  . Vitamin D deficiency 01/15/2021  . Vitamin B12 deficiency 01/15/2021  . Primary insomnia 01/15/2021  . Morbid obesity ([place]) 01/15/2021  . Moderate recurrent major depression ([place]) 01/15/2021  .  Benzodiazepine dependence ([place]) 01/15/2021  . Port-A-Cath in place 07/22/2020  . Left leg cellulitis 04/17/2020  . Cellulitis of left leg 04/17/2020  . Hypokalemia 04/17/2020  . Macrocytic anemia 04/17/2020  . Anxiety 04/17/2020  . Depression 04/17/2020  . Chronic diarrhea 04/17/2020  . Chemotherapy induced diarrhea 04/17/2020  . Malignant neoplasm of upper-inner quadrant of right breast in female, estrogen receptor positive ([place]) 12/26/2019  . Encephalopathy 02/02/2018  . Hypothyroidism 02/02/2018  . AKI (acute kidney injury) ([place]) 02/02/2018  . Chronic back pain 02/02/2018  . Alcohol abuse 02/02/2018  . Weight gain 02/02/2018  . Benign essential HTN 02/02/2018   Past Medical History:  Diagnosis Date  . Anxiety   . Atrial fibrillation [facility])    sees Dr. Audie Box  . Cancer [facility])    breast  . Chronic pain   . Concussion 02/2007   ICU x 3 days  . Depression   . Hypertension   . Hypothyroidism   . Personal history of chemotherapy   . Personal history of radiation therapy   . Spinal stenosis   . Thyroid disease ?1994    Family History  Problem Relation Age of Onset  . Thyroid disease Mother   . Dementia Mother   . Diabetes Father   . Stroke Father   . Hypertension Sister   . Diabetes Sister     Past Surgical History:  Procedure Laterality Date  . BREAST BIOPSY Right 12/20/2019   x2  . BREAST LUMPECTOMY Right 01/22/2020  . BREAST LUMPECTOMY WITH RADIOACTIVE SEED AND SENTINEL LYMPH NODE BIOPSY Right 01/22/2020   Procedure: RIGHT BREAST LUMPECTOMY WITH RADIOACTIVE SEED X2 AND RIGHT SENTINEL LYMPH NODE MAPPING;  Surgeon: Erroll Luna, MD;  Location: [place];  Service: General;  Laterality: Right;  . BREAST SURGERY Right 03/1998   breast biopsy, benign  . EYE SURGERY Right   . PORT-A-CATH REMOVAL Right 04/30/2021   Procedure: REMOVAL PORT-A-CATH;  Surgeon: Erroll Luna, MD;  Location: [place];  Service: General;  Laterality:  Right;  . PORTACATH PLACEMENT Right 01/22/2020   Procedure: INSERTION PORT-A-CATH WITH ULTRASOUND;  Surgeon: Erroll Luna, MD;  Location: [place];  Service: General;  Laterality: Right;  . PORTACATH PLACEMENT Right 02/28/2020   Procedure: PORT A CATH REVISION;  Surgeon: Erroll Luna, MD;  Location: [place];  Service: General;  Laterality: Right;  . REVERSE SHOULDER ARTHROPLASTY Left 02/02/2021   Procedure: LEFT REVERSE SHOULDER ARTHROPLASTY;  Surgeon: Meredith Pel, MD;  Location: [place];  Service: Orthopedics;  Laterality: Left;  . SHOULDER SURGERY Right   . SPINAL FUSION  03/04/2011   with ORIF   Social History   Occupational History  . 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 Smoking status: Former    Years: 20.00    Types: Cigarettes  . Smokeless tobacco: Never  Vaping Use  . Vaping Use: Never used  Substance and Sexual Activity  . Alcohol use: Yes    Alcohol/week: 5.0 standard drinks    Types: 5 Standard drinks or equivalent per week    Comment: wine  . Drug use: No  . Sexual activity: Not Currently    Partners: Male    Birth control/protection: Post-menopausal

## 2021-07-13 ENCOUNTER — Ambulatory Visit: Payer: Medicare PPO | Admitting: Hematology and Oncology

## 2021-07-14 ENCOUNTER — Other Ambulatory Visit: Payer: Self-pay

## 2021-07-14 ENCOUNTER — Ambulatory Visit (INDEPENDENT_AMBULATORY_CARE_PROVIDER_SITE_OTHER): Payer: Medicare PPO | Admitting: Physical Therapy

## 2021-07-14 ENCOUNTER — Encounter: Payer: Self-pay | Admitting: Physical Therapy

## 2021-07-14 DIAGNOSIS — R262 Difficulty in walking, not elsewhere classified: Secondary | ICD-10-CM

## 2021-07-14 DIAGNOSIS — M6281 Muscle weakness (generalized): Secondary | ICD-10-CM | POA: Diagnosis not present

## 2021-07-14 DIAGNOSIS — M25612 Stiffness of left shoulder, not elsewhere classified: Secondary | ICD-10-CM

## 2021-07-14 DIAGNOSIS — M25561 Pain in right knee: Secondary | ICD-10-CM

## 2021-07-14 DIAGNOSIS — R296 Repeated falls: Secondary | ICD-10-CM | POA: Diagnosis not present

## 2021-07-14 DIAGNOSIS — R293 Abnormal posture: Secondary | ICD-10-CM | POA: Diagnosis not present

## 2021-07-14 DIAGNOSIS — R600 Localized edema: Secondary | ICD-10-CM | POA: Diagnosis not present

## 2021-07-14 DIAGNOSIS — M25512 Pain in left shoulder: Secondary | ICD-10-CM | POA: Diagnosis not present

## 2021-07-14 NOTE — Therapy (Signed)
[facility] [address] [place], [place], [address] Phone: [phone]   Fax:  [phone]  Physical Therapy Treatment Progress Note/Re-certification  Patient Details  Name: [name] MRN: [id number] Date of Birth: [birth year] Referring Provider (PT): Magnant, Gerrianne Scale, PA-C  Progress Note Reporting Period 06/02/2021 to 08/28/2021  See note below for Objective Data and Assessment of Progress/Goals.      Encounter Date: 07/14/2021   PT End of Session - 07/14/21 1621     Visit Number 20    Number of Visits 32    Date for PT Re-Evaluation 08/28/21    Authorization Type [employer]    Authorization Time Period 07/16/2021 to 08/21/2021 requesting 12 more visits on 07/14/2021.    Authorization - Visit Number 8    Authorization - Number of Visits 12    Progress Note Due on Visit 30    PT Start Time 4163    PT Stop Time 1430    PT Time Calculation (min) 45 min    Activity Tolerance Patient tolerated treatment well    Behavior During Therapy WFL for tasks assessed/performed             Past Medical History:  Diagnosis Date   Anxiety    Atrial fibrillation [facility])    sees Dr. Audie Box   Cancer [facility])    breast   Chronic pain    Concussion 02/2007   ICU x 3 days   Depression    Hypertension    Hypothyroidism    Personal history of chemotherapy    Personal history of radiation therapy    Spinal stenosis    Thyroid disease ?1994    Past Surgical History:  Procedure Laterality Date   BREAST BIOPSY Right 12/20/2019   x2   BREAST LUMPECTOMY Right 01/22/2020   BREAST LUMPECTOMY WITH RADIOACTIVE SEED AND SENTINEL LYMPH NODE BIOPSY Right 01/22/2020   Procedure: RIGHT BREAST LUMPECTOMY WITH RADIOACTIVE SEED X2 AND RIGHT SENTINEL LYMPH NODE MAPPING;  Surgeon: Erroll Luna, MD;  Location: [place];  Service: General;  Laterality: Right;   BREAST SURGERY Right 03/1998   breast biopsy, benign   EYE SURGERY Right     PORT-A-CATH REMOVAL Right 04/30/2021   Procedure: REMOVAL PORT-A-CATH;  Surgeon: Erroll Luna, MD;  Location: [place];  Service: General;  Laterality: Right;   PORTACATH PLACEMENT Right 01/22/2020   Procedure: INSERTION PORT-A-CATH WITH ULTRASOUND;  Surgeon: Erroll Luna, MD;  Location: [place];  Service: General;  Laterality: Right;   PORTACATH PLACEMENT Right 02/28/2020   Procedure: PORT A CATH REVISION;  Surgeon: Erroll Luna, MD;  Location: [place];  Service: General;  Laterality: Right;   REVERSE SHOULDER ARTHROPLASTY Left 02/02/2021   Procedure: LEFT REVERSE SHOULDER ARTHROPLASTY;  Surgeon: Meredith Pel, MD;  Location: [place];  Service: Orthopedics;  Laterality: Left;   SHOULDER SURGERY Right    SPINAL FUSION  03/04/2011   with ORIF    There were no vitals filed for this visit.   Subjective Assessment - 07/14/21 1614     Subjective Pt arrivinvg today reporting 8-9/10 right knee pain. Pt arriving today in transport chair due to weight bearing increasing her pain. Pt stating she has not been working on her shoulder exercises due to increased knee pain. Pt still can't recall mechanism of injury. Pt stating the X-ray didn't reveal anything that would be causing his sudden onset of pain. Pt stating she has an apointment with Dr. Marlou Sa on  Monday.    Pertinent History anxiety, breast cancer with lumpectomy and radioactive seed placement 2021 and recent port removal, chronic pain, concussion 2008, HTN, depression, spinal fusion 2012.    Patient Stated Goals improve function of Lt arm, raise shoulder over her head to do her hair    Currently in Pain? Yes    Pain Score 9     Pain Location Knee    Pain Orientation Right;Medial    Pain Descriptors / Indicators Sore;Aching;Constant    Pain Type Acute pain    Pain Onset 1 to 4 weeks ago    Pain Frequency Constant    Aggravating Factors  weight bearing    Pain Relieving Factors sitting, ice     Multiple Pain Sites Yes    Pain Location Shoulder    Pain Orientation Left    Pain Descriptors / Indicators Aching    Pain Type Chronic pain    Pain Onset More than a month ago    Pain Frequency Intermittent    Aggravating Factors  reaching, rotational movments    Pain Relieving Factors resting                [facility] PT Assessment - 07/14/21 0001       Assessment   Medical Diagnosis Z96.612 (ICD-10-CM) - History of arthroplasty of left shoulder    Referring Provider (PT) Magnant, Gerrianne Scale, PA-C    Onset Date/Surgical Date 02/02/21      AROM   Overall AROM Comments supine    Left Shoulder Flexion 144 Degrees    Left Shoulder ABduction 112 Degrees    Left Shoulder External Rotation 45 Degrees      PROM   Overall PROM Comments supine    Left Shoulder Flexion 160 Degrees    Left Shoulder ABduction 158 Degrees    Left Shoulder External Rotation 68 Degrees      Strength   Strength Assessment Site Knee    Right/Left Knee Right;Left    Right Knee Flexion 3/5    Right Knee Extension 3/5    Left Knee Flexion 5/5    Left Knee Extension 5/5      Palpation   Palpation comment TTP: medial joint line of right knee      Special Tests   Other special tests attempted medial/lateral drawer but pt in too much pain to tolerate testing      Transfers   Comments pt requiring moderate assistance for sit to stand this visit                           [facility] Adult PT Treatment/Exercise - 07/14/21 0001       Manual Therapy   Manual therapy comments PROM left shoulder to tolerance all planes, STM with compression to Rt medial knee              Trigger Point Dry Needling - 07/14/21 0001     Consent Given? Yes    Education Handout Provided Previously provided    Muscles Treated Lower Quadrant Vastus medialis;Gastrocnemius;Adductor longus/brevis/magnus   Gracillis   Adductor Response Twitch response elicited    Vastus medialis Response Twitch response elicited     Gastrocnemius Response Twitch response elicited                    PT Short Term Goals - 07/07/21 1420       PT SHORT TERM GOAL #1   Title  independent with intial HEP    Status Achieved      PT SHORT TERM GOAL #2   Title improve Lt shoulder passive ER to 30 deg    Status Achieved               PT Long Term Goals - 07/14/21 1633       PT LONG TERM GOAL #1   Title independent with final HEP    Status On-going      PT LONG TERM GOAL #2   Title Lt shoulder active flexion and abduction improved to at least 90 deg in order to perform ADLs    Baseline met    Status Achieved      PT LONG TERM GOAL #3   Title report pain < 3/10 with activity for improved function    Status On-going      PT LONG TERM GOAL #4   Title FOTO score improved to 61 for improved function    Status On-going      PT LONG TERM GOAL #5   Title demonstrate at least 3/5 Lt shoulder flex/abduction strength for improved function    Status On-going      Additional Long Term Goals   Additional Long Term Goals Yes      PT LONG TERM GOAL #6   Title Pt will be able to perform sit to stand with UE support independently with pain </= 3/10.    Time 6    Period Weeks    Status New    Target Date 08/28/21      PT LONG TERM GOAL #7   Title Pt will improve her right knee strength to >/=4/5.    Time 6    Period Weeks    Status New    Target Date 08/28/21                   Plan - 07/14/21 1623     Clinical Impression Statement Pt arriving today reporting 8-9/10 right knee pain. Pt reporting her pain is so bad that she arrived to therapy in transport chair and required moderate assistance for transfers. Pt presenting with right medial knee pain with tenderness to touch. Pt with limited ROM due to pain. Weakness of 3/5 in right knee flexion and extension. All shoulder exercises performed in sitting and in supine today due to pain. Ice pack placed on right knee while working on PROM of  left shoulder. DN performed by Elsie Ra, PT, DPT.  I am requesting 2x/ week for 6 additional weeks to focus on both shoulder and knee.    Personal Factors and Comorbidities Comorbidity 3+    Comorbidities anxiety, breast cancer with lumpectomy and radioactive seed placement 2021 and recent port removal, chronic pain, concussion 2008, HTN, depression, spinal fusion 2012    Examination-Activity Limitations Locomotion Level;Reach Overhead;Dressing;Hygiene/Grooming;Lift;Toileting;Bathing    Examination-Participation Restrictions Cleaning;Meal Prep;Community Activity;Driving;Laundry    Stability/Clinical Decision Making Evolving/Moderate complexity    Clinical Decision Making Moderate    Rehab Potential Fair    PT Frequency 2x / week    PT Duration 6 weeks    PT Treatment/Interventions ADLs/Self Care Home Management;Therapeutic exercise;Patient/family education;Cryotherapy;DME Instruction;Ultrasound;Moist Heat;Functional mobility training;Therapeutic activities;Balance training;Neuromuscular re-education;Manual techniques;Vasopneumatic Device;Taping;Dry needling;Passive range of motion;Electrical Stimulation    PT Next Visit Plan Refer to Dr. Randel Pigg evaluation from 07/20/2021. F/u with how DN went in right knee, continue to progress shoulder ROM/strengthening    PT Home Exercise Plan Access Code: [email or web address], added AROM flexion  and abd against gravity in sitting and ER with red band    Consulted and Agree with Plan of Care Patient             Patient will benefit from skilled therapeutic intervention in order to improve the following deficits and impairments:  Postural dysfunction, Decreased range of motion, Decreased knowledge of precautions, Increased edema, Impaired UE functional use, Decreased strength, Decreased mobility, Decreased balance, Pain, Difficulty walking, Decreased activity tolerance  Visit Diagnosis: Acute pain of left shoulder  Stiffness of left shoulder, not elsewhere  classified  Abnormal posture  Repeated falls  Localized edema  Muscle weakness (generalized)  Difficulty in walking, not elsewhere classified     Problem List Patient Active Problem List   Diagnosis Date Noted   Arthritis of left shoulder region    S/P reverse total shoulder arthroplasty, 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   Vitamin D deficiency 01/15/2021   Vitamin B12 deficiency 01/15/2021   Primary insomnia 01/15/2021   Morbid obesity ([place]) 01/15/2021   Moderate recurrent major depression ([place]) 01/15/2021   Benzodiazepine dependence ([place]) 01/15/2021   Port-A-Cath in place 07/22/2020   Left leg cellulitis 04/17/2020   Cellulitis of left leg 04/17/2020   Hypokalemia 04/17/2020   Macrocytic anemia 04/17/2020   Anxiety 04/17/2020   Depression 04/17/2020   Chronic diarrhea 04/17/2020   Chemotherapy induced diarrhea 04/17/2020   Malignant neoplasm of upper-inner quadrant of right breast in female, estrogen receptor positive ([place]) 12/26/2019   Encephalopathy 02/02/2018   Hypothyroidism 02/02/2018   AKI (acute kidney injury) ([place]) 02/02/2018   Chronic back pain 02/02/2018   Alcohol abuse 02/02/2018   Weight gain 02/02/2018   Benign essential HTN 02/02/2018  Referring diagnosis? O12.248, M25.512 Treatment diagnosis? (if different than referring diagnosis) M25.512, M25.612, R29.3, R29.6, R60.0, M62.81, R26.2, M25.561 What was this (referring dx) caused by? '[]'  Surgery '[]'  Fall '[x]'  Ongoing issue '[]'  Arthritis '[]'  Other: ____________  Laterality: '[x]'  Rt knee '[x]'  Lt shoulder '[]'  Both  Check all possible CPT codes:      '[x]'  97110 (Therapeutic Exercise)  '[]'  92507 (SLP Treatment)  '[x]'  97112 (Neuro Re-ed)   '[]'  92526 (Swallowing Treatment)   '[x]'  97116 (Gait Training)   '[]'  [id number] (Cognitive Training, 1st 15 minutes) '[x]'  97140 (Manual Therapy)   '[]'  97130 (Cognitive Training, each add'l 15 minutes)  '[x]'  97530 (Therapeutic Activities)  '[]'  Other, List CPT Code ____________    '[x]'  25003 (Self  Care)       '[]'  All codes above (97110 - 97535)  '[]'  97012 (Mechanical Traction)  '[x]'  97014 (E-stim Unattended)  '[]'  70488 (E-stim manual)  '[x]'  97033 (Ionto)  '[]'  97035 (Ultrasound)  '[]'  97760 (Orthotic Fit) '[]'  [id number] (Physical Performance Training) '[]'  [id number] (Aquatic Therapy) '[]'  97034 (Contrast Bath) '[]'  [id number] (Paraffin) '[]'  97597 (Wound Care 1st 20 sq cm) '[]'  97598 (Wound Care each add'l 20 sq cm) '[x]'  97016 (Vasopneumatic Device) '[]'  [id number] (Orthotic Training) '[]'  [id number] (Prosthetic Training)   Oretha Caprice, PT, MPT 07/14/2021, 4:36 PM  [facility] Therapy [address] [place], [place], [address] Phone: [phone]   Fax:  [phone]  Name: [name] MRN: [id number] Date of Birth: 1951/10/02

## 2021-07-16 ENCOUNTER — Encounter: Payer: Self-pay | Admitting: Physical Therapy

## 2021-07-16 ENCOUNTER — Other Ambulatory Visit: Payer: Self-pay

## 2021-07-16 ENCOUNTER — Ambulatory Visit (INDEPENDENT_AMBULATORY_CARE_PROVIDER_SITE_OTHER): Payer: Medicare PPO | Admitting: Physical Therapy

## 2021-07-16 DIAGNOSIS — M25612 Stiffness of left shoulder, not elsewhere classified: Secondary | ICD-10-CM | POA: Diagnosis not present

## 2021-07-16 DIAGNOSIS — R293 Abnormal posture: Secondary | ICD-10-CM

## 2021-07-16 DIAGNOSIS — M25561 Pain in right knee: Secondary | ICD-10-CM | POA: Diagnosis not present

## 2021-07-16 DIAGNOSIS — R600 Localized edema: Secondary | ICD-10-CM

## 2021-07-16 DIAGNOSIS — R296 Repeated falls: Secondary | ICD-10-CM | POA: Diagnosis not present

## 2021-07-16 DIAGNOSIS — M6281 Muscle weakness (generalized): Secondary | ICD-10-CM

## 2021-07-16 DIAGNOSIS — R262 Difficulty in walking, not elsewhere classified: Secondary | ICD-10-CM | POA: Diagnosis not present

## 2021-07-16 DIAGNOSIS — M25512 Pain in left shoulder: Secondary | ICD-10-CM | POA: Diagnosis not present

## 2021-07-16 NOTE — Therapy (Signed)
[facility] [address] [place], [place], [address] Phone: [phone]   Fax:  [phone]  Physical Therapy Treatment  Patient Details  Name: [name] MRN: [id number] Date of Birth: July 03, 1951 Referring Provider (PT): Donella Stade, [place]   Encounter Date: 07/16/2021   PT End of Session - 07/16/21 1602     Visit Number 21    Number of Visits 32    Date for PT Re-Evaluation 08/28/21    Authorization Type [employer]    Authorization Time Period 7/27-9/27    Authorization - Visit Number 1    Authorization - Number of Visits 12    Progress Note Due on Visit 30    PT Start Time 1432    PT Stop Time 1513    PT Time Calculation (min) 41 min    Activity Tolerance Patient tolerated treatment well    Behavior During Therapy [facility] for tasks assessed/performed             Past Medical History:  Diagnosis Date   Anxiety    Atrial fibrillation [facility])    sees Dr. Audie Box   Cancer [facility])    breast   Chronic pain    Concussion 02/2007   ICU x 3 days   Depression    Hypertension    Hypothyroidism    Personal history of chemotherapy    Personal history of radiation therapy    Spinal stenosis    Thyroid disease ?1994    Past Surgical History:  Procedure Laterality Date   BREAST BIOPSY Right 12/20/2019   x2   BREAST LUMPECTOMY Right 01/22/2020   BREAST LUMPECTOMY WITH RADIOACTIVE SEED AND SENTINEL LYMPH NODE BIOPSY Right 01/22/2020   Procedure: RIGHT BREAST LUMPECTOMY WITH RADIOACTIVE SEED X2 AND RIGHT SENTINEL LYMPH NODE MAPPING;  Surgeon: Erroll Luna, MD;  Location: [place];  Service: General;  Laterality: Right;   BREAST SURGERY Right 03/1998   breast biopsy, benign   EYE SURGERY Right    PORT-A-CATH REMOVAL Right 04/30/2021   Procedure: REMOVAL PORT-A-CATH;  Surgeon: Erroll Luna, MD;  Location: [place];  Service: General;  Laterality: Right;   PORTACATH PLACEMENT Right 01/22/2020    Procedure: INSERTION PORT-A-CATH WITH ULTRASOUND;  Surgeon: Erroll Luna, MD;  Location: [place];  Service: General;  Laterality: Right;   PORTACATH PLACEMENT Right 02/28/2020   Procedure: PORT A CATH REVISION;  Surgeon: Erroll Luna, MD;  Location: [place];  Service: General;  Laterality: Right;   REVERSE SHOULDER ARTHROPLASTY Left 02/02/2021   Procedure: LEFT REVERSE SHOULDER ARTHROPLASTY;  Surgeon: Meredith Pel, MD;  Location: [place];  Service: Orthopedics;  Laterality: Left;   SHOULDER SURGERY Right    SPINAL FUSION  03/04/2011   with ORIF    There were no vitals filed for this visit.   Subjective Assessment - 07/16/21 1437     Subjective no pain in shoulder today, Rt knee is still incredibly painful rated "8, 9, 10"    Pertinent History anxiety, breast cancer with lumpectomy and radioactive seed placement 2021 and recent port removal, chronic pain, concussion 2008, HTN, depression, spinal fusion 2012.    Patient Stated Goals improve function of Lt arm, raise shoulder over her head to do her hair    Currently in Pain? Yes    Pain Score 9     Pain Location Knee    Pain Orientation Right;Medial    Pain Descriptors / Indicators Aching;Sore;Constant;Sharp    Pain Type  Acute pain    Pain Onset 1 to 4 weeks ago    Pain Frequency Constant    Aggravating Factors  weight bearing    Pain Relieving Factors sitting, ice    Pain Onset More than a month ago                               [facility] Adult PT Treatment/Exercise - 07/16/21 1438       Shoulder Exercises: Supine   Flexion Left   1# to fatigue; 30 reps; 20 reps     Shoulder Exercises: Seated   Flexion AROM;Both;10 reps    Flexion Weight (lbs) with elbow flexion    Abduction AROM;10 reps;Left    ABduction Limitations with elbow flexion reaching to head      Shoulder Exercises: Pulleys   Flexion 3 minutes    ABduction 3 minutes      Manual Therapy   Manual therapy comments PROM  left shoulder to tolerance all planes, STM with compression to Rt medial knee                      PT Short Term Goals - 07/07/21 1420       PT SHORT TERM GOAL #1   Title independent with intial HEP    Status Achieved      PT SHORT TERM GOAL #2   Title improve Lt shoulder passive ER to 30 deg    Status Achieved               PT Long Term Goals - 07/14/21 1633       PT LONG TERM GOAL #1   Title independent with final HEP    Status On-going      PT LONG TERM GOAL #2   Title Lt shoulder active flexion and abduction improved to at least 90 deg in order to perform ADLs    Baseline met    Status Achieved      PT LONG TERM GOAL #3   Title report pain < 3/10 with activity for improved function    Status On-going      PT LONG TERM GOAL #4   Title FOTO score improved to 61 for improved function    Status On-going      PT LONG TERM GOAL #5   Title demonstrate at least 3/5 Lt shoulder flex/abduction strength for improved function    Status On-going      Additional Long Term Goals   Additional Long Term Goals Yes      PT LONG TERM GOAL #6   Title Pt will be able to perform sit to stand with UE support independently with pain </= 3/10.    Time 6    Period Weeks    Status New    Target Date 08/28/21      PT LONG TERM GOAL #7   Title Pt will improve her right knee strength to >/=4/5.    Time 6    Period Weeks    Status New    Target Date 08/28/21                   Plan - 07/16/21 1603     Clinical Impression Statement Limited tolerance to exercise today due to continued Rt knee pain.  Did perform sitting and supine shoulder activiites with good tolerance.  Will continue to benefit from PT to maximize  function.    Personal Factors and Comorbidities Comorbidity 3+    Comorbidities anxiety, breast cancer with lumpectomy and radioactive seed placement 2021 and recent port removal, chronic pain, concussion 2008, HTN, depression, spinal fusion 2012     Examination-Activity Limitations Locomotion Level;Reach Overhead;Dressing;Hygiene/Grooming;Lift;Toileting;Bathing    Examination-Participation Restrictions Cleaning;Meal Prep;Community Activity;Driving;Laundry    Stability/Clinical Decision Making Evolving/Moderate complexity    Rehab Potential Fair    PT Frequency 2x / week    PT Duration 6 weeks    PT Treatment/Interventions ADLs/Self Care Home Management;Therapeutic exercise;Patient/family education;Cryotherapy;DME Instruction;Ultrasound;Moist Heat;Functional mobility training;Therapeutic activities;Balance training;Neuromuscular re-education;Manual techniques;Vasopneumatic Device;Taping;Dry needling;Passive range of motion;Electrical Stimulation    PT Next Visit Plan measure and send note to dean F/u with how DN went in right knee, continue to progress shoulder ROM/strengthening    PT Home Exercise Plan Access Code: [email or web address], added AROM flexion and abd against gravity in sitting and ER with red band    Consulted and Agree with Plan of Care Patient             Patient will benefit from skilled therapeutic intervention in order to improve the following deficits and impairments:  Postural dysfunction, Decreased range of motion, Decreased knowledge of precautions, Increased edema, Impaired UE functional use, Decreased strength, Decreased mobility, Decreased balance, Pain, Difficulty walking, Decreased activity tolerance  Visit Diagnosis: Acute pain of left shoulder  Stiffness of left shoulder, not elsewhere classified  Abnormal posture  Repeated falls  Localized edema  Muscle weakness (generalized)  Difficulty in walking, not elsewhere classified  Acute pain of right knee     Problem List Patient Active Problem List   Diagnosis Date Noted   Arthritis of left shoulder region    S/P reverse total shoulder arthroplasty, 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   Vitamin D deficiency 01/15/2021   Vitamin B12 deficiency 01/15/2021   Primary  insomnia 01/15/2021   Morbid obesity ([place]) 01/15/2021   Moderate recurrent major depression ([place]) 01/15/2021   Benzodiazepine dependence ([place]) 01/15/2021   Port-A-Cath in place 07/22/2020   Left leg cellulitis 04/17/2020   Cellulitis of left leg 04/17/2020   Hypokalemia 04/17/2020   Macrocytic anemia 04/17/2020   Anxiety 04/17/2020   Depression 04/17/2020   Chronic diarrhea 04/17/2020   Chemotherapy induced diarrhea 04/17/2020   Malignant neoplasm of upper-inner quadrant of right breast in female, estrogen receptor positive ([place]) 12/26/2019   Encephalopathy 02/02/2018   Hypothyroidism 02/02/2018   AKI (acute kidney injury) ([place]) 02/02/2018   Chronic back pain 02/02/2018   Alcohol abuse 02/02/2018   Weight gain 02/02/2018   Benign essential HTN 02/02/2018      Laureen Abrahams, PT, DPT 07/16/21 4:05 PM    [facility] [address] [place], [place], [address] Phone: [phone]   Fax:  [phone]  Name: ZURIEL YEAMAN MRN: [id number] Date of Birth: [birth year]

## 2021-07-20 ENCOUNTER — Ambulatory Visit (INDEPENDENT_AMBULATORY_CARE_PROVIDER_SITE_OTHER): Payer: Medicare PPO | Admitting: Orthopedic Surgery

## 2021-07-20 ENCOUNTER — Encounter: Payer: Self-pay | Admitting: Physical Therapy

## 2021-07-20 ENCOUNTER — Ambulatory Visit (INDEPENDENT_AMBULATORY_CARE_PROVIDER_SITE_OTHER): Payer: Medicare PPO | Admitting: Physical Therapy

## 2021-07-20 ENCOUNTER — Other Ambulatory Visit: Payer: Self-pay

## 2021-07-20 DIAGNOSIS — R296 Repeated falls: Secondary | ICD-10-CM | POA: Diagnosis not present

## 2021-07-20 DIAGNOSIS — M25612 Stiffness of left shoulder, not elsewhere classified: Secondary | ICD-10-CM | POA: Diagnosis not present

## 2021-07-20 DIAGNOSIS — M25561 Pain in right knee: Secondary | ICD-10-CM

## 2021-07-20 DIAGNOSIS — R600 Localized edema: Secondary | ICD-10-CM

## 2021-07-20 DIAGNOSIS — M25512 Pain in left shoulder: Secondary | ICD-10-CM

## 2021-07-20 DIAGNOSIS — M6281 Muscle weakness (generalized): Secondary | ICD-10-CM

## 2021-07-20 DIAGNOSIS — R262 Difficulty in walking, not elsewhere classified: Secondary | ICD-10-CM

## 2021-07-20 DIAGNOSIS — R293 Abnormal posture: Secondary | ICD-10-CM | POA: Diagnosis not present

## 2021-07-20 MED ORDER — HYDROCODONE-ACETAMINOPHEN 5-325 MG PO TABS: 1.0000 | ORAL_TABLET | Freq: Four times a day (QID) | ORAL | 0 refills | Status: DC | PRN

## 2021-07-20 NOTE — Therapy (Signed)
[facility] Physical Therapy [address] [place], [place], [address] Phone: [phone]   Fax:  [phone]  Physical Therapy Treatment  Patient Details  Name: [name] MRN: [id number] Date of Birth: [birth year] Referring Provider (PT): Donella Stade, [place]   Encounter Date: 07/20/2021   PT End of Session - 07/20/21 1340     Visit Number 22    Number of Visits 32    Date for PT Re-Evaluation 08/28/21    Authorization Type [employer]    Authorization Time Period 7/27-9/27    Authorization - Visit Number 2    Authorization - Number of Visits 12    Progress Note Due on Visit 30    PT Start Time 1300    PT Stop Time 1340    PT Time Calculation (min) 40 min    Activity Tolerance Patient tolerated treatment well    Behavior During Therapy [facility] for tasks assessed/performed             Past Medical History:  Diagnosis Date   Anxiety    Atrial fibrillation [facility])    sees Dr. Audie Box   Cancer [facility])    breast   Chronic pain    Concussion 02/2007   ICU x 3 days   Depression    Hypertension    Hypothyroidism    Personal history of chemotherapy    Personal history of radiation therapy    Spinal stenosis    Thyroid disease ?1994    Past Surgical History:  Procedure Laterality Date   BREAST BIOPSY Right 12/20/2019   x2   BREAST LUMPECTOMY Right 01/22/2020   BREAST LUMPECTOMY WITH RADIOACTIVE SEED AND SENTINEL LYMPH NODE BIOPSY Right 01/22/2020   Procedure: RIGHT BREAST LUMPECTOMY WITH RADIOACTIVE SEED X2 AND RIGHT SENTINEL LYMPH NODE MAPPING;  Surgeon: Erroll Luna, MD;  Location: [place];  Service: General;  Laterality: Right;   BREAST SURGERY Right 03/1998   breast biopsy, benign   EYE SURGERY Right    PORT-A-CATH REMOVAL Right 04/30/2021   Procedure: REMOVAL PORT-A-CATH;  Surgeon: Erroll Luna, MD;  Location: [place];  Service: General;  Laterality: Right;   PORTACATH PLACEMENT Right 01/22/2020    Procedure: INSERTION PORT-A-CATH WITH ULTRASOUND;  Surgeon: Erroll Luna, MD;  Location: [place];  Service: General;  Laterality: Right;   PORTACATH PLACEMENT Right 02/28/2020   Procedure: PORT A CATH REVISION;  Surgeon: Erroll Luna, MD;  Location: [place];  Service: General;  Laterality: Right;   REVERSE SHOULDER ARTHROPLASTY Left 02/02/2021   Procedure: LEFT REVERSE SHOULDER ARTHROPLASTY;  Surgeon: Meredith Pel, MD;  Location: [place];  Service: Orthopedics;  Laterality: Left;   SHOULDER SURGERY Right    SPINAL FUSION  03/04/2011   with ORIF    There were no vitals filed for this visit.   Subjective Assessment - 07/20/21 1304     Subjective Rt knee is still very painful; feels she's losing some ground with the shoulder due to knee pain    Pertinent History anxiety, breast cancer with lumpectomy and radioactive seed placement 2021 and recent port removal, chronic pain, concussion 2008, HTN, depression, spinal fusion 2012.    Patient Stated Goals improve function of Lt arm, raise shoulder over her head to do her hair    Currently in Pain? Yes    Pain Score 8     Pain Location Knee    Pain Orientation Right;Medial    Pain Descriptors / Indicators Aching;Sore;Sharp;Constant  Pain Type Acute pain    Pain Onset 1 to 4 weeks ago    Aggravating Factors  pain is constant, worse with standing    Pain Relieving Factors sitting, ice    Pain Onset More than a month ago                [facility] PT Assessment - 07/20/21 1309       Assessment   Medical Diagnosis Z96.612 (ICD-10-CM) - History of arthroplasty of left shoulder    Referring Provider (PT) Magnant, Gerrianne Scale, PA-C    Onset Date/Surgical Date 02/02/21      AROM   Left Shoulder Flexion 134 Degrees   sitting   Left Shoulder ABduction 114 Degrees   sitting   Left Shoulder External Rotation 45 Degrees                           [facility] Adult PT Treatment/Exercise - 07/20/21 1305        Shoulder Exercises: Seated   Row Both;20 reps;Theraband    Theraband Level (Shoulder Row) Level 3 (Green)    External Rotation Left;20 reps;Theraband    Theraband Level (Shoulder External Rotation) Level 3 (Green)    External Rotation Limitations limited motion    Internal Rotation 20 reps;Theraband;Left    Theraband Level (Shoulder Internal Rotation) Level 3 (Green)    Flexion Left;20 reps;Weights    Flexion Weight (lbs) 1    Abduction AROM;20 reps;Weights    ABduction Limitations 1    Other Seated Exercises bicep curl 1# 2x10 reps      Shoulder Exercises: Standing   Other Standing Exercises modified overhead press with 1# on Lt x10      Shoulder Exercises: Pulleys   Flexion 3 minutes    ABduction 3 minutes                      PT Short Term Goals - 07/07/21 1420       PT SHORT TERM GOAL #1   Title independent with intial HEP    Status Achieved      PT SHORT TERM GOAL #2   Title improve Lt shoulder passive ER to 30 deg    Status Achieved               PT Long Term Goals - 07/14/21 1633       PT LONG TERM GOAL #1   Title independent with final HEP    Status On-going      PT LONG TERM GOAL #2   Title Lt shoulder active flexion and abduction improved to at least 90 deg in order to perform ADLs    Baseline met    Status Achieved      PT LONG TERM GOAL #3   Title report pain < 3/10 with activity for improved function    Status On-going      PT LONG TERM GOAL #4   Title FOTO score improved to 61 for improved function    Status On-going      PT LONG TERM GOAL #5   Title demonstrate at least 3/5 Lt shoulder flex/abduction strength for improved function    Status On-going      Additional Long Term Goals   Additional Long Term Goals Yes      PT LONG TERM GOAL #6   Title Pt will be able to perform sit to stand with UE support independently with  pain </= 3/10.    Time 6    Period Weeks    Status New    Target Date 08/28/21      PT LONG  TERM GOAL #7   Title Pt will improve her right knee strength to >/=4/5.    Time 6    Period Weeks    Status New    Target Date 08/28/21                   Plan - 07/20/21 1341     Clinical Impression Statement Pt tolerated exercises well today, all performed seated due to Rt knee pain.  Overall demonstrating steady improvement in AROM today.  Will continue to benefit from PT to maximize function.    Personal Factors and Comorbidities Comorbidity 3+    Comorbidities anxiety, breast cancer with lumpectomy and radioactive seed placement 2021 and recent port removal, chronic pain, concussion 2008, HTN, depression, spinal fusion 2012    Examination-Activity Limitations Locomotion Level;Reach Overhead;Dressing;Hygiene/Grooming;Lift;Toileting;Bathing    Examination-Participation Restrictions Cleaning;Meal Prep;Community Activity;Driving;Laundry    Stability/Clinical Decision Making Evolving/Moderate complexity    Rehab Potential Fair    PT Frequency 2x / week    PT Duration 6 weeks    PT Treatment/Interventions ADLs/Self Care Home Management;Therapeutic exercise;Patient/family education;Cryotherapy;DME Instruction;Ultrasound;Moist Heat;Functional mobility training;Therapeutic activities;Balance training;Neuromuscular re-education;Manual techniques;Vasopneumatic Device;Taping;Dry needling;Passive range of motion;Electrical Stimulation    PT Next Visit Plan continue to progress shoulder ROM/strengthening, see what Marlou Sa says    PT Home Exercise Plan Access Code: [email or web address], added AROM flexion and abd against gravity in sitting and ER with red band    Consulted and Agree with Plan of Care Patient             Patient will benefit from skilled therapeutic intervention in order to improve the following deficits and impairments:  Postural dysfunction, Decreased range of motion, Decreased knowledge of precautions, Increased edema, Impaired UE functional use, Decreased strength, Decreased  mobility, Decreased balance, Pain, Difficulty walking, Decreased activity tolerance  Visit Diagnosis: Acute pain of left shoulder  Stiffness of left shoulder, not elsewhere classified  Abnormal posture  Repeated falls  Localized edema  Muscle weakness (generalized)  Difficulty in walking, not elsewhere classified  Acute pain of right knee     Problem List Patient Active Problem List   Diagnosis Date Noted   Arthritis of left shoulder region    S/P reverse total shoulder arthroplasty, 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   Vitamin D deficiency 01/15/2021   Vitamin B12 deficiency 01/15/2021   Primary insomnia 01/15/2021   Morbid obesity ([place]) 01/15/2021   Moderate recurrent major depression ([place]) 01/15/2021   Benzodiazepine dependence ([place]) 01/15/2021   Port-A-Cath in place 07/22/2020   Left leg cellulitis 04/17/2020   Cellulitis of left leg 04/17/2020   Hypokalemia 04/17/2020   Macrocytic anemia 04/17/2020   Anxiety 04/17/2020   Depression 04/17/2020   Chronic diarrhea 04/17/2020   Chemotherapy induced diarrhea 04/17/2020   Malignant neoplasm of upper-inner quadrant of right breast in female, estrogen receptor positive ([place]) 12/26/2019   Encephalopathy 02/02/2018   Hypothyroidism 02/02/2018   AKI (acute kidney injury) ([place]) 02/02/2018   Chronic back pain 02/02/2018   Alcohol abuse 02/02/2018   Weight gain 02/02/2018   Benign essential HTN 02/02/2018      Laureen Abrahams, PT, DPT 07/20/21 1:44 PM     [facility] [address] [place], [place], [address] Phone: [phone]   Fax:  [phone]  Name: [name] MRN: [id number] Date of  Birth: 11/25/1951

## 2021-07-22 ENCOUNTER — Other Ambulatory Visit: Payer: Self-pay

## 2021-07-22 ENCOUNTER — Ambulatory Visit (INDEPENDENT_AMBULATORY_CARE_PROVIDER_SITE_OTHER): Payer: Medicare PPO | Admitting: Physical Therapy

## 2021-07-22 DIAGNOSIS — M25512 Pain in left shoulder: Secondary | ICD-10-CM

## 2021-07-22 DIAGNOSIS — R293 Abnormal posture: Secondary | ICD-10-CM | POA: Diagnosis not present

## 2021-07-22 DIAGNOSIS — M25612 Stiffness of left shoulder, not elsewhere classified: Secondary | ICD-10-CM | POA: Diagnosis not present

## 2021-07-22 DIAGNOSIS — M6281 Muscle weakness (generalized): Secondary | ICD-10-CM

## 2021-07-22 DIAGNOSIS — M25561 Pain in right knee: Secondary | ICD-10-CM | POA: Diagnosis not present

## 2021-07-22 DIAGNOSIS — R262 Difficulty in walking, not elsewhere classified: Secondary | ICD-10-CM | POA: Diagnosis not present

## 2021-07-22 DIAGNOSIS — R600 Localized edema: Secondary | ICD-10-CM | POA: Diagnosis not present

## 2021-07-22 NOTE — Therapy (Signed)
[facility] Physical Therapy [address] [place], [place], [address] Phone: [phone]   Fax:  [phone]  Physical Therapy Treatment  Patient Details  Name: [name] MRN: [id number] Date of Birth: 1951/10/06 Referring Provider (PT): Donella Stade, [place]   Encounter Date: 07/22/2021   PT End of Session - 07/22/21 1343     Visit Number 23    Number of Visits 32    Date for PT Re-Evaluation 08/28/21    Authorization Type [employer]    Authorization Time Period 7/27-9/27    Authorization - Visit Number 3    Authorization - Number of Visits 12    Progress Note Due on Visit 56    PT Start Time 1259    PT Stop Time 1339    PT Time Calculation (min) 40 min    Activity Tolerance Patient tolerated treatment well    Behavior During Therapy [facility] for tasks assessed/performed             Past Medical History:  Diagnosis Date   Anxiety    Atrial fibrillation [facility])    sees Dr. Audie Box   Cancer [facility])    breast   Chronic pain    Concussion 02/2007   ICU x 3 days   Depression    Hypertension    Hypothyroidism    Personal history of chemotherapy    Personal history of radiation therapy    Spinal stenosis    Thyroid disease ?1994    Past Surgical History:  Procedure Laterality Date   BREAST BIOPSY Right 12/20/2019   x2   BREAST LUMPECTOMY Right 01/22/2020   BREAST LUMPECTOMY WITH RADIOACTIVE SEED AND SENTINEL LYMPH NODE BIOPSY Right 01/22/2020   Procedure: RIGHT BREAST LUMPECTOMY WITH RADIOACTIVE SEED X2 AND RIGHT SENTINEL LYMPH NODE MAPPING;  Surgeon: Erroll Luna, MD;  Location: [place];  Service: General;  Laterality: Right;   BREAST SURGERY Right 03/1998   breast biopsy, benign   EYE SURGERY Right    PORT-A-CATH REMOVAL Right 04/30/2021   Procedure: REMOVAL PORT-A-CATH;  Surgeon: Erroll Luna, MD;  Location: [place];  Service: General;  Laterality: Right;   PORTACATH PLACEMENT Right 01/22/2020    Procedure: INSERTION PORT-A-CATH WITH ULTRASOUND;  Surgeon: Erroll Luna, MD;  Location: [place];  Service: General;  Laterality: Right;   PORTACATH PLACEMENT Right 02/28/2020   Procedure: PORT A CATH REVISION;  Surgeon: Erroll Luna, MD;  Location: [place];  Service: General;  Laterality: Right;   REVERSE SHOULDER ARTHROPLASTY Left 02/02/2021   Procedure: LEFT REVERSE SHOULDER ARTHROPLASTY;  Surgeon: Meredith Pel, MD;  Location: [place];  Service: Orthopedics;  Laterality: Left;   SHOULDER SURGERY Right    SPINAL FUSION  03/04/2011   with ORIF    There were no vitals filed for this visit.   Subjective Assessment - 07/22/21 1308     Subjective Rt knee is still very painful but able to take about 4 steps on it now, she will have MRI on Friday for her Rt knee. Denies shoulder pain today.    Pertinent History anxiety, breast cancer with lumpectomy and radioactive seed placement 2021 and recent port removal, chronic pain, concussion 2008, HTN, depression, spinal fusion 2012.    Patient Stated Goals improve function of Lt arm, raise shoulder over her head to do her hair    Pain Onset 1 to 4 weeks ago    Pain Onset More than a month ago  [facility] Adult PT Treatment/Exercise - 07/22/21 0001       Shoulder Exercises: Seated   Row Both;20 reps;Theraband    Theraband Level (Shoulder Row) Level 3 (Green)    External Rotation Left;20 reps;Theraband    Theraband Level (Shoulder External Rotation) Level 3 (Green)    External Rotation Limitations limited motion    Internal Rotation 20 reps;Theraband;Left    Theraband Level (Shoulder Internal Rotation) Level 3 (Green)    Flexion Left;20 reps;Weights;AAROM    Flexion Weight (lbs) 1    Abduction 20 reps;Weights;AAROM    ABduction Limitations 1    Other Seated Exercises ER AAROM 1# bar X20    Other Seated Exercises wall ladder X 10 flexion, X 10 scaption, chest press green  band X20 on Left      Shoulder Exercises: Pulleys   Flexion 3 minutes    ABduction 3 minutes                      PT Short Term Goals - 07/07/21 1420       PT SHORT TERM GOAL #1   Title independent with intial HEP    Status Achieved      PT SHORT TERM GOAL #2   Title improve Lt shoulder passive ER to 30 deg    Status Achieved               PT Long Term Goals - 07/14/21 1633       PT LONG TERM GOAL #1   Title independent with final HEP    Status On-going      PT LONG TERM GOAL #2   Title Lt shoulder active flexion and abduction improved to at least 90 deg in order to perform ADLs    Baseline met    Status Achieved      PT LONG TERM GOAL #3   Title report pain < 3/10 with activity for improved function    Status On-going      PT LONG TERM GOAL #4   Title FOTO score improved to 61 for improved function    Status On-going      PT LONG TERM GOAL #5   Title demonstrate at least 3/5 Lt shoulder flex/abduction strength for improved function    Status On-going      Additional Long Term Goals   Additional Long Term Goals Yes      PT LONG TERM GOAL #6   Title Pt will be able to perform sit to stand with UE support independently with pain </= 3/10.    Time 6    Period Weeks    Status New    Target Date 08/28/21      PT LONG TERM GOAL #7   Title Pt will improve her right knee strength to >/=4/5.    Time 6    Period Weeks    Status New    Target Date 08/28/21                   Plan - 07/22/21 1344     Clinical Impression Statement She continues to be limited by Rt knee pain and will have MRI on this friday. Focused today on improving Lt shoulder ROM and strength with good overall tolerance to this. Continue POC and check for MRI results next visit    Personal Factors and Comorbidities Comorbidity 3+    Comorbidities anxiety, breast cancer with lumpectomy and radioactive seed placement 2021 and recent port  removal, chronic pain,  concussion 2008, HTN, depression, spinal fusion 2012    Examination-Activity Limitations Locomotion Level;Reach Overhead;Dressing;Hygiene/Grooming;Lift;Toileting;Bathing    Examination-Participation Restrictions Cleaning;Meal Prep;Community Activity;Driving;Laundry    Stability/Clinical Decision Making Evolving/Moderate complexity    Rehab Potential Fair    PT Frequency 2x / week    PT Duration 6 weeks    PT Treatment/Interventions ADLs/Self Care Home Management;Therapeutic exercise;Patient/family education;Cryotherapy;DME Instruction;Ultrasound;Moist Heat;Functional mobility training;Therapeutic activities;Balance training;Neuromuscular re-education;Manual techniques;Vasopneumatic Device;Taping;Dry needling;Passive range of motion;Electrical Stimulation    PT Next Visit Plan continue to progress shoulder ROM/strengthening    PT Home Exercise Plan Access Code: [email or web address], added AROM flexion and abd against gravity in sitting and ER with red band    Consulted and Agree with Plan of Care Patient             Patient will benefit from skilled therapeutic intervention in order to improve the following deficits and impairments:  Postural dysfunction, Decreased range of motion, Decreased knowledge of precautions, Increased edema, Impaired UE functional use, Decreased strength, Decreased mobility, Decreased balance, Pain, Difficulty walking, Decreased activity tolerance  Visit Diagnosis: Acute pain of left shoulder  Stiffness of left shoulder, not elsewhere classified  Abnormal posture  Localized edema  Muscle weakness (generalized)  Difficulty in walking, not elsewhere classified  Acute pain of right knee     Problem List Patient Active Problem List   Diagnosis Date Noted   Arthritis of left shoulder region    S/P reverse total shoulder arthroplasty, 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   Vitamin D deficiency 01/15/2021   Vitamin B12 deficiency 01/15/2021   Primary insomnia 01/15/2021   Morbid  obesity ([place]) 01/15/2021   Moderate recurrent major depression ([place]) 01/15/2021   Benzodiazepine dependence ([place]) 01/15/2021   Port-A-Cath in place 07/22/2020   Left leg cellulitis 04/17/2020   Cellulitis of left leg 04/17/2020   Hypokalemia 04/17/2020   Macrocytic anemia 04/17/2020   Anxiety 04/17/2020   Depression 04/17/2020   Chronic diarrhea 04/17/2020   Chemotherapy induced diarrhea 04/17/2020   Malignant neoplasm of upper-inner quadrant of right breast in female, estrogen receptor positive ([place]) 12/26/2019   Encephalopathy 02/02/2018   Hypothyroidism 02/02/2018   AKI (acute kidney injury) ([place]) 02/02/2018   Chronic back pain 02/02/2018   Alcohol abuse 02/02/2018   Weight gain 02/02/2018   Benign essential HTN 02/02/2018    Silvestre Mesi 07/22/2021, 1:46 PM  [facility] Therapy [address] [place], [place], [address] Phone: [phone]   Fax:  [phone]  Name: HAWA HENLY MRN: [id number] Date of Birth: 1951/01/23

## 2021-07-23 DIAGNOSIS — E039 Hypothyroidism, unspecified: Secondary | ICD-10-CM | POA: Diagnosis not present

## 2021-07-24 ENCOUNTER — Ambulatory Visit
Admission: RE | Admit: 2021-07-24 | Discharge: 2021-07-24 | Disposition: A | Discharge status: Home / Self Care | Payer: Medicare PPO | Source: Ambulatory Visit | Attending: Orthopedic Surgery | Admitting: Orthopedic Surgery

## 2021-07-24 ENCOUNTER — Other Ambulatory Visit: Payer: Self-pay

## 2021-07-24 DIAGNOSIS — M25561 Pain in right knee: Secondary | ICD-10-CM

## 2021-07-26 ENCOUNTER — Encounter: Payer: Self-pay | Admitting: Orthopedic Surgery

## 2021-07-26 NOTE — Progress Notes (Signed)
Office Visit Note   Patient: [name]           Date of Birth: [birth year]           MRN: [id number] Visit Date: 07/20/2021 Requested by: Maurice Small, MD [place],  [place] [address] PCP: Maurice Small, MD  Subjective: Chief Complaint  Patient presents with   Right Knee - Pain    HPI: [name] is a 70 year old patient with severe right knee pain.  This is been going on for 4 weeks.  Has difficulty with weightbearing.  Saw a different PA a week ago and had injection from anterior approach without relief.  She has finished her vitamin D supplementation.  She is making incremental gains in range of motion with her left reverse shoulder replacement and is able to put her hand to her forehead at this time.              ROS: All systems reviewed are negative as they relate to the chief complaint within the history of present illness.  Patient denies  fevers or chills.   Assessment & Plan: Visit Diagnoses:  1. Acute pain of right knee     Plan: Impression is relatively acute onset of right knee pain with radiographs which showed no definite fracture and no significant arthritis.  I think this could represent stress fracture versus stress reaction.  Plan MRI scan right knee to evaluate stress fracture.  Norco prescribed for nighttime pain.  She may need to have a period of nonweightbearing on that right leg if this is a stress fracture/reaction  Follow-Up Instructions: Return for after MRI.   Orders:  Orders Placed This Encounter  Procedures   MR Knee Right w/o contrast   Meds ordered this encounter  Medications   HYDROcodone-acetaminophen (NORCO/VICODIN) 5-325 MG tablet    Sig: Take 1 tablet by mouth every 6 (six) hours as needed for moderate pain.    Dispense:  30 tablet    Refill:  0      Procedures: No procedures performed   Clinical Data: No additional findings.  Objective: Vital Signs: LMP  (LMP Unknown)   Physical Exam:    Constitutional: Patient appears well-developed HEENT:  Head: Normocephalic Eyes:EOM are normal Neck: Normal range of motion Cardiovascular: Normal rate Pulmonary/chest: Effort normal Neurologic: Patient is alert Skin: Skin is warm Psychiatric: Patient has normal mood and affect   Ortho Exam: Ortho exam demonstrates global right knee pain but mostly around the tibial plateau region medially.  Extensor mechanism is intact.  Collateral and cruciate ligaments are stable.  Pedal pulses palpable.  No calf tenderness negative Homans' sign on the right-hand side.  Does have difficulty weightbearing on that right-hand side.  No focal swelling in the tibial plateau region.  Specialty Comments:  No specialty comments available.  Imaging: No results found.   PMFS History: Patient Active Problem List   Diagnosis Date Noted   Arthritis of left shoulder region    S/P reverse total shoulder arthroplasty, 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   Vitamin D deficiency 01/15/2021   Vitamin B12 deficiency 01/15/2021   Primary insomnia 01/15/2021   Morbid obesity ([place]) 01/15/2021   Moderate recurrent major depression ([place]) 01/15/2021   Benzodiazepine dependence ([place]) 01/15/2021   Port-A-Cath in place 07/22/2020   Left leg cellulitis 04/17/2020   Cellulitis of left leg 04/17/2020   Hypokalemia 04/17/2020   Macrocytic anemia 04/17/2020   Anxiety 04/17/2020   Depression 04/17/2020   Chronic diarrhea  04/17/2020   Chemotherapy induced diarrhea 04/17/2020   Malignant neoplasm of upper-inner quadrant of right breast in female, estrogen receptor positive ([place]) 12/26/2019   Encephalopathy 02/02/2018   Hypothyroidism 02/02/2018   AKI (acute kidney injury) ([place]) 02/02/2018   Chronic back pain 02/02/2018   Alcohol abuse 02/02/2018   Weight gain 02/02/2018   Benign essential HTN 02/02/2018   Past Medical History:  Diagnosis Date   Anxiety    Atrial fibrillation [facility])    sees Dr. Audie Box   Cancer [facility])    breast    Chronic pain    Concussion 02/2007   ICU x 3 days   Depression    Hypertension    Hypothyroidism    Personal history of chemotherapy    Personal history of radiation therapy    Spinal stenosis    Thyroid disease ?1994    Family History  Problem Relation Age of Onset   Thyroid disease Mother    Dementia Mother    Diabetes Father    Stroke Father    Hypertension Sister    Diabetes Sister     Past Surgical History:  Procedure Laterality Date   BREAST BIOPSY Right 12/20/2019   x2   BREAST LUMPECTOMY Right 01/22/2020   BREAST LUMPECTOMY WITH RADIOACTIVE SEED AND SENTINEL LYMPH NODE BIOPSY Right 01/22/2020   Procedure: RIGHT BREAST LUMPECTOMY WITH RADIOACTIVE SEED X2 AND RIGHT SENTINEL LYMPH NODE MAPPING;  Surgeon: Erroll Luna, MD;  Location: [place];  Service: General;  Laterality: Right;   BREAST SURGERY Right 03/1998   breast biopsy, benign   EYE SURGERY Right    PORT-A-CATH REMOVAL Right 04/30/2021   Procedure: REMOVAL PORT-A-CATH;  Surgeon: Erroll Luna, MD;  Location: [place];  Service: General;  Laterality: Right;   PORTACATH PLACEMENT Right 01/22/2020   Procedure: INSERTION PORT-A-CATH WITH ULTRASOUND;  Surgeon: Erroll Luna, MD;  [place];  Service: General;  Laterality: Right;   PORTACATH PLACEMENT Right 02/28/2020   Procedure: PORT A CATH REVISION;  Surgeon: Erroll Luna, MD;  Location: [place];  Service: General;  Laterality: Right;   REVERSE SHOULDER ARTHROPLASTY Left 02/02/2021   Procedure: LEFT REVERSE SHOULDER ARTHROPLASTY;  Surgeon: Meredith Pel, MD;  Location: [place];  Service: Orthopedics;  Laterality: Left;   SHOULDER SURGERY Right    SPINAL FUSION  03/04/2011   with ORIF   Social History   Occupational History   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

## 2021-07-27 ENCOUNTER — Ambulatory Visit (INDEPENDENT_AMBULATORY_CARE_PROVIDER_SITE_OTHER): Payer: Medicare PPO | Admitting: Physical Therapy

## 2021-07-27 ENCOUNTER — Other Ambulatory Visit: Payer: Self-pay

## 2021-07-27 ENCOUNTER — Encounter: Payer: Self-pay | Admitting: Physical Therapy

## 2021-07-27 DIAGNOSIS — M25512 Pain in left shoulder: Secondary | ICD-10-CM

## 2021-07-27 DIAGNOSIS — R600 Localized edema: Secondary | ICD-10-CM | POA: Diagnosis not present

## 2021-07-27 DIAGNOSIS — M6281 Muscle weakness (generalized): Secondary | ICD-10-CM

## 2021-07-27 DIAGNOSIS — R293 Abnormal posture: Secondary | ICD-10-CM

## 2021-07-27 DIAGNOSIS — M25612 Stiffness of left shoulder, not elsewhere classified: Secondary | ICD-10-CM

## 2021-07-27 NOTE — Progress Notes (Signed)
I called and left message on her machine.  She has stress fracture in the knee as we talked.  Needs knee scooter and nonweightbearing for 4 to 6 weeks.  I would say come back in 4 weeks for clinical recheck to decide about weightbearing.  Thanks

## 2021-07-27 NOTE — Therapy (Signed)
[facility] [address] [place], [place], [address] Phone: [phone]   Fax:  [phone]  Physical Therapy Treatment  Patient Details  Name: [name] MRN: [id number] Date of Birth: 1951/10/20 Referring Provider (PT): Donella Stade, [place]   Encounter Date: 07/27/2021   PT End of Session - 07/27/21 1202     Visit Number 24    Number of Visits 32    Date for PT Re-Evaluation 08/28/21    Authorization Type [employer]    Authorization Time Period 7/27-9/27    Authorization - Visit Number 4    Authorization - Number of Visits 12    Progress Note Due on Visit 30    PT Start Time 1014    PT Stop Time 1057    PT Time Calculation (min) 43 min    Activity Tolerance Patient tolerated treatment well    Behavior During Therapy [facility] for tasks assessed/performed             Past Medical History:  Diagnosis Date   Anxiety    Atrial fibrillation [facility])    sees Dr. Audie Box   Cancer [facility])    breast   Chronic pain    Concussion 02/2007   ICU x 3 days   Depression    Hypertension    Hypothyroidism    Personal history of chemotherapy    Personal history of radiation therapy    Spinal stenosis    Thyroid disease ?1994    Past Surgical History:  Procedure Laterality Date   BREAST BIOPSY Right 12/20/2019   x2   BREAST LUMPECTOMY Right 01/22/2020   BREAST LUMPECTOMY WITH RADIOACTIVE SEED AND SENTINEL LYMPH NODE BIOPSY Right 01/22/2020   Procedure: RIGHT BREAST LUMPECTOMY WITH RADIOACTIVE SEED X2 AND RIGHT SENTINEL LYMPH NODE MAPPING;  Surgeon: Erroll Luna, MD;  Location: [place];  Service: General;  Laterality: Right;   BREAST SURGERY Right 03/1998   breast biopsy, benign   EYE SURGERY Right    PORT-A-CATH REMOVAL Right 04/30/2021   Procedure: REMOVAL PORT-A-CATH;  Surgeon: Erroll Luna, MD;  Location: [place];  Service: General;  Laterality: Right;   PORTACATH PLACEMENT Right 01/22/2020    Procedure: INSERTION PORT-A-CATH WITH ULTRASOUND;  Surgeon: Erroll Luna, MD;  Location: [place];  Service: General;  Laterality: Right;   PORTACATH PLACEMENT Right 02/28/2020   Procedure: PORT A CATH REVISION;  Surgeon: Erroll Luna, MD;  Location: [place];  Service: General;  Laterality: Right;   REVERSE SHOULDER ARTHROPLASTY Left 02/02/2021   Procedure: LEFT REVERSE SHOULDER ARTHROPLASTY;  Surgeon: Meredith Pel, MD;  Location: [place];  Service: Orthopedics;  Laterality: Left;   SHOULDER SURGERY Right    SPINAL FUSION  03/04/2011   with ORIF    There were no vitals filed for this visit.   Subjective Assessment - 07/27/21 1021     Subjective using walker today, knee still hurts    Pertinent History anxiety, breast cancer with lumpectomy and radioactive seed placement 2021 and recent port removal, chronic pain, concussion 2008, HTN, depression, spinal fusion 2012.    Patient Stated Goals improve function of Lt arm, raise shoulder over her head to do her hair    Currently in Pain? Yes   no shoulder pain   Pain Score 7     Pain Location Knee    Pain Orientation Right;Medial    Pain Descriptors / Indicators Aching;Sore;Sharp;Contraction    Pain Type Acute pain  Pain Onset 1 to 4 weeks ago    Pain Frequency Constant    Aggravating Factors  pain is constant, worse with standing    Pain Relieving Factors sitting, ice    Pain Onset More than a month ago                               [facility] Adult PT Treatment/Exercise - 07/27/21 1028       Shoulder Exercises: Seated   Flexion Left;20 reps;Weights;AROM    Flexion Weight (lbs) 1    Abduction Left;20 reps;Weights    ABduction Limitations 1    Other Seated Exercises bicep curl 2# LLE 2x10      Shoulder Exercises: Pulleys   Flexion 3 minutes    ABduction 3 minutes                      PT Short Term Goals - 07/07/21 1420       PT SHORT TERM GOAL #1   Title independent  with intial HEP    Status Achieved      PT SHORT TERM GOAL #2   Title improve Lt shoulder passive ER to 30 deg    Status Achieved               PT Long Term Goals - 07/14/21 1633       PT LONG TERM GOAL #1   Title independent with final HEP    Status On-going      PT LONG TERM GOAL #2   Title Lt shoulder active flexion and abduction improved to at least 90 deg in order to perform ADLs    Baseline met    Status Achieved      PT LONG TERM GOAL #3   Title report pain < 3/10 with activity for improved function    Status On-going      PT LONG TERM GOAL #4   Title FOTO score improved to 61 for improved function    Status On-going      PT LONG TERM GOAL #5   Title demonstrate at least 3/5 Lt shoulder flex/abduction strength for improved function    Status On-going      Additional Long Term Goals   Additional Long Term Goals Yes      PT LONG TERM GOAL #6   Title Pt will be able to perform sit to stand with UE support independently with pain </= 3/10.    Time 6    Period Weeks    Status New    Target Date 08/28/21      PT LONG TERM GOAL #7   Title Pt will improve her right knee strength to >/=4/5.    Time 6    Period Weeks    Status New    Target Date 08/28/21                   Plan - 07/27/21 1202     Clinical Impression Statement Pt with Rt knee tibial stress fx and MD recommending she use knee scooter at this time for mobility.  Seated shoulder exercises continued today and tolerated well.  Will continue to benefit from PT to maximize function.    Personal Factors and Comorbidities Comorbidity 3+    Comorbidities anxiety, breast cancer with lumpectomy and radioactive seed placement 2021 and recent port removal, chronic pain, concussion 2008, HTN, depression, spinal fusion 2012  Examination-Activity Limitations Locomotion Level;Reach Overhead;Dressing;Hygiene/Grooming;Lift;Toileting;Bathing    Examination-Participation Restrictions Cleaning;Meal  Prep;Community Activity;Driving;Laundry    Stability/Clinical Decision Making Evolving/Moderate complexity    Rehab Potential Fair    PT Frequency 2x / week    PT Duration 6 weeks    PT Treatment/Interventions ADLs/Self Care Home Management;Therapeutic exercise;Patient/family education;Cryotherapy;DME Instruction;Ultrasound;Moist Heat;Functional mobility training;Therapeutic activities;Balance training;Neuromuscular re-education;Manual techniques;Vasopneumatic Device;Taping;Dry needling;Passive range of motion;Electrical Stimulation    PT Next Visit Plan continue to progress shoulder ROM/strengthening, NWB with knee scooter at this time    PT Home Exercise Plan Access Code: [email or web address], added AROM flexion and abd against gravity in sitting and ER with red band    Consulted and Agree with Plan of Care Patient             Patient will benefit from skilled therapeutic intervention in order to improve the following deficits and impairments:  Postural dysfunction, Decreased range of motion, Decreased knowledge of precautions, Increased edema, Impaired UE functional use, Decreased strength, Decreased mobility, Decreased balance, Pain, Difficulty walking, Decreased activity tolerance  Visit Diagnosis: Acute pain of left shoulder  Stiffness of left shoulder, not elsewhere classified  Abnormal posture  Localized edema  Muscle weakness (generalized)     Problem List Patient Active Problem List   Diagnosis Date Noted   Arthritis of left shoulder region    S/P reverse total shoulder arthroplasty, 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   Vitamin D deficiency 01/15/2021   Vitamin B12 deficiency 01/15/2021   Primary insomnia 01/15/2021   Morbid obesity ([place]) 01/15/2021   Moderate recurrent major depression ([place]) 01/15/2021   Benzodiazepine dependence ([place]) 01/15/2021   Port-A-Cath in place 07/22/2020   Left leg cellulitis 04/17/2020   Cellulitis of left leg 04/17/2020   Hypokalemia 04/17/2020   Macrocytic  anemia 04/17/2020   Anxiety 04/17/2020   Depression 04/17/2020   Chronic diarrhea 04/17/2020   Chemotherapy induced diarrhea 04/17/2020   Malignant neoplasm of upper-inner quadrant of right breast in female, estrogen receptor positive ([place]) 12/26/2019   Encephalopathy 02/02/2018   Hypothyroidism 02/02/2018   AKI (acute kidney injury) ([place]) 02/02/2018   Chronic back pain 02/02/2018   Alcohol abuse 02/02/2018   Weight gain 02/02/2018   Benign essential HTN 02/02/2018       Laureen Abrahams, PT, DPT 07/27/21 12:06 PM     [facility] [address] [place], [place], [address] Phone: [phone]   Fax:  [phone]  Name: [name] MRN: [id number] Date of Birth: [birth year]

## 2021-07-29 NOTE — Progress Notes (Signed)
IC patient. Advised per Dr Forbes Cellar note. Husband has knee scooter ordered for her. Scheduled her for follow up visit on 09/07

## 2021-07-29 NOTE — Progress Notes (Signed)
tyvm

## 2021-07-30 ENCOUNTER — Other Ambulatory Visit: Payer: Self-pay | Admitting: Cardiovascular Disease

## 2021-07-30 ENCOUNTER — Ambulatory Visit (INDEPENDENT_AMBULATORY_CARE_PROVIDER_SITE_OTHER): Payer: Medicare PPO | Admitting: Physical Therapy

## 2021-07-30 ENCOUNTER — Encounter: Payer: Self-pay | Admitting: Physical Therapy

## 2021-07-30 ENCOUNTER — Other Ambulatory Visit: Payer: Self-pay

## 2021-07-30 DIAGNOSIS — R600 Localized edema: Secondary | ICD-10-CM | POA: Diagnosis not present

## 2021-07-30 DIAGNOSIS — M6281 Muscle weakness (generalized): Secondary | ICD-10-CM | POA: Diagnosis not present

## 2021-07-30 DIAGNOSIS — R262 Difficulty in walking, not elsewhere classified: Secondary | ICD-10-CM

## 2021-07-30 DIAGNOSIS — R293 Abnormal posture: Secondary | ICD-10-CM

## 2021-07-30 DIAGNOSIS — M25561 Pain in right knee: Secondary | ICD-10-CM

## 2021-07-30 DIAGNOSIS — M25512 Pain in left shoulder: Secondary | ICD-10-CM

## 2021-07-30 DIAGNOSIS — M25612 Stiffness of left shoulder, not elsewhere classified: Secondary | ICD-10-CM

## 2021-07-30 NOTE — Telephone Encounter (Signed)
Prescription refill request for Eliquis received. Last office visit:02/25/21 oneal  Scr: 0.69 04/28/21 Age: 54fWeight:105.4kg

## 2021-07-30 NOTE — Therapy (Signed)
[facility] [address] [place], [place], [address] Phone: [phone]   Fax:  [phone]  Physical Therapy Treatment  Patient Details  Name: [name] MRN: [id number] Date of Birth: 09-28-1951 Referring Provider (PT): Donella Stade, [place]   Encounter Date: 07/30/2021   PT End of Session - 07/30/21 1552     Visit Number 25    Number of Visits 32    Date for PT Re-Evaluation 08/28/21    Authorization Type [employer]    Authorization Time Period 7/27-9/27    Authorization - Visit Number 5    Authorization - Number of Visits 12    Progress Note Due on Visit 30    PT Start Time 1512    PT Stop Time 1600    PT Time Calculation (min) 48 min    Activity Tolerance Patient tolerated treatment well    Behavior During Therapy [facility] for tasks assessed/performed             Past Medical History:  Diagnosis Date   Anxiety    Atrial fibrillation [facility])    sees Dr. Audie Box   Cancer [facility])    breast   Chronic pain    Concussion 02/2007   ICU x 3 days   Depression    Hypertension    Hypothyroidism    Personal history of chemotherapy    Personal history of radiation therapy    Spinal stenosis    Thyroid disease ?1994    Past Surgical History:  Procedure Laterality Date   BREAST BIOPSY Right 12/20/2019   x2   BREAST LUMPECTOMY Right 01/22/2020   BREAST LUMPECTOMY WITH RADIOACTIVE SEED AND SENTINEL LYMPH NODE BIOPSY Right 01/22/2020   Procedure: RIGHT BREAST LUMPECTOMY WITH RADIOACTIVE SEED X2 AND RIGHT SENTINEL LYMPH NODE MAPPING;  Surgeon: Erroll Luna, MD;  Location: [place];  Service: General;  Laterality: Right;   BREAST SURGERY Right 03/1998   breast biopsy, benign   EYE SURGERY Right    PORT-A-CATH REMOVAL Right 04/30/2021   Procedure: REMOVAL PORT-A-CATH;  Surgeon: Erroll Luna, MD;  Location: [place];  Service: General;  Laterality: Right;   PORTACATH PLACEMENT Right 01/22/2020    Procedure: INSERTION PORT-A-CATH WITH ULTRASOUND;  Surgeon: Erroll Luna, MD;  Location: [place];  Service: General;  Laterality: Right;   PORTACATH PLACEMENT Right 02/28/2020   Procedure: PORT A CATH REVISION;  Surgeon: Erroll Luna, MD;  Location: [place];  Service: General;  Laterality: Right;   REVERSE SHOULDER ARTHROPLASTY Left 02/02/2021   Procedure: LEFT REVERSE SHOULDER ARTHROPLASTY;  Surgeon: Meredith Pel, MD;  Location: [place];  Service: Orthopedics;  Laterality: Left;   SHOULDER SURGERY Right    SPINAL FUSION  03/04/2011   with ORIF    There were no vitals filed for this visit.   Subjective Assessment - 07/30/21 1515     Subjective got a knee scooter but it's too painful on her knee putting pressure on there so she's using a w/c for now    Pertinent History anxiety, breast cancer with lumpectomy and radioactive seed placement 2021 and recent port removal, chronic pain, concussion 2008, HTN, depression, spinal fusion 2012.    Patient Stated Goals improve function of Lt arm, raise shoulder over her head to do her hair    Currently in Pain? Yes    Pain Score 3     Pain Location Shoulder    Pain Orientation Left    Pain Descriptors /  Indicators Aching;Sore    Pain Type Acute pain    Pain Onset 1 to 4 weeks ago    Pain Frequency Constant    Aggravating Factors  pain is constant, worse with standing    Pain Relieving Factors sitting, ice                               [facility] Adult PT Treatment/Exercise - 07/30/21 1518       Self-Care   Self-Care Other Self-Care Comments    Other Self-Care Comments  discussed having husband assist with holding arm as pt can reach head with support - pt will see if he's able to assist      Shoulder Exercises: Seated   Row Both;Theraband   3x10   Theraband Level (Shoulder Row) Level 4 (Blue)    Flexion Left;20 reps;Weights;AROM    Flexion Weight (lbs) 1    Abduction Left;20 reps;Weights     ABduction Limitations 1    Other Seated Exercises sustained abduction then flexion hold 30 sec x 3 on Lt    Other Seated Exercises bicep curl 2# LLE 3x10; elbow flexion in 90 deg abdct and 90 deg flex x 20 reps each      Shoulder Exercises: Pulleys   Flexion 3 minutes    ABduction 3 minutes      Vasopneumatic   Number Minutes Vasopneumatic  10 minutes    Vasopnuematic Location  Shoulder    Vasopneumatic Pressure Low    Vasopneumatic Temperature  34                      PT Short Term Goals - 07/07/21 1420       PT SHORT TERM GOAL #1   Title independent with intial HEP    Status Achieved      PT SHORT TERM GOAL #2   Title improve Lt shoulder passive ER to 30 deg    Status Achieved               PT Long Term Goals - 07/14/21 1633       PT LONG TERM GOAL #1   Title independent with final HEP    Status On-going      PT LONG TERM GOAL #2   Title Lt shoulder active flexion and abduction improved to at least 90 deg in order to perform ADLs    Baseline met    Status Achieved      PT LONG TERM GOAL #3   Title report pain < 3/10 with activity for improved function    Status On-going      PT LONG TERM GOAL #4   Title FOTO score improved to 61 for improved function    Status On-going      PT LONG TERM GOAL #5   Title demonstrate at least 3/5 Lt shoulder flex/abduction strength for improved function    Status On-going      Additional Long Term Goals   Additional Long Term Goals Yes      PT LONG TERM GOAL #6   Title Pt will be able to perform sit to stand with UE support independently with pain </= 3/10.    Time 6    Period Weeks    Status New    Target Date 08/28/21      PT LONG TERM GOAL #7   Title Pt will improve her right knee strength  to >/=4/5.    Time 6    Period Weeks    Status New    Target Date 08/28/21                   Plan - 07/30/21 1552     Clinical Impression Statement Pt tolerated session well today and feel she  should be alble to do hair with min A from husband.  Will continue to benefit from PT to maximize function.    Personal Factors and Comorbidities Comorbidity 3+    Comorbidities anxiety, breast cancer with lumpectomy and radioactive seed placement 2021 and recent port removal, chronic pain, concussion 2008, HTN, depression, spinal fusion 2012    Examination-Activity Limitations Locomotion Level;Reach Overhead;Dressing;Hygiene/Grooming;Lift;Toileting;Bathing    Examination-Participation Restrictions Cleaning;Meal Prep;Community Activity;Driving;Laundry    Stability/Clinical Decision Making Evolving/Moderate complexity    Rehab Potential Fair    PT Frequency 2x / week    PT Duration 6 weeks    PT Treatment/Interventions ADLs/Self Care Home Management;Therapeutic exercise;Patient/family education;Cryotherapy;DME Instruction;Ultrasound;Moist Heat;Functional mobility training;Therapeutic activities;Balance training;Neuromuscular re-education;Manual techniques;Vasopneumatic Device;Taping;Dry needling;Passive range of motion;Electrical Stimulation    PT Next Visit Plan continue to progress shoulder ROM/strengthening, NWB with knee scooter or w/c at this time    PT Home Exercise Plan Access Code: [email or web address], added AROM flexion and abd against gravity in sitting and ER with red band    Consulted and Agree with Plan of Care Patient             Patient will benefit from skilled therapeutic intervention in order to improve the following deficits and impairments:  Postural dysfunction, Decreased range of motion, Decreased knowledge of precautions, Increased edema, Impaired UE functional use, Decreased strength, Decreased mobility, Decreased balance, Pain, Difficulty walking, Decreased activity tolerance  Visit Diagnosis: Acute pain of left shoulder  Stiffness of left shoulder, not elsewhere classified  Abnormal posture  Localized edema  Muscle weakness (generalized)  Difficulty in walking, not  elsewhere classified  Acute pain of right knee     Problem List Patient Active Problem List   Diagnosis Date Noted   Arthritis of left shoulder region    S/P reverse total shoulder arthroplasty, 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   Vitamin D deficiency 01/15/2021   Vitamin B12 deficiency 01/15/2021   Primary insomnia 01/15/2021   Morbid obesity ([place]) 01/15/2021   Moderate recurrent major depression ([place]) 01/15/2021   Benzodiazepine dependence ([place]) 01/15/2021   Port-A-Cath in place 07/22/2020   Left leg cellulitis 04/17/2020   Cellulitis of left leg 04/17/2020   Hypokalemia 04/17/2020   Macrocytic anemia 04/17/2020   Anxiety 04/17/2020   Depression 04/17/2020   Chronic diarrhea 04/17/2020   Chemotherapy induced diarrhea 04/17/2020   Malignant neoplasm of upper-inner quadrant of right breast in female, estrogen receptor positive ([place]) 12/26/2019   Encephalopathy 02/02/2018   Hypothyroidism 02/02/2018   AKI (acute kidney injury) ([place]) 02/02/2018   Chronic back pain 02/02/2018   Alcohol abuse 02/02/2018   Weight gain 02/02/2018   Benign essential HTN 02/02/2018      Laureen Abrahams, PT, DPT 07/30/21 3:56 PM     [facility] [address] [place], [place], [address] Phone: [phone]   Fax:  [phone]  Name: ARIEONNA MEDINE MRN: [id number] Date of Birth: 1951/12/20

## 2021-08-04 ENCOUNTER — Other Ambulatory Visit: Payer: Self-pay

## 2021-08-04 ENCOUNTER — Ambulatory Visit (INDEPENDENT_AMBULATORY_CARE_PROVIDER_SITE_OTHER): Payer: Medicare PPO | Admitting: Physical Therapy

## 2021-08-04 DIAGNOSIS — M25512 Pain in left shoulder: Secondary | ICD-10-CM | POA: Diagnosis not present

## 2021-08-04 DIAGNOSIS — R600 Localized edema: Secondary | ICD-10-CM

## 2021-08-04 DIAGNOSIS — R262 Difficulty in walking, not elsewhere classified: Secondary | ICD-10-CM | POA: Diagnosis not present

## 2021-08-04 DIAGNOSIS — R293 Abnormal posture: Secondary | ICD-10-CM | POA: Diagnosis not present

## 2021-08-04 DIAGNOSIS — M6281 Muscle weakness (generalized): Secondary | ICD-10-CM

## 2021-08-04 DIAGNOSIS — M25612 Stiffness of left shoulder, not elsewhere classified: Secondary | ICD-10-CM | POA: Diagnosis not present

## 2021-08-04 DIAGNOSIS — M25561 Pain in right knee: Secondary | ICD-10-CM | POA: Diagnosis not present

## 2021-08-04 NOTE — Therapy (Signed)
[facility] Physical Therapy [address] [place], [place], [address] Phone: [phone]   Fax:  [phone]  Physical Therapy Treatment  Patient Details  Name: [name] MRN: [id number] Date of Birth: August 13, 1951 Referring Provider (PT): Donella Stade, [place]   Encounter Date: 08/04/2021   PT End of Session - 08/04/21 1530     Visit Number 26    Number of Visits 32    Date for PT Re-Evaluation 08/28/21    Authorization Type [employer]    Authorization Time Period 7/27-9/27    Authorization - Visit Number 6    Authorization - Number of Visits 12    Progress Note Due on Visit 30    PT Start Time 1433    PT Stop Time 1521    PT Time Calculation (min) 48 min    Activity Tolerance Patient tolerated treatment well    Behavior During Therapy [facility] for tasks assessed/performed             Past Medical History:  Diagnosis Date   Anxiety    Atrial fibrillation [facility])    sees Dr. Audie Box   Cancer [facility])    breast   Chronic pain    Concussion 02/2007   ICU x 3 days   Depression    Hypertension    Hypothyroidism    Personal history of chemotherapy    Personal history of radiation therapy    Spinal stenosis    Thyroid disease ?1994    Past Surgical History:  Procedure Laterality Date   BREAST BIOPSY Right 12/20/2019   x2   BREAST LUMPECTOMY Right 01/22/2020   BREAST LUMPECTOMY WITH RADIOACTIVE SEED AND SENTINEL LYMPH NODE BIOPSY Right 01/22/2020   Procedure: RIGHT BREAST LUMPECTOMY WITH RADIOACTIVE SEED X2 AND RIGHT SENTINEL LYMPH NODE MAPPING;  Surgeon: Erroll Luna, MD;  Location: [place];  Service: General;  Laterality: Right;   BREAST SURGERY Right 03/1998   breast biopsy, benign   EYE SURGERY Right    PORT-A-CATH REMOVAL Right 04/30/2021   Procedure: REMOVAL PORT-A-CATH;  Surgeon: Erroll Luna, MD;  Location: [place];  Service: General;  Laterality: Right;   PORTACATH PLACEMENT Right 01/22/2020    Procedure: INSERTION PORT-A-CATH WITH ULTRASOUND;  Surgeon: Erroll Luna, MD;  Location: [place];  Service: General;  Laterality: Right;   PORTACATH PLACEMENT Right 02/28/2020   Procedure: PORT A CATH REVISION;  Surgeon: Erroll Luna, MD;  Location: [place];  Service: General;  Laterality: Right;   REVERSE SHOULDER ARTHROPLASTY Left 02/02/2021   Procedure: LEFT REVERSE SHOULDER ARTHROPLASTY;  Surgeon: Meredith Pel, MD;  Location: [place];  Service: Orthopedics;  Laterality: Left;   SHOULDER SURGERY Right    SPINAL FUSION  03/04/2011   with ORIF    There were no vitals filed for this visit.   Subjective Assessment - 08/04/21 1527     Subjective She is not having much pain in her left shoulder but is concerned with the weakness, her Rt knee is very painful today but does not provide rating.    Pertinent History anxiety, breast cancer with lumpectomy and radioactive seed placement 2021 and recent port removal, chronic pain, concussion 2008, HTN, depression, spinal fusion 2012.    Patient Stated Goals improve function of Lt arm, raise shoulder over her head to do her hair    Pain Onset 1 to 4 weeks ago    Pain Onset More than a month ago  [facility] Adult PT Treatment/Exercise - 08/04/21 0001       Shoulder Exercises: Seated   Extension Both;20 reps    Theraband Level (Shoulder Extension) Level 4 (Blue)    Row Both;Theraband;20 reps    Theraband Level (Shoulder Row) Level 4 (Blue)    External Rotation Left;20 reps;Theraband    Theraband Level (Shoulder External Rotation) Level 2 (Red)    Flexion Left;15 reps;AROM    Abduction Left;15 reps;AROM    Other Seated Exercises sustained abduction then flexion hold 30 sec x 3 on Lt    Other Seated Exercises UBE 3 min fwd, 3 min reverse L2.  D1 and D2 flexion X 10 ea on Lt      Shoulder Exercises: Pulleys   Flexion 3 minutes    ABduction 3 minutes      Vasopneumatic    Number Minutes Vasopneumatic  10 minutes    Vasopnuematic Location  Knee;Shoulder    Vasopneumatic Pressure Low    Vasopneumatic Temperature  34      Manual Therapy   Manual therapy comments PROM left shoulder into flexion, abd, ER                      PT Short Term Goals - 07/07/21 1420       PT SHORT TERM GOAL #1   Title independent with intial HEP    Status Achieved      PT SHORT TERM GOAL #2   Title improve Lt shoulder passive ER to 30 deg    Status Achieved               PT Long Term Goals - 07/14/21 1633       PT LONG TERM GOAL #1   Title independent with final HEP    Status On-going      PT LONG TERM GOAL #2   Title Lt shoulder active flexion and abduction improved to at least 90 deg in order to perform ADLs    Baseline met    Status Achieved      PT LONG TERM GOAL #3   Title report pain < 3/10 with activity for improved function    Status On-going      PT LONG TERM GOAL #4   Title FOTO score improved to 61 for improved function    Status On-going      PT LONG TERM GOAL #5   Title demonstrate at least 3/5 Lt shoulder flex/abduction strength for improved function    Status On-going      Additional Long Term Goals   Additional Long Term Goals Yes      PT LONG TERM GOAL #6   Title Pt will be able to perform sit to stand with UE support independently with pain </= 3/10.    Time 6    Period Weeks    Status New    Target Date 08/28/21      PT LONG TERM GOAL #7   Title Pt will improve her right knee strength to >/=4/5.    Time 6    Period Weeks    Status New    Target Date 08/28/21                   Plan - 08/04/21 1531     Clinical Impression Statement She had good tolerance to session but continues to lack strength for funcitonal activity OH. Her ROM is doing well however and she is making progress with  PT even though it is slow, recommending to continue POC.    Personal Factors and Comorbidities Comorbidity 3+     Comorbidities anxiety, breast cancer with lumpectomy and radioactive seed placement 2021 and recent port removal, chronic pain, concussion 2008, HTN, depression, spinal fusion 2012    Examination-Activity Limitations Locomotion Level;Reach Overhead;Dressing;Hygiene/Grooming;Lift;Toileting;Bathing    Examination-Participation Restrictions Cleaning;Meal Prep;Community Activity;Driving;Laundry    Stability/Clinical Decision Making Evolving/Moderate complexity    Rehab Potential Fair    PT Frequency 2x / week    PT Duration 6 weeks    PT Treatment/Interventions ADLs/Self Care Home Management;Therapeutic exercise;Patient/family education;Cryotherapy;DME Instruction;Ultrasound;Moist Heat;Functional mobility training;Therapeutic activities;Balance training;Neuromuscular re-education;Manual techniques;Vasopneumatic Device;Taping;Dry needling;Passive range of motion;Electrical Stimulation    PT Next Visit Plan continue to progress shoulder ROM/strengthening, NWB with knee scooter or w/c at this time    PT Home Exercise Plan Access Code: [email or web address], added AROM flexion and abd against gravity in sitting and ER with red band    Consulted and Agree with Plan of Care Patient             Patient will benefit from skilled therapeutic intervention in order to improve the following deficits and impairments:  Postural dysfunction, Decreased range of motion, Decreased knowledge of precautions, Increased edema, Impaired UE functional use, Decreased strength, Decreased mobility, Decreased balance, Pain, Difficulty walking, Decreased activity tolerance  Visit Diagnosis: Acute pain of left shoulder  Stiffness of left shoulder, not elsewhere classified  Abnormal posture  Localized edema  Muscle weakness (generalized)  Difficulty in walking, not elsewhere classified  Acute pain of right knee     Problem List Patient Active Problem List   Diagnosis Date Noted   Arthritis of left shoulder region    S/P  reverse total shoulder arthroplasty, 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   Vitamin D deficiency 01/15/2021   Vitamin B12 deficiency 01/15/2021   Primary insomnia 01/15/2021   Morbid obesity ([place]) 01/15/2021   Moderate recurrent major depression ([place]) 01/15/2021   Benzodiazepine dependence ([place]) 01/15/2021   Port-A-Cath in place 07/22/2020   Left leg cellulitis 04/17/2020   Cellulitis of left leg 04/17/2020   Hypokalemia 04/17/2020   Macrocytic anemia 04/17/2020   Anxiety 04/17/2020   Depression 04/17/2020   Chronic diarrhea 04/17/2020   Chemotherapy induced diarrhea 04/17/2020   Malignant neoplasm of upper-inner quadrant of right breast in female, estrogen receptor positive ([place]) 12/26/2019   Encephalopathy 02/02/2018   Hypothyroidism 02/02/2018   AKI (acute kidney injury) ([place]) 02/02/2018   Chronic back pain 02/02/2018   Alcohol abuse 02/02/2018   Weight gain 02/02/2018   Benign essential HTN 02/02/2018    Silvestre Mesi 08/04/2021, 3:32 PM  [facility] Physical Therapy [address] [place], [place], [address] Phone: [phone]   Fax:  [phone]  Name: [name] MRN: [id number] Date of Birth: 12/14/1951

## 2021-08-05 ENCOUNTER — Other Ambulatory Visit: Payer: Self-pay | Admitting: Orthopedic Surgery

## 2021-08-05 DIAGNOSIS — M25561 Pain in right knee: Secondary | ICD-10-CM

## 2021-08-05 NOTE — Telephone Encounter (Signed)
Last refilled 06/17/2021 Qty: 30 tablets Refills: 1

## 2021-08-06 ENCOUNTER — Ambulatory Visit (INDEPENDENT_AMBULATORY_CARE_PROVIDER_SITE_OTHER): Payer: Medicare PPO | Admitting: Physical Therapy

## 2021-08-06 ENCOUNTER — Other Ambulatory Visit: Payer: Self-pay

## 2021-08-06 DIAGNOSIS — M6281 Muscle weakness (generalized): Secondary | ICD-10-CM | POA: Diagnosis not present

## 2021-08-06 DIAGNOSIS — R293 Abnormal posture: Secondary | ICD-10-CM | POA: Diagnosis not present

## 2021-08-06 DIAGNOSIS — M25561 Pain in right knee: Secondary | ICD-10-CM

## 2021-08-06 DIAGNOSIS — M25612 Stiffness of left shoulder, not elsewhere classified: Secondary | ICD-10-CM

## 2021-08-06 DIAGNOSIS — M25512 Pain in left shoulder: Secondary | ICD-10-CM

## 2021-08-06 DIAGNOSIS — R600 Localized edema: Secondary | ICD-10-CM | POA: Diagnosis not present

## 2021-08-06 DIAGNOSIS — R262 Difficulty in walking, not elsewhere classified: Secondary | ICD-10-CM | POA: Diagnosis not present

## 2021-08-06 NOTE — Therapy (Signed)
[facility] Physical Therapy [address] [place], [place], [address] Phone: [phone]   Fax:  [phone]  Physical Therapy Treatment  Patient Details  Name: [name] MRN: [id number] Date of Birth: [birth year] Referring Provider (PT): Donella Stade, [place]   Encounter Date: 08/06/2021   PT End of Session - 08/06/21 1228     Visit Number 27    Number of Visits 32    Date for PT Re-Evaluation 08/28/21    Authorization Type [employer]    Authorization Time Period 7/27-9/27    Authorization - Visit Number 7    Authorization - Number of Visits 12    Progress Note Due on Visit 30    PT Start Time 7989    PT Stop Time 1235    PT Time Calculation (min) 50 min    Activity Tolerance Patient tolerated treatment well    Behavior During Therapy [facility] for tasks assessed/performed             Past Medical History:  Diagnosis Date   Anxiety    Atrial fibrillation [facility])    sees Dr. Audie Box   Cancer [facility])    breast   Chronic pain    Concussion 02/2007   ICU x 3 days   Depression    Hypertension    Hypothyroidism    Personal history of chemotherapy    Personal history of radiation therapy    Spinal stenosis    Thyroid disease ?1994    Past Surgical History:  Procedure Laterality Date   BREAST BIOPSY Right 12/20/2019   x2   BREAST LUMPECTOMY Right 01/22/2020   BREAST LUMPECTOMY WITH RADIOACTIVE SEED AND SENTINEL LYMPH NODE BIOPSY Right 01/22/2020   Procedure: RIGHT BREAST LUMPECTOMY WITH RADIOACTIVE SEED X2 AND RIGHT SENTINEL LYMPH NODE MAPPING;  Surgeon: Erroll Luna, MD;  Location: [place];  Service: General;  Laterality: Right;   BREAST SURGERY Right 03/1998   breast biopsy, benign   EYE SURGERY Right    PORT-A-CATH REMOVAL Right 04/30/2021   Procedure: REMOVAL PORT-A-CATH;  Surgeon: Erroll Luna, MD;  Location: [place];  Service: General;  Laterality: Right;   PORTACATH PLACEMENT Right 01/22/2020    Procedure: INSERTION PORT-A-CATH WITH ULTRASOUND;  Surgeon: Erroll Luna, MD;  Location: [place];  Service: General;  Laterality: Right;   PORTACATH PLACEMENT Right 02/28/2020   Procedure: PORT A CATH REVISION;  Surgeon: Erroll Luna, MD;  Location: [place];  Service: General;  Laterality: Right;   REVERSE SHOULDER ARTHROPLASTY Left 02/02/2021   Procedure: LEFT REVERSE SHOULDER ARTHROPLASTY;  Surgeon: Meredith Pel, MD;  Location: [place];  Service: Orthopedics;  Laterality: Left;   SHOULDER SURGERY Right    SPINAL FUSION  03/04/2011   with ORIF    There were no vitals filed for this visit.   Subjective Assessment - 08/06/21 1154     Subjective she relays her Rt knee is feeling a little better, her Lt shoulder is feeling good but it is weak    Pertinent History anxiety, breast cancer with lumpectomy and radioactive seed placement 2021 and recent port removal, chronic pain, concussion 2008, HTN, depression, spinal fusion 2012.    Patient Stated Goals improve function of Lt arm, raise shoulder over her head to do her hair    Pain Onset 1 to 4 weeks ago    Pain Onset More than a month ago  [facility] PT Assessment - 08/06/21 0001       Assessment   Medical Diagnosis Z96.612 (ICD-10-CM) - History of arthroplasty of left shoulder    Referring Provider (PT) Magnant, Gerrianne Scale, PA-C    Onset Date/Surgical Date 02/02/21      AROM   Left Shoulder Flexion 135 Degrees    Left Shoulder ABduction 114 Degrees    Left Shoulder External Rotation 50 Degrees      Strength   Left Shoulder Flexion 3+/5    Left Shoulder ABduction 3/5    Left Shoulder Internal Rotation 4+/5    Left Shoulder External Rotation 4/5                           [facility] Adult PT Treatment/Exercise - 08/06/21 0001       Shoulder Exercises: Seated   Extension Both;20 reps    Theraband Level (Shoulder Extension) Level 4 (Blue)    Row Both;Theraband;20 reps     Theraband Level (Shoulder Row) Level 4 (Blue)    External Rotation Left;20 reps;Theraband    Theraband Level (Shoulder External Rotation) Level 2 (Red)    Internal Rotation Left;15 reps    Theraband Level (Shoulder Internal Rotation) Level 4 (Blue)    Flexion Left;15 reps;AROM    Flexion Weight (lbs) 2    Abduction Left;15 reps;AROM    ABduction Limitations 2    Other Seated Exercises sustained abduction then flexion hold 30 sec x 3 on Lt    Other Seated Exercises D1 and D2 flexion X 10 ea on Lt      Shoulder Exercises: Pulleys   Flexion 3 minutes    ABduction 3 minutes      Vasopneumatic   Number Minutes Vasopneumatic  10 minutes    Vasopnuematic Location  Knee;Shoulder    Vasopneumatic Pressure Low    Vasopneumatic Temperature  34      Manual Therapy   Manual therapy comments --                      PT Short Term Goals - 07/07/21 1420       PT SHORT TERM GOAL #1   Title independent with intial HEP    Status Achieved      PT SHORT TERM GOAL #2   Title improve Lt shoulder passive ER to 30 deg    Status Achieved               PT Long Term Goals - 07/14/21 1633       PT LONG TERM GOAL #1   Title independent with final HEP    Status On-going      PT LONG TERM GOAL #2   Title Lt shoulder active flexion and abduction improved to at least 90 deg in order to perform ADLs    Baseline met    Status Achieved      PT LONG TERM GOAL #3   Title report pain < 3/10 with activity for improved function    Status On-going      PT LONG TERM GOAL #4   Title FOTO score improved to 61 for improved function    Status On-going      PT LONG TERM GOAL #5   Title demonstrate at least 3/5 Lt shoulder flex/abduction strength for improved function    Status On-going      Additional Long Term Goals   Additional Long Term Goals Yes  PT LONG TERM GOAL #6   Title Pt will be able to perform sit to stand with UE support independently with pain </= 3/10.    Time 6     Period Weeks    Status New    Target Date 08/28/21      PT LONG TERM GOAL #7   Title Pt will improve her right knee strength to >/=4/5.    Time 6    Period Weeks    Status New    Target Date 08/28/21                   Plan - 08/06/21 1229     Clinical Impression Statement Some small improvements in AROM with updated measurements. ROM is more limited by weakness than ROM limitation as her PROM is doing well. We will continue to work to improve her strength and funciton of her Lt shoulder.    Personal Factors and Comorbidities Comorbidity 3+    Comorbidities anxiety, breast cancer with lumpectomy and radioactive seed placement 2021 and recent port removal, chronic pain, concussion 2008, HTN, depression, spinal fusion 2012    Examination-Activity Limitations Locomotion Level;Reach Overhead;Dressing;Hygiene/Grooming;Lift;Toileting;Bathing    Examination-Participation Restrictions Cleaning;Meal Prep;Community Activity;Driving;Laundry    Stability/Clinical Decision Making Evolving/Moderate complexity    Rehab Potential Fair    PT Frequency 2x / week    PT Duration 6 weeks    PT Treatment/Interventions ADLs/Self Care Home Management;Therapeutic exercise;Patient/family education;Cryotherapy;DME Instruction;Ultrasound;Moist Heat;Functional mobility training;Therapeutic activities;Balance training;Neuromuscular re-education;Manual techniques;Vasopneumatic Device;Taping;Dry needling;Passive range of motion;Electrical Stimulation    PT Next Visit Plan continue to progress shoulder ROM/strengthening, NWB with knee scooter or w/c at this time    PT Home Exercise Plan Access Code: [email or web address], added AROM flexion and abd against gravity in sitting and ER with red band    Consulted and Agree with Plan of Care Patient             Patient will benefit from skilled therapeutic intervention in order to improve the following deficits and impairments:  Postural dysfunction, Decreased range of  motion, Decreased knowledge of precautions, Increased edema, Impaired UE functional use, Decreased strength, Decreased mobility, Decreased balance, Pain, Difficulty walking, Decreased activity tolerance  Visit Diagnosis: Acute pain of left shoulder  Stiffness of left shoulder, not elsewhere classified  Abnormal posture  Localized edema  Muscle weakness (generalized)  Difficulty in walking, not elsewhere classified  Acute pain of right knee     Problem List Patient Active Problem List   Diagnosis Date Noted   Arthritis of left shoulder region    S/P reverse total shoulder arthroplasty, 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   Vitamin D deficiency 01/15/2021   Vitamin B12 deficiency 01/15/2021   Primary insomnia 01/15/2021   Morbid obesity ([place]) 01/15/2021   Moderate recurrent major depression ([place]) 01/15/2021   Benzodiazepine dependence ([place]) 01/15/2021   Port-A-Cath in place 07/22/2020   Left leg cellulitis 04/17/2020   Cellulitis of left leg 04/17/2020   Hypokalemia 04/17/2020   Macrocytic anemia 04/17/2020   Anxiety 04/17/2020   Depression 04/17/2020   Chronic diarrhea 04/17/2020   Chemotherapy induced diarrhea 04/17/2020   Malignant neoplasm of upper-inner quadrant of right breast in female, estrogen receptor positive ([place]) 12/26/2019   Encephalopathy 02/02/2018   Hypothyroidism 02/02/2018   AKI (acute kidney injury) ([place]) 02/02/2018   Chronic back pain 02/02/2018   Alcohol abuse 02/02/2018   Weight gain 02/02/2018   Benign essential HTN 02/02/2018    Marrianne Mood Chantil Bari,DPT,PT 08/06/2021, 12:31 PM  [facility]  Physical Therapy [address] [place], [place], [address] Phone: [phone]   Fax:  [phone]  Name: [name] MRN: [id number] Date of Birth: 07-Dec-1951

## 2021-08-11 ENCOUNTER — Other Ambulatory Visit: Payer: Self-pay

## 2021-08-11 ENCOUNTER — Ambulatory Visit (INDEPENDENT_AMBULATORY_CARE_PROVIDER_SITE_OTHER): Payer: Medicare PPO | Admitting: Physical Therapy

## 2021-08-11 DIAGNOSIS — M25512 Pain in left shoulder: Secondary | ICD-10-CM | POA: Diagnosis not present

## 2021-08-11 DIAGNOSIS — M25612 Stiffness of left shoulder, not elsewhere classified: Secondary | ICD-10-CM

## 2021-08-11 DIAGNOSIS — R293 Abnormal posture: Secondary | ICD-10-CM | POA: Diagnosis not present

## 2021-08-11 DIAGNOSIS — R600 Localized edema: Secondary | ICD-10-CM

## 2021-08-11 NOTE — Therapy (Signed)
[facility] [address] [place], [place], [address] Phone: [phone]   Fax:  [phone]  Physical Therapy Treatment  Patient Details  Name: [name] MRN: [id number] Date of Birth: 31-Jan-1951 Referring Provider (PT): Donella Stade, [place]   Encounter Date: 08/11/2021   PT End of Session - 08/11/21 1427     Visit Number 28    Number of Visits 32    Date for PT Re-Evaluation 08/28/21    Authorization Type [employer]    Authorization Time Period 7/27-9/27    Authorization - Visit Number 8    Authorization - Number of Visits 12    Progress Note Due on Visit 30    PT Start Time 1346    PT Stop Time 1434    PT Time Calculation (min) 48 min    Activity Tolerance Patient tolerated treatment well    Behavior During Therapy [facility] for tasks assessed/performed             Past Medical History:  Diagnosis Date   Anxiety    Atrial fibrillation [facility])    sees Dr. Audie Box   Cancer [facility])    breast   Chronic pain    Concussion 02/2007   ICU x 3 days   Depression    Hypertension    Hypothyroidism    Personal history of chemotherapy    Personal history of radiation therapy    Spinal stenosis    Thyroid disease ?1994    Past Surgical History:  Procedure Laterality Date   BREAST BIOPSY Right 12/20/2019   x2   BREAST LUMPECTOMY Right 01/22/2020   BREAST LUMPECTOMY WITH RADIOACTIVE SEED AND SENTINEL LYMPH NODE BIOPSY Right 01/22/2020   Procedure: RIGHT BREAST LUMPECTOMY WITH RADIOACTIVE SEED X2 AND RIGHT SENTINEL LYMPH NODE MAPPING;  Surgeon: Erroll Luna, MD;  Location: [place];  Service: General;  Laterality: Right;   BREAST SURGERY Right 03/1998   breast biopsy, benign   EYE SURGERY Right    PORT-A-CATH REMOVAL Right 04/30/2021   Procedure: REMOVAL PORT-A-CATH;  Surgeon: Erroll Luna, MD;  Location: [place];  Service: General;  Laterality: Right;   PORTACATH PLACEMENT Right 01/22/2020    Procedure: INSERTION PORT-A-CATH WITH ULTRASOUND;  Surgeon: Erroll Luna, MD;  Location: [place];  Service: General;  Laterality: Right;   PORTACATH PLACEMENT Right 02/28/2020   Procedure: PORT A CATH REVISION;  Surgeon: Erroll Luna, MD;  Location: [place];  Service: General;  Laterality: Right;   REVERSE SHOULDER ARTHROPLASTY Left 02/02/2021   Procedure: LEFT REVERSE SHOULDER ARTHROPLASTY;  Surgeon: Meredith Pel, MD;  Location: [place];  Service: Orthopedics;  Laterality: Left;   SHOULDER SURGERY Right    SPINAL FUSION  03/04/2011   with ORIF    There were no vitals filed for this visit.   Subjective Assessment - 08/11/21 1404     Subjective she relays her Rt knee has less pain overall, Her left shoulder is not having any pain today upon arrival.    Pertinent History anxiety, breast cancer with lumpectomy and radioactive seed placement 2021 and recent port removal, chronic pain, concussion 2008, HTN, depression, spinal fusion 2012.    Patient Stated Goals improve function of Lt arm, raise shoulder over her head to do her hair    Pain Onset 1 to 4 weeks ago    Pain Onset More than a month ago  [facility] Adult PT Treatment/Exercise - 08/11/21 0001       Shoulder Exercises: Seated   Extension Both;20 reps    Theraband Level (Shoulder Extension) Level 4 (Blue)    Row Both;Theraband;20 reps    Theraband Level (Shoulder Row) Level 4 (Blue)    External Rotation Left;20 reps;Theraband    Theraband Level (Shoulder External Rotation) Level 2 (Red)    Flexion Left;Strengthening;12 reps    Flexion Weight (lbs) 1    Abduction Strengthening;Left;15 reps    ABduction Weight (lbs) 1    Other Seated Exercises shoulder ER reaching behind her head 5 sec holds X10    Other Seated Exercises 3# bar for shoulder flexion OH X 15, D1 and D2 flexion X 15 ea on Lt, chest press with red X 15      Vasopneumatic   Number Minutes  Vasopneumatic  10 minutes    Vasopnuematic Location  Knee;Shoulder    Vasopneumatic Pressure Low    Vasopneumatic Temperature  34                      PT Short Term Goals - 07/07/21 1420       PT SHORT TERM GOAL #1   Title independent with intial HEP    Status Achieved      PT SHORT TERM GOAL #2   Title improve Lt shoulder passive ER to 30 deg    Status Achieved               PT Long Term Goals - 07/14/21 1633       PT LONG TERM GOAL #1   Title independent with final HEP    Status On-going      PT LONG TERM GOAL #2   Title Lt shoulder active flexion and abduction improved to at least 90 deg in order to perform ADLs    Baseline met    Status Achieved      PT LONG TERM GOAL #3   Title report pain < 3/10 with activity for improved function    Status On-going      PT LONG TERM GOAL #4   Title FOTO score improved to 61 for improved function    Status On-going      PT LONG TERM GOAL #5   Title demonstrate at least 3/5 Lt shoulder flex/abduction strength for improved function    Status On-going      Additional Long Term Goals   Additional Long Term Goals Yes      PT LONG TERM GOAL #6   Title Pt will be able to perform sit to stand with UE support independently with pain </= 3/10.    Time 6    Period Weeks    Status New    Target Date 08/28/21      PT LONG TERM GOAL #7   Title Pt will improve her right knee strength to >/=4/5.    Time 6    Period Weeks    Status New    Target Date 08/28/21                   Plan - 08/11/21 1432     Clinical Impression Statement She was able to progress her strength today and handle more resistance with Lt shoulder but still very limited overall. This is slowly improving with PT and we will continue to work on this as tolerated.    Personal Factors and Comorbidities Comorbidity 3+  Comorbidities anxiety, breast cancer with lumpectomy and radioactive seed placement 2021 and recent port removal,  chronic pain, concussion 2008, HTN, depression, spinal fusion 2012    Examination-Activity Limitations Locomotion Level;Reach Overhead;Dressing;Hygiene/Grooming;Lift;Toileting;Bathing    Examination-Participation Restrictions Cleaning;Meal Prep;Community Activity;Driving;Laundry    Stability/Clinical Decision Making Evolving/Moderate complexity    Rehab Potential Fair    PT Frequency 2x / week    PT Duration 6 weeks    PT Treatment/Interventions ADLs/Self Care Home Management;Therapeutic exercise;Patient/family education;Cryotherapy;DME Instruction;Ultrasound;Moist Heat;Functional mobility training;Therapeutic activities;Balance training;Neuromuscular re-education;Manual techniques;Vasopneumatic Device;Taping;Dry needling;Passive range of motion;Electrical Stimulation    PT Next Visit Plan continue to progress shoulder ROM/strengthening, NWB with knee scooter or w/c at this time    PT Home Exercise Plan Access Code: [email or web address], added AROM flexion and abd against gravity in sitting and ER with red band    Consulted and Agree with Plan of Care Patient             Patient will benefit from skilled therapeutic intervention in order to improve the following deficits and impairments:  Postural dysfunction, Decreased range of motion, Decreased knowledge of precautions, Increased edema, Impaired UE functional use, Decreased strength, Decreased mobility, Decreased balance, Pain, Difficulty walking, Decreased activity tolerance  Visit Diagnosis: Acute pain of left shoulder  Stiffness of left shoulder, not elsewhere classified  Abnormal posture  Localized edema     Problem List Patient Active Problem List   Diagnosis Date Noted   Arthritis of left shoulder region    S/P reverse total shoulder arthroplasty, 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   Vitamin D deficiency 01/15/2021   Vitamin B12 deficiency 01/15/2021   Primary insomnia 01/15/2021   Morbid obesity ([place]) 01/15/2021   Moderate recurrent major  depression ([place]) 01/15/2021   Benzodiazepine dependence ([place]) 01/15/2021   Port-A-Cath in place 07/22/2020   Left leg cellulitis 04/17/2020   Cellulitis of left leg 04/17/2020   Hypokalemia 04/17/2020   Macrocytic anemia 04/17/2020   Anxiety 04/17/2020   Depression 04/17/2020   Chronic diarrhea 04/17/2020   Chemotherapy induced diarrhea 04/17/2020   Malignant neoplasm of upper-inner quadrant of right breast in female, estrogen receptor positive ([place]) 12/26/2019   Encephalopathy 02/02/2018   Hypothyroidism 02/02/2018   AKI (acute kidney injury) ([place]) 02/02/2018   Chronic back pain 02/02/2018   Alcohol abuse 02/02/2018   Weight gain 02/02/2018   Benign essential HTN 02/02/2018    Silvestre Mesi 08/11/2021, 2:33 PM  [facility] Therapy [address] [place], [place], [address] Phone: [phone]   Fax:  [phone]  Name: EDIN SKARDA MRN: [id number] Date of Birth: Apr 23, 1951

## 2021-08-13 ENCOUNTER — Other Ambulatory Visit: Payer: Self-pay

## 2021-08-13 ENCOUNTER — Encounter: Payer: Self-pay | Admitting: Physical Therapy

## 2021-08-13 ENCOUNTER — Ambulatory Visit (INDEPENDENT_AMBULATORY_CARE_PROVIDER_SITE_OTHER): Payer: Medicare PPO | Admitting: Physical Therapy

## 2021-08-13 DIAGNOSIS — M6281 Muscle weakness (generalized): Secondary | ICD-10-CM | POA: Diagnosis not present

## 2021-08-13 DIAGNOSIS — R600 Localized edema: Secondary | ICD-10-CM

## 2021-08-13 DIAGNOSIS — M25512 Pain in left shoulder: Secondary | ICD-10-CM | POA: Diagnosis not present

## 2021-08-13 DIAGNOSIS — R293 Abnormal posture: Secondary | ICD-10-CM

## 2021-08-13 DIAGNOSIS — R262 Difficulty in walking, not elsewhere classified: Secondary | ICD-10-CM

## 2021-08-13 DIAGNOSIS — M25612 Stiffness of left shoulder, not elsewhere classified: Secondary | ICD-10-CM | POA: Diagnosis not present

## 2021-08-13 DIAGNOSIS — M25561 Pain in right knee: Secondary | ICD-10-CM | POA: Diagnosis not present

## 2021-08-13 NOTE — Therapy (Signed)
[facility] [address] [place], [place], [address] Phone: [phone]   Fax:  [phone]  Physical Therapy Treatment  Patient Details  Name: [name] MRN: [id number] Date of Birth: December 17, 1951 Referring Provider (PT): Donella Stade, [place]   Encounter Date: 08/13/2021   PT End of Session - 08/13/21 1429     Visit Number 29    Number of Visits 32    Date for PT Re-Evaluation 08/28/21    Authorization Type [employer]    Authorization Time Period 7/27-9/27    Authorization - Visit Number 9    Authorization - Number of Visits 12    Progress Note Due on Visit 30    PT Start Time 1345    PT Stop Time 1434    PT Time Calculation (min) 49 min    Activity Tolerance Patient tolerated treatment well    Behavior During Therapy [facility] for tasks assessed/performed             Past Medical History:  Diagnosis Date   Anxiety    Atrial fibrillation [facility])    sees Dr. Audie Box   Cancer [facility])    breast   Chronic pain    Concussion 02/2007   ICU x 3 days   Depression    Hypertension    Hypothyroidism    Personal history of chemotherapy    Personal history of radiation therapy    Spinal stenosis    Thyroid disease ?1994    Past Surgical History:  Procedure Laterality Date   BREAST BIOPSY Right 12/20/2019   x2   BREAST LUMPECTOMY Right 01/22/2020   BREAST LUMPECTOMY WITH RADIOACTIVE SEED AND SENTINEL LYMPH NODE BIOPSY Right 01/22/2020   Procedure: RIGHT BREAST LUMPECTOMY WITH RADIOACTIVE SEED X2 AND RIGHT SENTINEL LYMPH NODE MAPPING;  Surgeon: Erroll Luna, MD;  Location: [place];  Service: General;  Laterality: Right;   BREAST SURGERY Right 03/1998   breast biopsy, benign   EYE SURGERY Right    PORT-A-CATH REMOVAL Right 04/30/2021   Procedure: REMOVAL PORT-A-CATH;  Surgeon: Erroll Luna, MD;  Location: [place];  Service: General;  Laterality: Right;   PORTACATH PLACEMENT Right 01/22/2020    Procedure: INSERTION PORT-A-CATH WITH ULTRASOUND;  Surgeon: Erroll Luna, MD;  Location: [place];  Service: General;  Laterality: Right;   PORTACATH PLACEMENT Right 02/28/2020   Procedure: PORT A CATH REVISION;  Surgeon: Erroll Luna, MD;  Location: [place];  Service: General;  Laterality: Right;   REVERSE SHOULDER ARTHROPLASTY Left 02/02/2021   Procedure: LEFT REVERSE SHOULDER ARTHROPLASTY;  Surgeon: Meredith Pel, MD;  Location: [place];  Service: Orthopedics;  Laterality: Left;   SHOULDER SURGERY Right    SPINAL FUSION  03/04/2011   with ORIF    There were no vitals filed for this visit.       [facility] PT Assessment - 08/13/21 0001       Assessment   Medical Diagnosis Z96.612 (ICD-10-CM) - History of arthroplasty of left shoulder    Referring Provider (PT) Magnant, Gerrianne Scale, PA-C    Onset Date/Surgical Date 02/02/21      AROM   Left Shoulder Flexion 135 Degrees   measured at end of session                          [facility] Adult PT Treatment/Exercise - 08/13/21 0001       Shoulder Exercises: Seated   Extension Both;20  reps    Theraband Level (Shoulder Extension) Level 4 (Blue)    Row Both;Theraband;20 reps    Theraband Level (Shoulder Row) Level 4 (Blue)    External Rotation Left;20 reps;Theraband    Theraband Level (Shoulder External Rotation) Level 2 (Red)    Flexion Left;Strengthening    Flexion Weight (lbs) 1    Flexion Limitations 2X10    ABduction Weight (lbs) 1    ABduction Limitations 2X10    Other Seated Exercises shoulder ER reaching behind her head 5 sec holds 2X10    Other Seated Exercises Horizontal abd red 2X10, 3# bar for shoulder flexion OH bent arms X 10 and shoulder flexion straight arms X10      Shoulder Exercises: Pulleys   Flexion 3 minutes    ABduction 3 minutes      Vasopneumatic   Number Minutes Vasopneumatic  10 minutes    Vasopnuematic Location  Shoulder    Vasopneumatic Pressure Low     Vasopneumatic Temperature  34                      PT Short Term Goals - 07/07/21 1420       PT SHORT TERM GOAL #1   Title independent with intial HEP    Status Achieved      PT SHORT TERM GOAL #2   Title improve Lt shoulder passive ER to 30 deg    Status Achieved               PT Long Term Goals - 07/14/21 1633       PT LONG TERM GOAL #1   Title independent with final HEP    Status On-going      PT LONG TERM GOAL #2   Title Lt shoulder active flexion and abduction improved to at least 90 deg in order to perform ADLs    Baseline met    Status Achieved      PT LONG TERM GOAL #3   Title report pain < 3/10 with activity for improved function    Status On-going      PT LONG TERM GOAL #4   Title FOTO score improved to 61 for improved function    Status On-going      PT LONG TERM GOAL #5   Title demonstrate at least 3/5 Lt shoulder flex/abduction strength for improved function    Status On-going      Additional Long Term Goals   Additional Long Term Goals Yes      PT LONG TERM GOAL #6   Title Pt will be able to perform sit to stand with UE support independently with pain </= 3/10.    Time 6    Period Weeks    Status New    Target Date 08/28/21      PT LONG TERM GOAL #7   Title Pt will improve her right knee strength to >/=4/5.    Time 6    Period Weeks    Status New    Target Date 08/28/21                   Plan - 08/13/21 1430     Clinical Impression Statement She appears to have improved her strength and AROM but when measured at end of session this was the same. This may be due to the fact that her arm was too fatigued by end of session so we will plan to measure in the beginning  next vist.    Personal Factors and Comorbidities Comorbidity 3+    Comorbidities anxiety, breast cancer with lumpectomy and radioactive seed placement 2021 and recent port removal, chronic pain, concussion 2008, HTN, depression, spinal fusion 2012     Examination-Activity Limitations Locomotion Level;Reach Overhead;Dressing;Hygiene/Grooming;Lift;Toileting;Bathing    Examination-Participation Restrictions Cleaning;Meal Prep;Community Activity;Driving;Laundry    Stability/Clinical Decision Making Evolving/Moderate complexity    Rehab Potential Fair    PT Frequency 2x / week    PT Duration 6 weeks    PT Treatment/Interventions ADLs/Self Care Home Management;Therapeutic exercise;Patient/family education;Cryotherapy;DME Instruction;Ultrasound;Moist Heat;Functional mobility training;Therapeutic activities;Balance training;Neuromuscular re-education;Manual techniques;Vasopneumatic Device;Taping;Dry needling;Passive range of motion;Electrical Stimulation    PT Next Visit Plan continue to progress shoulder ROM/strengthening, NWB with knee scooter or w/c at this time    PT Home Exercise Plan Access Code: [email or web address], added AROM flexion and abd against gravity in sitting and ER with red band    Consulted and Agree with Plan of Care Patient             Patient will benefit from skilled therapeutic intervention in order to improve the following deficits and impairments:  Postural dysfunction, Decreased range of motion, Decreased knowledge of precautions, Increased edema, Impaired UE functional use, Decreased strength, Decreased mobility, Decreased balance, Pain, Difficulty walking, Decreased activity tolerance  Visit Diagnosis: Acute pain of left shoulder  Stiffness of left shoulder, not elsewhere classified  Abnormal posture  Localized edema  Muscle weakness (generalized)  Difficulty in walking, not elsewhere classified  Acute pain of right knee     Problem List Patient Active Problem List   Diagnosis Date Noted   Arthritis of left shoulder region    S/P reverse total shoulder arthroplasty, 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   Vitamin D deficiency 01/15/2021   Vitamin B12 deficiency 01/15/2021   Primary insomnia 01/15/2021   Morbid obesity ([place])  01/15/2021   Moderate recurrent major depression ([place]) 01/15/2021   Benzodiazepine dependence ([place]) 01/15/2021   Port-A-Cath in place 07/22/2020   Left leg cellulitis 04/17/2020   Cellulitis of left leg 04/17/2020   Hypokalemia 04/17/2020   Macrocytic anemia 04/17/2020   Anxiety 04/17/2020   Depression 04/17/2020   Chronic diarrhea 04/17/2020   Chemotherapy induced diarrhea 04/17/2020   Malignant neoplasm of upper-inner quadrant of right breast in female, estrogen receptor positive ([place]) 12/26/2019   Encephalopathy 02/02/2018   Hypothyroidism 02/02/2018   AKI (acute kidney injury) ([place]) 02/02/2018   Chronic back pain 02/02/2018   Alcohol abuse 02/02/2018   Weight gain 02/02/2018   Benign essential HTN 02/02/2018    Silvestre Mesi 08/13/2021, 2:32 PM  [facility] Physical Therapy [address] [place], [place], [address] Phone: [phone]   Fax:  [phone]  Name: [name] MRN: [id number] Date of Birth: [birth year]

## 2021-08-14 ENCOUNTER — Telehealth: Payer: Self-pay

## 2021-08-14 NOTE — Telephone Encounter (Signed)
Pharm called requesting a refill for tramadol for the patient

## 2021-08-18 ENCOUNTER — Ambulatory Visit (INDEPENDENT_AMBULATORY_CARE_PROVIDER_SITE_OTHER): Payer: Medicare PPO | Admitting: Physical Therapy

## 2021-08-18 ENCOUNTER — Other Ambulatory Visit: Payer: Self-pay

## 2021-08-18 DIAGNOSIS — M25512 Pain in left shoulder: Secondary | ICD-10-CM

## 2021-08-18 DIAGNOSIS — M6281 Muscle weakness (generalized): Secondary | ICD-10-CM | POA: Diagnosis not present

## 2021-08-18 DIAGNOSIS — R262 Difficulty in walking, not elsewhere classified: Secondary | ICD-10-CM | POA: Diagnosis not present

## 2021-08-18 DIAGNOSIS — M25561 Pain in right knee: Secondary | ICD-10-CM | POA: Diagnosis not present

## 2021-08-18 DIAGNOSIS — M25612 Stiffness of left shoulder, not elsewhere classified: Secondary | ICD-10-CM | POA: Diagnosis not present

## 2021-08-18 DIAGNOSIS — R293 Abnormal posture: Secondary | ICD-10-CM

## 2021-08-18 DIAGNOSIS — R600 Localized edema: Secondary | ICD-10-CM | POA: Diagnosis not present

## 2021-08-18 NOTE — Therapy (Signed)
[facility] [address] [place], [place], [address] Phone: [phone]   Fax:  [phone]  Physical Therapy Treatment/Progress note Progress Note reporting period 07/14/21 to 08/18/21  See below for objective and subjective measurements relating to patients progress with PT.   Patient Details  Name: [name] MRN: [id number] Date of Birth: 1951-04-29 Referring Provider (PT): Donella Stade, [place]   Encounter Date: 08/18/2021   PT End of Session - 08/18/21 1431     Visit Number 30    Number of Visits 32    Date for PT Re-Evaluation 08/28/21    Authorization Type [employer]    Authorization Time Period 7/27-9/27    Authorization - Visit Number 10    Authorization - Number of Visits 12    Progress Note Due on Visit 31    PT Start Time 3235    PT Stop Time 1423    PT Time Calculation (min) 38 min    Activity Tolerance Patient tolerated treatment well    Behavior During Therapy [facility] for tasks assessed/performed             Past Medical History:  Diagnosis Date   Anxiety    Atrial fibrillation [facility])    sees Dr. Audie Box   Cancer [facility])    breast   Chronic pain    Concussion 02/2007   ICU x 3 days   Depression    Hypertension    Hypothyroidism    Personal history of chemotherapy    Personal history of radiation therapy    Spinal stenosis    Thyroid disease ?1994    Past Surgical History:  Procedure Laterality Date   BREAST BIOPSY Right 12/20/2019   x2   BREAST LUMPECTOMY Right 01/22/2020   BREAST LUMPECTOMY WITH RADIOACTIVE SEED AND SENTINEL LYMPH NODE BIOPSY Right 01/22/2020   Procedure: RIGHT BREAST LUMPECTOMY WITH RADIOACTIVE SEED X2 AND RIGHT SENTINEL LYMPH NODE MAPPING;  Surgeon: Erroll Luna, MD;  Location: [place];  Service: General;  Laterality: Right;   BREAST SURGERY Right 03/1998   breast biopsy, benign   EYE SURGERY Right    PORT-A-CATH REMOVAL Right 04/30/2021   Procedure: REMOVAL  PORT-A-CATH;  Surgeon: Erroll Luna, MD;  Location: [place];  Service: General;  Laterality: Right;   PORTACATH PLACEMENT Right 01/22/2020   Procedure: INSERTION PORT-A-CATH WITH ULTRASOUND;  Surgeon: Erroll Luna, MD;  Location: [place];  Service: General;  Laterality: Right;   PORTACATH PLACEMENT Right 02/28/2020   Procedure: PORT A CATH REVISION;  Surgeon: Erroll Luna, MD;  Location: [place];  Service: General;  Laterality: Right;   REVERSE SHOULDER ARTHROPLASTY Left 02/02/2021   Procedure: LEFT REVERSE SHOULDER ARTHROPLASTY;  Surgeon: Meredith Pel, MD;  Location: [place];  Service: Orthopedics;  Laterality: Left;   SHOULDER SURGERY Right    SPINAL FUSION  03/04/2011   with ORIF    There were no vitals filed for this visit.   Subjective Assessment - 08/18/21 1430     Subjective She relays less than 2/10 overall pain today    Pertinent History anxiety, breast cancer with lumpectomy and radioactive seed placement 2021 and recent port removal, chronic pain, concussion 2008, HTN, depression, spinal fusion 2012.    Patient Stated Goals improve function of Lt arm, raise shoulder over her head to do her hair    Pain Onset 1 to 4 weeks ago    Pain Onset More than a month ago  [facility] PT Assessment - 08/18/21 0001       Assessment   Medical Diagnosis Z96.612 (ICD-10-CM) - History of arthroplasty of left shoulder    Referring Provider (PT) Magnant, Gerrianne Scale, PA-C    Onset Date/Surgical Date 02/02/21      AROM   Left Shoulder Flexion 138 Degrees    Left Shoulder ABduction 114 Degrees    Left Shoulder External Rotation 50 Degrees                           [facility] Adult PT Treatment/Exercise - 08/18/21 0001       Shoulder Exercises: Seated   Extension Both;20 reps    Theraband Level (Shoulder Extension) Level 4 (Blue)    Row Both;Theraband;20 reps    Theraband Level (Shoulder Row) Level 4 (Blue)     Horizontal ABduction Both    Theraband Level (Shoulder Horizontal ABduction) Level 2 (Red)    Horizontal ABduction Weight (lbs) 3X10    External Rotation Both    Theraband Level (Shoulder External Rotation) Level 2 (Red)    External Rotation Limitations 3X10    Flexion Left;Strengthening    Flexion Weight (lbs) 1    Flexion Limitations 2X10    ABduction Weight (lbs) 1    ABduction Limitations 2X10    Other Seated Exercises shoulder ER reaching behind her head 5 sec holds 2X10    Other Seated Exercises D1 and D2 flexion 1# 2x10 on Lt      Shoulder Exercises: Pulleys   Flexion 2 minutes    ABduction 2 minutes                      PT Short Term Goals - 07/07/21 1420       PT SHORT TERM GOAL #1   Title independent with intial HEP    Status Achieved      PT SHORT TERM GOAL #2   Title improve Lt shoulder passive ER to 30 deg    Status Achieved               PT Long Term Goals - 08/18/21 1444       PT LONG TERM GOAL #1   Title independent with final HEP    Status Achieved      PT LONG TERM GOAL #2   Title Lt shoulder active flexion and abduction improved to at least 90 deg in order to perform ADLs    Baseline met    Status Achieved      PT LONG TERM GOAL #3   Title report pain < 3/10 with activity for improved function    Status Achieved      PT LONG TERM GOAL #4   Title FOTO score improved to 61 for improved function    Status On-going      PT LONG TERM GOAL #5   Title demonstrate at least 3/5 Lt shoulder flex/abduction strength for improved function    Status On-going      PT LONG TERM GOAL #6   Title Pt will be able to perform sit to stand with UE support independently with pain </= 3/10.    Time 6    Period Weeks    Status New      PT LONG TERM GOAL #7   Title Pt will improve her right knee strength to >/=4/5.    Time 6    Period Weeks    Status  New                   Plan - 08/18/21 1432     Clinical Impression Statement  Only marginal improvements in AROM measurements and strength but PROM and pain is doing well. She is still unable to use her left arm to fix her hair as she would like. We are planning to finish up over the next visit or 2 to indepependent program as I feel it will take for time for her to develop more strength and she has a good understanding of her program. We then may see her back for her knee and balance after her knee fracture has healed.    Personal Factors and Comorbidities Comorbidity 3+    Comorbidities anxiety, breast cancer with lumpectomy and radioactive seed placement 2021 and recent port removal, chronic pain, concussion 2008, HTN, depression, spinal fusion 2012    Examination-Activity Limitations Locomotion Level;Reach Overhead;Dressing;Hygiene/Grooming;Lift;Toileting;Bathing    Examination-Participation Restrictions Cleaning;Meal Prep;Community Activity;Driving;Laundry    Stability/Clinical Decision Making Evolving/Moderate complexity    Rehab Potential Fair    PT Frequency 2x / week    PT Duration 6 weeks    PT Treatment/Interventions ADLs/Self Care Home Management;Therapeutic exercise;Patient/family education;Cryotherapy;DME Instruction;Ultrasound;Moist Heat;Functional mobility training;Therapeutic activities;Balance training;Neuromuscular re-education;Manual techniques;Vasopneumatic Device;Taping;Dry needling;Passive range of motion;Electrical Stimulation    PT Next Visit Plan look to DC next visit or so to independent program    PT Home Exercise Plan Access Code: [email or web address], added AROM flexion and abd against gravity in sitting and ER with red band    Consulted and Agree with Plan of Care Patient             Patient will benefit from skilled therapeutic intervention in order to improve the following deficits and impairments:  Postural dysfunction, Decreased range of motion, Decreased knowledge of precautions, Increased edema, Impaired UE functional use, Decreased strength,  Decreased mobility, Decreased balance, Pain, Difficulty walking, Decreased activity tolerance  Visit Diagnosis: Acute pain of left shoulder  Stiffness of left shoulder, not elsewhere classified  Abnormal posture  Localized edema  Muscle weakness (generalized)  Difficulty in walking, not elsewhere classified  Acute pain of right knee     Problem List Patient Active Problem List   Diagnosis Date Noted   Arthritis of left shoulder region    S/P reverse total shoulder arthroplasty, 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   Vitamin D deficiency 01/15/2021   Vitamin B12 deficiency 01/15/2021   Primary insomnia 01/15/2021   Morbid obesity ([place]) 01/15/2021   Moderate recurrent major depression ([place]) 01/15/2021   Benzodiazepine dependence ([place]) 01/15/2021   Port-A-Cath in place 07/22/2020   Left leg cellulitis 04/17/2020   Cellulitis of left leg 04/17/2020   Hypokalemia 04/17/2020   Macrocytic anemia 04/17/2020   Anxiety 04/17/2020   Depression 04/17/2020   Chronic diarrhea 04/17/2020   Chemotherapy induced diarrhea 04/17/2020   Malignant neoplasm of upper-inner quadrant of right breast in female, estrogen receptor positive ([place]) 12/26/2019   Encephalopathy 02/02/2018   Hypothyroidism 02/02/2018   AKI (acute kidney injury) ([place]) 02/02/2018   Chronic back pain 02/02/2018   Alcohol abuse 02/02/2018   Weight gain 02/02/2018   Benign essential HTN 02/02/2018    Debbe Odea 08/18/2021, 2:54 PM  [facility] Physical Therapy [address] [place], [place], [address] Phone: [phone]   Fax:  [phone]  Name: JALAN BODI MRN: [id number] Date of Birth: [birth year]

## 2021-08-20 ENCOUNTER — Other Ambulatory Visit: Payer: Self-pay

## 2021-08-20 ENCOUNTER — Ambulatory Visit (INDEPENDENT_AMBULATORY_CARE_PROVIDER_SITE_OTHER): Payer: Medicare PPO | Admitting: Physical Therapy

## 2021-08-20 DIAGNOSIS — R600 Localized edema: Secondary | ICD-10-CM

## 2021-08-20 DIAGNOSIS — R293 Abnormal posture: Secondary | ICD-10-CM

## 2021-08-20 DIAGNOSIS — M25612 Stiffness of left shoulder, not elsewhere classified: Secondary | ICD-10-CM | POA: Diagnosis not present

## 2021-08-20 DIAGNOSIS — M6281 Muscle weakness (generalized): Secondary | ICD-10-CM

## 2021-08-20 DIAGNOSIS — M25512 Pain in left shoulder: Secondary | ICD-10-CM

## 2021-08-20 NOTE — Therapy (Signed)
[facility] Therapy [address] [place], [place], [address] Phone: [phone]   Fax:  [phone]  Physical Therapy Treatment/Discharge  PHYSICAL THERAPY DISCHARGE SUMMARY  Visits from Start of Care: 31  Current functional level related to goals / functional outcomes: See below   Remaining deficits: See below   Education / Equipment: HEP Plan: Patient agrees to discharge.  Patient goals were mostly met. Patient is being discharged due to meeting the stated rehab goals.      Patient Details  Name: [name] MRN: [id number] Date of Birth: 1951/09/30 Referring Provider (PT): Donella Stade, [place]   Encounter Date: 08/20/2021   PT End of Session - 08/20/21 1420     Visit Number 31    Number of Visits 32    Date for PT Re-Evaluation 08/28/21    Authorization Type [employer]    Authorization Time Period 7/27-9/27    Authorization - Visit Number 11    Authorization - Number of Visits 12    Progress Note Due on Visit 67    PT Start Time 1345    PT Stop Time 1430    PT Time Calculation (min) 45 min    Activity Tolerance Patient tolerated treatment well    Behavior During Therapy [facility] for tasks assessed/performed             Past Medical History:  Diagnosis Date   Anxiety    Atrial fibrillation [facility])    sees Dr. Audie Box   Cancer [facility])    breast   Chronic pain    Concussion 02/2007   ICU x 3 days   Depression    Hypertension    Hypothyroidism    Personal history of chemotherapy    Personal history of radiation therapy    Spinal stenosis    Thyroid disease ?1994    Past Surgical History:  Procedure Laterality Date   BREAST BIOPSY Right 12/20/2019   x2   BREAST LUMPECTOMY Right 01/22/2020   BREAST LUMPECTOMY WITH RADIOACTIVE SEED AND SENTINEL LYMPH NODE BIOPSY Right 01/22/2020   Procedure: RIGHT BREAST LUMPECTOMY WITH RADIOACTIVE SEED X2 AND RIGHT SENTINEL LYMPH NODE MAPPING;  Surgeon: Erroll Luna, MD;  Location:  [place];  Service: General;  Laterality: Right;   BREAST SURGERY Right 03/1998   breast biopsy, benign   EYE SURGERY Right    PORT-A-CATH REMOVAL Right 04/30/2021   Procedure: REMOVAL PORT-A-CATH;  Surgeon: Erroll Luna, MD;  Location: [place];  Service: General;  Laterality: Right;   PORTACATH PLACEMENT Right 01/22/2020   Procedure: INSERTION PORT-A-CATH WITH ULTRASOUND;  Surgeon: Erroll Luna, MD;  Location: [place];  Service: General;  Laterality: Right;   PORTACATH PLACEMENT Right 02/28/2020   Procedure: PORT A CATH REVISION;  Surgeon: Erroll Luna, MD;  Location: [place];  Service: General;  Laterality: Right;   REVERSE SHOULDER ARTHROPLASTY Left 02/02/2021   Procedure: LEFT REVERSE SHOULDER ARTHROPLASTY;  Surgeon: Meredith Pel, MD;  Location: [place];  Service: Orthopedics;  Laterality: Left;   SHOULDER SURGERY Right    SPINAL FUSION  03/04/2011   with ORIF    There were no vitals filed for this visit.   Subjective Assessment - 08/20/21 1419     Subjective She relays less than 2/10 overal in her shoulder, she feels her shoulder has improved about 75%.    Pertinent History anxiety, breast cancer with lumpectomy and radioactive seed placement 2021 and recent port removal, chronic pain, concussion 2008,  HTN, depression, spinal fusion 2012.    Patient Stated Goals improve function of Lt arm, raise shoulder over her head to do her hair    Pain Onset 1 to 4 weeks ago    Pain Onset More than a month ago                [facility] PT Assessment - 08/20/21 0001       Assessment   Medical Diagnosis Z96.612 (ICD-10-CM) - History of arthroplasty of left shoulder    Referring Provider (PT) Magnant, Gerrianne Scale, PA-C    Onset Date/Surgical Date 02/02/21      AROM   Left Shoulder Flexion 150 Degrees    Left Shoulder ABduction 115 Degrees    Left Shoulder Internal Rotation --   [facility]   Left Shoulder External Rotation 55  Degrees      Strength   Left Shoulder Flexion 4/5    Left Shoulder ABduction 4-/5    Left Shoulder Internal Rotation 5/5    Left Shoulder External Rotation 4+/5                           [facility] Adult PT Treatment/Exercise - 08/20/21 0001       Shoulder Exercises: Seated   External Rotation Both    Theraband Level (Shoulder External Rotation) Level 2 (Red)    External Rotation Limitations 3X10    Flexion Limitations both 2# X10, then 1# X 10, then no wt X10    Abduction Both    ABduction Weight (lbs) 1    ABduction Limitations 2X10    Other Seated Exercises shoulder ER reaching behind her head 5 sec holds 2X10    Other Seated Exercises D1 and D2 flexion 1# 2x10 on Lt      Shoulder Exercises: Pulleys   Flexion 2 minutes    ABduction 2 minutes      Shoulder Exercises: ROM/Strengthening   UBE (Upper Arm Bike) L3 6 minutes (3 forward/2.5 back)                      PT Short Term Goals - 08/20/21 1422       PT SHORT TERM GOAL #1   Title independent with intial HEP    Status Achieved      PT SHORT TERM GOAL #2   Title improve Lt shoulder passive ER to 30 deg    Status Achieved               PT Long Term Goals - 08/20/21 1422       PT LONG TERM GOAL #1   Title independent with final HEP    Status Achieved      PT LONG TERM GOAL #2   Title Lt shoulder active flexion and abduction improved to at least 90 deg in order to perform ADLs    Baseline met    Status Achieved      PT LONG TERM GOAL #3   Title report pain < 3/10 with activity for improved function    Status Achieved      PT LONG TERM GOAL #4   Title FOTO score improved to 61 for improved function    Baseline 62.5 today    Status Achieved      PT LONG TERM GOAL #5   Title demonstrate at least 3/5 Lt shoulder flex/abduction strength for improved function    Baseline now 4  Status Achieved      PT LONG TERM GOAL #6   Title Pt will be able to perform sit to stand with  UE support independently with pain </= 3/10.    Time 6    Period Weeks    Status On-going      PT LONG TERM GOAL #7   Title Pt will improve her right knee strength to >/=4/5.    Time 6    Period Weeks    Status On-going                   Plan - 08/20/21 1425     Clinical Impression Statement She has made reasonable overall progress for her Lt shoulder and has met her PT goals for the most part. She does still lack some strength limiting her AROM for overhead activity and for her to groom her hair but this will hopefully continue to improve with more work at home and she has good understanding of what to do to strengthen at home. We will discharge her for now and she is in agreement but then we will likely see her again for her knee and balance if MD refers her back once she has follow up for stress fracture to her Rt knee.    Personal Factors and Comorbidities Comorbidity 3+    Comorbidities anxiety, breast cancer with lumpectomy and radioactive seed placement 2021 and recent port removal, chronic pain, concussion 2008, HTN, depression, spinal fusion 2012    Examination-Activity Limitations Locomotion Level;Reach Overhead;Dressing;Hygiene/Grooming;Lift;Toileting;Bathing    Examination-Participation Restrictions Cleaning;Meal Prep;Community Activity;Driving;Laundry    Stability/Clinical Decision Making Evolving/Moderate complexity    Rehab Potential Fair    PT Frequency 2x / week    PT Duration 6 weeks    PT Treatment/Interventions ADLs/Self Care Home Management;Therapeutic exercise;Patient/family education;Cryotherapy;DME Instruction;Ultrasound;Moist Heat;Functional mobility training;Therapeutic activities;Balance training;Neuromuscular re-education;Manual techniques;Vasopneumatic Device;Taping;Dry needling;Passive range of motion;Electrical Stimulation    PT Next Visit Plan DC    PT Home Exercise Plan Access Code: [email or web address], added AROM flexion and abd against gravity in sitting and  ER with red band    Consulted and Agree with Plan of Care Patient             Patient will benefit from skilled therapeutic intervention in order to improve the following deficits and impairments:  Postural dysfunction, Decreased range of motion, Decreased knowledge of precautions, Increased edema, Impaired UE functional use, Decreased strength, Decreased mobility, Decreased balance, Pain, Difficulty walking, Decreased activity tolerance  Visit Diagnosis: Acute pain of left shoulder  Stiffness of left shoulder, not elsewhere classified  Abnormal posture  Localized edema  Muscle weakness (generalized)     Problem List Patient Active Problem List   Diagnosis Date Noted   Arthritis of left shoulder region    S/P reverse total shoulder arthroplasty, 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   Vitamin D deficiency 01/15/2021   Vitamin B12 deficiency 01/15/2021   Primary insomnia 01/15/2021   Morbid obesity ([place]) 01/15/2021   Moderate recurrent major depression ([place]) 01/15/2021   Benzodiazepine dependence ([place]) 01/15/2021   Port-A-Cath in place 07/22/2020   Left leg cellulitis 04/17/2020   Cellulitis of left leg 04/17/2020   Hypokalemia 04/17/2020   Macrocytic anemia 04/17/2020   Anxiety 04/17/2020   Depression 04/17/2020   Chronic diarrhea 04/17/2020   Chemotherapy induced diarrhea 04/17/2020   Malignant neoplasm of upper-inner quadrant of right breast in female, estrogen receptor positive ([place]) 12/26/2019   Encephalopathy 02/02/2018   Hypothyroidism 02/02/2018   AKI (acute kidney injury) ([place])  02/02/2018   Chronic back pain 02/02/2018   Alcohol abuse 02/02/2018   Weight gain 02/02/2018   Benign essential HTN 02/02/2018    Silvestre Mesi 08/20/2021, 2:42 PM  [facility] Physical Therapy [address] [place], [place], [address] Phone: [phone]   Fax:  [phone]  Name: [name] MRN: [id number] Date of Birth: 1951/07/20

## 2021-08-24 NOTE — Progress Notes (Signed)
Cardiology Office Note:   Date:  08/26/2021  NAME:  [name]    MRN: [id number] DOB:  03/29/1951   PCP:  Maurice Small, MD  Cardiologist:  Evalina Field, MD  Electrophysiologist:  None   Referring MD: Maurice Small, MD   Chief Complaint  Patient presents with   Atrial Fibrillation   History of Present Illness:   [name] is a 70 y.o. female with a hx of breast CA s/p lumpectomy/radiation, obesity, persistent Afib who presents for follow-up. Diagnosed with Afib incidentally. No symptoms. Rate control strategy decided.  She reports she is doing well.  Denies any symptoms of chest pain or trouble breathing.  Not exercising due to recent right knee surgery.  However she has no symptoms.  Does not feel her heart racing.  Her rates are well controlled on metoprolol today.  Irregular rhythm noted on exam but this is okay.  No issues with Eliquis.  No bleeding reported.  She overall is doing very well.  Denies any symptoms today.  I have recommended to continue with a rate control strategy.  Problem List 1. Stage IA Breast Cancer -ER+/PR+/HER2+ -lumpectomy 01/22/2020 -radiation 05/2020-06/2020 -s/p adjuvant chemo -completed trastuzumab  2. HTN 3. Obesity -BMI 36 4. Former Smoker -94 years  5. Persistent atrial fibrillation  -Dx 01/27/2021 -CHADVASC=3 (age, sex, HTN) 6. LLE edema -negative DVT study -likely 2/2 trauma   Past Medical History: Past Medical History:  Diagnosis Date   Anxiety    Atrial fibrillation [facility])    sees Dr. Audie Box   Cancer [facility])    breast   Chronic pain    Concussion 02/2007   ICU x 3 days   Depression    Hypertension    Hypothyroidism    Personal history of chemotherapy    Personal history of radiation therapy    Spinal stenosis    Thyroid disease ?1994    Past Surgical History: Past Surgical History:  Procedure Laterality Date   BREAST BIOPSY Right 12/20/2019   x2   BREAST LUMPECTOMY Right 01/22/2020   BREAST LUMPECTOMY WITH  RADIOACTIVE SEED AND SENTINEL LYMPH NODE BIOPSY Right 01/22/2020   Procedure: RIGHT BREAST LUMPECTOMY WITH RADIOACTIVE SEED X2 AND RIGHT SENTINEL LYMPH NODE MAPPING;  Surgeon: Erroll Luna, MD;  Location: [place];  Service: General;  Laterality: Right;   BREAST SURGERY Right 03/1998   breast biopsy, benign   EYE SURGERY Right    PORT-A-CATH REMOVAL Right 04/30/2021   Procedure: REMOVAL PORT-A-CATH;  Surgeon: Erroll Luna, MD;  Location: [place];  Service: General;  Laterality: Right;   PORTACATH PLACEMENT Right 01/22/2020   Procedure: INSERTION PORT-A-CATH WITH ULTRASOUND;  Surgeon: Erroll Luna, MD;  Location: [place];  Service: General;  Laterality: Right;   PORTACATH PLACEMENT Right 02/28/2020   Procedure: PORT A CATH REVISION;  Surgeon: Erroll Luna, MD;  Location: [place];  Service: General;  Laterality: Right;   REVERSE SHOULDER ARTHROPLASTY Left 02/02/2021   Procedure: LEFT REVERSE SHOULDER ARTHROPLASTY;  Surgeon: Meredith Pel, MD;  Location: [place];  Service: Orthopedics;  Laterality: Left;   SHOULDER SURGERY Right    SPINAL FUSION  03/04/2011   with ORIF    Current Medications: Current Meds  Medication Sig   amitriptyline (ELAVIL) 25 MG tablet Take 75 mg by mouth at bedtime.    amLODipine (NORVASC) 5 MG tablet Take 7.5 mg by mouth daily in the afternoon.   anastrozole (ARIMIDEX) 1 MG tablet  Take 1 tablet (1 mg total) by mouth daily.   apixaban (ELIQUIS) 5 MG TABS tablet TAKE ONE TABLET BY MOUTH TWICE DAILY.   celecoxib (CELEBREX) 100 MG capsule Take 1 capsule (100 mg total) by mouth 2 (two) times daily.   diazepam (VALIUM) 10 MG tablet TAKE 1 TABLET BY MOUTH AT BEDTIME IF NEEDED FOR SLEEP.   furosemide (LASIX) 20 MG tablet Take 20 mg by mouth daily. For HTN   ibuprofen (ADVIL) 800 MG tablet Take 1 tablet (800 mg total) by mouth every 8 (eight) hours as needed.   levothyroxine (SYNTHROID) 150 MCG tablet Take  150 mcg by mouth daily in the afternoon.   venlafaxine XR (EFFEXOR-XR) 150 MG 24 hr capsule Take 150 mg by mouth daily in the afternoon.     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  Other Topics Concern   Not on file  Social History Narrative   Not on file   Social Determinants of Health   Financial Resource Strain: Not on file  Food Insecurity: Not on file  Transportation Needs: Not on file  Physical Activity: Not on file  Stress: Not on file  Social Connections: Not on file     Family History: The patient's family history includes Dementia in her mother; Diabetes in her father and sister; Hypertension in her sister; Stroke in her father; Thyroid disease in her mother.  ROS:   All other ROS reviewed and negative. Pertinent positives noted in the HPI.     EKGs/Labs/Other Studies Reviewed:   The following studies were personally reviewed by me today:  TTE 04/20/2021   1. Left ventricular ejection fraction, by estimation, is 60 to 65%. The  left ventricle has normal function. The left ventricle has no regional  wall motion abnormalities. There is mild left ventricular hypertrophy of  the septal segment. Left ventricular  diastolic parameters are indeterminate.   2. Right ventricular systolic  function is normal. The right ventricular  size is normal. There is mildly elevated pulmonary artery systolic  pressure.   3. The mitral valve is normal in structure. No evidence of mitral valve  regurgitation. No evidence of mitral stenosis.   4. The aortic valve is tricuspid. Aortic valve regurgitation is trivial.  No aortic stenosis is present.   5. The inferior vena cava is normal in size with greater than 50%  respiratory variability, suggesting right atrial pressure of 3 mmHg.   Recent Labs: 01/06/2021: ALT 21 01/28/2021: TSH 8.120 02/04/2021: Hemoglobin 10.0; Platelets 219 04/28/2021: BUN 15; Creatinine, Ser 0.69; Potassium 4.4; Sodium 138   Recent Lipid Panel No results found for: CHOL, TRIG, HDL, CHOLHDL, VLDL, LDLCALC, LDLDIRECT  Physical  Exam:   VS:  BP 137/85   Pulse 77   Ht '5\' 7"'  (1.702 m)   Wt 234 lb 6.4 oz (106.3 kg)   LMP  (LMP Unknown)   SpO2 96%   BMI 36.71 kg/m    Wt Readings from Last 3 Encounters:  08/26/21 234 lb 6.4 oz (106.3 kg)  04/30/21 230 lb (104.3 kg)  02/02/21 232 lb 5.8 oz (105.4 kg)    General: Well nourished, well developed, in no acute distress Head: Atraumatic, normal size  Eyes: PEERLA, EOMI  Neck: Supple, no JVD Endocrine: No thryomegaly Cardiac: Normal S1, S2; irregular rhythm, no murmurs rubs or gallops Lungs: Clear to auscultation bilaterally, no wheezing, rhonchi or rales  Abd: Soft, nontender, no hepatomegaly  Ext: No edema, pulses 2+ Musculoskeletal: No deformities, BUE and BLE strength normal and equal Skin: Warm and dry, no rashes   Neuro: Alert and oriented to person, place, time, and situation, CNII-XII grossly intact, no focal deficits  Psych: Normal mood and affect   ASSESSMENT:   ANAHLA BEVIS is a 70 y.o. female who presents for the following: 1. Persistent atrial fibrillation ([place])   2. Primary hypertension   3. Obesity (BMI 30-39.9)     PLAN:   1. Persistent atrial fibrillation (HCC) -CHADSVASC=3.  Atrial  fibrillation was found incidentally after recent procedure.  No symptoms from this.  Echocardiogram shows normal LV function.  We discussed rhythm versus rate control strategy and since she has no symptoms we just decided on rate control.  Heart rate is well controlled today on metoprolol tartrate 25 twice daily.  We will continue this.  I really see no need for cardioversion given her lack of symptoms.  I cannot make her feel any better as she has no symptoms.  She will continue Eliquis 5 mg twice daily.  No issues with this.  No bleeding reported.  2. Primary hypertension -Well-controlled today.  3. Obesity (BMI 30-39.9) -Exercise recommended.  Disposition: Return in about 1 year (around 08/26/2022).  Medication Adjustments/Labs and Tests Ordered: Current medicines are reviewed at length with the patient today.  Concerns regarding medicines are outlined above.  No orders of the defined types were placed in this encounter.  No orders of the defined types were placed in this encounter.   Patient Instructions  Medication Instructions:  The current medical regimen is effective;  continue present plan and medications.  *If you need a refill on your cardiac medications before your next appointment, please call your pharmacy*   Follow-Up: At [facility], you and your health needs are our priority.  As part of our continuing mission to provide you with exceptional heart care, we have created designated Provider Care Teams.  These Care Teams include your primary Cardiologist (physician) and Advanced Practice Providers (APPs -  Physician Assistants and Nurse Practitioners) who all work together to provide you with the care you need, when you need it.  We recommend signing up for the patient portal called "MyChart".  Sign up information is provided on this After Visit Summary.  MyChart is used to connect with patients for Virtual Visits (Telemedicine).  Patients are able to view lab/test results,  encounter notes, upcoming appointments, etc.  Non-urgent messages can be sent to your provider as well.   To learn more about what you can do with MyChart, go to [email or web address].    Your next appointment:   12 month(s)  The format for your next appointment:   In Person  Provider:  You may see Evalina Field, MD or one of the following Advanced Practice Providers on your designated Care Team:   Almyra Deforest, PA-C Fabian Sharp, [place] or  Roby Lofts, PA-C     Time Spent with Patient: I have spent a total of 25 minutes with patient reviewing hospital notes, telemetry, EKGs, labs and examining the patient as well as establishing an assessment and plan that was discussed with the patient.  > 50% of time was spent in direct patient care.  Signed, Addison Naegeli. Audie Box, MD, [facility]  [address] [place], [place] [address] [phone]  08/26/2021 11:42 AM

## 2021-08-26 ENCOUNTER — Ambulatory Visit: Payer: Medicare PPO | Admitting: Orthopedic Surgery

## 2021-08-26 ENCOUNTER — Other Ambulatory Visit: Payer: Self-pay

## 2021-08-26 ENCOUNTER — Encounter: Payer: Self-pay | Admitting: Cardiovascular Disease

## 2021-08-26 ENCOUNTER — Ambulatory Visit: Payer: Medicare PPO | Admitting: Cardiovascular Disease

## 2021-08-26 VITALS — BP 137/85 | HR 77 | Ht 67.0 in | Wt 234.4 lb

## 2021-08-26 DIAGNOSIS — H26491 Other secondary cataract, right eye: Secondary | ICD-10-CM | POA: Diagnosis not present

## 2021-08-26 DIAGNOSIS — E669 Obesity, unspecified: Secondary | ICD-10-CM

## 2021-08-26 DIAGNOSIS — H2512 Age-related nuclear cataract, left eye: Secondary | ICD-10-CM | POA: Diagnosis not present

## 2021-08-26 DIAGNOSIS — H35363 Drusen (degenerative) of macula, bilateral: Secondary | ICD-10-CM | POA: Diagnosis not present

## 2021-08-26 DIAGNOSIS — H43393 Other vitreous opacities, bilateral: Secondary | ICD-10-CM | POA: Diagnosis not present

## 2021-08-26 DIAGNOSIS — I4819 Other persistent atrial fibrillation: Secondary | ICD-10-CM | POA: Diagnosis not present

## 2021-08-26 DIAGNOSIS — I10 Essential (primary) hypertension: Secondary | ICD-10-CM

## 2021-08-26 NOTE — Patient Instructions (Signed)
Medication Instructions:  The current medical regimen is effective;  continue present plan and medications.  *If you need a refill on your cardiac medications before your next appointment, please call your pharmacy*   Follow-Up: At [facility], you and your health needs are our priority.  As part of our continuing mission to provide you with exceptional heart care, we have created designated Provider Care Teams.  These Care Teams include your primary Cardiologist (physician) and Advanced Practice Providers (APPs -  Physician Assistants and Nurse Practitioners) who all work together to provide you with the care you need, when you need it.  We recommend signing up for the patient portal called "MyChart".  Sign up information is provided on this After Visit Summary.  MyChart is used to connect with patients for Virtual Visits (Telemedicine).  Patients are able to view lab/test results, encounter notes, upcoming appointments, etc.  Non-urgent messages can be sent to your provider as well.   To learn more about what you can do with MyChart, go to [email or web address].    Your next appointment:   12 month(s)  The format for your next appointment:   In Person  Provider:   You may see Evalina Field, MD or one of the following Advanced Practice Providers on your designated Care Team:   Almyra Deforest, PA-C Fabian Sharp, PA-C or  Roby Lofts, [place]

## 2021-08-28 DIAGNOSIS — C50911 Malignant neoplasm of unspecified site of right female breast: Secondary | ICD-10-CM | POA: Diagnosis not present

## 2021-09-04 ENCOUNTER — Other Ambulatory Visit: Payer: Self-pay

## 2021-09-04 ENCOUNTER — Ambulatory Visit: Payer: Medicare PPO | Admitting: Orthopedic Surgery

## 2021-09-04 DIAGNOSIS — M84361A Stress fracture, right tibia, initial encounter for fracture: Secondary | ICD-10-CM | POA: Diagnosis not present

## 2021-09-04 MED ORDER — TRAMADOL HCL 50 MG PO TABS: 50.0000 mg | ORAL_TABLET | Freq: Every day | ORAL | 0 refills | Status: DC | PRN

## 2021-09-05 ENCOUNTER — Encounter: Payer: Self-pay | Admitting: Orthopedic Surgery

## 2021-09-05 NOTE — Progress Notes (Signed)
Office Visit Note   Patient: [name]           Date of Birth: 24-Oct-1951           MRN: [id number] Visit Date: 09/04/2021 Requested by: Maurice Small, MD [place],  [place] [id number] PCP: Maurice Small, MD  Subjective: Chief Complaint  Patient presents with   Right Knee - Follow-up, Fracture    HPI: [name] is a 70 y.o. female who returns to the office for follow-up visit.  Plan from last visit was to obtain MRI scan for evaluation of stress fracture.  Patient now returns with significant improvement compared with prior appointment.  Reports she has no pain with ambulation and is able to ascend and descend stairs without discomfort.  All the previous medial right knee pain she was experiencing has now resolved and she is not really having to take anything for pain control.  She does have a history of multiple fractures in the past several years with multiple lab studies demonstrating vitamin D deficiency.  Denies any new complaints..  .                ROS: All systems reviewed are negative as they relate to the chief complaint within the history of present illness.  Patient denies fevers or chills.  Assessment & Plan: Visit Diagnoses:  1. Stress fracture of right tibia, initial encounter     Plan: [name] is a 70 y.o. female who returns to the office for follow-up visit for right knee stress fracture.  Plan from last visit was to obtain MRI scan.  MRI scan did reveal stress fracture of the medial tibial metaphysis on 07/24/2021.  She was nonweightbearing for several weeks before returning to weightbearing on her own volition..  They now return with significant improvement and she is able to ambulate without antalgia.  She is not taking any vitamin D supplement so recommend she take 1000 units of vitamin D daily along with calcium supplementation.  She has had multiple fractures in the past several years with no evaluation of her bone  density.  Recommended DEXA scan and she would like to have her primary doctor order this and go over the results with her.  One-time refill of tramadol given.    Additionally, patient feels that her balance has been off and she would like to try physical therapy to work on this.  She endorses continued low back pain so plan for referral to physical therapy upstairs.  She previously was worked with Colletta Maryland and Aaron Edelman and had great experiences.  Physical therapy to focus on lumbar spine exercises as well as gait training/balance exercises.  Follow-up in 2 months for clinical recheck regarding her back pain.  Follow-Up Instructions: No follow-ups on file.   Orders:  No orders of the defined types were placed in this encounter.  Meds ordered this encounter  Medications   traMADol (ULTRAM) 50 MG tablet    Sig: Take 1 tablet (50 mg total) by mouth daily as needed.    Dispense:  30 tablet    Refill:  0      Procedures: No procedures performed   Clinical Data: No additional findings.  Objective: Vital Signs: LMP  (LMP Unknown)   Physical Exam:  Constitutional: Patient appears well-developed HEENT:  Head: Normocephalic Eyes:EOM are normal Neck: Normal range of motion Cardiovascular: Normal rate Pulmonary/chest: Effort normal Neurologic: Patient is alert Skin: Skin is warm Psychiatric:  Patient has normal mood and affect  Ortho Exam: Ortho exam demonstrates right knee without effusion.  Mild tenderness over the medial joint line and the medial proximal tibia.  No tenderness over the lateral joint line.  No calf tenderness.  Negative Homans' sign.  Able to form straight leg raise.  Ambulates without antalgia.  No limp noted.  No pain with hip range of motion.  Specialty Comments:  No specialty comments available.  Imaging: No results found.   PMFS History: Patient Active Problem List   Diagnosis Date Noted   Arthritis of left shoulder region    S/P reverse total shoulder  arthroplasty, 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   Vitamin D deficiency 01/15/2021   Vitamin B12 deficiency 01/15/2021   Primary insomnia 01/15/2021   Morbid obesity ([place]) 01/15/2021   Moderate recurrent major depression ([place]) 01/15/2021   Benzodiazepine dependence ([place]) 01/15/2021   Port-A-Cath in place 07/22/2020   Left leg cellulitis 04/17/2020   Cellulitis of left leg 04/17/2020   Hypokalemia 04/17/2020   Macrocytic anemia 04/17/2020   Anxiety 04/17/2020   Depression 04/17/2020   Chronic diarrhea 04/17/2020   Chemotherapy induced diarrhea 04/17/2020   Malignant neoplasm of upper-inner quadrant of right breast in female, estrogen receptor positive ([place]) 12/26/2019   Encephalopathy 02/02/2018   Hypothyroidism 02/02/2018   AKI (acute kidney injury) ([place]) 02/02/2018   Chronic back pain 02/02/2018   Alcohol abuse 02/02/2018   Weight gain 02/02/2018   Benign essential HTN 02/02/2018   Past Medical History:  Diagnosis Date   Anxiety    Atrial fibrillation [facility])    sees Dr. Audie Box   Cancer [facility])    breast   Chronic pain    Concussion 02/2007   ICU x 3 days   Depression    Hypertension    Hypothyroidism    Personal history of chemotherapy    Personal history of radiation therapy    Spinal stenosis    Thyroid disease ?1994    Family History  Problem Relation Age of Onset   Thyroid disease Mother    Dementia Mother    Diabetes Father    Stroke Father    Hypertension Sister    Diabetes Sister     Past Surgical History:  Procedure Laterality Date   BREAST BIOPSY Right 12/20/2019   x2   BREAST LUMPECTOMY Right 01/22/2020   BREAST LUMPECTOMY WITH RADIOACTIVE SEED AND SENTINEL LYMPH NODE BIOPSY Right 01/22/2020   Procedure: RIGHT BREAST LUMPECTOMY WITH RADIOACTIVE SEED X2 AND RIGHT SENTINEL LYMPH NODE MAPPING;  Surgeon: Erroll Luna, MD;  Location: [place];  Service: General;  Laterality: Right;   BREAST SURGERY Right 03/1998   breast biopsy, benign   EYE SURGERY  Right    PORT-A-CATH REMOVAL Right 04/30/2021   Procedure: REMOVAL PORT-A-CATH;  Surgeon: Erroll Luna, MD;  Location: [place];  Service: General;  Laterality: Right;   PORTACATH PLACEMENT Right 01/22/2020   Procedure: INSERTION PORT-A-CATH WITH ULTRASOUND;  Surgeon: Erroll Luna, MD;  Location: [place];  Service: General;  Laterality: Right;   PORTACATH PLACEMENT Right 02/28/2020   Procedure: PORT A CATH REVISION;  Surgeon: Erroll Luna, MD;  Location: [place];  Service: General;  Laterality: Right;   REVERSE SHOULDER ARTHROPLASTY Left 02/02/2021   Procedure: LEFT REVERSE SHOULDER ARTHROPLASTY;  Surgeon: Meredith Pel, MD;  Location: [place];  Service: Orthopedics;  Laterality: Left;   SHOULDER SURGERY Right    SPINAL FUSION  03/04/2011   with ORIF  Social History   Occupational History   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

## 2021-09-07 DIAGNOSIS — H2512 Age-related nuclear cataract, left eye: Secondary | ICD-10-CM | POA: Diagnosis not present

## 2021-09-08 ENCOUNTER — Other Ambulatory Visit: Payer: Self-pay

## 2021-09-08 DIAGNOSIS — M545 Low back pain, unspecified: Secondary | ICD-10-CM

## 2021-09-08 DIAGNOSIS — C50911 Malignant neoplasm of unspecified site of right female breast: Secondary | ICD-10-CM | POA: Diagnosis not present

## 2021-09-09 ENCOUNTER — Other Ambulatory Visit: Payer: Self-pay

## 2021-09-09 ENCOUNTER — Ambulatory Visit (INDEPENDENT_AMBULATORY_CARE_PROVIDER_SITE_OTHER): Payer: Medicare PPO | Admitting: Physical Therapy

## 2021-09-09 DIAGNOSIS — M545 Low back pain, unspecified: Secondary | ICD-10-CM | POA: Diagnosis not present

## 2021-09-09 DIAGNOSIS — R2689 Other abnormalities of gait and mobility: Secondary | ICD-10-CM

## 2021-09-09 DIAGNOSIS — M6281 Muscle weakness (generalized): Secondary | ICD-10-CM

## 2021-09-09 DIAGNOSIS — G8929 Other chronic pain: Secondary | ICD-10-CM

## 2021-09-09 DIAGNOSIS — M25561 Pain in right knee: Secondary | ICD-10-CM

## 2021-09-09 NOTE — Patient Instructions (Signed)
Access Code: [email or web address] URL: [email or web address] Date: 09/09/2021 Prepared by: Elsie Ra  Exercises Supine Lower Trunk Rotation - 2 x daily - 6 x weekly - 1 sets - 5 reps - 10 sec hold Supine Figure 4 Piriformis Stretch - 2 x daily - 6 x weekly - 1 sets - 3 reps - 30 hold Supine Bridge - 2 x daily - 6 x weekly - 1-2 sets - 10 reps - 5 hold Sit to Stand Without Arm Support - 2 x daily - 6 x weekly - 1-2 sets - 5 reps Standing Tandem Balance with Counter Support - 2 x daily - 6 x weekly - 1 sets - 3 reps - 30 hold Walking with Head Rotation - 2 x daily - 6 x weekly - 3 reps

## 2021-09-10 ENCOUNTER — Encounter: Payer: Self-pay | Admitting: Physical Therapy

## 2021-09-10 ENCOUNTER — Encounter: Payer: Medicare PPO | Admitting: Physical Therapy

## 2021-09-10 NOTE — Therapy (Signed)
[facility] [address] [place], [place], [address] Phone: [phone]   Fax:  [phone]  Physical Therapy Evaluation Referring diagnosis? M54.5 Treatment diagnosis? (if different than referring diagnosis) R26.89 What was this (referring dx) caused by? [x]  Surgery []  Fall [x]  Ongoing issue [x]  Arthritis []  Other: ____________  Laterality: []  Rt []  Lt [x]  Both  Check all possible CPT codes:      [x]  97110 (Therapeutic Exercise)  []  92507 (SLP Treatment)  [x]  29924 (Neuro Re-ed)   []  92526 (Swallowing Treatment)   [x]  97116 (Gait Training)   []  [id number] (Cognitive Training, 1st 15 minutes) [x]  97140 (Manual Therapy)   []  97130 (Cognitive Training, each add'l 15 minutes)  [x]  97530 (Therapeutic Activities)  []  Other, List CPT Code ____________    []  26834 (Self Care)       []  All codes above (97110 - 97535)  []  97012 (Mechanical Traction)  [x]  97014 (E-stim Unattended)  []  97032 (E-stim manual)  []  97033 (Ionto)  [x]  97035 (Ultrasound)  []  97760 (Orthotic Fit) []  97750 (Physical Performance Training) []  [id number] (Aquatic Therapy) []  97034 (Contrast Bath) []  [id number] (Paraffin) []  97597 (Wound Care 1st 20 sq cm) []  97598 (Wound Care each add'l 20 sq cm) [x]  97016 (Vasopneumatic Device) []  [phone] (Orthotic Training) []  [id number] (Prosthetic Training)   Patient Details  Name: [name] MRN: [id number] Date of Birth: 01/09/1951 Referring Provider (PT): Donella Stade, [place]   Encounter Date: 09/09/2021   PT End of Session - 09/10/21 0824     Visit Number 1    Number of Visits 20    Date for PT Re-Evaluation 12/03/21    Authorization Type [employer]    Authorization Time Period look for updated auth info next visit    Authorization - Visit Number --    Authorization - Number of Visits --    Progress Note Due on Visit 10    PT Start Time 1515    PT Stop Time 1553    PT Time Calculation (min) 38 min    Activity Tolerance Patient  tolerated treatment well    Behavior During Therapy [facility] for tasks assessed/performed             Past Medical History:  Diagnosis Date   Anxiety    Atrial fibrillation [facility])    sees Dr. Audie Box   Cancer [facility])    breast   Chronic pain    Concussion 02/2007   ICU x 3 days   Depression    Hypertension    Hypothyroidism    Personal history of chemotherapy    Personal history of radiation therapy    Spinal stenosis    Thyroid disease ?1994    Past Surgical History:  Procedure Laterality Date   BREAST BIOPSY Right 12/20/2019   x2   BREAST LUMPECTOMY Right 01/22/2020   BREAST LUMPECTOMY WITH RADIOACTIVE SEED AND SENTINEL LYMPH NODE BIOPSY Right 01/22/2020   Procedure: RIGHT BREAST LUMPECTOMY WITH RADIOACTIVE SEED X2 AND RIGHT SENTINEL LYMPH NODE MAPPING;  Surgeon: Erroll Luna, MD;  Location: [place];  Service: General;  Laterality: Right;   BREAST SURGERY Right 03/1998   breast biopsy, benign   EYE SURGERY Right    PORT-A-CATH REMOVAL Right 04/30/2021   Procedure: REMOVAL PORT-A-CATH;  Surgeon: Erroll Luna, MD;  Location: [place];  Service: General;  Laterality: Right;   PORTACATH PLACEMENT Right 01/22/2020   Procedure: INSERTION PORT-A-CATH WITH ULTRASOUND;  Surgeon: Erroll Luna, MD;  Location: [place];  Service: General;  Laterality: Right;   PORTACATH PLACEMENT Right 02/28/2020   Procedure: PORT A CATH REVISION;  Surgeon: Erroll Luna, MD;  Location: [place];  Service: General;  Laterality: Right;   REVERSE SHOULDER ARTHROPLASTY Left 02/02/2021   Procedure: LEFT REVERSE SHOULDER ARTHROPLASTY;  Surgeon: Meredith Pel, MD;  Location: [place];  Service: Orthopedics;  Laterality: Left;   SHOULDER SURGERY Right    SPINAL FUSION  03/04/2011   with ORIF    There were no vitals filed for this visit.    Subjective Assessment - 09/10/21 0810     Subjective She relays chronic LBP that is worse when she gets  up, she had recent stress fx to her Rt knee and she is not having knee pain anymore but is still using SPC as she was healing from stress fx. She also feels very unsteady and is fearful of falling. She has 4-5 out of 10 pain in her low back.    Pertinent History anxiety, breast cancer with lumpectomy and radioactive seed placement 2021 and recent port removal, chronic pain, concussion 2008, HTN, depression, spinal fusion 2012.    Patient Stated Goals improve gait and balance and back pain    Pain Score 5     Pain Location Back    Pain Descriptors / Indicators Aching    Pain Onset More than a month ago    Pain Frequency Constant    Aggravating Factors  being in one place too long    Pain Relieving Factors changing postiions, heat    Multiple Pain Sites No    Pain Onset More than a month ago                [facility] PT Assessment - 09/10/21 0001       Assessment   Medical Diagnosis LBP, Lt knee pain and weakness from Stress Fx,    Referring Provider (PT) Magnant, Gerrianne Scale, PA-C      Restrictions   Other Position/Activity Restrictions WBAT Rt leg      Balance Screen   Has the patient fallen in the past 6 months No   not in last 6 months but has fallen in the past year and is very fearful of falling   Has the patient had a decrease in activity level because of a fear of falling?  No    Is the patient reluctant to leave their home because of a fear of falling?  No      Home Ecologist residence      Prior Function   Level of Independence Independent with basic ADLs      Cognition   Overall Cognitive Status Within Functional Limits for tasks assessed      Observation/Other Assessments   Focus on Therapeutic Outcomes (FOTO)  did not perform, multiple impairments and areas to address      Posture/Postural Control   Posture Comments slumpted posture      ROM / Strength   AROM / PROM / Strength AROM;Strength      AROM   AROM Assessment Site  Lumbar    Lumbar Flexion 100%    Lumbar Extension 50%    Lumbar - Right Side Bend 50%    Lumbar - Left Side Bend 50%    Lumbar - Right Rotation 50%    Lumbar - Left Rotation 50%      Strength   Overall Strength Comments leg strength  grossly 4/5 MMT tested in sitting      Balance   Balance Assessed Yes      Standardized Balance Assessment   Standardized Balance Assessment Timed Up and Go Test;Berg Balance Test      Berg Balance Test   Sit to Stand Able to stand without using hands and stabilize independently    Standing Unsupported Able to stand safely 2 minutes    Sitting with Back Unsupported but Feet Supported on Floor or Stool Able to sit safely and securely 2 minutes    Stand to Sit Sits safely with minimal use of hands    Transfers Able to transfer safely, minor use of hands    Standing Unsupported with Eyes Closed Able to stand 10 seconds with supervision    Standing Unsupported with Feet Together Able to place feet together independently and stand 1 minute safely    From Standing, Reach Forward with Outstretched Arm Can reach confidently >25 cm (10")    From Standing Position, Pick up Object from Floor Able to pick up shoe, needs supervision    From Standing Position, Turn to Look Behind Over each Shoulder Looks behind from both sides and weight shifts well    Turn 360 Degrees Able to turn 360 degrees safely but slowly    Standing Unsupported, Alternately Place Feet on Step/Stool Needs assistance to keep from falling or unable to try    Standing Unsupported, One Foot in Front Needs help to step but can hold 15 seconds    Standing on One Leg Unable to try or needs assist to prevent fall    Total Score 41      Timed Up and Go Test   Normal TUG (seconds) 19    TUG Comments without AD                        Objective measurements completed on examination: See above findings.                PT Education - 09/10/21 [phone]     Education Details HEP  was texted to her, we can print if she needs [place] to in future    Person(s) Educated Patient    Methods Explanation    Comprehension Need further instruction;Verbalized understanding              PT Short Term Goals - 09/10/21 0852       PT SHORT TERM GOAL #1   Title independent with intial HEP    Time 4    Period Weeks    Status New    Target Date 10/08/21      PT SHORT TERM GOAL #2   Title improve TUG to less than 17 seconds    Baseline 19    Time 4    Period Weeks    Status New    Target Date 10/08/21               PT Long Term Goals - 09/10/21 0853       PT LONG TERM GOAL #1   Title She will improve BERG balance score to 46 or more to show improved balance    Baseline 41    Time 12    Period Weeks    Status New    Target Date 12/03/21      PT LONG TERM GOAL #2   Title She will improve TUG score to <14 seconds to show improved  balance and walk speed.    Time 12    Period Weeks    Status New      PT LONG TERM GOAL #3   Title report back pain < 3/10 with activity for improved function    Time 12    Period Weeks    Status New      PT LONG TERM GOAL #4   Title Improve bilat knee/hip strength to 5/5 MMT tested in sitting    Baseline 4/5    Time 12    Period Weeks    Status New      PT LONG TERM GOAL #5   Title -    Baseline -      PT LONG TERM GOAL #6   Title -      PT LONG TERM GOAL #7   Title -                    Plan - 09/10/21 0829     Clinical Impression Statement Pt was referred back to PT to work on her chronic LBP with gait and balance impairments. Of note she also had a Rt knee stress fracture so has Rt knee weakness and pain from this. She is now WBAT but does use SPC still. Her balance tests place her at a high falls risk. She was also seen in PT this year for her Lt shoulder rTSA and she did fairly well with this but still with residual weakness and difficulty reaching overhead. For now we will focus PT on her gait,  balance, LBP and knee strength.    Personal Factors and Comorbidities Comorbidity 3+    Comorbidities anxiety, breast cancer with lumpectomy and radioactive seed placement 2021 and recent port removal, chronic pain, concussion 2008, HTN, depression, spinal fusion 2012    Examination-Activity Limitations Locomotion Level;Reach Overhead;Dressing;Hygiene/Grooming;Lift;Toileting;Bathing    Examination-Participation Restrictions Cleaning;Meal Prep;Community Activity;Driving;Laundry    Stability/Clinical Decision Making Evolving/Moderate complexity    Clinical Decision Making Moderate    Rehab Potential Fair    PT Frequency 2x / week    PT Duration 12 weeks    PT Treatment/Interventions ADLs/Self Care Home Management;Therapeutic exercise;Patient/family education;Cryotherapy;DME Instruction;Ultrasound;Moist Heat;Functional mobility training;Therapeutic activities;Balance training;Neuromuscular re-education;Manual techniques;Vasopneumatic Device;Taping;Dry needling;Passive range of motion;Electrical Stimulation;Gait training    PT Next Visit Plan focus on gait, balance, LBP, did she get HEP text? consider DN as had good response to this in past    PT Home Exercise Plan Access Code: [email or web address], added AROM flexion and abd against gravity in sitting and ER with red band    Consulted and Agree with Plan of Care Patient             Patient will benefit from skilled therapeutic intervention in order to improve the following deficits and impairments:  Postural dysfunction, Decreased range of motion, Decreased knowledge of precautions, Increased edema, Impaired UE functional use, Decreased strength, Decreased mobility, Decreased balance, Pain, Difficulty walking, Decreased activity tolerance, Decreased endurance  Visit Diagnosis: Other abnormalities of gait and mobility  Chronic bilateral low back pain without sciatica  Muscle weakness (generalized)  Acute pain of right knee     Problem  List Patient Active Problem List   Diagnosis Date Noted   Arthritis of left shoulder region    S/P reverse total shoulder arthroplasty, 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   Vitamin D deficiency 01/15/2021   Vitamin B12 deficiency 01/15/2021   Primary insomnia 01/15/2021   Morbid obesity ([place]) 01/15/2021   Moderate recurrent major depression (  [place]) 01/15/2021   Benzodiazepine dependence ([place]) 01/15/2021   Port-A-Cath in place 07/22/2020   Left leg cellulitis 04/17/2020   Cellulitis of left leg 04/17/2020   Hypokalemia 04/17/2020   Macrocytic anemia 04/17/2020   Anxiety 04/17/2020   Depression 04/17/2020   Chronic diarrhea 04/17/2020   Chemotherapy induced diarrhea 04/17/2020   Malignant neoplasm of upper-inner quadrant of right breast in female, estrogen receptor positive ([place]) 12/26/2019   Encephalopathy 02/02/2018   Hypothyroidism 02/02/2018   AKI (acute kidney injury) ([place]) 02/02/2018   Chronic back pain 02/02/2018   Alcohol abuse 02/02/2018   Weight gain 02/02/2018   Benign essential HTN 02/02/2018    Debbe Odea, PT,DPT 09/10/2021, 9:23 AM  [facility] [address] [place], [place], [address] Phone: [phone]   Fax:  [phone]  Name: [name] MRN: [id number] Date of Birth: 31-Jan-1951

## 2021-09-14 ENCOUNTER — Telehealth: Payer: Self-pay

## 2021-09-14 NOTE — Telephone Encounter (Signed)
(  4:46 pm)SW called to schedule a palliative care visit with patient. She declined continuing with palliative care services at this time.

## 2021-09-15 ENCOUNTER — Encounter: Payer: Medicare PPO | Admitting: Physical Therapy

## 2021-09-15 ENCOUNTER — Other Ambulatory Visit: Payer: Self-pay

## 2021-09-15 ENCOUNTER — Ambulatory Visit (INDEPENDENT_AMBULATORY_CARE_PROVIDER_SITE_OTHER): Payer: Medicare PPO | Admitting: Physical Therapy

## 2021-09-15 ENCOUNTER — Encounter: Payer: Self-pay | Admitting: Physical Therapy

## 2021-09-15 DIAGNOSIS — M25561 Pain in right knee: Secondary | ICD-10-CM | POA: Diagnosis not present

## 2021-09-15 DIAGNOSIS — R2689 Other abnormalities of gait and mobility: Secondary | ICD-10-CM

## 2021-09-15 DIAGNOSIS — M6281 Muscle weakness (generalized): Secondary | ICD-10-CM | POA: Diagnosis not present

## 2021-09-15 DIAGNOSIS — G8929 Other chronic pain: Secondary | ICD-10-CM | POA: Diagnosis not present

## 2021-09-15 DIAGNOSIS — M545 Low back pain, unspecified: Secondary | ICD-10-CM | POA: Diagnosis not present

## 2021-09-15 NOTE — Therapy (Signed)
[facility] [address] [place], [place], [address] Phone: [phone]   Fax:  [phone]  Physical Therapy Treatment  Patient Details  Name: [name] MRN: [id number] Date of Birth: 05/05/1951 Referring Provider (PT): Donella Stade, [place]   Encounter Date: 09/15/2021   PT End of Session - 09/15/21 1011     Visit Number 2    Number of Visits 20    Date for PT Re-Evaluation 12/03/21    Authorization Type [employer]    Authorization Time Period 09/09/21-11/09/21    Authorization - Visit Number 2    Authorization - Number of Visits 12    Progress Note Due on Visit 10    PT Start Time 0930    PT Stop Time 1010    PT Time Calculation (min) 40 min    Activity Tolerance Patient tolerated treatment well    Behavior During Therapy [facility] for tasks assessed/performed             Past Medical History:  Diagnosis Date   Anxiety    Atrial fibrillation [facility])    sees Dr. Audie Box   Cancer [facility])    breast   Chronic pain    Concussion 02/2007   ICU x 3 days   Depression    Hypertension    Hypothyroidism    Personal history of chemotherapy    Personal history of radiation therapy    Spinal stenosis    Thyroid disease ?1994    Past Surgical History:  Procedure Laterality Date   BREAST BIOPSY Right 12/20/2019   x2   BREAST LUMPECTOMY Right 01/22/2020   BREAST LUMPECTOMY WITH RADIOACTIVE SEED AND SENTINEL LYMPH NODE BIOPSY Right 01/22/2020   Procedure: RIGHT BREAST LUMPECTOMY WITH RADIOACTIVE SEED X2 AND RIGHT SENTINEL LYMPH NODE MAPPING;  Surgeon: Erroll Luna, MD;  Location: [place];  Service: General;  Laterality: Right;   BREAST SURGERY Right 03/1998   breast biopsy, benign   EYE SURGERY Right    PORT-A-CATH REMOVAL Right 04/30/2021   Procedure: REMOVAL PORT-A-CATH;  Surgeon: Erroll Luna, MD;  Location: [place];  Service: General;  Laterality: Right;   PORTACATH PLACEMENT Right  01/22/2020   Procedure: INSERTION PORT-A-CATH WITH ULTRASOUND;  Surgeon: Erroll Luna, MD;  Location: [place];  Service: General;  Laterality: Right;   PORTACATH PLACEMENT Right 02/28/2020   Procedure: PORT A CATH REVISION;  Surgeon: Erroll Luna, MD;  Location: [place];  Service: General;  Laterality: Right;   REVERSE SHOULDER ARTHROPLASTY Left 02/02/2021   Procedure: LEFT REVERSE SHOULDER ARTHROPLASTY;  Surgeon: Meredith Pel, MD;  Location: [place];  Service: Orthopedics;  Laterality: Left;   SHOULDER SURGERY Right    SPINAL FUSION  03/04/2011   with ORIF    There were no vitals filed for this visit.   Subjective Assessment - 09/15/21 0929     Subjective knee is doing well; back is about a 3/10.  pain worse in the AM and has to take ibuprofen (supposed to take tylenol)    Pertinent History anxiety, breast cancer with lumpectomy and radioactive seed placement 2021 and recent port removal, chronic pain, concussion 2008, HTN, depression, spinal fusion 2012.    Patient Stated Goals improve gait and balance and back pain    Currently in Pain? Yes    Pain Score 3     Pain Location Back    Pain Orientation Left    Pain Descriptors / Indicators Aching  Pain Type Acute pain    Pain Onset More than a month ago    Pain Frequency Constant    Aggravating Factors  being in one place too long    Pain Relieving Factors changing positions, heat                               [facility] Adult PT Treatment/Exercise - 09/15/21 0937       Exercises   Exercises Lumbar      Lumbar Exercises: Stretches   Lower Trunk Rotation 5 reps;10 seconds    Piriformis Stretch Right;Left;3 reps;30 seconds      Lumbar Exercises: Aerobic   Nustep L6 x 10 min      Lumbar Exercises: Seated   Sit to Stand 10 reps      Lumbar Exercises: Supine   Bridge 20 reps;3 seconds                 Balance Exercises - 09/15/21 1004       Balance Exercises: Standing    Tandem Stance Upper extremity support 1;2 reps;30 secs    Gait with Head Turns Forward;Upper extremity support;4 reps                  PT Short Term Goals - 09/10/21 4174       PT SHORT TERM GOAL #1   Title independent with intial HEP    Time 4    Period Weeks    Status New    Target Date 10/08/21      PT SHORT TERM GOAL #2   Title improve TUG to less than 17 seconds    Baseline 19    Time 4    Period Weeks    Status New    Target Date 10/08/21               PT Long Term Goals - 09/10/21 0853       PT LONG TERM GOAL #1   Title She will improve BERG balance score to 46 or more to show improved balance    Baseline 41    Time 12    Period Weeks    Status New    Target Date 12/03/21      PT LONG TERM GOAL #2   Title She will improve TUG score to <14 seconds to show improved balance and walk speed.    Time 12    Period Weeks    Status New      PT LONG TERM GOAL #3   Title report back pain < 3/10 with activity for improved function    Time 12    Period Weeks    Status New      PT LONG TERM GOAL #4   Title Improve bilat knee/hip strength to 5/5 MMT tested in sitting    Baseline 4/5    Time 12    Period Weeks    Status New      PT LONG TERM GOAL #5   Title -    Baseline -      PT LONG TERM GOAL #6   Title -      PT LONG TERM GOAL #7   Title -                   Plan - 09/15/21 1012     Clinical Impression Statement Session today focused on review of HEP  as she reports not being able to access through text, so printed copy today.  Mod cues needed today, and will benefit from additional review.  Will continue to benefit from PT to maximize function.    Personal Factors and Comorbidities Comorbidity 3+    Comorbidities anxiety, breast cancer with lumpectomy and radioactive seed placement 2021 and recent port removal, chronic pain, concussion 2008, HTN, depression, spinal fusion 2012    Examination-Activity Limitations Locomotion  Level;Reach Overhead;Dressing;Hygiene/Grooming;Lift;Toileting;Bathing    Examination-Participation Restrictions Cleaning;Meal Prep;Community Activity;Driving;Laundry    Stability/Clinical Decision Making Evolving/Moderate complexity    Rehab Potential Fair    PT Frequency 2x / week    PT Duration 12 weeks    PT Treatment/Interventions ADLs/Self Care Home Management;Therapeutic exercise;Patient/family education;Cryotherapy;DME Instruction;Ultrasound;Moist Heat;Functional mobility training;Therapeutic activities;Balance training;Neuromuscular re-education;Manual techniques;Vasopneumatic Device;Taping;Dry needling;Passive range of motion;Electrical Stimulation;Gait training    PT Next Visit Plan focus on gait, balance, LBP, review HEP PRN;  consider DN as had good response to this in past    PT Home Exercise Plan Access Code: [email or web address], added AROM flexion and abd against gravity in sitting and ER with red band    Consulted and Agree with Plan of Care Patient             Patient will benefit from skilled therapeutic intervention in order to improve the following deficits and impairments:  Postural dysfunction, Decreased range of motion, Decreased knowledge of precautions, Increased edema, Impaired UE functional use, Decreased strength, Decreased mobility, Decreased balance, Pain, Difficulty walking, Decreased activity tolerance, Decreased endurance  Visit Diagnosis: Other abnormalities of gait and mobility  Chronic bilateral low back pain without sciatica  Muscle weakness (generalized)  Acute pain of right knee     Problem List Patient Active Problem List   Diagnosis Date Noted   Arthritis of left shoulder region    S/P reverse total shoulder arthroplasty, 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   Vitamin D deficiency 01/15/2021   Vitamin B12 deficiency 01/15/2021   Primary insomnia 01/15/2021   Morbid obesity ([place]) 01/15/2021   Moderate recurrent major depression ([place]) 01/15/2021   Benzodiazepine  dependence ([place]) 01/15/2021   Port-A-Cath in place 07/22/2020   Left leg cellulitis 04/17/2020   Cellulitis of left leg 04/17/2020   Hypokalemia 04/17/2020   Macrocytic anemia 04/17/2020   Anxiety 04/17/2020   Depression 04/17/2020   Chronic diarrhea 04/17/2020   Chemotherapy induced diarrhea 04/17/2020   Malignant neoplasm of upper-inner quadrant of right breast in female, estrogen receptor positive ([place]) 12/26/2019   Encephalopathy 02/02/2018   Hypothyroidism 02/02/2018   AKI (acute kidney injury) ([place]) 02/02/2018   Chronic back pain 02/02/2018   Alcohol abuse 02/02/2018   Weight gain 02/02/2018   Benign essential HTN 02/02/2018      Laureen Abrahams, PT, DPT 09/15/21 10:14 AM     [facility] [address] [place], [place], [address] Phone: [phone]   Fax:  [phone]  Name: [name] MRN: [id number] Date of Birth: Jul 06, 1951

## 2021-09-16 ENCOUNTER — Encounter: Payer: Medicare PPO | Admitting: Physical Therapy

## 2021-09-16 ENCOUNTER — Ambulatory Visit (INDEPENDENT_AMBULATORY_CARE_PROVIDER_SITE_OTHER): Payer: Medicare PPO | Admitting: Physical Therapy

## 2021-09-16 DIAGNOSIS — M6281 Muscle weakness (generalized): Secondary | ICD-10-CM | POA: Diagnosis not present

## 2021-09-16 DIAGNOSIS — M25561 Pain in right knee: Secondary | ICD-10-CM | POA: Diagnosis not present

## 2021-09-16 DIAGNOSIS — M545 Low back pain, unspecified: Secondary | ICD-10-CM | POA: Diagnosis not present

## 2021-09-16 DIAGNOSIS — R2689 Other abnormalities of gait and mobility: Secondary | ICD-10-CM | POA: Diagnosis not present

## 2021-09-16 DIAGNOSIS — G8929 Other chronic pain: Secondary | ICD-10-CM | POA: Diagnosis not present

## 2021-09-16 NOTE — Therapy (Signed)
[facility] [address] [place], [place], [address] Phone: [phone]   Fax:  [phone]  Physical Therapy Treatment  Patient Details  Name: [name] MRN: [id number] Date of Birth: 02-11-1951 Referring Provider (PT): Donella Stade, [place]   Encounter Date: 09/16/2021   PT End of Session - 09/16/21 1410     Visit Number 3    Number of Visits 20    Date for PT Re-Evaluation 12/03/21    Authorization Type [employer]    Authorization Time Period 09/09/21-11/09/21    Authorization - Visit Number 3    Authorization - Number of Visits 12    Progress Note Due on Visit 10    PT Start Time 1300    PT Stop Time 1345    PT Time Calculation (min) 45 min    Activity Tolerance Patient tolerated treatment well    Behavior During Therapy [facility] for tasks assessed/performed             Past Medical History:  Diagnosis Date   Anxiety    Atrial fibrillation [facility])    sees Dr. Audie Box   Cancer [facility])    breast   Chronic pain    Concussion 02/2007   ICU x 3 days   Depression    Hypertension    Hypothyroidism    Personal history of chemotherapy    Personal history of radiation therapy    Spinal stenosis    Thyroid disease ?1994    Past Surgical History:  Procedure Laterality Date   BREAST BIOPSY Right 12/20/2019   x2   BREAST LUMPECTOMY Right 01/22/2020   BREAST LUMPECTOMY WITH RADIOACTIVE SEED AND SENTINEL LYMPH NODE BIOPSY Right 01/22/2020   Procedure: RIGHT BREAST LUMPECTOMY WITH RADIOACTIVE SEED X2 AND RIGHT SENTINEL LYMPH NODE MAPPING;  Surgeon: Erroll Luna, MD;  Location: [place];  Service: General;  Laterality: Right;   BREAST SURGERY Right 03/1998   breast biopsy, benign   EYE SURGERY Right    PORT-A-CATH REMOVAL Right 04/30/2021   Procedure: REMOVAL PORT-A-CATH;  Surgeon: Erroll Luna, MD;  Location: [place];  Service: General;  Laterality: Right;   PORTACATH PLACEMENT Right  01/22/2020   Procedure: INSERTION PORT-A-CATH WITH ULTRASOUND;  Surgeon: Erroll Luna, MD;  Location: [place];  Service: General;  Laterality: Right;   PORTACATH PLACEMENT Right 02/28/2020   Procedure: PORT A CATH REVISION;  Surgeon: Erroll Luna, MD;  Location: [place];  Service: General;  Laterality: Right;   REVERSE SHOULDER ARTHROPLASTY Left 02/02/2021   Procedure: LEFT REVERSE SHOULDER ARTHROPLASTY;  Surgeon: Meredith Pel, MD;  Location: [place];  Service: Orthopedics;  Laterality: Left;   SHOULDER SURGERY Right    SPINAL FUSION  03/04/2011   with ORIF    There were no vitals filed for this visit.   Subjective Assessment - 09/16/21 1311     Subjective back pain is 3/10, she is not having knee pain, she is stil concerned with her balance.    Pertinent History anxiety, breast cancer with lumpectomy and radioactive seed placement 2021 and recent port removal, chronic pain, concussion 2008, HTN, depression, spinal fusion 2012.    Patient Stated Goals improve gait and balance and back pain    Pain Onset More than a month ago    Pain Onset More than a month ago             [facility] Adult PT Treatment/Exercise - 09/16/21 0001  Neuro Re-ed    Neuro Re-ed Details  rocker board A-P 2 min, lateral 2 min. tandem walk, walking with head turns, and walking with head nods  3 round trip in bars,      Lumbar Exercises: Stretches   Lower Trunk Rotation Limitations 10 reps 5 sec    Pelvic Tilt 15 reps    Piriformis Stretch Right;Left;3 reps;30 seconds      Lumbar Exercises: Aerobic   Recumbent Bike L3 X8 min    Nustep --      Lumbar Exercises: Supine   Bridge 20 reps;3 seconds      Manual Therapy   Manual therapy comments compression and palpation during DN              Trigger Point Dry Needling - 09/16/21 0001     Consent Given? Yes    Education Handout Provided Previously provided    Muscles Treated Back/Hip Lumbar multifidi;Gluteus maximus     Dry Needling Comments good overall tolerance noted, no immediate adverse reaction                     PT Short Term Goals - 09/10/21 0852       PT SHORT TERM GOAL #1   Title independent with intial HEP    Time 4    Period Weeks    Status New    Target Date 10/08/21      PT SHORT TERM GOAL #2   Title improve TUG to less than 17 seconds    Baseline 19    Time 4    Period Weeks    Status New    Target Date 10/08/21               PT Long Term Goals - 09/10/21 0853       PT LONG TERM GOAL #1   Title She will improve BERG balance score to 46 or more to show improved balance    Baseline 41    Time 12    Period Weeks    Status New    Target Date 12/03/21      PT LONG TERM GOAL #2   Title She will improve TUG score to <14 seconds to show improved balance and walk speed.    Time 12    Period Weeks    Status New      PT LONG TERM GOAL #3   Title report back pain < 3/10 with activity for improved function    Time 12    Period Weeks    Status New      PT LONG TERM GOAL #4   Title Improve bilat knee/hip strength to 5/5 MMT tested in sitting    Baseline 4/5    Time 12    Period Weeks    Status New      PT LONG TERM GOAL #5   Title -    Baseline -      PT LONG TERM GOAL #6   Title -      PT LONG TERM GOAL #7   Title -                   Plan - 09/16/21 1410     Clinical Impression Statement She again requested copy of exercises so reprinted them again for her. She does show good understanding of how to do these in clinic. She is overall less tender to palpation in her lumbar than she  was at eval so we did perform DN today with her consent and she had good early benefit from this. We also worked to improve balance today and we will continue to work to improve this in future visits.    Personal Factors and Comorbidities Comorbidity 3+    Comorbidities anxiety, breast cancer with lumpectomy and radioactive seed placement 2021 and recent  port removal, chronic pain, concussion 2008, HTN, depression, spinal fusion 2012    Examination-Activity Limitations Locomotion Level;Reach Overhead;Dressing;Hygiene/Grooming;Lift;Toileting;Bathing    Examination-Participation Restrictions Cleaning;Meal Prep;Community Activity;Driving;Laundry    Stability/Clinical Decision Making Evolving/Moderate complexity    Rehab Potential Fair    PT Frequency 2x / week    PT Duration 12 weeks    PT Treatment/Interventions ADLs/Self Care Home Management;Therapeutic exercise;Patient/family education;Cryotherapy;DME Instruction;Ultrasound;Moist Heat;Functional mobility training;Therapeutic activities;Balance training;Neuromuscular re-education;Manual techniques;Vasopneumatic Device;Taping;Dry needling;Passive range of motion;Electrical Stimulation;Gait training    PT Next Visit Plan how was DN? focus on gait, balance, LBP,    PT Home Exercise Plan Access Code: [email or web address], added AROM flexion and abd against gravity in sitting and ER with red band    Consulted and Agree with Plan of Care Patient             Patient will benefit from skilled therapeutic intervention in order to improve the following deficits and impairments:  Postural dysfunction, Decreased range of motion, Decreased knowledge of precautions, Increased edema, Impaired UE functional use, Decreased strength, Decreased mobility, Decreased balance, Pain, Difficulty walking, Decreased activity tolerance, Decreased endurance  Visit Diagnosis: Other abnormalities of gait and mobility  Chronic bilateral low back pain without sciatica  Muscle weakness (generalized)  Acute pain of right knee     Problem List Patient Active Problem List   Diagnosis Date Noted   Arthritis of left shoulder region    S/P reverse total shoulder arthroplasty, 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   Vitamin D deficiency 01/15/2021   Vitamin B12 deficiency 01/15/2021   Primary insomnia 01/15/2021   Morbid obesity ([place]) 01/15/2021    Moderate recurrent major depression ([place]) 01/15/2021   Benzodiazepine dependence ([place]) 01/15/2021   Port-A-Cath in place 07/22/2020   Left leg cellulitis 04/17/2020   Cellulitis of left leg 04/17/2020   Hypokalemia 04/17/2020   Macrocytic anemia 04/17/2020   Anxiety 04/17/2020   Depression 04/17/2020   Chronic diarrhea 04/17/2020   Chemotherapy induced diarrhea 04/17/2020   Malignant neoplasm of upper-inner quadrant of right breast in female, estrogen receptor positive ([place]) 12/26/2019   Encephalopathy 02/02/2018   Hypothyroidism 02/02/2018   AKI (acute kidney injury) ([place]) 02/02/2018   Chronic back pain 02/02/2018   Alcohol abuse 02/02/2018   Weight gain 02/02/2018   Benign essential HTN 02/02/2018    Debbe Odea, PT,DPT 09/16/2021, 2:15 PM  [facility] [address] [place], [place], [address] Phone: [phone]   Fax:  [phone]  Name: FRAIDY MCCARRICK MRN: [id number] Date of Birth: 1951/11/10

## 2021-09-17 ENCOUNTER — Encounter: Payer: Medicare PPO | Admitting: Orthopedic Surgery

## 2021-09-17 DIAGNOSIS — E876 Hypokalemia: Secondary | ICD-10-CM | POA: Diagnosis not present

## 2021-09-17 DIAGNOSIS — R61 Generalized hyperhidrosis: Secondary | ICD-10-CM | POA: Diagnosis not present

## 2021-09-17 DIAGNOSIS — Z23 Encounter for immunization: Secondary | ICD-10-CM | POA: Diagnosis not present

## 2021-09-17 DIAGNOSIS — D649 Anemia, unspecified: Secondary | ICD-10-CM | POA: Diagnosis not present

## 2021-09-17 DIAGNOSIS — R7301 Impaired fasting glucose: Secondary | ICD-10-CM | POA: Diagnosis not present

## 2021-09-17 DIAGNOSIS — E559 Vitamin D deficiency, unspecified: Secondary | ICD-10-CM | POA: Diagnosis not present

## 2021-09-17 DIAGNOSIS — I10 Essential (primary) hypertension: Secondary | ICD-10-CM | POA: Diagnosis not present

## 2021-09-17 DIAGNOSIS — E538 Deficiency of other specified B group vitamins: Secondary | ICD-10-CM | POA: Diagnosis not present

## 2021-09-17 DIAGNOSIS — E039 Hypothyroidism, unspecified: Secondary | ICD-10-CM | POA: Diagnosis not present

## 2021-09-21 ENCOUNTER — Encounter: Payer: Self-pay | Admitting: Physical Therapy

## 2021-09-21 ENCOUNTER — Other Ambulatory Visit: Payer: Self-pay

## 2021-09-21 ENCOUNTER — Ambulatory Visit (INDEPENDENT_AMBULATORY_CARE_PROVIDER_SITE_OTHER): Payer: Medicare PPO | Admitting: Physical Therapy

## 2021-09-21 DIAGNOSIS — M25561 Pain in right knee: Secondary | ICD-10-CM | POA: Diagnosis not present

## 2021-09-21 DIAGNOSIS — M545 Low back pain, unspecified: Secondary | ICD-10-CM | POA: Diagnosis not present

## 2021-09-21 DIAGNOSIS — G8929 Other chronic pain: Secondary | ICD-10-CM

## 2021-09-21 DIAGNOSIS — M6281 Muscle weakness (generalized): Secondary | ICD-10-CM

## 2021-09-21 DIAGNOSIS — R2689 Other abnormalities of gait and mobility: Secondary | ICD-10-CM

## 2021-09-21 NOTE — Therapy (Signed)
[facility] [address] [place], [place], [address] Phone: [phone]   Fax:  [phone]  Physical Therapy Treatment  Patient Details  Name: [name] MRN: [id number] Date of Birth: [birth year] Referring Provider (PT): Donella Stade, [place]   Encounter Date: 09/21/2021   PT End of Session - 09/21/21 1445     Visit Number 4    Number of Visits 20    Date for PT Re-Evaluation 12/03/21    Authorization Type [employer]    Authorization Time Period 09/09/21-11/09/21    Authorization - Visit Number 4    Authorization - Number of Visits 12    Progress Note Due on Visit 10    PT Start Time 2876    PT Stop Time 1426    PT Time Calculation (min) 41 min    Activity Tolerance Patient tolerated treatment well    Behavior During Therapy [facility] for tasks assessed/performed             Past Medical History:  Diagnosis Date   Anxiety    Atrial fibrillation [facility])    sees Dr. Audie Box   Cancer [facility])    breast   Chronic pain    Concussion 02/2007   ICU x 3 days   Depression    Hypertension    Hypothyroidism    Personal history of chemotherapy    Personal history of radiation therapy    Spinal stenosis    Thyroid disease ?1994    Past Surgical History:  Procedure Laterality Date   BREAST BIOPSY Right 12/20/2019   x2   BREAST LUMPECTOMY Right 01/22/2020   BREAST LUMPECTOMY WITH RADIOACTIVE SEED AND SENTINEL LYMPH NODE BIOPSY Right 01/22/2020   Procedure: RIGHT BREAST LUMPECTOMY WITH RADIOACTIVE SEED X2 AND RIGHT SENTINEL LYMPH NODE MAPPING;  Surgeon: Erroll Luna, MD;  Location: [place];  Service: General;  Laterality: Right;   BREAST SURGERY Right 03/1998   breast biopsy, benign   EYE SURGERY Right    PORT-A-CATH REMOVAL Right 04/30/2021   Procedure: REMOVAL PORT-A-CATH;  Surgeon: Erroll Luna, MD;  Location: [place];  Service: General;  Laterality: Right;   PORTACATH PLACEMENT Right  01/22/2020   Procedure: INSERTION PORT-A-CATH WITH ULTRASOUND;  Surgeon: Erroll Luna, MD;  Location: [place];  Service: General;  Laterality: Right;   PORTACATH PLACEMENT Right 02/28/2020   Procedure: PORT A CATH REVISION;  Surgeon: Erroll Luna, MD;  Location: [place];  Service: General;  Laterality: Right;   REVERSE SHOULDER ARTHROPLASTY Left 02/02/2021   Procedure: LEFT REVERSE SHOULDER ARTHROPLASTY;  Surgeon: Meredith Pel, MD;  Location: [place];  Service: Orthopedics;  Laterality: Left;   SHOULDER SURGERY Right    SPINAL FUSION  03/04/2011   with ORIF    There were no vitals filed for this visit.   Subjective Assessment - 09/21/21 1348     Subjective felt DN was helpful but today her back is giving her a fit.  balance seems off today too    Pertinent History anxiety, breast cancer with lumpectomy and radioactive seed placement 2021 and recent port removal, chronic pain, concussion 2008, HTN, depression, spinal fusion 2012.    Patient Stated Goals improve gait and balance and back pain    Currently in Pain? Yes    Pain Score 4     Pain Location Back    Pain Orientation Left    Pain Descriptors / Indicators Aching;Sharp    Pain Type Acute pain  Pain Onset More than a month ago    Pain Frequency Constant    Aggravating Factors  being in one place too long    Pain Relieving Factors changing positions, heat                               [facility] Adult PT Treatment/Exercise - 09/21/21 1401       Lumbar Exercises: Aerobic   Nustep L6 x 10 min      Lumbar Exercises: Standing   Functional Squats 10 reps    Other Standing Lumbar Exercises alt hip ext and abdct x 10 reps each with bil UE support    Other Standing Lumbar Exercises SLS 5x10 sec hold with bil UE support; tandem stance 3x10 sec bil      Lumbar Exercises: Seated   Sit to Stand 10 reps              Trigger Point Dry Needling - 09/21/21 1401     Consent Given? Yes     Education Handout Provided Previously provided    Muscles Treated Back/Hip Quadratus lumborum;Erector spinae    Erector spinae Response Twitch response elicited    Quadratus Lumborum Response Twitch response elicited                     PT Short Term Goals - 09/10/21 0852       PT SHORT TERM GOAL #1   Title independent with intial HEP    Time 4    Period Weeks    Status New    Target Date 10/08/21      PT SHORT TERM GOAL #2   Title improve TUG to less than 17 seconds    Baseline 19    Time 4    Period Weeks    Status New    Target Date 10/08/21               PT Long Term Goals - 09/10/21 0853       PT LONG TERM GOAL #1   Title She will improve BERG balance score to 46 or more to show improved balance    Baseline 41    Time 12    Period Weeks    Status New    Target Date 12/03/21      PT LONG TERM GOAL #2   Title She will improve TUG score to <14 seconds to show improved balance and walk speed.    Time 12    Period Weeks    Status New      PT LONG TERM GOAL #3   Title report back pain < 3/10 with activity for improved function    Time 12    Period Weeks    Status New      PT LONG TERM GOAL #4   Title Improve bilat knee/hip strength to 5/5 MMT tested in sitting    Baseline 4/5    Time 12    Period Weeks    Status New      PT LONG TERM GOAL #5   Title -    Baseline -      PT LONG TERM GOAL #6   Title -      PT LONG TERM GOAL #7   Title -                   Plan - 09/21/21 1446  Clinical Impression Statement Trial of DN to lumbar paraspinals and QL on Lt side today as pain seemed to be more lateral today.  Session also focused on strengthening and balance exercises with 2-3 LOB and pt able to self correct.  Will continue to benefit from PT to maximize function.    Personal Factors and Comorbidities Comorbidity 3+    Comorbidities anxiety, breast cancer with lumpectomy and radioactive seed placement 2021 and recent  port removal, chronic pain, concussion 2008, HTN, depression, spinal fusion 2012    Examination-Activity Limitations Locomotion Level;Reach Overhead;Dressing;Hygiene/Grooming;Lift;Toileting;Bathing    Examination-Participation Restrictions Cleaning;Meal Prep;Community Activity;Driving;Laundry    Stability/Clinical Decision Making Evolving/Moderate complexity    Rehab Potential Fair    PT Frequency 2x / week    PT Duration 12 weeks    PT Treatment/Interventions ADLs/Self Care Home Management;Therapeutic exercise;Patient/family education;Cryotherapy;DME Instruction;Ultrasound;Moist Heat;Functional mobility training;Therapeutic activities;Balance training;Neuromuscular re-education;Manual techniques;Vasopneumatic Device;Taping;Dry needling;Passive range of motion;Electrical Stimulation;Gait training    PT Next Visit Plan assess response to DN, focus on gait, balance, LBP,    PT Home Exercise Plan Access Code: [email or web address], added AROM flexion and abd against gravity in sitting and ER with red band    Consulted and Agree with Plan of Care Patient             Patient will benefit from skilled therapeutic intervention in order to improve the following deficits and impairments:  Postural dysfunction, Decreased range of motion, Decreased knowledge of precautions, Increased edema, Impaired UE functional use, Decreased strength, Decreased mobility, Decreased balance, Pain, Difficulty walking, Decreased activity tolerance, Decreased endurance  Visit Diagnosis: Other abnormalities of gait and mobility  Chronic bilateral low back pain without sciatica  Muscle weakness (generalized)  Acute pain of right knee     Problem List Patient Active Problem List   Diagnosis Date Noted   Arthritis of left shoulder region    S/P reverse total shoulder arthroplasty, 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   Vitamin D deficiency 01/15/2021   Vitamin B12 deficiency 01/15/2021   Primary insomnia 01/15/2021   Morbid obesity ([place])  01/15/2021   Moderate recurrent major depression ([place]) 01/15/2021   Benzodiazepine dependence ([place]) 01/15/2021   Port-A-Cath in place 07/22/2020   Left leg cellulitis 04/17/2020   Cellulitis of left leg 04/17/2020   Hypokalemia 04/17/2020   Macrocytic anemia 04/17/2020   Anxiety 04/17/2020   Depression 04/17/2020   Chronic diarrhea 04/17/2020   Chemotherapy induced diarrhea 04/17/2020   Malignant neoplasm of upper-inner quadrant of right breast in female, estrogen receptor positive ([place]) 12/26/2019   Encephalopathy 02/02/2018   Hypothyroidism 02/02/2018   AKI (acute kidney injury) ([place]) 02/02/2018   Chronic back pain 02/02/2018   Alcohol abuse 02/02/2018   Weight gain 02/02/2018   Benign essential HTN 02/02/2018     Laureen Abrahams, PT, DPT 09/21/21 2:48 PM     [facility] [address] [place], [place], [address] Phone: [phone]   Fax:  [phone]  Name: KRISTELLE CAVALLARO MRN: [id number] Date of Birth: 06/11/1951

## 2021-09-23 ENCOUNTER — Other Ambulatory Visit: Payer: Self-pay

## 2021-09-23 ENCOUNTER — Ambulatory Visit (INDEPENDENT_AMBULATORY_CARE_PROVIDER_SITE_OTHER): Payer: Medicare PPO | Admitting: Physical Therapy

## 2021-09-23 DIAGNOSIS — R2689 Other abnormalities of gait and mobility: Secondary | ICD-10-CM

## 2021-09-23 DIAGNOSIS — M6281 Muscle weakness (generalized): Secondary | ICD-10-CM | POA: Diagnosis not present

## 2021-09-23 DIAGNOSIS — G8929 Other chronic pain: Secondary | ICD-10-CM | POA: Diagnosis not present

## 2021-09-23 DIAGNOSIS — M25561 Pain in right knee: Secondary | ICD-10-CM

## 2021-09-23 DIAGNOSIS — M545 Low back pain, unspecified: Secondary | ICD-10-CM | POA: Diagnosis not present

## 2021-09-23 NOTE — Therapy (Signed)
[facility] Physical Therapy [address] [place], [place], [address] Phone: [phone]   Fax:  [phone]  Physical Therapy Treatment  Patient Details  Name: [name] MRN: [id number] Date of Birth: 1951-07-02 Referring Provider (PT): Donella Stade, [place]   Encounter Date: 09/23/2021   PT End of Session - 09/23/21 1430     Visit Number 5    Number of Visits 20    Date for PT Re-Evaluation 12/03/21    Authorization Type [employer]    Authorization Time Period 09/09/21-11/09/21    Authorization - Visit Number 5    Authorization - Number of Visits 12    Progress Note Due on Visit 10    PT Start Time 4270    PT Stop Time 1423    PT Time Calculation (min) 38 min    Activity Tolerance Patient tolerated treatment well    Behavior During Therapy [facility] for tasks assessed/performed             Past Medical History:  Diagnosis Date   Anxiety    Atrial fibrillation [facility])    sees Dr. Audie Box   Cancer [facility])    breast   Chronic pain    Concussion 02/2007   ICU x 3 days   Depression    Hypertension    Hypothyroidism    Personal history of chemotherapy    Personal history of radiation therapy    Spinal stenosis    Thyroid disease ?1994    Past Surgical History:  Procedure Laterality Date   BREAST BIOPSY Right 12/20/2019   x2   BREAST LUMPECTOMY Right 01/22/2020   BREAST LUMPECTOMY WITH RADIOACTIVE SEED AND SENTINEL LYMPH NODE BIOPSY Right 01/22/2020   Procedure: RIGHT BREAST LUMPECTOMY WITH RADIOACTIVE SEED X2 AND RIGHT SENTINEL LYMPH NODE MAPPING;  Surgeon: Erroll Luna, MD;  Location: [place];  Service: General;  Laterality: Right;   BREAST SURGERY Right 03/1998   breast biopsy, benign   EYE SURGERY Right    PORT-A-CATH REMOVAL Right 04/30/2021   Procedure: REMOVAL PORT-A-CATH;  Surgeon: Erroll Luna, MD;  Location: [place];  Service: General;  Laterality: Right;   PORTACATH PLACEMENT Right  01/22/2020   Procedure: INSERTION PORT-A-CATH WITH ULTRASOUND;  Surgeon: Erroll Luna, MD;  Location: [place];  Service: General;  Laterality: Right;   PORTACATH PLACEMENT Right 02/28/2020   Procedure: PORT A CATH REVISION;  Surgeon: Erroll Luna, MD;  Location: [place];  Service: General;  Laterality: Right;   REVERSE SHOULDER ARTHROPLASTY Left 02/02/2021   Procedure: LEFT REVERSE SHOULDER ARTHROPLASTY;  Surgeon: Meredith Pel, MD;  Location: [place];  Service: Orthopedics;  Laterality: Left;   SHOULDER SURGERY Right    SPINAL FUSION  03/04/2011   with ORIF    There were no vitals filed for this visit.   Subjective Assessment - 09/23/21 1414     Subjective relays she wants to hold off on DN this visit and work on her balance and leg strength. She relays 4/10 overall back pain today.    Pertinent History anxiety, breast cancer with lumpectomy and radioactive seed placement 2021 and recent port removal, chronic pain, concussion 2008, HTN, depression, spinal fusion 2012.    Patient Stated Goals improve gait and balance and back pain    Pain Onset More than a month ago    Pain Onset More than a month ago               [facility] Adult  PT Treatment/Exercise - 09/23/21 0001       Lumbar Exercises: Aerobic   Nustep L6 x 11 min      Lumbar Exercises: Machines for Strengthening   Leg Press DL 75# X20, SL 43# X20 ea side      Lumbar Exercises: Standing   Other Standing Lumbar Exercises 4 round trips each at countertop for lateral walking, retrowalking, walking with head turns and walking with head nods. 2 round trips weaving through 5 cones fwd, and lateral.              PT Short Term Goals - 09/10/21 0852       PT SHORT TERM GOAL #1   Title independent with intial HEP    Time 4    Period Weeks    Status New    Target Date 10/08/21      PT SHORT TERM GOAL #2   Title improve TUG to less than 17 seconds    Baseline 19    Time 4    Period Weeks     Status New    Target Date 10/08/21               PT Long Term Goals - 09/10/21 0853       PT LONG TERM GOAL #1   Title She will improve BERG balance score to 46 or more to show improved balance    Baseline 41    Time 12    Period Weeks    Status New    Target Date 12/03/21      PT LONG TERM GOAL #2   Title She will improve TUG score to <14 seconds to show improved balance and walk speed.    Time 12    Period Weeks    Status New      PT LONG TERM GOAL #3   Title report back pain < 3/10 with activity for improved function    Time 12    Period Weeks    Status New      PT LONG TERM GOAL #4   Title Improve bilat knee/hip strength to 5/5 MMT tested in sitting    Baseline 4/5    Time 12    Period Weeks    Status New      PT LONG TERM GOAL #5   Title -    Baseline -      PT LONG TERM GOAL #6   Title -      PT LONG TERM GOAL #7   Title -                   Plan - 09/23/21 1430     Clinical Impression Statement Session focused on balance, standing tolerance, overall endurance, and leg strength today with good tolerance and without increased pain. She will be at PT only one time next week due to cataract surgery.    Personal Factors and Comorbidities Comorbidity 3+    Comorbidities anxiety, breast cancer with lumpectomy and radioactive seed placement 2021 and recent port removal, chronic pain, concussion 2008, HTN, depression, spinal fusion 2012    Examination-Activity Limitations Locomotion Level;Reach Overhead;Dressing;Hygiene/Grooming;Lift;Toileting;Bathing    Examination-Participation Restrictions Cleaning;Meal Prep;Community Activity;Driving;Laundry    Stability/Clinical Decision Making Evolving/Moderate complexity    Rehab Potential Fair    PT Frequency 2x / week    PT Duration 12 weeks    PT Treatment/Interventions ADLs/Self Care Home Management;Therapeutic exercise;Patient/family education;Cryotherapy;DME Instruction;Ultrasound;Moist Heat;Functional  mobility training;Therapeutic activities;Balance training;Neuromuscular re-education;Manual  techniques;Vasopneumatic Device;Taping;Dry needling;Passive range of motion;Electrical Stimulation;Gait training    PT Next Visit Plan progress gait without cane as able, balance, LBP,    PT Home Exercise Plan Access Code: [email or web address], added AROM flexion and abd against gravity in sitting and ER with red band    Consulted and Agree with Plan of Care Patient             Patient will benefit from skilled therapeutic intervention in order to improve the following deficits and impairments:  Postural dysfunction, Decreased range of motion, Decreased knowledge of precautions, Increased edema, Impaired UE functional use, Decreased strength, Decreased mobility, Decreased balance, Pain, Difficulty walking, Decreased activity tolerance, Decreased endurance  Visit Diagnosis: Other abnormalities of gait and mobility  Chronic bilateral low back pain without sciatica  Muscle weakness (generalized)  Acute pain of right knee     Problem List Patient Active Problem List   Diagnosis Date Noted   Arthritis of left shoulder region    S/P reverse total shoulder arthroplasty, 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   Vitamin D deficiency 01/15/2021   Vitamin B12 deficiency 01/15/2021   Primary insomnia 01/15/2021   Morbid obesity ([place]) 01/15/2021   Moderate recurrent major depression ([place]) 01/15/2021   Benzodiazepine dependence ([place]) 01/15/2021   Port-A-Cath in place 07/22/2020   Left leg cellulitis 04/17/2020   Cellulitis of left leg 04/17/2020   Hypokalemia 04/17/2020   Macrocytic anemia 04/17/2020   Anxiety 04/17/2020   Depression 04/17/2020   Chronic diarrhea 04/17/2020   Chemotherapy induced diarrhea 04/17/2020   Malignant neoplasm of upper-inner quadrant of right breast in female, estrogen receptor positive ([place]) 12/26/2019   Encephalopathy 02/02/2018   Hypothyroidism 02/02/2018   AKI (acute kidney injury) ([place])  02/02/2018   Chronic back pain 02/02/2018   Alcohol abuse 02/02/2018   Weight gain 02/02/2018   Benign essential HTN 02/02/2018    Debbe Odea, PT,DPT 09/23/2021, 2:33 PM  [facility] Physical Therapy [address] [place], [place], [address] Phone: [phone]   Fax:  [phone]  Name: [name] MRN: [id number] Date of Birth: September 07, 1951

## 2021-09-28 ENCOUNTER — Other Ambulatory Visit: Payer: Self-pay | Admitting: Hematology and Oncology

## 2021-09-28 ENCOUNTER — Encounter: Payer: Medicare PPO | Admitting: Physical Therapy

## 2021-09-29 DIAGNOSIS — H2512 Age-related nuclear cataract, left eye: Secondary | ICD-10-CM | POA: Diagnosis not present

## 2021-09-30 ENCOUNTER — Encounter: Payer: Medicare PPO | Admitting: Physical Therapy

## 2021-10-05 ENCOUNTER — Encounter: Payer: Medicare PPO | Admitting: Physical Therapy

## 2021-10-07 ENCOUNTER — Other Ambulatory Visit: Payer: Self-pay

## 2021-10-07 ENCOUNTER — Ambulatory Visit (INDEPENDENT_AMBULATORY_CARE_PROVIDER_SITE_OTHER): Payer: Medicare PPO | Admitting: Physical Therapy

## 2021-10-07 DIAGNOSIS — G8929 Other chronic pain: Secondary | ICD-10-CM | POA: Diagnosis not present

## 2021-10-07 DIAGNOSIS — M25561 Pain in right knee: Secondary | ICD-10-CM | POA: Diagnosis not present

## 2021-10-07 DIAGNOSIS — M6281 Muscle weakness (generalized): Secondary | ICD-10-CM

## 2021-10-07 DIAGNOSIS — M545 Low back pain, unspecified: Secondary | ICD-10-CM

## 2021-10-07 DIAGNOSIS — R2689 Other abnormalities of gait and mobility: Secondary | ICD-10-CM

## 2021-10-07 NOTE — Therapy (Signed)
[facility] Physical Therapy [address] [place], [place], [address] Phone: [phone]   Fax:  [phone]  Physical Therapy Treatment  Patient Details  Name: [name] MRN: [id number] Date of Birth: 25-Mar-1951 Referring Provider (PT): Donella Stade, [place]   Encounter Date: 10/07/2021   PT End of Session - 10/07/21 1333     Visit Number 6    Number of Visits 20    Date for PT Re-Evaluation 12/03/21    Authorization Type [employer]    Authorization Time Period 09/09/21-11/09/21    Authorization - Visit Number 6    Authorization - Number of Visits 12    Progress Note Due on Visit 10    PT Start Time 1301    PT Stop Time 1345    PT Time Calculation (min) 44 min    Activity Tolerance Patient tolerated treatment well    Behavior During Therapy [facility] for tasks assessed/performed             Past Medical History:  Diagnosis Date   Anxiety    Atrial fibrillation [facility])    sees Dr. Audie Box   Cancer [facility])    breast   Chronic pain    Concussion 02/2007   ICU x 3 days   Depression    Hypertension    Hypothyroidism    Personal history of chemotherapy    Personal history of radiation therapy    Spinal stenosis    Thyroid disease ?1994    Past Surgical History:  Procedure Laterality Date   BREAST BIOPSY Right 12/20/2019   x2   BREAST LUMPECTOMY Right 01/22/2020   BREAST LUMPECTOMY WITH RADIOACTIVE SEED AND SENTINEL LYMPH NODE BIOPSY Right 01/22/2020   Procedure: RIGHT BREAST LUMPECTOMY WITH RADIOACTIVE SEED X2 AND RIGHT SENTINEL LYMPH NODE MAPPING;  Surgeon: Erroll Luna, MD;  Location: [place];  Service: General;  Laterality: Right;   BREAST SURGERY Right 03/1998   breast biopsy, benign   EYE SURGERY Right    PORT-A-CATH REMOVAL Right 04/30/2021   Procedure: REMOVAL PORT-A-CATH;  Surgeon: Erroll Luna, MD;  Location: [place];  Service: General;  Laterality: Right;   PORTACATH PLACEMENT Right  01/22/2020   Procedure: INSERTION PORT-A-CATH WITH ULTRASOUND;  Surgeon: Erroll Luna, MD;  Location: [place];  Service: General;  Laterality: Right;   PORTACATH PLACEMENT Right 02/28/2020   Procedure: PORT A CATH REVISION;  Surgeon: Erroll Luna, MD;  Location: [place];  Service: General;  Laterality: Right;   REVERSE SHOULDER ARTHROPLASTY Left 02/02/2021   Procedure: LEFT REVERSE SHOULDER ARTHROPLASTY;  Surgeon: Meredith Pel, MD;  Location: [place];  Service: Orthopedics;  Laterality: Left;   SHOULDER SURGERY Right    SPINAL FUSION  03/04/2011   with ORIF    There were no vitals filed for this visit.   Subjective Assessment - 10/07/21 1314     Subjective relays she is doing better with walking and has been working on walking backwards at home and going up curbs, however does have more back pain today about 6/10 overall.    Pertinent History anxiety, breast cancer with lumpectomy and radioactive seed placement 2021 and recent port removal, chronic pain, concussion 2008, HTN, depression, spinal fusion 2012.    Patient Stated Goals improve gait and balance and back pain    Pain Onset More than a month ago    Pain Onset More than a month ago  [facility] PT Assessment - 10/07/21 0001       Assessment   Medical Diagnosis LBP, Lt knee pain and weakness from Stress Fx,    Referring Provider (PT) Magnant, Gerrianne Scale, PA-C      Strength   Overall Strength Comments hip strength grossly 4/5, knee strength now 4+ for extension and 5 for flexion      Standardized Balance Assessment   Standardized Balance Assessment Five Times Sit to Stand    Five times sit to stand comments  12.3      Timed Up and Go Test   Normal TUG (seconds) 11.95    TUG Comments without AD                           [facility] Adult PT Treatment/Exercise - 10/07/21 0001       Neuro Re-ed    Neuro Re-ed Details  rocker board A-P 2 min, lateral 2 min. tandem  balance on foam pad 30 sec X2 bilat, step ups onto foam pad without UE support X10 bilat,      Lumbar Exercises: Aerobic   Nustep L6 X15 min      Lumbar Exercises: Machines for Strengthening   Leg Press DL 87# X30, SL 50# X20 ea side      Lumbar Exercises: Seated   Long Arc Quad on Chair Both;2 sets;15 reps    LAQ on Chair Weights (lbs) 5    Other Seated Lumbar Exercises sit to stands 2X5 reps without UE support                       PT Short Term Goals - 10/07/21 1353       PT SHORT TERM GOAL #1   Title independent with intial HEP    Baseline met 10/19 but continue to progress    Time 4    Period Weeks    Status Achieved    Target Date 10/08/21      PT SHORT TERM GOAL #2   Title improve TUG to less than 17 seconds    Baseline Met 10/19, will make new goal for this    Time 4    Period Weeks    Status Achieved    Target Date 10/08/21      PT SHORT TERM GOAL #3   Title Pt will improve TUG time to less than 11 seconds without AD to improve gait speed and balance.    Time 4    Period Weeks    Status New    Target Date 11/04/21               PT Long Term Goals - 10/07/21 1355       PT LONG TERM GOAL #1   Title She will improve BERG balance score to 46 or more to show improved balance    Baseline 41    Time 12    Period Weeks    Status On-going      PT LONG TERM GOAL #2   Title She will improve TUG score to <10 seconds  to show improved balance and walk speed.    Baseline met 10/19, wrote new goal for this    Period Weeks    Status New      PT LONG TERM GOAL #3   Title report back pain < 3/10 with activity for improved function    Time 12  Period Weeks    Status On-going      PT LONG TERM GOAL #4   Title Improve bilat knee/hip strength to 5/5 MMT tested in sitting    Baseline now 4 to 4+ on 10/19    Time 12    Period Weeks    Status On-going      PT LONG TERM GOAL #5   Title -    Baseline -      PT LONG TERM GOAL #6   Title -       PT LONG TERM GOAL #7   Title -                   Plan - 10/07/21 1348     Clinical Impression Statement She demonstrated overall good improvments in overall leg strength, balance and gait with her updated measurements, see above. We will continue to work to improve these impairments as tolerated.    Personal Factors and Comorbidities Comorbidity 3+    Comorbidities anxiety, breast cancer with lumpectomy and radioactive seed placement 2021 and recent port removal, chronic pain, concussion 2008, HTN, depression, spinal fusion 2012    Examination-Activity Limitations Locomotion Level;Reach Overhead;Dressing;Hygiene/Grooming;Lift;Toileting;Bathing    Examination-Participation Restrictions Cleaning;Meal Prep;Community Activity;Driving;Laundry    Stability/Clinical Decision Making Evolving/Moderate complexity    Rehab Potential Fair    PT Frequency 2x / week    PT Duration 12 weeks    PT Treatment/Interventions ADLs/Self Care Home Management;Therapeutic exercise;Patient/family education;Cryotherapy;DME Instruction;Ultrasound;Moist Heat;Functional mobility training;Therapeutic activities;Balance training;Neuromuscular re-education;Manual techniques;Vasopneumatic Device;Taping;Dry needling;Passive range of motion;Electrical Stimulation;Gait training    PT Next Visit Plan progress gait without cane as able, balance, leg strength, endurance    PT Home Exercise Plan Access Code: [email or web address], added AROM flexion and abd against gravity in sitting and ER with red band    Consulted and Agree with Plan of Care Patient             Patient will benefit from skilled therapeutic intervention in order to improve the following deficits and impairments:  Postural dysfunction, Decreased range of motion, Decreased knowledge of precautions, Increased edema, Impaired UE functional use, Decreased strength, Decreased mobility, Decreased balance, Pain, Difficulty walking, Decreased activity tolerance,  Decreased endurance  Visit Diagnosis: Other abnormalities of gait and mobility  Chronic bilateral low back pain without sciatica  Muscle weakness (generalized)  Acute pain of right knee     Problem List Patient Active Problem List   Diagnosis Date Noted   Arthritis of left shoulder region    S/P reverse total shoulder arthroplasty, 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   Vitamin D deficiency 01/15/2021   Vitamin B12 deficiency 01/15/2021   Primary insomnia 01/15/2021   Morbid obesity ([place]) 01/15/2021   Moderate recurrent major depression ([place]) 01/15/2021   Benzodiazepine dependence ([place]) 01/15/2021   Port-A-Cath in place 07/22/2020   Left leg cellulitis 04/17/2020   Cellulitis of left leg 04/17/2020   Hypokalemia 04/17/2020   Macrocytic anemia 04/17/2020   Anxiety 04/17/2020   Depression 04/17/2020   Chronic diarrhea 04/17/2020   Chemotherapy induced diarrhea 04/17/2020   Malignant neoplasm of upper-inner quadrant of right breast in female, estrogen receptor positive ([place]) 12/26/2019   Encephalopathy 02/02/2018   Hypothyroidism 02/02/2018   AKI (acute kidney injury) ([place]) 02/02/2018   Chronic back pain 02/02/2018   Alcohol abuse 02/02/2018   Weight gain 02/02/2018   Benign essential HTN 02/02/2018    Debbe Odea, PT,DPT 10/07/2021, 1:56 PM  [facility] Physical Therapy [address] [place], [place], [address]  Phone: [phone]   Fax:  [phone]  Name: [name] MRN: [id number] Date of Birth: [birth year]

## 2021-10-12 ENCOUNTER — Ambulatory Visit (INDEPENDENT_AMBULATORY_CARE_PROVIDER_SITE_OTHER): Payer: Medicare PPO | Admitting: Physical Therapy

## 2021-10-12 ENCOUNTER — Other Ambulatory Visit: Payer: Self-pay

## 2021-10-12 DIAGNOSIS — M25561 Pain in right knee: Secondary | ICD-10-CM

## 2021-10-12 DIAGNOSIS — M545 Low back pain, unspecified: Secondary | ICD-10-CM

## 2021-10-12 DIAGNOSIS — R2689 Other abnormalities of gait and mobility: Secondary | ICD-10-CM | POA: Diagnosis not present

## 2021-10-12 DIAGNOSIS — M6281 Muscle weakness (generalized): Secondary | ICD-10-CM | POA: Diagnosis not present

## 2021-10-12 DIAGNOSIS — G8929 Other chronic pain: Secondary | ICD-10-CM

## 2021-10-12 NOTE — Therapy (Signed)
[facility] [address] [place], [place], [address] Phone: [phone]   Fax:  [phone]  Physical Therapy Treatment  Patient Details  Name: [name] MRN: [id number] Date of Birth: April 05, 1951 Referring Provider (PT): Donella Stade, [place]   Encounter Date: 10/12/2021   PT End of Session - 10/12/21 1353     Visit Number 7    Number of Visits 20    Date for PT Re-Evaluation 12/03/21    Authorization Type [employer]    Authorization Time Period 09/09/21-11/09/21    Authorization - Visit Number 7    Authorization - Number of Visits 12    Progress Note Due on Visit 10    PT Start Time 1301    PT Stop Time 6384    PT Time Calculation (min) 46 min    Activity Tolerance Patient tolerated treatment well    Behavior During Therapy [facility] for tasks assessed/performed             Past Medical History:  Diagnosis Date   Anxiety    Atrial fibrillation [facility])    sees Dr. Audie Box   Cancer [facility])    breast   Chronic pain    Concussion 02/2007   ICU x 3 days   Depression    Hypertension    Hypothyroidism    Personal history of chemotherapy    Personal history of radiation therapy    Spinal stenosis    Thyroid disease ?1994    Past Surgical History:  Procedure Laterality Date   BREAST BIOPSY Right 12/20/2019   x2   BREAST LUMPECTOMY Right 01/22/2020   BREAST LUMPECTOMY WITH RADIOACTIVE SEED AND SENTINEL LYMPH NODE BIOPSY Right 01/22/2020   Procedure: RIGHT BREAST LUMPECTOMY WITH RADIOACTIVE SEED X2 AND RIGHT SENTINEL LYMPH NODE MAPPING;  Surgeon: Erroll Luna, MD;  Location: [place];  Service: General;  Laterality: Right;   BREAST SURGERY Right 03/1998   breast biopsy, benign   EYE SURGERY Right    PORT-A-CATH REMOVAL Right 04/30/2021   Procedure: REMOVAL PORT-A-CATH;  Surgeon: Erroll Luna, MD;  Location: [place];  Service: General;  Laterality: Right;   PORTACATH PLACEMENT Right  01/22/2020   Procedure: INSERTION PORT-A-CATH WITH ULTRASOUND;  Surgeon: Erroll Luna, MD;  Location: [place];  Service: General;  Laterality: Right;   PORTACATH PLACEMENT Right 02/28/2020   Procedure: PORT A CATH REVISION;  Surgeon: Erroll Luna, MD;  Location: [place];  Service: General;  Laterality: Right;   REVERSE SHOULDER ARTHROPLASTY Left 02/02/2021   Procedure: LEFT REVERSE SHOULDER ARTHROPLASTY;  Surgeon: Meredith Pel, MD;  Location: [place];  Service: Orthopedics;  Laterality: Left;   SHOULDER SURGERY Right    SPINAL FUSION  03/04/2011   with ORIF    There were no vitals filed for this visit.   Subjective Assessment - 10/12/21 1311     Subjective She relays 5/10 overall back pain today.    Pertinent History anxiety, breast cancer with lumpectomy and radioactive seed placement 2021 and recent port removal, chronic pain, concussion 2008, HTN, depression, spinal fusion 2012.    Patient Stated Goals improve gait and balance and back pain    Pain Onset More than a month ago    Pain Onset More than a month ago              [facility] Adult PT Treatment/Exercise - 10/12/21 0001       Neuro Re-ed    Neuro Re-ed Details  rocker board A-P 1 min, lateral 1 min. tandem balance on foam pad 30 sec X2 bilat, step ups onto foam pad without UE support X10 bilat,      Lumbar Exercises: Aerobic   Nustep L6 X15 min      Lumbar Exercises: Machines for Strengthening   Leg Press DL 87# X30, SL 50# X20 ea side      Manual Therapy   Manual therapy comments compression and skilled palpation during DN              Trigger Point Dry Needling - 10/12/21 0001     Consent Given? Yes    Education Handout Provided Previously provided    Muscles Treated Back/Hip Quadratus lumborum;Erector spinae;Lumbar multifidi    Dry Needling Comments good overall tolerance noted, no immediate adverse reaction                     PT Short Term Goals - 10/07/21 1353        PT SHORT TERM GOAL #1   Title independent with intial HEP    Baseline met 10/19 but continue to progress    Time 4    Period Weeks    Status Achieved    Target Date 10/08/21      PT SHORT TERM GOAL #2   Title improve TUG to less than 17 seconds    Baseline Met 10/19, will make new goal for this    Time 4    Period Weeks    Status Achieved    Target Date 10/08/21      PT SHORT TERM GOAL #3   Title Pt will improve TUG time to less than 11 seconds without AD to improve gait speed and balance.    Time 4    Period Weeks    Status New    Target Date 11/04/21               PT Long Term Goals - 10/07/21 1355       PT LONG TERM GOAL #1   Title She will improve BERG balance score to 46 or more to show improved balance    Baseline 41    Time 12    Period Weeks    Status On-going      PT LONG TERM GOAL #2   Title She will improve TUG score to <10 seconds  to show improved balance and walk speed.    Baseline met 10/19, wrote new goal for this    Period Weeks    Status New      PT LONG TERM GOAL #3   Title report back pain < 3/10 with activity for improved function    Time 12    Period Weeks    Status On-going      PT LONG TERM GOAL #4   Title Improve bilat knee/hip strength to 5/5 MMT tested in sitting    Baseline now 4 to 4+ on 10/19    Time 12    Period Weeks    Status On-going      PT LONG TERM GOAL #5   Title -    Baseline -      PT LONG TERM GOAL #6   Title -      PT LONG TERM GOAL #7   Title -                   Plan - 10/12/21 1328  Clinical Impression Statement She ambulates into clinic today without her SPC due to reports of she is doing much better and does not need it. We continued to work to improce her balance and overall leg strength. She was limited a little by LBP and requested DN for this and she had good overall tolerance to this intervention.    Personal Factors and Comorbidities Comorbidity 3+    Comorbidities  anxiety, breast cancer with lumpectomy and radioactive seed placement 2021 and recent port removal, chronic pain, concussion 2008, HTN, depression, spinal fusion 2012    Examination-Activity Limitations Locomotion Level;Reach Overhead;Dressing;Hygiene/Grooming;Lift;Toileting;Bathing    Examination-Participation Restrictions Cleaning;Meal Prep;Community Activity;Driving;Laundry    Stability/Clinical Decision Making Evolving/Moderate complexity    Rehab Potential Fair    PT Frequency 2x / week    PT Duration 12 weeks    PT Treatment/Interventions ADLs/Self Care Home Management;Therapeutic exercise;Patient/family education;Cryotherapy;DME Instruction;Ultrasound;Moist Heat;Functional mobility training;Therapeutic activities;Balance training;Neuromuscular re-education;Manual techniques;Vasopneumatic Device;Taping;Dry needling;Passive range of motion;Electrical Stimulation;Gait training    PT Next Visit Plan how was DN? progress gait  balance, leg strength, endurance    PT Home Exercise Plan Access Code: [email or web address], added AROM flexion and abd against gravity in sitting and ER with red band    Consulted and Agree with Plan of Care Patient             Patient will benefit from skilled therapeutic intervention in order to improve the following deficits and impairments:  Postural dysfunction, Decreased range of motion, Decreased knowledge of precautions, Increased edema, Impaired UE functional use, Decreased strength, Decreased mobility, Decreased balance, Pain, Difficulty walking, Decreased activity tolerance, Decreased endurance  Visit Diagnosis: Other abnormalities of gait and mobility  Chronic bilateral low back pain without sciatica  Muscle weakness (generalized)  Acute pain of right knee     Problem List Patient Active Problem List   Diagnosis Date Noted   Arthritis of left shoulder region    S/P reverse total shoulder arthroplasty, 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   Vitamin D deficiency 01/15/2021    Vitamin B12 deficiency 01/15/2021   Primary insomnia 01/15/2021   Morbid obesity ([place]) 01/15/2021   Moderate recurrent major depression ([place]) 01/15/2021   Benzodiazepine dependence ([place]) 01/15/2021   Port-A-Cath in place 07/22/2020   Left leg cellulitis 04/17/2020   Cellulitis of left leg 04/17/2020   Hypokalemia 04/17/2020   Macrocytic anemia 04/17/2020   Anxiety 04/17/2020   Depression 04/17/2020   Chronic diarrhea 04/17/2020   Chemotherapy induced diarrhea 04/17/2020   Malignant neoplasm of upper-inner quadrant of right breast in female, estrogen receptor positive ([place]) 12/26/2019   Encephalopathy 02/02/2018   Hypothyroidism 02/02/2018   AKI (acute kidney injury) ([place]) 02/02/2018   Chronic back pain 02/02/2018   Alcohol abuse 02/02/2018   Weight gain 02/02/2018   Benign essential HTN 02/02/2018    Debbe Odea, PT,DPT 10/12/2021, 1:55 PM  [facility] [address] [place], [place], [address] Phone: [phone]   Fax:  [phone]  Name: ALMADELIA LOOMAN MRN: [id number] Date of Birth: 09/19/1951

## 2021-10-14 ENCOUNTER — Ambulatory Visit (INDEPENDENT_AMBULATORY_CARE_PROVIDER_SITE_OTHER): Payer: Medicare PPO | Admitting: Physical Therapy

## 2021-10-14 ENCOUNTER — Other Ambulatory Visit: Payer: Self-pay

## 2021-10-14 DIAGNOSIS — M25561 Pain in right knee: Secondary | ICD-10-CM

## 2021-10-14 DIAGNOSIS — R2689 Other abnormalities of gait and mobility: Secondary | ICD-10-CM | POA: Diagnosis not present

## 2021-10-14 DIAGNOSIS — M545 Low back pain, unspecified: Secondary | ICD-10-CM

## 2021-10-14 DIAGNOSIS — G8929 Other chronic pain: Secondary | ICD-10-CM | POA: Diagnosis not present

## 2021-10-14 DIAGNOSIS — M6281 Muscle weakness (generalized): Secondary | ICD-10-CM

## 2021-10-14 NOTE — Therapy (Signed)
[facility] [address] [place], [place], [address] Phone: [phone]   Fax:  [phone]  Physical Therapy Treatment  Patient Details  Name: [name] MRN: [id number] Date of Birth: 1951/12/04 Referring Provider (PT): Donella Stade, [place]   Encounter Date: 10/14/2021   PT End of Session - 10/14/21 1329     Visit Number 8    Number of Visits 20    Date for PT Re-Evaluation 12/03/21    Authorization Type [employer]    Authorization Time Period 09/09/21-11/09/21    Authorization - Visit Number 8    Authorization - Number of Visits 12    Progress Note Due on Visit 10    PT Start Time 1304    PT Stop Time 1352    PT Time Calculation (min) 48 min    Activity Tolerance Patient tolerated treatment well    Behavior During Therapy [facility] for tasks assessed/performed             Past Medical History:  Diagnosis Date   Anxiety    Atrial fibrillation [facility])    sees Dr. Audie Box   Cancer [facility])    breast   Chronic pain    Concussion 02/2007   ICU x 3 days   Depression    Hypertension    Hypothyroidism    Personal history of chemotherapy    Personal history of radiation therapy    Spinal stenosis    Thyroid disease ?1994    Past Surgical History:  Procedure Laterality Date   BREAST BIOPSY Right 12/20/2019   x2   BREAST LUMPECTOMY Right 01/22/2020   BREAST LUMPECTOMY WITH RADIOACTIVE SEED AND SENTINEL LYMPH NODE BIOPSY Right 01/22/2020   Procedure: RIGHT BREAST LUMPECTOMY WITH RADIOACTIVE SEED X2 AND RIGHT SENTINEL LYMPH NODE MAPPING;  Surgeon: Erroll Luna, MD;  Location: [place];  Service: General;  Laterality: Right;   BREAST SURGERY Right 03/1998   breast biopsy, benign   EYE SURGERY Right    PORT-A-CATH REMOVAL Right 04/30/2021   Procedure: REMOVAL PORT-A-CATH;  Surgeon: Erroll Luna, MD;  Location: [place];  Service: General;  Laterality: Right;   PORTACATH PLACEMENT Right  01/22/2020   Procedure: INSERTION PORT-A-CATH WITH ULTRASOUND;  Surgeon: Erroll Luna, MD;  Location: [place];  Service: General;  Laterality: Right;   PORTACATH PLACEMENT Right 02/28/2020   Procedure: PORT A CATH REVISION;  Surgeon: Erroll Luna, MD;  Location: [place];  Service: General;  Laterality: Right;   REVERSE SHOULDER ARTHROPLASTY Left 02/02/2021   Procedure: LEFT REVERSE SHOULDER ARTHROPLASTY;  Surgeon: Meredith Pel, MD;  Location: [place];  Service: Orthopedics;  Laterality: Left;   SHOULDER SURGERY Right    SPINAL FUSION  03/04/2011   with ORIF    There were no vitals filed for this visit.   Subjective Assessment - 10/14/21 1328     Subjective She relays her back pain is much better today after the DN.    Pertinent History anxiety, breast cancer with lumpectomy and radioactive seed placement 2021 and recent port removal, chronic pain, concussion 2008, HTN, depression, spinal fusion 2012.    Patient Stated Goals improve gait and balance and back pain    Pain Onset More than a month ago    Pain Onset More than a month ago              [facility] Adult PT Treatment/Exercise - 10/14/21 0001       Neuro Re-ed  Neuro Re-ed Details  rocker board A-P 1 min, lateral 1 min. sidestepping and fwd walking on foam pad X3 trips      Lumbar Exercises: Aerobic   Nustep L6 X15 min      Lumbar Exercises: Machines for Strengthening   Leg Press 93# DL X30      Lumbar Exercises: Standing   Other Standing Lumbar Exercises step ups on 6 inch step with one UE support X 5 bilat      Manual Therapy   Manual therapy comments compression and skilled palpation during DN              Trigger Point Dry Needling - 10/14/21 0001     Consent Given? Yes    Muscles Treated Back/Hip Quadratus lumborum;Erector spinae;Lumbar multifidi    Dry Needling Comments good overall tolerance noted, no immediate adverse reaction                     PT Short Term  Goals - 10/07/21 1353       PT SHORT TERM GOAL #1   Title independent with intial HEP    Baseline met 10/19 but continue to progress    Time 4    Period Weeks    Status Achieved    Target Date 10/08/21      PT SHORT TERM GOAL #2   Title improve TUG to less than 17 seconds    Baseline Met 10/19, will make new goal for this    Time 4    Period Weeks    Status Achieved    Target Date 10/08/21      PT SHORT TERM GOAL #3   Title Pt will improve TUG time to less than 11 seconds without AD to improve gait speed and balance.    Time 4    Period Weeks    Status New    Target Date 11/04/21               PT Long Term Goals - 10/07/21 1355       PT LONG TERM GOAL #1   Title She will improve BERG balance score to 46 or more to show improved balance    Baseline 41    Time 12    Period Weeks    Status On-going      PT LONG TERM GOAL #2   Title She will improve TUG score to <10 seconds  to show improved balance and walk speed.    Baseline met 10/19, wrote new goal for this    Period Weeks    Status New      PT LONG TERM GOAL #3   Title report back pain < 3/10 with activity for improved function    Time 12    Period Weeks    Status On-going      PT LONG TERM GOAL #4   Title Improve bilat knee/hip strength to 5/5 MMT tested in sitting    Baseline now 4 to 4+ on 10/19    Time 12    Period Weeks    Status On-going      PT LONG TERM GOAL #5   Title -    Baseline -      PT LONG TERM GOAL #6   Title -      PT LONG TERM GOAL #7   Title -  Plan - 10/14/21 1353     Clinical Impression Statement Again performed DN as this greatly reduced her overall low back pain. We then continued to work on her knee strength, endurance and overall balance as tolerated. Continue POC.    Personal Factors and Comorbidities Comorbidity 3+    Comorbidities anxiety, breast cancer with lumpectomy and radioactive seed placement 2021 and recent port removal,  chronic pain, concussion 2008, HTN, depression, spinal fusion 2012    Examination-Activity Limitations Locomotion Level;Reach Overhead;Dressing;Hygiene/Grooming;Lift;Toileting;Bathing    Examination-Participation Restrictions Cleaning;Meal Prep;Community Activity;Driving;Laundry    Stability/Clinical Decision Making Evolving/Moderate complexity    Rehab Potential Fair    PT Frequency 2x / week    PT Duration 12 weeks    PT Treatment/Interventions ADLs/Self Care Home Management;Therapeutic exercise;Patient/family education;Cryotherapy;DME Instruction;Ultrasound;Moist Heat;Functional mobility training;Therapeutic activities;Balance training;Neuromuscular re-education;Manual techniques;Vasopneumatic Device;Taping;Dry needling;Passive range of motion;Electrical Stimulation;Gait training    PT Next Visit Plan DN PRN, progress gait  balance, leg strength, endurance    PT Home Exercise Plan Access Code: [email or web address], added AROM flexion and abd against gravity in sitting and ER with red band    Consulted and Agree with Plan of Care Patient             Patient will benefit from skilled therapeutic intervention in order to improve the following deficits and impairments:  Postural dysfunction, Decreased range of motion, Decreased knowledge of precautions, Increased edema, Impaired UE functional use, Decreased strength, Decreased mobility, Decreased balance, Pain, Difficulty walking, Decreased activity tolerance, Decreased endurance  Visit Diagnosis: Other abnormalities of gait and mobility  Chronic bilateral low back pain without sciatica  Muscle weakness (generalized)  Acute pain of right knee     Problem List Patient Active Problem List   Diagnosis Date Noted   Arthritis of left shoulder region    S/P reverse total shoulder arthroplasty, 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   Vitamin D deficiency 01/15/2021   Vitamin B12 deficiency 01/15/2021   Primary insomnia 01/15/2021   Morbid obesity ([place]) 01/15/2021    Moderate recurrent major depression ([place]) 01/15/2021   Benzodiazepine dependence ([place]) 01/15/2021   Port-A-Cath in place 07/22/2020   Left leg cellulitis 04/17/2020   Cellulitis of left leg 04/17/2020   Hypokalemia 04/17/2020   Macrocytic anemia 04/17/2020   Anxiety 04/17/2020   Depression 04/17/2020   Chronic diarrhea 04/17/2020   Chemotherapy induced diarrhea 04/17/2020   Malignant neoplasm of upper-inner quadrant of right breast in female, estrogen receptor positive ([place]) 12/26/2019   Encephalopathy 02/02/2018   Hypothyroidism 02/02/2018   AKI (acute kidney injury) ([place]) 02/02/2018   Chronic back pain 02/02/2018   Alcohol abuse 02/02/2018   Weight gain 02/02/2018   Benign essential HTN 02/02/2018    Debbe Odea, PT,DPT 10/14/2021, 2:19 PM  [facility] [address] [place], [place], [address] Phone: [phone]   Fax:  [phone]  Name: [name] MRN: [id number] Date of Birth: 04-Sep-1951

## 2021-10-19 ENCOUNTER — Ambulatory Visit (INDEPENDENT_AMBULATORY_CARE_PROVIDER_SITE_OTHER): Payer: Medicare PPO | Admitting: Physical Therapy

## 2021-10-19 ENCOUNTER — Other Ambulatory Visit: Payer: Self-pay

## 2021-10-19 DIAGNOSIS — M545 Low back pain, unspecified: Secondary | ICD-10-CM

## 2021-10-19 DIAGNOSIS — G8929 Other chronic pain: Secondary | ICD-10-CM

## 2021-10-19 DIAGNOSIS — R2689 Other abnormalities of gait and mobility: Secondary | ICD-10-CM

## 2021-10-19 DIAGNOSIS — M6281 Muscle weakness (generalized): Secondary | ICD-10-CM | POA: Diagnosis not present

## 2021-10-19 NOTE — Therapy (Signed)
[facility] Physical Therapy [address] [place], [place], [address] Phone: [phone]   Fax:  [phone]  Physical Therapy Treatment  Patient Details  Name: [name] MRN: [id number] Date of Birth: 17-Jun-1951 Referring Provider (PT): Donella Stade, [place]   Encounter Date: 10/19/2021   PT End of Session - 10/19/21 1336     Visit Number 9    Number of Visits 20    Date for PT Re-Evaluation 12/03/21    Authorization Type [employer]    Authorization Time Period 09/09/21-11/09/21    Authorization - Visit Number 9    Authorization - Number of Visits 12    Progress Note Due on Visit 10    PT Start Time 1300    PT Stop Time 1342    PT Time Calculation (min) 42 min    Activity Tolerance Patient tolerated treatment well    Behavior During Therapy [facility] for tasks assessed/performed             Past Medical History:  Diagnosis Date   Anxiety    Atrial fibrillation [facility])    sees Dr. Audie Box   Cancer [facility])    breast   Chronic pain    Concussion 02/2007   ICU x 3 days   Depression    Hypertension    Hypothyroidism    Personal history of chemotherapy    Personal history of radiation therapy    Spinal stenosis    Thyroid disease ?1994    Past Surgical History:  Procedure Laterality Date   BREAST BIOPSY Right 12/20/2019   x2   BREAST LUMPECTOMY Right 01/22/2020   BREAST LUMPECTOMY WITH RADIOACTIVE SEED AND SENTINEL LYMPH NODE BIOPSY Right 01/22/2020   Procedure: RIGHT BREAST LUMPECTOMY WITH RADIOACTIVE SEED X2 AND RIGHT SENTINEL LYMPH NODE MAPPING;  Surgeon: Erroll Luna, MD;  Location: [place];  Service: General;  Laterality: Right;   BREAST SURGERY Right 03/1998   breast biopsy, benign   EYE SURGERY Right    PORT-A-CATH REMOVAL Right 04/30/2021   Procedure: REMOVAL PORT-A-CATH;  Surgeon: Erroll Luna, MD;  Location: [place];  Service: General;  Laterality: Right;   PORTACATH PLACEMENT Right  01/22/2020   Procedure: INSERTION PORT-A-CATH WITH ULTRASOUND;  Surgeon: Erroll Luna, MD;  Location: [place];  Service: General;  Laterality: Right;   PORTACATH PLACEMENT Right 02/28/2020   Procedure: PORT A CATH REVISION;  Surgeon: Erroll Luna, MD;  Location: [place];  Service: General;  Laterality: Right;   REVERSE SHOULDER ARTHROPLASTY Left 02/02/2021   Procedure: LEFT REVERSE SHOULDER ARTHROPLASTY;  Surgeon: Meredith Pel, MD;  Location: [place];  Service: Orthopedics;  Laterality: Left;   SHOULDER SURGERY Right    SPINAL FUSION  03/04/2011   with ORIF    There were no vitals filed for this visit.   Subjective Assessment - 10/19/21 1336     Subjective denies back pain today since last visit where she had the DN performed.    Pertinent History anxiety, breast cancer with lumpectomy and radioactive seed placement 2021 and recent port removal, chronic pain, concussion 2008, HTN, depression, spinal fusion 2012.    Patient Stated Goals improve gait and balance and back pain    Pain Onset More than a month ago    Pain Onset More than a month ago  [place] Adult PT Treatment/Exercise - 10/19/21 0001       Neuro Re-ed    Neuro Re-ed Details  rocker board A-P 2 min, lateral 2 min. sidestepping toward 3 cones X 5 ea      Lumbar Exercises: Aerobic   Nustep L6-7 X15 min      Lumbar Exercises: Machines for Strengthening   Leg Press 100# 3X15      Lumbar Exercises: Standing   Other Standing Lumbar Exercises step ups on 6 inch step with one UE support X 15 bilat fwd and lateral    Other Standing Lumbar Exercises TRX [id number]                       PT Short Term Goals - 10/07/21 1353       PT SHORT TERM GOAL #1   Title independent with intial HEP    Baseline met 10/19 but continue to progress    Time 4    Period Weeks    Status Achieved    Target Date 10/08/21      PT SHORT TERM GOAL #2    Title improve TUG to less than 17 seconds    Baseline Met 10/19, will make new goal for this    Time 4    Period Weeks    Status Achieved    Target Date 10/08/21      PT SHORT TERM GOAL #3   Title Pt will improve TUG time to less than 11 seconds without AD to improve gait speed and balance.    Time 4    Period Weeks    Status New    Target Date 11/04/21               PT Long Term Goals - 10/07/21 1355       PT LONG TERM GOAL #1   Title She will improve BERG balance score to 46 or more to show improved balance    Baseline 41    Time 12    Period Weeks    Status On-going      PT LONG TERM GOAL #2   Title She will improve TUG score to <10 seconds  to show improved balance and walk speed.    Baseline met 10/19, wrote new goal for this    Period Weeks    Status New      PT LONG TERM GOAL #3   Title report back pain < 3/10 with activity for improved function    Time 12    Period Weeks    Status On-going      PT LONG TERM GOAL #4   Title Improve bilat knee/hip strength to 5/5 MMT tested in sitting    Baseline now 4 to 4+ on 10/19    Time 12    Period Weeks    Status On-going      PT LONG TERM GOAL #5   Title -    Baseline -      PT LONG TERM GOAL #6   Title -      PT LONG TERM GOAL #7   Title -                   Plan - 10/19/21 1342     Clinical Impression Statement She was not having back pain today DN held and she had good overall tolerance to leg strengthening and overall balance program today. She will need 10th visit progress  note next visit.    Personal Factors and Comorbidities Comorbidity 3+    Comorbidities anxiety, breast cancer with lumpectomy and radioactive seed placement 2021 and recent port removal, chronic pain, concussion 2008, HTN, depression, spinal fusion 2012    Examination-Activity Limitations Locomotion Level;Reach Overhead;Dressing;Hygiene/Grooming;Lift;Toileting;Bathing    Examination-Participation Restrictions  Cleaning;Meal Prep;Community Activity;Driving;Laundry    Stability/Clinical Decision Making Evolving/Moderate complexity    Rehab Potential Fair    PT Frequency 2x / week    PT Duration 12 weeks    PT Treatment/Interventions ADLs/Self Care Home Management;Therapeutic exercise;Patient/family education;Cryotherapy;DME Instruction;Ultrasound;Moist Heat;Functional mobility training;Therapeutic activities;Balance training;Neuromuscular re-education;Manual techniques;Vasopneumatic Device;Taping;Dry needling;Passive range of motion;Electrical Stimulation;Gait training    PT Next Visit Plan needs progress note. DN PRN, progress gait  balance, leg strength, endurance    PT Home Exercise Plan Access Code: [email or web address], added AROM flexion and abd against gravity in sitting and ER with red band    Consulted and Agree with Plan of Care Patient             Patient will benefit from skilled therapeutic intervention in order to improve the following deficits and impairments:  Postural dysfunction, Decreased range of motion, Decreased knowledge of precautions, Increased edema, Impaired UE functional use, Decreased strength, Decreased mobility, Decreased balance, Pain, Difficulty walking, Decreased activity tolerance, Decreased endurance  Visit Diagnosis: Other abnormalities of gait and mobility  Chronic bilateral low back pain without sciatica  Muscle weakness (generalized)     Problem List Patient Active Problem List   Diagnosis Date Noted   Arthritis of left shoulder region    S/P reverse total shoulder arthroplasty, 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   Vitamin D deficiency 01/15/2021   Vitamin B12 deficiency 01/15/2021   Primary insomnia 01/15/2021   Morbid obesity ([place]) 01/15/2021   Moderate recurrent major depression ([place]) 01/15/2021   Benzodiazepine dependence ([place]) 01/15/2021   Port-A-Cath in place 07/22/2020   Left leg cellulitis 04/17/2020   Cellulitis of left leg 04/17/2020   Hypokalemia 04/17/2020    Macrocytic anemia 04/17/2020   Anxiety 04/17/2020   Depression 04/17/2020   Chronic diarrhea 04/17/2020   Chemotherapy induced diarrhea 04/17/2020   Malignant neoplasm of upper-inner quadrant of right breast in female, estrogen receptor positive ([place]) 12/26/2019   Encephalopathy 02/02/2018   Hypothyroidism 02/02/2018   AKI (acute kidney injury) ([place]) 02/02/2018   Chronic back pain 02/02/2018   Alcohol abuse 02/02/2018   Weight gain 02/02/2018   Benign essential HTN 02/02/2018    Debbe Odea, PT,DPT 10/19/2021, 1:44 PM  [facility] Physical Therapy [address] [place], [place], [address] Phone: [phone]   Fax:  [phone]  Name: [name] MRN: [id number] Date of Birth: May 20, 1951

## 2021-10-21 ENCOUNTER — Ambulatory Visit (INDEPENDENT_AMBULATORY_CARE_PROVIDER_SITE_OTHER): Payer: Medicare PPO | Admitting: Physical Therapy

## 2021-10-21 ENCOUNTER — Other Ambulatory Visit: Payer: Self-pay

## 2021-10-21 DIAGNOSIS — M6281 Muscle weakness (generalized): Secondary | ICD-10-CM

## 2021-10-21 DIAGNOSIS — M25561 Pain in right knee: Secondary | ICD-10-CM | POA: Diagnosis not present

## 2021-10-21 DIAGNOSIS — R2689 Other abnormalities of gait and mobility: Secondary | ICD-10-CM

## 2021-10-21 DIAGNOSIS — G8929 Other chronic pain: Secondary | ICD-10-CM

## 2021-10-21 DIAGNOSIS — M545 Low back pain, unspecified: Secondary | ICD-10-CM

## 2021-10-21 NOTE — Therapy (Signed)
[facility] Physical Therapy [address] [place], [place], [address] Phone: [phone]   Fax:  [phone]  Physical Therapy Treatment/10 visit progress note Progress Note reporting period date 09/09/21 to 10/21/21  See below for objective and subjective measurements relating to patients progress with PT.    Patient Details  Name: LATESA FRATTO MRN: [id number] Date of Birth: May 18, 1951 Referring Provider (PT): Donella Stade, [place]   Encounter Date: 10/21/2021   PT End of Session - 10/21/21 1318     Visit Number 10    Number of Visits 20    Date for PT Re-Evaluation 12/03/21    Authorization Type [employer]    Authorization Time Period 09/09/21-11/09/21    Authorization - Visit Number 10    Authorization - Number of Visits 12    Progress Note Due on Visit 10    PT Start Time 1302    PT Stop Time 1345    PT Time Calculation (min) 43 min    Activity Tolerance Patient tolerated treatment well    Behavior During Therapy [facility] for tasks assessed/performed             Past Medical History:  Diagnosis Date   Anxiety    Atrial fibrillation [facility])    sees Dr. Audie Box   Cancer [facility])    breast   Chronic pain    Concussion 02/2007   ICU x 3 days   Depression    Hypertension    Hypothyroidism    Personal history of chemotherapy    Personal history of radiation therapy    Spinal stenosis    Thyroid disease ?1994    Past Surgical History:  Procedure Laterality Date   BREAST BIOPSY Right 12/20/2019   x2   BREAST LUMPECTOMY Right 01/22/2020   BREAST LUMPECTOMY WITH RADIOACTIVE SEED AND SENTINEL LYMPH NODE BIOPSY Right 01/22/2020   Procedure: RIGHT BREAST LUMPECTOMY WITH RADIOACTIVE SEED X2 AND RIGHT SENTINEL LYMPH NODE MAPPING;  Surgeon: Erroll Luna, MD;  Location: [place];  Service: General;  Laterality: Right;   BREAST SURGERY Right 03/1998   breast biopsy, benign   EYE SURGERY Right    PORT-A-CATH REMOVAL Right 04/30/2021    Procedure: REMOVAL PORT-A-CATH;  Surgeon: Erroll Luna, MD;  Location: [place];  Service: General;  Laterality: Right;   PORTACATH PLACEMENT Right 01/22/2020   Procedure: INSERTION PORT-A-CATH WITH ULTRASOUND;  Surgeon: Erroll Luna, MD;  Location: [place];  Service: General;  Laterality: Right;   PORTACATH PLACEMENT Right 02/28/2020   Procedure: PORT A CATH REVISION;  Surgeon: Erroll Luna, MD;  Location: [place];  Service: General;  Laterality: Right;   REVERSE SHOULDER ARTHROPLASTY Left 02/02/2021   Procedure: LEFT REVERSE SHOULDER ARTHROPLASTY;  Surgeon: Meredith Pel, MD;  Location: [place];  Service: Orthopedics;  Laterality: Left;   SHOULDER SURGERY Right    SPINAL FUSION  03/04/2011   with ORIF    There were no vitals filed for this visit.   Subjective Assessment - 10/21/21 1314     Subjective states she burned her hand by accident getting potato out of oven and has 3rd degree burn on her left hand. She reports very littel knee and back pain today    Pertinent History anxiety, breast cancer with lumpectomy and radioactive seed placement 2021 and recent port removal, chronic pain, concussion 2008, HTN, depression, spinal fusion 2012.    Patient Stated Goals improve gait and balance and back pain    Pain  Onset More than a month ago    Pain Onset More than a month ago                [facility] PT Assessment - 10/21/21 0001       Assessment   Medical Diagnosis LBP, Lt knee pain and weakness from Stress Fx,    Referring Provider (PT) Magnant, Gerrianne Scale, PA-C      Strength   Overall Strength Comments hip strength grossly 4+/5, knee strength grossly 5/5      Ambulation/Gait   Ambulation/Gait Yes    Ambulation/Gait Assistance 7: Independent    Ambulation Distance (Feet) 150 Feet    Assistive device None    Gait Comments can now ambulate limited community distances and no longer needs AD for this but does have wider BOS and slightly  slower velocity      Berg Balance Test   Sit to Stand Able to stand without using hands and stabilize independently    Standing Unsupported Able to stand safely 2 minutes    Sitting with Back Unsupported but Feet Supported on Floor or Stool Able to sit safely and securely 2 minutes    Stand to Sit Sits safely with minimal use of hands    Transfers Able to transfer safely, minor use of hands    Standing Unsupported with Eyes Closed Able to stand 10 seconds safely    Standing Unsupported with Feet Together Able to place feet together independently and stand 1 minute safely    From Standing, Reach Forward with Outstretched Arm Can reach confidently >25 cm (10")    From Standing Position, Pick up Object from Floor Able to pick up shoe safely and easily    From Standing Position, Turn to Look Behind Over each Shoulder Looks behind from both sides and weight shifts well    Turn 360 Degrees Able to turn 360 degrees safely in 4 seconds or less    Standing Unsupported, Alternately Place Feet on Step/Stool Able to stand independently and safely and complete 8 steps in 20 seconds    Standing Unsupported, One Foot in Front Able to take small step independently and hold 30 seconds    Standing on One Leg Tries to lift leg/unable to hold 3 seconds but remains standing independently    Total Score 51      Timed Up and Go Test   Normal TUG (seconds) 10.45    TUG Comments without AD                           [facility] Adult PT Treatment/Exercise - 10/21/21 0001       Lumbar Exercises: Aerobic   Nustep L6X15 min      Lumbar Exercises: Machines for Strengthening   Leg Press 106# 3X15, SL 50# X20 bilat      Lumbar Exercises: Standing   Other Standing Lumbar Exercises step ups on 6 inch step with one UE support X 15 bilat fwd and lateral                       PT Short Term Goals - 10/21/21 1411       PT SHORT TERM GOAL #1   Title independent with intial HEP    Baseline  met 10/19 but continue to progress    Time 4    Period Weeks    Status Achieved    Target Date 10/08/21  PT SHORT TERM GOAL #2   Title improve TUG to less than 17 seconds    Baseline Met 10/19, will make new goal for this    Time 4    Period Weeks    Status Achieved    Target Date 10/08/21      PT SHORT TERM GOAL #3   Title Pt will improve TUG time to less than 11 seconds without AD to improve gait speed and balance.    Baseline 10.45 on 11/2    Time 4    Period Weeks    Status Achieved    Target Date 11/04/21               PT Long Term Goals - 10/21/21 1333       PT LONG TERM GOAL #1   Title She will improve BERG balance score to 46 or more to show improved balance    Baseline scored 51 today 11/2    Time 12    Period Weeks    Status Achieved      PT LONG TERM GOAL #2   Title She will improve TUG score to <10 seconds  to show improved balance and walk speed.    Baseline improved to 10.45 on 11/2    Period Weeks    Status On-going      PT LONG TERM GOAL #3   Title report back pain < 3/10 with activity for improved function    Baseline depends, can still get up to 5 at times    Time 12    Period Weeks    Status On-going      PT LONG TERM GOAL #4   Title Improve bilat knee/hip strength to 5/5 MMT tested in sitting    Baseline now 4+ for hip and 5 for knee    Time 12    Period Weeks    Status On-going      PT LONG TERM GOAL #5   Title -    Baseline -      PT LONG TERM GOAL #6   Title -      PT LONG TERM GOAL #7   Title -                   Plan - 10/21/21 1353     Clinical Impression Statement Sesison focused on updating goals and functional measurements10th visit Progress note. She has made great overall progress in her balance, knee pain and leg strength. She has met her short term PT goals and has made good overall strides toward her long term PT goals but has not quite met them and would benefit from a little more PT.    Personal  Factors and Comorbidities Comorbidity 3+    Comorbidities anxiety, breast cancer with lumpectomy and radioactive seed placement 2021 and recent port removal, chronic pain, concussion 2008, HTN, depression, spinal fusion 2012    Examination-Activity Limitations Locomotion Level;Reach Overhead;Dressing;Hygiene/Grooming;Lift;Toileting;Bathing    Examination-Participation Restrictions Cleaning;Meal Prep;Community Activity;Driving;Laundry    Stability/Clinical Decision Making Evolving/Moderate complexity    Rehab Potential Fair    PT Frequency 2x / week    PT Duration 12 weeks    PT Treatment/Interventions ADLs/Self Care Home Management;Therapeutic exercise;Patient/family education;Cryotherapy;DME Instruction;Ultrasound;Moist Heat;Functional mobility training;Therapeutic activities;Balance training;Neuromuscular re-education;Manual techniques;Vasopneumatic Device;Taping;Dry needling;Passive range of motion;Electrical Stimulation;Gait training    PT Next Visit Plan I sent 10th visit progress note 10/21/21 to MD so do not need to do MD follow up note as she sees  Dr. next. Continue to work to improve overall leg strength and dynamic balance    PT Home Exercise Plan Access Code: [email or web address], added tandem balance and SLS    Consulted and Agree with Plan of Care Patient             Patient will benefit from skilled therapeutic intervention in order to improve the following deficits and impairments:  Postural dysfunction, Decreased range of motion, Decreased knowledge of precautions, Increased edema, Impaired UE functional use, Decreased strength, Decreased mobility, Decreased balance, Pain, Difficulty walking, Decreased activity tolerance, Decreased endurance  Visit Diagnosis: Other abnormalities of gait and mobility  Chronic bilateral low back pain without sciatica  Muscle weakness (generalized)  Acute pain of right knee     Problem List Patient Active Problem List   Diagnosis Date Noted    Arthritis of left shoulder region    S/P reverse total shoulder arthroplasty, 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   Vitamin D deficiency 01/15/2021   Vitamin B12 deficiency 01/15/2021   Primary insomnia 01/15/2021   Morbid obesity ([place]) 01/15/2021   Moderate recurrent major depression ([place]) 01/15/2021   Benzodiazepine dependence ([place]) 01/15/2021   Port-A-Cath in place 07/22/2020   Left leg cellulitis 04/17/2020   Cellulitis of left leg 04/17/2020   Hypokalemia 04/17/2020   Macrocytic anemia 04/17/2020   Anxiety 04/17/2020   Depression 04/17/2020   Chronic diarrhea 04/17/2020   Chemotherapy induced diarrhea 04/17/2020   Malignant neoplasm of upper-inner quadrant of right breast in female, estrogen receptor positive ([place]) 12/26/2019   Encephalopathy 02/02/2018   Hypothyroidism 02/02/2018   AKI (acute kidney injury) ([place]) 02/02/2018   Chronic back pain 02/02/2018   Alcohol abuse 02/02/2018   Weight gain 02/02/2018   Benign essential HTN 02/02/2018    Debbe Odea, PT,DPT 10/21/2021, 2:13 PM  [facility] Physical Therapy [address] [place], [place], [address] Phone: [phone]   Fax:  [phone]  Name: JONET MATHIES MRN: [id number] Date of Birth: 1951/10/05

## 2021-10-26 ENCOUNTER — Ambulatory Visit (INDEPENDENT_AMBULATORY_CARE_PROVIDER_SITE_OTHER): Payer: Medicare PPO | Admitting: Physical Therapy

## 2021-10-26 ENCOUNTER — Ambulatory Visit: Payer: Medicare PPO | Admitting: Orthopedic Surgery

## 2021-10-26 ENCOUNTER — Other Ambulatory Visit: Payer: Self-pay

## 2021-10-26 ENCOUNTER — Encounter: Payer: Self-pay | Admitting: Physical Therapy

## 2021-10-26 DIAGNOSIS — M6281 Muscle weakness (generalized): Secondary | ICD-10-CM | POA: Diagnosis not present

## 2021-10-26 DIAGNOSIS — G8929 Other chronic pain: Secondary | ICD-10-CM

## 2021-10-26 DIAGNOSIS — M545 Low back pain, unspecified: Secondary | ICD-10-CM | POA: Diagnosis not present

## 2021-10-26 DIAGNOSIS — R2689 Other abnormalities of gait and mobility: Secondary | ICD-10-CM | POA: Diagnosis not present

## 2021-10-26 DIAGNOSIS — M25561 Pain in right knee: Secondary | ICD-10-CM | POA: Diagnosis not present

## 2021-10-26 NOTE — Therapy (Signed)
[facility] [address] [place], [place], [address] Phone: [phone]   Fax:  [phone]  Physical Therapy Treatment  Patient Details  Name: [name] MRN: [id number] Date of Birth: 07-Mar-1951 Referring Provider (PT): Donella Stade, [place]   Encounter Date: 10/26/2021   PT End of Session - 10/26/21 1339     Visit Number 11    Number of Visits 20    Date for PT Re-Evaluation 12/03/21    Authorization Type [employer]    Authorization Time Period 09/09/21-11/09/21    Authorization - Visit Number 11    Authorization - Number of Visits 12    Progress Note Due on Visit 20    PT Start Time 1300    PT Stop Time 7096   pt requested to end session early   PT Time Calculation (min) 33 min    Activity Tolerance Patient tolerated treatment well;Patient limited by fatigue    Behavior During Therapy [facility] for tasks assessed/performed             Past Medical History:  Diagnosis Date   Anxiety    Atrial fibrillation [facility])    sees Dr. Audie Box   Cancer [facility])    breast   Chronic pain    Concussion 02/2007   ICU x 3 days   Depression    Hypertension    Hypothyroidism    Personal history of chemotherapy    Personal history of radiation therapy    Spinal stenosis    Thyroid disease ?1994    Past Surgical History:  Procedure Laterality Date   BREAST BIOPSY Right 12/20/2019   x2   BREAST LUMPECTOMY Right 01/22/2020   BREAST LUMPECTOMY WITH RADIOACTIVE SEED AND SENTINEL LYMPH NODE BIOPSY Right 01/22/2020   Procedure: RIGHT BREAST LUMPECTOMY WITH RADIOACTIVE SEED X2 AND RIGHT SENTINEL LYMPH NODE MAPPING;  Surgeon: Erroll Luna, MD;  Location: [place];  Service: General;  Laterality: Right;   BREAST SURGERY Right 03/1998   breast biopsy, benign   EYE SURGERY Right    PORT-A-CATH REMOVAL Right 04/30/2021   Procedure: REMOVAL PORT-A-CATH;  Surgeon: Erroll Luna, MD;  Location: [place];  Service:  General;  Laterality: Right;   PORTACATH PLACEMENT Right 01/22/2020   Procedure: INSERTION PORT-A-CATH WITH ULTRASOUND;  Surgeon: Erroll Luna, MD;  Location: [place];  Service: General;  Laterality: Right;   PORTACATH PLACEMENT Right 02/28/2020   Procedure: PORT A CATH REVISION;  Surgeon: Erroll Luna, MD;  Location: [place];  Service: General;  Laterality: Right;   REVERSE SHOULDER ARTHROPLASTY Left 02/02/2021   Procedure: LEFT REVERSE SHOULDER ARTHROPLASTY;  Surgeon: Meredith Pel, MD;  Location: [place];  Service: Orthopedics;  Laterality: Left;   SHOULDER SURGERY Right    SPINAL FUSION  03/04/2011   with ORIF    There were no vitals filed for this visit.   Subjective Assessment - 10/26/21 1256     Subjective feels a little unsteady today but otherwise doing well.    Pertinent History anxiety, breast cancer with lumpectomy and radioactive seed placement 2021 and recent port removal, chronic pain, concussion 2008, HTN, depression, spinal fusion 2012.    Patient Stated Goals improve gait and balance and back pain    Currently in Pain? Yes    Pain Score 3     Pain Location Back    Pain Orientation Left    Pain Descriptors / Indicators Aching;Sharp    Pain Type Acute pain  Pain Onset More than a month ago    Pain Frequency Constant    Aggravating Factors  static positioning    Pain Relieving Factors changing positions, heat                               [facility] Adult PT Treatment/Exercise - 10/26/21 1304       Lumbar Exercises: Aerobic   Nustep L6X15 min                 Balance Exercises - 10/26/21 1321       Balance Exercises: Standing   Standing Eyes Opened Narrow base of support (BOS);Foam/compliant surface;Head turns    Step Ups Forward;2 inch;UE support 1;UE support 2   x10 bil; onto foam     OTAGO PROGRAM   Knee Bends 20 reps, no support    Sideways Walking No assistive device    Tandem Walk Support                   PT Short Term Goals - 10/21/21 1411       PT SHORT TERM GOAL #1   Title independent with intial HEP    Baseline met 10/19 but continue to progress    Time 4    Period Weeks    Status Achieved    Target Date 10/08/21      PT SHORT TERM GOAL #2   Title improve TUG to less than 17 seconds    Baseline Met 10/19, will make new goal for this    Time 4    Period Weeks    Status Achieved    Target Date 10/08/21      PT SHORT TERM GOAL #3   Title Pt will improve TUG time to less than 11 seconds without AD to improve gait speed and balance.    Baseline 10.45 on 11/2    Time 4    Period Weeks    Status Achieved    Target Date 11/04/21               PT Long Term Goals - 10/21/21 1333       PT LONG TERM GOAL #1   Title She will improve BERG balance score to 46 or more to show improved balance    Baseline scored 51 today 11/2    Time 12    Period Weeks    Status Achieved      PT LONG TERM GOAL #2   Title She will improve TUG score to <10 seconds  to show improved balance and walk speed.    Baseline improved to 10.45 on 11/2    Period Weeks    Status On-going      PT LONG TERM GOAL #3   Title report back pain < 3/10 with activity for improved function    Baseline depends, can still get up to 5 at times    Time 12    Period Weeks    Status On-going      PT LONG TERM GOAL #4   Title Improve bilat knee/hip strength to 5/5 MMT tested in sitting    Baseline now 4+ for hip and 5 for knee    Time 12    Period Weeks    Status On-going      PT LONG TERM GOAL #5   Title -    Baseline -      PT  LONG TERM GOAL #6   Title -      PT LONG TERM GOAL #7   Title -                   Plan - 10/26/21 1340     Clinical Impression Statement Pt tolerated session well today but requested to end session early due to fatigue.  Continues to have difficulty with balance activities and anticipate will request additional visits for 4-6 more weeks to  continue balance activities.    Personal Factors and Comorbidities Comorbidity 3+    Comorbidities anxiety, breast cancer with lumpectomy and radioactive seed placement 2021 and recent port removal, chronic pain, concussion 2008, HTN, depression, spinal fusion 2012    Examination-Activity Limitations Locomotion Level;Reach Overhead;Dressing;Hygiene/Grooming;Lift;Toileting;Bathing    Examination-Participation Restrictions Cleaning;Meal Prep;Community Activity;Driving;Laundry    Stability/Clinical Decision Making Evolving/Moderate complexity    Rehab Potential Fair    PT Frequency 2x / week    PT Duration 12 weeks    PT Treatment/Interventions ADLs/Self Care Home Management;Therapeutic exercise;Patient/family education;Cryotherapy;DME Instruction;Ultrasound;Moist Heat;Functional mobility training;Therapeutic activities;Balance training;Neuromuscular re-education;Manual techniques;Vasopneumatic Device;Taping;Dry needling;Passive range of motion;Electrical Stimulation;Gait training    PT Next Visit Plan Continue to work to improve overall leg strength and dynamic balance; needs additional Craig Staggers    PT Home Exercise Plan Access Code: [email or web address], added tandem balance and SLS    Consulted and Agree with Plan of Care Patient             Patient will benefit from skilled therapeutic intervention in order to improve the following deficits and impairments:  Postural dysfunction, Decreased range of motion, Decreased knowledge of precautions, Increased edema, Impaired UE functional use, Decreased strength, Decreased mobility, Decreased balance, Pain, Difficulty walking, Decreased activity tolerance, Decreased endurance  Visit Diagnosis: Other abnormalities of gait and mobility  Chronic bilateral low back pain without sciatica  Muscle weakness (generalized)  Acute pain of right knee     Problem List Patient Active Problem List   Diagnosis Date Noted   Arthritis of left shoulder region     S/P reverse total shoulder arthroplasty, 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   Vitamin D deficiency 01/15/2021   Vitamin B12 deficiency 01/15/2021   Primary insomnia 01/15/2021   Morbid obesity ([place]) 01/15/2021   Moderate recurrent major depression ([place]) 01/15/2021   Benzodiazepine dependence ([place]) 01/15/2021   Port-A-Cath in place 07/22/2020   Left leg cellulitis 04/17/2020   Cellulitis of left leg 04/17/2020   Hypokalemia 04/17/2020   Macrocytic anemia 04/17/2020   Anxiety 04/17/2020   Depression 04/17/2020   Chronic diarrhea 04/17/2020   Chemotherapy induced diarrhea 04/17/2020   Malignant neoplasm of upper-inner quadrant of right breast in female, estrogen receptor positive ([place]) 12/26/2019   Encephalopathy 02/02/2018   Hypothyroidism 02/02/2018   AKI (acute kidney injury) ([place]) 02/02/2018   Chronic back pain 02/02/2018   Alcohol abuse 02/02/2018   Weight gain 02/02/2018   Benign essential HTN 02/02/2018      Laureen Abrahams, PT, DPT 10/26/21 1:43 PM     [facility] [address] [place], [place], [address] Phone: [phone]   Fax:  [phone]  Name: [name] MRN: [id number] Date of Birth: March 28, 1951

## 2021-10-28 ENCOUNTER — Encounter: Payer: Medicare PPO | Admitting: Physical Therapy

## 2021-11-04 ENCOUNTER — Encounter: Payer: Medicare PPO | Admitting: Physical Therapy

## 2021-11-05 ENCOUNTER — Other Ambulatory Visit: Payer: Self-pay

## 2021-11-05 ENCOUNTER — Ambulatory Visit (INDEPENDENT_AMBULATORY_CARE_PROVIDER_SITE_OTHER): Payer: Medicare PPO | Admitting: Physical Therapy

## 2021-11-05 DIAGNOSIS — M25512 Pain in left shoulder: Secondary | ICD-10-CM | POA: Diagnosis not present

## 2021-11-05 DIAGNOSIS — M545 Low back pain, unspecified: Secondary | ICD-10-CM | POA: Diagnosis not present

## 2021-11-05 DIAGNOSIS — M25561 Pain in right knee: Secondary | ICD-10-CM

## 2021-11-05 DIAGNOSIS — G8929 Other chronic pain: Secondary | ICD-10-CM

## 2021-11-05 DIAGNOSIS — M6281 Muscle weakness (generalized): Secondary | ICD-10-CM

## 2021-11-05 DIAGNOSIS — R2689 Other abnormalities of gait and mobility: Secondary | ICD-10-CM | POA: Diagnosis not present

## 2021-11-05 NOTE — Therapy (Signed)
[facility] [address] [place], [place], [address] Phone: [phone]   Fax:  [phone]  Physical Therapy Treatment  Patient Details  Name: [name] MRN: [id number] Date of Birth: 09-22-1951 Referring Provider (PT): Donella Stade, [place]   Encounter Date: 11/05/2021   PT End of Session - 11/05/21 1458     Visit Number 12    Number of Visits 20    Date for PT Re-Evaluation 12/03/21    Authorization Type [employer]    Authorization Time Period 09/09/21-11/09/21    Authorization - Visit Number 12    Authorization - Number of Visits 12    Progress Note Due on Visit 20    PT Start Time 7371    PT Stop Time 1450    PT Time Calculation (min) 45 min    Activity Tolerance Patient tolerated treatment well;Patient limited by pain    Behavior During Therapy [facility] for tasks assessed/performed             Past Medical History:  Diagnosis Date   Anxiety    Atrial fibrillation [facility])    sees Dr. Audie Box   Cancer [facility])    breast   Chronic pain    Concussion 02/2007   ICU x 3 days   Depression    Hypertension    Hypothyroidism    Personal history of chemotherapy    Personal history of radiation therapy    Spinal stenosis    Thyroid disease ?1994    Past Surgical History:  Procedure Laterality Date   BREAST BIOPSY Right 12/20/2019   x2   BREAST LUMPECTOMY Right 01/22/2020   BREAST LUMPECTOMY WITH RADIOACTIVE SEED AND SENTINEL LYMPH NODE BIOPSY Right 01/22/2020   Procedure: RIGHT BREAST LUMPECTOMY WITH RADIOACTIVE SEED X2 AND RIGHT SENTINEL LYMPH NODE MAPPING;  Surgeon: Erroll Luna, MD;  Location: [place];  Service: General;  Laterality: Right;   BREAST SURGERY Right 03/1998   breast biopsy, benign   EYE SURGERY Right    PORT-A-CATH REMOVAL Right 04/30/2021   Procedure: REMOVAL PORT-A-CATH;  Surgeon: Erroll Luna, MD;  Location: [place];  Service: General;  Laterality: Right;    PORTACATH PLACEMENT Right 01/22/2020   Procedure: INSERTION PORT-A-CATH WITH ULTRASOUND;  Surgeon: Erroll Luna, MD;  Location: [place];  Service: General;  Laterality: Right;   PORTACATH PLACEMENT Right 02/28/2020   Procedure: PORT A CATH REVISION;  Surgeon: Erroll Luna, MD;  Location: [place];  Service: General;  Laterality: Right;   REVERSE SHOULDER ARTHROPLASTY Left 02/02/2021   Procedure: LEFT REVERSE SHOULDER ARTHROPLASTY;  Surgeon: Meredith Pel, MD;  Location: [place];  Service: Orthopedics;  Laterality: Left;   SHOULDER SURGERY Right    SPINAL FUSION  03/04/2011   with ORIF    There were no vitals filed for this visit.   Subjective Assessment - 11/05/21 1433     Subjective feels very unsteady and has more back pain today 5/10. She says her knee is good and denies pain    Pertinent History anxiety, breast cancer with lumpectomy and radioactive seed placement 2021 and recent port removal, chronic pain, concussion 2008, HTN, depression, spinal fusion 2012.    Patient Stated Goals improve gait and balance and back pain    Pain Onset More than a month ago    Pain Onset More than a month ago               [facility] Adult PT Treatment/Exercise -  11/05/21 0001       Neuro Re-ed    Neuro Re-ed Details  tandem walk 5 round trips, sidestepping toward 3 cones X 5 ea      Lumbar Exercises: Aerobic   Nustep L6X15 min      Lumbar Exercises: Machines for Strengthening   Leg Press 112# 3X10, SL 50# X20 bilat      Manual Therapy   Manual therapy comments compression and skilled palpation during DN              Trigger Point Dry Needling - 11/05/21 0001     Consent Given? Yes    Muscles Treated Back/Hip Quadratus lumborum;Erector spinae;Lumbar multifidi    Dry Needling Comments good overall tolerance noted, no immediate adverse reaction                     PT Short Term Goals - 10/21/21 1411       PT SHORT TERM GOAL #1   Title  independent with intial HEP    Baseline met 10/19 but continue to progress    Time 4    Period Weeks    Status Achieved    Target Date 10/08/21      PT SHORT TERM GOAL #2   Title improve TUG to less than 17 seconds    Baseline Met 10/19, will make new goal for this    Time 4    Period Weeks    Status Achieved    Target Date 10/08/21      PT SHORT TERM GOAL #3   Title Pt will improve TUG time to less than 11 seconds without AD to improve gait speed and balance.    Baseline 10.45 on 11/2    Time 4    Period Weeks    Status Achieved    Target Date 11/04/21               PT Long Term Goals - 10/21/21 1333       PT LONG TERM GOAL #1   Title She will improve BERG balance score to 46 or more to show improved balance    Baseline scored 51 today 11/2    Time 12    Period Weeks    Status Achieved      PT LONG TERM GOAL #2   Title She will improve TUG score to <10 seconds  to show improved balance and walk speed.    Baseline improved to 10.45 on 11/2    Period Weeks    Status On-going      PT LONG TERM GOAL #3   Title report back pain < 3/10 with activity for improved function    Baseline depends, can still get up to 5 at times    Time 12    Period Weeks    Status On-going      PT LONG TERM GOAL #4   Title Improve bilat knee/hip strength to 5/5 MMT tested in sitting    Baseline now 4+ for hip and 5 for knee    Time 12    Period Weeks    Status On-going      PT LONG TERM GOAL #5   Title -    Baseline -      PT LONG TERM GOAL #6   Title -      PT LONG TERM GOAL #7   Title -  Plan - 11/05/21 1500     Clinical Impression Statement Session today limited by low back pain. This did resolve some with DN and aerobic exercise on Nu-step. We then worked on her overall leg strength and dynamic balance. She will need [employer] re-authorization next visit to extend her PT to continue to work to improve her balance, strength, pain, and function.     Personal Factors and Comorbidities Comorbidity 3+    Comorbidities anxiety, breast cancer with lumpectomy and radioactive seed placement 2021 and recent port removal, chronic pain, concussion 2008, HTN, depression, spinal fusion 2012    Examination-Activity Limitations Locomotion Level;Reach Overhead;Dressing;Hygiene/Grooming;Lift;Toileting;Bathing    Examination-Participation Restrictions Cleaning;Meal Prep;Community Activity;Driving;Laundry    Stability/Clinical Decision Making Evolving/Moderate complexity    Rehab Potential Fair    PT Frequency 2x / week    PT Duration 12 weeks    PT Treatment/Interventions ADLs/Self Care Home Management;Therapeutic exercise;Patient/family education;Cryotherapy;DME Instruction;Ultrasound;Moist Heat;Functional mobility training;Therapeutic activities;Balance training;Neuromuscular re-education;Manual techniques;Vasopneumatic Device;Taping;Dry needling;Passive range of motion;Electrical Stimulation;Gait training    PT Next Visit Plan needs additional Craig Staggers    PT Home Exercise Plan Access Code: [email or web address], added tandem balance and SLS    Consulted and Agree with Plan of Care Patient             Patient will benefit from skilled therapeutic intervention in order to improve the following deficits and impairments:  Postural dysfunction, Decreased range of motion, Decreased knowledge of precautions, Increased edema, Impaired UE functional use, Decreased strength, Decreased mobility, Decreased balance, Pain, Difficulty walking, Decreased activity tolerance, Decreased endurance  Visit Diagnosis: Other abnormalities of gait and mobility  Chronic bilateral low back pain without sciatica  Acute pain of right knee  Acute pain of left shoulder  Muscle weakness (generalized)     Problem List Patient Active Problem List   Diagnosis Date Noted   Arthritis of left shoulder region    S/P reverse total shoulder arthroplasty, 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   Vitamin D  deficiency 01/15/2021   Vitamin B12 deficiency 01/15/2021   Primary insomnia 01/15/2021   Morbid obesity ([place]) 01/15/2021   Moderate recurrent major depression ([place]) 01/15/2021   Benzodiazepine dependence ([place]) 01/15/2021   Port-A-Cath in place 07/22/2020   Left leg cellulitis 04/17/2020   Cellulitis of left leg 04/17/2020   Hypokalemia 04/17/2020   Macrocytic anemia 04/17/2020   Anxiety 04/17/2020   Depression 04/17/2020   Chronic diarrhea 04/17/2020   Chemotherapy induced diarrhea 04/17/2020   Malignant neoplasm of upper-inner quadrant of right breast in female, estrogen receptor positive ([place]) 12/26/2019   Encephalopathy 02/02/2018   Hypothyroidism 02/02/2018   AKI (acute kidney injury) ([place]) 02/02/2018   Chronic back pain 02/02/2018   Alcohol abuse 02/02/2018   Weight gain 02/02/2018   Benign essential HTN 02/02/2018    Debbe Odea, PT,DPT 11/05/2021, 3:02 PM  [facility] [address] [place], [place], [address] Phone: [phone]   Fax:  [phone]  Name: VERLE BRILLHART MRN: [id number] Date of Birth: 01/09/1951

## 2021-11-16 ENCOUNTER — Encounter: Payer: Self-pay | Admitting: Physical Therapy

## 2021-11-16 ENCOUNTER — Ambulatory Visit (INDEPENDENT_AMBULATORY_CARE_PROVIDER_SITE_OTHER): Payer: Medicare PPO | Admitting: Physical Therapy

## 2021-11-16 ENCOUNTER — Other Ambulatory Visit: Payer: Self-pay

## 2021-11-16 DIAGNOSIS — R296 Repeated falls: Secondary | ICD-10-CM

## 2021-11-16 DIAGNOSIS — G8929 Other chronic pain: Secondary | ICD-10-CM | POA: Diagnosis not present

## 2021-11-16 DIAGNOSIS — M6281 Muscle weakness (generalized): Secondary | ICD-10-CM

## 2021-11-16 DIAGNOSIS — R262 Difficulty in walking, not elsewhere classified: Secondary | ICD-10-CM

## 2021-11-16 DIAGNOSIS — M25561 Pain in right knee: Secondary | ICD-10-CM | POA: Diagnosis not present

## 2021-11-16 DIAGNOSIS — M545 Low back pain, unspecified: Secondary | ICD-10-CM

## 2021-11-16 DIAGNOSIS — M25512 Pain in left shoulder: Secondary | ICD-10-CM | POA: Diagnosis not present

## 2021-11-16 DIAGNOSIS — R2689 Other abnormalities of gait and mobility: Secondary | ICD-10-CM | POA: Diagnosis not present

## 2021-11-16 DIAGNOSIS — Z961 Presence of intraocular lens: Secondary | ICD-10-CM | POA: Diagnosis not present

## 2021-11-16 NOTE — Therapy (Signed)
[facility] [address] [place], [place], [address] Phone: [phone]   Fax:  [phone]  Physical Therapy Treatment/Recertification  Patient Details  Name: [name] MRN: [id number] Date of Birth: 1951-05-19 Referring Provider (PT): Donella Stade, [place]   Encounter Date: 11/16/2021  Referring diagnosis? M54.50 Treatment diagnosis? (if different than referring diagnosis) R26.89, M54.50, M25.561, M62.81, M25.512, R29.6, R26.2 What was this (referring dx) caused by? '[]'  Surgery '[x]'  Fall '[]'  Ongoing issue '[]'  Arthritis '[]'  Other: ____________  Laterality: '[]'  Rt '[]'  Lt '[x]'  Both  Check all possible CPT codes:      '[]'  97110 (Therapeutic Exercise)  '[]'  92507 (SLP Treatment)  '[]'  97112 (Neuro Re-ed)   '[]'  92526 (Swallowing Treatment)   '[]'  97116 (Gait Training)   '[]'  [id number] (Cognitive Training, 1st 15 minutes) '[]'  97140 (Manual Therapy)   '[]'  97130 (Cognitive Training, each add'l 15 minutes)  '[]'  97530 (Therapeutic Activities)  '[]'  Other, List CPT Code ____________    '[]'  97535 (Self Care)       '[x]'  All codes above (97110 - 97535)  '[]'  97012 (Mechanical Traction)  '[x]'  97014 (E-stim Unattended)  '[]'  97032 (E-stim manual)  '[]'  97033 (Ionto)  '[x]'  97035 (Ultrasound)  '[]'  97760 (Orthotic Fit) '[x]'  97750 (Physical Performance Training) '[]'  [id number] (Aquatic Therapy) '[]'  97034 (Contrast Bath) '[]'  [id number] (Paraffin) '[]'  97597 (Wound Care 1st 20 sq cm) '[]'  97598 (Wound Care each add'l 20 sq cm) '[]'  97016 (Vasopneumatic Device) '[]'  [phone] Comptroller) '[]'  [id number] (Prosthetic Training)    PT End of Session - 11/16/21 1354     Visit Number 13    Number of Visits 20    Date for PT Re-Evaluation 12/14/21    Authorization Type [employer]    Progress Note Due on Visit 20    PT Start Time 1300    PT Stop Time 1345    PT Time Calculation (min) 45 min    Activity Tolerance Patient tolerated treatment well;Patient limited by pain    Behavior During Therapy WFL for  tasks assessed/performed             Past Medical History:  Diagnosis Date   Anxiety    Atrial fibrillation [facility])    sees Dr. Audie Box   Cancer [facility])    breast   Chronic pain    Concussion 02/2007   ICU x 3 days   Depression    Hypertension    Hypothyroidism    Personal history of chemotherapy    Personal history of radiation therapy    Spinal stenosis    Thyroid disease ?1994    Past Surgical History:  Procedure Laterality Date   BREAST BIOPSY Right 12/20/2019   x2   BREAST LUMPECTOMY Right 01/22/2020   BREAST LUMPECTOMY WITH RADIOACTIVE SEED AND SENTINEL LYMPH NODE BIOPSY Right 01/22/2020   Procedure: RIGHT BREAST LUMPECTOMY WITH RADIOACTIVE SEED X2 AND RIGHT SENTINEL LYMPH NODE MAPPING;  Surgeon: Erroll Luna, MD;  Location: [place];  Service: General;  Laterality: Right;   BREAST SURGERY Right 03/1998   breast biopsy, benign   EYE SURGERY Right    PORT-A-CATH REMOVAL Right 04/30/2021   Procedure: REMOVAL PORT-A-CATH;  Surgeon: Erroll Luna, MD;  Location: [place];  Service: General;  Laterality: Right;   PORTACATH PLACEMENT Right 01/22/2020   Procedure: INSERTION PORT-A-CATH WITH ULTRASOUND;  Surgeon: Erroll Luna, MD;  Location: [place];  Service: General;  Laterality: Right;   PORTACATH PLACEMENT Right 02/28/2020   Procedure:  PORT A CATH REVISION;  Surgeon: Erroll Luna, MD;  Location: [place];  Service: General;  Laterality: Right;   REVERSE SHOULDER ARTHROPLASTY Left 02/02/2021   Procedure: LEFT REVERSE SHOULDER ARTHROPLASTY;  Surgeon: Meredith Pel, MD;  Location: [place];  Service: Orthopedics;  Laterality: Left;   SHOULDER SURGERY Right    SPINAL FUSION  03/04/2011   with ORIF    There were no vitals filed for this visit.   Subjective Assessment - 11/16/21 1303     Subjective had cataract surgery a few weeks ago and eye is giving her trouble.  she sees doc after this appt.    Pertinent  History anxiety, breast cancer with lumpectomy and radioactive seed placement 2021 and recent port removal, chronic pain, concussion 2008, HTN, depression, spinal fusion 2012.    Patient Stated Goals improve gait and balance and back pain    Currently in Pain? Yes    Pain Score 4     Pain Location Back    Pain Orientation Left    Pain Descriptors / Indicators Aching;Sharp    Pain Type Acute pain    Pain Onset More than a month ago    Pain Frequency Constant    Aggravating Factors  static positioning    Pain Relieving Factors changing positions, heat    Pain Onset More than a month ago                [facility] PT Assessment - 11/16/21 1319       Assessment   Medical Diagnosis LBP, Lt knee pain and weakness from Stress Fx,    Referring Provider (PT) Magnant, Gerrianne Scale, PA-C      Strength   Overall Strength Comments hip strength grossly 4+/5, knee strength grossly 5/5      Standardized Balance Assessment   Five times sit to stand comments  11.75      Timed Up and Go Test   Normal TUG (seconds) 9.25    TUG Comments without AD      Functional Gait  Assessment   Gait assessed  Yes    Gait Level Surface Walks 20 ft in less than 7 sec but greater than 5.5 sec, uses assistive device, slower speed, mild gait deviations, or deviates 6-10 in outside of the 12 in walkway width.    Change in Gait Speed Able to smoothly change walking speed without loss of balance or gait deviation. Deviate no more than 6 in outside of the 12 in walkway width.    Gait with Horizontal Head Turns Performs head turns smoothly with slight change in gait velocity (eg, minor disruption to smooth gait path), deviates 6-10 in outside 12 in walkway width, or uses an assistive device.    Gait with Vertical Head Turns Performs task with slight change in gait velocity (eg, minor disruption to smooth gait path), deviates 6 - 10 in outside 12 in walkway width or uses assistive device    Gait and Pivot Turn Pivot turns  safely within 3 sec and stops quickly with no loss of balance.    Step Over Obstacle Is able to step over one shoe box (4.5 in total height) without changing gait speed. No evidence of imbalance.    Gait with Narrow Base of Support Ambulates less than 4 steps heel to toe or cannot perform without assistance.    Gait with Eyes Closed Walks 20 ft, slow speed, abnormal gait pattern, evidence for imbalance, deviates 10-15 in outside 12 in  walkway width. Requires more than 9 sec to ambulate 20 ft.    Ambulating Backwards Walks 20 ft, uses assistive device, slower speed, mild gait deviations, deviates 6-10 in outside 12 in walkway width.    Steps Alternating feet, must use rail.    Total Score 19                           [facility] Adult PT Treatment/Exercise - 11/16/21 1304       Lumbar Exercises: Aerobic   Nustep L7 x 15 min      Lumbar Exercises: Machines for Strengthening   Leg Press 112# 3X10, SL 50# X20 bilat                       PT Short Term Goals - 10/21/21 1411       PT SHORT TERM GOAL #1   Title independent with intial HEP    Baseline met 10/19 but continue to progress    Time 4    Period Weeks    Status Achieved    Target Date 10/08/21      PT SHORT TERM GOAL #2   Title improve TUG to less than 17 seconds    Baseline Met 10/19, will make new goal for this    Time 4    Period Weeks    Status Achieved    Target Date 10/08/21      PT SHORT TERM GOAL #3   Title Pt will improve TUG time to less than 11 seconds without AD to improve gait speed and balance.    Baseline 10.45 on 11/2    Time 4    Period Weeks    Status Achieved    Target Date 11/04/21               PT Long Term Goals - 11/16/21 1355       PT LONG TERM GOAL #1   Title She will improve BERG balance score to 46 or more to show improved balance    Baseline scored 51 today 11/2    Time 12    Period Weeks    Status Achieved      PT LONG TERM GOAL #2   Title She will  improve TUG score to <10 seconds  to show improved balance and walk speed.    Baseline improved to 10.45 on 11/2    Period Weeks    Status Achieved      PT LONG TERM GOAL #3   Title report back pain < 3/10 with activity for improved function    Baseline depends, can still get up to 5 at times    Time 4    Period Weeks    Status On-going    Target Date 12/14/21      PT LONG TERM GOAL #4   Title Improve bilat knee/hip strength to 5/5 MMT tested in sitting    Baseline now 4+ for hip and 5 for knee    Time 12    Period Weeks    Status On-going    Target Date 12/14/21      PT LONG TERM GOAL #5   Title improve FGA to at least 24/30 for improved mobility and balance    Baseline 19/24    Time 4    Period Weeks    Status New    Target Date 12/14/21      PT LONG  TERM GOAL #6   Title -      PT LONG TERM GOAL #7   Title -                   Plan - 11/16/21 1356     Clinical Impression Statement Pt has demonstrated progress towards all LTGs meeting all except pain and strength goals.  She still has some difficulty with dynamic balance activities as demonstrated by FGA of 19/30.  Recommend continued PT 2x/wk x 4 wks to address remaining goals.    Personal Factors and Comorbidities Comorbidity 3+    Comorbidities anxiety, breast cancer with lumpectomy and radioactive seed placement 2021 and recent port removal, chronic pain, concussion 2008, HTN, depression, spinal fusion 2012    Examination-Activity Limitations Locomotion Level;Reach Overhead;Dressing;Hygiene/Grooming;Lift;Toileting;Bathing    Examination-Participation Restrictions Cleaning;Meal Prep;Community Activity;Driving;Laundry    Stability/Clinical Decision Making Evolving/Moderate complexity    Rehab Potential Fair    PT Frequency 2x / week    PT Duration 4 weeks    PT Treatment/Interventions ADLs/Self Care Home Management;Therapeutic exercise;Patient/family education;Cryotherapy;DME Instruction;Ultrasound;Moist  Heat;Functional mobility training;Therapeutic activities;Balance training;Neuromuscular re-education;Manual techniques;Vasopneumatic Device;Taping;Dry needling;Passive range of motion;Electrical Stimulation;Gait training    PT Next Visit Plan continue high level balance work, LE strengthening and manual/modalities PRN    PT Home Exercise Plan Access Code: [email or web address], added tandem balance and SLS    Consulted and Agree with Plan of Care Patient             Patient will benefit from skilled therapeutic intervention in order to improve the following deficits and impairments:  Postural dysfunction, Decreased range of motion, Decreased knowledge of precautions, Increased edema, Impaired UE functional use, Decreased strength, Decreased mobility, Decreased balance, Pain, Difficulty walking, Decreased activity tolerance, Decreased endurance  Visit Diagnosis: Other abnormalities of gait and mobility - Plan: PT plan of care cert/re-cert  Chronic bilateral low back pain without sciatica - Plan: PT plan of care cert/re-cert  Acute pain of right knee - Plan: PT plan of care cert/re-cert  Muscle weakness (generalized) - Plan: PT plan of care cert/re-cert  Acute pain of left shoulder - Plan: PT plan of care cert/re-cert  Repeated falls - Plan: PT plan of care cert/re-cert  Difficulty in walking, not elsewhere classified - Plan: PT plan of care cert/re-cert     Problem List Patient Active Problem List   Diagnosis Date Noted   Arthritis of left shoulder region    S/P reverse total shoulder arthroplasty, 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   Vitamin D deficiency 01/15/2021   Vitamin B12 deficiency 01/15/2021   Primary insomnia 01/15/2021   Morbid obesity ([place]) 01/15/2021   Moderate recurrent major depression ([place]) 01/15/2021   Benzodiazepine dependence ([place]) 01/15/2021   Port-A-Cath in place 07/22/2020   Left leg cellulitis 04/17/2020   Cellulitis of left leg 04/17/2020   Hypokalemia 04/17/2020   Macrocytic  anemia 04/17/2020   Anxiety 04/17/2020   Depression 04/17/2020   Chronic diarrhea 04/17/2020   Chemotherapy induced diarrhea 04/17/2020   Malignant neoplasm of upper-inner quadrant of right breast in female, estrogen receptor positive ([place]) 12/26/2019   Encephalopathy 02/02/2018   Hypothyroidism 02/02/2018   AKI (acute kidney injury) ([place]) 02/02/2018   Chronic back pain 02/02/2018   Alcohol abuse 02/02/2018   Weight gain 02/02/2018   Benign essential HTN 02/02/2018     Laureen Abrahams, PT, DPT 11/16/21 2:03 PM    [facility] [address] [place], [place], [address] Phone: [phone]   Fax:  [phone]  Name: [name] MRN: [id number] Date of Birth: Jul 18, 1951

## 2021-11-17 DIAGNOSIS — H11423 Conjunctival edema, bilateral: Secondary | ICD-10-CM | POA: Diagnosis not present

## 2021-11-18 ENCOUNTER — Encounter: Payer: Medicare PPO | Admitting: Physical Therapy

## 2021-11-23 ENCOUNTER — Encounter: Payer: Medicare PPO | Admitting: Physical Therapy

## 2021-11-23 DIAGNOSIS — H11423 Conjunctival edema, bilateral: Secondary | ICD-10-CM | POA: Diagnosis not present

## 2021-11-25 ENCOUNTER — Other Ambulatory Visit: Payer: Self-pay

## 2021-11-25 ENCOUNTER — Ambulatory Visit (INDEPENDENT_AMBULATORY_CARE_PROVIDER_SITE_OTHER): Payer: Medicare PPO | Admitting: Physical Therapy

## 2021-11-25 DIAGNOSIS — R2689 Other abnormalities of gait and mobility: Secondary | ICD-10-CM | POA: Diagnosis not present

## 2021-11-25 DIAGNOSIS — M6281 Muscle weakness (generalized): Secondary | ICD-10-CM | POA: Diagnosis not present

## 2021-11-25 DIAGNOSIS — M545 Low back pain, unspecified: Secondary | ICD-10-CM

## 2021-11-25 DIAGNOSIS — G8929 Other chronic pain: Secondary | ICD-10-CM | POA: Diagnosis not present

## 2021-11-25 DIAGNOSIS — M25561 Pain in right knee: Secondary | ICD-10-CM | POA: Diagnosis not present

## 2021-11-25 NOTE — Therapy (Signed)
[facility] [address] [place], [place], [address] Phone: [phone]   Fax:  [phone]  Physical Therapy Treatment  Patient Details  Name: [name] MRN: [id number] Date of Birth: 05/07/1951 Referring Provider (PT): Magnant, Charles L, PA-C   Encounter Date: 11/25/2021   PT End of Session - 11/25/21 1445     Visit Number 14    Number of Visits 20    Date for PT Re-Evaluation 12/14/21    Authorization Type [employer]    Authorization - Visit Number 1    Authorization - Number of Visits 12    Progress Note Due on Visit 20    PT Start Time 1351    PT Stop Time 1430    PT Time Calculation (min) 39 min    Activity Tolerance Patient tolerated treatment well    Behavior During Therapy WFL for tasks assessed/performed             Past Medical History:  Diagnosis Date   Anxiety    Atrial fibrillation ([facility])    sees Dr. O'Neal   Cancer ([facility])    breast   Chronic pain    Concussion 02/2007   ICU x 3 days   Depression    Hypertension    Hypothyroidism    Personal history of chemotherapy    Personal history of radiation therapy    Spinal stenosis    Thyroid disease ?1994    Past Surgical History:  Procedure Laterality Date   BREAST BIOPSY Right 12/20/2019   x2   BREAST LUMPECTOMY Right 01/22/2020   BREAST LUMPECTOMY WITH RADIOACTIVE SEED AND SENTINEL LYMPH NODE BIOPSY Right 01/22/2020   Procedure: RIGHT BREAST LUMPECTOMY WITH RADIOACTIVE SEED X2 AND RIGHT SENTINEL LYMPH NODE MAPPING;  Surgeon: Cornett, Thomas, MD;  Location: [facility];  Service: General;  Laterality: Right;   BREAST SURGERY Right 03/1998   breast biopsy, benign   EYE SURGERY Right    PORT-A-CATH REMOVAL Right 04/30/2021   Procedure: REMOVAL PORT-A-CATH;  Surgeon: Cornett, Thomas, MD;  Location: [facility];  Service: General;  Laterality: Right;   PORTACATH PLACEMENT Right 01/22/2020   Procedure: INSERTION PORT-A-CATH WITH  ULTRASOUND;  Surgeon: Cornett, Thomas, MD;  Location: [facility];  Service: General;  Laterality: Right;   PORTACATH PLACEMENT Right 02/28/2020   Procedure: PORT A CATH REVISION;  Surgeon: Cornett, Thomas, MD;  [facility];  Service: General;  Laterality: Right;   REVERSE SHOULDER ARTHROPLASTY Left 02/02/2021   Procedure: LEFT REVERSE SHOULDER ARTHROPLASTY;  Surgeon: Dean, Gregory Scott, MD;  Location[facility];  Service: Orthopedics;  Laterality: Left;   SHOULDER SURGERY Right    SPINAL FUSION  03/04/2011   with ORIF    There were no vitals filed for this visit.   Subjective Assessment - 11/25/21 1444     Subjective relays she has been having eye issues with viral infection but she is on meds now and seeing MD for this. She says it is actually a good day for her back and knee pain, she does still feel off balance though.    Pertinent History anxiety, breast cancer with lumpectomy and radioactive seed placement 2021 and recent port removal, chronic pain, concussion 2008, HTN, depression, spinal fusion 2012.    Patient Stated Goals improve gait and balance and back pain    Pain Onset More than a month ago    Pain Onset More than a month ago                                 [facility] Adult PT Treatment/Exercise - 11/25/21 0001       Neuro Re-ed    Neuro Re-ed Details  tandem walk 4 round trips, walking with head turns and head nods 3 round trips ea, weaving through cones 3 round trips, stepping over cones 3 round trips      Lumbar Exercises: Aerobic   Nustep L7 x 15 min      Lumbar Exercises: Machines for Strengthening   Leg Press 112# 3X10, SL 50# X30 bilat                       PT Short Term Goals - 10/21/21 1411       PT SHORT TERM GOAL #1   Title independent with intial HEP    Baseline met 10/19 but continue to progress    Time 4    Period Weeks    Status Achieved    Target Date 10/08/21      PT SHORT TERM GOAL #2   Title  improve TUG to less than 17 seconds    Baseline Met 10/19, will make new goal for this    Time 4    Period Weeks    Status Achieved    Target Date 10/08/21      PT SHORT TERM GOAL #3   Title Pt will improve TUG time to less than 11 seconds without AD to improve gait speed and balance.    Baseline 10.45 on 11/2    Time 4    Period Weeks    Status Achieved    Target Date 11/04/21               PT Long Term Goals - 11/16/21 1355       PT LONG TERM GOAL #1   Title She will improve BERG balance score to 46 or more to show improved balance    Baseline scored 51 today 11/2    Time 12    Period Weeks    Status Achieved      PT LONG TERM GOAL #2   Title She will improve TUG score to <10 seconds  to show improved balance and walk speed.    Baseline improved to 10.45 on 11/2    Period Weeks    Status Achieved      PT LONG TERM GOAL #3   Title report back pain < 3/10 with activity for improved function    Baseline depends, can still get up to 5 at times    Time 4    Period Weeks    Status On-going    Target Date 12/14/21      PT LONG TERM GOAL #4   Title Improve bilat knee/hip strength to 5/5 MMT tested in sitting    Baseline now 4+ for hip and 5 for knee    Time 12    Period Weeks    Status On-going    Target Date 12/14/21      PT LONG TERM GOAL #5   Title improve FGA to at least 24/30 for improved mobility and balance    Baseline 19/24    Time 4    Period Weeks    Status New    Target Date 12/14/21      PT LONG TERM GOAL #6   Title -      PT LONG TERM GOAL #7   Title -  Plan - 11/25/21 1502     Clinical Impression Statement Continued to work to improve her overall strength, balance, and gait. She had improved overall activity tolerance noted today. Continue POC    Personal Factors and Comorbidities Comorbidity 3+    Comorbidities anxiety, breast cancer with lumpectomy and radioactive seed placement 2021 and recent port removal,  chronic pain, concussion 2008, HTN, depression, spinal fusion 2012    Examination-Activity Limitations Locomotion Level;Reach Overhead;Dressing;Hygiene/Grooming;Lift;Toileting;Bathing    Examination-Participation Restrictions Cleaning;Meal Prep;Community Activity;Driving;Laundry    Stability/Clinical Decision Making Evolving/Moderate complexity    Rehab Potential Fair    PT Frequency 2x / week    PT Duration 4 weeks    PT Treatment/Interventions ADLs/Self Care Home Management;Therapeutic exercise;Patient/family education;Cryotherapy;DME Instruction;Ultrasound;Moist Heat;Functional mobility training;Therapeutic activities;Balance training;Neuromuscular re-education;Manual techniques;Vasopneumatic Device;Taping;Dry needling;Passive range of motion;Electrical Stimulation;Gait training    PT Next Visit Plan continue high level balance work, LE strengthening and manual/modalities PRN    PT Home Exercise Plan Access Code: [email or web address], added tandem balance and SLS    Consulted and Agree with Plan of Care Patient             Patient will benefit from skilled therapeutic intervention in order to improve the following deficits and impairments:  Postural dysfunction, Decreased range of motion, Decreased knowledge of precautions, Increased edema, Impaired UE functional use, Decreased strength, Decreased mobility, Decreased balance, Pain, Difficulty walking, Decreased activity tolerance, Decreased endurance  Visit Diagnosis: Other abnormalities of gait and mobility  Chronic bilateral low back pain without sciatica  Acute pain of right knee  Muscle weakness (generalized)     Problem List Patient Active Problem List   Diagnosis Date Noted   Arthritis of left shoulder region    S/P reverse total shoulder arthroplasty, 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   Vitamin D deficiency 01/15/2021   Vitamin B12 deficiency 01/15/2021   Primary insomnia 01/15/2021   Morbid obesity ([place]) 01/15/2021   Moderate recurrent major  depression ([place]) 01/15/2021   Benzodiazepine dependence ([place]) 01/15/2021   Port-A-Cath in place 07/22/2020   Left leg cellulitis 04/17/2020   Cellulitis of left leg 04/17/2020   Hypokalemia 04/17/2020   Macrocytic anemia 04/17/2020   Anxiety 04/17/2020   Depression 04/17/2020   Chronic diarrhea 04/17/2020   Chemotherapy induced diarrhea 04/17/2020   Malignant neoplasm of upper-inner quadrant of right breast in female, estrogen receptor positive ([place]) 12/26/2019   Encephalopathy 02/02/2018   Hypothyroidism 02/02/2018   AKI (acute kidney injury) ([place]) 02/02/2018   Chronic back pain 02/02/2018   Alcohol abuse 02/02/2018   Weight gain 02/02/2018   Benign essential HTN 02/02/2018    Debbe Odea, PT,DPT 11/25/2021, 3:14 PM  [facility] [address] [place], [place], [address] Phone: [phone]   Fax:  [phone]  Name: [name] MRN: [id number] Date of Birth: 11/28/1951

## 2021-11-30 ENCOUNTER — Encounter: Payer: Self-pay | Admitting: Physical Therapy

## 2021-11-30 ENCOUNTER — Other Ambulatory Visit: Payer: Self-pay

## 2021-11-30 ENCOUNTER — Ambulatory Visit (INDEPENDENT_AMBULATORY_CARE_PROVIDER_SITE_OTHER): Payer: Medicare PPO | Admitting: Physical Therapy

## 2021-11-30 DIAGNOSIS — M25561 Pain in right knee: Secondary | ICD-10-CM | POA: Diagnosis not present

## 2021-11-30 DIAGNOSIS — G8929 Other chronic pain: Secondary | ICD-10-CM

## 2021-11-30 DIAGNOSIS — M545 Low back pain, unspecified: Secondary | ICD-10-CM | POA: Diagnosis not present

## 2021-11-30 DIAGNOSIS — M6281 Muscle weakness (generalized): Secondary | ICD-10-CM | POA: Diagnosis not present

## 2021-11-30 DIAGNOSIS — R2689 Other abnormalities of gait and mobility: Secondary | ICD-10-CM | POA: Diagnosis not present

## 2021-11-30 NOTE — Therapy (Signed)
[facility] [address] [place], [place], [address] Phone: [phone]   Fax:  [phone]  Physical Therapy Treatment  Patient Details  Name: [name] MRN: [id number] Date of Birth: 08-Dec-1951 Referring Provider (PT): Donella Stade, [place]   Encounter Date: 11/30/2021   PT End of Session - 11/30/21 1333     Visit Number 15    Number of Visits 20    Date for PT Re-Evaluation 12/14/21    Authorization Type [employer]    Authorization - Visit Number 3    Authorization - Number of Visits 12    Progress Note Due on Visit 20    PT Start Time 1300    PT Stop Time 1343    PT Time Calculation (min) 43 min    Activity Tolerance Patient tolerated treatment well    Behavior During Therapy [facility] for tasks assessed/performed             Past Medical History:  Diagnosis Date   Anxiety    Atrial fibrillation [facility])    sees Dr. Audie Box   Cancer [facility])    breast   Chronic pain    Concussion 02/2007   ICU x 3 days   Depression    Hypertension    Hypothyroidism    Personal history of chemotherapy    Personal history of radiation therapy    Spinal stenosis    Thyroid disease ?1994    Past Surgical History:  Procedure Laterality Date   BREAST BIOPSY Right 12/20/2019   x2   BREAST LUMPECTOMY Right 01/22/2020   BREAST LUMPECTOMY WITH RADIOACTIVE SEED AND SENTINEL LYMPH NODE BIOPSY Right 01/22/2020   Procedure: RIGHT BREAST LUMPECTOMY WITH RADIOACTIVE SEED X2 AND RIGHT SENTINEL LYMPH NODE MAPPING;  Surgeon: Erroll Luna, MD;  Location: [place];  Service: General;  Laterality: Right;   BREAST SURGERY Right 03/1998   breast biopsy, benign   EYE SURGERY Right    PORT-A-CATH REMOVAL Right 04/30/2021   Procedure: REMOVAL PORT-A-CATH;  Surgeon: Erroll Luna, MD;  Location: [place];  Service: General;  Laterality: Right;   PORTACATH PLACEMENT Right 01/22/2020   Procedure: INSERTION PORT-A-CATH WITH  ULTRASOUND;  Surgeon: Erroll Luna, MD;  Location: [place];  Service: General;  Laterality: Right;   PORTACATH PLACEMENT Right 02/28/2020   Procedure: PORT A CATH REVISION;  Surgeon: Erroll Luna, MD;  Location: [place];  Service: General;  Laterality: Right;   REVERSE SHOULDER ARTHROPLASTY Left 02/02/2021   Procedure: LEFT REVERSE SHOULDER ARTHROPLASTY;  Surgeon: Meredith Pel, MD;  Location: [place];  Service: Orthopedics;  Laterality: Left;   SHOULDER SURGERY Right    SPINAL FUSION  03/04/2011   with ORIF    There were no vitals filed for this visit.   Subjective Assessment - 11/30/21 1330     Subjective relays she had more pain this morning when she woke up, but now her pain is much better.    Pertinent History anxiety, breast cancer with lumpectomy and radioactive seed placement 2021 and recent port removal, chronic pain, concussion 2008, HTN, depression, spinal fusion 2012.    Patient Stated Goals improve gait and balance and back pain    Pain Onset More than a month ago    Pain Onset More than a month ago              [facility] Adult PT Treatment/Exercise - 11/30/21 0001       Ambulation/Gait   Ambulation/Gait  Yes    Ambulation/Gait Assistance 7: Independent    Ambulation Distance (Feet) 150 Feet    Assistive device None      Neuro Re-ed    Neuro Re-ed Details  rocker board A-P and lateral 2 min each, walking on foam beam fwd and lateral X 4 ea      Lumbar Exercises: Aerobic   Nustep L7 x 15 min      Lumbar Exercises: Machines for Strengthening   Leg Press 112# 3X10, SL 50# X30 bilat                       PT Short Term Goals - 10/21/21 1411       PT SHORT TERM GOAL #1   Title independent with intial HEP    Baseline met 10/19 but continue to progress    Time 4    Period Weeks    Status Achieved    Target Date 10/08/21      PT SHORT TERM GOAL #2   Title improve TUG to less than 17 seconds    Baseline Met 10/19, will  make new goal for this    Time 4    Period Weeks    Status Achieved    Target Date 10/08/21      PT SHORT TERM GOAL #3   Title Pt will improve TUG time to less than 11 seconds without AD to improve gait speed and balance.    Baseline 10.45 on 11/2    Time 4    Period Weeks    Status Achieved    Target Date 11/04/21               PT Long Term Goals - 11/16/21 1355       PT LONG TERM GOAL #1   Title She will improve BERG balance score to 46 or more to show improved balance    Baseline scored 51 today 11/2    Time 12    Period Weeks    Status Achieved      PT LONG TERM GOAL #2   Title She will improve TUG score to <10 seconds  to show improved balance and walk speed.    Baseline improved to 10.45 on 11/2    Period Weeks    Status Achieved      PT LONG TERM GOAL #3   Title report back pain < 3/10 with activity for improved function    Baseline depends, can still get up to 5 at times    Time 4    Period Weeks    Status On-going    Target Date 12/14/21      PT LONG TERM GOAL #4   Title Improve bilat knee/hip strength to 5/5 MMT tested in sitting    Baseline now 4+ for hip and 5 for knee    Time 12    Period Weeks    Status On-going    Target Date 12/14/21      PT LONG TERM GOAL #5   Title improve FGA to at least 24/30 for improved mobility and balance    Baseline 19/24    Time 4    Period Weeks    Status New    Target Date 12/14/21      PT LONG TERM GOAL #6   Title -      PT LONG TERM GOAL #7   Title -  Plan - 11/30/21 1343     Clinical Impression Statement She is improving with ambulation and now not using AD. We progressed her balance challenges today and she did have some difficulty with this, we will work to continue to challenge and improve her dynamic balance.    Personal Factors and Comorbidities Comorbidity 3+    Comorbidities anxiety, breast cancer with lumpectomy and radioactive seed placement 2021 and recent port  removal, chronic pain, concussion 2008, HTN, depression, spinal fusion 2012    Examination-Activity Limitations Locomotion Level;Reach Overhead;Dressing;Hygiene/Grooming;Lift;Toileting;Bathing    Examination-Participation Restrictions Cleaning;Meal Prep;Community Activity;Driving;Laundry    Stability/Clinical Decision Making Evolving/Moderate complexity    Rehab Potential Fair    PT Frequency 2x / week    PT Duration 4 weeks    PT Treatment/Interventions ADLs/Self Care Home Management;Therapeutic exercise;Patient/family education;Cryotherapy;DME Instruction;Ultrasound;Moist Heat;Functional mobility training;Therapeutic activities;Balance training;Neuromuscular re-education;Manual techniques;Vasopneumatic Device;Taping;Dry needling;Passive range of motion;Electrical Stimulation;Gait training    PT Next Visit Plan continue high level balance work, LE strengthening and manual/modalities PRN    PT Home Exercise Plan Access Code: [email or web address], added tandem balance and SLS    Consulted and Agree with Plan of Care Patient             Patient will benefit from skilled therapeutic intervention in order to improve the following deficits and impairments:  Postural dysfunction, Decreased range of motion, Decreased knowledge of precautions, Increased edema, Impaired UE functional use, Decreased strength, Decreased mobility, Decreased balance, Pain, Difficulty walking, Decreased activity tolerance, Decreased endurance  Visit Diagnosis: Other abnormalities of gait and mobility  Chronic bilateral low back pain without sciatica  Acute pain of right knee  Muscle weakness (generalized)     Problem List Patient Active Problem List   Diagnosis Date Noted   Arthritis of left shoulder region    S/P reverse total shoulder arthroplasty, 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   Vitamin D deficiency 01/15/2021   Vitamin B12 deficiency 01/15/2021   Primary insomnia 01/15/2021   Morbid obesity ([place]) 01/15/2021   Moderate recurrent  major depression ([place]) 01/15/2021   Benzodiazepine dependence ([place]) 01/15/2021   Port-A-Cath in place 07/22/2020   Left leg cellulitis 04/17/2020   Cellulitis of left leg 04/17/2020   Hypokalemia 04/17/2020   Macrocytic anemia 04/17/2020   Anxiety 04/17/2020   Depression 04/17/2020   Chronic diarrhea 04/17/2020   Chemotherapy induced diarrhea 04/17/2020   Malignant neoplasm of upper-inner quadrant of right breast in female, estrogen receptor positive ([place]) 12/26/2019   Encephalopathy 02/02/2018   Hypothyroidism 02/02/2018   AKI (acute kidney injury) ([place]) 02/02/2018   Chronic back pain 02/02/2018   Alcohol abuse 02/02/2018   Weight gain 02/02/2018   Benign essential HTN 02/02/2018    Debbe Odea, PT,DPT 11/30/2021, 1:44 PM  [facility] [address] [place], [place], [address] Phone: [phone]   Fax:  [phone]  Name: [name] MRN: [id number] Date of Birth: June 01, 1951

## 2021-12-02 ENCOUNTER — Ambulatory Visit (INDEPENDENT_AMBULATORY_CARE_PROVIDER_SITE_OTHER): Payer: Medicare PPO | Admitting: Physical Therapy

## 2021-12-02 ENCOUNTER — Other Ambulatory Visit: Payer: Self-pay

## 2021-12-02 DIAGNOSIS — G8929 Other chronic pain: Secondary | ICD-10-CM

## 2021-12-02 DIAGNOSIS — M545 Low back pain, unspecified: Secondary | ICD-10-CM | POA: Diagnosis not present

## 2021-12-02 DIAGNOSIS — M6281 Muscle weakness (generalized): Secondary | ICD-10-CM | POA: Diagnosis not present

## 2021-12-02 DIAGNOSIS — M25561 Pain in right knee: Secondary | ICD-10-CM

## 2021-12-02 DIAGNOSIS — R2689 Other abnormalities of gait and mobility: Secondary | ICD-10-CM

## 2021-12-02 NOTE — Therapy (Signed)
[facility] [address] [place], [place], [address] Phone: [phone]   Fax:  [phone]  Physical Therapy Treatment  Patient Details  Name: WILDA WETHERELL MRN: [id number] Date of Birth: 02-Mar-1951 Referring Provider (PT): Donella Stade, [place]   Encounter Date: 12/02/2021   PT End of Session - 12/02/21 1153     Visit Number 16    Number of Visits 20    Date for PT Re-Evaluation 12/14/21    Authorization Type [employer]    Authorization - Visit Number 4    Authorization - Number of Visits 12    Progress Note Due on Visit 20    PT Start Time 0051    PT Stop Time 1236    PT Time Calculation (min) 51 min    Activity Tolerance Patient tolerated treatment well    Behavior During Therapy [facility] for tasks assessed/performed             Past Medical History:  Diagnosis Date   Anxiety    Atrial fibrillation [facility])    sees Dr. Audie Box   Cancer [facility])    breast   Chronic pain    Concussion 02/2007   ICU x 3 days   Depression    Hypertension    Hypothyroidism    Personal history of chemotherapy    Personal history of radiation therapy    Spinal stenosis    Thyroid disease ?1994    Past Surgical History:  Procedure Laterality Date   BREAST BIOPSY Right 12/20/2019   x2   BREAST LUMPECTOMY Right 01/22/2020   BREAST LUMPECTOMY WITH RADIOACTIVE SEED AND SENTINEL LYMPH NODE BIOPSY Right 01/22/2020   Procedure: RIGHT BREAST LUMPECTOMY WITH RADIOACTIVE SEED X2 AND RIGHT SENTINEL LYMPH NODE MAPPING;  Surgeon: Erroll Luna, MD;  Location: [place];  Service: General;  Laterality: Right;   BREAST SURGERY Right 03/1998   breast biopsy, benign   EYE SURGERY Right    PORT-A-CATH REMOVAL Right 04/30/2021   Procedure: REMOVAL PORT-A-CATH;  Surgeon: Erroll Luna, MD;  Location: [place];  Service: General;  Laterality: Right;   PORTACATH PLACEMENT Right 01/22/2020   Procedure: INSERTION PORT-A-CATH WITH  ULTRASOUND;  Surgeon: Erroll Luna, MD;  Location: [place];  Service: General;  Laterality: Right;   PORTACATH PLACEMENT Right 02/28/2020   Procedure: PORT A CATH REVISION;  Surgeon: Erroll Luna, MD;  Location: [place];  Service: General;  Laterality: Right;   REVERSE SHOULDER ARTHROPLASTY Left 02/02/2021   Procedure: LEFT REVERSE SHOULDER ARTHROPLASTY;  Surgeon: Meredith Pel, MD;  Location: [place];  Service: Orthopedics;  Laterality: Left;   SHOULDER SURGERY Right    SPINAL FUSION  03/04/2011   with ORIF    There were no vitals filed for this visit.   Subjective Assessment - 12/02/21 1152     Subjective she relays she woke up with 10/10 low back pain today and not sure why, she has not been sleeping well either.    Pertinent History anxiety, breast cancer with lumpectomy and radioactive seed placement 2021 and recent port removal, chronic pain, concussion 2008, HTN, depression, spinal fusion 2012.    Patient Stated Goals improve gait and balance and back pain    Pain Onset More than a month ago    Pain Onset More than a month ago  [facility] Adult PT Treatment/Exercise - 12/02/21 0001       Ambulation/Gait   Ambulation/Gait Yes    Ambulation/Gait Assistance 7: Independent    Ambulation Distance (Feet) 150 Feet    Assistive device None    Gait Comments slower velocity today due to pain      Neuro Re-ed    Neuro Re-ed Details  in //bars round trips: walking on foam beam fwd and lateral X 3 ea, walking with head turns and head nods X 2 ea, walking with eyes closed X 2 ea      Lumbar Exercises: Aerobic   Nustep L6 x 15 min      Lumbar Exercises: Machines for Strengthening   Leg Press 112# 3X10, SL 50# X30 bilat      Manual Therapy   Manual therapy comments compression and skilled palpation during DN              Trigger Point Dry Needling - 12/02/21 0001     Consent Given? Yes    Education  Handout Provided Previously provided    Muscles Treated Back/Hip Quadratus lumborum;Erector spinae;Lumbar multifidi    Dry Needling Comments good overall tolerance noted, no immediate adverse reaction, trialed Estim today milli amp current L3                     PT Short Term Goals - 10/21/21 1411       PT SHORT TERM GOAL #1   Title independent with intial HEP    Baseline met 10/19 but continue to progress    Time 4    Period Weeks    Status Achieved    Target Date 10/08/21      PT SHORT TERM GOAL #2   Title improve TUG to less than 17 seconds    Baseline Met 10/19, will make new goal for this    Time 4    Period Weeks    Status Achieved    Target Date 10/08/21      PT SHORT TERM GOAL #3   Title Pt will improve TUG time to less than 11 seconds without AD to improve gait speed and balance.    Baseline 10.45 on 11/2    Time 4    Period Weeks    Status Achieved    Target Date 11/04/21               PT Long Term Goals - 11/16/21 1355       PT LONG TERM GOAL #1   Title She will improve BERG balance score to 46 or more to show improved balance    Baseline scored 51 today 11/2    Time 12    Period Weeks    Status Achieved      PT LONG TERM GOAL #2   Title She will improve TUG score to <10 seconds  to show improved balance and walk speed.    Baseline improved to 10.45 on 11/2    Period Weeks    Status Achieved      PT LONG TERM GOAL #3   Title report back pain < 3/10 with activity for improved function    Baseline depends, can still get up to 5 at times    Time 4    Period Weeks    Status On-going    Target Date 12/14/21      PT LONG TERM GOAL #4   Title Improve bilat knee/hip strength to 5/5 MMT  tested in sitting    Baseline now 4+ for hip and 5 for knee    Time 12    Period Weeks    Status On-going    Target Date 12/14/21      PT LONG TERM GOAL #5   Title improve FGA to at least 24/30 for improved mobility and balance    Baseline 19/24     Time 4    Period Weeks    Status New    Target Date 12/14/21      PT LONG TERM GOAL #6   Title -      PT LONG TERM GOAL #7   Title -                   Plan - 12/02/21 1219     Clinical Impression Statement Session somewhat limited by back pain today and she was more unsteady on her feet than last visit.  We did perform DN to her lumbar to reduce pain and trialed Estim with this. PT recommending to continue POC.    Personal Factors and Comorbidities Comorbidity 3+    Comorbidities anxiety, breast cancer with lumpectomy and radioactive seed placement 2021 and recent port removal, chronic pain, concussion 2008, HTN, depression, spinal fusion 2012    Examination-Activity Limitations Locomotion Level;Reach Overhead;Dressing;Hygiene/Grooming;Lift;Toileting;Bathing    Examination-Participation Restrictions Cleaning;Meal Prep;Community Activity;Driving;Laundry    Stability/Clinical Decision Making Evolving/Moderate complexity    Rehab Potential Fair    PT Frequency 2x / week    PT Duration 4 weeks    PT Treatment/Interventions ADLs/Self Care Home Management;Therapeutic exercise;Patient/family education;Cryotherapy;DME Instruction;Ultrasound;Moist Heat;Functional mobility training;Therapeutic activities;Balance training;Neuromuscular re-education;Manual techniques;Vasopneumatic Device;Taping;Dry needling;Passive range of motion;Electrical Stimulation;Gait training    PT Next Visit Plan continue high level balance work, LE strengthening and manual/modalities PRN    PT Home Exercise Plan Access Code: [email or web address], added tandem balance and SLS    Consulted and Agree with Plan of Care Patient             Patient will benefit from skilled therapeutic intervention in order to improve the following deficits and impairments:  Postural dysfunction, Decreased range of motion, Decreased knowledge of precautions, Increased edema, Impaired UE functional use, Decreased strength, Decreased  mobility, Decreased balance, Pain, Difficulty walking, Decreased activity tolerance, Decreased endurance  Visit Diagnosis: Other abnormalities of gait and mobility  Chronic bilateral low back pain without sciatica  Acute pain of right knee  Muscle weakness (generalized)     Problem List Patient Active Problem List   Diagnosis Date Noted   Arthritis of left shoulder region    S/P reverse total shoulder arthroplasty, 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   Vitamin D deficiency 01/15/2021   Vitamin B12 deficiency 01/15/2021   Primary insomnia 01/15/2021   Morbid obesity ([place]) 01/15/2021   Moderate recurrent major depression ([place]) 01/15/2021   Benzodiazepine dependence ([place]) 01/15/2021   Port-A-Cath in place 07/22/2020   Left leg cellulitis 04/17/2020   Cellulitis of left leg 04/17/2020   Hypokalemia 04/17/2020   Macrocytic anemia 04/17/2020   Anxiety 04/17/2020   Depression 04/17/2020   Chronic diarrhea 04/17/2020   Chemotherapy induced diarrhea 04/17/2020   Malignant neoplasm of upper-inner quadrant of right breast in female, estrogen receptor positive ([place]) 12/26/2019   Encephalopathy 02/02/2018   Hypothyroidism 02/02/2018   AKI (acute kidney injury) ([place]) 02/02/2018   Chronic back pain 02/02/2018   Alcohol abuse 02/02/2018   Weight gain 02/02/2018   Benign essential HTN 02/02/2018    Debbe Odea, PT,DPT 12/02/2021, 1:05  PM  [facility] Physical [facility] [address] [place], [place], [address] Phone: [phone]   Fax:  [phone]  Name: DAISHIA FETTERLY MRN: [id number] Date of Birth: November 13, 1951

## 2021-12-07 ENCOUNTER — Other Ambulatory Visit: Payer: Self-pay

## 2021-12-07 ENCOUNTER — Ambulatory Visit (INDEPENDENT_AMBULATORY_CARE_PROVIDER_SITE_OTHER): Payer: Medicare PPO | Admitting: Physical Therapy

## 2021-12-07 DIAGNOSIS — R2689 Other abnormalities of gait and mobility: Secondary | ICD-10-CM

## 2021-12-07 DIAGNOSIS — M6281 Muscle weakness (generalized): Secondary | ICD-10-CM

## 2021-12-07 DIAGNOSIS — M25561 Pain in right knee: Secondary | ICD-10-CM

## 2021-12-07 DIAGNOSIS — G8929 Other chronic pain: Secondary | ICD-10-CM

## 2021-12-07 DIAGNOSIS — M545 Low back pain, unspecified: Secondary | ICD-10-CM | POA: Diagnosis not present

## 2021-12-07 NOTE — Therapy (Signed)
[facility] [address] [place], [place], [address] Phone: [phone]   Fax:  [phone]  Physical Therapy Treatment  Patient Details  Name: [name] MRN: [id number] Date of Birth: 07-15-1951 Referring Provider (PT): Donella Stade, [place]   Encounter Date: 12/07/2021   PT End of Session - 12/07/21 1506     Visit Number 17    Number of Visits 20    Date for PT Re-Evaluation 12/14/21    Authorization Type [employer]    Authorization - Visit Number 5    Authorization - Number of Visits 12    Progress Note Due on Visit 20    PT Start Time 6433    PT Stop Time 1506    PT Time Calculation (min) 41 min    Activity Tolerance Patient tolerated treatment well    Behavior During Therapy [facility] for tasks assessed/performed             Past Medical History:  Diagnosis Date   Anxiety    Atrial fibrillation [facility])    sees Dr. Audie Box   Cancer [facility])    breast   Chronic pain    Concussion 02/2007   ICU x 3 days   Depression    Hypertension    Hypothyroidism    Personal history of chemotherapy    Personal history of radiation therapy    Spinal stenosis    Thyroid disease ?1994    Past Surgical History:  Procedure Laterality Date   BREAST BIOPSY Right 12/20/2019   x2   BREAST LUMPECTOMY Right 01/22/2020   BREAST LUMPECTOMY WITH RADIOACTIVE SEED AND SENTINEL LYMPH NODE BIOPSY Right 01/22/2020   Procedure: RIGHT BREAST LUMPECTOMY WITH RADIOACTIVE SEED X2 AND RIGHT SENTINEL LYMPH NODE MAPPING;  Surgeon: Erroll Luna, MD;  Location: [place];  Service: General;  Laterality: Right;   BREAST SURGERY Right 03/1998   breast biopsy, benign   EYE SURGERY Right    PORT-A-CATH REMOVAL Right 04/30/2021   Procedure: REMOVAL PORT-A-CATH;  Surgeon: Erroll Luna, MD;  Location: [place];  Service: General;  Laterality: Right;   PORTACATH PLACEMENT Right 01/22/2020   Procedure: INSERTION PORT-A-CATH WITH  ULTRASOUND;  Surgeon: Erroll Luna, MD;  Location: [place];  Service: General;  Laterality: Right;   PORTACATH PLACEMENT Right 02/28/2020   Procedure: PORT A CATH REVISION;  Surgeon: Erroll Luna, MD;  Location: [place];  Service: General;  Laterality: Right;   REVERSE SHOULDER ARTHROPLASTY Left 02/02/2021   Procedure: LEFT REVERSE SHOULDER ARTHROPLASTY;  Surgeon: Meredith Pel, MD;  Location: [place];  Service: Orthopedics;  Laterality: Left;   SHOULDER SURGERY Right    SPINAL FUSION  03/04/2011   with ORIF    There were no vitals filed for this visit.   Subjective Assessment - 12/07/21 1505     Subjective relays her back pain feels much better she is still fearful of falling at times    Pertinent History anxiety, breast cancer with lumpectomy and radioactive seed placement 2021 and recent port removal, chronic pain, concussion 2008, HTN, depression, spinal fusion 2012.    Patient Stated Goals improve gait and balance and back pain    Pain Onset More than a month ago    Pain Onset More than a month ago              [facility] Adult PT Treatment/Exercise - 12/07/21 0001       Neuro Re-ed    Neuro Re-ed  Details  in //bars round trips: walking on foam beam fwd and lateral X 3 ea, then cone step overs lateral 2 round trips and fwd 2 round trips. 6 inch step taps X10 no UE support      Lumbar Exercises: Aerobic   Nustep L7 x 15 min      Lumbar Exercises: Machines for Strengthening   Leg Press 118# 3X10, SL 56# X30 bilat      Lumbar Exercises: Standing   Other Standing Lumbar Exercises 6 inch step ups X10 bilat with one UE support                       PT Short Term Goals - 10/21/21 1411       PT SHORT TERM GOAL #1   Title independent with intial HEP    Baseline met 10/19 but continue to progress    Time 4    Period Weeks    Status Achieved    Target Date 10/08/21      PT SHORT TERM GOAL #2   Title improve TUG to less than 17 seconds     Baseline Met 10/19, will make new goal for this    Time 4    Period Weeks    Status Achieved    Target Date 10/08/21      PT SHORT TERM GOAL #3   Title Pt will improve TUG time to less than 11 seconds without AD to improve gait speed and balance.    Baseline 10.45 on 11/2    Time 4    Period Weeks    Status Achieved    Target Date 11/04/21               PT Long Term Goals - 11/16/21 1355       PT LONG TERM GOAL #1   Title She will improve BERG balance score to 46 or more to show improved balance    Baseline scored 51 today 11/2    Time 12    Period Weeks    Status Achieved      PT LONG TERM GOAL #2   Title She will improve TUG score to <10 seconds  to show improved balance and walk speed.    Baseline improved to 10.45 on 11/2    Period Weeks    Status Achieved      PT LONG TERM GOAL #3   Title report back pain < 3/10 with activity for improved function    Baseline depends, can still get up to 5 at times    Time 4    Period Weeks    Status On-going    Target Date 12/14/21      PT LONG TERM GOAL #4   Title Improve bilat knee/hip strength to 5/5 MMT tested in sitting    Baseline now 4+ for hip and 5 for knee    Time 12    Period Weeks    Status On-going    Target Date 12/14/21      PT LONG TERM GOAL #5   Title improve FGA to at least 24/30 for improved mobility and balance    Baseline 19/24    Time 4    Period Weeks    Status New    Target Date 12/14/21      PT LONG TERM GOAL #6   Title -      PT LONG TERM GOAL #7   Title -  Plan - 12/07/21 1511     Clinical Impression Statement Her back pain was doing much better today so we progressed back to her strength and balance program with good overall tolerance and no complaints of increased pain. She is still unsteady on unstable surfaces so we will continue to work to improve this with PT.    Personal Factors and Comorbidities Comorbidity 3+    Comorbidities anxiety, breast  cancer with lumpectomy and radioactive seed placement 2021 and recent port removal, chronic pain, concussion 2008, HTN, depression, spinal fusion 2012    Examination-Activity Limitations Locomotion Level;Reach Overhead;Dressing;Hygiene/Grooming;Lift;Toileting;Bathing    Examination-Participation Restrictions Cleaning;Meal Prep;Community Activity;Driving;Laundry    Stability/Clinical Decision Making Evolving/Moderate complexity    Rehab Potential Fair    PT Frequency 2x / week    PT Duration 4 weeks    PT Treatment/Interventions ADLs/Self Care Home Management;Therapeutic exercise;Patient/family education;Cryotherapy;DME Instruction;Ultrasound;Moist Heat;Functional mobility training;Therapeutic activities;Balance training;Neuromuscular re-education;Manual techniques;Vasopneumatic Device;Taping;Dry needling;Passive range of motion;Electrical Stimulation;Gait training    PT Next Visit Plan continue high level balance work, LE strengthening and manual/modalities PRN    PT Home Exercise Plan Access Code: [email or web address], added tandem balance and SLS    Consulted and Agree with Plan of Care Patient             Patient will benefit from skilled therapeutic intervention in order to improve the following deficits and impairments:  Postural dysfunction, Decreased range of motion, Decreased knowledge of precautions, Increased edema, Impaired UE functional use, Decreased strength, Decreased mobility, Decreased balance, Pain, Difficulty walking, Decreased activity tolerance, Decreased endurance  Visit Diagnosis: Other abnormalities of gait and mobility  Chronic bilateral low back pain without sciatica  Acute pain of right knee  Muscle weakness (generalized)     Problem List Patient Active Problem List   Diagnosis Date Noted   Arthritis of left shoulder region    S/P reverse total shoulder arthroplasty, 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   Vitamin D deficiency 01/15/2021   Vitamin B12 deficiency 01/15/2021   Primary  insomnia 01/15/2021   Morbid obesity ([place]) 01/15/2021   Moderate recurrent major depression ([place]) 01/15/2021   Benzodiazepine dependence ([place]) 01/15/2021   Port-A-Cath in place 07/22/2020   Left leg cellulitis 04/17/2020   Cellulitis of left leg 04/17/2020   Hypokalemia 04/17/2020   Macrocytic anemia 04/17/2020   Anxiety 04/17/2020   Depression 04/17/2020   Chronic diarrhea 04/17/2020   Chemotherapy induced diarrhea 04/17/2020   Malignant neoplasm of upper-inner quadrant of right breast in female, estrogen receptor positive ([place]) 12/26/2019   Encephalopathy 02/02/2018   Hypothyroidism 02/02/2018   AKI (acute kidney injury) ([place]) 02/02/2018   Chronic back pain 02/02/2018   Alcohol abuse 02/02/2018   Weight gain 02/02/2018   Benign essential HTN 02/02/2018    Debbe Odea, PT,DPT 12/07/2021, 3:12 PM  [facility] [address] [place], [place], [address] Phone: [phone]   Fax:  [phone]  Name: BRENN GATTON MRN: [id number] Date of Birth: 17-Jul-1951

## 2021-12-09 ENCOUNTER — Other Ambulatory Visit: Payer: Self-pay

## 2021-12-09 ENCOUNTER — Ambulatory Visit (INDEPENDENT_AMBULATORY_CARE_PROVIDER_SITE_OTHER): Payer: Medicare PPO | Admitting: Physical Therapy

## 2021-12-09 DIAGNOSIS — G8929 Other chronic pain: Secondary | ICD-10-CM | POA: Diagnosis not present

## 2021-12-09 DIAGNOSIS — M25561 Pain in right knee: Secondary | ICD-10-CM | POA: Diagnosis not present

## 2021-12-09 DIAGNOSIS — M545 Low back pain, unspecified: Secondary | ICD-10-CM | POA: Diagnosis not present

## 2021-12-09 DIAGNOSIS — R2689 Other abnormalities of gait and mobility: Secondary | ICD-10-CM | POA: Diagnosis not present

## 2021-12-09 DIAGNOSIS — M6281 Muscle weakness (generalized): Secondary | ICD-10-CM | POA: Diagnosis not present

## 2021-12-09 NOTE — Therapy (Signed)
[facility] [address] [place], [place], [address] Phone: [phone]   Fax:  [phone]  Physical Therapy Treatment  Patient Details  Name: [name] MRN: [id number] Date of Birth: 10-23-1951 Referring Provider (PT): Donella Stade, [place]   Encounter Date: 12/09/2021   PT End of Session - 12/09/21 1336     Visit Number 18    Number of Visits 20    Date for PT Re-Evaluation 12/14/21    Authorization Type [employer]    Authorization - Visit Number 6    Authorization - Number of Visits 12    Progress Note Due on Visit 20    PT Start Time 0100    PT Stop Time 0143    PT Time Calculation (min) 43 min    Activity Tolerance Patient tolerated treatment well    Behavior During Therapy [facility] for tasks assessed/performed             Past Medical History:  Diagnosis Date   Anxiety    Atrial fibrillation [facility])    sees Dr. Audie Box   Cancer [facility])    breast   Chronic pain    Concussion 02/2007   ICU x 3 days   Depression    Hypertension    Hypothyroidism    Personal history of chemotherapy    Personal history of radiation therapy    Spinal stenosis    Thyroid disease ?1994    Past Surgical History:  Procedure Laterality Date   BREAST BIOPSY Right 12/20/2019   x2   BREAST LUMPECTOMY Right 01/22/2020   BREAST LUMPECTOMY WITH RADIOACTIVE SEED AND SENTINEL LYMPH NODE BIOPSY Right 01/22/2020   Procedure: RIGHT BREAST LUMPECTOMY WITH RADIOACTIVE SEED X2 AND RIGHT SENTINEL LYMPH NODE MAPPING;  Surgeon: Erroll Luna, MD;  Location: [place];  Service: General;  Laterality: Right;   BREAST SURGERY Right 03/1998   breast biopsy, benign   EYE SURGERY Right    PORT-A-CATH REMOVAL Right 04/30/2021   Procedure: REMOVAL PORT-A-CATH;  Surgeon: Erroll Luna, MD;  Location: [place];  Service: General;  Laterality: Right;   PORTACATH PLACEMENT Right 01/22/2020   Procedure: INSERTION PORT-A-CATH WITH  ULTRASOUND;  Surgeon: Erroll Luna, MD;  Location: [place];  Service: General;  Laterality: Right;   PORTACATH PLACEMENT Right 02/28/2020   Procedure: PORT A CATH REVISION;  Surgeon: Erroll Luna, MD;  Location: [place];  Service: General;  Laterality: Right;   REVERSE SHOULDER ARTHROPLASTY Left 02/02/2021   Procedure: LEFT REVERSE SHOULDER ARTHROPLASTY;  Surgeon: Meredith Pel, MD;  Location: [place];  Service: Orthopedics;  Laterality: Left;   SHOULDER SURGERY Right    SPINAL FUSION  03/04/2011   with ORIF    There were no vitals filed for this visit.   Subjective Assessment - 12/09/21 1314     Subjective She relays 4/10 overall pain in her back today.    Pertinent History anxiety, breast cancer with lumpectomy and radioactive seed placement 2021 and recent port removal, chronic pain, concussion 2008, HTN, depression, spinal fusion 2012.    Patient Stated Goals improve gait and balance and back pain    Pain Onset More than a month ago    Pain Onset More than a month ago                               [facility] Adult PT Treatment/Exercise - 12/09/21 0001  Ambulation/Gait   Ambulation/Gait Yes    Ambulation/Gait Assistance 5: Supervision    Ambulation Distance (Feet) 150 Feet    Assistive device None      Neuro Re-ed    Neuro Re-ed Details  in //bars round trips: walking on foam beam fwd and lateral X 3 ea, then cone step overs fwd 3 round trips . 6 inch step taps X10 no UE support. walking with head turns and nods 2 round trips ea      Lumbar Exercises: Aerobic   Nustep L7 x 15 min      Lumbar Exercises: Machines for Strengthening   Cybex Knee Extension 15# DL X10    Leg Press 125# 3X10, SL 56# X30 bilat      Lumbar Exercises: Standing   Other Standing Lumbar Exercises 6 inch step ups fwd and lateral X10 bilat with one UE support                       PT Short Term Goals - 10/21/21 1411       PT SHORT TERM  GOAL #1   Title independent with intial HEP    Baseline met 10/19 but continue to progress    Time 4    Period Weeks    Status Achieved    Target Date 10/08/21      PT SHORT TERM GOAL #2   Title improve TUG to less than 17 seconds    Baseline Met 10/19, will make new goal for this    Time 4    Period Weeks    Status Achieved    Target Date 10/08/21      PT SHORT TERM GOAL #3   Title Pt will improve TUG time to less than 11 seconds without AD to improve gait speed and balance.    Baseline 10.45 on 11/2    Time 4    Period Weeks    Status Achieved    Target Date 11/04/21               PT Long Term Goals - 11/16/21 1355       PT LONG TERM GOAL #1   Title She will improve BERG balance score to 46 or more to show improved balance    Baseline scored 51 today 11/2    Time 12    Period Weeks    Status Achieved      PT LONG TERM GOAL #2   Title She will improve TUG score to <10 seconds  to show improved balance and walk speed.    Baseline improved to 10.45 on 11/2    Period Weeks    Status Achieved      PT LONG TERM GOAL #3   Title report back pain < 3/10 with activity for improved function    Baseline depends, can still get up to 5 at times    Time 4    Period Weeks    Status On-going    Target Date 12/14/21      PT LONG TERM GOAL #4   Title Improve bilat knee/hip strength to 5/5 MMT tested in sitting    Baseline now 4+ for hip and 5 for knee    Time 12    Period Weeks    Status On-going    Target Date 12/14/21      PT LONG TERM GOAL #5   Title improve FGA to at least 24/30 for improved mobility and balance  Baseline 19/24    Time 4    Period Weeks    Status New    Target Date 12/14/21      PT LONG TERM GOAL #6   Title -      PT LONG TERM GOAL #7   Title -                   Plan - 12/09/21 1348     Clinical Impression Statement Her performance on balance exercises appeared to be the best I have witnessed so far. I increased the  resistance with her strength program with good overall tolreance to this. She has 2 more visits left on current POC.    Personal Factors and Comorbidities Comorbidity 3+    Comorbidities anxiety, breast cancer with lumpectomy and radioactive seed placement 2021 and recent port removal, chronic pain, concussion 2008, HTN, depression, spinal fusion 2012    Examination-Activity Limitations Locomotion Level;Reach Overhead;Dressing;Hygiene/Grooming;Lift;Toileting;Bathing    Examination-Participation Restrictions Cleaning;Meal Prep;Community Activity;Driving;Laundry    Stability/Clinical Decision Making Evolving/Moderate complexity    Rehab Potential Fair    PT Frequency 2x / week    PT Duration 4 weeks    PT Treatment/Interventions ADLs/Self Care Home Management;Therapeutic exercise;Patient/family education;Cryotherapy;DME Instruction;Ultrasound;Moist Heat;Functional mobility training;Therapeutic activities;Balance training;Neuromuscular re-education;Manual techniques;Vasopneumatic Device;Taping;Dry needling;Passive range of motion;Electrical Stimulation;Gait training    PT Next Visit Plan continue high level balance work, LE strengthening and manual/modalities PRN    PT Home Exercise Plan Access Code: [email or web address], added tandem balance and SLS    Consulted and Agree with Plan of Care Patient             Patient will benefit from skilled therapeutic intervention in order to improve the following deficits and impairments:  Postural dysfunction, Decreased range of motion, Decreased knowledge of precautions, Increased edema, Impaired UE functional use, Decreased strength, Decreased mobility, Decreased balance, Pain, Difficulty walking, Decreased activity tolerance, Decreased endurance  Visit Diagnosis: Other abnormalities of gait and mobility  Chronic bilateral low back pain without sciatica  Acute pain of right knee  Muscle weakness (generalized)     Problem List Patient Active Problem List    Diagnosis Date Noted   Arthritis of left shoulder region    S/P reverse total shoulder arthroplasty, 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   Vitamin D deficiency 01/15/2021   Vitamin B12 deficiency 01/15/2021   Primary insomnia 01/15/2021   Morbid obesity ([place]) 01/15/2021   Moderate recurrent major depression ([place]) 01/15/2021   Benzodiazepine dependence ([place]) 01/15/2021   Port-A-Cath in place 07/22/2020   Left leg cellulitis 04/17/2020   Cellulitis of left leg 04/17/2020   Hypokalemia 04/17/2020   Macrocytic anemia 04/17/2020   Anxiety 04/17/2020   Depression 04/17/2020   Chronic diarrhea 04/17/2020   Chemotherapy induced diarrhea 04/17/2020   Malignant neoplasm of upper-inner quadrant of right breast in female, estrogen receptor positive ([place]) 12/26/2019   Encephalopathy 02/02/2018   Hypothyroidism 02/02/2018   AKI (acute kidney injury) ([place]) 02/02/2018   Chronic back pain 02/02/2018   Alcohol abuse 02/02/2018   Weight gain 02/02/2018   Benign essential HTN 02/02/2018    Debbe Odea, PT,DPT 12/09/2021, 1:50 PM  [facility] Physical Therapy [address] [place], [place], [address] Phone: [phone]   Fax:  [phone]  Name: [name] MRN: [id number] Date of Birth: [birth year]

## 2021-12-15 ENCOUNTER — Ambulatory Visit (INDEPENDENT_AMBULATORY_CARE_PROVIDER_SITE_OTHER): Payer: Medicare PPO | Admitting: Physical Therapy

## 2021-12-15 ENCOUNTER — Other Ambulatory Visit: Payer: Self-pay

## 2021-12-15 DIAGNOSIS — R2689 Other abnormalities of gait and mobility: Secondary | ICD-10-CM | POA: Diagnosis not present

## 2021-12-15 DIAGNOSIS — G8929 Other chronic pain: Secondary | ICD-10-CM

## 2021-12-15 DIAGNOSIS — R262 Difficulty in walking, not elsewhere classified: Secondary | ICD-10-CM

## 2021-12-15 DIAGNOSIS — M25561 Pain in right knee: Secondary | ICD-10-CM

## 2021-12-15 DIAGNOSIS — M6281 Muscle weakness (generalized): Secondary | ICD-10-CM

## 2021-12-15 DIAGNOSIS — M545 Low back pain, unspecified: Secondary | ICD-10-CM | POA: Diagnosis not present

## 2021-12-15 NOTE — Therapy (Signed)
[facility] Physical Therapy [address] [place], [place], [address] Phone: [phone]   Fax:  [phone]  Physical Therapy Treatment/recert/progress note  Progress Note reporting period date 10/21/21 to 12/15/21  See below for objective and subjective measurements relating to patients progress with PT.   Patient Details  Name: [name] MRN: [id number] Date of Birth: [birth year] Referring Provider (PT): Donella Stade, [place]   Encounter Date: 12/15/2021   PT End of Session - 12/15/21 1527     Visit Number 19    Number of Visits 27    Date for PT Re-Evaluation 01/16/22    Authorization Type [employer]    Authorization - Visit Number 7    Authorization - Number of Visits 12    Progress Note Due on Visit 29    PT Start Time 1497    PT Stop Time 1430    PT Time Calculation (min) 45 min    Activity Tolerance Patient tolerated treatment well    Behavior During Therapy WFL for tasks assessed/performed             Past Medical History:  Diagnosis Date   Anxiety    Atrial fibrillation [facility])    sees Dr. Audie Box   Cancer [facility])    breast   Chronic pain    Concussion 02/2007   ICU x 3 days   Depression    Hypertension    Hypothyroidism    Personal history of chemotherapy    Personal history of radiation therapy    Spinal stenosis    Thyroid disease ?1994    Past Surgical History:  Procedure Laterality Date   BREAST BIOPSY Right 12/20/2019   x2   BREAST LUMPECTOMY Right 01/22/2020   BREAST LUMPECTOMY WITH RADIOACTIVE SEED AND SENTINEL LYMPH NODE BIOPSY Right 01/22/2020   Procedure: RIGHT BREAST LUMPECTOMY WITH RADIOACTIVE SEED X2 AND RIGHT SENTINEL LYMPH NODE MAPPING;  Surgeon: Erroll Luna, MD;  Location: [place];  Service: General;  Laterality: Right;   BREAST SURGERY Right 03/1998   breast biopsy, benign   EYE SURGERY Right    PORT-A-CATH REMOVAL Right 04/30/2021   Procedure: REMOVAL PORT-A-CATH;  Surgeon:  Erroll Luna, MD;  Location: [place];  Service: General;  Laterality: Right;   PORTACATH PLACEMENT Right 01/22/2020   Procedure: INSERTION PORT-A-CATH WITH ULTRASOUND;  Surgeon: Erroll Luna, MD;  Location: [place];  Service: General;  Laterality: Right;   PORTACATH PLACEMENT Right 02/28/2020   Procedure: PORT A CATH REVISION;  Surgeon: Erroll Luna, MD;  Location: [place];  Service: General;  Laterality: Right;   REVERSE SHOULDER ARTHROPLASTY Left 02/02/2021   Procedure: LEFT REVERSE SHOULDER ARTHROPLASTY;  Surgeon: Meredith Pel, MD;  Location: [place];  Service: Orthopedics;  Laterality: Left;   SHOULDER SURGERY Right    SPINAL FUSION  03/04/2011   with ORIF    There were no vitals filed for this visit.   Subjective Assessment - 12/15/21 1525     Subjective She relays she is havng 8/10 back pain today and this affects her walking. She feels she needs to continue with PT at least through january.    Pertinent History anxiety, breast cancer with lumpectomy and radioactive seed placement 2021 and recent port removal, chronic pain, concussion 2008, HTN, depression, spinal fusion 2012.    Patient Stated Goals improve gait and balance and back pain    Pain Onset More than a month ago    Pain Onset More than  a month ago                [facility] PT Assessment - 12/15/21 0001       Assessment   Medical Diagnosis LBP, Lt knee pain and weakness from Stress Fx,    Referring Provider (PT) Magnant, Charles L, PA-C      AROM   Lumbar Flexion 100%    Lumbar Extension 50%    Lumbar - Right Side Bend 50%    Lumbar - Left Side Bend 50%    Lumbar - Right Rotation 50%    Lumbar - Left Rotation 50%      Strength   Left Knee Flexion 5/5    Left Knee Extension 5/5                           [facility] Adult PT Treatment/Exercise - 12/15/21 0001       Ambulation/Gait   Ambulation/Gait Yes    Ambulation/Gait Assistance 5:  Supervision    Ambulation Distance (Feet) 150 Feet    Assistive device None    Gait Comments slower velocity today due to pain, wider BOS due to gait unsteadiness bu this has improved some      Lumbar Exercises: Aerobic   Nustep L8 x 15 min      Lumbar Exercises: Machines for Strengthening   Leg Press 125# 3X10, SL 56# X30 bilat      Manual Therapy   Manual therapy comments compression and skilled palpation during DN              Trigger Point Dry Needling - 12/15/21 0001     Consent Given? Yes    Education Handout Provided Previously provided    Muscles Treated Back/Hip Quadratus lumborum;Erector spinae;Lumbar multifidi    Dry Needling Comments good overall tolerance noted, no immediate adverse reaction, Estim today milli amp current with 2 frequency and micro current with 100 frequency intensity to her tolerance                     PT Short Term Goals - 10/21/21 1411       PT SHORT TERM GOAL #1   Title independent with intial HEP    Baseline met 10/19 but continue to progress    Time 4    Period Weeks    Status Achieved    Target Date 10/08/21      PT SHORT TERM GOAL #2   Title improve TUG to less than 17 seconds    Baseline Met 10/19, will make new goal for this    Time 4    Period Weeks    Status Achieved    Target Date 10/08/21      PT SHORT TERM GOAL #3   Title Pt will improve TUG time to less than 11 seconds without AD to improve gait speed and balance.    Baseline 10.45 on 11/2    Time 4    Period Weeks    Status Achieved    Target Date 11/04/21               PT Long Term Goals - 11/16/21 1355       PT LONG TERM GOAL #1   Title She will improve BERG balance score to 46 or more to show improved balance    Baseline scored 51 today 11/2    Time 12    Period Weeks  Status Achieved      PT LONG TERM GOAL #2   Title She will improve TUG score to <10 seconds  to show improved balance and walk speed.    Baseline improved to 10.45  on 11/2    Period Weeks    Status Achieved      PT LONG TERM GOAL #3   Title report back pain < 3/10 with activity for improved function    Baseline depends, can still get up to 5 at times    Time 4    Period Weeks    Status On-going    Target Date 12/14/21      PT LONG TERM GOAL #4   Title Improve bilat knee/hip strength to 5/5 MMT tested in sitting    Baseline now 4+ for hip and 5 for knee    Time 12    Period Weeks    Status On-going    Target Date 12/14/21      PT LONG TERM GOAL #5   Title improve FGA to at least 24/30 for improved mobility and balance    Baseline 19/24    Time 4    Period Weeks    Status New    Target Date 12/14/21      PT LONG TERM GOAL #6   Title -      PT LONG TERM GOAL #7   Title -                   Plan - 12/15/21 1533     Clinical Impression Statement She has made overall progress with her leg strength and back pain. She does still have back pain but this is now only intermittient and DN has helped to manage this. She also does still lack some dynamic gait/balance stability. We will plan to retest her balance scores next visit. Overall I feel skilled PT for another 4 weeks would be medically necessary to reduce her risk of falling and help manage her back pain. Her Lt knee has done very well and she now was good strength and no pain with this.    Personal Factors and Comorbidities Comorbidity 3+    Comorbidities anxiety, breast cancer with lumpectomy and radioactive seed placement 2021 and recent port removal, chronic pain, concussion 2008, HTN, depression, spinal fusion 2012    Examination-Activity Limitations Locomotion Level;Reach Overhead;Dressing;Hygiene/Grooming;Lift;Toileting;Bathing    Examination-Participation Restrictions Cleaning;Meal Prep;Community Activity;Driving;Laundry    Stability/Clinical Decision Making Evolving/Moderate complexity    Rehab Potential Fair    PT Frequency 2x / week    PT Duration 4 weeks    PT  Treatment/Interventions ADLs/Self Care Home Management;Therapeutic exercise;Patient/family education;Cryotherapy;DME Instruction;Ultrasound;Moist Heat;Functional mobility training;Therapeutic activities;Balance training;Neuromuscular re-education;Manual techniques;Vasopneumatic Device;Taping;Dry needling;Passive range of motion;Electrical Stimulation;Gait training    PT Next Visit Plan retest balance exercises    PT Home Exercise Plan Access Code: [email or web address], added tandem balance and SLS    Consulted and Agree with Plan of Care Patient             Patient will benefit from skilled therapeutic intervention in order to improve the following deficits and impairments:  Postural dysfunction, Decreased range of motion, Decreased knowledge of precautions, Increased edema, Impaired UE functional use, Decreased strength, Decreased mobility, Decreased balance, Pain, Difficulty walking, Decreased activity tolerance, Decreased endurance  Visit Diagnosis: Other abnormalities of gait and mobility  Chronic bilateral low back pain without sciatica  Acute pain of right knee  Muscle weakness (generalized)  Difficulty in walking, not elsewhere  classified     Problem List Patient Active Problem List   Diagnosis Date Noted   Arthritis of left shoulder region    S/P reverse total shoulder arthroplasty, 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   Vitamin D deficiency 01/15/2021   Vitamin B12 deficiency 01/15/2021   Primary insomnia 01/15/2021   Morbid obesity ([place]) 01/15/2021   Moderate recurrent major depression ([place]) 01/15/2021   Benzodiazepine dependence ([place]) 01/15/2021   Port-A-Cath in place 07/22/2020   Left leg cellulitis 04/17/2020   Cellulitis of left leg 04/17/2020   Hypokalemia 04/17/2020   Macrocytic anemia 04/17/2020   Anxiety 04/17/2020   Depression 04/17/2020   Chronic diarrhea 04/17/2020   Chemotherapy induced diarrhea 04/17/2020   Malignant neoplasm of upper-inner quadrant of right breast in female,  estrogen receptor positive ([place]) 12/26/2019   Encephalopathy 02/02/2018   Hypothyroidism 02/02/2018   AKI (acute kidney injury) ([place]) 02/02/2018   Chronic back pain 02/02/2018   Alcohol abuse 02/02/2018   Weight gain 02/02/2018   Benign essential HTN 02/02/2018    Debbe Odea, PT,DPT 12/15/2021, 3:59 PM  [facility] Physical Therapy [address] [place], [place], [address] Phone: [phone]   Fax:  [phone]  Name: [name] MRN: [id number] Date of Birth: 03-Jun-1951

## 2021-12-17 ENCOUNTER — Ambulatory Visit (INDEPENDENT_AMBULATORY_CARE_PROVIDER_SITE_OTHER): Payer: Medicare PPO | Admitting: Physical Therapy

## 2021-12-17 ENCOUNTER — Other Ambulatory Visit: Payer: Self-pay

## 2021-12-17 DIAGNOSIS — M545 Low back pain, unspecified: Secondary | ICD-10-CM

## 2021-12-17 DIAGNOSIS — M6281 Muscle weakness (generalized): Secondary | ICD-10-CM

## 2021-12-17 DIAGNOSIS — G8929 Other chronic pain: Secondary | ICD-10-CM | POA: Diagnosis not present

## 2021-12-17 DIAGNOSIS — R2689 Other abnormalities of gait and mobility: Secondary | ICD-10-CM | POA: Diagnosis not present

## 2021-12-17 DIAGNOSIS — R262 Difficulty in walking, not elsewhere classified: Secondary | ICD-10-CM | POA: Diagnosis not present

## 2021-12-17 DIAGNOSIS — M25561 Pain in right knee: Secondary | ICD-10-CM | POA: Diagnosis not present

## 2021-12-17 NOTE — Therapy (Signed)
[facility] Physical Therapy [address] [place], [place], [address] Phone: [phone]   Fax:  [phone]  Physical Therapy Treatment  Patient Details  Name: [name] MRN: [id number] Date of Birth: Feb 11, 1951 Referring Provider (PT): Donella Stade, [place]   Encounter Date: 12/17/2021   PT End of Session - 12/17/21 1415     Visit Number 20    Number of Visits 27    Date for PT Re-Evaluation 01/16/22    Authorization Type [employer]    Authorization - Visit Number 8    Authorization - Number of Visits 12    Progress Note Due on Visit 57    PT Start Time 0459    PT Stop Time 1427    PT Time Calculation (min) 42 min    Activity Tolerance Patient tolerated treatment well    Behavior During Therapy [facility] for tasks assessed/performed             Past Medical History:  Diagnosis Date   Anxiety    Atrial fibrillation [facility])    sees Dr. Audie Box   Cancer [facility])    breast   Chronic pain    Concussion 02/2007   ICU x 3 days   Depression    Hypertension    Hypothyroidism    Personal history of chemotherapy    Personal history of radiation therapy    Spinal stenosis    Thyroid disease ?1994    Past Surgical History:  Procedure Laterality Date   BREAST BIOPSY Right 12/20/2019   x2   BREAST LUMPECTOMY Right 01/22/2020   BREAST LUMPECTOMY WITH RADIOACTIVE SEED AND SENTINEL LYMPH NODE BIOPSY Right 01/22/2020   Procedure: RIGHT BREAST LUMPECTOMY WITH RADIOACTIVE SEED X2 AND RIGHT SENTINEL LYMPH NODE MAPPING;  Surgeon: Erroll Luna, MD;  Location: [place];  Service: General;  Laterality: Right;   BREAST SURGERY Right 03/1998   breast biopsy, benign   EYE SURGERY Right    PORT-A-CATH REMOVAL Right 04/30/2021   Procedure: REMOVAL PORT-A-CATH;  Surgeon: Erroll Luna, MD;  Location: [place];  Service: General;  Laterality: Right;   PORTACATH PLACEMENT Right 01/22/2020   Procedure: INSERTION PORT-A-CATH WITH  ULTRASOUND;  Surgeon: Erroll Luna, MD;  Location: [place];  Service: General;  Laterality: Right;   PORTACATH PLACEMENT Right 02/28/2020   Procedure: PORT A CATH REVISION;  Surgeon: Erroll Luna, MD;  Location: [place];  Service: General;  Laterality: Right;   REVERSE SHOULDER ARTHROPLASTY Left 02/02/2021   Procedure: LEFT REVERSE SHOULDER ARTHROPLASTY;  Surgeon: Meredith Pel, MD;  Location: [place];  Service: Orthopedics;  Laterality: Left;   SHOULDER SURGERY Right    SPINAL FUSION  03/04/2011   with ORIF    There were no vitals filed for this visit.   Subjective Assessment - 12/17/21 1401     Subjective She relays 4/10 overall back pain today, she will make follow up appointment with MD about this    Pertinent History anxiety, breast cancer with lumpectomy and radioactive seed placement 2021 and recent port removal, chronic pain, concussion 2008, HTN, depression, spinal fusion 2012.    Patient Stated Goals improve gait and balance and back pain    Pain Onset More than a month ago    Pain Onset More than a month ago              [facility] Adult PT Treatment/Exercise - 12/17/21 0001       Neuro Re-ed  Neuro Re-ed Details  tandem walking, walking with head turns and nods, retro walking at counter top 3 round trips ea      Lumbar Exercises: Aerobic   Nustep L8 x 15 min      Lumbar Exercises: Machines for Strengthening   Leg Press 125# 3X10, SL 56# X30 bilat      Manual Therapy   Manual therapy comments compression and skilled palpation during DN              Trigger Point Dry Needling - 12/17/21 0001     Consent Given? Yes    Education Handout Provided Previously provided    Muscles Treated Back/Hip Quadratus lumborum;Erector spinae;Lumbar multifidi    Dry Needling Comments good overall tolerance noted, no immediate adverse reaction, Estim today micro current with 100 frequency intensity to her tolerance                     PT  Short Term Goals - 12/17/21 1438       PT SHORT TERM GOAL #1   Title independent with intial HEP    Baseline met 10/19 but continue to progress    Time 4    Period Weeks    Status Achieved    Target Date 10/08/21      PT SHORT TERM GOAL #2   Title improve TUG to less than 17 seconds    Baseline Met 10/19, will make new goal for this    Time 4    Period Weeks    Status Achieved    Target Date 10/08/21      PT SHORT TERM GOAL #3   Title Pt will improve TUG time to less than 11 seconds without AD to improve gait speed and balance.    Baseline 10.45 on 11/2    Time 4    Period Weeks    Status Achieved    Target Date 11/04/21               PT Long Term Goals - 11/16/21 1355       PT LONG TERM GOAL #1   Title She will improve BERG balance score to 46 or more to show improved balance    Baseline scored 51 today 11/2    Time 12    Period Weeks    Status Achieved      PT LONG TERM GOAL #2   Title She will improve TUG score to <10 seconds  to show improved balance and walk speed.    Baseline improved to 10.45 on 11/2    Period Weeks    Status Achieved      PT LONG TERM GOAL #3   Title report back pain < 3/10 with activity for improved function    Baseline depends, can still get up to 5 at times    Time 4    Period Weeks    Status On-going    Target Date 12/14/21      PT LONG TERM GOAL #4   Title Improve bilat knee/hip strength to 5/5 MMT tested in sitting    Baseline now 4+ for hip and 5 for knee    Time 12    Period Weeks    Status On-going    Target Date 12/14/21      PT LONG TERM GOAL #5   Title improve FGA to at least 24/30 for improved mobility and balance    Baseline 19/24    Time 4  Period Weeks    Status New    Target Date 12/14/21      PT LONG TERM GOAL #6   Title -      PT LONG TERM GOAL #7   Title -                   Plan - 12/17/21 1415     Clinical Impression Statement She as again limited by back pain some this session  and will follow up with MD about this. We did perform DN again in combo with Estim as this does give her some back pain relief. PT recommending to continue POC.    Personal Factors and Comorbidities Comorbidity 3+    Comorbidities anxiety, breast cancer with lumpectomy and radioactive seed placement 2021 and recent port removal, chronic pain, concussion 2008, HTN, depression, spinal fusion 2012    Examination-Activity Limitations Locomotion Level;Reach Overhead;Dressing;Hygiene/Grooming;Lift;Toileting;Bathing    Examination-Participation Restrictions Cleaning;Meal Prep;Community Activity;Driving;Laundry    Stability/Clinical Decision Making Evolving/Moderate complexity    Rehab Potential Fair    PT Frequency 2x / week    PT Duration 4 weeks    PT Treatment/Interventions ADLs/Self Care Home Management;Therapeutic exercise;Patient/family education;Cryotherapy;DME Instruction;Ultrasound;Moist Heat;Functional mobility training;Therapeutic activities;Balance training;Neuromuscular re-education;Manual techniques;Vasopneumatic Device;Taping;Dry needling;Passive range of motion;Electrical Stimulation;Gait training    PT Next Visit Plan retest balance exercises    PT Home Exercise Plan Access Code: [email or web address], added tandem balance and SLS    Consulted and Agree with Plan of Care Patient             Patient will benefit from skilled therapeutic intervention in order to improve the following deficits and impairments:  Postural dysfunction, Decreased range of motion, Decreased knowledge of precautions, Increased edema, Impaired UE functional use, Decreased strength, Decreased mobility, Decreased balance, Pain, Difficulty walking, Decreased activity tolerance, Decreased endurance  Visit Diagnosis: Other abnormalities of gait and mobility  Chronic bilateral low back pain without sciatica  Acute pain of right knee  Muscle weakness (generalized)  Difficulty in walking, not elsewhere  classified     Problem List Patient Active Problem List   Diagnosis Date Noted   Arthritis of left shoulder region    S/P reverse total shoulder arthroplasty, 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   Vitamin D deficiency 01/15/2021   Vitamin B12 deficiency 01/15/2021   Primary insomnia 01/15/2021   Morbid obesity ([place]) 01/15/2021   Moderate recurrent major depression ([place]) 01/15/2021   Benzodiazepine dependence ([place]) 01/15/2021   Port-A-Cath in place 07/22/2020   Left leg cellulitis 04/17/2020   Cellulitis of left leg 04/17/2020   Hypokalemia 04/17/2020   Macrocytic anemia 04/17/2020   Anxiety 04/17/2020   Depression 04/17/2020   Chronic diarrhea 04/17/2020   Chemotherapy induced diarrhea 04/17/2020   Malignant neoplasm of upper-inner quadrant of right breast in female, estrogen receptor positive ([place]) 12/26/2019   Encephalopathy 02/02/2018   Hypothyroidism 02/02/2018   AKI (acute kidney injury) ([place]) 02/02/2018   Chronic back pain 02/02/2018   Alcohol abuse 02/02/2018   Weight gain 02/02/2018   Benign essential HTN 02/02/2018    Debbe Odea, PT,DPT 12/17/2021, 2:38 PM  [facility] Physical Therapy [address] [place], [place], [address] Phone: [phone]   Fax:  [phone]  Name: [name] MRN: [id number] Date of Birth: 12-22-1950

## 2021-12-21 ENCOUNTER — Telehealth: Payer: Self-pay

## 2021-12-21 NOTE — Telephone Encounter (Signed)
Spoke with patient regarding scheduling a follow up. Patient is doing well and declined further services. Will discharge.

## 2021-12-24 ENCOUNTER — Ambulatory Visit (INDEPENDENT_AMBULATORY_CARE_PROVIDER_SITE_OTHER): Payer: Medicare PPO | Admitting: Physical Therapy

## 2021-12-24 ENCOUNTER — Other Ambulatory Visit: Payer: Self-pay

## 2021-12-24 DIAGNOSIS — R262 Difficulty in walking, not elsewhere classified: Secondary | ICD-10-CM

## 2021-12-24 DIAGNOSIS — M25561 Pain in right knee: Secondary | ICD-10-CM | POA: Diagnosis not present

## 2021-12-24 DIAGNOSIS — M545 Low back pain, unspecified: Secondary | ICD-10-CM | POA: Diagnosis not present

## 2021-12-24 DIAGNOSIS — R2689 Other abnormalities of gait and mobility: Secondary | ICD-10-CM | POA: Diagnosis not present

## 2021-12-24 DIAGNOSIS — G8929 Other chronic pain: Secondary | ICD-10-CM | POA: Diagnosis not present

## 2021-12-24 DIAGNOSIS — M6281 Muscle weakness (generalized): Secondary | ICD-10-CM | POA: Diagnosis not present

## 2021-12-24 NOTE — Therapy (Signed)
[facility] Physical Therapy [address] [place], [place], [address] Phone: [phone]   Fax:  [phone]  Physical Therapy Treatment  Patient Details  Name: [name] MRN: [id number] Date of Birth: 11/02/1951 Referring Provider (PT): Donella Stade, [place]   Encounter Date: 12/24/2021   PT End of Session - 12/24/21 1411     Visit Number 21    Number of Visits 27    Date for PT Re-Evaluation 01/16/22    Authorization Type [employer]    Authorization Time Period 11/16/21 to 01/16/22    Authorization - Visit Number 9    Authorization - Number of Visits 12    Progress Note Due on Visit 29    PT Start Time 1346    PT Stop Time 1430    PT Time Calculation (min) 44 min    Activity Tolerance Patient tolerated treatment well    Behavior During Therapy [facility] for tasks assessed/performed             Past Medical History:  Diagnosis Date   Anxiety    Atrial fibrillation [facility])    sees Dr. Audie Box   Cancer [facility])    breast   Chronic pain    Concussion 02/2007   ICU x 3 days   Depression    Hypertension    Hypothyroidism    Personal history of chemotherapy    Personal history of radiation therapy    Spinal stenosis    Thyroid disease ?1994    Past Surgical History:  Procedure Laterality Date   BREAST BIOPSY Right 12/20/2019   x2   BREAST LUMPECTOMY Right 01/22/2020   BREAST LUMPECTOMY WITH RADIOACTIVE SEED AND SENTINEL LYMPH NODE BIOPSY Right 01/22/2020   Procedure: RIGHT BREAST LUMPECTOMY WITH RADIOACTIVE SEED X2 AND RIGHT SENTINEL LYMPH NODE MAPPING;  Surgeon: Erroll Luna, MD;  Location: [place];  Service: General;  Laterality: Right;   BREAST SURGERY Right 03/1998   breast biopsy, benign   EYE SURGERY Right    PORT-A-CATH REMOVAL Right 04/30/2021   Procedure: REMOVAL PORT-A-CATH;  Surgeon: Erroll Luna, MD;  Location: [place];  Service: General;  Laterality: Right;   PORTACATH PLACEMENT Right  01/22/2020   Procedure: INSERTION PORT-A-CATH WITH ULTRASOUND;  Surgeon: Erroll Luna, MD;  Location: [place];  Service: General;  Laterality: Right;   PORTACATH PLACEMENT Right 02/28/2020   Procedure: PORT A CATH REVISION;  Surgeon: Erroll Luna, MD;  Location: [place];  Service: General;  Laterality: Right;   REVERSE SHOULDER ARTHROPLASTY Left 02/02/2021   Procedure: LEFT REVERSE SHOULDER ARTHROPLASTY;  Surgeon: Meredith Pel, MD;  Location: [place];  Service: Orthopedics;  Laterality: Left;   SHOULDER SURGERY Right    SPINAL FUSION  03/04/2011   with ORIF    There were no vitals filed for this visit.   Subjective Assessment - 12/24/21 1408     Subjective relays the back pain is overall doing better since her last PT visit.    Pertinent History anxiety, breast cancer with lumpectomy and radioactive seed placement 2021 and recent port removal, chronic pain, concussion 2008, HTN, depression, spinal fusion 2012.    Patient Stated Goals improve gait and balance and back pain    Pain Onset More than a month ago    Pain Onset More than a month ago             [facility] Adult PT Treatment/Exercise - 12/24/21 0001       Ambulation/Gait  Ambulation/Gait Yes    Ambulation/Gait Assistance 5: Supervision    Ambulation Distance (Feet) 175 Feet    Assistive device None    Gait Comments slower velocity  wider BOS due to gait unsteadiness bu this has improved some      Neuro Re-ed    Neuro Re-ed Details  fwd walking and lateral walking on foam beam 4 round trips, stepping over cones      Lumbar Exercises: Aerobic   Nustep L8 x 15 min for functional endurance and leg strength      Lumbar Exercises: Machines for Strengthening   Cybex Knee Extension 10# DL 2X15    Leg Press 125# 3X12, SL 75# 3X10 bilat                       PT Short Term Goals - 12/17/21 1438       PT SHORT TERM GOAL #1   Title independent with intial HEP    Baseline met 10/19  but continue to progress    Time 4    Period Weeks    Status Achieved    Target Date 10/08/21      PT SHORT TERM GOAL #2   Title improve TUG to less than 17 seconds    Baseline Met 10/19, will make new goal for this    Time 4    Period Weeks    Status Achieved    Target Date 10/08/21      PT SHORT TERM GOAL #3   Title Pt will improve TUG time to less than 11 seconds without AD to improve gait speed and balance.    Baseline 10.45 on 11/2    Time 4    Period Weeks    Status Achieved    Target Date 11/04/21               PT Long Term Goals - 11/16/21 1355       PT LONG TERM GOAL #1   Title She will improve BERG balance score to 46 or more to show improved balance    Baseline scored 51 today 11/2    Time 12    Period Weeks    Status Achieved      PT LONG TERM GOAL #2   Title She will improve TUG score to <10 seconds  to show improved balance and walk speed.    Baseline improved to 10.45 on 11/2    Period Weeks    Status Achieved      PT LONG TERM GOAL #3   Title report back pain < 3/10 with activity for improved function    Baseline depends, can still get up to 5 at times    Time 4    Period Weeks    Status On-going    Target Date 12/14/21      PT LONG TERM GOAL #4   Title Improve bilat knee/hip strength to 5/5 MMT tested in sitting    Baseline now 4+ for hip and 5 for knee    Time 12    Period Weeks    Status On-going    Target Date 12/14/21      PT LONG TERM GOAL #5   Title improve FGA to at least 24/30 for improved mobility and balance    Baseline 19/24    Time 4    Period Weeks    Status New    Target Date 12/14/21      PT  LONG TERM GOAL #6   Title -      PT LONG TERM GOAL #7   Title -                   Plan - 12/24/21 1412     Clinical Impression Statement her back pain had improved some so we held off on DN and instead perfomed additional strengthening and balance exercises. Strength is progressing well but balance has  progressed very slowly to this point.    Personal Factors and Comorbidities Comorbidity 3+    Comorbidities anxiety, breast cancer with lumpectomy and radioactive seed placement 2021 and recent port removal, chronic pain, concussion 2008, HTN, depression, spinal fusion 2012    Examination-Activity Limitations Locomotion Level;Reach Overhead;Dressing;Hygiene/Grooming;Lift;Toileting;Bathing    Examination-Participation Restrictions Cleaning;Meal Prep;Community Activity;Driving;Laundry    Stability/Clinical Decision Making Evolving/Moderate complexity    Rehab Potential Fair    PT Frequency 2x / week    PT Duration 4 weeks    PT Treatment/Interventions ADLs/Self Care Home Management;Therapeutic exercise;Patient/family education;Cryotherapy;DME Instruction;Ultrasound;Moist Heat;Functional mobility training;Therapeutic activities;Balance training;Neuromuscular re-education;Manual techniques;Vasopneumatic Device;Taping;Dry needling;Passive range of motion;Electrical Stimulation;Gait training    PT Next Visit Plan balance, leg strength, gait    PT Home Exercise Plan Access Code: [email or web address], added tandem balance and SLS    Consulted and Agree with Plan of Care Patient             Patient will benefit from skilled therapeutic intervention in order to improve the following deficits and impairments:  Postural dysfunction, Decreased range of motion, Decreased knowledge of precautions, Increased edema, Impaired UE functional use, Decreased strength, Decreased mobility, Decreased balance, Pain, Difficulty walking, Decreased activity tolerance, Decreased endurance  Visit Diagnosis: Other abnormalities of gait and mobility  Chronic bilateral low back pain without sciatica  Acute pain of right knee  Muscle weakness (generalized)  Difficulty in walking, not elsewhere classified     Problem List Patient Active Problem List   Diagnosis Date Noted   Arthritis of left shoulder region    S/P reverse  total shoulder arthroplasty, 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   Vitamin D deficiency 01/15/2021   Vitamin B12 deficiency 01/15/2021   Primary insomnia 01/15/2021   Morbid obesity ([place]) 01/15/2021   Moderate recurrent major depression ([place]) 01/15/2021   Benzodiazepine dependence ([place]) 01/15/2021   Port-A-Cath in place 07/22/2020   Left leg cellulitis 04/17/2020   Cellulitis of left leg 04/17/2020   Hypokalemia 04/17/2020   Macrocytic anemia 04/17/2020   Anxiety 04/17/2020   Depression 04/17/2020   Chronic diarrhea 04/17/2020   Chemotherapy induced diarrhea 04/17/2020   Malignant neoplasm of upper-inner quadrant of right breast in female, estrogen receptor positive ([place]) 12/26/2019   Encephalopathy 02/02/2018   Hypothyroidism 02/02/2018   AKI (acute kidney injury) ([place]) 02/02/2018   Chronic back pain 02/02/2018   Alcohol abuse 02/02/2018   Weight gain 02/02/2018   Benign essential HTN 02/02/2018    Debbe Odea, PT,DPT 12/24/2021, 2:32 PM  [facility] Physical Therapy [address] [place], [place], [address] Phone: [phone]   Fax:  [phone]  Name: LIBBIE BARTLEY MRN: [id number] Date of Birth: 1951/09/02

## 2021-12-28 ENCOUNTER — Other Ambulatory Visit: Payer: Self-pay

## 2021-12-28 ENCOUNTER — Ambulatory Visit (INDEPENDENT_AMBULATORY_CARE_PROVIDER_SITE_OTHER): Payer: Medicare PPO | Admitting: Physical Therapy

## 2021-12-28 DIAGNOSIS — G8929 Other chronic pain: Secondary | ICD-10-CM | POA: Diagnosis not present

## 2021-12-28 DIAGNOSIS — R2689 Other abnormalities of gait and mobility: Secondary | ICD-10-CM

## 2021-12-28 DIAGNOSIS — M6281 Muscle weakness (generalized): Secondary | ICD-10-CM | POA: Diagnosis not present

## 2021-12-28 DIAGNOSIS — R262 Difficulty in walking, not elsewhere classified: Secondary | ICD-10-CM

## 2021-12-28 DIAGNOSIS — M545 Low back pain, unspecified: Secondary | ICD-10-CM

## 2021-12-28 DIAGNOSIS — M25561 Pain in right knee: Secondary | ICD-10-CM | POA: Diagnosis not present

## 2021-12-28 NOTE — Therapy (Signed)
[facility] Physical Therapy [address] [place], [place], [address] Phone: [phone]   Fax:  [phone]  Physical Therapy Treatment  Patient Details  Name: [name] MRN: [id number] Date of Birth: [birth year] Referring Provider (PT): Donella Stade, [place]   Encounter Date: 12/28/2021   PT End of Session - 12/28/21 1423     Visit Number 22    Number of Visits 27    Date for PT Re-Evaluation 01/16/22    Authorization Type [employer]    Authorization Time Period 11/16/21 to 01/16/22    Authorization - Visit Number 10    Authorization - Number of Visits 12    Progress Note Due on Visit 29    PT Start Time 1346    PT Stop Time 1430    PT Time Calculation (min) 44 min    Activity Tolerance Patient tolerated treatment well    Behavior During Therapy [facility] for tasks assessed/performed             Past Medical History:  Diagnosis Date   Anxiety    Atrial fibrillation [facility])    sees Dr. Audie Box   Cancer [facility])    breast   Chronic pain    Concussion 02/2007   ICU x 3 days   Depression    Hypertension    Hypothyroidism    Personal history of chemotherapy    Personal history of radiation therapy    Spinal stenosis    Thyroid disease ?1994    Past Surgical History:  Procedure Laterality Date   BREAST BIOPSY Right 12/20/2019   x2   BREAST LUMPECTOMY Right 01/22/2020   BREAST LUMPECTOMY WITH RADIOACTIVE SEED AND SENTINEL LYMPH NODE BIOPSY Right 01/22/2020   Procedure: RIGHT BREAST LUMPECTOMY WITH RADIOACTIVE SEED X2 AND RIGHT SENTINEL LYMPH NODE MAPPING;  Surgeon: Erroll Luna, MD;  Location: [place];  Service: General;  Laterality: Right;   BREAST SURGERY Right 03/1998   breast biopsy, benign   EYE SURGERY Right    PORT-A-CATH REMOVAL Right 04/30/2021   Procedure: REMOVAL PORT-A-CATH;  Surgeon: Erroll Luna, MD;  Location: [place];  Service: General;  Laterality: Right;   PORTACATH PLACEMENT Right  01/22/2020   Procedure: INSERTION PORT-A-CATH WITH ULTRASOUND;  Surgeon: Erroll Luna, MD;  Location: [place];  Service: General;  Laterality: Right;   PORTACATH PLACEMENT Right 02/28/2020   Procedure: PORT A CATH REVISION;  Surgeon: Erroll Luna, MD;  Location: [place];  Service: General;  Laterality: Right;   REVERSE SHOULDER ARTHROPLASTY Left 02/02/2021   Procedure: LEFT REVERSE SHOULDER ARTHROPLASTY;  Surgeon: Meredith Pel, MD;  Location: [place];  Service: Orthopedics;  Laterality: Left;   SHOULDER SURGERY Right    SPINAL FUSION  03/04/2011   with ORIF    There were no vitals filed for this visit.   Subjective Assessment - 12/28/21 1417     Subjective back pain is a little worse today but not bad. Her balance is her biggest complaint, her knee is doing well she reports    Pertinent History anxiety, breast cancer with lumpectomy and radioactive seed placement 2021 and recent port removal, chronic pain, concussion 2008, HTN, depression, spinal fusion 2012.    Patient Stated Goals improve gait and balance and back pain    Pain Onset More than a month ago    Pain Onset More than a month ago  [place] Adult PT Treatment/Exercise - 12/28/21 0001       Ambulation/Gait   Ambulation/Gait Yes    Ambulation/Gait Assistance 5: Supervision    Ambulation Distance (Feet) 175 Feet    Assistive device None    Gait Comments slower velocity  wider BOS due to gait unsteadiness bu this has improved some      Neuro Re-ed    Neuro Re-ed Details  fwd walking and lateral walking on foam beam 3 round trips, 3 cone SLS taps X 5 bilat      Lumbar Exercises: Aerobic   Nustep L8 x 15 min for functional endurance and leg strength      Lumbar Exercises: Machines for Strengthening   Cybex Knee Extension 10# DL 2X15    Leg Press 125# 3X12, SL 75# 3X10 bilat      Lumbar Exercises: Standing   Other Standing Lumbar Exercises step  ups 6 inch step X10 bilat in bars with intermitt UE support PRN                       PT Short Term Goals - 12/17/21 1438       PT SHORT TERM GOAL #1   Title independent with intial HEP    Baseline met 10/19 but continue to progress    Time 4    Period Weeks    Status Achieved    Target Date 10/08/21      PT SHORT TERM GOAL #2   Title improve TUG to less than 17 seconds    Baseline Met 10/19, will make new goal for this    Time 4    Period Weeks    Status Achieved    Target Date 10/08/21      PT SHORT TERM GOAL #3   Title Pt will improve TUG time to less than 11 seconds without AD to improve gait speed and balance.    Baseline 10.45 on 11/2    Time 4    Period Weeks    Status Achieved    Target Date 11/04/21               PT Long Term Goals - 11/16/21 1355       PT LONG TERM GOAL #1   Title She will improve BERG balance score to 46 or more to show improved balance    Baseline scored 51 today 11/2    Time 12    Period Weeks    Status Achieved      PT LONG TERM GOAL #2   Title She will improve TUG score to <10 seconds  to show improved balance and walk speed.    Baseline improved to 10.45 on 11/2    Period Weeks    Status Achieved      PT LONG TERM GOAL #3   Title report back pain < 3/10 with activity for improved function    Baseline depends, can still get up to 5 at times    Time 4    Period Weeks    Status On-going    Target Date 12/14/21      PT LONG TERM GOAL #4   Title Improve bilat knee/hip strength to 5/5 MMT tested in sitting    Baseline now 4+ for hip and 5 for knee    Time 12    Period Weeks    Status On-going    Target Date 12/14/21      PT LONG TERM  GOAL #5   Title improve FGA to at least 24/30 for improved mobility and balance    Baseline 19/24    Time 4    Period Weeks    Status New    Target Date 12/14/21      PT LONG TERM GOAL #6   Title -      PT LONG TERM GOAL #7   Title -                    Plan - 12/28/21 1425     Clinical Impression Statement her gait is overall improving as leg strength improves but balance still remains a challenge for her. We will continue to work to improve this to reduce her risk of falling.    Personal Factors and Comorbidities Comorbidity 3+    Comorbidities anxiety, breast cancer with lumpectomy and radioactive seed placement 2021 and recent port removal, chronic pain, concussion 2008, HTN, depression, spinal fusion 2012    Examination-Activity Limitations Locomotion Level;Reach Overhead;Dressing;Hygiene/Grooming;Lift;Toileting;Bathing    Examination-Participation Restrictions Cleaning;Meal Prep;Community Activity;Driving;Laundry    Stability/Clinical Decision Making Evolving/Moderate complexity    Rehab Potential Fair    PT Frequency 2x / week    PT Duration 4 weeks    PT Treatment/Interventions ADLs/Self Care Home Management;Therapeutic exercise;Patient/family education;Cryotherapy;DME Instruction;Ultrasound;Moist Heat;Functional mobility training;Therapeutic activities;Balance training;Neuromuscular re-education;Manual techniques;Vasopneumatic Device;Taping;Dry needling;Passive range of motion;Electrical Stimulation;Gait training    PT Next Visit Plan balance, leg strength, gait    PT Home Exercise Plan Access Code: [email or web address], added tandem balance and SLS    Consulted and Agree with Plan of Care Patient             Patient will benefit from skilled therapeutic intervention in order to improve the following deficits and impairments:  Postural dysfunction, Decreased range of motion, Decreased knowledge of precautions, Increased edema, Impaired UE functional use, Decreased strength, Decreased mobility, Decreased balance, Pain, Difficulty walking, Decreased activity tolerance, Decreased endurance  Visit Diagnosis: Other abnormalities of gait and mobility  Chronic bilateral low back pain without sciatica  Acute pain of right knee  Muscle weakness  (generalized)  Difficulty in walking, not elsewhere classified     Problem List Patient Active Problem List   Diagnosis Date Noted   Arthritis of left shoulder region    S/P reverse total shoulder arthroplasty, 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   Vitamin D deficiency 01/15/2021   Vitamin B12 deficiency 01/15/2021   Primary insomnia 01/15/2021   Morbid obesity ([place]) 01/15/2021   Moderate recurrent major depression ([place]) 01/15/2021   Benzodiazepine dependence ([place]) 01/15/2021   Port-A-Cath in place 07/22/2020   Left leg cellulitis 04/17/2020   Cellulitis of left leg 04/17/2020   Hypokalemia 04/17/2020   Macrocytic anemia 04/17/2020   Anxiety 04/17/2020   Depression 04/17/2020   Chronic diarrhea 04/17/2020   Chemotherapy induced diarrhea 04/17/2020   Malignant neoplasm of upper-inner quadrant of right breast in female, estrogen receptor positive ([place]) 12/26/2019   Encephalopathy 02/02/2018   Hypothyroidism 02/02/2018   AKI (acute kidney injury) ([place]) 02/02/2018   Chronic back pain 02/02/2018   Alcohol abuse 02/02/2018   Weight gain 02/02/2018   Benign essential HTN 02/02/2018    Debbe Odea, PT,DPT 12/28/2021, 2:26 PM  [facility] Physical Therapy [address] [place], [place], [address] Phone: [phone]   Fax:  [phone]  Name: VERGIE ZAHM MRN: [id number] Date of Birth: [birth year]

## 2021-12-30 ENCOUNTER — Ambulatory Visit (INDEPENDENT_AMBULATORY_CARE_PROVIDER_SITE_OTHER): Payer: Medicare PPO | Admitting: Physical Therapy

## 2021-12-30 ENCOUNTER — Other Ambulatory Visit: Payer: Self-pay

## 2021-12-30 ENCOUNTER — Encounter: Payer: Self-pay | Admitting: Physical Therapy

## 2021-12-30 DIAGNOSIS — M6281 Muscle weakness (generalized): Secondary | ICD-10-CM | POA: Diagnosis not present

## 2021-12-30 DIAGNOSIS — R262 Difficulty in walking, not elsewhere classified: Secondary | ICD-10-CM | POA: Diagnosis not present

## 2021-12-30 DIAGNOSIS — M545 Low back pain, unspecified: Secondary | ICD-10-CM | POA: Diagnosis not present

## 2021-12-30 DIAGNOSIS — M25561 Pain in right knee: Secondary | ICD-10-CM | POA: Diagnosis not present

## 2021-12-30 DIAGNOSIS — G8929 Other chronic pain: Secondary | ICD-10-CM | POA: Diagnosis not present

## 2021-12-30 DIAGNOSIS — R2689 Other abnormalities of gait and mobility: Secondary | ICD-10-CM | POA: Diagnosis not present

## 2021-12-30 NOTE — Therapy (Signed)
[facility] [address] [place], [place], [address] Phone: [phone]   Fax:  [phone]  Physical Therapy Treatment  Patient Details  Name: [name] MRN: [id number] Date of Birth: 1951-09-01 Referring Provider (PT): Donella Stade, [place]   Encounter Date: 12/30/2021   PT End of Session - 12/30/21 1524     Visit Number 23    Number of Visits 27    Date for PT Re-Evaluation 01/16/22    Authorization Type Hum[employer]    Authorization Time Period 11/16/21 to 01/16/22    Authorization - Visit Number 11    Authorization - Number of Visits 12    Progress Note Due on Visit 29    PT Start Time 9407    PT Stop Time 1600    PT Time Calculation (min) 44 min    Activity Tolerance Patient tolerated treatment well    Behavior During Therapy [facility] for tasks assessed/performed             Past Medical History:  Diagnosis Date   Anxiety    Atrial fibrillation [facility])    sees Dr. Audie Box   Cancer [facility])    breast   Chronic pain    Concussion 02/2007   ICU x 3 days   Depression    Hypertension    Hypothyroidism    Personal history of chemotherapy    Personal history of radiation therapy    Spinal stenosis    Thyroid disease ?1994    Past Surgical History:  Procedure Laterality Date   BREAST BIOPSY Right 12/20/2019   x2   BREAST LUMPECTOMY Right 01/22/2020   BREAST LUMPECTOMY WITH RADIOACTIVE SEED AND SENTINEL LYMPH NODE BIOPSY Right 01/22/2020   Procedure: RIGHT BREAST LUMPECTOMY WITH RADIOACTIVE SEED X2 AND RIGHT SENTINEL LYMPH NODE MAPPING;  Surgeon: Erroll Luna, MD;  Location: [place];  Service: General;  Laterality: Right;   BREAST SURGERY Right 03/1998   breast biopsy, benign   EYE SURGERY Right    PORT-A-CATH REMOVAL Right 04/30/2021   Procedure: REMOVAL PORT-A-CATH;  Surgeon: Erroll Luna, MD;  Location: [place];  Service: General;  Laterality: Right;   PORTACATH PLACEMENT Right  01/22/2020   Procedure: INSERTION PORT-A-CATH WITH ULTRASOUND;  Surgeon: Erroll Luna, MD;  Location: [place];  Service: General;  Laterality: Right;   PORTACATH PLACEMENT Right 02/28/2020   Procedure: PORT A CATH REVISION;  Surgeon: Erroll Luna, MD;  Location: [place];  Service: General;  Laterality: Right;   REVERSE SHOULDER ARTHROPLASTY Left 02/02/2021   Procedure: LEFT REVERSE SHOULDER ARTHROPLASTY;  Surgeon: Meredith Pel, MD;  Location: [place];  Service: Orthopedics;  Laterality: Left;   SHOULDER SURGERY Right    SPINAL FUSION  03/04/2011   with ORIF    There were no vitals filed for this visit.   Subjective Assessment - 12/30/21 1523     Subjective relays 2/10 overall back pain today, denies pain her knee. She still feels off balance    Pertinent History anxiety, breast cancer with lumpectomy and radioactive seed placement 2021 and recent port removal, chronic pain, concussion 2008, HTN, depression, spinal fusion 2012.    Patient Stated Goals improve gait and balance and back pain    Pain Onset More than a month ago    Pain Onset More than a month ago              [facility] Adult PT Treatment/Exercise - 12/30/21 0001  Neuro Re-ed    Neuro Re-ed Details  tandem walk and retro walk 3 round trips, 3 cone SLS taps X 5 bilat      Lumbar Exercises: Aerobic   Nustep L8 x 15 min for functional endurance and leg strength      Lumbar Exercises: Machines for Strengthening   Cybex Knee Extension 15# 3X15    Leg Press 125# 3X12, SL 75# 3X10 bilat                       PT Short Term Goals - 12/17/21 1438       PT SHORT TERM GOAL #1   Title independent with intial HEP    Baseline met 10/19 but continue to progress    Time 4    Period Weeks    Status Achieved    Target Date 10/08/21      PT SHORT TERM GOAL #2   Title improve TUG to less than 17 seconds    Baseline Met 10/19, will make new goal for this    Time 4    Period Weeks     Status Achieved    Target Date 10/08/21      PT SHORT TERM GOAL #3   Title Pt will improve TUG time to less than 11 seconds without AD to improve gait speed and balance.    Baseline 10.45 on 11/2    Time 4    Period Weeks    Status Achieved    Target Date 11/04/21               PT Long Term Goals - 11/16/21 1355       PT LONG TERM GOAL #1   Title She will improve BERG balance score to 46 or more to show improved balance    Baseline scored 51 today 11/2    Time 12    Period Weeks    Status Achieved      PT LONG TERM GOAL #2   Title She will improve TUG score to <10 seconds  to show improved balance and walk speed.    Baseline improved to 10.45 on 11/2    Period Weeks    Status Achieved      PT LONG TERM GOAL #3   Title report back pain < 3/10 with activity for improved function    Baseline depends, can still get up to 5 at times    Time 4    Period Weeks    Status On-going    Target Date 12/14/21      PT LONG TERM GOAL #4   Title Improve bilat knee/hip strength to 5/5 MMT tested in sitting    Baseline now 4+ for hip and 5 for knee    Time 12    Period Weeks    Status On-going    Target Date 12/14/21      PT LONG TERM GOAL #5   Title improve FGA to at least 24/30 for improved mobility and balance    Baseline 19/24    Time 4    Period Weeks    Status New    Target Date 12/14/21      PT LONG TERM GOAL #6   Title -      PT LONG TERM GOAL #7   Title -                   Plan - 12/30/21 1620  Clinical Impression Statement She was able to perform more resistance on leg extension machine which demonstrates improvements in overall leg strength. I felt her balance was better with SLS cone taps but she still has difficuly balancing with tandem walk. We will update measurments and goals next session for recert.    Personal Factors and Comorbidities Comorbidity 3+    Comorbidities anxiety, breast cancer with lumpectomy and radioactive seed  placement 2021 and recent port removal, chronic pain, concussion 2008, HTN, depression, spinal fusion 2012    Examination-Activity Limitations Locomotion Level;Reach Overhead;Dressing;Hygiene/Grooming;Lift;Toileting;Bathing    Examination-Participation Restrictions Cleaning;Meal Prep;Community Activity;Driving;Laundry    Stability/Clinical Decision Making Evolving/Moderate complexity    Rehab Potential Fair    PT Frequency 2x / week    PT Duration 4 weeks    PT Treatment/Interventions ADLs/Self Care Home Management;Therapeutic exercise;Patient/family education;Cryotherapy;DME Instruction;Ultrasound;Moist Heat;Functional mobility training;Therapeutic activities;Balance training;Neuromuscular re-education;Manual techniques;Vasopneumatic Device;Taping;Dry needling;Passive range of motion;Electrical Stimulation;Gait training    PT Next Visit Plan humana reauth progress note    PT Home Exercise Plan Access Code: [email or web address], added tandem balance and SLS    Consulted and Agree with Plan of Care Patient             Patient will benefit from skilled therapeutic intervention in order to improve the following deficits and impairments:  Postural dysfunction, Decreased range of motion, Decreased knowledge of precautions, Increased edema, Impaired UE functional use, Decreased strength, Decreased mobility, Decreased balance, Pain, Difficulty walking, Decreased activity tolerance, Decreased endurance  Visit Diagnosis: Other abnormalities of gait and mobility  Chronic bilateral low back pain without sciatica  Acute pain of right knee  Muscle weakness (generalized)  Difficulty in walking, not elsewhere classified     Problem List Patient Active Problem List   Diagnosis Date Noted   Arthritis of left shoulder region    S/P reverse total shoulder arthroplasty, 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   Vitamin D deficiency 01/15/2021   Vitamin B12 deficiency 01/15/2021   Primary insomnia 01/15/2021   Morbid obesity ([place])  01/15/2021   Moderate recurrent major depression ([place]) 01/15/2021   Benzodiazepine dependence ([place]) 01/15/2021   Port-A-Cath in place 07/22/2020   Left leg cellulitis 04/17/2020   Cellulitis of left leg 04/17/2020   Hypokalemia 04/17/2020   Macrocytic anemia 04/17/2020   Anxiety 04/17/2020   Depression 04/17/2020   Chronic diarrhea 04/17/2020   Chemotherapy induced diarrhea 04/17/2020   Malignant neoplasm of upper-inner quadrant of right breast in female, estrogen receptor positive ([place]) 12/26/2019   Encephalopathy 02/02/2018   Hypothyroidism 02/02/2018   AKI (acute kidney injury) ([place]) 02/02/2018   Chronic back pain 02/02/2018   Alcohol abuse 02/02/2018   Weight gain 02/02/2018   Benign essential HTN 02/02/2018    Debbe Odea, PT,DPT 12/30/2021, 4:23 PM  [facility] [address] [place], [place], [address] Phone: [phone]   Fax:  [phone]  Name: [name] MRN: [id number] Date of Birth: [birth year]

## 2022-01-04 ENCOUNTER — Encounter: Payer: Self-pay | Admitting: Physical Therapy

## 2022-01-04 ENCOUNTER — Other Ambulatory Visit: Payer: Self-pay

## 2022-01-04 ENCOUNTER — Ambulatory Visit (INDEPENDENT_AMBULATORY_CARE_PROVIDER_SITE_OTHER): Payer: Medicare PPO | Admitting: Physical Therapy

## 2022-01-04 DIAGNOSIS — R2689 Other abnormalities of gait and mobility: Secondary | ICD-10-CM | POA: Diagnosis not present

## 2022-01-04 DIAGNOSIS — M6281 Muscle weakness (generalized): Secondary | ICD-10-CM | POA: Diagnosis not present

## 2022-01-04 DIAGNOSIS — R262 Difficulty in walking, not elsewhere classified: Secondary | ICD-10-CM | POA: Diagnosis not present

## 2022-01-04 DIAGNOSIS — G8929 Other chronic pain: Secondary | ICD-10-CM | POA: Diagnosis not present

## 2022-01-04 DIAGNOSIS — M25561 Pain in right knee: Secondary | ICD-10-CM | POA: Diagnosis not present

## 2022-01-04 DIAGNOSIS — M545 Low back pain, unspecified: Secondary | ICD-10-CM

## 2022-01-04 NOTE — Therapy (Signed)
[facility] [address] [place], [place], [address] Phone: [phone]   Fax:  [phone]  Physical Therapy Treatment/[employer] reauthorization Referring diagnosis? M54.50 Treatment diagnosis? (if different than referring diagnosis) R26.89 What was this (referring dx) caused by? '[]'  Surgery '[x]'  Fall '[x]'  Ongoing issue '[]'  Arthritis '[]'  Other: ____________  Laterality: '[]'  Rt '[]'  Lt '[x]'  Both  Check all possible CPT codes:  *CHOOSE 10 OR LESS*    '[]'  97110 (Therapeutic Exercise)  '[]'  92507 (SLP Treatment)  '[]'  97112 (Neuro Re-ed)   '[]'  92526 (Swallowing Treatment)   '[]'  97116 (Gait Training)   '[]'  [id number] (Cognitive Training, 1st 15 minutes) '[]'  97140 (Manual Therapy)   '[]'  97130 (Cognitive Training, each add'l 15 minutes)  '[]'  97530 (Therapeutic Activities)  '[]'  Other, List CPT Code ____________    '[]'  25427 (Self Care)       '[x]'  All codes above (97110 - 97535)  '[]'  97012 (Mechanical Traction)  '[x]'  97014 (E-stim Unattended)  '[]'  97032 (E-stim manual)  '[]'  97033 (Ionto)  '[x]'  97035 (Ultrasound)  '[]'  97760 (Orthotic Fit) '[]'  97750 (Physical Performance Training) '[]'  [id number] (Aquatic Therapy) '[]'  97034 (Contrast Bath) '[]'  [id number] (Paraffin) '[]'  97597 (Wound Care 1st 20 sq cm) '[]'  97598 (Wound Care each add'l 20 sq cm) '[]'  97016 (Vasopneumatic Device) '[]'  [id number] (Orthotic Training) '[]'  [id number] (Prosthetic Training)    Patient Details  Name: [name] MRN: [id number] Date of Birth: 01/19/1951 Referring Provider (PT): Donella Stade, [place]   Encounter Date: 01/04/2022   PT End of Session - 01/04/22 1415     Visit Number 24    Number of Visits 27    Date for PT Re-Evaluation 01/16/22    Authorization Type [employer]    Authorization Time Period 11/16/21 to 01/16/22    Authorization - Visit Number 12    Authorization - Number of Visits 12   did new hamana auth 01/04/22   Progress Note Due on Visit 29    PT Start Time 1320    PT Stop Time 1335    PT Time  Calculation (min) 15 min    Activity Tolerance Patient limited by pain    Behavior During Therapy --   tearful due to pain            Past Medical History:  Diagnosis Date   Anxiety    Atrial fibrillation [facility])    sees Dr. Audie Box   Cancer [facility])    breast   Chronic pain    Concussion 02/2007   ICU x 3 days   Depression    Hypertension    Hypothyroidism    Personal history of chemotherapy    Personal history of radiation therapy    Spinal stenosis    Thyroid disease ?1994    Past Surgical History:  Procedure Laterality Date   BREAST BIOPSY Right 12/20/2019   x2   BREAST LUMPECTOMY Right 01/22/2020   BREAST LUMPECTOMY WITH RADIOACTIVE SEED AND SENTINEL LYMPH NODE BIOPSY Right 01/22/2020   Procedure: RIGHT BREAST LUMPECTOMY WITH RADIOACTIVE SEED X2 AND RIGHT SENTINEL LYMPH NODE MAPPING;  Surgeon: Erroll Luna, MD;  Location: [place];  Service: General;  Laterality: Right;   BREAST SURGERY Right 03/1998   breast biopsy, benign   EYE SURGERY Right    PORT-A-CATH REMOVAL Right 04/30/2021   Procedure: REMOVAL PORT-A-CATH;  Surgeon: Erroll Luna, MD;  Location: [place];  Service: General;  Laterality: Right;   PORTACATH PLACEMENT Right 01/22/2020   Procedure: INSERTION  PORT-A-CATH WITH ULTRASOUND;  Surgeon: Erroll Luna, MD;  Location: [place];  Service: General;  Laterality: Right;   PORTACATH PLACEMENT Right 02/28/2020   Procedure: PORT A CATH REVISION;  Surgeon: Erroll Luna, MD;  Location: [place];  Service: General;  Laterality: Right;   REVERSE SHOULDER ARTHROPLASTY Left 02/02/2021   Procedure: LEFT REVERSE SHOULDER ARTHROPLASTY;  Surgeon: Meredith Pel, MD;  Location: [place];  Service: Orthopedics;  Laterality: Left;   SHOULDER SURGERY Right    SPINAL FUSION  03/04/2011   with ORIF    There were no vitals filed for this visit.   Subjective Assessment - 01/04/22 1412     Subjective She relays  severe back pain today 9/10. This started yesterday without specifc mechanism of injury but she does state she had her grandkids this weekend so may be from too much activity with them.    Pertinent History anxiety, breast cancer with lumpectomy and radioactive seed placement 2021 and recent port removal, chronic pain, concussion 2008, HTN, depression, spinal fusion 2012.    Patient Stated Goals improve gait and balance and back pain    Pain Onset More than a month ago    Pain Onset More than a month ago                [facility] PT Assessment - 01/04/22 0001       Assessment   Medical Diagnosis LBP, Lt knee pain and weakness from Stress Fx,    Referring Provider (PT) Magnant, Charles L, PA-C      AROM   Lumbar Flexion 100%    Lumbar Extension 50%    Lumbar - Right Side Bend 50%    Lumbar - Left Side Bend 50%    Lumbar - Right Rotation 50%    Lumbar - Left Rotation 50%      Strength   Right Knee Flexion 5/5    Right Knee Extension 5/5    Left Knee Flexion 5/5    Left Knee Extension 5/5      Ambulation/Gait   Ambulation/Gait Yes    Ambulation/Gait Assistance 5: Supervision    Ambulation/Gait Assistance Details limited by back pain today    Ambulation Distance (Feet) 75 Feet   X2   Assistive device None    Gait Comments slower velocity  wider BOS due to gait unsteadiness bu this has improved some                           [facility] Adult PT Treatment/Exercise - 01/04/22 0001       Manual Therapy   Manual therapy comments compression and skilled palpation during DN              Trigger Point Dry Needling - 01/04/22 0001     Consent Given? Yes    Education Handout Provided Previously provided    Muscles Treated Back/Hip Quadratus lumborum;Erector spinae;Lumbar multifidi    Dry Needling Comments good overall tolerance noted, no immediate adverse reaction, Estim today micro current with 100 frequency intensity to her tolerance                      PT Short Term Goals - 12/17/21 1438       PT SHORT TERM GOAL #1   Title independent with intial HEP    Baseline met 10/19 but continue to progress    Time 4    Period Weeks  Status Achieved    Target Date 10/08/21      PT SHORT TERM GOAL #2   Title improve TUG to less than 17 seconds    Baseline Met 10/19, will make new goal for this    Time 4    Period Weeks    Status Achieved    Target Date 10/08/21      PT SHORT TERM GOAL #3   Title Pt will improve TUG time to less than 11 seconds without AD to improve gait speed and balance.    Baseline 10.45 on 11/2    Time 4    Period Weeks    Status Achieved    Target Date 11/04/21               PT Long Term Goals - 11/16/21 1355       PT LONG TERM GOAL #1   Title She will improve BERG balance score to 46 or more to show improved balance    Baseline scored 51 today 11/2    Time 12    Period Weeks    Status Achieved      PT LONG TERM GOAL #2   Title She will improve TUG score to <10 seconds  to show improved balance and walk speed.    Baseline improved to 10.45 on 11/2    Period Weeks    Status Achieved      PT LONG TERM GOAL #3   Title report back pain < 3/10 with activity for improved function    Baseline depends, can still get up to 5 at times    Time 4    Period Weeks    Status On-going    Target Date 12/14/21      PT LONG TERM GOAL #4   Title Improve bilat knee/hip strength to 5/5 MMT tested in sitting    Baseline now 4+ for hip and 5 for knee    Time 12    Period Weeks    Status On-going    Target Date 12/14/21      PT LONG TERM GOAL #5   Title improve FGA to at least 24/30 for improved mobility and balance    Baseline 19/24    Time 4    Period Weeks    Status New    Target Date 12/14/21      PT LONG TERM GOAL #6   Title -      PT LONG TERM GOAL #7   Title -                   Plan - 01/04/22 1417     Clinical Impression Statement Short session today as she  was limited by pain. We did perform DN today with manual therapy to reduce her back pain. After this I planned to reassess her measurments, goals, and balance tests for new humana reauthorization however she became tearful and states she was unalbe to perform any testing or continue session due to pain. I will send humana reauthorization using her past ROM and strength measurments and we will more accurately measure her balance and assess goals next visit when able to do so. I do feel continued skilled PT is medically necessary due to her unstable gait and balance placing her at an increased risk of falling. Her knee pain and strength has improved greatly and now her only complaints are back pain and balance.    Personal Factors and Comorbidities Comorbidity 3+  Comorbidities anxiety, breast cancer with lumpectomy and radioactive seed placement 2021 and recent port removal, chronic pain, concussion 2008, HTN, depression, spinal fusion 2012    Examination-Activity Limitations Locomotion Level;Reach Overhead;Dressing;Hygiene/Grooming;Lift;Toileting;Bathing    Examination-Participation Restrictions Cleaning;Meal Prep;Community Activity;Driving;Laundry    Stability/Clinical Decision Making Evolving/Moderate complexity    Rehab Potential Fair    PT Frequency 2x / week    PT Duration 4 weeks    PT Treatment/Interventions ADLs/Self Care Home Management;Therapeutic exercise;Patient/family education;Cryotherapy;DME Instruction;Ultrasound;Moist Heat;Functional mobility training;Therapeutic activities;Balance training;Neuromuscular re-education;Manual techniques;Vasopneumatic Device;Taping;Dry needling;Passive range of motion;Electrical Stimulation;Gait training    PT Next Visit Plan check for new [place] [facility] notes    PT Home Exercise Plan Access Code: [email or web address], added tandem balance and SLS    Consulted and Agree with Plan of Care Patient             Patient will benefit from skilled therapeutic  intervention in order to improve the following deficits and impairments:  Postural dysfunction, Decreased range of motion, Decreased knowledge of precautions, Increased edema, Impaired UE functional use, Decreased strength, Decreased mobility, Decreased balance, Pain, Difficulty walking, Decreased activity tolerance, Decreased endurance  Visit Diagnosis: Other abnormalities of gait and mobility  Chronic bilateral low back pain without sciatica  Acute pain of right knee  Muscle weakness (generalized)  Difficulty in walking, not elsewhere classified     Problem List Patient Active Problem List   Diagnosis Date Noted   Arthritis of left shoulder region    S/P reverse total shoulder arthroplasty, 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   Vitamin D deficiency 01/15/2021   Vitamin B12 deficiency 01/15/2021   Primary insomnia 01/15/2021   Morbid obesity ([place]) 01/15/2021   Moderate recurrent major depression ([place]) 01/15/2021   Benzodiazepine dependence ([place]) 01/15/2021   Port-A-Cath in place 07/22/2020   Left leg cellulitis 04/17/2020   Cellulitis of left leg 04/17/2020   Hypokalemia 04/17/2020   Macrocytic anemia 04/17/2020   Anxiety 04/17/2020   Depression 04/17/2020   Chronic diarrhea 04/17/2020   Chemotherapy induced diarrhea 04/17/2020   Malignant neoplasm of upper-inner quadrant of right breast in female, estrogen receptor positive ([place]) 12/26/2019   Encephalopathy 02/02/2018   Hypothyroidism 02/02/2018   AKI (acute kidney injury) ([place]) 02/02/2018   Chronic back pain 02/02/2018   Alcohol abuse 02/02/2018   Weight gain 02/02/2018   Benign essential HTN 02/02/2018    Debbe Odea, PT,DPT 01/04/2022, 2:21 PM  [facility] [address] [place], [place], [address] Phone: [phone]   Fax:  [phone]  Name: [name] MRN: [id number] Date of Birth: Feb 13, 1951

## 2022-01-06 ENCOUNTER — Ambulatory Visit (INDEPENDENT_AMBULATORY_CARE_PROVIDER_SITE_OTHER): Payer: Medicare PPO | Admitting: Physical Therapy

## 2022-01-06 ENCOUNTER — Other Ambulatory Visit: Payer: Self-pay

## 2022-01-06 ENCOUNTER — Ambulatory Visit (INDEPENDENT_AMBULATORY_CARE_PROVIDER_SITE_OTHER): Payer: Medicare PPO | Admitting: Orthopedic Surgery

## 2022-01-06 DIAGNOSIS — G8929 Other chronic pain: Secondary | ICD-10-CM | POA: Diagnosis not present

## 2022-01-06 DIAGNOSIS — M6281 Muscle weakness (generalized): Secondary | ICD-10-CM

## 2022-01-06 DIAGNOSIS — M545 Low back pain, unspecified: Secondary | ICD-10-CM | POA: Diagnosis not present

## 2022-01-06 DIAGNOSIS — R262 Difficulty in walking, not elsewhere classified: Secondary | ICD-10-CM | POA: Diagnosis not present

## 2022-01-06 DIAGNOSIS — R2689 Other abnormalities of gait and mobility: Secondary | ICD-10-CM | POA: Diagnosis not present

## 2022-01-06 DIAGNOSIS — M25561 Pain in right knee: Secondary | ICD-10-CM | POA: Diagnosis not present

## 2022-01-06 MED ORDER — TRAMADOL HCL 50 MG PO TABS: 50.0000 mg | ORAL_TABLET | Freq: Two times a day (BID) | ORAL | 2 refills | Status: DC | PRN

## 2022-01-06 NOTE — Therapy (Signed)
[facility] Physical Therapy [address] [place], [place], [address] Phone: [phone]   Fax:  [phone]  Physical Therapy Treatment  Patient Details  Name: [name] MRN: [id number] Date of Birth: Apr 21, 1951 Referring Provider (PT): Donella Stade, [place]   Encounter Date: 01/06/2022   PT End of Session - 01/06/22 1414     Visit Number 25    Number of Visits 27    Date for PT Re-Evaluation 01/16/22    Authorization Type [employer]    Authorization Time Period from 01/04/22 to 03/04/22    Authorization - Visit Number 1    Authorization - Number of Visits 12   did new hamana auth 01/04/22   Progress Note Due on Visit 29    PT Start Time 1607    PT Stop Time 1430    PT Time Calculation (min) 45 min    Activity Tolerance Patient limited by pain;Patient tolerated treatment well    Behavior During Therapy [facility] for tasks assessed/performed             Past Medical History:  Diagnosis Date   Anxiety    Atrial fibrillation [facility])    sees Dr. Audie Box   Cancer [facility])    breast   Chronic pain    Concussion 02/2007   ICU x 3 days   Depression    Hypertension    Hypothyroidism    Personal history of chemotherapy    Personal history of radiation therapy    Spinal stenosis    Thyroid disease ?1994    Past Surgical History:  Procedure Laterality Date   BREAST BIOPSY Right 12/20/2019   x2   BREAST LUMPECTOMY Right 01/22/2020   BREAST LUMPECTOMY WITH RADIOACTIVE SEED AND SENTINEL LYMPH NODE BIOPSY Right 01/22/2020   Procedure: RIGHT BREAST LUMPECTOMY WITH RADIOACTIVE SEED X2 AND RIGHT SENTINEL LYMPH NODE MAPPING;  Surgeon: Erroll Luna, MD;  Location: [place];  Service: General;  Laterality: Right;   BREAST SURGERY Right 03/1998   breast biopsy, benign   EYE SURGERY Right    PORT-A-CATH REMOVAL Right 04/30/2021   Procedure: REMOVAL PORT-A-CATH;  Surgeon: Erroll Luna, MD;  Location: [place];  Service:  General;  Laterality: Right;   PORTACATH PLACEMENT Right 01/22/2020   Procedure: INSERTION PORT-A-CATH WITH ULTRASOUND;  Surgeon: Erroll Luna, MD;  Location: [place];  Service: General;  Laterality: Right;   PORTACATH PLACEMENT Right 02/28/2020   Procedure: PORT A CATH REVISION;  Surgeon: Erroll Luna, MD;  Location: [place];  Service: General;  Laterality: Right;   REVERSE SHOULDER ARTHROPLASTY Left 02/02/2021   Procedure: LEFT REVERSE SHOULDER ARTHROPLASTY;  Surgeon: Meredith Pel, MD;  Location: [place];  Service: Orthopedics;  Laterality: Left;   SHOULDER SURGERY Right    SPINAL FUSION  03/04/2011   with ORIF    There were no vitals filed for this visit.   Subjective Assessment - 01/06/22 1414     Subjective she relays her back feels better today, does not feel like it needs any DN.    Pertinent History anxiety, breast cancer with lumpectomy and radioactive seed placement 2021 and recent port removal, chronic pain, concussion 2008, HTN, depression, spinal fusion 2012.    Patient Stated Goals improve gait and balance and back pain    Pain Onset More than a month ago                [facility] PT Assessment - 01/06/22 0001  Assessment   Medical Diagnosis LBP, Lt knee pain and weakness from Stress Fx,    Referring Provider (PT) Magnant, Gerrianne Scale, PA-C      Functional Gait  Assessment   Gait Level Surface Walks 20 ft in less than 7 sec but greater than 5.5 sec, uses assistive device, slower speed, mild gait deviations, or deviates 6-10 in outside of the 12 in walkway width.    Change in Gait Speed Able to smoothly change walking speed without loss of balance or gait deviation. Deviate no more than 6 in outside of the 12 in walkway width.    Gait with Horizontal Head Turns Performs head turns smoothly with slight change in gait velocity (eg, minor disruption to smooth gait path), deviates 6-10 in outside 12 in walkway width, or uses an assistive device.     Gait with Vertical Head Turns Performs task with slight change in gait velocity (eg, minor disruption to smooth gait path), deviates 6 - 10 in outside 12 in walkway width or uses assistive device    Gait and Pivot Turn Pivot turns safely within 3 sec and stops quickly with no loss of balance.    Step Over Obstacle Is able to step over one shoe box (4.5 in total height) without changing gait speed. No evidence of imbalance.    Gait with Narrow Base of Support Ambulates less than 4 steps heel to toe or cannot perform without assistance.    Gait with Eyes Closed Walks 20 ft, slow speed, abnormal gait pattern, evidence for imbalance, deviates 10-15 in outside 12 in walkway width. Requires more than 9 sec to ambulate 20 ft.    Ambulating Backwards Walks 20 ft, uses assistive device, slower speed, mild gait deviations, deviates 6-10 in outside 12 in walkway width.    Steps Alternating feet, must use rail.    Total Score 19                           [facility] Adult PT Treatment/Exercise - 01/06/22 0001       Ambulation/Gait   Ambulation/Gait Yes    Ambulation/Gait Assistance 5: Supervision    Ambulation Distance (Feet) 200 Feet    Assistive device None    Stairs Yes    Stairs Assistance 5: Supervision    Stair Management Technique Alternating pattern;One rail Right    Number of Stairs 12    Height of Stairs 6      Lumbar Exercises: Aerobic   Nustep L8 x 15 min for functional endurance and leg strength      Lumbar Exercises: Machines for Strengthening   Leg Press 125# 2X15, SL 75# 3X10 bilat                       PT Short Term Goals - 12/17/21 1438       PT SHORT TERM GOAL #1   Title independent with intial HEP    Baseline met 10/19 but continue to progress    Time 4    Period Weeks    Status Achieved    Target Date 10/08/21      PT SHORT TERM GOAL #2   Title improve TUG to less than 17 seconds    Baseline Met 10/19, will make new goal for this     Time 4    Period Weeks    Status Achieved    Target Date 10/08/21  PT SHORT TERM GOAL #3   Title Pt will improve TUG time to less than 11 seconds without AD to improve gait speed and balance.    Baseline 10.45 on 11/2    Time 4    Period Weeks    Status Achieved    Target Date 11/04/21               PT Long Term Goals - 11/16/21 1355       PT LONG TERM GOAL #1   Title She will improve BERG balance score to 46 or more to show improved balance    Baseline scored 51 today 11/2    Time 12    Period Weeks    Status Achieved      PT LONG TERM GOAL #2   Title She will improve TUG score to <10 seconds  to show improved balance and walk speed.    Baseline improved to 10.45 on 11/2    Period Weeks    Status Achieved      PT LONG TERM GOAL #3   Title report back pain < 3/10 with activity for improved function    Baseline depends, can still get up to 5 at times    Time 4    Period Weeks    Status On-going    Target Date 12/14/21      PT LONG TERM GOAL #4   Title Improve bilat knee/hip strength to 5/5 MMT tested in sitting    Baseline now 4+ for hip and 5 for knee    Time 12    Period Weeks    Status On-going    Target Date 12/14/21      PT LONG TERM GOAL #5   Title improve FGA to at least 24/30 for improved mobility and balance    Baseline 19/24    Time 4    Period Weeks    Status New    Target Date 12/14/21      PT LONG TERM GOAL #6   Title -      PT LONG TERM GOAL #7   Title -                   Plan - 01/06/22 1418     Clinical Impression Statement Balance testing today still indicate she is at a moderate falls risk. Balance and gait progressions have been overall limited due to her continued low back pain. She will follow up with MD about this today after her PT session. PT recommending to continue PT to work on balance, gait, strength and endurance. I am pleased with her leg strength bilat now.    Personal Factors and Comorbidities  Comorbidity 3+    Comorbidities anxiety, breast cancer with lumpectomy and radioactive seed placement 2021 and recent port removal, chronic pain, concussion 2008, HTN, depression, spinal fusion 2012    Examination-Activity Limitations Locomotion Level;Reach Overhead;Dressing;Hygiene/Grooming;Lift;Toileting;Bathing    Examination-Participation Restrictions Cleaning;Meal Prep;Community Activity;Driving;Laundry    Stability/Clinical Decision Making Evolving/Moderate complexity    Rehab Potential Fair    PT Frequency 2x / week    PT Duration 4 weeks    PT Treatment/Interventions ADLs/Self Care Home Management;Therapeutic exercise;Patient/family education;Cryotherapy;DME Instruction;Ultrasound;Moist Heat;Functional mobility training;Therapeutic activities;Balance training;Neuromuscular re-education;Manual techniques;Vasopneumatic Device;Taping;Dry needling;Passive range of motion;Electrical Stimulation;Gait training    PT Next Visit Plan what did MD say?    PT Home Exercise Plan Access Code: [email or web address], added tandem balance and SLS    Consulted and Agree with Plan of Care  Patient             Patient will benefit from skilled therapeutic intervention in order to improve the following deficits and impairments:  Postural dysfunction, Decreased range of motion, Decreased knowledge of precautions, Increased edema, Impaired UE functional use, Decreased strength, Decreased mobility, Decreased balance, Pain, Difficulty walking, Decreased activity tolerance, Decreased endurance  Visit Diagnosis: Other abnormalities of gait and mobility  Chronic bilateral low back pain without sciatica  Acute pain of right knee  Muscle weakness (generalized)  Difficulty in walking, not elsewhere classified     Problem List Patient Active Problem List   Diagnosis Date Noted   Arthritis of left shoulder region    S/P reverse total shoulder arthroplasty, 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   Vitamin D deficiency 01/15/2021    Vitamin B12 deficiency 01/15/2021   Primary insomnia 01/15/2021   Morbid obesity ([place]) 01/15/2021   Moderate recurrent major depression ([place]) 01/15/2021   Benzodiazepine dependence ([place]) 01/15/2021   Port-A-Cath in place 07/22/2020   Left leg cellulitis 04/17/2020   Cellulitis of left leg 04/17/2020   Hypokalemia 04/17/2020   Macrocytic anemia 04/17/2020   Anxiety 04/17/2020   Depression 04/17/2020   Chronic diarrhea 04/17/2020   Chemotherapy induced diarrhea 04/17/2020   Malignant neoplasm of upper-inner quadrant of right breast in female, estrogen receptor positive ([place]) 12/26/2019   Encephalopathy 02/02/2018   Hypothyroidism 02/02/2018   AKI (acute kidney injury) ([place]) 02/02/2018   Chronic back pain 02/02/2018   Alcohol abuse 02/02/2018   Weight gain 02/02/2018   Benign essential HTN 02/02/2018    Debbe Odea, PT,DPT 01/06/2022, 2:29 PM  [facility] Physical Therapy [address] [place], [place], [address] Phone: [phone]   Fax:  [phone]  Name: [name] MRN: [id number] Date of Birth: [birth year]

## 2022-01-10 ENCOUNTER — Encounter: Payer: Self-pay | Admitting: Orthopedic Surgery

## 2022-01-10 NOTE — Progress Notes (Signed)
Office Visit Note   Patient: [name]           Date of Birth: 04-Jan-1951           MRN: [id number] Visit Date: 01/06/2022 Requested by: Isabel Small, MD [place],  [place] [address] PCP: Isabel Small, MD  Subjective: Chief Complaint  Patient presents with   Lower Back - Pain    HPI: [name] is a 71 year old patient with left-sided low back pain which is on and off.  Usually controlled with ibuprofen but can take it due to being on blood thinners.  Tylenol does not help too much.  She does not want any surgery.  Prior lumbar surgery L4-5 and cyst by Dr. Arnoldo [name].  In general the patient is walking a little better and the strength is better but her balance is off.  Her pain is manageable.  She is on Eliquis.  She also has left leg swelling and she takes Lasix for that.  She is going to physical therapy upstairs.  Her shoulder is doing reasonably well.              ROS: All systems reviewed are negative as they relate to the chief complaint within the history of present illness.  Patient denies  fevers or chills.   Assessment & Plan: Visit Diagnoses:  1. Low back pain, unspecified back pain laterality, unspecified chronicity, unspecified whether sciatica present     Plan: Impression is relatively stable low back pain.  Patient does not want really any type of surgery.  Injections are difficult with her being on Eliquis.  Ultram has helped her in the past.  That is refilled today.  We will see how she does with that and follow-up as needed  Follow-Up Instructions: Return if symptoms worsen or fail to improve.   Orders:  No orders of the defined types were placed in this encounter.  Meds ordered this encounter  Medications   traMADol (ULTRAM) 50 MG tablet    Sig: Take 1 tablet (50 mg total) by mouth every 12 (twelve) hours as needed.    Dispense:  60 tablet    Refill:  2      Procedures: No procedures performed   Clinical Data: No additional  findings.  Objective: Vital Signs: LMP  (LMP Unknown)   Physical Exam:   Constitutional: Patient appears well-developed HEENT:  Head: Normocephalic Eyes:EOM are normal Neck: Normal range of motion Cardiovascular: Normal rate Pulmonary/chest: Effort normal Neurologic: Patient is alert Skin: Skin is warm Psychiatric: Patient has normal mood and affect   Ortho Exam: Ortho exam demonstrates pretty normal gait alignment.  She does have some swelling in that left leg but negative Homans no calf tenderness.  Ankle dorsiflexion plantarflexion quad hamstring strength intact with no groin pain with internal ex rotation of the leg.  She has good forward flexion and AB duction in the shoulder.  Mild pain with forward lateral bending.  Hip flexion strength 5+ out of 5 bilateral.  Specialty Comments:  No specialty comments available.  Imaging: No results found.   PMFS History: Patient Active Problem List   Diagnosis Date Noted   Arthritis of left shoulder region    S/P reverse total shoulder arthroplasty, 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   Vitamin D deficiency 01/15/2021   Vitamin B12 deficiency 01/15/2021   Primary insomnia 01/15/2021   Morbid obesity ([place]) 01/15/2021   Moderate recurrent major depression ([place]) 01/15/2021   Benzodiazepine dependence ([place]) 01/15/2021  Port-A-Cath in place 07/22/2020   Left leg cellulitis 04/17/2020   Cellulitis of left leg 04/17/2020   Hypokalemia 04/17/2020   Macrocytic anemia 04/17/2020   Anxiety 04/17/2020   Depression 04/17/2020   Chronic diarrhea 04/17/2020   Chemotherapy induced diarrhea 04/17/2020   Malignant neoplasm of upper-inner quadrant of right breast in female, estrogen receptor positive ([place]) 12/26/2019   Encephalopathy 02/02/2018   Hypothyroidism 02/02/2018   AKI (acute kidney injury) ([place]) 02/02/2018   Chronic back pain 02/02/2018   Alcohol abuse 02/02/2018   Weight gain 02/02/2018   Benign essential HTN 02/02/2018   Past Medical History:   Diagnosis Date   Anxiety    Atrial fibrillation [facility])    sees [name]   Cancer [facility])    breast   Chronic pain    Concussion 02/2007   ICU x 3 days   Depression    Hypertension    Hypothyroidism    Personal history of chemotherapy    Personal history of radiation therapy    Spinal stenosis    Thyroid disease ?1994    Family History  Problem Relation Age of Onset   Thyroid disease Mother    Dementia Mother    Diabetes Father    Stroke Father    Hypertension Sister    Diabetes Sister     Past Surgical History:  Procedure Laterality Date   BREAST BIOPSY Right 12/20/2019   x2   BREAST LUMPECTOMY Right 01/22/2020   BREAST LUMPECTOMY WITH RADIOACTIVE SEED AND SENTINEL LYMPH NODE BIOPSY Right 01/22/2020   Procedure: RIGHT BREAST LUMPECTOMY WITH RADIOACTIVE SEED X2 AND RIGHT SENTINEL LYMPH NODE MAPPING;  Surgeon: Isabel Luna, MD;  Location: [place];  Service: General;  Laterality: Right;   BREAST SURGERY Right 03/1998   breast biopsy, benign   EYE SURGERY Right    PORT-A-CATH REMOVAL Right 04/30/2021   Procedure: REMOVAL PORT-A-CATH;  Surgeon: Isabel Luna, MD;  Location: [place];  Service: General;  Laterality: Right;   PORTACATH PLACEMENT Right 01/22/2020   Procedure: INSERTION PORT-A-CATH WITH ULTRASOUND;  Surgeon: Isabel Luna, MD;  Location: [place];  Service: General;  Laterality: Right;   PORTACATH PLACEMENT Right 02/28/2020   Procedure: PORT A CATH REVISION;  Surgeon: Isabel Luna, MD;  Location: [place];  Service: General;  Laterality: Right;   REVERSE SHOULDER ARTHROPLASTY Left 02/02/2021   Procedure: LEFT REVERSE SHOULDER ARTHROPLASTY;  Surgeon: Isabel Pel, MD;  Location: [place];  Service: Orthopedics;  Laterality: Left;   SHOULDER SURGERY Right    SPINAL FUSION  03/04/2011   with ORIF   Social History   Occupational History   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

## 2022-01-11 ENCOUNTER — Other Ambulatory Visit: Payer: Self-pay

## 2022-01-11 ENCOUNTER — Ambulatory Visit (INDEPENDENT_AMBULATORY_CARE_PROVIDER_SITE_OTHER): Payer: Medicare PPO | Admitting: Physical Therapy

## 2022-01-11 DIAGNOSIS — M6281 Muscle weakness (generalized): Secondary | ICD-10-CM | POA: Diagnosis not present

## 2022-01-11 DIAGNOSIS — M25561 Pain in right knee: Secondary | ICD-10-CM | POA: Diagnosis not present

## 2022-01-11 DIAGNOSIS — G8929 Other chronic pain: Secondary | ICD-10-CM | POA: Diagnosis not present

## 2022-01-11 DIAGNOSIS — M545 Low back pain, unspecified: Secondary | ICD-10-CM | POA: Diagnosis not present

## 2022-01-11 DIAGNOSIS — R262 Difficulty in walking, not elsewhere classified: Secondary | ICD-10-CM | POA: Diagnosis not present

## 2022-01-11 DIAGNOSIS — R2689 Other abnormalities of gait and mobility: Secondary | ICD-10-CM | POA: Diagnosis not present

## 2022-01-11 NOTE — Therapy (Signed)
[facility] Physical Therapy [address] [place], [place], [address] Phone: [phone]   Fax:  [phone]  Physical Therapy Treatment  Patient Details  Name: [name] MRN: [id number] Date of Birth: December 25, 1950 Referring Provider (PT): Donella Stade, [place]   Encounter Date: 01/11/2022   PT End of Session - 01/11/22 1424     Visit Number 26    Number of Visits 27    Date for PT Re-Evaluation 01/16/22    Authorization Type [employer]    Authorization Time Period from 01/04/22 to 03/04/22    Authorization - Visit Number 2    Authorization - Number of Visits 12   did new hamana auth 01/04/22   Progress Note Due on Visit 29    PT Start Time 1330    PT Stop Time 1416    PT Time Calculation (min) 46 min    Activity Tolerance Patient tolerated treatment well    Behavior During Therapy [facility] for tasks assessed/performed             Past Medical History:  Diagnosis Date   Anxiety    Atrial fibrillation [facility])    sees Dr. Audie Box   Cancer [facility])    breast   Chronic pain    Concussion 02/2007   ICU x 3 days   Depression    Hypertension    Hypothyroidism    Personal history of chemotherapy    Personal history of radiation therapy    Spinal stenosis    Thyroid disease ?1994    Past Surgical History:  Procedure Laterality Date   BREAST BIOPSY Right 12/20/2019   x2   BREAST LUMPECTOMY Right 01/22/2020   BREAST LUMPECTOMY WITH RADIOACTIVE SEED AND SENTINEL LYMPH NODE BIOPSY Right 01/22/2020   Procedure: RIGHT BREAST LUMPECTOMY WITH RADIOACTIVE SEED X2 AND RIGHT SENTINEL LYMPH NODE MAPPING;  Surgeon: Erroll Luna, MD;  Location: [place];  Service: General;  Laterality: Right;   BREAST SURGERY Right 03/1998   breast biopsy, benign   EYE SURGERY Right    PORT-A-CATH REMOVAL Right 04/30/2021   Procedure: REMOVAL PORT-A-CATH;  Surgeon: Erroll Luna, MD;  Location: [place];  Service: General;  Laterality: Right;    PORTACATH PLACEMENT Right 01/22/2020   Procedure: INSERTION PORT-A-CATH WITH ULTRASOUND;  Surgeon: Erroll Luna, MD;  Location: [place];  Service: General;  Laterality: Right;   PORTACATH PLACEMENT Right 02/28/2020   Procedure: PORT A CATH REVISION;  Surgeon: Erroll Luna, MD;  Location: [place];  Service: General;  Laterality: Right;   REVERSE SHOULDER ARTHROPLASTY Left 02/02/2021   Procedure: LEFT REVERSE SHOULDER ARTHROPLASTY;  Surgeon: Meredith Pel, MD;  Location: [place];  Service: Orthopedics;  Laterality: Left;   SHOULDER SURGERY Right    SPINAL FUSION  03/04/2011   with ORIF    There were no vitals filed for this visit.   Subjective Assessment - 01/11/22 1407     Subjective she relays her back feels less than 2/10 pain overall.    Pertinent History anxiety, breast cancer with lumpectomy and radioactive seed placement 2021 and recent port removal, chronic pain, concussion 2008, HTN, depression, spinal fusion 2012.    Patient Stated Goals improve gait and balance and back pain    Pain Onset More than a month ago    Pain Onset More than a month ago  [place] Adult PT Treatment/Exercise - 01/11/22 0001       Ambulation/Gait   Ambulation/Gait Yes    Ambulation/Gait Assistance 5: Supervision    Ambulation Distance (Feet) 200 Feet    Assistive device None      Neuro Re-ed    Neuro Re-ed Details  tandem walk and grapevine walk 3 round trips at counter with intermitt UE support PRN. Single leg heel taps on small trashcan X10 bilat      Lumbar Exercises: Aerobic   Nustep L8 x 15 min with intervals each 30 sec of high/low intensity effort for functional endurance and leg strength      Lumbar Exercises: Machines for Strengthening   Leg Press 125# 2X15, SL 75# 3X10 bilat      Lumbar Exercises: Standing   Other Standing Lumbar Exercises step ups fwd and lateral on 6 inch step X10 bilat with one UE support                        PT Short Term Goals - 12/17/21 1438       PT SHORT TERM GOAL #1   Title independent with intial HEP    Baseline met 10/19 but continue to progress    Time 4    Period Weeks    Status Achieved    Target Date 10/08/21      PT SHORT TERM GOAL #2   Title improve TUG to less than 17 seconds    Baseline Met 10/19, will make new goal for this    Time 4    Period Weeks    Status Achieved    Target Date 10/08/21      PT SHORT TERM GOAL #3   Title Pt will improve TUG time to less than 11 seconds without AD to improve gait speed and balance.    Baseline 10.45 on 11/2    Time 4    Period Weeks    Status Achieved    Target Date 11/04/21               PT Long Term Goals - 11/16/21 1355       PT LONG TERM GOAL #1   Title She will improve BERG balance score to 46 or more to show improved balance    Baseline scored 51 today 11/2    Time 12    Period Weeks    Status Achieved      PT LONG TERM GOAL #2   Title She will improve TUG score to <10 seconds  to show improved balance and walk speed.    Baseline improved to 10.45 on 11/2    Period Weeks    Status Achieved      PT LONG TERM GOAL #3   Title report back pain < 3/10 with activity for improved function    Baseline depends, can still get up to 5 at times    Time 4    Period Weeks    Status On-going    Target Date 12/14/21      PT LONG TERM GOAL #4   Title Improve bilat knee/hip strength to 5/5 MMT tested in sitting    Baseline now 4+ for hip and 5 for knee    Time 12    Period Weeks    Status On-going    Target Date 12/14/21      PT LONG TERM GOAL #5   Title improve FGA to at least 24/30 for  improved mobility and balance    Baseline 19/24    Time 4    Period Weeks    Status New    Target Date 12/14/21      PT LONG TERM GOAL #6   Title -      PT LONG TERM GOAL #7   Title -                   Plan - 01/11/22 1338     Clinical Impression Statement She was  having less pain today so we were able to progress her with strength and blance program today and she had good tolerance to this. She will need recert next visit    Personal Factors and Comorbidities Comorbidity 3+    Comorbidities anxiety, breast cancer with lumpectomy and radioactive seed placement 2021 and recent port removal, chronic pain, concussion 2008, HTN, depression, spinal fusion 2012    Examination-Activity Limitations Locomotion Level;Reach Overhead;Dressing;Hygiene/Grooming;Lift;Toileting;Bathing    Examination-Participation Restrictions Cleaning;Meal Prep;Community Activity;Driving;Laundry    Stability/Clinical Decision Making Evolving/Moderate complexity    Rehab Potential Fair    PT Frequency 2x / week    PT Duration 4 weeks    PT Treatment/Interventions ADLs/Self Care Home Management;Therapeutic exercise;Patient/family education;Cryotherapy;DME Instruction;Ultrasound;Moist Heat;Functional mobility training;Therapeutic activities;Balance training;Neuromuscular re-education;Manual techniques;Vasopneumatic Device;Taping;Dry needling;Passive range of motion;Electrical Stimulation;Gait training    PT Next Visit Plan recert    PT Home Exercise Plan Access Code: [email or web address], added tandem balance and SLS    Consulted and Agree with Plan of Care Patient             Patient will benefit from skilled therapeutic intervention in order to improve the following deficits and impairments:  Postural dysfunction, Decreased range of motion, Decreased knowledge of precautions, Increased edema, Impaired UE functional use, Decreased strength, Decreased mobility, Decreased balance, Pain, Difficulty walking, Decreased activity tolerance, Decreased endurance  Visit Diagnosis: Other abnormalities of gait and mobility  Chronic bilateral low back pain without sciatica  Acute pain of right knee  Muscle weakness (generalized)  Difficulty in walking, not elsewhere classified     Problem  List Patient Active Problem List   Diagnosis Date Noted   Arthritis of left shoulder region    S/P reverse total shoulder arthroplasty, 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   Vitamin D deficiency 01/15/2021   Vitamin B12 deficiency 01/15/2021   Primary insomnia 01/15/2021   Morbid obesity ([place]) 01/15/2021   Moderate recurrent major depression ([place]) 01/15/2021   Benzodiazepine dependence ([place]) 01/15/2021   Port-A-Cath in place 07/22/2020   Left leg cellulitis 04/17/2020   Cellulitis of left leg 04/17/2020   Hypokalemia 04/17/2020   Macrocytic anemia 04/17/2020   Anxiety 04/17/2020   Depression 04/17/2020   Chronic diarrhea 04/17/2020   Chemotherapy induced diarrhea 04/17/2020   Malignant neoplasm of upper-inner quadrant of right breast in female, estrogen receptor positive ([place]) 12/26/2019   Encephalopathy 02/02/2018   Hypothyroidism 02/02/2018   AKI (acute kidney injury) ([place]) 02/02/2018   Chronic back pain 02/02/2018   Alcohol abuse 02/02/2018   Weight gain 02/02/2018   Benign essential HTN 02/02/2018    Debbe Odea, PT,DPT 01/11/2022, 2:54 PM  [facility] Physical Therapy [address] [place], [place], [address] Phone: [phone]   Fax:  [phone]  Name: [name] MRN: [id number] Date of Birth: 1951-11-07

## 2022-01-13 ENCOUNTER — Ambulatory Visit (INDEPENDENT_AMBULATORY_CARE_PROVIDER_SITE_OTHER): Payer: Medicare PPO | Admitting: Physical Therapy

## 2022-01-13 ENCOUNTER — Other Ambulatory Visit: Payer: Self-pay

## 2022-01-13 DIAGNOSIS — M25561 Pain in right knee: Secondary | ICD-10-CM | POA: Diagnosis not present

## 2022-01-13 DIAGNOSIS — M545 Low back pain, unspecified: Secondary | ICD-10-CM

## 2022-01-13 DIAGNOSIS — R2689 Other abnormalities of gait and mobility: Secondary | ICD-10-CM | POA: Diagnosis not present

## 2022-01-13 DIAGNOSIS — R262 Difficulty in walking, not elsewhere classified: Secondary | ICD-10-CM

## 2022-01-13 DIAGNOSIS — M6281 Muscle weakness (generalized): Secondary | ICD-10-CM

## 2022-01-13 DIAGNOSIS — G8929 Other chronic pain: Secondary | ICD-10-CM | POA: Diagnosis not present

## 2022-01-14 ENCOUNTER — Encounter: Payer: Self-pay | Admitting: Physical Therapy

## 2022-01-14 NOTE — Therapy (Addendum)
[facility] Physical Therapy [address] [place], [place], [address] Phone: [phone]   Fax:  [phone]  Physical Therapy Treatment/Recert/Progress note Progress Note reporting period date 12/15/22 to 01/13/22  See below for objective and subjective measurements relating to patients progress with PT.    Patient Details  Name: [name] MRN: [id number] Date of Birth: 10-18-1951 Referring Provider (PT): Donella Stade, [place]   Encounter Date: 01/13/2022   PT End of Session - 01/14/22 0941     Visit Number 27    Number of Visits 36    Date for PT Re-Evaluation 02/18/22    Authorization Type [employer]    Authorization Time Period from 01/04/22 to 03/04/22    Authorization - Visit Number 3    Authorization - Number of Visits 12   did new ham[employer] auth 01/04/22   Progress Note Due on Visit 29    PT Start Time 1345    PT Stop Time 1430    PT Time Calculation (min) 45 min    Activity Tolerance Patient tolerated treatment well    Behavior During Therapy [facility] for tasks assessed/performed             Past Medical History:  Diagnosis Date   Anxiety    Atrial fibrillation [facility])    sees Dr. Audie Box   Cancer [facility])    breast   Chronic pain    Concussion 02/2007   ICU x 3 days   Depression    Hypertension    Hypothyroidism    Personal history of chemotherapy    Personal history of radiation therapy    Spinal stenosis    Thyroid disease ?1994    Past Surgical History:  Procedure Laterality Date   BREAST BIOPSY Right 12/20/2019   x2   BREAST LUMPECTOMY Right 01/22/2020   BREAST LUMPECTOMY WITH RADIOACTIVE SEED AND SENTINEL LYMPH NODE BIOPSY Right 01/22/2020   Procedure: RIGHT BREAST LUMPECTOMY WITH RADIOACTIVE SEED X2 AND RIGHT SENTINEL LYMPH NODE MAPPING;  Surgeon: Erroll Luna, MD;  Location: [place];  Service: General;  Laterality: Right;   BREAST SURGERY Right 03/1998   breast biopsy, benign   EYE SURGERY Right     PORT-A-CATH REMOVAL Right 04/30/2021   Procedure: REMOVAL PORT-A-CATH;  Surgeon: Erroll Luna, MD;  Location: [place];  Service: General;  Laterality: Right;   PORTACATH PLACEMENT Right 01/22/2020   Procedure: INSERTION PORT-A-CATH WITH ULTRASOUND;  Surgeon: Erroll Luna, MD;  Location: [place];  Service: General;  Laterality: Right;   PORTACATH PLACEMENT Right 02/28/2020   Procedure: PORT A CATH REVISION;  Surgeon: Erroll Luna, MD;  Location: [place];  Service: General;  Laterality: Right;   REVERSE SHOULDER ARTHROPLASTY Left 02/02/2021   Procedure: LEFT REVERSE SHOULDER ARTHROPLASTY;  Surgeon: Meredith Pel, MD;  Location: [place];  Service: Orthopedics;  Laterality: Left;   SHOULDER SURGERY Right    SPINAL FUSION  03/04/2011   with ORIF    There were no vitals filed for this visit.   Subjective Assessment - 01/13/22 1420     Subjective She relays her back is not bad today. She feels she needs to continue with PT    Pertinent History anxiety, breast cancer with lumpectomy and radioactive seed placement 2021 and recent port removal, chronic pain, concussion 2008, HTN, depression, spinal fusion 2012.    Patient Stated Goals improve gait and balance and back pain    Pain Onset More than a month ago  Pain Onset More than a month ago                [facility] PT Assessment - 01/14/22 0001       Ambulation/Gait   Ambulation/Gait Yes    Ambulation/Gait Assistance 5: Supervision    Ambulation Distance (Feet) 200 Feet    Assistive device None      Standardized Balance Assessment   Five times sit to stand comments  16 without UE support      Timed Up and Go Test   Normal TUG (seconds) 11                           [facility] Adult PT Treatment/Exercise - 01/14/22 0001       Neuro Re-ed    Neuro Re-ed Details  tandem walk and grapevine walk 3 round trips at counter with intermitt UE support PRN. Single leg heel taps on  6inch step X10 bilat      Lumbar Exercises: Aerobic   Nustep L8 x 15 min with intervals each 30 sec of high/low intensity effort for functional endurance and leg strength      Lumbar Exercises: Machines for Strengthening   Leg Press 125# 2X15, SL 75# 3X10 bilat      Lumbar Exercises: Standing   Other Standing Lumbar Exercises step ups fwd and lateral on 6 inch step X10 bilat with one UE support                       PT Short Term Goals - 12/17/21 1438       PT SHORT TERM GOAL #1   Title independent with intial HEP    Baseline met 10/19 but continue to progress    Time 4    Period Weeks    Status Achieved    Target Date 10/08/21      PT SHORT TERM GOAL #2   Title improve TUG to less than 17 seconds    Baseline Met 10/19, will make new goal for this    Time 4    Period Weeks    Status Achieved    Target Date 10/08/21      PT SHORT TERM GOAL #3   Title Pt will improve TUG time to less than 11 seconds without AD to improve gait speed and balance.    Baseline 10.45 on 11/2    Time 4    Period Weeks    Status Achieved    Target Date 11/04/21               PT Long Term Goals - 01/14/22 1003       PT LONG TERM GOAL #1   Title She will improve BERG balance score to 46 or more to show improved balance    Baseline scored 51 today 11/2    Time 12    Period Weeks    Status Achieved    Target Date 12/03/21      PT LONG TERM GOAL #2   Title She will improve TUG score to <10 seconds  to show improved balance and walk speed.    Baseline improved to 11 sec on 1/26    Time 12    Period Weeks    Status Achieved    Target Date 02/18/22      PT LONG TERM GOAL #3   Title report back pain < 3/10 with activity for improved function  Baseline depends, can still get up to 5 at times    Status On-going    Target Date 02/18/22      PT LONG TERM GOAL #4   Title Improve bilat knee/hip strength to 5/5 MMT tested in sitting    Baseline now 4+ for hip and 5 for  knee    Time 12    Period Weeks    Status On-going    Target Date 02/18/22      PT LONG TERM GOAL #5   Title improve FGA to at least 24/30 for improved mobility and balance    Baseline 19/24    Status New    Target Date 02/18/22      PT LONG TERM GOAL #6   Title -      PT LONG TERM GOAL #7   Title -                   Plan - 01/14/22 1000     Clinical Impression Statement Her back pain has been better managed this week so we have been able to focus more on her strength, balance, and functional endurance. Her PT plan of care is expiring so we will recert her for another 4 weeks as she still has deficits in strength, gait, and balance limiting her functional abilities.    Personal Factors and Comorbidities Comorbidity 3+    Comorbidities anxiety, breast cancer with lumpectomy and radioactive seed placement 2021 and recent port removal, chronic pain, concussion 2008, HTN, depression, spinal fusion 2012    Examination-Activity Limitations Locomotion Level;Reach Overhead;Dressing;Hygiene/Grooming;Lift;Toileting;Bathing    Examination-Participation Restrictions Cleaning;Meal Prep;Community Activity;Driving;Laundry    Stability/Clinical Decision Making Evolving/Moderate complexity    Rehab Potential Fair    PT Frequency 2x / week    PT Duration 4 weeks    PT Treatment/Interventions ADLs/Self Care Home Management;Therapeutic exercise;Patient/family education;Cryotherapy;DME Instruction;Ultrasound;Moist Heat;Functional mobility training;Therapeutic activities;Balance training;Neuromuscular re-education;Manual techniques;Vasopneumatic Device;Taping;Dry needling;Passive range of motion;Electrical Stimulation;Gait training    PT Next Visit Plan progress functional gait, balance, strength, and endurance as able.    PT Home Exercise Plan Access Code: [email or web address], added tandem balance and SLS    Consulted and Agree with Plan of Care Patient             Patient will benefit from  skilled therapeutic intervention in order to improve the following deficits and impairments:  Postural dysfunction, Decreased range of motion, Decreased knowledge of precautions, Increased edema, Impaired UE functional use, Decreased strength, Decreased mobility, Decreased balance, Pain, Difficulty walking, Decreased activity tolerance, Decreased endurance  Visit Diagnosis: Other abnormalities of gait and mobility  Chronic bilateral low back pain without sciatica  Acute pain of right knee  Muscle weakness (generalized)  Difficulty in walking, not elsewhere classified     Problem List Patient Active Problem List   Diagnosis Date Noted   Arthritis of left shoulder region    S/P reverse total shoulder arthroplasty, 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   Vitamin D deficiency 01/15/2021   Vitamin B12 deficiency 01/15/2021   Primary insomnia 01/15/2021   Morbid obesity ([place]) 01/15/2021   Moderate recurrent major depression ([place]) 01/15/2021   Benzodiazepine dependence ([place]) 01/15/2021   Port-A-Cath in place 07/22/2020   Left leg cellulitis 04/17/2020   Cellulitis of left leg 04/17/2020   Hypokalemia 04/17/2020   Macrocytic anemia 04/17/2020   Anxiety 04/17/2020   Depression 04/17/2020   Chronic diarrhea 04/17/2020   Chemotherapy induced diarrhea 04/17/2020   Malignant neoplasm of upper-inner quadrant of right breast in female,  estrogen receptor positive ([place]) 12/26/2019   Encephalopathy 02/02/2018   Hypothyroidism 02/02/2018   AKI (acute kidney injury) ([place]) 02/02/2018   Chronic back pain 02/02/2018   Alcohol abuse 02/02/2018   Weight gain 02/02/2018   Benign essential HTN 02/02/2018    Debbe Odea, PT,DPT 01/14/2022, 10:07 AM  [facility] Physical Therapy [address] [place], [place], [address] Phone: [phone]   Fax:  [phone]  Name: [name] MRN: [id number] Date of Birth: 11-24-1951

## 2022-01-25 ENCOUNTER — Other Ambulatory Visit: Payer: Self-pay | Admitting: Hematology and Oncology

## 2022-01-27 ENCOUNTER — Other Ambulatory Visit: Payer: Self-pay

## 2022-01-27 ENCOUNTER — Ambulatory Visit (INDEPENDENT_AMBULATORY_CARE_PROVIDER_SITE_OTHER): Payer: Medicare PPO | Admitting: Physical Therapy

## 2022-01-27 DIAGNOSIS — R262 Difficulty in walking, not elsewhere classified: Secondary | ICD-10-CM

## 2022-01-27 DIAGNOSIS — M6281 Muscle weakness (generalized): Secondary | ICD-10-CM | POA: Diagnosis not present

## 2022-01-27 DIAGNOSIS — R2689 Other abnormalities of gait and mobility: Secondary | ICD-10-CM | POA: Diagnosis not present

## 2022-01-27 DIAGNOSIS — M25561 Pain in right knee: Secondary | ICD-10-CM | POA: Diagnosis not present

## 2022-01-27 DIAGNOSIS — G8929 Other chronic pain: Secondary | ICD-10-CM

## 2022-01-27 DIAGNOSIS — M545 Low back pain, unspecified: Secondary | ICD-10-CM | POA: Diagnosis not present

## 2022-01-27 NOTE — Therapy (Signed)
[facility] [address] [place], [place], [address] Phone: [phone]   Fax:  [phone]  Physical Therapy Treatment  Patient Details  Name: [name] MRN: [id number] Date of Birth: 09-Jan-1951 Referring Provider (PT): Donella Stade, [place]   Encounter Date: 01/27/2022   PT End of Session - 01/27/22 1449     Visit Number 28    Number of Visits 36    Date for PT Re-Evaluation 02/18/22    Authorization Type [employer]    Authorization Time Period from 01/04/22 to 03/04/22    Authorization - Visit Number 4    Authorization - Number of Visits 12   did new hamana auth 01/04/22   Progress Note Due on Visit 14    PT Start Time 1350    PT Stop Time 1435    PT Time Calculation (min) 45 min    Activity Tolerance Patient tolerated treatment well    Behavior During Therapy [facility] for tasks assessed/performed             Past Medical History:  Diagnosis Date   Anxiety    Atrial fibrillation [facility])    sees Dr. Audie Box   Cancer [facility])    breast   Chronic pain    Concussion 02/2007   ICU x 3 days   Depression    Hypertension    Hypothyroidism    Personal history of chemotherapy    Personal history of radiation therapy    Spinal stenosis    Thyroid disease ?1994    Past Surgical History:  Procedure Laterality Date   BREAST BIOPSY Right 12/20/2019   x2   BREAST LUMPECTOMY Right 01/22/2020   BREAST LUMPECTOMY WITH RADIOACTIVE SEED AND SENTINEL LYMPH NODE BIOPSY Right 01/22/2020   Procedure: RIGHT BREAST LUMPECTOMY WITH RADIOACTIVE SEED X2 AND RIGHT SENTINEL LYMPH NODE MAPPING;  Surgeon: Erroll Luna, MD;  Location: [place];  Service: General;  Laterality: Right;   BREAST SURGERY Right 03/1998   breast biopsy, benign   EYE SURGERY Right    PORT-A-CATH REMOVAL Right 04/30/2021   Procedure: REMOVAL PORT-A-CATH;  Surgeon: Erroll Luna, MD;  Location: [place];  Service: General;  Laterality: Right;    PORTACATH PLACEMENT Right 01/22/2020   Procedure: INSERTION PORT-A-CATH WITH ULTRASOUND;  Surgeon: Erroll Luna, MD;  Location: [place];  Service: General;  Laterality: Right;   PORTACATH PLACEMENT Right 02/28/2020   Procedure: PORT A CATH REVISION;  Surgeon: Erroll Luna, MD;  Location: [place];  Service: General;  Laterality: Right;   REVERSE SHOULDER ARTHROPLASTY Left 02/02/2021   Procedure: LEFT REVERSE SHOULDER ARTHROPLASTY;  Surgeon: Meredith Pel, MD;  Location: [place];  Service: Orthopedics;  Laterality: Left;   SHOULDER SURGERY Right    SPINAL FUSION  03/04/2011   with ORIF    There were no vitals filed for this visit.   Subjective Assessment - 01/27/22 1444     Subjective She relays her back is really bothering her, She says she had MD follow up about this and didnt get a lot of answers. Her pain levels vary and sometimes she does not have pain and somedays the pain is excrutiating without any specific aggravating factors. She requests DN today to her Left low back.    Pertinent History anxiety, breast cancer with lumpectomy and radioactive seed placement 2021 and recent port removal, chronic pain, concussion 2008, HTN, depression, spinal fusion 2012.    Patient Stated Goals improve gait and balance  and back pain    Pain Onset More than a month ago    Pain Onset More than a month ago              [facility] Adult PT Treatment/Exercise - 01/27/22 0001       Ambulation/Gait   Ambulation/Gait Yes    Ambulation/Gait Assistance 5: Supervision    Ambulation Distance (Feet) 200 Feet    Assistive device None      Lumbar Exercises: Aerobic   Nustep L7 x 15 min with intervals each 30 sec of high/low intensity effort for functional endurance and leg strength (used heat on her low back with this today due to back pain)      Lumbar Exercises: Machines for Strengthening   Leg Press 125# 2X15, SL 75# 3X10 bilat      Manual Therapy   Manual therapy  comments compression and skilled palpation during DN              Trigger Point Dry Needling - 01/27/22 0001     Consent Given? Yes    Education Handout Provided Previously provided    Muscles Treated Back/Hip Quadratus lumborum;Erector spinae;Lumbar multifidi    Dry Needling Comments good overall tolerance noted, no immediate adverse reaction, Estim today micro current with 100 frequency intensity to her tolerance                     PT Short Term Goals - 12/17/21 1438       PT SHORT TERM GOAL #1   Title independent with intial HEP    Baseline met 10/19 but continue to progress    Time 4    Period Weeks    Status Achieved    Target Date 10/08/21      PT SHORT TERM GOAL #2   Title improve TUG to less than 17 seconds    Baseline Met 10/19, will make new goal for this    Time 4    Period Weeks    Status Achieved    Target Date 10/08/21      PT SHORT TERM GOAL #3   Title Pt will improve TUG time to less than 11 seconds without AD to improve gait speed and balance.    Baseline 10.45 on 11/2    Time 4    Period Weeks    Status Achieved    Target Date 11/04/21               PT Long Term Goals - 01/27/22 1457       PT LONG TERM GOAL #1   Title She will improve BERG balance score to 46 or more to show improved balance    Baseline scored 51 today 11/2    Time 12    Period Weeks    Status Achieved    Target Date 12/03/21      PT LONG TERM GOAL #2   Title She will improve TUG score to <10 seconds  to show improved balance and walk speed.    Baseline improved to 11 sec on 1/26    Time 12    Period Weeks    Status Achieved    Target Date 02/18/22      PT LONG TERM GOAL #3   Title report back pain < 3/10 with activity for improved function    Baseline depends, can still get up to 5 at times    Status On-going    Target Date 02/18/22  PT LONG TERM GOAL #4   Title Improve bilat knee/hip strength to 5/5 MMT tested in sitting    Baseline now  4+ for hip and 5 for knee    Time 12    Period Weeks    Status On-going    Target Date 02/18/22      PT LONG TERM GOAL #5   Title improve FGA to at least 24/30 for improved mobility and balance    Baseline 19/24    Status New    Target Date 02/18/22      PT LONG TERM GOAL #6   Title -      PT LONG TERM GOAL #7   Title -                   Plan - 01/27/22 1451     Clinical Impression Statement Session limited by back pain but she did get some relief from DN today in combo with Estim. She then was able to work on her funcitonal endurance and leg strength with improved activity tolerance. Pt recommending to continue POC.    Personal Factors and Comorbidities Comorbidity 3+    Comorbidities anxiety, breast cancer with lumpectomy and radioactive seed placement 2021 and recent port removal, chronic pain, concussion 2008, HTN, depression, spinal fusion 2012    Examination-Activity Limitations Locomotion Level;Reach Overhead;Dressing;Hygiene/Grooming;Lift;Toileting;Bathing    Examination-Participation Restrictions Cleaning;Meal Prep;Community Activity;Driving;Laundry    Stability/Clinical Decision Making Evolving/Moderate complexity    Rehab Potential Fair    PT Frequency 2x / week    PT Duration 4 weeks    PT Treatment/Interventions ADLs/Self Care Home Management;Therapeutic exercise;Patient/family education;Cryotherapy;DME Instruction;Ultrasound;Moist Heat;Functional mobility training;Therapeutic activities;Balance training;Neuromuscular re-education;Manual techniques;Vasopneumatic Device;Taping;Dry needling;Passive range of motion;Electrical Stimulation;Gait training    PT Next Visit Plan how is back pain? progress functional gait, balance, strength, and endurance as able.    PT Home Exercise Plan Access Code: [email or web address], added tandem balance and SLS    Consulted and Agree with Plan of Care Patient             Patient will benefit from skilled therapeutic intervention in  order to improve the following deficits and impairments:  Postural dysfunction, Decreased range of motion, Decreased knowledge of precautions, Increased edema, Impaired UE functional use, Decreased strength, Decreased mobility, Decreased balance, Pain, Difficulty walking, Decreased activity tolerance, Decreased endurance  Visit Diagnosis: Other abnormalities of gait and mobility  Chronic bilateral low back pain without sciatica  Acute pain of right knee  Muscle weakness (generalized)  Difficulty in walking, not elsewhere classified     Problem List Patient Active Problem List   Diagnosis Date Noted   Arthritis of left shoulder region    S/P reverse total shoulder arthroplasty, 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   Vitamin D deficiency 01/15/2021   Vitamin B12 deficiency 01/15/2021   Primary insomnia 01/15/2021   Morbid obesity ([place]) 01/15/2021   Moderate recurrent major depression ([place]) 01/15/2021   Benzodiazepine dependence ([place]) 01/15/2021   Port-A-Cath in place 07/22/2020   Left leg cellulitis 04/17/2020   Cellulitis of left leg 04/17/2020   Hypokalemia 04/17/2020   Macrocytic anemia 04/17/2020   Anxiety 04/17/2020   Depression 04/17/2020   Chronic diarrhea 04/17/2020   Chemotherapy induced diarrhea 04/17/2020   Malignant neoplasm of upper-inner quadrant of right breast in female, estrogen receptor positive ([place]) 12/26/2019   Encephalopathy 02/02/2018   Hypothyroidism 02/02/2018   AKI (acute kidney injury) ([place]) 02/02/2018   Chronic back pain 02/02/2018   Alcohol abuse 02/02/2018   Weight gain  02/02/2018   Benign essential HTN 02/02/2018    Debbe Odea, PT,DPT 01/27/2022, 2:58 PM  [facility] [address] [place], [place], [address] Phone: [phone]   Fax:  [phone]  Name: ROXANNE PANEK MRN: [id number] Date of Birth: 1951/04/15

## 2022-01-29 ENCOUNTER — Encounter: Payer: Self-pay | Admitting: Physical Therapy

## 2022-01-29 ENCOUNTER — Other Ambulatory Visit: Payer: Self-pay

## 2022-01-29 ENCOUNTER — Ambulatory Visit (INDEPENDENT_AMBULATORY_CARE_PROVIDER_SITE_OTHER): Payer: Medicare PPO | Admitting: Physical Therapy

## 2022-01-29 DIAGNOSIS — R262 Difficulty in walking, not elsewhere classified: Secondary | ICD-10-CM

## 2022-01-29 DIAGNOSIS — M545 Low back pain, unspecified: Secondary | ICD-10-CM

## 2022-01-29 DIAGNOSIS — R2689 Other abnormalities of gait and mobility: Secondary | ICD-10-CM | POA: Diagnosis not present

## 2022-01-29 DIAGNOSIS — M6281 Muscle weakness (generalized): Secondary | ICD-10-CM

## 2022-01-29 DIAGNOSIS — M25561 Pain in right knee: Secondary | ICD-10-CM

## 2022-01-29 DIAGNOSIS — G8929 Other chronic pain: Secondary | ICD-10-CM

## 2022-01-29 NOTE — Therapy (Signed)
[facility] [address] [place], [place], [address] Phone: [phone]   Fax:  [phone]  Physical Therapy Treatment  Patient Details  Name: [name] MRN: [id number] Date of Birth: November 21, 1951 Referring Provider (PT): Donella Stade, [place]   Encounter Date: 01/29/2022   PT End of Session - 01/29/22 1027     Visit Number 29    Number of Visits 36    Date for PT Re-Evaluation 02/18/22    Authorization Type [employer]    Authorization Time Period from 01/04/22 to 03/04/22    Authorization - Visit Number 5    Authorization - Number of Visits 12   did new hamana auth 01/04/22   Progress Note Due on Visit 50    PT Start Time 1013    PT Stop Time 1053    PT Time Calculation (min) 40 min    Activity Tolerance Patient tolerated treatment well    Behavior During Therapy [facility] for tasks assessed/performed             Past Medical History:  Diagnosis Date   Anxiety    Atrial fibrillation [facility])    sees Dr. Audie Box   Cancer [facility])    breast   Chronic pain    Concussion 02/2007   ICU x 3 days   Depression    Hypertension    Hypothyroidism    Personal history of chemotherapy    Personal history of radiation therapy    Spinal stenosis    Thyroid disease ?1994    Past Surgical History:  Procedure Laterality Date   BREAST BIOPSY Right 12/20/2019   x2   BREAST LUMPECTOMY Right 01/22/2020   BREAST LUMPECTOMY WITH RADIOACTIVE SEED AND SENTINEL LYMPH NODE BIOPSY Right 01/22/2020   Procedure: RIGHT BREAST LUMPECTOMY WITH RADIOACTIVE SEED X2 AND RIGHT SENTINEL LYMPH NODE MAPPING;  Surgeon: Erroll Luna, MD;  Location: [place];  Service: General;  Laterality: Right;   BREAST SURGERY Right 03/1998   breast biopsy, benign   EYE SURGERY Right    PORT-A-CATH REMOVAL Right 04/30/2021   Procedure: REMOVAL PORT-A-CATH;  Surgeon: Erroll Luna, MD;  Location: [place];  Service: General;  Laterality: Right;    PORTACATH PLACEMENT Right 01/22/2020   Procedure: INSERTION PORT-A-CATH WITH ULTRASOUND;  Surgeon: Erroll Luna, MD;  Location: [place];  Service: General;  Laterality: Right;   PORTACATH PLACEMENT Right 02/28/2020   Procedure: PORT A CATH REVISION;  Surgeon: Erroll Luna, MD;  Location: [place];  Service: General;  Laterality: Right;   REVERSE SHOULDER ARTHROPLASTY Left 02/02/2021   Procedure: LEFT REVERSE SHOULDER ARTHROPLASTY;  Surgeon: Meredith Pel, MD;  Location: [place];  Service: Orthopedics;  Laterality: Left;   SHOULDER SURGERY Right    SPINAL FUSION  03/04/2011   with ORIF    There were no vitals filed for this visit.   Subjective Assessment - 01/29/22 1029     Subjective doing well, feels DN was helpful last session    Pertinent History anxiety, breast cancer with lumpectomy and radioactive seed placement 2021 and recent port removal, chronic pain, concussion 2008, HTN, depression, spinal fusion 2012.    Patient Stated Goals improve gait and balance and back pain    Currently in Pain? Yes    Pain Score 3     Pain Location Back    Pain Orientation Left    Pain Descriptors / Indicators Aching;Sharp    Pain Type Acute pain  Pain Onset More than a month ago    Pain Frequency Constant    Aggravating Factors  static positioning    Pain Relieving Factors changing positions, heat                               [facility] Adult PT Treatment/Exercise - 01/29/22 1017       Lumbar Exercises: Aerobic   Nustep L7 x 15 min with intervals each 30 sec of high/low intensity effort for functional endurance and leg strength (used heat on her low back with this today due to back pain)      Lumbar Exercises: Machines for Strengthening   Cybex Knee Extension 20# 3X15    Cybex Knee Flexion 25# 3x15    Leg Press 125# 3X15, SL 75# 3X10 bilat                       PT Short Term Goals - 12/17/21 1438       PT SHORT TERM GOAL #1    Title independent with intial HEP    Baseline met 10/19 but continue to progress    Time 4    Period Weeks    Status Achieved    Target Date 10/08/21      PT SHORT TERM GOAL #2   Title improve TUG to less than 17 seconds    Baseline Met 10/19, will make new goal for this    Time 4    Period Weeks    Status Achieved    Target Date 10/08/21      PT SHORT TERM GOAL #3   Title Pt will improve TUG time to less than 11 seconds without AD to improve gait speed and balance.    Baseline 10.45 on 11/2    Time 4    Period Weeks    Status Achieved    Target Date 11/04/21               PT Long Term Goals - 01/27/22 1457       PT LONG TERM GOAL #1   Title She will improve BERG balance score to 46 or more to show improved balance    Baseline scored 51 today 11/2    Time 12    Period Weeks    Status Achieved    Target Date 12/03/21      PT LONG TERM GOAL #2   Title She will improve TUG score to <10 seconds  to show improved balance and walk speed.    Baseline improved to 11 sec on 1/26    Time 12    Period Weeks    Status Achieved    Target Date 02/18/22      PT LONG TERM GOAL #3   Title report back pain < 3/10 with activity for improved function    Baseline depends, can still get up to 5 at times    Status On-going    Target Date 02/18/22      PT LONG TERM GOAL #4   Title Improve bilat knee/hip strength to 5/5 MMT tested in sitting    Baseline now 4+ for hip and 5 for knee    Time 12    Period Weeks    Status On-going    Target Date 02/18/22      PT LONG TERM GOAL #5   Title improve FGA to at least 24/30 for improved  mobility and balance    Baseline 19/24    Status New    Target Date 02/18/22      PT LONG TERM GOAL #6   Title -      PT LONG TERM GOAL #7   Title -                   Plan - 01/29/22 1030     Clinical Impression Statement Pt tolerated session well today with focus on strengthening, and endurance.  Will continue to benefit from  PT to maximize function and endurance.    Personal Factors and Comorbidities Comorbidity 3+    Comorbidities anxiety, breast cancer with lumpectomy and radioactive seed placement 2021 and recent port removal, chronic pain, concussion 2008, HTN, depression, spinal fusion 2012    Examination-Activity Limitations Locomotion Level;Reach Overhead;Dressing;Hygiene/Grooming;Lift;Toileting;Bathing    Examination-Participation Restrictions Cleaning;Meal Prep;Community Activity;Driving;Laundry    Stability/Clinical Decision Making Evolving/Moderate complexity    Rehab Potential Fair    PT Frequency 2x / week    PT Duration 4 weeks    PT Treatment/Interventions ADLs/Self Care Home Management;Therapeutic exercise;Patient/family education;Cryotherapy;DME Instruction;Ultrasound;Moist Heat;Functional mobility training;Therapeutic activities;Balance training;Neuromuscular re-education;Manual techniques;Vasopneumatic Device;Taping;Dry needling;Passive range of motion;Electrical Stimulation;Gait training    PT Next Visit Plan progress functional balance, strength, and endurance as able.    PT Home Exercise Plan Access Code: [email or web address], added tandem balance and SLS    Consulted and Agree with Plan of Care Patient             Patient will benefit from skilled therapeutic intervention in order to improve the following deficits and impairments:  Postural dysfunction, Decreased range of motion, Decreased knowledge of precautions, Increased edema, Impaired UE functional use, Decreased strength, Decreased mobility, Decreased balance, Pain, Difficulty walking, Decreased activity tolerance, Decreased endurance  Visit Diagnosis: Other abnormalities of gait and mobility  Chronic bilateral low back pain without sciatica  Acute pain of right knee  Muscle weakness (generalized)  Difficulty in walking, not elsewhere classified     Problem List Patient Active Problem List   Diagnosis Date Noted   Arthritis of  left shoulder region    S/P reverse total shoulder arthroplasty, 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   Vitamin D deficiency 01/15/2021   Vitamin B12 deficiency 01/15/2021   Primary insomnia 01/15/2021   Morbid obesity ([place]) 01/15/2021   Moderate recurrent major depression ([place]) 01/15/2021   Benzodiazepine dependence ([place]) 01/15/2021   Port-A-Cath in place 07/22/2020   Left leg cellulitis 04/17/2020   Cellulitis of left leg 04/17/2020   Hypokalemia 04/17/2020   Macrocytic anemia 04/17/2020   Anxiety 04/17/2020   Depression 04/17/2020   Chronic diarrhea 04/17/2020   Chemotherapy induced diarrhea 04/17/2020   Malignant neoplasm of upper-inner quadrant of right breast in female, estrogen receptor positive ([place]) 12/26/2019   Encephalopathy 02/02/2018   Hypothyroidism 02/02/2018   AKI (acute kidney injury) ([place]) 02/02/2018   Chronic back pain 02/02/2018   Alcohol abuse 02/02/2018   Weight gain 02/02/2018   Benign essential HTN 02/02/2018     Laureen Abrahams, PT, DPT 01/29/22 10:56 AM    [facility] [address] [place], [place], [address] Phone: [phone]   Fax:  [phone]  Name: [name] MRN: [id number] Date of Birth: 1951/03/08

## 2022-02-03 ENCOUNTER — Encounter: Payer: Medicare PPO | Admitting: Physical Therapy

## 2022-02-05 ENCOUNTER — Other Ambulatory Visit: Payer: Self-pay

## 2022-02-05 ENCOUNTER — Ambulatory Visit (INDEPENDENT_AMBULATORY_CARE_PROVIDER_SITE_OTHER): Payer: Medicare PPO | Admitting: Physical Therapy

## 2022-02-05 ENCOUNTER — Encounter: Payer: Self-pay | Admitting: Physical Therapy

## 2022-02-05 DIAGNOSIS — G8929 Other chronic pain: Secondary | ICD-10-CM | POA: Diagnosis not present

## 2022-02-05 DIAGNOSIS — M545 Low back pain, unspecified: Secondary | ICD-10-CM

## 2022-02-05 DIAGNOSIS — M6281 Muscle weakness (generalized): Secondary | ICD-10-CM

## 2022-02-05 DIAGNOSIS — R2689 Other abnormalities of gait and mobility: Secondary | ICD-10-CM | POA: Diagnosis not present

## 2022-02-05 DIAGNOSIS — M25561 Pain in right knee: Secondary | ICD-10-CM

## 2022-02-05 NOTE — Therapy (Signed)
[facility] Physical Therapy [address] [place], [place], [address] Phone: [phone]   Fax:  [phone]  Physical Therapy Treatment  Patient Details  Name: [name] MRN: [id number] Date of Birth: 21-Oct-1951 Referring Provider (PT): Donella Stade, [place]   Encounter Date: 02/05/2022   PT End of Session - 02/05/22 1200     Visit Number 30    Number of Visits 36    Date for PT Re-Evaluation 02/18/22    Authorization Type [employer]    Authorization Time Period from 01/04/22 to 03/04/22    Authorization - Visit Number 6    Authorization - Number of Visits 12   did new hamana auth 01/04/22   Progress Note Due on Visit 78    PT Start Time 1106    PT Stop Time 1155    PT Time Calculation (min) 49 min    Activity Tolerance Patient tolerated treatment well    Behavior During Therapy [facility] for tasks assessed/performed             Past Medical History:  Diagnosis Date   Anxiety    Atrial fibrillation [facility])    sees Dr. Audie Box   Cancer [facility])    breast   Chronic pain    Concussion 02/2007   ICU x 3 days   Depression    Hypertension    Hypothyroidism    Personal history of chemotherapy    Personal history of radiation therapy    Spinal stenosis    Thyroid disease ?1994    Past Surgical History:  Procedure Laterality Date   BREAST BIOPSY Right 12/20/2019   x2   BREAST LUMPECTOMY Right 01/22/2020   BREAST LUMPECTOMY WITH RADIOACTIVE SEED AND SENTINEL LYMPH NODE BIOPSY Right 01/22/2020   Procedure: RIGHT BREAST LUMPECTOMY WITH RADIOACTIVE SEED X2 AND RIGHT SENTINEL LYMPH NODE MAPPING;  Surgeon: Erroll Luna, MD;  Location: [place];  Service: General;  Laterality: Right;   BREAST SURGERY Right 03/1998   breast biopsy, benign   EYE SURGERY Right    PORT-A-CATH REMOVAL Right 04/30/2021   Procedure: REMOVAL PORT-A-CATH;  Surgeon: Erroll Luna, MD;  Location: [place];  Service: General;  Laterality: Right;    PORTACATH PLACEMENT Right 01/22/2020   Procedure: INSERTION PORT-A-CATH WITH ULTRASOUND;  Surgeon: Erroll Luna, MD;  Location: [place];  Service: General;  Laterality: Right;   PORTACATH PLACEMENT Right 02/28/2020   Procedure: PORT A CATH REVISION;  Surgeon: Erroll Luna, MD;  Location: [place];  Service: General;  Laterality: Right;   REVERSE SHOULDER ARTHROPLASTY Left 02/02/2021   Procedure: LEFT REVERSE SHOULDER ARTHROPLASTY;  Surgeon: Meredith Pel, MD;  Location: [place];  Service: Orthopedics;  Laterality: Left;   SHOULDER SURGERY Right    SPINAL FUSION  03/04/2011   with ORIF    There were no vitals filed for this visit.   Subjective Assessment - 02/05/22 1139     Subjective She missed last week of PT due to stomach bug. Her back pain is controlled with pain meds today.    Pertinent History anxiety, breast cancer with lumpectomy and radioactive seed placement 2021 and recent port removal, chronic pain, concussion 2008, HTN, depression, spinal fusion 2012.    Patient Stated Goals improve gait and balance and back pain    Pain Onset More than a month ago              [facility] Adult PT Treatment/Exercise - 02/05/22 0001  Ambulation/Gait   Ambulation/Gait Yes    Ambulation/Gait Assistance 5: Supervision    Ambulation Distance (Feet) 200 Feet    Assistive device None      Lumbar Exercises: Aerobic   Nustep L7 x 15 min with intervals each 30 sec of high/low intensity effort for functional endurance and leg strength      Lumbar Exercises: Machines for Strengthening   Cybex Knee Extension 20# 2X15    Cybex Knee Flexion 25# 3x10    Leg Press 125# 3X15, SL 75# 3X10 bilat      Lumbar Exercises: Standing   Other Standing Lumbar Exercises Pball rolling up wall into bilat shoulder flexion and thoracic extension 5 sec X10      Lumbar Exercises: Seated   Other Seated Lumbar Exercises seated lumbar flexion stretch with pball 5 sec X 5 ea into  flexion and diagonals              PT Short Term Goals - 12/17/21 1438       PT SHORT TERM GOAL #1   Title independent with intial HEP    Baseline met 10/19 but continue to progress    Time 4    Period Weeks    Status Achieved    Target Date 10/08/21      PT SHORT TERM GOAL #2   Title improve TUG to less than 17 seconds    Baseline Met 10/19, will make new goal for this    Time 4    Period Weeks    Status Achieved    Target Date 10/08/21      PT SHORT TERM GOAL #3   Title Pt will improve TUG time to less than 11 seconds without AD to improve gait speed and balance.    Baseline 10.45 on 11/2    Time 4    Period Weeks    Status Achieved    Target Date 11/04/21               PT Long Term Goals - 01/27/22 1457       PT LONG TERM GOAL #1   Title She will improve BERG balance score to 46 or more to show improved balance    Baseline scored 51 today 11/2    Time 12    Period Weeks    Status Achieved    Target Date 12/03/21      PT LONG TERM GOAL #2   Title She will improve TUG score to <10 seconds  to show improved balance and walk speed.    Baseline improved to 11 sec on 1/26    Time 12    Period Weeks    Status Achieved    Target Date 02/18/22      PT LONG TERM GOAL #3   Title report back pain < 3/10 with activity for improved function    Baseline depends, can still get up to 5 at times    Status On-going    Target Date 02/18/22      PT LONG TERM GOAL #4   Title Improve bilat knee/hip strength to 5/5 MMT tested in sitting    Baseline now 4+ for hip and 5 for knee    Time 12    Period Weeks    Status On-going    Target Date 02/18/22      PT LONG TERM GOAL #5   Title improve FGA to at least 24/30 for improved mobility and balance    Baseline  19/24    Status New    Target Date 02/18/22      PT LONG TERM GOAL #6   Title -      PT LONG TERM GOAL #7   Title -                   Plan - 02/05/22 1151     Clinical Impression  Statement She was having less back pain today however this may be due to meds. She relays she does not feel her balance will improve anymore so she does not feel she needs to work on this today. She did have overall improved tolance to her strengthening program today. PT recommending to continue POC.    Personal Factors and Comorbidities Comorbidity 3+    Comorbidities anxiety, breast cancer with lumpectomy and radioactive seed placement 2021 and recent port removal, chronic pain, concussion 2008, HTN, depression, spinal fusion 2012    Examination-Activity Limitations Locomotion Level;Reach Overhead;Dressing;Hygiene/Grooming;Lift;Toileting;Bathing    Examination-Participation Restrictions Cleaning;Meal Prep;Community Activity;Driving;Laundry    Stability/Clinical Decision Making Evolving/Moderate complexity    Rehab Potential Fair    PT Frequency 2x / week    PT Duration 4 weeks    PT Treatment/Interventions ADLs/Self Care Home Management;Therapeutic exercise;Patient/family education;Cryotherapy;DME Instruction;Ultrasound;Moist Heat;Functional mobility training;Therapeutic activities;Balance training;Neuromuscular re-education;Manual techniques;Vasopneumatic Device;Taping;Dry needling;Passive range of motion;Electrical Stimulation;Gait training    PT Next Visit Plan progress functional strength, and endurance as able.    PT Home Exercise Plan Access Code: [email or web address], added tandem balance and SLS    Consulted and Agree with Plan of Care Patient             Patient will benefit from skilled therapeutic intervention in order to improve the following deficits and impairments:  Postural dysfunction, Decreased range of motion, Decreased knowledge of precautions, Increased edema, Impaired UE functional use, Decreased strength, Decreased mobility, Decreased balance, Pain, Difficulty walking, Decreased activity tolerance, Decreased endurance  Visit Diagnosis: Other abnormalities of gait and  mobility  Chronic bilateral low back pain without sciatica  Acute pain of right knee  Muscle weakness (generalized)     Problem List Patient Active Problem List   Diagnosis Date Noted   Arthritis of left shoulder region    S/P reverse total shoulder arthroplasty, 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   Vitamin D deficiency 01/15/2021   Vitamin B12 deficiency 01/15/2021   Primary insomnia 01/15/2021   Morbid obesity ([place]) 01/15/2021   Moderate recurrent major depression ([place]) 01/15/2021   Benzodiazepine dependence ([place]) 01/15/2021   Port-A-Cath in place 07/22/2020   Left leg cellulitis 04/17/2020   Cellulitis of left leg 04/17/2020   Hypokalemia 04/17/2020   Macrocytic anemia 04/17/2020   Anxiety 04/17/2020   Depression 04/17/2020   Chronic diarrhea 04/17/2020   Chemotherapy induced diarrhea 04/17/2020   Malignant neoplasm of upper-inner quadrant of right breast in female, estrogen receptor positive ([place]) 12/26/2019   Encephalopathy 02/02/2018   Hypothyroidism 02/02/2018   AKI (acute kidney injury) ([place]) 02/02/2018   Chronic back pain 02/02/2018   Alcohol abuse 02/02/2018   Weight gain 02/02/2018   Benign essential HTN 02/02/2018    Debbe Odea, PT,DPT 02/05/2022, 12:02 PM  [facility] Physical Therapy [address] [place], [place], [address] Phone: [phone]   Fax:  [phone]  Name: [name] MRN: [id number] Date of Birth: April 11, 1951

## 2022-02-10 ENCOUNTER — Encounter: Payer: Self-pay | Admitting: Physical Therapy

## 2022-02-10 ENCOUNTER — Other Ambulatory Visit: Payer: Self-pay

## 2022-02-10 ENCOUNTER — Ambulatory Visit (INDEPENDENT_AMBULATORY_CARE_PROVIDER_SITE_OTHER): Payer: Medicare PPO | Admitting: Physical Therapy

## 2022-02-10 DIAGNOSIS — R262 Difficulty in walking, not elsewhere classified: Secondary | ICD-10-CM

## 2022-02-10 DIAGNOSIS — M25561 Pain in right knee: Secondary | ICD-10-CM

## 2022-02-10 DIAGNOSIS — G8929 Other chronic pain: Secondary | ICD-10-CM

## 2022-02-10 DIAGNOSIS — M6281 Muscle weakness (generalized): Secondary | ICD-10-CM

## 2022-02-10 DIAGNOSIS — M545 Low back pain, unspecified: Secondary | ICD-10-CM

## 2022-02-10 DIAGNOSIS — R2689 Other abnormalities of gait and mobility: Secondary | ICD-10-CM | POA: Diagnosis not present

## 2022-02-10 NOTE — Therapy (Signed)
[facility] Physical Therapy [address] [place], [place], [address] Phone: [phone]   Fax:  [phone]  Physical Therapy Treatment  Patient Details  Name: [name] MRN: [id number] Date of Birth: 04-06-1951 Referring Provider (PT): Donella Stade, [place]   Encounter Date: 02/10/2022   PT End of Session - 02/10/22 1412     Visit Number 31    Number of Visits 36    Date for PT Re-Evaluation 02/18/22    Authorization Type [employer]    Authorization Time Period from 01/04/22 to 03/04/22    Authorization - Visit Number 7    Authorization - Number of Visits 12   did new hamana auth 01/04/22   Progress Note Due on Visit 54    PT Start Time 1345    PT Stop Time 1430    PT Time Calculation (min) 45 min    Activity Tolerance Patient tolerated treatment well    Behavior During Therapy [facility] for tasks assessed/performed             Past Medical History:  Diagnosis Date   Anxiety    Atrial fibrillation [facility])    sees Dr. Audie Box   Cancer [facility])    breast   Chronic pain    Concussion 02/2007   ICU x 3 days   Depression    Hypertension    Hypothyroidism    Personal history of chemotherapy    Personal history of radiation therapy    Spinal stenosis    Thyroid disease ?1994    Past Surgical History:  Procedure Laterality Date   BREAST BIOPSY Right 12/20/2019   x2   BREAST LUMPECTOMY Right 01/22/2020   BREAST LUMPECTOMY WITH RADIOACTIVE SEED AND SENTINEL LYMPH NODE BIOPSY Right 01/22/2020   Procedure: RIGHT BREAST LUMPECTOMY WITH RADIOACTIVE SEED X2 AND RIGHT SENTINEL LYMPH NODE MAPPING;  Surgeon: Erroll Luna, MD;  Location: [place];  Service: General;  Laterality: Right;   BREAST SURGERY Right 03/1998   breast biopsy, benign   EYE SURGERY Right    PORT-A-CATH REMOVAL Right 04/30/2021   Procedure: REMOVAL PORT-A-CATH;  Surgeon: Erroll Luna, MD;  Location: [place];  Service: General;  Laterality: Right;    PORTACATH PLACEMENT Right 01/22/2020   Procedure: INSERTION PORT-A-CATH WITH ULTRASOUND;  Surgeon: Erroll Luna, MD;  Location: [place];  Service: General;  Laterality: Right;   PORTACATH PLACEMENT Right 02/28/2020   Procedure: PORT A CATH REVISION;  Surgeon: Erroll Luna, MD;  Location: [place];  Service: General;  Laterality: Right;   REVERSE SHOULDER ARTHROPLASTY Left 02/02/2021   Procedure: LEFT REVERSE SHOULDER ARTHROPLASTY;  Surgeon: Meredith Pel, MD;  Location: [place];  Service: Orthopedics;  Laterality: Left;   SHOULDER SURGERY Right    SPINAL FUSION  03/04/2011   with ORIF    There were no vitals filed for this visit.   Subjective Assessment - 02/10/22 1349     Subjective She relays her pain is actually good today and does not have any complaints about this today.    Pertinent History anxiety, breast cancer with lumpectomy and radioactive seed placement 2021 and recent port removal, chronic pain, concussion 2008, HTN, depression, spinal fusion 2012.    Patient Stated Goals improve gait and balance and back pain    Pain Onset More than a month ago    Pain Onset More than a month ago              [facility] Adult  PT Treatment/Exercise - 02/10/22 0001       Ambulation/Gait   Ambulation/Gait Yes    Ambulation/Gait Assistance 5: Supervision    Ambulation Distance (Feet) 300 Feet    Assistive device None    Gait Comments slower velocity and wider BOS, some improvements in gait speed today      Lumbar Exercises: Aerobic   Nustep L7 x 15 min with intervals each 30 sec of high/low intensity effort for functional endurance and leg strength      Lumbar Exercises: Machines for Strengthening   Cybex Knee Extension 25# 3X10    Cybex Knee Flexion 25# 3x10    Leg Press 125# 3X15, SL 75# 2X15 bilat      Lumbar Exercises: Standing   Other Standing Lumbar Exercises Pball rolling up wall into bilat shoulder flexion and thoracic extension 5 sec X10       Lumbar Exercises: Seated   Other Seated Lumbar Exercises --                       PT Short Term Goals - 12/17/21 1438       PT SHORT TERM GOAL #1   Title independent with intial HEP    Baseline met 10/19 but continue to progress    Time 4    Period Weeks    Status Achieved    Target Date 10/08/21      PT SHORT TERM GOAL #2   Title improve TUG to less than 17 seconds    Baseline Met 10/19, will make new goal for this    Time 4    Period Weeks    Status Achieved    Target Date 10/08/21      PT SHORT TERM GOAL #3   Title Pt will improve TUG time to less than 11 seconds without AD to improve gait speed and balance.    Baseline 10.45 on 11/2    Time 4    Period Weeks    Status Achieved    Target Date 11/04/21               PT Long Term Goals - 01/27/22 1457       PT LONG TERM GOAL #1   Title She will improve BERG balance score to 46 or more to show improved balance    Baseline scored 51 today 11/2    Time 12    Period Weeks    Status Achieved    Target Date 12/03/21      PT LONG TERM GOAL #2   Title She will improve TUG score to <10 seconds  to show improved balance and walk speed.    Baseline improved to 11 sec on 1/26    Time 12    Period Weeks    Status Achieved    Target Date 02/18/22      PT LONG TERM GOAL #3   Title report back pain < 3/10 with activity for improved function    Baseline depends, can still get up to 5 at times    Status On-going    Target Date 02/18/22      PT LONG TERM GOAL #4   Title Improve bilat knee/hip strength to 5/5 MMT tested in sitting    Baseline now 4+ for hip and 5 for knee    Time 12    Period Weeks    Status On-going    Target Date 02/18/22      PT  LONG TERM GOAL #5   Title improve FGA to at least 24/30 for improved mobility and balance    Baseline 19/24    Status New    Target Date 02/18/22      PT LONG TERM GOAL #6   Title -      PT LONG TERM GOAL #7   Title -                    Plan - 02/10/22 1452     Clinical Impression Statement Overall she had less pain today and is showing improved leg strength by performing more resistance on leg extension machine. Her gait speed and endurance has improved as a result of this but still with some gait unsteadiness and she will continue to benefit from skilled PT.    Personal Factors and Comorbidities Comorbidity 3+    Comorbidities anxiety, breast cancer with lumpectomy and radioactive seed placement 2021 and recent port removal, chronic pain, concussion 2008, HTN, depression, spinal fusion 2012    Examination-Activity Limitations Locomotion Level;Reach Overhead;Dressing;Hygiene/Grooming;Lift;Toileting;Bathing    Examination-Participation Restrictions Cleaning;Meal Prep;Community Activity;Driving;Laundry    Stability/Clinical Decision Making Evolving/Moderate complexity    Rehab Potential Fair    PT Frequency 2x / week    PT Duration 4 weeks    PT Treatment/Interventions ADLs/Self Care Home Management;Therapeutic exercise;Patient/family education;Cryotherapy;DME Instruction;Ultrasound;Moist Heat;Functional mobility training;Therapeutic activities;Balance training;Neuromuscular re-education;Manual techniques;Vasopneumatic Device;Taping;Dry needling;Passive range of motion;Electrical Stimulation;Gait training    PT Next Visit Plan progress functional strength, and endurance as able.    PT Home Exercise Plan Access Code: [email or web address], added tandem balance and SLS    Consulted and Agree with Plan of Care Patient             Patient will benefit from skilled therapeutic intervention in order to improve the following deficits and impairments:  Postural dysfunction, Decreased range of motion, Decreased knowledge of precautions, Increased edema, Impaired UE functional use, Decreased strength, Decreased mobility, Decreased balance, Pain, Difficulty walking, Decreased activity tolerance, Decreased endurance  Visit  Diagnosis: Other abnormalities of gait and mobility  Chronic bilateral low back pain without sciatica  Acute pain of right knee  Muscle weakness (generalized)  Difficulty in walking, not elsewhere classified     Problem List Patient Active Problem List   Diagnosis Date Noted   Arthritis of left shoulder region    S/P reverse total shoulder arthroplasty, 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   Vitamin D deficiency 01/15/2021   Vitamin B12 deficiency 01/15/2021   Primary insomnia 01/15/2021   Morbid obesity ([place]) 01/15/2021   Moderate recurrent major depression ([place]) 01/15/2021   Benzodiazepine dependence ([place]) 01/15/2021   Port-A-Cath in place 07/22/2020   Left leg cellulitis 04/17/2020   Cellulitis of left leg 04/17/2020   Hypokalemia 04/17/2020   Macrocytic anemia 04/17/2020   Anxiety 04/17/2020   Depression 04/17/2020   Chronic diarrhea 04/17/2020   Chemotherapy induced diarrhea 04/17/2020   Malignant neoplasm of upper-inner quadrant of right breast in female, estrogen receptor positive ([place]) 12/26/2019   Encephalopathy 02/02/2018   Hypothyroidism 02/02/2018   AKI (acute kidney injury) ([place]) 02/02/2018   Chronic back pain 02/02/2018   Alcohol abuse 02/02/2018   Weight gain 02/02/2018   Benign essential HTN 02/02/2018    Debbe Odea, PT,DPT 02/10/2022, 2:55 PM  [facility] Physical Therapy [address] [place], [place], [address] Phone: [phone]   Fax:  [phone]  Name: AIMAN SONN MRN: [id number] Date of Birth: 1951/07/26

## 2022-02-12 ENCOUNTER — Ambulatory Visit (INDEPENDENT_AMBULATORY_CARE_PROVIDER_SITE_OTHER): Payer: Medicare PPO | Admitting: Physical Therapy

## 2022-02-12 ENCOUNTER — Other Ambulatory Visit: Payer: Self-pay

## 2022-02-12 ENCOUNTER — Encounter: Payer: Self-pay | Admitting: Physical Therapy

## 2022-02-12 DIAGNOSIS — M25561 Pain in right knee: Secondary | ICD-10-CM | POA: Diagnosis not present

## 2022-02-12 DIAGNOSIS — R2689 Other abnormalities of gait and mobility: Secondary | ICD-10-CM | POA: Diagnosis not present

## 2022-02-12 DIAGNOSIS — M6281 Muscle weakness (generalized): Secondary | ICD-10-CM | POA: Diagnosis not present

## 2022-02-12 NOTE — Therapy (Signed)
[facility] [address] [place], [place], [address] Phone: [phone]   Fax:  [phone]  Physical Therapy Treatment  Patient Details  Name: [name] MRN: [id number] Date of Birth: [birth year] Referring Provider (PT): Donella Stade, [place]   Encounter Date: 02/12/2022   PT End of Session - 02/12/22 1156     Visit Number 32    Number of Visits 36    Date for PT Re-Evaluation 02/18/22    Authorization Type [employer]    Authorization Time Period from 01/04/22 to 03/04/22    Authorization - Visit Number 8    Authorization - Number of Visits 12   did new hamana auth 01/04/22   Progress Note Due on Visit 36    PT Start Time 1100    PT Stop Time 1145    PT Time Calculation (min) 45 min    Activity Tolerance Patient tolerated treatment well    Behavior During Therapy [facility] for tasks assessed/performed             Past Medical History:  Diagnosis Date   Anxiety    Atrial fibrillation [facility])    sees Dr. Audie Box   Cancer [facility])    breast   Chronic pain    Concussion 02/2007   ICU x 3 days   Depression    Hypertension    Hypothyroidism    Personal history of chemotherapy    Personal history of radiation therapy    Spinal stenosis    Thyroid disease ?1994    Past Surgical History:  Procedure Laterality Date   BREAST BIOPSY Right 12/20/2019   x2   BREAST LUMPECTOMY Right 01/22/2020   BREAST LUMPECTOMY WITH RADIOACTIVE SEED AND SENTINEL LYMPH NODE BIOPSY Right 01/22/2020   Procedure: RIGHT BREAST LUMPECTOMY WITH RADIOACTIVE SEED X2 AND RIGHT SENTINEL LYMPH NODE MAPPING;  Surgeon: Erroll Luna, MD;  Location: [place];  Service: General;  Laterality: Right;   BREAST SURGERY Right 03/1998   breast biopsy, benign   EYE SURGERY Right    PORT-A-CATH REMOVAL Right 04/30/2021   Procedure: REMOVAL PORT-A-CATH;  Surgeon: Erroll Luna, MD;  Location: [place];  Service: General;  Laterality: Right;    PORTACATH PLACEMENT Right 01/22/2020   Procedure: INSERTION PORT-A-CATH WITH ULTRASOUND;  Surgeon: Erroll Luna, MD;  Location: [place];  Service: General;  Laterality: Right;   PORTACATH PLACEMENT Right 02/28/2020   Procedure: PORT A CATH REVISION;  Surgeon: Erroll Luna, MD;  Location: [place];  Service: General;  Laterality: Right;   REVERSE SHOULDER ARTHROPLASTY Left 02/02/2021   Procedure: LEFT REVERSE SHOULDER ARTHROPLASTY;  Surgeon: Meredith Pel, MD;  Location: [place];  Service: Orthopedics;  Laterality: Left;   SHOULDER SURGERY Right    SPINAL FUSION  03/04/2011   with ORIF    There were no vitals filed for this visit.   Subjective Assessment - 02/12/22 1157     Subjective She relays she is still doing well this week with minimal pain overall.    Pertinent History anxiety, breast cancer with lumpectomy and radioactive seed placement 2021 and recent port removal, chronic pain, concussion 2008, HTN, depression, spinal fusion 2012.    Patient Stated Goals improve gait and balance and back pain    Pain Onset More than a month ago    Pain Onset More than a month ago              [facility] PT Treatment/Exercise - 02/12/22  0001       Ambulation/Gait   Ambulation/Gait Yes    Ambulation/Gait Assistance 5: Supervision    Ambulation Distance (Feet) 300 Feet    Assistive device None    Gait Comments slower velocity and wider BOS, mild supervision needed      Lumbar Exercises: Aerobic   Nustep L7 x 15 min with intervals each 30 sec of high/low intensity effort for functional endurance and leg strength      Lumbar Exercises: Machines for Strengthening   Cybex Knee Extension 25# 3X10    Cybex Knee Flexion 25# 3x10    Leg Press 125# 3X15, SL 75# 2X15 bilat      Lumbar Exercises: Standing   Other Standing Lumbar Exercises step ups fwd and lateral on 6 inch step X10 bilat with one UE support      Lumbar Exercises: Seated   Sit to Stand 10 reps     Sit to Stand Limitations holding 5# kettlebell                       PT Short Term Goals - 12/17/21 1438       PT SHORT TERM GOAL #1   Title independent with intial HEP    Baseline met 10/19 but continue to progress    Time 4    Period Weeks    Status Achieved    Target Date 10/08/21      PT SHORT TERM GOAL #2   Title improve TUG to less than 17 seconds    Baseline Met 10/19, will make new goal for this    Time 4    Period Weeks    Status Achieved    Target Date 10/08/21      PT SHORT TERM GOAL #3   Title Pt will improve TUG time to less than 11 seconds without AD to improve gait speed and balance.    Baseline 10.45 on 11/2    Time 4    Period Weeks    Status Achieved    Target Date 11/04/21               PT Long Term Goals - 01/27/22 1457       PT LONG TERM GOAL #1   Title She will improve BERG balance score to 46 or more to show improved balance    Baseline scored 51 today 11/2    Time 12    Period Weeks    Status Achieved    Target Date 12/03/21      PT LONG TERM GOAL #2   Title She will improve TUG score to <10 seconds  to show improved balance and walk speed.    Baseline improved to 11 sec on 1/26    Time 12    Period Weeks    Status Achieved    Target Date 02/18/22      PT LONG TERM GOAL #3   Title report back pain < 3/10 with activity for improved function    Baseline depends, can still get up to 5 at times    Status On-going    Target Date 02/18/22      PT LONG TERM GOAL #4   Title Improve bilat knee/hip strength to 5/5 MMT tested in sitting    Baseline now 4+ for hip and 5 for knee    Time 12    Period Weeks    Status On-going    Target Date 02/18/22  PT LONG TERM GOAL #5   Title improve FGA to at least 24/30 for improved mobility and balance    Baseline 19/24    Status New    Target Date 02/18/22      PT LONG TERM GOAL #6   Title -      PT LONG TERM GOAL #7   Title -                   Plan -  02/12/22 1158     Clinical Impression Statement I progressed her strengthening program and she responded well to this. Overall in the last 2 weeks she has demonstrated improved functioal strength, improved endurance, and improved gait speed. She does still continue to be functionally limted in these and we will work to maximize these with her PT exercises and activities.    Personal Factors and Comorbidities Comorbidity 3+    Comorbidities anxiety, breast cancer with lumpectomy and radioactive seed placement 2021 and recent port removal, chronic pain, concussion 2008, HTN, depression, spinal fusion 2012    Examination-Activity Limitations Locomotion Level;Reach Overhead;Dressing;Hygiene/Grooming;Lift;Toileting;Bathing    Examination-Participation Restrictions Cleaning;Meal Prep;Community Activity;Driving;Laundry    Stability/Clinical Decision Making Evolving/Moderate complexity    Rehab Potential Fair    PT Frequency 2x / week    PT Duration 4 weeks    PT Treatment/Interventions ADLs/Self Care Home Management;Therapeutic exercise;Patient/family education;Cryotherapy;DME Instruction;Ultrasound;Moist Heat;Functional mobility training;Therapeutic activities;Balance training;Neuromuscular re-education;Manual techniques;Vasopneumatic Device;Taping;Dry needling;Passive range of motion;Electrical Stimulation;Gait training    PT Next Visit Plan progress functional strength, and endurance as able.    PT Home Exercise Plan Access Code: [email or web address], added tandem balance and SLS    Consulted and Agree with Plan of Care Patient             Patient will benefit from skilled therapeutic intervention in order to improve the following deficits and impairments:  Postural dysfunction, Decreased range of motion, Decreased knowledge of precautions, Increased edema, Impaired UE functional use, Decreased strength, Decreased mobility, Decreased balance, Pain, Difficulty walking, Decreased activity tolerance, Decreased  endurance  Visit Diagnosis: Other abnormalities of gait and mobility  Acute pain of right knee  Muscle weakness (generalized)     Problem List Patient Active Problem List   Diagnosis Date Noted   Arthritis of left shoulder region    S/P reverse total shoulder arthroplasty, 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   Vitamin D deficiency 01/15/2021   Vitamin B12 deficiency 01/15/2021   Primary insomnia 01/15/2021   Morbid obesity ([place]) 01/15/2021   Moderate recurrent major depression ([place]) 01/15/2021   Benzodiazepine dependence ([place]) 01/15/2021   Port-A-Cath in place 07/22/2020   Left leg cellulitis 04/17/2020   Cellulitis of left leg 04/17/2020   Hypokalemia 04/17/2020   Macrocytic anemia 04/17/2020   Anxiety 04/17/2020   Depression 04/17/2020   Chronic diarrhea 04/17/2020   Chemotherapy induced diarrhea 04/17/2020   Malignant neoplasm of upper-inner quadrant of right breast in female, estrogen receptor positive ([place]) 12/26/2019   Encephalopathy 02/02/2018   Hypothyroidism 02/02/2018   AKI (acute kidney injury) ([place]) 02/02/2018   Chronic back pain 02/02/2018   Alcohol abuse 02/02/2018   Weight gain 02/02/2018   Benign essential HTN 02/02/2018    Debbe Odea, PT,DPT 02/12/2022, 12:00 PM  [facility] : [facility] [address] [place], [place], [address] Phone: [phone]   Fax:  [phone]  Name: [name] MRN: [id number] Date of Birth: 1951-10-11

## 2022-02-16 ENCOUNTER — Encounter: Payer: Self-pay | Admitting: Physical Therapy

## 2022-02-16 ENCOUNTER — Ambulatory Visit (INDEPENDENT_AMBULATORY_CARE_PROVIDER_SITE_OTHER): Payer: Medicare PPO | Admitting: Physical Therapy

## 2022-02-16 ENCOUNTER — Other Ambulatory Visit: Payer: Self-pay

## 2022-02-16 DIAGNOSIS — R2689 Other abnormalities of gait and mobility: Secondary | ICD-10-CM | POA: Diagnosis not present

## 2022-02-16 DIAGNOSIS — M25561 Pain in right knee: Secondary | ICD-10-CM | POA: Diagnosis not present

## 2022-02-16 DIAGNOSIS — M545 Low back pain, unspecified: Secondary | ICD-10-CM

## 2022-02-16 DIAGNOSIS — R262 Difficulty in walking, not elsewhere classified: Secondary | ICD-10-CM

## 2022-02-16 DIAGNOSIS — G8929 Other chronic pain: Secondary | ICD-10-CM

## 2022-02-16 DIAGNOSIS — M6281 Muscle weakness (generalized): Secondary | ICD-10-CM

## 2022-02-16 NOTE — Therapy (Signed)
[facility] [address] [place], [place], [address] Phone: [phone]   Fax:  [phone]  Physical Therapy Treatment  Patient Details  Name: [name] MRN: [id number] Date of Birth: [birth year] Referring Provider (PT): Donella Stade, [place]   Encounter Date: 02/16/2022   PT End of Session - 02/16/22 1612     Visit Number 33    Number of Visits 36    Date for PT Re-Evaluation 02/18/22    Authorization Type [employer]    Authorization Time Period from 01/04/22 to 03/04/22    Authorization - Visit Number 9    Authorization - Number of Visits 12   did new ham[employer] auth 01/04/22   Progress Note Due on Visit 26    PT Start Time 1430    PT Stop Time 1515    PT Time Calculation (min) 45 min    Activity Tolerance Patient tolerated treatment well    Behavior During Therapy [facility] for tasks assessed/performed             Past Medical History:  Diagnosis Date   Anxiety    Atrial fibrillation [facility])    sees Dr. Audie Box   Cancer [facility])    breast   Chronic pain    Concussion 02/2007   ICU x 3 days   Depression    Hypertension    Hypothyroidism    Personal history of chemotherapy    Personal history of radiation therapy    Spinal stenosis    Thyroid disease ?1994    Past Surgical History:  Procedure Laterality Date   BREAST BIOPSY Right 12/20/2019   x2   BREAST LUMPECTOMY Right 01/22/2020   BREAST LUMPECTOMY WITH RADIOACTIVE SEED AND SENTINEL LYMPH NODE BIOPSY Right 01/22/2020   Procedure: RIGHT BREAST LUMPECTOMY WITH RADIOACTIVE SEED X2 AND RIGHT SENTINEL LYMPH NODE MAPPING;  Surgeon: Erroll Luna, MD;  Location: [place];  Service: General;  Laterality: Right;   BREAST SURGERY Right 03/1998   breast biopsy, benign   EYE SURGERY Right    PORT-A-CATH REMOVAL Right 04/30/2021   Procedure: REMOVAL PORT-A-CATH;  Surgeon: Erroll Luna, MD;  Location: [place];  Service: General;  Laterality: Right;    PORTACATH PLACEMENT Right 01/22/2020   Procedure: INSERTION PORT-A-CATH WITH ULTRASOUND;  Surgeon: Erroll Luna, MD;  Location: [place];  Service: General;  Laterality: Right;   PORTACATH PLACEMENT Right 02/28/2020   Procedure: PORT A CATH REVISION;  Surgeon: Erroll Luna, MD;  Location: [place];  Service: General;  Laterality: Right;   REVERSE SHOULDER ARTHROPLASTY Left 02/02/2021   Procedure: LEFT REVERSE SHOULDER ARTHROPLASTY;  Surgeon: Meredith Pel, MD;  Location: [place];  Service: Orthopedics;  Laterality: Left;   SHOULDER SURGERY Right    SPINAL FUSION  03/04/2011   with ORIF    There were no vitals filed for this visit.   Subjective Assessment - 02/16/22 1649     Subjective she relays pain is overall 3/10 today for her low back.    Pertinent History anxiety, breast cancer with lumpectomy and radioactive seed placement 2021 and recent port removal, chronic pain, concussion 2008, HTN, depression, spinal fusion 2012.    Patient Stated Goals improve gait and balance and back pain    Pain Onset More than a month ago    Pain Onset More than a month ago                 PT Short Term Goals -  12/17/21 1438       PT SHORT TERM GOAL #1   Title independent with intial HEP    Baseline met 10/19 but continue to progress    Time 4    Period Weeks    Status Achieved    Target Date 10/08/21      PT SHORT TERM GOAL #2   Title improve TUG to less than 17 seconds    Baseline Met 10/19, will make new goal for this    Time 4    Period Weeks    Status Achieved    Target Date 10/08/21      PT SHORT TERM GOAL #3   Title Pt will improve TUG time to less than 11 seconds without AD to improve gait speed and balance.    Baseline 10.45 on 11/2    Time 4    Period Weeks    Status Achieved    Target Date 11/04/21               PT Long Term Goals - 01/27/22 1457       PT LONG TERM GOAL #1   Title She will improve BERG balance score to 46 or  more to show improved balance    Baseline scored 51 today 11/2    Time 12    Period Weeks    Status Achieved    Target Date 12/03/21      PT LONG TERM GOAL #2   Title She will improve TUG score to <10 seconds  to show improved balance and walk speed.    Baseline improved to 11 sec on 1/26    Time 12    Period Weeks    Status Achieved    Target Date 02/18/22      PT LONG TERM GOAL #3   Title report back pain < 3/10 with activity for improved function    Baseline depends, can still get up to 5 at times    Status On-going    Target Date 02/18/22      PT LONG TERM GOAL #4   Title Improve bilat knee/hip strength to 5/5 MMT tested in sitting    Baseline now 4+ for hip and 5 for knee    Time 12    Period Weeks    Status On-going    Target Date 02/18/22      PT LONG TERM GOAL #5   Title improve FGA to at least 24/30 for improved mobility and balance    Baseline 19/24    Status New    Target Date 02/18/22      PT LONG TERM GOAL #6   Title -      PT LONG TERM GOAL #7   Title -                   Plan - 02/16/22 1649     Clinical Impression Statement we continued to work to improve her overall leg strenght and fucntional endurance to allow improved abilities for ADL's. She does feel she is making some progress in these areas. She will continue to benefit from skilled PT.    Personal Factors and Comorbidities Comorbidity 3+    Comorbidities anxiety, breast cancer with lumpectomy and radioactive seed placement 2021 and recent port removal, chronic pain, concussion 2008, HTN, depression, spinal fusion 2012    Examination-Activity Limitations Locomotion Level;Reach Overhead;Dressing;Hygiene/Grooming;Lift;Toileting;Bathing    Examination-Participation Restrictions Cleaning;Meal Prep;Community Activity;Driving;Laundry    Stability/Clinical Decision Making Evolving/Moderate complexity  Rehab Potential Fair    PT Frequency 2x / week    PT Duration 4 weeks    PT  Treatment/Interventions ADLs/Self Care Home Management;Therapeutic exercise;Patient/family education;Cryotherapy;DME Instruction;Ultrasound;Moist Heat;Functional mobility training;Therapeutic activities;Balance training;Neuromuscular re-education;Manual techniques;Vasopneumatic Device;Taping;Dry needling;Passive range of motion;Electrical Stimulation;Gait training    PT Next Visit Plan progress note needed.    PT Home Exercise Plan Access Code: [email or web address], added tandem balance and SLS    Consulted and Agree with Plan of Care Patient             Patient will benefit from skilled therapeutic intervention in order to improve the following deficits and impairments:  Postural dysfunction, Decreased range of motion, Decreased knowledge of precautions, Increased edema, Impaired UE functional use, Decreased strength, Decreased mobility, Decreased balance, Pain, Difficulty walking, Decreased activity tolerance, Decreased endurance  Visit Diagnosis: Other abnormalities of gait and mobility  Acute pain of right knee  Muscle weakness (generalized)  Chronic bilateral low back pain without sciatica  Difficulty in walking, not elsewhere classified     Problem List Patient Active Problem List   Diagnosis Date Noted   Arthritis of left shoulder region    S/P reverse total shoulder arthroplasty, 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   Vitamin D deficiency 01/15/2021   Vitamin B12 deficiency 01/15/2021   Primary insomnia 01/15/2021   Morbid obesity ([place]) 01/15/2021   Moderate recurrent major depression ([place]) 01/15/2021   Benzodiazepine dependence ([place]) 01/15/2021   Port-A-Cath in place 07/22/2020   Left leg cellulitis 04/17/2020   Cellulitis of left leg 04/17/2020   Hypokalemia 04/17/2020   Macrocytic anemia 04/17/2020   Anxiety 04/17/2020   Depression 04/17/2020   Chronic diarrhea 04/17/2020   Chemotherapy induced diarrhea 04/17/2020   Malignant neoplasm of upper-inner quadrant of right breast in female,  estrogen receptor positive ([place]) 12/26/2019   Encephalopathy 02/02/2018   Hypothyroidism 02/02/2018   AKI (acute kidney injury) ([place]) 02/02/2018   Chronic back pain 02/02/2018   Alcohol abuse 02/02/2018   Weight gain 02/02/2018   Benign essential HTN 02/02/2018    Debbe Odea, PT 02/16/2022, 4:50 PM  [facility] Physical Therapy [address] [place], [place], [address] Phone: [phone]   Fax:  [phone]  Name: [name] MRN: [id number] Date of Birth: 1951/06/20

## 2022-02-18 ENCOUNTER — Ambulatory Visit (INDEPENDENT_AMBULATORY_CARE_PROVIDER_SITE_OTHER): Payer: Medicare PPO | Admitting: Physical Therapy

## 2022-02-18 ENCOUNTER — Encounter: Payer: Self-pay | Admitting: Physical Therapy

## 2022-02-18 ENCOUNTER — Other Ambulatory Visit: Payer: Self-pay

## 2022-02-18 DIAGNOSIS — R262 Difficulty in walking, not elsewhere classified: Secondary | ICD-10-CM | POA: Diagnosis not present

## 2022-02-18 DIAGNOSIS — G8929 Other chronic pain: Secondary | ICD-10-CM | POA: Diagnosis not present

## 2022-02-18 DIAGNOSIS — M545 Low back pain, unspecified: Secondary | ICD-10-CM | POA: Diagnosis not present

## 2022-02-18 DIAGNOSIS — M6281 Muscle weakness (generalized): Secondary | ICD-10-CM | POA: Diagnosis not present

## 2022-02-18 DIAGNOSIS — R2689 Other abnormalities of gait and mobility: Secondary | ICD-10-CM

## 2022-02-18 DIAGNOSIS — M25561 Pain in right knee: Secondary | ICD-10-CM | POA: Diagnosis not present

## 2022-02-18 NOTE — Therapy (Signed)
[facility] [address] [place], [place], [address] Phone: [phone]   Fax:  [phone]  Physical Therapy Treatment  Patient Details  Name: [name] MRN: [id number] Date of Birth: 1951/08/10 Referring Provider (PT): Donella Stade, [place]   Encounter Date: 02/18/2022   PT End of Session - 02/18/22 1307     Visit Number 34    Number of Visits 36    Date for PT Re-Evaluation 02/18/22    Authorization Type [employer]    Authorization Time Period from 01/04/22 to 03/04/22    Authorization - Visit Number 10    Authorization - Number of Visits 12   did new hamana auth 01/04/22   Progress Note Due on Visit 54    PT Start Time 1300    PT Stop Time 1345    PT Time Calculation (min) 45 min    Activity Tolerance Patient tolerated treatment well    Behavior During Therapy [facility] for tasks assessed/performed             Past Medical History:  Diagnosis Date   Anxiety    Atrial fibrillation [facility])    sees Dr. Audie Box   Cancer [facility])    breast   Chronic pain    Concussion 02/2007   ICU x 3 days   Depression    Hypertension    Hypothyroidism    Personal history of chemotherapy    Personal history of radiation therapy    Spinal stenosis    Thyroid disease ?1994    Past Surgical History:  Procedure Laterality Date   BREAST BIOPSY Right 12/20/2019   x2   BREAST LUMPECTOMY Right 01/22/2020   BREAST LUMPECTOMY WITH RADIOACTIVE SEED AND SENTINEL LYMPH NODE BIOPSY Right 01/22/2020   Procedure: RIGHT BREAST LUMPECTOMY WITH RADIOACTIVE SEED X2 AND RIGHT SENTINEL LYMPH NODE MAPPING;  Surgeon: Erroll Luna, MD;  Location: [place];  Service: General;  Laterality: Right;   BREAST SURGERY Right 03/1998   breast biopsy, benign   EYE SURGERY Right    PORT-A-CATH REMOVAL Right 04/30/2021   Procedure: REMOVAL PORT-A-CATH;  Surgeon: Erroll Luna, MD;  Location: [place];  Service: General;  Laterality: Right;    PORTACATH PLACEMENT Right 01/22/2020   Procedure: INSERTION PORT-A-CATH WITH ULTRASOUND;  Surgeon: Erroll Luna, MD;  Location: [place];  Service: General;  Laterality: Right;   PORTACATH PLACEMENT Right 02/28/2020   Procedure: PORT A CATH REVISION;  Surgeon: Erroll Luna, MD;  Location: [place];  Service: General;  Laterality: Right;   REVERSE SHOULDER ARTHROPLASTY Left 02/02/2021   Procedure: LEFT REVERSE SHOULDER ARTHROPLASTY;  Surgeon: Meredith Pel, MD;  Location: [place];  Service: Orthopedics;  Laterality: Left;   SHOULDER SURGERY Right    SPINAL FUSION  03/04/2011   with ORIF    There were no vitals filed for this visit.   Subjective Assessment - 02/18/22 1308     Subjective she relays pain is overall still doing pretty well and at 3/10 today for her low back.    Pertinent History anxiety, breast cancer with lumpectomy and radioactive seed placement 2021 and recent port removal, chronic pain, concussion 2008, HTN, depression, spinal fusion 2012.    Patient Stated Goals improve gait and balance and back pain    Pain Onset More than a month ago    Pain Onset More than a month ago  [facility] Adult PT Treatment/Exercise - 02/18/22 0001       Ambulation/Gait   Ambulation/Gait Yes    Ambulation/Gait Assistance 5: Supervision    Ambulation Distance (Feet) 450 Feet    Assistive device None    Gait Comments slower velocity and wider BOS, now only minimal supervision needed      Lumbar Exercises: Aerobic   Nustep L7 x 15 min with intervals each 30 sec of high/low intensity effort for functional endurance and leg strength      Lumbar Exercises: Machines for Strengthening   Cybex Knee Extension 25# 3X10    Cybex Knee Flexion 25# 2x15    Leg Press 125# 3X15, SL 75# 2X15 bilat      Lumbar Exercises: Standing   Other Standing Lumbar Exercises step ups fwd and lateral on 6 inch step X10 bilat with one UE  support      Lumbar Exercises: Seated   Sit to Stand Limitations X5 holding 10# kettlebell with lift into bilat shoulder flexion, then X 10 with 5# KB same thing                       PT Short Term Goals - 12/17/21 1438       PT SHORT TERM GOAL #1   Title independent with intial HEP    Baseline met 10/19 but continue to progress    Time 4    Period Weeks    Status Achieved    Target Date 10/08/21      PT SHORT TERM GOAL #2   Title improve TUG to less than 17 seconds    Baseline Met 10/19, will make new goal for this    Time 4    Period Weeks    Status Achieved    Target Date 10/08/21      PT SHORT TERM GOAL #3   Title Pt will improve TUG time to less than 11 seconds without AD to improve gait speed and balance.    Baseline 10.45 on 11/2    Time 4    Period Weeks    Status Achieved    Target Date 11/04/21               PT Long Term Goals - 01/27/22 1457       PT LONG TERM GOAL #1   Title She will improve BERG balance score to 46 or more to show improved balance    Baseline scored 51 today 11/2    Time 12    Period Weeks    Status Achieved    Target Date 12/03/21      PT LONG TERM GOAL #2   Title She will improve TUG score to <10 seconds  to show improved balance and walk speed.    Baseline improved to 11 sec on 1/26    Time 12    Period Weeks    Status Achieved    Target Date 02/18/22      PT LONG TERM GOAL #3   Title report back pain < 3/10 with activity for improved function    Baseline depends, can still get up to 5 at times    Status On-going    Target Date 02/18/22      PT LONG TERM GOAL #4   Title Improve bilat knee/hip strength to 5/5 MMT tested in sitting    Baseline now 4+ for hip and 5 for knee    Time 12  Period Weeks    Status On-going    Target Date 02/18/22      PT LONG TERM GOAL #5   Title improve FGA to at least 24/30 for improved mobility and balance    Baseline 19/24    Status New    Target Date 02/18/22       PT LONG TERM GOAL #6   Title -      PT LONG TERM GOAL #7   Title -                   Plan - 02/18/22 1337     Clinical Impression Statement overall strength and activity tolerance do appear to be progressing with PT and she has had less overall pain over the last 3 visits. PT recommending to continue with current POC to maximize her funcitonal abilities.    Personal Factors and Comorbidities Comorbidity 3+    Comorbidities anxiety, breast cancer with lumpectomy and radioactive seed placement 2021 and recent port removal, chronic pain, concussion 2008, HTN, depression, spinal fusion 2012    Examination-Activity Limitations Locomotion Level;Reach Overhead;Dressing;Hygiene/Grooming;Lift;Toileting;Bathing    Examination-Participation Restrictions Cleaning;Meal Prep;Community Activity;Driving;Laundry    Stability/Clinical Decision Making Evolving/Moderate complexity    Rehab Potential Fair    PT Frequency 2x / week    PT Duration 4 weeks    PT Treatment/Interventions ADLs/Self Care Home Management;Therapeutic exercise;Patient/family education;Cryotherapy;DME Instruction;Ultrasound;Moist Heat;Functional mobility training;Therapeutic activities;Balance training;Neuromuscular re-education;Manual techniques;Vasopneumatic Device;Taping;Dry needling;Passive range of motion;Electrical Stimulation;Gait training    PT Next Visit Plan continue to progress function with strength, endurance activiites    PT Home Exercise Plan Access Code: [email or web address], added tandem balance and SLS    Consulted and Agree with Plan of Care Patient             Patient will benefit from skilled therapeutic intervention in order to improve the following deficits and impairments:  Postural dysfunction, Decreased range of motion, Decreased knowledge of precautions, Increased edema, Impaired UE functional use, Decreased strength, Decreased mobility, Decreased balance, Pain, Difficulty walking, Decreased activity  tolerance, Decreased endurance  Visit Diagnosis: Other abnormalities of gait and mobility  Acute pain of right knee  Muscle weakness (generalized)  Chronic bilateral low back pain without sciatica  Difficulty in walking, not elsewhere classified     Problem List Patient Active Problem List   Diagnosis Date Noted   Arthritis of left shoulder region    S/P reverse total shoulder arthroplasty, 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   Vitamin D deficiency 01/15/2021   Vitamin B12 deficiency 01/15/2021   Primary insomnia 01/15/2021   Morbid obesity ([place]) 01/15/2021   Moderate recurrent major depression ([place]) 01/15/2021   Benzodiazepine dependence ([place]) 01/15/2021   Port-A-Cath in place 07/22/2020   Left leg cellulitis 04/17/2020   Cellulitis of left leg 04/17/2020   Hypokalemia 04/17/2020   Macrocytic anemia 04/17/2020   Anxiety 04/17/2020   Depression 04/17/2020   Chronic diarrhea 04/17/2020   Chemotherapy induced diarrhea 04/17/2020   Malignant neoplasm of upper-inner quadrant of right breast in female, estrogen receptor positive ([place]) 12/26/2019   Encephalopathy 02/02/2018   Hypothyroidism 02/02/2018   AKI (acute kidney injury) ([place]) 02/02/2018   Chronic back pain 02/02/2018   Alcohol abuse 02/02/2018   Weight gain 02/02/2018   Benign essential HTN 02/02/2018    Debbe Odea, PT,DPT 02/18/2022, 2:46 PM  [facility] [address] [place], [place], [address] Phone: [phone]   Fax:  [phone]  Name: [name] MRN: [id number] Date of Birth: 10-01-1951

## 2022-02-23 ENCOUNTER — Other Ambulatory Visit: Payer: Self-pay

## 2022-02-23 ENCOUNTER — Ambulatory Visit (INDEPENDENT_AMBULATORY_CARE_PROVIDER_SITE_OTHER): Payer: Medicare PPO | Admitting: Physical Therapy

## 2022-02-23 ENCOUNTER — Encounter: Payer: Self-pay | Admitting: Physical Therapy

## 2022-02-23 DIAGNOSIS — M545 Low back pain, unspecified: Secondary | ICD-10-CM

## 2022-02-23 DIAGNOSIS — M25561 Pain in right knee: Secondary | ICD-10-CM

## 2022-02-23 DIAGNOSIS — R2689 Other abnormalities of gait and mobility: Secondary | ICD-10-CM | POA: Diagnosis not present

## 2022-02-23 DIAGNOSIS — G8929 Other chronic pain: Secondary | ICD-10-CM

## 2022-02-23 DIAGNOSIS — M6281 Muscle weakness (generalized): Secondary | ICD-10-CM | POA: Diagnosis not present

## 2022-02-23 DIAGNOSIS — R262 Difficulty in walking, not elsewhere classified: Secondary | ICD-10-CM

## 2022-02-23 NOTE — Therapy (Signed)
[facility] [address] [place], [place], [address] Phone: [phone]   Fax:  [phone]  Physical Therapy Treatment/Recert  Patient Details  Name: [name] MRN: [id number] Date of Birth: 22-Nov-1951 Referring Provider (PT): Donella Stade, [place]   Encounter Date: 02/23/2022   PT End of Session - 02/23/22 1413     Visit Number 35    Number of Visits 26    Date for PT Re-Evaluation 04/20/22    Authorization Type [employer]    Authorization Time Period from 01/04/22 to 03/04/22, will need new submisison next visit due to visit #    Authorization - Visit Number 11    Authorization - Number of Visits 12   did new hamana auth 01/04/22   Progress Note Due on Visit 72    PT Start Time 1345    PT Stop Time 1430    PT Time Calculation (min) 45 min    Activity Tolerance Patient tolerated treatment well    Behavior During Therapy [facility] for tasks assessed/performed             Past Medical History:  Diagnosis Date   Anxiety    Atrial fibrillation [facility])    sees Dr. Audie Box   Cancer [facility])    breast   Chronic pain    Concussion 02/2007   ICU x 3 days   Depression    Hypertension    Hypothyroidism    Personal history of chemotherapy    Personal history of radiation therapy    Spinal stenosis    Thyroid disease ?1994    Past Surgical History:  Procedure Laterality Date   BREAST BIOPSY Right 12/20/2019   x2   BREAST LUMPECTOMY Right 01/22/2020   BREAST LUMPECTOMY WITH RADIOACTIVE SEED AND SENTINEL LYMPH NODE BIOPSY Right 01/22/2020   Procedure: RIGHT BREAST LUMPECTOMY WITH RADIOACTIVE SEED X2 AND RIGHT SENTINEL LYMPH NODE MAPPING;  Surgeon: Erroll Luna, MD;  Location: [place];  Service: General;  Laterality: Right;   BREAST SURGERY Right 03/1998   breast biopsy, benign   EYE SURGERY Right    PORT-A-CATH REMOVAL Right 04/30/2021   Procedure: REMOVAL PORT-A-CATH;  Surgeon: Erroll Luna, MD;  Location: [place];  Service: General;  Laterality: Right;   PORTACATH PLACEMENT Right 01/22/2020   Procedure: INSERTION PORT-A-CATH WITH ULTRASOUND;  Surgeon: Erroll Luna, MD;  Location: [place];  Service: General;  Laterality: Right;   PORTACATH PLACEMENT Right 02/28/2020   Procedure: PORT A CATH REVISION;  Surgeon: Erroll Luna, MD;  Location: [place];  Service: General;  Laterality: Right;   REVERSE SHOULDER ARTHROPLASTY Left 02/02/2021   Procedure: LEFT REVERSE SHOULDER ARTHROPLASTY;  Surgeon: Meredith Pel, MD;  Location: [place];  Service: Orthopedics;  Laterality: Left;   SHOULDER SURGERY Right    SPINAL FUSION  03/04/2011   with ORIF    There were no vitals filed for this visit.   Subjective Assessment - 02/23/22 1417     Subjective She relays she feels very weak today as she has been sick and did not sleep well last night. Pain is doing okay today. She still feel she needs more PT to continue to work on her walking, balance, and endurance.    Pertinent History anxiety, breast cancer with lumpectomy and radioactive seed placement 2021 and recent port removal, chronic pain, concussion 2008, HTN, depression, spinal fusion 2012.    Patient Stated Goals improve gait and balance and back pain  Pain Onset More than a month ago    Pain Onset More than a month ago              [facility] Adult PT Treatment/Exercise - 02/23/22 0001       Ambulation/Gait   Ambulation/Gait Yes    Ambulation/Gait Assistance 5: Supervision    Ambulation Distance (Feet) 150 Feet    Assistive device None    Gait Comments slower velocity and wider BOS, now only minimal supervision needed      Lumbar Exercises: Aerobic   Nustep L7 x 15 min with intervals each 30 sec of high/low intensity effort for functional endurance and leg strength      Lumbar Exercises: Machines for Strengthening   Cybex Knee Extension 25# 3X10    Cybex Knee Flexion 25# 2x15    Leg Press 100# 3X15, SL  75# 2X15 bilat      Lumbar Exercises: Standing   Other Standing Lumbar Exercises step ups fwd and lateral on 6 inch step X10 bilat with one UE support                       PT Short Term Goals - 12/17/21 1438       PT SHORT TERM GOAL #1   Title independent with intial HEP    Baseline met 10/19 but continue to progress    Time 4    Period Weeks    Status Achieved    Target Date 10/08/21      PT SHORT TERM GOAL #2   Title improve TUG to less than 17 seconds    Baseline Met 10/19, will make new goal for this    Time 4    Period Weeks    Status Achieved    Target Date 10/08/21      PT SHORT TERM GOAL #3   Title Pt will improve TUG time to less than 11 seconds without AD to improve gait speed and balance.    Baseline 10.45 on 11/2    Time 4    Period Weeks    Status Achieved    Target Date 11/04/21               PT Long Term Goals - 01/27/22 1457       PT LONG TERM GOAL #1   Title She will improve BERG balance score to 46 or more to show improved balance    Baseline scored 51 today 11/2    Time 12    Period Weeks    Status Achieved    Target Date 12/03/21      PT LONG TERM GOAL #2   Title She will improve TUG score to <10 seconds  to show improved balance and walk speed.    Baseline improved to 11 sec on 1/26    Time 12    Period Weeks    Status Achieved    Target Date 02/18/22      PT LONG TERM GOAL #3   Title report back pain < 3/10 with activity for improved function    Baseline depends, can still get up to 5 at times    Status On-going    Target Date 02/18/22      PT LONG TERM GOAL #4   Title Improve bilat knee/hip strength to 5/5 MMT tested in sitting    Baseline now 4+ for hip and 5 for knee    Time 12    Period Weeks  Status On-going    Target Date 02/18/22      PT LONG TERM GOAL #5   Title improve FGA to at least 24/30 for improved mobility and balance    Baseline 19/24    Status New    Target Date 02/18/22      PT LONG  TERM GOAL #6   Title -      PT LONG TERM GOAL #7   Title -                   Plan - 02/23/22 1430     Clinical Impression Statement She was overall more fatigued today so we had to back down slightly on some of her activity today. Recert was sent to MD due to POC date being up. I feel that PT remains medically  necessary for up to 8 more weeks to progress her functional strenth and endurance and reduce her risk of falling. I will update measurements and goals next visit when new humana authorizaiton is required. I did not want to retest today due to her fatigue levels of being sick.    Personal Factors and Comorbidities Comorbidity 3+    Comorbidities anxiety, breast cancer with lumpectomy and radioactive seed placement 2021 and recent port removal, chronic pain, concussion 2008, HTN, depression, spinal fusion 2012    Examination-Activity Limitations Locomotion Level;Reach Overhead;Dressing;Hygiene/Grooming;Lift;Toileting;Bathing    Examination-Participation Restrictions Cleaning;Meal Prep;Community Activity;Driving;Laundry    Stability/Clinical Decision Making Evolving/Moderate complexity    Rehab Potential Fair    PT Frequency 2x / week    PT Duration 4 weeks    PT Treatment/Interventions ADLs/Self Care Home Management;Therapeutic exercise;Patient/family education;Cryotherapy;DME Instruction;Ultrasound;Moist Heat;Functional mobility training;Therapeutic activities;Balance training;Neuromuscular re-education;Manual techniques;Vasopneumatic Device;Taping;Dry needling;Passive range of motion;Electrical Stimulation;Gait training    PT Next Visit Plan new humana auth.    PT Home Exercise Plan Access Code: [email or web address], added tandem balance and SLS    Consulted and Agree with Plan of Care Patient             Patient will benefit from skilled therapeutic intervention in order to improve the following deficits and impairments:  Postural dysfunction, Decreased range of motion, Decreased  knowledge of precautions, Increased edema, Impaired UE functional use, Decreased strength, Decreased mobility, Decreased balance, Pain, Difficulty walking, Decreased activity tolerance, Decreased endurance  Visit Diagnosis: Other abnormalities of gait and mobility  Acute pain of right knee  Muscle weakness (generalized)  Chronic bilateral low back pain without sciatica  Difficulty in walking, not elsewhere classified     Problem List Patient Active Problem List   Diagnosis Date Noted   Arthritis of left shoulder region    S/P reverse total shoulder arthroplasty, 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   Vitamin D deficiency 01/15/2021   Vitamin B12 deficiency 01/15/2021   Primary insomnia 01/15/2021   Morbid obesity ([place]) 01/15/2021   Moderate recurrent major depression ([place]) 01/15/2021   Benzodiazepine dependence ([place]) 01/15/2021   Port-A-Cath in place 07/22/2020   Left leg cellulitis 04/17/2020   Cellulitis of left leg 04/17/2020   Hypokalemia 04/17/2020   Macrocytic anemia 04/17/2020   Anxiety 04/17/2020   Depression 04/17/2020   Chronic diarrhea 04/17/2020   Chemotherapy induced diarrhea 04/17/2020   Malignant neoplasm of upper-inner quadrant of right breast in female, estrogen receptor positive ([place]) 12/26/2019   Encephalopathy 02/02/2018   Hypothyroidism 02/02/2018   AKI (acute kidney injury) ([place]) 02/02/2018   Chronic back pain 02/02/2018   Alcohol abuse 02/02/2018   Weight gain 02/02/2018   Benign essential HTN 02/02/2018  Debbe Odea, PT,DPT 02/23/2022, 3:19 PM  [facility] [address] [place], [place], [address] Phone: [phone]   Fax:  [phone]  Name: [name] MRN: [id number] Date of Birth: 07-Sep-1951

## 2022-02-25 ENCOUNTER — Other Ambulatory Visit: Payer: Self-pay

## 2022-02-25 ENCOUNTER — Ambulatory Visit (INDEPENDENT_AMBULATORY_CARE_PROVIDER_SITE_OTHER): Payer: Medicare PPO | Admitting: Physical Therapy

## 2022-02-25 ENCOUNTER — Encounter: Payer: Self-pay | Admitting: Physical Therapy

## 2022-02-25 DIAGNOSIS — M25561 Pain in right knee: Secondary | ICD-10-CM

## 2022-02-25 DIAGNOSIS — M545 Low back pain, unspecified: Secondary | ICD-10-CM | POA: Diagnosis not present

## 2022-02-25 DIAGNOSIS — R2689 Other abnormalities of gait and mobility: Secondary | ICD-10-CM | POA: Diagnosis not present

## 2022-02-25 DIAGNOSIS — R262 Difficulty in walking, not elsewhere classified: Secondary | ICD-10-CM

## 2022-02-25 DIAGNOSIS — M6281 Muscle weakness (generalized): Secondary | ICD-10-CM

## 2022-02-25 DIAGNOSIS — G8929 Other chronic pain: Secondary | ICD-10-CM

## 2022-02-25 NOTE — Therapy (Signed)
[facility] Physical Therapy [address] [place], [place], [address] Phone: [phone]   Fax:  [phone]  Physical Therapy Treatment/Progress note/Hum[employer] reauth Referring diagnosis? M54.50 Treatment diagnosis? (if different than referring diagnosis) R26.89 What was this (referring dx) caused by? _0  Surgery _1  Fall _2  Ongoing issue _3  Arthritis _4  Other: ____________   Laterality: _5  Rt _6  Lt _7  Both   Check all possible CPT codes:              *CHOOSE 10 OR LESS*                            _8  97110 (Therapeutic Exercise)              _9  92507 (SLP Treatment)           _10  76546 (Neuro Re-ed)                            _11  50354 (Swallowing Treatment)                     _12  65681 (Gait Training)                            _13  27517 (Cognitive Training, 1st 15 minutes) _14  97140 (Manual Therapy)                      _15  97130 (Cognitive Training, each add'l 15 minutes)     _16  97530 (Therapeutic Activities)             _17  Other, List CPT Code ____________              _18  00174 (Self Care)                                                       _19  All codes above (97110 - 97535)           _20  97012 (Mechanical Traction)           _21  97014 (E-stim Unattended)           _22  97032 (E-stim manual)           _23  97033 (Ionto)           _24  97035 (Ultrasound)           _25  97760 (Orthotic Fit) _26  97750 (Physical Performance Training) _27  [id number] (Aquatic Therapy) _28  94496 (Contrast Bath) _29  75916 (Paraffin) _30  97597 (Wound Care 1st 20 sq cm) _31  97598 (Wound Care each add'l 20 sq cm) _32  38466 (Vasopneumatic Device) _33  59935 (Orthotic Training) _34  [id number] (Prosthetic Training)  Progress Note reporting period date 01/13/22 to 02/25/22  See below for objective and subjective measurements relating to patients progress with PT.   Patient Details  Name: [name] MRN: [id number] Date of Birth: [birth year] Referring Provider (PT): Donella Stade,  [place]   Encounter Date: 02/25/2022   PT End of Session - 02/25/22 1331     Visit Number 36    Number of Visits 48    Date for PT Re-Evaluation 04/20/22    Authorization Type Humana    Authorization Time Period from 01/04/22 to 03/04/22,  will need new submisison next visit due to visit #    Authorization - Visit Number 12    Authorization - Number of Visits 12   did new hamana auth 01/04/22   Progress Note Due on Visit 39    PT Start Time 1303    PT Stop Time 1345    PT Time Calculation (min) 42 min    Activity Tolerance Patient tolerated treatment well    Behavior During Therapy WFL for tasks assessed/performed             Past Medical History:  Diagnosis Date   Anxiety    Atrial fibrillation [facility])    sees Dr. Audie Box   Cancer [facility])    breast   Chronic pain    Concussion 02/2007   ICU x 3 days   Depression    Hypertension    Hypothyroidism    Personal history of chemotherapy    Personal history of radiation therapy    Spinal stenosis    Thyroid disease ?1994    Past Surgical History:  Procedure Laterality Date   BREAST BIOPSY Right 12/20/2019   x2   BREAST LUMPECTOMY Right 01/22/2020   BREAST LUMPECTOMY WITH RADIOACTIVE SEED AND SENTINEL LYMPH NODE BIOPSY Right 01/22/2020   Procedure: RIGHT BREAST LUMPECTOMY WITH RADIOACTIVE SEED X2 AND RIGHT SENTINEL LYMPH NODE MAPPING;  Surgeon: Erroll Luna, MD;  Location: [place];  Service: General;  Laterality: Right;   BREAST SURGERY Right 03/1998   breast biopsy, benign   EYE SURGERY Right    PORT-A-CATH REMOVAL Right 04/30/2021   Procedure: REMOVAL PORT-A-CATH;  Surgeon: Erroll Luna, MD;  Location: [place];  Service: General;  Laterality: Right;   PORTACATH PLACEMENT Right 01/22/2020   Procedure: INSERTION PORT-A-CATH WITH ULTRASOUND;  Surgeon: Erroll Luna, MD;  Location: [place];  Service: General;  Laterality: Right;   PORTACATH PLACEMENT Right 02/28/2020    Procedure: PORT A CATH REVISION;  Surgeon: Erroll Luna, MD;  Location: [place];  Service: General;  Laterality: Right;   REVERSE SHOULDER ARTHROPLASTY Left 02/02/2021   Procedure: LEFT REVERSE SHOULDER ARTHROPLASTY;  Surgeon: Meredith Pel, MD;  Location: [place];  Service: Orthopedics;  Laterality: Left;   SHOULDER SURGERY Right    SPINAL FUSION  03/04/2011   with ORIF    There were no vitals filed for this visit.   Subjective Assessment - 02/25/22 1333     Subjective She relays 7/10 low back pain today. She still feels she needs to continue with PT.    Pertinent History anxiety, breast cancer with lumpectomy and radioactive seed placement 2021 and recent port removal, chronic pain, concussion 2008, HTN, depression, spinal fusion 2012.    Patient Stated Goals improve gait and balance and back pain    Pain Onset More than a month ago    Pain Onset More than a month ago                [facility] PT Assessment - 02/25/22 0001       Assessment   Medical Diagnosis LBP, Lt knee pain and weakness from Stress Fx,    Referring Provider (PT) Magnant, Gerrianne Scale, PA-C      Strength   Strength Assessment Site Hip    Right/Left Hip Left;Right    Right Hip Flexion 4+/5    Right Hip ABduction 4+/5    Right Hip ADduction 4/5    Left Hip Flexion 4+/5    Left  Hip ABduction 4+/5    Left Hip ADduction 4/5    Right Knee Flexion 5/5    Right Knee Extension 5/5    Left Knee Flexion 5/5    Left Knee Extension 5/5      Berg Balance Test   Sit to Stand Able to stand without using hands and stabilize independently    Standing Unsupported Able to stand safely 2 minutes    Sitting with Back Unsupported but Feet Supported on Floor or Stool Able to sit safely and securely 2 minutes    Stand to Sit Sits safely with minimal use of hands    Transfers Able to transfer safely, minor use of hands    Standing Unsupported with Eyes Closed Able to stand 10 seconds safely    Standing Unsupported with  Feet Together Able to place feet together independently and stand 1 minute safely    From Standing, Reach Forward with Outstretched Arm Can reach confidently >25 cm (10")    From Standing Position, Pick up Object from Floor Able to pick up shoe safely and easily    From Standing Position, Turn to Look Behind Over each Shoulder Looks behind from both sides and weight shifts well    Turn 360 Degrees Able to turn 360 degrees safely but slowly    Standing Unsupported, Alternately Place Feet on Step/Stool Able to complete 4 steps without aid or supervision    Standing Unsupported, One Foot in Front Able to take small step independently and hold 30 seconds    Standing on One Leg Tries to lift leg/unable to hold 3 seconds but remains standing independently    Total Score 47      Timed Up and Go Test   Normal TUG (seconds) 14      Functional Gait  Assessment   Gait Level Surface Walks 20 ft in less than 7 sec but greater than 5.5 sec, uses assistive device, slower speed, mild gait deviations, or deviates 6-10 in outside of the 12 in walkway width.    Change in Gait Speed Able to smoothly change walking speed without loss of balance or gait deviation. Deviate no more than 6 in outside of the 12 in walkway width.    Gait with Horizontal Head Turns Performs head turns smoothly with slight change in gait velocity (eg, minor disruption to smooth gait path), deviates 6-10 in outside 12 in walkway width, or uses an assistive device.    Gait with Vertical Head Turns Performs task with slight change in gait velocity (eg, minor disruption to smooth gait path), deviates 6 - 10 in outside 12 in walkway width or uses assistive device    Gait and Pivot Turn Pivot turns safely in greater than 3 sec and stops with no loss of balance, or pivot turns safely within 3 sec and stops with mild imbalance, requires small steps to catch balance.    Step Over Obstacle Is able to step over one shoe box (4.5 in total height) without  changing gait speed. No evidence of imbalance.    Gait with Narrow Base of Support Ambulates less than 4 steps heel to toe or cannot perform without assistance.    Gait with Eyes Closed Walks 20 ft, slow speed, abnormal gait pattern, evidence for imbalance, deviates 10-15 in outside 12 in walkway width. Requires more than 9 sec to ambulate 20 ft.    Ambulating Backwards Walks 20 ft, uses assistive device, slower speed, mild gait deviations, deviates 6-10 in outside  12 in walkway width.    Steps Alternating feet, must use rail.    Total Score 18                           [facility] Adult PT Treatment/Exercise - 02/25/22 0001       Lumbar Exercises: Aerobic   Nustep L7 x 15 min with intervals each 30 sec of high/low intensity effort for functional endurance and leg strength                       PT Short Term Goals - 02/25/22 1334       PT SHORT TERM GOAL #1   Title independent with intial HEP    Baseline met 10/19 but continue to progress    Time 4    Period Weeks    Status Achieved    Target Date 10/08/21      PT SHORT TERM GOAL #2   Title improve TUG to less than 17 seconds    Baseline Met 10/19, will make new goal for this    Time 4    Period Weeks    Status Achieved    Target Date 10/08/21      PT SHORT TERM GOAL #3   Title Pt will improve TUG time to less than 13 seconds without AD to improve gait speed and balance.    Baseline had met with 10.45 on 11/2 but had 14 on 3/9    Time 4    Period Weeks    Status Revised    Target Date 03/25/22               PT Long Term Goals - 02/25/22 1335       PT LONG TERM GOAL #1   Title She will improve BERG balance score to 46 or more to show improved balance    Baseline had met on 11/2 when scored 51 but scored 47 on 3/9    Time 12    Period Weeks    Status Achieved    Target Date 12/03/21      PT LONG TERM GOAL #2   Title She will improve TUG score to <10 seconds  to show improved balance  and walk speed.    Baseline improved to 11 sec on 1/26 but regressed to 14 sec on 3/9    Time 12    Period Weeks    Status On-going    Target Date 04/20/22      PT LONG TERM GOAL #3   Title report back pain < 3/10 with activity for improved function    Baseline depends, can still get up to 5 at times    Status On-going    Target Date 04/20/22      PT LONG TERM GOAL #4   Title Improve bilat knee/hip strength to 5/5 MMT tested in sitting    Baseline now 4+ for hip and 5 for knee    Time 12    Period Weeks    Status On-going    Target Date 04/20/22      PT LONG TERM GOAL #5   Title improve FGA to at least 24/30 for improved mobility and balance    Baseline 18/24    Status On-going    Target Date 04/20/22      PT LONG TERM GOAL #6   Title -      PT LONG TERM  GOAL #7   Title -                   Plan - 02/25/22 1341     Clinical Impression Statement Progress note and new humana authorization performed today. She was limited by overall back pain causing her gait to be more slower today. She was also more unsteady so her balance scores reduced from last time but still have improved since starting PT. She has however shown good progress in her overall leg strength. Overall I would recommend to continue PT for up to 12 more visits to work to reduce her falls risk and improve her functional tolerance for standing and walking and stairs.    Personal Factors and Comorbidities Comorbidity 3+    Comorbidities anxiety, breast cancer with lumpectomy and radioactive seed placement 2021 and recent port removal, chronic pain, concussion 2008, HTN, depression, spinal fusion 2012    Examination-Activity Limitations Locomotion Level;Reach Overhead;Dressing;Hygiene/Grooming;Lift;Toileting;Bathing    Examination-Participation Restrictions Cleaning;Meal Prep;Community Activity;Driving;Laundry    Stability/Clinical Decision Making Evolving/Moderate complexity    Rehab Potential Fair    PT  Frequency 2x / week    PT Duration 4 weeks    PT Treatment/Interventions ADLs/Self Care Home Management;Therapeutic exercise;Patient/family education;Cryotherapy;DME Instruction;Ultrasound;Moist Heat;Functional mobility training;Therapeutic activities;Balance training;Neuromuscular re-education;Manual techniques;Vasopneumatic Device;Taping;Dry needling;Passive range of motion;Electrical Stimulation;Gait training    PT Next Visit Plan endurance, functional activity, balance, strength    PT Home Exercise Plan Access Code: [email or web address], added tandem balance and SLS    Consulted and Agree with Plan of Care Patient             Patient will benefit from skilled therapeutic intervention in order to improve the following deficits and impairments:  Postural dysfunction, Decreased range of motion, Decreased knowledge of precautions, Increased edema, Impaired UE functional use, Decreased strength, Decreased mobility, Decreased balance, Pain, Difficulty walking, Decreased activity tolerance, Decreased endurance  Visit Diagnosis: Other abnormalities of gait and mobility  Acute pain of right knee  Muscle weakness (generalized)  Chronic bilateral low back pain without sciatica  Difficulty in walking, not elsewhere classified     Problem List Patient Active Problem List   Diagnosis Date Noted   Arthritis of left shoulder region    S/P reverse total shoulder arthroplasty, 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   Vitamin D deficiency 01/15/2021   Vitamin B12 deficiency 01/15/2021   Primary insomnia 01/15/2021   Morbid obesity ([place]) 01/15/2021   Moderate recurrent major depression ([place]) 01/15/2021   Benzodiazepine dependence ([place]) 01/15/2021   Port-A-Cath in place 07/22/2020   Left leg cellulitis 04/17/2020   Cellulitis of left leg 04/17/2020   Hypokalemia 04/17/2020   Macrocytic anemia 04/17/2020   Anxiety 04/17/2020   Depression 04/17/2020   Chronic diarrhea 04/17/2020   Chemotherapy induced diarrhea 04/17/2020    Malignant neoplasm of upper-inner quadrant of right breast in female, estrogen receptor positive ([place]) 12/26/2019   Encephalopathy 02/02/2018   Hypothyroidism 02/02/2018   AKI (acute kidney injury) ([place]) 02/02/2018   Chronic back pain 02/02/2018   Alcohol abuse 02/02/2018   Weight gain 02/02/2018   Benign essential HTN 02/02/2018    Debbe Odea, PT,DPT 02/25/2022, 1:51 PM  [facility] Physical Therapy [address] [place], [place], [address] Phone: [phone]   Fax:  [phone]  Name: [name] MRN: [id number] Date of Birth: [birth year]

## 2022-03-02 ENCOUNTER — Encounter: Payer: Self-pay | Admitting: Physical Therapy

## 2022-03-02 ENCOUNTER — Ambulatory Visit (INDEPENDENT_AMBULATORY_CARE_PROVIDER_SITE_OTHER): Payer: Medicare PPO | Admitting: Physical Therapy

## 2022-03-02 ENCOUNTER — Other Ambulatory Visit: Payer: Self-pay

## 2022-03-02 DIAGNOSIS — M25561 Pain in right knee: Secondary | ICD-10-CM | POA: Diagnosis not present

## 2022-03-02 DIAGNOSIS — R262 Difficulty in walking, not elsewhere classified: Secondary | ICD-10-CM

## 2022-03-02 DIAGNOSIS — R2689 Other abnormalities of gait and mobility: Secondary | ICD-10-CM | POA: Diagnosis not present

## 2022-03-02 DIAGNOSIS — M6281 Muscle weakness (generalized): Secondary | ICD-10-CM | POA: Diagnosis not present

## 2022-03-02 DIAGNOSIS — G8929 Other chronic pain: Secondary | ICD-10-CM

## 2022-03-02 DIAGNOSIS — M545 Low back pain, unspecified: Secondary | ICD-10-CM

## 2022-03-02 NOTE — Therapy (Signed)
[place] [facility] ?[address] ?[place], [place], [address] ?Phone: [phone]   Fax:  [phone] ? ?Physical Therapy Treatment ? ?Patient Details  ?Name: [name] ?MRN: [id number] ?Date of Birth: 1951/06/13 ?Referring Provider (PT): Magnant, Gerrianne Scale, PA-C ? ? ?Encounter Date: 03/02/2022 ? ? PT End of Session - 03/02/22 1347   ? ? Visit Number 82   ? Number of Visits 48   ? Date for PT Re-Evaluation 04/20/22   ? Authorization Type Hum[employer]   ? Authorization Time Period from 3/9 to 04/20/22   ? Authorization - Visit Number 1   ? Authorization - Number of Visits 12   ? Progress Note Due on Visit 46   ? PT Start Time 1300   ? PT Stop Time 1346   ? PT Time Calculation (min) 46 min   ? Activity Tolerance Patient tolerated treatment well   ? Behavior During Therapy [facility] for tasks assessed/performed   ? ?  ?  ? ?  ? ? ?Past Medical History:  ?Diagnosis Date  ? Anxiety   ? Atrial fibrillation ([place])   ? sees Dr. Audie Box  ? Cancer [facility])   ? breast  ? Chronic pain   ? Concussion 02/2007  ? ICU x 3 days  ? Depression   ? Hypertension   ? Hypothyroidism   ? Personal history of chemotherapy   ? Personal history of radiation therapy   ? Spinal stenosis   ? Thyroid disease ?1994  ? ? ?Past Surgical History:  ?Procedure Laterality Date  ? BREAST BIOPSY Right 12/20/2019  ? x2  ? BREAST LUMPECTOMY Right 01/22/2020  ? BREAST LUMPECTOMY WITH RADIOACTIVE SEED AND SENTINEL LYMPH NODE BIOPSY Right 01/22/2020  ? Procedure: RIGHT BREAST LUMPECTOMY WITH RADIOACTIVE SEED X2 AND RIGHT SENTINEL LYMPH NODE MAPPING;  Surgeon: Erroll Luna, MD;  Location: [place];  Service: General;  Laterality: Right;  ? BREAST SURGERY Right 03/1998  ? breast biopsy, benign  ? EYE SURGERY Right   ? PORT-A-CATH REMOVAL Right 04/30/2021  ? Procedure: REMOVAL PORT-A-CATH;  Surgeon: Erroll Luna, MD;  Location: [place];  Service: General;  Laterality: Right;  ? PORTACATH PLACEMENT Right  01/22/2020  ? Procedure: INSERTION PORT-A-CATH WITH ULTRASOUND;  Surgeon: Erroll Luna, MD;  Location: [place];  Service: General;  Laterality: Right;  ? PORTACATH PLACEMENT Right 02/28/2020  ? Procedure: PORT A CATH REVISION;  Surgeon: Erroll Luna, MD;  Location: [place];  Service: General;  Laterality: Right;  ? REVERSE SHOULDER ARTHROPLASTY Left 02/02/2021  ? Procedure: LEFT REVERSE SHOULDER ARTHROPLASTY;  Surgeon: Meredith Pel, MD;  Location: [place];  Service: Orthopedics;  Laterality: Left;  ? SHOULDER SURGERY Right   ? SPINAL FUSION  03/04/2011  ? with ORIF  ? ? ?There were no vitals filed for this visit. ? ? Subjective Assessment - 03/02/22 1350   ? ? Subjective She relays 5/10 low back pain but she feels overall bad like how she felt when she was found to have Cancer. She will follow up with PCP next week.   ? Pertinent History anxiety, breast cancer with lumpectomy and radioactive seed placement 2021 and recent port removal, chronic pain, concussion 2008, HTN, depression, spinal fusion 2012.   ? Patient Stated Goals improve gait and balance and back pain   ? Pain Onset More than a month ago   ? Pain Onset More than a month ago   ? ?  ?  ? ?  ? ?  [place] Adult PT Treatment/Exercise - 03/02/22 0001   ? ?  ? Ambulation/Gait  ? Ambulation/Gait Yes   ? Ambulation/Gait Assistance 5: Supervision   ? Ambulation Distance (Feet) 150 Feet   ? Assistive device None   ? Gait Comments slower velocity and wider BOS, now only minimal supervision needed   ?  ? Lumbar Exercises: Aerobic  ? Nustep L7 x 15 min with intervals each 30 sec of high/low intensity effort for functional endurance and leg strength   ?  ? Lumbar Exercises: Machines for Strengthening  ? Cybex Knee Extension 25# 3X10   ? Cybex Knee Flexion 25# 2x15   ? Leg Press 100# 3X15, SL 75# 2X15 bilat   ?  ? Lumbar Exercises: Standing  ? Other Standing Lumbar Exercises step ups fwd and lateral on 6 inch step X10 bilat with one UE support    ?  ? Lumbar Exercises: Seated  ? Other Seated Lumbar Exercises sit to stand with bilat shoulder flexion 5# KB X 10   ? ?  ?  ? ?  ? ? ? ? ? ? ? ? ? ? ? ? PT Short Term Goals - 02/25/22 1334   ? ?  ? PT SHORT TERM GOAL #1  ? Title independent with intial HEP   ? Baseline met 10/19 but continue to progress   ? Time 4   ? Period Weeks   ? Status Achieved   ? Target Date 10/08/21   ?  ? PT SHORT TERM GOAL #2  ? Title improve TUG to less than 17 seconds   ? Baseline Met 10/19, will make new goal for this   ? Time 4   ? Period Weeks   ? Status Achieved   ? Target Date 10/08/21   ?  ? PT SHORT TERM GOAL #3  ? Title Pt will improve TUG time to less than 13 seconds without AD to improve gait speed and balance.   ? Baseline had met with 10.45 on 11/2 but had 14 on 3/9   ? Time 4   ? Period Weeks   ? Status Revised   ? Target Date 03/25/22   ? ?  ?  ? ?  ? ? ? ? PT Long Term Goals - 02/25/22 1335   ? ?  ? PT LONG TERM GOAL #1  ? Title She will improve BERG balance score to 46 or more to show improved balance   ? Baseline had met on 11/2 when scored 51 but scored 47 on 3/9   ? Time 12   ? Period Weeks   ? Status Achieved   ? Target Date 12/03/21   ?  ? PT LONG TERM GOAL #2  ? Title She will improve TUG score to <10 seconds  to show improved balance and walk speed.   ? Baseline improved to 11 sec on 1/26 but regressed to 14 sec on 3/9   ? Time 12   ? Period Weeks   ? Status On-going   ? Target Date 04/20/22   ?  ? PT LONG TERM GOAL #3  ? Title report back pain < 3/10 with activity for improved function   ? Baseline depends, can still get up to 5 at times   ? Status On-going   ? Target Date 04/20/22   ?  ? PT LONG TERM GOAL #4  ? Title Improve bilat knee/hip strength to 5/5 MMT tested in sitting   ? Baseline  now 4+ for hip and 5 for knee   ? Time 12   ? Period Weeks   ? Status On-going   ? Target Date 04/20/22   ?  ? PT LONG TERM GOAL #5  ? Title improve FGA to at least 24/30 for improved mobility and balance   ? Baseline 18/24    ? Status On-going   ? Target Date 04/20/22   ?  ? PT LONG TERM GOAL #6  ? Title -   ?  ? PT LONG TERM GOAL #7  ? Title -   ? ?  ?  ? ?  ? ? ? ? ? ? ? ? Plan - 03/02/22 1351   ? ? Clinical Impression Statement She was limited by fatigue today so needed more rest breaks overall. She will follow up with PCP due to not feeling well over last week.   ? Personal Factors and Comorbidities Comorbidity 3+   ? Comorbidities anxiety, breast cancer with lumpectomy and radioactive seed placement 2021 and recent port removal, chronic pain, concussion 2008, HTN, depression, spinal fusion 2012   ? Examination-Activity Limitations Locomotion Level;Reach Overhead;Dressing;Hygiene/Grooming;Lift;Toileting;Bathing   ? Examination-Participation Restrictions Cleaning;Meal Prep;Community Activity;Driving;Laundry   ? Stability/Clinical Decision Making Evolving/Moderate complexity   ? Rehab Potential Fair   ? PT Frequency 2x / week   ? PT Duration 4 weeks   ? PT Treatment/Interventions ADLs/Self Care Home Management;Therapeutic exercise;Patient/family education;Cryotherapy;DME Instruction;Ultrasound;Moist Heat;Functional mobility training;Therapeutic activities;Balance training;Neuromuscular re-education;Manual techniques;Vasopneumatic Device;Taping;Dry needling;Passive range of motion;Electrical Stimulation;Gait training   ? PT Next Visit Plan endurance, functional activity, balance, strength   ? PT Home Exercise Plan Access Code: [email or web address], added tandem balance and SLS   ? Consulted and Agree with Plan of Care Patient   ? ?  ?  ? ?  ? ? ?Patient will benefit from skilled therapeutic intervention in order to improve the following deficits and impairments:  Postural dysfunction, Decreased range of motion, Decreased knowledge of precautions, Increased edema, Impaired UE functional use, Decreased strength, Decreased mobility, Decreased balance, Pain, Difficulty walking, Decreased activity tolerance, Decreased endurance ? ?Visit  Diagnosis: ?Other abnormalities of gait and mobility ? ?Muscle weakness (generalized) ? ?Acute pain of right knee ? ?Chronic bilateral low back pain without sciatica ? ?Difficulty in walking, not elsewhere classified ?

## 2022-03-04 ENCOUNTER — Ambulatory Visit (INDEPENDENT_AMBULATORY_CARE_PROVIDER_SITE_OTHER): Payer: Medicare PPO | Admitting: Physical Therapy

## 2022-03-04 ENCOUNTER — Other Ambulatory Visit: Payer: Self-pay

## 2022-03-04 ENCOUNTER — Encounter: Payer: Self-pay | Admitting: Physical Therapy

## 2022-03-04 DIAGNOSIS — R2689 Other abnormalities of gait and mobility: Secondary | ICD-10-CM | POA: Diagnosis not present

## 2022-03-04 DIAGNOSIS — M6281 Muscle weakness (generalized): Secondary | ICD-10-CM | POA: Diagnosis not present

## 2022-03-04 DIAGNOSIS — G8929 Other chronic pain: Secondary | ICD-10-CM | POA: Diagnosis not present

## 2022-03-04 DIAGNOSIS — M545 Low back pain, unspecified: Secondary | ICD-10-CM

## 2022-03-04 DIAGNOSIS — R262 Difficulty in walking, not elsewhere classified: Secondary | ICD-10-CM

## 2022-03-04 DIAGNOSIS — M25561 Pain in right knee: Secondary | ICD-10-CM | POA: Diagnosis not present

## 2022-03-04 NOTE — Therapy (Signed)
[facility] ?[address] ?[place], [place], [address] ?Phone: [phone]   Fax:  [phone] ? ?Physical Therapy Treatment ? ?Patient Details  ?Name: [name] ?MRN: [id number] ?Date of Birth: [birth year] ?Referring Provider (PT): Magnant, Gerrianne Scale, PA-C ? ? ?Encounter Date: 03/04/2022 ? ? PT End of Session - 03/04/22 1306   ? ? Visit Number 38   ? Number of Visits 48   ? Date for PT Re-Evaluation 04/20/22   ? Authorization Type [employer]   ? Authorization Time Period from 3/9 to 04/20/22   ? Authorization - Visit Number 2   ? Authorization - Number of Visits 12   ? Progress Note Due on Visit 46   ? PT Start Time 1300   ? PT Stop Time 5427   ? PT Time Calculation (min) 45 min   ? Activity Tolerance Patient tolerated treatment well   ? Behavior During Therapy [facility] for tasks assessed/performed   ? ?  ?  ? ?  ? ? ?Past Medical History:  ?Diagnosis Date  ? Anxiety   ? Atrial fibrillation ([place])   ? sees Dr. Audie Box  ? Cancer [facility])   ? breast  ? Chronic pain   ? Concussion 02/2007  ? ICU x 3 days  ? Depression   ? Hypertension   ? Hypothyroidism   ? Personal history of chemotherapy   ? Personal history of radiation therapy   ? Spinal stenosis   ? Thyroid disease ?1994  ? ? ?Past Surgical History:  ?Procedure Laterality Date  ? BREAST BIOPSY Right 12/20/2019  ? x2  ? BREAST LUMPECTOMY Right 01/22/2020  ? BREAST LUMPECTOMY WITH RADIOACTIVE SEED AND SENTINEL LYMPH NODE BIOPSY Right 01/22/2020  ? Procedure: RIGHT BREAST LUMPECTOMY WITH RADIOACTIVE SEED X2 AND RIGHT SENTINEL LYMPH NODE MAPPING;  Surgeon: Erroll Luna, MD;  Location: [place];  Service: General;  Laterality: Right;  ? BREAST SURGERY Right 03/1998  ? breast biopsy, benign  ? EYE SURGERY Right   ? PORT-A-CATH REMOVAL Right 04/30/2021  ? Procedure: REMOVAL PORT-A-CATH;  Surgeon: Erroll Luna, MD;  Location: [place];  Service: General;  Laterality: Right;  ? PORTACATH PLACEMENT Right  01/22/2020  ? Procedure: INSERTION PORT-A-CATH WITH ULTRASOUND;  Surgeon: Erroll Luna, MD;  Location: [place];  Service: General;  Laterality: Right;  ? PORTACATH PLACEMENT Right 02/28/2020  ? Procedure: PORT A CATH REVISION;  Surgeon: Erroll Luna, MD;  Location: [place];  Service: General;  Laterality: Right;  ? REVERSE SHOULDER ARTHROPLASTY Left 02/02/2021  ? Procedure: LEFT REVERSE SHOULDER ARTHROPLASTY;  Surgeon: Meredith Pel, MD;  Location: [place];  Service: Orthopedics;  Laterality: Left;  ? SHOULDER SURGERY Right   ? SPINAL FUSION  03/04/2011  ? with ORIF  ? ? ?There were no vitals filed for this visit. ? ? Subjective Assessment - 03/04/22 1307   ? ? Subjective She says the low back pain is not too bad today but she is very tired still.   ? Pertinent History anxiety, breast cancer with lumpectomy and radioactive seed placement 2021 and recent port removal, chronic pain, concussion 2008, HTN, depression, spinal fusion 2012.   ? Patient Stated Goals improve gait and balance and back pain   ? Pain Onset More than a month ago   ? Pain Onset More than a month ago   ? ?  ?  ? ?  ? ? [place] Adult PT Treatment/Exercise - 03/04/22 0001   ? ?  ?  Ambulation/Gait  ? Ambulation/Gait Yes (P)    ? Ambulation/Gait Assistance 5: Supervision (P)    ? Ambulation Distance (Feet) 300 Feet (P)    ? Assistive device None (P)    ? Gait Comments slower velocity and wider BOS, now only minimal supervision needed (P)    ?  ? Lumbar Exercises: Aerobic  ? Nustep L7 x 15 min with intervals each 30 sec of high/low intensity effort for functional endurance and leg strength (P)    ?  ? Lumbar Exercises: Machines for Strengthening  ? Cybex Knee Extension 25# 3X10 (P)    ? Cybex Knee Flexion 25# 2x15 (P)    ? Leg Press 100# 3X15, SL 75# 2X15 bilat (P)    ?  ? Lumbar Exercises: Standing  ? Other Standing Lumbar Exercises step ups fwd and lateral on 6 inch step X10 bilat with one UE support (P)    ?  ? Lumbar  Exercises: Seated  ? Other Seated Lumbar Exercises sit to stand with bilat shoulder flexion 5# KB X 10 (P)    ? ?  ?  ? ?  ? ? ? ? ? ? ? ? ? ? ? ? PT Short Term Goals - 02/25/22 1334   ? ?  ? PT SHORT TERM GOAL #1  ? Title independent with intial HEP   ? Baseline met 10/19 but continue to progress   ? Time 4   ? Period Weeks   ? Status Achieved   ? Target Date 10/08/21   ?  ? PT SHORT TERM GOAL #2  ? Title improve TUG to less than 17 seconds   ? Baseline Met 10/19, will make new goal for this   ? Time 4   ? Period Weeks   ? Status Achieved   ? Target Date 10/08/21   ?  ? PT SHORT TERM GOAL #3  ? Title Pt will improve TUG time to less than 13 seconds without AD to improve gait speed and balance.   ? Baseline had met with 10.45 on 11/2 but had 14 on 3/9   ? Time 4   ? Period Weeks   ? Status Revised   ? Target Date 03/25/22   ? ?  ?  ? ?  ? ? ? ? PT Long Term Goals - 02/25/22 1335   ? ?  ? PT LONG TERM GOAL #1  ? Title She will improve BERG balance score to 46 or more to show improved balance   ? Baseline had met on 11/2 when scored 51 but scored 47 on 3/9   ? Time 12   ? Period Weeks   ? Status Achieved   ? Target Date 12/03/21   ?  ? PT LONG TERM GOAL #2  ? Title She will improve TUG score to <10 seconds  to show improved balance and walk speed.   ? Baseline improved to 11 sec on 1/26 but regressed to 14 sec on 3/9   ? Time 12   ? Period Weeks   ? Status On-going   ? Target Date 04/20/22   ?  ? PT LONG TERM GOAL #3  ? Title report back pain < 3/10 with activity for improved function   ? Baseline depends, can still get up to 5 at times   ? Status On-going   ? Target Date 04/20/22   ?  ? PT LONG TERM GOAL #4  ? Title Improve bilat knee/hip strength to  5/5 MMT tested in sitting   ? Baseline now 4+ for hip and 5 for knee   ? Time 12   ? Period Weeks   ? Status On-going   ? Target Date 04/20/22   ?  ? PT LONG TERM GOAL #5  ? Title improve FGA to at least 24/30 for improved mobility and balance   ? Baseline 18/24   ?  Status On-going   ? Target Date 04/20/22   ?  ? PT LONG TERM GOAL #6  ? Title -   ?  ? PT LONG TERM GOAL #7  ? Title -   ? ?  ?  ? ?  ? ? ? ? ? ? ? ? Plan - 03/04/22 1409   ? ? Clinical Impression Statement She was still limited by fatigue and will follow up with PCP next week about this. She does not have another PT appointment for 2 weeks so we placed her on waiting list and will call to get her back in if we have any openings. We encouraged her to be consistent with her HEP during this time.   ? Personal Factors and Comorbidities Comorbidity 3+   ? Comorbidities anxiety, breast cancer with lumpectomy and radioactive seed placement 2021 and recent port removal, chronic pain, concussion 2008, HTN, depression, spinal fusion 2012   ? Examination-Activity Limitations Locomotion Level;Reach Overhead;Dressing;Hygiene/Grooming;Lift;Toileting;Bathing   ? Examination-Participation Restrictions Cleaning;Meal Prep;Community Activity;Driving;Laundry   ? Stability/Clinical Decision Making Evolving/Moderate complexity   ? Rehab Potential Fair   ? PT Frequency 2x / week   ? PT Duration 4 weeks   ? PT Treatment/Interventions ADLs/Self Care Home Management;Therapeutic exercise;Patient/family education;Cryotherapy;DME Instruction;Ultrasound;Moist Heat;Functional mobility training;Therapeutic activities;Balance training;Neuromuscular re-education;Manual techniques;Vasopneumatic Device;Taping;Dry needling;Passive range of motion;Electrical Stimulation;Gait training   ? PT Next Visit Plan endurance, functional activity, balance, strength   ? PT Home Exercise Plan Access Code: [email or web address], added tandem balance and SLS   ? Consulted and Agree with Plan of Care Patient   ? ?  ?  ? ?  ? ? ?Patient will benefit from skilled therapeutic intervention in order to improve the following deficits and impairments:  Postural dysfunction, Decreased range of motion, Decreased knowledge of precautions, Increased edema, Impaired UE functional use,  Decreased strength, Decreased mobility, Decreased balance, Pain, Difficulty walking, Decreased activity tolerance, Decreased endurance ? ?Visit Diagnosis: ?Other abnormalities of gait and mobility ? ?Muscle weakness (genera

## 2022-03-08 DIAGNOSIS — R61 Generalized hyperhidrosis: Secondary | ICD-10-CM | POA: Diagnosis not present

## 2022-03-08 DIAGNOSIS — R233 Spontaneous ecchymoses: Secondary | ICD-10-CM | POA: Diagnosis not present

## 2022-03-08 DIAGNOSIS — E559 Vitamin D deficiency, unspecified: Secondary | ICD-10-CM | POA: Diagnosis not present

## 2022-03-08 DIAGNOSIS — E538 Deficiency of other specified B group vitamins: Secondary | ICD-10-CM | POA: Diagnosis not present

## 2022-03-08 DIAGNOSIS — R140 Abdominal distension (gaseous): Secondary | ICD-10-CM | POA: Diagnosis not present

## 2022-03-08 DIAGNOSIS — Z23 Encounter for immunization: Secondary | ICD-10-CM | POA: Diagnosis not present

## 2022-03-08 DIAGNOSIS — E039 Hypothyroidism, unspecified: Secondary | ICD-10-CM | POA: Diagnosis not present

## 2022-03-08 DIAGNOSIS — D649 Anemia, unspecified: Secondary | ICD-10-CM | POA: Diagnosis not present

## 2022-03-08 DIAGNOSIS — I10 Essential (primary) hypertension: Secondary | ICD-10-CM | POA: Diagnosis not present

## 2022-03-10 ENCOUNTER — Other Ambulatory Visit: Payer: Self-pay | Admitting: Family Medicine

## 2022-03-10 DIAGNOSIS — E2839 Other primary ovarian failure: Secondary | ICD-10-CM

## 2022-03-22 ENCOUNTER — Ambulatory Visit (INDEPENDENT_AMBULATORY_CARE_PROVIDER_SITE_OTHER): Payer: Medicare PPO | Admitting: Physical Therapy

## 2022-03-22 ENCOUNTER — Encounter: Payer: Self-pay | Admitting: Physical Therapy

## 2022-03-22 DIAGNOSIS — M6281 Muscle weakness (generalized): Secondary | ICD-10-CM | POA: Diagnosis not present

## 2022-03-22 DIAGNOSIS — M25561 Pain in right knee: Secondary | ICD-10-CM

## 2022-03-22 DIAGNOSIS — R262 Difficulty in walking, not elsewhere classified: Secondary | ICD-10-CM

## 2022-03-22 DIAGNOSIS — G8929 Other chronic pain: Secondary | ICD-10-CM | POA: Diagnosis not present

## 2022-03-22 DIAGNOSIS — M545 Low back pain, unspecified: Secondary | ICD-10-CM | POA: Diagnosis not present

## 2022-03-22 DIAGNOSIS — R2689 Other abnormalities of gait and mobility: Secondary | ICD-10-CM

## 2022-03-22 NOTE — Therapy (Signed)
[place] [facility] ?[address] ?[place], [place], [address] ?Phone: [phone]   Fax:  [phone] ? ?Physical Therapy Treatment ? ?Patient Details  ?Name: [name] ?MRN: [id number] ?Date of Birth: 04/12/1951 ?Referring Provider (PT): Magnant, Gerrianne Scale, PA-C ? ? ?Encounter Date: 03/22/2022 ? ? PT End of Session - 03/22/22 1308   ? ? Visit Number 39   ? Number of Visits 48   ? Date for PT Re-Evaluation 04/20/22   ? Authorization Type [employer]   ? Authorization Time Period from 3/9 to 04/20/22   ? Authorization - Visit Number 3   ? Authorization - Number of Visits 12   ? Progress Note Due on Visit 46   ? PT Start Time 1300   ? PT Stop Time 1325   ? PT Time Calculation (min) 25 min   ? Activity Tolerance Patient tolerated treatment well   ? Behavior During Therapy [facility] for tasks assessed/performed   ? ?  ?  ? ?  ? ? ?Past Medical History:  ?Diagnosis Date  ? Anxiety   ? Atrial fibrillation ([place])   ? sees Dr. Audie Box  ? Cancer [facility])   ? breast  ? Chronic pain   ? Concussion 02/2007  ? ICU x 3 days  ? Depression   ? Hypertension   ? Hypothyroidism   ? Personal history of chemotherapy   ? Personal history of radiation therapy   ? Spinal stenosis   ? Thyroid disease ?1994  ? ? ?Past Surgical History:  ?Procedure Laterality Date  ? BREAST BIOPSY Right 12/20/2019  ? x2  ? BREAST LUMPECTOMY Right 01/22/2020  ? BREAST LUMPECTOMY WITH RADIOACTIVE SEED AND SENTINEL LYMPH NODE BIOPSY Right 01/22/2020  ? Procedure: RIGHT BREAST LUMPECTOMY WITH RADIOACTIVE SEED X2 AND RIGHT SENTINEL LYMPH NODE MAPPING;  Surgeon: Erroll Luna, MD;  Location: [place];  Service: General;  Laterality: Right;  ? BREAST SURGERY Right 03/1998  ? breast biopsy, benign  ? EYE SURGERY Right   ? PORT-A-CATH REMOVAL Right 04/30/2021  ? Procedure: REMOVAL PORT-A-CATH;  Surgeon: Erroll Luna, MD;  Location: [place];  Service: General;  Laterality: Right;  ? PORTACATH PLACEMENT Right  01/22/2020  ? Procedure: INSERTION PORT-A-CATH WITH ULTRASOUND;  Surgeon: Erroll Luna, MD;  Location: [place];  Service: General;  Laterality: Right;  ? PORTACATH PLACEMENT Right 02/28/2020  ? Procedure: PORT A CATH REVISION;  Surgeon: Erroll Luna, MD;  Location: [place];  Service: General;  Laterality: Right;  ? REVERSE SHOULDER ARTHROPLASTY Left 02/02/2021  ? Procedure: LEFT REVERSE SHOULDER ARTHROPLASTY;  Surgeon: Meredith Pel, MD;  Location: [place];  Service: Orthopedics;  Laterality: Left;  ? SHOULDER SURGERY Right   ? SPINAL FUSION  03/04/2011  ? with ORIF  ? ? ?There were no vitals filed for this visit. ? ? Subjective Assessment - 03/22/22 1314   ? ? Subjective relays her back pain is bad today, she says she is not good at providing number of intensisty. When asked too provide number she just states "a lot". She is limited by fatigue and nausea/vomiting today   ? Pertinent History anxiety, breast cancer with lumpectomy and radioactive seed placement 2021 and recent port removal, chronic pain, concussion 2008, HTN, depression, spinal fusion 2012.   ? Patient Stated Goals improve gait and balance and back pain   ? Pain Onset More than a month ago   ? Pain Onset More than a month ago   ? ?  ?  ? ?  ? ? ? ?  [place] Adult PT Treatment/Exercise - 03/22/22 0001   ? ?  ? Ambulation/Gait  ? Ambulation/Gait Yes   ? Ambulation/Gait Assistance 5: Supervision   ? Ambulation Distance (Feet) 100 Feet   ? Assistive device None   ? Gait Comments slower velocity and wider BOS, now only minimal supervision needed   ?  ? Lumbar Exercises: Aerobic  ? UBE (Upper Arm Bike) UE/LE X 5 min L4   ? Nustep L8 5 min X 2 with intervals each 30 sec of high/low intensity effort for functional endurance and leg strength   ?  ? Lumbar Exercises: Machines for Strengthening  ? Cybex Knee Extension --   ? Cybex Knee Flexion --   ? Leg Press --   ?  ? Lumbar Exercises: Standing  ? Other Standing Lumbar Exercises --   ?   ? Lumbar Exercises: Seated  ? Other Seated Lumbar Exercises --   ? ?  ?  ? ?  ? ? ? ? ? ? ? ? ? ? ? ? PT Short Term Goals - 02/25/22 1334   ? ?  ? PT SHORT TERM GOAL #1  ? Title independent with intial HEP   ? Baseline met 10/19 but continue to progress   ? Time 4   ? Period Weeks   ? Status Achieved   ? Target Date 10/08/21   ?  ? PT SHORT TERM GOAL #2  ? Title improve TUG to less than 17 seconds   ? Baseline Met 10/19, will make new goal for this   ? Time 4   ? Period Weeks   ? Status Achieved   ? Target Date 10/08/21   ?  ? PT SHORT TERM GOAL #3  ? Title Pt will improve TUG time to less than 13 seconds without AD to improve gait speed and balance.   ? Baseline had met with 10.45 on 11/2 but had 14 on 3/9   ? Time 4   ? Period Weeks   ? Status Revised   ? Target Date 03/25/22   ? ?  ?  ? ?  ? ? ? ? PT Long Term Goals - 02/25/22 1335   ? ?  ? PT LONG TERM GOAL #1  ? Title She will improve BERG balance score to 46 or more to show improved balance   ? Baseline had met on 11/2 when scored 51 but scored 47 on 3/9   ? Time 12   ? Period Weeks   ? Status Achieved   ? Target Date 12/03/21   ?  ? PT LONG TERM GOAL #2  ? Title She will improve TUG score to <10 seconds  to show improved balance and walk speed.   ? Baseline improved to 11 sec on 1/26 but regressed to 14 sec on 3/9   ? Time 12   ? Period Weeks   ? Status On-going   ? Target Date 04/20/22   ?  ? PT LONG TERM GOAL #3  ? Title report back pain < 3/10 with activity for improved function   ? Baseline depends, can still get up to 5 at times   ? Status On-going   ? Target Date 04/20/22   ?  ? PT LONG TERM GOAL #4  ? Title Improve bilat knee/hip strength to 5/5 MMT tested in sitting   ? Baseline now 4+ for hip and 5 for knee   ? Time 12   ? Period Weeks   ?  Status On-going   ? Target Date 04/20/22   ?  ? PT LONG TERM GOAL #5  ? Title improve FGA to at least 24/30 for improved mobility and balance   ? Baseline 18/24   ? Status On-going   ? Target Date 04/20/22   ?  ?  PT LONG TERM GOAL #6  ? Title -   ?  ? PT LONG TERM GOAL #7  ? Title -   ? ?  ?  ? ?  ? ? ? ? ? ? ? ? Plan - 03/22/22 1331   ? ? Clinical Impression Statement She had follow up with PCP but does not have results yet if cancer has returned. She does express she does not feel well and can't keep any food down. She was limited by nausea and vomiting and requested to finish session early today. She will be back on 03/24/22 and she is feeling better we will try to progress her activity if tolerated.   ? Personal Factors and Comorbidities Comorbidity 3+   ? Comorbidities anxiety, breast cancer with lumpectomy and radioactive seed placement 2021 and recent port removal, chronic pain, concussion 2008, HTN, depression, spinal fusion 2012   ? Examination-Activity Limitations Locomotion Level;Reach Overhead;Dressing;Hygiene/Grooming;Lift;Toileting;Bathing   ? Examination-Participation Restrictions Cleaning;Meal Prep;Community Activity;Driving;Laundry   ? Stability/Clinical Decision Making Evolving/Moderate complexity   ? Rehab Potential Fair   ? PT Frequency 2x / week   ? PT Duration 4 weeks   ? PT Treatment/Interventions ADLs/Self Care Home Management;Therapeutic exercise;Patient/family education;Cryotherapy;DME Instruction;Ultrasound;Moist Heat;Functional mobility training;Therapeutic activities;Balance training;Neuromuscular re-education;Manual techniques;Vasopneumatic Device;Taping;Dry needling;Passive range of motion;Electrical Stimulation;Gait training   ? PT Next Visit Plan endurance, functional activity, balance, strength   ? PT Home Exercise Plan Access Code: [email or web address], added tandem balance and SLS   ? Consulted and Agree with Plan of Care Patient   ? ?  ?  ? ?  ? ? ?Patient will benefit from skilled therapeutic intervention in order to improve the following deficits and impairments:  Postural dysfunction, Decreased range of motion, Decreased knowledge of precautions, Increased edema, Impaired UE functional use,  Decreased strength, Decreased mobility, Decreased balance, Pain, Difficulty walking, Decreased activity tolerance, Decreased endurance ? ?Visit Diagnosis: ?Other abnormalities of gait and mobility ? ?Muscle weakness

## 2022-03-24 ENCOUNTER — Ambulatory Visit (INDEPENDENT_AMBULATORY_CARE_PROVIDER_SITE_OTHER): Payer: Medicare PPO | Admitting: Physical Therapy

## 2022-03-24 ENCOUNTER — Encounter: Payer: Self-pay | Admitting: Physical Therapy

## 2022-03-24 DIAGNOSIS — M6281 Muscle weakness (generalized): Secondary | ICD-10-CM

## 2022-03-24 DIAGNOSIS — G8929 Other chronic pain: Secondary | ICD-10-CM | POA: Diagnosis not present

## 2022-03-24 DIAGNOSIS — R2689 Other abnormalities of gait and mobility: Secondary | ICD-10-CM

## 2022-03-24 DIAGNOSIS — M25561 Pain in right knee: Secondary | ICD-10-CM | POA: Diagnosis not present

## 2022-03-24 DIAGNOSIS — R262 Difficulty in walking, not elsewhere classified: Secondary | ICD-10-CM | POA: Diagnosis not present

## 2022-03-24 DIAGNOSIS — M545 Low back pain, unspecified: Secondary | ICD-10-CM

## 2022-03-24 NOTE — Therapy (Signed)
[facility] ?[address] ?[place], [place], [address] ?Phone: [phone]   Fax:  [phone] ? ?Physical Therapy Treatment ? ?Patient Details  ?Name: Isabel [name] ?MRN: [id number] ?Date of Birth: 1951/10/09 ?Referring Provider (PT): Magnant, Gerrianne Scale, PA-C ? ? ?Encounter Date: 03/24/2022 ? ? PT End of Session - 03/24/22 1357   ? ? Visit Number 40   ? Number of Visits 48   ? Date for PT Re-Evaluation 04/20/22   ? Authorization Type [employer]   ? Authorization Time Period from 3/9 to 04/20/22   ? Authorization - Visit Number 4   ? Authorization - Number of Visits 12   ? Progress Note Due on Visit 46   ? PT Start Time 1300   ? PT Stop Time 1340   ? PT Time Calculation (min) 40 min   ? Activity Tolerance Patient tolerated treatment [name]   ? Behavior During Therapy [facility] for tasks assessed/performed   ? ?  ?  ? ?  ? ? ?Past Medical History:  ?Diagnosis Date  ? Anxiety   ? Atrial fibrillation ([place])   ? sees Dr. Audie Box  ? Cancer [facility])   ? breast  ? Chronic pain   ? Concussion 02/2007  ? ICU x 3 days  ? Depression   ? Hypertension   ? Hypothyroidism   ? Personal history of chemotherapy   ? Personal history of radiation therapy   ? Spinal stenosis   ? Thyroid disease ?1994  ? ? ?Past Surgical History:  ?Procedure Laterality Date  ? BREAST BIOPSY Right 12/20/2019  ? x2  ? BREAST LUMPECTOMY Right 01/22/2020  ? BREAST LUMPECTOMY WITH RADIOACTIVE SEED AND SENTINEL LYMPH NODE BIOPSY Right 01/22/2020  ? Procedure: RIGHT BREAST LUMPECTOMY WITH RADIOACTIVE SEED X2 AND RIGHT SENTINEL LYMPH NODE MAPPING;  Surgeon: Erroll Luna, MD;  Location: [place];  Service: General;  Laterality: Right;  ? BREAST SURGERY Right 03/1998  ? breast biopsy, benign  ? EYE SURGERY Right   ? PORT-A-CATH REMOVAL Right 04/30/2021  ? Procedure: REMOVAL PORT-A-CATH;  Surgeon: Erroll Luna, MD;  Location: [place];  Service: General;  Laterality: Right;  ? PORTACATH PLACEMENT Right  01/22/2020  ? Procedure: INSERTION PORT-A-CATH WITH ULTRASOUND;  Surgeon: Erroll Luna, MD;  Location: [place];  Service: General;  Laterality: Right;  ? PORTACATH PLACEMENT Right 02/28/2020  ? Procedure: PORT A CATH REVISION;  Surgeon: Erroll Luna, MD;  Location: [place];  Service: General;  Laterality: Right;  ? REVERSE SHOULDER ARTHROPLASTY Left 02/02/2021  ? Procedure: LEFT REVERSE SHOULDER ARTHROPLASTY;  Surgeon: Meredith Pel, MD;  Location: [place];  Service: Orthopedics;  Laterality: Left;  ? SHOULDER SURGERY Right   ? SPINAL FUSION  03/04/2011  ? with ORIF  ? ? ?There were no vitals filed for this visit. ? ? Subjective Assessment - 03/24/22 1358   ? ? Subjective Overall feels better than she did last session when she had to leave early. Her back pain is localized to left lower back and she requests DN for this today.   ? Pertinent History anxiety, breast cancer with lumpectomy and radioactive seed placement 2021 and recent port removal, chronic pain, concussion 2008, HTN, depression, spinal fusion 2012.   ? Patient Stated Goals improve gait and balance and back pain   ? Pain Onset More than a month ago   ? Pain Onset More than a month ago   ? ?  ?  ? ?  ? ? ? ?  [place] Adult PT Treatment/Exercise - 03/24/22 0001   ? ?  ? Lumbar Exercises: Aerobic  ? Nustep L7 15 min with intervals each 30 sec of high/low intensity effort for functional endurance and leg strength   ?  ? Lumbar Exercises: Machines for Strengthening  ? Leg Press 112# 2X15, SL 75# 2X15 bilat   ?  ? Manual Therapy  ? Manual therapy comments compression and skilled palpation during DN   ? ?  ?  ? ?  ? ? ? Trigger Point Dry Needling - 03/24/22 0001   ? ? Consent Given? Yes   ? Education Handout Provided Previously provided   ? Muscles Treated Back/Hip Quadratus lumborum;Erector spinae;Lumbar multifidi   ? Dry Needling Comments good overall tolerance noted, no immediate adverse reaction, Estim today micro current with 100  frequency intensity to her tolerance   ? ?  ?  ? ?  ? ? ? ? ? ? ? ? ? ? PT Short Term Goals - 02/25/22 1334   ? ?  ? PT SHORT TERM GOAL #1  ? Title independent with intial HEP   ? Baseline met 10/19 but continue to progress   ? Time 4   ? Period Weeks   ? Status Achieved   ? Target Date 10/08/21   ?  ? PT SHORT TERM GOAL #2  ? Title improve TUG to less than 17 seconds   ? Baseline Met 10/19, will make new goal for this   ? Time 4   ? Period Weeks   ? Status Achieved   ? Target Date 10/08/21   ?  ? PT SHORT TERM GOAL #3  ? Title Pt will improve TUG time to less than 13 seconds without AD to improve gait speed and balance.   ? Baseline had met with 10.45 on 11/2 but had 14 on 3/9   ? Time 4   ? Period Weeks   ? Status Revised   ? Target Date 03/25/22   ? ?  ?  ? ?  ? ? ? ? PT Long Term Goals - 02/25/22 1335   ? ?  ? PT LONG TERM GOAL #1  ? Title She will improve BERG balance score to 46 or more to show improved balance   ? Baseline had met on 11/2 when scored 51 but scored 47 on 3/9   ? Time 12   ? Period Weeks   ? Status Achieved   ? Target Date 12/03/21   ?  ? PT LONG TERM GOAL #2  ? Title She will improve TUG score to <10 seconds  to show improved balance and walk speed.   ? Baseline improved to 11 sec on 1/26 but regressed to 14 sec on 3/9   ? Time 12   ? Period Weeks   ? Status On-going   ? Target Date 04/20/22   ?  ? PT LONG TERM GOAL #3  ? Title report back pain < 3/10 with activity for improved function   ? Baseline depends, can still get up to 5 at times   ? Status On-going   ? Target Date 04/20/22   ?  ? PT LONG TERM GOAL #4  ? Title Improve bilat knee/hip strength to 5/5 MMT tested in sitting   ? Baseline now 4+ for hip and 5 for knee   ? Time 12   ? Period Weeks   ? Status On-going   ? Target Date 04/20/22   ?  ?  PT LONG TERM GOAL #5  ? Title improve FGA to at least 24/30 for improved mobility and balance   ? Baseline 18/24   ? Status On-going   ? Target Date 04/20/22   ?  ? PT LONG TERM GOAL #6  ? Title -    ?  ? PT LONG TERM GOAL #7  ? Title -   ? ?  ?  ? ?  ? ? ? ? ? ? ? ? Plan - 03/24/22 1359   ? ? Clinical Impression Statement She was able to tolerate more exercise today but still very limited by this due to fatigue and nausea. We did DN her lumbar today to decrease overall pain. PT recommending to continue POC.   ? Personal Factors and Comorbidities Comorbidity 3+   ? Comorbidities anxiety, breast cancer with lumpectomy and radioactive seed placement 2021 and recent port removal, chronic pain, concussion 2008, HTN, depression, spinal fusion 2012   ? Examination-Activity Limitations Locomotion Level;Reach Overhead;Dressing;Hygiene/Grooming;Lift;Toileting;Bathing   ? Examination-Participation Restrictions Cleaning;Meal Prep;Community Activity;Driving;Laundry   ? Stability/Clinical Decision Making Evolving/Moderate complexity   ? Rehab Potential Fair   ? PT Frequency 2x / week   ? PT Duration 4 weeks   ? PT Treatment/Interventions ADLs/Self Care Home Management;Therapeutic exercise;Patient/family education;Cryotherapy;DME Instruction;Ultrasound;Moist Heat;Functional mobility training;Therapeutic activities;Balance training;Neuromuscular re-education;Manual techniques;Vasopneumatic Device;Taping;Dry needling;Passive range of motion;Electrical Stimulation;Gait training   ? PT Next Visit Plan endurance, functional activity, balance, strength   ? PT Home Exercise Plan Access Code: [email or web address], added tandem balance and SLS   ? Consulted and Agree with Plan of Care Patient   ? ?  ?  ? ?  ? ? ?Patient will benefit from skilled therapeutic intervention in order to improve the following deficits and impairments:  Postural dysfunction, Decreased range of motion, Decreased knowledge of precautions, Increased edema, Impaired UE functional use, Decreased strength, Decreased mobility, Decreased balance, Pain, Difficulty walking, Decreased activity tolerance, Decreased endurance ? ?Visit Diagnosis: ?Other abnormalities of gait and  mobility ? ?Muscle weakness (generalized) ? ?Acute pain of right knee ? ?Chronic bilateral low back pain without sciatica ? ?Difficulty in walking, not elsewhere classified ? ? ? ? ?Problem List ?Patient Activ

## 2022-03-29 ENCOUNTER — Encounter: Payer: Self-pay | Admitting: Physical Therapy

## 2022-03-29 ENCOUNTER — Ambulatory Visit (INDEPENDENT_AMBULATORY_CARE_PROVIDER_SITE_OTHER): Payer: Medicare PPO | Admitting: Physical Therapy

## 2022-03-29 ENCOUNTER — Other Ambulatory Visit: Payer: Self-pay | Admitting: Surgical

## 2022-03-29 DIAGNOSIS — M6281 Muscle weakness (generalized): Secondary | ICD-10-CM

## 2022-03-29 DIAGNOSIS — M25561 Pain in right knee: Secondary | ICD-10-CM | POA: Diagnosis not present

## 2022-03-29 DIAGNOSIS — R262 Difficulty in walking, not elsewhere classified: Secondary | ICD-10-CM | POA: Diagnosis not present

## 2022-03-29 DIAGNOSIS — M84361A Stress fracture, right tibia, initial encounter for fracture: Secondary | ICD-10-CM

## 2022-03-29 DIAGNOSIS — R2689 Other abnormalities of gait and mobility: Secondary | ICD-10-CM

## 2022-03-29 NOTE — Therapy (Addendum)
[facility] [address] [place], [place], [address] Phone: [phone]   Fax:  [phone]  Physical Therapy Treatment/Discharge addendum PHYSICAL THERAPY DISCHARGE SUMMARY  Visits from Start of Care: 68  Current functional level related to goals / functional outcomes: See below   Remaining deficits: See below   Education / Equipment: HEP  Plan:  Patient goals were partially met. Patient is being discharged due to not returning since last visit to follow up with MD for possible surgical intervention  Elsie Ra, PT, DPT 06/23/22 10:07 AM      Patient Details  Name: [name] MRN: [id number] Date of Birth: 05-Feb-1951 Referring Provider (PT): Donella Stade, [place]   Encounter Date: 03/29/2022   PT End of Session - 03/29/22 1334     Visit Number 41    Number of Visits 48    Date for PT Re-Evaluation 04/20/22    Authorization Type Humana    Authorization Time Period from 3/9 to 04/20/22    Authorization - Visit Number 5    Authorization - Number of Visits 12    Progress Note Due on Visit 64    PT Start Time 1300    PT Stop Time 1345    PT Time Calculation (min) 45 min    Activity Tolerance Patient tolerated treatment well    Behavior During Therapy [facility] for tasks assessed/performed             Past Medical History:  Diagnosis Date   Anxiety    Atrial fibrillation [facility])    sees Dr. Audie Box   Cancer [facility])    breast   Chronic pain    Concussion 02/2007   ICU x 3 days   Depression    Hypertension    Hypothyroidism    Personal history of chemotherapy    Personal history of radiation therapy    Spinal stenosis    Thyroid disease ?1994    Past Surgical History:  Procedure Laterality Date   BREAST BIOPSY Right 12/20/2019   x2   BREAST LUMPECTOMY Right 01/22/2020   BREAST LUMPECTOMY WITH RADIOACTIVE SEED AND SENTINEL LYMPH NODE BIOPSY Right 01/22/2020   Procedure: RIGHT BREAST LUMPECTOMY WITH RADIOACTIVE  SEED X2 AND RIGHT SENTINEL LYMPH NODE MAPPING;  Surgeon: Erroll Luna, MD;  Location: [place];  Service: General;  Laterality: Right;   BREAST SURGERY Right 03/1998   breast biopsy, benign   EYE SURGERY Right    PORT-A-CATH REMOVAL Right 04/30/2021   Procedure: REMOVAL PORT-A-CATH;  Surgeon: Erroll Luna, MD;  Location: [place];  Service: General;  Laterality: Right;   PORTACATH PLACEMENT Right 01/22/2020   Procedure: INSERTION PORT-A-CATH WITH ULTRASOUND;  Surgeon: Erroll Luna, MD;  Location: [place];  Service: General;  Laterality: Right;   PORTACATH PLACEMENT Right 02/28/2020   Procedure: PORT A CATH REVISION;  Surgeon: Erroll Luna, MD;  Location: [place];  Service: General;  Laterality: Right;   REVERSE SHOULDER ARTHROPLASTY Left 02/02/2021   Procedure: LEFT REVERSE SHOULDER ARTHROPLASTY;  Surgeon: Meredith Pel, MD;  Location: [place];  Service: Orthopedics;  Laterality: Left;   SHOULDER SURGERY Right    SPINAL FUSION  03/04/2011   with ORIF    There were no vitals filed for this visit.   Subjective Assessment - 03/29/22 1341     Subjective relays she still feels overall sick and weak. She has not been able to each much. She is frustrated she has not heard  back from PCP and oncologist. She requests DN today to help with her back pain.    Pertinent History anxiety, breast cancer with lumpectomy and radioactive seed placement 2021 and recent port removal, chronic pain, concussion 2008, HTN, depression, spinal fusion 2012.    Patient Stated Goals improve gait and balance and back pain    Currently in Pain? Yes    Pain Score 8     Pain Location Back    Pain Orientation Left    Pain Descriptors / Indicators Aching    Pain Type Chronic pain    Pain Onset More than a month ago    Pain Onset More than a month ago              [facility] Adult PT Treatment/Exercise - 03/29/22 0001       Lumbar Exercises: Aerobic    Nustep L6 15 min with intervals each 30 sec of high/low intensity effort for functional endurance and leg strength      Lumbar Exercises: Seated   Other Seated Lumbar Exercises seated lumbar flexion stretches fwd and lateral 5 sec x10 ea      Manual Therapy   Manual therapy comments compression and skilled palpation during DN              Trigger Point Dry Needling - 03/29/22 0001     Consent Given? Yes    Education Handout Provided Previously provided    Muscles Treated Back/Hip Quadratus lumborum;Erector spinae;Lumbar multifidi    Dry Needling Comments good overall tolerance noted, no immediate adverse reaction, Estim today micro current with 100 frequency intensity to her tolerance                     PT Short Term Goals - 02/25/22 1334       PT SHORT TERM GOAL #1   Title independent with intial HEP    Baseline met 10/19 but continue to progress    Time 4    Period Weeks    Status Achieved    Target Date 10/08/21      PT SHORT TERM GOAL #2   Title improve TUG to less than 17 seconds    Baseline Met 10/19, will make new goal for this    Time 4    Period Weeks    Status Achieved    Target Date 10/08/21      PT SHORT TERM GOAL #3   Title Pt will improve TUG time to less than 13 seconds without AD to improve gait speed and balance.    Baseline had met with 10.45 on 11/2 but had 14 on 3/9    Time 4    Period Weeks    Status Revised    Target Date 03/25/22               PT Long Term Goals - 02/25/22 1335       PT LONG TERM GOAL #1   Title She will improve BERG balance score to 46 or more to show improved balance    Baseline had met on 11/2 when scored 51 but scored 47 on 3/9    Time 12    Period Weeks    Status Achieved    Target Date 12/03/21      PT LONG TERM GOAL #2   Title She will improve TUG score to <10 seconds  to show improved balance and walk speed.    Baseline improved to 11 sec  on 1/26 but regressed to 14 sec on 3/9    Time 12     Period Weeks    Status On-going    Target Date 04/20/22      PT LONG TERM GOAL #3   Title report back pain < 3/10 with activity for improved function    Baseline depends, can still get up to 5 at times    Status On-going    Target Date 04/20/22      PT LONG TERM GOAL #4   Title Improve bilat knee/hip strength to 5/5 MMT tested in sitting    Baseline now 4+ for hip and 5 for knee    Time 12    Period Weeks    Status On-going    Target Date 04/20/22      PT LONG TERM GOAL #5   Title improve FGA to at least 24/30 for improved mobility and balance    Baseline 18/24    Status On-going    Target Date 04/20/22      PT LONG TERM GOAL #6   Title -      PT LONG TERM GOAL #7   Title -                   Plan - 03/29/22 1349     Clinical Impression Statement Her activity tolerance is still limited by back pain and feeling nauseated. I asked if she needs to pause her PT until she feels better as this limits her PT sessions however she insists she should continue with PT for now. DN does appear to help with her back pain.    Personal Factors and Comorbidities Comorbidity 3+    Comorbidities anxiety, breast cancer with lumpectomy and radioactive seed placement 2021 and recent port removal, chronic pain, concussion 2008, HTN, depression, spinal fusion 2012    Examination-Activity Limitations Locomotion Level;Reach Overhead;Dressing;Hygiene/Grooming;Lift;Toileting;Bathing    Examination-Participation Restrictions Cleaning;Meal Prep;Community Activity;Driving;Laundry    Stability/Clinical Decision Making Evolving/Moderate complexity    Rehab Potential Fair    PT Frequency 2x / week    PT Duration 4 weeks    PT Treatment/Interventions ADLs/Self Care Home Management;Therapeutic exercise;Patient/family education;Cryotherapy;DME Instruction;Ultrasound;Moist Heat;Functional mobility training;Therapeutic activities;Balance training;Neuromuscular re-education;Manual  techniques;Vasopneumatic Device;Taping;Dry needling;Passive range of motion;Electrical Stimulation;Gait training    PT Next Visit Plan endurance, functional activity, balance, strength    PT Home Exercise Plan Access Code: [email or web address], added tandem balance and SLS    Consulted and Agree with Plan of Care Patient             Patient will benefit from skilled therapeutic intervention in order to improve the following deficits and impairments:  Postural dysfunction, Decreased range of motion, Decreased knowledge of precautions, Increased edema, Impaired UE functional use, Decreased strength, Decreased mobility, Decreased balance, Pain, Difficulty walking, Decreased activity tolerance, Decreased endurance  Visit Diagnosis: Other abnormalities of gait and mobility  Muscle weakness (generalized)  Acute pain of right knee  Difficulty in walking, not elsewhere classified     Problem List Patient Active Problem List   Diagnosis Date Noted   Arthritis of left shoulder region    S/P reverse total shoulder arthroplasty, 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   Vitamin D deficiency 01/15/2021   Vitamin B12 deficiency 01/15/2021   Primary insomnia 01/15/2021   Morbid obesity ([place]) 01/15/2021   Moderate recurrent major depression ([place]) 01/15/2021   Benzodiazepine dependence ([place]) 01/15/2021   Port-A-Cath in place 07/22/2020   Left leg cellulitis 04/17/2020   Cellulitis of left leg 04/17/2020  Hypokalemia 04/17/2020   Macrocytic anemia 04/17/2020   Anxiety 04/17/2020   Depression 04/17/2020   Chronic diarrhea 04/17/2020   Chemotherapy induced diarrhea 04/17/2020   Malignant neoplasm of upper-inner quadrant of right breast in female, estrogen receptor positive ([place]) 12/26/2019   Encephalopathy 02/02/2018   Hypothyroidism 02/02/2018   AKI (acute kidney injury) ([place]) 02/02/2018   Chronic back pain 02/02/2018   Alcohol abuse 02/02/2018   Weight gain 02/02/2018   Benign essential HTN 02/02/2018    Debbe Odea, PT,DPT 03/29/2022, 1:53 PM  [facility] [address] [place], [place], [address] Phone: [phone]   Fax:  [phone]  Name: [name] MRN: [id number] Date of Birth: 1951-10-25

## 2022-03-30 ENCOUNTER — Other Ambulatory Visit: Payer: Self-pay | Admitting: Orthopedic Surgery

## 2022-03-30 DIAGNOSIS — M84361A Stress fracture, right tibia, initial encounter for fracture: Secondary | ICD-10-CM

## 2022-03-31 ENCOUNTER — Encounter: Payer: Medicare PPO | Admitting: Physical Therapy

## 2022-03-31 ENCOUNTER — Telehealth: Payer: Self-pay | Admitting: Physical Therapy

## 2022-03-31 NOTE — Telephone Encounter (Addendum)
Pt did not show for PT appointment today. They were contacted and she called back to state that she had called a left voice messages that she would not be able to attend her session due to illness. She will follow up with PCP and she will call back to schedule more PT once she feels better.  ? ? ?Elsie Ra, PT, DPT ?03/31/22 1:33 PM ? ?

## 2022-04-01 ENCOUNTER — Telehealth: Payer: Self-pay | Admitting: Orthopedic Surgery

## 2022-04-01 NOTE — Telephone Encounter (Signed)
Pt called for a refill on tramadol  ? ?VZ [phone]  ?

## 2022-04-02 NOTE — Telephone Encounter (Signed)
I left voicemail advising. ?

## 2022-04-02 NOTE — Telephone Encounter (Signed)
Refill sent in earlier today.

## 2022-04-05 ENCOUNTER — Encounter: Payer: Medicare PPO | Admitting: Physical Therapy

## 2022-04-07 ENCOUNTER — Encounter: Payer: Medicare PPO | Admitting: Physical Therapy

## 2022-04-08 ENCOUNTER — Other Ambulatory Visit: Payer: Self-pay

## 2022-04-08 ENCOUNTER — Encounter (HOSPITAL_COMMUNITY): Payer: Self-pay | Admitting: Emergency Medicine

## 2022-04-08 ENCOUNTER — Telehealth: Payer: Self-pay | Admitting: Cardiovascular Disease

## 2022-04-08 ENCOUNTER — Emergency Department (HOSPITAL_COMMUNITY)
Admission: EM | Admit: 2022-04-08 | Discharge: 2022-04-08 | Disposition: A | Discharge status: Home / Self Care | Payer: Medicare PPO | Attending: Emergency Medicine | Admitting: Emergency Medicine

## 2022-04-08 ENCOUNTER — Emergency Department (HOSPITAL_COMMUNITY): Payer: Medicare PPO

## 2022-04-08 ENCOUNTER — Telehealth: Payer: Self-pay | Admitting: Surgical

## 2022-04-08 DIAGNOSIS — D649 Anemia, unspecified: Secondary | ICD-10-CM | POA: Insufficient documentation

## 2022-04-08 DIAGNOSIS — J810 Acute pulmonary edema: Secondary | ICD-10-CM | POA: Diagnosis not present

## 2022-04-08 DIAGNOSIS — Z853 Personal history of malignant neoplasm of breast: Secondary | ICD-10-CM | POA: Insufficient documentation

## 2022-04-08 DIAGNOSIS — E039 Hypothyroidism, unspecified: Secondary | ICD-10-CM | POA: Diagnosis not present

## 2022-04-08 DIAGNOSIS — M9983 Other biomechanical lesions of lumbar region: Secondary | ICD-10-CM | POA: Diagnosis not present

## 2022-04-08 DIAGNOSIS — R7989 Other specified abnormal findings of blood chemistry: Secondary | ICD-10-CM | POA: Insufficient documentation

## 2022-04-08 DIAGNOSIS — R609 Edema, unspecified: Secondary | ICD-10-CM | POA: Diagnosis not present

## 2022-04-08 DIAGNOSIS — Z7901 Long term (current) use of anticoagulants: Secondary | ICD-10-CM | POA: Diagnosis not present

## 2022-04-08 DIAGNOSIS — I4891 Unspecified atrial fibrillation: Secondary | ICD-10-CM | POA: Diagnosis not present

## 2022-04-08 DIAGNOSIS — R0602 Shortness of breath: Secondary | ICD-10-CM | POA: Diagnosis not present

## 2022-04-08 DIAGNOSIS — M48061 Spinal stenosis, lumbar region without neurogenic claudication: Secondary | ICD-10-CM | POA: Diagnosis not present

## 2022-04-08 DIAGNOSIS — Z20822 Contact with and (suspected) exposure to covid-19: Secondary | ICD-10-CM | POA: Diagnosis not present

## 2022-04-08 DIAGNOSIS — R457 State of emotional shock and stress, unspecified: Secondary | ICD-10-CM | POA: Diagnosis not present

## 2022-04-08 DIAGNOSIS — Z79899 Other long term (current) drug therapy: Secondary | ICD-10-CM | POA: Diagnosis not present

## 2022-04-08 DIAGNOSIS — M545 Low back pain, unspecified: Secondary | ICD-10-CM | POA: Diagnosis not present

## 2022-04-08 DIAGNOSIS — M549 Dorsalgia, unspecified: Secondary | ICD-10-CM | POA: Diagnosis not present

## 2022-04-08 LAB — BASIC METABOLIC PANEL
Anion gap: 7 (ref 5–15)
BUN: 13 mg/dL (ref 8–23)
CO2: 29 mmol/L (ref 22–32)
Calcium: 8.6 mg/dL — ABNORMAL LOW (ref 8.9–10.3)
Chloride: 105 mmol/L (ref 98–111)
Creatinine, Ser: 0.87 mg/dL (ref 0.44–1.00)
GFR, Estimated: >60 mL/min (ref >60)
Glucose, Bld: 118 mg/dL — ABNORMAL HIGH (ref 70–99)
Potassium: 4 mmol/L (ref 3.5–5.1)
Sodium: 141 mmol/L (ref 135–145)

## 2022-04-08 LAB — CBC WITH DIFFERENTIAL/PLATELET
Abs Immature Granulocytes: 0.02 10*3/uL (ref 0.00–0.07)
Basophils Absolute: 0 10*3/uL (ref 0.0–0.1)
Basophils Relative: 0 %
Eosinophils Absolute: 0.1 10*3/uL (ref 0.0–0.5)
Eosinophils Relative: 2 %
HCT: 35.8 % — ABNORMAL LOW (ref 36.0–46.0)
Hemoglobin: 11.3 g/dL — ABNORMAL LOW (ref 12.0–15.0)
Immature Granulocytes: 0 %
Lymphocytes Relative: 18 %
Lymphs Abs: 1.1 10*3/uL (ref 0.7–4.0)
MCH: 32.7 pg (ref 26.0–34.0)
MCHC: 31.6 g/dL (ref 30.0–36.0)
MCV: 103.5 fL — ABNORMAL HIGH (ref 80.0–100.0)
Monocytes Absolute: 0.6 10*3/uL (ref 0.1–1.0)
Monocytes Relative: 9 %
Neutro Abs: 4.4 10*3/uL (ref 1.7–7.7)
Neutrophils Relative %: 71 %
Platelets: 175 10*3/uL (ref 150–400)
RBC: 3.46 MIL/uL — ABNORMAL LOW (ref 3.87–5.11)
RDW: 14.1 % (ref 11.5–15.5)
WBC: 6.3 10*3/uL (ref 4.0–10.5)
nRBC: 0 % (ref 0.0–0.2)

## 2022-04-08 LAB — URINALYSIS, ROUTINE W REFLEX MICROSCOPIC
Bilirubin Urine: NEGATIVE
Glucose, UA: NEGATIVE mg/dL
Hgb urine dipstick: NEGATIVE
Ketones, ur: NEGATIVE mg/dL
Leukocytes,Ua: NEGATIVE
Nitrite: NEGATIVE
Protein, ur: NEGATIVE mg/dL
Specific Gravity, Urine: 1.008 (ref 1.005–1.030)
pH: 7 (ref 5.0–8.0)

## 2022-04-08 LAB — RESP PANEL BY RT-PCR (FLU A&B, COVID) ARPGX2
Influenza A by PCR: NEGATIVE
Influenza B by PCR: NEGATIVE
SARS Coronavirus 2 by RT PCR: NEGATIVE

## 2022-04-08 LAB — BRAIN NATRIURETIC PEPTIDE: B Natriuretic Peptide: 103.5 pg/mL — ABNORMAL HIGH (ref 0.0–100.0)

## 2022-04-08 LAB — TSH: TSH: 4.533 u[IU]/mL — ABNORMAL HIGH (ref 0.350–4.500)

## 2022-04-08 MED ORDER — MORPHINE SULFATE (PF) 4 MG/ML IV SOLN
4.0000 mg | Freq: Once | INTRAVENOUS | Status: AC
  Administered 2022-04-08: 4 mg via INTRAVENOUS
  Filled 2022-04-08: qty 1

## 2022-04-08 MED ORDER — ONDANSETRON HCL 4 MG/2ML IJ SOLN
4.0000 mg | Freq: Once | INTRAMUSCULAR | Status: AC
  Administered 2022-04-08: 4 mg via INTRAVENOUS
  Filled 2022-04-08: qty 2

## 2022-04-08 MED ORDER — METHOCARBAMOL 500 MG PO TABS: 500.0000 mg | ORAL_TABLET | Freq: Two times a day (BID) | ORAL | 0 refills | Status: DC

## 2022-04-08 MED ORDER — FUROSEMIDE 10 MG/ML IJ SOLN
20.0000 mg | Freq: Once | INTRAMUSCULAR | Status: AC
  Administered 2022-04-08: 20 mg via INTRAVENOUS
  Filled 2022-04-08: qty 2

## 2022-04-08 MED ORDER — HYDROCODONE-ACETAMINOPHEN 5-325 MG PO TABS: 1.0000 | ORAL_TABLET | ORAL | 0 refills | Status: DC | PRN

## 2022-04-08 NOTE — ED Triage Notes (Signed)
Pt BIB EMS from home called out for ShOB, anxious upon EMS arrival. 92% upon fire arrival placed on 2L [place]. 98% with EMS on RA. Pt c/o inceasing ShOB and bil leg swelling with difficulty ambulating, reports needing husband to assist with walking.  ?

## 2022-04-08 NOTE — Telephone Encounter (Signed)
Patient called. She is in a lot of pain. Would like hydrocodone called in for her pain. Says it is unbearable.  ?

## 2022-04-08 NOTE — Telephone Encounter (Signed)
Tried calling patient to advise. No answer. LMVM to [facility] to discuss ?

## 2022-04-08 NOTE — Telephone Encounter (Signed)
Needs appointment/ eval before prescription of hydrocodone. I can refill tramadol in the meantime

## 2022-04-08 NOTE — ED Provider Notes (Signed)
?[facility] ?Provider Note ? ? ?CSN: [id number] ?Arrival date & time: 04/08/22  1016 ? ?  ? ?History ? ?Chief Complaint  ?Patient presents with  ? Shortness of Breath  ? ? ?[name] is a 71 y.o. female. ? ?Pt is a 71 yo female with a pmhx significant for breast cancer s/p right lumpectomy and radiation, hypothyroidism, obesity, persistent afib on eliquis, and chronic back pain.  Pt has had low back pain for months.  She has seen Dr. Marlou Sa (ortho) for the pain.  She has been on Tramadol, but it is no longer helping.  She has been having worsening of pain and also has more difficulty walking.  Pt called EMS today with sob, but sx seem to be more of a pain issue.  Pt does say that she's had a cough, but no fever. ? ? ?  ? ?Home Medications ?Prior to Admission medications   ?Medication Sig Start Date End Date Taking? Authorizing Provider  ?acetaminophen (TYLENOL) 500 MG tablet Take 1,000 mg by mouth every 6 (six) hours as needed for mild pain.   Yes [provider]  ?amitriptyline (ELAVIL) 25 MG tablet Take 25-75 mg by mouth at bedtime as needed for sleep.   Yes [provider]  ?amLODipine (NORVASC) 5 MG tablet Take 7.5 mg by mouth daily in the afternoon.   Yes [provider]  ?anastrozole (ARIMIDEX) 1 MG tablet Take 1 tablet (1 mg total) by mouth daily. 04/13/21  Yes Nicholas Lose, MD  ?apixaban (ELIQUIS) 5 MG TABS tablet TAKE ONE TABLET BY MOUTH TWICE DAILY. ?Patient taking differently: Take 5 mg by mouth 2 (two) times daily. 07/30/21  Yes O'Neal, Cassie Freer, MD  ?Calcium-Magnesium-Zinc 500-250-12.5 MG TABS Take 1 tablet by mouth daily.   Yes [provider]  ?Cholecalciferol (VITAMIN D) 125 MCG (5000 UT) CAPS Take 5,000 Units by mouth daily.   Yes [provider]  ?diazepam (VALIUM) 10 MG tablet TAKE ONE TABLET BY MOUTH AT BEDTIME if need for sleep ?Patient taking differently: Take 10 mg by mouth at bedtime as needed for  sleep. 01/25/22  Yes Nicholas Lose, MD  ?furosemide (LASIX) 20 MG tablet Take 20-40 mg by mouth daily as needed for fluid or edema.   Yes [provider]  ?HYDROcodone-acetaminophen (NORCO/VICODIN) 5-325 MG tablet Take 1 tablet by mouth every 4 (four) hours as needed. 04/08/22  Yes Isla Pence, MD  ?levothyroxine (SYNTHROID) 150 MCG tablet Take 150 mcg by mouth daily in the afternoon. 12/28/20  Yes [provider]  ?methocarbamol (ROBAXIN) 500 MG tablet Take 1 tablet (500 mg total) by mouth 2 (two) times daily. 04/08/22  Yes Isla Pence, MD  ?metoprolol tartrate (LOPRESSOR) 25 MG tablet Take 1 tablet (25 mg total) by mouth 2 (two) times daily. 01/28/21 04/08/22 Yes O'Neal, Cassie Freer, MD  ?potassium chloride (KLOR-CON) 10 MEQ tablet Take 10 mEq by mouth daily. May take 1 additional tablet ( 10 MeQ) if taking lasix. 03/11/22  Yes [provider]  ?venlafaxine XR (EFFEXOR-XR) 150 MG 24 hr capsule Take 150 mg by mouth daily in the afternoon. 09/11/20  Yes [provider]  ?vitamin B-12 (CYANOCOBALAMIN) 1000 MCG tablet Take 1,000 mcg by mouth daily in the afternoon.   Yes [provider]  ?traMADol (ULTRAM) 50 MG tablet TAKE ONE TABLET BY MOUTH EVERY TWELVE HOURS 04/08/22   Magnant, Gerrianne Scale, PA-C  ?   ? ?Allergies    ?Ambien [zolpidem tartrate]   ? ?  Review of Systems   ?Review of Systems  ?Musculoskeletal:  Positive for back pain.  ?All other systems reviewed and are negative. ? ?Physical Exam ?Updated Vital Signs ?BP (!) 146/75   Pulse 99   Temp 98 ?F (36.7 ?C) (Oral)   Resp (!) 25   Ht '5\' 7"'$  (1.702 m)   Wt 106.3 kg   LMP  (LMP Unknown)   SpO2 96%   BMI 36.70 kg/m?  ?Physical Exam ?Vitals and nursing note reviewed.  ?Constitutional:   ?   Appearance: She is well-developed. She is obese.  ?HENT:  ?   Head: Normocephalic and atraumatic.  ?   Mouth/Throat:  ?   Mouth: Mucous membranes are moist.  ?Eyes:  ?   Pupils: Pupils are equal, round, and reactive to light.   ?Cardiovascular:  ?   Rate and Rhythm: Normal rate. Rhythm irregular.  ?Pulmonary:  ?   Effort: Pulmonary effort is normal.  ?   Breath sounds: Normal breath sounds.  ?Musculoskeletal:     ?   General: Normal range of motion.  ?   Cervical back: Normal range of motion and neck supple.  ?   Right lower leg: Edema present.  ?   Left lower leg: Edema present.  ?Skin: ?   General: Skin is warm.  ?   Capillary Refill: Capillary refill takes less than 2 seconds.  ?Neurological:  ?   General: No focal deficit present.  ?   Mental Status: She is alert and oriented to person, place, and time.  ?Psychiatric:     ?   Mood and Affect: Mood normal.     ?   Behavior: Behavior normal.  ? ? ?ED Results / Procedures / Treatments   ?Labs ?(all labs ordered are listed, but only abnormal results are displayed) ?Labs Reviewed  ?BASIC METABOLIC PANEL - Abnormal; Notable for the following components:  ?    Result Value  ? Glucose, Bld 118 (*)   ? Calcium 8.6 (*)   ? All other components within normal limits  ?CBC WITH DIFFERENTIAL/PLATELET - Abnormal; Notable for the following components:  ? RBC 3.46 (*)   ? Hemoglobin 11.3 (*)   ? HCT 35.8 (*)   ? MCV 103.5 (*)   ? All other components within normal limits  ?BRAIN NATRIURETIC PEPTIDE - Abnormal; Notable for the following components:  ? B Natriuretic Peptide 103.5 (*)   ? All other components within normal limits  ?TSH - Abnormal; Notable for the following components:  ? TSH 4.533 (*)   ? All other components within normal limits  ?URINALYSIS, ROUTINE W REFLEX MICROSCOPIC - Abnormal; Notable for the following components:  ? Color, Urine STRAW (*)   ? All other components within normal limits  ?RESP PANEL BY RT-PCR (FLU A&B, COVID) ARPGX2  ? ? ?EKG ?EKG Interpretation ? ?Date/Time:  Thursday April 08 2022 10:34:23 EDT ?Ventricular Rate:  61 ?PR Interval:    ?QRS Duration: 73 ?QT Interval:  431 ?QTC Calculation: 435 ?R Axis:   7 ?Text Interpretation: Atrial fibrillation No significant  change since last tracing Confirmed by Isla Pence [phone]) on 04/08/2022 10:36:01 AM ? ?Radiology ?CT Lumbar Spine Wo Contrast ? ?Result Date: 04/08/2022 ?CLINICAL DATA:  Low back pain. EXAM: CT LUMBAR SPINE WITHOUT CONTRAST TECHNIQUE: Multidetector CT imaging of the lumbar spine was performed without intravenous contrast administration. Multiplanar CT image reconstructions were also generated. RADIATION DOSE REDUCTION: This exam was performed according to the departmental dose-optimization program which  includes automated exposure control, adjustment of the mA and/or kV according to patient size and/or use of iterative reconstruction technique. COMPARISON:  CT chest, abdomen, and pelvis 04/08/2021. CT abdomen and pelvis 04/16/2020. Lumbar spine MRI 07/12/2018. FINDINGS: Segmentation: 5 lumbar type vertebrae. Alignment: Mild scoliosis and trace chronic retrolisthesis of L1 on L2 and L2 on L3. Vertebrae: No acute fracture or suspicious osseous lesion. Prior L4-5 posterior and interbody fusion with solid arthrodesis. Paraspinal and other soft tissues: 2.1 cm intermediate attenuation left adrenal mass, unchanged from 2021. Disc levels: Vacuum disc and degenerative endplate changes from K0-8 to L3-4 with severe disc space narrowing at L1-2 and L2-3 and mild narrowing at L3-4. T11-12 and T12-L1: Unremarkable. L1-2: Retrolisthesis with bulging uncovered disc and mild facet hypertrophy without evidence of significant stenosis, similar to the prior MRI. L2-3: Retrolisthesis with left eccentric bulging of uncovered disc and mild-to-moderate facet hypertrophy result in mild left lateral recess stenosis and mild left neural foraminal stenosis without significant spinal stenosis, similar to the prior MRI. L3-4: Left eccentric disc bulging, moderate facet and ligamentum flavum hypertrophy, and prominent dorsal epidural fat result in mild spinal stenosis and minimal to mild right and moderate left neural foraminal stenosis,  progressed from the prior MRI. L4-5: Prior posterior decompression and fusion.  No stenosis. L5-S1: Mild right and moderate left facet arthrosis without evidence of stenosis. IMPRESSION: 1. No acute osseous abnormality. 2. A

## 2022-04-08 NOTE — ED Notes (Signed)
Pt was able to ambulate with SPO2 remaining at 96%  ?

## 2022-04-08 NOTE — Discharge Instructions (Addendum)
Take the lasix daily. Return if worse. ?

## 2022-04-08 NOTE — Telephone Encounter (Signed)
Pt c/o swelling: STAT is pt has developed SOB within 24 hours ? ?How much weight have you gained and in what time span?  ?Patient assumed she has gained weight, but she does not know how much. She states she has gained "a lot" ? ?If swelling, where is the swelling located?  ?Legs/feets ? ?Are you currently taking a fluid pill?  ?Yes, patient takes Lasix  ? ?Are you currently SOB?  ?Not currently, but she states she has been more SOB recently ? ?Do you have a log of your daily weights (if so, list)?  ?No log available  ? ?Have you gained 3 pounds in a day or 5 pounds in a week?  ? ? ?Have you traveled recently?  ?No  ? ?

## 2022-04-08 NOTE — ED Notes (Signed)
Patient verbalizes understanding of discharge instructions. Opportunity for questioning and answers were provided. Armband removed by staff, pt discharged from ED via wheelchair to lobby to return home with family.  

## 2022-04-08 NOTE — Telephone Encounter (Signed)
Pt states she has been having issues with SOB for some time.  Went to ED today for SOB and back pain.  CXR were suggestive of CHF.  Was given IV Lasix and approximately 1L of fluid was removed.  States she is feeling much better.  Pt states that the ED MD recommended that she f/u with cardiology.  I see where the ER doc recommended possibly repeating the echo.  Advised I will send message to Dr. Audie Box to review ER notes and give recommendations.  ?

## 2022-04-09 NOTE — Telephone Encounter (Signed)
Thank you :)

## 2022-04-09 NOTE — Telephone Encounter (Signed)
She needs a hospital follow-up to sort out. Please schedule this with me or an APP next available.    ?  ?[place] T. Audie Box, MD, [facility]  ? ?

## 2022-04-09 NOTE — Telephone Encounter (Signed)
Appointment made with Caron Presume, PA-C for 04/15/2022. ?

## 2022-04-12 DIAGNOSIS — D6869 Other thrombophilia: Secondary | ICD-10-CM | POA: Diagnosis not present

## 2022-04-12 DIAGNOSIS — G894 Chronic pain syndrome: Secondary | ICD-10-CM | POA: Diagnosis not present

## 2022-04-12 DIAGNOSIS — F331 Major depressive disorder, recurrent, moderate: Secondary | ICD-10-CM | POA: Diagnosis not present

## 2022-04-12 DIAGNOSIS — K529 Noninfective gastroenteritis and colitis, unspecified: Secondary | ICD-10-CM | POA: Diagnosis not present

## 2022-04-12 DIAGNOSIS — I4819 Other persistent atrial fibrillation: Secondary | ICD-10-CM | POA: Diagnosis not present

## 2022-04-14 ENCOUNTER — Encounter: Payer: Self-pay | Admitting: Surgical

## 2022-04-14 ENCOUNTER — Telehealth: Payer: Self-pay

## 2022-04-14 ENCOUNTER — Ambulatory Visit (INDEPENDENT_AMBULATORY_CARE_PROVIDER_SITE_OTHER): Payer: Medicare PPO | Admitting: Surgical

## 2022-04-14 DIAGNOSIS — K529 Noninfective gastroenteritis and colitis, unspecified: Secondary | ICD-10-CM | POA: Diagnosis not present

## 2022-04-14 DIAGNOSIS — M545 Low back pain, unspecified: Secondary | ICD-10-CM

## 2022-04-14 MED ORDER — OXYCODONE-ACETAMINOPHEN 5-325 MG PO TABS: 1.0000 | ORAL_TABLET | Freq: Two times a day (BID) | ORAL | 0 refills | Status: DC | PRN

## 2022-04-14 NOTE — Progress Notes (Signed)
? ?Office Visit Note ?  ?Patient: [name]           ?Date of Birth: 1951-10-11           ?MRN: [id number] ?Visit Date: 04/14/2022 ?Requested by: Maurice Small, MD ?[address] ?[address] ?[place],  [place] [address] ?PCP: Jonathon Jordan, MD ? ?Subjective: ?Chief Complaint  ?Patient presents with  ? Lower Back - Pain  ? ? ?HPI: [name] is a 71 y.o. female who presents to the office complaining of low back pain.  She has severe low back pain without any radicular pain down either leg.  She notes no numbness or tingling.  She has severe pain in the low back pain along the axial lumbar spine as well as the left and right paraspinal regions, more so on the left.  Pain wakes her up and makes her generally miserable throughout the day.  It is much worse than when she saw Dr. Marlou Sa in January 2023.  Tramadol that she has previously been taking is not helping.  She had to go to the emergency department the other day where she had CT scan demonstrating no acute abnormality but did show severe disc degenerative endplate changes at X9-J4, L2-L3, L3-L4.  She has history of prior L-spine fusion by Dr. Arnoldo Morale but is very afraid of surgery and is not interested in pursuing surgery. ? ?Secondary complaint is diarrhea that she notes 7 times per day with loose bowel movements.  This has been going on for 2 to 3 months without improvement.  She denies any blood in the stool or melena.  Sounds more like functional incontinence based on her description that she feels a sudden urge and occasionally does not make it to the bathroom in time and has a bowel movement accident.  No urinary incontinence.  No saddle anesthesia. ?             ?ROS: All systems reviewed are negative as they relate to the chief complaint within the history of present illness.  Patient denies fevers or chills. ? ?Assessment & Plan: ?Visit Diagnoses:  ?1. Low back pain, unspecified back pain laterality, unspecified chronicity, unspecified  whether sciatica present   ?2. Chronic diarrhea   ? ? ?Plan: Patient is a 71 year old female who presents with severe low back pain that is worse in the last several months.  No radicular component or weakness on exam today.  Physical therapy has stopped providing any relief for her and she states she has had lumbar spine ESI's without any relief in the past.  Discussed the options available to patient and recommended evaluation by spine surgeon or neurosurgeon.  Loann is adamant that she does not want to pursue surgical intervention at this time and so the only real option left is either to live with the pain or to pursue pain management.  She would like to go this route so referred her to pain management.  Prescribed one-time prescription of oxycodone to help with pain as she gets established with pain management but this will not be refilled for her.  She understands this. ? ?Patient also complains of diarrhea over the last 2 to 3 months.  This is causing her significant distress, almost as much as the severe back pain she is experiencing.  She would like to discuss her symptoms further with a gastroenterologist.  Plan to refer her to GI per her request. ? ?Follow-Up Instructions: No follow-ups on file.  ? ?Orders:  ?  Orders Placed This Encounter  ?Procedures  ? Ambulatory referral to Gastroenterology  ? Ambulatory referral to Pain Clinic  ? ?No orders of the defined types were placed in this encounter. ? ? ? ? Procedures: ?No procedures performed ? ? ?Clinical Data: ?No additional findings. ? ?Objective: ?Vital Signs: LMP  (LMP Unknown)  ? ?Physical Exam:  ?Constitutional: Patient appears well-developed ?HEENT:  ?Head: Normocephalic ?Eyes:EOM are normal ?Neck: Normal range of motion ?Cardiovascular: Normal rate ?Pulmonary/chest: Effort normal ?Neurologic: Patient is alert ?Skin: Skin is warm ?Psychiatric: Patient has normal mood and affect ? ?Ortho Exam: Ortho exam demonstrates negative straight leg raise  bilaterally.  5/5 motor strength of bilateral hip flexion, quadricep, hamstring, dorsiflexion, plantarflexion when patient puts forth effort.  Severe tenderness throughout the axial lumbar spine.  Pain worse with flexion extension of the spine. ? ?Specialty Comments:  ?No specialty comments available. ? ?Imaging: ?No results found. ? ? ?PMFS History: ?Patient Active Problem List  ? Diagnosis Date Noted  ? Arthritis of left shoulder region   ? S/P reverse total shoulder arthroplasty, 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  ? Vitamin D deficiency 01/15/2021  ? Vitamin B12 deficiency 01/15/2021  ? Primary insomnia 01/15/2021  ? Morbid obesity ([place]) 01/15/2021  ? Moderate recurrent major depression ([place]) 01/15/2021  ? Benzodiazepine dependence ([place]) 01/15/2021  ? Port-A-Cath in place 07/22/2020  ? Left leg cellulitis 04/17/2020  ? Cellulitis of left leg 04/17/2020  ? Hypokalemia 04/17/2020  ? Macrocytic anemia 04/17/2020  ? Anxiety 04/17/2020  ? Depression 04/17/2020  ? Chronic diarrhea 04/17/2020  ? Chemotherapy induced diarrhea 04/17/2020  ? Malignant neoplasm of upper-inner quadrant of right breast in female, estrogen receptor positive ([place]) 12/26/2019  ? Encephalopathy 02/02/2018  ? Hypothyroidism 02/02/2018  ? AKI (acute kidney injury) ([place]) 02/02/2018  ? Chronic back pain 02/02/2018  ? Alcohol abuse 02/02/2018  ? Weight gain 02/02/2018  ? Benign essential HTN 02/02/2018  ? ?Past Medical History:  ?Diagnosis Date  ? Anxiety   ? Atrial fibrillation ([place])   ? sees Dr. Audie Box  ? Cancer [facility])   ? breast  ? Chronic pain   ? Concussion 02/2007  ? ICU x 3 days  ? Depression   ? Hypertension   ? Hypothyroidism   ? Personal history of chemotherapy   ? Personal history of radiation therapy   ? Spinal stenosis   ? Thyroid disease ?1994  ?  ?Family History  ?Problem Relation Age of Onset  ? Thyroid disease Mother   ? Dementia Mother   ? Diabetes Father   ? Stroke Father   ? Hypertension Sister   ? Diabetes Sister   ?  ?Past Surgical History:   ?Procedure Laterality Date  ? BREAST BIOPSY Right 12/20/2019  ? x2  ? BREAST LUMPECTOMY Right 01/22/2020  ? BREAST LUMPECTOMY WITH RADIOACTIVE SEED AND SENTINEL LYMPH NODE BIOPSY Right 01/22/2020  ? Procedure: RIGHT BREAST LUMPECTOMY WITH RADIOACTIVE SEED X2 AND RIGHT SENTINEL LYMPH NODE MAPPING;  Surgeon: Erroll Luna, MD;  Location: [facility];  Service: General;  Laterality: Right;  ? BREAST SURGERY Right 03/1998  ? breast biopsy, benign  ? EYE SURGERY Right   ? PORT-A-CATH REMOVAL Right 04/30/2021  ? Procedure: REMOVAL PORT-A-CATH;  Surgeon: Erroll Luna, MD;  Location: [facility];  Service: General;  Laterality: Right;  ? PORTACATH PLACEMENT Right 01/22/2020  ? Procedure: INSERTION PORT-A-CATH WITH ULTRASOUND;  Surgeon: Erroll Luna, MD;  Location: [facility];  Service: General;  Laterality: Right;  ?  PORTACATH PLACEMENT Right 02/28/2020  ? Procedure: PORT A CATH REVISION;  Surgeon: Erroll Luna, MD;  Location: [facility];  Service: General;  Laterality: Right;  ? REVERSE SHOULDER ARTHROPLASTY Left 02/02/2021  ? Procedure: LEFT REVERSE SHOULDER ARTHROPLASTY;  Surgeon: Meredith Pel, MD;  Location: [facility];  Service: Orthopedics;  Laterality: Left;  ? SHOULDER SURGERY Right   ? SPINAL FUSION  03/04/2011  ? with ORIF  ? ?Social History  ? ?Occupational History  ? 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 Smoking status: Former  ?  Years: 20.00  ?  Types: Cigarettes  ? Smokeless tobacco: Never  ?Vaping Use  ? Vaping Use: Never used  ?Substance and Sexual Activity  ? Alcohol use: Yes  ?  Alcohol/week: 5.0 standard drinks  ?  Types: 5 Standard drinks or equivalent per week  ?  Comment: wine  ? Drug use: No  ? Sexual activity: Not Currently  ?  Partners: Male  ?  Birth control/protection: Post-menopausal  ? ? ? ? ?  ?

## 2022-04-14 NOTE — Telephone Encounter (Signed)
Called patient to reschedule appointment. Patient requested call to reschedule. ?

## 2022-04-14 NOTE — Telephone Encounter (Signed)
Patient is currently at [facility] waiting for Rx to be sent over. CB# [phone].  Please advise.  Thank you. ?

## 2022-04-14 NOTE — Telephone Encounter (Signed)
[facility] sent medication in as soon as visit was completed.  ?

## 2022-04-15 ENCOUNTER — Ambulatory Visit: Payer: Medicare PPO | Admitting: Physician Assistant

## 2022-04-20 ENCOUNTER — Encounter: Payer: Self-pay | Admitting: Physician Assistant

## 2022-04-20 ENCOUNTER — Other Ambulatory Visit: Payer: Self-pay | Admitting: Hematology and Oncology

## 2022-04-21 ENCOUNTER — Ambulatory Visit (INDEPENDENT_AMBULATORY_CARE_PROVIDER_SITE_OTHER): Payer: Medicare PPO | Admitting: Physician Assistant

## 2022-04-21 ENCOUNTER — Encounter: Payer: Self-pay | Admitting: Physician Assistant

## 2022-04-21 VITALS — BP 128/70 | HR 81 | Ht 67.0 in | Wt 261.8 lb

## 2022-04-21 DIAGNOSIS — D649 Anemia, unspecified: Secondary | ICD-10-CM | POA: Diagnosis not present

## 2022-04-21 DIAGNOSIS — I4819 Other persistent atrial fibrillation: Secondary | ICD-10-CM

## 2022-04-21 DIAGNOSIS — I10 Essential (primary) hypertension: Secondary | ICD-10-CM

## 2022-04-21 DIAGNOSIS — Z7901 Long term (current) use of anticoagulants: Secondary | ICD-10-CM | POA: Diagnosis not present

## 2022-04-21 DIAGNOSIS — R0602 Shortness of breath: Secondary | ICD-10-CM | POA: Diagnosis not present

## 2022-04-21 DIAGNOSIS — R197 Diarrhea, unspecified: Secondary | ICD-10-CM

## 2022-04-21 MED ORDER — CARVEDILOL 6.25 MG PO TABS: 6.2500 mg | ORAL_TABLET | Freq: Two times a day (BID) | ORAL | 3 refills | Status: DC

## 2022-04-21 NOTE — Patient Instructions (Signed)
Medication Instructions:  ?Stop Amlodipine. ?Stop Metoprolol. ?Start Coreg 6.25 mg ( Take 1 Tablet Twice Daily). ?Increase Lasix to 60 mg ( For 3 Days). Then resume lasix 40 mg Daily. ? ?*If you need a refill on your cardiac medications before your next appointment, please call your pharmacy* ? ? ?Lab Work: ?No Labs ?If you have labs (blood work) drawn today and your tests are completely normal, you will receive your results only by: ?MyChart Message (if you have MyChart) OR ?A paper copy in the mail ?If you have any lab test that is abnormal or we need to change your treatment, we will call you to review the results. ? ? ?Testing/Procedures: [address]. ?Your physician has requested that you have an echocardiogram. Echocardiography is a painless test that uses sound waves to create images of your heart. It provides your doctor with information about the size and shape of your heart and how well your heart?s chambers and valves are working. This procedure takes approximately one hour. There are no restrictions for this procedure.  ? ? ?Follow-Up: ?At [facility], you and your health needs are our priority.  As part of our continuing mission to provide you with exceptional heart care, we have created designated Provider Care Teams.  These Care Teams include your primary Cardiologist (physician) and Advanced Practice Providers (APPs -  Physician Assistants and Nurse Practitioners) who all work together to provide you with the care you need, when you need it. ? ?We recommend signing up for the patient portal called "MyChart".  Sign up information is provided on this After Visit Summary.  MyChart is used to connect with patients for Virtual Visits (Telemedicine).  Patients are able to view lab/test results, encounter notes, upcoming appointments, etc.  Non-urgent messages can be sent to your provider as well.   ?To learn more about what you can do with MyChart, go to [email or web address].    ? ?Your next appointment:   ?1 month(s) ? ?The format for your next appointment:   ?In Person ? ?Provider:   ?Fabian Sharp, PA-C    Then, Evalina Field, MD will plan to see you again in 3 month(s).  ? ? ?Other Instructions ?Increase Protein ( Fairlife protein Shakes). Elevate feet when resting. Continue Low Sodium Diet. ? ?Important Information About Sugar ? ? ? ? ?  ?

## 2022-04-21 NOTE — Progress Notes (Signed)
?Cardiology Office Note:   ? ?Date:  04/21/2022  ? ?ID:  [name], DOB 1951-05-05, MRN [id number] ? ?PCP:  Jonathon Jordan, MD ?  ?[facility] Providers ?Cardiologist:  Evalina Field, MD ?Cardiology APP:  Ledora Bottcher, PA { ?Referring MD: Maurice Small, MD  ? ?Chief Complaint  ?Patient presents with  ? Follow-up  ?ER follow up , dyspnea ? ?History of Present Illness:   ? ?[name] is a 71 y.o. female with a hx of HTN, breast cancer s/p lumpectomy and radiation, hypothyroidism, obesity, persistent Afib on eliquis, and chronic back pain. She is a former smoker. She takes lasix intermittently for leg swelling after prior injury.  She established care with Dr. Audie Box 01/28/21 for evaluation of rate controlled Afib. She was generally asymptomatic. Low dose metoprolol was started. She was planning port removal and shoulder surgery, so [place] initiation was deferred. She was seen back 02/25/22 and eliquis started, ASA stopped. Echo was repeated and revealed preserved EF and no significant valvular disease. She was last seen by Dr. Audie Box in clinic 08/26/21 and was doing well with [place]. She called oru office 04/08/22 with weight gain, LE swelling, and SOB.   ? ?She presented to the ER 04/08/22 with these symptoms. CXR with mild interstitial edema, BNP 104.She was treated with 20 mg IV lasix x 1 with improvement in her symptoms, diuresed over 1 L. She was discharged without admission but close cardiology follow up.  ? ?She presents today for ER follow up. She has not been short of breath since leaving the ER. She continues to have B LE swelling. She is taking 40 mg lasix daily  and was doing so for 3 months leading up to her ER evaluation . She remains edematous, but not dyspneic.   ? ?She describes a weight gain over the last year of 50 lbs since having 1 year of chemo. She doesn't have a very good appetite, doesn't eat much, and also has chronic diarrhea with some incontinence. She also has chronic back pain  with cysts on her spine. I suspect she is malnourished with a low albumin. LE swelling has improved, but not resolved. She is no longer dyspneic. No chest pain, palpitations.  ? ?Past Medical History:  ?Diagnosis Date  ? Anxiety   ? Atrial fibrillation ([place])   ? sees Dr. Audie Box  ? Cancer [facility])   ? breast  ? Chronic pain   ? Concussion 02/2007  ? ICU x 3 days  ? Depression   ? Hypertension   ? Hypothyroidism   ? Personal history of chemotherapy   ? Personal history of radiation therapy   ? Spinal stenosis   ? Thyroid disease ?1994  ? ? ?Past Surgical History:  ?Procedure Laterality Date  ? BREAST BIOPSY Right 12/20/2019  ? x2  ? BREAST LUMPECTOMY Right 01/22/2020  ? BREAST LUMPECTOMY WITH RADIOACTIVE SEED AND SENTINEL LYMPH NODE BIOPSY Right 01/22/2020  ? Procedure: RIGHT BREAST LUMPECTOMY WITH RADIOACTIVE SEED X2 AND RIGHT SENTINEL LYMPH NODE MAPPING;  Surgeon: Erroll Luna, MD;  Location: [place];  Service: General;  Laterality: Right;  ? BREAST SURGERY Right 03/1998  ? breast biopsy, benign  ? EYE SURGERY Right   ? PORT-A-CATH REMOVAL Right 04/30/2021  ? Procedure: REMOVAL PORT-A-CATH;  Surgeon: Erroll Luna, MD;  Location: [place];  Service: General;  Laterality: Right;  ? PORTACATH PLACEMENT Right 01/22/2020  ? Procedure: INSERTION PORT-A-CATH WITH ULTRASOUND;  Surgeon: Erroll Luna,  MD;  Location: [place];  Service: General;  Laterality: Right;  ? PORTACATH PLACEMENT Right 02/28/2020  ? Procedure: PORT A CATH REVISION;  Surgeon: Erroll Luna, MD;  Location: [place];  Service: General;  Laterality: Right;  ? REVERSE SHOULDER ARTHROPLASTY Left 02/02/2021  ? Procedure: LEFT REVERSE SHOULDER ARTHROPLASTY;  Surgeon: Meredith Pel, MD;  Location: [place];  Service: Orthopedics;  Laterality: Left;  ? SHOULDER SURGERY Right   ? SPINAL FUSION  03/04/2011  ? with ORIF  ? ? ?Current Medications: ?Current Meds  ?Medication Sig  ? acetaminophen (TYLENOL)  500 MG tablet Take 1,000 mg by mouth every 6 (six) hours as needed for mild pain.  ? amitriptyline (ELAVIL) 25 MG tablet Take 25-75 mg by mouth at bedtime as needed for sleep.  ? anastrozole (ARIMIDEX) 1 MG tablet TAKE ONE TABLET BY MOUTH DAILY.  ? apixaban (ELIQUIS) 5 MG TABS tablet TAKE ONE TABLET BY MOUTH TWICE DAILY. (Patient taking differently: Take 5 mg by mouth 2 (two) times daily.)  ? Calcium-Magnesium-Zinc 500-250-12.5 MG TABS Take 1 tablet by mouth daily.  ? carvedilol (COREG) 6.25 MG tablet Take 1 tablet (6.25 mg total) by mouth 2 (two) times daily.  ? Cholecalciferol (VITAMIN D) 125 MCG (5000 UT) CAPS Take 5,000 Units by mouth daily.  ? furosemide (LASIX) 20 MG tablet Take 20-40 mg by mouth daily as needed for fluid or edema.  ? levothyroxine (SYNTHROID) 150 MCG tablet Take 150 mcg by mouth daily in the afternoon.  ? methocarbamol (ROBAXIN) 500 MG tablet Take 1 tablet (500 mg total) by mouth 2 (two) times daily.  ? oxyCODONE-acetaminophen (PERCOCET) 5-325 MG tablet Take 1 tablet by mouth every 12 (twelve) hours as needed for severe pain. Do not take with tramadol/hydrocodone.  Do not operate a motor vehicle while taking this medication  ? potassium chloride (KLOR-CON) 10 MEQ tablet Take 10 mEq by mouth daily. May take 1 additional tablet ( 10 MeQ) if taking lasix.  ? traMADol (ULTRAM) 50 MG tablet Take by mouth.  ? venlafaxine XR (EFFEXOR-XR) 150 MG 24 hr capsule Take 150 mg by mouth daily in the afternoon.  ? vitamin B-12 (CYANOCOBALAMIN) 1000 MCG tablet Take 1,000 mcg by mouth daily in the afternoon.  ? [DISCONTINUED] amLODipine (NORVASC) 5 MG tablet Take 7.5 mg by mouth daily in the afternoon.  ? [DISCONTINUED] metoprolol tartrate (LOPRESSOR) 25 MG tablet Take 1 tablet (25 mg total) by mouth 2 (two) times daily.  ?  ? ?Allergies:   Ambien [zolpidem tartrate]  ? ?Social History  ? ?Socioeconomic History  ? Marital status: Married  ?  Spouse name: Not on file  ? Number of children: 1  ? Years of  education: Not on file  ? Highest education level: Not on file  ?Occupational History  ? 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 Smoking status: Former  ?  Years: 20.00  ?  Types: Cigarettes  ? Smokeless tobacco: Never  ?Vaping Use  ? Vaping Use: Never used  ?Substance and Sexual Activity  ? Alcohol use: Yes  ?  Alcohol/week: 5.0 standard drinks  ?  Types: 5 Standard drinks or equivalent per week  ?  Comment: wine  ? Drug use: No  ? Sexual activity: Not Currently  ?  Partners: Male  ?  Birth control/protection: Post-menopausal  ?Other Topics Concern  ? Not on file  ?Social History Narrative  ? Not on file  ? ?Social Determinants of Health  ? ?  Financial Resource Strain: Not on file  ?Food Insecurity: Not on file  ?Transportation Needs: Not on file  ?Physical Activity: Not on file  ?Stress: Not on file  ?Social Connections: Not on file  ?  ? ?Family History: ?The patient's family history includes Dementia in her mother; Diabetes in her father and sister; Hypertension in her sister; Stroke in her father; Thyroid disease in her mother. ? ?ROS:   ?Please see the history of present illness.    ? All other systems reviewed and are negative. ? ?EKGs/Labs/Other Studies Reviewed:   ? ?The following studies were reviewed today: ? ?Echo 04/20/21: ? 1. Left ventricular ejection fraction, by estimation, is 60 to 65%. The  ?left ventricle has normal function. The left ventricle has no regional  ?wall motion abnormalities. There is mild left ventricular hypertrophy of  ?the septal segment. Left ventricular  ?diastolic parameters are indeterminate.  ? 2. Right ventricular systolic function is normal. The right ventricular  ?size is normal. There is mildly elevated pulmonary artery systolic  ?pressure.  ? 3. The mitral valve is normal in structure. No evidence of mitral valve  ?regurgitation. No evidence of mitral stenosis.  ? 4. The aortic valve is tricuspid. Aortic valve regurgitation is trivial.  ?No aortic stenosis is  present.  ? 5. The inferior vena cava is normal in size with greater than 50%  ?respiratory variability, suggesting right atrial pressure of 3 mmHg.  ? ?EKG:  EKG is  ordered today.  The ekg ordered today demonst

## 2022-04-23 DIAGNOSIS — M5136 Other intervertebral disc degeneration, lumbar region: Secondary | ICD-10-CM | POA: Diagnosis not present

## 2022-04-23 DIAGNOSIS — D649 Anemia, unspecified: Secondary | ICD-10-CM | POA: Diagnosis not present

## 2022-04-23 DIAGNOSIS — I4819 Other persistent atrial fibrillation: Secondary | ICD-10-CM | POA: Diagnosis not present

## 2022-04-23 DIAGNOSIS — R197 Diarrhea, unspecified: Secondary | ICD-10-CM | POA: Diagnosis not present

## 2022-04-23 DIAGNOSIS — E039 Hypothyroidism, unspecified: Secondary | ICD-10-CM | POA: Diagnosis not present

## 2022-04-23 DIAGNOSIS — Z981 Arthrodesis status: Secondary | ICD-10-CM | POA: Diagnosis not present

## 2022-04-23 DIAGNOSIS — I10 Essential (primary) hypertension: Secondary | ICD-10-CM | POA: Diagnosis not present

## 2022-04-23 DIAGNOSIS — R635 Abnormal weight gain: Secondary | ICD-10-CM | POA: Diagnosis not present

## 2022-04-23 DIAGNOSIS — R630 Anorexia: Secondary | ICD-10-CM | POA: Diagnosis not present

## 2022-04-23 DIAGNOSIS — Z853 Personal history of malignant neoplasm of breast: Secondary | ICD-10-CM | POA: Diagnosis not present

## 2022-04-23 DIAGNOSIS — F411 Generalized anxiety disorder: Secondary | ICD-10-CM | POA: Diagnosis not present

## 2022-04-28 ENCOUNTER — Encounter: Payer: Self-pay | Admitting: Physical Medicine & Rehabilitation

## 2022-05-10 ENCOUNTER — Ambulatory Visit (HOSPITAL_COMMUNITY): Payer: Medicare PPO | Attending: Cardiovascular Disease

## 2022-05-10 DIAGNOSIS — R197 Diarrhea, unspecified: Secondary | ICD-10-CM | POA: Insufficient documentation

## 2022-05-10 DIAGNOSIS — R0609 Other forms of dyspnea: Secondary | ICD-10-CM | POA: Diagnosis not present

## 2022-05-10 DIAGNOSIS — I4819 Other persistent atrial fibrillation: Secondary | ICD-10-CM | POA: Insufficient documentation

## 2022-05-10 LAB — ECHOCARDIOGRAM COMPLETE
Area-P 1/2: 3.57 cm2
P 1/2 time: 388 msec
S' Lateral: 2.9 cm

## 2022-05-11 ENCOUNTER — Telehealth: Payer: Self-pay | Admitting: Orthopedic Surgery

## 2022-05-11 NOTE — Telephone Encounter (Signed)
Patient called needing something called into her pharmacy for pain in her back. Patient said she uses [facility]. The number to contact patient is [phone]

## 2022-05-14 DIAGNOSIS — Z981 Arthrodesis status: Secondary | ICD-10-CM | POA: Diagnosis not present

## 2022-05-14 DIAGNOSIS — I10 Essential (primary) hypertension: Secondary | ICD-10-CM | POA: Diagnosis not present

## 2022-05-14 DIAGNOSIS — M5136 Other intervertebral disc degeneration, lumbar region: Secondary | ICD-10-CM | POA: Diagnosis not present

## 2022-05-20 ENCOUNTER — Telehealth: Payer: Self-pay | Admitting: Physician Assistant

## 2022-05-20 DIAGNOSIS — M5136 Other intervertebral disc degeneration, lumbar region: Secondary | ICD-10-CM | POA: Diagnosis not present

## 2022-05-20 DIAGNOSIS — E7800 Pure hypercholesterolemia, unspecified: Secondary | ICD-10-CM | POA: Diagnosis not present

## 2022-05-20 DIAGNOSIS — I10 Essential (primary) hypertension: Secondary | ICD-10-CM | POA: Diagnosis not present

## 2022-05-20 DIAGNOSIS — G8929 Other chronic pain: Secondary | ICD-10-CM | POA: Diagnosis not present

## 2022-05-20 DIAGNOSIS — M5442 Lumbago with sciatica, left side: Secondary | ICD-10-CM | POA: Diagnosis not present

## 2022-05-20 DIAGNOSIS — M5441 Lumbago with sciatica, right side: Secondary | ICD-10-CM | POA: Diagnosis not present

## 2022-05-20 DIAGNOSIS — F411 Generalized anxiety disorder: Secondary | ICD-10-CM | POA: Diagnosis not present

## 2022-05-20 NOTE — Telephone Encounter (Signed)
Spoke to patient    Result given. Patient states the swelling has gone down and no redness. She states it up and down. She does not weight herself daily. She states she did take 3 days of  60 mg of lasix  ( 3 x 20 mg) as primary instructed.   Patient could not tell RN the exact  time period except it was in the las 2-3 weeks  Patient has not been weighing herself daily.  Patient states she did not check blood pressure today. RN recommend to patient to weight daily and keep a reading and bring to next appointment.    Patient wanted to know if she should take an extra  20 mg dose of lasix . RN informed patient  not to at present  keep a reading RN will defer to  A. Duke PA.   Keep appointment 06/15/22.  Patient verbalized understanding.

## 2022-05-20 NOTE — Telephone Encounter (Signed)
Called left message for patient to call tomorrow for instructions  unable to leave detail message - no dpr-

## 2022-05-20 NOTE — Telephone Encounter (Signed)
Patient returning call to discuss Echo results

## 2022-05-20 NOTE — Telephone Encounter (Signed)
Only take extra lasix for a 3lb weight gain overnight.

## 2022-05-20 NOTE — Telephone Encounter (Signed)
[name], [place]  05/11/2022  6:39 AM EDT     Your heart ultrasound shows a good squeeze function and no significant valvular disease. I suspect you may have some mild stiffening of your heart muscle, which comes with age but can make you retain fluid. How is your lower extremity swelling and redness? Did it improve with increased dose of lasix?

## 2022-05-21 NOTE — Telephone Encounter (Signed)
Left message to call office for instructions  from A. Duke PA

## 2022-05-26 ENCOUNTER — Encounter: Payer: Self-pay | Admitting: *Deleted

## 2022-05-27 ENCOUNTER — Ambulatory Visit (INDEPENDENT_AMBULATORY_CARE_PROVIDER_SITE_OTHER): Payer: Medicare PPO | Admitting: Orthopedic Surgery

## 2022-05-27 DIAGNOSIS — M545 Low back pain, unspecified: Secondary | ICD-10-CM | POA: Diagnosis not present

## 2022-05-28 ENCOUNTER — Encounter: Payer: Self-pay | Admitting: Orthopedic Surgery

## 2022-05-28 NOTE — Progress Notes (Signed)
Office Visit Note   Patient: [name]           Date of Birth: 25-Nov-1951           MRN: [id number] Visit Date: 05/27/2022 Requested by: Isabel Jordan, MD [place],  [place] [id number] PCP: Isabel Jordan, MD  Subjective: Chief Complaint  Patient presents with   Lower Back - Pain    HPI: [name] is a 71 year old patient with low back pain.  She states her symptoms are getting worse.  She has difficulties with ADLs.  No longer able to live a normal life.  She states "I cannot do anything".  States her back hurts her all the time.  Takes Tylenol on a daily basis.  Tramadol also helps some.  Oxycodone makes her sick.  Her husband had a dresser this morning.  Reports primarily left-sided symptoms.  She did have left-sided foraminal stenosis noted on her last MRI scan.  She has been working with [name] and physical therapy.  She has some type of scan for breast cancer pending.  She has been reluctant to consider surgical intervention.              ROS: All systems reviewed are negative as they relate to the chief complaint within the history of present illness.  Patient denies  fevers or chills.   Assessment & Plan: Visit Diagnoses:  1. Low back pain, unspecified back pain laterality, unspecified chronicity, unspecified whether sciatica present     Plan: Impression is low back pain with left-sided radiculopathy.  She is getting to the point now where she would consider surgical intervention.  She has exhausted all nonoperative conservative measures.  She has had back surgery before by [name].  Plan to refer her back to [name] to evaluate adjacent segment disease both above and below the level of prior fusion.  She does have left-sided radiculopathy but is on Eliquis.  Follow-up with [place] as needed  Follow-Up Instructions: Return if symptoms worsen or fail to improve.   Orders:  Orders Placed This Encounter  Procedures   Ambulatory referral to  Neurosurgery   No orders of the defined types were placed in this encounter.     Procedures: No procedures performed   Clinical Data: No additional findings.  Objective: Vital Signs: LMP  (LMP Unknown)   Physical Exam:   Constitutional: Patient appears well-developed HEENT:  Head: Normocephalic Eyes:EOM are normal Neck: Normal range of motion Cardiovascular: Normal rate Pulmonary/chest: Effort normal Neurologic: Patient is alert Skin: Skin is warm Psychiatric: Patient has normal mood and affect   Ortho Exam: Ortho exam demonstrates no nerve root tension signs.  On the right-hand side.  She does have positive nerve root tension signs on the left.  5 out of 5 ankle dorsiflexion plantarflexion quad hamstring strength with no paresthesias L1-S1 bilaterally.  Pedal pulses palpable.  Mild pain with forward and lateral bending.  Specialty Comments:  No specialty comments available.  Imaging: No results found.   PMFS History: Patient Active Problem List   Diagnosis Date Noted   Arthritis of left shoulder region    S/P reverse total shoulder arthroplasty, 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   Vitamin D deficiency 01/15/2021   Vitamin B12 deficiency 01/15/2021   Primary insomnia 01/15/2021   Morbid obesity ([place]) 01/15/2021   Moderate recurrent major depression ([place]) 01/15/2021   Benzodiazepine dependence ([place]) 01/15/2021   Port-A-Cath in place 07/22/2020   Left leg cellulitis 04/17/2020  Cellulitis of left leg 04/17/2020   Hypokalemia 04/17/2020   Macrocytic anemia 04/17/2020   Anxiety 04/17/2020   Depression 04/17/2020   Chronic diarrhea 04/17/2020   Chemotherapy induced diarrhea 04/17/2020   Malignant neoplasm of upper-inner quadrant of right breast in female, estrogen receptor positive ([place]) 12/26/2019   Encephalopathy 02/02/2018   Hypothyroidism 02/02/2018   AKI (acute kidney injury) ([place]) 02/02/2018   Chronic back pain 02/02/2018   Alcohol abuse 02/02/2018   Weight gain  02/02/2018   Benign essential HTN 02/02/2018   Past Medical History:  Diagnosis Date   Anxiety    Atrial fibrillation [facility])    sees Dr. Audie Box   Cancer [facility])    breast   Chronic pain    Concussion 02/2007   ICU x 3 days   Depression    Hypertension    Hypothyroidism    Personal history of chemotherapy    Personal history of radiation therapy    Spinal stenosis    Thyroid disease ?1994    Family History  Problem Relation Age of Onset   Thyroid disease Mother    Dementia Mother    Diabetes Father    Stroke Father    Hypertension Sister    Diabetes Sister     Past Surgical History:  Procedure Laterality Date   BREAST BIOPSY Right 12/20/2019   x2   BREAST LUMPECTOMY Right 01/22/2020   BREAST LUMPECTOMY WITH RADIOACTIVE SEED AND SENTINEL LYMPH NODE BIOPSY Right 01/22/2020   Procedure: RIGHT BREAST LUMPECTOMY WITH RADIOACTIVE SEED X2 AND RIGHT SENTINEL LYMPH NODE MAPPING;  Surgeon: Erroll Luna, MD;  Location: [place];  Service: General;  Laterality: Right;   BREAST SURGERY Right 03/1998   breast biopsy, benign   EYE SURGERY Right    PORT-A-CATH REMOVAL Right 04/30/2021   Procedure: REMOVAL PORT-A-CATH;  Surgeon: Erroll Luna, MD;  Location: [place];  Service: General;  Laterality: Right;   PORTACATH PLACEMENT Right 01/22/2020   Procedure: INSERTION PORT-A-CATH WITH ULTRASOUND;  Surgeon: Erroll Luna, MD;  Location: [place];  Service: General;  Laterality: Right;   PORTACATH PLACEMENT Right 02/28/2020   Procedure: PORT A CATH REVISION;  Surgeon: Erroll Luna, MD;  Location: [place];  Service: General;  Laterality: Right;   REVERSE SHOULDER ARTHROPLASTY Left 02/02/2021   Procedure: LEFT REVERSE SHOULDER ARTHROPLASTY;  Surgeon: Meredith Pel, MD;  Location: [place];  Service: Orthopedics;  Laterality: Left;   SHOULDER SURGERY Right    SPINAL FUSION  03/04/2011   with ORIF   Social History   Occupational  History   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

## 2022-06-01 ENCOUNTER — Telehealth: Payer: Self-pay | Admitting: Orthopedic Surgery

## 2022-06-01 NOTE — Telephone Encounter (Signed)
IC pt and advised

## 2022-06-01 NOTE — Telephone Encounter (Signed)
Patient called. Says she has not heard anything from the neurosurgeon's office to schedule. Her call back number is [phone]

## 2022-06-08 ENCOUNTER — Other Ambulatory Visit: Payer: Self-pay | Admitting: Cardiovascular Disease

## 2022-06-08 DIAGNOSIS — I4819 Other persistent atrial fibrillation: Secondary | ICD-10-CM

## 2022-06-08 NOTE — Telephone Encounter (Signed)
Eliquis '5mg'$  refill request received. Patient is 71 years old, weight-118.8kg, Crea-0.87 on 04/08/22, Diagnosis-Afib, and last seen by Fabian Sharp, [place] on 04/21/2022. Dose is appropriate based on dosing criteria. Will send in refill to requested pharmacy.

## 2022-06-15 ENCOUNTER — Encounter: Payer: Self-pay | Admitting: Physician Assistant

## 2022-06-15 ENCOUNTER — Ambulatory Visit (INDEPENDENT_AMBULATORY_CARE_PROVIDER_SITE_OTHER): Payer: Medicare PPO | Admitting: Physician Assistant

## 2022-06-15 VITALS — BP 160/86 | HR 69 | Ht 67.0 in | Wt 255.6 lb

## 2022-06-15 DIAGNOSIS — E785 Hyperlipidemia, unspecified: Secondary | ICD-10-CM

## 2022-06-15 DIAGNOSIS — I10 Essential (primary) hypertension: Secondary | ICD-10-CM | POA: Diagnosis not present

## 2022-06-15 DIAGNOSIS — R600 Localized edema: Secondary | ICD-10-CM | POA: Diagnosis not present

## 2022-06-15 DIAGNOSIS — R0602 Shortness of breath: Secondary | ICD-10-CM | POA: Diagnosis not present

## 2022-06-15 DIAGNOSIS — Z7901 Long term (current) use of anticoagulants: Secondary | ICD-10-CM | POA: Diagnosis not present

## 2022-06-15 DIAGNOSIS — I4819 Other persistent atrial fibrillation: Secondary | ICD-10-CM

## 2022-06-15 DIAGNOSIS — R197 Diarrhea, unspecified: Secondary | ICD-10-CM | POA: Diagnosis not present

## 2022-06-15 MED ORDER — CARVEDILOL 12.5 MG PO TABS: 12.5000 mg | ORAL_TABLET | Freq: Two times a day (BID) | ORAL | 6 refills | Status: DC

## 2022-06-18 ENCOUNTER — Other Ambulatory Visit: Payer: Self-pay | Admitting: Nurse Practitioner

## 2022-06-18 DIAGNOSIS — I4821 Permanent atrial fibrillation: Secondary | ICD-10-CM | POA: Diagnosis not present

## 2022-06-18 DIAGNOSIS — F331 Major depressive disorder, recurrent, moderate: Secondary | ICD-10-CM | POA: Diagnosis not present

## 2022-06-18 DIAGNOSIS — E559 Vitamin D deficiency, unspecified: Secondary | ICD-10-CM | POA: Diagnosis not present

## 2022-06-18 DIAGNOSIS — Z9889 Other specified postprocedural states: Secondary | ICD-10-CM | POA: Diagnosis not present

## 2022-06-18 DIAGNOSIS — M5136 Other intervertebral disc degeneration, lumbar region: Secondary | ICD-10-CM | POA: Diagnosis not present

## 2022-06-18 DIAGNOSIS — K580 Irritable bowel syndrome with diarrhea: Secondary | ICD-10-CM | POA: Diagnosis not present

## 2022-06-18 DIAGNOSIS — I10 Essential (primary) hypertension: Secondary | ICD-10-CM | POA: Diagnosis not present

## 2022-06-18 DIAGNOSIS — F411 Generalized anxiety disorder: Secondary | ICD-10-CM | POA: Diagnosis not present

## 2022-06-23 NOTE — Progress Notes (Signed)
Patient Care Team: Jonathon Jordan, MD as PCP - General (Family Medicine) O'Neal, Cassie Freer, MD as PCP - Cardiology (Cardiology) Rockwell Germany, RN as Oncology Nurse Navigator Tressie Ellis, Paulette Blanch, RN as Oncology Nurse Navigator Erroll Luna, MD as Consulting Physician (General Surgery) Nicholas Lose, MD as Consulting Physician (Hematology and Oncology) Gery Pray, MD as Consulting Physician (Radiation Oncology) [place], Tami Lin, [place] as Physician Assistant (Cardiology)  DIAGNOSIS:  Encounter Diagnosis  Name Primary?   Malignant neoplasm of upper-inner quadrant of right breast in female, estrogen receptor positive ([place])     SUMMARY OF ONCOLOGIC HISTORY: Oncology History  Malignant neoplasm of upper-inner quadrant of right breast in female, estrogen receptor positive ([place])  12/26/2019 Initial Diagnosis   Patient palpated a right breast lump x1wk. Mammogram and US showed two adjacent masses at the 2 o'clock position measuring 1.4cm and 0.6cm, calcifications in the outer right breast at the 9 o'clock position, no right axillary adenopathy. Biopsy showed IDC at the 2 o'clock position, grade 3, HER-2 + (3+), ER+ 90%, PR+ 30%, Ki67 30%, and DCIS in the upper outer right breast, high grade, ER+ 95%, PR 90%.   01/22/2020 Surgery   Right breast lumpectomy x2 ([place]): Medial position: IDC, grade 3, 2.3cm, with high grade DCIS, clear margins Lateral position: high grade DCIS, clear margins, 4 right axillary lymph nodes negative    01/22/2020 Cancer Staging   Staging form: Breast, AJCC 8th Edition - Pathologic stage from 01/22/2020: Stage IA (pT2, pN0, cM0, G3, ER+, PR+, HER2+) - Signed by Gardenia Phlegm, NP on 02/06/2020   02/19/2020 - 01/27/2021 Chemotherapy   Patient is on Treatment Plan : BREAST weekly PACLitaxel / trastuzumab / Maintenance trastuzumab every 21 days     05/29/2020 - 06/25/2020 Radiation Therapy   Adjuvant radiation     CHIEF COMPLIANT:  Follow-up of right  breast cancer On Anastrozole  INTERVAL HISTORY: [name] is a 71 y.o. with above-mentioned history of right breast cancer having undergone a lumpectomy, competed adjuvant chemotherapy, radiation, and Herceptin maintenance, currently on antiestrogen therapy with anastrozole. She presents to the clinic today for a follow-up. States that she had a shoulder replace and a broken hip. She also have a lot of back pain and body pain. She denies pain and discomfort in breast. She sweats when she is moving doesn't know if it is hot flashes. She gets very little time for exercise.   ALLERGIES:  is allergic to zolpidem tartrate.  MEDICATIONS:  Current Outpatient Medications  Medication Sig Dispense Refill   acetaminophen (TYLENOL) 500 MG tablet Take 1,000 mg by mouth every 6 (six) hours as needed for mild pain.     amitriptyline (ELAVIL) 25 MG tablet Take 25-75 mg by mouth at bedtime as needed for sleep.     anastrozole (ARIMIDEX) 1 MG tablet TAKE ONE TABLET BY MOUTH DAILY. 90 tablet 3   Calcium-Magnesium-Zinc 500-250-12.5 MG TABS Take 1 tablet by mouth daily.     carvedilol (COREG) 12.5 MG tablet Take 1 tablet (12.5 mg total) by mouth 2 (two) times daily. 30 tablet 6   Cholecalciferol (VITAMIN D) 125 MCG (5000 UT) CAPS Take 5,000 Units by mouth daily.     diazepam (VALIUM) 10 MG tablet Take 1 tablet (10 mg total) by mouth at bedtime as needed for anxiety. 30 tablet 2   DULoxetine (CYMBALTA) 60 MG capsule Take 60 mg by mouth 2 (two) times daily.     ELIQUIS 5 MG TABS tablet TAKE ONE TABLET  BY MOUTH TWICE DAILY. 180 tablet 1   furosemide (LASIX) 20 MG tablet Take 20-40 mg by mouth daily as needed for fluid or edema.     gabapentin (NEURONTIN) 100 MG capsule Take 100 mg by mouth 3 (three) times daily.     levothyroxine (SYNTHROID) 150 MCG tablet Take 150 mcg by mouth daily in the afternoon.     naloxone (NARCAN) nasal spray 4 mg/0.1 mL SMARTSIG:1 Spray(s) Both Nares Daily PRN      oxyCODONE-acetaminophen (PERCOCET) 5-325 MG tablet Take 1 tablet by mouth every 12 (twelve) hours as needed for severe pain. Do not take with tramadol/hydrocodone.  Do not operate a motor vehicle while taking this medication 30 tablet 0   potassium chloride (KLOR-CON) 10 MEQ tablet Take 10 mEq by mouth daily. May take 1 additional tablet ( 10 MeQ) if taking lasix.     traMADol (ULTRAM) 50 MG tablet Take 1 tablet (50 mg total) by mouth every 12 (twelve) hours as needed. 30 tablet 0   vitamin B-12 (CYANOCOBALAMIN) 1000 MCG tablet Take 1,000 mcg by mouth daily in the afternoon.     No current facility-administered medications for this visit.    PHYSICAL EXAMINATION: ECOG PERFORMANCE STATUS: 1 - Symptomatic but completely ambulatory  Vitals:   07/06/22 1336  BP: (!) 183/85  Pulse: 87  Resp: 17  Temp: (!) 97.2 F (36.2 C)  SpO2: 96%   Filed Weights   07/06/22 1336  Weight: 254 lb (115.2 kg)     LABORATORY DATA:  I have reviewed the data as listed    Latest Ref Rng & Units 04/08/2022   10:31 AM 04/28/2021   11:11 AM 04/08/2021    5:25 PM  CMP  Glucose 70 - 99 mg/dL 118  87    BUN 8 - 23 mg/dL 13  15    Creatinine 0.44 - 1.00 mg/dL 0.87  0.69  0.60   Sodium 135 - 145 mmol/L 141  138    Potassium 3.5 - 5.1 mmol/L 4.0  4.4    Chloride 98 - 111 mmol/L 105  104    CO2 22 - 32 mmol/L 29  27    Calcium 8.9 - 10.3 mg/dL 8.6  8.7      Lab Results  Component Value Date   WBC 6.3 04/08/2022   HGB 11.3 (L) 04/08/2022   HCT 35.8 (L) 04/08/2022   MCV 103.5 (H) 04/08/2022   PLT 175 04/08/2022   NEUTROABS 4.4 04/08/2022    ASSESSMENT & PLAN:  Malignant neoplasm of upper-inner quadrant of right breast in female, estrogen receptor positive ([place]) 01/22/2020: Right medial lumpectomy: Grade 3 IDC 2.3 cm with high-grade DCIS with necrosis, margins negative, negative for lymphovascular or perineural invasion, Right lateral lumpectomy: Isolated foci of DCIS high-grade, resection margins  negative 4 lymph nodes negative, ER 90%, PR 30%, HER-2 3+ positive, Ki-67 30%   Treatment plan: 1.  Adjuvant chemotherapy with Taxol Herceptin weekly x 9 (stopped early because of lower extremity edema) followed by Herceptin maintenance completed 01/27/2021 2.  Adjuvant radiation therapy started 05/29/2020 completed 06/25/2020 3.  Followed by adjuvant antiestrogen therapy  started 06/25/2020 ----------------------------------------------------------------------------------------------------------------------------------------------------------- Current treatment: Anastrozole started 06/25/20   Major depression: Currently on Effexor   08/21/2020: Mildly comminuted and displaced fracture of proximal left humerus: After a fall 10/02/2020: Left hip femoral fracture Adrenal mass and liver abnormalities:   CT CAP 04/09/2021: Unchanged left adrenal mass 2 cm nonspecific.  No suspicious hepatic lesions.  Cholelithiasis, colon diverticulosis  Anastrozole Toxicities: 1.  Hot flashes 2. hip fracture  She had shoulder replacement surgery.   Chronic back pain issues  Insomnia: Sent a prescription for Valium   Breast Cancer Surveillance:  Mammograms: 06/15/21: Benign, new mammogram is being scheduled for today.   RTC in 1.5 years     No orders of the defined types were placed in this encounter.  The patient has a good understanding of the overall plan. she agrees with it. she will call with any problems that may develop before the next visit here. Total time spent: 30 mins including face to face time and time spent for planning, charting and co-ordination of care   Harriette Ohara, MD 07/06/22    I Gardiner Coins am scribing for Dr. Lindi Adie  I have reviewed the above documentation for accuracy and completeness, and I agree with the above.

## 2022-06-25 ENCOUNTER — Other Ambulatory Visit: Payer: Self-pay | Admitting: Hematology and Oncology

## 2022-06-25 DIAGNOSIS — Z853 Personal history of malignant neoplasm of breast: Secondary | ICD-10-CM

## 2022-06-28 ENCOUNTER — Ambulatory Visit (INDEPENDENT_AMBULATORY_CARE_PROVIDER_SITE_OTHER): Payer: Medicare PPO | Admitting: Internal Medicine

## 2022-06-28 ENCOUNTER — Encounter: Payer: Self-pay | Admitting: Internal Medicine

## 2022-06-28 ENCOUNTER — Telehealth: Payer: Self-pay

## 2022-06-28 VITALS — HR 73 | Ht 67.0 in | Wt 256.0 lb

## 2022-06-28 DIAGNOSIS — Z1211 Encounter for screening for malignant neoplasm of colon: Secondary | ICD-10-CM

## 2022-06-28 DIAGNOSIS — R197 Diarrhea, unspecified: Secondary | ICD-10-CM | POA: Diagnosis not present

## 2022-06-28 MED ORDER — PLENVU 140 G PO SOLR: 1.0000 | Freq: Once | ORAL | 0 refills | Status: AC

## 2022-06-28 NOTE — Telephone Encounter (Signed)
[facility] Pre-operative Risk Assessment     Request for surgical clearance:     Endoscopy Procedure  What type of surgery is being performed?     Colonoscopy  When is this surgery scheduled?     08/19/2022  What type of clearance is required ?   Pharmacy  Are there any medications that need to be held prior to surgery and how long? Eliquis - 2 days  Practice name and name of physician performing surgery?      [facility]  What is your office phone and fax number?      Phone- [phone]  [phone]  Anesthesia type (None, local, MAC, general) ?       MAC

## 2022-06-28 NOTE — Progress Notes (Signed)
Chief Complaint: Chronic diarrhea  HPI : 71 year old female with history of breast cancer s/p lumpectomy/radiation/chemotherapy, A-fib on Eliquis, hypothyroidism presents with chronic diarrhea  She has had diarrhea for a long time, but her diarrhea worsened 6 months ago. Her chemotherapy session may have contributed to the development of the diarrhea. Endorses fecal incontinence. Imodium and Pepto Bismol have not helped. Bentyl has helped a little. She has to take oxycodone, which has actually helped to reduce the frequency of her BMs. She now endorses about 1 BM per day. If she doesn't take her oxycodone, then she will be running to the bathroom all day. Her oxycodone has also prevented fecal incontinence. She plans to get off of the oxycodone eventually and is scheduled to see her orthopedist soon. Denies N&V or dysphagia. Mother had some diarrhea issue as well. Denies fam hx of GI cancers. She is on Eliquis regularly. Endorses fluid retention after her chemotherapy. Her last colonoscopy was 25 years ago. Denies blood in the stools. Denies rectal pain or abdominal pain.   BP was not collected due to recent fall on left arm and right-sided lumpectomy.  Wt Readings from Last 3 Encounters:  06/28/22 256 lb (116.1 kg)  06/15/22 255 lb 9.6 oz (115.9 kg)  04/21/22 261 lb 12.8 oz (118.8 kg)   Past Medical History:  Diagnosis Date   Anxiety    Atrial fibrillation [facility])    sees Dr. Audie Box   Cancer [facility])    breast   Chronic pain    Concussion 02/2007   ICU x 3 days   Depression    Hypertension    Hypothyroidism    Personal history of chemotherapy    Personal history of radiation therapy    Spinal stenosis    Thyroid disease ?1994    Past Surgical History:  Procedure Laterality Date   BREAST BIOPSY Right 12/20/2019   x2   BREAST LUMPECTOMY Right 01/22/2020   BREAST LUMPECTOMY WITH RADIOACTIVE SEED AND SENTINEL LYMPH NODE BIOPSY Right 01/22/2020   Procedure: RIGHT BREAST LUMPECTOMY  WITH RADIOACTIVE SEED X2 AND RIGHT SENTINEL LYMPH NODE MAPPING;  Surgeon: Erroll Luna, MD;  Location: [place];  Service: General;  Laterality: Right;   BREAST SURGERY Right 03/1998   breast biopsy, benign   EYE SURGERY Right    PORT-A-CATH REMOVAL Right 04/30/2021   Procedure: REMOVAL PORT-A-CATH;  Surgeon: Erroll Luna, MD;  Location: [place];  Service: General;  Laterality: Right;   PORTACATH PLACEMENT Right 01/22/2020   Procedure: INSERTION PORT-A-CATH WITH ULTRASOUND;  Surgeon: Erroll Luna, MD;  Location: [place];  Service: General;  Laterality: Right;   PORTACATH PLACEMENT Right 02/28/2020   Procedure: PORT A CATH REVISION;  Surgeon: Erroll Luna, MD;  Location: [place];  Service: General;  Laterality: Right;   REVERSE SHOULDER ARTHROPLASTY Left 02/02/2021   Procedure: LEFT REVERSE SHOULDER ARTHROPLASTY;  Surgeon: Meredith Pel, MD;  Location: [place];  Service: Orthopedics;  Laterality: Left;   SHOULDER SURGERY Right    SPINAL FUSION  03/04/2011   with ORIF   Family History  Problem Relation Age of Onset   Thyroid disease Mother    Dementia Mother    Diabetes Father    Stroke Father    Hypertension Sister    Diabetes Sister    Colon cancer Neg Hx    Colon polyps Neg Hx    Stomach cancer Neg Hx    Esophageal cancer Neg Hx  Social History   Tobacco Use   Smoking status: Former    Years: 20.00    Types: Cigarettes   Smokeless tobacco: Never  Vaping Use   Vaping Use: Never used  Substance Use Topics   Alcohol use: Yes    Alcohol/week: 5.0 standard drinks of alcohol    Types: 5 Standard drinks or equivalent per week    Comment: wine   Drug use: No   Current Outpatient Medications  Medication Sig Dispense Refill   acetaminophen (TYLENOL) 500 MG tablet Take 1,000 mg by mouth every 6 (six) hours as needed for mild pain.     amitriptyline (ELAVIL) 25 MG tablet Take 25-75 mg by mouth at bedtime as  needed for sleep.     anastrozole (ARIMIDEX) 1 MG tablet TAKE ONE TABLET BY MOUTH DAILY. 90 tablet 3   Calcium-Magnesium-Zinc 500-250-12.5 MG TABS Take 1 tablet by mouth daily.     carvedilol (COREG) 12.5 MG tablet Take 1 tablet (12.5 mg total) by mouth 2 (two) times daily. 30 tablet 6   Cholecalciferol (VITAMIN D) 125 MCG (5000 UT) CAPS Take 5,000 Units by mouth daily.     ELIQUIS 5 MG TABS tablet TAKE ONE TABLET BY MOUTH TWICE DAILY. 180 tablet 1   furosemide (LASIX) 20 MG tablet Take 20-40 mg by mouth daily as needed for fluid or edema.     levothyroxine (SYNTHROID) 150 MCG tablet Take 150 mcg by mouth daily in the afternoon.     oxyCODONE-acetaminophen (PERCOCET) 5-325 MG tablet Take 1 tablet by mouth every 12 (twelve) hours as needed for severe pain. Do not take with tramadol/hydrocodone.  Do not operate a motor vehicle while taking this medication 30 tablet 0   potassium chloride (KLOR-CON) 10 MEQ tablet Take 10 mEq by mouth daily. May take 1 additional tablet ( 10 MeQ) if taking lasix.     venlafaxine XR (EFFEXOR-XR) 150 MG 24 hr capsule Take 150 mg by mouth daily in the afternoon.     traMADol (ULTRAM) 50 MG tablet Take by mouth. (Patient not taking: Reported on 06/28/2022)     vitamin B-12 (CYANOCOBALAMIN) 1000 MCG tablet Take 1,000 mcg by mouth daily in the afternoon. (Patient not taking: Reported on 06/28/2022)     No current facility-administered medications for this visit.   Allergies  Allergen Reactions   Ambien [Zolpidem Tartrate] Other (See Comments)    Hallucinations and Confusion     Review of Systems: All systems reviewed and negative except where noted in HPI.   Physical Exam: Pulse 73   Ht '5\' 7"'$  (1.702 m)   Wt 256 lb (116.1 kg)   LMP  (LMP Unknown)   BMI 40.10 kg/m  Constitutional: Pleasant,well-developed, female in no acute distress. HEENT: Normocephalic and atraumatic. Conjunctivae are normal. No scleral icterus. Cardiovascular: Normal rate, regular rhythm.   Pulmonary/chest: Effort normal and breath sounds normal. No wheezing, rales or rhonchi. Abdominal: Soft, nondistended, nontender. Bowel sounds active throughout. There are no masses palpable. No hepatomegaly. Extremities: Trace BLE edema Neurological: Alert and oriented to person place and time. Skin: Skin is warm and dry. No rashes noted. Psychiatric: Normal mood and affect. Behavior is normal.  Labs 12/2020: CMP unremarkable  Labs 01/2021: TSH elevated at 8.12  Labs 03/2022: CBC with low Hb of 11.3, MCV 103.5. BMP unremarkable. TSH elevated at 4.533  Labs 04/2022: Vit B12 nml. TSH nml. Iron sat 25%. CBC with nml Hb.  CT C/A/P w/contrast 04/09/21: IMPRESSION: 1. Stable examination status post right  mastectomy and axillary lymph node dissection, without evidence of new or progressive disease. 2. Unchanged size of the left adrenal mass measuring 2 cm which demonstrates nonspecific imaging characteristics. Further evaluation with adrenal protocol MRI or CT is suggested. 3. The previously visualized subcentimeter low-attenuation lesion in the anterior right lobe of the liver is not visualized on today's examination possibly due to contrast timing. No new suspicious hepatic lesions. 4. Cholelithiasis without findings of acute cholecystitis. 5. Colonic diverticulosis without findings of acute diverticulitis. 6. Aortic atherosclerosis.  TTE 05/10/22: IMPRESSIONS   1. Left ventricular ejection fraction, by estimation, is 55 to 60%. The  left ventricle has normal function. The left ventricle has no regional  wall motion abnormalities. There is mild left ventricular hypertrophy of the inferior segment. Left ventricular diastolic parameters were normal. The average left ventricular global longitudinal strain is -17.6 %. The global longitudinal strain is normal.   2. Right ventricular systolic function is normal. The right ventricular  size is normal. There is normal pulmonary artery systolic  pressure.   3. The mitral valve is normal in structure. No evidence of mitral valve  regurgitation. No evidence of mitral stenosis.   4. The aortic valve is normal in structure. Aortic valve regurgitation is  trivial. No aortic stenosis is present.   5. Aortic dilatation noted. There is borderline dilatation of the  ascending aorta, measuring 36 mm.   6. The inferior vena cava is normal in size with greater than 50%  respiratory variability, suggesting right atrial pressure of 3 mmHg.   ASSESSMENT AND PLAN: Chronic diarrhea Fecal incontinence Colon cancer screening Patient presents with chronic diarrhea that has improved significantly because the patient was started on oxycodone. Will institute some therapies to try to help with her bowel habits. Patient is due for a colonoscopy for colon cancer screening so will schedule this. If the patient were to develop worsened diarrhea in the future again, could consider further evaluation at that time with stools test and additional labs. - Low FODMAP diet - Continue Bentyl PRN - Drink 8 cups of water daily or daily fiber supplement - Colonoscopy [facility]. Will plan to hold Eliquis 2 days before  Christia Reading, MD

## 2022-06-28 NOTE — Patient Instructions (Signed)
If you are age 73 or older, your body mass index should be between 23-30. Your Body mass index is 40.1 kg/m. If this is out of the aforementioned range listed, please consider follow up with your Primary Care Provider.  If you are age 41 or younger, your body mass index should be between 19-25. Your Body mass index is 40.1 kg/m. If this is out of the aformentioned range listed, please consider follow up with your Primary Care Provider.   ________________________________________________________  [facility] providers would like to encourage you to use [facility] to communicate with providers for non-urgent requests or questions.  Due to long hold times on the telephone, sending your provider a message by [facility] may be a faster and more efficient way to get a response.  Please allow 48 business hours for a response.  Please remember that this is for non-urgent requests.  _______________________________________________________  Dennis Bast have been scheduled for a colonoscopy. Please follow written instructions given to you at your visit today.  Please pick up your prep supplies at the pharmacy within the next 1-3 days. If you use inhalers (even only as needed), please bring them with you on the day of your procedure.  Drink 8 cups of water a day   Please purchase the following medications over the counter and take as directed:  Fiber supplement such as Benefiber- use as directed daily

## 2022-06-29 ENCOUNTER — Telehealth: Payer: Self-pay | Admitting: Physician Assistant

## 2022-06-29 DIAGNOSIS — M5136 Other intervertebral disc degeneration, lumbar region: Secondary | ICD-10-CM | POA: Diagnosis not present

## 2022-06-29 DIAGNOSIS — G8929 Other chronic pain: Secondary | ICD-10-CM | POA: Diagnosis not present

## 2022-06-29 DIAGNOSIS — M5442 Lumbago with sciatica, left side: Secondary | ICD-10-CM | POA: Diagnosis not present

## 2022-06-29 NOTE — Telephone Encounter (Signed)
New Message:.      Patient would like the name of the injection  for weight loss that Fabian Sharp discussed with her on her last office visit, please?Marland Kitchen

## 2022-06-29 NOTE — Telephone Encounter (Signed)
Patient with diagnosis of afib on Eliquis for anticoagulation.    Procedure: colonoscopy Date of procedure: 08/19/22  CHA2DS2-VASc Score = 3  This indicates a 3.2% annual risk of stroke. The patient's score is based upon: CHF History: 0 HTN History: 1 Diabetes History: 0 Stroke History: 0 Vascular Disease History: 0 Age Score: 1 Gender Score: 1   CrCl 35m/min using adjusted body weight due to obesity Platelet count 175K  Per office protocol, patient can hold Eliquis for 2 days prior to procedure as requested.    **This guidance is not considered finalized until pre-operative APP has relayed final recommendations.**

## 2022-06-29 NOTE — Telephone Encounter (Signed)
Charm Rings, you saw this patient on 06/15/22. Could you please comment on medical clearance for upcoming procedure. I am also forwarding request for Eliquis hold to pharmacist.  Thank you,  Emmaline Life, NP-C    06/29/2022, 8:57 AM [place] [address] Office [phone] Fax [phone]

## 2022-06-30 NOTE — Telephone Encounter (Signed)
Patient aware of advice/message from [place]

## 2022-06-30 NOTE — Telephone Encounter (Signed)
Ledora Bottcher, PA  You 12 minutes ago (2:39 PM)    I was probably referring to ozempic, but I don't manage those medications at this time. She should speak with her PCP because they tend to have significant side effects.

## 2022-06-30 NOTE — Telephone Encounter (Signed)
   Name: [name]  DOB: 05-Jun-1951  MRN: [id number]   Primary Cardiologist: Evalina Field, MD  Chart reviewed as part of pre-operative protocol coverage.  [name] was last seen on 06/14/22 by Fabian Sharp PAC.  She was doing well at that appt. She is at acceptable risk for endoscopy. She may hold  hold Eliquis for 2 days prior to procedure as requested.  Therefore, based on ACC/AHA guidelines, the patient would be at acceptable risk for the planned procedure without further cardiovascular testing.   I will route this recommendation to the requesting party via Epic fax function and remove from pre-op pool. Please call with questions.  Tami Lin Alondria Mousseau, PA 06/30/2022, 2:43 PM

## 2022-06-30 NOTE — Telephone Encounter (Signed)
Spoke with patient. She would like the name of the injectable weight loss med Angie PA mentioned at last appointment. Could not locate this in notes - advised if may have been Ozempic. She asked if we ([facility]) prescribes this. Advised unsure on this, would send her a message but also notified her this may need to come from PCP.

## 2022-06-30 NOTE — Telephone Encounter (Signed)
Patient call again to follow-up on  the name of the injection for weight loss that Fabian Sharp discussed with her on her last office visit.  Please advise.

## 2022-07-02 ENCOUNTER — Encounter: Payer: Medicare PPO | Attending: Physical Medicine & Rehabilitation | Admitting: Physical Medicine & Rehabilitation

## 2022-07-02 ENCOUNTER — Encounter: Payer: Self-pay | Admitting: Physical Medicine & Rehabilitation

## 2022-07-02 VITALS — BP 145/87 | HR 79 | Ht 67.0 in | Wt 257.2 lb

## 2022-07-02 DIAGNOSIS — Z5181 Encounter for therapeutic drug level monitoring: Secondary | ICD-10-CM

## 2022-07-02 DIAGNOSIS — Z79891 Long term (current) use of opiate analgesic: Secondary | ICD-10-CM

## 2022-07-02 DIAGNOSIS — G894 Chronic pain syndrome: Secondary | ICD-10-CM

## 2022-07-02 MED ORDER — TRAMADOL HCL 50 MG PO TABS: 50.0000 mg | ORAL_TABLET | Freq: Two times a day (BID) | ORAL | 0 refills | Status: DC | PRN

## 2022-07-02 NOTE — Progress Notes (Signed)
Subjective:    Patient ID: [name], female    DOB: Dec 10, 1951, 71 y.o.   MRN: [id number]  HPI CC:  Left sided low back pain    L4-5 fusion Dr Arnoldo Morale ? Many years ago , had improvement for a couple years post op.  Then started to have pain again.  The patient had recent lumbosacral spine CT scan demonstrating mild to moderate adjacent level degenerative changes at L3-L4, moderate facet arthrosis L5-S1 on the left side mild L5-S1 facet arthrosis on the right, no sciatic pain  Has been going to PT, working on balance and leg weakness, last session was ~51moago  Has rx for oxycodone but does not like to take    Does ok with tramadol , tolerate it much better than oxycodone   Had spine injections a long time ago last with Dr HMaryjean Kaseveral years ago, these were helpful but she does not recall what they were. Pain Inventory Average Pain 4 Pain Right Now 4 My pain is aching  In the last 24 hours, has pain interfered with the following? General activity 3 Relation with others 3 Enjoyment of life 3 What TIME of day is your pain at its worst? morning  Sleep (in general) Fair  Pain is worse with: walking, bending, and standing Pain improves with: medication Relief from Meds: 4  walk without assistance how many minutes can you walk? 5 ability to climb steps?  no do you drive?  yes  retired I need assistance with the following:  meal prep, household duties, and shopping  bladder control problems bowel control problems weakness trouble walking depression anxiety  Any changes since last visit?  no  Saw Dr JArnoldo Moralea few weeks ago  Any changes since last visit?  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  Other Topics Concern   Not on file  Social History Narrative   Not on file   Social Determinants of Health   Financial Resource Strain: Not on file  Food Insecurity: Not on file  Transportation Needs: Not on file  Physical Activity: Not on file  Stress: Not on file  Social Connections: Not on file   Past Surgical History:  Procedure Laterality Date   BREAST BIOPSY Right 12/20/2019   x2  BREAST LUMPECTOMY Right 01/22/2020   BREAST LUMPECTOMY WITH RADIOACTIVE SEED AND SENTINEL LYMPH NODE BIOPSY Right 01/22/2020   Procedure: RIGHT BREAST LUMPECTOMY WITH RADIOACTIVE SEED X2 AND RIGHT SENTINEL LYMPH NODE MAPPING;  Surgeon: Erroll Luna, MD;  Location: [place];  Service: General;  Laterality: Right;   BREAST SURGERY Right 03/1998   breast biopsy, benign   EYE SURGERY Right    PORT-A-CATH REMOVAL Right 04/30/2021   Procedure: REMOVAL PORT-A-CATH;  Surgeon: Erroll Luna, MD;  Location: [place];  Service: General;  Laterality: Right;   PORTACATH PLACEMENT Right 01/22/2020   Procedure: INSERTION PORT-A-CATH WITH ULTRASOUND;  Surgeon: Erroll Luna, MD;  Location: [place];  Service: General;  Laterality: Right;   PORTACATH PLACEMENT Right 02/28/2020    Procedure: PORT A CATH REVISION;  Surgeon: Erroll Luna, MD;  Location: [place];  Service: General;  Laterality: Right;   REVERSE SHOULDER ARTHROPLASTY Left 02/02/2021   Procedure: LEFT REVERSE SHOULDER ARTHROPLASTY;  Surgeon: Meredith Pel, MD;  Location: [place];  Service: Orthopedics;  Laterality: Left;   SHOULDER SURGERY Right    SPINAL FUSION  03/04/2011   with ORIF   Past Medical History:  Diagnosis Date   Anxiety    Atrial fibrillation [facility])    sees Dr. Audie Box   Cancer [facility])    breast   Chronic pain    Concussion 02/2007   ICU x 3 days   Depression    Hypertension    Hypothyroidism    Personal history of chemotherapy    Personal history of radiation therapy    Spinal stenosis    Thyroid disease ?1994   BP (!) 145/87   Pulse 79   Ht '5\' 7"'$  (1.702 m)   Wt 257 lb 3.2 oz (116.7 kg)   LMP  (LMP Unknown)   SpO2 94%   BMI 40.28 kg/m   Opioid Risk Score:   Fall Risk Score:  `1  Depression screen [facility] 2/9     07/02/2022    1:27 PM  Depression screen PHQ 2/9  Decreased Interest 3  Down, Depressed, Hopeless 3  PHQ - 2 Score 6  Altered sleeping 2  Tired, decreased energy 2  Change in appetite 1  Feeling bad or failure about yourself  2  Trouble concentrating 2  Moving slowly or fidgety/restless 0  Suicidal thoughts 1  PHQ-9 Score 16  Difficult doing work/chores Very difficult     Review of Systems  Constitutional:  Positive for unexpected weight change.       Gain  Eyes: Negative.   Respiratory: Negative.    Cardiovascular:  Positive for leg swelling.  Gastrointestinal:  Positive for diarrhea.  Endocrine: Negative.   Genitourinary:        Bladder control  Musculoskeletal:  Positive for back pain.  Skin: Negative.   Allergic/Immunologic: Negative.   Neurological:  Positive for weakness.  Hematological:  Bruises/bleeds easily.       Eliquis  Psychiatric/Behavioral:  Positive for dysphoric mood. The patient is nervous/anxious.   All other systems  reviewed and are negative.      Objective:   Physical Exam Vitals and nursing note reviewed.  Constitutional:      Appearance: She is obese.  HENT:     Head: Normocephalic and atraumatic.  Eyes:     Extraocular Movements: Extraocular movements intact.     Conjunctiva/sclera: Conjunctivae normal.     Pupils: Pupils are equal, round, and reactive to light.  Musculoskeletal:  Comments:  Sacral thrust (prone) : Positive on left  FABER's: Positive left SI area Distraction (supine): Negative Thigh thrust test: Positive left greater than right  Skin:    General: Skin is warm and dry.  Neurological:     Mental Status: She is alert and oriented to person, place, and time.  Psychiatric:        Mood and Affect: Mood normal.        Behavior: Behavior normal.           Assessment & Plan:   #1.  Left sided low back upper buttock pain location and exam most consistent with sacroiliac disorder.  We will schedule for sacroiliac injection.  She did have recent physical therapy and medication management without much effect. 2.  Lumbar postlaminectomy syndrome has adjacent level disease primarily at L3-L4 also has facet arthropathy mostly left-sided L5-S1.  She is undergoing neurosurgical evaluation and is awaiting lumbar MRI to be scheduled If she is scheduled for surgery, pain management postoperatively would be handled by neurosurgery and I would see the patient back after recovery from surgery on a as needed basis   From a medication management standpoint the patient has a history of alcohol abuse which she denies at this time.  Prescribed tramadol 50 mg 30 tabs to be taken once or twice a day as needed.  We will check oral swab to see if ongoing prescription would be available for patient.  If there is positive ethyl alcohol on testing would give a warning and have a repeat testing.  If positive once again would no longer be able to prescribe tramadol

## 2022-07-06 ENCOUNTER — Inpatient Hospital Stay: Payer: Medicare PPO | Attending: Hematology and Oncology | Admitting: Hematology and Oncology

## 2022-07-06 ENCOUNTER — Telehealth: Payer: Self-pay | Admitting: Hematology and Oncology

## 2022-07-06 ENCOUNTER — Ambulatory Visit
Admission: RE | Admit: 2022-07-06 | Discharge: 2022-07-06 | Disposition: A | Discharge status: Home / Self Care | Payer: Medicare PPO | Source: Ambulatory Visit | Attending: Hematology and Oncology | Admitting: Hematology and Oncology

## 2022-07-06 ENCOUNTER — Other Ambulatory Visit: Payer: Self-pay | Admitting: *Deleted

## 2022-07-06 DIAGNOSIS — Z853 Personal history of malignant neoplasm of breast: Secondary | ICD-10-CM | POA: Diagnosis not present

## 2022-07-06 DIAGNOSIS — Z79899 Other long term (current) drug therapy: Secondary | ICD-10-CM | POA: Diagnosis not present

## 2022-07-06 DIAGNOSIS — C50211 Malignant neoplasm of upper-inner quadrant of right female breast: Secondary | ICD-10-CM | POA: Insufficient documentation

## 2022-07-06 DIAGNOSIS — Z923 Personal history of irradiation: Secondary | ICD-10-CM | POA: Diagnosis not present

## 2022-07-06 DIAGNOSIS — Z170 Estrogen receptor positive status [ER+]: Secondary | ICD-10-CM | POA: Insufficient documentation

## 2022-07-06 DIAGNOSIS — Z79811 Long term (current) use of aromatase inhibitors: Secondary | ICD-10-CM | POA: Insufficient documentation

## 2022-07-06 DIAGNOSIS — G4700 Insomnia, unspecified: Secondary | ICD-10-CM | POA: Insufficient documentation

## 2022-07-06 DIAGNOSIS — Z9221 Personal history of antineoplastic chemotherapy: Secondary | ICD-10-CM | POA: Diagnosis not present

## 2022-07-06 DIAGNOSIS — R635 Abnormal weight gain: Secondary | ICD-10-CM

## 2022-07-06 MED ORDER — DIAZEPAM 10 MG PO TABS: 10.0000 mg | ORAL_TABLET | Freq: Every evening | ORAL | 2 refills | Status: DC | PRN

## 2022-07-06 NOTE — Progress Notes (Signed)
Per MD request, RN placed referral to [facility] for [facility].

## 2022-07-06 NOTE — Telephone Encounter (Signed)
Unable to schedule appointment per Dr.Gudena's 7/18 los. Scheduler is unable to schedule a 1.5 year appointment due to template not being released. Patient states she will call in 6 months to then schedule a year follow up with Dr.Gudena.

## 2022-07-06 NOTE — Assessment & Plan Note (Addendum)
01/22/2020: Right medial lumpectomy: Grade 3 IDC 2.3 cm with high-grade DCIS with necrosis, margins negative, negative for lymphovascular or perineural invasion, Right lateral lumpectomy: Isolated foci of DCIS high-grade, resection margins negative 4 lymph nodes negative, ER 90%, PR 30%, HER-2 3+ positive, Ki-67 30%  Treatment plan: 1.Adjuvant chemotherapy with Taxol Herceptin weekly x9(stopped early because of lower extremity edema)followed by Herceptin maintenancecompleted 01/27/2021 2.Adjuvant radiation therapystarted 6/10/2021completed 06/25/2020 3.Followed by adjuvant antiestrogen therapystarted7/06/2020 ----------------------------------------------------------------------------------------------------------------------------------------------------------- Current treatment:Anastrozole started 06/25/20  Major depression: Currently on Effexor  08/21/2020:Mildly comminuted and displaced fracture of proximal left humerus: Afterafall 10/02/2020: Left hip femoral fracture Adrenal mass and liver abnormalities:  CT CAP 04/09/2021: Unchanged left adrenal mass 2 cm nonspecific. No suspicious hepatic lesions. Cholelithiasis, colon diverticulosis  Anastrozole Toxicities: 1.  Hot flashes 2. hip fracture  She had shoulder replacement surgery.  Chronic back pain issues  Insomnia: Sent a prescription for Valium  Breast Cancer Surveillance:  Mammograms: 06/15/21: Benign, new mammogram is being scheduled for today.  RTC in 1.5 years

## 2022-07-07 LAB — DRUG TOX MONITOR 1 W/CONF, ORAL FLD
Alprazolam: NEGATIVE ng/mL (ref <0.50)
Amphetamines: NEGATIVE ng/mL (ref <10)
Barbiturates: NEGATIVE ng/mL (ref <10)
Benzodiazepines: POSITIVE ng/mL — AB (ref <0.50)
Buprenorphine: NEGATIVE ng/mL (ref <0.10)
Chlordiazepoxide: NEGATIVE ng/mL (ref <0.50)
Clonazepam: NEGATIVE ng/mL (ref <0.50)
Cocaine: NEGATIVE ng/mL (ref <5.0)
Codeine: NEGATIVE ng/mL (ref <2.5)
Diazepam: NEGATIVE ng/mL (ref <0.50)
Dihydrocodeine: NEGATIVE ng/mL (ref <2.5)
Fentanyl: NEGATIVE ng/mL (ref <0.10)
Flunitrazepam: NEGATIVE ng/mL (ref <0.50)
Flurazepam: NEGATIVE ng/mL (ref <0.50)
Heroin Metabolite: NEGATIVE ng/mL (ref <1.0)
Hydrocodone: NEGATIVE ng/mL (ref <2.5)
Hydromorphone: NEGATIVE ng/mL (ref <2.5)
Lorazepam: NEGATIVE ng/mL (ref <0.50)
MARIJUANA: NEGATIVE ng/mL (ref <2.5)
MDMA: NEGATIVE ng/mL (ref <10)
Meprobamate: NEGATIVE ng/mL (ref <2.5)
Methadone: NEGATIVE ng/mL (ref <5.0)
Midazolam: NEGATIVE ng/mL (ref <0.50)
Morphine: NEGATIVE ng/mL (ref <2.5)
Nicotine Metabolite: NEGATIVE ng/mL (ref <5.0)
Nordiazepam: 0.68 ng/mL — ABNORMAL HIGH (ref <0.50)
Norhydrocodone: NEGATIVE ng/mL (ref <2.5)
Noroxycodone: 11.6 ng/mL — ABNORMAL HIGH (ref <2.5)
Opiates: POSITIVE ng/mL — AB (ref <2.5)
Oxazepam: NEGATIVE ng/mL (ref <0.50)
Oxycodone: 52.6 ng/mL — ABNORMAL HIGH (ref <2.5)
Oxymorphone: NEGATIVE ng/mL (ref <2.5)
Phencyclidine: NEGATIVE ng/mL (ref <10)
Tapentadol: NEGATIVE ng/mL (ref <5.0)
Temazepam: NEGATIVE ng/mL (ref <0.50)
Tramadol: NEGATIVE ng/mL (ref <5.0)
Triazolam: NEGATIVE ng/mL (ref <0.50)
Zolpidem: NEGATIVE ng/mL (ref <5.0)

## 2022-07-07 LAB — DRUG TOX ALC METAB W/CON, ORAL FLD
Alcohol Metabolite: POSITIVE ng/mL — AB
Ethyl Sulfate: 298 ng/mL — ABNORMAL HIGH

## 2022-07-08 ENCOUNTER — Telehealth: Payer: Self-pay

## 2022-07-08 DIAGNOSIS — M48061 Spinal stenosis, lumbar region without neurogenic claudication: Secondary | ICD-10-CM | POA: Diagnosis not present

## 2022-07-08 DIAGNOSIS — M5442 Lumbago with sciatica, left side: Secondary | ICD-10-CM | POA: Diagnosis not present

## 2022-07-08 DIAGNOSIS — M5126 Other intervertebral disc displacement, lumbar region: Secondary | ICD-10-CM | POA: Diagnosis not present

## 2022-07-08 NOTE — Telephone Encounter (Signed)
Spoke with with patient and told her that per cardiology she could hold her Eliquis for 2 days prior to her procedure.  Patient agreed.

## 2022-07-09 ENCOUNTER — Telehealth: Payer: Self-pay | Admitting: *Deleted

## 2022-07-09 NOTE — Telephone Encounter (Signed)
Oral swab drug screen was consistent for prescribed medications. However it is also positive for alcohol.

## 2022-07-14 ENCOUNTER — Other Ambulatory Visit: Payer: Self-pay | Admitting: *Deleted

## 2022-07-14 ENCOUNTER — Telehealth: Payer: Self-pay | Admitting: *Deleted

## 2022-07-14 DIAGNOSIS — R635 Abnormal weight gain: Secondary | ICD-10-CM

## 2022-07-14 DIAGNOSIS — Z0289 Encounter for other administrative examinations: Secondary | ICD-10-CM

## 2022-07-14 NOTE — Telephone Encounter (Signed)
Received call from patient requesting follow up on referral to [facility]. Pt provided number for clinic and educated that there is a long wait to be seen.  Pt verbalized understanding and appreciative of advice.

## 2022-07-21 ENCOUNTER — Encounter (INDEPENDENT_AMBULATORY_CARE_PROVIDER_SITE_OTHER): Payer: Self-pay | Admitting: Family Medicine

## 2022-07-21 ENCOUNTER — Ambulatory Visit (INDEPENDENT_AMBULATORY_CARE_PROVIDER_SITE_OTHER): Payer: Medicare PPO | Admitting: Family Medicine

## 2022-07-21 VITALS — BP 162/102 | HR 93 | Temp 97.6°F | Ht 66.0 in | Wt 258.0 lb

## 2022-07-21 DIAGNOSIS — F419 Anxiety disorder, unspecified: Secondary | ICD-10-CM | POA: Diagnosis not present

## 2022-07-21 DIAGNOSIS — R5383 Other fatigue: Secondary | ICD-10-CM

## 2022-07-21 DIAGNOSIS — D539 Nutritional anemia, unspecified: Secondary | ICD-10-CM | POA: Diagnosis not present

## 2022-07-21 DIAGNOSIS — F32A Depression, unspecified: Secondary | ICD-10-CM | POA: Diagnosis not present

## 2022-07-21 DIAGNOSIS — R0602 Shortness of breath: Secondary | ICD-10-CM | POA: Diagnosis not present

## 2022-07-21 DIAGNOSIS — I10 Essential (primary) hypertension: Secondary | ICD-10-CM | POA: Diagnosis not present

## 2022-07-21 DIAGNOSIS — Z6841 Body Mass Index (BMI) 40.0 and over, adult: Secondary | ICD-10-CM

## 2022-07-21 DIAGNOSIS — R739 Hyperglycemia, unspecified: Secondary | ICD-10-CM

## 2022-07-21 DIAGNOSIS — E038 Other specified hypothyroidism: Secondary | ICD-10-CM | POA: Diagnosis not present

## 2022-07-21 DIAGNOSIS — E559 Vitamin D deficiency, unspecified: Secondary | ICD-10-CM

## 2022-07-21 DIAGNOSIS — E782 Mixed hyperlipidemia: Secondary | ICD-10-CM

## 2022-07-22 LAB — VITAMIN D 25 HYDROXY (VIT D DEFICIENCY, FRACTURES): Vit D, 25-Hydroxy: 27.6 ng/mL — ABNORMAL LOW (ref 30.0–100.0)

## 2022-07-22 LAB — CBC WITH DIFFERENTIAL/PLATELET
Basophils Absolute: 0 10*3/uL (ref 0.0–0.2)
Basos: 0 %
EOS (ABSOLUTE): 0 10*3/uL (ref 0.0–0.4)
Eos: 1 %
Hematocrit: 39.2 % (ref 34.0–46.6)
Hemoglobin: 13.2 g/dL (ref 11.1–15.9)
Immature Grans (Abs): 0.1 10*3/uL (ref 0.0–0.1)
Immature Granulocytes: 1 %
Lymphocytes Absolute: 1.3 10*3/uL (ref 0.7–3.1)
Lymphs: 17 %
MCH: 33.5 pg — ABNORMAL HIGH (ref 26.6–33.0)
MCHC: 33.7 g/dL (ref 31.5–35.7)
MCV: 100 fL — ABNORMAL HIGH (ref 79–97)
Monocytes Absolute: 0.8 10*3/uL (ref 0.1–0.9)
Monocytes: 10 %
Neutrophils Absolute: 5.5 10*3/uL (ref 1.4–7.0)
Neutrophils: 71 %
Platelets: 241 10*3/uL (ref 150–450)
RBC: 3.94 x10E6/uL (ref 3.77–5.28)
RDW: 13.9 % (ref 11.7–15.4)
WBC: 7.7 10*3/uL (ref 3.4–10.8)

## 2022-07-22 LAB — COMPREHENSIVE METABOLIC PANEL
ALT: 45 IU/L — ABNORMAL HIGH (ref 0–32)
AST: 19 IU/L (ref 0–40)
Albumin/Globulin Ratio: 1.8 (ref 1.2–2.2)
Albumin: 4.4 g/dL (ref 3.8–4.8)
Alkaline Phosphatase: 90 IU/L (ref 44–121)
BUN/Creatinine Ratio: 28 (ref 12–28)
BUN: 24 mg/dL (ref 8–27)
Bilirubin Total: 0.5 mg/dL (ref 0.0–1.2)
CO2: 25 mmol/L (ref 20–29)
Calcium: 8.9 mg/dL (ref 8.7–10.3)
Chloride: 98 mmol/L (ref 96–106)
Creatinine, Ser: 0.85 mg/dL (ref 0.57–1.00)
Globulin, Total: 2.5 g/dL (ref 1.5–4.5)
Glucose: 106 mg/dL — ABNORMAL HIGH (ref 70–99)
Potassium: 4.3 mmol/L (ref 3.5–5.2)
Sodium: 142 mmol/L (ref 134–144)
Total Protein: 6.9 g/dL (ref 6.0–8.5)
eGFR: 73 mL/min/{1.73_m2} (ref >59)

## 2022-07-22 LAB — T3: T3, Total: 63 ng/dL — ABNORMAL LOW (ref 71–180)

## 2022-07-22 LAB — INSULIN, RANDOM: INSULIN: 7.7 u[IU]/mL (ref 2.6–24.9)

## 2022-07-22 LAB — TSH: TSH: 16.5 u[IU]/mL — ABNORMAL HIGH (ref 0.450–4.500)

## 2022-07-22 LAB — HEMOGLOBIN A1C
Est. average glucose Bld gHb Est-mCnc: 120 mg/dL
Hgb A1c MFr Bld: 5.8 % — ABNORMAL HIGH (ref 4.8–5.6)

## 2022-07-22 LAB — LIPID PANEL WITH LDL/HDL RATIO
Cholesterol, Total: 271 mg/dL — ABNORMAL HIGH (ref 100–199)
HDL: 103 mg/dL (ref >39)
LDL Chol Calc (NIH): 140 mg/dL — ABNORMAL HIGH (ref 0–99)
LDL/HDL Ratio: 1.4 ratio (ref 0.0–3.2)
Triglycerides: 165 mg/dL — ABNORMAL HIGH (ref 0–149)
VLDL Cholesterol Cal: 28 mg/dL (ref 5–40)

## 2022-07-22 LAB — T4, FREE: Free T4: 0.62 ng/dL — ABNORMAL LOW (ref 0.82–1.77)

## 2022-07-29 ENCOUNTER — Encounter: Payer: Self-pay | Admitting: *Deleted

## 2022-07-29 ENCOUNTER — Ambulatory Visit: Payer: Medicare PPO | Admitting: Cardiovascular Disease

## 2022-07-29 ENCOUNTER — Other Ambulatory Visit: Payer: Self-pay | Admitting: *Deleted

## 2022-07-29 NOTE — Patient Outreach (Signed)
  Care Coordination   Initial Visit Note   07/29/2022 Name: [name] MRN: [id number] DOB: 1951/08/15  [name] is a 71 y.o. year old female who sees Hemberg, Karie Schwalbe, NP for primary care. I spoke with  [name] by phone today  What matters to the patients health and wellness today?  No needs    Goals Addressed               This Visit's Progress     no needs (pt-stated)        Care Coordination Interventions: Reviewed medications with patient and discussed purpose of all medications Reviewed scheduled/upcoming provider appointments including pending appointments. Advised patient to discuss AWV with provider on an office visit tomorrow verified by pt. Also informed pt to requested addition counsel if desired for her ongoing depression as pt currently on medication.         SDOH assessments and interventions completed:  Yes  SDOH Interventions Today    Flowsheet Row Most Recent Value  SDOH Interventions   Food Insecurity Interventions Intervention Not Indicated  Transportation Interventions Intervention Not Indicated  Depression Interventions/Treatment  Currently on Treatment, Medication        Care Coordination Interventions Activated:  Yes  Care Coordination Interventions:  Yes, provided   Follow up plan: No further intervention required.   Encounter Outcome:  Pt. Visit Completed   Raina Mina, RN Care Management Coordinator [place] Office [phone]

## 2022-08-02 NOTE — Progress Notes (Signed)
Chief Complaint:   [name] (MR# [id number]) is a 71 y.o. female who presents for evaluation and treatment of obesity and related comorbidities. Current BMI is Body mass index is 41.64 kg/m. [name] has been struggling with her weight for many years and has been unsuccessful in either losing weight, maintaining weight loss, or reaching her healthy weight goal.  [name] heard about this clinic from Dr. Lindi Adie. She reports significant weight gain with chemo. Upon review numerous weight discrepancies of weight gains and losses sometimes of 30+ lbs within 6-7 days. She skips breakfast daily. Lunch is --avocado and Core free nutrition drink (satisfied). Late afternoon--cheese and crackers ( 4 cubes and 4 crackers--hungry at that time). Sometimes she skips dinner, she craves traditional farm foods, like beans and corn.  [name] is currently in the action stage of change and ready to dedicate time achieving and maintaining a healthier weight. [name] is interested in becoming our patient and working on intensive lifestyle modifications including (but not limited to) diet and exercise for weight loss.  [name]'s habits were reviewed today and are as follows: Her family eats meals together, she thinks her family will eat healthier with her, her desired weight loss is 103 lbs, she started gaining weight when she had cancer, her heaviest weight ever was 253 lbs pounds, she has significant food cravings issues, she skips meals frequently, she is frequently drinking liquids with calories, she frequently makes poor food choices, she frequently eats larger portions than normal, and she struggles with emotional eating.  Depression Screen [name]'s Food and Mood (modified PHQ-9) score was 17.     07/29/2022    3:38 PM  Depression screen PHQ 2/9  Decreased Interest 2  Down, Depressed, Hopeless 1  PHQ - 2 Score 3  Altered sleeping 2  Tired, decreased energy 2  Change in appetite 1  Feeling bad or failure  about yourself  2  Trouble concentrating 0  Moving slowly or fidgety/restless 0  Suicidal thoughts 0  PHQ-9 Score 10  Difficult doing work/chores Somewhat difficult   Subjective:   1. Other fatigue [name] admits to daytime somnolence and admits to waking up still tired. Patient has a history of symptoms of daytime fatigue and morning fatigue. [name] generally gets 5 or 6 hours of sleep per night, and states that she has difficulty falling asleep and nightime awakenings. Snoring is present. Apneic episodes are not present. Epworth Sleepiness Score is 6.    2. SOBOE (shortness of breath on exertion) [name] notes increasing shortness of breath with exercising and seems to be worsening over time with weight gain. She notes getting out of breath sooner with activity than she used to. This has not gotten worse recently. [name] denies shortness of breath at rest or orthopnea.  3. Vitamin D deficiency [name]'s last Vit D level in Epic, Jan 2022 of 39.  4. Hyperglycemia [name]'s last few blood sugars have been elevated. No history of Prediabetes or diabetes.   5. Macrocytic anemia [name] has a history of B12 deficiency. She has 2-3 alcohol drinks daily.  6. Essential hypertension [name]'s blood pressure elevated today--per review elevated blood pressure on last few readings, she is currently taking Coreg. She sees cardio next week.  7. Other specified hypothyroidism [name]'s last TSH was of 4.6. She is not currently taking Levothyroxine.  8. Mixed hyperlipidemia [name]'s last LDL of 140, HDL of 52, and Trigly of 179. She is not currently taking medication.  9. Anxiety and depression  [name] denies suicidal ideas, and homicidal ideas. She is currently taking Cymblta, Amitriptyline and drinking 2-3 glasses of wine nightly.  Assessment/Plan:   1. Other fatigue [name] does feel that her weight is causing her energy to be lower than it should be. Fatigue may be related to obesity, depression or many other causes.  Labs will be ordered, and in the meanwhile, [name] will focus on self care including making healthy food choices, increasing physical activity and focusing on stress reduction.  2. SOBOE (shortness of breath on exertion) [name] does feel that she gets out of breath more easily that she used to when she exercises. [name]'s shortness of breath appears to be obesity related and exercise induced. She has agreed to work on weight loss and gradually increase exercise to treat her exercise induced shortness of breath. Will continue to monitor closely.   3. Vitamin D deficiency We will obtain labs today.  - VITAMIN D 25 Hydroxy (Vit-D Deficiency, Fractures)  4. Hyperglycemia We will obtain labs today.  - Hemoglobin A1c - Insulin, random  5. Macrocytic anemia We will obtain labs today.  - CBC w/Diff/Platelet  6. Essential hypertension We will obtain labs today. [name] will follow up with cardio at next appointment.  - Comprehensive metabolic panel  7. Other specified hypothyroidism We will obtain labs today.  - TSH - T4, free - T3  8. Mixed hyperlipidemia We will obtain labs today.  - Lipid Panel With LDL/HDL Ratio  9. Anxiety and depression [name] was encouraged to look up therapists and get established with a therapist.  10. Class 3 severe obesity with serious comorbidity and body mass index (BMI) of 40.0 to 44.9 in adult, unspecified obesity type [name]) [name] is currently in the action stage of change and her goal is to continue with weight loss efforts. I recommend [name] begin the structured treatment plan as follows:  She has agreed to the Category 2 Plan.  Exercise goals: No exercise has been prescribed at this time.   Behavioral modification strategies: increasing lean protein intake, no skipping meals, meal planning and cooking strategies, keeping healthy foods in the home, and planning for success.  She was informed of the importance of frequent follow-up visits to maximize her  success with intensive lifestyle modifications for her multiple health conditions. She was informed we would discuss her lab results at her next visit unless there is a critical issue that needs to be addressed sooner. [name] agreed to keep her next visit at the agreed upon time to discuss these results.  Objective:   Blood pressure (!) 162/102, pulse 93, temperature 97.6 F (36.4 C), height '5\' 6"'$  (1.676 m), weight 258 lb (117 kg), SpO2 97 %. Body mass index is 41.64 kg/m.  EKG: Normal sinus rhythm, rate 81 BPM on 04/21/22.  Indirect Calorimeter completed today shows a VO2 of 209 and a REE of 1642.  Her calculated basal metabolic rate is 5732 thus her basal metabolic rate is worse than expected.  General: Cooperative, alert, well developed, in no acute distress. HEENT: Conjunctivae and lids unremarkable. Cardiovascular: Regular rhythm.  Lungs: Normal work of breathing. Neurologic: No focal deficits.   Lab Results  Component Value Date   CREATININE 0.85 07/21/2022   BUN 24 07/21/2022   NA 142 07/21/2022   K 4.3 07/21/2022   CL 98 07/21/2022   CO2 25 07/21/2022   Lab Results  Component Value Date   ALT 45 (H) 07/21/2022   AST 19 07/21/2022   ALKPHOS 90  07/21/2022   BILITOT 0.5 07/21/2022   Lab Results  Component Value Date   HGBA1C 5.8 (H) 07/21/2022   Lab Results  Component Value Date   INSULIN 7.7 07/21/2022   Lab Results  Component Value Date   TSH 16.500 (H) 07/21/2022   Lab Results  Component Value Date   CHOL 271 (H) 07/21/2022   HDL 103 07/21/2022   LDLCALC 140 (H) 07/21/2022   TRIG 165 (H) 07/21/2022   Lab Results  Component Value Date   WBC 7.7 07/21/2022   HGB 13.2 07/21/2022   HCT 39.2 07/21/2022   MCV 100 (H) 07/21/2022   PLT 241 07/21/2022   No results found for: "IRON", "TIBC", "FERRITIN"  Attestation Statements:   Reviewed by clinician on day of visit: allergies, medications, problem list, medical history, surgical history, family history,  social history, and previous encounter notes.  Time spent on visit including pre-visit chart review and post-visit charting and care was 60 minutes.   I, [name], RMA am acting as transcriptionist for [name], MD.  This is the patient's first visit at [facility]. The patient's NEW PATIENT PACKET was reviewed at length. Included in the packet: current and past health history, medications, allergies, ROS, gynecologic history (women only), surgical history, family history, social history, weight history, weight loss surgery history (for those that have had weight loss surgery), nutritional evaluation, mood and food questionnaire, PHQ9, Epworth questionnaire, sleep habits questionnaire, patient life and health improvement goals questionnaire. These will all be scanned into the patient's chart under media.   During the visit, I independently reviewed the patient's EKG, bioimpedance scale results, and indirect calorimeter results. I used this information to tailor a meal plan for the patient that will help her to lose weight and will improve her obesity-related conditions going forward. I performed a medically necessary appropriate examination and/or evaluation. I discussed the assessment and treatment plan with the patient. The patient was provided an opportunity to ask questions and all were answered. The patient agreed with the plan and demonstrated an understanding of the instructions. Labs were ordered at this visit and will be reviewed at the next visit unless more critical results need to be addressed immediately. Clinical information was updated and documented in the EMR.   Time spent on visit including pre-visit chart review and post-visit care was 60 minutes.   A separate 15 minutes was spent on risk counseling (see above).  I have reviewed the above documentation for accuracy and completeness, and I agree with the above. - [name], MD

## 2022-08-04 ENCOUNTER — Ambulatory Visit (INDEPENDENT_AMBULATORY_CARE_PROVIDER_SITE_OTHER): Payer: Medicare PPO | Admitting: Family Medicine

## 2022-08-04 ENCOUNTER — Encounter (INDEPENDENT_AMBULATORY_CARE_PROVIDER_SITE_OTHER): Payer: Self-pay | Admitting: Family Medicine

## 2022-08-04 VITALS — BP 170/102 | HR 82 | Temp 97.7°F | Ht 66.0 in | Wt 259.0 lb

## 2022-08-04 DIAGNOSIS — E7849 Other hyperlipidemia: Secondary | ICD-10-CM | POA: Diagnosis not present

## 2022-08-04 DIAGNOSIS — Z6841 Body Mass Index (BMI) 40.0 and over, adult: Secondary | ICD-10-CM

## 2022-08-04 DIAGNOSIS — I10 Essential (primary) hypertension: Secondary | ICD-10-CM | POA: Diagnosis not present

## 2022-08-04 DIAGNOSIS — R7303 Prediabetes: Secondary | ICD-10-CM

## 2022-08-04 DIAGNOSIS — K760 Fatty (change of) liver, not elsewhere classified: Secondary | ICD-10-CM | POA: Diagnosis not present

## 2022-08-04 DIAGNOSIS — E559 Vitamin D deficiency, unspecified: Secondary | ICD-10-CM | POA: Diagnosis not present

## 2022-08-04 DIAGNOSIS — E038 Other specified hypothyroidism: Secondary | ICD-10-CM | POA: Diagnosis not present

## 2022-08-04 DIAGNOSIS — E669 Obesity, unspecified: Secondary | ICD-10-CM

## 2022-08-04 MED ORDER — VITAMIN D (ERGOCALCIFEROL) 1.25 MG (50000 UNIT) PO CAPS: 50000.0000 [IU] | ORAL_CAPSULE | ORAL | 0 refills | Status: DC

## 2022-08-04 MED ORDER — LEVOTHYROXINE SODIUM 175 MCG PO TABS: 175.0000 ug | ORAL_TABLET | Freq: Every day | ORAL | 0 refills | Status: DC

## 2022-08-13 DIAGNOSIS — G8929 Other chronic pain: Secondary | ICD-10-CM | POA: Diagnosis not present

## 2022-08-13 DIAGNOSIS — M5442 Lumbago with sciatica, left side: Secondary | ICD-10-CM | POA: Diagnosis not present

## 2022-08-13 DIAGNOSIS — M48061 Spinal stenosis, lumbar region without neurogenic claudication: Secondary | ICD-10-CM | POA: Diagnosis not present

## 2022-08-13 DIAGNOSIS — Z79891 Long term (current) use of opiate analgesic: Secondary | ICD-10-CM | POA: Diagnosis not present

## 2022-08-13 DIAGNOSIS — M5441 Lumbago with sciatica, right side: Secondary | ICD-10-CM | POA: Diagnosis not present

## 2022-08-13 DIAGNOSIS — I10 Essential (primary) hypertension: Secondary | ICD-10-CM | POA: Diagnosis not present

## 2022-08-13 NOTE — Progress Notes (Unsigned)
Chief Complaint:   OBESITY [name] is here to discuss her progress with her obesity treatment plan along with follow-up of her obesity related diagnoses. [name] is on the Category 2 Plan and states she is following her eating plan approximately 20% of the time. [name] states she is exercising 0 minutes 0 times per week.  Today's visit was #: 2 Starting weight: 258 lbs Starting date: 07/21/2022 Today's weight: 259 lbs Today's date: 08/04/2022 Total lbs lost to date: 0 lbs Total lbs lost since last in-office visit: 0  Interim History: [name] worked on getting most food in. Was not able to get all food in at any meal during the day. She does not want to eat breakfast. She really struggles with eating mostly any food.  Subjective:   1. NAFLD (nonalcoholic fatty liver disease) [name]'s ALT minimally elevated. CT in 2021 showing mild diffuse steatosis.  2. Other hyperlipidemia [name] not on a statin. LDL minimally elevated.  3. Other specified hypothyroidism [name]'s TSH significantly elevated. T3 and T4 low.  4. Prediabetes [name]'s A1c 5.8, insulin 7.7. No prior history of diagnoses.  5. Essential hypertension [name]'s blood pressure very elevated today. [name] reports she did not take medication consistently.  6. Vitamin D deficiency [name] is not taking Vit D, she notes fatigue.  Assessment/Plan:   1. NAFLD (nonalcoholic fatty liver disease) Will follow up CMP today.  2. Other hyperlipidemia Will obtain FLP in 3 months: discuss medications if LDL does not improve.  3. Other specified hypothyroidism Referred to Endocrinology. Increase Levothyroxine to 175 mcg by mouth daily for 1 month with 0 refills.  -Increase levothyroxine (SYNTHROID) 175 MCG tablet; Take 1 tablet (175 mcg total) by mouth daily.  Dispense: 30 tablet; Refill: 0  - Ambulatory referral to Endocrinology  4. Prediabetes Pathophysiology of IR, prediabetes, diabetes discussed with patient today.  No medication at this  time.  5. Essential hypertension Will follow up with blood pressure at next appointment; medication management per blood pressure at next appointment.  6. Vitamin D deficiency We will refill Vit D 50,000 IU once a week for 1 month with 0 refills.  -Refill Vitamin D, Ergocalciferol, (DRISDOL) 1.25 MG (50000 UNIT) CAPS capsule; Take 1 capsule (50,000 Units total) by mouth every 7 (seven) days.  Dispense: 4 capsule; Refill: 0  7. Obesity with current BMI of 41.9 [name] is currently in the action stage of change. As such, her goal is to continue with weight loss efforts. She has agreed to the Category 2 Plan.   Plan for breakfast--1 slice 45 calorie bread 2 Tbsp PB and 8 oz Fairlife fat free milk. Lunch--tomato sandwich with 45 calorie bread and 2 glasses or Fairlife fat free milk.   Exercise goals: All adults should avoid inactivity. Some physical activity is better than none, and adults who participate in any amount of physical activity gain some health benefits.  Behavioral modification strategies: increasing lean protein intake, meal planning and cooking strategies, keeping healthy foods in the home, and planning for success.  [name] has agreed to follow-up with our clinic in 2 weeks. She was informed of the importance of frequent follow-up visits to maximize her success with intensive lifestyle modifications for her multiple health conditions.   Objective:   Blood pressure (!) 170/102, pulse 82, temperature 97.7 F (36.5 C), height '5\' 6"'$  (1.676 m), weight 259 lb (117.5 kg), SpO2 94 %. Body mass index is 41.8 kg/m.  General: Cooperative, alert, well developed, in no acute distress. HEENT:  Conjunctivae and lids unremarkable. Cardiovascular: Regular rhythm.  Lungs: Normal work of breathing. Neurologic: No focal deficits.   Lab Results  Component Value Date   CREATININE 0.85 07/21/2022   BUN 24 07/21/2022   NA 142 07/21/2022   K 4.3 07/21/2022   CL 98 07/21/2022   CO2 25 07/21/2022    Lab Results  Component Value Date   ALT 45 (H) 07/21/2022   AST 19 07/21/2022   ALKPHOS 90 07/21/2022   BILITOT 0.5 07/21/2022   Lab Results  Component Value Date   HGBA1C 5.8 (H) 07/21/2022   Lab Results  Component Value Date   INSULIN 7.7 07/21/2022   Lab Results  Component Value Date   TSH 16.500 (H) 07/21/2022   Lab Results  Component Value Date   CHOL 271 (H) 07/21/2022   HDL 103 07/21/2022   LDLCALC 140 (H) 07/21/2022   TRIG 165 (H) 07/21/2022   Lab Results  Component Value Date   VD25OH 27.6 (L) 07/21/2022   VD25OH 20 (L) 12/24/2020   VD25OH 27 (L) 11/13/2013   Lab Results  Component Value Date   WBC 7.7 07/21/2022   HGB 13.2 07/21/2022   HCT 39.2 07/21/2022   MCV 100 (H) 07/21/2022   PLT 241 07/21/2022   No results found for: "IRON", "TIBC", "FERRITIN"  Attestation Statements:   Reviewed by clinician on day of visit: allergies, medications, problem list, medical history, surgical history, family history, social history, and previous encounter notes.  I, Elnora Morrison, RMA am acting as transcriptionist for [name], MD. Time spent on visit including pre-visit chart review and post-visit care and charting was 40 minutes I have reviewed the above documentation for accuracy and completeness, and I agree with the above. - [name], MD

## 2022-08-16 ENCOUNTER — Telehealth: Payer: Self-pay | Admitting: Physical Medicine & Rehabilitation

## 2022-08-16 ENCOUNTER — Telehealth: Payer: Self-pay | Admitting: Internal Medicine

## 2022-08-16 NOTE — Telephone Encounter (Signed)
Working on getting PA, Please advise the Dx for this SI injection. The one currently is system for chronic pain/med monitoring will not be sufficient for the procedure being requested.  Thanks

## 2022-08-16 NOTE — Telephone Encounter (Signed)
Inbound call from patient stating that her insurance is not going to cover  PLENVU. Patient is requesting a new prep be sent to her pharmacy. Patient is scheduled to have procedure with Dr. Lorenso Courier on 8/31. Please advise.

## 2022-08-16 NOTE — Telephone Encounter (Signed)
Plenvu sample kit put up front for pick up. Went over prep questions.

## 2022-08-17 DIAGNOSIS — M4807 Spinal stenosis, lumbosacral region: Secondary | ICD-10-CM | POA: Diagnosis not present

## 2022-08-17 DIAGNOSIS — M545 Low back pain, unspecified: Secondary | ICD-10-CM | POA: Diagnosis not present

## 2022-08-17 DIAGNOSIS — G8929 Other chronic pain: Secondary | ICD-10-CM | POA: Diagnosis not present

## 2022-08-17 DIAGNOSIS — Z79891 Long term (current) use of opiate analgesic: Secondary | ICD-10-CM | POA: Diagnosis not present

## 2022-08-18 ENCOUNTER — Telehealth: Payer: Self-pay | Admitting: Internal Medicine

## 2022-08-18 NOTE — Telephone Encounter (Signed)
Patient called, states she took her prep medication earlier than she was supposed to. States her stool is brown after. Requesting a call to know if she needs to continue with the procedure tomorrow or reschedule. Please call to advise.

## 2022-08-18 NOTE — Telephone Encounter (Signed)
Spoke with the patient. She agrees to remain on clear liquids. Instructions reviewed. New copy of instructions and sample prep provided. Reminded not to take Eliquis today or tomorrow.

## 2022-08-19 ENCOUNTER — Telehealth: Payer: Self-pay

## 2022-08-19 ENCOUNTER — Encounter: Payer: Self-pay | Admitting: Internal Medicine

## 2022-08-19 ENCOUNTER — Ambulatory Visit (INDEPENDENT_AMBULATORY_CARE_PROVIDER_SITE_OTHER): Payer: Medicare PPO | Admitting: Family Medicine

## 2022-08-19 ENCOUNTER — Ambulatory Visit: Payer: Medicare PPO | Admitting: Internal Medicine

## 2022-08-19 VITALS — BP 209/123 | HR 88 | Temp 98.2°F | Ht 67.0 in | Wt 256.0 lb

## 2022-08-19 DIAGNOSIS — Z1211 Encounter for screening for malignant neoplasm of colon: Secondary | ICD-10-CM

## 2022-08-19 MED ORDER — SODIUM CHLORIDE 0.9 % IV SOLN: 500.0000 mL | Freq: Once | INTRAVENOUS | Status: DC

## 2022-08-19 NOTE — Telephone Encounter (Signed)
Clinical pharmacist to review Eliquis 

## 2022-08-19 NOTE — Progress Notes (Signed)
Patient's blood pressure was found to be 209/123 prior to her procedure. She had not taken her blood pressure medications this morning. Denies SOB. Decision was made to reschedule the procedure for an alternative date when her blood pressure was lower. I recommended that she talk to her cardiologist about whether or not her BP medications need to be adjusted. I told her to go ahead and restart her Eliquis medication.

## 2022-08-19 NOTE — Telephone Encounter (Signed)
[facility] Pre-operative Risk Assessment     Request for surgical clearance:     Endoscopy Procedure  What type of surgery is being performed?     colonoscopy  When is this surgery scheduled?     10/15/22  What type of clearance is required ?   Pharmacy  Are there any medications that need to be held prior to surgery and how long? Eliquis to be held for 2 days  Practice name and name of physician performing surgery?      [facility]  Dr Sharyn Creamer  What is your office phone and fax number?      Phone- [phone]  [phone]  Anesthesia type (None, local, MAC, general) ?       MAC

## 2022-08-19 NOTE — Progress Notes (Signed)
Pt's states no medical or surgical changes since previsit or office visit. 

## 2022-08-19 NOTE — Telephone Encounter (Signed)
Opened in error

## 2022-08-24 ENCOUNTER — Inpatient Hospital Stay: Admission: RE | Admit: 2022-08-24 | Payer: Medicare PPO | Source: Ambulatory Visit

## 2022-08-25 NOTE — Telephone Encounter (Signed)
   Name: [name]  DOB: [birth year]  MRN: [id number]   Primary Cardiologist: Evalina Field, MD  Chart reviewed as part of pre-operative protocol coverage.   Pharmacologic clearance was requested.  Per office protocol patient can hold Eliquis x2 days prior to the procedure.  She will not need bridging with Lovenox.  She can restart Eliquis as soon as it is safe to do so.  I will route this recommendation to the requesting party via Epic fax function and remove from pre-op pool. Please call with questions.  Elgie Collard, PA-C 08/25/2022, 4:31 PM

## 2022-08-25 NOTE — Telephone Encounter (Signed)
Patient with diagnosis of atrial fibrillation on Eliquis for anticoagulation.    Procedure: colonoscopy Date of procedure: 10/15/22   CHA2DS2-VASc Score = 3   This indicates a 3.2% annual risk of stroke. The patient's score is based upon: CHF History: 0 HTN History: 1 Diabetes History: 0 Stroke History: 0 Vascular Disease History: 0 Age Score: 1 Gender Score: 1   CrCl 111  Platelet count 241  Per office protocol, patient can hold Eliquis for 2 days prior to procedure.   Patient will not need bridging with Lovenox (enoxaparin) around procedure.  **This guidance is not considered finalized until pre-operative APP has relayed final recommendations.**

## 2022-08-26 ENCOUNTER — Encounter: Payer: Medicare PPO | Attending: Physical Medicine & Rehabilitation | Admitting: Physical Medicine & Rehabilitation

## 2022-08-26 DIAGNOSIS — M545 Low back pain, unspecified: Secondary | ICD-10-CM | POA: Insufficient documentation

## 2022-08-26 DIAGNOSIS — G8929 Other chronic pain: Secondary | ICD-10-CM | POA: Insufficient documentation

## 2022-08-30 ENCOUNTER — Other Ambulatory Visit: Payer: Self-pay

## 2022-08-30 ENCOUNTER — Telehealth: Payer: Self-pay

## 2022-08-30 MED ORDER — PLENVU 140 G PO SOLR
ORAL | 0 refills | Status: DC
Start: 2022-08-30 — End: 2022-10-12

## 2022-08-30 NOTE — Telephone Encounter (Signed)
Spoke with the patient. Scheduled for her colonoscopy in the [facility] 10/15/22. She declines to repeat the nurse visit for instructions. Copy of new instructions, including a prescription and Medicare coupon for Plenvu mailed to the patient. Encouraged to call with any questions or concerns.

## 2022-08-31 DIAGNOSIS — Z981 Arthrodesis status: Secondary | ICD-10-CM | POA: Diagnosis not present

## 2022-08-31 DIAGNOSIS — M47816 Spondylosis without myelopathy or radiculopathy, lumbar region: Secondary | ICD-10-CM | POA: Diagnosis not present

## 2022-09-06 ENCOUNTER — Ambulatory Visit (INDEPENDENT_AMBULATORY_CARE_PROVIDER_SITE_OTHER): Payer: Medicare PPO | Admitting: Family Medicine

## 2022-09-06 DIAGNOSIS — M47816 Spondylosis without myelopathy or radiculopathy, lumbar region: Secondary | ICD-10-CM | POA: Diagnosis not present

## 2022-09-07 ENCOUNTER — Other Ambulatory Visit (INDEPENDENT_AMBULATORY_CARE_PROVIDER_SITE_OTHER): Payer: Self-pay | Admitting: Family Medicine

## 2022-09-07 DIAGNOSIS — E038 Other specified hypothyroidism: Secondary | ICD-10-CM

## 2022-09-08 DIAGNOSIS — F411 Generalized anxiety disorder: Secondary | ICD-10-CM | POA: Diagnosis not present

## 2022-09-08 DIAGNOSIS — M48061 Spinal stenosis, lumbar region without neurogenic claudication: Secondary | ICD-10-CM | POA: Diagnosis not present

## 2022-09-08 DIAGNOSIS — M5136 Other intervertebral disc degeneration, lumbar region: Secondary | ICD-10-CM | POA: Diagnosis not present

## 2022-09-08 DIAGNOSIS — I10 Essential (primary) hypertension: Secondary | ICD-10-CM | POA: Diagnosis not present

## 2022-09-08 DIAGNOSIS — F330 Major depressive disorder, recurrent, mild: Secondary | ICD-10-CM | POA: Diagnosis not present

## 2022-09-21 ENCOUNTER — Encounter (INDEPENDENT_AMBULATORY_CARE_PROVIDER_SITE_OTHER): Payer: Self-pay | Admitting: Family Medicine

## 2022-09-21 ENCOUNTER — Ambulatory Visit (INDEPENDENT_AMBULATORY_CARE_PROVIDER_SITE_OTHER): Payer: Medicare PPO | Admitting: Family Medicine

## 2022-09-21 VITALS — BP 135/84 | HR 101 | Temp 98.6°F | Ht 66.0 in | Wt 250.0 lb

## 2022-09-21 DIAGNOSIS — E669 Obesity, unspecified: Secondary | ICD-10-CM

## 2022-09-21 DIAGNOSIS — E559 Vitamin D deficiency, unspecified: Secondary | ICD-10-CM

## 2022-09-21 DIAGNOSIS — E038 Other specified hypothyroidism: Secondary | ICD-10-CM | POA: Diagnosis not present

## 2022-09-21 DIAGNOSIS — Z6841 Body Mass Index (BMI) 40.0 and over, adult: Secondary | ICD-10-CM | POA: Diagnosis not present

## 2022-09-21 MED ORDER — LEVOTHYROXINE SODIUM 175 MCG PO TABS: 175.0000 ug | ORAL_TABLET | Freq: Every day | ORAL | 0 refills | Status: DC

## 2022-09-21 MED ORDER — VITAMIN D (ERGOCALCIFEROL) 1.25 MG (50000 UNIT) PO CAPS: 50000.0000 [IU] | ORAL_CAPSULE | ORAL | 0 refills | Status: DC

## 2022-09-22 NOTE — Progress Notes (Signed)
Chief Complaint:   OBESITY [name] is here to discuss her progress with her obesity treatment plan along with follow-up of her obesity related diagnoses. [name] is on the Category 2 Plan and states she is following her eating plan approximately 70-80% of the time. [name] states she is exercising 0 minutes 0 times per week.  Today's visit was #: 3 Starting weight: 258 lbs Starting date: 07/21/2022 Today's weight: 250 lbs Today's date: 09/21/2022 Total lbs lost to date: 8 lbs Total lbs lost since last in-office visit: 9  Interim History: [name] has been having fairly significant GI issues with diarrhea. Still having issues with minimal appetite and tolerance of food prior to 1-2 pm. Eating meat at night with husband with vegetables. Is drinking the Fairlife milk as protein substitute. Still drinking a bit of Buttermilk.   Subjective:   1. Vitamin D deficiency [name] is currently taking prescription Vit D 50,000 IU once a week. Denies any nausea, vomiting or muscle weakness. She notes fatigue.  2. Other specified hypothyroidism [name]'s last TSH 16.5. On Levothyroxine 175 mcg daily.  Assessment/Plan:   1. Vitamin D deficiency We will refill Vit D 50,000 IU once a week for 1 month with 0 refills.  -Refill Vitamin D, Ergocalciferol, (DRISDOL) 1.25 MG (50000 UNIT) CAPS capsule; Take 1 capsule (50,000 Units total) by mouth every 7 (seven) days.  Dispense: 4 capsule; Refill: 0  2. Other specified hypothyroidism We will refill Levothyroxine 175 mcg daily for 1 month with 0 refills.  -Refill levothyroxine (SYNTHROID) 175 MCG tablet; Take 1 tablet (175 mcg total) by mouth daily.  Dispense: 45 tablet; Refill: 0  3. Obesity with current BMI of 40.5 [name] is currently in the action stage of change. As such, her goal is to continue with weight loss efforts. She has agreed to the Category 2 Plan.   Wants to incorporate Fairlife protein shake for breakfast.   Exercise goals: No exercise has been  prescribed at this time.  Behavioral modification strategies: increasing lean protein intake, meal planning and cooking strategies, keeping healthy foods in the home, and planning for success.  [name] has agreed to follow-up with our clinic in 4 weeks. She was informed of the importance of frequent follow-up visits to maximize her success with intensive lifestyle modifications for her multiple health conditions.   Objective:   Blood pressure 135/84, pulse (!) 101, temperature 98.6 F (37 C), height '5\' 6"'$  (1.676 m), weight 250 lb (113.4 kg), SpO2 98 %. Body mass index is 40.35 kg/m.  General: Cooperative, alert, well developed, in no acute distress. HEENT: Conjunctivae and lids unremarkable. Cardiovascular: Regular rhythm.  Lungs: Normal work of breathing. Neurologic: No focal deficits.   Lab Results  Component Value Date   CREATININE 0.85 07/21/2022   BUN 24 07/21/2022   NA 142 07/21/2022   K 4.3 07/21/2022   CL 98 07/21/2022   CO2 25 07/21/2022   Lab Results  Component Value Date   ALT 45 (H) 07/21/2022   AST 19 07/21/2022   ALKPHOS 90 07/21/2022   BILITOT 0.5 07/21/2022   Lab Results  Component Value Date   HGBA1C 5.8 (H) 07/21/2022   Lab Results  Component Value Date   INSULIN 7.7 07/21/2022   Lab Results  Component Value Date   TSH 16.500 (H) 07/21/2022   Lab Results  Component Value Date   CHOL 271 (H) 07/21/2022   HDL 103 07/21/2022   LDLCALC 140 (H) 07/21/2022   TRIG 165 (H)  07/21/2022   Lab Results  Component Value Date   VD25OH 27.6 (L) 07/21/2022   VD25OH 20 (L) 12/24/2020   VD25OH 27 (L) 11/13/2013   Lab Results  Component Value Date   WBC 7.7 07/21/2022   HGB 13.2 07/21/2022   HCT 39.2 07/21/2022   MCV 100 (H) 07/21/2022   PLT 241 07/21/2022   No results found for: "IRON", "TIBC", "FERRITIN"  Obesity Behavioral Intervention:   Approximately 15 minutes were spent on the discussion below.  ASK: We discussed the diagnosis of obesity  with Stanton Kidney today and Stanton Kidney agreed to give [place] permission to discuss obesity behavioral modification therapy today.  ASSESS: [name] has the diagnosis of obesity and her BMI today is 40.5. [name] is in the action stage of change.   ADVISE: [name] was educated on the multiple health risks of obesity as well as the benefit of weight loss to improve her health. She was advised of the need for long term treatment and the importance of lifestyle modifications to improve her current health and to decrease her risk of future health problems.  AGREE: Multiple dietary modification options and treatment options were discussed and [name] agreed to follow the recommendations documented in the above note.  ARRANGE: [name] was educated on the importance of frequent visits to treat obesity as outlined per CMS and USPSTF guidelines and agreed to schedule her next follow up appointment today.  Attestation Statements:   Reviewed by clinician on day of visit: allergies, medications, problem list, medical history, surgical history, family history, social history, and previous encounter notes.  I, Elnora Morrison, RMA am acting as transcriptionist for [name], MD. I have reviewed the above documentation for accuracy and completeness, and I agree with the above. - [name], MD

## 2022-10-03 ENCOUNTER — Other Ambulatory Visit: Payer: Self-pay | Admitting: Hematology and Oncology

## 2022-10-04 ENCOUNTER — Other Ambulatory Visit: Payer: Self-pay | Admitting: Hematology and Oncology

## 2022-10-04 MED ORDER — DIAZEPAM 10 MG PO TABS: 10.0000 mg | ORAL_TABLET | Freq: Every evening | ORAL | 3 refills | Status: DC | PRN

## 2022-10-12 ENCOUNTER — Other Ambulatory Visit: Payer: Self-pay

## 2022-10-12 MED ORDER — PLENVU 140 G PO SOLR: ORAL | 0 refills | Status: DC

## 2022-10-15 ENCOUNTER — Encounter: Payer: Medicare PPO | Admitting: Internal Medicine

## 2022-10-25 ENCOUNTER — Ambulatory Visit (INDEPENDENT_AMBULATORY_CARE_PROVIDER_SITE_OTHER): Payer: Medicare PPO | Admitting: Family Medicine

## 2022-11-15 ENCOUNTER — Ambulatory Visit: Payer: Medicare PPO | Admitting: Physical Therapy

## 2022-11-17 ENCOUNTER — Emergency Department (HOSPITAL_COMMUNITY): Payer: Medicare PPO

## 2022-11-17 ENCOUNTER — Inpatient Hospital Stay (HOSPITAL_COMMUNITY)
Admission: EM | Admit: 2022-11-17 | Discharge: 2022-11-27 | DRG: 291 | Disposition: A | Discharge status: Home Health | Payer: Medicare PPO | Attending: Internal Medicine | Admitting: Internal Medicine

## 2022-11-17 ENCOUNTER — Other Ambulatory Visit: Payer: Self-pay

## 2022-11-17 ENCOUNTER — Encounter (HOSPITAL_COMMUNITY): Payer: Self-pay | Admitting: Emergency Medicine

## 2022-11-17 DIAGNOSIS — M549 Dorsalgia, unspecified: Secondary | ICD-10-CM | POA: Diagnosis present

## 2022-11-17 DIAGNOSIS — F1014 Alcohol abuse with alcohol-induced mood disorder: Secondary | ICD-10-CM | POA: Diagnosis present

## 2022-11-17 DIAGNOSIS — I110 Hypertensive heart disease with heart failure: Secondary | ICD-10-CM | POA: Diagnosis present

## 2022-11-17 DIAGNOSIS — Z79811 Long term (current) use of aromatase inhibitors: Secondary | ICD-10-CM

## 2022-11-17 DIAGNOSIS — F411 Generalized anxiety disorder: Secondary | ICD-10-CM | POA: Diagnosis present

## 2022-11-17 DIAGNOSIS — Z923 Personal history of irradiation: Secondary | ICD-10-CM

## 2022-11-17 DIAGNOSIS — Z79899 Other long term (current) drug therapy: Secondary | ICD-10-CM

## 2022-11-17 DIAGNOSIS — I4819 Other persistent atrial fibrillation: Secondary | ICD-10-CM | POA: Diagnosis present

## 2022-11-17 DIAGNOSIS — F10280 Alcohol dependence with alcohol-induced anxiety disorder: Secondary | ICD-10-CM | POA: Diagnosis present

## 2022-11-17 DIAGNOSIS — R0602 Shortness of breath: Secondary | ICD-10-CM

## 2022-11-17 DIAGNOSIS — F1024 Alcohol dependence with alcohol-induced mood disorder: Secondary | ICD-10-CM | POA: Diagnosis present

## 2022-11-17 DIAGNOSIS — F1010 Alcohol abuse, uncomplicated: Secondary | ICD-10-CM | POA: Diagnosis present

## 2022-11-17 DIAGNOSIS — M545 Low back pain, unspecified: Secondary | ICD-10-CM | POA: Diagnosis not present

## 2022-11-17 DIAGNOSIS — Z91148 Patient's other noncompliance with medication regimen for other reason: Secondary | ICD-10-CM

## 2022-11-17 DIAGNOSIS — K760 Fatty (change of) liver, not elsewhere classified: Secondary | ICD-10-CM | POA: Diagnosis present

## 2022-11-17 DIAGNOSIS — I5043 Acute on chronic combined systolic (congestive) and diastolic (congestive) heart failure: Secondary | ICD-10-CM | POA: Diagnosis present

## 2022-11-17 DIAGNOSIS — Z1152 Encounter for screening for COVID-19: Secondary | ICD-10-CM

## 2022-11-17 DIAGNOSIS — F418 Other specified anxiety disorders: Secondary | ICD-10-CM | POA: Diagnosis not present

## 2022-11-17 DIAGNOSIS — G8929 Other chronic pain: Secondary | ICD-10-CM | POA: Diagnosis present

## 2022-11-17 DIAGNOSIS — Z7901 Long term (current) use of anticoagulants: Secondary | ICD-10-CM

## 2022-11-17 DIAGNOSIS — Z96612 Presence of left artificial shoulder joint: Secondary | ICD-10-CM | POA: Diagnosis present

## 2022-11-17 DIAGNOSIS — Z170 Estrogen receptor positive status [ER+]: Secondary | ICD-10-CM | POA: Diagnosis not present

## 2022-11-17 DIAGNOSIS — Z87891 Personal history of nicotine dependence: Secondary | ICD-10-CM

## 2022-11-17 DIAGNOSIS — E039 Hypothyroidism, unspecified: Secondary | ICD-10-CM | POA: Diagnosis present

## 2022-11-17 DIAGNOSIS — R6889 Other general symptoms and signs: Principal | ICD-10-CM

## 2022-11-17 DIAGNOSIS — Z6841 Body Mass Index (BMI) 40.0 and over, adult: Secondary | ICD-10-CM | POA: Diagnosis not present

## 2022-11-17 DIAGNOSIS — Z8349 Family history of other endocrine, nutritional and metabolic diseases: Secondary | ICD-10-CM

## 2022-11-17 DIAGNOSIS — Z6372 Alcoholism and drug addiction in family: Secondary | ICD-10-CM

## 2022-11-17 DIAGNOSIS — F1110 Opioid abuse, uncomplicated: Secondary | ICD-10-CM | POA: Diagnosis present

## 2022-11-17 DIAGNOSIS — Z981 Arthrodesis status: Secondary | ICD-10-CM

## 2022-11-17 DIAGNOSIS — J811 Chronic pulmonary edema: Secondary | ICD-10-CM | POA: Diagnosis present

## 2022-11-17 DIAGNOSIS — R053 Chronic cough: Secondary | ICD-10-CM | POA: Diagnosis present

## 2022-11-17 DIAGNOSIS — Z79890 Hormone replacement therapy (postmenopausal): Secondary | ICD-10-CM

## 2022-11-17 DIAGNOSIS — I2489 Other forms of acute ischemic heart disease: Secondary | ICD-10-CM | POA: Diagnosis present

## 2022-11-17 DIAGNOSIS — I10 Essential (primary) hypertension: Secondary | ICD-10-CM | POA: Diagnosis present

## 2022-11-17 DIAGNOSIS — J9601 Acute respiratory failure with hypoxia: Secondary | ICD-10-CM | POA: Diagnosis present

## 2022-11-17 DIAGNOSIS — Z888 Allergy status to other drugs, medicaments and biological substances status: Secondary | ICD-10-CM

## 2022-11-17 DIAGNOSIS — F10231 Alcohol dependence with withdrawal delirium: Secondary | ICD-10-CM | POA: Diagnosis not present

## 2022-11-17 DIAGNOSIS — E876 Hypokalemia: Secondary | ICD-10-CM | POA: Diagnosis present

## 2022-11-17 DIAGNOSIS — G4700 Insomnia, unspecified: Secondary | ICD-10-CM | POA: Diagnosis present

## 2022-11-17 DIAGNOSIS — Z833 Family history of diabetes mellitus: Secondary | ICD-10-CM

## 2022-11-17 DIAGNOSIS — R7303 Prediabetes: Secondary | ICD-10-CM | POA: Diagnosis present

## 2022-11-17 DIAGNOSIS — Z818 Family history of other mental and behavioral disorders: Secondary | ICD-10-CM

## 2022-11-17 DIAGNOSIS — I5033 Acute on chronic diastolic (congestive) heart failure: Secondary | ICD-10-CM | POA: Diagnosis not present

## 2022-11-17 DIAGNOSIS — C50211 Malignant neoplasm of upper-inner quadrant of right female breast: Secondary | ICD-10-CM

## 2022-11-17 DIAGNOSIS — Z9221 Personal history of antineoplastic chemotherapy: Secondary | ICD-10-CM

## 2022-11-17 DIAGNOSIS — Z853 Personal history of malignant neoplasm of breast: Secondary | ICD-10-CM

## 2022-11-17 DIAGNOSIS — F32A Depression, unspecified: Secondary | ICD-10-CM | POA: Diagnosis present

## 2022-11-17 DIAGNOSIS — F10180 Alcohol abuse with alcohol-induced anxiety disorder: Secondary | ICD-10-CM | POA: Diagnosis present

## 2022-11-17 DIAGNOSIS — J9602 Acute respiratory failure with hypercapnia: Secondary | ICD-10-CM | POA: Diagnosis present

## 2022-11-17 DIAGNOSIS — I509 Heart failure, unspecified: Secondary | ICD-10-CM | POA: Diagnosis not present

## 2022-11-17 DIAGNOSIS — Z823 Family history of stroke: Secondary | ICD-10-CM

## 2022-11-17 DIAGNOSIS — Z8249 Family history of ischemic heart disease and other diseases of the circulatory system: Secondary | ICD-10-CM

## 2022-11-17 DIAGNOSIS — Z811 Family history of alcohol abuse and dependence: Secondary | ICD-10-CM

## 2022-11-17 LAB — CBC WITH DIFFERENTIAL/PLATELET
Abs Immature Granulocytes: 0.03 10*3/uL (ref 0.00–0.07)
Basophils Absolute: 0 10*3/uL (ref 0.0–0.1)
Basophils Relative: 0 %
Eosinophils Absolute: 0.1 10*3/uL (ref 0.0–0.5)
Eosinophils Relative: 2 %
HCT: 45.4 % (ref 36.0–46.0)
Hemoglobin: 14.3 g/dL (ref 12.0–15.0)
Immature Granulocytes: 0 %
Lymphocytes Relative: 19 %
Lymphs Abs: 1.3 10*3/uL (ref 0.7–4.0)
MCH: 33.3 pg (ref 26.0–34.0)
MCHC: 31.5 g/dL (ref 30.0–36.0)
MCV: 105.8 fL — ABNORMAL HIGH (ref 80.0–100.0)
Monocytes Absolute: 0.7 10*3/uL (ref 0.1–1.0)
Monocytes Relative: 11 %
Neutro Abs: 4.6 10*3/uL (ref 1.7–7.7)
Neutrophils Relative %: 68 %
Platelets: 225 10*3/uL (ref 150–400)
RBC: 4.29 MIL/uL (ref 3.87–5.11)
RDW: 14.8 % (ref 11.5–15.5)
WBC: 6.8 10*3/uL (ref 4.0–10.5)
nRBC: 0 % (ref 0.0–0.2)

## 2022-11-17 LAB — RESP PANEL BY RT-PCR (FLU A&B, COVID) ARPGX2
Influenza A by PCR: NEGATIVE
Influenza B by PCR: NEGATIVE
SARS Coronavirus 2 by RT PCR: NEGATIVE

## 2022-11-17 LAB — COMPREHENSIVE METABOLIC PANEL
ALT: 14 U/L (ref 0–44)
AST: 18 U/L (ref 15–41)
Albumin: 3.5 g/dL (ref 3.5–5.0)
Alkaline Phosphatase: 86 U/L (ref 38–126)
Anion gap: 9 (ref 5–15)
BUN: 27 mg/dL — ABNORMAL HIGH (ref 8–23)
CO2: 36 mmol/L — ABNORMAL HIGH (ref 22–32)
Calcium: 8.8 mg/dL — ABNORMAL LOW (ref 8.9–10.3)
Chloride: 100 mmol/L (ref 98–111)
Creatinine, Ser: 0.97 mg/dL (ref 0.44–1.00)
GFR, Estimated: >60 mL/min (ref >60)
Glucose, Bld: 107 mg/dL — ABNORMAL HIGH (ref 70–99)
Potassium: 2.8 mmol/L — ABNORMAL LOW (ref 3.5–5.1)
Sodium: 145 mmol/L (ref 135–145)
Total Bilirubin: 0.6 mg/dL (ref 0.3–1.2)
Total Protein: 6.7 g/dL (ref 6.5–8.1)

## 2022-11-17 LAB — URINALYSIS, ROUTINE W REFLEX MICROSCOPIC
Bacteria, UA: NONE SEEN
Bilirubin Urine: NEGATIVE
Glucose, UA: NEGATIVE mg/dL
Hgb urine dipstick: NEGATIVE
Ketones, ur: 5 mg/dL — AB
Leukocytes,Ua: NEGATIVE
Nitrite: NEGATIVE
Protein, ur: NEGATIVE mg/dL
Specific Gravity, Urine: 1.016 (ref 1.005–1.030)
pH: 5 (ref 5.0–8.0)

## 2022-11-17 LAB — TROPONIN I (HIGH SENSITIVITY)
Troponin I (High Sensitivity): 45 ng/L — ABNORMAL HIGH (ref <18)
Troponin I (High Sensitivity): 48 ng/L — ABNORMAL HIGH (ref <18)

## 2022-11-17 LAB — BRAIN NATRIURETIC PEPTIDE: B Natriuretic Peptide: 104.6 pg/mL — ABNORMAL HIGH (ref 0.0–100.0)

## 2022-11-17 MED ORDER — CARVEDILOL 12.5 MG PO TABS
12.5000 mg | ORAL_TABLET | Freq: Once | ORAL | Status: AC
  Administered 2022-11-17: 12.5 mg via ORAL
  Filled 2022-11-17: qty 1

## 2022-11-17 MED ORDER — SODIUM CHLORIDE 0.9% FLUSH: 3.0000 mL | INTRAVENOUS | Status: DC | PRN

## 2022-11-17 MED ORDER — ONDANSETRON HCL 4 MG/2ML IJ SOLN
4.0000 mg | Freq: Four times a day (QID) | INTRAMUSCULAR | Status: DC | PRN
  Filled 2022-11-17: qty 2

## 2022-11-17 MED ORDER — FUROSEMIDE 10 MG/ML IJ SOLN
40.0000 mg | Freq: Once | INTRAMUSCULAR | Status: AC
  Administered 2022-11-17: 40 mg via INTRAVENOUS
  Filled 2022-11-17: qty 4

## 2022-11-17 MED ORDER — THIAMINE HCL 100 MG/ML IJ SOLN
100.0000 mg | Freq: Every day | INTRAMUSCULAR | Status: DC
  Administered 2022-11-19: 100 mg via INTRAVENOUS
  Filled 2022-11-17 (×2): qty 2

## 2022-11-17 MED ORDER — OXYCODONE-ACETAMINOPHEN 5-325 MG PO TABS
1.0000 | ORAL_TABLET | Freq: Once | ORAL | Status: AC
  Administered 2022-11-17: 1 via ORAL
  Filled 2022-11-17: qty 1

## 2022-11-17 MED ORDER — LORAZEPAM 2 MG/ML IJ SOLN
1.0000 mg | INTRAMUSCULAR | Status: DC | PRN
  Administered 2022-11-18 – 2022-11-19 (×3): 2 mg via INTRAVENOUS
  Filled 2022-11-17 (×4): qty 1

## 2022-11-17 MED ORDER — FOLIC ACID 1 MG PO TABS
1.0000 mg | ORAL_TABLET | Freq: Every day | ORAL | Status: DC
  Administered 2022-11-18 – 2022-11-27 (×10): 1 mg via ORAL
  Filled 2022-11-17 (×10): qty 1

## 2022-11-17 MED ORDER — ACETAMINOPHEN 325 MG PO TABS: 650.0000 mg | ORAL_TABLET | ORAL | Status: DC | PRN

## 2022-11-17 MED ORDER — LORAZEPAM 1 MG PO TABS
1.0000 mg | ORAL_TABLET | ORAL | Status: DC | PRN
  Administered 2022-11-18: 1 mg via ORAL
  Administered 2022-11-18: 2 mg via ORAL
  Filled 2022-11-17: qty 2
  Filled 2022-11-17: qty 1

## 2022-11-17 MED ORDER — POTASSIUM CHLORIDE CRYS ER 20 MEQ PO TBCR
40.0000 meq | EXTENDED_RELEASE_TABLET | Freq: Once | ORAL | Status: AC
  Administered 2022-11-17: 40 meq via ORAL
  Filled 2022-11-17: qty 2

## 2022-11-17 MED ORDER — SODIUM CHLORIDE 0.9% FLUSH
3.0000 mL | Freq: Two times a day (BID) | INTRAVENOUS | Status: DC
  Administered 2022-11-17 – 2022-11-24 (×14): 3 mL via INTRAVENOUS

## 2022-11-17 MED ORDER — IOHEXOL 350 MG/ML SOLN
100.0000 mL | Freq: Once | INTRAVENOUS | Status: AC | PRN
  Administered 2022-11-17: 100 mL via INTRAVENOUS

## 2022-11-17 MED ORDER — APIXABAN 5 MG PO TABS
5.0000 mg | ORAL_TABLET | Freq: Two times a day (BID) | ORAL | Status: DC
  Administered 2022-11-18 – 2022-11-27 (×18): 5 mg via ORAL
  Filled 2022-11-17 (×19): qty 1

## 2022-11-17 MED ORDER — TRAMADOL HCL 50 MG PO TABS
50.0000 mg | ORAL_TABLET | Freq: Once | ORAL | Status: AC
  Administered 2022-11-18: 50 mg via ORAL
  Filled 2022-11-17: qty 1

## 2022-11-17 MED ORDER — POTASSIUM CHLORIDE 10 MEQ/100ML IV SOLN
10.0000 meq | Freq: Once | INTRAVENOUS | Status: AC
  Administered 2022-11-17: 10 meq via INTRAVENOUS
  Filled 2022-11-17: qty 100

## 2022-11-17 MED ORDER — ADULT MULTIVITAMIN W/MINERALS CH
1.0000 | ORAL_TABLET | Freq: Every day | ORAL | Status: DC
  Administered 2022-11-18 – 2022-11-26 (×9): 1 via ORAL
  Filled 2022-11-17 (×10): qty 1

## 2022-11-17 MED ORDER — ACETAMINOPHEN 500 MG PO TABS
1000.0000 mg | ORAL_TABLET | Freq: Once | ORAL | Status: AC
  Administered 2022-11-17: 1000 mg via ORAL
  Filled 2022-11-17: qty 2

## 2022-11-17 MED ORDER — THIAMINE MONONITRATE 100 MG PO TABS
100.0000 mg | ORAL_TABLET | Freq: Every day | ORAL | Status: DC
  Administered 2022-11-18 – 2022-11-21 (×3): 100 mg via ORAL
  Filled 2022-11-17 (×3): qty 1

## 2022-11-17 MED ORDER — HYDRALAZINE HCL 20 MG/ML IJ SOLN: 10.0000 mg | Freq: Four times a day (QID) | INTRAMUSCULAR | Status: DC | PRN

## 2022-11-17 MED ORDER — ANASTROZOLE 1 MG PO TABS
1.0000 mg | ORAL_TABLET | Freq: Every day | ORAL | Status: DC
  Administered 2022-11-18 – 2022-11-27 (×10): 1 mg via ORAL
  Filled 2022-11-17 (×10): qty 1

## 2022-11-17 MED ORDER — CYCLOBENZAPRINE HCL 10 MG PO TABS
10.0000 mg | ORAL_TABLET | Freq: Once | ORAL | Status: AC
  Administered 2022-11-17: 10 mg via ORAL
  Filled 2022-11-17: qty 1

## 2022-11-17 MED ORDER — SODIUM CHLORIDE 0.9 % IV SOLN
250.0000 mL | INTRAVENOUS | Status: DC | PRN
  Administered 2022-11-24: 250 mL via INTRAVENOUS

## 2022-11-17 NOTE — Assessment & Plan Note (Signed)
K of 2.8 on presentation.  -replete with IV K and oral K in ED. Follow replete in the morning.

## 2022-11-17 NOTE — ED Provider Notes (Signed)
[facility] DEPT Provider Note   CSN: [id number] Arrival date & time: 11/17/22  1454     History  Chief Complaint  Patient presents with   Hypoxia    [name] is a 71 y.o. female. Patient presents to the emergency department via EMS with a chief complaint of low oxygen saturations. Patient was at her PCP for a routine appointment. O2 saturation was noted to be 77% on RA. EMS placed the patient on 2lpm oxygen via nasal cannula and her saturations reportedly improved. Upon hospital arrival room air saturations were noted to be 83% and the patient was again placed on 2lpm via nasal cannula. Patient endorses shortness of breath. Her husband states that she has seemed to have problems with worsening shortness of breath over the past month. She currently denies chest pain, palpitations, abdominal pain, nausea, vomiting, dysuria, headache.  Past medical history significant for encephalopathy, chronic back pain, alcohol abuse, hypertension, hypokalemia, macrocytic anemia, anxiety, obesity, benzodiazepine dependence, spinal stenosis  HPI     Home Medications Prior to Admission medications   Medication Sig Start Date End Date Taking? Authorizing Provider  acetaminophen (TYLENOL) 500 MG tablet Take 1,000 mg by mouth every 6 (six) hours as needed for mild pain.    [provider]  amitriptyline (ELAVIL) 25 MG tablet Take 25-75 mg by mouth at bedtime as needed for sleep.    [provider]  anastrozole (ARIMIDEX) 1 MG tablet TAKE ONE TABLET BY MOUTH DAILY. 04/20/22   Nicholas Lose, MD  Calcium-Magnesium-Zinc 500-250-12.5 MG TABS Take 1 tablet by mouth daily.    [provider]  carvedilol (COREG) 12.5 MG tablet Take 1 tablet (12.5 mg total) by mouth 2 (two) times daily. 06/15/22   [facility], Tami Lin, PA  diazepam (VALIUM) 10 MG tablet Take 1 tablet (10 mg total) by mouth at bedtime as needed for anxiety. 10/04/22   Nicholas Lose, MD   dicyclomine (BENTYL) 10 MG capsule Take 10 mg by mouth 4 (four) times daily -  before meals and at bedtime.    [provider]  DULoxetine (CYMBALTA) 60 MG capsule Take 60 mg by mouth 2 (two) times daily. 06/18/22   [provider]  ELIQUIS 5 MG TABS tablet TAKE ONE TABLET BY MOUTH TWICE DAILY. 06/08/22   O'NealCassie Freer, MD  furosemide (LASIX) 20 MG tablet Take 20-40 mg by mouth daily as needed for fluid or edema.    [provider]  levothyroxine (SYNTHROID) 175 MCG tablet Take 1 tablet (175 mcg total) by mouth daily. 09/21/22   Laqueta Linden, MD  PEG-KCl-NaCl-NaSulf-Na Asc-C (PLENVU) 140 g SOLR Following the doctor's instructions, begin the evening before the procedure at 6pm 10/12/22   Sharyn Creamer, MD  potassium chloride (KLOR-CON) 10 MEQ tablet Take 10 mEq by mouth daily. May take 1 additional tablet ( 10 MeQ) if taking lasix. 03/11/22   [provider]  tiZANidine (ZANAFLEX) 2 MG tablet Take 2 mg by mouth every 6 (six) hours as needed for muscle spasms.    [provider]  Vitamin D, Ergocalciferol, (DRISDOL) 1.25 MG (50000 UNIT) CAPS capsule Take 1 capsule (50,000 Units total) by mouth every 7 (seven) days. 09/21/22   Laqueta Linden, MD      Allergies    Zolpidem tartrate    Review of Systems   Review of Systems  Constitutional:  Negative for fever.  HENT:  Negative for congestion.   Respiratory:  Positive for shortness  of breath. Negative for cough and wheezing.   Cardiovascular:  Positive for leg swelling (Mild). Negative for chest pain and palpitations.  Gastrointestinal:  Negative for abdominal pain, nausea and vomiting.  Genitourinary:  Negative for dysuria.  Musculoskeletal:  Positive for back pain (Chronic).  Neurological:  Negative for headaches.    Physical Exam Updated Vital Signs BP 129/85   Pulse 76   Temp 98.8 F (37.1 C) (Oral)   Resp (!) 24   Ht '5\' 6"'$  (1.676 m)   Wt 113.4 kg   LMP  (LMP Unknown)    SpO2 97%   BMI 40.35 kg/m  Physical Exam Vitals and nursing note reviewed.  Constitutional:      General: She is not in acute distress.    Appearance: She is well-developed.  HENT:     Head: Normocephalic and atraumatic.  Eyes:     Conjunctiva/sclera: Conjunctivae normal.  Cardiovascular:     Rate and Rhythm: Normal rate and regular rhythm.     Heart sounds: No murmur heard. Pulmonary:     Effort: Pulmonary effort is normal. No respiratory distress.     Breath sounds: Normal breath sounds.  Abdominal:     Palpations: Abdomen is soft.     Tenderness: There is no abdominal tenderness.  Musculoskeletal:        General: No swelling.     Cervical back: Neck supple.  Skin:    General: Skin is warm and dry.     Capillary Refill: Capillary refill takes less than 2 seconds.  Neurological:     Mental Status: She is alert.  Psychiatric:        Mood and Affect: Mood normal.     ED Results / Procedures / Treatments   Labs (all labs ordered are listed, but only abnormal results are displayed) Labs Reviewed  COMPREHENSIVE METABOLIC PANEL - Abnormal; Notable for the following components:      Result Value   Potassium 2.8 (*)    CO2 36 (*)    Glucose, Bld 107 (*)    BUN 27 (*)    Calcium 8.8 (*)    All other components within normal limits  CBC WITH DIFFERENTIAL/PLATELET - Abnormal; Notable for the following components:   MCV 105.8 (*)    All other components within normal limits  BRAIN NATRIURETIC PEPTIDE - Abnormal; Notable for the following components:   B Natriuretic Peptide 104.6 (*)    All other components within normal limits  URINALYSIS, ROUTINE W REFLEX MICROSCOPIC - Abnormal; Notable for the following components:   Ketones, ur 5 (*)    All other components within normal limits  TROPONIN I (HIGH SENSITIVITY) - Abnormal; Notable for the following components:   Troponin I (High Sensitivity) 45 (*)    All other components within normal limits  TROPONIN I (HIGH  SENSITIVITY) - Abnormal; Notable for the following components:   Troponin I (High Sensitivity) 48 (*)    All other components within normal limits  RESP PANEL BY RT-PCR (FLU A&B, COVID) [id number]    EKG EKG Interpretation  Date/Time:  Wednesday November 17 2022 15:09:49 EST Ventricular Rate:  90 PR Interval:    QRS Duration: 66 QT Interval:  472 QTC Calculation: 578 R Axis:   -31 Text Interpretation: Atrial fibrillation Left axis deviation Low voltage, precordial leads Borderline T wave abnormalities Prolonged QT interval Confirmed by Regan Lemming ([id number]) on 11/17/2022 3:10:59 PM  Radiology CT Angio Chest PE W and/or Wo Contrast  Result Date: 11/17/2022  CLINICAL DATA:  Shortness of breath on exertion with hypoxia, initial encounter EXAM: CT ANGIOGRAPHY CHEST WITH CONTRAST TECHNIQUE: Multidetector CT imaging of the chest was performed using the standard protocol during bolus administration of intravenous contrast. Multiplanar CT image reconstructions and MIPs were obtained to evaluate the vascular anatomy. RADIATION DOSE REDUCTION: This exam was performed according to the departmental dose-optimization program which includes automated exposure control, adjustment of the mA and/or kV according to patient size and/or use of iterative reconstruction technique. CONTRAST:  184m OMNIPAQUE IOHEXOL 350 MG/ML SOLN COMPARISON:  Chest x-ray from earlier in the same day. FINDINGS: Cardiovascular: Thoracic aorta shows atherosclerotic calcifications. No aneurysmal dilatation is noted. No cardiac enlargement is seen. Coronary calcifications are seen. The pulmonary artery shows a normal branching pattern. No filling pulmonary embolism is noted. Mediastinum/Nodes: Thoracic inlet is within normal limits. No hilar or mediastinal adenopathy is noted. The esophagus is within normal limits. Lungs/Pleura: Lungs are well aerated bilaterally. No focal infiltrate or sizable effusion is seen. Mosaic attenuation is noted  consistent with air trapping. No pneumothorax is seen. Upper Abdomen: Visualized upper abdomen demonstrates evidence of cholelithiasis without complicating factors. No other acute abnormality is seen. Musculoskeletal: No acute bony abnormality is noted. Review of the MIP images confirms the above findings. IMPRESSION: No evidence of pulmonary emboli. Cholelithiasis without complicating factors. Electronically Signed   By: MInez CatalinaM.D.   On: 11/17/2022 21:51   DG Chest 2 View  Result Date: 11/17/2022 CLINICAL DATA:  Sob EXAM: PORTABLE CHEST 1 VIEW COMPARISON:  04/08/2022 FINDINGS: Cardiac silhouette is prominent. There is pulmonary interstitial prominence with vascular congestion. No focal consolidation. No pneumothorax or pleural effusion identified. Osseous structures are osteopenic. There is a left shoulder prosthesis. IMPRESSION: Findings suggest CHF. Electronically Signed   By: JSammie BenchM.D.   On: 11/17/2022 16:24    Procedures Procedures    Medications Ordered in ED Medications  acetaminophen (TYLENOL) tablet 1,000 mg (1,000 mg Oral Given 11/17/22 1622)  cyclobenzaprine (FLEXERIL) tablet 10 mg (10 mg Oral Given 11/17/22 1622)  potassium chloride 10 mEq in 100 mL IVPB (0 mEq Intravenous Stopped 11/17/22 1820)  potassium chloride SA (KLOR-CON M) CR tablet 40 mEq (40 mEq Oral Given 11/17/22 1721)  furosemide (LASIX) injection 40 mg (40 mg Intravenous Given 11/17/22 1757)  oxyCODONE-acetaminophen (PERCOCET/ROXICET) 5-325 MG per tablet 1 tablet (1 tablet Oral Given 11/17/22 1846)  furosemide (LASIX) injection 40 mg (40 mg Intravenous Given 11/17/22 1846)  carvedilol (COREG) tablet 12.5 mg (12.5 mg Oral Given 11/17/22 1930)  iohexol (OMNIPAQUE) 350 MG/ML injection 100 mL (100 mLs Intravenous Contrast Given 11/17/22 2135)    ED Course/ Medical Decision Making/ A&P                           Medical Decision Making Amount and/or Complexity of Data Reviewed Radiology:  ordered.  Risk OTC drugs. Prescription drug management.   This patient presents to the ED for concern of shortness of breath and hypoxia, this involves an extensive number of treatment options, and is a complaint that carries with it a high risk of complications and morbidity.  The differential diagnosis includes fluid overload, PE, viral illness, sepsis, pneumonia, and others   Co morbidities that complicate the patient evaluation  History of Lasix use, questionable CHF   Additional history obtained:  Additional history obtained from husband, EMS External records from outside source obtained and reviewed including cardiology notes from March 2022 showing history of  atrial fibrillation and evaluation for same.  Note reports that patient takes Lasix due to fluid retention in her lower extremity due to a previous injury.  Patient also on Eliquis due to persistent atrial fibrillation   Lab Tests:  I Ordered, and personally interpreted labs.  The pertinent results include: BNP 104.6, initial troponin 45, repeat 48; negative COVID and influenza testing; unremarkable UA; unremarkable CBC; potassium 2.8   Imaging Studies ordered:  I ordered imaging studies including chest x-ray and CT angio PE study I independently visualized and interpreted imaging which showed likely CHF on chest x-ray. No evidence of pulmonary emboli.    Cholelithiasis without complicating factors.   I agree with the radiologist interpretation   Cardiac Monitoring: / EKG:  The patient was maintained on a cardiac monitor.  I personally viewed and interpreted the cardiac monitored which showed an underlying rhythm of: Atrial fibrillation   Consultations Obtained:  I requested consultation with the hospitalist, Dr.Tu and discussed lab and imaging findings as well as pertinent plan - they recommend: admission   Problem List / ED Course / Critical interventions / Medication management   I ordered medication  including Lasix for fluid retention, Percocet, Tylenol, Flexeril for chronic back pain, carvedilol for hypertension  Reevaluation of the patient after these medicines showed that the patient improved I have reviewed the patients home medicines and have made adjustments as needed   Social Determinants of Health:  Patient lives at home with her husband.   Test / Admission - Considered:  Patient ambulated and oxygen saturation dropped to 89% on room air.  I reevaluated the patient at bedside while discussing results with the patient she began to desat into the low 80s on room air with no activity.  Patient will need admission for further evaluation and management.        Final Clinical Impression(s) / ED Diagnoses Final diagnoses:  Increased oxygen demand  Shortness of breath    Rx / DC Orders ED Discharge Orders     None         Ronny Bacon 11/17/22 2218    Regan Lemming, MD 11/18/22 2130

## 2022-11-17 NOTE — Assessment & Plan Note (Signed)
-  Hx of right breast cancer s/p lumpectomy, adjuvant chemotherapy, radiation, herceptin maintenance and currently on antiestrogen therapy -follows with Dr. Lindi Adie oncology

## 2022-11-17 NOTE — H&P (Signed)
History and Physical    Patient: [name] BVQ:[id number] DOB: [birth year] DOA: 11/17/2022 DOS: the patient was seen and examined on 11/18/2022 PCP: Bridget Hartshorn, NP  Patient coming from: Home  Chief Complaint:  Chief Complaint  Patient presents with   Hypoxia   HPI: [name] is a 71 y.o. female with medical history significant of HTN, pre-diabetes, hypothyroidism, NAFLD, Hx of right breast cancer s/p lumpectomy, adjuvant chemotherapy, radiation, herceptin maintenance and currently on antiestrogen therapy, persistent atrial fibrillation on Eliquis who presents with hypoxia.   Pt reports wheezing,productive cough, difficulty sleeping for the past month. Has had increasing LE edema for the past 6 months up to her knees. No hx of tobacco use. No fever. Reports not being good about taking her medications daily especially her antihypertensives. Has noticed for some time that her SBP goes up to 170. She has Lasix 20-'40mg'$  as needed but does not know how much she was taking.   In the ED she was requiring 3L [place], BP of 190/105, Temperature of 98.8. WBC of 6.8, Hgb of 14.  Na of 145, K of 2.8, creatinine of 0.97.  BNP of 104, troponin of 45-48. EKG on my review in atrial fibrillation and non-specific T wave changes in the inferolateral lead which are new from prior.   CXR with possible pulmonary congestion on my review.   She was given IV '80mg'$  of Lasix and potassium was repleted with IV 61mq and '40mg'$  oral K in ED. Hospitalist consulted for management of acute hypoxic.    Review of Systems: As mentioned in the history of present illness. All other systems reviewed and are negative. Past Medical History:  Diagnosis Date   Anxiety    Atrial fibrillation ([facility]    sees Dr. OAudie Box  Bilateral swelling of feet    Cancer ([facility]    breast   Chronic diarrhea    Chronic pain    Concussion 02/2007   ICU x 3 days   Depression    Hypertension    Hypothyroidism    Personal  history of chemotherapy    Personal history of radiation therapy    Spinal stenosis    Thyroid disease ?1994   Vitamin D deficiency    Past Surgical History:  Procedure Laterality Date   BREAST BIOPSY Right 12/20/2019   x2   BREAST LUMPECTOMY Right 01/22/2020   BREAST LUMPECTOMY WITH RADIOACTIVE SEED AND SENTINEL LYMPH NODE BIOPSY Right 01/22/2020   Procedure: RIGHT BREAST LUMPECTOMY WITH RADIOACTIVE SEED X2 AND RIGHT SENTINEL LYMPH NODE MAPPING;  Surgeon: CErroll Luna MD;  [place] [facility]  Service: General;  Laterality: Right;   BREAST SURGERY Right 03/1998   breast biopsy, benign   EYE SURGERY Right    PORT-A-CATH REMOVAL Right 04/30/2021   Procedure: REMOVAL PORT-A-CATH;  Surgeon: CErroll Luna MD;  [place] [facility]  Service: General;  Laterality: Right;   PORTACATH PLACEMENT Right 01/22/2020   Procedure: INSERTION PORT-A-CATH WITH ULTRASOUND;  Surgeon: CErroll Luna MD;  [place] [facility]  Service: General;  Laterality: Right;   PORTACATH PLACEMENT Right 02/28/2020   Procedure: PORT A CATH REVISION;  Surgeon: CErroll Luna MD;  [place] [facility]  Service: General;  Laterality: Right;   REVERSE SHOULDER ARTHROPLASTY Left 02/02/2021   Procedure: LEFT REVERSE SHOULDER ARTHROPLASTY;  Surgeon: DMeredith Pel MD;  [facility]  Service: Orthopedics;  Laterality: Left;   SHOULDER SURGERY Right    SPINAL FUSION  03/04/2011   with ORIF   Social History:  reports that she quit smoking about 43 years ago. Her smoking use included cigarettes. She has never used smokeless tobacco. She reports current alcohol use of about 5.0 standard drinks of alcohol per week. She reports that she does not use drugs.  Allergies  Allergen Reactions   Zolpidem Tartrate Other (See Comments)    Hallucinations and Confusion Other reaction(s): Hallucinations confusion     Family History  Problem Relation Age of Onset    Thyroid disease Mother    Dementia Mother    Depression Mother    Anxiety disorder Mother    Diabetes Father    Stroke Father    Alcoholism Father    Hypertension Sister    Diabetes Sister    Colon cancer Neg Hx    Colon polyps Neg Hx    Stomach cancer Neg Hx    Esophageal cancer Neg Hx     Prior to Admission medications   Medication Sig Start Date End Date Taking? Authorizing Provider  acetaminophen (TYLENOL) 500 MG tablet Take 1,000 mg by mouth every 6 (six) hours as needed for mild pain.    [provider]  amitriptyline (ELAVIL) 25 MG tablet Take 25-75 mg by mouth at bedtime as needed for sleep.    [provider]  anastrozole (ARIMIDEX) 1 MG tablet TAKE ONE TABLET BY MOUTH DAILY. 04/20/22   Nicholas Lose, MD  Calcium-Magnesium-Zinc 500-250-12.5 MG TABS Take 1 tablet by mouth daily.    [provider]  carvedilol (COREG) 12.5 MG tablet Take 1 tablet (12.5 mg total) by mouth 2 (two) times daily. 06/15/22   [facility], Tami Lin, PA  diazepam (VALIUM) 10 MG tablet Take 1 tablet (10 mg total) by mouth at bedtime as needed for anxiety. 10/04/22   Nicholas Lose, MD  dicyclomine (BENTYL) 10 MG capsule Take 10 mg by mouth 4 (four) times daily -  before meals and at bedtime.    [provider]  DULoxetine (CYMBALTA) 60 MG capsule Take 60 mg by mouth 2 (two) times daily. 06/18/22   [provider]  ELIQUIS 5 MG TABS tablet TAKE ONE TABLET BY MOUTH TWICE DAILY. 06/08/22   O'NealCassie Freer, MD  furosemide (LASIX) 20 MG tablet Take 20-40 mg by mouth daily as needed for fluid or edema.    [provider]  levothyroxine (SYNTHROID) 175 MCG tablet Take 1 tablet (175 mcg total) by mouth daily. 09/21/22   Laqueta Linden, MD  PEG-KCl-NaCl-NaSulf-Na Asc-C (PLENVU) 140 g SOLR Following the doctor's instructions, begin the evening before the procedure at 6pm 10/12/22   Sharyn Creamer, MD  potassium chloride (KLOR-CON) 10 MEQ tablet Take 10 mEq by  mouth daily. May take 1 additional tablet ( 10 MeQ) if taking lasix. 03/11/22   [provider]  tiZANidine (ZANAFLEX) 2 MG tablet Take 2 mg by mouth every 6 (six) hours as needed for muscle spasms.    [provider]  Vitamin D, Ergocalciferol, (DRISDOL) 1.25 MG (50000 UNIT) CAPS capsule Take 1 capsule (50,000 Units total) by mouth every 7 (seven) days. 09/21/22   Laqueta Linden, MD    Physical Exam: Vitals:   11/17/22 2013 11/17/22 2211 11/17/22 2215 11/17/22 2355  BP:   129/85   Pulse:  77 76   Resp:  20 (!) 24   Temp: 98.8 F (37.1 C)   98.4 F (36.9 C)  TempSrc: Oral   Oral  SpO2:  98%  97%   Weight:      Height:       Constitutional: NAD, calm, comfortable, morbidly obese female laying at approximately 40 degree incline in bed Eyes: lids and conjunctivae normal ENMT: Mucous membranes are moist.  Neck: normal, supple Respiratory: clear to auscultation bilaterally, no wheezing, no crackles. Normal respiratory effort. No accessory muscle use.  Cardiovascular: Regular rate and rhythm, no murmurs / rubs / gallops.  Nonpitting edema bilateral lower extremity up to knee.   Abdomen: no tenderness,  Bowel sounds positive.  Musculoskeletal: no clubbing / cyanosis. No joint deformity upper and lower extremities.  Normal muscle tone.  Skin: no rashes, lesions, ulcers. No induration Neurologic: CN 2-12 grossly intact. Strength 5/5 in all 4.  Psychiatric: Normal judgment and insight. Alert and oriented x 3. Normal mood. Data Reviewed:  See HPI  Assessment and Plan: * Acute respiratory failure with hypoxia ([facility]) Suspect secondary to new onset CHF exacerbation from uncontrolled HTN due to medication nonadherence -Chest x-ray shows likely pulmonary congestion.  CTA chest was negative for pulmonary embolism. -continue daily IV '40mg'$  Lasix  -strict intake and output, daily weights -repeat echo. Last one on 04/2022 with EF of 55-60% with LVH. No significant valvular  abnormality.   Morbid obesity (HCC) BMI of 40  Hypokalemia K of 2.8 on presentation.  -replete with IV K and oral K in ED. Follow replete in the morning.  Malignant neoplasm of upper-inner quadrant of right breast in female, estrogen receptor positive ([place]) -Hx of right breast cancer s/p lumpectomy, adjuvant chemotherapy, radiation, herceptin maintenance and currently on antiestrogen therapy -Last mammogram in July without malignancy -follows with Dr. Lindi Adie oncology  Benign essential HTN Elevated due to medication nonadherence -continue home antihypertensives pending med rec -PRN hydralazine IV for BP >170/110  Alcohol abuse -reports several glasses of wine nightly with last drink over Thanksgiving holiday. RN also reports pt being agitated and walking out of her room -no other signs of withdrawal at this time but will keep on CIWA q4hr  Chronic back pain -Give dose of Tramadol   Hypothyroidism Continue levothyroxine pending med rec      Advance Care Planning:   Code Status: Full Code Full  Consults: none  Family Communication: none at bedside  Severity of Illness: The appropriate patient status for this patient is INPATIENT. Inpatient status is judged to be reasonable and necessary in order to provide the required intensity of service to ensure the patient's safety. The patient's presenting symptoms, physical exam findings, and initial radiographic and laboratory data in the context of their chronic comorbidities is felt to place them at high risk for further clinical deterioration. Furthermore, it is not anticipated that the patient will be medically stable for discharge from the hospital within 2 midnights of admission.   * I certify that at the point of admission it is my clinical judgment that the patient will require inpatient hospital care spanning beyond 2 midnights from the point of admission due to high intensity of service, high risk for further deterioration and  high frequency of surveillance required.*  Author: Orene Desanctis, DO 11/18/2022 12:15 AM  For on call review [email or web address].

## 2022-11-17 NOTE — Assessment & Plan Note (Addendum)
Elevated due to medication nonadherence -continue home antihypertensives pending med rec -PRN hydralazine IV for BP >170/110

## 2022-11-17 NOTE — ED Notes (Signed)
Patient ambulated SPO2 sats. 89% RA.  Patient is sitting on the side of the bed SPO2 is 94% RA.

## 2022-11-17 NOTE — ED Provider Triage Note (Signed)
Emergency Medicine Provider Triage Evaluation Note  [name] , a 71 y.o. female  was evaluated in triage.  Pt complains of shortness of breath, cough, nasal congestion.  Patient states that symptoms been present for the past month but worsening.  Was seen by PCP and sent to emergency room for further evaluation.  Was found to be on 77% oxygen room air was placed on 2 L.  Patient wears no oxygen at baseline.  Reports nonproductive cough as well as nasal chronic nasal congestion over the past several days.  Denies any fever, chills, chest pain, abdominal pain or nausea, vomiting.  She is reporting some left lower back pain which she states is chronic in nature and has been present over the past year since prior lower back surgery.  Review of Systems  Positive: See above Negative:   Physical Exam  BP (!) 158/82   Pulse 86   Temp 98.2 F (36.8 C) (Oral)   Resp 16   Ht '5\' 6"'$  (1.676 m)   Wt 113.4 kg   LMP  (LMP Unknown)   SpO2 99%   BMI 40.35 kg/m  Gen:   Awake, no distress   Resp:  Normal effort  MSK:   Moves extremities without difficulty  Other:    Medical Decision Making  Medically screening exam initiated at 3:16 PM.  Appropriate orders placed.  [name] was informed that the remainder of the evaluation will be completed by another provider, this initial triage assessment does not replace that evaluation, and the importance of remaining in the ED until their evaluation is complete.     Wilnette Kales, [place] 11/17/22 1517

## 2022-11-17 NOTE — Assessment & Plan Note (Signed)
BMI of 40

## 2022-11-17 NOTE — Assessment & Plan Note (Signed)
Suspect secondary to new onset CHF exacerbation from uncontrolled HTN due to medication nonadherence -continue daily IV '40mg'$  Lasix  -strict intake and output, daily weights -repeat echo. Last one.Marland Kitchen

## 2022-11-17 NOTE — ED Triage Notes (Signed)
Per [facility] pt coming from [facility] office- pt was at routine appt and c/o shortness of breath with exertion. Was found to be 77% RA. Place on 2L [place] with EMS. EMS report MD informed them of pts hx of alcohol and prescription pain use. Pt 83% on RA on arrival and placed back on 2L [place]. Patient A&0x4.

## 2022-11-17 NOTE — Assessment & Plan Note (Signed)
Continue levothyroxine pending med rec 

## 2022-11-18 ENCOUNTER — Inpatient Hospital Stay (HOSPITAL_COMMUNITY): Payer: Medicare PPO

## 2022-11-18 DIAGNOSIS — J9601 Acute respiratory failure with hypoxia: Secondary | ICD-10-CM | POA: Diagnosis not present

## 2022-11-18 DIAGNOSIS — I5033 Acute on chronic diastolic (congestive) heart failure: Secondary | ICD-10-CM | POA: Diagnosis not present

## 2022-11-18 DIAGNOSIS — J9602 Acute respiratory failure with hypercapnia: Secondary | ICD-10-CM | POA: Diagnosis not present

## 2022-11-18 DIAGNOSIS — J811 Chronic pulmonary edema: Secondary | ICD-10-CM | POA: Diagnosis present

## 2022-11-18 LAB — BASIC METABOLIC PANEL
Anion gap: 7 (ref 5–15)
BUN: 23 mg/dL (ref 8–23)
CO2: 39 mmol/L — ABNORMAL HIGH (ref 22–32)
Calcium: 8.1 mg/dL — ABNORMAL LOW (ref 8.9–10.3)
Chloride: 99 mmol/L (ref 98–111)
Creatinine, Ser: 0.76 mg/dL (ref 0.44–1.00)
GFR, Estimated: >60 mL/min (ref >60)
Glucose, Bld: 141 mg/dL — ABNORMAL HIGH (ref 70–99)
Potassium: 3.3 mmol/L — ABNORMAL LOW (ref 3.5–5.1)
Sodium: 145 mmol/L (ref 135–145)

## 2022-11-18 LAB — BLOOD GAS, ARTERIAL
Acid-Base Excess: 13.8 mmol/L — ABNORMAL HIGH (ref 0.0–2.0)
Acid-Base Excess: 15 mmol/L — ABNORMAL HIGH (ref 0.0–2.0)
Bicarbonate: 44.3 mmol/L — ABNORMAL HIGH (ref 20.0–28.0)
Bicarbonate: 43.6 mmol/L — ABNORMAL HIGH (ref 20.0–28.0)
Drawn by: 33147
O2 Content: 2 L/min
O2 Saturation: 95.7 %
O2 Saturation: 96 %
Patient temperature: 37
Patient temperature: 37
pCO2 arterial: 88 mmHg (ref 32–48)
pCO2 arterial: 72 mmHg (ref 32–48)
pH, Arterial: 7.31 — ABNORMAL LOW (ref 7.35–7.45)
pH, Arterial: 7.39 (ref 7.35–7.45)
pO2, Arterial: 70 mmHg — ABNORMAL LOW (ref 83–108)
pO2, Arterial: 65 mmHg — ABNORMAL LOW (ref 83–108)

## 2022-11-18 LAB — BLOOD GAS, VENOUS
Acid-Base Excess: 13.8 mmol/L — ABNORMAL HIGH (ref 0.0–2.0)
Bicarbonate: 43.8 mmol/L — ABNORMAL HIGH (ref 20.0–28.0)
O2 Saturation: 92 %
Patient temperature: 37
pCO2, Ven: 87 mmHg (ref 44–60)
pH, Ven: 7.31 (ref 7.25–7.43)
pO2, Ven: 64 mmHg — ABNORMAL HIGH (ref 32–45)

## 2022-11-18 LAB — ECHOCARDIOGRAM COMPLETE
Area-P 1/2: 3.12 cm2
Calc EF: 71.6 %
Height: 66 in
S' Lateral: 2.8 cm
Single Plane A2C EF: 78.4 %
Single Plane A4C EF: 65.4 %
Weight: 4239.89 oz

## 2022-11-18 MED ORDER — CARVEDILOL 12.5 MG PO TABS
12.5000 mg | ORAL_TABLET | Freq: Two times a day (BID) | ORAL | Status: DC
  Administered 2022-11-19 – 2022-11-27 (×17): 12.5 mg via ORAL
  Filled 2022-11-18 (×17): qty 1

## 2022-11-18 MED ORDER — LOSARTAN POTASSIUM 50 MG PO TABS
50.0000 mg | ORAL_TABLET | Freq: Every day | ORAL | Status: DC
  Administered 2022-11-19 – 2022-11-27 (×9): 50 mg via ORAL
  Filled 2022-11-18 (×9): qty 1

## 2022-11-18 MED ORDER — LEVOTHYROXINE SODIUM 50 MCG PO TABS
175.0000 ug | ORAL_TABLET | Freq: Every day | ORAL | Status: DC
  Administered 2022-11-20 – 2022-11-27 (×7): 175 ug via ORAL
  Filled 2022-11-18 (×7): qty 1

## 2022-11-18 MED ORDER — ORAL CARE MOUTH RINSE: 15.0000 mL | OROMUCOSAL | Status: DC | PRN

## 2022-11-18 MED ORDER — DIAZEPAM 5 MG PO TABS
10.0000 mg | ORAL_TABLET | Freq: Every evening | ORAL | Status: DC | PRN
  Administered 2022-11-19 – 2022-11-26 (×6): 10 mg via ORAL
  Filled 2022-11-18 (×6): qty 2

## 2022-11-18 MED ORDER — CHLORHEXIDINE GLUCONATE CLOTH 2 % EX PADS
6.0000 | MEDICATED_PAD | Freq: Every day | CUTANEOUS | Status: DC
  Administered 2022-11-18 – 2022-11-19 (×2): 6 via TOPICAL

## 2022-11-18 MED ORDER — FUROSEMIDE 10 MG/ML IJ SOLN
40.0000 mg | Freq: Every day | INTRAMUSCULAR | Status: DC
  Administered 2022-11-18 – 2022-11-19 (×2): 40 mg via INTRAVENOUS
  Filled 2022-11-18 (×2): qty 4

## 2022-11-18 MED ORDER — POTASSIUM CHLORIDE CRYS ER 20 MEQ PO TBCR
40.0000 meq | EXTENDED_RELEASE_TABLET | Freq: Once | ORAL | Status: AC
  Administered 2022-11-18: 40 meq via ORAL
  Filled 2022-11-18: qty 2

## 2022-11-18 NOTE — Assessment & Plan Note (Signed)
-  Give dose of Tramadol

## 2022-11-18 NOTE — Progress Notes (Signed)
Patient anxious and complaining she can't void and feels full. Bladder scan=228m, ask A. CZebedee IbaNP face-face order to in/out patient now for comfort. In/out 3076m

## 2022-11-18 NOTE — Progress Notes (Signed)
  Echocardiogram 2D Echocardiogram has been performed.  [name] 11/18/2022, 2:33 PM

## 2022-11-18 NOTE — Progress Notes (Signed)
2D echo attempted, but patient going to ICU per RN. Patient on bipap and other staff in room. Will try echo later

## 2022-11-18 NOTE — Progress Notes (Signed)
RT placed pt on BIPAP due to ABG.

## 2022-11-18 NOTE — Assessment & Plan Note (Signed)
-  reports several glasses of wine nightly with last drink over Thanksgiving holiday. RN also reports pt being agitated and walking out of her room -no other signs of withdrawal at this time but will keep on CIWA q4hr

## 2022-11-18 NOTE — Progress Notes (Signed)
PROGRESS NOTE    [name]  ACZ:[id number] DOB: November 03, 1951 DOA: 11/17/2022 PCP: Bridget Hartshorn, NP     Brief Narrative:  [name] is a 71 y.o. female with medical history significant of HTN, pre-diabetes, hypothyroidism, NAFLD, Hx of right breast cancer s/p lumpectomy, adjuvant chemotherapy, radiation, herceptin maintenance and currently on antiestrogen therapy, persistent atrial fibrillation on Eliquis who presents with hypoxia.  Patient presented with chief complaints of wheezing, productive cough, difficulty sleeping, increasing lower extremity edema.  In the emergency department, she was requiring 3 L nasal cannula O2.  Chest x-ray with pulmonary congestion.  Patient was started on IV Lasix and admitted to the hospital.  New events last 24 hours / Subjective: Patient seen in the emergency department.  She is easily arousable but falls asleep quickly.  Does not stay awake long enough to answer questions appropriately.  Has significant bilateral  peripheral edema.  Urine output not documented.  Assessment & Plan:   Principal Problem:   Acute respiratory failure with hypoxia and hypercapnia (HCC) Active Problems:   Hypothyroidism   Chronic back pain   Alcohol abuse   Benign essential HTN   Malignant neoplasm of upper-inner quadrant of right breast in female, estrogen receptor positive (HCC)   Hypokalemia   Morbid obesity (HCC)   Acute on chronic diastolic (congestive) heart failure (HCC)   Pulmonary edema   Acute hypoxemic and hypercarbic respiratory failure -SpO2 documented as low as 82% on 2L O2  -Due to her somnolence, ABG was obtained which revealed hypercarbia with pCO2 88, pO2 70  -COVID, influenza negative -CTA chest negative for PE -Start BiPAP and transferred to stepdown -Repeat ABG this afternoon  Acute on chronic diastolic heart failure -Continue IV Lasix -Strict I's and O's, daily weight -Repeat echocardiogram is pending  A-fib -Coreg,  Eliquis  Demand ischemia -Secondary to above, respiratory failure, heart failure -Troponin trend 45, 48  Hypokalemia -Replace, trend  Hypertension -Coreg, Cozaar  Hypothyroidism -Synthroid  Alcohol abuse -CIWA protocol  Malignant neoplasm right breast -Status postlumpectomy, chemo and radiation -Followed by Dr. Lindi Adie -Anastrozole     DVT prophylaxis:  apixaban (ELIQUIS) tablet 5 mg  Code Status:  Family Communication: Husband over the phone  Disposition Plan:  Status is: Inpatient Remains inpatient appropriate because: BiPAP use    Antimicrobials:  Anti-infectives (From admission, onward)    None        Objective: Vitals:   11/18/22 0900 11/18/22 1000 11/18/22 1100 11/18/22 1256  BP: (!) 156/97 (!) 154/111 (!) 149/64 (!) 128/54  Pulse: 73 75 77 80  Resp: '16 16 16 '$ (!) 22  Temp:   97.6 F (36.4 C) (!) 97.3 F (36.3 C)  TempSrc:    Axillary  SpO2: 100% 97% 97% 99%  Weight:    120.2 kg  Height:    '5\' 6"'$  (1.676 m)    Intake/Output Summary (Last 24 hours) at 11/18/2022 1351 Last data filed at 11/17/2022 1820 Gross per 24 hour  Intake 61.21 ml  Output --  Net 61.21 ml   Filed Weights   11/17/22 1507 11/18/22 1256  Weight: 113.4 kg 120.2 kg    Examination:  General exam: Appears lethargic Respiratory system: Clear to auscultation anteriorly Cardiovascular system: S1 & S2 heard. No murmurs.  Bilateral pedal edema. Gastrointestinal system: Abdomen is nondistended, soft and nontender. Normal bowel sounds heard. Central nervous system: Alert to voice Extremities: Symmetric in appearance   Data Reviewed: I have personally reviewed following labs and imaging studies  CBC: Recent Labs  Lab 11/17/22 1516  WBC 6.8  NEUTROABS 4.6  HGB 14.3  HCT 45.4  MCV 105.8*  PLT 638   Basic Metabolic Panel: Recent Labs  Lab 11/17/22 1516 11/18/22 0446  NA 145 145  K 2.8* 3.3*  CL 100 99  CO2 36* 39*  GLUCOSE 107* 141*  BUN 27* 23  CREATININE  0.97 0.76  CALCIUM 8.8* 8.1*   GFR: Estimated Creatinine Clearance: 82.8 mL/min (by C-G formula based on SCr of 0.76 mg/dL). Liver Function Tests: Recent Labs  Lab 11/17/22 1516  AST 18  ALT 14  ALKPHOS 86  BILITOT 0.6  PROT 6.7  ALBUMIN 3.5   No results for input(s): "LIPASE", "AMYLASE" in the last 168 hours. No results for input(s): "AMMONIA" in the last 168 hours. Coagulation Profile: No results for input(s): "INR", "PROTIME" in the last 168 hours. Cardiac Enzymes: No results for input(s): "CKTOTAL", "CKMB", "CKMBINDEX", "TROPONINI" in the last 168 hours. BNP (last 3 results) No results for input(s): "PROBNP" in the last 8760 hours. HbA1C: No results for input(s): "HGBA1C" in the last 72 hours. CBG: No results for input(s): "GLUCAP" in the last 168 hours. Lipid Profile: No results for input(s): "CHOL", "HDL", "LDLCALC", "TRIG", "CHOLHDL", "LDLDIRECT" in the last 72 hours. Thyroid Function Tests: No results for input(s): "TSH", "T4TOTAL", "FREET4", "T3FREE", "THYROIDAB" in the last 72 hours. Anemia Panel: No results for input(s): "VITAMINB12", "FOLATE", "FERRITIN", "TIBC", "IRON", "RETICCTPCT" in the last 72 hours. Sepsis Labs: No results for input(s): "PROCALCITON", "LATICACIDVEN" in the last 168 hours.  Recent Results (from the past 240 hour(s))  Resp Panel by RT-PCR (Flu A&B, Covid) Anterior Nasal Swab     Status: None   Collection Time: 11/17/22  3:16 PM   Specimen: Anterior Nasal Swab  Result Value Ref Range Status   SARS Coronavirus 2 by RT PCR NEGATIVE NEGATIVE Final    Comment: (NOTE) SARS-CoV-2 target nucleic acids are NOT DETECTED.  The SARS-CoV-2 RNA is generally detectable in upper respiratory specimens during the acute phase of infection. The lowest concentration of SARS-CoV-2 viral copies this assay can detect is 138 copies/mL. A negative result does not preclude SARS-Cov-2 infection and should not be used as the sole basis for treatment or other  patient management decisions. A negative result may occur with  improper specimen collection/handling, submission of specimen other than nasopharyngeal swab, presence of viral mutation(s) within the areas targeted by this assay, and inadequate number of viral copies(<138 copies/mL). A negative result must be combined with clinical observations, patient history, and epidemiological information. The expected result is Negative.  Fact Sheet for Patients:  EntrepreneurPulse.com.au  Fact Sheet for Healthcare Providers:  IncredibleEmployment.be  This test is no t yet approved or cleared by the [place] FDA and  has been authorized for detection and/or diagnosis of SARS-CoV-2 by FDA under an Emergency Use Authorization (EUA). This EUA will remain  in effect (meaning this test can be used) for the duration of the COVID-19 declaration under Section 564(b)(1) of the Act, 21 U.S.C.section 360bbb-3(b)(1), unless the authorization is terminated  or revoked sooner.       Influenza A by PCR NEGATIVE NEGATIVE Final   Influenza B by PCR NEGATIVE NEGATIVE Final    Comment: (NOTE) The Xpert Xpress SARS-CoV-2/FLU/RSV plus assay is intended as an aid in the diagnosis of influenza from Nasopharyngeal swab specimens and should not be used as a sole basis for treatment. Nasal washings and aspirates are unacceptable for Xpert Xpress SARS-CoV-2/FLU/RSV testing.  Fact Sheet for Patients: EntrepreneurPulse.com.au  Fact Sheet for Healthcare Providers: IncredibleEmployment.be  This test is not yet approved or cleared by the [place] FDA and has been authorized for detection and/or diagnosis of SARS-CoV-2 by FDA under an Emergency Use Authorization (EUA). This EUA will remain in effect (meaning this test can be used) for the duration of the COVID-19 declaration under Section 564(b)(1) of the Act, 21 U.S.C. section  360bbb-3(b)(1), unless the authorization is terminated or revoked.  Performed at [facility], [place] [address], [place], [place] [address]       Radiology Studies: CT Angio Chest PE W and/or Wo Contrast  Result Date: 11/17/2022 CLINICAL DATA:  Shortness of breath on exertion with hypoxia, initial encounter EXAM: CT ANGIOGRAPHY CHEST WITH CONTRAST TECHNIQUE: Multidetector CT imaging of the chest was performed using the standard protocol during bolus administration of intravenous contrast. Multiplanar CT image reconstructions and MIPs were obtained to evaluate the vascular anatomy. RADIATION DOSE REDUCTION: This exam was performed according to the departmental dose-optimization program which includes automated exposure control, adjustment of the mA and/or kV according to patient size and/or use of iterative reconstruction technique. CONTRAST:  139m OMNIPAQUE IOHEXOL 350 MG/ML SOLN COMPARISON:  Chest x-ray from earlier in the same day. FINDINGS: Cardiovascular: Thoracic aorta shows atherosclerotic calcifications. No aneurysmal dilatation is noted. No cardiac enlargement is seen. Coronary calcifications are seen. The pulmonary artery shows a normal branching pattern. No filling pulmonary embolism is noted. Mediastinum/Nodes: Thoracic inlet is within normal limits. No hilar or mediastinal adenopathy is noted. The esophagus is within normal limits. Lungs/Pleura: Lungs are well aerated bilaterally. No focal infiltrate or sizable effusion is seen. Mosaic attenuation is noted consistent with air trapping. No pneumothorax is seen. Upper Abdomen: Visualized upper abdomen demonstrates evidence of cholelithiasis without complicating factors. No other acute abnormality is seen. Musculoskeletal: No acute bony abnormality is noted. Review of the MIP images confirms the above findings. IMPRESSION: No evidence of pulmonary emboli. Cholelithiasis without complicating factors. Electronically Signed   By:  MInez CatalinaM.D.   On: 11/17/2022 21:51   DG Chest 2 View  Result Date: 11/17/2022 CLINICAL DATA:  Sob EXAM: PORTABLE CHEST 1 VIEW COMPARISON:  04/08/2022 FINDINGS: Cardiac silhouette is prominent. There is pulmonary interstitial prominence with vascular congestion. No focal consolidation. No pneumothorax or pleural effusion identified. Osseous structures are osteopenic. There is a left shoulder prosthesis. IMPRESSION: Findings suggest CHF. Electronically Signed   By: JSammie BenchM.D.   On: 11/17/2022 16:24      Scheduled Meds:  anastrozole  1 mg Oral Daily   apixaban  5 mg Oral BID   carvedilol  12.5 mg Oral BID   Chlorhexidine Gluconate Cloth  6 each Topical Daily   folic acid  1 mg Oral Daily   furosemide  40 mg Intravenous Daily   levothyroxine  175 mcg Oral Daily   losartan  50 mg Oral Daily   multivitamin with minerals  1 tablet Oral Daily   sodium chloride flush  3 mL Intravenous Q12H   thiamine  100 mg Oral Daily   Or   thiamine  100 mg Intravenous Daily   Continuous Infusions:  sodium chloride       LOS: 1 day     JDessa Phi DO [facility] 11/18/2022, 1:51 PM   Available via Epic secure chat 7am-7pm After these hours, please refer to coverage provider listed on [email or web address]

## 2022-11-18 NOTE — Progress Notes (Signed)
  PROGRESS NOTE  Checked in on patient this afternoon.  She remains comfortable on BiPAP.  She is easily arousable, but does not stay awake very long.  Seems to be slightly improved since I saw her earlier this morning in the ED.  ABG shows slight improvement in pCO2. Will need to increase FiO2 due to low pO2.    Dessa Phi, DO [facility] 11/18/2022, 4:10 PM  Available via Epic secure chat 7am-7pm After these hours, please refer to coverage provider listed on [email or web address]

## 2022-11-19 DIAGNOSIS — J9602 Acute respiratory failure with hypercapnia: Secondary | ICD-10-CM | POA: Diagnosis not present

## 2022-11-19 DIAGNOSIS — J9601 Acute respiratory failure with hypoxia: Secondary | ICD-10-CM | POA: Diagnosis not present

## 2022-11-19 DIAGNOSIS — F418 Other specified anxiety disorders: Secondary | ICD-10-CM | POA: Diagnosis not present

## 2022-11-19 LAB — BLOOD GAS, ARTERIAL
Acid-Base Excess: 16.1 mmol/L — ABNORMAL HIGH (ref 0.0–2.0)
Acid-Base Excess: 18.7 mmol/L — ABNORMAL HIGH (ref 0.0–2.0)
Bicarbonate: 44.4 mmol/L — ABNORMAL HIGH (ref 20.0–28.0)
Bicarbonate: 46.6 mmol/L — ABNORMAL HIGH (ref 20.0–28.0)
Drawn by: 56037
Drawn by: 275531
Expiratory PAP: 10 cmH2O
FIO2: 30 %
Inspiratory PAP: 22 cmH2O
O2 Saturation: 80.1 %
O2 Saturation: 100 %
Patient temperature: 37.1
Patient temperature: 36
RATE: 16 resp/min
pCO2 arterial: 70 mmHg (ref 32–48)
pCO2 arterial: 64 mmHg — ABNORMAL HIGH (ref 32–48)
pH, Arterial: 7.41 (ref 7.35–7.45)
pH, Arterial: 7.47 — ABNORMAL HIGH (ref 7.35–7.45)
pO2, Arterial: 44 mmHg — ABNORMAL LOW (ref 83–108)
pO2, Arterial: 127 mmHg — ABNORMAL HIGH (ref 83–108)

## 2022-11-19 LAB — BASIC METABOLIC PANEL
Anion gap: 7 (ref 5–15)
BUN: 22 mg/dL (ref 8–23)
CO2: 37 mmol/L — ABNORMAL HIGH (ref 22–32)
Calcium: 8.2 mg/dL — ABNORMAL LOW (ref 8.9–10.3)
Chloride: 100 mmol/L (ref 98–111)
Creatinine, Ser: 0.59 mg/dL (ref 0.44–1.00)
GFR, Estimated: >60 mL/min (ref >60)
Glucose, Bld: 124 mg/dL — ABNORMAL HIGH (ref 70–99)
Potassium: 3 mmol/L — ABNORMAL LOW (ref 3.5–5.1)
Sodium: 144 mmol/L (ref 135–145)

## 2022-11-19 LAB — MRSA NEXT GEN BY PCR, NASAL: MRSA by PCR Next Gen: NOT DETECTED

## 2022-11-19 LAB — VITAMIN B12: Vitamin B-12: 700 pg/mL (ref 180–914)

## 2022-11-19 LAB — FOLATE: Folate: 14.1 ng/mL (ref >5.9)

## 2022-11-19 MED ORDER — FUROSEMIDE 10 MG/ML IJ SOLN
80.0000 mg | Freq: Two times a day (BID) | INTRAMUSCULAR | Status: DC
  Administered 2022-11-19 – 2022-11-21 (×5): 80 mg via INTRAVENOUS
  Filled 2022-11-19 (×5): qty 8

## 2022-11-19 MED ORDER — POTASSIUM CHLORIDE CRYS ER 20 MEQ PO TBCR: 20.0000 meq | EXTENDED_RELEASE_TABLET | Freq: Once | ORAL | Status: DC

## 2022-11-19 MED ORDER — LORAZEPAM 2 MG/ML IJ SOLN
1.0000 mg | INTRAMUSCULAR | Status: AC | PRN
  Administered 2022-11-19 – 2022-11-21 (×5): 2 mg via INTRAVENOUS
  Administered 2022-11-22: 4 mg via INTRAVENOUS
  Administered 2022-11-22 (×2): 3 mg via INTRAVENOUS
  Administered 2022-11-22: 2 mg via INTRAVENOUS
  Filled 2022-11-19 (×3): qty 1
  Filled 2022-11-19 (×2): qty 2
  Filled 2022-11-19: qty 1
  Filled 2022-11-19: qty 2
  Filled 2022-11-19: qty 1

## 2022-11-19 MED ORDER — SERTRALINE HCL 50 MG PO TABS
25.0000 mg | ORAL_TABLET | Freq: Every day | ORAL | Status: AC
  Administered 2022-11-19 – 2022-11-20 (×2): 25 mg via ORAL
  Filled 2022-11-19 (×2): qty 1

## 2022-11-19 MED ORDER — POTASSIUM CHLORIDE CRYS ER 20 MEQ PO TBCR
40.0000 meq | EXTENDED_RELEASE_TABLET | Freq: Once | ORAL | Status: AC
  Administered 2022-11-19: 40 meq via ORAL
  Filled 2022-11-19: qty 2

## 2022-11-19 MED ORDER — LORAZEPAM 1 MG PO TABS
1.0000 mg | ORAL_TABLET | ORAL | Status: AC | PRN
  Administered 2022-11-19: 2 mg via ORAL
  Administered 2022-11-20 – 2022-11-21 (×2): 1 mg via ORAL
  Administered 2022-11-21: 2 mg via ORAL
  Administered 2022-11-21: 1 mg via ORAL
  Administered 2022-11-21 (×4): 2 mg via ORAL
  Filled 2022-11-19 (×2): qty 2
  Filled 2022-11-19: qty 1
  Filled 2022-11-19: qty 4
  Filled 2022-11-19: qty 2
  Filled 2022-11-19: qty 1
  Filled 2022-11-19: qty 2
  Filled 2022-11-19: qty 1
  Filled 2022-11-19 (×2): qty 2

## 2022-11-19 MED ORDER — SERTRALINE HCL 50 MG PO TABS
50.0000 mg | ORAL_TABLET | Freq: Every day | ORAL | Status: DC
  Administered 2022-11-21 – 2022-11-27 (×7): 50 mg via ORAL
  Filled 2022-11-19 (×7): qty 1

## 2022-11-19 NOTE — TOC Progression Note (Signed)
Transition of Care [facility]) - Progression Note    Patient Details  Name: [name] MRN: [id number] Date of Birth: 12/14/1951  [facility] CM/SW Contact  Henrietta Dine, RN Phone Number: 11/19/2022, 10:33 AM  Clinical Narrative:     TOC order received for Heart Failure Home Health Screening. Patient has been reviewed and does not meet criteria.         Expected Discharge Plan and Services                                                 Social Determinants of Health (SDOH) Interventions    Readmission Risk Interventions     No data to display

## 2022-11-19 NOTE — Progress Notes (Addendum)
PROGRESS NOTE    [name]  ZWC:[id number] DOB: 1951-12-07 DOA: 11/17/2022 PCP: Bridget Hartshorn, NP     Brief Narrative:  [name] is a 71 y.o. female with medical history significant of HTN, pre-diabetes, hypothyroidism, NAFLD, Hx of right breast cancer s/p lumpectomy, adjuvant chemotherapy, radiation, herceptin maintenance and currently on antiestrogen therapy, persistent atrial fibrillation on Eliquis who presents with hypoxia.  Patient presented with chief complaints of wheezing, productive cough, difficulty sleeping, increasing lower extremity edema.  In the emergency department, she was requiring 3 L nasal cannula O2.  Chest x-ray with pulmonary congestion.  Patient was started on IV Lasix and admitted to the hospital.  Due to elevation in pCO2, patient was placed on BiPAP and admitted to stepdown unit.  New events last 24 hours / Subjective: Patient weaned off BiPAP this morning, on 3 L.  She is much more alert and interactive, oriented to self, hospital, year.  Wants to eat.  Assessment & Plan:   Principal Problem:   Acute respiratory failure with hypoxia and hypercapnia (HCC) Active Problems:   Hypothyroidism   Chronic back pain   Alcohol abuse   Benign essential HTN   Malignant neoplasm of upper-inner quadrant of right breast in female, estrogen receptor positive (HCC)   Hypokalemia   Morbid obesity (HCC)   Acute on chronic diastolic (congestive) heart failure (HCC)   Pulmonary edema   Acute hypoxemic and hypercarbic respiratory failure -SpO2 documented as low as 82% on 2L O2  -Due to her somnolence, ABG was obtained which revealed hypercarbia with pCO2 88, pO2 70  -COVID, influenza negative -CTA chest negative for PE -Now weaned off BiPAP today  Acute on chronic diastolic heart failure -Continue IV Lasix, increase dose today due to inadequate UOP  -Strict I's and O's, daily weight -Echocardiogram revealed EF 60 to 65%, no regional wall motion  abnormality, mild LVH  A-fib -Coreg, Eliquis  Demand ischemia -Secondary to above, respiratory failure, heart failure -Troponin trend 45, 48  Hypokalemia -Replace, trend  Hypertension -Coreg, Cozaar  Hypothyroidism -Synthroid  Alcohol abuse -CIWA protocol  Malignant neoplasm right breast -Status postlumpectomy, chemo and radiation -Followed by Dr. Lindi Adie -Anastrozole     DVT prophylaxis:  apixaban (ELIQUIS) tablet 5 mg  Code Status: Full Family Communication: None at bedside Disposition Plan:  Status is: Inpatient Remains inpatient appropriate because: IV Lasix   Antimicrobials:  Anti-infectives (From admission, onward)    None        Objective: Vitals:   11/19/22 0620 11/19/22 0745 11/19/22 0800 11/19/22 0807  BP:   (!) 171/75   Pulse:  76 78 79  Resp:   18 20  Temp: 98.2 F (36.8 C)  98.4 F (36.9 C)   TempSrc:   Oral   SpO2:   100% 100%  Weight: 122.3 kg     Height:        Intake/Output Summary (Last 24 hours) at 11/19/2022 1040 Last data filed at 11/19/2022 0640 Gross per 24 hour  Intake 0 ml  Output 300 ml  Net -300 ml    Filed Weights   11/17/22 1507 11/18/22 1256 11/19/22 0620  Weight: 113.4 kg 120.2 kg 122.3 kg   Examination: General exam: Appears calm and comfortable  Respiratory system: Clear to auscultation anteriorly, respiratory effort is normal, on 3 L oxygen Cardiovascular system: S1 & S2 heard. + Bilateral pedal edema. Gastrointestinal system: Abdomen is nondistended, soft and nontender. Normal bowel sounds heard. Central nervous system: Alert and  oriented. Non focal exam.  Skin:  Psychiatry: Judgement and insight appear stable. Mood & affect appropriate.    Data Reviewed: I have personally reviewed following labs and imaging studies  CBC: Recent Labs  Lab 11/17/22 1516  WBC 6.8  NEUTROABS 4.6  HGB 14.3  HCT 45.4  MCV 105.8*  PLT 240    Basic Metabolic Panel: Recent Labs  Lab 11/17/22 1516 11/18/22 0446  11/19/22 0313  NA 145 145 144  K 2.8* 3.3* 3.0*  CL 100 99 100  CO2 36* 39* 37*  GLUCOSE 107* 141* 124*  BUN 27* 23 22  CREATININE 0.97 0.76 0.59  CALCIUM 8.8* 8.1* 8.2*    GFR: Estimated Creatinine Clearance: 83.5 mL/min (by C-G formula based on SCr of 0.59 mg/dL). Liver Function Tests: Recent Labs  Lab 11/17/22 1516  AST 18  ALT 14  ALKPHOS 86  BILITOT 0.6  PROT 6.7  ALBUMIN 3.5    No results for input(s): "LIPASE", "AMYLASE" in the last 168 hours. No results for input(s): "AMMONIA" in the last 168 hours. Coagulation Profile: No results for input(s): "INR", "PROTIME" in the last 168 hours. Cardiac Enzymes: No results for input(s): "CKTOTAL", "CKMB", "CKMBINDEX", "TROPONINI" in the last 168 hours. BNP (last 3 results) No results for input(s): "PROBNP" in the last 8760 hours. HbA1C: No results for input(s): "HGBA1C" in the last 72 hours. CBG: No results for input(s): "GLUCAP" in the last 168 hours. Lipid Profile: No results for input(s): "CHOL", "HDL", "LDLCALC", "TRIG", "CHOLHDL", "LDLDIRECT" in the last 72 hours. Thyroid Function Tests: No results for input(s): "TSH", "T4TOTAL", "FREET4", "T3FREE", "THYROIDAB" in the last 72 hours. Anemia Panel: No results for input(s): "VITAMINB12", "FOLATE", "FERRITIN", "TIBC", "IRON", "RETICCTPCT" in the last 72 hours. Sepsis Labs: No results for input(s): "PROCALCITON", "LATICACIDVEN" in the last 168 hours.  Recent Results (from the past 240 hour(s))  Resp Panel by RT-PCR (Flu A&B, Covid) Anterior Nasal Swab     Status: None   Collection Time: 11/17/22  3:16 PM   Specimen: Anterior Nasal Swab  Result Value Ref Range Status   SARS Coronavirus 2 by RT PCR NEGATIVE NEGATIVE Final    Comment: (NOTE) SARS-CoV-2 target nucleic acids are NOT DETECTED.  The SARS-CoV-2 RNA is generally detectable in upper respiratory specimens during the acute phase of infection. The lowest concentration of SARS-CoV-2 viral copies this assay can  detect is 138 copies/mL. A negative result does not preclude SARS-Cov-2 infection and should not be used as the sole basis for treatment or other patient management decisions. A negative result may occur with  improper specimen collection/handling, submission of specimen other than nasopharyngeal swab, presence of viral mutation(s) within the areas targeted by this assay, and inadequate number of viral copies(<138 copies/mL). A negative result must be combined with clinical observations, patient history, and epidemiological information. The expected result is Negative.  Fact Sheet for Patients:  EntrepreneurPulse.com.au  Fact Sheet for Healthcare Providers:  IncredibleEmployment.be  This test is no t yet approved or cleared by the [place] FDA and  has been authorized for detection and/or diagnosis of SARS-CoV-2 by FDA under an Emergency Use Authorization (EUA). This EUA will remain  in effect (meaning this test can be used) for the duration of the COVID-19 declaration under Section 564(b)(1) of the Act, 21 U.S.C.section 360bbb-3(b)(1), unless the authorization is terminated  or revoked sooner.       Influenza A by PCR NEGATIVE NEGATIVE Final   Influenza B by PCR NEGATIVE NEGATIVE Final  Comment: (NOTE) The Xpert Xpress SARS-CoV-2/FLU/RSV plus assay is intended as an aid in the diagnosis of influenza from Nasopharyngeal swab specimens and should not be used as a sole basis for treatment. Nasal washings and aspirates are unacceptable for Xpert Xpress SARS-CoV-2/FLU/RSV testing.  Fact Sheet for Patients: EntrepreneurPulse.com.au  Fact Sheet for Healthcare Providers: IncredibleEmployment.be  This test is not yet approved or cleared by the [place] FDA and has been authorized for detection and/or diagnosis of SARS-CoV-2 by FDA under an Emergency Use Authorization (EUA). This EUA will remain in  effect (meaning this test can be used) for the duration of the COVID-19 declaration under Section 564(b)(1) of the Act, 21 U.S.C. section 360bbb-3(b)(1), unless the authorization is terminated or revoked.  Performed at [facility], [place] [address] [place], [place] [address]   MRSA Next Gen by PCR, Nasal     Status: None   Collection Time: 11/19/22  6:42 AM   Specimen: Nasal Mucosa; Nasal Swab  Result Value Ref Range Status   MRSA by PCR Next Gen NOT DETECTED NOT DETECTED Final    Comment: (NOTE) The GeneXpert MRSA Assay (FDA approved for NASAL specimens only), is one component of a comprehensive MRSA colonization surveillance program. It is not intended to diagnose MRSA infection nor to guide or monitor treatment for MRSA infections. Test performance is not FDA approved in patients less than 92 years old. Performed at [facility], [place] [address] [place], [place] [address]       Radiology Studies: ECHOCARDIOGRAM COMPLETE  Result Date: 11/18/2022    ECHOCARDIOGRAM REPORT   Patient Name:   [name] Date of Exam: 11/18/2022 Medical Rec #:  [id number]        Height:       66.0 in Accession #:    [id number]       Weight:       250.0 lb Date of Birth:  21-Feb-1951        BSA:          2.199 m Patient Age:    74 years         BP:           154/103 mmHg Patient Gender: F                HR:           95 bpm. Exam Location:  Inpatient Procedure: 2D Echo, Cardiac Doppler and Color Doppler Indications:    I50.40* Unspecified combined systolic (congestive) and diastolic                 (congestive) heart failure  History:        Patient has prior history of Echocardiogram examinations, most                 recent 05/10/2022. CHF, Abnormal ECG, Signs/Symptoms:Dyspnea,                 Shortness of Breath and Edema; Risk Factors:Hypertension. ETOH.                 Breast cancer.  Sonographer:    Roseanna Rainbow RDCS Referring Phys: [id number] [place] T [facility]  Sonographer  Comments: Technically difficult study due to poor echo windows and patient is obese. Image acquisition challenging due to patient body habitus. IMPRESSIONS  1. Left ventricular ejection fraction, by estimation, is 60 to 65%. The left ventricle has normal function. The left ventricle has no regional wall motion  abnormalities. There is mild left ventricular hypertrophy of the septal segment. Left ventricular diastolic parameters are indeterminate.  2. Right ventricular systolic function is normal. The right ventricular size is normal. There is mildly elevated pulmonary artery systolic pressure.  3. No evidence of mitral valve regurgitation.  4. The aortic valve is tricuspid. Aortic valve regurgitation is not visualized. Comparison(s): No significant change from prior study. FINDINGS  Left Ventricle: Left ventricular ejection fraction, by estimation, is 60 to 65%. The left ventricle has normal function. The left ventricle has no regional wall motion abnormalities. The left ventricular internal cavity size was normal in size. There is  mild left ventricular hypertrophy of the septal segment. Left ventricular diastolic parameters are indeterminate. Right Ventricle: The right ventricular size is normal. Right ventricular systolic function is normal. There is mildly elevated pulmonary artery systolic pressure. The tricuspid regurgitant velocity is 2.58 m/s, and with an assumed right atrial pressure of 15 mmHg, the estimated right ventricular systolic pressure is 80.9 mmHg. Left Atrium: Left atrial size was normal in size. Right Atrium: Right atrial size was normal in size. Pericardium: There is no evidence of pericardial effusion. Mitral Valve: No evidence of mitral valve regurgitation. Tricuspid Valve: Tricuspid valve regurgitation is trivial. Aortic Valve: The aortic valve is tricuspid. Aortic valve regurgitation is not visualized. Pulmonic Valve: Pulmonic valve regurgitation is not visualized. Aorta: The aortic root and  ascending aorta are structurally normal, with no evidence of dilitation. IAS/Shunts: No atrial level shunt detected by color flow Doppler.  LEFT VENTRICLE PLAX 2D LVIDd:         4.40 cm LVIDs:         2.80 cm LV PW:         1.80 cm LV IVS:        1.20 cm LVOT diam:     2.10 cm LV SV:         69 LV SV Index:   32 LVOT Area:     3.46 cm  LV Volumes (MOD) LV vol d, MOD A2C: 61.6 ml LV vol d, MOD A4C: 74.9 ml LV vol s, MOD A2C: 13.3 ml LV vol s, MOD A4C: 25.9 ml LV SV MOD A2C:     48.3 ml LV SV MOD A4C:     74.9 ml LV SV MOD BP:      49.8 ml RIGHT VENTRICLE             IVC RV S prime:     10.60 cm/s  IVC diam: 2.40 cm RVOT diam:      3.00 cm TAPSE (M-mode): 1.3 cm LEFT ATRIUM             Index        RIGHT ATRIUM           Index LA diam:        4.40 cm 2.00 cm/m   RA Area:     18.30 cm LA Vol (A2C):   49.7 ml 22.60 ml/m  RA Volume:   46.90 ml  21.33 ml/m LA Vol (A4C):   43.0 ml 19.56 ml/m LA Biplane Vol: 50.0 ml 22.74 ml/m  AORTIC VALVE LVOT Vmax:   110.50 cm/s LVOT Vmean:  70.400 cm/s LVOT VTI:    0.200 m  AORTA Ao Root diam: 2.80 cm Ao Asc diam:  3.70 cm MITRAL VALVE                TRICUSPID VALVE MV Area (PHT): 3.12 cm  TR Peak grad:   26.6 mmHg MV Decel Time: 243 msec     TR Vmax:        258.00 cm/s MV E velocity: 113.00 cm/s                             SHUNTS                             Systemic VTI:  0.20 m                             Systemic Diam: 2.10 cm                             Pulmonic Diam: 3.00 cm Phineas Inches Electronically signed by Phineas Inches Signature Date/Time: 11/18/2022/2:48:01 PM    Final    CT Angio Chest PE W and/or Wo Contrast  Result Date: 11/17/2022 CLINICAL DATA:  Shortness of breath on exertion with hypoxia, initial encounter EXAM: CT ANGIOGRAPHY CHEST WITH CONTRAST TECHNIQUE: Multidetector CT imaging of the chest was performed using the standard protocol during bolus administration of intravenous contrast. Multiplanar CT image reconstructions and MIPs were obtained to  evaluate the vascular anatomy. RADIATION DOSE REDUCTION: This exam was performed according to the departmental dose-optimization program which includes automated exposure control, adjustment of the mA and/or kV according to patient size and/or use of iterative reconstruction technique. CONTRAST:  154m OMNIPAQUE IOHEXOL 350 MG/ML SOLN COMPARISON:  Chest x-ray from earlier in the same day. FINDINGS: Cardiovascular: Thoracic aorta shows atherosclerotic calcifications. No aneurysmal dilatation is noted. No cardiac enlargement is seen. Coronary calcifications are seen. The pulmonary artery shows a normal branching pattern. No filling pulmonary embolism is noted. Mediastinum/Nodes: Thoracic inlet is within normal limits. No hilar or mediastinal adenopathy is noted. The esophagus is within normal limits. Lungs/Pleura: Lungs are well aerated bilaterally. No focal infiltrate or sizable effusion is seen. Mosaic attenuation is noted consistent with air trapping. No pneumothorax is seen. Upper Abdomen: Visualized upper abdomen demonstrates evidence of cholelithiasis without complicating factors. No other acute abnormality is seen. Musculoskeletal: No acute bony abnormality is noted. Review of the MIP images confirms the above findings. IMPRESSION: No evidence of pulmonary emboli. Cholelithiasis without complicating factors. Electronically Signed   By: MInez CatalinaM.D.   On: 11/17/2022 21:51   DG Chest 2 View  Result Date: 11/17/2022 CLINICAL DATA:  Sob EXAM: PORTABLE CHEST 1 VIEW COMPARISON:  04/08/2022 FINDINGS: Cardiac silhouette is prominent. There is pulmonary interstitial prominence with vascular congestion. No focal consolidation. No pneumothorax or pleural effusion identified. Osseous structures are osteopenic. There is a left shoulder prosthesis. IMPRESSION: Findings suggest CHF. Electronically Signed   By: JSammie BenchM.D.   On: 11/17/2022 16:24      Scheduled Meds:  anastrozole  1 mg Oral Daily    apixaban  5 mg Oral BID   carvedilol  12.5 mg Oral BID   Chlorhexidine Gluconate Cloth  6 each Topical Daily   folic acid  1 mg Oral Daily   furosemide  40 mg Intravenous Daily   levothyroxine  175 mcg Oral QAC breakfast   losartan  50 mg Oral Daily   multivitamin with minerals  1 tablet Oral Daily   potassium chloride  20 mEq Oral Once   potassium chloride  40 mEq Oral Once   sodium chloride flush  3 mL Intravenous Q12H   thiamine  100 mg Oral Daily   Or   thiamine  100 mg Intravenous Daily   Continuous Infusions:  sodium chloride       LOS: 2 days     Dessa Phi, DO [facility] 11/19/2022, 10:40 AM   Available via Epic secure chat 7am-7pm After these hours, please refer to coverage provider listed on [email or web address]

## 2022-11-19 NOTE — TOC Initial Note (Addendum)
Transition of Care [facility]) - Initial/Assessment Note    Patient Details  Name: [name] MRN: [id number] Date of Birth: [birth year]  [facility] CM/SW Contact:    Henrietta Dine, RN Phone Number: 11/19/2022, 10:44 AM  Clinical Narrative:                 [facility] consult for SA counseling/education; spoke w/ pt in room; the pt says she plans to return home after d/c; she also says she has transportation; the pt says she wears glasses and a walker; the pt says she has no HH services; she agrees to receiving SA resource information; pt notified resources be located in d/c instructions; also notified that pt's dtr has concerns about the pt turning home d/t issues with alcohol/narcotic abuse and medication compliance; contacted pt's dtr Almyra Deforest [phone]); she says the pt is "a lot to manage/out of control"; she also requested info about [name]; she also requests psych eval b/c of hx anxiety and depression, and her abuse of medications and alcohol for years; she says the pt has an opioid addicition in addition to alcohol abuse; Ms Linton Rump also the pt can't manage meds; she ialso says the pt is  combative and resists help, Ms Linton Rump also says there are alcohol bottles hidden around hous; POC husband Yamari Ventola [phone]); explained to pt's dtr pt must be medically cleared for [facility];alsp explained PT evaluation of pt also guides d/c planning; awaiting PT eval; TOC will con't to follow. Expected Discharge Plan: Home/Self Care Barriers to Discharge: Continued Medical Work up   Patient Goals and CMS Choice        Expected Discharge Plan and Services Expected Discharge Plan: Home/Self Care   Discharge Planning Services: CM Consult   Living arrangements for the past 2 months: Single Family Home                                      Prior Living Arrangements/Services Living arrangements for the past 2 months: Single Family Home Lives  with:: Spouse Patient language and need for interpreter reviewed:: Yes Do you feel safe going back to the place where you live?: Yes      Need for Family Participation in Patient Care: Yes (Comment) Care giver support system in place?: Yes (comment)   Criminal Activity/Legal Involvement Pertinent to Current Situation/Hospitalization: No - Comment as needed  Activities of Daily Living      Permission Sought/Granted Permission sought to share information with : Case Manager Permission granted to share information with : Yes, Verbal Permission Granted  Share Information with NAME: Lenor Coffin, RN, CM           Emotional Assessment Appearance:: Appears stated age Attitude/Demeanor/Rapport: Gracious Affect (typically observed): Accepting Orientation: : Oriented to Self, Oriented to Place, Oriented to  Time, Oriented to Situation Alcohol / Substance Use: Alcohol Use Psych Involvement: No (comment)  Admission diagnosis:  Shortness of breath [R06.02] Pulmonary edema [J81.1] Acute respiratory failure with hypoxia (HCC) [J96.01] Increased oxygen demand [R68.89] Patient Active Problem List   Diagnosis Date Noted   Pulmonary edema 11/18/2022   Acute respiratory failure with hypoxia and hypercapnia (HCC) 11/17/2022   Acute on chronic diastolic (congestive) heart failure ([place]) 11/17/2022   Arthritis of left shoulder region    S/P reverse total shoulder arthroplasty, 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   Vitamin D deficiency 01/15/2021   Vitamin B12 deficiency  01/15/2021   Primary insomnia 01/15/2021   Morbid obesity ([place]) 01/15/2021   Moderate recurrent major depression ([place]) 01/15/2021   Benzodiazepine dependence ([place]) 01/15/2021   Port-A-Cath in place 07/22/2020   Left leg cellulitis 04/17/2020   Cellulitis of left leg 04/17/2020   Hypokalemia 04/17/2020   Macrocytic anemia 04/17/2020   Anxiety 04/17/2020   Depression 04/17/2020   Chronic diarrhea 04/17/2020   Chemotherapy induced diarrhea  04/17/2020   Malignant neoplasm of upper-inner quadrant of right breast in female, estrogen receptor positive ([place]) 12/26/2019   Encephalopathy 02/02/2018   Hypothyroidism 02/02/2018   AKI (acute kidney injury) ([place]) 02/02/2018   Chronic back pain 02/02/2018   Alcohol abuse 02/02/2018   Weight gain 02/02/2018   Benign essential HTN 02/02/2018   PCP:  Bridget Hartshorn, NP Pharmacy:   [facility], [place] [address] Phone: [phone] Fax: [phone]     Social Determinants of Health (SDOH) Interventions    Readmission Risk Interventions     No data to display

## 2022-11-19 NOTE — Evaluation (Signed)
Occupational Therapy Evaluation Patient Details Name: [name] MRN: [id number] DOB: 24-Jul-1951 Today's Date: 11/19/2022   History of Present Illness [name] is a 71 y.o. female with medical history significant of HTN, pre-diabetes, hypothyroidism, NAFLD, Hx of right breast cancer s/p lumpectomy, adjuvant chemotherapy, radiation, herceptin maintenance and currently on antiestrogen therapy, persistent atrial fibrillation on Eliquis who presents with hypoxia. Patient admitted with   Acute hypoxemic and hypercarbic respiratory failure and heart failure.   Clinical Impression   [name] is a 71 year old woman who presents with decreased activity tolerance and impaired cardiopulmonary endurance on 3 L [place]. At baseline she doesn't wear oxygen and she is modified independent at home with rollator and has chronic back pain. On evaluation she is min assist for supine to sit, min guard to ambulate to bathroom with walker on 3 L and min assist for LB ADLs. She doesn't exhibit any dyspnea with activity. Once returned to recliner patient's o2 decreased to 2 L aat 97%. Patient became emotional with husband in the room - crying that she wanted to go home and not get stuck in the hospital.  Patient will benefit from skilled OT services while in hospital to improve deficits and learn compensatory strategies as needed in order to return to PLOF.  From a functional standpoint patient doing well and should progress nicely. Recommend intermittent assistance at discharge and [place].     Recommendations for follow up therapy are one component of a multi-disciplinary discharge planning process, led by the attending physician.  Recommendations may be updated based on patient status, additional functional criteria and insurance authorization.   Follow Up Recommendations  Home health OT     Assistance Recommended at Discharge Intermittent Supervision/Assistance  Patient can return home with the  following A little help with bathing/dressing/bathroom;Assistance with cooking/housework;Help with stairs or ramp for entrance    Functional Status Assessment  Patient has had a recent decline in their functional status and demonstrates the ability to make significant improvements in function in a reasonable and predictable amount of time.  Equipment Recommendations  None recommended by OT    Recommendations for Other Services       Precautions / Restrictions Precautions Precautions: Other (comment);Fall Precaution Comments: monitor oxygen Restrictions Weight Bearing Restrictions: No      Mobility Bed Mobility Overal bed mobility: Needs Assistance Bed Mobility: Supine to Sit     Supine to sit: Min assist, HOB elevated     General bed mobility comments: min assist for hand hold    Transfers Overall transfer level: Needs assistance Equipment used: Rolling walker (2 wheels) Transfers: Sit to/from Stand Sit to Stand: Min guard           General transfer comment: Min guard to ambulate to bathroom and back with walker      Balance Overall balance assessment: Mild deficits observed, not formally tested                                         ADL either performed or assessed with clinical judgement   ADL Overall ADL's : Needs assistance/impaired Eating/Feeding: Independent   Grooming: Supervision/safety;Standing   Upper Body Bathing: Set up;Sitting   Lower Body Bathing: Minimal assistance;Sit to/from stand   Upper Body Dressing : Set up;Sitting   Lower Body Dressing: Minimal assistance;Sit to/from stand Lower Body Dressing Details (indicate cue type and  reason): difficulty reaching toes to don sock Toilet Transfer: Min guard;Regular Toilet;Rolling walker (2 wheels);Grab bars   Toileting- Clothing Manipulation and Hygiene: Supervision/safety;Sit to/from stand       Functional mobility during ADLs: Min guard;Rolling walker (2 wheels)        Vision Baseline Vision/History: 1 Wears glasses Patient Visual Report: No change from baseline       Perception     Praxis      Pertinent Vitals/Pain Pain Assessment Pain Assessment: No/denies pain     Hand Dominance Right   Extremity/Trunk Assessment Upper Extremity Assessment Upper Extremity Assessment: Overall WFL for tasks assessed   Lower Extremity Assessment Lower Extremity Assessment: Defer to PT evaluation   Cervical / Trunk Assessment Cervical / Trunk Assessment: Kyphotic   Communication Communication Communication: No difficulties   Cognition Arousal/Alertness: Awake/alert Behavior During Therapy: Anxious Overall Cognitive Status: Within Functional Limits for tasks assessed                                 General Comments: Became emotional at end of evaluation - crying thinking her family was going to try to "Stick her here in the hospital"     General Comments       Exercises     Shoulder Instructions      Home Living Family/patient expects to be discharged to:: Private residence Living Arrangements: Spouse/significant other Available Help at Discharge: Family;Available 24 hours/day Type of Home: House Home Access: Stairs to enter [employer] of Steps: 4 Entrance Stairs-Rails: Left Home Layout: One level     Bathroom Shower/Tub: Occupational psychologist: Standard     Home Equipment: Rollator (4 wheels);BSC/3in1;Shower seat - built in;Grab bars - tub/shower          Prior Functioning/Environment Prior Level of Function : Independent/Modified Independent             Mobility Comments: uses a rollator ADLs Comments: independent but reports difficulty donning pants        OT Problem List: Decreased activity tolerance;Cardiopulmonary status limiting activity;Obesity      OT Treatment/Interventions: Self-care/ADL training;Patient/family education;Therapeutic activities;DME and/or AE  instruction;Therapeutic exercise    OT Goals(Current goals can be found in the care plan section) Acute Rehab OT Goals Patient Stated Goal: to go home OT Goal Formulation: With patient Time For Goal Achievement: 12/03/22 Potential to Achieve Goals: Good  OT Frequency: Min 2X/week    Co-evaluation              AM-PAC OT "6 Clicks" Daily Activity     Outcome Measure Help from another person eating meals?: None Help from another person taking care of personal grooming?: A Little Help from another person toileting, which includes using toliet, bedpan, or urinal?: A Little Help from another person bathing (including washing, rinsing, drying)?: A Little Help from another person to put on and taking off regular upper body clothing?: A Little Help from another person to put on and taking off regular lower body clothing?: A Little 6 Click Score: 19   End of Session Equipment Utilized During Treatment: Rolling walker (2 wheels);Oxygen Nurse Communication: Mobility status  Activity Tolerance: Patient tolerated treatment well Patient left: in chair;with chair alarm set;with family/visitor present  OT Visit Diagnosis: Muscle weakness (generalized) (M62.81)                Time: 4166-0630 OT Time Calculation (min): 19 min Charges:  OT General Charges $OT Visit: 1 Visit OT Evaluation $OT Eval Low Complexity: 1 Low  Gustavo Lah, OTR/L [place]  Office [phone]   Lenward Chancellor 11/19/2022, 3:39 PM

## 2022-11-19 NOTE — Progress Notes (Signed)
Chaplain met with Stanton Kidney who was distressed but unable to have a conversation at the time.  She requested a book and some cross word puzzles and chaplain brought her those as a way of providing emotional support.  [address], [place] Pager, [phone]

## 2022-11-19 NOTE — Consult Note (Signed)
[facility] Consult   Reason for Consult:  Depression and anxiety Referring Physician:  Dr. Maylene Roes Patient Identification: [name] MRN:  [id number] Principal Diagnosis: Acute respiratory failure with hypoxia and hypercapnia ([place]) Diagnosis:  Principal Problem:   Acute respiratory failure with hypoxia and hypercapnia ([place]) Active Problems:   Hypothyroidism   Chronic back pain   Alcohol abuse   Benign essential HTN   Malignant neoplasm of upper-inner quadrant of right breast in female, estrogen receptor positive ([place])   Hypokalemia   Morbid obesity ([place])   Acute on chronic diastolic (congestive) heart failure ([place])   Pulmonary edema   Total Time spent with patient: 1 hour  Subjective:   [name] is a 71 y.o. female patient admitted with acute hypoxic respiratory failure. Psych consult placed after family expressed concerns about depression, anxiety, alcohol use abuse, narcotic abuse, and  medication non compliance. Patient has been very resistant to treatment for her mental health, and usually denies services when offered.   Patient reports history of poor coping skills.  Patient acknowledges having worsening depressive symptoms, anxiety and increase in alcohol intake. During my psychiatric evaluation she was jovial, pleasant, and calm and able to answer all questions without any signs of irritability and or mania.  Patient further reports having a history of depressive symptoms, anxiety, anger all complicated by alcohol use.  She does acknowledge negative impacts of alcohol use on her mental health in addition to her physical health.  She denies any acute symptoms of mania at this time to include impulsivity, grandiosity, mood lability, hypersexuality.  He does not present with any of the above symptoms on this evaluation. She endorses He  depressive symptoms to include anhedonia, hopelessness, worthlessness, guilty, sadness, and insomnia. In reference to her  insomnia, she does not wear her CPAP machine as she should. She denies any acute psychosis, paranoia.  She does not appear to be displaying any or responding to internal stimuli, external stimuli, or exhibiting delusional thought disorder.  Patient denies any access to weapons, denies any alcohol and or substance abuse.  She reports moderate sleep and fair appetite.  Patient denies any auditory and/or visual hallucinations, does not appear to be responding to internal or external stimuli.  There is no evidence of delusional thought content.   Patient denies having a mental health diagnosis and reports that she has never received outpatient counseling or medication management.  However she does seem to have good rapport with her primary care provider, and follows up with specialist as directed. Last Routine wellness on 11/17/2022.    On evaluation patient is alert and oriented x 4.  Patient is calm and cooperative, engages well with this psychiatric provider.  Patient's eye contact, speech, mood, all appear to be normal.  Patient continues to refute any suicidality at this time.  She further denies any recent suicidal thoughts, suicidal ideations, and or nonsuicidal self-injurious behavior.  She does appear to be open to medication management in an inpatient setting, and will liek to start while here.  Did review some antidepressants preferably Zoloft  in which patient would benefit from.  Discussed with patient Zoloft 25 mg will be beneficial for depression, depression, mood swings. .  Current protective factors include hopefulness, positive problem-solving skills, accesses emergency services, lack of SI or HI, engagement with treatment , good insight, current treatment compliance and forward thinking/planning for future.  Patient does not have access to guns, weapons, and or objects that pose a threat to  her or others.  Given the patient's presentation at the time of this assessment and considering the  above noted risk and protective factors, in my clinical judgment the patient's current risk potential for suicidal behavior is low acutely and low chronically, and their current risk potential for harm to others is low.    HPI: [name] is a 71 y.o. female with medical history significant of HTN, pre-diabetes, hypothyroidism, NAFLD, Hx of right breast cancer s/p lumpectomy, adjuvant chemotherapy, radiation, herceptin maintenance and currently on antiestrogen therapy, persistent atrial fibrillation on Eliquis who presents with hypoxia.  Patient presented with chief complaints of wheezing, productive cough, difficulty sleeping, increasing lower extremity edema.  In the emergency department, she was requiring 3 L nasal cannula O2.  Chest x-ray with pulmonary congestion.  Patient was started on IV Lasix and admitted to the hospital.  Due to elevation in pCO2, patient was placed on BiPAP and admitted to stepdown unit.   Past Psychiatric History:  Risk to Self:   Denies Risk to Others:  Denies Prior Inpatient Therapy:   Denies Prior Outpatient Therapy:   Denies  Past Medical History:  Past Medical History:  Diagnosis Date   Anxiety    Atrial fibrillation [facility])    sees Dr. Audie Box   Bilateral swelling of feet    Cancer [facility])    breast   Chronic diarrhea    Chronic pain    Concussion 02/2007   ICU x 3 days   Depression    Hypertension    Hypothyroidism    Personal history of chemotherapy    Personal history of radiation therapy    Spinal stenosis    Thyroid disease ?1994   Vitamin D deficiency     Past Surgical History:  Procedure Laterality Date   BREAST BIOPSY Right 12/20/2019   x2   BREAST LUMPECTOMY Right 01/22/2020   BREAST LUMPECTOMY WITH RADIOACTIVE SEED AND SENTINEL LYMPH NODE BIOPSY Right 01/22/2020   Procedure: RIGHT BREAST LUMPECTOMY WITH RADIOACTIVE SEED X2 AND RIGHT SENTINEL LYMPH NODE MAPPING;  Surgeon: Erroll Luna, MD;  Location: [place];   Service: General;  Laterality: Right;   BREAST SURGERY Right 03/1998   breast biopsy, benign   EYE SURGERY Right    PORT-A-CATH REMOVAL Right 04/30/2021   Procedure: REMOVAL PORT-A-CATH;  Surgeon: Erroll Luna, MD;  Location: [place];  Service: General;  Laterality: Right;   PORTACATH PLACEMENT Right 01/22/2020   Procedure: INSERTION PORT-A-CATH WITH ULTRASOUND;  Surgeon: Erroll Luna, MD;  Location: [place];  Service: General;  Laterality: Right;   PORTACATH PLACEMENT Right 02/28/2020   Procedure: PORT A CATH REVISION;  Surgeon: Erroll Luna, MD;  Location: [place];  Service: General;  Laterality: Right;   REVERSE SHOULDER ARTHROPLASTY Left 02/02/2021   Procedure: LEFT REVERSE SHOULDER ARTHROPLASTY;  Surgeon: Meredith Pel, MD;  Location: [place];  Service: Orthopedics;  Laterality: Left;   SHOULDER SURGERY Right    SPINAL FUSION  03/04/2011   with ORIF   Family History:  Family History  Problem Relation Age of Onset   Thyroid disease Mother    Dementia Mother    Depression Mother    Anxiety disorder Mother    Diabetes Father    Stroke Father    Alcoholism Father    Hypertension Sister    Diabetes Sister    Colon cancer Neg Hx    Colon polyps Neg Hx    Stomach cancer Neg Hx  Esophageal cancer Neg Hx    Family Psychiatric  History: Denies Social History:  Social History   Substance and Sexual Activity  Alcohol Use Yes   Alcohol/week: 5.0 standard drinks of alcohol   Types: 5 Standard drinks or equivalent per week   Comment: wine     Social History   Substance and Sexual Activity  Drug Use No    Social History   Socioeconomic History   Marital status: Married    Spouse name: Not on file   Number of children: 1   Years of education: Not on file   Highest education level: Not on file  Occupational History   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  Tobacco Use   Smoking status: Former    Years: 20.00    Types:  Cigarettes    Quit date: 1980    Years since quitting: 43.9   Smokeless tobacco: Never  Vaping Use   Vaping Use: Never used  Substance and Sexual Activity   Alcohol use: Yes    Alcohol/week: 5.0 standard drinks of alcohol    Types: 5 Standard drinks or equivalent per week    Comment: wine   Drug use: No   Sexual activity: Not Currently    Partners: Male    Birth control/protection: Post-menopausal  Other Topics Concern   Not on file  Social History Narrative   Not on file   Social Determinants of Health   Financial Resource Strain: Not on file  Food Insecurity: No Food Insecurity (11/19/2022)   Hunger Vital Sign    Worried About Running Out of Food in the Last Year: Never true    Ran Out of Food in the Last Year: Never true  Transportation Needs: No Transportation Needs (11/19/2022)   PRAPARE - Hydrologist (Medical): No    Lack of Transportation (Non-Medical): No  Physical Activity: Not on file  Stress: Not on file  Social Connections: Not on file   Additional Social History:    Allergies:   Allergies  Allergen Reactions   Zolpidem Tartrate Other (See Comments)    Hallucinations and Confusion Other reaction(s): Hallucinations confusion     Labs:  Results for orders placed or performed during the hospital encounter of 11/17/22 (from the past 48 hour(s))  Troponin I (High Sensitivity)     Status: Abnormal   Collection Time: 11/17/22  5:19 PM  Result Value Ref Range   Troponin I (High Sensitivity) 48 (H) <18 ng/L    Comment: (NOTE) Elevated high sensitivity troponin I (hsTnI) values and significant  changes across serial measurements may suggest ACS but many other  chronic and acute conditions are known to elevate hsTnI results.  Refer to the "Links" section for chest pain algorithms and additional  guidance. Performed at [facility], [place] [address] [place], [place] [address]   Basic metabolic panel      Status: Abnormal   Collection Time: 11/18/22  4:46 AM  Result Value Ref Range   Sodium 145 135 - 145 mmol/L   Potassium 3.3 (L) 3.5 - 5.1 mmol/L   Chloride 99 98 - 111 mmol/L   CO2 39 (H) 22 - 32 mmol/L   Glucose, Bld 141 (H) 70 - 99 mg/dL    Comment: Glucose reference range applies only to samples taken after fasting for at least 8 hours.   BUN 23 8 - 23 mg/dL   Creatinine, Ser 0.76 0.44 - 1.00 mg/dL   Calcium 8.1 (L)  8.9 - 10.3 mg/dL   GFR, Estimated >60 >60 mL/min    Comment: (NOTE) Calculated using the CKD-EPI Creatinine Equation (2021)    Anion gap 7 5 - 15    Comment: Performed at [facility], [place] [address], [place], [place] [address]  Blood gas, venous     Status: Abnormal   Collection Time: 11/18/22  4:47 AM  Result Value Ref Range   pH, Ven 7.31 7.25 - 7.43   pCO2, Ven 87 (HH) 44 - 60 mmHg    Comment: CRITICAL RESULT CALLED TO, READ BACK BY AND VERIFIED WITH: [name],K AT 0505 ON 11/18/22 BY [email or web address]    pO2, Ven 64 (H) 32 - 45 mmHg   Bicarbonate 43.8 (H) 20.0 - 28.0 mmol/L   Acid-Base Excess 13.8 (H) 0.0 - 2.0 mmol/L   O2 Saturation 92 %   Patient temperature 37.0     Comment: Performed at [facility], [place] [address], [place], [place] [address]  Blood gas, arterial     Status: Abnormal   Collection Time: 11/18/22 10:25 AM  Result Value Ref Range   O2 Content 2.0 L/min   Delivery systems NASAL CANNULA    pH, Arterial 7.31 (L) 7.35 - 7.45   pCO2 arterial 88 (HH) 32 - 48 mmHg    Comment: CRITICAL RESULT CALLED TO, READ BACK BY AND VERIFIED WITH: A.WILSON AT 1038 11/18/2022 BY K.DAVIS    pO2, Arterial 70 (L) 83 - 108 mmHg   Bicarbonate 44.3 (H) 20.0 - 28.0 mmol/L   Acid-Base Excess 13.8 (H) 0.0 - 2.0 mmol/L   O2 Saturation 95.7 %   Patient temperature 37.0    Collection site RIGHT RADIAL    Drawn by [id number]    Allens test (pass/fail) PASS PASS    Comment: Performed at [facility], [place] [address]  [place], [place] [address]  Blood gas, arterial     Status: Abnormal   Collection Time: 11/18/22  2:51 PM  Result Value Ref Range   pH, Arterial 7.39 7.35 - 7.45   pCO2 arterial 72 (HH) 32 - 48 mmHg    Comment: CRITICAL RESULT CALLED TO, READ BACK BY AND VERIFIED WITH: Z.OSIPENKO, RN AT 1514 ON 11.30.23 BY N.THOMPSON    pO2, Arterial 65 (L) 83 - 108 mmHg   Bicarbonate 43.6 (H) 20.0 - 28.0 mmol/L   Acid-Base Excess 15.0 (H) 0.0 - 2.0 mmol/L   O2 Saturation 96 %   Patient temperature 37.0    Allens test (pass/fail) PASS PASS    Comment: Performed at [facility], [place] [address], [place], [place] [address]  Basic metabolic panel     Status: Abnormal   Collection Time: 11/19/22  3:13 AM  Result Value Ref Range   Sodium 144 135 - 145 mmol/L   Potassium 3.0 (L) 3.5 - 5.1 mmol/L   Chloride 100 98 - 111 mmol/L   CO2 37 (H) 22 - 32 mmol/L   Glucose, Bld 124 (H) 70 - 99 mg/dL    Comment: Glucose reference range applies only to samples taken after fasting for at least 8 hours.   BUN 22 8 - 23 mg/dL   Creatinine, Ser 0.59 0.44 - 1.00 mg/dL   Calcium 8.2 (L) 8.9 - 10.3 mg/dL   GFR, Estimated >60 >60 mL/min    Comment: (NOTE) Calculated using the CKD-EPI Creatinine Equation (2021)    Anion gap 7 5 - 15    Comment: Performed at [facility]  [place] [address], [place], [place] [address]  Blood gas, arterial     Status: Abnormal   Collection Time: 11/19/22  4:55 AM  Result Value Ref Range   FIO2 30 %   RATE 16 resp/min   Inspiratory PAP 22 cmH2O   Expiratory PAP 10 cmH2O   pH, Arterial 7.41 7.35 - 7.45   pCO2 arterial 70 (HH) 32 - 48 mmHg    Comment: CRITICAL RESULT CALLED TO, READ BACK BY AND VERIFIED WITH: MERCADO,P RN @ 0516 ON [id number] BY MAHMOUD,S    pO2, Arterial 44 (L) 83 - 108 mmHg   Bicarbonate 44.4 (H) 20.0 - 28.0 mmol/L   Acid-Base Excess 16.1 (H) 0.0 - 2.0 mmol/L   O2 Saturation 80.1 %   Patient temperature 37.1    Collection site LEFT RADIAL     Drawn by [id number]    Allens test (pass/fail) PASS PASS    Comment: Performed at [facility], [place] [address] [place], [place] [address]  MRSA Next Gen by PCR, Nasal     Status: None   Collection Time: 11/19/22  6:42 AM   Specimen: Nasal Mucosa; Nasal Swab  Result Value Ref Range   MRSA by PCR Next Gen NOT DETECTED NOT DETECTED    Comment: (NOTE) The GeneXpert MRSA Assay (FDA approved for NASAL specimens only), is one component of a comprehensive MRSA colonization surveillance program. It is not intended to diagnose MRSA infection nor to guide or monitor treatment for MRSA infections. Test performance is not FDA approved in patients less than 27 years old. Performed at [facility], [place] [address], [place], [place] [address]   Blood gas, arterial     Status: Abnormal   Collection Time: 11/19/22 12:53 PM  Result Value Ref Range   pH, Arterial 7.47 (H) 7.35 - 7.45   pCO2 arterial 64 (H) 32 - 48 mmHg   pO2, Arterial 127 (H) 83 - 108 mmHg   Bicarbonate 46.6 (H) 20.0 - 28.0 mmol/L   Acid-Base Excess 18.7 (H) 0.0 - 2.0 mmol/L   O2 Saturation 100 %   Patient temperature 36.0    Collection site LEFT RADIAL    Drawn by [id number]     Comment: Performed at [facility], [place] [address], [place], [place] [address]    Current Facility-Administered Medications  Medication Dose Route Frequency Provider Last Rate Last Admin   0.9 %  sodium chloride infusion  250 mL Intravenous PRN Tu, Ching T, DO       acetaminophen (TYLENOL) tablet 650 mg  650 mg Oral Q4H PRN Tu, Ching T, DO       anastrozole (ARIMIDEX) tablet 1 mg  1 mg Oral Daily Tu, Ching T, DO   1 mg at 11/19/22 1041   apixaban (ELIQUIS) tablet 5 mg  5 mg Oral BID Tu, Ching T, DO   5 mg at 11/19/22 1039   carvedilol (COREG) tablet 12.5 mg  12.5 mg Oral BID Dessa Phi, DO   12.5 mg at 11/19/22 1038   Chlorhexidine Gluconate Cloth 2 % PADS 6 each  6 each Topical Daily Dessa Phi,  DO   6 each at 11/19/22 1045   diazepam (VALIUM) tablet 10 mg  10 mg Oral QHS PRN Dessa Phi, DO       folic acid (FOLVITE) tablet 1 mg  1 mg Oral Daily Tu, Ching T, DO   1 mg at 11/19/22 1040   furosemide (LASIX)  injection 80 mg  80 mg Intravenous BID Dessa Phi, DO       hydrALAZINE (APRESOLINE) injection 10 mg  10 mg Intravenous Q6H PRN Tu, Ching T, DO       levothyroxine (SYNTHROID) tablet 175 mcg  175 mcg Oral QAC breakfast Dessa Phi, DO       LORazepam (ATIVAN) tablet 1-4 mg  1-4 mg Oral Q1H PRN Dessa Phi, DO       Or   LORazepam (ATIVAN) injection 1-4 mg  1-4 mg Intravenous Q1H PRN Dessa Phi, DO   2 mg at 11/19/22 1052   losartan (COZAAR) tablet 50 mg  50 mg Oral Daily Dessa Phi, DO   50 mg at 11/19/22 1039   multivitamin with minerals tablet 1 tablet  1 tablet Oral Daily Tu, Ching T, DO   1 tablet at 11/19/22 1040   ondansetron (ZOFRAN) injection 4 mg  4 mg Intravenous Q6H PRN Tu, Ching T, DO       Oral care mouth rinse  15 mL Mouth Rinse PRN Dessa Phi, DO       potassium chloride SA (KLOR-CON M) CR tablet 40 mEq  40 mEq Oral Once Dessa Phi, DO       sodium chloride flush (NS) 0.9 % injection 3 mL  3 mL Intravenous Q12H Tu, Ching T, DO   3 mL at 11/19/22 1045   sodium chloride flush (NS) 0.9 % injection 3 mL  3 mL Intravenous PRN Tu, Ching T, DO       thiamine (VITAMIN B1) tablet 100 mg  100 mg Oral Daily Tu, Ching T, DO   100 mg at 11/18/22 1015   Or   thiamine (VITAMIN B1) injection 100 mg  100 mg Intravenous Daily Tu, Ching T, DO   100 mg at 11/19/22 1040    Musculoskeletal: Strength & Muscle Tone: within normal limits Gait & Station: normal Patient leans: N/A            Psychiatric Specialty Exam:  Presentation  General Appearance:  Appropriate for Environment; Casual (guarded)  Eye Contact: Fair  Speech: Clear and Coherent; Normal Rate  Speech Volume: Normal  Handedness: Right   Mood and Affect   Mood: Depressed; Anxious  Affect: Depressed   Thought Process  Thought Processes: Coherent; Linear  Descriptions of Associations:Intact  Orientation:Full (Time, Place and Person)  Thought Content:Logical  History of Schizophrenia/Schizoaffective disorder:No data recorded Duration of Psychotic Symptoms:No data recorded Hallucinations:Hallucinations: None  Ideas of Reference:None  Suicidal Thoughts:Suicidal Thoughts: No  Homicidal Thoughts:Homicidal Thoughts: No   Sensorium  Memory: Immediate Good; Recent Good; Remote Good  Judgment: Fair  Insight: Fair   Community education officer  Concentration: Fair  Attention Span: Good  Recall: Good  Fund of Knowledge: Good  Language: Good   Psychomotor Activity  Psychomotor Activity: Psychomotor Activity: Normal   Assets  Assets: Communication Skills; Desire for Improvement; Leisure Time; Physical Health; Resilience   Sleep  Sleep: Sleep: Fair   Physical Exam: Physical Exam Vitals and nursing note reviewed.  Constitutional:      Appearance: Normal appearance. She is obese.  HENT:     Head: Normocephalic.  Neurological:     General: No focal deficit present.     Mental Status: She is alert and oriented to person, place, and time. Mental status is at baseline.  Psychiatric:        Attention and Perception: Attention and perception normal.        Mood and Affect: Mood  is anxious and depressed.        Speech: Speech normal.        Behavior: Behavior normal. Behavior is cooperative.        Thought Content: Thought content normal.        Cognition and Memory: Cognition and memory normal.        Judgment: Judgment normal.    ROS Blood pressure 139/71, pulse 62, temperature 98.2 F (36.8 C), temperature source Oral, resp. rate 14, height '5\' 6"'$  (1.676 m), weight 122.3 kg, SpO2 100 %. Body mass index is 43.52 kg/m.  Treatment Plan Summary: Daily contact with patient to assess and evaluate symptoms  and progress in treatment, Medication management, and Plan    Endorses drinking  1-2 drinks daily. Denies any other recreational drug use.  PLAN  CIWA protocol  Continue to monitor/Ativan detox  Folate, vitamin B12 and thiamine levels  Continue folic acid supplementation   Continue daily Thiamine 100 mg daily -Continue Synthroid tablet 148mg po daily -Consult TOC for substance use resources, patient does not express interest in inpatient rehab at this time, nor does she meet criteria for IVC.   She also reports not providing consent unless she is present to speak with her husband an/or daughter.   Depression: Will start Zoloft '25mg'$  po daily, and increase to Zoloft '50mg'$  po daily on Sunday.  Actions taken at this time to mitigate risk include: - Provided patient and/or family member/significant other with information about the National Suicide Prevention Hotline at 1-800-273-TALK (8255) at discharge.  Provided substance use resources.  Follow-up with primary care provider, and contacting resources identified in AVS for all outpatient appointment. Will speak with family when able to have all parties present.    Disposition: No evidence of imminent risk to self or others at present.   Patient does not meet criteria for psychiatric inpatient admission.  TSuella Broad FNP 11/19/2022 4:59 PM

## 2022-11-20 DIAGNOSIS — J9602 Acute respiratory failure with hypercapnia: Secondary | ICD-10-CM | POA: Diagnosis not present

## 2022-11-20 DIAGNOSIS — J9601 Acute respiratory failure with hypoxia: Secondary | ICD-10-CM | POA: Diagnosis not present

## 2022-11-20 LAB — BASIC METABOLIC PANEL
Anion gap: 9 (ref 5–15)
BUN: 24 mg/dL — ABNORMAL HIGH (ref 8–23)
CO2: 36 mmol/L — ABNORMAL HIGH (ref 22–32)
Calcium: 8.3 mg/dL — ABNORMAL LOW (ref 8.9–10.3)
Chloride: 100 mmol/L (ref 98–111)
Creatinine, Ser: 0.71 mg/dL (ref 0.44–1.00)
GFR, Estimated: >60 mL/min (ref >60)
Glucose, Bld: 127 mg/dL — ABNORMAL HIGH (ref 70–99)
Potassium: 3.5 mmol/L (ref 3.5–5.1)
Sodium: 145 mmol/L (ref 135–145)

## 2022-11-20 LAB — MAGNESIUM: Magnesium: 1.9 mg/dL (ref 1.7–2.4)
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
## 2022-11-20 NOTE — Progress Notes (Signed)
PROGRESS NOTE    [name]  LFY:[id number] DOB: Sep 09, 1951 DOA: 11/17/2022 PCP: Bridget Hartshorn, NP     Brief Narrative:  [name] is a 71 y.o. female with medical history significant of HTN, pre-diabetes, hypothyroidism, NAFLD, Hx of right breast cancer s/p lumpectomy, adjuvant chemotherapy, radiation, herceptin maintenance and currently on antiestrogen therapy, persistent atrial fibrillation on Eliquis who presents with hypoxia.  Patient presented with chief complaints of wheezing, productive cough, difficulty sleeping, increasing lower extremity edema.  In the emergency department, she was requiring 3 L nasal cannula O2.  Chest x-ray with pulmonary congestion.  Patient was started on IV Lasix and admitted to the hospital.  Due to elevation in pCO2, patient was placed on BiPAP and admitted to stepdown unit.  Patient had improvement in mentation and was weaned off BiPAP.  New events last 24 hours / Subjective: Patient seen with husband and daughter at bedside.  Patient has voiced that she does not want staff to give family updates privately over the phone.  Patient feels that her family "gangs up" on her.  We discussed patient's alcohol dependence.  She states that she drinks couple glasses of wine daily, has been drinking for decades.  Daughter and husband are not exactly sure how much she drinks on daily basis.  Patient states that she quit drinking from September to February but relapsed because her husband did not hold up his end of the bargain.  She is not interested in AA or going to a facility.  She states that she wants to go home.  Family is very concerned about taking her home.  Assessment & Plan:   Principal Problem:   Acute respiratory failure with hypoxia and hypercapnia (HCC) Active Problems:   Hypothyroidism   Chronic back pain   Alcohol abuse   Benign essential HTN   Malignant neoplasm of upper-inner quadrant of right breast in female, estrogen receptor  positive (HCC)   Hypokalemia   Morbid obesity (HCC)   Acute on chronic diastolic (congestive) heart failure (HCC)   Pulmonary edema   Acute hypoxemic and hypercarbic respiratory failure -SpO2 documented as low as 82% on 2L O2  -Due to her somnolence, ABG was obtained which revealed hypercarbia with pCO2 88, pO2 70  -COVID, influenza negative -CTA chest negative for PE -Now weaned off BiPAP today -Continue to wean off oxygen as able  Acute on chronic diastolic heart failure -Strict I's and O's, daily weight -Echocardiogram revealed EF 60 to 65%, no regional wall motion abnormality, mild LVH -Continue IV Lasix.  Patient remains fluid overloaded  A-fib -Coreg, Eliquis  Demand ischemia -Secondary to above, respiratory failure, heart failure -Troponin trend 45, 48  Hypertension -Coreg, Cozaar  Hypothyroidism -Synthroid  Alcohol abuse -CIWA protocol  Malignant neoplasm right breast -Status postlumpectomy, chemo and radiation -Followed by Dr. Lindi Adie -Anastrozole   Depression -Appreciate Psych eval -Started on Zoloft, increase to '50mg'$  12/3    DVT prophylaxis:  apixaban (ELIQUIS) tablet 5 mg  Code Status: Full Family Communication: Husband and daughter at bedside Disposition Plan:  Status is: Inpatient Remains inpatient appropriate because: IV Lasix   Antimicrobials:  Anti-infectives (From admission, onward)    None        Objective: Vitals:   11/20/22 0400 11/20/22 0808 11/20/22 0820 11/20/22 0834  BP: 130/76  (!) 170/93   Pulse: 72     Resp: (!) 23     Temp:    98.2 F (36.8 C)  TempSrc:  Oral  SpO2: 96% 100%    Weight:      Height:        Intake/Output Summary (Last 24 hours) at 11/20/2022 1051 Last data filed at 11/19/2022 1142 Gross per 24 hour  Intake --  Output 750 ml  Net -750 ml    Filed Weights   11/17/22 1507 11/18/22 1256 11/19/22 0620  Weight: 113.4 kg 120.2 kg 122.3 kg   Examination: General exam: Appears calm and  comfortable  Respiratory system: Diminished breath sounds, no respiratory distress  Cardiovascular system: S1 & S2 heard, RRR. +Bilateral pedal edema. Gastrointestinal system: Abdomen is nondistended, soft and nontender. Normal bowel sounds heard. Central nervous system: Alert and oriented. Non focal exam. Speech clear  Extremities: Symmetric in appearance bilaterally  Skin: No rashes, lesions or ulcers on exposed skin  Psychiatry: Judgement and insight appear poor.   Data Reviewed: I have personally reviewed following labs and imaging studies  CBC: Recent Labs  Lab 11/17/22 1516  WBC 6.8  NEUTROABS 4.6  HGB 14.3  HCT 45.4  MCV 105.8*  PLT 701    Basic Metabolic Panel: Recent Labs  Lab 11/17/22 1516 11/18/22 0446 11/19/22 0313 11/20/22 0313  NA 145 145 144 145  K 2.8* 3.3* 3.0* 3.5  CL 100 99 100 100  CO2 36* 39* 37* 36*  GLUCOSE 107* 141* 124* 127*  BUN 27* 23 22 24*  CREATININE 0.97 0.76 0.59 0.71  CALCIUM 8.8* 8.1* 8.2* 8.3*  MG  --   --   --  1.9    GFR: Estimated Creatinine Clearance: 83.5 mL/min (by C-G formula based on SCr of 0.71 mg/dL). Liver Function Tests: Recent Labs  Lab 11/17/22 1516  AST 18  ALT 14  ALKPHOS 86  BILITOT 0.6  PROT 6.7  ALBUMIN 3.5    No results for input(s): "LIPASE", "AMYLASE" in the last 168 hours. No results for input(s): "AMMONIA" in the last 168 hours. Coagulation Profile: No results for input(s): "INR", "PROTIME" in the last 168 hours. Cardiac Enzymes: No results for input(s): "CKTOTAL", "CKMB", "CKMBINDEX", "TROPONINI" in the last 168 hours. BNP (last 3 results) No results for input(s): "PROBNP" in the last 8760 hours. HbA1C: No results for input(s): "HGBA1C" in the last 72 hours. CBG: No results for input(s): "GLUCAP" in the last 168 hours. Lipid Profile: No results for input(s): "CHOL", "HDL", "LDLCALC", "TRIG", "CHOLHDL", "LDLDIRECT" in the last 72 hours. Thyroid Function Tests: No results for input(s):  "TSH", "T4TOTAL", "FREET4", "T3FREE", "THYROIDAB" in the last 72 hours. Anemia Panel: Recent Labs    11/19/22 1805  VITAMINB12 700  FOLATE 14.1   Sepsis Labs: No results for input(s): "PROCALCITON", "LATICACIDVEN" in the last 168 hours.  Recent Results (from the past 240 hour(s))  Resp Panel by RT-PCR (Flu A&B, Covid) Anterior Nasal Swab     Status: None   Collection Time: 11/17/22  3:16 PM   Specimen: Anterior Nasal Swab  Result Value Ref Range Status   SARS Coronavirus 2 by RT PCR NEGATIVE NEGATIVE Final    Comment: (NOTE) SARS-CoV-2 target nucleic acids are NOT DETECTED.  The SARS-CoV-2 RNA is generally detectable in upper respiratory specimens during the acute phase of infection. The lowest concentration of SARS-CoV-2 viral copies this assay can detect is 138 copies/mL. A negative result does not preclude SARS-Cov-2 infection and should not be used as the sole basis for treatment or other patient management decisions. A negative result may occur with  improper specimen collection/handling, submission of specimen other  than nasopharyngeal swab, presence of viral mutation(s) within the areas targeted by this assay, and inadequate number of viral copies(<138 copies/mL). A negative result must be combined with clinical observations, patient history, and epidemiological information. The expected result is Negative.  Fact Sheet for Patients:  EntrepreneurPulse.com.au  Fact Sheet for Healthcare Providers:  IncredibleEmployment.be  This test is no t yet approved or cleared by the [place] FDA and  has been authorized for detection and/or diagnosis of SARS-CoV-2 by FDA under an Emergency Use Authorization (EUA). This EUA will remain  in effect (meaning this test can be used) for the duration of the COVID-19 declaration under Section 564(b)(1) of the Act, 21 U.S.C.section 360bbb-3(b)(1), unless the authorization is terminated  or revoked  sooner.       Influenza A by PCR NEGATIVE NEGATIVE Final   Influenza B by PCR NEGATIVE NEGATIVE Final    Comment: (NOTE) The Xpert Xpress SARS-CoV-2/FLU/RSV plus assay is intended as an aid in the diagnosis of influenza from Nasopharyngeal swab specimens and should not be used as a sole basis for treatment. Nasal washings and aspirates are unacceptable for Xpert Xpress SARS-CoV-2/FLU/RSV testing.  Fact Sheet for Patients: EntrepreneurPulse.com.au  Fact Sheet for Healthcare Providers: IncredibleEmployment.be  This test is not yet approved or cleared by the [place] FDA and has been authorized for detection and/or diagnosis of SARS-CoV-2 by FDA under an Emergency Use Authorization (EUA). This EUA will remain in effect (meaning this test can be used) for the duration of the COVID-19 declaration under Section 564(b)(1) of the Act, 21 U.S.C. section 360bbb-3(b)(1), unless the authorization is terminated or revoked.  Performed at [facility], [place] [address], [place], [place] [address]   MRSA Next Gen by PCR, Nasal     Status: None   Collection Time: 11/19/22  6:42 AM   Specimen: Nasal Mucosa; Nasal Swab  Result Value Ref Range Status   MRSA by PCR Next Gen NOT DETECTED NOT DETECTED Final    Comment: (NOTE) The GeneXpert MRSA Assay (FDA approved for NASAL specimens only), is one component of a comprehensive MRSA colonization surveillance program. It is not intended to diagnose MRSA infection nor to guide or monitor treatment for MRSA infections. Test performance is not FDA approved in patients less than 49 years old. Performed at [facility], [place] [address] [place], [place] [address]       Radiology Studies: ECHOCARDIOGRAM COMPLETE  Result Date: 11/18/2022    ECHOCARDIOGRAM REPORT   Patient Name:   CHARMAINE PLACIDO Date of Exam: 11/18/2022 Medical Rec #:  [id number]        Height:       66.0 in  Accession #:    [id number]       Weight:       250.0 lb Date of Birth:  06-Oct-1951        BSA:          2.199 m Patient Age:    19 years         BP:           154/103 mmHg Patient Gender: F                HR:           95 bpm. Exam Location:  Inpatient Procedure: 2D Echo, Cardiac Doppler and Color Doppler Indications:    I50.40* Unspecified combined systolic (congestive) and diastolic                 (  congestive) heart failure  History:        Patient has prior history of Echocardiogram examinations, most                 recent 05/10/2022. CHF, Abnormal ECG, Signs/Symptoms:Dyspnea,                 Shortness of Breath and Edema; Risk Factors:Hypertension. ETOH.                 Breast cancer.  Sonographer:    Roseanna Rainbow RDCS Referring Phys: [id number] Cadott T TU  Sonographer Comments: Technically difficult study due to poor echo windows and patient is obese. Image acquisition challenging due to patient body habitus. IMPRESSIONS  1. Left ventricular ejection fraction, by estimation, is 60 to 65%. The left ventricle has normal function. The left ventricle has no regional wall motion abnormalities. There is mild left ventricular hypertrophy of the septal segment. Left ventricular diastolic parameters are indeterminate.  2. Right ventricular systolic function is normal. The right ventricular size is normal. There is mildly elevated pulmonary artery systolic pressure.  3. No evidence of mitral valve regurgitation.  4. The aortic valve is tricuspid. Aortic valve regurgitation is not visualized. Comparison(s): No significant change from prior study. FINDINGS  Left Ventricle: Left ventricular ejection fraction, by estimation, is 60 to 65%. The left ventricle has normal function. The left ventricle has no regional wall motion abnormalities. The left ventricular internal cavity size was normal in size. There is  mild left ventricular hypertrophy of the septal segment. Left ventricular diastolic parameters are indeterminate. Right  Ventricle: The right ventricular size is normal. Right ventricular systolic function is normal. There is mildly elevated pulmonary artery systolic pressure. The tricuspid regurgitant velocity is 2.58 m/s, and with an assumed right atrial pressure of 15 mmHg, the estimated right ventricular systolic pressure is 41.3 mmHg. Left Atrium: Left atrial size was normal in size. Right Atrium: Right atrial size was normal in size. Pericardium: There is no evidence of pericardial effusion. Mitral Valve: No evidence of mitral valve regurgitation. Tricuspid Valve: Tricuspid valve regurgitation is trivial. Aortic Valve: The aortic valve is tricuspid. Aortic valve regurgitation is not visualized. Pulmonic Valve: Pulmonic valve regurgitation is not visualized. Aorta: The aortic root and ascending aorta are structurally normal, with no evidence of dilitation. IAS/Shunts: No atrial level shunt detected by color flow Doppler.  LEFT VENTRICLE PLAX 2D LVIDd:         4.40 cm LVIDs:         2.80 cm LV PW:         1.80 cm LV IVS:        1.20 cm LVOT diam:     2.10 cm LV SV:         69 LV SV Index:   32 LVOT Area:     3.46 cm  LV Volumes (MOD) LV vol d, MOD A2C: 61.6 ml LV vol d, MOD A4C: 74.9 ml LV vol s, MOD A2C: 13.3 ml LV vol s, MOD A4C: 25.9 ml LV SV MOD A2C:     48.3 ml LV SV MOD A4C:     74.9 ml LV SV MOD BP:      49.8 ml RIGHT VENTRICLE             IVC RV S prime:     10.60 cm/s  IVC diam: 2.40 cm RVOT diam:      3.00 cm TAPSE (M-mode): 1.3 cm LEFT ATRIUM  Index        RIGHT ATRIUM           Index LA diam:        4.40 cm 2.00 cm/m   RA Area:     18.30 cm LA Vol (A2C):   49.7 ml 22.60 ml/m  RA Volume:   46.90 ml  21.33 ml/m LA Vol (A4C):   43.0 ml 19.56 ml/m LA Biplane Vol: 50.0 ml 22.74 ml/m  AORTIC VALVE LVOT Vmax:   110.50 cm/s LVOT Vmean:  70.400 cm/s LVOT VTI:    0.200 m  AORTA Ao Root diam: 2.80 cm Ao Asc diam:  3.70 cm MITRAL VALVE                TRICUSPID VALVE MV Area (PHT): 3.12 cm     TR Peak grad:    26.6 mmHg MV Decel Time: 243 msec     TR Vmax:        258.00 cm/s MV E velocity: 113.00 cm/s                             SHUNTS                             Systemic VTI:  0.20 m                             Systemic Diam: 2.10 cm                             Pulmonic Diam: 3.00 cm Phineas Inches Electronically signed by Phineas Inches Signature Date/Time: 11/18/2022/2:48:01 PM    Final       Scheduled Meds:  anastrozole  1 mg Oral Daily   apixaban  5 mg Oral BID   carvedilol  12.5 mg Oral BID   Chlorhexidine Gluconate Cloth  6 each Topical Daily   folic acid  1 mg Oral Daily   furosemide  80 mg Intravenous BID   levothyroxine  175 mcg Oral QAC breakfast   losartan  50 mg Oral Daily   multivitamin with minerals  1 tablet Oral Daily   [START ON 11/21/2022] sertraline  50 mg Oral Daily   sodium chloride flush  3 mL Intravenous Q12H   thiamine  100 mg Oral Daily   Or   thiamine  100 mg Intravenous Daily   Continuous Infusions:  sodium chloride       LOS: 3 days     Dessa Phi, DO [facility] 11/20/2022, 10:51 AM   Available via Epic secure chat 7am-7pm After these hours, please refer to coverage provider listed on [email or web address]

## 2022-11-20 NOTE — Progress Notes (Signed)
Pt doesn't want to wear bipap said she doesn't wear anything at home. Machine remained bedside if need arise.

## 2022-11-20 NOTE — Progress Notes (Signed)
Patient was not tolerating bipap so RN took patient off. Patient on 3L [place]

## 2022-11-21 DIAGNOSIS — F411 Generalized anxiety disorder: Secondary | ICD-10-CM | POA: Diagnosis present

## 2022-11-21 DIAGNOSIS — J9601 Acute respiratory failure with hypoxia: Secondary | ICD-10-CM | POA: Diagnosis not present

## 2022-11-21 DIAGNOSIS — F1014 Alcohol abuse with alcohol-induced mood disorder: Secondary | ICD-10-CM | POA: Diagnosis present

## 2022-11-21 DIAGNOSIS — F10180 Alcohol abuse with alcohol-induced anxiety disorder: Secondary | ICD-10-CM | POA: Diagnosis present

## 2022-11-21 DIAGNOSIS — J9602 Acute respiratory failure with hypercapnia: Secondary | ICD-10-CM | POA: Diagnosis not present

## 2022-11-21 LAB — BASIC METABOLIC PANEL
Anion gap: 8 (ref 5–15)
BUN: 28 mg/dL — ABNORMAL HIGH (ref 8–23)
CO2: 42 mmol/L — ABNORMAL HIGH (ref 22–32)
Calcium: 8.6 mg/dL — ABNORMAL LOW (ref 8.9–10.3)
Chloride: 95 mmol/L — ABNORMAL LOW (ref 98–111)
Creatinine, Ser: 0.73 mg/dL (ref 0.44–1.00)
GFR, Estimated: >60 mL/min (ref >60)
Glucose, Bld: 141 mg/dL — ABNORMAL HIGH (ref 70–99)
Potassium: 3.2 mmol/L — ABNORMAL LOW (ref 3.5–5.1)
Sodium: 145 mmol/L (ref 135–145)

## 2022-11-21 MED ORDER — POTASSIUM CHLORIDE CRYS ER 20 MEQ PO TBCR
40.0000 meq | EXTENDED_RELEASE_TABLET | Freq: Once | ORAL | Status: AC
  Administered 2022-11-21: 40 meq via ORAL
  Filled 2022-11-21: qty 2

## 2022-11-21 NOTE — Evaluation (Signed)
Physical Therapy Evaluation Patient Details Name: [name] MRN: [id number] DOB: Sep 15, 1951 Today's Date: 11/21/2022  History of Present Illness  [name] is a 71 y.o. female with medical history significant of HTN, pre-diabetes, hypothyroidism, NAFLD, Hx of right breast cancer s/p lumpectomy, adjuvant chemotherapy, radiation, herceptin maintenance and currently on antiestrogen therapy, persistent atrial fibrillation on Eliquis who presents with hypoxia. Patient admitted with   Acute hypoxemic and hypercarbic respiratory failure and heart failure.  Clinical Impression  Pt admitted as above and presenting with functional mobility limitations 2* mild generalized weakness and ambulatory balance deficits.  This date, pt up to ambulate in hall with RW and min guard assist for safety only.  Pt on RA and denies SOB and with O2 sat remaining 90% or higher.  Pt reports ability to mobilize near home baseline but states she is able to walk without RW at times.  Pt hopes to progress to dc home repeatedly asking "can I go home now?".     Recommendations for follow up therapy are one component of a multi-disciplinary discharge planning process, led by the attending physician.  Recommendations may be updated based on patient status, additional functional criteria and insurance authorization.  Follow Up Recommendations No PT follow up      Assistance Recommended at Discharge Intermittent Supervision/Assistance  Patient can return home with the following  A little help with walking and/or transfers;A little help with bathing/dressing/bathroom;Assistance with cooking/housework;Assist for transportation;Help with stairs or ramp for entrance    Equipment Recommendations None recommended by PT  Recommendations for Other Services       Functional Status Assessment Patient has had a recent decline in their functional status and demonstrates the ability to make significant improvements in function in  a reasonable and predictable amount of time.     Precautions / Restrictions Precautions Precautions: Other (comment);Fall Precaution Comments: monitor oxygen Restrictions Weight Bearing Restrictions: No      Mobility  Bed Mobility Overal bed mobility: Needs Assistance Bed Mobility: Supine to Sit     Supine to sit: Min assist, HOB elevated     General bed mobility comments: min assist for hand hold    Transfers Overall transfer level: Needs assistance Equipment used: Rolling walker (2 wheels) Transfers: Sit to/from Stand Sit to Stand: Min guard           General transfer comment: Steady assist only    Ambulation/Gait Ambulation/Gait assistance: Min guard Gait Distance (Feet): 140 Feet Assistive device: Rolling walker (2 wheels) Gait Pattern/deviations: Step-to pattern, Step-through pattern, Decreased step length - right, Decreased step length - left, Shuffle, Trunk flexed Gait velocity: decreased     General Gait Details: cues for posture and position from RW for safety  Stairs            Wheelchair Mobility    Modified Rankin (Stroke Patients Only)       Balance Overall balance assessment: Mild deficits observed, not formally tested                                           Pertinent Vitals/Pain Pain Assessment Pain Assessment: No/denies pain    Home Living Family/patient expects to be discharged to:: Private residence Living Arrangements: Spouse/significant other Available Help at Discharge: Family;Available 24 hours/day Type of Home: House Home Access: Stairs to enter Entrance Stairs-Rails: Left Entrance Stairs-Number of Steps: 4  Home Layout: One level Home Equipment: Rollator (4 wheels);BSC/3in1;Shower seat - built in;Grab bars - tub/shower      Prior Function Prior Level of Function : Independent/Modified Independent             Mobility Comments: uses a rollator       Hand Dominance   Dominant  Hand: Right    Extremity/Trunk Assessment   Upper Extremity Assessment Upper Extremity Assessment: Overall WFL for tasks assessed    Lower Extremity Assessment Lower Extremity Assessment: Generalized weakness    Cervical / Trunk Assessment Cervical / Trunk Assessment: Kyphotic  Communication   Communication: No difficulties  Cognition Arousal/Alertness: Awake/alert Behavior During Therapy: Anxious Overall Cognitive Status: Within Functional Limits for tasks assessed                                 General Comments: Became emotional at end of evaluation - crying thinking her family was going to try to "Stick her here in the hospital"        General Comments      Exercises     Assessment/Plan    PT Assessment Patient needs continued PT services  PT Problem List Decreased strength;Decreased range of motion;Decreased activity tolerance;Decreased balance;Decreased mobility;Decreased knowledge of use of DME;Pain       PT Treatment Interventions DME instruction;Gait training;Stair training;Functional mobility training;Therapeutic activities;Balance training    PT Goals (Current goals can be found in the Care Plan section)  Acute Rehab PT Goals Patient Stated Goal: HOME PT Goal Formulation: With patient Time For Goal Achievement: 12/05/22 Potential to Achieve Goals: Good    Frequency Min 3X/week     Co-evaluation               AM-PAC PT "6 Clicks" Mobility  Outcome Measure                  End of Session Equipment Utilized During Treatment: Gait belt Activity Tolerance: Patient tolerated treatment well Patient left: in chair;with call bell/phone within reach;with family/visitor present Nurse Communication: Mobility status PT Visit Diagnosis: Difficulty in walking, not elsewhere classified (R26.2)    Time: 1610-9604 PT Time Calculation (min) (ACUTE ONLY): 19 min   Charges:   PT Evaluation $PT Eval Low Complexity: [place] Pager [phone] Office [phone]   Crissie Aloi 11/21/2022, 1:03 PM

## 2022-11-21 NOTE — Progress Notes (Signed)
PROGRESS NOTE    [name]  CWC:[id number] DOB: 1951/07/09 DOA: 11/17/2022 PCP: Bridget Hartshorn, NP     Brief Narrative:  [name] is a 71 y.o. female with medical history significant of HTN, pre-diabetes, hypothyroidism, NAFLD, Hx of right breast cancer s/p lumpectomy, adjuvant chemotherapy, radiation, herceptin maintenance and currently on antiestrogen therapy, persistent atrial fibrillation on Eliquis who presents with hypoxia.  Patient presented with chief complaints of wheezing, productive cough, difficulty sleeping, increasing lower extremity edema.  In the emergency department, she was requiring 3 L nasal cannula O2.  Chest x-ray with pulmonary congestion.  Patient was started on IV Lasix and admitted to the hospital.  Due to elevation in pCO2, patient was placed on BiPAP and admitted to stepdown unit.  Patient had improvement in mentation and was weaned off BiPAP.  New events last 24 hours / Subjective: Patient continuously monitored on CIWA protocol.  CIWA score less than 8 overnight, sounds like she was having some anxiety and tremors as well.  This morning, patient is on nasal cannula O2 without new complaints.   Assessment & Plan:   Principal Problem:   Acute respiratory failure with hypoxia and hypercapnia (HCC) Active Problems:   Hypothyroidism   Chronic back pain   Alcohol abuse   Benign essential HTN   Malignant neoplasm of upper-inner quadrant of right breast in female, estrogen receptor positive (HCC)   Hypokalemia   Morbid obesity (HCC)   Acute on chronic diastolic (congestive) heart failure (HCC)   Pulmonary edema   Acute hypoxemic and hypercarbic respiratory failure -SpO2 documented as low as 82% on 2L O2  -Due to her somnolence, ABG was obtained which revealed hypercarbia with pCO2 88, pO2 70  -COVID, influenza negative -CTA chest negative for PE -Now weaned off BiPAP  -Continue to wean off oxygen as able  Acute on chronic diastolic  heart failure -Strict I's and O's, daily weight -Echocardiogram revealed EF 60 to 65%, no regional wall motion abnormality, mild LVH -Continue IV Lasix, de-escalate to p.o. tomorrow.  Patient remains fluid overloaded but improved  A-fib -Coreg, Eliquis  Demand ischemia -Secondary to above, respiratory failure, heart failure -Troponin trend 45, 48  Hypertension -Coreg, Cozaar  Hypothyroidism -Synthroid  Alcohol abuse -CIWA protocol  Malignant neoplasm right breast -Status postlumpectomy, chemo and radiation -Followed by Dr. Lindi Adie -Anastrozole   Depression -Appreciate Psych eval -Started on Zoloft, increase to '50mg'$  12/3   Hypokalemia -Replace   DVT prophylaxis:  apixaban (ELIQUIS) tablet 5 mg  Code Status: Full Family Communication: No family at bedside.  Family were updated yesterday Disposition Plan:  Status is: Inpatient Remains inpatient appropriate because: IV Lasix   Antimicrobials:  Anti-infectives (From admission, onward)    None        Objective: Vitals:   11/20/22 2043 11/21/22 0500 11/21/22 0503 11/21/22 0609  BP: 129/76  136/63 (!) 149/69  Pulse: 72  76 73  Resp:   20   Temp:   98.4 F (36.9 C)   TempSrc:   Oral   SpO2:   97%   Weight:  113.7 kg    Height:        Intake/Output Summary (Last 24 hours) at 11/21/2022 1008 Last data filed at 11/20/2022 2015 Gross per 24 hour  Intake 360 ml  Output 550 ml  Net -190 ml    Filed Weights   11/18/22 1256 11/19/22 0620 11/21/22 0500  Weight: 120.2 kg 122.3 kg 113.7 kg   Examination: General exam:  Appears calm and comfortable  Respiratory system: Diminished breath sounds, no respiratory distress  Cardiovascular system: S1 & S2 heard, RRR. +Bilateral pedal edema. Gastrointestinal system: Abdomen is nondistended, soft and nontender. Normal bowel sounds heard. Central nervous system: Alert and oriented Extremities: Symmetric in appearance bilaterally  Skin: No rashes, lesions or ulcers  on exposed skin  Psychiatry: Judgement and insight appear poor.   Data Reviewed: I have personally reviewed following labs and imaging studies  CBC: Recent Labs  Lab 11/17/22 1516  WBC 6.8  NEUTROABS 4.6  HGB 14.3  HCT 45.4  MCV 105.8*  PLT 734    Basic Metabolic Panel: Recent Labs  Lab 11/17/22 1516 11/18/22 0446 11/19/22 0313 11/20/22 0313 11/21/22 0329  NA 145 145 144 145 145  K 2.8* 3.3* 3.0* 3.5 3.2*  CL 100 99 100 100 95*  CO2 36* 39* 37* 36* 42*  GLUCOSE 107* 141* 124* 127* 141*  BUN 27* 23 22 24* 28*  CREATININE 0.97 0.76 0.59 0.71 0.73  CALCIUM 8.8* 8.1* 8.2* 8.3* 8.6*  MG  --   --   --  1.9  --     GFR: Estimated Creatinine Clearance: 80.2 mL/min (by C-G formula based on SCr of 0.73 mg/dL). Liver Function Tests: Recent Labs  Lab 11/17/22 1516  AST 18  ALT 14  ALKPHOS 86  BILITOT 0.6  PROT 6.7  ALBUMIN 3.5    No results for input(s): "LIPASE", "AMYLASE" in the last 168 hours. No results for input(s): "AMMONIA" in the last 168 hours. Coagulation Profile: No results for input(s): "INR", "PROTIME" in the last 168 hours. Cardiac Enzymes: No results for input(s): "CKTOTAL", "CKMB", "CKMBINDEX", "TROPONINI" in the last 168 hours. BNP (last 3 results) No results for input(s): "PROBNP" in the last 8760 hours. HbA1C: No results for input(s): "HGBA1C" in the last 72 hours. CBG: No results for input(s): "GLUCAP" in the last 168 hours. Lipid Profile: No results for input(s): "CHOL", "HDL", "LDLCALC", "TRIG", "CHOLHDL", "LDLDIRECT" in the last 72 hours. Thyroid Function Tests: No results for input(s): "TSH", "T4TOTAL", "FREET4", "T3FREE", "THYROIDAB" in the last 72 hours. Anemia Panel: Recent Labs    11/19/22 1805  VITAMINB12 700  FOLATE 14.1    Sepsis Labs: No results for input(s): "PROCALCITON", "LATICACIDVEN" in the last 168 hours.  Recent Results (from the past 240 hour(s))  Resp Panel by RT-PCR (Flu A&B, Covid) Anterior Nasal Swab      Status: None   Collection Time: 11/17/22  3:16 PM   Specimen: Anterior Nasal Swab  Result Value Ref Range Status   SARS Coronavirus 2 by RT PCR NEGATIVE NEGATIVE Final    Comment: (NOTE) SARS-CoV-2 target nucleic acids are NOT DETECTED.  The SARS-CoV-2 RNA is generally detectable in upper respiratory specimens during the acute phase of infection. The lowest concentration of SARS-CoV-2 viral copies this assay can detect is 138 copies/mL. A negative result does not preclude SARS-Cov-2 infection and should not be used as the sole basis for treatment or other patient management decisions. A negative result may occur with  improper specimen collection/handling, submission of specimen other than nasopharyngeal swab, presence of viral mutation(s) within the areas targeted by this assay, and inadequate number of viral copies(<138 copies/mL). A negative result must be combined with clinical observations, patient history, and epidemiological information. The expected result is Negative.  Fact Sheet for Patients:  EntrepreneurPulse.com.au  Fact Sheet for Healthcare Providers:  IncredibleEmployment.be  This test is no t yet approved or cleared by the [place]  FDA and  has been authorized for detection and/or diagnosis of SARS-CoV-2 by FDA under an Emergency Use Authorization (EUA). This EUA will remain  in effect (meaning this test can be used) for the duration of the COVID-19 declaration under Section 564(b)(1) of the Act, 21 U.S.C.section 360bbb-3(b)(1), unless the authorization is terminated  or revoked sooner.       Influenza A by PCR NEGATIVE NEGATIVE Final   Influenza B by PCR NEGATIVE NEGATIVE Final    Comment: (NOTE) The Xpert Xpress SARS-CoV-2/FLU/RSV plus assay is intended as an aid in the diagnosis of influenza from Nasopharyngeal swab specimens and should not be used as a sole basis for treatment. Nasal washings and aspirates are  unacceptable for Xpert Xpress SARS-CoV-2/FLU/RSV testing.  Fact Sheet for Patients: EntrepreneurPulse.com.au  Fact Sheet for Healthcare Providers: IncredibleEmployment.be  This test is not yet approved or cleared by the [place] FDA and has been authorized for detection and/or diagnosis of SARS-CoV-2 by FDA under an Emergency Use Authorization (EUA). This EUA will remain in effect (meaning this test can be used) for the duration of the COVID-19 declaration under Section 564(b)(1) of the Act, 21 U.S.C. section 360bbb-3(b)(1), unless the authorization is terminated or revoked.  Performed at [facility], [place] [address] [place], [place] [address]   MRSA Next Gen by PCR, Nasal     Status: None   Collection Time: 11/19/22  6:42 AM   Specimen: Nasal Mucosa; Nasal Swab  Result Value Ref Range Status   MRSA by PCR Next Gen NOT DETECTED NOT DETECTED Final    Comment: (NOTE) The GeneXpert MRSA Assay (FDA approved for NASAL specimens only), is one component of a comprehensive MRSA colonization surveillance program. It is not intended to diagnose MRSA infection nor to guide or monitor treatment for MRSA infections. Test performance is not FDA approved in patients less than 71 years old. Performed at [facility], [place] [address], [place],  [address]       Radiology Studies: No results found.    Scheduled Meds:  anastrozole  1 mg Oral Daily   apixaban  5 mg Oral BID   carvedilol  12.5 mg Oral BID   folic acid  1 mg Oral Daily   furosemide  80 mg Intravenous BID   levothyroxine  175 mcg Oral QAC breakfast   losartan  50 mg Oral Daily   multivitamin with minerals  1 tablet Oral Daily   sertraline  50 mg Oral Daily   sodium chloride flush  3 mL Intravenous Q12H   thiamine  100 mg Oral Daily   Or   thiamine  100 mg Intravenous Daily   Continuous Infusions:  sodium chloride       LOS: 4  days     Dessa Phi, DO [facility] 11/21/2022, 10:08 AM   Available via Epic secure chat 7am-7pm After these hours, please refer to coverage provider listed on [email or web address]

## 2022-11-21 NOTE — Progress Notes (Signed)
RT checked on pt again and asked again if she is ready to wear her bipap. Pt refused. RN aware. No resp distress noted at this time. Machine remained bedside.

## 2022-11-21 NOTE — Progress Notes (Signed)
   11/21/22 0609  CIWA-Ar  BP (!) 149/69  Pulse Rate 73  Nausea and Vomiting 0  Tactile Disturbances 0  Tremor 2  Auditory Disturbances 0  Paroxysmal Sweats 0  Visual Disturbances 0  Anxiety 4  Headache, Fullness in Head 0  Agitation 1  Orientation and Clouding of Sensorium 1  CIWA-Ar Total 8   Pt is yelling for help, RN walk in the room, pt stated " I want my daddy, call my daddy." Per pt stated she is afraid to be abandon at hospital. RN try to calm down the pt and do the CIWA. Based on the CIWA score, 1 mg Ativan given.

## 2022-11-21 NOTE — Consult Note (Signed)
[facility] Face-to-Face Psychiatry Consult   Reason for Consult:  ''Depression and anxiety'' Referring Physician:  Dr. Maylene Roes Patient Identification: [name] MRN:  [id number] Principal Diagnosis: Acute respiratory failure with hypoxia and hypercapnia ([place]) Diagnosis:  Principal Problem:   Acute respiratory failure with hypoxia and hypercapnia ([place]) Active Problems:   Hypothyroidism   Chronic back pain   Alcohol abuse   Benign essential HTN   Malignant neoplasm of upper-inner quadrant of right breast in female, estrogen receptor positive ([place])   Hypokalemia   Morbid obesity ([place])   Acute on chronic diastolic (congestive) heart failure (H[facility])   Pulmonary edema   Generalized anxiety disorder   Alcohol abuse with alcohol-induced mood disorder ([place])   Alcohol abuse with alcohol-induced anxiety disorder ([place])   Total Time spent with patient: 45 minutes  Subjective:    ''I am still anxious, and depressed.''  Objective: Patient seen face to face in her hospital room, her husband of 45 years is present at her bedside. She was admitted due to anxiety, depression, anxiety, alcohol abuse, narcotic abuse, and  medication non compliance. Collateral information obtained from patient's husband revealed that patient uses alcohol to self treat anxiety and depression and he is concerned about his wife situations. He states that he has had a discussion with their daughter and all agreed that patient needs to be referral for alcohol and drug treatment before returning home. After a long conversation, patient agreed to clean up before she return home. She reports slight symptoms improvement with current medications regimen. She denies psychosis, delusions and self harming thoughts. Given the input by patient's family, I believe she will benefit from referral to inpatient drug and alcohol rehab facility for stabilization.    HPI: [name] is a 71 y.o. female with medical history significant of HTN,  pre-diabetes, hypothyroidism, NAFLD, Hx of right breast cancer s/p lumpectomy, adjuvant chemotherapy, radiation, herceptin maintenance and currently on antiestrogen therapy, persistent atrial fibrillation on Eliquis who presents with hypoxia.  Patient presented with chief complaints of wheezing, productive cough, difficulty sleeping, increasing lower extremity edema.  In the emergency department, she was requiring 3 L nasal cannula O2.  Chest x-ray with pulmonary congestion.  Patient was started on IV Lasix and admitted to the hospital.  Due to elevation in pCO2, patient was placed on BiPAP and admitted to stepdown unit.   Past Psychiatric History:substance abuse  Risk to Self:   Denies Risk to Others:  Denies Prior Inpatient Therapy:   Denies Prior Outpatient Therapy:   Denies  Past Medical History:  Past Medical History:  Diagnosis Date   Anxiety    Atrial fibrillation [facility])    sees Dr. Audie Box   Bilateral swelling of feet    Cancer [facility])    breast   Chronic diarrhea    Chronic pain    Concussion 02/2007   ICU x 3 days   Depression    Hypertension    Hypothyroidism    Personal history of chemotherapy    Personal history of radiation therapy    Spinal stenosis    Thyroid disease ?1994   Vitamin D deficiency     Past Surgical History:  Procedure Laterality Date   BREAST BIOPSY Right 12/20/2019   x2   BREAST LUMPECTOMY Right 01/22/2020   BREAST LUMPECTOMY WITH RADIOACTIVE SEED AND SENTINEL LYMPH NODE BIOPSY Right 01/22/2020   Procedure: RIGHT BREAST LUMPECTOMY WITH RADIOACTIVE SEED X2 AND RIGHT SENTINEL LYMPH NODE MAPPING;  Surgeon: Erroll Luna, MD;  Location:  [place];  Service: General;  Laterality: Right;   BREAST SURGERY Right 03/1998   breast biopsy, benign   EYE SURGERY Right    PORT-A-CATH REMOVAL Right 04/30/2021   Procedure: REMOVAL PORT-A-CATH;  Surgeon: Erroll Luna, MD;  Location: [place];  Service: General;  Laterality:  Right;   PORTACATH PLACEMENT Right 01/22/2020   Procedure: INSERTION PORT-A-CATH WITH ULTRASOUND;  Surgeon: Erroll Luna, MD;  Location: [place];  Service: General;  Laterality: Right;   PORTACATH PLACEMENT Right 02/28/2020   Procedure: PORT A CATH REVISION;  Surgeon: Erroll Luna, MD;  Location: [place];  Service: General;  Laterality: Right;   REVERSE SHOULDER ARTHROPLASTY Left 02/02/2021   Procedure: LEFT REVERSE SHOULDER ARTHROPLASTY;  Surgeon: Meredith Pel, MD;  Location: [place];  Service: Orthopedics;  Laterality: Left;   SHOULDER SURGERY Right    SPINAL FUSION  03/04/2011   with ORIF   Family History:  Family History  Problem Relation Age of Onset   Thyroid disease Mother    Dementia Mother    Depression Mother    Anxiety disorder Mother    Diabetes Father    Stroke Father    Alcoholism Father    Hypertension Sister    Diabetes Sister    Colon cancer Neg Hx    Colon polyps Neg Hx    Stomach cancer Neg Hx    Esophageal cancer Neg Hx    Family Psychiatric  History: Denies Social History:  Social History   Substance and Sexual Activity  Alcohol Use Yes   Alcohol/week: 5.0 standard drinks of alcohol   Types: 5 Standard drinks or equivalent per week   Comment: wine     Social History   Substance and Sexual Activity  Drug Use No    Social History   Socioeconomic History   Marital status: Married    Spouse name: Not on file   Number of children: 1   Years of education: Not on file   Highest education level: Not on file  Occupational History   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  Tobacco Use   Smoking status: Former    Years: 20.00    Types: Cigarettes    Quit date: 1980    Years since quitting: 43.9   Smokeless tobacco: Never  Vaping Use   Vaping Use: Never used  Substance and Sexual Activity   Alcohol use: Yes    Alcohol/week: 5.0 standard drinks of alcohol    Types: 5 Standard drinks or equivalent per week    Comment:  wine   Drug use: No   Sexual activity: Not Currently    Partners: Male    Birth control/protection: Post-menopausal  Other Topics Concern   Not on file  Social History Narrative   Not on file   Social Determinants of Health   Financial Resource Strain: Not on file  Food Insecurity: No Food Insecurity (11/19/2022)   Hunger Vital Sign    Worried About Running Out of Food in the Last Year: Never true    Ran Out of Food in the Last Year: Never true  Transportation Needs: No Transportation Needs (11/19/2022)   PRAPARE - Hydrologist (Medical): No    Lack of Transportation (Non-Medical): No  Physical Activity: Not on file  Stress: Not on file  Social Connections: Not on file   Additional Social History:    Allergies:   Allergies  Allergen Reactions   Zolpidem Tartrate  Other (See Comments)    Hallucinations and Confusion Other reaction(s): Hallucinations confusion     Labs:  Results for orders placed or performed during the hospital encounter of 11/17/22 (from the past 48 hour(s))  Vitamin B12     Status: None   Collection Time: 11/19/22  6:05 PM  Result Value Ref Range   Vitamin B-12 700 180 - 914 pg/mL    Comment: (NOTE) This assay is not validated for testing neonatal or myeloproliferative syndrome specimens for Vitamin B12 levels. Performed at [facility], [place] [address], [place], [place] [address]   Folate     Status: None   Collection Time: 11/19/22  6:05 PM  Result Value Ref Range   Folate 14.1 >5.9 ng/mL    Comment: Performed at [facility], [place] [address], [place], [place] [address]  Basic metabolic panel     Status: Abnormal   Collection Time: 11/20/22  3:13 AM  Result Value Ref Range   Sodium 145 135 - 145 mmol/L   Potassium 3.5 3.5 - 5.1 mmol/L   Chloride 100 98 - 111 mmol/L   CO2 36 (H) 22 - 32 mmol/L   Glucose, Bld 127 (H) 70 - 99 mg/dL    Comment: Glucose reference range applies  only to samples taken after fasting for at least 8 hours.   BUN 24 (H) 8 - 23 mg/dL   Creatinine, Ser 0.71 0.44 - 1.00 mg/dL   Calcium 8.3 (L) 8.9 - 10.3 mg/dL   GFR, Estimated >60 >60 mL/min    Comment: (NOTE) Calculated using the CKD-EPI Creatinine Equation (2021)    Anion gap 9 5 - 15    Comment: Performed at [facility], [place] [address], [place], [place] [address]  Magnesium     Status: None   Collection Time: 11/20/22  3:13 AM  Result Value Ref Range   Magnesium 1.9 1.7 - 2.4 mg/dL    Comment: Performed at [facility], [place] [address] [place], [place] [address]  Basic metabolic panel     Status: Abnormal   Collection Time: 11/21/22  3:29 AM  Result Value Ref Range   Sodium 145 135 - 145 mmol/L   Potassium 3.2 (L) 3.5 - 5.1 mmol/L   Chloride 95 (L) 98 - 111 mmol/L   CO2 42 (H) 22 - 32 mmol/L   Glucose, Bld 141 (H) 70 - 99 mg/dL    Comment: Glucose reference range applies only to samples taken after fasting for at least 8 hours.   BUN 28 (H) 8 - 23 mg/dL   Creatinine, Ser 0.73 0.44 - 1.00 mg/dL   Calcium 8.6 (L) 8.9 - 10.3 mg/dL   GFR, Estimated >60 >60 mL/min    Comment: (NOTE) Calculated using the CKD-EPI Creatinine Equation (2021)    Anion gap 8 5 - 15    Comment: Performed at [facility], [place] [address] [place], [place] [address]    Current Facility-Administered Medications  Medication Dose Route Frequency Provider Last Rate Last Admin   0.9 %  sodium chloride infusion  250 mL Intravenous PRN Tu, Ching T, DO       acetaminophen (TYLENOL) tablet 650 mg  650 mg Oral Q4H PRN Tu, Ching T, DO       anastrozole (ARIMIDEX) tablet 1 mg  1 mg Oral Daily Tu, Ching T, DO   1 mg at 11/21/22 1031   apixaban (ELIQUIS) tablet 5 mg  5 mg  Oral BID Tu, Ching T, DO   5 mg at 11/21/22 1026   carvedilol (COREG) tablet 12.5 mg  12.5 mg Oral BID Dessa Phi, DO   12.5 mg at 11/21/22 5956   diazepam (VALIUM) tablet 10 mg  10 mg  Oral QHS PRN Dessa Phi, DO   10 mg at 38/75/64 3329   folic acid (FOLVITE) tablet 1 mg  1 mg Oral Daily Tu, Ching T, DO   1 mg at 11/21/22 1026   furosemide (LASIX) injection 80 mg  80 mg Intravenous BID Dessa Phi, DO   80 mg at 11/21/22 5188   hydrALAZINE (APRESOLINE) injection 10 mg  10 mg Intravenous Q6H PRN Tu, Ching T, DO       levothyroxine (SYNTHROID) tablet 175 mcg  175 mcg Oral QAC breakfast Dessa Phi, DO   175 mcg at 11/21/22 0507   LORazepam (ATIVAN) tablet 1-4 mg  1-4 mg Oral Q1H PRN Dessa Phi, DO   2 mg at 11/21/22 1331   Or   LORazepam (ATIVAN) injection 1-4 mg  1-4 mg Intravenous Q1H PRN Dessa Phi, DO   2 mg at 11/20/22 0805   losartan (COZAAR) tablet 50 mg  50 mg Oral Daily Dessa Phi, DO   50 mg at 11/21/22 1026   multivitamin with minerals tablet 1 tablet  1 tablet Oral Daily Tu, Ching T, DO   1 tablet at 11/21/22 1026   ondansetron (ZOFRAN) injection 4 mg  4 mg Intravenous Q6H PRN Tu, Ching T, DO       Oral care mouth rinse  15 mL Mouth Rinse PRN Dessa Phi, DO       sertraline (ZOLOFT) tablet 50 mg  50 mg Oral Daily Suella Broad, FNP   50 mg at 11/21/22 1025   sodium chloride flush (NS) 0.9 % injection 3 mL  3 mL Intravenous Q12H Tu, Ching T, DO   3 mL at 11/21/22 1027   sodium chloride flush (NS) 0.9 % injection 3 mL  3 mL Intravenous PRN Tu, Ching T, DO       thiamine (VITAMIN B1) tablet 100 mg  100 mg Oral Daily Tu, Ching T, DO   100 mg at 11/21/22 1025   Or   thiamine (VITAMIN B1) injection 100 mg  100 mg Intravenous Daily Tu, Ching T, DO   100 mg at 11/19/22 1040    Musculoskeletal: Strength & Muscle Tone: within normal limits Gait & Station: normal Patient leans: N/A       Psychiatric Specialty Exam:  Presentation  General Appearance:  Appropriate for Environment; Casual (guarded)  Eye Contact: Fair  Speech: Clear and Coherent; Normal Rate  Speech Volume: Normal  Handedness: Right   Mood and Affect   Mood: Depressed; Anxious  Affect: Depressed   Thought Process  Thought Processes: Coherent; Linear  Descriptions of Associations:Intact  Orientation:Full (Time, Place and Person)  Thought Content:Logical  History of Schizophrenia/Schizoaffective disorder:No data recorded Duration of Psychotic Symptoms:No data recorded Hallucinations:No data recorded  Ideas of Reference:None  Suicidal Thoughts: none reported  Homicidal Thoughts: denies   Sensorium  Memory: Immediate Good; Recent Good; Remote Good  Judgment: Fair  Insight: Fair   Community education officer  Concentration: Fair  Attention Span: Good  Recall: Good  Fund of Knowledge: Good  Language: Good   Psychomotor Activity  Psychomotor Activity: No data recorded   Assets  Assets: Communication Skills; Desire for Improvement; Leisure Time; Physical Health; Resilience   Sleep  Sleep: No data  recorded   Physical Exam: Physical Exam Vitals and nursing note reviewed.  Constitutional:      Appearance: Normal appearance. She is obese.  HENT:     Head: Normocephalic.  Neurological:     General: No focal deficit present.     Mental Status: She is alert and oriented to person, place, and time. Mental status is at baseline.  Psychiatric:        Attention and Perception: Attention and perception normal.        Mood and Affect: Mood is anxious and depressed.        Speech: Speech normal.        Behavior: Behavior normal. Behavior is cooperative.        Thought Content: Thought content normal.        Cognition and Memory: Cognition and memory normal.        Judgment: Judgment normal.    Review of Systems  Psychiatric/Behavioral:  Positive for depression and substance abuse. The patient is nervous/anxious.    Blood pressure 138/85, pulse 72, temperature 98.5 F (36.9 C), temperature source Oral, resp. rate 18, height '5\' 6"'$  (1.676 m), weight 113.7 kg, SpO2 (!) 88 %. Body mass index is 40.46  kg/m.  Treatment Plan Summary: Daily contact with patient to assess and evaluate symptoms and progress in treatment, Medication management, and Plan    Endorses drinking  1-2 drinks daily. Denies any other recreational drug use.  Plan/Recommendations:  Continue CIWA protocol  Continue to monitor/Ativan detox  Folate, vitamin B12 and thiamine levels  Continue folic acid supplementation   Continue daily Thiamine 100 mg daily -Continue Synthroid tablet 12mg po daily -Consult TOC for placement in inpatient drug/alcohol rehabilitation facility.  -Increase  Zoloft from 25 mg to 50 mg po daily for depression/anxiety   Disposition:  -Recommend referral to drug/alcohol rehab facility agreed to by patient and family(Husband) after patient is medically cleared. -Supportive therapy provided about ongoing stressors. -Psychiatric service will follow this patient  MCorena Pilgrim MD 11/21/2022 2:02 PM

## 2022-11-22 DIAGNOSIS — J9601 Acute respiratory failure with hypoxia: Secondary | ICD-10-CM | POA: Diagnosis not present

## 2022-11-22 DIAGNOSIS — J9602 Acute respiratory failure with hypercapnia: Secondary | ICD-10-CM | POA: Diagnosis not present

## 2022-11-22 LAB — MAGNESIUM: Magnesium: 1.9 mg/dL (ref 1.7–2.4)

## 2022-11-22 LAB — BASIC METABOLIC PANEL
Anion gap: 10 (ref 5–15)
BUN: 25 mg/dL — ABNORMAL HIGH (ref 8–23)
CO2: 39 mmol/L — ABNORMAL HIGH (ref 22–32)
Calcium: 8.8 mg/dL — ABNORMAL LOW (ref 8.9–10.3)
Chloride: 96 mmol/L — ABNORMAL LOW (ref 98–111)
Creatinine, Ser: 0.76 mg/dL (ref 0.44–1.00)
GFR, Estimated: >60 mL/min (ref >60)
Glucose, Bld: 132 mg/dL — ABNORMAL HIGH (ref 70–99)
Potassium: 3.1 mmol/L — ABNORMAL LOW (ref 3.5–5.1)
Sodium: 145 mmol/L (ref 135–145)

## 2022-11-22 MED ORDER — THIAMINE HCL 100 MG/ML IJ SOLN
500.0000 mg | INTRAVENOUS | Status: AC
  Administered 2022-11-22 – 2022-11-24 (×3): 500 mg via INTRAVENOUS
  Filled 2022-11-22 (×3): qty 5

## 2022-11-22 MED ORDER — POTASSIUM CHLORIDE CRYS ER 20 MEQ PO TBCR
40.0000 meq | EXTENDED_RELEASE_TABLET | ORAL | Status: AC
  Administered 2022-11-22 (×2): 40 meq via ORAL
  Filled 2022-11-22 (×2): qty 2

## 2022-11-22 MED ORDER — LORAZEPAM 1 MG PO TABS
1.0000 mg | ORAL_TABLET | ORAL | Status: AC | PRN
  Administered 2022-11-24: 2 mg via ORAL
  Administered 2022-11-24 (×2): 1 mg via ORAL
  Administered 2022-11-25: 2 mg via ORAL
  Administered 2022-11-25: 1 mg via ORAL
  Administered 2022-11-25: 2 mg via ORAL
  Filled 2022-11-22 (×2): qty 1
  Filled 2022-11-22: qty 2
  Filled 2022-11-22: qty 1
  Filled 2022-11-22 (×2): qty 2

## 2022-11-22 MED ORDER — HALOPERIDOL LACTATE 5 MG/ML IJ SOLN
2.0000 mg | Freq: Three times a day (TID) | INTRAMUSCULAR | Status: DC | PRN
  Administered 2022-11-22: 2 mg via INTRAVENOUS
  Filled 2022-11-22: qty 1

## 2022-11-22 MED ORDER — LORAZEPAM 2 MG/ML IJ SOLN
1.0000 mg | INTRAMUSCULAR | Status: AC | PRN
  Administered 2022-11-22 – 2022-11-23 (×2): 4 mg via INTRAVENOUS
  Administered 2022-11-23: 2 mg via INTRAVENOUS
  Administered 2022-11-23: 4 mg via INTRAVENOUS
  Administered 2022-11-23: 2 mg via INTRAVENOUS
  Filled 2022-11-22: qty 2
  Filled 2022-11-22: qty 1
  Filled 2022-11-22: qty 2
  Filled 2022-11-22 (×2): qty 1
  Filled 2022-11-22: qty 2

## 2022-11-22 MED ORDER — FUROSEMIDE 40 MG PO TABS
40.0000 mg | ORAL_TABLET | Freq: Every day | ORAL | Status: DC
  Administered 2022-11-22 – 2022-11-27 (×6): 40 mg via ORAL
  Filled 2022-11-22 (×6): qty 1

## 2022-11-22 MED ORDER — HALOPERIDOL LACTATE 5 MG/ML IJ SOLN
5.0000 mg | Freq: Three times a day (TID) | INTRAMUSCULAR | Status: DC | PRN
  Administered 2022-11-22: 3 mg via INTRAVENOUS
  Administered 2022-11-23 – 2022-11-25 (×2): 5 mg via INTRAVENOUS
  Filled 2022-11-22 (×3): qty 1

## 2022-11-22 NOTE — TOC Progression Note (Signed)
Transition of Care [facility]) - Progression Note    Patient Details  Name: [name] MRN: [id number] Date of Birth: 09-30-1951  Transition of Care [facility]) CM/SW Contact  Leeroy Cha, RN Phone Number: 11/22/2022, 9:28 AM  Clinical Narrative:       Expected Discharge Plan: Home/Self Care Barriers to Discharge: Continued Medical Work up  Expected Discharge Plan and Services Expected Discharge Plan: Home/Self Care   Discharge Planning Services: CM Consult   Living arrangements for the past 2 months: Single Family Home                                       Social Determinants of Health (SDOH) Interventions    Readmission Risk Interventions   No data to display

## 2022-11-22 NOTE — Progress Notes (Signed)
This RN went to grab medication to medicate patient, although now pt appears to be resting soundly.  Will hold off on medication and re-evaluate when pt awakens.    11/22/22 2033  CIWA-Ar  BP (!) 151/90  Pulse Rate 74  Nausea and Vomiting 0  Tactile Disturbances 0  Tremor 3  Auditory Disturbances 2  Paroxysmal Sweats 1  Visual Disturbances 0  Anxiety 3  Headache, Fullness in Head 0  Agitation 3  Orientation and Clouding of Sensorium 1  CIWA-Ar Total 13

## 2022-11-22 NOTE — Progress Notes (Signed)
PROGRESS NOTE    Isabel KOFOED  [name]:[id number] DOB: April 09, 1951 DOA: 11/17/2022 PCP: Bridget Hartshorn, NP     Brief Narrative:  [name] is a 71 y.o. female with medical history significant of HTN, pre-diabetes, hypothyroidism, NAFLD, Hx of right breast cancer s/p lumpectomy, adjuvant chemotherapy, radiation, herceptin maintenance and currently on antiestrogen therapy, persistent atrial fibrillation on Eliquis who presents with hypoxia.  Patient presented with chief complaints of wheezing, productive cough, difficulty sleeping, increasing lower extremity edema.  In the emergency department, she was requiring 3 L nasal cannula O2.  Chest x-ray with pulmonary congestion.  Patient was started on IV Lasix and admitted to the hospital.  Due to elevation in pCO2, patient was placed on BiPAP and admitted to stepdown unit.  Patient had improvement in mentation and was weaned off BiPAP.  New events last 24 hours / Subjective: Patient going through alcohol withdrawal at this point.  Latest CIWA score was documented 16.  On exam patient is anxious, confused.  Assessment & Plan:   Principal Problem:   Acute respiratory failure with hypoxia and hypercapnia (HCC) Active Problems:   Hypothyroidism   Chronic back pain   Alcohol abuse   Benign essential HTN   Malignant neoplasm of upper-inner quadrant of right breast in female, estrogen receptor positive (HCC)   Hypokalemia   Morbid obesity (HCC)   Acute on chronic diastolic (congestive) heart failure (HCC)   Pulmonary edema   Generalized anxiety disorder   Alcohol abuse with alcohol-induced mood disorder (HCC)   Alcohol abuse with alcohol-induced anxiety disorder (HCC)   Acute hypoxemic and hypercarbic respiratory failure -SpO2 documented as low as 82% on 2L O2  -Due to her somnolence, ABG was obtained which revealed hypercarbia with pCO2 88, pO2 70  -COVID, influenza negative -CTA chest negative for PE -Now weaned off BiPAP   -Continue to wean off oxygen as able  Acute on chronic diastolic heart failure -Strict I's and O's, daily weight -Echocardiogram revealed EF 60 to 65%, no regional wall motion abnormality, mild LVH -Improved, transition to p.o. Lasix  Alcohol abuse -CIWA protocol  A-fib -Coreg, Eliquis  Demand ischemia -Secondary to above, respiratory failure, heart failure -Troponin trend 45, 48  Hypertension -Coreg, Cozaar  Hypothyroidism -Synthroid  Malignant neoplasm right breast -Status postlumpectomy, chemo and radiation -Followed by Dr. Lindi Adie -Anastrozole   Depression -Appreciate Psych eval -Started on Zoloft  Hypokalemia -Replace   DVT prophylaxis:  apixaban (ELIQUIS) tablet 5 mg  Code Status: Full Family Communication: No family at bedside Disposition Plan:  Status is: Inpatient Remains inpatient appropriate because: Alcohol withdrawal   Antimicrobials:  Anti-infectives (From admission, onward)    None        Objective: Vitals:   11/22/22 0151 11/22/22 0343 11/22/22 0358 11/22/22 0816  BP: (!) 140/75 (!) 151/86    Pulse: 83 84    Resp:  (!) 24    Temp:  98 F (36.7 C)    TempSrc:  Oral    SpO2: 94% 94%  94%  Weight:   115.3 kg   Height:        Intake/Output Summary (Last 24 hours) at 11/22/2022 1054 Last data filed at 11/21/2022 1849 Gross per 24 hour  Intake --  Output 750 ml  Net -750 ml    Filed Weights   11/19/22 0620 11/21/22 0500 11/22/22 0358  Weight: 122.3 kg 113.7 kg 115.3 kg   Examination: General exam: Appears confused, agitated Respiratory system: Diminished breath sounds, no respiratory  distress, on 1 L oxygen Cardiovascular system: S1 & S2 heard, RRR. minimal  pedal edema. Gastrointestinal system: Abdomen is nondistended, soft and nontender. Normal bowel sounds heard. Central nervous system: Alert  Extremities: Symmetric in appearance bilaterally  Skin: No rashes, lesions or ulcers on exposed skin  Psychiatry: Judgement  and insight appear poor.   Data Reviewed: I have personally reviewed following labs and imaging studies  CBC: Recent Labs  Lab 11/17/22 1516  WBC 6.8  NEUTROABS 4.6  HGB 14.3  HCT 45.4  MCV 105.8*  PLT 409    Basic Metabolic Panel: Recent Labs  Lab 11/18/22 0446 11/19/22 0313 11/20/22 0313 11/21/22 0329 11/22/22 0332  NA 145 144 145 145 145  K 3.3* 3.0* 3.5 3.2* 3.1*  CL 99 100 100 95* 96*  CO2 39* 37* 36* 42* 39*  GLUCOSE 141* 124* 127* 141* 132*  BUN 23 22 24* 28* 25*  CREATININE 0.76 0.59 0.71 0.73 0.76  CALCIUM 8.1* 8.2* 8.3* 8.6* 8.8*  MG  --   --  1.9  --  1.9    GFR: Estimated Creatinine Clearance: 80.8 mL/min (by C-G formula based on SCr of 0.76 mg/dL). Liver Function Tests: Recent Labs  Lab 11/17/22 1516  AST 18  ALT 14  ALKPHOS 86  BILITOT 0.6  PROT 6.7  ALBUMIN 3.5    No results for input(s): "LIPASE", "AMYLASE" in the last 168 hours. No results for input(s): "AMMONIA" in the last 168 hours. Coagulation Profile: No results for input(s): "INR", "PROTIME" in the last 168 hours. Cardiac Enzymes: No results for input(s): "CKTOTAL", "CKMB", "CKMBINDEX", "TROPONINI" in the last 168 hours. BNP (last 3 results) No results for input(s): "PROBNP" in the last 8760 hours. HbA1C: No results for input(s): "HGBA1C" in the last 72 hours. CBG: No results for input(s): "GLUCAP" in the last 168 hours. Lipid Profile: No results for input(s): "CHOL", "HDL", "LDLCALC", "TRIG", "CHOLHDL", "LDLDIRECT" in the last 72 hours. Thyroid Function Tests: No results for input(s): "TSH", "T4TOTAL", "FREET4", "T3FREE", "THYROIDAB" in the last 72 hours. Anemia Panel: Recent Labs    11/19/22 1805  VITAMINB12 700  FOLATE 14.1    Sepsis Labs: No results for input(s): "PROCALCITON", "LATICACIDVEN" in the last 168 hours.  Recent Results (from the past 240 hour(s))  Resp Panel by RT-PCR (Flu A&B, Covid) Anterior Nasal Swab     Status: None   Collection Time: 11/17/22   3:16 PM   Specimen: Anterior Nasal Swab  Result Value Ref Range Status   SARS Coronavirus 2 by RT PCR NEGATIVE NEGATIVE Final    Comment: (NOTE) SARS-CoV-2 target nucleic acids are NOT DETECTED.  The SARS-CoV-2 RNA is generally detectable in upper respiratory specimens during the acute phase of infection. The lowest concentration of SARS-CoV-2 viral copies this assay can detect is 138 copies/mL. A negative result does not preclude SARS-Cov-2 infection and should not be used as the sole basis for treatment or other patient management decisions. A negative result may occur with  improper specimen collection/handling, submission of specimen other than nasopharyngeal swab, presence of viral mutation(s) within the areas targeted by this assay, and inadequate number of viral copies(<138 copies/mL). A negative result must be combined with clinical observations, patient history, and epidemiological information. The expected result is Negative.  Fact Sheet for Patients:  EntrepreneurPulse.com.au  Fact Sheet for Healthcare Providers:  IncredibleEmployment.be  This test is no t yet approved or cleared by the [place] FDA and  has been authorized for detection and/or diagnosis of  SARS-CoV-2 by FDA under an Emergency Use Authorization (EUA). This EUA will remain  in effect (meaning this test can be used) for the duration of the COVID-19 declaration under Section 564(b)(1) of the Act, 21 U.S.C.section 360bbb-3(b)(1), unless the authorization is terminated  or revoked sooner.       Influenza A by PCR NEGATIVE NEGATIVE Final   Influenza B by PCR NEGATIVE NEGATIVE Final    Comment: (NOTE) The Xpert Xpress SARS-CoV-2/FLU/RSV plus assay is intended as an aid in the diagnosis of influenza from Nasopharyngeal swab specimens and should not be used as a sole basis for treatment. Nasal washings and aspirates are unacceptable for Xpert Xpress  SARS-CoV-2/FLU/RSV testing.  Fact Sheet for Patients: EntrepreneurPulse.com.au  Fact Sheet for Healthcare Providers: IncredibleEmployment.be  This test is not yet approved or cleared by the [place] FDA and has been authorized for detection and/or diagnosis of SARS-CoV-2 by FDA under an Emergency Use Authorization (EUA). This EUA will remain in effect (meaning this test can be used) for the duration of the COVID-19 declaration under Section 564(b)(1) of the Act, 21 U.S.C. section 360bbb-3(b)(1), unless the authorization is terminated or revoked.  Performed at [facility], [place] [address], [place], [place] [address]   MRSA Next Gen by PCR, Nasal     Status: None   Collection Time: 11/19/22  6:42 AM   Specimen: Nasal Mucosa; Nasal Swab  Result Value Ref Range Status   MRSA by PCR Next Gen NOT DETECTED NOT DETECTED Final    Comment: (NOTE) The GeneXpert MRSA Assay (FDA approved for NASAL specimens only), is one component of a comprehensive MRSA colonization surveillance program. It is not intended to diagnose MRSA infection nor to guide or monitor treatment for MRSA infections. Test performance is not FDA approved in patients less than 66 years old. Performed at [facility], [place] [address] [place], [place] [address]       Radiology Studies: No results found.    Scheduled Meds:  anastrozole  1 mg Oral Daily   apixaban  5 mg Oral BID   carvedilol  12.5 mg Oral BID   folic acid  1 mg Oral Daily   furosemide  40 mg Oral Daily   levothyroxine  175 mcg Oral QAC breakfast   losartan  50 mg Oral Daily   multivitamin with minerals  1 tablet Oral Daily   potassium chloride  40 mEq Oral Q4H   sertraline  50 mg Oral Daily   sodium chloride flush  3 mL Intravenous Q12H   Continuous Infusions:  sodium chloride     thiamine (VITAMIN B1) injection       LOS: 5 days     Dessa Phi, DO [facility] 11/22/2022, 10:54 AM   Available via Epic secure chat 7am-7pm After these hours, please refer to coverage provider listed on [email or web address]

## 2022-11-22 NOTE — Consult Note (Cosign Needed Addendum)
[facility] Face-to-Face Psychiatry Consult   Reason for Consult:  ''Depression and anxiety'' Referring Physician:  Dr. Maylene Roes Patient Identification: [name] MRN:  [id number] Principal Diagnosis: Acute respiratory failure with hypoxia and hypercapnia ([place]) Diagnosis:  Principal Problem:   Acute respiratory failure with hypoxia and hypercapnia ([place]) Active Problems:   Hypothyroidism   Chronic back pain   Alcohol abuse   Benign essential HTN   Malignant neoplasm of upper-inner quadrant of right breast in female, estrogen receptor positive ([place])   Hypokalemia   Morbid obesity ([place])   Acute on chronic diastolic (congestive) heart failure ([place])   Pulmonary edema   Generalized anxiety disorder   Alcohol abuse with alcohol-induced mood disorder ([place])   Alcohol abuse with alcohol-induced anxiety disorder ([place])   Total Time spent with patient: 45 minutes  Subjective:    ''I gotta get up out of here. I have to go to work. Let me out of here. Where is [name]? [name]? [name] will help me!''  Objective: Patient seen face to face in her hospital room, her husband of 45 years is present at her bedside. She was admitted due to anxiety, depression, anxiety, alcohol abuse, narcotic abuse, and  medication non compliance.   On reassessment today patient did present with some acute changes in mental status.  She does fluctuate throughout this psychiatric reassessment, and at times requiring repetition of multiple questions and prompts in order to get an appropriate response from patient.  CAM-ICU was also performed-in which patient scored 2 out of 3 with deficits and disorganized thinking and inattention.  Patient not willing to complete save-a haart as she states " I have to go work. Get me out of here."   Patient recently received psychotropic medication prior to this assessment, however per nursing report and husband report patient did develop confusion yesterday, change in altered mental status, difficulty  to redirect, agitation, shakiness, hallucinations, and multiple attempts to get out of bed despite redirection.  On today's examination patient does appear to be resting peacefully, until awaken by this provider and make attempts to get up out of bed.  Patient denies any severe alcohol complications, however denies any period of sobriety.  Patient does deny any suicidal ideations or homicidal ideations.  She also denies any psychotic symptoms at this time, yet is heard calling for [name] and reporting having to go to work despite being in the hospital. Patient is reminded that she is admitted to the hospital and does not have to go, in addition to [name] is not here  On examination he does present with any acute psychiatric symptoms, and she did recently receive medication to manage her psychosis, agitation, and confusion.  His current presentation of altered mental status, confusion, with psychosis is most consistent with prolonged delirium tremens.  She meets criteria for delirium based on waxing and waning altered mental status in the setting of recent alcohol withdrawal. This is day 5 of admission, she has received. She has no reported psychiatric history and has never been on any psychiatric medication with the exception of alcohol deterrents.    While she is within the time window to have a alcohol hallucinosis in the setting of alcohol withdrawal, I believe that this remains likely given her clinical picture with abnormal vital signs and presentation of withdrawal symptoms this morning. Her clinical picture again indicates that she is being adequately treated with appropriate medication regimen and will make adjustments see below.     HPI: [name] is a  71 y.o. female with medical history significant of HTN, pre-diabetes, hypothyroidism, NAFLD, Hx of right breast cancer s/p lumpectomy, adjuvant chemotherapy, radiation, herceptin maintenance and currently on antiestrogen therapy, persistent atrial  fibrillation on Eliquis who presents with hypoxia.  Patient presented with chief complaints of wheezing, productive cough, difficulty sleeping, increasing lower extremity edema.  In the emergency department, she was requiring 3 L nasal cannula O2.  Chest x-ray with pulmonary congestion.  Patient was started on IV Lasix and admitted to the hospital.  Due to elevation in pCO2, patient was placed on BiPAP and admitted to stepdown unit.   Past Psychiatric History:Substance abuse  Risk to Self:   Denies Risk to Others:  Denies Prior Inpatient Therapy:   Denies Prior Outpatient Therapy:   Denies  Past Medical History:  Past Medical History:  Diagnosis Date   Anxiety    Atrial fibrillation [facility])    sees Dr. Audie Box   Bilateral swelling of feet    Cancer [facility])    breast   Chronic diarrhea    Chronic pain    Concussion 02/2007   ICU x 3 days   Depression    Hypertension    Hypothyroidism    Personal history of chemotherapy    Personal history of radiation therapy    Spinal stenosis    Thyroid disease ?1994   Vitamin D deficiency     Past Surgical History:  Procedure Laterality Date   BREAST BIOPSY Right 12/20/2019   x2   BREAST LUMPECTOMY Right 01/22/2020   BREAST LUMPECTOMY WITH RADIOACTIVE SEED AND SENTINEL LYMPH NODE BIOPSY Right 01/22/2020   Procedure: RIGHT BREAST LUMPECTOMY WITH RADIOACTIVE SEED X2 AND RIGHT SENTINEL LYMPH NODE MAPPING;  Surgeon: Erroll Luna, MD;  Location: [place];  Service: General;  Laterality: Right;   BREAST SURGERY Right 03/1998   breast biopsy, benign   EYE SURGERY Right    PORT-A-CATH REMOVAL Right 04/30/2021   Procedure: REMOVAL PORT-A-CATH;  Surgeon: Erroll Luna, MD;  Location: [place];  Service: General;  Laterality: Right;   PORTACATH PLACEMENT Right 01/22/2020   Procedure: INSERTION PORT-A-CATH WITH ULTRASOUND;  Surgeon: Erroll Luna, MD;  Location: [place];  Service: General;   Laterality: Right;   PORTACATH PLACEMENT Right 02/28/2020   Procedure: PORT A CATH REVISION;  Surgeon: Erroll Luna, MD;  Location: [place];  Service: General;  Laterality: Right;   REVERSE SHOULDER ARTHROPLASTY Left 02/02/2021   Procedure: LEFT REVERSE SHOULDER ARTHROPLASTY;  Surgeon: Meredith Pel, MD;  Location: [place];  Service: Orthopedics;  Laterality: Left;   SHOULDER SURGERY Right    SPINAL FUSION  03/04/2011   with ORIF   Family History:  Family History  Problem Relation Age of Onset   Thyroid disease Mother    Dementia Mother    Depression Mother    Anxiety disorder Mother    Diabetes Father    Stroke Father    Alcoholism Father    Hypertension Sister    Diabetes Sister    Colon cancer Neg Hx    Colon polyps Neg Hx    Stomach cancer Neg Hx    Esophageal cancer Neg Hx    Family Psychiatric  History: Denies Social History:  Social History   Substance and Sexual Activity  Alcohol Use Yes   Alcohol/week: 5.0 standard drinks of alcohol   Types: 5 Standard drinks or equivalent per week   Comment: wine     Social History   Substance and  Sexual Activity  Drug Use No    Social History   Socioeconomic History   Marital status: Married    Spouse name: Not on file   Number of children: 1   Years of education: Not on file   Highest education level: Not on file  Occupational History   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  Tobacco Use   Smoking status: Former    Years: 20.00    Types: Cigarettes    Quit date: 1980    Years since quitting: 43.9   Smokeless tobacco: Never  Vaping Use   Vaping Use: Never used  Substance and Sexual Activity   Alcohol use: Yes    Alcohol/week: 5.0 standard drinks of alcohol    Types: 5 Standard drinks or equivalent per week    Comment: wine   Drug use: No   Sexual activity: Not Currently    Partners: Male    Birth control/protection: Post-menopausal  Other Topics Concern   Not on file  Social History Narrative   Not  on file   Social Determinants of Health   Financial Resource Strain: Not on file  Food Insecurity: No Food Insecurity (11/19/2022)   Hunger Vital Sign    Worried About Running Out of Food in the Last Year: Never true    Ran Out of Food in the Last Year: Never true  Transportation Needs: No Transportation Needs (11/19/2022)   PRAPARE - Hydrologist (Medical): No    Lack of Transportation (Non-Medical): No  Physical Activity: Not on file  Stress: Not on file  Social Connections: Not on file   Additional Social History:    Allergies:   Allergies  Allergen Reactions   Zolpidem Tartrate Other (See Comments)    Hallucinations and Confusion Other reaction(s): Hallucinations confusion     Labs:  Results for orders placed or performed during the hospital encounter of 11/17/22 (from the past 48 hour(s))  Basic metabolic panel     Status: Abnormal   Collection Time: 11/21/22  3:29 AM  Result Value Ref Range   Sodium 145 135 - 145 mmol/L   Potassium 3.2 (L) 3.5 - 5.1 mmol/L   Chloride 95 (L) 98 - 111 mmol/L   CO2 42 (H) 22 - 32 mmol/L   Glucose, Bld 141 (H) 70 - 99 mg/dL    Comment: Glucose reference range applies only to samples taken after fasting for at least 8 hours.   BUN 28 (H) 8 - 23 mg/dL   Creatinine, Ser 0.73 0.44 - 1.00 mg/dL   Calcium 8.6 (L) 8.9 - 10.3 mg/dL   GFR, Estimated >60 >60 mL/min    Comment: (NOTE) Calculated using the CKD-EPI Creatinine Equation (2021)    Anion gap 8 5 - 15    Comment: Performed at [facility], [place] [address] [place], [place] [address]  Basic metabolic panel     Status: Abnormal   Collection Time: 11/22/22  3:32 AM  Result Value Ref Range   Sodium 145 135 - 145 mmol/L   Potassium 3.1 (L) 3.5 - 5.1 mmol/L   Chloride 96 (L) 98 - 111 mmol/L   CO2 39 (H) 22 - 32 mmol/L   Glucose, Bld 132 (H) 70 - 99 mg/dL    Comment: Glucose reference range applies only to samples taken after fasting for  at least 8 hours.   BUN 25 (H) 8 - 23 mg/dL   Creatinine, Ser 0.76 0.44 - 1.00 mg/dL  Calcium 8.8 (L) 8.9 - 10.3 mg/dL   GFR, Estimated >60 >60 mL/min    Comment: (NOTE) Calculated using the CKD-EPI Creatinine Equation (2021)    Anion gap 10 5 - 15    Comment: Performed at [facility], [place] [address], [place], [place] [address]  Magnesium     Status: None   Collection Time: 11/22/22  3:32 AM  Result Value Ref Range   Magnesium 1.9 1.7 - 2.4 mg/dL    Comment: Performed at [facility], [place] [address] [place],  [address]    Current Facility-Administered Medications  Medication Dose Route Frequency Provider Last Rate Last Admin   0.9 %  sodium chloride infusion  250 mL Intravenous PRN Tu, Ching T, DO       acetaminophen (TYLENOL) tablet 650 mg  650 mg Oral Q4H PRN Tu, Ching T, DO       anastrozole (ARIMIDEX) tablet 1 mg  1 mg Oral Daily Tu, Ching T, DO   1 mg at 11/22/22 1244   apixaban (ELIQUIS) tablet 5 mg  5 mg Oral BID Tu, Ching T, DO   5 mg at 11/22/22 1245   carvedilol (COREG) tablet 12.5 mg  12.5 mg Oral BID Dessa Phi, DO   12.5 mg at 11/22/22 1244   diazepam (VALIUM) tablet 10 mg  10 mg Oral QHS PRN Dessa Phi, DO   10 mg at 62/13/08 6578   folic acid (FOLVITE) tablet 1 mg  1 mg Oral Daily Tu, Ching T, DO   1 mg at 11/22/22 1245   furosemide (LASIX) tablet 40 mg  40 mg Oral Daily Dessa Phi, DO   40 mg at 11/22/22 1244   hydrALAZINE (APRESOLINE) injection 10 mg  10 mg Intravenous Q6H PRN Tu, Ching T, DO       levothyroxine (SYNTHROID) tablet 175 mcg  175 mcg Oral QAC breakfast Dessa Phi, DO   175 mcg at 11/22/22 4696   losartan (COZAAR) tablet 50 mg  50 mg Oral Daily Dessa Phi, DO   50 mg at 11/22/22 1245   multivitamin with minerals tablet 1 tablet  1 tablet Oral Daily Tu, Ching T, DO   1 tablet at 11/22/22 1245   ondansetron (ZOFRAN) injection 4 mg  4 mg Intravenous Q6H PRN Tu, Ching T, DO       Oral care  mouth rinse  15 mL Mouth Rinse PRN Dessa Phi, DO       potassium chloride SA (KLOR-CON M) CR tablet 40 mEq  40 mEq Oral Q4H Dessa Phi, DO   40 mEq at 11/22/22 1244   sertraline (ZOLOFT) tablet 50 mg  50 mg Oral Daily Suella Broad, FNP   50 mg at 11/22/22 1245   sodium chloride flush (NS) 0.9 % injection 3 mL  3 mL Intravenous Q12H Tu, Ching T, DO   3 mL at 11/22/22 1246   sodium chloride flush (NS) 0.9 % injection 3 mL  3 mL Intravenous PRN Tu, Ching T, DO       thiamine (VITAMIN B1) 500 mg in normal saline (50 mL) IVPB  500 mg Intravenous Q24H Suella Broad, FNP        Musculoskeletal: Strength & Muscle Tone: within normal limits Gait & Station: normal Patient leans: N/A       Psychiatric Specialty Exam:  Presentation  General Appearance:  Appropriate for Environment; Disheveled  Eye Contact: Fair  Speech: Pressured  Speech Volume: Normal  Handedness:  Right   Mood and Affect  Mood: Labile; Anxious; Irritable; Angry  Affect: Inappropriate   Thought Process  Thought Processes: Coherent; Linear  Descriptions of Associations:Intact  Orientation:Full (Time, Place and Person)  Thought Content:Scattered; Tangential; Paranoid Ideation  History of Schizophrenia/Schizoaffective disorder:No Duration of Psychotic Symptoms:Less than six months Hallucinations:Hallucinations: None  Ideas of Reference:Delusions; Paranoia  Suicidal Thoughts: none reported  Homicidal Thoughts: denies   Sensorium  Memory: Immediate Poor; Recent Fair; Remote Poor  Judgment: Poor  Insight: Poor   Executive Functions  Concentration: Fair  Attention Span: Poor  Recall: Poor  Fund of Knowledge: Poor  Language: Fair   Psychomotor Activity  Psychomotor Activity: Psychomotor Activity: Normal   Assets  Assets: Armed forces logistics/support/administrative officer; Desire for Improvement; Leisure Time; Physical Health; Resilience   Sleep  Sleep: Sleep:  Fair   Physical Exam: Physical Exam Vitals and nursing note reviewed.  Constitutional:      Appearance: Normal appearance. She is obese.  HENT:     Head: Normocephalic.  Musculoskeletal:        General: Normal range of motion.  Skin:    General: Skin is warm.     Capillary Refill: Capillary refill takes less than 2 seconds.  Neurological:     General: No focal deficit present.     Mental Status: She is alert and oriented to person, place, and time. Mental status is at baseline.  Psychiatric:        Attention and Perception: Attention and perception normal.        Mood and Affect: Mood normal.        Speech: Speech normal.        Behavior: Behavior normal. Behavior is cooperative.        Thought Content: Thought content normal.        Cognition and Memory: Cognition and memory normal.        Judgment: Judgment normal.    Review of Systems  Psychiatric/Behavioral:  Positive for depression and substance abuse. The patient is nervous/anxious.    Blood pressure (!) 143/66, pulse 74, temperature 98.2 F (36.8 C), temperature source Oral, resp. rate 20, height '5\' 6"'$  (1.676 m), weight 115.3 kg, SpO2 96 %. Body mass index is 41.38 kg/m.  71 year old female with absent psychiatric history, undiagnosed psychiatric history, who presents on today's evaluation with symptoms consistent with delirium tremens, complication from alcohol withdrawal.  We have provided recommendations throughout course of admission, and will continue to monitor.  See below for current recommendations and ongoing management.    Treatment Plan Summary: Daily contact with patient to assess and evaluate symptoms and progress in treatment, Medication management, and Plan    Endorses drinking  1-2 drinks daily. Denies any other recreational drug use.  Plan/Recommendations:  Continue CIWA protocol  Continue to monitor/Ativan detox  Folate, vitamin B12; Thiamine levels pending. Thiamine 500 mg IV x 3 days.   Continue folic acid supplementation   1:1 safety sitter -Will obtain EKG, QTc 462  Will start Haldol prn agitation, psychosis and delusions.  Initial 2 mg dose, ineffective will increase to Haldol 5 mg IV push every 8 hours as needed. -Continue Synthroid tablet 138mg po daily -Consult TOC for placement in inpatient drug/alcohol rehabilitation facility.  -Continue Zoloft 50 mg po daily for depression/anxiety  Disposition:  -Recommend referral to drug/alcohol rehab facility agreed to by patient and family (Husband) after patient is medically cleared. -Supportive therapy provided about ongoing stressors. -Psychiatric service will follow this patient.   TSuella Broad  FNP 11/22/2022 1:25 PM

## 2022-11-22 NOTE — TOC Progression Note (Signed)
Transition of Care [facility]) - Progression Note    Patient Details  Name: [name] MRN: [id number] Date of Birth: 03-26-1951  Transition of Care [facility]) CM/SW Contact  Leeroy Cha, RN Phone Number: 11/22/2022, 9:32 AM  Clinical Narrative:    [id number]/spoke with the daughter who is saying that patient is hallucinating and is being combative.  Not sure if in withdrawal or is there an organic brain problems.  Informed her at this point that is hard to say and that psych is due to come and re-evaluate her mother today.  She seemed to understand and will wait on the md to make a decision.  States that pt can not come home in the state that she is in.  Told her that we would wait and see what happens.   Expected Discharge Plan: Home/Self Care Barriers to Discharge: Continued Medical Work up  Expected Discharge Plan and Services Expected Discharge Plan: Home/Self Care   Discharge Planning Services: CM Consult   Living arrangements for the past 2 months: Single Family Home                                       Social Determinants of Health (SDOH) Interventions    Readmission Risk Interventions   No data to display

## 2022-11-22 NOTE — Progress Notes (Signed)
The patient  is withdrawing from alcohol and the Ativan seems not helping her.  Very impulsive and setting the bed alarm off trying to go find her husband. She keeps on saying the husband might be cheating on her of something is wrong with him,  and she needs to go find him. She's not redirectable. I'm afraid she's going to fall.  Notified Olena Heckle NP and awaiting for a new response.

## 2022-11-22 NOTE — Progress Notes (Signed)
PT refusing bipap for tonight

## 2022-11-23 DIAGNOSIS — J9601 Acute respiratory failure with hypoxia: Secondary | ICD-10-CM | POA: Diagnosis not present

## 2022-11-23 DIAGNOSIS — J9602 Acute respiratory failure with hypercapnia: Secondary | ICD-10-CM | POA: Diagnosis not present

## 2022-11-23 LAB — BASIC METABOLIC PANEL
Anion gap: 7 (ref 5–15)
BUN: 22 mg/dL (ref 8–23)
CO2: 37 mmol/L — ABNORMAL HIGH (ref 22–32)
Calcium: 8.7 mg/dL — ABNORMAL LOW (ref 8.9–10.3)
Chloride: 101 mmol/L (ref 98–111)
Creatinine, Ser: 0.65 mg/dL (ref 0.44–1.00)
GFR, Estimated: >60 mL/min (ref >60)
Glucose, Bld: 125 mg/dL — ABNORMAL HIGH (ref 70–99)
Potassium: 4 mmol/L (ref 3.5–5.1)
Sodium: 145 mmol/L (ref 135–145)

## 2022-11-23 MED ORDER — FENTANYL CITRATE PF 50 MCG/ML IJ SOSY
12.5000 ug | PREFILLED_SYRINGE | INTRAMUSCULAR | Status: DC | PRN
  Administered 2022-11-23 (×2): 12.5 ug via INTRAVENOUS
  Filled 2022-11-23 (×2): qty 1

## 2022-11-23 NOTE — Progress Notes (Signed)
PT Cancellation Note  Patient Details Name: [name] MRN: [id number] DOB: [birth year]   Cancelled Treatment:    Cancelled treatment due to patient on 8 mg of ativan IV, 5 mg of haldol, and 25 cg fentanyl and is still agitated. Will see patient another day as schedule allows.    Allyson Sabal 11/23/2022, 2:01 PM

## 2022-11-23 NOTE — Progress Notes (Signed)
Pt slept most of night, arousable.  Pt had episode of agitation at beginning of shift, but after getting warm blankets, pt fell back to sleep, please see previous documention regarding.   Pt this morning was also agitated, medicated per [facility] and CIWA protocol.  Pt awake and requesting "more medication" for anxiety.  Pt unable to verbalize reason for being here, states "I have a hole in my head", but otherwise knows she's at [facility] and that it is December.  No other issues arose overnight.

## 2022-11-23 NOTE — Consult Note (Addendum)
[facility] Face-to-Face Psychiatry Consult   Reason for Consult:  ''Depression and anxiety'' Referring Physician:  Dr. Maylene Roes Patient Identification: [name] MRN:  [id number] Principal Diagnosis: Acute respiratory failure with hypoxia and hypercapnia ([place]) Diagnosis:  Principal Problem:   Acute respiratory failure with hypoxia and hypercapnia ([place]) Active Problems:   Hypothyroidism   Chronic back pain   Alcohol abuse   Benign essential HTN   Malignant neoplasm of upper-inner quadrant of right breast in female, estrogen receptor positive ([place])   Hypokalemia   Morbid obesity ([place])   Acute on chronic diastolic (congestive) heart failure ([place])   Pulmonary edema   Generalized anxiety disorder   Alcohol abuse with alcohol-induced mood disorder ([place])   Alcohol abuse with alcohol-induced anxiety disorder ([place])   Total Time spent with patient: 45 minutes  Subjective:    ''I am not going to any inpatient facility.  I do not want to be here now.''  Objective: Patient seen face to face in her hospital room, her husband of 45 years is present at her bedside. She was admitted due to anxiety, depression, anxiety, alcohol abuse, narcotic abuse, and  medication non compliance.  On today's reassessment patient continues to present with acute changes in mental status.  She is alert and oriented to self only.  She is able to perform small calculations and understand basic concepts (such as world backwards, months of the year).  She continues to remain very irritable, agitated, delusional and hallucinating at times.  After much redirection from husband and this provider, was able to have a conversation with patient to discuss our current recommendations, current clinical presentation, and treatment.  Patient continues to object to inpatient rehabilitation.  Did discuss with patient her suicidal statement in which she reference yesterday " just give me a gun so I can shoot myself."  Patient does admit to  making such a statement, however denies any true intent.  Did discuss with patient her current coping skills of catastrophizing and gas lighting, or negative behaviors that can be improved through use of rehabilitation and therapy.  Also discussed with patient her history and longevity of alcohol abuse, current clinical presentation warrants inpatient rehabilitation as she may not be successful discharging from the hospital for acute medical withdrawal to outpatient services.  She continues to lack insight and judgment at this time, however does agree to consider inpatient rehabilitation upon discharge.  At 1 point during the interview she does become tearful when discussing her family, grandchildren, need for change, and alcohol use " tearing down the family.  We just want the old you back."  Patient does continue to deny suicidal ideations, homicidal ideations, and or auditory or visual hallucinations.  During this reassessment she makes no attempt to get out of bed, is not combative and or disruptive; although she is notably irritable and agitated she was able to participate in a conversation.   His current presentation of altered mental status, confusion, with psychosis is most consistent with prolonged delirium tremens.  She meets criteria for delirium based on waxing and waning altered mental status in the setting of recent alcohol withdrawal. This is day 6 of admission, she has received 12 mg of Ativan as of this note. She has no reported psychiatric history and has never been on any psychiatric medication with the exception of alcohol deterrents.    Husband was present today at bedside, able to provide psychoeducation in addition to obtaining collateral information that is pertinent for appropriate disposition.  As  patient mention, he is unable to quantify her actual alcohol intake and or longevity due to patient's "closet drinking".  He reports the patient has 1 previous hospitalization many years ago  over 49.  He denies any additional psychiatric history, outpatient services with the exception of alcohol use disorder severe.  He does report at times patient will go out to the store and 8 and 9 AM in the morning, and return with several bottles of alcohol.  He further reports while folding laundry and or assisting patient with activities, he would find multiple empty liquor bottles.  He further reports they have been married for 45 years, and he wants her to get better.  He is not willing to take her home in this shape, and or without appropriate treatment services in place."  I do not know what I would do without her.  But I know I cannot continue to go on like this with the shape that she is in.  Something has to give."   HPI: [name] is a 71 y.o. female with medical history significant of HTN, pre-diabetes, hypothyroidism, NAFLD, Hx of right breast cancer s/p lumpectomy, adjuvant chemotherapy, radiation, herceptin maintenance and currently on antiestrogen therapy, persistent atrial fibrillation on Eliquis who presents with hypoxia.  Patient presented with chief complaints of wheezing, productive cough, difficulty sleeping, increasing lower extremity edema.  In the emergency department, she was requiring 3 L nasal cannula O2.  Chest x-ray with pulmonary congestion.  Patient was started on IV Lasix and admitted to the hospital.  Due to elevation in pCO2, patient was placed on BiPAP and admitted to stepdown unit.   Past Psychiatric History:Substance abuse  Risk to Self:   Denies Risk to Others:  Denies Prior Inpatient Therapy:   Denies Prior Outpatient Therapy:   Denies  Past Medical History:  Past Medical History:  Diagnosis Date   Anxiety    Atrial fibrillation [facility])    sees Dr. Audie Box   Bilateral swelling of feet    Cancer [facility])    breast   Chronic diarrhea    Chronic pain    Concussion 02/2007   ICU x 3 days   Depression    Hypertension    Hypothyroidism    Personal history  of chemotherapy    Personal history of radiation therapy    Spinal stenosis    Thyroid disease ?1994   Vitamin D deficiency     Past Surgical History:  Procedure Laterality Date   BREAST BIOPSY Right 12/20/2019   x2   BREAST LUMPECTOMY Right 01/22/2020   BREAST LUMPECTOMY WITH RADIOACTIVE SEED AND SENTINEL LYMPH NODE BIOPSY Right 01/22/2020   Procedure: RIGHT BREAST LUMPECTOMY WITH RADIOACTIVE SEED X2 AND RIGHT SENTINEL LYMPH NODE MAPPING;  Surgeon: Erroll Luna, MD;  Location: [place];  Service: General;  Laterality: Right;   BREAST SURGERY Right 03/1998   breast biopsy, benign   EYE SURGERY Right    PORT-A-CATH REMOVAL Right 04/30/2021   Procedure: REMOVAL PORT-A-CATH;  Surgeon: Erroll Luna, MD;  Location: [place];  Service: General;  Laterality: Right;   PORTACATH PLACEMENT Right 01/22/2020   Procedure: INSERTION PORT-A-CATH WITH ULTRASOUND;  Surgeon: Erroll Luna, MD;  Location: [place];  Service: General;  Laterality: Right;   PORTACATH PLACEMENT Right 02/28/2020   Procedure: PORT A CATH REVISION;  Surgeon: Erroll Luna, MD;  Location: [place];  Service: General;  Laterality: Right;   REVERSE SHOULDER ARTHROPLASTY Left 02/02/2021  Procedure: LEFT REVERSE SHOULDER ARTHROPLASTY;  Surgeon: Meredith Pel, MD;  Location: [place];  Service: Orthopedics;  Laterality: Left;   SHOULDER SURGERY Right    SPINAL FUSION  03/04/2011   with ORIF   Family History:  Family History  Problem Relation Age of Onset   Thyroid disease Mother    Dementia Mother    Depression Mother    Anxiety disorder Mother    Diabetes Father    Stroke Father    Alcoholism Father    Hypertension Sister    Diabetes Sister    Colon cancer Neg Hx    Colon polyps Neg Hx    Stomach cancer Neg Hx    Esophageal cancer Neg Hx    Family Psychiatric  History: Denies Social History:  Social History   Substance and Sexual Activity  Alcohol Use  Yes   Alcohol/week: 5.0 standard drinks of alcohol   Types: 5 Standard drinks or equivalent per week   Comment: wine     Social History   Substance and Sexual Activity  Drug Use No    Social History   Socioeconomic History   Marital status: Married    Spouse name: Not on file   Number of children: 1   Years of education: Not on file   Highest education level: Not on file  Occupational History   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  Tobacco Use   Smoking status: Former    Years: 20.00    Types: Cigarettes    Quit date: 1980    Years since quitting: 43.9   Smokeless tobacco: Never  Vaping Use   Vaping Use: Never used  Substance and Sexual Activity   Alcohol use: Yes    Alcohol/week: 5.0 standard drinks of alcohol    Types: 5 Standard drinks or equivalent per week    Comment: wine   Drug use: No   Sexual activity: Not Currently    Partners: Male    Birth control/protection: Post-menopausal  Other Topics Concern   Not on file  Social History Narrative   Not on file   Social Determinants of Health   Financial Resource Strain: Not on file  Food Insecurity: No Food Insecurity (11/19/2022)   Hunger Vital Sign    Worried About Running Out of Food in the Last Year: Never true    Ran Out of Food in the Last Year: Never true  Transportation Needs: No Transportation Needs (11/19/2022)   PRAPARE - Hydrologist (Medical): No    Lack of Transportation (Non-Medical): No  Physical Activity: Not on file  Stress: Not on file  Social Connections: Not on file   Additional Social History:    Allergies:   Allergies  Allergen Reactions   Zolpidem Tartrate Other (See Comments)    Hallucinations and Confusion Other reaction(s): Hallucinations confusion     Labs:  Results for orders placed or performed during the hospital encounter of 11/17/22 (from the past 48 hour(s))  Basic metabolic panel     Status: Abnormal   Collection Time: 11/22/22  3:32  AM  Result Value Ref Range   Sodium 145 135 - 145 mmol/L   Potassium 3.1 (L) 3.5 - 5.1 mmol/L   Chloride 96 (L) 98 - 111 mmol/L   CO2 39 (H) 22 - 32 mmol/L   Glucose, Bld 132 (H) 70 - 99 mg/dL    Comment: Glucose reference range applies only to samples taken after fasting for at least 8  hours.   BUN 25 (H) 8 - 23 mg/dL   Creatinine, Ser 0.76 0.44 - 1.00 mg/dL   Calcium 8.8 (L) 8.9 - 10.3 mg/dL   GFR, Estimated >60 >60 mL/min    Comment: (NOTE) Calculated using the CKD-EPI Creatinine Equation (2021)    Anion gap 10 5 - 15    Comment: Performed at [facility], [place] [address], [place], [place] [address]  Magnesium     Status: None   Collection Time: 11/22/22  3:32 AM  Result Value Ref Range   Magnesium 1.9 1.7 - 2.4 mg/dL    Comment: Performed at [facility], [place] [address], [place], [place] [address]  Basic metabolic panel     Status: Abnormal   Collection Time: 11/23/22  4:20 AM  Result Value Ref Range   Sodium 145 135 - 145 mmol/L   Potassium 4.0 3.5 - 5.1 mmol/L   Chloride 101 98 - 111 mmol/L   CO2 37 (H) 22 - 32 mmol/L   Glucose, Bld 125 (H) 70 - 99 mg/dL    Comment: Glucose reference range applies only to samples taken after fasting for at least 8 hours.   BUN 22 8 - 23 mg/dL   Creatinine, Ser 0.65 0.44 - 1.00 mg/dL   Calcium 8.7 (L) 8.9 - 10.3 mg/dL   GFR, Estimated >60 >60 mL/min    Comment: (NOTE) Calculated using the CKD-EPI Creatinine Equation (2021)    Anion gap 7 5 - 15    Comment: Performed at [facility], [place] [address], [place], [place] [address]    Current Facility-Administered Medications  Medication Dose Route Frequency Provider Last Rate Last Admin   0.9 %  sodium chloride infusion  250 mL Intravenous PRN Tu, Ching T, DO       acetaminophen (TYLENOL) tablet 650 mg  650 mg Oral Q4H PRN Tu, Ching T, DO       anastrozole (ARIMIDEX) tablet 1 mg  1 mg Oral Daily Tu, Ching T, DO   1 mg at 11/23/22  2355   apixaban (ELIQUIS) tablet 5 mg  5 mg Oral BID Tu, Ching T, DO   5 mg at 11/23/22 7322   carvedilol (COREG) tablet 12.5 mg  12.5 mg Oral BID Dessa Phi, DO   12.5 mg at 11/23/22 0749   diazepam (VALIUM) tablet 10 mg  10 mg Oral QHS PRN Dessa Phi, DO   10 mg at 11/21/22 2103   fentaNYL (SUBLIMAZE) injection 12.5 mcg  12.5 mcg Intravenous Q2H PRN Dessa Phi, DO   12.5 mcg at 02/54/27 0623   folic acid (FOLVITE) tablet 1 mg  1 mg Oral Daily Tu, Ching T, DO   1 mg at 11/23/22 7628   furosemide (LASIX) tablet 40 mg  40 mg Oral Daily Dessa Phi, DO   40 mg at 11/23/22 3151   haloperidol lactate (HALDOL) injection 5 mg  5 mg Intravenous Q8H PRN Suella Broad, FNP   5 mg at 11/23/22 0840   hydrALAZINE (APRESOLINE) injection 10 mg  10 mg Intravenous Q6H PRN Tu, Ching T, DO       levothyroxine (SYNTHROID) tablet 175 mcg  175 mcg Oral QAC breakfast Dessa Phi, DO   175 mcg at 11/22/22 0508   LORazepam (ATIVAN) tablet 1-4 mg  1-4 mg Oral Q1H PRN Dessa Phi, DO       Or   LORazepam (ATIVAN) injection 1-4 mg  1-4 mg Intravenous Q1H PRN Maylene Roes,  Anderson [place], DO   2 mg at 11/23/22 1508   losartan (COZAAR) tablet 50 mg  50 mg Oral Daily Dessa Phi, DO   50 mg at 11/23/22 8341   multivitamin with minerals tablet 1 tablet  1 tablet Oral Daily Tu, Ching T, DO   1 tablet at 11/23/22 0927   ondansetron (ZOFRAN) injection 4 mg  4 mg Intravenous Q6H PRN Tu, Ching T, DO       Oral care mouth rinse  15 mL Mouth Rinse PRN Dessa Phi, DO       sertraline (ZOLOFT) tablet 50 mg  50 mg Oral Daily Suella Broad, FNP   50 mg at 11/23/22 9622   sodium chloride flush (NS) 0.9 % injection 3 mL  3 mL Intravenous Q12H Tu, Ching T, DO   3 mL at 11/23/22 2979   sodium chloride flush (NS) 0.9 % injection 3 mL  3 mL Intravenous PRN Tu, Ching T, DO       thiamine (VITAMIN B1) 500 mg in normal saline (50 mL) IVPB  500 mg Intravenous Q24H Suella Broad, FNP 100 mL/hr at 11/23/22  1510 500 mg at 11/23/22 1510    Musculoskeletal: Strength & Muscle Tone: within normal limits Gait & Station: normal Patient leans: N/A       Psychiatric Specialty Exam:  Presentation  General Appearance:  Appropriate for Environment; Disheveled  Eye Contact: Fair  Speech: Pressured  Speech Volume: Normal  Handedness: Right   Mood and Affect  Mood: Labile; Anxious; Irritable; Angry  Affect: Inappropriate   Thought Process  Thought Processes: Coherent; Linear  Descriptions of Associations:Intact  Orientation:Full (Time, Place and Person)  Thought Content:Scattered; Tangential; Paranoid Ideation  History of Schizophrenia/Schizoaffective disorder:No Duration of Psychotic Symptoms:Less than six months Hallucinations:Hallucinations: None  Ideas of Reference:Delusions; Paranoia  Suicidal Thoughts: none reported  Homicidal Thoughts: denies   Sensorium  Memory: Immediate Poor; Recent Fair; Remote Poor  Judgment: Poor  Insight: Poor   Executive Functions  Concentration: Fair  Attention Span: Poor  Recall: Poor  Fund of Knowledge: Poor  Language: Fair   Psychomotor Activity  Psychomotor Activity: Psychomotor Activity: Normal   Assets  Assets: Armed forces logistics/support/administrative officer; Desire for Improvement; Leisure Time; Physical Health; Resilience   Sleep  Sleep: Sleep: Fair   Physical Exam: Physical Exam Vitals and nursing note reviewed.  Constitutional:      Appearance: Normal appearance. She is obese.  HENT:     Head: Normocephalic.  Musculoskeletal:        General: Normal range of motion.  Skin:    General: Skin is warm.     Capillary Refill: Capillary refill takes less than 2 seconds.  Neurological:     General: No focal deficit present.     Mental Status: She is alert and oriented to person, place, and time. Mental status is at baseline.  Psychiatric:        Attention and Perception: Attention and perception normal.         Mood and Affect: Mood normal.        Speech: Speech normal.        Behavior: Behavior normal. Behavior is cooperative.        Thought Content: Thought content normal.        Cognition and Memory: Cognition and memory normal.        Judgment: Judgment normal.    Review of Systems  Psychiatric/Behavioral:  Positive for depression and substance abuse. The patient is nervous/anxious.  Blood pressure (!) 164/87, pulse 85, temperature 98.2 F (36.8 C), temperature source Oral, resp. rate 18, height '5\' 6"'$  (1.676 m), weight 115.3 kg, SpO2 98 %. Body mass index is 41.78 kg/m.  71 year old female with absent psychiatric history, undiagnosed psychiatric history, who presents on today's evaluation with symptoms consistent with delirium tremens, complication from alcohol withdrawal.  We have provided recommendations throughout course of admission, and will continue to monitor.  See below for current recommendations and ongoing management.    Treatment Plan Summary: Daily contact with patient to assess and evaluate symptoms and progress in treatment, Medication management, and Plan    Plan/Recommendations:  Continue CIWA protocol  Continue to monitor/Ativan detox  Folate, vitamin B12; Thiamine levels pending. Thiamine 500 mg IV x 3 days.  Continue folic acid supplementation   1:1 safety sitter -Will obtain EKG, QTc 462  Will start Haldol prn agitation, psychosis and delusions.  Initial 2 mg dose, ineffective will increase to Haldol 5 mg IV push every 8 hours as needed. -Continue Synthroid tablet 127mg po daily -Consult TOC for placement in inpatient drug/alcohol rehabilitation facility.  -Continue Zoloft 50 mg po daily for depression/anxiety  Disposition:  -Recommend referral to drug/alcohol rehab facility agreed to by patient and family (Husband) after patient is medically cleared. -Supportive therapy provided about ongoing stressors. -Psychiatric service will follow this patient.    TSuella Broad FNP 11/23/2022 4:25 PM

## 2022-11-23 NOTE — Progress Notes (Signed)
PROGRESS NOTE    [name]  VOH:[id number] DOB: [birth year] DOA: 11/17/2022 PCP: Bridget Hartshorn, NP     Brief Narrative:  [name] is a 71 y.o. female with medical history significant of HTN, pre-diabetes, hypothyroidism, NAFLD, Hx of right breast cancer s/p lumpectomy, adjuvant chemotherapy, radiation, herceptin maintenance and currently on antiestrogen therapy, persistent atrial fibrillation on Eliquis who presents with hypoxia.  Patient presented with chief complaints of wheezing, productive cough, difficulty sleeping, increasing lower extremity edema.  In the emergency department, she was requiring 3 L nasal cannula O2.  Chest x-ray with pulmonary congestion.  Patient was started on IV Lasix and admitted to the hospital.  Due to elevation in pCO2, patient was placed on BiPAP and admitted to stepdown unit.  Patient had improvement in mentation and was weaned off BiPAP. Hospitalization further complicated by alcohol withdrawal.   New events last 24 hours / Subjective: Patient continues to require IV Ativan and Haldol for alcohol withdrawal.  This morning, she is oriented to self, place but not to year.  She is very agitated, wants to go home and for [place] to contact her parents and siblings.  Assessment & Plan:   Principal Problem:   Acute respiratory failure with hypoxia and hypercapnia (HCC) Active Problems:   Hypothyroidism   Chronic back pain   Alcohol abuse   Benign essential HTN   Malignant neoplasm of upper-inner quadrant of right breast in female, estrogen receptor positive (HCC)   Hypokalemia   Morbid obesity (HCC)   Acute on chronic diastolic (congestive) heart failure (HCC)   Pulmonary edema   Generalized anxiety disorder   Alcohol abuse with alcohol-induced mood disorder (HCC)   Alcohol abuse with alcohol-induced anxiety disorder (HCC)   Acute hypoxemic and hypercarbic respiratory failure -SpO2 documented as low as 82% on 2L O2  -Due to her  somnolence, ABG was obtained which revealed hypercarbia with pCO2 88, pO2 70  -COVID, influenza negative -CTA chest negative for PE -Now weaned off BiPAP  -Continue to wean off oxygen as able  Acute on chronic diastolic heart failure -Strict I's and O's, daily weight -Echocardiogram revealed EF 60 to 65%, no regional wall motion abnormality, mild LVH -Improved, now on p.o. Lasix  Alcohol abuse -CIWA protocol  A-fib -Coreg, Eliquis  Demand ischemia -Secondary to above, respiratory failure, heart failure -Troponin trend 45, 48  Hypertension -Coreg, Cozaar  Hypothyroidism -Synthroid  Malignant neoplasm right breast -Status postlumpectomy, chemo and radiation -Followed by Dr. Lindi Adie -Anastrozole   Depression -Appreciate Psych eval -Started on Zoloft    DVT prophylaxis:  apixaban (ELIQUIS) tablet 5 mg  Code Status: Full Family Communication: No family at bedside Disposition Plan:  Status is: Inpatient Remains inpatient appropriate because: Alcohol withdrawal   Antimicrobials:  Anti-infectives (From admission, onward)    None        Objective: Vitals:   11/22/22 2033 11/23/22 0630 11/23/22 0730 11/23/22 0830  BP: (!) 151/90 (!) 167/95 (!) 148/89 (!) 144/78  Pulse: 74 72 70   Resp: (!) '22 18 20   '$ Temp: 98.1 F (36.7 C) 98.6 F (37 C)    TempSrc: Axillary Oral    SpO2: 98% 98% 97%   Weight:      Height:        Intake/Output Summary (Last 24 hours) at 11/23/2022 1025 Last data filed at 11/23/2022 0630 Gross per 24 hour  Intake 0 ml  Output 300 ml  Net -300 ml    [employer]  11/19/22 0620 11/21/22 0500 11/22/22 0358  Weight: 122.3 kg 113.7 kg 115.3 kg   Examination: General exam: Appears agitated Respiratory system: Diminished breath sounds, no respiratory distress Cardiovascular system: S1 & S2 heard, RRR.  No pedal edema. Gastrointestinal system: Abdomen is nondistended, soft and nontender. Normal bowel sounds heard. Central nervous  system: Alert  Extremities: Symmetric in appearance bilaterally  Skin: No rashes, lesions or ulcers on exposed skin  Psychiatry: Judgement and insight appear poor.   Data Reviewed: I have personally reviewed following labs and imaging studies  CBC: Recent Labs  Lab 11/17/22 1516  WBC 6.8  NEUTROABS 4.6  HGB 14.3  HCT 45.4  MCV 105.8*  PLT 409    Basic Metabolic Panel: Recent Labs  Lab 11/19/22 0313 11/20/22 0313 11/21/22 0329 11/22/22 0332 11/23/22 0420  NA 144 145 145 145 145  K 3.0* 3.5 3.2* 3.1* 4.0  CL 100 100 95* 96* 101  CO2 37* 36* 42* 39* 37*  GLUCOSE 124* 127* 141* 132* 125*  BUN 22 24* 28* 25* 22  CREATININE 0.59 0.71 0.73 0.76 0.65  CALCIUM 8.2* 8.3* 8.6* 8.8* 8.7*  MG  --  1.9  --  1.9  --     GFR: Estimated Creatinine Clearance: 80.8 mL/min (by C-G formula based on SCr of 0.65 mg/dL). Liver Function Tests: Recent Labs  Lab 11/17/22 1516  AST 18  ALT 14  ALKPHOS 86  BILITOT 0.6  PROT 6.7  ALBUMIN 3.5    No results for input(s): "LIPASE", "AMYLASE" in the last 168 hours. No results for input(s): "AMMONIA" in the last 168 hours. Coagulation Profile: No results for input(s): "INR", "PROTIME" in the last 168 hours. Cardiac Enzymes: No results for input(s): "CKTOTAL", "CKMB", "CKMBINDEX", "TROPONINI" in the last 168 hours. BNP (last 3 results) No results for input(s): "PROBNP" in the last 8760 hours. HbA1C: No results for input(s): "HGBA1C" in the last 72 hours. CBG: No results for input(s): "GLUCAP" in the last 168 hours. Lipid Profile: No results for input(s): "CHOL", "HDL", "LDLCALC", "TRIG", "CHOLHDL", "LDLDIRECT" in the last 72 hours. Thyroid Function Tests: No results for input(s): "TSH", "T4TOTAL", "FREET4", "T3FREE", "THYROIDAB" in the last 72 hours. Anemia Panel: No results for input(s): "VITAMINB12", "FOLATE", "FERRITIN", "TIBC", "IRON", "RETICCTPCT" in the last 72 hours.  Sepsis Labs: No results for input(s): "PROCALCITON",  "LATICACIDVEN" in the last 168 hours.  Recent Results (from the past 240 hour(s))  Resp Panel by RT-PCR (Flu A&B, Covid) Anterior Nasal Swab     Status: None   Collection Time: 11/17/22  3:16 PM   Specimen: Anterior Nasal Swab  Result Value Ref Range Status   SARS Coronavirus 2 by RT PCR NEGATIVE NEGATIVE Final    Comment: (NOTE) SARS-CoV-2 target nucleic acids are NOT DETECTED.  The SARS-CoV-2 RNA is generally detectable in upper respiratory specimens during the acute phase of infection. The lowest concentration of SARS-CoV-2 viral copies this assay can detect is 138 copies/mL. A negative result does not preclude SARS-Cov-2 infection and should not be used as the sole basis for treatment or other patient management decisions. A negative result may occur with  improper specimen collection/handling, submission of specimen other than nasopharyngeal swab, presence of viral mutation(s) within the areas targeted by this assay, and inadequate number of viral copies(<138 copies/mL). A negative result must be combined with clinical observations, patient history, and epidemiological information. The expected result is Negative.  Fact Sheet for Patients:  EntrepreneurPulse.com.au  Fact Sheet for Healthcare Providers:  IncredibleEmployment.be  This test is no t yet approved or cleared by the [place] and  has been authorized for detection and/or diagnosis of SARS-CoV-2 by FDA under an Emergency Use Authorization (EUA). This EUA will remain  in effect (meaning this test can be used) for the duration of the COVID-19 declaration under Section 564(b)(1) of the Act, 21 U.S.C.section 360bbb-3(b)(1), unless the authorization is terminated  or revoked sooner.       Influenza A by PCR NEGATIVE NEGATIVE Final   Influenza B by PCR NEGATIVE NEGATIVE Final    Comment: (NOTE) The Xpert Xpress SARS-CoV-2/FLU/RSV plus assay is intended as an aid in the  diagnosis of influenza from Nasopharyngeal swab specimens and should not be used as a sole basis for treatment. Nasal washings and aspirates are unacceptable for Xpert Xpress SARS-CoV-2/FLU/RSV testing.  Fact Sheet for Patients: EntrepreneurPulse.com.au  Fact Sheet for Healthcare Providers: IncredibleEmployment.be  This test is not yet approved or cleared by the [place] FDA and has been authorized for detection and/or diagnosis of SARS-CoV-2 by FDA under an Emergency Use Authorization (EUA). This EUA will remain in effect (meaning this test can be used) for the duration of the COVID-19 declaration under Section 564(b)(1) of the Act, 21 U.S.C. section 360bbb-3(b)(1), unless the authorization is terminated or revoked.  Performed at [facility], [place] [address] [place], [place] [address]   MRSA Next Gen by PCR, Nasal     Status: None   Collection Time: 11/19/22  6:42 AM   Specimen: Nasal Mucosa; Nasal Swab  Result Value Ref Range Status   MRSA by PCR Next Gen NOT DETECTED NOT DETECTED Final    Comment: (NOTE) The GeneXpert MRSA Assay (FDA approved for NASAL specimens only), is one component of a comprehensive MRSA colonization surveillance program. It is not intended to diagnose MRSA infection nor to guide or monitor treatment for MRSA infections. Test performance is not FDA approved in patients less than 71 years old. Performed at [facility], [place] [address], [place], [place] [address]       Radiology Studies: No results found.    Scheduled Meds:  anastrozole  1 mg Oral Daily   apixaban  5 mg Oral BID   carvedilol  12.5 mg Oral BID   folic acid  1 mg Oral Daily   furosemide  40 mg Oral Daily   levothyroxine  175 mcg Oral QAC breakfast   losartan  50 mg Oral Daily   multivitamin with minerals  1 tablet Oral Daily   sertraline  50 mg Oral Daily   sodium chloride flush  3 mL Intravenous  Q12H   Continuous Infusions:  sodium chloride     thiamine (VITAMIN B1) injection 500 mg (11/22/22 1656)     LOS: 6 days     Dessa Phi, DO [facility] 11/23/2022, 10:25 AM   Available via Epic secure chat 7am-7pm After these hours, please refer to coverage provider listed on [email or web address]

## 2022-11-23 NOTE — Progress Notes (Signed)
OT Cancellation Note  Patient Details Name: [name] MRN: [id number] DOB: February 01, 1951   Cancelled Treatment:    Reason Eval/Treat Not Completed: Medical issues which prohibited therapy Patient is in room with nurse and family. Nurse asking for therapy to hold off at this time. OT to continue to follow and check back as schedule will allow.  Rennie Plowman, MS Acute Rehabilitation Department Office# [phone]  11/23/2022, 12:04 PM

## 2022-11-23 NOTE — Plan of Care (Signed)
Patient anxious and asking if she is going home tomorrow.  Remains very confused and

## 2022-11-24 ENCOUNTER — Ambulatory Visit: Payer: Medicare PPO | Admitting: Physical Therapy

## 2022-11-24 DIAGNOSIS — J9601 Acute respiratory failure with hypoxia: Secondary | ICD-10-CM | POA: Diagnosis not present

## 2022-11-24 DIAGNOSIS — J9602 Acute respiratory failure with hypercapnia: Secondary | ICD-10-CM | POA: Diagnosis not present

## 2022-11-24 LAB — BASIC METABOLIC PANEL
Anion gap: 9 (ref 5–15)
BUN: 23 mg/dL (ref 8–23)
CO2: 36 mmol/L — ABNORMAL HIGH (ref 22–32)
Calcium: 9 mg/dL (ref 8.9–10.3)
Chloride: 99 mmol/L (ref 98–111)
Creatinine, Ser: 0.68 mg/dL (ref 0.44–1.00)
GFR, Estimated: >60 mL/min (ref >60)
Glucose, Bld: 122 mg/dL — ABNORMAL HIGH (ref 70–99)
Potassium: 3.6 mmol/L (ref 3.5–5.1)
Sodium: 144 mmol/L (ref 135–145)

## 2022-11-24 LAB — VITAMIN B1: Vitamin B1 (Thiamine): 158.6 nmol/L (ref 66.5–200.0)

## 2022-11-24 NOTE — Care Management Important Message (Signed)
Important Message  Patient Details IM Letter given. Name: [name] MRN: [id number] Date of Birth: 23-Nov-1951   Medicare Important Message Given:  Yes     Kerin Salen 11/24/2022, 1:02 PM

## 2022-11-24 NOTE — Progress Notes (Signed)
Mobility Specialist - Progress Note   11/24/22 1547  Mobility  Activity Ambulated independently to bathroom  Level of Assistance Standby assist, set-up cues, supervision of patient - no hands on  Assistive Device None  Distance Ambulated (ft) 10 ft  Range of Motion/Exercises Active  Activity Response Tolerated well  $Mobility charge 1 Mobility   Pt found getting up from bed and ambulating to bathroom. Pt then ambulated to bed and chair. At the end was left in bed with necessities in reach and husband in room.  Ferd Hibbs Mobility Specialist

## 2022-11-24 NOTE — Progress Notes (Addendum)
PROGRESS NOTE    [name]  GHW:[id number] DOB: [birth year] DOA: 11/17/2022 PCP: Bridget Hartshorn, NP     Brief Narrative:  [name] is a 71 y.o. female with medical history significant of HTN, pre-diabetes, hypothyroidism, NAFLD, Hx of right breast cancer s/p lumpectomy, adjuvant chemotherapy, radiation, herceptin maintenance and currently on antiestrogen therapy, persistent atrial fibrillation on Eliquis who presents with hypoxia.  Patient presented with chief complaints of wheezing, productive cough, difficulty sleeping, increasing lower extremity edema.  In the emergency department, she was requiring 3 L nasal cannula O2.  Chest x-ray with pulmonary congestion.  Patient was started on IV Lasix and admitted to the hospital.  Due to elevation in pCO2, patient was placed on BiPAP and admitted to stepdown unit.  Patient had improvement in mentation and was weaned off BiPAP. Hospitalization further complicated by alcohol withdrawal.   New events last 24 hours / Subjective: She says she feels better, wants to go home.  She says her breathing is better, denies any shortness of breath.  She has trouble sleeping and would like some medication for this.  Denies any prior history of lung disease.  She admits to wearing BiPAP mask, her husband says she has trouble to wear.  Assessment & Plan:   Principal Problem:   Acute respiratory failure with hypoxia and hypercapnia (HCC) Active Problems:   Hypothyroidism   Chronic back pain   Alcohol abuse   Benign essential HTN   Malignant neoplasm of upper-inner quadrant of right breast in female, estrogen receptor positive (HCC)   Hypokalemia   Morbid obesity (HCC)   Acute on chronic diastolic (congestive) heart failure (HCC)   Pulmonary edema   Generalized anxiety disorder   Alcohol abuse with alcohol-induced mood disorder (HCC)   Alcohol abuse with alcohol-induced anxiety disorder (HCC)   Acute hypoxemic and hypercarbic respiratory  failure -SpO2 documented as low as 82% on 2L O2  -Due to her somnolence, ABG was obtained which revealed hypercarbia with pCO2 88, pO2 70  -COVID, influenza negative -CTA chest negative for PE -Patient was started on BiPAP, now receiving BiPAP nightly -Overall pCO2 has trended down, pH has improved -Suspect she definitely has a chronic component of hypercapnic respiratory failure.  Her husband does not report any history of COPD or other underlying lung disease.  She has not had any recent sleep studies.  Will likely need to discuss with pulmonology regarding further management postdischarge -Hold further BiPAP tonight and recheck ABG for pCO2 in a.m. -Continue to wean off oxygen as able  Acute on chronic diastolic heart failure -Strict I's and O's, daily weight -Echocardiogram revealed EF 60 to 65%, no regional wall motion abnormality, mild LVH -Improved, now on p.o. Lasix  Alcohol abuse -CIWA protocol -CIWA scores around 8 today -Seen by psychiatry with recommendations for an inpatient alcohol rehab program.  Initial records indicate that patient and family are agreeable for placement.  Upon my conversation today, patient has made it clear that she does not want to go to inpatient program would rather discharge home on discharge.  Will discuss with psychiatry, but I suspect that inpatient programs will require patient to voluntarily want to be admitted there.  A-fib -Coreg, Eliquis  Demand ischemia -Secondary to above, respiratory failure, heart failure -Troponin trend 45, 48  Hypertension -Coreg, Cozaar  Hypothyroidism -Synthroid  Malignant neoplasm right breast -Status postlumpectomy, chemo and radiation -Followed by Dr. Lindi Adie -Anastrozole   Depression -Appreciate Psych eval -Started on Zoloft  DVT prophylaxis:  apixaban (ELIQUIS) tablet 5 mg  Code Status: Full Family Communication: No family at bedside Disposition Plan:  Status is: Inpatient Remains  inpatient appropriate because: Alcohol withdrawal   Antimicrobials:  Anti-infectives (From admission, onward)    None        Objective: Vitals:   11/24/22 0347 11/24/22 0659 11/24/22 1227 11/24/22 1358  BP:  (!) 140/91 125/61   Pulse: 61 66 63 71  Resp: '16 18 18   '$ Temp:  98.5 F (36.9 C) 97.8 F (36.6 C)   TempSrc:  Oral Oral   SpO2:  100% 100% 92%  Weight:      Height:        Intake/Output Summary (Last 24 hours) at 11/24/2022 1856 Last data filed at 11/24/2022 1400 Gross per 24 hour  Intake 656.29 ml  Output --  Net 656.29 ml   Filed Weights   11/19/22 0620 11/21/22 0500 11/22/22 0358  Weight: 122.3 kg 113.7 kg 115.3 kg   Examination: General exam: Appears agitated Respiratory system: Diminished breath sounds, no respiratory distress Cardiovascular system: S1 & S2 heard, RRR.  No pedal edema. Gastrointestinal system: Abdomen is nondistended, soft and nontender. Normal bowel sounds heard. Central nervous system: Alert  Extremities: Symmetric in appearance bilaterally  Skin: No rashes, lesions or ulcers on exposed skin  Psychiatry: Judgement and insight appear poor.   Data Reviewed: I have personally reviewed following labs and imaging studies  CBC: No results for input(s): "WBC", "NEUTROABS", "HGB", "HCT", "MCV", "PLT" in the last 168 hours. Basic Metabolic Panel: Recent Labs  Lab 11/20/22 0313 11/21/22 0329 11/22/22 0332 11/23/22 0420 11/24/22 0411  NA 145 145 145 145 144  K 3.5 3.2* 3.1* 4.0 3.6  CL 100 95* 96* 101 99  CO2 36* 42* 39* 37* 36*  GLUCOSE 127* 141* 132* 125* 122*  BUN 24* 28* 25* 22 23  CREATININE 0.71 0.73 0.76 0.65 0.68  CALCIUM 8.3* 8.6* 8.8* 8.7* 9.0  MG 1.9  --  1.9  --   --    GFR: Estimated Creatinine Clearance: 80.8 mL/min (by C-G formula based on SCr of 0.68 mg/dL). Liver Function Tests: No results for input(s): "AST", "ALT", "ALKPHOS", "BILITOT", "PROT", "ALBUMIN" in the last 168 hours. No results for input(s):  "LIPASE", "AMYLASE" in the last 168 hours. No results for input(s): "AMMONIA" in the last 168 hours. Coagulation Profile: No results for input(s): "INR", "PROTIME" in the last 168 hours. Cardiac Enzymes: No results for input(s): "CKTOTAL", "CKMB", "CKMBINDEX", "TROPONINI" in the last 168 hours. BNP (last 3 results) No results for input(s): "PROBNP" in the last 8760 hours. HbA1C: No results for input(s): "HGBA1C" in the last 72 hours. CBG: No results for input(s): "GLUCAP" in the last 168 hours. Lipid Profile: No results for input(s): "CHOL", "HDL", "LDLCALC", "TRIG", "CHOLHDL", "LDLDIRECT" in the last 72 hours. Thyroid Function Tests: No results for input(s): "TSH", "T4TOTAL", "FREET4", "T3FREE", "THYROIDAB" in the last 72 hours. Anemia Panel: No results for input(s): "VITAMINB12", "FOLATE", "FERRITIN", "TIBC", "IRON", "RETICCTPCT" in the last 72 hours.  Sepsis Labs: No results for input(s): "PROCALCITON", "LATICACIDVEN" in the last 168 hours.  Recent Results (from the past 240 hour(s))  Resp Panel by RT-PCR (Flu A&B, Covid) Anterior Nasal Swab     Status: None   Collection Time: 11/17/22  3:16 PM   Specimen: Anterior Nasal Swab  Result Value Ref Range Status   SARS Coronavirus 2 by RT PCR NEGATIVE NEGATIVE Final    Comment: (NOTE) SARS-CoV-2 target nucleic  acids are NOT DETECTED.  The SARS-CoV-2 RNA is generally detectable in upper respiratory specimens during the acute phase of infection. The lowest concentration of SARS-CoV-2 viral copies this assay can detect is 138 copies/mL. A negative result does not preclude SARS-Cov-2 infection and should not be used as the sole basis for treatment or other patient management decisions. A negative result may occur with  improper specimen collection/handling, submission of specimen other than nasopharyngeal swab, presence of viral mutation(s) within the areas targeted by this assay, and inadequate number of viral copies(<138  copies/mL). A negative result must be combined with clinical observations, patient history, and epidemiological information. The expected result is Negative.  Fact Sheet for Patients:  EntrepreneurPulse.com.au  Fact Sheet for Healthcare Providers:  IncredibleEmployment.be  This test is no t yet approved or cleared by the [place] FDA and  has been authorized for detection and/or diagnosis of SARS-CoV-2 by FDA under an Emergency Use Authorization (EUA). This EUA will remain  in effect (meaning this test can be used) for the duration of the COVID-19 declaration under Section 564(b)(1) of the Act, 21 U.S.C.section 360bbb-3(b)(1), unless the authorization is terminated  or revoked sooner.       Influenza A by PCR NEGATIVE NEGATIVE Final   Influenza B by PCR NEGATIVE NEGATIVE Final    Comment: (NOTE) The Xpert Xpress SARS-CoV-2/FLU/RSV plus assay is intended as an aid in the diagnosis of influenza from Nasopharyngeal swab specimens and should not be used as a sole basis for treatment. Nasal washings and aspirates are unacceptable for Xpert Xpress SARS-CoV-2/FLU/RSV testing.  Fact Sheet for Patients: EntrepreneurPulse.com.au  Fact Sheet for Healthcare Providers: IncredibleEmployment.be  This test is not yet approved or cleared by the [place] FDA and has been authorized for detection and/or diagnosis of SARS-CoV-2 by FDA under an Emergency Use Authorization (EUA). This EUA will remain in effect (meaning this test can be used) for the duration of the COVID-19 declaration under Section 564(b)(1) of the Act, 21 U.S.C. section 360bbb-3(b)(1), unless the authorization is terminated or revoked.  Performed at [facility], [place] [address], [place], [place] [address]   MRSA Next Gen by PCR, Nasal     Status: None   Collection Time: 11/19/22  6:42 AM   Specimen: Nasal Mucosa; Nasal Swab   Result Value Ref Range Status   MRSA by PCR Next Gen NOT DETECTED NOT DETECTED Final    Comment: (NOTE) The GeneXpert MRSA Assay (FDA approved for NASAL specimens only), is one component of a comprehensive MRSA colonization surveillance program. It is not intended to diagnose MRSA infection nor to guide or monitor treatment for MRSA infections. Test performance is not FDA approved in patients less than 49 years old. Performed at [facility], [place] [address] [place], [place] [address]       Radiology Studies: No results found.    Scheduled Meds:  anastrozole  1 mg Oral Daily   apixaban  5 mg Oral BID   carvedilol  12.5 mg Oral BID   folic acid  1 mg Oral Daily   furosemide  40 mg Oral Daily   levothyroxine  175 mcg Oral QAC breakfast   losartan  50 mg Oral Daily   multivitamin with minerals  1 tablet Oral Daily   sertraline  50 mg Oral Daily   sodium chloride flush  3 mL Intravenous Q12H   Continuous Infusions:  sodium chloride Stopped (11/24/22 1355)     LOS: 7 days  Kathie Dike, MD [facility] 11/24/2022, 6:56 PM   Available via Epic secure chat 7am-7pm After these hours, please refer to coverage provider listed on [email or web address]

## 2022-11-24 NOTE — Plan of Care (Signed)
Patient rouses and removes bipap. Nasal cannula placed in nostrils, patient remains semi confused. Iv in right ac removed while nurse out of room, replaced with 20 in right forearm without patient distress.  Patient  confused to place and situation, clearer than beginning of shift. Knows place- hospital- and name and birthday.

## 2022-11-24 NOTE — Plan of Care (Signed)
Patient resting in bed, anxiety much improved from previous shift.  Cognition improved but not at baseline for patient.

## 2022-11-24 NOTE — Plan of Care (Signed)
°  Problem: Clinical Measurements: °Goal: Will remain free from infection °Outcome: Progressing °Goal: Respiratory complications will improve °Outcome: Progressing °Goal: Cardiovascular complication will be avoided °Outcome: Progressing °  °Problem: Activity: °Goal: Risk for activity intolerance will decrease °Outcome: Progressing °  °Problem: Nutrition: °Goal: Adequate nutrition will be maintained °Outcome: Progressing °  °Problem: Coping: °Goal: Level of anxiety will decrease °Outcome: Progressing °  °Problem: Elimination: °Goal: Will not experience complications related to bowel motility °Outcome: Progressing °Goal: Will not experience complications related to urinary retention °Outcome: Progressing °  °Problem: Pain Managment: °Goal: General experience of comfort will improve °Outcome: Progressing °  °Problem: Safety: °Goal: Ability to remain free from injury will improve °Outcome: Progressing °  °Problem: Skin Integrity: °Goal: Risk for impaired skin integrity will decrease °Outcome: Progressing °  °

## 2022-11-24 NOTE — Consult Note (Signed)
[facility] Face-to-Face Psychiatry Consult   Reason for Consult:  ''Depression and anxiety'' Referring Physician:  Dr. Maylene Roes Patient Identification: [name] MRN:  [id number] Principal Diagnosis: Acute respiratory failure with hypoxia and hypercapnia ([place]) Diagnosis:  Principal Problem:   Acute respiratory failure with hypoxia and hypercapnia ([place]) Active Problems:   Hypothyroidism   Chronic back pain   Alcohol abuse   Benign essential HTN   Malignant neoplasm of upper-inner quadrant of right breast in female, estrogen receptor positive ([place])   Hypokalemia   Morbid obesity ([place])   Acute on chronic diastolic (congestive) heart failure ([place])   Pulmonary edema   Generalized anxiety disorder   Alcohol abuse with alcohol-induced mood disorder ([place])   Alcohol abuse with alcohol-induced anxiety disorder ([place])   Total Time spent with patient: 45 minutes  Subjective:    ''I am not going to any inpatient facility.  I do not want to be here now.''  Objective:  Patient seen and assessed by this psychiatric nurse practitioner.  Patient's psychiatric condition has improved and is now beginning to stabilize.  She shows no evident signs of ongoing alcohol withdrawal as evident by reduce psychosis, confusion, reduced tremors of hands, irritability, no obvious sweating, fever, or abnormal vital signs.  Patient is completing her alcohol detox treatment for and has been hospitalized for approximately 8 days.  At this time inpatient rehabilitation would be beneficial, as patient has completed detox, and is no longer a danger to self or others.  She has continued to deny all suicidal ideations, suicidal intent.  Patient does appear to be future oriented to pursue inpatient psychiatric treatment for alcohol use disorder.   Patient is able to identify support system which she describes as her husband, daughter and friends who will be able to assist her and offer support to her sobriety and rehabilitation.   Psychiatry will continue to follow. She has only received '1mg'$  of Ativan today, and her most recent CIWA score is 4 indicating improvement in her symptoms.   HPI: [name] is a 71 y.o. female with medical history significant of HTN, pre-diabetes, hypothyroidism, NAFLD, Hx of right breast cancer s/p lumpectomy, adjuvant chemotherapy, radiation, herceptin maintenance and currently on antiestrogen therapy, persistent atrial fibrillation on Eliquis who presents with hypoxia.  Patient presented with chief complaints of wheezing, productive cough, difficulty sleeping, increasing lower extremity edema.  In the emergency department, she was requiring 3 L nasal cannula O2.  Chest x-ray with pulmonary congestion.  Patient was started on IV Lasix and admitted to the hospital.  Due to elevation in pCO2, patient was placed on BiPAP and admitted to stepdown unit.   Past Psychiatric History:Substance abuse  Risk to Self:   Denies Risk to Others:  Denies Prior Inpatient Therapy:   Denies Prior Outpatient Therapy:   Denies  Past Medical History:  Past Medical History:  Diagnosis Date   Anxiety    Atrial fibrillation [facility])    sees Dr. Audie Box   Bilateral swelling of feet    Cancer [facility])    breast   Chronic diarrhea    Chronic pain    Concussion 02/2007   ICU x 3 days   Depression    Hypertension    Hypothyroidism    Personal history of chemotherapy    Personal history of radiation therapy    Spinal stenosis    Thyroid disease ?1994   Vitamin D deficiency     Past Surgical History:  Procedure Laterality Date   BREAST  BIOPSY Right 12/20/2019   x2   BREAST LUMPECTOMY Right 01/22/2020   BREAST LUMPECTOMY WITH RADIOACTIVE SEED AND SENTINEL LYMPH NODE BIOPSY Right 01/22/2020   Procedure: RIGHT BREAST LUMPECTOMY WITH RADIOACTIVE SEED X2 AND RIGHT SENTINEL LYMPH NODE MAPPING;  Surgeon: Erroll Luna, MD;  Location: [place];  Service: General;  Laterality: Right;   BREAST  SURGERY Right 03/1998   breast biopsy, benign   EYE SURGERY Right    PORT-A-CATH REMOVAL Right 04/30/2021   Procedure: REMOVAL PORT-A-CATH;  Surgeon: Erroll Luna, MD;  Location: [place];  Service: General;  Laterality: Right;   PORTACATH PLACEMENT Right 01/22/2020   Procedure: INSERTION PORT-A-CATH WITH ULTRASOUND;  Surgeon: Erroll Luna, MD;  Location: [place];  Service: General;  Laterality: Right;   PORTACATH PLACEMENT Right 02/28/2020   Procedure: PORT A CATH REVISION;  Surgeon: Erroll Luna, MD;  Location: [place];  Service: General;  Laterality: Right;   REVERSE SHOULDER ARTHROPLASTY Left 02/02/2021   Procedure: LEFT REVERSE SHOULDER ARTHROPLASTY;  Surgeon: Meredith Pel, MD;  Location: [place];  Service: Orthopedics;  Laterality: Left;   SHOULDER SURGERY Right    SPINAL FUSION  03/04/2011   with ORIF   Family History:  Family History  Problem Relation Age of Onset   Thyroid disease Mother    Dementia Mother    Depression Mother    Anxiety disorder Mother    Diabetes Father    Stroke Father    Alcoholism Father    Hypertension Sister    Diabetes Sister    Colon cancer Neg Hx    Colon polyps Neg Hx    Stomach cancer Neg Hx    Esophageal cancer Neg Hx    Family Psychiatric  History: Denies Social History:  Social History   Substance and Sexual Activity  Alcohol Use Yes   Alcohol/week: 5.0 standard drinks of alcohol   Types: 5 Standard drinks or equivalent per week   Comment: wine     Social History   Substance and Sexual Activity  Drug Use No    Social History   Socioeconomic History   Marital status: Married    Spouse name: Not on file   Number of children: 1   Years of education: Not on file   Highest education level: Not on file  Occupational History   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  Tobacco Use   Smoking status: Former    Years: 20.00    Types: Cigarettes    Quit date: 1980    Years since  quitting: 43.9   Smokeless tobacco: Never  Vaping Use   Vaping Use: Never used  Substance and Sexual Activity   Alcohol use: Yes    Alcohol/week: 5.0 standard drinks of alcohol    Types: 5 Standard drinks or equivalent per week    Comment: wine   Drug use: No   Sexual activity: Not Currently    Partners: Male    Birth control/protection: Post-menopausal  Other Topics Concern   Not on file  Social History Narrative   Not on file   Social Determinants of Health   Financial Resource Strain: Not on file  Food Insecurity: No Food Insecurity (11/19/2022)   Hunger Vital Sign    Worried About Running Out of Food in the Last Year: Never true    Ran Out of Food in the Last Year: Never true  Transportation Needs: No Transportation Needs (11/19/2022)   [facility] - Transportation  Lack of Transportation (Medical): No    Lack of Transportation (Non-Medical): No  Physical Activity: Not on file  Stress: Not on file  Social Connections: Not on file   Additional Social History:    Allergies:   Allergies  Allergen Reactions   Zolpidem Tartrate Other (See Comments)    Hallucinations and Confusion Other reaction(s): Hallucinations confusion     Labs:  Results for orders placed or performed during the hospital encounter of 11/17/22 (from the past 48 hour(s))  Basic metabolic panel     Status: Abnormal   Collection Time: 11/23/22  4:20 AM  Result Value Ref Range   Sodium 145 135 - 145 mmol/L   Potassium 4.0 3.5 - 5.1 mmol/L   Chloride 101 98 - 111 mmol/L   CO2 37 (H) 22 - 32 mmol/L   Glucose, Bld 125 (H) 70 - 99 mg/dL    Comment: Glucose reference range applies only to samples taken after fasting for at least 8 hours.   BUN 22 8 - 23 mg/dL   Creatinine, Ser 0.65 0.44 - 1.00 mg/dL   Calcium 8.7 (L) 8.9 - 10.3 mg/dL   GFR, Estimated >60 >60 mL/min    Comment: (NOTE) Calculated using the CKD-EPI Creatinine Equation (2021)    Anion gap 7 5 - 15    Comment: Performed at [facility], [place] [address] [place], [place] [address]  Basic metabolic panel     Status: Abnormal   Collection Time: 11/24/22  4:11 AM  Result Value Ref Range   Sodium 144 135 - 145 mmol/L   Potassium 3.6 3.5 - 5.1 mmol/L   Chloride 99 98 - 111 mmol/L   CO2 36 (H) 22 - 32 mmol/L   Glucose, Bld 122 (H) 70 - 99 mg/dL    Comment: Glucose reference range applies only to samples taken after fasting for at least 8 hours.   BUN 23 8 - 23 mg/dL   Creatinine, Ser 0.68 0.44 - 1.00 mg/dL   Calcium 9.0 8.9 - 10.3 mg/dL   GFR, Estimated >60 >60 mL/min    Comment: (NOTE) Calculated using the CKD-EPI Creatinine Equation (2021)    Anion gap 9 5 - 15    Comment: Performed at [facility], [place] [address], [place], [place] [address]    Current Facility-Administered Medications  Medication Dose Route Frequency Provider Last Rate Last Admin   0.9 %  sodium chloride infusion  250 mL Intravenous PRN Tu, Ching T, DO   Stopped at 11/24/22 1355   acetaminophen (TYLENOL) tablet 650 mg  650 mg Oral Q4H PRN Tu, Ching T, DO       anastrozole (ARIMIDEX) tablet 1 mg  1 mg Oral Daily Tu, Ching T, DO   1 mg at 11/24/22 1034   apixaban (ELIQUIS) tablet 5 mg  5 mg Oral BID Tu, Ching T, DO   5 mg at 11/24/22 1032   carvedilol (COREG) tablet 12.5 mg  12.5 mg Oral BID Dessa Phi, DO   12.5 mg at 11/24/22 1032   diazepam (VALIUM) tablet 10 mg  10 mg Oral QHS PRN Dessa Phi, DO   10 mg at 11/23/22 2122   fentaNYL (SUBLIMAZE) injection 12.5 mcg  12.5 mcg Intravenous Q2H PRN Dessa Phi, DO   12.5 mcg at 90/24/09 7353   folic acid (FOLVITE) tablet 1 mg  1 mg Oral Daily Tu, Ching T, DO   1 mg at 11/24/22 1033   furosemide (LASIX) tablet  40 mg  40 mg Oral Daily Dessa Phi, DO   40 mg at 11/24/22 1033   haloperidol lactate (HALDOL) injection 5 mg  5 mg Intravenous Q8H PRN Suella Broad, FNP   5 mg at 11/23/22 0840   hydrALAZINE (APRESOLINE) injection 10 mg  10 mg Intravenous  Q6H PRN Tu, Ching T, DO       levothyroxine (SYNTHROID) tablet 175 mcg  175 mcg Oral QAC breakfast Dessa Phi, DO   175 mcg at 11/24/22 0546   LORazepam (ATIVAN) tablet 1-4 mg  1-4 mg Oral Q1H PRN Dessa Phi, DO   1 mg at 11/24/22 1355   Or   LORazepam (ATIVAN) injection 1-4 mg  1-4 mg Intravenous Q1H PRN Dessa Phi, DO   2 mg at 11/23/22 1508   losartan (COZAAR) tablet 50 mg  50 mg Oral Daily Dessa Phi, DO   50 mg at 11/24/22 1033   multivitamin with minerals tablet 1 tablet  1 tablet Oral Daily Tu, Ching T, DO   1 tablet at 11/24/22 1032   ondansetron (ZOFRAN) injection 4 mg  4 mg Intravenous Q6H PRN Tu, Ching T, DO       Oral care mouth rinse  15 mL Mouth Rinse PRN Dessa Phi, DO       sertraline (ZOLOFT) tablet 50 mg  50 mg Oral Daily Suella Broad, FNP   50 mg at 11/24/22 1033   sodium chloride flush (NS) 0.9 % injection 3 mL  3 mL Intravenous Q12H Tu, Ching T, DO   3 mL at 11/24/22 1034   sodium chloride flush (NS) 0.9 % injection 3 mL  3 mL Intravenous PRN Tu, Ching T, DO        Musculoskeletal: Strength & Muscle Tone: within normal limits Gait & Station: normal Patient leans: N/A       Psychiatric Specialty Exam:  Presentation  General Appearance:  Appropriate for Environment; Disheveled  Eye Contact: Good  Speech: Clear and Coherent; Normal Rate  Speech Volume: Normal  Handedness: Right   Mood and Affect  Mood: Labile; Anxious  Affect: Congruent; Labile; Tearful   Thought Process  Thought Processes: Coherent; Linear  Descriptions of Associations:Intact  Orientation:Full (Time, Place and Person)  Thought Content:Logical  History of Schizophrenia/Schizoaffective disorder:No Duration of Psychotic Symptoms:N/A Hallucinations:Hallucinations: None  Ideas of Reference:None  Suicidal Thoughts: none reported  Homicidal Thoughts: denies   Sensorium  Memory: Immediate Fair; Recent Fair; Remote  Fair  Judgment: Fair  Insight: Fair   Community education officer  Concentration: Fair  Attention Span: Fair  Recall: [employer] of Knowledge: Fair  Language: Fair   Psychomotor Activity  Psychomotor Activity: Psychomotor Activity: Normal   Assets  Assets: Desire for Improvement; Communication Skills; Leisure Time; Physical Health   Sleep  Sleep: Sleep: Fair   Physical Exam: Physical Exam Vitals and nursing note reviewed.  Constitutional:      Appearance: Normal appearance. She is obese.  HENT:     Head: Normocephalic.  Musculoskeletal:        General: Normal range of motion.  Skin:    General: Skin is warm.     Capillary Refill: Capillary refill takes less than 2 seconds.  Neurological:     General: No focal deficit present.     Mental Status: She is alert and oriented to person, place, and time. Mental status is at baseline.  Psychiatric:        Attention and Perception: Attention and perception normal.  Mood and Affect: Mood normal.        Speech: Speech normal.        Behavior: Behavior normal. Behavior is cooperative.        Thought Content: Thought content normal.        Cognition and Memory: Cognition and memory normal.        Judgment: Judgment normal.    Review of Systems  Psychiatric/Behavioral:  Positive for depression and substance abuse. The patient is nervous/anxious.    Blood pressure 125/61, pulse 71, temperature 97.8 F (36.6 C), temperature source Oral, resp. rate 18, height '5\' 6"'$  (1.676 m), weight 115.3 kg, SpO2 92 %. Body mass index is 41.67 kg/m.  71 year old female with absent psychiatric history, undiagnosed psychiatric history, who presents on today's evaluation with symptoms consistent with delirium tremens, complication from alcohol withdrawal.  We have provided recommendations throughout course of admission, and will continue to monitor.  See below for current recommendations and ongoing management.    Treatment Plan  Summary: Daily contact with patient to assess and evaluate symptoms and progress in treatment, Medication management, and Plan    Plan/Recommendations:  Continue CIWA protocol  Continue to monitor/Ativan detox  Folate, vitamin B12; Thiamine levels pending. Thiamine 500 mg IV x 3 days. Completed  Continue folic acid supplementation   1:1 safety sitter -Will continue EKG, QTc 462  Will continue Haldol prn agitation, psychosis and delusions.  Initial 2 mg dose, ineffective will increase to Haldol 5 mg IV push every 8 hours as needed. -Continue Synthroid tablet 149mg po daily -Consult TOC for placement in inpatient drug/alcohol rehabilitation facility.  Family prefers door to door transfer.  -Continue Zoloft 50 mg po daily for depression/anxiety  Disposition:  -Recommend referral to drug/alcohol rehab facility agreed to by patient and family (Husband) after patient is medically cleared.  -Supportive therapy provided about ongoing stressors. -Psychiatric service will follow this patient.   TSuella Broad FNP 11/24/2022 4:08 PM

## 2022-11-24 NOTE — Progress Notes (Addendum)
Occupational Therapy Treatment Patient Details Name: [name] MRN: [id number] DOB: 13-Jul-1951 Today's Date: 11/24/2022   History of present illness [name] is a 71 y.o. female admitted to the hospital with hypoxia & was found to have acute hypoxemic and hypercarbic respiratory failure and heart failure. PMH: HTN, pre-diabetes, hypothyroidism, NAFLD, Hx of right breast cancer s/p lumpectomy, adjuvant chemotherapy and radiation, persistent atrial fibrillation .   OT comments  The patient required min guard assist to stand using a RW, for grooming standing at the sink, and for performing a toilet transfer at bathroom level. She reported having difficulty with donning and doffing her socks at her baseline, therefore OT educated her on how to use a sock aid to donn socks; she had moderate difficulty with this, with further education recommended this regard. She was on 2L O2, and she denied feelings of shortness of breath with activity; her O2 saturation was 98% at rest on 2L and 94% on 2L with activity. She required intermittent cues for short term memory/recall, safety awareness during tasks, and she appeared to be with slight confusion; given this OT recommends she have increased supervision if she returns home at discharge. Continue OT plan of care to maximize her safety & independence with self-care tasks.    Recommendations for follow up therapy are one component of a multi-disciplinary discharge planning process, led by the attending physician.  Recommendations may be updated based on patient status, additional functional criteria and insurance authorization.    Follow Up Recommendations  Home health OT with increased supervision    Assistance Recommended at Discharge Frequent Supervision/Assistance  Patient can return home with the following  A little help with bathing/dressing/bathroom;Assistance with cooking/housework;Help with stairs or ramp for entrance;A little help with  walking and/or transfers;Direct supervision/assist for medications management   Equipment Recommendations  None recommended by OT       Precautions / Restrictions Precautions Precautions: Fall Restrictions Weight Bearing Restrictions: No       Mobility Bed Mobility      General bed mobility comments: pt was received seated in the bedside chair    Transfers Overall transfer level: Needs assistance Equipment used: Rolling walker (2 wheels) Transfers: Sit to/from Stand Sit to Stand: Min guard           General transfer comment: Steady assist         ADL either performed or assessed with clinical judgement   ADL Overall ADL's : Needs assistance/impaired Eating/Feeding: Set up;Sitting Eating/Feeding Details (indicate cue type and reason): at chair level Grooming: Min guard;Standing Grooming Details (indicate cue type and reason): She required SBA to min guard assist in order to perform teeth brushing and hair brushing. She required verbal cues for memory/recall & to step closer to the sink in order to safety perform tasks.             Lower Body Dressing: Moderate assistance;With adaptive equipment Lower Body Dressing Details (indicate cue type and reason): Pt reported difficulty with sock management at her baseline. OT instructed her on using a sock aid and reacher for sock management. She required assist & cues for proper AE use/sequencing, order to use the sock aid Toilet Transfer: Min guard;Regular Toilet;Rolling walker (2 wheels);Grab bars Toilet Transfer Details (indicate cue type and reason): verbal cues provided for walker placement/keeping walker closer to body prior to sitting & using grab bar as needed for support during transfer  Cognition Arousal/Alertness: Awake/alert            General Comments: Cooperative, impaired short-term memory/recall, required occasional cues for safety awareness, required occasional  assist for problem solving                   Pertinent Vitals/ Pain       Pain Assessment Pain Assessment: No/denies pain         Frequency  Min 2X/week        Progress Toward Goals  OT Goals(current goals can now be found in the care plan section)     Acute Rehab OT Goals Patient Stated Goal: to return home soon OT Goal Formulation: With patient Time For Goal Achievement: 12/03/22 Potential to Achieve Goals: Good  Plan Discharge plan remains appropriate       AM-PAC OT "6 Clicks" Daily Activity     Outcome Measure   Help from another person eating meals?: None Help from another person taking care of personal grooming?: A Little Help from another person toileting, which includes using toliet, bedpan, or urinal?: A Little Help from another person bathing (including washing, rinsing, drying)?: A Little Help from another person to put on and taking off regular upper body clothing?: A Little Help from another person to put on and taking off regular lower body clothing?: A Little 6 Click Score: 19    End of Session Equipment Utilized During Treatment: Gait belt;Rolling walker (2 wheels)  OT Visit Diagnosis: Muscle weakness (generalized) (M62.81)   Activity Tolerance Patient tolerated treatment well   Patient Left in chair;with chair alarm set;with call bell/phone within reach   Nurse Communication Mobility status        Time: 1000-1028 OT Time Calculation (min): 28 min  Charges: OT General Charges $OT Visit: 1 Visit OT Treatments $Self Care/Home Management : 23-37 mins    Leota Sauers, OTR/L 11/24/2022, 11:56 AM

## 2022-11-24 NOTE — Progress Notes (Signed)
Physical Therapy Treatment Patient Details Name: [name] MRN: [id number] DOB: 1951-09-04 Today's Date: 11/24/2022   History of Present Illness [name] is a 71 y.o. female admitted to the hospital with hypoxia & was found to have acute hypoxemic and hypercarbic respiratory failure and heart failure. PMH: HTN, pre-diabetes, hypothyroidism, NAFLD, Hx of right breast cancer s/p lumpectomy, adjuvant chemotherapy and radiation, persistent atrial fibrillation .    PT Comments    The patient resting in bed, asking frequently about going home and emotionally labile. Patient ambulated x 150' with Rw. Spouse present and Psychiatry NP in . Continue ambulation   Recommendations for follow up therapy are one component of a multi-disciplinary discharge planning process, led by the attending physician.  Recommendations may be updated based on patient status, additional functional criteria and insurance authorization.  Follow Up Recommendations  Home health PT if Dc home, Possible DC to inpatient  behavioral hospital     Assistance Recommended at Discharge Frequent or constant Supervision/Assistance  Patient can return home with the following A little help with walking and/or transfers;A little help with bathing/dressing/bathroom;Assistance with cooking/housework;Assist for transportation;Help with stairs or ramp for entrance   Equipment Recommendations  None recommended by PT    Recommendations for Other Services       Precautions / Restrictions Precautions Precautions: Fall     Mobility  Bed Mobility   Bed Mobility: Supine to Sit     Supine to sit: Min guard     General bed mobility comments: pa    Transfers Overall transfer level: Needs assistance Equipment used: Rolling walker (2 wheels) Transfers: Sit to/from Stand             General transfer comment: Steady assist    Ambulation/Gait Ambulation/Gait assistance: Min guard Gait Distance (Feet): 150  Feet Assistive device: Rolling walker (2 wheels) Gait Pattern/deviations: Step-through pattern, Shuffle       General Gait Details: gait steady with RW   Stairs             Wheelchair Mobility    Modified Rankin (Stroke Patients Only)       Balance Overall balance assessment: Mild deficits observed, not formally tested                                          Cognition   Behavior During Therapy: Anxious                                   General Comments: Cooperative, impaired short-term memory/recall, required occasional cues for safety awareness, required occasional assist for problem solving, constantly asking to go home, Patient frequently crying and emotional( Psychiatry NP in  with patient). frquently asking what she  is to do next.        Exercises      General Comments        Pertinent Vitals/Pain      Home Living                          Prior Function            PT Goals (current goals can now be found in the care plan section) Progress towards PT goals: Progressing toward goals    Frequency    Min 3X/week  PT Plan Current plan remains appropriate    Co-evaluation              AM-PAC PT "6 Clicks" Mobility   Outcome Measure  Help needed turning from your back to your side while in a flat bed without using bedrails?: None Help needed moving from lying on your back to sitting on the side of a flat bed without using bedrails?: None Help needed moving to and from a bed to a chair (including a wheelchair)?: A Little Help needed standing up from a chair using your arms (e.g., wheelchair or bedside chair)?: A Little Help needed to walk in hospital room?: A Little Help needed climbing 3-5 steps with a railing? : A Lot 6 Click Score: 19    End of Session Equipment Utilized During Treatment: Gait belt Activity Tolerance: Patient tolerated treatment well Patient left: in chair;with  call bell/phone within reach;with family/visitor present;with chair alarm set Nurse Communication: Mobility status PT Visit Diagnosis: Difficulty in walking, not elsewhere classified (R26.2)     Time: 9678-9381 PT Time Calculation (min) (ACUTE ONLY): 24 min  Charges:  $Gait Training: 23-37 mins                     [place] Office [phone] Weekend pager-[phone]    Claretha Cooper 11/24/2022, 4:19 PM

## 2022-11-25 DIAGNOSIS — I5033 Acute on chronic diastolic (congestive) heart failure: Secondary | ICD-10-CM | POA: Diagnosis not present

## 2022-11-25 DIAGNOSIS — F411 Generalized anxiety disorder: Secondary | ICD-10-CM

## 2022-11-25 DIAGNOSIS — F1014 Alcohol abuse with alcohol-induced mood disorder: Secondary | ICD-10-CM

## 2022-11-25 DIAGNOSIS — I10 Essential (primary) hypertension: Secondary | ICD-10-CM | POA: Diagnosis not present

## 2022-11-25 DIAGNOSIS — J9601 Acute respiratory failure with hypoxia: Secondary | ICD-10-CM | POA: Diagnosis not present

## 2022-11-25 DIAGNOSIS — F1010 Alcohol abuse, uncomplicated: Secondary | ICD-10-CM | POA: Diagnosis not present

## 2022-11-25 LAB — BLOOD GAS, ARTERIAL
Acid-Base Excess: 13.4 mmol/L — ABNORMAL HIGH (ref 0.0–2.0)
Bicarbonate: 40.5 mmol/L — ABNORMAL HIGH (ref 20.0–28.0)
Drawn by: 56037
O2 Saturation: 91.7 %
Patient temperature: 36.9
pCO2 arterial: 61 mmHg — ABNORMAL HIGH (ref 32–48)
pH, Arterial: 7.43 (ref 7.35–7.45)
pO2, Arterial: 58 mmHg — ABNORMAL LOW (ref 83–108)

## 2022-11-25 LAB — BASIC METABOLIC PANEL
Anion gap: 11 (ref 5–15)
BUN: 23 mg/dL (ref 8–23)
CO2: 33 mmol/L — ABNORMAL HIGH (ref 22–32)
Calcium: 8.8 mg/dL — ABNORMAL LOW (ref 8.9–10.3)
Chloride: 100 mmol/L (ref 98–111)
Creatinine, Ser: 0.75 mg/dL (ref 0.44–1.00)
GFR, Estimated: >60 mL/min (ref >60)
Glucose, Bld: 124 mg/dL — ABNORMAL HIGH (ref 70–99)
Potassium: 3.5 mmol/L (ref 3.5–5.1)
Sodium: 144 mmol/L (ref 135–145)

## 2022-11-25 LAB — CBC
HCT: 40.1 % (ref 36.0–46.0)
Hemoglobin: 12.3 g/dL (ref 12.0–15.0)
MCH: 32.5 pg (ref 26.0–34.0)
MCHC: 30.7 g/dL (ref 30.0–36.0)
MCV: 105.8 fL — ABNORMAL HIGH (ref 80.0–100.0)
Platelets: 268 10*3/uL (ref 150–400)
RBC: 3.79 MIL/uL — ABNORMAL LOW (ref 3.87–5.11)
RDW: 13.8 % (ref 11.5–15.5)
WBC: 6.6 10*3/uL (ref 4.0–10.5)
nRBC: 0 % (ref 0.0–0.2)

## 2022-11-25 NOTE — TOC Progression Note (Signed)
Transition of Care [facility]) - Progression Note    Patient Details  Name: [name] MRN: [id number] Date of Birth: 1951-04-17  Transition of Care [facility]) CM/SW Contact  Leeroy Cha, RN Phone Number: 11/25/2022, 11:50 AM  Clinical Narrative:    Face sheet, h andp pt and ot notes and [facility]  [phone].   Expected Discharge Plan: Home/Self Care Barriers to Discharge: Continued Medical Work up  Expected Discharge Plan and Services Expected Discharge Plan: Home/Self Care   Discharge Planning Services: CM Consult   Living arrangements for the past 2 months: Single Family Home                                       Social Determinants of Health (SDOH) Interventions    Readmission Risk Interventions   No data to display

## 2022-11-25 NOTE — Progress Notes (Signed)
PROGRESS NOTE    [name]  [id number] DOB: November 10, 1951 DOA: 11/17/2022 PCP: Bridget Hartshorn, NP     Brief Narrative:  [name] is a 71 y.o. female with medical history significant of HTN, pre-diabetes, hypothyroidism, NAFLD, Hx of right breast cancer s/p lumpectomy, adjuvant chemotherapy, radiation, herceptin maintenance and currently on antiestrogen therapy, persistent atrial fibrillation on Eliquis who presents with hypoxia.  Patient presented with chief complaints of wheezing, productive cough, difficulty sleeping, increasing lower extremity edema.  In the emergency department, she was requiring 3 L nasal cannula O2.  Chest x-ray with pulmonary congestion.  Patient was started on IV Lasix and admitted to the hospital.  Due to elevation in pCO2, patient was placed on BiPAP and admitted to stepdown unit.  Patient had improvement in mentation and was weaned off BiPAP. Hospitalization further complicated by alcohol withdrawal.  Overall withdrawal issues appear to be doing better.  She has been medically and psychiatrically cleared for discharge pending placement at residential alcohol rehab program  New events last 24 hours / Subjective: She is asking questions about her discharge disposition have to get to a residential alcohol rehab program.  Denies any shortness of breath.  Assessment & Plan:   Principal Problem:   Acute respiratory failure with hypoxia and hypercapnia (HCC) Active Problems:   Hypothyroidism   Chronic back pain   Alcohol abuse   Benign essential HTN   Malignant neoplasm of upper-inner quadrant of right breast in female, estrogen receptor positive (HCC)   Hypokalemia   Morbid obesity (HCC)   Acute on chronic diastolic (congestive) heart failure (HCC)   Pulmonary edema   Generalized anxiety disorder   Alcohol abuse with alcohol-induced mood disorder (HCC)   Alcohol abuse with alcohol-induced anxiety disorder (HCC)   Acute hypoxemic and  hypercarbic respiratory failure -SpO2 documented as low as 82% on 2L O2  -Due to her somnolence, ABG was obtained which revealed hypercarbia with pCO2 88, pO2 70  -COVID, influenza negative -CTA chest negative for PE -Patient was started on BiPAP, now receiving BiPAP nightly -Overall pCO2 has trended down, pH has improved -Suspect respiratory failure precipitated by decompensated CHF -Suspect she definitely has a chronic component of hypercapnic respiratory failure.  Her husband does not report any history of COPD or other underlying lung disease.  She has not had any recent sleep studies.  Will likely need to follow-up with pulmonology regarding further workup -She has not had BiPAP and overall pCO2/pH are stable -She is currently on room air  Acute on chronic diastolic heart failure -Strict I's and O's, daily weight -Echocardiogram revealed EF 60 to 65%, no regional wall motion abnormality, mild LVH -Improved, now on p.o. Lasix  Alcohol abuse -CIWA protocol -Seen by psychiatry with recommendations for an inpatient alcohol rehab program.  Patient and family are agreeable to placement.  A-fib -Coreg, Eliquis  Demand ischemia -Secondary to above, respiratory failure, heart failure -Troponin trend 45, 48  Hypertension -Coreg, Cozaar  Hypothyroidism -Synthroid  Malignant neoplasm right breast -Status postlumpectomy, chemo and radiation -Followed by Dr. Lindi Adie -Anastrozole   Depression -Appreciate Psych eval -Started on Zoloft    DVT prophylaxis:  apixaban (ELIQUIS) tablet 5 mg  Code Status: Full Family Communication: Discussed with patient's husband 12/6 Disposition Plan:  Status is: Inpatient Remains inpatient appropriate because: Alcohol withdrawal   Antimicrobials:  Anti-infectives (From admission, onward)    None        Objective: Vitals:   11/25/22 0423 11/25/22 0500 11/25/22 0515  11/25/22 1110  BP: (!) 154/91   118/75  Pulse: 72   77  Resp: 20    16  Temp: 98.4 F (36.9 C)   98.1 F (36.7 C)  TempSrc: Oral   Oral  SpO2: (!) 88%  91% 100%  Weight:  115.5 kg    Height:        Intake/Output Summary (Last 24 hours) at 11/25/2022 2024 Last data filed at 11/25/2022 1938 Gross per 24 hour  Intake 600 ml  Output --  Net 600 ml   Filed Weights   11/21/22 0500 11/22/22 0358 11/25/22 0500  Weight: 113.7 kg 115.3 kg 115.5 kg   Examination: General exam: Appears to be calm, without distress Respiratory system: Diminished breath sounds, no respiratory distress Cardiovascular system: S1 & S2 heard, RRR.  No pedal edema. Gastrointestinal system: Abdomen is nondistended, soft and nontender. Normal bowel sounds heard. Central nervous system: Alert  Extremities: Symmetric in appearance bilaterally  Skin: No rashes, lesions or ulcers on exposed skin  Psychiatry: Pleasant, mildly anxious  Data Reviewed: I have personally reviewed following labs and imaging studies  CBC: Recent Labs  Lab 11/25/22 0421  WBC 6.6  HGB 12.3  HCT 40.1  MCV 105.8*  PLT 081   Basic Metabolic Panel: Recent Labs  Lab 11/20/22 0313 11/21/22 0329 11/22/22 0332 11/23/22 0420 11/24/22 0411 11/25/22 0421  NA 145 145 145 145 144 144  K 3.5 3.2* 3.1* 4.0 3.6 3.5  CL 100 95* 96* 101 99 100  CO2 36* 42* 39* 37* 36* 33*  GLUCOSE 127* 141* 132* 125* 122* 124*  BUN 24* 28* 25* '22 23 23  '$ CREATININE 0.71 0.73 0.76 0.65 0.68 0.75  CALCIUM 8.3* 8.6* 8.8* 8.7* 9.0 8.8*  MG 1.9  --  1.9  --   --   --    GFR: Estimated Creatinine Clearance: 80.9 mL/min (by C-G formula based on SCr of 0.75 mg/dL). Liver Function Tests: No results for input(s): "AST", "ALT", "ALKPHOS", "BILITOT", "PROT", "ALBUMIN" in the last 168 hours. No results for input(s): "LIPASE", "AMYLASE" in the last 168 hours. No results for input(s): "AMMONIA" in the last 168 hours. Coagulation Profile: No results for input(s): "INR", "PROTIME" in the last 168 hours. Cardiac Enzymes: No results  for input(s): "CKTOTAL", "CKMB", "CKMBINDEX", "TROPONINI" in the last 168 hours. BNP (last 3 results) No results for input(s): "PROBNP" in the last 8760 hours. HbA1C: No results for input(s): "HGBA1C" in the last 72 hours. CBG: No results for input(s): "GLUCAP" in the last 168 hours. Lipid Profile: No results for input(s): "CHOL", "HDL", "LDLCALC", "TRIG", "CHOLHDL", "LDLDIRECT" in the last 72 hours. Thyroid Function Tests: No results for input(s): "TSH", "T4TOTAL", "FREET4", "T3FREE", "THYROIDAB" in the last 72 hours. Anemia Panel: No results for input(s): "VITAMINB12", "FOLATE", "FERRITIN", "TIBC", "IRON", "RETICCTPCT" in the last 72 hours.  Sepsis Labs: No results for input(s): "PROCALCITON", "LATICACIDVEN" in the last 168 hours.  Recent Results (from the past 240 hour(s))  Resp Panel by RT-PCR (Flu A&B, Covid) Anterior Nasal Swab     Status: None   Collection Time: 11/17/22  3:16 PM   Specimen: Anterior Nasal Swab  Result Value Ref Range Status   SARS Coronavirus 2 by RT PCR NEGATIVE NEGATIVE Final    Comment: (NOTE) SARS-CoV-2 target nucleic acids are NOT DETECTED.  The SARS-CoV-2 RNA is generally detectable in upper respiratory specimens during the acute phase of infection. The lowest concentration of SARS-CoV-2 viral copies this assay can detect is 138 copies/mL.  A negative result does not preclude SARS-Cov-2 infection and should not be used as the sole basis for treatment or other patient management decisions. A negative result may occur with  improper specimen collection/handling, submission of specimen other than nasopharyngeal swab, presence of viral mutation(s) within the areas targeted by this assay, and inadequate number of viral copies(<138 copies/mL). A negative result must be combined with clinical observations, patient history, and epidemiological information. The expected result is Negative.  Fact Sheet for Patients:   EntrepreneurPulse.com.au  Fact Sheet for Healthcare Providers:  IncredibleEmployment.be  This test is no t yet approved or cleared by the [place] FDA and  has been authorized for detection and/or diagnosis of SARS-CoV-2 by FDA under an Emergency Use Authorization (EUA). This EUA will remain  in effect (meaning this test can be used) for the duration of the COVID-19 declaration under Section 564(b)(1) of the Act, 21 U.S.C.section 360bbb-3(b)(1), unless the authorization is terminated  or revoked sooner.       Influenza A by PCR NEGATIVE NEGATIVE Final   Influenza B by PCR NEGATIVE NEGATIVE Final    Comment: (NOTE) The Xpert Xpress SARS-CoV-2/FLU/RSV plus assay is intended as an aid in the diagnosis of influenza from Nasopharyngeal swab specimens and should not be used as a sole basis for treatment. Nasal washings and aspirates are unacceptable for Xpert Xpress SARS-CoV-2/FLU/RSV testing.  Fact Sheet for Patients: EntrepreneurPulse.com.au  Fact Sheet for Healthcare Providers: IncredibleEmployment.be  This test is not yet approved or cleared by the [place] FDA and has been authorized for detection and/or diagnosis of SARS-CoV-2 by FDA under an Emergency Use Authorization (EUA). This EUA will remain in effect (meaning this test can be used) for the duration of the COVID-19 declaration under Section 564(b)(1) of the Act, 21 U.S.C. section 360bbb-3(b)(1), unless the authorization is terminated or revoked.  Performed at [facility], [place] [address], [place], [place] [address]   MRSA Next Gen by PCR, Nasal     Status: None   Collection Time: 11/19/22  6:42 AM   Specimen: Nasal Mucosa; Nasal Swab  Result Value Ref Range Status   MRSA by PCR Next Gen NOT DETECTED NOT DETECTED Final    Comment: (NOTE) The GeneXpert MRSA Assay (FDA approved for NASAL specimens only), is one  component of a comprehensive MRSA colonization surveillance program. It is not intended to diagnose MRSA infection nor to guide or monitor treatment for MRSA infections. Test performance is not FDA approved in patients less than 8 years old. Performed at [facility], [place] [address], [place], [place] [address]       Radiology Studies: No results found.    Scheduled Meds:  anastrozole  1 mg Oral Daily   apixaban  5 mg Oral BID   carvedilol  12.5 mg Oral BID   folic acid  1 mg Oral Daily   furosemide  40 mg Oral Daily   levothyroxine  175 mcg Oral QAC breakfast   losartan  50 mg Oral Daily   multivitamin with minerals  1 tablet Oral Daily   sertraline  50 mg Oral Daily   sodium chloride flush  3 mL Intravenous Q12H   Continuous Infusions:  sodium chloride Stopped (11/24/22 1355)     LOS: 8 days     Kathie Dike, MD [facility] 11/25/2022, 8:24 PM   Available via Epic secure chat 7am-7pm After these hours, please refer to coverage provider listed on [email or web address]

## 2022-11-25 NOTE — Progress Notes (Signed)
Mobility Specialist - Progress Note   11/25/22 1439  Mobility  Activity Ambulated with assistance in hallway  Level of Assistance Contact guard assist, steadying assist  Assistive Device Front wheel walker  Distance Ambulated (ft) 150 ft  Range of Motion/Exercises Active  Activity Response Tolerated well  Mobility Referral Yes  $Mobility charge 1 Mobility   Pt was found getting up without assistance. Pt was agreeable to ambulate. Needed cues to avoid hitting objects such as chairs. At EOS returned to bed with necessities in reach.  Ferd Hibbs Mobility Specialist

## 2022-11-25 NOTE — TOC Progression Note (Addendum)
Transition of Care [facility]) - Progression Note    Patient Details  Name: [name] MRN: [id number] Date of Birth: 10-11-1951  Transition [facility] Care [facility]) CM/SW Contact  Leeroy Cha, RN Phone Number: 11/25/2022, 12:48 PM  Clinical Narrative:    [facility]  Insurance is not covered by facility and cost would be 26,000 dollars due at time of admission.     Expected Discharge Plan: Home/Self Care Barriers to Discharge: Continued Medical Work up  Expected Discharge Plan and Services Expected Discharge Plan: Home/Self Care   Discharge Planning Services: CM Consult   Living arrangements for the past 2 months: Single Family Home                                       Social Determinants of Health (SDOH) Interventions    Readmission Risk Interventions   No data to display

## 2022-11-25 NOTE — Consult Note (Signed)
[facility] Face-to-Face Psychiatry Consult   Reason for Consult:  ''Depression and anxiety'' Referring Physician:  Dr. Maylene Roes Patient Identification: [name] MRN:  [id number] Principal Diagnosis: Acute respiratory failure with hypoxia and hypercapnia ([place]) Diagnosis:  Principal Problem:   Acute respiratory failure with hypoxia and hypercapnia ([place]) Active Problems:   Hypothyroidism   Chronic back pain   Alcohol abuse   Benign essential HTN   Malignant neoplasm of upper-inner quadrant of right breast in female, estrogen receptor positive ([place])   Hypokalemia   Morbid obesity ([place])   Acute on chronic diastolic (congestive) heart failure ([place])   Pulmonary edema   Generalized anxiety disorder   Alcohol abuse with alcohol-induced mood disorder ([place])   Alcohol abuse with alcohol-induced anxiety disorder ([place])   Total Time spent with patient: 45 minutes  Subjective:    ''I am not going to any inpatient facility.  I do not want to be here now.''  Objective:  [name] is a 71 y.o.  y.o. female  admitted medically for 11/25/22  AM for acute respiratory failure. She carries the psychiatric diagnoses of depression and substance abuse.Psychiatry was consulted for medication recommendations for depression and family reports history of alcohol use disorder. Outpatient psychotropic medications include amitriptyline, diazepam, duloxetine, and historically he has had a good response to these medications--these have been restarted by the primary team. During this hospitalization she was also started on Zoloft '25mg'$  po daily, titrated to '50mg'$  po daily for management of depression.   On my interview, patient is in alert, oriented, calm, cooperative, and attentive, with normal affect, speech, and behavior. At times she did display difficulty with emotional overreaction which resulted in mood lability and projection.   Objectively, there is no evidence of psychosis/ mania (able to converse coherently,  linear and goal directed thought, does not appear to be responding to internal stimuli and or external stimuli, no distractibility, not pre-occupied, no FOI, etc) nor depression to the point of suicidality (able to concentrate, affect full and reactive, speech normal, no psychomotor retardation/agitation, etc). She is future oriented and endorses receiving inpatient substance use resources at this time. She is currently stable from a psychiatric standpoint.   Patient is open to transferring to inpatient rehab at this  time. TOC Rosana Hoes has been assisting with facilitation at this time and care coordination.   ddx Schizophrenia Medication non compliance Polysubstance abuse  Alcohol use disorder, severe  Recommendations -continue current psychiatric medication -consult TOC/SW for residential rehab referrals and resources for outpatient psychiatric treatment.    Psychiatry has no further recommendations at this time-will sign off. Please reconsult if further assistance is needed    HPI: [name] is a 71 y.o. female with medical history significant of HTN, pre-diabetes, hypothyroidism, NAFLD, Hx of right breast cancer s/p lumpectomy, adjuvant chemotherapy, radiation, herceptin maintenance and currently on antiestrogen therapy, persistent atrial fibrillation on Eliquis who presents with hypoxia.  Patient presented with chief complaints of wheezing, productive cough, difficulty sleeping, increasing lower extremity edema.  In the emergency department, she was requiring 3 L nasal cannula O2.  Chest x-ray with pulmonary congestion.  Patient was started on IV Lasix and admitted to the hospital.  Due to elevation in pCO2, patient was placed on BiPAP and admitted to stepdown unit.   Past Psychiatric History:Substance abuse  Risk to Self:   Denies Risk to Others:  Denies Prior Inpatient Therapy:   Denies Prior Outpatient Therapy:   Denies  Past Medical History:  Past Medical  History:  Diagnosis  Date   Anxiety    Atrial fibrillation [facility])    sees Dr. Audie Box   Bilateral swelling of feet    Cancer [facility])    breast   Chronic diarrhea    Chronic pain    Concussion 02/2007   ICU x 3 days   Depression    Hypertension    Hypothyroidism    Personal history of chemotherapy    Personal history of radiation therapy    Spinal stenosis    Thyroid disease ?1994   Vitamin D deficiency     Past Surgical History:  Procedure Laterality Date   BREAST BIOPSY Right 12/20/2019   x2   BREAST LUMPECTOMY Right 01/22/2020   BREAST LUMPECTOMY WITH RADIOACTIVE SEED AND SENTINEL LYMPH NODE BIOPSY Right 01/22/2020   Procedure: RIGHT BREAST LUMPECTOMY WITH RADIOACTIVE SEED X2 AND RIGHT SENTINEL LYMPH NODE MAPPING;  Surgeon: Erroll Luna, MD;  Location: [place];  Service: General;  Laterality: Right;   BREAST SURGERY Right 03/1998   breast biopsy, benign   EYE SURGERY Right    PORT-A-CATH REMOVAL Right 04/30/2021   Procedure: REMOVAL PORT-A-CATH;  Surgeon: Erroll Luna, MD;  Location: [place];  Service: General;  Laterality: Right;   PORTACATH PLACEMENT Right 01/22/2020   Procedure: INSERTION PORT-A-CATH WITH ULTRASOUND;  Surgeon: Erroll Luna, MD;  Location: [place];  Service: General;  Laterality: Right;   PORTACATH PLACEMENT Right 02/28/2020   Procedure: PORT A CATH REVISION;  Surgeon: Erroll Luna, MD;  Location: [place];  Service: General;  Laterality: Right;   REVERSE SHOULDER ARTHROPLASTY Left 02/02/2021   Procedure: LEFT REVERSE SHOULDER ARTHROPLASTY;  Surgeon: Meredith Pel, MD;  Location: [place];  Service: Orthopedics;  Laterality: Left;   SHOULDER SURGERY Right    SPINAL FUSION  03/04/2011   with ORIF   Family History:  Family History  Problem Relation Age of Onset   Thyroid disease Mother    Dementia Mother    Depression Mother    Anxiety disorder Mother    Diabetes Father    Stroke Father    Alcoholism Father     Hypertension Sister    Diabetes Sister    Colon cancer Neg Hx    Colon polyps Neg Hx    Stomach cancer Neg Hx    Esophageal cancer Neg Hx    Family Psychiatric  History: Denies Social History:  Social History   Substance and Sexual Activity  Alcohol Use Yes   Alcohol/week: 5.0 standard drinks of alcohol   Types: 5 Standard drinks or equivalent per week   Comment: wine     Social History   Substance and Sexual Activity  Drug Use No    Social History   Socioeconomic History   Marital status: Married    Spouse name: Not on file   Number of children: 1   Years of education: Not on file   Highest education level: Not on file  Occupational History   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  Tobacco Use   Smoking status: Former    Years: 20.00    Types: Cigarettes    Quit date: 1980    Years since quitting: 43.9   Smokeless tobacco: Never  Vaping Use   Vaping Use: Never used  Substance and Sexual Activity   Alcohol use: Yes    Alcohol/week: 5.0 standard drinks of alcohol    Types: 5 Standard drinks or equivalent per week    Comment:  wine   Drug use: No   Sexual activity: Not Currently    Partners: Male    Birth control/protection: Post-menopausal  Other Topics Concern   Not on file  Social History Narrative   Not on file   Social Determinants of Health   Financial Resource Strain: Not on file  Food Insecurity: No Food Insecurity (11/19/2022)   Hunger Vital Sign    Worried About Running Out of Food in the Last Year: Never true    Ran Out of Food in the Last Year: Never true  Transportation Needs: No Transportation Needs (11/19/2022)   PRAPARE - Hydrologist (Medical): No    Lack of Transportation (Non-Medical): No  Physical Activity: Not on file  Stress: Not on file  Social Connections: Not on file   Additional Social History:    Allergies:   Allergies  Allergen Reactions   Zolpidem Tartrate Other (See Comments)     Hallucinations and Confusion Other reaction(s): Hallucinations confusion     Labs:  Results for orders placed or performed during the hospital encounter of 11/17/22 (from the past 48 hour(s))  Basic metabolic panel     Status: Abnormal   Collection Time: 11/24/22  4:11 AM  Result Value Ref Range   Sodium 144 135 - 145 mmol/L   Potassium 3.6 3.5 - 5.1 mmol/L   Chloride 99 98 - 111 mmol/L   CO2 36 (H) 22 - 32 mmol/L   Glucose, Bld 122 (H) 70 - 99 mg/dL    Comment: Glucose reference range applies only to samples taken after fasting for at least 8 hours.   BUN 23 8 - 23 mg/dL   Creatinine, Ser 0.68 0.44 - 1.00 mg/dL   Calcium 9.0 8.9 - 10.3 mg/dL   GFR, Estimated >60 >60 mL/min    Comment: (NOTE) Calculated using the CKD-EPI Creatinine Equation (2021)    Anion gap 9 5 - 15    Comment: Performed at [facility], [place] [address], [place], [place] [address]  Basic metabolic panel     Status: Abnormal   Collection Time: 11/25/22  4:21 AM  Result Value Ref Range   Sodium 144 135 - 145 mmol/L   Potassium 3.5 3.5 - 5.1 mmol/L   Chloride 100 98 - 111 mmol/L   CO2 33 (H) 22 - 32 mmol/L   Glucose, Bld 124 (H) 70 - 99 mg/dL    Comment: Glucose reference range applies only to samples taken after fasting for at least 8 hours.   BUN 23 8 - 23 mg/dL   Creatinine, Ser 0.75 0.44 - 1.00 mg/dL   Calcium 8.8 (L) 8.9 - 10.3 mg/dL   GFR, Estimated >60 >60 mL/min    Comment: (NOTE) Calculated using the CKD-EPI Creatinine Equation (2021)    Anion gap 11 5 - 15    Comment: Performed at [facility], [place] [address] [place], [place] [address]  CBC     Status: Abnormal   Collection Time: 11/25/22  4:21 AM  Result Value Ref Range   WBC 6.6 4.0 - 10.5 K/uL   RBC 3.79 (L) 3.87 - 5.11 MIL/uL   Hemoglobin 12.3 12.0 - 15.0 g/dL   HCT 40.1 36.0 - 46.0 %   MCV 105.8 (H) 80.0 - 100.0 fL   MCH 32.5 26.0 - 34.0 pg   MCHC 30.7 30.0 - 36.0 g/dL   RDW 13.8 11.5 - 15.5 %    Platelets 268 150 -  400 K/uL   nRBC 0.0 0.0 - 0.2 %    Comment: Performed at [facility], [place] [address], [place], [place] [address]  Blood gas, arterial     Status: Abnormal   Collection Time: 11/25/22  5:15 AM  Result Value Ref Range   pH, Arterial 7.43 7.35 - 7.45   pCO2 arterial 61 (H) 32 - 48 mmHg   pO2, Arterial 58 (L) 83 - 108 mmHg   Bicarbonate 40.5 (H) 20.0 - 28.0 mmol/L   Acid-Base Excess 13.4 (H) 0.0 - 2.0 mmol/L   O2 Saturation 91.7 %   Patient temperature 36.9    Collection site LEFT RADIAL    Drawn by [id number]    Allens test (pass/fail) PASS PASS    Comment: Performed at [facility], [place] [address], [place], [place] [address]    Current Facility-Administered Medications  Medication Dose Route Frequency Provider Last Rate Last Admin   0.9 %  sodium chloride infusion  250 mL Intravenous PRN Tu, Ching T, DO   Stopped at 11/24/22 1355   acetaminophen (TYLENOL) tablet 650 mg  650 mg Oral Q4H PRN Tu, Ching T, DO       anastrozole (ARIMIDEX) tablet 1 mg  1 mg Oral Daily Tu, Ching T, DO   1 mg at 11/25/22 0905   apixaban (ELIQUIS) tablet 5 mg  5 mg Oral BID Tu, Ching T, DO   5 mg at 11/25/22 0904   carvedilol (COREG) tablet 12.5 mg  12.5 mg Oral BID Dessa Phi, DO   12.5 mg at 11/25/22 7124   diazepam (VALIUM) tablet 10 mg  10 mg Oral QHS PRN Dessa Phi, DO   10 mg at 11/24/22 2127   fentaNYL (SUBLIMAZE) injection 12.5 mcg  12.5 mcg Intravenous Q2H PRN Dessa Phi, DO   12.5 mcg at 58/09/98 3382   folic acid (FOLVITE) tablet 1 mg  1 mg Oral Daily Tu, Ching T, DO   1 mg at 11/25/22 5053   furosemide (LASIX) tablet 40 mg  40 mg Oral Daily Dessa Phi, DO   40 mg at 11/25/22 0904   haloperidol lactate (HALDOL) injection 5 mg  5 mg Intravenous Q8H PRN Suella Broad, FNP   5 mg at 11/23/22 0840   hydrALAZINE (APRESOLINE) injection 10 mg  10 mg Intravenous Q6H PRN Tu, Ching T, DO       levothyroxine (SYNTHROID) tablet 175  mcg  175 mcg Oral QAC breakfast Dessa Phi, DO   175 mcg at 11/25/22 0517   LORazepam (ATIVAN) tablet 1-4 mg  1-4 mg Oral Q1H PRN Dessa Phi, DO   1 mg at 11/25/22 1230   Or   LORazepam (ATIVAN) injection 1-4 mg  1-4 mg Intravenous Q1H PRN Dessa Phi, DO   2 mg at 11/23/22 1508   losartan (COZAAR) tablet 50 mg  50 mg Oral Daily Dessa Phi, DO   50 mg at 11/25/22 9767   multivitamin with minerals tablet 1 tablet  1 tablet Oral Daily Tu, Ching T, DO   1 tablet at 11/25/22 0904   ondansetron (ZOFRAN) injection 4 mg  4 mg Intravenous Q6H PRN Tu, Ching T, DO       Oral care mouth rinse  15 mL Mouth Rinse PRN Dessa Phi, DO       sertraline (ZOLOFT) tablet 50 mg  50 mg Oral Daily Suella Broad, FNP   50 mg at 11/25/22 0903   sodium chloride flush (NS)  0.9 % injection 3 mL  3 mL Intravenous Q12H Tu, Ching T, DO   3 mL at 11/24/22 2126   sodium chloride flush (NS) 0.9 % injection 3 mL  3 mL Intravenous PRN Tu, Ching T, DO        Musculoskeletal: Strength & Muscle Tone: within normal limits Gait & Station: normal Patient leans: N/A       Psychiatric Specialty Exam:  Presentation  General Appearance:  Appropriate for Environment; Disheveled  Eye Contact: Good  Speech: Clear and Coherent; Normal Rate  Speech Volume: Normal  Handedness: Right   Mood and Affect  Mood: Labile; Anxious  Affect: Congruent; Labile; Tearful   Thought Process  Thought Processes: Coherent; Linear  Descriptions of Associations:Intact  Orientation:Full (Time, Place and Person)  Thought Content:Logical  History of Schizophrenia/Schizoaffective disorder:No Duration of Psychotic Symptoms:N/A Hallucinations:Hallucinations: None  Ideas of Reference:None  Suicidal Thoughts: none reported  Homicidal Thoughts: denies   Sensorium  Memory: Immediate Fair; Recent Fair; Remote Fair  Judgment: Fair  Insight: Fair   Community education officer   Concentration: Fair  Attention Span: Fair  Recall: [employer] of Knowledge: Fair  Language: Fair   Psychomotor Activity  Psychomotor Activity: Psychomotor Activity: Normal   Assets  Assets: Desire for Improvement; Communication Skills; Leisure Time; Physical Health   Sleep  Sleep: Sleep: Fair   Physical Exam: Physical Exam Vitals and nursing note reviewed.  Constitutional:      Appearance: Normal appearance. She is normal weight.  HENT:     Head: Normocephalic.  Musculoskeletal:        General: Normal range of motion.  Skin:    General: Skin is warm.     Capillary Refill: Capillary refill takes less than 2 seconds.  Neurological:     General: No focal deficit present.     Mental Status: She is alert and oriented to person, place, and time. Mental status is at baseline.  Psychiatric:        Attention and Perception: Attention and perception normal.        Mood and Affect: Mood normal. Affect is labile (some emotional overreaction).        Speech: Speech normal.        Behavior: Behavior normal. Behavior is cooperative.        Thought Content: Thought content normal.        Cognition and Memory: Cognition and memory normal.        Judgment: Judgment normal.    Review of Systems  Psychiatric/Behavioral:  Positive for depression and substance abuse. The patient is nervous/anxious.    Blood pressure 118/75, pulse 77, temperature 98.1 F (36.7 C), temperature source Oral, resp. rate 16, height '5\' 6"'$  (1.676 m), weight 115.5 kg, SpO2 100 %. Body mass index is 41.49 kg/m.  71 year old female with absent psychiatric history, undiagnosed psychiatric history, who presents on today's evaluation with symptoms consistent with delirium tremens, complication from alcohol withdrawal.  We have provided recommendations throughout course of admission, and will continue to monitor. See below for current recommendations and ongoing management.    Treatment Plan  Summary: Daily contact with patient to assess and evaluate symptoms and progress in treatment, Medication management, and Plan    Plan/Recommendations:  Continue CIWA protocol  Dc Ativan detox  Folate, vitamin B12; Thiamine levels pending. Thiamine 500 mg IV x 3 days. Completed  Continue folic acid supplementation EKG, QTc 462  Will continue Haldol prn agitation, psychosis and delusions.  -Continue Synthroid tablet 115mg  po daily -Consult TOC for placement in inpatient drug/alcohol rehabilitation facility.  Family prefers door to door transfer.  -Continue Zoloft 50 mg po daily for depression/anxiety. Will need adjustment in an outpatient setting as she is on (1) SSRI and (1) SNRI.  Disposition:  -Recommend referral to drug/alcohol rehab facility agreed to by patient and family (Husband) after patient is medically cleared.  -Psychiatric service will sign off at this time.   Suella Broad, FNP 11/25/2022 12:33 PM

## 2022-11-25 NOTE — Progress Notes (Signed)
Patient very impulsive, not able to be redirected. RN and IV team has inserted two PIVs in the past hour, patient removed both.

## 2022-11-26 DIAGNOSIS — I5033 Acute on chronic diastolic (congestive) heart failure: Secondary | ICD-10-CM | POA: Diagnosis not present

## 2022-11-26 DIAGNOSIS — J9601 Acute respiratory failure with hypoxia: Secondary | ICD-10-CM | POA: Diagnosis not present

## 2022-11-26 DIAGNOSIS — F1010 Alcohol abuse, uncomplicated: Secondary | ICD-10-CM | POA: Diagnosis not present

## 2022-11-26 DIAGNOSIS — I10 Essential (primary) hypertension: Secondary | ICD-10-CM | POA: Diagnosis not present

## 2022-11-26 MED ORDER — HYDROXYZINE HCL 25 MG PO TABS
25.0000 mg | ORAL_TABLET | Freq: Four times a day (QID) | ORAL | Status: DC | PRN
  Administered 2022-11-27: 25 mg via ORAL
  Filled 2022-11-26: qty 1

## 2022-11-26 MED ORDER — LOPERAMIDE HCL 2 MG PO CAPS: 2.0000 mg | ORAL_CAPSULE | ORAL | Status: DC | PRN

## 2022-11-26 MED ORDER — CHLORDIAZEPOXIDE HCL 25 MG PO CAPS: 25.0000 mg | ORAL_CAPSULE | Freq: Four times a day (QID) | ORAL | Status: DC | PRN

## 2022-11-26 MED ORDER — ADULT MULTIVITAMIN W/MINERALS CH
1.0000 | ORAL_TABLET | Freq: Every day | ORAL | Status: DC
  Administered 2022-11-26 – 2022-11-27 (×2): 1 via ORAL
  Filled 2022-11-26 (×2): qty 1

## 2022-11-26 MED ORDER — CHLORDIAZEPOXIDE HCL 25 MG PO CAPS: 25.0000 mg | ORAL_CAPSULE | ORAL | Status: DC

## 2022-11-26 MED ORDER — CHLORDIAZEPOXIDE HCL 25 MG PO CAPS
50.0000 mg | ORAL_CAPSULE | Freq: Once | ORAL | Status: AC
  Administered 2022-11-26: 50 mg via ORAL
  Filled 2022-11-26: qty 2

## 2022-11-26 MED ORDER — ONDANSETRON 4 MG PO TBDP: 4.0000 mg | ORAL_TABLET | Freq: Four times a day (QID) | ORAL | Status: DC | PRN

## 2022-11-26 MED ORDER — CHLORDIAZEPOXIDE HCL 25 MG PO CAPS
25.0000 mg | ORAL_CAPSULE | Freq: Three times a day (TID) | ORAL | Status: DC
  Administered 2022-11-27: 25 mg via ORAL
  Filled 2022-11-26: qty 1

## 2022-11-26 MED ORDER — CHLORDIAZEPOXIDE HCL 25 MG PO CAPS
25.0000 mg | ORAL_CAPSULE | Freq: Four times a day (QID) | ORAL | Status: AC
  Administered 2022-11-26 – 2022-11-27 (×4): 25 mg via ORAL
  Filled 2022-11-26 (×4): qty 1

## 2022-11-26 MED ORDER — CHLORDIAZEPOXIDE HCL 25 MG PO CAPS: 25.0000 mg | ORAL_CAPSULE | Freq: Every day | ORAL | Status: DC

## 2022-11-26 MED ORDER — MELATONIN 5 MG PO TABS: 5.0000 mg | ORAL_TABLET | Freq: Once | ORAL | Status: DC

## 2022-11-26 NOTE — Plan of Care (Signed)
Patient confused having anxiety, up to chair then transfers to bed,

## 2022-11-26 NOTE — Progress Notes (Signed)
Physical Therapy Treatment Patient Details Name: JINX GILDEN MRN: [id number] DOB: 10/01/1951 Today's Date: 11/26/2022   History of Present Illness [name] is a 71 y.o. female admitted to the hospital with hypoxia & was found to have acute hypoxemic and hypercarbic respiratory failure and heart failure. PMH: HTN, pre-diabetes, hypothyroidism, NAFLD, Hx of right breast cancer s/p lumpectomy, adjuvant chemotherapy and radiation, persistent atrial fibrillation .    PT Comments    Pt declined ambulating in hallway as she had already walked this morning (per RN found wandering in hallway earlier alone).  Pt able to mobilize around room and use bathroom at supervision level.      Recommendations for follow up therapy are one component of a multi-disciplinary discharge planning process, led by the attending physician.  Recommendations may be updated based on patient status, additional functional criteria and insurance authorization.  Follow Up Recommendations  Home health PT     Assistance Recommended at Discharge Intermittent Supervision/Assistance  Patient can return home with the following Assistance with cooking/housework;Assist for transportation;Help with stairs or ramp for entrance   Equipment Recommendations  None recommended by PT    Recommendations for Other Services       Precautions / Restrictions Precautions Precautions: Fall Restrictions Weight Bearing Restrictions: No     Mobility  Bed Mobility               General bed mobility comments: pt in recliner    Transfers Overall transfer level: Needs assistance Equipment used: None Transfers: Sit to/from Stand Sit to Stand: Supervision           General transfer comment: supervision for safety, no physical assist required    Ambulation/Gait Ambulation/Gait assistance: Min guard, Supervision Gait Distance (Feet): 15 Feet Assistive device: None Gait Pattern/deviations: Decreased stride  length, Step-through pattern       General Gait Details: pt ambulated around room looking for objects and using bathroom (did not need assist with toileting); SPO2 94% on room air upon return to recliner   Stairs             Wheelchair Mobility    Modified Rankin (Stroke Patients Only)       Balance Overall balance assessment: Needs assistance         Standing balance support: No upper extremity supported Standing balance-Leahy Scale: Good                              Cognition Arousal/Alertness: Awake/alert Behavior During Therapy: Anxious Overall Cognitive Status: Within Functional Limits for tasks assessed                                 General Comments: states they won't let her out of here, awaiting a friend's visit        Exercises      General Comments        Pertinent Vitals/Pain Pain Assessment Pain Assessment: No/denies pain    Home Living                          Prior Function            PT Goals (current goals can now be found in the care plan section) Progress towards PT goals: Progressing toward goals    Frequency    Min 3X/week  PT Plan Current plan remains appropriate    Co-evaluation              AM-PAC PT "6 Clicks" Mobility   Outcome Measure  Help needed turning from your back to your side while in a flat bed without using bedrails?: None Help needed moving from lying on your back to sitting on the side of a flat bed without using bedrails?: None Help needed moving to and from a bed to a chair (including a wheelchair)?: A Little Help needed standing up from a chair using your arms (e.g., wheelchair or bedside chair)?: A Little Help needed to walk in hospital room?: A Little Help needed climbing 3-5 steps with a railing? : A Little 6 Click Score: 20    End of Session   Activity Tolerance: Patient tolerated treatment well Patient left: in chair;with call bell/phone  within reach Nurse Communication: Mobility status (notified pt was in recliner on arrival without chair alarm) PT Visit Diagnosis: Difficulty in walking, not elsewhere classified (R26.2)     Time: 1000-1015 PT Time Calculation (min) (ACUTE ONLY): 15 min  Charges:  $Gait Training: 8-22 mins                    Arlyce Dice, DPT Physical Therapist Acute Rehabilitation Services Preferred contact method: Secure Chat Weekend Pager Only: [phone] Office: [place] 11/26/2022, 11:56 AM

## 2022-11-26 NOTE — Progress Notes (Signed)
PROGRESS NOTE    [name]  YFV:[id number] DOB: 07/08/1951 DOA: 11/17/2022 PCP: Bridget Hartshorn, NP     Brief Narrative:  [name] is a 71 y.o. female with medical history significant of HTN, pre-diabetes, hypothyroidism, NAFLD, Hx of right breast cancer s/p lumpectomy, adjuvant chemotherapy, radiation, herceptin maintenance and currently on antiestrogen therapy, persistent atrial fibrillation on Eliquis who presents with hypoxia.  Patient presented with chief complaints of wheezing, productive cough, difficulty sleeping, increasing lower extremity edema.  In the emergency department, she was requiring 3 L nasal cannula O2.  Chest x-ray with pulmonary congestion.  Patient was started on IV Lasix and admitted to the hospital.  Due to elevation in pCO2, patient was placed on BiPAP and admitted to stepdown unit.  Patient had improvement in mentation and was weaned off BiPAP. Hospitalization further complicated by alcohol withdrawal.  Overall withdrawal issues appear to be doing better.  She has been medically and psychiatrically cleared for discharge pending placement at residential alcohol rehab program  New events last 24 hours / Subjective: She says she feels well.  Wants to go home.  Assessment & Plan:   Principal Problem:   Acute respiratory failure with hypoxia and hypercapnia (HCC) Active Problems:   Hypothyroidism   Chronic back pain   Alcohol abuse   Benign essential HTN   Malignant neoplasm of upper-inner quadrant of right breast in female, estrogen receptor positive (HCC)   Hypokalemia   Morbid obesity (HCC)   Acute on chronic diastolic (congestive) heart failure (HCC)   Pulmonary edema   Generalized anxiety disorder   Alcohol abuse with alcohol-induced mood disorder (HCC)   Alcohol abuse with alcohol-induced anxiety disorder (HCC)   Acute hypoxemic and hypercarbic respiratory failure -SpO2 documented as low as 82% on 2L O2  -Due to her somnolence, ABG  was obtained which revealed hypercarbia with pCO2 88, pO2 70  -COVID, influenza negative -CTA chest negative for PE -Patient was started on BiPAP, now receiving BiPAP nightly -Overall pCO2 has trended down, pH has improved -Suspect respiratory failure precipitated by decompensated CHF -Suspect she definitely has a chronic component of hypercapnic respiratory failure.  Her husband does not report any history of COPD or other underlying lung disease.  She has not had any recent sleep studies.  Will likely need to follow-up with pulmonology regarding further workup -She has not had BiPAP and overall pCO2/pH are stable -She is currently on room air  Acute on chronic diastolic heart failure -Strict I's and O's, daily weight -Echocardiogram revealed EF 60 to 65%, no regional wall motion abnormality, mild LVH -Improved, now on p.o. Lasix  Alcohol abuse -CIWA protocol -Currently on Librium protocol -Seen by psychiatry with recommendations for an inpatient alcohol rehab program.  Patient and family are agreeable to placement. -Family wanted patient to go to [facility], unfortunately facility reviewed patient status and it was felt that she did not meet criteria for admission and felt she wanted to mobility issues.  Facility would be willing to reconsider if her mobility further improves  -Family may be willing to take patient home for now and retry [name] as her functional status improves  A-fib -Coreg, Eliquis  Demand ischemia -Secondary to above, respiratory failure, heart failure -Troponin trend 45, 48  Hypertension -Coreg, Cozaar  Hypothyroidism -Synthroid  Malignant neoplasm right breast -Status postlumpectomy, chemo and radiation -Followed by Dr. Lindi Adie -Anastrozole   Depression -Appreciate Psych eval -Started on Zoloft -She has been cleared for discharge from psychiatric  standpoint and would benefit from outpatient follow-up    DVT prophylaxis:  apixaban  (ELIQUIS) tablet 5 mg  Code Status: Full Family Communication: Discussed with patient's daughter 12/8 Disposition Plan:  Status is: Inpatient Remains inpatient appropriate because: Alcohol withdrawal   Antimicrobials:  Anti-infectives (From admission, onward)    None        Objective: Vitals:   11/26/22 1317 11/26/22 1821 11/26/22 1822 11/26/22 2001  BP:  (!) 148/90 (!) 148/90 139/86  Pulse: 61 64 67 68  Resp: 20   19  Temp: 98 F (36.7 C)  97.9 F (36.6 C) 98.3 F (36.8 C)  TempSrc: Oral  Oral Oral  SpO2: 98%  95%   Weight:      Height:        Intake/Output Summary (Last 24 hours) at 11/26/2022 2125 Last data filed at 11/26/2022 0400 Gross per 24 hour  Intake --  Output 300 ml  Net -300 ml   Filed Weights   11/22/22 0358 11/25/22 0500 11/26/22 0437  Weight: 115.3 kg 115.5 kg 118.5 kg   Examination: General exam: Appears to be calm, without distress Respiratory system: Diminished breath sounds, no respiratory distress Cardiovascular system: S1 & S2 heard, RRR.  No pedal edema. Gastrointestinal system: Abdomen is nondistended, soft and nontender. Normal bowel sounds heard. Central nervous system: Alert, no focal deficits Extremities: Symmetric in appearance bilaterally  Skin: No rashes, lesions or ulcers on exposed skin  Psychiatry: Pleasant, mildly anxious  Data Reviewed: I have personally reviewed following labs and imaging studies  CBC: Recent Labs  Lab 11/25/22 0421  WBC 6.6  HGB 12.3  HCT 40.1  MCV 105.8*  PLT 235   Basic Metabolic Panel: Recent Labs  Lab 11/20/22 0313 11/21/22 0329 11/22/22 0332 11/23/22 0420 11/24/22 0411 11/25/22 0421  NA 145 145 145 145 144 144  K 3.5 3.2* 3.1* 4.0 3.6 3.5  CL 100 95* 96* 101 99 100  CO2 36* 42* 39* 37* 36* 33*  GLUCOSE 127* 141* 132* 125* 122* 124*  BUN 24* 28* 25* '22 23 23  '$ CREATININE 0.71 0.73 0.76 0.65 0.68 0.75  CALCIUM 8.3* 8.6* 8.8* 8.7* 9.0 8.8*  MG 1.9  --  1.9  --   --   --     GFR: Estimated Creatinine Clearance: 82.1 mL/min (by C-G formula based on SCr of 0.75 mg/dL). Liver Function Tests: No results for input(s): "AST", "ALT", "ALKPHOS", "BILITOT", "PROT", "ALBUMIN" in the last 168 hours. No results for input(s): "LIPASE", "AMYLASE" in the last 168 hours. No results for input(s): "AMMONIA" in the last 168 hours. Coagulation Profile: No results for input(s): "INR", "PROTIME" in the last 168 hours. Cardiac Enzymes: No results for input(s): "CKTOTAL", "CKMB", "CKMBINDEX", "TROPONINI" in the last 168 hours. BNP (last 3 results) No results for input(s): "PROBNP" in the last 8760 hours. HbA1C: No results for input(s): "HGBA1C" in the last 72 hours. CBG: No results for input(s): "GLUCAP" in the last 168 hours. Lipid Profile: No results for input(s): "CHOL", "HDL", "LDLCALC", "TRIG", "CHOLHDL", "LDLDIRECT" in the last 72 hours. Thyroid Function Tests: No results for input(s): "TSH", "T4TOTAL", "FREET4", "T3FREE", "THYROIDAB" in the last 72 hours. Anemia Panel: No results for input(s): "VITAMINB12", "FOLATE", "FERRITIN", "TIBC", "IRON", "RETICCTPCT" in the last 72 hours.  Sepsis Labs: No results for input(s): "PROCALCITON", "LATICACIDVEN" in the last 168 hours.  Recent Results (from the past 240 hour(s))  Resp Panel by RT-PCR (Flu A&B, Covid) Anterior Nasal Swab     Status: None  Collection Time: 11/17/22  3:16 PM   Specimen: Anterior Nasal Swab  Result Value Ref Range Status   SARS Coronavirus 2 by RT PCR NEGATIVE NEGATIVE Final    Comment: (NOTE) SARS-CoV-2 target nucleic acids are NOT DETECTED.  The SARS-CoV-2 RNA is generally detectable in upper respiratory specimens during the acute phase of infection. The lowest concentration of SARS-CoV-2 viral copies this assay can detect is 138 copies/mL. A negative result does not preclude SARS-Cov-2 infection and should not be used as the sole basis for treatment or other patient management decisions. A  negative result may occur with  improper specimen collection/handling, submission of specimen other than nasopharyngeal swab, presence of viral mutation(s) within the areas targeted by this assay, and inadequate number of viral copies(<138 copies/mL). A negative result must be combined with clinical observations, patient history, and epidemiological information. The expected result is Negative.  Fact Sheet for Patients:  EntrepreneurPulse.com.au  Fact Sheet for Healthcare Providers:  IncredibleEmployment.be  This test is no t yet approved or cleared by the [place] FDA and  has been authorized for detection and/or diagnosis of SARS-CoV-2 by FDA under an Emergency Use Authorization (EUA). This EUA will remain  in effect (meaning this test can be used) for the duration of the COVID-19 declaration under Section 564(b)(1) of the Act, 21 U.S.C.section 360bbb-3(b)(1), unless the authorization is terminated  or revoked sooner.       Influenza A by PCR NEGATIVE NEGATIVE Final   Influenza B by PCR NEGATIVE NEGATIVE Final    Comment: (NOTE) The Xpert Xpress SARS-CoV-2/FLU/RSV plus assay is intended as an aid in the diagnosis of influenza from Nasopharyngeal swab specimens and should not be used as a sole basis for treatment. Nasal washings and aspirates are unacceptable for Xpert Xpress SARS-CoV-2/FLU/RSV testing.  Fact Sheet for Patients: EntrepreneurPulse.com.au  Fact Sheet for Healthcare Providers: IncredibleEmployment.be  This test is not yet approved or cleared by the [place] FDA and has been authorized for detection and/or diagnosis of SARS-CoV-2 by FDA under an Emergency Use Authorization (EUA). This EUA will remain in effect (meaning this test can be used) for the duration of the COVID-19 declaration under Section 564(b)(1) of the Act, 21 U.S.C. section 360bbb-3(b)(1), unless the authorization  is terminated or revoked.  Performed at [facility], [place] [address], [place], [place] [address]   MRSA Next Gen by PCR, Nasal     Status: None   Collection Time: 11/19/22  6:42 AM   Specimen: Nasal Mucosa; Nasal Swab  Result Value Ref Range Status   MRSA by PCR Next Gen NOT DETECTED NOT DETECTED Final    Comment: (NOTE) The GeneXpert MRSA Assay (FDA approved for NASAL specimens only), is one component of a comprehensive MRSA colonization surveillance program. It is not intended to diagnose MRSA infection nor to guide or monitor treatment for MRSA infections. Test performance is not FDA approved in patients less than 59 years old. Performed at [facility], [place] [address], [place], [place] [address]       Radiology Studies: No results found.    Scheduled Meds:  anastrozole  1 mg Oral Daily   apixaban  5 mg Oral BID   carvedilol  12.5 mg Oral BID   chlordiazePOXIDE  25 mg Oral QID   Followed by   Derrill Memo ON 11/27/2022] chlordiazePOXIDE  25 mg Oral TID   Followed by   Derrill Memo ON 11/28/2022] chlordiazePOXIDE  25 mg Oral BH-qamhs   Followed by   [  START ON 11/29/2022] chlordiazePOXIDE  25 mg Oral Daily   folic acid  1 mg Oral Daily   furosemide  40 mg Oral Daily   levothyroxine  175 mcg Oral QAC breakfast   losartan  50 mg Oral Daily   melatonin  5 mg Oral Once   multivitamin with minerals  1 tablet Oral Daily   multivitamin with minerals  1 tablet Oral Daily   sertraline  50 mg Oral Daily   sodium chloride flush  3 mL Intravenous Q12H   Continuous Infusions:  sodium chloride Stopped (11/24/22 1355)     LOS: 9 days     Kathie Dike, MD [facility] 11/26/2022, 9:25 PM   Available via Epic secure chat 7am-7pm After these hours, please refer to coverage provider listed on [email or web address]

## 2022-11-26 NOTE — Progress Notes (Signed)
Mobility Specialist - Progress Note   11/26/22 0859  Mobility  Activity Ambulated with assistance to bathroom  Level of Assistance Contact guard assist, steadying assist  Assistive Device None  Distance Ambulated (ft) 10 ft  Range of Motion/Exercises Active  Activity Response Tolerated well  Mobility Referral Yes  $Mobility charge 1 Mobility   Pt was found getting up from bed and asking for assistance to the bathroom. Pt ambulated to and back to bed. Was left in bed with all necessities in reach.  Ferd Hibbs Mobility Specialist

## 2022-11-26 NOTE — TOC Progression Note (Signed)
Transition of Care [facility]) - Progression Note    Patient Details  Name: [name] MRN: [id number] Date of Birth: [birth year]  [facility] CM/SW Contact  Leeroy Cha, RN Phone Number: 11/26/2022, 9:27 AM  Clinical Narrative:    [facility]. [facility]-josh, patient does not meet criteria for admission to [facility] due to mobility problems and would be in a private room.  Pt is to acute at this point.  If she improves in her mobility and being able to to her own adls will reconsider.  Per Josh family is aware. Tct-daughter-gave the daughter number to call to [facility] which is a inpatient free program here in town and to [facility].  Daughter really did not want to talk  Expected Discharge Plan: Home/Self Care Barriers to Discharge: Continued Medical Work up  Expected Discharge Plan and Services Expected Discharge Plan: Home/Self Care   Discharge Planning Services: CM Consult   Living arrangements for the past 2 months: Single Family Home                                       Social Determinants of Health (SDOH) Interventions    Readmission Risk Interventions   No data to display

## 2022-11-27 DIAGNOSIS — E039 Hypothyroidism, unspecified: Secondary | ICD-10-CM | POA: Diagnosis not present

## 2022-11-27 DIAGNOSIS — J9601 Acute respiratory failure with hypoxia: Secondary | ICD-10-CM | POA: Diagnosis not present

## 2022-11-27 DIAGNOSIS — F1010 Alcohol abuse, uncomplicated: Secondary | ICD-10-CM | POA: Diagnosis not present

## 2022-11-27 DIAGNOSIS — I5033 Acute on chronic diastolic (congestive) heart failure: Secondary | ICD-10-CM | POA: Diagnosis not present

## 2022-11-27 MED ORDER — SERTRALINE HCL 50 MG PO TABS: 50.0000 mg | ORAL_TABLET | Freq: Every day | ORAL | 1 refills | Status: DC

## 2022-11-27 MED ORDER — CHLORDIAZEPOXIDE HCL 25 MG PO CAPS: ORAL_CAPSULE | ORAL | 0 refills | Status: DC

## 2022-11-27 MED ORDER — FUROSEMIDE 20 MG PO TABS: 40.0000 mg | ORAL_TABLET | Freq: Every day | ORAL | 0 refills | Status: DC

## 2022-11-27 NOTE — Discharge Summary (Signed)
Physician Discharge Summary  [name] EAV:[id number] DOB: [birth year] DOA: 11/17/2022  PCP: Bridget Hartshorn, NP  Admit date: 11/17/2022 Discharge date: 11/27/2022  Admitted From: Home Disposition: Home  Recommendations for Outpatient Follow-up:  Follow up with PCP in 1-2 weeks Please obtain BMP/CBC in one week Outpatient referral to psychiatry Outpatient referral to pulmonology to be considered for sleep study and further evaluation of chronic hypercapnia Follow-up with primary cardiologist, Dr. Davina Poke in the next 1 to 2 weeks Patient plans on reengaging with [name] in the next 1 to 2 weeks to enroll in their alcohol rehab program  Home Health: Home health PT, OT Equipment/Devices:  Discharge Condition: Stable CODE STATUS: Full code Diet recommendation: Heart healthy  Brief/Interim Summary: [name] is a 71 y.o. female with medical history significant of HTN, pre-diabetes, hypothyroidism, NAFLD, Hx of right breast cancer s/p lumpectomy, adjuvant chemotherapy, radiation, herceptin maintenance and currently on antiestrogen therapy, persistent atrial fibrillation on Eliquis who presents with hypoxia.  Patient presented with chief complaints of wheezing, productive cough, difficulty sleeping, increasing lower extremity edema.  In the emergency department, she was requiring 3 L nasal cannula O2.  Chest x-ray with pulmonary congestion.  Patient was started on IV Lasix and admitted to the hospital.  Due to elevation in pCO2, patient was placed on BiPAP and admitted to stepdown unit.  Patient had improvement in mentation and was weaned off BiPAP. Hospitalization further complicated by alcohol withdrawal.  Overall withdrawal issues appear to be doing better.  She has been medically and psychiatrically cleared for discharge pending placement at residential alcohol rehab program   Discharge Diagnoses:  Principal Problem:   Acute respiratory failure with hypoxia and  hypercapnia (HCC) Active Problems:   Hypothyroidism   Chronic back pain   Alcohol abuse   Benign essential HTN   Malignant neoplasm of upper-inner quadrant of right breast in female, estrogen receptor positive ([place])   Hypokalemia   Morbid obesity ([place])   Acute on chronic diastolic (congestive) heart failure (HCC)   Pulmonary edema   Generalized anxiety disorder   Alcohol abuse with alcohol-induced mood disorder (HCC)   Alcohol abuse with alcohol-induced anxiety disorder (HCC)  Acute hypoxemic and hypercarbic respiratory failure -SpO2 documented as low as 82% on 2L O2  -Due to her somnolence, ABG was obtained which revealed hypercarbia with pCO2 88, pO2 70  -COVID, influenza negative -CTA chest negative for PE -Patient was started on BiPAP, now receiving BiPAP nightly -Overall pCO2 has trended down, pH has improved -Suspect respiratory failure precipitated by decompensated CHF -Suspect she has a chronic component of hypercapnic respiratory failure.  Her husband does not report any history of COPD or other underlying lung disease.  She has not had any recent sleep studies.  Will likely need to follow-up with pulmonology regarding further workup -She was able to wean off BiPAP and overall pCO2/pH remained stable -She is currently on room air -Will refer outpatient to pulmonology to be considered for sleep study   Acute on chronic diastolic heart failure -Strict I's and O's, daily weight -Echocardiogram revealed EF 60 to 65%, no regional wall motion abnormality, mild LVH -Appears to be approaching euvolemia -Now on oral Lasix -Follow-up with primary cardiologist   Alcohol abuse -CIWA protocol -Currently on Librium taper and appears to be tolerating this very well -Seen by psychiatry with recommendations for an inpatient alcohol rehab program.  Patient and family are agreeable to placement. -Family wanted patient to go to [facility], unfortunately facility  reviewed patient  status and it was felt that she did not meet criteria for admission and felt she wanted to mobility issues.  Facility would be willing to reconsider if her mobility further improves  -Family may be willing to take patient home for now and retry [name] as her functional status improves -Discussed with patient and husband together at the bedside, that her husband will be responsible at managing her medications.  Patient was in agreement.  Also advised that she cannot resume drinking alcohol after discharge.  She was also in agreement with this.  I have also recommended that she should not be driving until she has completed her alcohol rehab program and has been cleared by her primary care physician.  Patient was agreeable.   A-fib -Coreg, Eliquis   Demand ischemia -Secondary to above, respiratory failure, heart failure -Troponin trend 45, 48   Hypertension -Coreg, Cozaar   Hypothyroidism -Synthroid   Malignant neoplasm right breast -Status postlumpectomy, chemo and radiation -Followed by Dr. Lindi Adie -Anastrozole    Depression -Appreciate Psych eval -Started on Zoloft -She has been cleared for discharge from psychiatric standpoint and would benefit from outpatient follow-up  Discharge Instructions  Discharge Instructions     Ambulatory referral to Psychiatry   Complete by: As directed    Establish outpatient care for anxiety, depression and alcohol use   Ambulatory referral to Pulmonology   Complete by: As directed    Chronic hypercapnia, possible sleep apnea   Reason for referral: Other   Diet - low sodium heart healthy   Complete by: As directed    Driving Restrictions   Complete by: As directed    No driving until cleared by primary care physician   Increase activity slowly   Complete by: As directed       Allergies as of 11/27/2022       Reactions   Zolpidem Tartrate Other (See Comments)   Hallucinations and Confusion Other reaction(s):  Hallucinations confusion        Medication List     STOP taking these medications    amitriptyline 25 MG tablet Commonly known as: ELAVIL   Calcium-Magnesium-Zinc 500-250-12.5 MG Tabs   celecoxib 200 MG capsule Commonly known as: CELEBREX   diazepam 10 MG tablet Commonly known as: VALIUM   tiZANidine 2 MG tablet Commonly known as: ZANAFLEX       TAKE these medications    acetaminophen 500 MG tablet Commonly known as: TYLENOL Take 1,000 mg by mouth every 6 (six) hours as needed for mild pain.   anastrozole 1 MG tablet Commonly known as: ARIMIDEX TAKE ONE TABLET BY MOUTH DAILY.   carvedilol 12.5 MG tablet Commonly known as: COREG Take 1 tablet (12.5 mg total) by mouth 2 (two) times daily.   chlordiazePOXIDE 25 MG capsule Commonly known as: LIBRIUM Take 1 tab po tid for 2 days then 1 tab po bid for 2 days then 1 tab po daily for 2 days   dicyclomine 10 MG capsule Commonly known as: BENTYL Take 10 mg by mouth 4 (four) times daily -  before meals and at bedtime.   DULoxetine 60 MG capsule Commonly known as: CYMBALTA Take 60 mg by mouth 2 (two) times daily.   Eliquis 5 MG Tabs tablet Generic drug: apixaban TAKE ONE TABLET BY MOUTH TWICE DAILY. What changed: how much to take   furosemide 20 MG tablet Commonly known as: LASIX Take 2 tablets (40 mg total) by mouth daily. What changed:  how  much to take when to take this reasons to take this   levothyroxine 175 MCG tablet Commonly known as: SYNTHROID Take 1 tablet (175 mcg total) by mouth daily.   losartan 50 MG tablet Commonly known as: COZAAR Take 50 mg by mouth daily.   Plenvu 140 g Solr Generic drug: PEG-KCl-NaCl-NaSulf-Na Asc-C Following the doctor's instructions, begin the evening before the procedure at 6pm   potassium chloride 10 MEQ tablet Commonly known as: KLOR-CON Take 10 mEq by mouth daily. May take 1 additional tablet ( 10 MeQ) if taking lasix.   sertraline 50 MG tablet Commonly  known as: ZOLOFT Take 1 tablet (50 mg total) by mouth daily. Start taking on: November 28, 2022   Vitamin D (Ergocalciferol) 1.25 MG (50000 UNIT) Caps capsule Commonly known as: DRISDOL Take 1 capsule (50,000 Units total) by mouth every 7 (seven) days.        Allergies  Allergen Reactions   Zolpidem Tartrate Other (See Comments)    Hallucinations and Confusion Other reaction(s): Hallucinations confusion     Consultations: Psychiatry   Procedures/Studies: ECHOCARDIOGRAM COMPLETE  Result Date: 11/18/2022    ECHOCARDIOGRAM REPORT   Patient Name:   ADAH STONEBERG Date of Exam: 11/18/2022 Medical Rec #:  [id number]        Height:       66.0 in Accession #:    [id number]       Weight:       250.0 lb Date of Birth:  03-26-1951        BSA:          2.199 m Patient Age:    71 years         BP:           154/103 mmHg Patient Gender: F                HR:           95 bpm. Exam Location:  Inpatient Procedure: 2D Echo, Cardiac Doppler and Color Doppler Indications:    I50.40* Unspecified combined systolic (congestive) and diastolic                 (congestive) heart failure  History:        Patient has prior history of Echocardiogram examinations, most                 recent 05/10/2022. CHF, Abnormal ECG, Signs/Symptoms:Dyspnea,                 Shortness of Breath and Edema; Risk Factors:Hypertension. ETOH.                 Breast cancer.  Sonographer:    Roseanna Rainbow RDCS Referring Phys: [id number] [place] T TU  Sonographer Comments: Technically difficult study due to poor echo windows and patient is obese. Image acquisition challenging due to patient body habitus. IMPRESSIONS  1. Left ventricular ejection fraction, by estimation, is 60 to 65%. The left ventricle has normal function. The left ventricle has no regional wall motion abnormalities. There is mild left ventricular hypertrophy of the septal segment. Left ventricular diastolic parameters are indeterminate.  2. Right ventricular systolic function is  normal. The right ventricular size is normal. There is mildly elevated pulmonary artery systolic pressure.  3. No evidence of mitral valve regurgitation.  4. The aortic valve is tricuspid. Aortic valve regurgitation is not visualized. Comparison(s): No significant change from prior study. FINDINGS  Left Ventricle: Left ventricular ejection fraction, by estimation,  is 60 to 65%. The left ventricle has normal function. The left ventricle has no regional wall motion abnormalities. The left ventricular internal cavity size was normal in size. There is  mild left ventricular hypertrophy of the septal segment. Left ventricular diastolic parameters are indeterminate. Right Ventricle: The right ventricular size is normal. Right ventricular systolic function is normal. There is mildly elevated pulmonary artery systolic pressure. The tricuspid regurgitant velocity is 2.58 m/s, and with an assumed right atrial pressure of 15 mmHg, the estimated right ventricular systolic pressure is 50.0 mmHg. Left Atrium: Left atrial size was normal in size. Right Atrium: Right atrial size was normal in size. Pericardium: There is no evidence of pericardial effusion. Mitral Valve: No evidence of mitral valve regurgitation. Tricuspid Valve: Tricuspid valve regurgitation is trivial. Aortic Valve: The aortic valve is tricuspid. Aortic valve regurgitation is not visualized. Pulmonic Valve: Pulmonic valve regurgitation is not visualized. Aorta: The aortic root and ascending aorta are structurally normal, with no evidence of dilitation. IAS/Shunts: No atrial level shunt detected by color flow Doppler.  LEFT VENTRICLE PLAX 2D LVIDd:         4.40 cm LVIDs:         2.80 cm LV PW:         1.80 cm LV IVS:        1.20 cm LVOT diam:     2.10 cm LV SV:         69 LV SV Index:   32 LVOT Area:     3.46 cm  LV Volumes (MOD) LV vol d, MOD A2C: 61.6 ml LV vol d, MOD A4C: 74.9 ml LV vol s, MOD A2C: 13.3 ml LV vol s, MOD A4C: 25.9 ml LV SV MOD A2C:     48.3 ml  LV SV MOD A4C:     74.9 ml LV SV MOD BP:      49.8 ml RIGHT VENTRICLE             IVC RV S prime:     10.60 cm/s  IVC diam: 2.40 cm RVOT diam:      3.00 cm TAPSE (M-mode): 1.3 cm LEFT ATRIUM             Index        RIGHT ATRIUM           Index LA diam:        4.40 cm 2.00 cm/m   RA Area:     18.30 cm LA Vol (A2C):   49.7 ml 22.60 ml/m  RA Volume:   46.90 ml  21.33 ml/m LA Vol (A4C):   43.0 ml 19.56 ml/m LA Biplane Vol: 50.0 ml 22.74 ml/m  AORTIC VALVE LVOT Vmax:   110.50 cm/s LVOT Vmean:  70.400 cm/s LVOT VTI:    0.200 m  AORTA Ao Root diam: 2.80 cm Ao Asc diam:  3.70 cm MITRAL VALVE                TRICUSPID VALVE MV Area (PHT): 3.12 cm     TR Peak grad:   26.6 mmHg MV Decel Time: 243 msec     TR Vmax:        258.00 cm/s MV E velocity: 113.00 cm/s                             SHUNTS  Systemic VTI:  0.20 m                             Systemic Diam: 2.10 cm                             Pulmonic Diam: 3.00 cm Phineas Inches Electronically signed by Phineas Inches Signature Date/Time: 11/18/2022/2:48:01 PM    Final    CT Angio Chest PE W and/or Wo Contrast  Result Date: 11/17/2022 CLINICAL DATA:  Shortness of breath on exertion with hypoxia, initial encounter EXAM: CT ANGIOGRAPHY CHEST WITH CONTRAST TECHNIQUE: Multidetector CT imaging of the chest was performed using the standard protocol during bolus administration of intravenous contrast. Multiplanar CT image reconstructions and MIPs were obtained to evaluate the vascular anatomy. RADIATION DOSE REDUCTION: This exam was performed according to the departmental dose-optimization program which includes automated exposure control, adjustment of the mA and/or kV according to patient size and/or use of iterative reconstruction technique. CONTRAST:  156m OMNIPAQUE IOHEXOL 350 MG/ML SOLN COMPARISON:  Chest x-ray from earlier in the same day. FINDINGS: Cardiovascular: Thoracic aorta shows atherosclerotic calcifications. No aneurysmal dilatation  is noted. No cardiac enlargement is seen. Coronary calcifications are seen. The pulmonary artery shows a normal branching pattern. No filling pulmonary embolism is noted. Mediastinum/Nodes: Thoracic inlet is within normal limits. No hilar or mediastinal adenopathy is noted. The esophagus is within normal limits. Lungs/Pleura: Lungs are well aerated bilaterally. No focal infiltrate or sizable effusion is seen. Mosaic attenuation is noted consistent with air trapping. No pneumothorax is seen. Upper Abdomen: Visualized upper abdomen demonstrates evidence of cholelithiasis without complicating factors. No other acute abnormality is seen. Musculoskeletal: No acute bony abnormality is noted. Review of the MIP images confirms the above findings. IMPRESSION: No evidence of pulmonary emboli. Cholelithiasis without complicating factors. Electronically Signed   By: MInez CatalinaM.D.   On: 11/17/2022 21:51   DG Chest 2 View  Result Date: 11/17/2022 CLINICAL DATA:  Sob EXAM: PORTABLE CHEST 1 VIEW COMPARISON:  04/08/2022 FINDINGS: Cardiac silhouette is prominent. There is pulmonary interstitial prominence with vascular congestion. No focal consolidation. No pneumothorax or pleural effusion identified. Osseous structures are osteopenic. There is a left shoulder prosthesis. IMPRESSION: Findings suggest CHF. Electronically Signed   By: JSammie BenchM.D.   On: 11/17/2022 16:24      Subjective: She is feeling better.  She is calm.  Denies any shortness of breath.  Able to have appropriate conversation.  Her husband also feels that she is less confused today and more herself.  Discharge Exam: Vitals:   11/26/22 1822 11/26/22 2001 11/27/22 0412 11/27/22 1402  BP: (!) 148/90 139/86 136/71 129/87  Pulse: 67 68 69 64  Resp:  '19 19 20  '$ Temp: 97.9 F (36.6 C) 98.3 F (36.8 C) 98.1 F (36.7 C) 97.9 F (36.6 C)  TempSrc: Oral Oral Oral Oral  SpO2: 95%  95% 95%  Weight:   113.9 kg   Height:        General: Pt is  alert, awake, not in acute distress Cardiovascular: RRR, S1/S2 +, no rubs, no gallops Respiratory: CTA bilaterally, no wheezing, no rhonchi Abdominal: Soft, NT, ND, bowel sounds + Extremities: no edema, no cyanosis    The results of significant diagnostics from this hospitalization (including imaging, microbiology, ancillary and laboratory) are listed below for reference.     Microbiology: Recent Results (from the  past 240 hour(s))  MRSA Next Gen by PCR, Nasal     Status: None   Collection Time: 11/19/22  6:42 AM   Specimen: Nasal Mucosa; Nasal Swab  Result Value Ref Range Status   MRSA by PCR Next Gen NOT DETECTED NOT DETECTED Final    Comment: (NOTE) The GeneXpert MRSA Assay (FDA approved for NASAL specimens only), is one component of a comprehensive MRSA colonization surveillance program. It is not intended to diagnose MRSA infection nor to guide or monitor treatment for MRSA infections. Test performance is not FDA approved in patients less than 11 years old. Performed at [facility], [place] [address] [place], [place] [address]      Labs: BNP (last 3 results) Recent Labs    04/08/22 1031 11/17/22 1516  BNP 103.5* 440.1*   Basic Metabolic Panel: Recent Labs  Lab 11/21/22 0329 11/22/22 0332 11/23/22 0420 11/24/22 0411 11/25/22 0421  NA 145 145 145 144 144  K 3.2* 3.1* 4.0 3.6 3.5  CL 95* 96* 101 99 100  CO2 42* 39* 37* 36* 33*  GLUCOSE 141* 132* 125* 122* 124*  BUN 28* 25* '22 23 23  '$ CREATININE 0.73 0.76 0.65 0.68 0.75  CALCIUM 8.6* 8.8* 8.7* 9.0 8.8*  MG  --  1.9  --   --   --    Liver Function Tests: No results for input(s): "AST", "ALT", "ALKPHOS", "BILITOT", "PROT", "ALBUMIN" in the last 168 hours. No results for input(s): "LIPASE", "AMYLASE" in the last 168 hours. No results for input(s): "AMMONIA" in the last 168 hours. CBC: Recent Labs  Lab 11/25/22 0421  WBC 6.6  HGB 12.3  HCT 40.1  MCV 105.8*  PLT 268   Cardiac  Enzymes: No results for input(s): "CKTOTAL", "CKMB", "CKMBINDEX", "TROPONINI" in the last 168 hours. BNP: Invalid input(s): "POCBNP" CBG: No results for input(s): "GLUCAP" in the last 168 hours. D-Dimer No results for input(s): "DDIMER" in the last 72 hours. Hgb A1c No results for input(s): "HGBA1C" in the last 72 hours. Lipid Profile No results for input(s): "CHOL", "HDL", "LDLCALC", "TRIG", "CHOLHDL", "LDLDIRECT" in the last 72 hours. Thyroid function studies No results for input(s): "TSH", "T4TOTAL", "T3FREE", "THYROIDAB" in the last 72 hours.  Invalid input(s): "FREET3" Anemia work up No results for input(s): "VITAMINB12", "FOLATE", "FERRITIN", "TIBC", "IRON", "RETICCTPCT" in the last 72 hours. Urinalysis    Component Value Date/Time   COLORURINE YELLOW 11/17/2022 [place] 11/17/2022 1516   LABSPEC 1.016 11/17/2022 1516   PHURINE 5.0 11/17/2022 1516   GLUCOSEU NEGATIVE 11/17/2022 1516   HGBUR NEGATIVE 11/17/2022 1516   BILIRUBINUR NEGATIVE 11/17/2022 1516   BILIRUBINUR neg 11/13/2013 1629   KETONESUR 5 (A) 11/17/2022 1516   PROTEINUR NEGATIVE 11/17/2022 1516   UROBILINOGEN negative 11/13/2013 1629   NITRITE NEGATIVE 11/17/2022 1516   LEUKOCYTESUR NEGATIVE 11/17/2022 1516   Sepsis Labs Recent Labs  Lab 11/25/22 0421  WBC 6.6   Microbiology Recent Results (from the past 240 hour(s))  MRSA Next Gen by PCR, Nasal     Status: None   Collection Time: 11/19/22  6:42 AM   Specimen: Nasal Mucosa; Nasal Swab  Result Value Ref Range Status   MRSA by PCR Next Gen NOT DETECTED NOT DETECTED Final    Comment: (NOTE) The GeneXpert MRSA Assay (FDA approved for NASAL specimens only), is one component of a comprehensive MRSA colonization surveillance program. It is not intended to diagnose MRSA infection nor to guide or monitor treatment for MRSA  infections. Test performance is not FDA approved in patients less than 54 years old. Performed at [facility], [place] [address] [place], [place] [address]      Time coordinating discharge: 55mns  SIGNED:   JKathie Dike MD  [facility] 11/27/2022, 9:14 PM   If 7PM-7AM, please contact night-coverage [email or web address]

## 2022-11-27 NOTE — Progress Notes (Signed)
Patient discharged home.  Discharge instructions explained to both patient and husband [name]), both verbalize understanding.

## 2022-11-27 NOTE — Plan of Care (Signed)
Assumed care at 0315. Patient alert and oriented. Afib on tele. No reports of pain and SOB. SBA assist. Bed alarm on. VS monitored. Safety and fall precautions observed. Call bell within reach.  Problem: Education: Goal: Knowledge of General Education information will improve Description: Including pain rating scale, medication(s)/side effects and non-pharmacologic comfort measures Outcome: Progressing   Problem: Health Behavior/Discharge Planning: Goal: Ability to manage health-related needs will improve Outcome: Progressing   Problem: Clinical Measurements: Goal: Ability to maintain clinical measurements within normal limits will improve Outcome: Progressing Goal: Will remain free from infection Outcome: Progressing   Problem: Nutrition: Goal: Adequate nutrition will be maintained Outcome: Progressing   Problem: Coping: Goal: Level of anxiety will decrease Outcome: Progressing   Problem: Pain Managment: Goal: General experience of comfort will improve Outcome: Progressing   Problem: Safety: Goal: Ability to remain free from injury will improve Outcome: Progressing   Problem: Skin Integrity: Goal: Risk for impaired skin integrity will decrease Outcome: Progressing

## 2022-11-28 LAB — VITAMIN B1: Vitamin B1 (Thiamine): 244.2 nmol/L — ABNORMAL HIGH (ref 66.5–200.0)

## 2022-11-30 NOTE — Progress Notes (Signed)
Restraints never started  due to making pt more restless. Family came to help.

## 2022-11-30 NOTE — Progress Notes (Signed)
Restraints never started

## 2022-12-01 NOTE — Progress Notes (Unsigned)
Cardiology Office Note:   Date:  12/08/2022  NAME:  [name]    MRN: [id number] DOB:  1951-03-02   PCP:  Bridget Hartshorn, NP  Cardiologist:  Evalina Field, MD  Electrophysiologist:  None   Referring MD: Jonathon Jordan, MD   Chief Complaint  Patient presents with   Follow-up        History of Present Illness:   [name] is a 71 y.o. female with a hx of persistent Afib, etoh abuse, obesity, HTN who presents for follow-up.  Recently admitted to the hospital for respiratory failure.  This was attributed diastolic heart failure.  Her BNP was not elevated.  Her chest CT had no evidence of pulmonary edema.  She also had alcohol withdrawal.  She has plans to go to alcohol rehabilitation this coming year.  She is back to normal.  Denies any chest pain or trouble breathing.  Her A-fib is rate controlled.  She does have bruising on Eliquis.  We discussed adding Aldactone for her fluid status.  She is euvolemic today.  She does have evidence of pitting edema in the lower extremities.  Denies any symptoms today.  Problem List 1. Stage IA Breast Cancer -ER+/PR+/HER2+ -lumpectomy 01/22/2020 -radiation 05/2020-06/2020 -s/p adjuvant chemo -completed trastuzumab  2. HTN 3. Obesity -BMI 36 4. Former Smoker -69 years  5. Persistent atrial fibrillation  -Dx 01/27/2021 -CHADVASC=3 (age, sex, HTN) 6. LLE edema -negative DVT study -likely 2/2 trauma  7. ETOH abuse   Past Medical History: Past Medical History:  Diagnosis Date   Anxiety    Atrial fibrillation [facility])    sees Dr. Audie Box   Bilateral swelling of feet    Cancer [facility])    breast   Chronic diarrhea    Chronic pain    Concussion 02/2007   ICU x 3 days   Depression    Hypertension    Hypothyroidism    Personal history of chemotherapy    Personal history of radiation therapy    Spinal stenosis    Thyroid disease ?1994   Vitamin D deficiency     Past Surgical History: Past Surgical History:  Procedure  Laterality Date   BREAST BIOPSY Right 12/20/2019   x2   BREAST LUMPECTOMY Right 01/22/2020   BREAST LUMPECTOMY WITH RADIOACTIVE SEED AND SENTINEL LYMPH NODE BIOPSY Right 01/22/2020   Procedure: RIGHT BREAST LUMPECTOMY WITH RADIOACTIVE SEED X2 AND RIGHT SENTINEL LYMPH NODE MAPPING;  Surgeon: Erroll Luna, MD;  Location: [place];  Service: General;  Laterality: Right;   BREAST SURGERY Right 03/1998   breast biopsy, benign   EYE SURGERY Right    PORT-A-CATH REMOVAL Right 04/30/2021   Procedure: REMOVAL PORT-A-CATH;  Surgeon: Erroll Luna, MD;  Location: [place];  Service: General;  Laterality: Right;   PORTACATH PLACEMENT Right 01/22/2020   Procedure: INSERTION PORT-A-CATH WITH ULTRASOUND;  Surgeon: Erroll Luna, MD;  Location: [employer];  Service: General;  Laterality: Right;   PORTACATH PLACEMENT Right 02/28/2020   Procedure: PORT A CATH REVISION;  Surgeon: Erroll Luna, MD;  Location: [place];  Service: General;  Laterality: Right;   REVERSE SHOULDER ARTHROPLASTY Left 02/02/2021   Procedure: LEFT REVERSE SHOULDER ARTHROPLASTY;  Surgeon: Meredith Pel, MD;  Location: [place];  Service: Orthopedics;  Laterality: Left;   SHOULDER SURGERY Right    SPINAL FUSION  03/04/2011   with ORIF    Current Medications: Current Meds  Medication Sig   acetaminophen (TYLENOL)  500 MG tablet Take 1,000 mg by mouth every 6 (six) hours as needed for mild pain.   anastrozole (ARIMIDEX) 1 MG tablet TAKE ONE TABLET BY MOUTH DAILY. (Patient taking differently: Take 1 mg by mouth daily.)   carvedilol (COREG) 12.5 MG tablet Take 1 tablet (12.5 mg total) by mouth 2 (two) times daily.   chlordiazePOXIDE (LIBRIUM) 25 MG capsule Take 1 tab po tid for 2 days then 1 tab po bid for 2 days then 1 tab po daily for 2 days   dicyclomine (BENTYL) 10 MG capsule Take 10 mg by mouth 4 (four) times daily -  before meals and at bedtime.   DULoxetine (CYMBALTA) 60 MG  capsule Take 60 mg by mouth 2 (two) times daily.   ELIQUIS 5 MG TABS tablet TAKE ONE TABLET BY MOUTH TWICE DAILY. (Patient taking differently: Take 5 mg by mouth 2 (two) times daily.)   furosemide (LASIX) 20 MG tablet Take 2 tablets (40 mg total) by mouth daily.   levothyroxine (SYNTHROID) 175 MCG tablet Take 1 tablet (175 mcg total) by mouth daily.   losartan (COZAAR) 50 MG tablet Take 50 mg by mouth daily.   PEG-KCl-NaCl-NaSulf-Na Asc-C (PLENVU) 140 g SOLR Following the doctor's instructions, begin the evening before the procedure at 6pm   potassium chloride (KLOR-CON) 10 MEQ tablet Take 10 mEq by mouth daily. May take 1 additional tablet ( 10 MeQ) if taking lasix.   sertraline (ZOLOFT) 50 MG tablet Take 1 tablet (50 mg total) by mouth daily.   spironolactone (ALDACTONE) 25 MG tablet Take 1 tablet (25 mg total) by mouth daily.   Vitamin D, Ergocalciferol, (DRISDOL) 1.25 MG (50000 UNIT) CAPS capsule Take 1 capsule (50,000 Units total) by mouth every 7 (seven) days.     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  Tobacco Use   Smoking status: Former    Years: 20.00    Types: Cigarettes    Quit date: 1980    Years since quitting: 43.9   Smokeless tobacco: Never  Vaping Use   Vaping Use: Never used  Substance and Sexual Activity   Alcohol use: Yes    Alcohol/week: 5.0 standard drinks of alcohol    Types: 5 Standard drinks or equivalent per week    Comment: wine   Drug use: No   Sexual activity: Not Currently    Partners: Male    Birth control/protection: Post-menopausal  Other Topics Concern   Not on file  Social History Narrative   Not on file   Social Determinants of Health   Financial Resource Strain: Not on file   Food Insecurity: No Food Insecurity (11/19/2022)   Hunger Vital Sign    Worried About Running Out of Food in the Last Year: Never true    Ran Out of Food in the Last Year: Never true  Transportation Needs: No Transportation Needs (11/19/2022)   PRAPARE - Hydrologist (Medical): No    Lack of Transportation (Non-Medical): No  Physical Activity: Not on file  Stress: Not on file  Social Connections: Not on file     Family History: The patient's family history includes Alcoholism in her father;  Anxiety disorder in her mother; Dementia in her mother; Depression in her mother; Diabetes in her father and sister; Hypertension in her sister; Stroke in her father; Thyroid disease in her mother. There is no history of Colon cancer, Colon polyps, Stomach cancer, or Esophageal cancer.  ROS:   All other ROS reviewed and negative. Pertinent positives noted in the HPI.     EKGs/Labs/Other Studies Reviewed:   The following studies were personally reviewed by me today:   TTE 11/18/2022  1. Left ventricular ejection fraction, by estimation, is 60 to 65%. The  left ventricle has normal function. The left ventricle has no regional  wall motion abnormalities. There is mild left ventricular hypertrophy of  the septal segment. Left ventricular  diastolic parameters are indeterminate.   2. Right ventricular systolic function is normal. The right ventricular  size is normal. There is mildly elevated pulmonary artery systolic  pressure.   3. No evidence of mitral valve regurgitation.   4. The aortic valve is tricuspid. Aortic valve regurgitation is not  visualized.   Recent Labs: 07/21/2022: TSH 16.500 11/17/2022: ALT 14; B Natriuretic Peptide 104.6 11/22/2022: Magnesium 1.9 11/25/2022: BUN 23; Creatinine, Ser 0.75; Hemoglobin 12.3; Platelets 268; Potassium 3.5; Sodium 144   Recent Lipid Panel    Component Value Date/Time   CHOL 271 (H) 07/21/2022 0910   TRIG 165 (H)  07/21/2022 0910   HDL 103 07/21/2022 0910   LDLCALC 140 (H) 07/21/2022 0910    Physical Exam:   VS:  BP (!) 146/82   Pulse 78   Ht _0  (1.702 m)   LMP  (LMP Unknown)   SpO2 92%   BMI 39.33 kg/m    Wt Readings from Last 3 Encounters:  11/27/22 251 lb 1.7 oz (113.9 kg)  09/21/22 250 lb (113.4 kg)  08/19/22 256 lb (116.1 kg)    General: Well nourished, well developed, in no acute distress Head: Atraumatic, normal size  Eyes: PEERLA, EOMI  Neck: Supple, no JVD Endocrine: No thryomegaly Cardiac: Normal S1, S2; irregular rhythm, no murmur Lungs: Clear to auscultation bilaterally, no wheezing, rhonchi or rales  Abd: Soft, nontender, no hepatomegaly  Ext: 1+ pitting edema in the lower extremities Musculoskeletal: No deformities, BUE and BLE strength normal and equal Skin: Warm and dry, no rashes   Neuro: Alert and oriented to person, place, time, and situation, CNII-XII grossly intact, no focal deficits  Psych: Normal mood and affect   ASSESSMENT:   [name] is a 71 y.o. female who presents for the following: 1. Persistent atrial fibrillation ([place])   2. Acquired thrombophilia ([place])   3. Primary hypertension   4. Chronic diastolic heart failure (HCC)     PLAN:   1. Persistent atrial fibrillation ([place]) 2. Acquired thrombophilia ([place]) -No symptoms for A-fib.  Recommend a rate control strategy.  Echo shows normal LV function.  She also suffers from alcohol abuse.  For now best option is rate control strategy given lack of symptoms.  She is on Eliquis 5 mg twice daily.  We discussed watchman procedure due to bruising and bleeding.  For now she would like to continue with this.  We will see her back in 6 months to discuss further.  3. Primary hypertension 4. Chronic diastolic heart failure (HCC) -Slightly elevated today.  Continue losartan 50 mg daily, Coreg 12.5 mg twice daily.  Continue Lasix 40 mg daily with 10 mEq of potassium.  Will add Aldactone 25 mg daily to help  with fluid  status.  Disposition: Return in about 6 months (around 06/09/2023).  Medication Adjustments/Labs and Tests Ordered: Current medicines are reviewed at length with the patient today.  Concerns regarding medicines are outlined above.  No orders of the defined types were placed in this encounter.  Meds ordered this encounter  Medications   spironolactone (ALDACTONE) 25 MG tablet    Sig: Take 1 tablet (25 mg total) by mouth daily.    Dispense:  90 tablet    Refill:  3    Patient Instructions  Medication Instructions:  START Aldactone 25 mg daily  *If you need a refill on your cardiac medications before your next appointment, please call your pharmacy*   Follow-Up: At [facility], you and your health needs are our priority.  As part of our continuing mission to provide you with exceptional heart care, we have created designated Provider Care Teams.  These Care Teams include your primary Cardiologist (physician) and Advanced Practice Providers (APPs -  Physician Assistants and Nurse Practitioners) who all work together to provide you with the care you need, when you need it.  We recommend signing up for the patient portal called "MyChart".  Sign up information is provided on this After Visit Summary.  MyChart is used to connect with patients for Virtual Visits (Telemedicine).  Patients are able to view lab/test results, encounter notes, upcoming appointments, etc.  Non-urgent messages can be sent to your provider as well.   To learn more about what you can do with MyChart, go to [email or web address].    Your next appointment:   6 month(s)  The format for your next appointment:   In Person  Provider:   Evalina Field, MD            Time Spent with Patient: I have spent a total of 35 minutes with patient reviewing hospital notes, telemetry, EKGs, labs and examining the patient as well as establishing an assessment and plan that was discussed with the  patient.  > 50% of time was spent in direct patient care.  Signed, Addison Naegeli. Audie Box, MD, [place]  [address] [place], [place] [address] [phone]  12/08/2022 3:27 PM

## 2022-12-08 ENCOUNTER — Ambulatory Visit: Payer: Medicare PPO | Attending: Cardiovascular Disease | Admitting: Cardiovascular Disease

## 2022-12-08 ENCOUNTER — Encounter: Payer: Self-pay | Admitting: Cardiovascular Disease

## 2022-12-08 VITALS — BP 146/82 | HR 78 | Ht 67.0 in

## 2022-12-08 DIAGNOSIS — D6869 Other thrombophilia: Secondary | ICD-10-CM

## 2022-12-08 DIAGNOSIS — I4819 Other persistent atrial fibrillation: Secondary | ICD-10-CM

## 2022-12-08 DIAGNOSIS — I10 Essential (primary) hypertension: Secondary | ICD-10-CM

## 2022-12-08 DIAGNOSIS — I5032 Chronic diastolic (congestive) heart failure: Secondary | ICD-10-CM

## 2022-12-08 MED ORDER — SPIRONOLACTONE 25 MG PO TABS: 25.0000 mg | ORAL_TABLET | Freq: Every day | ORAL | 3 refills | Status: DC

## 2022-12-08 NOTE — Patient Instructions (Signed)
Medication Instructions:  START Aldactone 25 mg daily  *If you need a refill on your cardiac medications before your next appointment, please call your pharmacy*   Follow-Up: At [facility], you and your health needs are our priority.  As part of our continuing mission to provide you with exceptional heart care, we have created designated Provider Care Teams.  These Care Teams include your primary Cardiologist (physician) and Advanced Practice Providers (APPs -  Physician Assistants and Nurse Practitioners) who all work together to provide you with the care you need, when you need it.  We recommend signing up for the patient portal called "MyChart".  Sign up information is provided on this After Visit Summary.  MyChart is used to connect with patients for Virtual Visits (Telemedicine).  Patients are able to view lab/test results, encounter notes, upcoming appointments, etc.  Non-urgent messages can be sent to your provider as well.   To learn more about what you can do with MyChart, go to [email or web address].    Your next appointment:   6 month(s)  The format for your next appointment:   In Person  Provider:   Evalina Field, MD

## 2022-12-23 ENCOUNTER — Other Ambulatory Visit (INDEPENDENT_AMBULATORY_CARE_PROVIDER_SITE_OTHER): Payer: Self-pay | Admitting: Family Medicine

## 2022-12-23 DIAGNOSIS — E038 Other specified hypothyroidism: Secondary | ICD-10-CM

## 2023-01-01 ENCOUNTER — Emergency Department (HOSPITAL_BASED_OUTPATIENT_CLINIC_OR_DEPARTMENT_OTHER): Payer: Medicare PPO | Admitting: Radiology

## 2023-01-01 ENCOUNTER — Encounter (HOSPITAL_BASED_OUTPATIENT_CLINIC_OR_DEPARTMENT_OTHER): Payer: Self-pay

## 2023-01-01 ENCOUNTER — Emergency Department (HOSPITAL_BASED_OUTPATIENT_CLINIC_OR_DEPARTMENT_OTHER)
Admission: EM | Admit: 2023-01-01 | Discharge: 2023-01-01 | Disposition: A | Discharge status: Home / Self Care | Payer: Medicare PPO | Attending: Emergency Medicine | Admitting: Emergency Medicine

## 2023-01-01 DIAGNOSIS — S52502A Unspecified fracture of the lower end of left radius, initial encounter for closed fracture: Secondary | ICD-10-CM | POA: Insufficient documentation

## 2023-01-01 DIAGNOSIS — Z7901 Long term (current) use of anticoagulants: Secondary | ICD-10-CM | POA: Insufficient documentation

## 2023-01-01 DIAGNOSIS — I10 Essential (primary) hypertension: Secondary | ICD-10-CM | POA: Insufficient documentation

## 2023-01-01 DIAGNOSIS — M25532 Pain in left wrist: Secondary | ICD-10-CM | POA: Diagnosis present

## 2023-01-01 DIAGNOSIS — Z79899 Other long term (current) drug therapy: Secondary | ICD-10-CM | POA: Insufficient documentation

## 2023-01-01 DIAGNOSIS — I4891 Unspecified atrial fibrillation: Secondary | ICD-10-CM | POA: Diagnosis not present

## 2023-01-01 DIAGNOSIS — W010XXA Fall on same level from slipping, tripping and stumbling without subsequent striking against object, initial encounter: Secondary | ICD-10-CM | POA: Insufficient documentation

## 2023-01-01 MED ORDER — HYDROCODONE-ACETAMINOPHEN 5-325 MG PO TABS: 1.0000 | ORAL_TABLET | Freq: Four times a day (QID) | ORAL | 0 refills | Status: DC | PRN

## 2023-01-01 MED ORDER — HYDROCODONE-ACETAMINOPHEN 5-325 MG PO TABS
1.0000 | ORAL_TABLET | Freq: Once | ORAL | Status: AC
  Administered 2023-01-01: 1 via ORAL
  Filled 2023-01-01: qty 1

## 2023-01-01 NOTE — Discharge Instructions (Addendum)
You are seen today for pain in the left wrist.  Your x-ray shows that your wrist is broken.  It is not badly displaced and did not need any reduction today.  You were placed in a splint and sling.  Follow-up with the hand specialist.  You can use the hydrocodone as needed for pain, come back to the ER if you have numbness or tingling, worsening pain, color change to your hands or fingers or any other worrisome changes.  Avoid alcohol or driving while taking the hydrocodone.

## 2023-01-01 NOTE — ED Notes (Signed)
Discharge instructions, follow up care with orthopedic doctor, and pain management, prescriptions reviewed and explained, pt verbalized understanding. Splint placed and assessed by PA. Ice packs provided. Pt taken to POV via wheelchair without incident.

## 2023-01-01 NOTE — ED Triage Notes (Signed)
She states she fell about 6 days ago (tripped; did not pass out). She c/o persistent pain and swelling of left distal forearm.

## 2023-01-01 NOTE — ED Provider Notes (Cosign Needed Addendum)
[facility] EMERGENCY DEPT Provider Note   CSN: [id number] Arrival date & time: 01/01/23  9326     History  Chief Complaint  Patient presents with   Arm Injury    [name] is a 72 y.o. female.  She has history of atrial fibrillation and is on Eliquis.  History of hypertension as well.  [facility] ER complaining of left wrist pain.  She states she fell approximately 3 days ago.  She stood up and lost her balance.  Denies dizziness or syncope, no head injury.  She states she caught herself on her left arm when she fell to the ground and has been having pain since then.  States initially it was only mild was been more severe.  She denies any numbness or tingling.  She able to move her fingers without difficulty.  She presents the ED today for further evaluation.  She denies any neck pain or other injuries.   Arm Injury      Home Medications Prior to Admission medications   Medication Sig Start Date End Date Taking? Authorizing Provider  acetaminophen (TYLENOL) 500 MG tablet Take 1,000 mg by mouth every 6 (six) hours as needed for mild pain.    [provider]  anastrozole (ARIMIDEX) 1 MG tablet TAKE ONE TABLET BY MOUTH DAILY. Patient taking differently: Take 1 mg by mouth daily. 04/20/22   Nicholas Lose, MD  carvedilol (COREG) 12.5 MG tablet Take 1 tablet (12.5 mg total) by mouth 2 (two) times daily. 06/15/22   [facility], Tami Lin, PA  chlordiazePOXIDE (LIBRIUM) 25 MG capsule Take 1 tab po tid for 2 days then 1 tab po bid for 2 days then 1 tab po daily for 2 days 11/27/22   Kathie Dike, MD  dicyclomine (BENTYL) 10 MG capsule Take 10 mg by mouth 4 (four) times daily -  before meals and at bedtime.    [provider]  DULoxetine (CYMBALTA) 60 MG capsule Take 60 mg by mouth 2 (two) times daily. 06/18/22   [provider]  ELIQUIS 5 MG TABS tablet TAKE ONE TABLET BY MOUTH TWICE DAILY. Patient taking differently: Take 5 mg by mouth 2 (two) times  daily. 06/08/22   O'NealCassie Freer, MD  furosemide (LASIX) 20 MG tablet Take 2 tablets (40 mg total) by mouth daily. 11/27/22   Kathie Dike, MD  HYDROcodone-acetaminophen (NORCO) 5-325 MG tablet Take 1 tablet by mouth every 6 (six) hours as needed for moderate pain. 01/01/23   Sherrye Payor A, PA-C  levothyroxine (SYNTHROID) 175 MCG tablet Take 1 tablet (175 mcg total) by mouth daily. 09/21/22   Laqueta Linden, MD  losartan (COZAAR) 50 MG tablet Take 50 mg by mouth daily.    [provider]  PEG-KCl-NaCl-NaSulf-Na Asc-C (PLENVU) 140 g SOLR Following the doctor's instructions, begin the evening before the procedure at 6pm 10/12/22   Sharyn Creamer, MD  potassium chloride (KLOR-CON) 10 MEQ tablet Take 10 mEq by mouth daily. May take 1 additional tablet ( 10 MeQ) if taking lasix. 03/11/22   [provider]  sertraline (ZOLOFT) 50 MG tablet Take 1 tablet (50 mg total) by mouth daily. 11/28/22   Kathie Dike, MD  spironolactone (ALDACTONE) 25 MG tablet Take 1 tablet (25 mg total) by mouth daily. 12/08/22 12/03/23  O'NealCassie Freer, MD  Vitamin D, Ergocalciferol, (DRISDOL) 1.25 MG (50000 UNIT) CAPS capsule Take 1 capsule (50,000 Units total) by mouth every 7 (seven) days. 09/21/22   Laqueta Linden,  MD      Allergies    Zolpidem tartrate    Review of Systems   Review of Systems  Musculoskeletal:  Positive for joint swelling.       Left wrist pain and swelling    Physical Exam Updated Vital Signs BP (!) 173/103 (BP Location: Left Arm)   Pulse 76   Temp 98.1 F (36.7 C) (Oral)   Resp 18   LMP  (LMP Unknown)   SpO2 99%  Physical Exam Vitals and nursing note reviewed.  Constitutional:      General: She is not in acute distress.    Appearance: She is well-developed.  HENT:     Head: Normocephalic and atraumatic.  Eyes:     Conjunctiva/sclera: Conjunctivae normal.  Cardiovascular:     Rate and Rhythm: Normal rate and regular rhythm.     Heart  sounds: No murmur heard. Pulmonary:     Effort: Pulmonary effort is normal. No respiratory distress.     Breath sounds: Normal breath sounds.  Abdominal:     Palpations: Abdomen is soft.     Tenderness: There is no abdominal tenderness.  Musculoskeletal:        General: No swelling.     Cervical back: Neck supple.     Comments: Left wrist and hand and forearm show mild swelling to the wrist and distal forearm with ecchymosis on the lateral aspect of the distal forearm.  Radial pulses 2+, sensation intact, normal range of motion of fingers.  Tenderness to left wrist diffusely.  Skin:    General: Skin is warm and dry.     Capillary Refill: Capillary refill takes less than 2 seconds.  Neurological:     Mental Status: She is alert.  Psychiatric:        Mood and Affect: Mood normal.     ED Results / Procedures / Treatments   Labs (all labs ordered are listed, but only abnormal results are displayed) Labs Reviewed - No data to display  EKG None  Radiology DG Forearm Left  Result Date: 01/01/2023 CLINICAL DATA:  Fall 6 days ago.  Left forearm injury and pain. EXAM: LEFT FOREARM - 2 VIEW COMPARISON:  None Available. FINDINGS: Mildly displaced fracture of the distal radial metaphysis is seen with mild dorsal angulation. Nondisplaced fracture also seen involving the ulnar styloid process. IMPRESSION: Mildly displaced fracture of distal radial metaphysis, with mild dorsal angulation. Nondisplaced ulnar styloid process fracture. Electronically Signed   By: Marlaine Hind M.D.   On: 01/01/2023 09:22    Procedures .Ortho Injury Treatment  Date/Time: 01/01/2023 10:10 AM  Performed by: Gwenevere Abbot, PA-C Authorized by: Gwenevere Abbot, PA-C   Consent:    Consent obtained:  Verbal   Consent given by:  Patient   Risks discussed:  Restricted joint movementPre-procedure neurovascular assessment: neurovascularly intact Pre-procedure distal perfusion: normal Pre-procedure neurological  function: normal Pre-procedure range of motion: normal  Anesthesia: Local anesthesia used: no  Patient sedated: NoImmobilization: splint Splint type: sugar tong Splint Applied by: ED Nurse Supplies used: cotton padding, elastic bandage and Ortho-Glass Post-procedure neurovascular assessment: post-procedure neurovascularly intact       Medications Ordered in ED Medications  HYDROcodone-acetaminophen (NORCO/VICODIN) 5-325 MG per tablet 1 tablet (1 tablet Oral Given 01/01/23 1005)    ED Course/ Medical Decision Making/ A&P                             Medical  Decision Making This patient presents to the ED for concern of left wrist injury, this involves an extensive number of treatment options, and is a complaint that carries with it a high risk of complications and morbidity.  The differential diagnosis includes fracture, dislocation, sprain, strain, other   Co morbidities that complicate the patient evaluation  Atrial fibrillation taking Eliquis Pretension-blood pressure elevated in the ED but patient has not taken her home medications yet and has no symptoms of this.  She is advised to take her home medications when she gets there and follow-up closely with her primary care doctor for blood pressure recheck  Additional history obtained:  Additional history obtained from EMR External records from outside source obtained and reviewed including outpatient cardiology notes      Imaging Studies ordered:  I ordered imaging studies including x-ray left forearm I independently visualized and interpreted imaging which showed minimally displaced distal radius fracture and nondisplaced ulnar styloid fracture I agree with the radiologist interpretation      Problem List / ED Course / Critical interventions / Medication management  Female with displaced left distal radius fracture with left ulnar styloid fracture nondisplaced, no need for reduction, this happened several days  ago.  The upper extremity is neurovascularly intact.  Patient has seen orthopedics in the past but is not sure who it was and was requesting referral.  Provided.  She is given instructions on splint care and use of the sling, advised on strict return precautions. I ordered medication including Norco for pain Reevaluation of the patient after these medicines showed that the patient improved I have reviewed the patients home medicines and have made adjustments as needed      Amount and/or Complexity of Data Reviewed Radiology: ordered.  Risk Prescription drug management.           Final Clinical Impression(s) / ED Diagnoses Final diagnoses:  Closed fracture of distal end of left radius, unspecified fracture morphology, initial encounter    Rx / DC Orders ED Discharge Orders          Ordered    HYDROcodone-acetaminophen (NORCO) 5-325 MG tablet  Every 6 hours PRN,   Status:  Discontinued        01/01/23 1008    HYDROcodone-acetaminophen (NORCO) 5-325 MG tablet  Every 6 hours PRN        01/01/23 [address] PA-C 01/01/23 1010    [address] PA-C 01/01/23 [place], PA-C 01/01/23 1020    Fredia Sorrow, MD 01/02/23 0725

## 2023-01-01 NOTE — ED Notes (Signed)
Ice pack provided. Pt states she took tylenol this morning with minimal relief.

## 2023-02-01 ENCOUNTER — Inpatient Hospital Stay: Admission: RE | Admit: 2023-02-01 | Payer: Medicare PPO | Source: Ambulatory Visit

## 2023-02-09 ENCOUNTER — Encounter (HOSPITAL_COMMUNITY): Payer: Self-pay

## 2023-02-09 ENCOUNTER — Encounter (HOSPITAL_COMMUNITY): Payer: Medicare PPO | Admitting: Psychiatry

## 2023-02-09 NOTE — Progress Notes (Signed)
This encounter was created in error - please disregard.

## 2023-02-09 NOTE — Progress Notes (Deleted)
Psychiatric Initial Adult Assessment   Patient Identification: [name] MRN:  [id number] Date of Evaluation:  02/09/2023 Referral Source: Hospitalist Chief Complaint:  No chief complaint on file.  Visit Diagnosis: No diagnosis found.   Assessment:  [name] is a 72 y.o. female with a history of *** who presents in person to [facility] for initial evaluation on 02/09/2023.    Patient reports ***  A number of assessments were performed during the evaluation today including  PHQ-9 which they scored a *** on, GAD-7 which they scored a *** on, and [place] suicide severity screening which showed ***.  Based on these assessments patient would benefit from medication adjustment to better target their symptoms.  Plan: - Cymbalta 60 mg BID - Zoloft 50 mg QD - Amitriptyline 25 mg QHS - Valium 10 mg QHS prescribed by oncology - CMP, CBC, Vit B12, Folate, Vit B1, TSH, PTH, iron panel reviewed - Crisis resources reviewed - Follow up in  History of Present Illness:  Patients presents following referral after her hospital admission in December of 2023.  Patient had been admitted medically on 11-25-2022 for acute respiratory failure.  Psychiatry was consulted for medication recommendations for depression and suggestions for patient's reported alcohol use disorder.  Patient had been on a regimen of amitriptyline, diazepam, and duloxetine as an outpatient and she was started on Zoloft which was titrated to 50 mg while hospitalized.  Following her hospitalization patient transferred to an inpatient rehab.     Associated Signs/Symptoms: Depression Symptoms:  {DEPRESSION SYMPTOMS:20000} (Hypo) Manic Symptoms:  {BHH MANIC SYMPTOMS:22872} Anxiety Symptoms:  {BHH ANXIETY SYMPTOMS:22873} Psychotic Symptoms:  {BHH PSYCHOTIC SYMPTOMS:22874} PTSD Symptoms: {BHH PTSD SYMPTOMS:22875}  Past Psychiatric History: Denies prior psychiatric hospitalizations,  outpatient providers, or past suicide attempts. Patient was admitted to an alcohol rehab following her hospital admission in December of 2023.    Has taken Cymbalta, Zoloft, Wellbutrin, BuSpar, amitriptyline, Valium, Xanax, Ativan, and gabapentin in the past.  Currently on Cymbalta, Zoloft, amitriptyline, and Valium.   Reports a hx of alcohol use 5 drinks a week.Denies any other substance use.  Previous Psychotropic Medications: Yes   Substance Abuse History in the last 12 months:  Yes.    Consequences of Substance Abuse: {BHH CONSEQUENCES OF SUBSTANCE ABUSE:22880}  Past Medical History:  Past Medical History:  Diagnosis Date   Anxiety    Atrial fibrillation [facility])    sees Dr. Audie Box   Bilateral swelling of feet    Cancer [facility])    breast   Chronic diarrhea    Chronic pain    Concussion 02/2007   ICU x 3 days   Depression    Hypertension    Hypothyroidism    Personal history of chemotherapy    Personal history of radiation therapy    Spinal stenosis    Thyroid disease ?1994   Vitamin D deficiency     Past Surgical History:  Procedure Laterality Date   BREAST BIOPSY Right 12/20/2019   x2   BREAST LUMPECTOMY Right 01/22/2020   BREAST LUMPECTOMY WITH RADIOACTIVE SEED AND SENTINEL LYMPH NODE BIOPSY Right 01/22/2020   Procedure: RIGHT BREAST LUMPECTOMY WITH RADIOACTIVE SEED X2 AND RIGHT SENTINEL LYMPH NODE MAPPING;  Surgeon: Erroll Luna, MD;  Location: [place];  Service: General;  Laterality: Right;   BREAST SURGERY Right 03/1998   breast biopsy, benign   EYE SURGERY Right    PORT-A-CATH REMOVAL Right 04/30/2021   Procedure: REMOVAL PORT-A-CATH;  Surgeon:  Erroll Luna, MD;  Location: [place];  Service: General;  Laterality: Right;   PORTACATH PLACEMENT Right 01/22/2020   Procedure: INSERTION PORT-A-CATH WITH ULTRASOUND;  Surgeon: Erroll Luna, MD;  Location: [place];  Service: General;  Laterality: Right;    PORTACATH PLACEMENT Right 02/28/2020   Procedure: PORT A CATH REVISION;  Surgeon: Erroll Luna, MD;  Location: [place];  Service: General;  Laterality: Right;   REVERSE SHOULDER ARTHROPLASTY Left 02/02/2021   Procedure: LEFT REVERSE SHOULDER ARTHROPLASTY;  Surgeon: Meredith Pel, MD;  Location: [place];  Service: Orthopedics;  Laterality: Left;   SHOULDER SURGERY Right    SPINAL FUSION  03/04/2011   with ORIF    Family Psychiatric History: Denies  Family History:  Family History  Problem Relation Age of Onset   Thyroid disease Mother    Dementia Mother    Depression Mother    Anxiety disorder Mother    Diabetes Father    Stroke Father    Alcoholism Father    Hypertension Sister    Diabetes Sister    Colon cancer Neg Hx    Colon polyps Neg Hx    Stomach cancer Neg Hx    Esophageal cancer Neg Hx     Social History:   Social History   Socioeconomic History   Marital status: Married    Spouse name: Not on file   Number of children: 1   Years of education: Not on file   Highest education level: Not on file  Occupational History   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  Tobacco Use   Smoking status: Former    Years: 20.00    Types: Cigarettes    Quit date: 1980    Years since quitting: 44.1   Smokeless tobacco: Never  Vaping Use   Vaping Use: Never used  Substance and Sexual Activity   Alcohol use: Yes    Alcohol/week: 5.0 standard drinks of alcohol    Types: 5 Standard drinks or equivalent per week    Comment: wine   Drug use: No   Sexual activity: Not Currently    Partners: Male    Birth control/protection: Post-menopausal  Other Topics Concern   Not on file  Social History Narrative   Not on file   Social Determinants of Health   Financial Resource Strain: Not on file  Food Insecurity: No Food Insecurity (11/19/2022)   Hunger Vital Sign    Worried About Running Out of Food in the Last Year: Never true    Ran Out of Food in the Last Year: Never true   Transportation Needs: No Transportation Needs (11/19/2022)   PRAPARE - Hydrologist (Medical): No    Lack of Transportation (Non-Medical): No  Physical Activity: Not on file  Stress: Not on file  Social Connections: Not on file    Additional Social History: ***  Allergies:   Allergies  Allergen Reactions   Zolpidem Tartrate Other (See Comments)    Hallucinations and Confusion Other reaction(s): Hallucinations confusion     Metabolic Disorder Labs: Lab Results  Component Value Date   HGBA1C 5.8 (H) 07/21/2022   No results found for: "PROLACTIN" Lab Results  Component Value Date   CHOL 271 (H) 07/21/2022   TRIG 165 (H) 07/21/2022   HDL 103 07/21/2022   LDLCALC 140 (H) 07/21/2022   Lab Results  Component Value Date   TSH 16.500 (H) 07/21/2022    Therapeutic Level Labs: No results  found for: "LITHIUM" No results found for: "CBMZ" No results found for: "VALPROATE"  Current Medications: Current Outpatient Medications  Medication Sig Dispense Refill   amitriptyline (ELAVIL) 25 MG tablet Take 25 mg by mouth at bedtime.     diazepam (VALIUM) 10 MG tablet Take 10 mg by mouth at bedtime as needed.     acetaminophen (TYLENOL) 500 MG tablet Take 1,000 mg by mouth every 6 (six) hours as needed for mild pain.     anastrozole (ARIMIDEX) 1 MG tablet TAKE ONE TABLET BY MOUTH DAILY. (Patient taking differently: Take 1 mg by mouth daily.) 90 tablet 3   carvedilol (COREG) 12.5 MG tablet Take 1 tablet (12.5 mg total) by mouth 2 (two) times daily. 30 tablet 6   chlordiazePOXIDE (LIBRIUM) 25 MG capsule Take 1 tab po tid for 2 days then 1 tab po bid for 2 days then 1 tab po daily for 2 days 12 capsule 0   dicyclomine (BENTYL) 10 MG capsule Take 10 mg by mouth 4 (four) times daily -  before meals and at bedtime.     DULoxetine (CYMBALTA) 60 MG capsule Take 60 mg by mouth 2 (two) times daily.     ELIQUIS 5 MG TABS tablet TAKE ONE TABLET BY MOUTH TWICE DAILY.  (Patient taking differently: Take 5 mg by mouth 2 (two) times daily.) 180 tablet 1   furosemide (LASIX) 20 MG tablet Take 2 tablets (40 mg total) by mouth daily. 30 tablet 0   HYDROcodone-acetaminophen (NORCO) 5-325 MG tablet Take 1 tablet by mouth every 6 (six) hours as needed for moderate pain. 12 tablet 0   levothyroxine (SYNTHROID) 175 MCG tablet Take 1 tablet (175 mcg total) by mouth daily. 45 tablet 0   losartan (COZAAR) 50 MG tablet Take 50 mg by mouth daily.     PEG-KCl-NaCl-NaSulf-Na Asc-C (PLENVU) 140 g SOLR Following the doctor's instructions, begin the evening before the procedure at 6pm 1 each 0   potassium chloride (KLOR-CON) 10 MEQ tablet Take 10 mEq by mouth daily. May take 1 additional tablet ( 10 MeQ) if taking lasix.     sertraline (ZOLOFT) 50 MG tablet Take 1 tablet (50 mg total) by mouth daily. 30 tablet 1   spironolactone (ALDACTONE) 25 MG tablet Take 1 tablet (25 mg total) by mouth daily. 90 tablet 3   Vitamin D, Ergocalciferol, (DRISDOL) 1.25 MG (50000 UNIT) CAPS capsule Take 1 capsule (50,000 Units total) by mouth every 7 (seven) days. 4 capsule 0   No current facility-administered medications for this visit.    Musculoskeletal: Strength & Muscle Tone: {desc; muscle tone:32375} Gait & Station: {PE GAIT ED QX:8161427 Patient leans: {Patient Leans:21022755}  Psychiatric Specialty Exam: Review of Systems  There were no vitals taken for this visit.There is no height or weight on file to calculate BMI.  General Appearance: {Appearance:22683}  Eye Contact:  {BHH EYE CONTACT:22684}  Speech:  {Speech:22685}  Volume:  {Volume (PAA):22686}  Mood:  {BHH MOOD:22306}  Affect:  {Affect (PAA):22687}  Thought Process:  {Thought Process (PAA):22688}  Orientation:  {BHH ORIENTATION (PAA):22689}  Thought Content:  {Thought Content:22690}  Suicidal Thoughts:  {ST/HT (PAA):22692}  Homicidal Thoughts:  {ST/HT (PAA):22692}  Memory:  {BHH MEMORY:22881}  Judgement:  {Judgement  (PAA):22694}  Insight:  {Insight (PAA):22695}  Psychomotor Activity:  {Psychomotor (PAA):22696}  Concentration:  {Concentration:21399}  Recall:  {BHH GOOD/FAIR/POOR:22877}  Fund of Knowledge:{BHH GOOD/FAIR/POOR:22877}  Language: {BHH GOOD/FAIR/POOR:22877}  Akathisia:  {BHH YES OR NO:22294}    AIMS (if indicated):  {Desc;  done/not:10129}  Assets:  {Assets (PAA):22698}  ADL's:  {BHH XO:4411959  Cognition: {chl bhh cognition:304700322}  Sleep:  {BHH GOOD/FAIR/POOR:22877}   Screenings: PHQ2-9    Flowsheet Row Patient Outreach Telephone from 07/29/2022 in [place] [facility] Visit from 07/21/2022 in [facility] [place] from 07/02/2022 in [place]  PHQ-2 Total Score 3 6 6  $ PHQ-9 Total Score 10 18 16      $ Flowsheet Row ED from 01/01/2023 in [facility] Emergency Department at [facility] ED to Hosp-Admission (Discharged) from 11/17/2022 in [place] ED from 04/08/2022 in [facility] Emergency Department at [place] No Risk No Risk No Risk        Collaboration of Care: {BH OP Collaboration of Care:21014065}  Patient/Guardian was advised Release of Information must be obtained prior to any record release in order to collaborate their care with an outside provider. Patient/Guardian was advised if they have not already done so to contact the registration department to sign all necessary forms in order for [place] to release information regarding their care.   Consent: Patient/Guardian gives verbal consent for treatment and assignment of benefits for services provided during this visit. Patient/Guardian expressed understanding and agreed to proceed.   Vista Mink, MD 2/21/20241:56 PM

## 2023-03-15 ENCOUNTER — Telehealth: Payer: Self-pay | Admitting: Cardiovascular Disease

## 2023-03-15 ENCOUNTER — Other Ambulatory Visit: Payer: Self-pay | Admitting: Cardiovascular Disease

## 2023-03-15 DIAGNOSIS — I4819 Other persistent atrial fibrillation: Secondary | ICD-10-CM

## 2023-03-15 MED ORDER — APIXABAN 5 MG PO TABS: 5.0000 mg | ORAL_TABLET | Freq: Two times a day (BID) | ORAL | 1 refills | Status: DC

## 2023-03-15 NOTE — Telephone Encounter (Signed)
 *  STAT* If patient is at the pharmacy, call can be transferred to refill team.   1. Which medications need to be refilled? (please list name of each medication and dose if known)   ELIQUIS 5 MG TABS tablet    2. Which pharmacy/location (including street and city if local pharmacy) is medication to be sent to?  [place], [place] [address]    3. Do they need a 30 day or 90 day supply? 90 days   Pt is out of meds need refill today

## 2023-03-15 NOTE — Telephone Encounter (Signed)
Dosing correct, refill sent in.

## 2023-03-15 NOTE — Telephone Encounter (Signed)
Pt last saw Dr Audie Box 12/08/22, last labs 11/25/22 Creat 0.75, age 72, weight 113.9kg, based on specified criteria pt is on appropriate dosage of Eliquis 5mg  BID for afib.  Will refill rx.

## 2023-06-08 ENCOUNTER — Other Ambulatory Visit: Payer: Self-pay

## 2023-06-08 ENCOUNTER — Ambulatory Visit: Payer: Medicare PPO | Admitting: Physical Therapy

## 2023-06-08 ENCOUNTER — Encounter: Payer: Self-pay | Admitting: Physical Therapy

## 2023-06-08 DIAGNOSIS — R262 Difficulty in walking, not elsewhere classified: Secondary | ICD-10-CM | POA: Diagnosis not present

## 2023-06-08 DIAGNOSIS — M5459 Other low back pain: Secondary | ICD-10-CM

## 2023-06-08 DIAGNOSIS — M6281 Muscle weakness (generalized): Secondary | ICD-10-CM

## 2023-06-08 DIAGNOSIS — Z9181 History of falling: Secondary | ICD-10-CM | POA: Diagnosis not present

## 2023-06-08 NOTE — Therapy (Signed)
OUTPATIENT PHYSICAL THERAPY THORACOLUMBAR EVALUATION  Referring diagnosis? M48.00 (ICD-10-CM) - Spinal stenosis, site unspecified Treatment diagnosis? (if different than referring diagnosis) M54.59 What was this (referring dx) caused by? []  Surgery [x]  Fall [x]  Ongoing issue [x]  Arthritis []  Other: ____________  Laterality: []  Rt []  Lt [x]  Both  Check all possible CPT codes:  *CHOOSE 10 OR LESS*    [x]  97110 (Therapeutic Exercise)  []  92507 (SLP Treatment)  [x]  97112 (Neuro Re-ed)   []  92526 (Swallowing Treatment)   [x]  97116 (Gait Training)   []  [id number] (Cognitive Training, 1st 15 minutes) [x]  97140 (Manual Therapy)   []  97130 (Cognitive Training, each add'l 15 minutes)  []  97164 (Re-evaluation)                              []  Other, List CPT Code ____________  [x]  97530 (Therapeutic Activities)     []  97535 (Self Care)   []  All codes above (97110 - 97535)  [x]  97012 (Mechanical Traction)  [x]  97014 (E-stim Unattended)  []  97032 (E-stim manual)  []  97033 (Ionto)  []  97035 (Ultrasound) []  97750 (Physical Performance Training) []  [id number] (Aquatic Therapy) []  97016 (Vasopneumatic Device) []  [id number] (Paraffin) []  97034 (Contrast Bath) []  16109 (Wound Care 1st 20 sq cm) []  97598 (Wound Care each add'l 20 sq cm) []  97760 (Orthotic Fabrication, Fitting, Training Initial) []  [id number] (Prosthetic Management and Training Initial) []  [id number] (Orthotic or Prosthetic Training/ Modification Subsequent)    Patient Name: [name] MRN: [id number] DOB:[birth year], 72 y.o., female Today's Date: 06/08/2023  END OF SESSION:  PT End of Session - 06/08/23 1521     Visit Number 1    Number of Visits 12    Date for PT Re-Evaluation 08/03/23    Authorization Type [employer]    PT Start Time 1430    PT Stop Time 1515    PT Time Calculation (min) 45 min    Activity Tolerance Patient tolerated treatment well    Behavior During Therapy WFL for tasks assessed/performed             Past  Medical History:  Diagnosis Date   Anxiety    Atrial fibrillation (HCC)    sees Dr. Flora Lipps   Bilateral swelling of feet    Cancer (HCC)    breast   Chronic diarrhea    Chronic pain    Concussion 02/2007   ICU x 3 days   Depression    Hypertension    Hypothyroidism    Personal history of chemotherapy    Personal history of radiation therapy    Spinal stenosis    Thyroid disease ?1994   Vitamin D deficiency    Past Surgical History:  Procedure Laterality Date   BREAST BIOPSY Right 12/20/2019   x2   BREAST LUMPECTOMY Right 01/22/2020   BREAST LUMPECTOMY WITH RADIOACTIVE SEED AND SENTINEL LYMPH NODE BIOPSY Right 01/22/2020   Procedure: RIGHT BREAST LUMPECTOMY WITH RADIOACTIVE SEED X2 AND RIGHT SENTINEL LYMPH NODE MAPPING;  Surgeon: Harriette Bouillon, MD;  Location: [facility];  Service: General;  Laterality: Right;   BREAST SURGERY Right 03/1998   breast biopsy, benign   EYE SURGERY Right    PORT-A-CATH REMOVAL Right 04/30/2021   Procedure: REMOVAL PORT-A-CATH;  Surgeon: Harriette Bouillon, MD;  Location: [facility];  Service: General;  Laterality: Right;   PORTACATH PLACEMENT Right 01/22/2020   Procedure: INSERTION PORT-A-CATH WITH ULTRASOUND;  Surgeon: Harriette Bouillon,  MD;  Location: [facility];  Service: General;  Laterality: Right;   PORTACATH PLACEMENT Right 02/28/2020   Procedure: PORT A CATH REVISION;  Surgeon: Harriette Bouillon, MD;  Location: [facility];  Service: General;  Laterality: Right;   REVERSE SHOULDER ARTHROPLASTY Left 02/02/2021   Procedure: LEFT REVERSE SHOULDER ARTHROPLASTY;  Surgeon: Cammy Copa, MD;  Location: [facility];  Service: Orthopedics;  Laterality: Left;   SHOULDER SURGERY Right    SPINAL FUSION  03/04/2011   with ORIF   Patient Active Problem List   Diagnosis Date Noted   Generalized anxiety disorder 11/21/2022   Alcohol abuse with alcohol-induced mood disorder (HCC) 11/21/2022   Alcohol abuse with  alcohol-induced anxiety disorder (HCC) 11/21/2022   Pulmonary edema 11/18/2022   Acute respiratory failure with hypoxia and hypercapnia (HCC) 11/17/2022   Acute on chronic diastolic (congestive) heart failure (HCC) 11/17/2022   Arthritis of left shoulder region    S/P reverse total shoulder arthroplasty, 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   Vitamin D deficiency 01/15/2021   Vitamin B12 deficiency 01/15/2021   Primary insomnia 01/15/2021   Morbid obesity (HCC) 01/15/2021   Moderate recurrent major depression (HCC) 01/15/2021   Benzodiazepine dependence (HCC) 01/15/2021   Port-A-Cath in place 07/22/2020   Left leg cellulitis 04/17/2020   Cellulitis of left leg 04/17/2020   Hypokalemia 04/17/2020   Macrocytic anemia 04/17/2020   Anxiety 04/17/2020   Depression 04/17/2020   Chronic diarrhea 04/17/2020   Chemotherapy induced diarrhea 04/17/2020   Malignant neoplasm of upper-inner quadrant of right breast in female, estrogen receptor positive (HCC) 12/26/2019   Encephalopathy 02/02/2018   Hypothyroidism 02/02/2018   AKI (acute kidney injury) (HCC) 02/02/2018   Chronic back pain 02/02/2018   Alcohol abuse 02/02/2018   Weight gain 02/02/2018   Benign essential HTN 02/02/2018    PCP: Rebecka Apley, NP   REFERRING PROVIDER: Merryl Hacker, NP   REFERRING DIAG: M48.00 (ICD-10-CM) - Spinal stenosis, site unspecified  Rationale for Evaluation and Treatment: Rehabilitation  THERAPY DIAG:  Other low back pain  Muscle weakness (generalized)  Difficulty in walking, not elsewhere classified  History of falling  ONSET DATE: Chronic back pain for many years but worse over last 6 months.   SUBJECTIVE:                                                                                                                                                                                           SUBJECTIVE STATEMENT: She has had chronic back pain for many years. She has had back surgery in the past and  has had to undergo radiation treatments for breast Ca. She has  had some relief from DN with PT in the past. She feels her balance is terrible  PERTINENT HISTORY:  Lt rTSA 2022, anxiety, breast cancer with lumpectomy and radioactive seed placement 2021 and recent port removal, chronic pain, concussion 2008, HTN, depression, spinal fusion 2012, alcohol abuse  PAIN:  Are you having pain? Yes: NPRS scale: 8/10 Pain location: left side low back Pain description: sharp, ache Aggravating factors: has not been able to pinpoint this, sometimes she just wakes up with pain Relieving factors: sitting  PRECAUTIONS: Fall  WEIGHT BEARING RESTRICTIONS: No  FALLS:  Has patient fallen in last 6 months? Yes. Number of falls 1, fell in hall and broke her wrist. Relays several near misses.    PLOF: Independent with basic ADLs  PATIENT GOALS: reduce pain and improve balance.   NEXT MD VISIT: nothing scheduled  OBJECTIVE:   DIAGNOSTIC FINDINGS:  Lumbar CT 04/08/22  IMPRESSION: 1. No acute osseous abnormality. 2. Adjacent segment disease at L3-4 with mild spinal stenosis and moderate left neural foraminal stenosis, progressed from 2019. 3. Solid L4-5 fusion. 4. 2 cm left adrenal mass with indeterminate noncontrast CT characteristics but likely benign given 2 year size stability.  PATIENT SURVEYS:  Eval: FOTO 38% functional intake, goal is 45%  SCREENING FOR RED FLAGS: Bowel or bladder incontinence: No  COGNITION: Overall cognitive status: Within functional limits for tasks assessed      POSTURE: slumped posture   LUMBAR ROM:   AROM eval  Flexion 50%  Extension 25%*  Right lateral flexion 50%*  Left lateral flexion 50%*  Right rotation 50%*  Left rotation 50%   (Blank rows = not tested) *denotes pain   LOWER EXTREMITY MMT:    MMT tested in sitting Right eval Left eval  Hip flexion 4 4  Hip extension    Hip abduction 4 4  Hip adduction    Hip internal rotation    Hip  external rotation    Knee flexion 4+ 4+  Knee extension 4+ 4+  Ankle dorsiflexion 5 5  Ankle plantarflexion 5 5  Ankle inversion    Ankle eversion     (Blank rows = not tested)  LUMBAR SPECIAL TESTS:  Direction preference for lumbar flexion vs extensoin  FUNCTIONAL TESTS:   [facility] PT Assessment - 06/08/23 0001       Standardized Balance Assessment   Standardized Balance Assessment Berg Balance Test      Berg Balance Test   Sit to Stand Able to stand  independently using hands    Standing Unsupported Able to stand 2 minutes with supervision    Sitting with Back Unsupported but Feet Supported on Floor or Stool Able to sit safely and securely 2 minutes    Stand to Sit Controls descent by using hands    Transfers Able to transfer safely, definite need of hands    Standing Unsupported with Eyes Closed Able to stand 3 seconds    Standing Unsupported with Feet Together Able to place feet together independently and stand for 1 minute with supervision    From Standing, Reach Forward with Outstretched Arm Can reach forward >12 cm safely (5")    From Standing Position, Pick up Object from Floor Able to pick up shoe, needs supervision    From Standing Position, Turn to Look Behind Over each Shoulder Looks behind one side only/other side shows less weight shift    Turn 360 Degrees Able to turn 360 degrees safely but slowly    Standing Unsupported,  Alternately Place Feet on Step/Stool Needs assistance to keep from falling or unable to try    Standing Unsupported, One Foot in [employer] balance while stepping or standing    Standing on One Leg Unable to try or needs assist to prevent fall    Total Score 32              GAIT: eval Comments: unsteady gait, uses quad cane for assistance  TODAY'S TREATMENT:                                                                                                                              DATE:06/08/23 Therex HEP creation and review with  demonstration and education  Nu step L5 X 8 min UE/LE  Neuromuscular Re-ed See above BERG balance test   PATIENT EDUCATION:  Education details: HEP, PT plan of care Person educated: Patient Education method: Explanation, Demonstration, Verbal cues, and Handouts Education comprehension: verbalized understanding and needs further education  HOME EXERCISE PROGRAM: Access Code: [email or web address] URL: [email or web address] Date: 06/08/2023 Prepared by: Ivery Quale  Exercises - Supine Lower Trunk Rotation  - 2 x daily - 6 x weekly - 1 sets - 10 reps - 5 sec hold - Supine Single Knee to Chest Stretch  - 2 x daily - 6 x weekly - 1 sets - 2 reps - 30 sec hold - Supine Double Knee to Chest  - 2 x daily - 6 x weekly - 1 sets - 3 reps - 30 sec hold - Standing 'L' Stretch at Counter  - 2 x daily - 6 x weekly - 1 sets - 10 reps - 10 sec hold - Standing Tandem Balance with Counter Support  - 2 x daily - 6 x weekly - 1 sets - 3 reps - 30 sec hold - Walking with Head Rotation  - 2 x daily - 6 x weekly - 3 reps - Walking with Head Nod  - 2 x daily - 6 x weekly - 3 reps - Feet Together Balance at [employer] Eyes Closed  - 2 x daily - 6 x weekly - 1 sets - 3 reps - 3- sec hold  ASSESSMENT:  CLINICAL IMPRESSION: Patient referred to physical therapy evaluation and treatment for Chronic Low back pain and spinal stenosis L3-4. She has history of spinal fusion at L4-5 and also has unsteady gait. Her balance testing today places her at a high risk of falling and she does have history of falls. She will benefit from skilled PT address her functional deficits.   OBJECTIVE IMPAIRMENTS: decreased activity tolerance, decreased balance, decreased coordination, decreased endurance, difficulty walking, decreased ROM, decreased strength, improper body mechanics, postural dysfunction, obesity, and pain.   ACTIVITY LIMITATIONS: carrying, lifting, bending, sitting, standing, squatting, sleeping, stairs, and  transfers  PARTICIPATION LIMITATIONS: meal prep, cleaning, laundry, and community activity  PERSONAL FACTORS: Past/current experiences, Time since onset of injury/illness/exacerbation, and 3+ comorbidities: Lt rTSA 2022, anxiety, breast  cancer with lumpectomy and radioactive seed placement 2021 and recent port removal, chronic pain, concussion 2008, HTN, depression, spinal fusion 2012, alcohol abuse  are also affecting patient's functional outcome.   REHAB POTENTIAL: Good  CLINICAL DECISION MAKING: Evolving/moderate complexity  EVALUATION COMPLEXITY: Moderate    GOALS: Short term PT Goals Target date: 07/06/2023   Pt will be I and compliant with HEP. Baseline:  Goal status: New Pt will decrease pain by 25% overall Baseline: Goal status: New  Long term PT goals Target date:08/31/2023   Pt will improve lumbar ROM to [facility] to improve functional mobility Baseline: Goal status: New Pt will improve  hip/knee strength to at least 4+/5 MMT to improve functional strength Baseline: Goal status: New Pt will improve FOTO to at least 45% functional to show improved function Baseline: Goal status: New Pt will reduce back pain by overall 50% overall with usual activity Baseline: Goal status: New Pt will improve BERG balance test to 45 points to show improved balance. Baseline: Goal status: New  PLAN: PT FREQUENCY: 1-2 times per week   PT DURATION: 6-12 weeks  PLANNED INTERVENTIONS (unless contraindicated): aquatic PT, Canalith repositioning, cryotherapy, Electrical stimulation, Iontophoresis with 4 mg/ml dexamethasome, Moist heat, traction, Ultrasound, gait training, Therapeutic exercise, balance training, neuromuscular re-education, patient/family education, prosthetic training, manual techniques, passive ROM, dry needling, taping, vasopnuematic device, vestibular, spinal manipulations, joint manipulations  PLAN FOR NEXT SESSION: review HEP, flexion based stretching program, general  strength and endurance, balance.    April Manson, PT,DPT 06/08/2023, 3:23 PM

## 2023-06-14 ENCOUNTER — Ambulatory Visit: Payer: Medicare PPO | Admitting: Physical Therapy

## 2023-06-14 ENCOUNTER — Encounter: Payer: Self-pay | Admitting: Physical Therapy

## 2023-06-14 DIAGNOSIS — M25561 Pain in right knee: Secondary | ICD-10-CM | POA: Diagnosis not present

## 2023-06-14 DIAGNOSIS — M6281 Muscle weakness (generalized): Secondary | ICD-10-CM

## 2023-06-14 DIAGNOSIS — M5459 Other low back pain: Secondary | ICD-10-CM

## 2023-06-14 DIAGNOSIS — R262 Difficulty in walking, not elsewhere classified: Secondary | ICD-10-CM

## 2023-06-14 NOTE — Therapy (Signed)
OUTPATIENT PHYSICAL THERAPY TREATMENT    Patient Name: [name] MRN: [id number] DOB:[birth year], 72 y.o., female Today's Date: 06/14/2023  END OF SESSION:  PT End of Session - 06/14/23 1613     Visit Number 2    Number of Visits 12    Date for PT Re-Evaluation 08/03/23    Authorization Type [employer]    PT Start Time 1600    PT Stop Time 1640    PT Time Calculation (min) 40 min    Activity Tolerance Patient tolerated treatment well    Behavior During Therapy WFL for tasks assessed/performed             Past Medical History:  Diagnosis Date   Anxiety    Atrial fibrillation [facility])    sees Dr. Flora Lipps   Bilateral swelling of feet    Cancer (HCC)    breast   Chronic diarrhea    Chronic pain    Concussion 02/2007   ICU x 3 days   Depression    Hypertension    Hypothyroidism    Personal history of chemotherapy    Personal history of radiation therapy    Spinal stenosis    Thyroid disease ?1994   Vitamin D deficiency    Past Surgical History:  Procedure Laterality Date   BREAST BIOPSY Right 12/20/2019   x2   BREAST LUMPECTOMY Right 01/22/2020   BREAST LUMPECTOMY WITH RADIOACTIVE SEED AND SENTINEL LYMPH NODE BIOPSY Right 01/22/2020   Procedure: RIGHT BREAST LUMPECTOMY WITH RADIOACTIVE SEED X2 AND RIGHT SENTINEL LYMPH NODE MAPPING;  Surgeon: Harriette Bouillon, MD;  Location: [facility];  Service: General;  Laterality: Right;   BREAST SURGERY Right 03/1998   breast biopsy, benign   EYE SURGERY Right    PORT-A-CATH REMOVAL Right 04/30/2021   Procedure: REMOVAL PORT-A-CATH;  Surgeon: Harriette Bouillon, MD;  Location: [facility];  Service: General;  Laterality: Right;   PORTACATH PLACEMENT Right 01/22/2020   Procedure: INSERTION PORT-A-CATH WITH ULTRASOUND;  Surgeon: Harriette Bouillon, MD;  Location:  SURGERY CENTER;  Service: General;  Laterality: Right;   PORTACATH PLACEMENT Right 02/28/2020   Procedure: PORT A CATH REVISION;   Surgeon: Harriette Bouillon, MD;  Location: [facility] OR;  Service: General;  Laterality: Right;   REVERSE SHOULDER ARTHROPLASTY Left 02/02/2021   Procedure: LEFT REVERSE SHOULDER ARTHROPLASTY;  Surgeon: Cammy Copa, MD;  Location: [facility] OR;  Service: Orthopedics;  Laterality: Left;   SHOULDER SURGERY Right    SPINAL FUSION  03/04/2011   with ORIF   Patient Active Problem List   Diagnosis Date Noted   Generalized anxiety disorder 11/21/2022   Alcohol abuse with alcohol-induced mood disorder (HCC) 11/21/2022   Alcohol abuse with alcohol-induced anxiety disorder (HCC) 11/21/2022   Pulmonary edema 11/18/2022   Acute respiratory failure with hypoxia and hypercapnia (HCC) 11/17/2022   Acute on chronic diastolic (congestive) heart failure (HCC) 11/17/2022   Arthritis of left shoulder region    S/P reverse total shoulder arthroplasty, 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   Vitamin D deficiency 01/15/2021   Vitamin B12 deficiency 01/15/2021   Primary insomnia 01/15/2021   Morbid obesity (HCC) 01/15/2021   Moderate recurrent major depression (HCC) 01/15/2021   Benzodiazepine dependence (HCC) 01/15/2021   Port-A-Cath in place 07/22/2020   Left leg cellulitis 04/17/2020   Cellulitis of left leg 04/17/2020   Hypokalemia 04/17/2020   Macrocytic anemia 04/17/2020   Anxiety 04/17/2020   Depression 04/17/2020   Chronic diarrhea 04/17/2020   Chemotherapy induced diarrhea 04/17/2020  Malignant neoplasm of upper-inner quadrant of right breast in female, estrogen receptor positive (HCC) 12/26/2019   Encephalopathy 02/02/2018   Hypothyroidism 02/02/2018   AKI (acute kidney injury) (HCC) 02/02/2018   Chronic back pain 02/02/2018   Alcohol abuse 02/02/2018   Weight gain 02/02/2018   Benign essential HTN 02/02/2018    PCP: Rebecka Apley, NP   REFERRING PROVIDER: Merryl Hacker, NP   REFERRING DIAG: M48.00 (ICD-10-CM) - Spinal stenosis, site unspecified  Rationale for Evaluation and Treatment:  Rehabilitation  THERAPY DIAG:  Other low back pain  Muscle weakness (generalized)  Difficulty in walking, not elsewhere classified  Acute pain of right knee  ONSET DATE: Chronic back pain for many years but worse over last 6 months.   SUBJECTIVE:                                                                                                                                                                                           SUBJECTIVE STATEMENT: She relays her back pain is not as bad as last time.   PERTINENT HISTORY:  Lt rTSA 2022, anxiety, breast cancer with lumpectomy and radioactive seed placement 2021 and recent port removal, chronic pain, concussion 2008, HTN, depression, spinal fusion 2012, alcohol abuse  PAIN:  Are you having pain? Yes: NPRS scale: 4/10 Pain location: left side low back Pain description: sharp, ache Aggravating factors: has not been able to pinpoint this, sometimes she just wakes up with pain Relieving factors: sitting  PRECAUTIONS: Fall  WEIGHT BEARING RESTRICTIONS: No  FALLS:  Has patient fallen in last 6 months? Yes. Number of falls 1, fell in hall and broke her wrist. Relays several near misses.    PLOF: Independent with basic ADLs  PATIENT GOALS: reduce pain and improve balance.   NEXT MD VISIT: nothing scheduled  OBJECTIVE:   DIAGNOSTIC FINDINGS:  Lumbar CT 04/08/22  IMPRESSION: 1. No acute osseous abnormality. 2. Adjacent segment disease at L3-4 with mild spinal stenosis and moderate left neural foraminal stenosis, progressed from 2019. 3. Solid L4-5 fusion. 4. 2 cm left adrenal mass with indeterminate noncontrast CT characteristics but likely benign given 2 year size stability.  PATIENT SURVEYS:  Eval: FOTO 38% functional intake, goal is 45%  SCREENING FOR RED FLAGS: Bowel or bladder incontinence: No  COGNITION: Overall cognitive status: Within functional limits for tasks assessed      POSTURE: slumped  posture   LUMBAR ROM:   AROM eval  Flexion 50%  Extension 25%*  Right lateral flexion 50%*  Left lateral flexion 50%*  Right rotation 50%*  Left rotation 50%   (Blank rows = not tested) *  denotes pain   LOWER EXTREMITY MMT:    MMT tested in sitting Right eval Left eval  Hip flexion 4 4  Hip extension    Hip abduction 4 4  Hip adduction    Hip internal rotation    Hip external rotation    Knee flexion 4+ 4+  Knee extension 4+ 4+  Ankle dorsiflexion 5 5  Ankle plantarflexion 5 5  Ankle inversion    Ankle eversion     (Blank rows = not tested)  LUMBAR SPECIAL TESTS:  Direction preference for lumbar flexion vs extensoin  FUNCTIONAL TESTS:      GAIT: eval Comments: unsteady gait, uses quad cane for assistance  TODAY'S TREATMENT:                                                                                                                              DATE:06/14/23 Nu step L6 X 10 min UE/LE Seated lumbar flexion stretch p ball roll outs 10 sec X 10 Seated pball core isometric shoulder extension 5 sec X 10 Seated pball core isometric hip flexion 5 sec X 10 Leg press machine DL 74# [id number] Balance:  Feet together on airex pad 30 sec X 3  Feet apart on flat level ground eyes closed 30 sec X3  Tandem walk modified, in bars 2 round trips   PATIENT EDUCATION:  Education details: HEP, PT plan of care Person educated: Patient Education method: Explanation, Demonstration, Verbal cues, and Handouts Education comprehension: verbalized understanding and needs further education  HOME EXERCISE PROGRAM: Access Code: [email or web address] URL: [email or web address] Date: 06/08/2023 Prepared by: Ivery Quale  Exercises - Supine Lower Trunk Rotation  - 2 x daily - 6 x weekly - 1 sets - 10 reps - 5 sec hold - Supine Single Knee to Chest Stretch  - 2 x daily - 6 x weekly - 1 sets - 2 reps - 30 sec hold - Supine Double Knee to Chest  - 2 x daily - 6 x weekly - 1 sets - 3  reps - 30 sec hold - Standing 'L' Stretch at Counter  - 2 x daily - 6 x weekly - 1 sets - 10 reps - 10 sec hold - Standing Tandem Balance with Counter Support  - 2 x daily - 6 x weekly - 1 sets - 3 reps - 30 sec hold - Walking with Head Rotation  - 2 x daily - 6 x weekly - 3 reps - Walking with Head Nod  - 2 x daily - 6 x weekly - 3 reps - Feet Together Balance at [employer] Eyes Closed  - 2 x daily - 6 x weekly - 1 sets - 3 reps - 3- sec hold  ASSESSMENT:  CLINICAL IMPRESSION:Her back pain was overall better today but she did fatigued from her session. We will monitor for any soreness from this and adjust accordingly.  She will benefit from skilled PT address her functional deficits.  OBJECTIVE IMPAIRMENTS: decreased activity tolerance, decreased balance, decreased coordination, decreased endurance, difficulty walking, decreased ROM, decreased strength, improper body mechanics, postural dysfunction, obesity, and pain.   ACTIVITY LIMITATIONS: carrying, lifting, bending, sitting, standing, squatting, sleeping, stairs, and transfers  PARTICIPATION LIMITATIONS: meal prep, cleaning, laundry, and community activity  PERSONAL FACTORS: Past/current experiences, Time since onset of injury/illness/exacerbation, and 3+ comorbidities: Lt rTSA 2022, anxiety, breast cancer with lumpectomy and radioactive seed placement 2021 and recent port removal, chronic pain, concussion 2008, HTN, depression, spinal fusion 2012, alcohol abuse  are also affecting patient's functional outcome.   REHAB POTENTIAL: Good  CLINICAL DECISION MAKING: Evolving/moderate complexity  EVALUATION COMPLEXITY: Moderate    GOALS: Short term PT Goals Target date: 07/06/2023   Pt will be I and compliant with HEP. Baseline:  Goal status: New Pt will decrease pain by 25% overall Baseline: Goal status: New  Long term PT goals Target date:08/31/2023   Pt will improve lumbar ROM to [facility] to improve functional  mobility Baseline: Goal status: New Pt will improve  hip/knee strength to at least 4+/5 MMT to improve functional strength Baseline: Goal status: New Pt will improve FOTO to at least 45% functional to show improved function Baseline: Goal status: New Pt will reduce back pain by overall 50% overall with usual activity Baseline: Goal status: New Pt will improve BERG balance test to 45 points to show improved balance. Baseline: Goal status: New  PLAN: PT FREQUENCY: 1-2 times per week   PT DURATION: 6-12 weeks  PLANNED INTERVENTIONS (unless contraindicated): aquatic PT, Canalith repositioning, cryotherapy, Electrical stimulation, Iontophoresis with 4 mg/ml dexamethasome, Moist heat, traction, Ultrasound, gait training, Therapeutic exercise, balance training, neuromuscular re-education, patient/family education, prosthetic training, manual techniques, passive ROM, dry needling, taping, vasopnuematic device, vestibular, spinal manipulations, joint manipulations  PLAN FOR NEXT SESSION: flexion based stretching program, general strength and endurance, balance.    April Manson, PT,DPT 06/14/2023, 4:13 PM  Referring diagnosis? M48.00 (ICD-10-CM) - Spinal stenosis, site unspecified Treatment diagnosis? (if different than referring diagnosis) M54.59 What was this (referring dx) caused by? []  Surgery [x]  Fall [x]  Ongoing issue [x]  Arthritis []  Other: ____________  Laterality: []  Rt []  Lt [x]  Both  Check all possible CPT codes:  *CHOOSE 10 OR LESS*    [x]  97110 (Therapeutic Exercise)  []  92507 (SLP Treatment)  [x]  97112 (Neuro Re-ed)   []  92526 (Swallowing Treatment)   [x]  97116 (Gait Training)   []  [id number] (Cognitive Training, 1st 15 minutes) [x]  97140 (Manual Therapy)   []  40981 (Cognitive Training, each add'l 15 minutes)  []  97164 (Re-evaluation)                              []  Other, List CPT Code ____________  [x]  97530 (Therapeutic Activities)     []  97535 (Self Care)   []   All codes above (97110 - 97535)  [x]  97012 (Mechanical Traction)  [x]  97014 (E-stim Unattended)  []  97032 (E-stim manual)  []  97033 (Ionto)  []  97035 (Ultrasound) []  97750 (Physical Performance Training) []  [id number] (Aquatic Therapy) []  97016 (Vasopneumatic Device) []  [id number] (Paraffin) []  97034 (Contrast Bath) []  97597 (Wound Care 1st 20 sq cm) []  97598 (Wound Care each add'l 20 sq cm) []  97760 (Orthotic Fabrication, Fitting, Training Initial) []  [id number] (Prosthetic Management and Training Initial) []  [id number] (Orthotic or Prosthetic Training/ Modification Subsequent)

## 2023-06-20 ENCOUNTER — Encounter: Payer: Self-pay | Admitting: Physical Therapy

## 2023-06-20 ENCOUNTER — Ambulatory Visit (INDEPENDENT_AMBULATORY_CARE_PROVIDER_SITE_OTHER): Payer: Medicare PPO | Admitting: Physical Therapy

## 2023-06-20 DIAGNOSIS — R262 Difficulty in walking, not elsewhere classified: Secondary | ICD-10-CM

## 2023-06-20 DIAGNOSIS — M25561 Pain in right knee: Secondary | ICD-10-CM | POA: Diagnosis not present

## 2023-06-20 DIAGNOSIS — M6281 Muscle weakness (generalized): Secondary | ICD-10-CM

## 2023-06-20 DIAGNOSIS — M5459 Other low back pain: Secondary | ICD-10-CM | POA: Diagnosis not present

## 2023-06-20 NOTE — Therapy (Signed)
OUTPATIENT PHYSICAL THERAPY TREATMENT    Patient Name: [name] MRN: [id number] DOB:08-03-1951, 72 y.o., female Today's Date: 06/20/2023  END OF SESSION:  PT End of Session - 06/20/23 1512     Visit Number 3    Number of Visits 12    Date for PT Re-Evaluation 08/03/23    Authorization Type [employer]    PT Start Time 1512    PT Stop Time 1550    PT Time Calculation (min) 38 min    Activity Tolerance Patient tolerated treatment well    Behavior During Therapy WFL for tasks assessed/performed             Past Medical History:  Diagnosis Date   Anxiety    Atrial fibrillation [facility])    sees Dr. Flora Lipps   Bilateral swelling of feet    Cancer (HCC)    breast   Chronic diarrhea    Chronic pain    Concussion 02/2007   ICU x 3 days   Depression    Hypertension    Hypothyroidism    Personal history of chemotherapy    Personal history of radiation therapy    Spinal stenosis    Thyroid disease ?1994   Vitamin D deficiency    Past Surgical History:  Procedure Laterality Date   BREAST BIOPSY Right 12/20/2019   x2   BREAST LUMPECTOMY Right 01/22/2020   BREAST LUMPECTOMY WITH RADIOACTIVE SEED AND SENTINEL LYMPH NODE BIOPSY Right 01/22/2020   Procedure: RIGHT BREAST LUMPECTOMY WITH RADIOACTIVE SEED X2 AND RIGHT SENTINEL LYMPH NODE MAPPING;  Surgeon: Harriette Bouillon, MD;  Location: [facility];  Service: General;  Laterality: Right;   BREAST SURGERY Right 03/1998   breast biopsy, benign   EYE SURGERY Right    PORT-A-CATH REMOVAL Right 04/30/2021   Procedure: REMOVAL PORT-A-CATH;  Surgeon: Harriette Bouillon, MD;  Location: [facility];  Service: General;  Laterality: Right;   PORTACATH PLACEMENT Right 01/22/2020   Procedure: INSERTION PORT-A-CATH WITH ULTRASOUND;  Surgeon: Harriette Bouillon, MD;  Location:  SURGERY CENTER;  Service: General;  Laterality: Right;   PORTACATH PLACEMENT Right 02/28/2020   Procedure: PORT A CATH REVISION;   Surgeon: Harriette Bouillon, MD;  Location: [facility] OR;  Service: General;  Laterality: Right;   REVERSE SHOULDER ARTHROPLASTY Left 02/02/2021   Procedure: LEFT REVERSE SHOULDER ARTHROPLASTY;  Surgeon: Cammy Copa, MD;  Location: [facility] OR;  Service: Orthopedics;  Laterality: Left;   SHOULDER SURGERY Right    SPINAL FUSION  03/04/2011   with ORIF   Patient Active Problem List   Diagnosis Date Noted   Generalized anxiety disorder 11/21/2022   Alcohol abuse with alcohol-induced mood disorder (HCC) 11/21/2022   Alcohol abuse with alcohol-induced anxiety disorder (HCC) 11/21/2022   Pulmonary edema 11/18/2022   Acute respiratory failure with hypoxia and hypercapnia (HCC) 11/17/2022   Acute on chronic diastolic (congestive) heart failure (HCC) 11/17/2022   Arthritis of left shoulder region    S/P reverse total shoulder arthroplasty, 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   Vitamin D deficiency 01/15/2021   Vitamin B12 deficiency 01/15/2021   Primary insomnia 01/15/2021   Morbid obesity (HCC) 01/15/2021   Moderate recurrent major depression (HCC) 01/15/2021   Benzodiazepine dependence (HCC) 01/15/2021   Port-A-Cath in place 07/22/2020   Left leg cellulitis 04/17/2020   Cellulitis of left leg 04/17/2020   Hypokalemia 04/17/2020   Macrocytic anemia 04/17/2020   Anxiety 04/17/2020   Depression 04/17/2020   Chronic diarrhea 04/17/2020   Chemotherapy induced diarrhea 04/17/2020  Malignant neoplasm of upper-inner quadrant of right breast in female, estrogen receptor positive (HCC) 12/26/2019   Encephalopathy 02/02/2018   Hypothyroidism 02/02/2018   AKI (acute kidney injury) (HCC) 02/02/2018   Chronic back pain 02/02/2018   Alcohol abuse 02/02/2018   Weight gain 02/02/2018   Benign essential HTN 02/02/2018    PCP: Rebecka Apley, NP   REFERRING PROVIDER: Merryl Hacker, NP   REFERRING DIAG: M48.00 (ICD-10-CM) - Spinal stenosis, site unspecified  Rationale for Evaluation and Treatment:  Rehabilitation  THERAPY DIAG:  Other low back pain  Muscle weakness (generalized)  Difficulty in walking, not elsewhere classified  Acute pain of right knee  ONSET DATE: Chronic back pain for many years but worse over last 6 months.   SUBJECTIVE:                                                                                                                                                                                           SUBJECTIVE STATEMENT: She relays her back pain is not as bad as last time.   PERTINENT HISTORY:  Lt rTSA 2022, anxiety, breast cancer with lumpectomy and radioactive seed placement 2021 and recent port removal, chronic pain, concussion 2008, HTN, depression, spinal fusion 2012, alcohol abuse  PAIN:  Are you having pain? Yes: NPRS scale: 4/10 Pain location: left side low back Pain description: sharp, ache Aggravating factors: has not been able to pinpoint this, sometimes she just wakes up with pain Relieving factors: sitting  PRECAUTIONS: Fall  WEIGHT BEARING RESTRICTIONS: No  FALLS:  Has patient fallen in last 6 months? Yes. Number of falls 1, fell in hall and broke her wrist. Relays several near misses.    PLOF: Independent with basic ADLs  PATIENT GOALS: reduce pain and improve balance.   NEXT MD VISIT: nothing scheduled  OBJECTIVE:   DIAGNOSTIC FINDINGS:  Lumbar CT 04/08/22  IMPRESSION: 1. No acute osseous abnormality. 2. Adjacent segment disease at L3-4 with mild spinal stenosis and moderate left neural foraminal stenosis, progressed from 2019. 3. Solid L4-5 fusion. 4. 2 cm left adrenal mass with indeterminate noncontrast CT characteristics but likely benign given 2 year size stability.  PATIENT SURVEYS:  Eval: FOTO 38% functional intake, goal is 45%  SCREENING FOR RED FLAGS: Bowel or bladder incontinence: No  COGNITION: Overall cognitive status: Within functional limits for tasks assessed      POSTURE: slumped  posture   LUMBAR ROM:   AROM eval  Flexion 50%  Extension 25%*  Right lateral flexion 50%*  Left lateral flexion 50%*  Right rotation 50%*  Left rotation 50%   (Blank rows = not tested) *  denotes pain   LOWER EXTREMITY MMT:    MMT tested in sitting Right eval Left eval  Hip flexion 4 4  Hip extension    Hip abduction 4 4  Hip adduction    Hip internal rotation    Hip external rotation    Knee flexion 4+ 4+  Knee extension 4+ 4+  Ankle dorsiflexion 5 5  Ankle plantarflexion 5 5  Ankle inversion    Ankle eversion     (Blank rows = not tested)  LUMBAR SPECIAL TESTS:  Direction preference for lumbar flexion vs extensoin  FUNCTIONAL TESTS:      GAIT: eval Comments: unsteady gait, uses quad cane for assistance  TODAY'S TREATMENT:                                                                                                                              DATE:06/20/23 Nu step L3 X 8 min UE/LE Seated lumbar flexion stretch p ball roll outs 10 sec X 10 Seated pball core isometric shoulder extension 5 sec X 10 Seated pball core isometric hip flexion 5 sec X 10 Leg press machine DL 16# [id number] Balance:  Feet together on airex pad 30 sec X 3  Feet apart on flat level ground eyes closed 20 sec X3  Tandem walk modified, in bars 3 round trips  Walking with head nods in bars 3 round trips    DATE:06/14/23 Nu step L6 X 10 min UE/LE Seated lumbar flexion stretch p ball roll outs 10 sec X 10 Seated pball core isometric shoulder extension 5 sec X 10 Seated pball core isometric hip flexion 5 sec X 10 Leg press machine DL 04# [id number] Balance:  Feet together on airex pad 30 sec X 3  Feet apart on flat level ground eyes closed 30 sec X3  Tandem walk modified, in bars 2 round trips   PATIENT EDUCATION:  Education details: HEP, PT plan of care Person educated: Patient Education method: Explanation, Demonstration, Verbal cues, and Handouts Education comprehension: verbalized  understanding and needs further education  HOME EXERCISE PROGRAM: Access Code: [email or web address] URL: [email or web address] Date: 06/08/2023 Prepared by: Ivery Quale  Exercises - Supine Lower Trunk Rotation  - 2 x daily - 6 x weekly - 1 sets - 10 reps - 5 sec hold - Supine Single Knee to Chest Stretch  - 2 x daily - 6 x weekly - 1 sets - 2 reps - 30 sec hold - Supine Double Knee to Chest  - 2 x daily - 6 x weekly - 1 sets - 3 reps - 30 sec hold - Standing 'L' Stretch at Counter  - 2 x daily - 6 x weekly - 1 sets - 10 reps - 10 sec hold - Standing Tandem Balance with Counter Support  - 2 x daily - 6 x weekly - 1 sets - 3 reps - 30 sec hold - Walking with Head Rotation  - 2 x daily -  6 x weekly - 3 reps - Walking with Head Nod  - 2 x daily - 6 x weekly - 3 reps - Feet Together Balance at [employer] Eyes Closed  - 2 x daily - 6 x weekly - 1 sets - 3 reps - 3- sec hold  ASSESSMENT:  CLINICAL IMPRESSION:She was able to perform more activity today with less overall fatigue. She will benefit from skilled PT address her functional deficits.   OBJECTIVE IMPAIRMENTS: decreased activity tolerance, decreased balance, decreased coordination, decreased endurance, difficulty walking, decreased ROM, decreased strength, improper body mechanics, postural dysfunction, obesity, and pain.   ACTIVITY LIMITATIONS: carrying, lifting, bending, sitting, standing, squatting, sleeping, stairs, and transfers  PARTICIPATION LIMITATIONS: meal prep, cleaning, laundry, and community activity  PERSONAL FACTORS: Past/current experiences, Time since onset of injury/illness/exacerbation, and 3+ comorbidities: Lt rTSA 2022, anxiety, breast cancer with lumpectomy and radioactive seed placement 2021 and recent port removal, chronic pain, concussion 2008, HTN, depression, spinal fusion 2012, alcohol abuse  are also affecting patient's functional outcome.   REHAB POTENTIAL: Good  CLINICAL DECISION MAKING:  Evolving/moderate complexity  EVALUATION COMPLEXITY: Moderate    GOALS: Short term PT Goals Target date: 07/06/2023   Pt will be I and compliant with HEP. Baseline:  Goal status: New Pt will decrease pain by 25% overall Baseline: Goal status: New  Long term PT goals Target date:08/31/2023   Pt will improve lumbar ROM to [facility] to improve functional mobility Baseline: Goal status: New Pt will improve  hip/knee strength to at least 4+/5 MMT to improve functional strength Baseline: Goal status: New Pt will improve FOTO to at least 45% functional to show improved function Baseline: Goal status: New Pt will reduce back pain by overall 50% overall with usual activity Baseline: Goal status: New Pt will improve BERG balance test to 45 points to show improved balance. Baseline: Goal status: New  PLAN: PT FREQUENCY: 1-2 times per week   PT DURATION: 6-12 weeks  PLANNED INTERVENTIONS (unless contraindicated): aquatic PT, Canalith repositioning, cryotherapy, Electrical stimulation, Iontophoresis with 4 mg/ml dexamethasome, Moist heat, traction, Ultrasound, gait training, Therapeutic exercise, balance training, neuromuscular re-education, patient/family education, prosthetic training, manual techniques, passive ROM, dry needling, taping, vasopnuematic device, vestibular, spinal manipulations, joint manipulations  PLAN FOR NEXT SESSION: flexion based stretching program, general strength and endurance, balance.    April Manson, PT,DPT 06/20/2023, 3:14 PM  Referring diagnosis? M48.00 (ICD-10-CM) - Spinal stenosis, site unspecified Treatment diagnosis? (if different than referring diagnosis) M54.59 What was this (referring dx) caused by? []  Surgery [x]  Fall [x]  Ongoing issue [x]  Arthritis []  Other: ____________  Laterality: []  Rt []  Lt [x]  Both  Check all possible CPT codes:  *CHOOSE 10 OR LESS*    [x]  97110 (Therapeutic Exercise)  []  16109 (SLP Treatment)  [x]  97112  (Neuro Re-ed)   []  92526 (Swallowing Treatment)   [x]  97116 (Gait Training)   []  [id number] (Cognitive Training, 1st 15 minutes) [x]  97140 (Manual Therapy)   []  97130 (Cognitive Training, each add'l 15 minutes)  []  97164 (Re-evaluation)                              []  Other, List CPT Code ____________  [x]  97530 (Therapeutic Activities)     []  97535 (Self Care)   []  All codes above (97110 - 97535)  [x]  97012 (Mechanical Traction)  [x]  97014 (E-stim Unattended)  []  97032 (E-stim manual)  []  97033 (Ionto)  []  97035 (Ultrasound) []   16109 (Physical Performance Training) []  [id number] (Aquatic Therapy) []  97016 (Vasopneumatic Device) []  [id number] (Paraffin) []  97034 (Contrast Bath) []  97597 (Wound Care 1st 20 sq cm) []  97598 (Wound Care each add'l 20 sq cm) []  97760 (Orthotic Fabrication, Fitting, Training Initial) []  [id number] (Prosthetic Management and Training Initial) []  [id number] (Orthotic or Prosthetic Training/ Modification Subsequent)

## 2023-06-22 ENCOUNTER — Encounter: Payer: Medicare PPO | Admitting: Physical Therapy

## 2023-06-27 ENCOUNTER — Ambulatory Visit: Payer: Medicare PPO | Admitting: Physical Therapy

## 2023-06-27 ENCOUNTER — Encounter: Payer: Self-pay | Admitting: Physical Therapy

## 2023-06-27 DIAGNOSIS — M5459 Other low back pain: Secondary | ICD-10-CM

## 2023-06-27 DIAGNOSIS — M6281 Muscle weakness (generalized): Secondary | ICD-10-CM | POA: Diagnosis not present

## 2023-06-27 DIAGNOSIS — M25561 Pain in right knee: Secondary | ICD-10-CM

## 2023-06-27 DIAGNOSIS — R262 Difficulty in walking, not elsewhere classified: Secondary | ICD-10-CM

## 2023-06-27 DIAGNOSIS — Z9181 History of falling: Secondary | ICD-10-CM

## 2023-06-27 DIAGNOSIS — R2689 Other abnormalities of gait and mobility: Secondary | ICD-10-CM

## 2023-06-27 NOTE — Therapy (Signed)
OUTPATIENT PHYSICAL THERAPY TREATMENT    Patient Name: [name] MRN: [id number] DOB:25-Jan-1951, 72 y.o., female Today's Date: 06/27/2023  END OF SESSION:  PT End of Session - 06/27/23 1540     Visit Number 4    Number of Visits 12    Date for PT Re-Evaluation 08/03/23    Authorization Type [employer]    PT Start Time 1515    PT Stop Time 1553    PT Time Calculation (min) 38 min    Activity Tolerance Patient tolerated treatment well    Behavior During Therapy WFL for tasks assessed/performed              Past Medical History:  Diagnosis Date   Anxiety    Atrial fibrillation [facility])    sees Dr. Flora Lipps   Bilateral swelling of feet    Cancer (HCC)    breast   Chronic diarrhea    Chronic pain    Concussion 02/2007   ICU x 3 days   Depression    Hypertension    Hypothyroidism    Personal history of chemotherapy    Personal history of radiation therapy    Spinal stenosis    Thyroid disease ?1994   Vitamin D deficiency    Past Surgical History:  Procedure Laterality Date   BREAST BIOPSY Right 12/20/2019   x2   BREAST LUMPECTOMY Right 01/22/2020   BREAST LUMPECTOMY WITH RADIOACTIVE SEED AND SENTINEL LYMPH NODE BIOPSY Right 01/22/2020   Procedure: RIGHT BREAST LUMPECTOMY WITH RADIOACTIVE SEED X2 AND RIGHT SENTINEL LYMPH NODE MAPPING;  Surgeon: Harriette Bouillon, MD;  Location: [facility];  Service: General;  Laterality: Right;   BREAST SURGERY Right 03/1998   breast biopsy, benign   EYE SURGERY Right    PORT-A-CATH REMOVAL Right 04/30/2021   Procedure: REMOVAL PORT-A-CATH;  Surgeon: Harriette Bouillon, MD;  Location: [facility];  Service: General;  Laterality: Right;   PORTACATH PLACEMENT Right 01/22/2020   Procedure: INSERTION PORT-A-CATH WITH ULTRASOUND;  Surgeon: Harriette Bouillon, MD;  Location: [facility];  Service: General;  Laterality: Right;   PORTACATH PLACEMENT Right 02/28/2020   Procedure: PORT A CATH REVISION;   Surgeon: Harriette Bouillon, MD;  Location: [facility];  Service: General;  Laterality: Right;   REVERSE SHOULDER ARTHROPLASTY Left 02/02/2021   Procedure: LEFT REVERSE SHOULDER ARTHROPLASTY;  Surgeon: Cammy Copa, MD;  Location: [facility];  Service: Orthopedics;  Laterality: Left;   SHOULDER SURGERY Right    SPINAL FUSION  03/04/2011   with ORIF   Patient Active Problem List   Diagnosis Date Noted   Generalized anxiety disorder 11/21/2022   Alcohol abuse with alcohol-induced mood disorder (HCC) 11/21/2022   Alcohol abuse with alcohol-induced anxiety disorder (HCC) 11/21/2022   Pulmonary edema 11/18/2022   Acute respiratory failure with hypoxia and hypercapnia (HCC) 11/17/2022   Acute on chronic diastolic (congestive) heart failure (HCC) 11/17/2022   Arthritis of left shoulder region    S/P reverse total shoulder arthroplasty, 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   Vitamin D deficiency 01/15/2021   Vitamin B12 deficiency 01/15/2021   Primary insomnia 01/15/2021   Morbid obesity (HCC) 01/15/2021   Moderate recurrent major depression (HCC) 01/15/2021   Benzodiazepine dependence (HCC) 01/15/2021   Port-A-Cath in place 07/22/2020   Left leg cellulitis 04/17/2020   Cellulitis of left leg 04/17/2020   Hypokalemia 04/17/2020   Macrocytic anemia 04/17/2020   Anxiety 04/17/2020   Depression 04/17/2020   Chronic diarrhea 04/17/2020   Chemotherapy induced diarrhea  04/17/2020   Malignant neoplasm of upper-inner quadrant of right breast in female, estrogen receptor positive (HCC) 12/26/2019   Encephalopathy 02/02/2018   Hypothyroidism 02/02/2018   AKI (acute kidney injury) (HCC) 02/02/2018   Chronic back pain 02/02/2018   Alcohol abuse 02/02/2018   Weight gain 02/02/2018   Benign essential HTN 02/02/2018    PCP: Rebecka Apley, NP   REFERRING PROVIDER: Merryl Hacker, NP   REFERRING DIAG: M48.00 (ICD-10-CM) - Spinal stenosis, site unspecified  Rationale for Evaluation and Treatment:  Rehabilitation  THERAPY DIAG:  Other low back pain  Muscle weakness (generalized)  Difficulty in walking, not elsewhere classified  Acute pain of right knee  History of falling  Other abnormalities of gait and mobility  ONSET DATE: Chronic back pain for many years but worse over last 6 months.   SUBJECTIVE:                                                                                                                                                                                           SUBJECTIVE STATEMENT: She relays her back pain is not as bad as last time.   PERTINENT HISTORY:  Lt rTSA 2022, anxiety, breast cancer with lumpectomy and radioactive seed placement 2021 and recent port removal, chronic pain, concussion 2008, HTN, depression, spinal fusion 2012, alcohol abuse  PAIN:  Are you having pain? Yes: NPRS scale: 3/10 Pain location: left side low back Pain description: sharp, ache Aggravating factors: has not been able to pinpoint this, sometimes she just wakes up with pain Relieving factors: sitting  PRECAUTIONS: Fall  WEIGHT BEARING RESTRICTIONS: No  FALLS:  Has patient fallen in last 6 months? Yes. Number of falls 1, fell in hall and broke her wrist. Relays several near misses.    PLOF: Independent with basic ADLs  PATIENT GOALS: reduce pain and improve balance.   NEXT MD VISIT: nothing scheduled  OBJECTIVE:   DIAGNOSTIC FINDINGS:  Lumbar CT 04/08/22  IMPRESSION: 1. No acute osseous abnormality. 2. Adjacent segment disease at L3-4 with mild spinal stenosis and moderate left neural foraminal stenosis, progressed from 2019. 3. Solid L4-5 fusion. 4. 2 cm left adrenal mass with indeterminate noncontrast CT characteristics but likely benign given 2 year size stability.  PATIENT SURVEYS:  Eval: FOTO 38% functional intake, goal is 45%  SCREENING FOR RED FLAGS: Bowel or bladder incontinence: No  COGNITION: Overall cognitive status: Within  functional limits for tasks assessed      POSTURE: slumped posture   LUMBAR ROM:   AROM eval  Flexion 50%  Extension 25%*  Right lateral flexion 50%*  Left lateral flexion 50%*  Right rotation 50%*  Left rotation 50%   (Blank rows = not tested) *denotes pain   LOWER EXTREMITY MMT:    MMT tested in sitting Right eval Left eval  Hip flexion 4 4  Hip extension    Hip abduction 4 4  Hip adduction    Hip internal rotation    Hip external rotation    Knee flexion 4+ 4+  Knee extension 4+ 4+  Ankle dorsiflexion 5 5  Ankle plantarflexion 5 5  Ankle inversion    Ankle eversion     (Blank rows = not tested)  LUMBAR SPECIAL TESTS:  Direction preference for lumbar flexion vs extensoin  FUNCTIONAL TESTS:      GAIT: eval Comments: unsteady gait, uses quad cane for assistance  TODAY'S TREATMENT:                                                                                                                              DATE:06/27/23 Nu step L3 X 8 min UE/LE Seated lumbar flexion stretch p ball roll outs 10 sec X 10 Seated pball core isometric shoulder extension 5 sec X 10 Seated pball core isometric hip flexion 5 sec X 10 Leg press machine DL 34# [id number] Balance:  Feet together on airex pad 30 sec X 3  Feet apart on flat level ground eyes closed 20 sec X3  Tandem walk modified, in bars 3 round trips  Walking with head nods in bars 3 round trips  Walking backwards in bars 3 round trips     PATIENT EDUCATION:  Education details: HEP, PT plan of care Person educated: Patient Education method: Explanation, Demonstration, Verbal cues, and Handouts Education comprehension: verbalized understanding and needs further education  HOME EXERCISE PROGRAM: Access Code: [email or web address] URL: [email or web address] Date: 06/08/2023 Prepared by: Ivery Quale  Exercises - Supine Lower Trunk Rotation  - 2 x daily - 6 x weekly - 1 sets - 10 reps - 5 sec hold - Supine  Single Knee to Chest Stretch  - 2 x daily - 6 x weekly - 1 sets - 2 reps - 30 sec hold - Supine Double Knee to Chest  - 2 x daily - 6 x weekly - 1 sets - 3 reps - 30 sec hold - Standing 'L' Stretch at Counter  - 2 x daily - 6 x weekly - 1 sets - 10 reps - 10 sec hold - Standing Tandem Balance with Counter Support  - 2 x daily - 6 x weekly - 1 sets - 3 reps - 30 sec hold - Walking with Head Rotation  - 2 x daily - 6 x weekly - 3 reps - Walking with Head Nod  - 2 x daily - 6 x weekly - 3 reps - Feet Together Balance at [employer] Eyes Closed  - 2 x daily - 6 x weekly - 1 sets - 3 reps - 3- sec hold  ASSESSMENT:  CLINICAL IMPRESSION:She was  able to progress her standing activity before needing rest break. Overall less back pain today during her exercise session. She will benefit from skilled PT address her functional deficits.   OBJECTIVE IMPAIRMENTS: decreased activity tolerance, decreased balance, decreased coordination, decreased endurance, difficulty walking, decreased ROM, decreased strength, improper body mechanics, postural dysfunction, obesity, and pain.   ACTIVITY LIMITATIONS: carrying, lifting, bending, sitting, standing, squatting, sleeping, stairs, and transfers  PARTICIPATION LIMITATIONS: meal prep, cleaning, laundry, and community activity  PERSONAL FACTORS: Past/current experiences, Time since onset of injury/illness/exacerbation, and 3+ comorbidities: Lt rTSA 2022, anxiety, breast cancer with lumpectomy and radioactive seed placement 2021 and recent port removal, chronic pain, concussion 2008, HTN, depression, spinal fusion 2012, alcohol abuse  are also affecting patient's functional outcome.   REHAB POTENTIAL: Good  CLINICAL DECISION MAKING: Evolving/moderate complexity  EVALUATION COMPLEXITY: Moderate    GOALS: Short term PT Goals Target date: 07/06/2023   Pt will be I and compliant with HEP. Baseline:  Goal status: ongoing 06/27/23 Pt will decrease pain by 25%  overall Baseline: Goal status: ongoing 06/27/23  Long term PT goals Target date:08/31/2023   Pt will improve lumbar ROM to [facility] to improve functional mobility Baseline: Goal status: New Pt will improve  hip/knee strength to at least 4+/5 MMT to improve functional strength Baseline: Goal status: New Pt will improve FOTO to at least 45% functional to show improved function Baseline: Goal status: New Pt will reduce back pain by overall 50% overall with usual activity Baseline: Goal status: New Pt will improve BERG balance test to 45 points to show improved balance. Baseline: Goal status: New  PLAN: PT FREQUENCY: 1-2 times per week   PT DURATION: 6-12 weeks  PLANNED INTERVENTIONS (unless contraindicated): aquatic PT, Canalith repositioning, cryotherapy, Electrical stimulation, Iontophoresis with 4 mg/ml dexamethasome, Moist heat, traction, Ultrasound, gait training, Therapeutic exercise, balance training, neuromuscular re-education, patient/family education, prosthetic training, manual techniques, passive ROM, dry needling, taping, vasopnuematic device, vestibular, spinal manipulations, joint manipulations  PLAN FOR NEXT SESSION: flexion based stretching program, general strength and endurance, balance.    April Manson, PT,DPT 06/27/2023, 3:43 PM  Referring diagnosis? M48.00 (ICD-10-CM) - Spinal stenosis, site unspecified Treatment diagnosis? (if different than referring diagnosis) M54.59 What was this (referring dx) caused by? []  Surgery [x]  Fall [x]  Ongoing issue [x]  Arthritis []  Other: ____________  Laterality: []  Rt []  Lt [x]  Both  Check all possible CPT codes:  *CHOOSE 10 OR LESS*    [x]  97110 (Therapeutic Exercise)  []  92507 (SLP Treatment)  [x]  97112 (Neuro Re-ed)   []  92526 (Swallowing Treatment)   [x]  97116 (Gait Training)   []  [id number] (Cognitive Training, 1st 15 minutes) [x]  97140 (Manual Therapy)   []  97130 (Cognitive Training, each add'l 15 minutes)  []  97164  (Re-evaluation)                              []  Other, List CPT Code ____________  [x]  16109 (Therapeutic Activities)     []  97535 (Self Care)   []  All codes above (97110 - 97535)  [x]  97012 (Mechanical Traction)  [x]  97014 (E-stim Unattended)  []  97032 (E-stim manual)  []  97033 (Ionto)  []  97035 (Ultrasound) []  97750 (Physical Performance Training) []  [id number] (Aquatic Therapy) []  97016 (Vasopneumatic Device) []  [id number] (Paraffin) []  97034 (Contrast Bath) []  97597 (Wound Care 1st 20 sq cm) []  97598 (Wound Care each add'l 20 sq cm) []  97760 (Orthotic Fabrication, Fitting, Training Initial) []  [id number] (  Prosthetic Management and Training Initial) []  [phone] (Orthotic or Prosthetic Training/ Modification Subsequent)

## 2023-06-29 ENCOUNTER — Ambulatory Visit: Payer: Medicare PPO | Admitting: Physical Therapy

## 2023-06-29 ENCOUNTER — Encounter: Payer: Self-pay | Admitting: Physical Therapy

## 2023-06-29 DIAGNOSIS — M6281 Muscle weakness (generalized): Secondary | ICD-10-CM

## 2023-06-29 DIAGNOSIS — R262 Difficulty in walking, not elsewhere classified: Secondary | ICD-10-CM

## 2023-06-29 DIAGNOSIS — M5459 Other low back pain: Secondary | ICD-10-CM | POA: Diagnosis not present

## 2023-06-29 DIAGNOSIS — M25561 Pain in right knee: Secondary | ICD-10-CM

## 2023-06-29 DIAGNOSIS — Z9181 History of falling: Secondary | ICD-10-CM

## 2023-06-29 NOTE — Therapy (Signed)
OUTPATIENT PHYSICAL THERAPY TREATMENT    Patient Name: [name] MRN: [id number] DOB:Sep 18, 1951, 72 y.o., female Today's Date: 06/29/2023  END OF SESSION:  PT End of Session - 06/29/23 1521     Visit Number 5    Number of Visits 12    Date for PT Re-Evaluation 08/03/23    Authorization Type [employer]    PT Start Time 1515    PT Stop Time 1554    PT Time Calculation (min) 39 min    Activity Tolerance Patient tolerated treatment well    Behavior During Therapy WFL for tasks assessed/performed              Past Medical History:  Diagnosis Date   Anxiety    Atrial fibrillation [facility])    sees Dr. Flora Lipps   Bilateral swelling of feet    Cancer (HCC)    breast   Chronic diarrhea    Chronic pain    Concussion 02/2007   ICU x 3 days   Depression    Hypertension    Hypothyroidism    Personal history of chemotherapy    Personal history of radiation therapy    Spinal stenosis    Thyroid disease ?1994   Vitamin D deficiency    Past Surgical History:  Procedure Laterality Date   BREAST BIOPSY Right 12/20/2019   x2   BREAST LUMPECTOMY Right 01/22/2020   BREAST LUMPECTOMY WITH RADIOACTIVE SEED AND SENTINEL LYMPH NODE BIOPSY Right 01/22/2020   Procedure: RIGHT BREAST LUMPECTOMY WITH RADIOACTIVE SEED X2 AND RIGHT SENTINEL LYMPH NODE MAPPING;  Surgeon: Harriette Bouillon, MD;  Location: [facility];  Service: General;  Laterality: Right;   BREAST SURGERY Right 03/1998   breast biopsy, benign   EYE SURGERY Right    PORT-A-CATH REMOVAL Right 04/30/2021   Procedure: REMOVAL PORT-A-CATH;  Surgeon: Harriette Bouillon, MD;  Location: [facility];  Service: General;  Laterality: Right;   PORTACATH PLACEMENT Right 01/22/2020   Procedure: INSERTION PORT-A-CATH WITH ULTRASOUND;  Surgeon: Harriette Bouillon, MD;  Location: [facility];  Service: General;  Laterality: Right;   PORTACATH PLACEMENT Right 02/28/2020   Procedure: PORT A CATH REVISION;   Surgeon: Harriette Bouillon, MD;  Location: [facility];  Service: General;  Laterality: Right;   REVERSE SHOULDER ARTHROPLASTY Left 02/02/2021   Procedure: LEFT REVERSE SHOULDER ARTHROPLASTY;  Surgeon: Cammy Copa, MD;  Location: [facility] OR;  Service: Orthopedics;  Laterality: Left;   SHOULDER SURGERY Right    SPINAL FUSION  03/04/2011   with ORIF   Patient Active Problem List   Diagnosis Date Noted   Generalized anxiety disorder 11/21/2022   Alcohol abuse with alcohol-induced mood disorder (HCC) 11/21/2022   Alcohol abuse with alcohol-induced anxiety disorder (HCC) 11/21/2022   Pulmonary edema 11/18/2022   Acute respiratory failure with hypoxia and hypercapnia (HCC) 11/17/2022   Acute on chronic diastolic (congestive) heart failure (HCC) 11/17/2022   Arthritis of left shoulder region    S/P reverse total shoulder arthroplasty, 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   Vitamin D deficiency 01/15/2021   Vitamin B12 deficiency 01/15/2021   Primary insomnia 01/15/2021   Morbid obesity (HCC) 01/15/2021   Moderate recurrent major depression (HCC) 01/15/2021   Benzodiazepine dependence (HCC) 01/15/2021   Port-A-Cath in place 07/22/2020   Left leg cellulitis 04/17/2020   Cellulitis of left leg 04/17/2020   Hypokalemia 04/17/2020   Macrocytic anemia 04/17/2020   Anxiety 04/17/2020   Depression 04/17/2020   Chronic diarrhea 04/17/2020   Chemotherapy induced diarrhea  04/17/2020   Malignant neoplasm of upper-inner quadrant of right breast in female, estrogen receptor positive (HCC) 12/26/2019   Encephalopathy 02/02/2018   Hypothyroidism 02/02/2018   AKI (acute kidney injury) (HCC) 02/02/2018   Chronic back pain 02/02/2018   Alcohol abuse 02/02/2018   Weight gain 02/02/2018   Benign essential HTN 02/02/2018    PCP: Rebecka Apley, NP   REFERRING PROVIDER: Merryl Hacker, NP   REFERRING DIAG: M48.00 (ICD-10-CM) - Spinal stenosis, site unspecified  Rationale for Evaluation and Treatment:  Rehabilitation  THERAPY DIAG:  Other low back pain  Muscle weakness (generalized)  Difficulty in walking, not elsewhere classified  Acute pain of right knee  History of falling  ONSET DATE: Chronic back pain for many years but worse over last 6 months.   SUBJECTIVE:                                                                                                                                                                                           SUBJECTIVE STATEMENT: She relays her back pain is doing okay today.   PERTINENT HISTORY:  Lt rTSA 2022, anxiety, breast cancer with lumpectomy and radioactive seed placement 2021 and recent port removal, chronic pain, concussion 2008, HTN, depression, spinal fusion 2012, alcohol abuse  PAIN:  Are you having pain? Yes: NPRS scale: 3/10 Pain location: left side low back Pain description: sharp, ache Aggravating factors: has not been able to pinpoint this, sometimes she just wakes up with pain Relieving factors: sitting  PRECAUTIONS: Fall  WEIGHT BEARING RESTRICTIONS: No  FALLS:  Has patient fallen in last 6 months? Yes. Number of falls 1, fell in hall and broke her wrist. Relays several near misses.    PLOF: Independent with basic ADLs  PATIENT GOALS: reduce pain and improve balance.   NEXT MD VISIT: nothing scheduled  OBJECTIVE:   DIAGNOSTIC FINDINGS:  Lumbar CT 04/08/22  IMPRESSION: 1. No acute osseous abnormality. 2. Adjacent segment disease at L3-4 with mild spinal stenosis and moderate left neural foraminal stenosis, progressed from 2019. 3. Solid L4-5 fusion. 4. 2 cm left adrenal mass with indeterminate noncontrast CT characteristics but likely benign given 2 year size stability.  PATIENT SURVEYS:  Eval: FOTO 38% functional intake, goal is 45%  SCREENING FOR RED FLAGS: Bowel or bladder incontinence: No  COGNITION: Overall cognitive status: Within functional limits for tasks assessed      POSTURE:  slumped posture   LUMBAR ROM:   AROM eval  Flexion 50%  Extension 25%*  Right lateral flexion 50%*  Left lateral flexion 50%*  Right rotation 50%*  Left rotation 50%   (Blank  rows = not tested) *denotes pain   LOWER EXTREMITY MMT:    MMT tested in sitting Right eval Left eval  Hip flexion 4 4  Hip extension    Hip abduction 4 4  Hip adduction    Hip internal rotation    Hip external rotation    Knee flexion 4+ 4+  Knee extension 4+ 4+  Ankle dorsiflexion 5 5  Ankle plantarflexion 5 5  Ankle inversion    Ankle eversion     (Blank rows = not tested)  LUMBAR SPECIAL TESTS:  Direction preference for lumbar flexion vs extensoin  FUNCTIONAL TESTS:      GAIT: eval Comments: unsteady gait, uses quad cane for assistance  TODAY'S TREATMENT:                                                                                                                              DATE:06/29/23 Nu step L6 X 8 min UE/LE Seated lumbar flexion stretch p ball roll outs 10 sec X 10 Seated pball core isometric shoulder extension 5 sec X 10 Seated pball core isometric hip flexion 5 sec X 10 Leg press machine DL 16# [id number], then SL 04# X 15 each side Balance:  Feet together on airex pad 30 sec X 3  Feet apart on flat level ground eyes closed 20 sec X3  Tandem walk modified, in bars 3 round trips  Walking with head nods in bars 3 round trips  Walking with head rotations in bars 3 trips  Walking backwards in bars 3 round trips  Sidestepping without UE support in bars 3 trips  DATE:06/27/23 Nu step L3 X 8 min UE/LE Seated lumbar flexion stretch p ball roll outs 10 sec X 10 Seated pball core isometric shoulder extension 5 sec X 10 Seated pball core isometric hip flexion 5 sec X 10 Leg press machine DL 54# [id number] Balance:  Feet together on airex pad 30 sec X 3  Feet apart on flat level ground eyes closed 20 sec X3  Tandem walk modified, in bars 3 round trips  Walking with head nods in bars 3  round trips  Walking backwards in bars 3 round trips     PATIENT EDUCATION:  Education details: HEP, PT plan of care Person educated: Patient Education method: Explanation, Demonstration, Verbal cues, and Handouts Education comprehension: verbalized understanding and needs further education  HOME EXERCISE PROGRAM: Access Code: [email or web address] URL: [email or web address] Date: 06/08/2023 Prepared by: Ivery Quale  Exercises - Supine Lower Trunk Rotation  - 2 x daily - 6 x weekly - 1 sets - 10 reps - 5 sec hold - Supine Single Knee to Chest Stretch  - 2 x daily - 6 x weekly - 1 sets - 2 reps - 30 sec hold - Supine Double Knee to Chest  - 2 x daily - 6 x weekly - 1 sets - 3 reps - 30 sec hold - Standing 'L' Stretch at [employer]  -  2 x daily - 6 x weekly - 1 sets - 10 reps - 10 sec hold - Standing Tandem Balance with Counter Support  - 2 x daily - 6 x weekly - 1 sets - 3 reps - 30 sec hold - Walking with Head Rotation  - 2 x daily - 6 x weekly - 3 reps - Walking with Head Nod  - 2 x daily - 6 x weekly - 3 reps - Feet Together Balance at [employer] Eyes Closed  - 2 x daily - 6 x weekly - 1 sets - 3 reps - 3- sec hold  ASSESSMENT:  CLINICAL IMPRESSION:She showed some improved endurance today and did not need as many rest breaks or become as short of breath. PT recommending to continue with current plan.   OBJECTIVE IMPAIRMENTS: decreased activity tolerance, decreased balance, decreased coordination, decreased endurance, difficulty walking, decreased ROM, decreased strength, improper body mechanics, postural dysfunction, obesity, and pain.   ACTIVITY LIMITATIONS: carrying, lifting, bending, sitting, standing, squatting, sleeping, stairs, and transfers  PARTICIPATION LIMITATIONS: meal prep, cleaning, laundry, and community activity  PERSONAL FACTORS: Past/current experiences, Time since onset of injury/illness/exacerbation, and 3+ comorbidities: Lt rTSA 2022, anxiety, breast cancer  with lumpectomy and radioactive seed placement 2021 and recent port removal, chronic pain, concussion 2008, HTN, depression, spinal fusion 2012, alcohol abuse  are also affecting patient's functional outcome.   REHAB POTENTIAL: Good  CLINICAL DECISION MAKING: Evolving/moderate complexity  EVALUATION COMPLEXITY: Moderate    GOALS: Short term PT Goals Target date: 07/06/2023   Pt will be I and compliant with HEP. Baseline:  Goal status: ongoing 06/27/23 Pt will decrease pain by 25% overall Baseline: Goal status: ongoing 06/27/23  Long term PT goals Target date:08/31/2023   Pt will improve lumbar ROM to [facility] to improve functional mobility Baseline: Goal status: New Pt will improve  hip/knee strength to at least 4+/5 MMT to improve functional strength Baseline: Goal status: New Pt will improve FOTO to at least 45% functional to show improved function Baseline: Goal status: New Pt will reduce back pain by overall 50% overall with usual activity Baseline: Goal status: New Pt will improve BERG balance test to 45 points to show improved balance. Baseline: Goal status: New  PLAN: PT FREQUENCY: 1-2 times per week   PT DURATION: 6-12 weeks  PLANNED INTERVENTIONS (unless contraindicated): aquatic PT, Canalith repositioning, cryotherapy, Electrical stimulation, Iontophoresis with 4 mg/ml dexamethasome, Moist heat, traction, Ultrasound, gait training, Therapeutic exercise, balance training, neuromuscular re-education, patient/family education, prosthetic training, manual techniques, passive ROM, dry needling, taping, vasopnuematic device, vestibular, spinal manipulations, joint manipulations  PLAN FOR NEXT SESSION: flexion based stretching program, general strength and endurance, balance.    April Manson, PT,DPT 06/29/2023, 3:54 PM  Referring diagnosis? M48.00 (ICD-10-CM) - Spinal stenosis, site unspecified Treatment diagnosis? (if different than referring diagnosis) M54.59 What  was this (referring dx) caused by? []  Surgery [x]  Fall [x]  Ongoing issue [x]  Arthritis []  Other: ____________  Laterality: []  Rt []  Lt [x]  Both  Check all possible CPT codes:  *CHOOSE 10 OR LESS*    [x]  16109 (Therapeutic Exercise)  []  92507 (SLP Treatment)  [x]  97112 (Neuro Re-ed)   []  92526 (Swallowing Treatment)   [x]  97116 (Gait Training)   []  [id number] (Cognitive Training, 1st 15 minutes) [x]  97140 (Manual Therapy)   []  97130 (Cognitive Training, each add'l 15 minutes)  []  97164 (Re-evaluation)                              []   Other, List CPT Code ____________  [x]  97530 (Therapeutic Activities)     []  97535 (Self Care)   []  All codes above (97110 - 97535)  [x]  97012 (Mechanical Traction)  [x]  16109 (E-stim Unattended)  []  97032 (E-stim manual)  []  97033 (Ionto)  []  97035 (Ultrasound) []  97750 (Physical Performance Training) []  [id number] (Aquatic Therapy) []  97016 (Vasopneumatic Device) []  [id number] (Paraffin) []  97034 (Contrast Bath) []  97597 (Wound Care 1st 20 sq cm) []  97598 (Wound Care each add'l 20 sq cm) []  97760 (Orthotic Fabrication, Fitting, Training Initial) []  [id number] (Prosthetic Management and Training Initial) []  [id number] (Orthotic or Prosthetic Training/ Modification Subsequent)

## 2023-07-04 ENCOUNTER — Encounter: Payer: Self-pay | Admitting: Physical Therapy

## 2023-07-04 ENCOUNTER — Ambulatory Visit: Payer: Medicare PPO | Admitting: Physical Therapy

## 2023-07-04 DIAGNOSIS — Z9181 History of falling: Secondary | ICD-10-CM

## 2023-07-04 DIAGNOSIS — M5459 Other low back pain: Secondary | ICD-10-CM | POA: Diagnosis not present

## 2023-07-04 DIAGNOSIS — M6281 Muscle weakness (generalized): Secondary | ICD-10-CM | POA: Diagnosis not present

## 2023-07-04 DIAGNOSIS — M25561 Pain in right knee: Secondary | ICD-10-CM

## 2023-07-04 DIAGNOSIS — R262 Difficulty in walking, not elsewhere classified: Secondary | ICD-10-CM

## 2023-07-04 NOTE — Therapy (Signed)
OUTPATIENT PHYSICAL THERAPY TREATMENT    Patient Name: [name] MRN: [id number] DOB:25-Jun-1951, 72 y.o., female Today's Date: 07/04/2023  END OF SESSION:  PT End of Session - 07/04/23 1523     Visit Number 6    Number of Visits 12    Date for PT Re-Evaluation 08/03/23    Authorization Type [employer]    PT Start Time 1515    PT Stop Time 1554    PT Time Calculation (min) 39 min    Activity Tolerance Patient tolerated treatment well    Behavior During Therapy WFL for tasks assessed/performed              Past Medical History:  Diagnosis Date   Anxiety    Atrial fibrillation [facility])    sees Dr. Flora Lipps   Bilateral swelling of feet    Cancer (HCC)    breast   Chronic diarrhea    Chronic pain    Concussion 02/2007   ICU x 3 days   Depression    Hypertension    Hypothyroidism    Personal history of chemotherapy    Personal history of radiation therapy    Spinal stenosis    Thyroid disease ?1994   Vitamin D deficiency    Past Surgical History:  Procedure Laterality Date   BREAST BIOPSY Right 12/20/2019   x2   BREAST LUMPECTOMY Right 01/22/2020   BREAST LUMPECTOMY WITH RADIOACTIVE SEED AND SENTINEL LYMPH NODE BIOPSY Right 01/22/2020   Procedure: RIGHT BREAST LUMPECTOMY WITH RADIOACTIVE SEED X2 AND RIGHT SENTINEL LYMPH NODE MAPPING;  Surgeon: Harriette Bouillon, MD;  Location: [facility];  Service: General;  Laterality: Right;   BREAST SURGERY Right 03/1998   breast biopsy, benign   EYE SURGERY Right    PORT-A-CATH REMOVAL Right 04/30/2021   Procedure: REMOVAL PORT-A-CATH;  Surgeon: Harriette Bouillon, MD;  Location: [facility];  Service: General;  Laterality: Right;   PORTACATH PLACEMENT Right 01/22/2020   Procedure: INSERTION PORT-A-CATH WITH ULTRASOUND;  Surgeon: Harriette Bouillon, MD;  Location: [facility];  Service: General;  Laterality: Right;   PORTACATH PLACEMENT Right 02/28/2020   Procedure: PORT A CATH REVISION;   Surgeon: Harriette Bouillon, MD;  Location: [facility] OR;  Service: General;  Laterality: Right;   REVERSE SHOULDER ARTHROPLASTY Left 02/02/2021   Procedure: LEFT REVERSE SHOULDER ARTHROPLASTY;  Surgeon: Cammy Copa, MD;  Location: [facility] OR;  Service: Orthopedics;  Laterality: Left;   SHOULDER SURGERY Right    SPINAL FUSION  03/04/2011   with ORIF   Patient Active Problem List   Diagnosis Date Noted   Generalized anxiety disorder 11/21/2022   Alcohol abuse with alcohol-induced mood disorder (HCC) 11/21/2022   Alcohol abuse with alcohol-induced anxiety disorder (HCC) 11/21/2022   Pulmonary edema 11/18/2022   Acute respiratory failure with hypoxia and hypercapnia (HCC) 11/17/2022   Acute on chronic diastolic (congestive) heart failure (HCC) 11/17/2022   Arthritis of left shoulder region    S/P reverse total shoulder arthroplasty, 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   Vitamin D deficiency 01/15/2021   Vitamin B12 deficiency 01/15/2021   Primary insomnia 01/15/2021   Morbid obesity (HCC) 01/15/2021   Moderate recurrent major depression (HCC) 01/15/2021   Benzodiazepine dependence (HCC) 01/15/2021   Port-A-Cath in place 07/22/2020   Left leg cellulitis 04/17/2020   Cellulitis of left leg 04/17/2020   Hypokalemia 04/17/2020   Macrocytic anemia 04/17/2020   Anxiety 04/17/2020   Depression 04/17/2020   Chronic diarrhea 04/17/2020   Chemotherapy induced diarrhea  04/17/2020   Malignant neoplasm of upper-inner quadrant of right breast in female, estrogen receptor positive (HCC) 12/26/2019   Encephalopathy 02/02/2018   Hypothyroidism 02/02/2018   AKI (acute kidney injury) (HCC) 02/02/2018   Chronic back pain 02/02/2018   Alcohol abuse 02/02/2018   Weight gain 02/02/2018   Benign essential HTN 02/02/2018    PCP: Rebecka Apley, NP   REFERRING PROVIDER: Merryl Hacker, NP   REFERRING DIAG: M48.00 (ICD-10-CM) - Spinal stenosis, site unspecified  Rationale for Evaluation and Treatment:  Rehabilitation  THERAPY DIAG:  Other low back pain  Muscle weakness (generalized)  Difficulty in walking, not elsewhere classified  Acute pain of right knee  History of falling  ONSET DATE: Chronic back pain for many years but worse over last 6 months.   SUBJECTIVE:                                                                                                                                                                                           SUBJECTIVE STATEMENT: She relays her back pain is still doing pretty good.    PERTINENT HISTORY:  Lt rTSA 2022, anxiety, breast cancer with lumpectomy and radioactive seed placement 2021 and recent port removal, chronic pain, concussion 2008, HTN, depression, spinal fusion 2012, alcohol abuse  PAIN:  Are you having pain? Yes: NPRS scale: 2/10 Pain location: left side low back Pain description: sharp, ache Aggravating factors: has not been able to pinpoint this, sometimes she just wakes up with pain Relieving factors: sitting  PRECAUTIONS: Fall  WEIGHT BEARING RESTRICTIONS: No  FALLS:  Has patient fallen in last 6 months? Yes. Number of falls 1, fell in hall and broke her wrist. Relays several near misses.    PLOF: Independent with basic ADLs  PATIENT GOALS: reduce pain and improve balance.   NEXT MD VISIT: nothing scheduled  OBJECTIVE:   DIAGNOSTIC FINDINGS:  Lumbar CT 04/08/22  IMPRESSION: 1. No acute osseous abnormality. 2. Adjacent segment disease at L3-4 with mild spinal stenosis and moderate left neural foraminal stenosis, progressed from 2019. 3. Solid L4-5 fusion. 4. 2 cm left adrenal mass with indeterminate noncontrast CT characteristics but likely benign given 2 year size stability.  PATIENT SURVEYS:  Eval: FOTO 38% functional intake, goal is 45%  SCREENING FOR RED FLAGS: Bowel or bladder incontinence: No  COGNITION: Overall cognitive status: Within functional limits for tasks  assessed      POSTURE: slumped posture   LUMBAR ROM:   AROM eval  Flexion 50%  Extension 25%*  Right lateral flexion 50%*  Left lateral flexion 50%*  Right rotation 50%*  Left rotation 50%   (  Blank rows = not tested) *denotes pain   LOWER EXTREMITY MMT:    MMT tested in sitting Right eval Left eval  Hip flexion 4 4  Hip extension    Hip abduction 4 4  Hip adduction    Hip internal rotation    Hip external rotation    Knee flexion 4+ 4+  Knee extension 4+ 4+  Ankle dorsiflexion 5 5  Ankle plantarflexion 5 5  Ankle inversion    Ankle eversion     (Blank rows = not tested)  LUMBAR SPECIAL TESTS:  Direction preference for lumbar flexion vs extensoin  FUNCTIONAL TESTS:      GAIT: eval Comments: unsteady gait, uses quad cane for assistance  TODAY'S TREATMENT:                                                                                                                              DATE:07/04/23 Nu step L6 X 10 min UE/LE Seated lumbar flexion stretch p ball roll outs 10 sec X 10 Seated pball core isometric shoulder extension 5 sec X 10 Seated pball core isometric hip flexion 5 sec X 10 Leg press deferred today at her request Balance:  Feet together on airex pad 30 sec X 3  Feet apart on flat level ground eyes closed 20 sec X3  Tandem walk modified, in bars 3 round trips  Walking with head nods in bars 3 round trips  Walking with head rotations in bars 3 trips  Walking backwards in bars 3 round trips  Sidestepping without UE support in bars 3 trips  DATE:06/29/23 Nu step L6 X 8 min UE/LE Seated lumbar flexion stretch p ball roll outs 10 sec X 10 Seated pball core isometric shoulder extension 5 sec X 10 Seated pball core isometric hip flexion 5 sec X 10 Leg press machine DL 40# 9W11, then SL 91# X 15 each side Balance:  Feet together on airex pad 30 sec X 3  Feet apart on flat level ground eyes closed 20 sec X3  Tandem walk modified, in bars 3 round  trips  Walking with head nods in bars 3 round trips  Walking with head rotations in bars 3 trips  Walking backwards in bars 3 round trips  Sidestepping without UE support in bars 3 trips      PATIENT EDUCATION:  Education details: HEP, PT plan of care Person educated: Patient Education method: Explanation, Demonstration, Verbal cues, and Handouts Education comprehension: verbalized understanding and needs further education  HOME EXERCISE PROGRAM: Access Code: [email or web address] URL: [email or web address] Date: 06/08/2023 Prepared by: Ivery Quale  Exercises - Supine Lower Trunk Rotation  - 2 x daily - 6 x weekly - 1 sets - 10 reps - 5 sec hold - Supine Single Knee to Chest Stretch  - 2 x daily - 6 x weekly - 1 sets - 2 reps - 30 sec hold - Supine Double Knee to Chest  - 2 x daily -  6 x weekly - 1 sets - 3 reps - 30 sec hold - Standing 'L' Stretch at [employer]  - 2 x daily - 6 x weekly - 1 sets - 10 reps - 10 sec hold - Standing Tandem Balance with Counter Support  - 2 x daily - 6 x weekly - 1 sets - 3 reps - 30 sec hold - Walking with Head Rotation  - 2 x daily - 6 x weekly - 3 reps - Walking with Head Nod  - 2 x daily - 6 x weekly - 3 reps - Feet Together Balance at [employer] Eyes Closed  - 2 x daily - 6 x weekly - 1 sets - 3 reps - 3- sec hold  ASSESSMENT:  CLINICAL IMPRESSION:She had good pain tolerance to session today and was not having as much pain upon arrival as well. PT recommending to continue with current plan.   OBJECTIVE IMPAIRMENTS: decreased activity tolerance, decreased balance, decreased coordination, decreased endurance, difficulty walking, decreased ROM, decreased strength, improper body mechanics, postural dysfunction, obesity, and pain.   ACTIVITY LIMITATIONS: carrying, lifting, bending, sitting, standing, squatting, sleeping, stairs, and transfers  PARTICIPATION LIMITATIONS: meal prep, cleaning, laundry, and community activity  PERSONAL FACTORS:  Past/current experiences, Time since onset of injury/illness/exacerbation, and 3+ comorbidities: Lt rTSA 2022, anxiety, breast cancer with lumpectomy and radioactive seed placement 2021 and recent port removal, chronic pain, concussion 2008, HTN, depression, spinal fusion 2012, alcohol abuse  are also affecting patient's functional outcome.   REHAB POTENTIAL: Good  CLINICAL DECISION MAKING: Evolving/moderate complexity  EVALUATION COMPLEXITY: Moderate    GOALS: Short term PT Goals Target date: 07/06/2023   Pt will be I and compliant with HEP. Baseline:  Goal status: ongoing 06/27/23 Pt will decrease pain by 25% overall Baseline: Goal status: ongoing 06/27/23  Long term PT goals Target date:08/31/2023   Pt will improve lumbar ROM to [facility] to improve functional mobility Baseline: Goal status: New Pt will improve  hip/knee strength to at least 4+/5 MMT to improve functional strength Baseline: Goal status: New Pt will improve FOTO to at least 45% functional to show improved function Baseline: Goal status: New Pt will reduce back pain by overall 50% overall with usual activity Baseline: Goal status: New Pt will improve BERG balance test to 45 points to show improved balance. Baseline: Goal status: New  PLAN: PT FREQUENCY: 1-2 times per week   PT DURATION: 6-12 weeks  PLANNED INTERVENTIONS (unless contraindicated): aquatic PT, Canalith repositioning, cryotherapy, Electrical stimulation, Iontophoresis with 4 mg/ml dexamethasome, Moist heat, traction, Ultrasound, gait training, Therapeutic exercise, balance training, neuromuscular re-education, patient/family education, prosthetic training, manual techniques, passive ROM, dry needling, taping, vasopnuematic device, vestibular, spinal manipulations, joint manipulations  PLAN FOR NEXT SESSION: flexion based stretching program, general strength and endurance, balance.    April Manson, PT,DPT 07/04/2023, 3:56 PM  Referring  diagnosis? M48.00 (ICD-10-CM) - Spinal stenosis, site unspecified Treatment diagnosis? (if different than referring diagnosis) M54.59 What was this (referring dx) caused by? []  Surgery [x]  Fall [x]  Ongoing issue [x]  Arthritis []  Other: ____________  Laterality: []  Rt []  Lt [x]  Both  Check all possible CPT codes:  *CHOOSE 10 OR LESS*    [x]  97110 (Therapeutic Exercise)  []  92507 (SLP Treatment)  [x]  97112 (Neuro Re-ed)   []  16109 (Swallowing Treatment)   [x]  97116 (Gait Training)   []  [id number] (Cognitive Training, 1st 15 minutes) [x]  97140 (Manual Therapy)   []  97130 (Cognitive Training, each add'l 15 minutes)  []  97164 (  Re-evaluation)                              []  Other, List CPT Code ____________  [x]  97530 (Therapeutic Activities)     []  40981 (Self Care)   []  All codes above (97110 - 97535)  [x]  97012 (Mechanical Traction)  [x]  97014 (E-stim Unattended)  []  97032 (E-stim manual)  []  97033 (Ionto)  []  97035 (Ultrasound) []  97750 (Physical Performance Training) []  [id number] (Aquatic Therapy) []  97016 (Vasopneumatic Device) []  [id number] (Paraffin) []  97034 (Contrast Bath) []  97597 (Wound Care 1st 20 sq cm) []  97598 (Wound Care each add'l 20 sq cm) []  97760 (Orthotic Fabrication, Fitting, Training Initial) []  [id number] (Prosthetic Management and Training Initial) []  [id number] (Orthotic or Prosthetic Training/ Modification Subsequent)

## 2023-07-06 ENCOUNTER — Ambulatory Visit: Payer: Medicare PPO | Admitting: Physical Therapy

## 2023-07-06 ENCOUNTER — Encounter: Payer: Self-pay | Admitting: Physical Therapy

## 2023-07-06 DIAGNOSIS — M5459 Other low back pain: Secondary | ICD-10-CM | POA: Diagnosis not present

## 2023-07-06 DIAGNOSIS — R262 Difficulty in walking, not elsewhere classified: Secondary | ICD-10-CM | POA: Diagnosis not present

## 2023-07-06 DIAGNOSIS — M6281 Muscle weakness (generalized): Secondary | ICD-10-CM

## 2023-07-06 NOTE — Therapy (Signed)
OUTPATIENT PHYSICAL THERAPY TREATMENT    Patient Name: [name] MRN: [id number] DOB:February 13, 1951, 72 y.o., female Today's Date: 07/06/2023  END OF SESSION:  PT End of Session - 07/06/23 1515     Visit Number 7    Number of Visits 12    Date for PT Re-Evaluation 08/03/23    Authorization Type [employer]    PT Start Time 1514    PT Stop Time 1553    PT Time Calculation (min) 39 min    Activity Tolerance Patient tolerated treatment well    Behavior During Therapy WFL for tasks assessed/performed              Past Medical History:  Diagnosis Date   Anxiety    Atrial fibrillation [facility])    sees Dr. Flora Lipps   Bilateral swelling of feet    Cancer (HCC)    breast   Chronic diarrhea    Chronic pain    Concussion 02/2007   ICU x 3 days   Depression    Hypertension    Hypothyroidism    Personal history of chemotherapy    Personal history of radiation therapy    Spinal stenosis    Thyroid disease ?1994   Vitamin D deficiency    Past Surgical History:  Procedure Laterality Date   BREAST BIOPSY Right 12/20/2019   x2   BREAST LUMPECTOMY Right 01/22/2020   BREAST LUMPECTOMY WITH RADIOACTIVE SEED AND SENTINEL LYMPH NODE BIOPSY Right 01/22/2020   Procedure: RIGHT BREAST LUMPECTOMY WITH RADIOACTIVE SEED X2 AND RIGHT SENTINEL LYMPH NODE MAPPING;  Surgeon: Harriette Bouillon, MD;  Location: [facility];  Service: General;  Laterality: Right;   BREAST SURGERY Right 03/1998   breast biopsy, benign   EYE SURGERY Right    PORT-A-CATH REMOVAL Right 04/30/2021   Procedure: REMOVAL PORT-A-CATH;  Surgeon: Harriette Bouillon, MD;  Location: [facility];  Service: General;  Laterality: Right;   PORTACATH PLACEMENT Right 01/22/2020   Procedure: INSERTION PORT-A-CATH WITH ULTRASOUND;  Surgeon: Harriette Bouillon, MD;  Location: [facility];  Service: General;  Laterality: Right;   PORTACATH PLACEMENT Right 02/28/2020   Procedure: PORT A CATH REVISION;   Surgeon: Harriette Bouillon, MD;  Location: [facility];  Service: General;  Laterality: Right;   REVERSE SHOULDER ARTHROPLASTY Left 02/02/2021   Procedure: LEFT REVERSE SHOULDER ARTHROPLASTY;  Surgeon: Cammy Copa, MD;  Location: [facility];  Service: Orthopedics;  Laterality: Left;   SHOULDER SURGERY Right    SPINAL FUSION  03/04/2011   with ORIF   Patient Active Problem List   Diagnosis Date Noted   Generalized anxiety disorder 11/21/2022   Alcohol abuse with alcohol-induced mood disorder (HCC) 11/21/2022   Alcohol abuse with alcohol-induced anxiety disorder (HCC) 11/21/2022   Pulmonary edema 11/18/2022   Acute respiratory failure with hypoxia and hypercapnia (HCC) 11/17/2022   Acute on chronic diastolic (congestive) heart failure (HCC) 11/17/2022   Arthritis of left shoulder region    S/P reverse total shoulder arthroplasty, 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   Vitamin D deficiency 01/15/2021   Vitamin B12 deficiency 01/15/2021   Primary insomnia 01/15/2021   Morbid obesity (HCC) 01/15/2021   Moderate recurrent major depression (HCC) 01/15/2021   Benzodiazepine dependence (HCC) 01/15/2021   Port-A-Cath in place 07/22/2020   Left leg cellulitis 04/17/2020   Cellulitis of left leg 04/17/2020   Hypokalemia 04/17/2020   Macrocytic anemia 04/17/2020   Anxiety 04/17/2020   Depression 04/17/2020   Chronic diarrhea 04/17/2020   Chemotherapy induced diarrhea  04/17/2020   Malignant neoplasm of upper-inner quadrant of right breast in female, estrogen receptor positive (HCC) 12/26/2019   Encephalopathy 02/02/2018   Hypothyroidism 02/02/2018   AKI (acute kidney injury) (HCC) 02/02/2018   Chronic back pain 02/02/2018   Alcohol abuse 02/02/2018   Weight gain 02/02/2018   Benign essential HTN 02/02/2018    PCP: Rebecka Apley, NP   REFERRING PROVIDER: Merryl Hacker, NP   REFERRING DIAG: M48.00 (ICD-10-CM) - Spinal stenosis, site unspecified  Rationale for Evaluation and Treatment:  Rehabilitation  THERAPY DIAG:  Other low back pain  Muscle weakness (generalized)  Difficulty in walking, not elsewhere classified  ONSET DATE: Chronic back pain for many years but worse over last 6 months.   SUBJECTIVE:                                                                                                                                                                                           SUBJECTIVE STATEMENT: She relays her back pain is still doing pretty good.    PERTINENT HISTORY:  Lt rTSA 2022, anxiety, breast cancer with lumpectomy and radioactive seed placement 2021 and recent port removal, chronic pain, concussion 2008, HTN, depression, spinal fusion 2012, alcohol abuse  PAIN:  Are you having pain? Yes: NPRS scale: 2/10 Pain location: left side low back Pain description: sharp, ache Aggravating factors: has not been able to pinpoint this, sometimes she just wakes up with pain Relieving factors: sitting  PRECAUTIONS: Fall  WEIGHT BEARING RESTRICTIONS: No  FALLS:  Has patient fallen in last 6 months? Yes. Number of falls 1, fell in hall and broke her wrist. Relays several near misses.    PLOF: Independent with basic ADLs  PATIENT GOALS: reduce pain and improve balance.   NEXT MD VISIT: nothing scheduled  OBJECTIVE:   DIAGNOSTIC FINDINGS:  Lumbar CT 04/08/22  IMPRESSION: 1. No acute osseous abnormality. 2. Adjacent segment disease at L3-4 with mild spinal stenosis and moderate left neural foraminal stenosis, progressed from 2019. 3. Solid L4-5 fusion. 4. 2 cm left adrenal mass with indeterminate noncontrast CT characteristics but likely benign given 2 year size stability.  PATIENT SURVEYS:  Eval: FOTO 38% functional intake, goal is 45%  SCREENING FOR RED FLAGS: Bowel or bladder incontinence: No  COGNITION: Overall cognitive status: Within functional limits for tasks assessed      POSTURE: slumped posture   LUMBAR ROM:   AROM eval   Flexion 50%  Extension 25%*  Right lateral flexion 50%*  Left lateral flexion 50%*  Right rotation 50%*  Left rotation 50%   (Blank rows = not tested) *denotes pain  LOWER EXTREMITY MMT:    MMT tested in sitting Right eval Left eval  Hip flexion 4 4  Hip extension    Hip abduction 4 4  Hip adduction    Hip internal rotation    Hip external rotation    Knee flexion 4+ 4+  Knee extension 4+ 4+  Ankle dorsiflexion 5 5  Ankle plantarflexion 5 5  Ankle inversion    Ankle eversion     (Blank rows = not tested)  LUMBAR SPECIAL TESTS:  Direction preference for lumbar flexion vs extensoin  FUNCTIONAL TESTS:      GAIT: eval Comments: unsteady gait, uses quad cane for assistance  TODAY'S TREATMENT:                                                                                                                              DATE:07/06/23 Nu step L6 X 5 min UE/LE Seated lumbar flexion stretch p ball roll outs 10 sec X 10 Seated pball core isometric shoulder extension 5 sec X 10 Seated pball core isometric hip flexion 5 sec X 10  Balance:  Feet together on airex pad 30 sec X 3  Feet apart on flat level ground eyes closed 20 sec X3  Tandem walk modified, in bars 3 round trips  Walking with head nods in bars 3 round trips  Walking with head rotations in bars 3 trips  Walking backwards in bars 3 round trips  Blaze pods SLS with one UE support 3 pods 30 sec X 3 and also using 3 pods and UE to turn off feet together 30 sec X 3   DATE:07/04/23 Nu step L6 X 10 min UE/LE Seated lumbar flexion stretch p ball roll outs 10 sec X 10 Seated pball core isometric shoulder extension 5 sec X 10 Seated pball core isometric hip flexion 5 sec X 10  Balance:  Feet together on airex pad 30 sec X 3  Feet apart on flat level ground eyes closed 20 sec X3  Tandem walk modified, in bars 3 round trips  Walking with head nods in bars 3 round trips  Walking with head rotations in bars 3  trips  Walking backwards in bars 3 round trips  Sidestepping without UE support in bars 3 trips  DATE:06/29/23 Nu step L6 X 8 min UE/LE Seated lumbar flexion stretch p ball roll outs 10 sec X 10 Seated pball core isometric shoulder extension 5 sec X 10 Seated pball core isometric hip flexion 5 sec X 10 Leg press machine DL 40# 9W11, then SL 91# X 15 each side Balance:  Feet together on airex pad 30 sec X 3  Feet apart on flat level ground eyes closed 20 sec X3  Tandem walk modified, in bars 3 round trips  Walking with head nods in bars 3 round trips  Walking with head rotations in bars 3 trips  Walking backwards in bars 3 round trips  Sidestepping without UE  support in bars 3 trips      PATIENT EDUCATION:  Education details: HEP, PT plan of care Person educated: Patient Education method: Explanation, Demonstration, Verbal cues, and Handouts Education comprehension: verbalized understanding and needs further education  HOME EXERCISE PROGRAM: Access Code: [email or web address] URL: [email or web address] Date: 06/08/2023 Prepared by: Ivery Quale  Exercises - Supine Lower Trunk Rotation  - 2 x daily - 6 x weekly - 1 sets - 10 reps - 5 sec hold - Supine Single Knee to Chest Stretch  - 2 x daily - 6 x weekly - 1 sets - 2 reps - 30 sec hold - Supine Double Knee to Chest  - 2 x daily - 6 x weekly - 1 sets - 3 reps - 30 sec hold - Standing 'L' Stretch at Counter  - 2 x daily - 6 x weekly - 1 sets - 10 reps - 10 sec hold - Standing Tandem Balance with Counter Support  - 2 x daily - 6 x weekly - 1 sets - 3 reps - 30 sec hold - Walking with Head Rotation  - 2 x daily - 6 x weekly - 3 reps - Walking with Head Nod  - 2 x daily - 6 x weekly - 3 reps - Feet Together Balance at [employer] Eyes Closed  - 2 x daily - 6 x weekly - 1 sets - 3 reps - 3- sec hold  ASSESSMENT:  CLINICAL IMPRESSION:her pain has overall been managed over last few weeks. We are focusing on more balance work and  did add in blaze pods today for extra balance challenges.   OBJECTIVE IMPAIRMENTS: decreased activity tolerance, decreased balance, decreased coordination, decreased endurance, difficulty walking, decreased ROM, decreased strength, improper body mechanics, postural dysfunction, obesity, and pain.   ACTIVITY LIMITATIONS: carrying, lifting, bending, sitting, standing, squatting, sleeping, stairs, and transfers  PARTICIPATION LIMITATIONS: meal prep, cleaning, laundry, and community activity  PERSONAL FACTORS: Past/current experiences, Time since onset of injury/illness/exacerbation, and 3+ comorbidities: Lt rTSA 2022, anxiety, breast cancer with lumpectomy and radioactive seed placement 2021 and recent port removal, chronic pain, concussion 2008, HTN, depression, spinal fusion 2012, alcohol abuse  are also affecting patient's functional outcome.   REHAB POTENTIAL: Good  CLINICAL DECISION MAKING: Evolving/moderate complexity  EVALUATION COMPLEXITY: Moderate    GOALS: Short term PT Goals Target date: 07/06/2023   Pt will be I and compliant with HEP. Baseline:  Goal status: ongoing 06/27/23 Pt will decrease pain by 25% overall Baseline: Goal status: ongoing 06/27/23  Long term PT goals Target date:08/31/2023   Pt will improve lumbar ROM to [facility] to improve functional mobility Baseline: Goal status: New Pt will improve  hip/knee strength to at least 4+/5 MMT to improve functional strength Baseline: Goal status: New Pt will improve FOTO to at least 45% functional to show improved function Baseline: Goal status: New Pt will reduce back pain by overall 50% overall with usual activity Baseline: Goal status: New Pt will improve BERG balance test to 45 points to show improved balance. Baseline: Goal status: New  PLAN: PT FREQUENCY: 1-2 times per week   PT DURATION: 6-12 weeks  PLANNED INTERVENTIONS (unless contraindicated): aquatic PT, Canalith repositioning, cryotherapy, Electrical  stimulation, Iontophoresis with 4 mg/ml dexamethasome, Moist heat, traction, Ultrasound, gait training, Therapeutic exercise, balance training, neuromuscular re-education, patient/family education, prosthetic training, manual techniques, passive ROM, dry needling, taping, vasopnuematic device, vestibular, spinal manipulations, joint manipulations  PLAN FOR NEXT SESSION: check short term goals. flexion based stretching program, general strength  and endurance, balance.    April Manson, PT,DPT 07/06/2023, 3:15 PM  Referring diagnosis? M48.00 (ICD-10-CM) - Spinal stenosis, site unspecified Treatment diagnosis? (if different than referring diagnosis) M54.59 What was this (referring dx) caused by? []  Surgery [x]  Fall [x]  Ongoing issue [x]  Arthritis []  Other: ____________  Laterality: []  Rt []  Lt [x]  Both  Check all possible CPT codes:  *CHOOSE 10 OR LESS*    [x]  97110 (Therapeutic Exercise)  []  92507 (SLP Treatment)  [x]  97112 (Neuro Re-ed)   []  92526 (Swallowing Treatment)   [x]  97116 (Gait Training)   []  [id number] (Cognitive Training, 1st 15 minutes) [x]  25366 (Manual Therapy)   []  97130 (Cognitive Training, each add'l 15 minutes)  []  97164 (Re-evaluation)                              []  Other, List CPT Code ____________  [x]  97530 (Therapeutic Activities)     []  97535 (Self Care)   []  All codes above (97110 - 97535)  [x]  97012 (Mechanical Traction)  [x]  97014 (E-stim Unattended)  []  97032 (E-stim manual)  []  97033 (Ionto)  []  97035 (Ultrasound) []  97750 (Physical Performance Training) [x]  [id number] (Aquatic Therapy) []  97016 (Vasopneumatic Device) []  [id number] (Paraffin) []  97034 (Contrast Bath) []  97597 (Wound Care 1st 20 sq cm) []  97598 (Wound Care each add'l 20 sq cm) []  97760 (Orthotic Fabrication, Fitting, Training Initial) []  [id number] (Prosthetic Management and Training Initial) []  [id number] (Orthotic or Prosthetic Training/ Modification Subsequent)

## 2023-07-09 NOTE — Progress Notes (Signed)
Cardiology Office Note:   Date:  07/12/2023  NAME:  [name]    MRN: [id number] DOB:  1951/06/10   PCP:  Rebecka Apley, NP  Cardiologist:  Reatha Harps, MD  Electrophysiologist:  None   Referring MD: Rebecka Apley, NP   Chief Complaint  Patient presents with   Follow-up         History of Present Illness:   [name] is a 72 y.o. female with a hx of persistent Afib, HTN, etoh abuse who presents for follow-up.   EKG shows she is back in sinus rhythm.  She has gone through alcohol treatment.  I do wonder if alcohol triggered her atrial fibrillation in the first place.  She reports no chest pains or trouble breathing.  Can have occasional flutters.  Still having bruising on Eliquis.  We did discuss the Watchman procedure.  She would like to hold on this for now.  She seems to be doing much better.  No signs of volume overload.  Blood pressure is well-controlled.  No symptoms of angina reported today.  She is a bit limited due to lumbar stenosis.  Working with physical therapy.   Problem List 1. Stage IA Breast Cancer -ER+/PR+/HER2+ -lumpectomy 01/22/2020 -radiation 05/2020-06/2020 -s/p adjuvant chemo -completed trastuzumab  2. HTN 3. Obesity -BMI 36 4. Former Smoker -20 years  5. Persistent atrial fibrillation  -Dx 01/27/2021 -CHADVASC=3 (age, sex, HTN) -NSR 07/12/2023 6. LLE edema -negative DVT study -likely 2/2 trauma  7. ETOH abuse  8. HFpEF 9. Lumbar stenosis   Past Medical History: Past Medical History:  Diagnosis Date   Anxiety    Atrial fibrillation [facility])    sees Dr. Flora Lipps   Bilateral swelling of feet    Cancer [facility])    breast   Chronic diarrhea    Chronic pain    Concussion 02/2007   ICU x 3 days   Depression    Hypertension    Hypothyroidism    Personal history of chemotherapy    Personal history of radiation therapy    Spinal stenosis    Thyroid disease ?1994   Vitamin D deficiency     Past Surgical History: Past  Surgical History:  Procedure Laterality Date   BREAST BIOPSY Right 12/20/2019   x2   BREAST LUMPECTOMY Right 01/22/2020   BREAST LUMPECTOMY WITH RADIOACTIVE SEED AND SENTINEL LYMPH NODE BIOPSY Right 01/22/2020   Procedure: RIGHT BREAST LUMPECTOMY WITH RADIOACTIVE SEED X2 AND RIGHT SENTINEL LYMPH NODE MAPPING;  Surgeon: Harriette Bouillon, MD;  Location: [facility];  Service: General;  Laterality: Right;   BREAST SURGERY Right 03/1998   breast biopsy, benign   EYE SURGERY Right    PORT-A-CATH REMOVAL Right 04/30/2021   Procedure: REMOVAL PORT-A-CATH;  Surgeon: Harriette Bouillon, MD;  Location: [facility];  Service: General;  Laterality: Right;   PORTACATH PLACEMENT Right 01/22/2020   Procedure: INSERTION PORT-A-CATH WITH ULTRASOUND;  Surgeon: Harriette Bouillon, MD;  Location: [facility];  Service: General;  Laterality: Right;   PORTACATH PLACEMENT Right 02/28/2020   Procedure: PORT A CATH REVISION;  Surgeon: Harriette Bouillon, MD;  Location: [facility] OR;  Service: General;  Laterality: Right;   REVERSE SHOULDER ARTHROPLASTY Left 02/02/2021   Procedure: LEFT REVERSE SHOULDER ARTHROPLASTY;  Surgeon: Cammy Copa, MD;  Location: [facility] OR;  Service: Orthopedics;  Laterality: Left;   SHOULDER SURGERY Right    SPINAL FUSION  03/04/2011   with ORIF  Current Medications: Current Meds  Medication Sig   acetaminophen (TYLENOL) 500 MG tablet Take 1,000 mg by mouth every 6 (six) hours as needed for mild pain.   anastrozole (ARIMIDEX) 1 MG tablet TAKE ONE TABLET BY MOUTH DAILY. (Patient taking differently: Take 1 mg by mouth daily.)   apixaban (ELIQUIS) 5 MG TABS tablet Take 1 tablet (5 mg total) by mouth 2 (two) times daily.   BELBUCA 300 MCG FILM Take by mouth 2 (two) times daily.   carvedilol (COREG) 12.5 MG tablet Take 1 tablet (12.5 mg total) by mouth 2 (two) times daily.   dicyclomine (BENTYL) 10 MG capsule Take 10 mg by mouth 4 (four) times daily -  before  meals and at bedtime.   DULoxetine (CYMBALTA) 60 MG capsule Take 60 mg by mouth 2 (two) times daily.   furosemide (LASIX) 20 MG tablet Take 2 tablets (40 mg total) by mouth daily.   levothyroxine (SYNTHROID) 175 MCG tablet Take 1 tablet (175 mcg total) by mouth daily.   losartan (COZAAR) 50 MG tablet Take 50 mg by mouth daily.   potassium chloride (KLOR-CON) 10 MEQ tablet Take 10 mEq by mouth daily. May take 1 additional tablet ( 10 MeQ) if taking lasix.   sertraline (ZOLOFT) 50 MG tablet Take 1 tablet (50 mg total) by mouth daily.   VIBERZI 100 MG TABS Take 1 tablet by mouth 2 (two) times daily.   Vitamin D, Ergocalciferol, (DRISDOL) 1.25 MG (50000 UNIT) CAPS capsule Take 1 capsule (50,000 Units total) by mouth every 7 (seven) days.   [DISCONTINUED] spironolactone (ALDACTONE) 25 MG tablet Take 1 tablet (25 mg total) by mouth daily.     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  Tobacco Use   Smoking status: Former    Current packs/day: 0.00    Types: Cigarettes    Start date: 1960    Quit date: 1980    Years since quitting: 44.5   Smokeless tobacco: Never  Vaping Use   Vaping status: Never Used  Substance and Sexual Activity   Alcohol use: Yes    Alcohol/week: 5.0 standard drinks of alcohol    Types: 5 Standard drinks or equivalent per week    Comment: wine   Drug use: No   Sexual activity: Not Currently    Partners: Male    Birth control/protection: Post-menopausal  Other Topics Concern   Not on file  Social History Narrative   Not on file   Social Determinants of Health   Financial Resource Strain: Low Risk  (11/17/2022)   Received from [facility], [facility]   Overall Financial Resource Strain  (CARDIA)    Difficulty of Paying Living Expenses: Not hard at all  Food Insecurity: No Food Insecurity (11/19/2022)   Hunger Vital Sign    Worried About Running Out of Food in the Last Year: Never true    Ran Out of Food in the Last Year: Never true  Transportation Needs: No Transportation Needs (11/19/2022)   PRAPARE - Administrator, Civil Service (Medical): No    Lack of Transportation (Non-Medical): No  Physical Activity: Unknown (11/17/2022)   Received from [facility], [facility]  Exercise Vital Sign    Days of Exercise per Week: 0 days    Minutes of Exercise per Session: Not on file  Stress: Stress Concern Present (11/17/2022)   Received from [facility], [facility] Occup[facility] - Occupational Stress Questionnaire    Feeling of Stress : Very much  Social Connections: Socially Integrated (11/17/2022)   Received from [facility], [facility]   Social Network    How would you rate your social network (family, work, friends)?: Good participation with social networks  Recent Concern: Social Connections - Somewhat Isolated (10/20/2022)   Received from [facility]   Social Network    How would you rate your social network (family, work, friends)?: Restricted participation with some degree of social isolation     Family History: The patient's family history includes Alcoholism in her father; Anxiety disorder in her mother; Dementia in her mother; Depression in her mother; Diabetes in her father and sister; Hypertension in her sister; Stroke in her father; Thyroid disease in her mother. There is no history of Colon cancer, Colon polyps, Stomach cancer, or Esophageal cancer.  ROS:   All other ROS reviewed and negative. Pertinent positives noted in the HPI.     EKGs/Labs/Other Studies Reviewed:   The following studies were personally reviewed by me today:  EKG:  EKG is ordered today.    EKG Interpretation Date/Time:  Tuesday  July 12 2023 10:01:25 EDT Ventricular Rate:  79 PR Interval:  174 QRS Duration:  72 QT Interval:  392 QTC Calculation: 449 R Axis:   -18  Text Interpretation: Normal sinus rhythm Normal ECG Confirmed by Lennie Odor [phone]) on 07/12/2023 10:05:09 AM   TTE 11/18/2022  1. Left ventricular ejection fraction, by estimation, is 60 to 65%. The  left ventricle has normal function. The left ventricle has no regional  wall motion abnormalities. There is mild left ventricular hypertrophy of  the septal segment. Left ventricular  diastolic parameters are indeterminate.   2. Right ventricular systolic function is normal. The right ventricular  size is normal. There is mildly elevated pulmonary artery systolic  pressure.   3. No evidence of mitral valve regurgitation.   4. The aortic valve is tricuspid. Aortic valve regurgitation is not  visualized.   Recent Labs: 07/21/2022: TSH 16.500 11/17/2022: ALT 14; B Natriuretic Peptide 104.6 11/22/2022: Magnesium 1.9 11/25/2022: BUN 23; Creatinine, Ser 0.75; Hemoglobin 12.3; Platelets 268; Potassium 3.5; Sodium 144   Recent Lipid Panel    Component Value Date/Time   CHOL 271 (H) 07/21/2022 0910   TRIG 165 (H) 07/21/2022 0910   HDL 103 07/21/2022 0910   LDLCALC 140 (H) 07/21/2022 0910    Physical Exam:   VS:  BP 124/84   Pulse 78   Ht 5\' 7"  (1.702 m)   Wt 215 lb (97.5 kg)   LMP  (LMP Unknown)   SpO2 96%   BMI 33.67 kg/m    Wt Readings from Last 3 Encounters:  07/12/23 215 lb (97.5 kg)  11/27/22 251 lb 1.7 oz (113.9 kg)  09/21/22 250 lb (113.4 kg)    General: Well nourished, well developed, in no acute distress Head: Atraumatic, normal size  Eyes: PEERLA, EOMI  Neck: Supple, no JVD Endocrine: No thryomegaly Cardiac: Normal S1, S2; RRR; no murmurs, rubs, or gallops Lungs: Clear to auscultation bilaterally, no wheezing, rhonchi or rales  Abd: Soft, nontender, no hepatomegaly  Ext: No edema, pulses 2+ Musculoskeletal: No deformities,  BUE and BLE  strength normal and equal Skin: Warm and dry, no rashes   Neuro: Alert and oriented to person, place, time, and situation, CNII-XII grossly intact, no focal deficits  Psych: Normal mood and affect   ASSESSMENT:   [name] is a 72 y.o. female who presents for the following: 1. Persistent atrial fibrillation (HCC)   2. Acquired thrombophilia (HCC)   3. Chronic diastolic heart failure (HCC)     PLAN:   1. Persistent atrial fibrillation (HCC) 2. Acquired thrombophilia (HCC) -Back in sinus rhythm today.  Has undergone treatment for alcohol abuse.  I suspect alcohol could have been a trigger for her A-fib.  She is no longer drinking and maintaining sinus rhythm.  She will continue Coreg for now.  On Eliquis 5 mg twice daily.  She does have some bruising but no significant bleeding.  We did discuss the Watchman procedure as a safe way to come off anticoagulation.  We also discussed holding anticoagulation and excepting her stroke risk.  For now she accepts the bruising and will continue with Eliquis for now.  If she changes her mind about watchman we can reevaluate.  3. Chronic diastolic heart failure (HCC) -Euvolemic on examination.  On Lasix 40 mg daily.  She also takes potassium.  No longer taking Aldactone.  She can hold on this.  Overall doing better from a heart standpoint.  She is working with physical therapy for lumbar stenosis.  This does limit her.  Disposition: Return in about 1 year (around 07/11/2024).  Medication Adjustments/Labs and Tests Ordered: Current medicines are reviewed at length with the patient today.  Concerns regarding medicines are outlined above.  Orders Placed This Encounter  Procedures   EKG 12-Lead   No orders of the defined types were placed in this encounter.  Patient Instructions  Medication Instructions:  Your physician recommends that you continue on your current medications as directed. Please refer to the Current Medication list  given to you today.  *If you need a refill on your cardiac medications before your next appointment, please call your pharmacy*   Follow-Up: At [facility], you and your health needs are our priority.  As part of our continuing mission to provide you with exceptional heart care, we have created designated Provider Care Teams.  These Care Teams include your primary Cardiologist (physician) and Advanced Practice Providers (APPs -  Physician Assistants and Nurse Practitioners) who all work together to provide you with the care you need, when you need it.  We recommend signing up for the patient portal called "MyChart".  Sign up information is provided on this After Visit Summary.  MyChart is used to connect with patients for Virtual Visits (Telemedicine).  Patients are able to view lab/test results, encounter notes, upcoming appointments, etc.  Non-urgent messages can be sent to your provider as well.   To learn more about what you can do with MyChart, go to [email or web address].    Your next appointment:   12 month(s)  Provider:   Reatha Harps, MD      Time Spent with Patient: I have spent a total of 25 minutes with patient reviewing hospital notes, telemetry, EKGs, labs and examining the patient as well as establishing an assessment and plan that was discussed with the patient.  > 50% of time was spent in direct patient care.  Signed, Lenna Gilford. Flora Lipps, MD, [facility]  [facility]  [address] [place], [place] [address] [phone]  07/12/2023 10:21  AM

## 2023-07-12 ENCOUNTER — Ambulatory Visit: Payer: Medicare PPO | Attending: Cardiovascular Disease | Admitting: Cardiovascular Disease

## 2023-07-12 ENCOUNTER — Encounter: Payer: Self-pay | Admitting: Cardiovascular Disease

## 2023-07-12 VITALS — BP 124/84 | HR 78 | Ht 67.0 in | Wt 215.0 lb

## 2023-07-12 DIAGNOSIS — I4819 Other persistent atrial fibrillation: Secondary | ICD-10-CM

## 2023-07-12 DIAGNOSIS — D6869 Other thrombophilia: Secondary | ICD-10-CM

## 2023-07-12 DIAGNOSIS — I5032 Chronic diastolic (congestive) heart failure: Secondary | ICD-10-CM | POA: Diagnosis not present

## 2023-07-12 NOTE — Patient Instructions (Signed)
Medication Instructions:  Your physician recommends that you continue on your current medications as directed. Please refer to the Current Medication list given to you today.  *If you need a refill on your cardiac medications before your next appointment, please call your pharmacy*   Follow-Up: At [facility], you and your health needs are our priority.  As part of our continuing mission to provide you with exceptional heart care, we have created designated Provider Care Teams.  These Care Teams include your primary Cardiologist (physician) and Advanced Practice Providers (APPs -  Physician Assistants and Nurse Practitioners) who all work together to provide you with the care you need, when you need it.  We recommend signing up for the patient portal called "MyChart".  Sign up information is provided on this After Visit Summary.  MyChart is used to connect with patients for Virtual Visits (Telemedicine).  Patients are able to view lab/test results, encounter notes, upcoming appointments, etc.  Non-urgent messages can be sent to your provider as well.   To learn more about what you can do with MyChart, go to [email or web address].    Your next appointment:   12 month(s)  Provider:   Reatha Harps, MD

## 2023-07-14 ENCOUNTER — Ambulatory Visit: Payer: Medicare PPO | Admitting: Cardiovascular Disease

## 2023-07-19 ENCOUNTER — Encounter: Payer: Medicare PPO | Admitting: Physical Therapy

## 2023-07-20 ENCOUNTER — Other Ambulatory Visit: Payer: Self-pay | Admitting: Hematology and Oncology

## 2023-07-21 ENCOUNTER — Ambulatory Visit (INDEPENDENT_AMBULATORY_CARE_PROVIDER_SITE_OTHER): Payer: Medicare PPO | Admitting: Physical Therapy

## 2023-07-21 DIAGNOSIS — R262 Difficulty in walking, not elsewhere classified: Secondary | ICD-10-CM | POA: Diagnosis not present

## 2023-07-21 DIAGNOSIS — M6281 Muscle weakness (generalized): Secondary | ICD-10-CM | POA: Diagnosis not present

## 2023-07-21 DIAGNOSIS — M5459 Other low back pain: Secondary | ICD-10-CM

## 2023-07-21 NOTE — Therapy (Signed)
OUTPATIENT PHYSICAL THERAPY TREATMENT    Patient Name: [name] MRN: [id number] DOB:01-24-1951, 72 y.o., female Today's Date: 07/21/2023  END OF SESSION:  PT End of Session - 07/21/23 1335     Visit Number 8    Number of Visits 12    Date for PT Re-Evaluation 08/03/23    Authorization Type [employer]    PT Start Time 1304    PT Stop Time 1345    PT Time Calculation (min) 41 min    Activity Tolerance Patient tolerated treatment well    Behavior During Therapy WFL for tasks assessed/performed              Past Medical History:  Diagnosis Date   Anxiety    Atrial fibrillation [facility])    sees Dr. Flora Lipps   Bilateral swelling of feet    Cancer (HCC)    breast   Chronic diarrhea    Chronic pain    Concussion 02/2007   ICU x 3 days   Depression    Hypertension    Hypothyroidism    Personal history of chemotherapy    Personal history of radiation therapy    Spinal stenosis    Thyroid disease ?1994   Vitamin D deficiency    Past Surgical History:  Procedure Laterality Date   BREAST BIOPSY Right 12/20/2019   x2   BREAST LUMPECTOMY Right 01/22/2020   BREAST LUMPECTOMY WITH RADIOACTIVE SEED AND SENTINEL LYMPH NODE BIOPSY Right 01/22/2020   Procedure: RIGHT BREAST LUMPECTOMY WITH RADIOACTIVE SEED X2 AND RIGHT SENTINEL LYMPH NODE MAPPING;  Surgeon: Harriette Bouillon, MD;  Location: [facility];  Service: General;  Laterality: Right;   BREAST SURGERY Right 03/1998   breast biopsy, benign   EYE SURGERY Right    PORT-A-CATH REMOVAL Right 04/30/2021   Procedure: REMOVAL PORT-A-CATH;  Surgeon: Harriette Bouillon, MD;  Location: [facility];  Service: General;  Laterality: Right;   PORTACATH PLACEMENT Right 01/22/2020   Procedure: INSERTION PORT-A-CATH WITH ULTRASOUND;  Surgeon: Harriette Bouillon, MD;  Location: [facility];  Service: General;  Laterality: Right;   PORTACATH PLACEMENT Right 02/28/2020   Procedure: PORT A CATH REVISION;   Surgeon: Harriette Bouillon, MD;  Location: [facility] OR;  Service: General;  Laterality: Right;   REVERSE SHOULDER ARTHROPLASTY Left 02/02/2021   Procedure: LEFT REVERSE SHOULDER ARTHROPLASTY;  Surgeon: Cammy Copa, MD;  Location: [facility] OR;  Service: Orthopedics;  Laterality: Left;   SHOULDER SURGERY Right    SPINAL FUSION  03/04/2011   with ORIF   Patient Active Problem List   Diagnosis Date Noted   Generalized anxiety disorder 11/21/2022   Alcohol abuse with alcohol-induced mood disorder (HCC) 11/21/2022   Alcohol abuse with alcohol-induced anxiety disorder (HCC) 11/21/2022   Pulmonary edema 11/18/2022   Acute respiratory failure with hypoxia and hypercapnia (HCC) 11/17/2022   Acute on chronic diastolic (congestive) heart failure (HCC) 11/17/2022   Arthritis of left shoulder region    S/P reverse total shoulder arthroplasty, 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   Vitamin D deficiency 01/15/2021   Vitamin B12 deficiency 01/15/2021   Primary insomnia 01/15/2021   Morbid obesity (HCC) 01/15/2021   Moderate recurrent major depression (HCC) 01/15/2021   Benzodiazepine dependence (HCC) 01/15/2021   Port-A-Cath in place 07/22/2020   Left leg cellulitis 04/17/2020   Cellulitis of left leg 04/17/2020   Hypokalemia 04/17/2020   Macrocytic anemia 04/17/2020   Anxiety 04/17/2020   Depression 04/17/2020   Chronic diarrhea 04/17/2020   Chemotherapy induced diarrhea  04/17/2020   Malignant neoplasm of upper-inner quadrant of right breast in female, estrogen receptor positive (HCC) 12/26/2019   Encephalopathy 02/02/2018   Hypothyroidism 02/02/2018   AKI (acute kidney injury) (HCC) 02/02/2018   Chronic back pain 02/02/2018   Alcohol abuse 02/02/2018   Weight gain 02/02/2018   Benign essential HTN 02/02/2018    PCP: Rebecka Apley, NP   REFERRING PROVIDER: Merryl Hacker, NP   REFERRING DIAG: M48.00 (ICD-10-CM) - Spinal stenosis, site unspecified  Rationale for Evaluation and Treatment:  Rehabilitation  THERAPY DIAG:  Other low back pain  Muscle weakness (generalized)  Difficulty in walking, not elsewhere classified  ONSET DATE: Chronic back pain for many years but worse over last 6 months.   SUBJECTIVE:                                                                                                                                                                                           SUBJECTIVE STATEMENT: She relays her back pain is okay, has been sick lately with stomach issues and feels unsteady today.   PERTINENT HISTORY:  Lt rTSA 2022, anxiety, breast cancer with lumpectomy and radioactive seed placement 2021 and recent port removal, chronic pain, concussion 2008, HTN, depression, spinal fusion 2012, alcohol abuse  PAIN:  Are you having pain? Yes: NPRS scale: 3/10 Pain location: left side low back Pain description: sharp, ache Aggravating factors: has not been able to pinpoint this, sometimes she just wakes up with pain Relieving factors: sitting  PRECAUTIONS: Fall  WEIGHT BEARING RESTRICTIONS: No  FALLS:  Has patient fallen in last 6 months? Yes. Number of falls 1, fell in hall and broke her wrist. Relays several near misses.    PLOF: Independent with basic ADLs  PATIENT GOALS: reduce pain and improve balance.   NEXT MD VISIT: nothing scheduled  OBJECTIVE:   DIAGNOSTIC FINDINGS:  Lumbar CT 04/08/22  IMPRESSION: 1. No acute osseous abnormality. 2. Adjacent segment disease at L3-4 with mild spinal stenosis and moderate left neural foraminal stenosis, progressed from 2019. 3. Solid L4-5 fusion. 4. 2 cm left adrenal mass with indeterminate noncontrast CT characteristics but likely benign given 2 year size stability.  PATIENT SURVEYS:  Eval: FOTO 38% functional intake, goal is 45%  SCREENING FOR RED FLAGS: Bowel or bladder incontinence: No  COGNITION: Overall cognitive status: Within functional limits for tasks assessed      POSTURE:  slumped posture   LUMBAR ROM:   AROM eval  Flexion 50%  Extension 25%*  Right lateral flexion 50%*  Left lateral flexion 50%*  Right rotation 50%*  Left rotation 50%   (Blank rows =  not tested) *denotes pain   LOWER EXTREMITY MMT:    MMT tested in sitting Right eval Left eval  Hip flexion 4 4  Hip extension    Hip abduction 4 4  Hip adduction    Hip internal rotation    Hip external rotation    Knee flexion 4+ 4+  Knee extension 4+ 4+  Ankle dorsiflexion 5 5  Ankle plantarflexion 5 5  Ankle inversion    Ankle eversion     (Blank rows = not tested)  LUMBAR SPECIAL TESTS:  Direction preference for lumbar flexion vs extensoin  FUNCTIONAL TESTS:      GAIT: eval Comments: unsteady gait, uses quad cane for assistance  TODAY'S TREATMENT:                                                                                                                              DATE:07/21/23 Nu step L6X 15 min UE/LE Seated lumbar flexion stretch p ball roll outs 10 sec X 10 Seated pball core isometric shoulder extension 5 sec X 10 Seated pball core isometric hip flexion 5 sec X 10 Sit to stand without UE support from 23.5 inch X10 Seated LAQ 5# X 20 bilat  Balance:  Walking with head nods 20 feet with CGA  Walking with head rotations 20 feet with CGA  Walking backwards 20 feet with CGA  Walking through cones 15 feet X 4 with CGA  DATE:07/06/23 Nu step L6 X 5 min UE/LE Seated lumbar flexion stretch p ball roll outs 10 sec X 10 Seated pball core isometric shoulder extension 5 sec X 10 Seated pball core isometric hip flexion 5 sec X 10  Balance:  Feet together on airex pad 30 sec X 3  Feet apart on flat level ground eyes closed 20 sec X3  Tandem walk modified, in bars 3 round trips  Walking with head nods in bars 3 round trips  Walking with head rotations in bars 3 trips  Walking backwards in bars 3 round trips  Blaze pods SLS with one UE support 3 pods 30 sec X 3 and also  using 3 pods and UE to turn off feet together 30 sec X 3  PATIENT EDUCATION:  Education details: HEP, PT plan of care Person educated: Patient Education method: Explanation, Demonstration, Verbal cues, and Handouts Education comprehension: verbalized understanding and needs further education  HOME EXERCISE PROGRAM: Access Code: [email or web address] URL: [email or web address] Date: 06/08/2023 Prepared by: Ivery Quale  Exercises - Supine Lower Trunk Rotation  - 2 x daily - 6 x weekly - 1 sets - 10 reps - 5 sec hold - Supine Single Knee to Chest Stretch  - 2 x daily - 6 x weekly - 1 sets - 2 reps - 30 sec hold - Supine Double Knee to Chest  - 2 x daily - 6 x weekly - 1 sets - 3 reps - 30 sec hold - Standing 'L' Stretch at [employer]  -  2 x daily - 6 x weekly - 1 sets - 10 reps - 10 sec hold - Standing Tandem Balance with Counter Support  - 2 x daily - 6 x weekly - 1 sets - 3 reps - 30 sec hold - Walking with Head Rotation  - 2 x daily - 6 x weekly - 3 reps - Walking with Head Nod  - 2 x daily - 6 x weekly - 3 reps - Feet Together Balance at [employer] Eyes Closed  - 2 x daily - 6 x weekly - 1 sets - 3 reps - 3- sec hold  ASSESSMENT:  CLINICAL IMPRESSION:her pain continues to do fairly well so we are working toward more strengthening and balance work now to improve her function. She has met short term goals but not yet long term goals.   OBJECTIVE IMPAIRMENTS: decreased activity tolerance, decreased balance, decreased coordination, decreased endurance, difficulty walking, decreased ROM, decreased strength, improper body mechanics, postural dysfunction, obesity, and pain.   ACTIVITY LIMITATIONS: carrying, lifting, bending, sitting, standing, squatting, sleeping, stairs, and transfers  PARTICIPATION LIMITATIONS: meal prep, cleaning, laundry, and community activity  PERSONAL FACTORS: Past/current experiences, Time since onset of injury/illness/exacerbation, and 3+ comorbidities: Lt rTSA  2022, anxiety, breast cancer with lumpectomy and radioactive seed placement 2021 and recent port removal, chronic pain, concussion 2008, HTN, depression, spinal fusion 2012, alcohol abuse  are also affecting patient's functional outcome.   REHAB POTENTIAL: Good  CLINICAL DECISION MAKING: Evolving/moderate complexity  EVALUATION COMPLEXITY: Moderate    GOALS: Short term PT Goals Target date: 07/06/2023   Pt will be I and compliant with HEP. Baseline:  Goal status: MET 07/21/23 Pt will decrease pain by 25% overall Baseline: Goal status: MET 07/21/23  Long term PT goals Target date:08/31/2023   Pt will improve lumbar ROM to [facility] to improve functional mobility Baseline: Goal status: ongoing 07/21/23 Pt will improve  hip/knee strength to at least 4+/5 MMT to improve functional strength Baseline: Goal status: ongoing 07/21/23 Pt will improve FOTO to at least 45% functional to show improved function Baseline: Goal status: ongoing 07/21/23 Pt will reduce back pain by overall 50% overall with usual activity Baseline: Goal status: ongoing 07/21/23 Pt will improve BERG balance test to 45 points to show improved balance. Baseline: Goal status: ongoing 07/21/23  PLAN: PT FREQUENCY: 1-2 times per week   PT DURATION: 6-12 weeks  PLANNED INTERVENTIONS (unless contraindicated): aquatic PT, Canalith repositioning, cryotherapy, Electrical stimulation, Iontophoresis with 4 mg/ml dexamethasome, Moist heat, traction, Ultrasound, gait training, Therapeutic exercise, balance training, neuromuscular re-education, patient/family education, prosthetic training, manual techniques, passive ROM, dry needling, taping, vasopnuematic device, vestibular, spinal manipulations, joint manipulations  PLAN FOR NEXT SESSION: check short term goals. flexion based stretching program, general strength and endurance, balance.    April Manson, PT,DPT 07/21/2023, 1:37 PM  Referring diagnosis? M48.00 (ICD-10-CM) - Spinal  stenosis, site unspecified Treatment diagnosis? (if different than referring diagnosis) M54.59 What was this (referring dx) caused by? []  Surgery [x]  Fall [x]  Ongoing issue [x]  Arthritis []  Other: ____________  Laterality: []  Rt []  Lt [x]  Both  Check all possible CPT codes:  *CHOOSE 10 OR LESS*    [x]  97110 (Therapeutic Exercise)  []  92507 (SLP Treatment)  [x]  97112 (Neuro Re-ed)   []  92526 (Swallowing Treatment)   [x]  97116 (Gait Training)   []  [id number] (Cognitive Training, 1st 15 minutes) [x]  97140 (Manual Therapy)   []  97130 (Cognitive Training, each add'l 15 minutes)  []  97164 (Re-evaluation)                              []   Other, List CPT Code ____________  [x]  97530 (Therapeutic Activities)     []  97535 (Self Care)   []  All codes above (97110 - 97535)  [x]  03474 (Mechanical Traction)  [x]  97014 (E-stim Unattended)  []  97032 (E-stim manual)  []  97033 (Ionto)  []  97035 (Ultrasound) []  97750 (Physical Performance Training) [x]  [id number] (Aquatic Therapy) []  97016 (Vasopneumatic Device) []  [id number] (Paraffin) []  97034 (Contrast Bath) []  97597 (Wound Care 1st 20 sq cm) []  97598 (Wound Care each add'l 20 sq cm) []  97760 (Orthotic Fabrication, Fitting, Training Initial) []  [id number] (Prosthetic Management and Training Initial) []  [id number] (Orthotic or Prosthetic Training/ Modification Subsequent)

## 2023-07-26 ENCOUNTER — Encounter: Payer: Self-pay | Admitting: Physical Therapy

## 2023-07-26 ENCOUNTER — Ambulatory Visit: Payer: Medicare PPO | Admitting: Physical Therapy

## 2023-07-26 DIAGNOSIS — M6281 Muscle weakness (generalized): Secondary | ICD-10-CM | POA: Diagnosis not present

## 2023-07-26 DIAGNOSIS — Z9181 History of falling: Secondary | ICD-10-CM

## 2023-07-26 DIAGNOSIS — R262 Difficulty in walking, not elsewhere classified: Secondary | ICD-10-CM

## 2023-07-26 DIAGNOSIS — M25561 Pain in right knee: Secondary | ICD-10-CM | POA: Diagnosis not present

## 2023-07-26 DIAGNOSIS — M5459 Other low back pain: Secondary | ICD-10-CM | POA: Diagnosis not present

## 2023-07-26 NOTE — Therapy (Signed)
OUTPATIENT PHYSICAL THERAPY TREATMENT    Patient Name: [name] MRN: [id number] DOB:[birth year], 72 y.o., female Today's Date: 07/26/2023  END OF SESSION:  PT End of Session - 07/26/23 1315     Visit Number 9    Number of Visits 12    Date for PT Re-Evaluation 08/03/23    Authorization Type [employer]    PT Start Time 1302    PT Stop Time 1343    PT Time Calculation (min) 41 min    Activity Tolerance Patient tolerated treatment well    Behavior During Therapy WFL for tasks assessed/performed              Past Medical History:  Diagnosis Date   Anxiety    Atrial fibrillation [facility])    sees Dr. Flora Lipps   Bilateral swelling of feet    Cancer (HCC)    breast   Chronic diarrhea    Chronic pain    Concussion 02/2007   ICU x 3 days   Depression    Hypertension    Hypothyroidism    Personal history of chemotherapy    Personal history of radiation therapy    Spinal stenosis    Thyroid disease ?1994   Vitamin D deficiency    Past Surgical History:  Procedure Laterality Date   BREAST BIOPSY Right 12/20/2019   x2   BREAST LUMPECTOMY Right 01/22/2020   BREAST LUMPECTOMY WITH RADIOACTIVE SEED AND SENTINEL LYMPH NODE BIOPSY Right 01/22/2020   Procedure: RIGHT BREAST LUMPECTOMY WITH RADIOACTIVE SEED X2 AND RIGHT SENTINEL LYMPH NODE MAPPING;  Surgeon: Harriette Bouillon, MD;  Location: [facility];  Service: General;  Laterality: Right;   BREAST SURGERY Right 03/1998   breast biopsy, benign   EYE SURGERY Right    PORT-A-CATH REMOVAL Right 04/30/2021   Procedure: REMOVAL PORT-A-CATH;  Surgeon: Harriette Bouillon, MD;  Location: [facility];  Service: General;  Laterality: Right;   PORTACATH PLACEMENT Right 01/22/2020   Procedure: INSERTION PORT-A-CATH WITH ULTRASOUND;  Surgeon: Harriette Bouillon, MD;  Location: [facility];  Service: General;  Laterality: Right;   PORTACATH PLACEMENT Right 02/28/2020   Procedure: PORT A CATH REVISION;   Surgeon: Harriette Bouillon, MD;  Location: [facility];  Service: General;  Laterality: Right;   REVERSE SHOULDER ARTHROPLASTY Left 02/02/2021   Procedure: LEFT REVERSE SHOULDER ARTHROPLASTY;  Surgeon: Cammy Copa, MD;  Location: [facility];  Service: Orthopedics;  Laterality: Left;   SHOULDER SURGERY Right    SPINAL FUSION  03/04/2011   with ORIF   Patient Active Problem List   Diagnosis Date Noted   Generalized anxiety disorder 11/21/2022   Alcohol abuse with alcohol-induced mood disorder (HCC) 11/21/2022   Alcohol abuse with alcohol-induced anxiety disorder (HCC) 11/21/2022   Pulmonary edema 11/18/2022   Acute respiratory failure with hypoxia and hypercapnia (HCC) 11/17/2022   Acute on chronic diastolic (congestive) heart failure (HCC) 11/17/2022   Arthritis of left shoulder region    S/P reverse total shoulder arthroplasty, 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   Vitamin D deficiency 01/15/2021   Vitamin B12 deficiency 01/15/2021   Primary insomnia 01/15/2021   Morbid obesity (HCC) 01/15/2021   Moderate recurrent major depression (HCC) 01/15/2021   Benzodiazepine dependence (HCC) 01/15/2021   Port-A-Cath in place 07/22/2020   Left leg cellulitis 04/17/2020   Cellulitis of left leg 04/17/2020   Hypokalemia 04/17/2020   Macrocytic anemia 04/17/2020   Anxiety 04/17/2020   Depression 04/17/2020   Chronic diarrhea 04/17/2020   Chemotherapy induced diarrhea  04/17/2020   Malignant neoplasm of upper-inner quadrant of right breast in female, estrogen receptor positive (HCC) 12/26/2019   Encephalopathy 02/02/2018   Hypothyroidism 02/02/2018   AKI (acute kidney injury) (HCC) 02/02/2018   Chronic back pain 02/02/2018   Alcohol abuse 02/02/2018   Weight gain 02/02/2018   Benign essential HTN 02/02/2018    PCP: Rebecka Apley, NP   REFERRING PROVIDER: Merryl Hacker, NP   REFERRING DIAG: M48.00 (ICD-10-CM) - Spinal stenosis, site unspecified  Rationale for Evaluation and Treatment:  Rehabilitation  THERAPY DIAG:  Other low back pain  Muscle weakness (generalized)  Difficulty in walking, not elsewhere classified  Acute pain of right knee  History of falling  ONSET DATE: Chronic back pain for many years but worse over last 6 months.   SUBJECTIVE:                                                                                                                                                                                           SUBJECTIVE STATEMENT: She relays she is taking some pain pouches orally that helps that she was prescribed at pain clinic.   PERTINENT HISTORY:  Lt rTSA 2022, anxiety, breast cancer with lumpectomy and radioactive seed placement 2021 and recent port removal, chronic pain, concussion 2008, HTN, depression, spinal fusion 2012, alcohol abuse  PAIN:  Are you having pain? Yes: NPRS scale: 4/10 Pain location: left side low back Pain description: sharp, ache Aggravating factors: has not been able to pinpoint this, sometimes she just wakes up with pain Relieving factors: sitting  PRECAUTIONS: Fall  WEIGHT BEARING RESTRICTIONS: No  FALLS:  Has patient fallen in last 6 months? Yes. Number of falls 1, fell in hall and broke her wrist. Relays several near misses.    PLOF: Independent with basic ADLs  PATIENT GOALS: reduce pain and improve balance.   NEXT MD VISIT: nothing scheduled  OBJECTIVE:   DIAGNOSTIC FINDINGS:  Lumbar CT 04/08/22  IMPRESSION: 1. No acute osseous abnormality. 2. Adjacent segment disease at L3-4 with mild spinal stenosis and moderate left neural foraminal stenosis, progressed from 2019. 3. Solid L4-5 fusion. 4. 2 cm left adrenal mass with indeterminate noncontrast CT characteristics but likely benign given 2 year size stability.  PATIENT SURVEYS:  Eval: FOTO 38% functional intake, goal is 45%  SCREENING FOR RED FLAGS: Bowel or bladder incontinence: No  COGNITION: Overall cognitive status: Within  functional limits for tasks assessed      POSTURE: slumped posture   LUMBAR ROM:   AROM eval 07/26/23  Flexion 50%   Extension 25%*   Right lateral flexion 50%*   Left lateral  flexion 50%*   Right rotation 50%*   Left rotation 50%    (Blank rows = not tested) *denotes pain   LOWER EXTREMITY MMT:    MMT tested in sitting Right eval Left eval  Hip flexion 4 4  Hip extension    Hip abduction 4 4  Hip adduction    Hip internal rotation    Hip external rotation    Knee flexion 4+ 4+  Knee extension 4+ 4+  Ankle dorsiflexion 5 5  Ankle plantarflexion 5 5  Ankle inversion    Ankle eversion     (Blank rows = not tested)  LUMBAR SPECIAL TESTS:  Direction preference for lumbar flexion vs extensoin  FUNCTIONAL TESTS:      GAIT: eval Comments: unsteady gait, uses quad cane for assistance  TODAY'S TREATMENT:                                                                                                                              DATE:07/26/23 Nu step L6X 15 min UE/LE Seated lumbar flexion stretch p ball roll outs 10 sec X 10 Seated pball core isometric shoulder extension 5 sec X 10 Seated pball core isometric hip flexion 5 sec X 10 Seated lumbar extension isometrics into yellow ball behind back 5 sec X10 Seated lumbar flexion/extension with 10# X 10 Sit to stand without UE support from 23 inch X10 Seated LAQ 5# X 20 bilat  Balance:  Walking with head nods in bars 3 round trips no UE support  Walking with head rotations in bars 3 round trips no UE support  Walking backwards in bars 3 round trips no UE support  Walking sideways in bars 3 round trips no UE support  DATE:07/21/23 Nu step L6X 15 min UE/LE Seated lumbar flexion stretch p ball roll outs 10 sec X 10 Seated pball core isometric shoulder extension 5 sec X 10 Seated pball core isometric hip flexion 5 sec X 10 Seated lumbar extension isometrics into yellow ball behind back 5 sec X10 Seated lumbar  flexion/extension with 10# X 10 Sit to stand without UE support from 23.5 inch X10 Seated LAQ 5# X 20 bilat  Balance:  Walking with head nods 20 feet with CGA  Walking with head rotations 20 feet with CGA  Walking backwards 20 feet with CGA  Walking through cones 15 feet X 4 with CGA  DATE:07/06/23 Nu step L6 X 5 min UE/LE Seated lumbar flexion stretch p ball roll outs 10 sec X 10 Seated pball core isometric shoulder extension 5 sec X 10 Seated pball core isometric hip flexion 5 sec X 10  Balance:  Feet together on airex pad 30 sec X 3  Feet apart on flat level ground eyes closed 20 sec X3  Tandem walk modified, in bars 3 round trips  Walking with head nods in bars 3 round trips  Walking with head rotations in bars 3 trips  Walking backwards in bars  3 round trips  Blaze pods SLS with one UE support 3 pods 30 sec X 3 and also using 3 pods and UE to turn off feet together 30 sec X 3  PATIENT EDUCATION:  Education details: HEP, PT plan of care Person educated: Patient Education method: Explanation, Demonstration, Verbal cues, and Handouts Education comprehension: verbalized understanding and needs further education  HOME EXERCISE PROGRAM: Access Code: [email or web address] URL: [email or web address] Date: 06/08/2023 Prepared by: Ivery Quale  Exercises - Supine Lower Trunk Rotation  - 2 x daily - 6 x weekly - 1 sets - 10 reps - 5 sec hold - Supine Single Knee to Chest Stretch  - 2 x daily - 6 x weekly - 1 sets - 2 reps - 30 sec hold - Supine Double Knee to Chest  - 2 x daily - 6 x weekly - 1 sets - 3 reps - 30 sec hold - Standing 'L' Stretch at Counter  - 2 x daily - 6 x weekly - 1 sets - 10 reps - 10 sec hold - Standing Tandem Balance with Counter Support  - 2 x daily - 6 x weekly - 1 sets - 3 reps - 30 sec hold - Walking with Head Rotation  - 2 x daily - 6 x weekly - 3 reps - Walking with Head Nod  - 2 x daily - 6 x weekly - 3 reps - Feet Together Balance at [employer]  Eyes Closed  - 2 x daily - 6 x weekly - 1 sets - 3 reps - 3- sec hold  ASSESSMENT:  CLINICAL IMPRESSION: She does appear to be making some functional progress overall with strength and balance. We will update measurements next visit.  OBJECTIVE IMPAIRMENTS: decreased activity tolerance, decreased balance, decreased coordination, decreased endurance, difficulty walking, decreased ROM, decreased strength, improper body mechanics, postural dysfunction, obesity, and pain.   ACTIVITY LIMITATIONS: carrying, lifting, bending, sitting, standing, squatting, sleeping, stairs, and transfers  PARTICIPATION LIMITATIONS: meal prep, cleaning, laundry, and community activity  PERSONAL FACTORS: Past/current experiences, Time since onset of injury/illness/exacerbation, and 3+ comorbidities: Lt rTSA 2022, anxiety, breast cancer with lumpectomy and radioactive seed placement 2021 and recent port removal, chronic pain, concussion 2008, HTN, depression, spinal fusion 2012, alcohol abuse  are also affecting patient's functional outcome.   REHAB POTENTIAL: Good  CLINICAL DECISION MAKING: Evolving/moderate complexity  EVALUATION COMPLEXITY: Moderate    GOALS: Short term PT Goals Target date: 07/06/2023   Pt will be I and compliant with HEP. Baseline:  Goal status: MET 07/21/23 Pt will decrease pain by 25% overall Baseline: Goal status: MET 07/21/23  Long term PT goals Target date:08/31/2023   Pt will improve lumbar ROM to [facility] to improve functional mobility Baseline: Goal status: ongoing 07/21/23 Pt will improve  hip/knee strength to at least 4+/5 MMT to improve functional strength Baseline: Goal status: ongoing 07/21/23 Pt will improve FOTO to at least 45% functional to show improved function Baseline: Goal status: ongoing 07/21/23 Pt will reduce back pain by overall 50% overall with usual activity Baseline: Goal status: ongoing 07/21/23 Pt will improve BERG balance test to 45 points to show improved  balance. Baseline: Goal status: ongoing 07/21/23  PLAN: PT FREQUENCY: 1-2 times per week   PT DURATION: 6-12 weeks  PLANNED INTERVENTIONS (unless contraindicated): aquatic PT, Canalith repositioning, cryotherapy, Electrical stimulation, Iontophoresis with 4 mg/ml dexamethasome, Moist heat, traction, Ultrasound, gait training, Therapeutic exercise, balance training, neuromuscular re-education, patient/family education, prosthetic training, manual techniques, passive ROM, dry needling, taping, vasopnuematic  device, vestibular, spinal manipulations, joint manipulations  PLAN FOR NEXT SESSION: update measurements for progress notes. flexion based stretching program, general strength and endurance, balance.    April Manson, PT,DPT 07/26/2023, 1:15 PM  Referring diagnosis? M48.00 (ICD-10-CM) - Spinal stenosis, site unspecified Treatment diagnosis? (if different than referring diagnosis) M54.59 What was this (referring dx) caused by? []  Surgery [x]  Fall [x]  Ongoing issue [x]  Arthritis []  Other: ____________  Laterality: []  Rt []  Lt [x]  Both  Check all possible CPT codes:  *CHOOSE 10 OR LESS*    [x]  97110 (Therapeutic Exercise)  []  92507 (SLP Treatment)  [x]  97112 (Neuro Re-ed)   []  92526 (Swallowing Treatment)   [x]  62130 (Gait Training)   []  [id number] (Cognitive Training, 1st 15 minutes) [x]  97140 (Manual Therapy)   []  97130 (Cognitive Training, each add'l 15 minutes)  []  97164 (Re-evaluation)                              []  Other, List CPT Code ____________  [x]  97530 (Therapeutic Activities)     []  97535 (Self Care)   []  All codes above (97110 - 97535)  [x]  97012 (Mechanical Traction)  [x]  97014 (E-stim Unattended)  []  97032 (E-stim manual)  []  97033 (Ionto)  []  97035 (Ultrasound) []  97750 (Physical Performance Training) [x]  [id number] (Aquatic Therapy) []  97016 (Vasopneumatic Device) []  [id number] (Paraffin) []  97034 (Contrast Bath) []  97597 (Wound Care 1st 20 sq cm) []  97598 (Wound  Care each add'l 20 sq cm) []  97760 (Orthotic Fabrication, Fitting, Training Initial) []  [id number] (Prosthetic Management and Training Initial) []  [id number] (Orthotic or Prosthetic Training/ Modification Subsequent)

## 2023-07-28 ENCOUNTER — Encounter: Payer: Medicare PPO | Admitting: Physical Therapy

## 2023-08-08 ENCOUNTER — Ambulatory Visit (INDEPENDENT_AMBULATORY_CARE_PROVIDER_SITE_OTHER): Payer: Medicare PPO | Admitting: Physical Therapy

## 2023-08-08 ENCOUNTER — Encounter: Payer: Self-pay | Admitting: Physical Therapy

## 2023-08-08 DIAGNOSIS — Z9181 History of falling: Secondary | ICD-10-CM

## 2023-08-08 DIAGNOSIS — M6281 Muscle weakness (generalized): Secondary | ICD-10-CM | POA: Diagnosis not present

## 2023-08-08 DIAGNOSIS — R262 Difficulty in walking, not elsewhere classified: Secondary | ICD-10-CM | POA: Diagnosis not present

## 2023-08-08 DIAGNOSIS — M25561 Pain in right knee: Secondary | ICD-10-CM | POA: Diagnosis not present

## 2023-08-08 DIAGNOSIS — M5459 Other low back pain: Secondary | ICD-10-CM

## 2023-08-08 NOTE — Therapy (Signed)
OUTPATIENT PHYSICAL THERAPY TREATMENT/Progress note/ [employer] recert   Patient Name: [name] MRN: [id number] DOB:1951-11-23, 72 y.o., female Today's Date: 08/08/2023  END OF SESSION:  PT End of Session - 08/08/23 1212     Visit Number 10    Number of Visits 20    Date for PT Re-Evaluation 10/31/23    Authorization Type [employer]    PT Start Time 1153    PT Stop Time 1231    PT Time Calculation (min) 38 min    Activity Tolerance Patient tolerated treatment well    Behavior During Therapy WFL for tasks assessed/performed              Past Medical History:  Diagnosis Date   Anxiety    Atrial fibrillation [facility])    sees Dr. Flora Lipps   Bilateral swelling of feet    Cancer (HCC)    breast   Chronic diarrhea    Chronic pain    Concussion 02/2007   ICU x 3 days   Depression    Hypertension    Hypothyroidism    Personal history of chemotherapy    Personal history of radiation therapy    Spinal stenosis    Thyroid disease ?1994   Vitamin D deficiency    Past Surgical History:  Procedure Laterality Date   BREAST BIOPSY Right 12/20/2019   x2   BREAST LUMPECTOMY Right 01/22/2020   BREAST LUMPECTOMY WITH RADIOACTIVE SEED AND SENTINEL LYMPH NODE BIOPSY Right 01/22/2020   Procedure: RIGHT BREAST LUMPECTOMY WITH RADIOACTIVE SEED X2 AND RIGHT SENTINEL LYMPH NODE MAPPING;  Surgeon: Harriette Bouillon, MD;  Location: [facility];  Service: General;  Laterality: Right;   BREAST SURGERY Right 03/1998   breast biopsy, benign   EYE SURGERY Right    PORT-A-CATH REMOVAL Right 04/30/2021   Procedure: REMOVAL PORT-A-CATH;  Surgeon: Harriette Bouillon, MD;  Location: [facility];  Service: General;  Laterality: Right;   PORTACATH PLACEMENT Right 01/22/2020   Procedure: INSERTION PORT-A-CATH WITH ULTRASOUND;  Surgeon: Harriette Bouillon, MD;  Location: [facility];  Service: General;  Laterality: Right;   PORTACATH PLACEMENT Right 02/28/2020    Procedure: PORT A CATH REVISION;  Surgeon: Harriette Bouillon, MD;  Location: [facility] OR;  Service: General;  Laterality: Right;   REVERSE SHOULDER ARTHROPLASTY Left 02/02/2021   Procedure: LEFT REVERSE SHOULDER ARTHROPLASTY;  Surgeon: Cammy Copa, MD;  Location: [facility] OR;  Service: Orthopedics;  Laterality: Left;   SHOULDER SURGERY Right    SPINAL FUSION  03/04/2011   with ORIF   Patient Active Problem List   Diagnosis Date Noted   Generalized anxiety disorder 11/21/2022   Alcohol abuse with alcohol-induced mood disorder (HCC) 11/21/2022   Alcohol abuse with alcohol-induced anxiety disorder (HCC) 11/21/2022   Pulmonary edema 11/18/2022   Acute respiratory failure with hypoxia and hypercapnia (HCC) 11/17/2022   Acute on chronic diastolic (congestive) heart failure (HCC) 11/17/2022   Arthritis of left shoulder region    S/P reverse total shoulder arthroplasty, 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   Vitamin D deficiency 01/15/2021   Vitamin B12 deficiency 01/15/2021   Primary insomnia 01/15/2021   Morbid obesity (HCC) 01/15/2021   Moderate recurrent major depression (HCC) 01/15/2021   Benzodiazepine dependence (HCC) 01/15/2021   Port-A-Cath in place 07/22/2020   Left leg cellulitis 04/17/2020   Cellulitis of left leg 04/17/2020   Hypokalemia 04/17/2020   Macrocytic anemia 04/17/2020   Anxiety 04/17/2020   Depression 04/17/2020   Chronic diarrhea 04/17/2020   Chemotherapy  induced diarrhea 04/17/2020   Malignant neoplasm of upper-inner quadrant of right breast in female, estrogen receptor positive (HCC) 12/26/2019   Encephalopathy 02/02/2018   Hypothyroidism 02/02/2018   AKI (acute kidney injury) (HCC) 02/02/2018   Chronic back pain 02/02/2018   Alcohol abuse 02/02/2018   Weight gain 02/02/2018   Benign essential HTN 02/02/2018    PCP: Rebecka Apley, NP   REFERRING PROVIDER: Merryl Hacker, NP   REFERRING DIAG: M48.00 (ICD-10-CM) - Spinal stenosis, site unspecified  Rationale for  Evaluation and Treatment: Rehabilitation  THERAPY DIAG:  Other low back pain  Muscle weakness (generalized)  Difficulty in walking, not elsewhere classified  Acute pain of right knee  History of falling  ONSET DATE: Chronic back pain for many years but worse over last 6 months.   SUBJECTIVE:                                                                                                                                                                                           SUBJECTIVE STATEMENT: She reports about 3/10 overall back pain upon arrival. She has been trying to be more active overall.    PERTINENT HISTORY:  Lt rTSA 2022, anxiety, breast cancer with lumpectomy and radioactive seed placement 2021 and recent port removal, chronic pain, concussion 2008, HTN, depression, spinal fusion 2012, alcohol abuse  PAIN:  Are you having pain? Yes: NPRS scale: 3/10 Pain location: left side low back Pain description: sharp, ache Aggravating factors: has not been able to pinpoint this, sometimes she just wakes up with pain Relieving factors: sitting  PRECAUTIONS: Fall  WEIGHT BEARING RESTRICTIONS: No  FALLS:  Has patient fallen in last 6 months? Yes. Number of falls 1, fell in hall and broke her wrist. Relays several near misses.    PLOF: Independent with basic ADLs  PATIENT GOALS: reduce pain and improve balance.   NEXT MD VISIT: nothing scheduled  OBJECTIVE:   DIAGNOSTIC FINDINGS:  Lumbar CT 04/08/22  IMPRESSION: 1. No acute osseous abnormality. 2. Adjacent segment disease at L3-4 with mild spinal stenosis and moderate left neural foraminal stenosis, progressed from 2019. 3. Solid L4-5 fusion. 4. 2 cm left adrenal mass with indeterminate noncontrast CT characteristics but likely benign given 2 year size stability.  PATIENT SURVEYS:  Eval: FOTO 38% functional intake, goal is 45%  SCREENING FOR RED FLAGS: Bowel or bladder incontinence: No  COGNITION: Overall  cognitive status: Within functional limits for tasks assessed      POSTURE: slumped posture   LUMBAR ROM:   AROM eval 08/08/23  Flexion 50% WNL  Extension 25%* 25%  Right lateral flexion 50%*  Left lateral flexion 50%*   Right rotation 50%* 75%  Left rotation 50% 75%   (Blank rows = not tested) *denotes pain   LOWER EXTREMITY MMT:    MMT tested in sitting Right eval Left eval Right 08/08/23 Left 08/08/23  Hip flexion 4 4 4+ 4+  Hip extension      Hip abduction 4 4 4+ 4+  Hip adduction      Hip internal rotation      Hip external rotation      Knee flexion 4+ 4+ 4+ 5  Knee extension 4+ 4+ 4+ 5  Ankle dorsiflexion 5 5    Ankle plantarflexion 5 5    Ankle inversion      Ankle eversion       (Blank rows = not tested)  LUMBAR SPECIAL TESTS:  Direction preference for lumbar flexion vs extensoin  FUNCTIONAL TESTS:   [facility] PT Assessment - 08/08/23 0001       Berg Balance Test   Sit to Stand Able to stand without using hands and stabilize independently    Standing Unsupported Able to stand safely 2 minutes    Sitting with Back Unsupported but Feet Supported on Floor or Stool Able to sit safely and securely 2 minutes    Stand to Sit Sits safely with minimal use of hands    Transfers Able to transfer safely, minor use of hands    Standing Unsupported with Eyes Closed Able to stand 10 seconds with supervision    Standing Unsupported with Feet Together Able to place feet together independently and stand for 1 minute with supervision    From Standing, Reach Forward with Outstretched Arm Can reach forward >12 cm safely (5")    From Standing Position, Pick up Object from Floor Able to pick up shoe, needs supervision    From Standing Position, Turn to Look Behind Over each Shoulder Looks behind from both sides and weight shifts well    Turn 360 Degrees Able to turn 360 degrees safely but slowly    Standing Unsupported, Alternately Place Feet on Step/Stool Able to stand  independently and safely and complete 8 steps in 20 seconds    Standing Unsupported, One Foot in Front Needs help to step but can hold 15 seconds    Standing on One Leg Unable to try or needs assist to prevent fall    Total Score 43             [facility] PT Assessment - 08/08/23 0001       Berg Balance Test   Sit to Stand Able to stand without using hands and stabilize independently    Standing Unsupported Able to stand safely 2 minutes    Sitting with Back Unsupported but Feet Supported on Floor or Stool Able to sit safely and securely 2 minutes    Stand to Sit Sits safely with minimal use of hands    Transfers Able to transfer safely, minor use of hands    Standing Unsupported with Eyes Closed Able to stand 10 seconds with supervision    Standing Unsupported with Feet Together Able to place feet together independently and stand for 1 minute with supervision    From Standing, Reach Forward with Outstretched Arm Can reach forward >12 cm safely (5")    From Standing Position, Pick up Object from Floor Able to pick up shoe, needs supervision    From Standing Position, Turn to Look Behind Over each Shoulder Looks behind from both sides  and weight shifts well    Turn 360 Degrees Able to turn 360 degrees safely but slowly    Standing Unsupported, Alternately Place Feet on Step/Stool Able to stand independently and safely and complete 8 steps in 20 seconds    Standing Unsupported, One Foot in Front Needs help to step but can hold 15 seconds    Standing on One Leg Unable to try or needs assist to prevent fall    Total Score 43               GAIT: eval Comments: unsteady gait, uses quad cane for assistance  TODAY'S TREATMENT:                                                                                                                              DATE:08/08/23 Nu step L6X 15 min UE/LE Seated lumbar flexion stretch p ball roll outs 10 sec X 10 Seated pball core isometric shoulder  extension 5 sec X 10 Seated pball core isometric hip flexion 5 sec X 10 Sit to stand without UE support from 23 inch X10   Balance:  Walking with head nods in bars 3 round trips no UE support  Walking with head rotations in bars 3 round trips no UE support  Walking backwards in bars 3 round trips no UE support  Walking sideways in bars 3 round trips no UE support BERG balance test, see above for details.   PATIENT EDUCATION:  Education details: HEP, PT plan of care Person educated: Patient Education method: Explanation, Demonstration, Verbal cues, and Handouts Education comprehension: verbalized understanding and needs further education  HOME EXERCISE PROGRAM: Access Code: [email or web address] URL: [email or web address] Date: 06/08/2023 Prepared by: Ivery Quale  Exercises - Supine Lower Trunk Rotation  - 2 x daily - 6 x weekly - 1 sets - 10 reps - 5 sec hold - Supine Single Knee to Chest Stretch  - 2 x daily - 6 x weekly - 1 sets - 2 reps - 30 sec hold - Supine Double Knee to Chest  - 2 x daily - 6 x weekly - 1 sets - 3 reps - 30 sec hold - Standing 'L' Stretch at Counter  - 2 x daily - 6 x weekly - 1 sets - 10 reps - 10 sec hold - Standing Tandem Balance with Counter Support  - 2 x daily - 6 x weekly - 1 sets - 3 reps - 30 sec hold - Walking with Head Rotation  - 2 x daily - 6 x weekly - 3 reps - Walking with Head Nod  - 2 x daily - 6 x weekly - 3 reps - Feet Together Balance at [employer] Eyes Closed  - 2 x daily - 6 x weekly - 1 sets - 3 reps - 3- sec hold  ASSESSMENT:  CLINICAL IMPRESSION: Updated measurements show she is making some functional progress overall with strength and balance. Her BERG balance test  improved from 32 to 43 points which indicates she has made good progress but <45 shows increased risk of falls and AD is recommended for gait still. I did recert her PT plan of care as the date had expired and she will continue to benefit from skilled PT to improve  function and reduce her risk of falling.   OBJECTIVE IMPAIRMENTS: decreased activity tolerance, decreased balance, decreased coordination, decreased endurance, difficulty walking, decreased ROM, decreased strength, improper body mechanics, postural dysfunction, obesity, and pain.   ACTIVITY LIMITATIONS: carrying, lifting, bending, sitting, standing, squatting, sleeping, stairs, and transfers  PARTICIPATION LIMITATIONS: meal prep, cleaning, laundry, and community activity  PERSONAL FACTORS: Past/current experiences, Time since onset of injury/illness/exacerbation, and 3+ comorbidities: Lt rTSA 2022, anxiety, breast cancer with lumpectomy and radioactive seed placement 2021 and recent port removal, chronic pain, concussion 2008, HTN, depression, spinal fusion 2012, alcohol abuse  are also affecting patient's functional outcome.   REHAB POTENTIAL: Good  CLINICAL DECISION MAKING: Evolving/moderate complexity  EVALUATION COMPLEXITY: Moderate    GOALS: Short term PT Goals Target date: 07/06/2023   Pt will be I and compliant with HEP. Baseline:  Goal status: MET 07/21/23 Pt will decrease pain by 25% overall Baseline: Goal status: MET 07/21/23  Long term PT goals Target date:10/31/2023   Pt will improve lumbar ROM to [facility] to improve functional mobility Baseline: Goal status: partially met, only missing extension 08/08/23 Pt will improve  hip/knee strength to at least 4+/5 MMT to improve functional strength Baseline: Goal status: MET 08/08/23 Pt will improve FOTO to at least 45% functional to show improved function Baseline: Goal status: ongoing 07/21/23 Pt will reduce back pain by overall 50% overall with usual activity Baseline: Goal status: ongoing 08/08/23 Pt will improve BERG balance test to 45 points to show improved balance. Baseline: Goal status: ongoing improved to 43 on 08/08/23  PLAN: PT FREQUENCY: 1-2 times per week   PT DURATION: 6-12 weeks  PLANNED INTERVENTIONS (unless  contraindicated): aquatic PT, Canalith repositioning, cryotherapy, Electrical stimulation, Iontophoresis with 4 mg/ml dexamethasome, Moist heat, traction, Ultrasound, gait training, Therapeutic exercise, balance training, neuromuscular re-education, patient/family education, prosthetic training, manual techniques, passive ROM, dry needling, taping, vasopnuematic device, vestibular, spinal manipulations, joint manipulations  PLAN FOR NEXT SESSION: flexion based stretching program, general strength and endurance, balance.    April Manson, PT,DPT 08/08/2023, 1:30 PM  Referring diagnosis? M48.00 (ICD-10-CM) - Spinal stenosis, site unspecified Treatment diagnosis? (if different than referring diagnosis) M54.59 What was this (referring dx) caused by? []  Surgery [x]  Fall [x]  Ongoing issue [x]  Arthritis []  Other: ____________  Laterality: []  Rt []  Lt [x]  Both  Check all possible CPT codes:  *CHOOSE 10 OR LESS*    [x]  97110 (Therapeutic Exercise)  []  92507 (SLP Treatment)  [x]  97112 (Neuro Re-ed)   []  92526 (Swallowing Treatment)   [x]  97116 (Gait Training)   []  [id number] (Cognitive Training, 1st 15 minutes) [x]  97140 (Manual Therapy)   []  97130 (Cognitive Training, each add'l 15 minutes)  []  40981 (Re-evaluation)                              []  Other, List CPT Code ____________  [x]  97530 (Therapeutic Activities)     []  97535 (Self Care)   []  All codes above (97110 - 97535)  [x]  97012 (Mechanical Traction)  [x]  97014 (E-stim Unattended)  []  97032 (E-stim manual)  []  97033 (Ionto)  []  97035 (Ultrasound) []  97750 (Physical Performance  Training) [x]  [id number] (Aquatic Therapy) []  [id number] (Vasopneumatic Device) []  95284 (Paraffin) []  97034 (Contrast Bath) []  97597 (Wound Care 1st 20 sq cm) []  97598 (Wound Care each add'l 20 sq cm) []  97760 (Orthotic Fabrication, Fitting, Training Initial) []  [id number] (Prosthetic Management and Training Initial) []  [id number] (Orthotic or Prosthetic Training/  Modification Subsequent)

## 2023-08-10 ENCOUNTER — Ambulatory Visit: Payer: Medicare PPO | Admitting: Physical Therapy

## 2023-08-10 ENCOUNTER — Encounter: Payer: Self-pay | Admitting: Physical Therapy

## 2023-08-10 DIAGNOSIS — R262 Difficulty in walking, not elsewhere classified: Secondary | ICD-10-CM

## 2023-08-10 DIAGNOSIS — M5459 Other low back pain: Secondary | ICD-10-CM

## 2023-08-10 DIAGNOSIS — M6281 Muscle weakness (generalized): Secondary | ICD-10-CM | POA: Diagnosis not present

## 2023-08-10 NOTE — Therapy (Signed)
OUTPATIENT PHYSICAL THERAPY TREATMENT/Progress note/ [employer] recert   Patient Name: [name] MRN: [id number] DOB:May 17, 1951, 72 y.o., female Today's Date: 08/10/2023  END OF SESSION:  PT End of Session - 08/10/23 1202     Visit Number 11    Number of Visits 20    Date for PT Re-Evaluation 10/31/23    Authorization Type [employer]    PT Start Time 1145    PT Stop Time 1224    PT Time Calculation (min) 39 min    Activity Tolerance Patient tolerated treatment well    Behavior During Therapy WFL for tasks assessed/performed              Past Medical History:  Diagnosis Date   Anxiety    Atrial fibrillation [facility])    sees Dr. Flora Lipps   Bilateral swelling of feet    Cancer (HCC)    breast   Chronic diarrhea    Chronic pain    Concussion 02/2007   ICU x 3 days   Depression    Hypertension    Hypothyroidism    Personal history of chemotherapy    Personal history of radiation therapy    Spinal stenosis    Thyroid disease ?1994   Vitamin D deficiency    Past Surgical History:  Procedure Laterality Date   BREAST BIOPSY Right 12/20/2019   x2   BREAST LUMPECTOMY Right 01/22/2020   BREAST LUMPECTOMY WITH RADIOACTIVE SEED AND SENTINEL LYMPH NODE BIOPSY Right 01/22/2020   Procedure: RIGHT BREAST LUMPECTOMY WITH RADIOACTIVE SEED X2 AND RIGHT SENTINEL LYMPH NODE MAPPING;  Surgeon: Harriette Bouillon, MD;  Location: [facility];  Service: General;  Laterality: Right;   BREAST SURGERY Right 03/1998   breast biopsy, benign   EYE SURGERY Right    PORT-A-CATH REMOVAL Right 04/30/2021   Procedure: REMOVAL PORT-A-CATH;  Surgeon: Harriette Bouillon, MD;  Location: [facility];  Service: General;  Laterality: Right;   PORTACATH PLACEMENT Right 01/22/2020   Procedure: INSERTION PORT-A-CATH WITH ULTRASOUND;  Surgeon: Harriette Bouillon, MD;  Location: [facility];  Service: General;  Laterality: Right;   PORTACATH PLACEMENT Right 02/28/2020    Procedure: PORT A CATH REVISION;  Surgeon: Harriette Bouillon, MD;  Location: [facility] OR;  Service: General;  Laterality: Right;   REVERSE SHOULDER ARTHROPLASTY Left 02/02/2021   Procedure: LEFT REVERSE SHOULDER ARTHROPLASTY;  Surgeon: Cammy Copa, MD;  Location: [facility] OR;  Service: Orthopedics;  Laterality: Left;   SHOULDER SURGERY Right    SPINAL FUSION  03/04/2011   with ORIF   Patient Active Problem List   Diagnosis Date Noted   Generalized anxiety disorder 11/21/2022   Alcohol abuse with alcohol-induced mood disorder (HCC) 11/21/2022   Alcohol abuse with alcohol-induced anxiety disorder (HCC) 11/21/2022   Pulmonary edema 11/18/2022   Acute respiratory failure with hypoxia and hypercapnia (HCC) 11/17/2022   Acute on chronic diastolic (congestive) heart failure (HCC) 11/17/2022   Arthritis of left shoulder region    S/P reverse total shoulder arthroplasty, 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   Vitamin D deficiency 01/15/2021   Vitamin B12 deficiency 01/15/2021   Primary insomnia 01/15/2021   Morbid obesity (HCC) 01/15/2021   Moderate recurrent major depression (HCC) 01/15/2021   Benzodiazepine dependence (HCC) 01/15/2021   Port-A-Cath in place 07/22/2020   Left leg cellulitis 04/17/2020   Cellulitis of left leg 04/17/2020   Hypokalemia 04/17/2020   Macrocytic anemia 04/17/2020   Anxiety 04/17/2020   Depression 04/17/2020   Chronic diarrhea 04/17/2020   Chemotherapy  induced diarrhea 04/17/2020   Malignant neoplasm of upper-inner quadrant of right breast in female, estrogen receptor positive (HCC) 12/26/2019   Encephalopathy 02/02/2018   Hypothyroidism 02/02/2018   AKI (acute kidney injury) (HCC) 02/02/2018   Chronic back pain 02/02/2018   Alcohol abuse 02/02/2018   Weight gain 02/02/2018   Benign essential HTN 02/02/2018    PCP: Rebecka Apley, NP   REFERRING PROVIDER: Merryl Hacker, NP   REFERRING DIAG: M48.00 (ICD-10-CM) - Spinal stenosis, site unspecified  Rationale for  Evaluation and Treatment: Rehabilitation  THERAPY DIAG:  Other low back pain  Muscle weakness (generalized)  Difficulty in walking, not elsewhere classified  ONSET DATE: Chronic back pain for many years but worse over last 6 months.   SUBJECTIVE:                                                                                                                                                                                           SUBJECTIVE STATEMENT: She reports the pain is not as bad today PERTINENT HISTORY:  Lt rTSA 2022, anxiety, breast cancer with lumpectomy and radioactive seed placement 2021 and recent port removal, chronic pain, concussion 2008, HTN, depression, spinal fusion 2012, alcohol abuse  PAIN:  Are you having pain? Yes: NPRS scale: 2/10 Pain location: left side low back Pain description: sharp, ache Aggravating factors: has not been able to pinpoint this, sometimes she just wakes up with pain Relieving factors: sitting  PRECAUTIONS: Fall  WEIGHT BEARING RESTRICTIONS: No  FALLS:  Has patient fallen in last 6 months? Yes. Number of falls 1, fell in hall and broke her wrist. Relays several near misses.    PLOF: Independent with basic ADLs  PATIENT GOALS: reduce pain and improve balance.   NEXT MD VISIT: nothing scheduled  OBJECTIVE:   DIAGNOSTIC FINDINGS:  Lumbar CT 04/08/22  IMPRESSION: 1. No acute osseous abnormality. 2. Adjacent segment disease at L3-4 with mild spinal stenosis and moderate left neural foraminal stenosis, progressed from 2019. 3. Solid L4-5 fusion. 4. 2 cm left adrenal mass with indeterminate noncontrast CT characteristics but likely benign given 2 year size stability.  PATIENT SURVEYS:  Eval: FOTO 38% functional intake, goal is 45%  SCREENING FOR RED FLAGS: Bowel or bladder incontinence: No  COGNITION: Overall cognitive status: Within functional limits for tasks assessed      POSTURE: slumped posture   LUMBAR ROM:    AROM eval 08/08/23  Flexion 50% WNL  Extension 25%* 25%  Right lateral flexion 50%*   Left lateral flexion 50%*   Right rotation 50%* 75%  Left rotation 50% 75%   (Blank rows = not  tested) *denotes pain   LOWER EXTREMITY MMT:    MMT tested in sitting Right eval Left eval Right 08/08/23 Left 08/08/23  Hip flexion 4 4 4+ 4+  Hip extension      Hip abduction 4 4 4+ 4+  Hip adduction      Hip internal rotation      Hip external rotation      Knee flexion 4+ 4+ 4+ 5  Knee extension 4+ 4+ 4+ 5  Ankle dorsiflexion 5 5    Ankle plantarflexion 5 5    Ankle inversion      Ankle eversion       (Blank rows = not tested)  LUMBAR SPECIAL TESTS:  Direction preference for lumbar flexion vs extensoin  FUNCTIONAL TESTS:         GAIT: eval Comments: unsteady gait, uses quad cane for assistance  TODAY'S TREATMENT:                                                                                                                              DATE:08/08/23 Nu step L6X 15 min UE/LE Seated lumbar flexion stretch p ball roll outs 10 sec X 10 Seated pball core isometric shoulder extension 5 sec X 10 Seated pball core isometric hip flexion 5 sec X 10 Sit to stand X 10 without UE support from standard chair, no UE support with cues and demo to lean forward more before standing Step ups on 6 inch step X 10 bilat with one UE support   Balance: Tandem balance 30 sec X 2 bilat Walking backwards in bars 3 round trips no UE support Walking sideways in bars 3 round trips no UE support  DATE:08/08/23 Nu step L6X 15 min UE/LE Seated lumbar flexion stretch p ball roll outs 10 sec X 10 Seated pball core isometric shoulder extension 5 sec X 10 Seated pball core isometric hip flexion 5 sec X 10 Sit to stand without UE support from 23 inch X10   Balance:  Walking with head nods in bars 3 round trips no UE support  Walking with head rotations in bars 3 round trips no UE support  Walking  backwards in bars 3 round trips no UE support  Walking sideways in bars 3 round trips no UE support BERG balance test, see above for details.   PATIENT EDUCATION:  Education details: HEP, PT plan of care Person educated: Patient Education method: Explanation, Demonstration, Verbal cues, and Handouts Education comprehension: verbalized understanding and needs further education  HOME EXERCISE PROGRAM: Access Code: [email or web address] URL: [email or web address] Date: 06/08/2023 Prepared by: Ivery Quale  Exercises - Supine Lower Trunk Rotation  - 2 x daily - 6 x weekly - 1 sets - 10 reps - 5 sec hold - Supine Single Knee to Chest Stretch  - 2 x daily - 6 x weekly - 1 sets - 2 reps - 30 sec hold - Supine Double Knee to Chest  - 2 x  daily - 6 x weekly - 1 sets - 3 reps - 30 sec hold - Standing 'L' Stretch at [employer]  - 2 x daily - 6 x weekly - 1 sets - 10 reps - 10 sec hold - Standing Tandem Balance with Counter Support  - 2 x daily - 6 x weekly - 1 sets - 3 reps - 30 sec hold - Walking with Head Rotation  - 2 x daily - 6 x weekly - 3 reps - Walking with Head Nod  - 2 x daily - 6 x weekly - 3 reps - Feet Together Balance at [employer] Eyes [employer]osed  - 2 x daily - 6 x weekly - 1 sets - 3 reps - 3- sec hold  ASSESSMENT:  CLINICAL IMPRESSION: Pain was overall less today so I did progress her functional strength program today with good tolerance noted.   OBJECTIVE IMPAIRMENTS: decreased activity tolerance, decreased balance, decreased coordination, decreased endurance, difficulty walking, decreased ROM, decreased strength, improper body mechanics, postural dysfunction, obesity, and pain.   ACTIVITY LIMITATIONS: carrying, lifting, bending, sitting, standing, squatting, sleeping, stairs, and transfers  PARTICIPATION LIMITATIONS: meal prep, cleaning, laundry, and community activity  PERSONAL FACTORS: Past/current experiences, Time since onset of injury/illness/exacerbation, and 3+  comorbidities: Lt rTSA 2022, anxiety, breast cancer with lumpectomy and radioactive seed placement 2021 and recent port removal, chronic pain, concussion 2008, HTN, depression, spinal fusion 2012, alcohol abuse  are also affecting patient's functional outcome.   REHAB POTENTIAL: Good  CLINICAL DECISION MAKING: Evolving/moderate complexity  EVALUATION COMPLEXITY: Moderate    GOALS: Short term PT Goals Target date: 07/06/2023   Pt will be I and compliant with HEP. Baseline:  Goal status: MET 07/21/23 Pt will decrease pain by 25% overall Baseline: Goal status: MET 07/21/23  Long term PT goals Target date:10/31/2023   Pt will improve lumbar ROM to [facility] to improve functional mobility Baseline: Goal status: partially met, only missing extension 08/08/23 Pt will improve  hip/knee strength to at least 4+/5 MMT to improve functional strength Baseline: Goal status: MET 08/08/23 Pt will improve FOTO to at least 45% functional to show improved function Baseline: Goal status: ongoing 07/21/23 Pt will reduce back pain by overall 50% overall with usual activity Baseline: Goal status: ongoing 08/08/23 Pt will improve BERG balance test to 45 points to show improved balance. Baseline: Goal status: ongoing improved to 43 on 08/08/23  PLAN: PT FREQUENCY: 1-2 times per week   PT DURATION: 6-12 weeks  PLANNED INTERVENTIONS (unless contraindicated): aquatic PT, Canalith repositioning, cryotherapy, Electrical stimulation, Iontophoresis with 4 mg/ml dexamethasome, Moist heat, traction, Ultrasound, gait training, Therapeutic exercise, balance training, neuromuscular re-education, patient/family education, prosthetic training, manual techniques, passive ROM, dry needling, taping, vasopnuematic device, vestibular, spinal manipulations, joint manipulations  PLAN FOR NEXT SESSION: flexion based stretching program, general strength and endurance, balance.    April Manson, PT,DPT 08/10/2023, 12:02  PM  Referring diagnosis? M48.00 (ICD-10-CM) - Spinal stenosis, site unspecified Treatment diagnosis? (if different than referring diagnosis) M54.59 What was this (referring dx) caused by? []  Surgery [x]  Fall [x]  Ongoing issue [x]  Arthritis []  Other: ____________  Laterality: []  Rt []  Lt [x]  Both  Check all possible CPT codes:  *CHOOSE 10 OR LESS*    [x]  97110 (Therapeutic Exercise)  []  92507 (SLP Treatment)  [x]  97112 (Neuro Re-ed)   []  92526 (Swallowing Treatment)   [x]  97116 (Gait Training)   []  [id number] (Cognitive Training, 1st 15 minutes) [x]  97140 (Manual Therapy)   []  97130 (Cognitive  Training, each add'l 15 minutes)  []  [id number] (Re-evaluation)                              []  Other, List CPT Code ____________  [x]  97530 (Therapeutic Activities)     []  97535 (Self Care)   []  All codes above (97110 - 97535)  [x]  97012 (Mechanical Traction)  [x]  97014 (E-stim Unattended)  []  97032 (E-stim manual)  []  97033 (Ionto)  []  97035 (Ultrasound) []  97750 (Physical Performance Training) [x]  [id number] (Aquatic Therapy) []  97016 (Vasopneumatic Device) []  [id number] (Paraffin) []  97034 (Contrast Bath) []  97597 (Wound Care 1st 20 sq cm) []  97598 (Wound Care each add'l 20 sq cm) []  97760 (Orthotic Fabrication, Fitting, Training Initial) []  [id number] (Prosthetic Management and Training Initial) []  [id number] (Orthotic or Prosthetic Training/ Modification Subsequent)

## 2023-08-16 ENCOUNTER — Encounter: Payer: Self-pay | Admitting: Physical Therapy

## 2023-08-16 ENCOUNTER — Ambulatory Visit: Payer: Medicare PPO | Admitting: Physical Therapy

## 2023-08-16 ENCOUNTER — Other Ambulatory Visit: Payer: Self-pay | Admitting: Hematology and Oncology

## 2023-08-16 ENCOUNTER — Encounter: Payer: Self-pay | Admitting: Hematology and Oncology

## 2023-08-16 DIAGNOSIS — M5459 Other low back pain: Secondary | ICD-10-CM

## 2023-08-16 DIAGNOSIS — M6281 Muscle weakness (generalized): Secondary | ICD-10-CM | POA: Diagnosis not present

## 2023-08-16 DIAGNOSIS — Z853 Personal history of malignant neoplasm of breast: Secondary | ICD-10-CM

## 2023-08-16 DIAGNOSIS — R262 Difficulty in walking, not elsewhere classified: Secondary | ICD-10-CM

## 2023-08-16 NOTE — Therapy (Signed)
OUTPATIENT PHYSICAL THERAPY TREATMENT  Patient Name: [name] MRN: [id number] DOB:Mar 21, 1951, 72 y.o., female Today's Date: 08/16/2023  END OF SESSION:  PT End of Session - 08/16/23 1115     Visit Number 12    Number of Visits 20    Date for PT Re-Evaluation 10/31/23    Authorization Type [employer]    PT Start Time 1100    PT Stop Time 1140    PT Time Calculation (min) 40 min    Activity Tolerance Patient tolerated treatment well    Behavior During Therapy WFL for tasks assessed/performed               Past Medical History:  Diagnosis Date   Anxiety    Atrial fibrillation [facility])    sees Dr. Flora Lipps   Bilateral swelling of feet    Cancer [facility])    breast   Chronic diarrhea    Chronic pain    Concussion 02/2007   ICU x 3 days   Depression    Hypertension    Hypothyroidism    Personal history of chemotherapy    Personal history of radiation therapy    Spinal stenosis    Thyroid disease ?1994   Vitamin D deficiency    Past Surgical History:  Procedure Laterality Date   BREAST BIOPSY Right 12/20/2019   x2   BREAST LUMPECTOMY Right 01/22/2020   BREAST LUMPECTOMY WITH RADIOACTIVE SEED AND SENTINEL LYMPH NODE BIOPSY Right 01/22/2020   Procedure: RIGHT BREAST LUMPECTOMY WITH RADIOACTIVE SEED X2 AND RIGHT SENTINEL LYMPH NODE MAPPING;  Surgeon: Harriette Bouillon, MD;  Location: [facility];  Service: General;  Laterality: Right;   BREAST SURGERY Right 03/1998   breast biopsy, benign   EYE SURGERY Right    PORT-A-CATH REMOVAL Right 04/30/2021   Procedure: REMOVAL PORT-A-CATH;  Surgeon: Harriette Bouillon, MD;  Location: [facility];  Service: General;  Laterality: Right;   PORTACATH PLACEMENT Right 01/22/2020   Procedure: INSERTION PORT-A-CATH WITH ULTRASOUND;  Surgeon: Harriette Bouillon, MD;  Location: [facility];  Service: General;  Laterality: Right;   PORTACATH PLACEMENT Right 02/28/2020   Procedure: PORT A CATH REVISION;   Surgeon: Harriette Bouillon, MD;  Location: [facility];  Service: General;  Laterality: Right;   REVERSE SHOULDER ARTHROPLASTY Left 02/02/2021   Procedure: LEFT REVERSE SHOULDER ARTHROPLASTY;  Surgeon: Cammy Copa, MD;  Location: [facility] OR;  Service: Orthopedics;  Laterality: Left;   SHOULDER SURGERY Right    SPINAL FUSION  03/04/2011   with ORIF   Patient Active Problem List   Diagnosis Date Noted   Generalized anxiety disorder 11/21/2022   Alcohol abuse with alcohol-induced mood disorder (HCC) 11/21/2022   Alcohol abuse with alcohol-induced anxiety disorder (HCC) 11/21/2022   Pulmonary edema 11/18/2022   Acute respiratory failure with hypoxia and hypercapnia (HCC) 11/17/2022   Acute on chronic diastolic (congestive) heart failure (HCC) 11/17/2022   Arthritis of left shoulder region    S/P reverse total shoulder arthroplasty, 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   Vitamin D deficiency 01/15/2021   Vitamin B12 deficiency 01/15/2021   Primary insomnia 01/15/2021   Morbid obesity (HCC) 01/15/2021   Moderate recurrent major depression (HCC) 01/15/2021   Benzodiazepine dependence (HCC) 01/15/2021   Port-A-Cath in place 07/22/2020   Left leg cellulitis 04/17/2020   Cellulitis of left leg 04/17/2020   Hypokalemia 04/17/2020   Macrocytic anemia 04/17/2020   Anxiety 04/17/2020   Depression 04/17/2020   Chronic diarrhea 04/17/2020   Chemotherapy induced diarrhea 04/17/2020  Malignant neoplasm of upper-inner quadrant of right breast in female, estrogen receptor positive (HCC) 12/26/2019   Encephalopathy 02/02/2018   Hypothyroidism 02/02/2018   AKI (acute kidney injury) (HCC) 02/02/2018   Chronic back pain 02/02/2018   Alcohol abuse 02/02/2018   Weight gain 02/02/2018   Benign essential HTN 02/02/2018    PCP: Rebecka Apley, NP   REFERRING PROVIDER: Merryl Hacker, NP   REFERRING DIAG: M48.00 (ICD-10-CM) - Spinal stenosis, site unspecified  Rationale for Evaluation and Treatment:  Rehabilitation  THERAPY DIAG:  Other low back pain  Muscle weakness (generalized)  Difficulty in walking, not elsewhere classified  ONSET DATE: Chronic back pain for many years but worse over last 6 months.   SUBJECTIVE:                                                                                                                                                                                           SUBJECTIVE STATEMENT: She reports the pain is not bad overall today PERTINENT HISTORY:  Lt rTSA 2022, anxiety, breast cancer with lumpectomy and radioactive seed placement 2021 and recent port removal, chronic pain, concussion 2008, HTN, depression, spinal fusion 2012, alcohol abuse  PAIN:  Are you having pain? Yes: NPRS scale: 3/10 Pain location: left side low back Pain description: sharp, ache Aggravating factors: has not been able to pinpoint this, sometimes she just wakes up with pain Relieving factors: sitting  PRECAUTIONS: Fall  WEIGHT BEARING RESTRICTIONS: No  FALLS:  Has patient fallen in last 6 months? Yes. Number of falls 1, fell in hall and broke her wrist. Relays several near misses.    PLOF: Independent with basic ADLs  PATIENT GOALS: reduce pain and improve balance.   NEXT MD VISIT: nothing scheduled  OBJECTIVE:   DIAGNOSTIC FINDINGS:  Lumbar CT 04/08/22  IMPRESSION: 1. No acute osseous abnormality. 2. Adjacent segment disease at L3-4 with mild spinal stenosis and moderate left neural foraminal stenosis, progressed from 2019. 3. Solid L4-5 fusion. 4. 2 cm left adrenal mass with indeterminate noncontrast CT characteristics but likely benign given 2 year size stability.  PATIENT SURVEYS:  Eval: FOTO 38% functional intake, goal is 45%  SCREENING FOR RED FLAGS: Bowel or bladder incontinence: No  COGNITION: Overall cognitive status: Within functional limits for tasks assessed      POSTURE: slumped posture   LUMBAR ROM:   AROM eval 08/08/23   Flexion 50% WNL  Extension 25%* 25%  Right lateral flexion 50%*   Left lateral flexion 50%*   Right rotation 50%* 75%  Left rotation 50% 75%   (Blank rows = not tested) *denotes pain  LOWER EXTREMITY MMT:    MMT tested in sitting Right eval Left eval Right 08/08/23 Left 08/08/23  Hip flexion 4 4 4+ 4+  Hip extension      Hip abduction 4 4 4+ 4+  Hip adduction      Hip internal rotation      Hip external rotation      Knee flexion 4+ 4+ 4+ 5  Knee extension 4+ 4+ 4+ 5  Ankle dorsiflexion 5 5    Ankle plantarflexion 5 5    Ankle inversion      Ankle eversion       (Blank rows = not tested)  LUMBAR SPECIAL TESTS:  Direction preference for lumbar flexion vs extensoin  FUNCTIONAL TESTS:         GAIT: eval Comments: unsteady gait, uses quad cane for assistance  TODAY'S TREATMENT:                                                                                                                              DATE:08/16/23 Nu step L6X 15 min UE/LE Seated lumbar flexion stretch p ball roll outs 10 sec X 10 Seated pball core isometric shoulder extension 5 sec X 10 Seated pball core isometric hip flexion 5 sec X 10 Sit to stand X 10 without UE support from standard chair, no UE support with cues and demo to lean forward more before standing Step ups forward on 6 inch step X 10 bilat with one UE support Step ups lateral on 6 inch step X 10 bilat with one UE support   Balance: Walking backwards in bars 3 round trips no UE support Walking sideways in bars 3 round trips no UE support Walking through 5 cones X 4 Walking backwards 25 feet X 2    PATIENT EDUCATION:  Education details: HEP, PT plan of care Person educated: Patient Education method: Explanation, Demonstration, Verbal cues, and Handouts Education comprehension: verbalized understanding and needs further education  HOME EXERCISE PROGRAM: Access Code: [email or web address] URL:  [email or web address] Date: 06/08/2023 Prepared by: Ivery Quale  Exercises - Supine Lower Trunk Rotation  - 2 x daily - 6 x weekly - 1 sets - 10 reps - 5 sec hold - Supine Single Knee to Chest Stretch  - 2 x daily - 6 x weekly - 1 sets - 2 reps - 30 sec hold - Supine Double Knee to Chest  - 2 x daily - 6 x weekly - 1 sets - 3 reps - 30 sec hold - Standing 'L' Stretch at Counter  - 2 x daily - 6 x weekly - 1 sets - 10 reps - 10 sec hold - Standing Tandem Balance with Counter Support  - 2 x daily - 6 x weekly - 1 sets - 3 reps - 30 sec hold - Walking with Head Rotation  - 2 x daily - 6 x weekly - 3 reps - Walking with Head Nod  - 2 x  daily - 6 x weekly - 3 reps - Feet Together Balance at [employer] Eyes Closed  - 2 x daily - 6 x weekly - 1 sets - 3 reps - 3- sec hold  ASSESSMENT:  CLINICAL IMPRESSION: Her pain appears to be a little better managed over the last 2 weeks so we have been able to progress her functional strength and balance program with good overall tolerance by her.   OBJECTIVE IMPAIRMENTS: decreased activity tolerance, decreased balance, decreased coordination, decreased endurance, difficulty walking, decreased ROM, decreased strength, improper body mechanics, postural dysfunction, obesity, and pain.   ACTIVITY LIMITATIONS: carrying, lifting, bending, sitting, standing, squatting, sleeping, stairs, and transfers  PARTICIPATION LIMITATIONS: meal prep, cleaning, laundry, and community activity  PERSONAL FACTORS: Past/current experiences, Time since onset of injury/illness/exacerbation, and 3+ comorbidities: Lt rTSA 2022, anxiety, breast cancer with lumpectomy and radioactive seed placement 2021 and recent port removal, chronic pain, concussion 2008, HTN, depression, spinal fusion 2012, alcohol abuse  are also affecting patient's functional outcome.   REHAB POTENTIAL: Good  CLINICAL DECISION MAKING: Evolving/moderate complexity  EVALUATION COMPLEXITY:  Moderate    GOALS: Short term PT Goals Target date: 07/06/2023   Pt will be I and compliant with HEP. Baseline:  Goal status: MET 07/21/23 Pt will decrease pain by 25% overall Baseline: Goal status: MET 07/21/23  Long term PT goals Target date:10/31/2023   Pt will improve lumbar ROM to [facility] to improve functional mobility Baseline: Goal status: partially met, only missing extension 08/08/23 Pt will improve  hip/knee strength to at least 4+/5 MMT to improve functional strength Baseline: Goal status: MET 08/08/23 Pt will improve FOTO to at least 45% functional to show improved function Baseline: Goal status: ongoing 07/21/23 Pt will reduce back pain by overall 50% overall with usual activity Baseline: Goal status: ongoing 08/08/23 Pt will improve BERG balance test to 45 points to show improved balance. Baseline: Goal status: ongoing improved to 43 on 08/08/23  PLAN: PT FREQUENCY: 1-2 times per week   PT DURATION: 6-12 weeks  PLANNED INTERVENTIONS (unless contraindicated): aquatic PT, Canalith repositioning, cryotherapy, Electrical stimulation, Iontophoresis with 4 mg/ml dexamethasome, Moist heat, traction, Ultrasound, gait training, Therapeutic exercise, balance training, neuromuscular re-education, patient/family education, prosthetic training, manual techniques, passive ROM, dry needling, taping, vasopnuematic device, vestibular, spinal manipulations, joint manipulations  PLAN FOR NEXT SESSION: flexion based stretching program, general strength and endurance, balance.    April Manson, PT,DPT 08/16/2023, 11:16 AM  Referring diagnosis? M48.00 (ICD-10-CM) - Spinal stenosis, site unspecified Treatment diagnosis? (if different than referring diagnosis) M54.59 What was this (referring dx) caused by? []  Surgery [x]  Fall [x]  Ongoing issue [x]  Arthritis []  Other: ____________  Laterality: []  Rt []  Lt [x]  Both  Check all possible CPT codes:  *CHOOSE 10 OR LESS*    [x]  97110  (Therapeutic Exercise)  []  92507 (SLP Treatment)  [x]  97112 (Neuro Re-ed)   []  92526 (Swallowing Treatment)   [x]  97116 (Gait Training)   []  [id number] (Cognitive Training, 1st 15 minutes) [x]  97140 (Manual Therapy)   []  97130 (Cognitive Training, each add'l 15 minutes)  []  97164 (Re-evaluation)                              []  Other, List CPT Code ____________  [x]  16109 (Therapeutic Activities)     []  97535 (Self Care)   []  All codes above (97110 - 97535)  [x]  97012 (Mechanical Traction)  [x]  97014 (E-stim Unattended)  []   96295 (E-stim manual)  []  97033 (Ionto)  []  [phone] (Ultrasound) []  97750 (Physical Performance Training) [x]  [phone] (Aquatic Therapy) []  97016 (Vasopneumatic Device) []  [id number] (Paraffin) []  97034 (Contrast Bath) []  [phone] (Wound Care 1st 20 sq cm) []  36644 (Wound Care each add'l 20 sq cm) []  97760 (Orthotic Fabrication, Fitting, Training Initial) []  [id number] (Prosthetic Management and Training Initial) []  [id number] (Orthotic or Prosthetic Training/ Modification Subsequent)

## 2023-08-17 ENCOUNTER — Encounter: Payer: Self-pay | Admitting: Hematology and Oncology

## 2023-08-18 ENCOUNTER — Encounter: Payer: Self-pay | Admitting: Physical Therapy

## 2023-08-18 ENCOUNTER — Ambulatory Visit: Payer: Medicare PPO | Admitting: Physical Therapy

## 2023-08-18 DIAGNOSIS — M6281 Muscle weakness (generalized): Secondary | ICD-10-CM

## 2023-08-18 DIAGNOSIS — M5459 Other low back pain: Secondary | ICD-10-CM | POA: Diagnosis not present

## 2023-08-18 DIAGNOSIS — M25561 Pain in right knee: Secondary | ICD-10-CM

## 2023-08-18 DIAGNOSIS — R262 Difficulty in walking, not elsewhere classified: Secondary | ICD-10-CM | POA: Diagnosis not present

## 2023-08-18 NOTE — Therapy (Signed)
OUTPATIENT PHYSICAL THERAPY TREATMENT  Patient Name: [name] MRN: [id number] DOB:09/15/1951, 72 y.o., female Today's Date: 08/18/2023  END OF SESSION:  PT End of Session - 08/18/23 1448     Visit Number 13    Number of Visits 20    Date for PT Re-Evaluation 10/31/23    Authorization Type [employer]    PT Start Time 1430    PT Stop Time 1510    PT Time Calculation (min) 40 min    Activity Tolerance Patient tolerated treatment well    Behavior During Therapy WFL for tasks assessed/performed               Past Medical History:  Diagnosis Date   Anxiety    Atrial fibrillation [facility])    sees Dr. Flora Lipps   Bilateral swelling of feet    Cancer [facility])    breast   Chronic diarrhea    Chronic pain    Concussion 02/2007   ICU x 3 days   Depression    Hypertension    Hypothyroidism    Personal history of chemotherapy    Personal history of radiation therapy    Spinal stenosis    Thyroid disease ?1994   Vitamin D deficiency    Past Surgical History:  Procedure Laterality Date   BREAST BIOPSY Right 12/20/2019   x2   BREAST LUMPECTOMY Right 01/22/2020   BREAST LUMPECTOMY WITH RADIOACTIVE SEED AND SENTINEL LYMPH NODE BIOPSY Right 01/22/2020   Procedure: RIGHT BREAST LUMPECTOMY WITH RADIOACTIVE SEED X2 AND RIGHT SENTINEL LYMPH NODE MAPPING;  Surgeon: Harriette Bouillon, MD;  Location: [facility];  Service: General;  Laterality: Right;   BREAST SURGERY Right 03/1998   breast biopsy, benign   EYE SURGERY Right    PORT-A-CATH REMOVAL Right 04/30/2021   Procedure: REMOVAL PORT-A-CATH;  Surgeon: Harriette Bouillon, MD;  Location: [facility];  Service: General;  Laterality: Right;   PORTACATH PLACEMENT Right 01/22/2020   Procedure: INSERTION PORT-A-CATH WITH ULTRASOUND;  Surgeon: Harriette Bouillon, MD;  Location: [facility];  Service: General;  Laterality: Right;   PORTACATH PLACEMENT Right 02/28/2020   Procedure: PORT A CATH REVISION;   Surgeon: Harriette Bouillon, MD;  Location: [facility] OR;  Service: General;  Laterality: Right;   REVERSE SHOULDER ARTHROPLASTY Left 02/02/2021   Procedure: LEFT REVERSE SHOULDER ARTHROPLASTY;  Surgeon: Cammy Copa, MD;  Location: [facility] OR;  Service: Orthopedics;  Laterality: Left;   SHOULDER SURGERY Right    SPINAL FUSION  03/04/2011   with ORIF   Patient Active Problem List   Diagnosis Date Noted   Generalized anxiety disorder 11/21/2022   Alcohol abuse with alcohol-induced mood disorder (HCC) 11/21/2022   Alcohol abuse with alcohol-induced anxiety disorder (HCC) 11/21/2022   Pulmonary edema 11/18/2022   Acute respiratory failure with hypoxia and hypercapnia (HCC) 11/17/2022   Acute on chronic diastolic (congestive) heart failure (HCC) 11/17/2022   Arthritis of left shoulder region    S/P reverse total shoulder arthroplasty, 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   Vitamin D deficiency 01/15/2021   Vitamin B12 deficiency 01/15/2021   Primary insomnia 01/15/2021   Morbid obesity (HCC) 01/15/2021   Moderate recurrent major depression (HCC) 01/15/2021   Benzodiazepine dependence (HCC) 01/15/2021   Port-A-Cath in place 07/22/2020   Left leg cellulitis 04/17/2020   Cellulitis of left leg 04/17/2020   Hypokalemia 04/17/2020   Macrocytic anemia 04/17/2020   Anxiety 04/17/2020   Depression 04/17/2020   Chronic diarrhea 04/17/2020   Chemotherapy induced diarrhea 04/17/2020  Malignant neoplasm of upper-inner quadrant of right breast in female, estrogen receptor positive (HCC) 12/26/2019   Encephalopathy 02/02/2018   Hypothyroidism 02/02/2018   AKI (acute kidney injury) (HCC) 02/02/2018   Chronic back pain 02/02/2018   Alcohol abuse 02/02/2018   Weight gain 02/02/2018   Benign essential HTN 02/02/2018    PCP: Rebecka Apley, NP   REFERRING PROVIDER: Merryl Hacker, NP   REFERRING DIAG: M48.00 (ICD-10-CM) - Spinal stenosis, site unspecified  Rationale for Evaluation and Treatment:  Rehabilitation  THERAPY DIAG:  Other low back pain  Muscle weakness (generalized)  Difficulty in walking, not elsewhere classified  Acute pain of right knee  ONSET DATE: Chronic back pain for many years but worse over last 6 months.   SUBJECTIVE:                                                                                                                                                                                           SUBJECTIVE STATEMENT: She reports she feels low on energy today. Pain levels still doing okay, some pain but not bad  PERTINENT HISTORY:  Lt rTSA 2022, anxiety, breast cancer with lumpectomy and radioactive seed placement 2021 and recent port removal, chronic pain, concussion 2008, HTN, depression, spinal fusion 2012, alcohol abuse  PAIN:  Are you having pain? Yes: NPRS scale: 3/10 Pain location: left side low back Pain description: sharp, ache Aggravating factors: has not been able to pinpoint this, sometimes she just wakes up with pain Relieving factors: sitting  PRECAUTIONS: Fall  WEIGHT BEARING RESTRICTIONS: No  FALLS:  Has patient fallen in last 6 months? Yes. Number of falls 1, fell in hall and broke her wrist. Relays several near misses.    PLOF: Independent with basic ADLs  PATIENT GOALS: reduce pain and improve balance.   NEXT MD VISIT: nothing scheduled  OBJECTIVE:   DIAGNOSTIC FINDINGS:  Lumbar CT 04/08/22  IMPRESSION: 1. No acute osseous abnormality. 2. Adjacent segment disease at L3-4 with mild spinal stenosis and moderate left neural foraminal stenosis, progressed from 2019. 3. Solid L4-5 fusion. 4. 2 cm left adrenal mass with indeterminate noncontrast CT characteristics but likely benign given 2 year size stability.  PATIENT SURVEYS:  Eval: FOTO 38% functional intake, goal is 45%  SCREENING FOR RED FLAGS: Bowel or bladder incontinence: No  COGNITION: Overall cognitive status: Within functional limits for tasks  assessed      POSTURE: slumped posture   LUMBAR ROM:   AROM eval 08/08/23  Flexion 50% WNL  Extension 25%* 25%  Right lateral flexion 50%*   Left lateral flexion 50%*   Right rotation 50%* 75%  Left rotation 50% 75%   (Blank rows = not tested) *denotes pain   LOWER EXTREMITY MMT:    MMT tested in sitting Right eval Left eval Right 08/08/23 Left 08/08/23  Hip flexion 4 4 4+ 4+  Hip extension      Hip abduction 4 4 4+ 4+  Hip adduction      Hip internal rotation      Hip external rotation      Knee flexion 4+ 4+ 4+ 5  Knee extension 4+ 4+ 4+ 5  Ankle dorsiflexion 5 5    Ankle plantarflexion 5 5    Ankle inversion      Ankle eversion       (Blank rows = not tested)  LUMBAR SPECIAL TESTS:  Direction preference for lumbar flexion vs extensoin  FUNCTIONAL TESTS:         GAIT: eval Comments: unsteady gait, uses quad cane for assistance  TODAY'S TREATMENT:                                                                                                                              DATE:08/18/23 Nu step L6X 15 min UE/LE Seated lumbar flexion stretch p ball roll outs 10 sec X 10 Seated pball core isometric shoulder extension 5 sec X 10 Seated pball core isometric hip flexion 5 sec X 10 Sit to stand X 10 without UE support from standard chair with airex pad, no UE support with cues and demo to lean forward more before standing Step ups forward on 6 inch step X 10 bilat with one UE support Step ups lateral on 6 inch step X 10 bilat with one UE support   Balance: Walking backwards in bars 3 round trips no UE support Walking sideways in bars 3 round trips no UE support Walking with headturns 3 round trips without UE support Walking with head nods 3 round trips without UE support  DATE:08/16/23 Nu step L6X 15 min UE/LE Seated lumbar flexion stretch p ball roll outs 10 sec X 10 Seated pball core isometric shoulder extension 5 sec X 10 Seated pball core isometric  hip flexion 5 sec X 10 Sit to stand X 10 without UE support from standard chair, no UE support with cues and demo to lean forward more before standing Step ups forward on 6 inch step X 10 bilat with one UE support Step ups lateral on 6 inch step X 10 bilat with one UE support   Balance: Walking backwards in bars 3 round trips no UE support Walking sideways in bars 3 round trips no UE support Walking through 5 cones X 4 Walking backwards 25 feet X 2    PATIENT EDUCATION:  Education details: HEP, PT plan of care Person educated: Patient Education method: Explanation, Demonstration, Verbal cues, and Handouts Education comprehension: verbalized understanding and needs further education  HOME EXERCISE PROGRAM: Access Code: [email or web address] URL: [email or web address] Date: 06/08/2023 Prepared by: Ivery Quale  Exercises -  Supine Lower Trunk Rotation  - 2 x daily - 6 x weekly - 1 sets - 10 reps - 5 sec hold - Supine Single Knee to Chest Stretch  - 2 x daily - 6 x weekly - 1 sets - 2 reps - 30 sec hold - Supine Double Knee to Chest  - 2 x daily - 6 x weekly - 1 sets - 3 reps - 30 sec hold - Standing 'L' Stretch at [employer]  - 2 x daily - 6 x weekly - 1 sets - 10 reps - 10 sec hold - Standing Tandem Balance with Counter Support  - 2 x daily - 6 x weekly - 1 sets - 3 reps - 30 sec hold - Walking with Head Rotation  - 2 x daily - 6 x weekly - 3 reps - Walking with Head Nod  - 2 x daily - 6 x weekly - 3 reps - Feet Together Balance at [employer]  - 2 x daily - 6 x weekly - 1 sets - 3 reps - 3- sec hold  ASSESSMENT:  CLINICAL IMPRESSION: She is still doing well with pain but she was having some fatigue and weakness today causing her to be more unsteady on her feet.    OBJECTIVE IMPAIRMENTS: decreased activity tolerance, decreased balance, decreased coordination, decreased endurance, difficulty walking, decreased ROM, decreased strength, improper body mechanics, postural  dysfunction, obesity, and pain.   ACTIVITY LIMITATIONS: carrying, lifting, bending, sitting, standing, squatting, sleeping, stairs, and transfers  PARTICIPATION LIMITATIONS: meal prep, cleaning, laundry, and community activity  PERSONAL FACTORS: Past/current experiences, Time since onset of injury/illness/exacerbation, and 3+ comorbidities: Lt rTSA 2022, anxiety, breast cancer with lumpectomy and radioactive seed placement 2021 and recent port removal, chronic pain, concussion 2008, HTN, depression, spinal fusion 2012, alcohol abuse  are also affecting patient's functional outcome.   REHAB POTENTIAL: Good  CLINICAL DECISION MAKING: Evolving/moderate complexity  EVALUATION COMPLEXITY: Moderate    GOALS: Short term PT Goals Target date: 07/06/2023   Pt will be I and compliant with HEP. Baseline:  Goal status: MET 07/21/23 Pt will decrease pain by 25% overall Baseline: Goal status: MET 07/21/23  Long term PT goals Target date:10/31/2023   Pt will improve lumbar ROM to [facility] to improve functional mobility Baseline: Goal status: partially met, only missing extension 08/08/23 Pt will improve  hip/knee strength to at least 4+/5 MMT to improve functional strength Baseline: Goal status: MET 08/08/23 Pt will improve FOTO to at least 45% functional to show improved function Baseline: Goal status: ongoing 07/21/23 Pt will reduce back pain by overall 50% overall with usual activity Baseline: Goal status: ongoing 08/08/23 Pt will improve BERG balance test to 45 points to show improved balance. Baseline: Goal status: ongoing improved to 43 on 08/08/23  PLAN: PT FREQUENCY: 1-2 times per week   PT DURATION: 6-12 weeks  PLANNED INTERVENTIONS (unless contraindicated): aquatic PT, Canalith repositioning, cryotherapy, Electrical stimulation, Iontophoresis with 4 mg/ml dexamethasome, Moist heat, traction, Ultrasound, gait training, Therapeutic exercise, balance training, neuromuscular re-education,  patient/family education, prosthetic training, manual techniques, passive ROM, dry needling, taping, vasopnuematic device, vestibular, spinal manipulations, joint manipulations  PLAN FOR NEXT SESSION: flexion based stretching program, general strength and endurance, balance.    April Manson, PT,DPT 08/18/2023, 2:48 PM  Referring diagnosis? M48.00 (ICD-10-CM) - Spinal stenosis, site unspecified Treatment diagnosis? (if different than referring diagnosis) M54.59 What was this (referring dx) caused by? []  Surgery [x]  Fall [x]  Ongoing issue [x]  Arthritis []  Other:  ____________  Laterality: []  Rt []  Lt [x]  Both  Check all possible CPT codes:  *CHOOSE 10 OR LESS*    [x]  97110 (Therapeutic Exercise)  []  92507 (SLP Treatment)  [x]  97112 (Neuro Re-ed)   []  92526 (Swallowing Treatment)   [x]  97116 (Gait Training)   []  95621 (Cognitive Training, 1st 15 minutes) [x]  97140 (Manual Therapy)   []  97130 (Cognitive Training, each add'l 15 minutes)  []  97164 (Re-evaluation)                              []  Other, List CPT Code ____________  [x]  97530 (Therapeutic Activities)     []  97535 (Self Care)   []  All codes above (97110 - 97535)  [x]  97012 (Mechanical Traction)  [x]  97014 (E-stim Unattended)  []  97032 (E-stim manual)  []  97033 (Ionto)  []  97035 (Ultrasound) []  97750 (Physical Performance Training) [x]  [id number] (Aquatic Therapy) []  97016 (Vasopneumatic Device) []  [id number] (Paraffin) []  97034 (Contrast Bath) []  97597 (Wound Care 1st 20 sq cm) []  97598 (Wound Care each add'l 20 sq cm) []  97760 (Orthotic Fabrication, Fitting, Training Initial) []  [id number] (Prosthetic Management and Training Initial) []  [id number] (Orthotic or Prosthetic Training/ Modification Subsequent)

## 2023-08-19 ENCOUNTER — Other Ambulatory Visit: Payer: Self-pay | Admitting: Hematology and Oncology

## 2023-08-19 ENCOUNTER — Encounter: Payer: Self-pay | Admitting: Hematology and Oncology

## 2023-08-19 ENCOUNTER — Ambulatory Visit
Admission: RE | Admit: 2023-08-19 | Discharge: 2023-08-19 | Disposition: A | Discharge status: Home / Self Care | Payer: Medicare PPO | Source: Ambulatory Visit | Attending: Hematology and Oncology | Admitting: Hematology and Oncology

## 2023-08-19 DIAGNOSIS — Z853 Personal history of malignant neoplasm of breast: Secondary | ICD-10-CM

## 2023-08-23 ENCOUNTER — Other Ambulatory Visit: Payer: Self-pay | Admitting: Hematology and Oncology

## 2023-08-23 ENCOUNTER — Encounter: Payer: Medicare PPO | Admitting: Physical Therapy

## 2023-08-23 DIAGNOSIS — N6489 Other specified disorders of breast: Secondary | ICD-10-CM

## 2023-08-25 ENCOUNTER — Encounter: Payer: Self-pay | Admitting: Physical Therapy

## 2023-08-25 ENCOUNTER — Ambulatory Visit: Payer: Medicare PPO | Admitting: Physical Therapy

## 2023-08-25 DIAGNOSIS — M25561 Pain in right knee: Secondary | ICD-10-CM | POA: Diagnosis not present

## 2023-08-25 DIAGNOSIS — M6281 Muscle weakness (generalized): Secondary | ICD-10-CM | POA: Diagnosis not present

## 2023-08-25 DIAGNOSIS — R262 Difficulty in walking, not elsewhere classified: Secondary | ICD-10-CM | POA: Diagnosis not present

## 2023-08-25 DIAGNOSIS — M5459 Other low back pain: Secondary | ICD-10-CM | POA: Diagnosis not present

## 2023-08-25 NOTE — Therapy (Signed)
OUTPATIENT PHYSICAL THERAPY TREATMENT  Patient Name: [name] MRN: [id number] DOB:1951/12/07, 72 y.o., female Today's Date: 08/25/2023  END OF SESSION:  PT End of Session - 08/25/23 1129     Visit Number 14    Number of Visits 20    Date for PT Re-Evaluation 10/31/23    Authorization Type [employer] Authorization #[id number]  Tracking #[id number] 8/19-11/4    PT Start Time 1430    PT Stop Time 1515    PT Time Calculation (min) 45 min    Activity Tolerance Patient tolerated treatment well    Behavior During Therapy [facility] for tasks assessed/performed               Past Medical History:  Diagnosis Date   Anxiety    Atrial fibrillation [facility])    sees Dr. Flora Lipps   Bilateral swelling of feet    Cancer (HCC)    breast   Chronic diarrhea    Chronic pain    Concussion 02/2007   ICU x 3 days   Depression    Hypertension    Hypothyroidism    Personal history of chemotherapy    Personal history of radiation therapy    Spinal stenosis    Thyroid disease ?1994   Vitamin D deficiency    Past Surgical History:  Procedure Laterality Date   BREAST BIOPSY Right 12/20/2019   x2   BREAST LUMPECTOMY Right 01/22/2020   BREAST LUMPECTOMY WITH RADIOACTIVE SEED AND SENTINEL LYMPH NODE BIOPSY Right 01/22/2020   Procedure: RIGHT BREAST LUMPECTOMY WITH RADIOACTIVE SEED X2 AND RIGHT SENTINEL LYMPH NODE MAPPING;  Surgeon: Harriette Bouillon, MD;  Location: [facility];  Service: General;  Laterality: Right;   BREAST SURGERY Right 03/1998   breast biopsy, benign   EYE SURGERY Right    PORT-A-CATH REMOVAL Right 04/30/2021   Procedure: REMOVAL PORT-A-CATH;  Surgeon: Harriette Bouillon, MD;  Location: [facility];  Service: General;  Laterality: Right;   PORTACATH PLACEMENT Right 01/22/2020   Procedure: INSERTION PORT-A-CATH WITH ULTRASOUND;  Surgeon: Harriette Bouillon, MD;  Location: [facility];  Service: General;  Laterality: Right;   PORTACATH PLACEMENT  Right 02/28/2020   Procedure: PORT A CATH REVISION;  Surgeon: Harriette Bouillon, MD;  Location: [facility];  Service: General;  Laterality: Right;   REVERSE SHOULDER ARTHROPLASTY Left 02/02/2021   Procedure: LEFT REVERSE SHOULDER ARTHROPLASTY;  Surgeon: Cammy Copa, MD;  Location: [facility] OR;  Service: Orthopedics;  Laterality: Left;   SHOULDER SURGERY Right    SPINAL FUSION  03/04/2011   with ORIF   Patient Active Problem List   Diagnosis Date Noted   Generalized anxiety disorder 11/21/2022   Alcohol abuse with alcohol-induced mood disorder (HCC) 11/21/2022   Alcohol abuse with alcohol-induced anxiety disorder (HCC) 11/21/2022   Pulmonary edema 11/18/2022   Acute respiratory failure with hypoxia and hypercapnia (HCC) 11/17/2022   Acute on chronic diastolic (congestive) heart failure (HCC) 11/17/2022   Arthritis of left shoulder region    S/P reverse total shoulder arthroplasty, 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   Vitamin D deficiency 01/15/2021   Vitamin B12 deficiency 01/15/2021   Primary insomnia 01/15/2021   Morbid obesity (HCC) 01/15/2021   Moderate recurrent major depression (HCC) 01/15/2021   Benzodiazepine dependence (HCC) 01/15/2021   Port-A-Cath in place 07/22/2020   Left leg cellulitis 04/17/2020   Cellulitis of left leg 04/17/2020   Hypokalemia 04/17/2020   Macrocytic anemia 04/17/2020   Anxiety 04/17/2020   Depression 04/17/2020   Chronic diarrhea 04/17/2020  Chemotherapy induced diarrhea 04/17/2020   Malignant neoplasm of upper-inner quadrant of right breast in female, estrogen receptor positive (HCC) 12/26/2019   Encephalopathy 02/02/2018   Hypothyroidism 02/02/2018   AKI (acute kidney injury) (HCC) 02/02/2018   Chronic back pain 02/02/2018   Alcohol abuse 02/02/2018   Weight gain 02/02/2018   Benign essential HTN 02/02/2018    PCP: Rebecka Apley, NP   REFERRING PROVIDER: Merryl Hacker, NP   REFERRING DIAG: M48.00 (ICD-10-CM) - Spinal stenosis, site  unspecified  Rationale for Evaluation and Treatment: Rehabilitation  THERAPY DIAG:  Other low back pain  Muscle weakness (generalized)  Difficulty in walking, not elsewhere classified  Acute pain of right knee  ONSET DATE: Chronic back pain for many years but worse over last 6 months.   SUBJECTIVE:                                                                                                                                                                                           SUBJECTIVE STATEMENT: She reports doing good today, she only has one more PT visit and would like to set up more, PT helped her with this today.  PERTINENT HISTORY:  Lt rTSA 2022, anxiety, breast cancer with lumpectomy and radioactive seed placement 2021 and recent port removal, chronic pain, concussion 2008, HTN, depression, spinal fusion 2012, alcohol abuse  PAIN:  Are you having pain? Yes: NPRS scale: 3/10 Pain location: left side low back Pain description: sharp, ache Aggravating factors: has not been able to pinpoint this, sometimes she just wakes up with pain Relieving factors: sitting  PRECAUTIONS: Fall  WEIGHT BEARING RESTRICTIONS: No  FALLS:  Has patient fallen in last 6 months? Yes. Number of falls 1, fell in hall and broke her wrist. Relays several near misses.    PLOF: Independent with basic ADLs  PATIENT GOALS: reduce pain and improve balance.   NEXT MD VISIT: nothing scheduled  OBJECTIVE:   DIAGNOSTIC FINDINGS:  Lumbar CT 04/08/22  IMPRESSION: 1. No acute osseous abnormality. 2. Adjacent segment disease at L3-4 with mild spinal stenosis and moderate left neural foraminal stenosis, progressed from 2019. 3. Solid L4-5 fusion. 4. 2 cm left adrenal mass with indeterminate noncontrast CT characteristics but likely benign given 2 year size stability.  PATIENT SURVEYS:  Eval: FOTO 38% functional intake, goal is 45%  SCREENING FOR RED FLAGS: Bowel or bladder incontinence:  No  COGNITION: Overall cognitive status: Within functional limits for tasks assessed      POSTURE: slumped posture   LUMBAR ROM:   AROM eval 08/08/23  Flexion 50% WNL  Extension 25%* 25%  Right lateral flexion  50%*   Left lateral flexion 50%*   Right rotation 50%* 75%  Left rotation 50% 75%   (Blank rows = not tested) *denotes pain   LOWER EXTREMITY MMT:    MMT tested in sitting Right eval Left eval Right 08/08/23 Left 08/08/23  Hip flexion 4 4 4+ 4+  Hip extension      Hip abduction 4 4 4+ 4+  Hip adduction      Hip internal rotation      Hip external rotation      Knee flexion 4+ 4+ 4+ 5  Knee extension 4+ 4+ 4+ 5  Ankle dorsiflexion 5 5    Ankle plantarflexion 5 5    Ankle inversion      Ankle eversion       (Blank rows = not tested)  LUMBAR SPECIAL TESTS:  Direction preference for lumbar flexion vs extensoin  FUNCTIONAL TESTS:         GAIT: eval Comments: unsteady gait, uses quad cane for assistance  TODAY'S TREATMENT:                                                                                                                              DATE:08/25/23 Nu step L6X 15 min UE/LE for functional endurance Seated lumbar flexion stretch p ball roll outs 10 sec X 10 Seated pball core isometric shoulder extension 5 sec X 10 Seated pball core isometric hip flexion 5 sec X 10 Sit to stand X 10 without UE support from standard chair with airex pad, no UE support with cues and demo to lean forward more before standing Step ups forward on 6 inch step X 15 bilat with one UE support Step ups lateral on 6 inch step X 15 bilat with one UE support   Balance: Walking backwards in bars 3 round trips no UE support Walking sideways in bars 3 round trips no UE support Walking with headturns 3 round trips without UE support Walking with head nods 3 round trips without UE support  DATE:08/18/23 Nu step L6X 15 min UE/LE Seated lumbar flexion stretch p ball roll  outs 10 sec X 10 Seated pball core isometric shoulder extension 5 sec X 10 Seated pball core isometric hip flexion 5 sec X 10 Sit to stand X 10 without UE support from standard chair with airex pad, no UE support with cues and demo to lean forward more before standing Step ups forward on 6 inch step X 10 bilat with one UE support Step ups lateral on 6 inch step X 10 bilat with one UE support   Balance: Walking backwards in bars 3 round trips no UE support Walking sideways in bars 3 round trips no UE support Walking with headturns 3 round trips without UE support Walking with head nods 3 round trips without UE support  DATE:08/16/23 Nu step L6X 15 min UE/LE Seated lumbar flexion stretch p ball roll outs 10 sec X 10 Seated pball  core isometric shoulder extension 5 sec X 10 Seated pball core isometric hip flexion 5 sec X 10 Sit to stand X 10 without UE support from standard chair, no UE support with cues and demo to lean forward more before standing Step ups forward on 6 inch step X 10 bilat with one UE support Step ups lateral on 6 inch step X 10 bilat with one UE support   Balance: Walking backwards in bars 3 round trips no UE support Walking sideways in bars 3 round trips no UE support Walking through 5 cones X 4 Walking backwards 25 feet X 2    PATIENT EDUCATION:  Education details: HEP, PT plan of care Person educated: Patient Education method: Explanation, Demonstration, Verbal cues, and Handouts Education comprehension: verbalized understanding and needs further education  HOME EXERCISE PROGRAM: Access Code: [email or web address] URL: [email or web address] Date: 06/08/2023 Prepared by: Ivery Quale  Exercises - Supine Lower Trunk Rotation  - 2 x daily - 6 x weekly - 1 sets - 10 reps - 5 sec hold - Supine Single Knee to Chest Stretch  - 2 x daily - 6 x weekly - 1 sets - 2 reps - 30 sec hold - Supine Double Knee to Chest  - 2 x daily - 6 x weekly - 1 sets - 3 reps -  30 sec hold - Standing 'L' Stretch at Counter  - 2 x daily - 6 x weekly - 1 sets - 10 reps - 10 sec hold - Standing Tandem Balance with Counter Support  - 2 x daily - 6 x weekly - 1 sets - 3 reps - 30 sec hold - Walking with Head Rotation  - 2 x daily - 6 x weekly - 3 reps - Walking with Head Nod  - 2 x daily - 6 x weekly - 3 reps - Feet Together Balance at [employer] Eyes Closed  - 2 x daily - 6 x weekly - 1 sets - 3 reps - 3- sec hold  ASSESSMENT:  CLINICAL IMPRESSION: Pain levels doing good so again progressed her functional strengthening program, core strength, and balance program with good overall tolerance noted.   OBJECTIVE IMPAIRMENTS: decreased activity tolerance, decreased balance, decreased coordination, decreased endurance, difficulty walking, decreased ROM, decreased strength, improper body mechanics, postural dysfunction, obesity, and pain.   ACTIVITY LIMITATIONS: carrying, lifting, bending, sitting, standing, squatting, sleeping, stairs, and transfers  PARTICIPATION LIMITATIONS: meal prep, cleaning, laundry, and community activity  PERSONAL FACTORS: Past/current experiences, Time since onset of injury/illness/exacerbation, and 3+ comorbidities: Lt rTSA 2022, anxiety, breast cancer with lumpectomy and radioactive seed placement 2021 and recent port removal, chronic pain, concussion 2008, HTN, depression, spinal fusion 2012, alcohol abuse  are also affecting patient's functional outcome.   REHAB POTENTIAL: Good  CLINICAL DECISION MAKING: Evolving/moderate complexity  EVALUATION COMPLEXITY: Moderate    GOALS: Short term PT Goals Target date: 07/06/2023   Pt will be I and compliant with HEP. Baseline:  Goal status: MET 07/21/23 Pt will decrease pain by 25% overall Baseline: Goal status: MET 07/21/23  Long term PT goals Target date:10/31/2023   Pt will improve lumbar ROM to [facility] to improve functional mobility Baseline: Goal status: partially met, only missing extension  08/08/23 Pt will improve  hip/knee strength to at least 4+/5 MMT to improve functional strength Baseline: Goal status: MET 08/08/23 Pt will improve FOTO to at least 45% functional to show improved function Baseline: Goal status: ongoing 07/21/23 Pt will reduce back pain by overall 50% overall with  usual activity Baseline: Goal status: ongoing 08/08/23 Pt will improve BERG balance test to 45 points to show improved balance. Baseline: Goal status: ongoing improved to 43 on 08/08/23  PLAN: PT FREQUENCY: 1-2 times per week   PT DURATION: 6-12 weeks  PLANNED INTERVENTIONS (unless contraindicated): aquatic PT, Canalith repositioning, cryotherapy, Electrical stimulation, Iontophoresis with 4 mg/ml dexamethasome, Moist heat, traction, Ultrasound, gait training, Therapeutic exercise, balance training, neuromuscular re-education, patient/family education, prosthetic training, manual techniques, passive ROM, dry needling, taping, vasopnuematic device, vestibular, spinal manipulations, joint manipulations  PLAN FOR NEXT SESSION: flexion based stretching program, general strength and endurance, balance.    April Manson, PT,DPT 08/25/2023, 1:56 PM  Referring diagnosis? M48.00 (ICD-10-CM) - Spinal stenosis, site unspecified Treatment diagnosis? (if different than referring diagnosis) M54.59 What was this (referring dx) caused by? []  Surgery [x]  Fall [x]  Ongoing issue [x]  Arthritis []  Other: ____________  Laterality: []  Rt []  Lt [x]  Both  Check all possible CPT codes:  *CHOOSE 10 OR LESS*    [x]  97110 (Therapeutic Exercise)  []  92507 (SLP Treatment)  [x]  97112 (Neuro Re-ed)   []  92526 (Swallowing Treatment)   [x]  97116 (Gait Training)   []  [id number] (Cognitive Training, 1st 15 minutes) [x]  97140 (Manual Therapy)   []  97130 (Cognitive Training, each add'l 15 minutes)  []  97164 (Re-evaluation)                              []  Other, List CPT Code ____________  [x]  97530 (Therapeutic  Activities)     []  97535 (Self Care)   []  All codes above (97110 - 97535)  [x]  97012 (Mechanical Traction)  [x]  97014 (E-stim Unattended)  []  97032 (E-stim manual)  []  97033 (Ionto)  []  91478 (Ultrasound) []  97750 (Physical Performance Training) [x]  [id number] (Aquatic Therapy) []  97016 (Vasopneumatic Device) []  [id number] (Paraffin) []  97034 (Contrast Bath) []  97597 (Wound Care 1st 20 sq cm) []  97598 (Wound Care each add'l 20 sq cm) []  97760 (Orthotic Fabrication, Fitting, Training Initial) []  [id number] (Prosthetic Management and Training Initial) []  [id number] (Orthotic or Prosthetic Training/ Modification Subsequent)

## 2023-08-31 ENCOUNTER — Encounter: Payer: Medicare PPO | Admitting: Physical Therapy

## 2023-09-01 ENCOUNTER — Ambulatory Visit
Admission: RE | Admit: 2023-09-01 | Discharge: 2023-09-01 | Disposition: A | Discharge status: Home / Self Care | Payer: Medicare PPO | Source: Ambulatory Visit | Attending: Hematology and Oncology | Admitting: Hematology and Oncology

## 2023-09-01 DIAGNOSIS — N6489 Other specified disorders of breast: Secondary | ICD-10-CM

## 2023-09-06 ENCOUNTER — Encounter: Payer: Medicare PPO | Admitting: Physical Therapy

## 2023-09-08 ENCOUNTER — Encounter: Payer: Medicare PPO | Admitting: Physical Therapy

## 2023-09-13 ENCOUNTER — Encounter: Payer: Medicare PPO | Admitting: Physical Therapy

## 2023-09-15 ENCOUNTER — Ambulatory Visit: Payer: Medicare PPO | Admitting: Physical Therapy

## 2023-09-15 DIAGNOSIS — R262 Difficulty in walking, not elsewhere classified: Secondary | ICD-10-CM | POA: Diagnosis not present

## 2023-09-15 DIAGNOSIS — M6281 Muscle weakness (generalized): Secondary | ICD-10-CM

## 2023-09-15 DIAGNOSIS — M5459 Other low back pain: Secondary | ICD-10-CM

## 2023-09-15 NOTE — Therapy (Signed)
OUTPATIENT PHYSICAL THERAPY TREATMENT  Patient Name: [name] MRN: [id number] DOB:May 26, 1951, 72 y.o., female Today's Date: 09/15/2023  END OF SESSION:  PT End of Session - 09/15/23 1024     Visit Number 15    Number of Visits 20    Date for PT Re-Evaluation 10/31/23    Authorization Type [employer] Authorization #[id number]  Tracking #[id number] 8/19-11/4    PT Start Time 1005    PT Stop Time 1045    PT Time Calculation (min) 40 min    Activity Tolerance Patient tolerated treatment well    Behavior During Therapy [facility] for tasks assessed/performed               Past Medical History:  Diagnosis Date   Anxiety    Atrial fibrillation [facility])    sees Dr. Flora Lipps   Bilateral swelling of feet    Cancer (HCC)    breast   Chronic diarrhea    Chronic pain    Concussion 02/2007   ICU x 3 days   Depression    Hypertension    Hypothyroidism    Personal history of chemotherapy    Personal history of radiation therapy    Spinal stenosis    Thyroid disease ?1994   Vitamin D deficiency    Past Surgical History:  Procedure Laterality Date   BREAST BIOPSY Right 12/20/2019   x2   BREAST LUMPECTOMY Right 01/22/2020   BREAST LUMPECTOMY WITH RADIOACTIVE SEED AND SENTINEL LYMPH NODE BIOPSY Right 01/22/2020   Procedure: RIGHT BREAST LUMPECTOMY WITH RADIOACTIVE SEED X2 AND RIGHT SENTINEL LYMPH NODE MAPPING;  Surgeon: Harriette Bouillon, MD;  Location: [facility];  Service: General;  Laterality: Right;   BREAST SURGERY Right 03/1998   breast biopsy, benign   EYE SURGERY Right    PORT-A-CATH REMOVAL Right 04/30/2021   Procedure: REMOVAL PORT-A-CATH;  Surgeon: Harriette Bouillon, MD;  Location: [facility];  Service: General;  Laterality: Right;   PORTACATH PLACEMENT Right 01/22/2020   Procedure: INSERTION PORT-A-CATH WITH ULTRASOUND;  Surgeon: Harriette Bouillon, MD;  Location: [facility];  Service: General;  Laterality: Right;   PORTACATH PLACEMENT  Right 02/28/2020   Procedure: PORT A CATH REVISION;  Surgeon: Harriette Bouillon, MD;  Location: [facility] OR;  Service: General;  Laterality: Right;   REVERSE SHOULDER ARTHROPLASTY Left 02/02/2021   Procedure: LEFT REVERSE SHOULDER ARTHROPLASTY;  Surgeon: Cammy Copa, MD;  Location: [facility] OR;  Service: Orthopedics;  Laterality: Left;   SHOULDER SURGERY Right    SPINAL FUSION  03/04/2011   with ORIF   Patient Active Problem List   Diagnosis Date Noted   Generalized anxiety disorder 11/21/2022   Alcohol abuse with alcohol-induced mood disorder (HCC) 11/21/2022   Alcohol abuse with alcohol-induced anxiety disorder (HCC) 11/21/2022   Pulmonary edema 11/18/2022   Acute respiratory failure with hypoxia and hypercapnia (HCC) 11/17/2022   Acute on chronic diastolic (congestive) heart failure (HCC) 11/17/2022   Arthritis of left shoulder region    S/P reverse total shoulder arthroplasty, 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   Vitamin D deficiency 01/15/2021   Vitamin B12 deficiency 01/15/2021   Primary insomnia 01/15/2021   Morbid obesity (HCC) 01/15/2021   Moderate recurrent major depression (HCC) 01/15/2021   Benzodiazepine dependence (HCC) 01/15/2021   Port-A-Cath in place 07/22/2020   Left leg cellulitis 04/17/2020   Cellulitis of left leg 04/17/2020   Hypokalemia 04/17/2020   Macrocytic anemia 04/17/2020   Anxiety 04/17/2020   Depression 04/17/2020   Chronic diarrhea 04/17/2020  Chemotherapy induced diarrhea 04/17/2020   Malignant neoplasm of upper-inner quadrant of right breast in female, estrogen receptor positive (HCC) 12/26/2019   Encephalopathy 02/02/2018   Hypothyroidism 02/02/2018   AKI (acute kidney injury) (HCC) 02/02/2018   Chronic back pain 02/02/2018   Alcohol abuse 02/02/2018   Weight gain 02/02/2018   Benign essential HTN 02/02/2018    PCP: Rebecka Apley, NP   REFERRING PROVIDER: Merryl Hacker, NP   REFERRING DIAG: M48.00 (ICD-10-CM) - Spinal stenosis, site  unspecified  Rationale for Evaluation and Treatment: Rehabilitation  THERAPY DIAG:  Other low back pain  Muscle weakness (generalized)  Difficulty in walking, not elsewhere classified  ONSET DATE: Chronic back pain for many years but worse over last 6 months.   SUBJECTIVE:                                                                                                                                                                                           SUBJECTIVE STATEMENT: She says doing much better today.   PERTINENT HISTORY:  Lt rTSA 2022, anxiety, breast cancer with lumpectomy and radioactive seed placement 2021 and recent port removal, chronic pain, concussion 2008, HTN, depression, spinal fusion 2012, alcohol abuse  PAIN:  Are you having pain? Yes: NPRS scale: 3/10 Pain location: left side low back Pain description: sharp, ache Aggravating factors: has not been able to pinpoint this, sometimes she just wakes up with pain Relieving factors: sitting  PRECAUTIONS: Fall  WEIGHT BEARING RESTRICTIONS: No  FALLS:  Has patient fallen in last 6 months? Yes. Number of falls 1, fell in hall and broke her wrist. Relays several near misses.    PLOF: Independent with basic ADLs  PATIENT GOALS: reduce pain and improve balance.   NEXT MD VISIT: nothing scheduled  OBJECTIVE:   DIAGNOSTIC FINDINGS:  Lumbar CT 04/08/22  IMPRESSION: 1. No acute osseous abnormality. 2. Adjacent segment disease at L3-4 with mild spinal stenosis and moderate left neural foraminal stenosis, progressed from 2019. 3. Solid L4-5 fusion. 4. 2 cm left adrenal mass with indeterminate noncontrast CT characteristics but likely benign given 2 year size stability.  PATIENT SURVEYS:  Eval: FOTO 38% functional intake, goal is 45%  SCREENING FOR RED FLAGS: Bowel or bladder incontinence: No  COGNITION: Overall cognitive status: Within functional limits for tasks assessed      POSTURE: slumped  posture   LUMBAR ROM:   AROM eval 08/08/23  Flexion 50% WNL  Extension 25%* 25%  Right lateral flexion 50%*   Left lateral flexion 50%*   Right rotation 50%* 75%  Left rotation 50% 75%   (Blank rows = not  tested) *denotes pain   LOWER EXTREMITY MMT:    MMT tested in sitting Right eval Left eval Right 08/08/23 Left 08/08/23  Hip flexion 4 4 4+ 4+  Hip extension      Hip abduction 4 4 4+ 4+  Hip adduction      Hip internal rotation      Hip external rotation      Knee flexion 4+ 4+ 4+ 5  Knee extension 4+ 4+ 4+ 5  Ankle dorsiflexion 5 5    Ankle plantarflexion 5 5    Ankle inversion      Ankle eversion       (Blank rows = not tested)  LUMBAR SPECIAL TESTS:  Direction preference for lumbar flexion vs extensoin  FUNCTIONAL TESTS:         GAIT: eval Comments: unsteady gait, uses quad cane for assistance  TODAY'S TREATMENT:                                                                                                                              DATE:09/15/23 Nu step L6X 15 min UE/LE for functional endurance Seated lumbar flexion stretch p ball roll outs 10 sec X 10 Seated pball core isometric shoulder extension 5 sec X 10 Seated pball core isometric hip flexion 5 sec X 10 Sit to stand X 10 without UE support from standard chair with airex pad, no UE support with cues and demo to lean forward more before standing Step ups forward on 6 inch step X 15 bilat with one UE support  Balance: Walking backwards in bars 3 round trips no UE support Walking sideways in bars 3 round trips no UE support Walking with headturns 3 round trips without UE support Walking with head nods 3 round trips without UE support  DATE:08/25/23 Nu step L6X 15 min UE/LE for functional endurance Seated lumbar flexion stretch p ball roll outs 10 sec X 10 Seated pball core isometric shoulder extension 5 sec X 10 Seated pball core isometric hip flexion 5 sec X 10 Sit to stand X 10 without UE  support from standard chair with airex pad, no UE support with cues and demo to lean forward more before standing Step ups forward on 6 inch step X 15 bilat with one UE support Step ups lateral on 6 inch step X 15 bilat with one UE support   Balance: Walking backwards in bars 3 round trips no UE support Walking sideways in bars 3 round trips no UE support Walking with headturns 3 round trips without UE support Walking with head nods 3 round trips without UE support  DATE:08/18/23 Nu step L6X 15 min UE/LE Seated lumbar flexion stretch p ball roll outs 10 sec X 10 Seated pball core isometric shoulder extension 5 sec X 10 Seated pball core isometric hip flexion 5 sec X 10 Sit to stand X 10 without UE support from standard chair with airex pad, no UE support with  cues and demo to lean forward more before standing Step ups forward on 6 inch step X 10 bilat with one UE support Step ups lateral on 6 inch step X 10 bilat with one UE support   Balance: Walking backwards in bars 3 round trips no UE support Walking sideways in bars 3 round trips no UE support Walking with headturns 3 round trips without UE support Walking with head nods 3 round trips without UE support    PATIENT EDUCATION:  Education details: HEP, PT plan of care Person educated: Patient Education method: Explanation, Demonstration, Verbal cues, and Handouts Education comprehension: verbalized understanding and needs further education  HOME EXERCISE PROGRAM: Access Code: [email or web address] URL: [email or web address] Date: 06/08/2023 Prepared by: Ivery Quale  Exercises - Supine Lower Trunk Rotation  - 2 x daily - 6 x weekly - 1 sets - 10 reps - 5 sec hold - Supine Single Knee to Chest Stretch  - 2 x daily - 6 x weekly - 1 sets - 2 reps - 30 sec hold - Supine Double Knee to Chest  - 2 x daily - 6 x weekly - 1 sets - 3 reps - 30 sec hold - Standing 'L' Stretch at Counter  - 2 x daily - 6 x weekly - 1 sets - 10  reps - 10 sec hold - Standing Tandem Balance with Counter Support  - 2 x daily - 6 x weekly - 1 sets - 3 reps - 30 sec hold - Walking with Head Rotation  - 2 x daily - 6 x weekly - 3 reps - Walking with Head Nod  - 2 x daily - 6 x weekly - 3 reps - Feet Together Balance at [employer] Eyes Closed  - 2 x daily - 6 x weekly - 1 sets - 3 reps - 3- sec hold  ASSESSMENT:  CLINICAL IMPRESSION: She has missed last 3 weeks of PT so she was a little tired and fatigued today. Did have one episode of LOB with min A from PT to regain balance. She will continue to benefit from skilled PT.   OBJECTIVE IMPAIRMENTS: decreased activity tolerance, decreased balance, decreased coordination, decreased endurance, difficulty walking, decreased ROM, decreased strength, improper body mechanics, postural dysfunction, obesity, and pain.   ACTIVITY LIMITATIONS: carrying, lifting, bending, sitting, standing, squatting, sleeping, stairs, and transfers  PARTICIPATION LIMITATIONS: meal prep, cleaning, laundry, and community activity  PERSONAL FACTORS: Past/current experiences, Time since onset of injury/illness/exacerbation, and 3+ comorbidities: Lt rTSA 2022, anxiety, breast cancer with lumpectomy and radioactive seed placement 2021 and recent port removal, chronic pain, concussion 2008, HTN, depression, spinal fusion 2012, alcohol abuse  are also affecting patient's functional outcome.   REHAB POTENTIAL: Good  CLINICAL DECISION MAKING: Evolving/moderate complexity  EVALUATION COMPLEXITY: Moderate    GOALS: Short term PT Goals Target date: 07/06/2023   Pt will be I and compliant with HEP. Baseline:  Goal status: MET 07/21/23 Pt will decrease pain by 25% overall Baseline: Goal status: MET 07/21/23  Long term PT goals Target date:10/31/2023   Pt will improve lumbar ROM to [facility] to improve functional mobility Baseline: Goal status: partially met, only missing extension 08/08/23 Pt will improve  hip/knee strength  to at least 4+/5 MMT to improve functional strength Baseline: Goal status: MET 08/08/23 Pt will improve FOTO to at least 45% functional to show improved function Baseline: Goal status: ongoing 07/21/23 Pt will reduce back pain by overall 50% overall with usual activity Baseline: Goal status: ongoing 08/08/23 Pt  will improve BERG balance test to 45 points to show improved balance. Baseline: Goal status: ongoing improved to 43 on 08/08/23  PLAN: PT FREQUENCY: 1-2 times per week   PT DURATION: 6-12 weeks  PLANNED INTERVENTIONS (unless contraindicated): aquatic PT, Canalith repositioning, cryotherapy, Electrical stimulation, Iontophoresis with 4 mg/ml dexamethasome, Moist heat, traction, Ultrasound, gait training, Therapeutic exercise, balance training, neuromuscular re-education, patient/family education, prosthetic training, manual techniques, passive ROM, dry needling, taping, vasopnuematic device, vestibular, spinal manipulations, joint manipulations  PLAN FOR NEXT SESSION: flexion based stretching program, general strength and endurance, balance.    April Manson, PT,DPT 09/15/2023, 10:26 AM  Referring diagnosis? M48.00 (ICD-10-CM) - Spinal stenosis, site unspecified Treatment diagnosis? (if different than referring diagnosis) M54.59 What was this (referring dx) caused by? []  Surgery [x]  Fall [x]  Ongoing issue [x]  Arthritis []  Other: ____________  Laterality: []  Rt []  Lt [x]  Both  Check all possible CPT codes:  *CHOOSE 10 OR LESS*    [x]  97110 (Therapeutic Exercise)  []  92507 (SLP Treatment)  [x]  97112 (Neuro Re-ed)   []  92526 (Swallowing Treatment)   [x]  97116 (Gait Training)   []  [id number] (Cognitive Training, 1st 15 minutes) [x]  97140 (Manual Therapy)   []  97130 (Cognitive Training, each add'l 15 minutes)  []  97164 (Re-evaluation)                              []  Other, List CPT Code ____________  [x]  97530 (Therapeutic Activities)     []  97535 (Self Care)   []  All codes  above (97110 - 97535)  [x]  97012 (Mechanical Traction)  [x]  97014 (E-stim Unattended)  []  97032 (E-stim manual)  []  97033 (Ionto)  []  97035 (Ultrasound) []  97750 (Physical Performance Training) [x]  [id number] (Aquatic Therapy) []  97016 (Vasopneumatic Device) []  [id number] (Paraffin) []  97034 (Contrast Bath) []  97597 (Wound Care 1st 20 sq cm) []  97598 (Wound Care each add'l 20 sq cm) []  97760 (Orthotic Fabrication, Fitting, Training Initial) []  52841 (Prosthetic Management and Training Initial) []  [id number] (Orthotic or Prosthetic Training/ Modification Subsequent)

## 2023-09-22 ENCOUNTER — Ambulatory Visit: Payer: Medicare PPO | Admitting: Physical Therapy

## 2023-09-22 ENCOUNTER — Encounter: Payer: Self-pay | Admitting: Physical Therapy

## 2023-09-22 DIAGNOSIS — M25561 Pain in right knee: Secondary | ICD-10-CM | POA: Diagnosis not present

## 2023-09-22 DIAGNOSIS — M5459 Other low back pain: Secondary | ICD-10-CM

## 2023-09-22 DIAGNOSIS — M6281 Muscle weakness (generalized): Secondary | ICD-10-CM

## 2023-09-22 DIAGNOSIS — R262 Difficulty in walking, not elsewhere classified: Secondary | ICD-10-CM

## 2023-09-22 NOTE — Therapy (Addendum)
OUTPATIENT PHYSICAL THERAPY TREATMENT/Discharge PHYSICAL THERAPY DISCHARGE SUMMARY  Visits from Start of Care: 16  Current functional level related to goals / functional outcomes: See below   Remaining deficits: See below   Education / Equipment: HEP  Plan:  Patient goals were some met. Patient is being discharged due to her request due to being sick. Ivery Quale, PT, DPT 10/07/23 9:11 AM      Patient Name: KYDEN ELWELL MRN: [id number] DOB:[birth year], 72 y.o., female Today's Date: 09/22/2023  END OF SESSION:  PT End of Session - 09/22/23 1527     Visit Number 16    Number of Visits 20    Date for PT Re-Evaluation 10/31/23    Authorization Type [employer] Authorization #[id number]  Tracking #[id number] 8/19-11/4    PT Start Time 1515    PT Stop Time 1553    PT Time Calculation (min) 38 min    Activity Tolerance Patient tolerated treatment well    Behavior During Therapy [facility] for tasks assessed/performed               Past Medical History:  Diagnosis Date   Anxiety    Atrial fibrillation [facility])    sees Dr. Flora Lipps   Bilateral swelling of feet    Cancer (HCC)    breast   Chronic diarrhea    Chronic pain    Concussion 02/2007   ICU x 3 days   Depression    Hypertension    Hypothyroidism    Personal history of chemotherapy    Personal history of radiation therapy    Spinal stenosis    Thyroid disease ?1994   Vitamin D deficiency    Past Surgical History:  Procedure Laterality Date   BREAST BIOPSY Right 12/20/2019   x2   BREAST LUMPECTOMY Right 01/22/2020   BREAST LUMPECTOMY WITH RADIOACTIVE SEED AND SENTINEL LYMPH NODE BIOPSY Right 01/22/2020   Procedure: RIGHT BREAST LUMPECTOMY WITH RADIOACTIVE SEED X2 AND RIGHT SENTINEL LYMPH NODE MAPPING;  Surgeon: Harriette Bouillon, MD;  Location: [facility];  Service: General;  Laterality: Right;   BREAST SURGERY Right 03/1998   breast biopsy, benign   EYE SURGERY Right    PORT-A-CATH REMOVAL Right  04/30/2021   Procedure: REMOVAL PORT-A-CATH;  Surgeon: Harriette Bouillon, MD;  Location: [facility];  Service: General;  Laterality: Right;   PORTACATH PLACEMENT Right 01/22/2020   Procedure: INSERTION PORT-A-CATH WITH ULTRASOUND;  Surgeon: Harriette Bouillon, MD;  Location: [facility];  Service: General;  Laterality: Right;   PORTACATH PLACEMENT Right 02/28/2020   Procedure: PORT A CATH REVISION;  Surgeon: Harriette Bouillon, MD;  Location: [facility];  Service: General;  Laterality: Right;   REVERSE SHOULDER ARTHROPLASTY Left 02/02/2021   Procedure: LEFT REVERSE SHOULDER ARTHROPLASTY;  Surgeon: Cammy Copa, MD;  Location: [facility] OR;  Service: Orthopedics;  Laterality: Left;   SHOULDER SURGERY Right    SPINAL FUSION  03/04/2011   with ORIF   Patient Active Problem List   Diagnosis Date Noted   Generalized anxiety disorder 11/21/2022   Alcohol abuse with alcohol-induced mood disorder (HCC) 11/21/2022   Alcohol abuse with alcohol-induced anxiety disorder (HCC) 11/21/2022   Pulmonary edema 11/18/2022   Acute respiratory failure with hypoxia and hypercapnia (HCC) 11/17/2022   Acute on chronic diastolic (congestive) heart failure (HCC) 11/17/2022   Arthritis of left shoulder region    S/P reverse total shoulder arthroplasty, 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   Vitamin D deficiency 01/15/2021   Vitamin B12 deficiency 01/15/2021  Primary insomnia 01/15/2021   Morbid obesity (HCC) 01/15/2021   Moderate recurrent major depression (HCC) 01/15/2021   Benzodiazepine dependence (HCC) 01/15/2021   Port-A-Cath in place 07/22/2020   Left leg cellulitis 04/17/2020   Cellulitis of left leg 04/17/2020   Hypokalemia 04/17/2020   Macrocytic anemia 04/17/2020   Anxiety 04/17/2020   Depression 04/17/2020   Chronic diarrhea 04/17/2020   Chemotherapy induced diarrhea 04/17/2020   Malignant neoplasm of upper-inner quadrant of right breast in female, estrogen receptor positive (HCC) 12/26/2019    Encephalopathy 02/02/2018   Hypothyroidism 02/02/2018   AKI (acute kidney injury) (HCC) 02/02/2018   Chronic back pain 02/02/2018   Alcohol abuse 02/02/2018   Weight gain 02/02/2018   Benign essential HTN 02/02/2018    PCP: Rebecka Apley, NP   REFERRING PROVIDER: Merryl Hacker, NP   REFERRING DIAG: M48.00 (ICD-10-CM) - Spinal stenosis, site unspecified  Rationale for Evaluation and Treatment: Rehabilitation  THERAPY DIAG:  Other low back pain  Muscle weakness (generalized)  Difficulty in walking, not elsewhere classified  Acute pain of right knee  ONSET DATE: Chronic back pain for many years but worse over last 6 months.   SUBJECTIVE:                                                                                                                                                                                           SUBJECTIVE STATEMENT: She says 2 days ago she bent over to pick something up and felt pain in her mid back. She is still having pain from this and plans to see doctor about this.  PERTINENT HISTORY:  Lt rTSA 2022, anxiety, breast cancer with lumpectomy and radioactive seed placement 2021 and recent port removal, chronic pain, concussion 2008, HTN, depression, spinal fusion 2012, alcohol abuse  PAIN:  Are you having pain? Yes: NPRS scale: 3/10 Pain location: left side low back Pain description: sharp, ache Aggravating factors: has not been able to pinpoint this, sometimes she just wakes up with pain Relieving factors: sitting  PRECAUTIONS: Fall  WEIGHT BEARING RESTRICTIONS: No  FALLS:  Has patient fallen in last 6 months? Yes. Number of falls 1, fell in hall and broke her wrist. Relays several near misses.    PLOF: Independent with basic ADLs  PATIENT GOALS: reduce pain and improve balance.   NEXT MD VISIT: nothing scheduled  OBJECTIVE:   DIAGNOSTIC FINDINGS:  Lumbar CT 04/08/22  IMPRESSION: 1. No acute osseous abnormality. 2.  Adjacent segment disease at L3-4 with mild spinal stenosis and moderate left neural foraminal stenosis, progressed from 2019. 3. Solid L4-5 fusion. 4. 2 cm left adrenal mass with  indeterminate noncontrast CT characteristics but likely benign given 2 year size stability.  PATIENT SURVEYS:  Eval: FOTO 38% functional intake, goal is 45%  SCREENING FOR RED FLAGS: Bowel or bladder incontinence: No  COGNITION: Overall cognitive status: Within functional limits for tasks assessed      POSTURE: slumped posture   LUMBAR ROM:   AROM eval 08/08/23  Flexion 50% WNL  Extension 25%* 25%  Right lateral flexion 50%*   Left lateral flexion 50%*   Right rotation 50%* 75%  Left rotation 50% 75%   (Blank rows = not tested) *denotes pain   LOWER EXTREMITY MMT:    MMT tested in sitting Right eval Left eval Right 08/08/23 Left 08/08/23  Hip flexion 4 4 4+ 4+  Hip extension      Hip abduction 4 4 4+ 4+  Hip adduction      Hip internal rotation      Hip external rotation      Knee flexion 4+ 4+ 4+ 5  Knee extension 4+ 4+ 4+ 5  Ankle dorsiflexion 5 5    Ankle plantarflexion 5 5    Ankle inversion      Ankle eversion       (Blank rows = not tested)  LUMBAR SPECIAL TESTS:  Direction preference for lumbar flexion vs extensoin  FUNCTIONAL TESTS:         GAIT: eval Comments: unsteady gait, uses quad cane for assistance  TODAY'S TREATMENT:                                                                                                                              DATE:09/22/23 Nu step L6X 15 min UE/LE for functional endurance Seated lumbar flexion stretch p ball roll outs 10 sec X 10 Seated pball core isometric shoulder extension 5 sec X 10 Seated pball core isometric hip flexion 5 sec X 10 Standing ros Standing rows green X 15 Standing chest press green X 15  Balance: Walking backwards in bars 3 round trips no UE support Walking sideways in bars 3 round trips no UE  support Walking with headturns 3 round trips without UE support Walking with head nods 3 round trips without UE support  DATE:09/15/23 Nu step L6X 15 min UE/LE for functional endurance Seated lumbar flexion stretch p ball roll outs 10 sec X 10 Seated pball core isometric shoulder extension 5 sec X 10 Seated pball core isometric hip flexion 5 sec X 10 Sit to stand X 10 without UE support from standard chair with airex pad, no UE support with cues and demo to lean forward more before standing Step ups forward on 6 inch step X 15 bilat with one UE support  Balance: Walking backwards in bars 3 round trips no UE support Walking sideways in bars 3 round trips no UE support Walking with headturns 3 round trips without UE support Walking with head nods 3 round trips without UE support  DATE:08/25/23 Nu  step L6X 15 min UE/LE for functional endurance Seated lumbar flexion stretch p ball roll outs 10 sec X 10 Seated pball core isometric shoulder extension 5 sec X 10 Seated pball core isometric hip flexion 5 sec X 10 Sit to stand X 10 without UE support from standard chair with airex pad, no UE support with cues and demo to lean forward more before standing Step ups forward on 6 inch step X 15 bilat with one UE support Step ups lateral on 6 inch step X 15 bilat with one UE support   Balance: Walking backwards in bars 3 round trips no UE support Walking sideways in bars 3 round trips no UE support Walking with headturns 3 round trips without UE support Walking with head nods 3 round trips without UE support   PATIENT EDUCATION:  Education details: HEP, PT plan of care Person educated: Patient Education method: Explanation, Demonstration, Verbal cues, and Handouts Education comprehension: verbalized understanding and needs further education  HOME EXERCISE PROGRAM: Access Code: [email or web address] URL: [email or web address] Date: 06/08/2023 Prepared by: Ivery Quale  Exercises -  Supine Lower Trunk Rotation  - 2 x daily - 6 x weekly - 1 sets - 10 reps - 5 sec hold - Supine Single Knee to Chest Stretch  - 2 x daily - 6 x weekly - 1 sets - 2 reps - 30 sec hold - Supine Double Knee to Chest  - 2 x daily - 6 x weekly - 1 sets - 3 reps - 30 sec hold - Standing 'L' Stretch at Counter  - 2 x daily - 6 x weekly - 1 sets - 10 reps - 10 sec hold - Standing Tandem Balance with Counter Support  - 2 x daily - 6 x weekly - 1 sets - 3 reps - 30 sec hold - Walking with Head Rotation  - 2 x daily - 6 x weekly - 3 reps - Walking with Head Nod  - 2 x daily - 6 x weekly - 3 reps - Feet Together Balance at [employer] Eyes Closed  - 2 x daily - 6 x weekly - 1 sets - 3 reps - 3- sec hold  ASSESSMENT:  CLINICAL IMPRESSION: She was somewhat limited with her activity today by back pain today after reported injury bending over to pick up object 2 days ago. She plans to see MD about this.   OBJECTIVE IMPAIRMENTS: decreased activity tolerance, decreased balance, decreased coordination, decreased endurance, difficulty walking, decreased ROM, decreased strength, improper body mechanics, postural dysfunction, obesity, and pain.   ACTIVITY LIMITATIONS: carrying, lifting, bending, sitting, standing, squatting, sleeping, stairs, and transfers  PARTICIPATION LIMITATIONS: meal prep, cleaning, laundry, and community activity  PERSONAL FACTORS: Past/current experiences, Time since onset of injury/illness/exacerbation, and 3+ comorbidities: Lt rTSA 2022, anxiety, breast cancer with lumpectomy and radioactive seed placement 2021 and recent port removal, chronic pain, concussion 2008, HTN, depression, spinal fusion 2012, alcohol abuse  are also affecting patient's functional outcome.   REHAB POTENTIAL: Good  CLINICAL DECISION MAKING: Evolving/moderate complexity  EVALUATION COMPLEXITY: Moderate    GOALS: Short term PT Goals Target date: 07/06/2023   Pt will be I and compliant with HEP. Baseline:   Goal status: MET 07/21/23 Pt will decrease pain by 25% overall Baseline: Goal status: MET 07/21/23  Long term PT goals Target date:10/31/2023   Pt will improve lumbar ROM to [facility] to improve functional mobility Baseline: Goal status: partially met, only missing extension 08/08/23 Pt will improve  hip/knee strength to  at least 4+/5 MMT to improve functional strength Baseline: Goal status: MET 08/08/23 Pt will improve FOTO to at least 45% functional to show improved function Baseline: Goal status: ongoing 07/21/23 Pt will reduce back pain by overall 50% overall with usual activity Baseline: Goal status: ongoing 08/08/23 Pt will improve BERG balance test to 45 points to show improved balance. Baseline: Goal status: ongoing improved to 43 on 08/08/23  PLAN: PT FREQUENCY: 1-2 times per week   PT DURATION: 6-12 weeks  PLANNED INTERVENTIONS (unless contraindicated): aquatic PT, Canalith repositioning, cryotherapy, Electrical stimulation, Iontophoresis with 4 mg/ml dexamethasome, Moist heat, traction, Ultrasound, gait training, Therapeutic exercise, balance training, neuromuscular re-education, patient/family education, prosthetic training, manual techniques, passive ROM, dry needling, taping, vasopnuematic device, vestibular, spinal manipulations, joint manipulations  PLAN FOR NEXT SESSION: flexion based stretching program, general strength and endurance, balance.    April Manson, PT,DPT 09/22/2023, 4:09 PM  Referring diagnosis? M48.00 (ICD-10-CM) - Spinal stenosis, site unspecified Treatment diagnosis? (if different than referring diagnosis) M54.59 What was this (referring dx) caused by? []  Surgery [x]  Fall [x]  Ongoing issue [x]  Arthritis []  Other: ____________  Laterality: []  Rt []  Lt [x]  Both  Check all possible CPT codes:  *CHOOSE 10 OR LESS*    [x]  97110 (Therapeutic Exercise)  []  92507 (SLP Treatment)  [x]  97112 (Neuro Re-ed)   []  92526 (Swallowing Treatment)   [x]  97116  (Gait Training)   []  [phone] (Cognitive Training, 1st 15 minutes) [x]  97140 (Manual Therapy)   []  97130 (Cognitive Training, each add'l 15 minutes)  []  97164 (Re-evaluation)                              []  Other, List CPT Code ____________  [x]  97530 (Therapeutic Activities)     []  97535 (Self Care)   []  All codes above (97110 - 97535)  [x]  97012 (Mechanical Traction)  [x]  97014 (E-stim Unattended)  []  97032 (E-stim manual)  []  97033 (Ionto)  []  97035 (Ultrasound) []  97750 (Physical Performance Training) [x]  [id number] (Aquatic Therapy) []  97016 (Vasopneumatic Device) []  [id number] (Paraffin) []  46962 (Contrast Bath) []  97597 (Wound Care 1st 20 sq cm) []  97598 (Wound Care each add'l 20 sq cm) []  97760 (Orthotic Fabrication, Fitting, Training Initial) []  [id number] (Prosthetic Management and Training Initial) []  [id number] (Orthotic or Prosthetic Training/ Modification Subsequent)

## 2023-09-27 ENCOUNTER — Encounter: Payer: Medicare PPO | Admitting: Physical Therapy

## 2023-09-29 ENCOUNTER — Encounter: Payer: Medicare PPO | Admitting: Physical Therapy

## 2023-10-04 ENCOUNTER — Encounter: Payer: Medicare PPO | Admitting: Physical Therapy

## 2023-10-06 ENCOUNTER — Encounter: Payer: Medicare PPO | Admitting: Physical Therapy

## 2023-10-20 ENCOUNTER — Telehealth: Payer: Self-pay

## 2023-10-20 NOTE — Telephone Encounter (Signed)
Pre-operative Risk Assessment    Patient Name: CLEOTA DONAWAY  DOB: 1951/11/10 MRN: [id number]  Last ov:07/12/2023 Upcoming visit: Unknown       Request for Surgical Clearance    Procedure:   Inpatient thoracic spine fusion   Date of Surgery:  Clearance TBD                                 Surgeon:  Helyn App. Mayford Knife, MD    Surgeon's Group or Practice Name:  [facility]  Phone number:  [phone] Fax number:  [phone]   Type of Clearance Requested:   - Medical  - Pharmacy:  Hold Apixaban (Eliquis) Stop 7 days pre-op and can resume 5 days post-op    Type of Anesthesia:  Not Indicated   Additional requests/questions:    Vance Peper   10/20/2023, 2:55 PM

## 2023-10-21 NOTE — Telephone Encounter (Signed)
Please advise holding Eliquis prior to Thoracic spine fusion.  Request is for hold 7 days prior and resume 5 days following procedure.   Thank you!  DW

## 2023-10-22 NOTE — Telephone Encounter (Signed)
Patient with diagnosis of afib on Eliquis for anticoagulation.    Procedure: Inpatient thoracic spine fusion  Date of procedure: TBD   CHA2DS2-VASc Score = 4   This indicates a 4.8% annual risk of stroke. The patient's score is based upon: CHF History: 1 HTN History: 1 Diabetes History: 0 Stroke History: 0 Vascular Disease History: 0 Age Score: 1 Gender Score: 1      CrCl 79 ml/min Platelet count 254  Per office protocol, patient can hold Eliquis for 3 days prior to procedure.    Pharmacokinetically there is no reason to hold Eliquis more than 3 days prior to procedure. If surgeon still wants a 7 day hold, then MD approval will be needed.  **This guidance is not considered finalized until pre-operative APP has relayed final recommendations.**

## 2023-10-24 NOTE — Telephone Encounter (Signed)
   Name: [name]  DOB: 04-Dec-1951  MRN: [id number]  Primary Cardiologist: Reatha Harps, MD   Preoperative team, please contact this patient and set up a phone call appointment for further preoperative risk assessment. Please obtain consent and complete medication review. Thank you for your help.  I confirm that guidance regarding antiplatelet and oral anticoagulation therapy has been completed and, if necessary, noted below.  Per Pharm D, Per office protocol, patient can hold Eliquis for 3 days prior to procedure.    Pharmacokinetically there is no reason to hold Eliquis more than 3 days prior to procedure. If surgeon still wants a 7 day hold, then MD approval will be needed. Per Dr. Flora Lipps "Agree with 3 days. If they want to do 7 that is fine too."  I also confirmed the patient resides in the state of [place]. As per [facility] Medical Board telemedicine laws, the patient must reside in the state in which the provider is licensed.   Carlos Levering, NP 10/24/2023, 10:54 AM [facility]

## 2023-10-24 NOTE — Telephone Encounter (Signed)
1st attempt to reach pt to schedule tele visit. No answer. Could not leave message due to vm being full

## 2023-10-27 ENCOUNTER — Inpatient Hospital Stay (HOSPITAL_COMMUNITY)
Admission: EM | Admit: 2023-10-27 | Discharge: 2023-11-20 | DRG: 085 | Disposition: E | Payer: Medicare PPO | Attending: General Surgery | Admitting: General Surgery

## 2023-10-27 ENCOUNTER — Inpatient Hospital Stay (HOSPITAL_COMMUNITY): Payer: Medicare PPO

## 2023-10-27 ENCOUNTER — Emergency Department (HOSPITAL_COMMUNITY): Payer: Medicare PPO

## 2023-10-27 ENCOUNTER — Other Ambulatory Visit: Payer: Self-pay

## 2023-10-27 DIAGNOSIS — E512 Wernicke's encephalopathy: Secondary | ICD-10-CM | POA: Diagnosis present

## 2023-10-27 DIAGNOSIS — E66812 Obesity, class 2: Secondary | ICD-10-CM | POA: Diagnosis present

## 2023-10-27 DIAGNOSIS — I609 Nontraumatic subarachnoid hemorrhage, unspecified: Secondary | ICD-10-CM

## 2023-10-27 DIAGNOSIS — F1020 Alcohol dependence, uncomplicated: Secondary | ICD-10-CM | POA: Diagnosis not present

## 2023-10-27 DIAGNOSIS — J9602 Acute respiratory failure with hypercapnia: Secondary | ICD-10-CM | POA: Diagnosis present

## 2023-10-27 DIAGNOSIS — Z888 Allergy status to other drugs, medicaments and biological substances status: Secondary | ICD-10-CM

## 2023-10-27 DIAGNOSIS — S065XAA Traumatic subdural hemorrhage with loss of consciousness status unknown, initial encounter: Secondary | ICD-10-CM | POA: Diagnosis not present

## 2023-10-27 DIAGNOSIS — I10 Essential (primary) hypertension: Secondary | ICD-10-CM | POA: Diagnosis present

## 2023-10-27 DIAGNOSIS — E039 Hypothyroidism, unspecified: Secondary | ICD-10-CM | POA: Diagnosis present

## 2023-10-27 DIAGNOSIS — F430 Acute stress reaction: Secondary | ICD-10-CM | POA: Diagnosis present

## 2023-10-27 DIAGNOSIS — S22052S Unstable burst fracture of T5-T6 vertebra, sequela: Secondary | ICD-10-CM | POA: Diagnosis not present

## 2023-10-27 DIAGNOSIS — F411 Generalized anxiety disorder: Secondary | ICD-10-CM | POA: Diagnosis not present

## 2023-10-27 DIAGNOSIS — Z9911 Dependence on respirator [ventilator] status: Secondary | ICD-10-CM | POA: Diagnosis not present

## 2023-10-27 DIAGNOSIS — J9601 Acute respiratory failure with hypoxia: Secondary | ICD-10-CM | POA: Diagnosis present

## 2023-10-27 DIAGNOSIS — F32A Depression, unspecified: Secondary | ICD-10-CM | POA: Diagnosis present

## 2023-10-27 DIAGNOSIS — Z981 Arthrodesis status: Secondary | ICD-10-CM

## 2023-10-27 DIAGNOSIS — Z6372 Alcoholism and drug addiction in family: Secondary | ICD-10-CM

## 2023-10-27 DIAGNOSIS — S2239XA Fracture of one rib, unspecified side, initial encounter for closed fracture: Secondary | ICD-10-CM

## 2023-10-27 DIAGNOSIS — R296 Repeated falls: Secondary | ICD-10-CM | POA: Diagnosis present

## 2023-10-27 DIAGNOSIS — G9520 Unspecified cord compression: Secondary | ICD-10-CM | POA: Diagnosis present

## 2023-10-27 DIAGNOSIS — M810 Age-related osteoporosis without current pathological fracture: Secondary | ICD-10-CM | POA: Diagnosis present

## 2023-10-27 DIAGNOSIS — F10131 Alcohol abuse with withdrawal delirium: Secondary | ICD-10-CM | POA: Diagnosis present

## 2023-10-27 DIAGNOSIS — Z833 Family history of diabetes mellitus: Secondary | ICD-10-CM

## 2023-10-27 DIAGNOSIS — Z66 Do not resuscitate: Secondary | ICD-10-CM | POA: Diagnosis not present

## 2023-10-27 DIAGNOSIS — S2231XA Fracture of one rib, right side, initial encounter for closed fracture: Secondary | ICD-10-CM | POA: Diagnosis present

## 2023-10-27 DIAGNOSIS — Z811 Family history of alcohol abuse and dependence: Secondary | ICD-10-CM

## 2023-10-27 DIAGNOSIS — Z79899 Other long term (current) drug therapy: Secondary | ICD-10-CM

## 2023-10-27 DIAGNOSIS — Z96612 Presence of left artificial shoulder joint: Secondary | ICD-10-CM | POA: Diagnosis present

## 2023-10-27 DIAGNOSIS — W010XXA Fall on same level from slipping, tripping and stumbling without subsequent striking against object, initial encounter: Secondary | ICD-10-CM | POA: Diagnosis present

## 2023-10-27 DIAGNOSIS — Z6836 Body mass index (BMI) 36.0-36.9, adult: Secondary | ICD-10-CM

## 2023-10-27 DIAGNOSIS — R17 Unspecified jaundice: Secondary | ICD-10-CM | POA: Diagnosis not present

## 2023-10-27 DIAGNOSIS — S22059A Unspecified fracture of T5-T6 vertebra, initial encounter for closed fracture: Secondary | ICD-10-CM

## 2023-10-27 DIAGNOSIS — F10931 Alcohol use, unspecified with withdrawal delirium: Secondary | ICD-10-CM | POA: Diagnosis not present

## 2023-10-27 DIAGNOSIS — Z9221 Personal history of antineoplastic chemotherapy: Secondary | ICD-10-CM

## 2023-10-27 DIAGNOSIS — G8929 Other chronic pain: Secondary | ICD-10-CM | POA: Diagnosis present

## 2023-10-27 DIAGNOSIS — S065X0A Traumatic subdural hemorrhage without loss of consciousness, initial encounter: Secondary | ICD-10-CM | POA: Diagnosis present

## 2023-10-27 DIAGNOSIS — G9341 Metabolic encephalopathy: Secondary | ICD-10-CM | POA: Diagnosis not present

## 2023-10-27 DIAGNOSIS — R0902 Hypoxemia: Secondary | ICD-10-CM

## 2023-10-27 DIAGNOSIS — E7220 Disorder of urea cycle metabolism, unspecified: Secondary | ICD-10-CM | POA: Diagnosis not present

## 2023-10-27 DIAGNOSIS — Z818 Family history of other mental and behavioral disorders: Secondary | ICD-10-CM

## 2023-10-27 DIAGNOSIS — I482 Chronic atrial fibrillation, unspecified: Secondary | ICD-10-CM | POA: Diagnosis present

## 2023-10-27 DIAGNOSIS — M4804 Spinal stenosis, thoracic region: Secondary | ICD-10-CM | POA: Diagnosis present

## 2023-10-27 DIAGNOSIS — Z515 Encounter for palliative care: Secondary | ICD-10-CM | POA: Diagnosis not present

## 2023-10-27 DIAGNOSIS — R569 Unspecified convulsions: Secondary | ICD-10-CM | POA: Diagnosis not present

## 2023-10-27 DIAGNOSIS — S22052A Unstable burst fracture of T5-T6 vertebra, initial encounter for closed fracture: Principal | ICD-10-CM | POA: Diagnosis present

## 2023-10-27 DIAGNOSIS — W19XXXA Unspecified fall, initial encounter: Principal | ICD-10-CM

## 2023-10-27 DIAGNOSIS — Z781 Physical restraint status: Secondary | ICD-10-CM | POA: Diagnosis not present

## 2023-10-27 DIAGNOSIS — Z8249 Family history of ischemic heart disease and other diseases of the circulatory system: Secondary | ICD-10-CM

## 2023-10-27 DIAGNOSIS — Z1152 Encounter for screening for COVID-19: Secondary | ICD-10-CM | POA: Diagnosis not present

## 2023-10-27 DIAGNOSIS — Z923 Personal history of irradiation: Secondary | ICD-10-CM

## 2023-10-27 DIAGNOSIS — E876 Hypokalemia: Secondary | ICD-10-CM | POA: Diagnosis present

## 2023-10-27 DIAGNOSIS — S066X0A Traumatic subarachnoid hemorrhage without loss of consciousness, initial encounter: Principal | ICD-10-CM | POA: Diagnosis present

## 2023-10-27 DIAGNOSIS — Z8349 Family history of other endocrine, nutritional and metabolic diseases: Secondary | ICD-10-CM

## 2023-10-27 DIAGNOSIS — Z853 Personal history of malignant neoplasm of breast: Secondary | ICD-10-CM

## 2023-10-27 DIAGNOSIS — R4182 Altered mental status, unspecified: Secondary | ICD-10-CM | POA: Diagnosis not present

## 2023-10-27 DIAGNOSIS — J810 Acute pulmonary edema: Secondary | ICD-10-CM | POA: Diagnosis present

## 2023-10-27 DIAGNOSIS — Z7189 Other specified counseling: Secondary | ICD-10-CM | POA: Diagnosis not present

## 2023-10-27 DIAGNOSIS — Z7901 Long term (current) use of anticoagulants: Secondary | ICD-10-CM

## 2023-10-27 DIAGNOSIS — Z823 Family history of stroke: Secondary | ICD-10-CM

## 2023-10-27 DIAGNOSIS — E8770 Fluid overload, unspecified: Secondary | ICD-10-CM | POA: Diagnosis present

## 2023-10-27 DIAGNOSIS — Z87891 Personal history of nicotine dependence: Secondary | ICD-10-CM

## 2023-10-27 DIAGNOSIS — R402413 Glasgow coma scale score 13-15, at hospital admission: Secondary | ICD-10-CM | POA: Diagnosis present

## 2023-10-27 HISTORY — DX: Alcohol abuse, uncomplicated: F10.10

## 2023-10-27 LAB — URINALYSIS, W/ REFLEX TO CULTURE (INFECTION SUSPECTED)
Bilirubin Urine: NEGATIVE
Glucose, UA: NEGATIVE mg/dL
Hgb urine dipstick: NEGATIVE
Ketones, ur: NEGATIVE mg/dL
Leukocytes,Ua: NEGATIVE
Nitrite: NEGATIVE
Protein, ur: NEGATIVE mg/dL
Specific Gravity, Urine: 1.017 (ref 1.005–1.030)
pH: 6 (ref 5.0–8.0)

## 2023-10-27 LAB — I-STAT CHEM 8, ED
BUN: 20 mg/dL (ref 8–23)
Calcium, Ion: 1.05 mmol/L — ABNORMAL LOW (ref 1.15–1.40)
Chloride: 92 mmol/L — ABNORMAL LOW (ref 98–111)
Creatinine, Ser: 0.8 mg/dL (ref 0.44–1.00)
Glucose, Bld: 98 mg/dL (ref 70–99)
HCT: 36 % (ref 36.0–46.0)
Hemoglobin: 12.2 g/dL (ref 12.0–15.0)
Potassium: 2.8 mmol/L — ABNORMAL LOW (ref 3.5–5.1)
Sodium: 141 mmol/L (ref 135–145)
TCO2: 37 mmol/L — ABNORMAL HIGH (ref 22–32)

## 2023-10-27 LAB — BRAIN NATRIURETIC PEPTIDE: B Natriuretic Peptide: 237 pg/mL — ABNORMAL HIGH (ref 0.0–100.0)

## 2023-10-27 LAB — TSH: TSH: 2.575 u[IU]/mL (ref 0.350–4.500)

## 2023-10-27 LAB — TROPONIN I (HIGH SENSITIVITY)
Troponin I (High Sensitivity): 38 ng/L — ABNORMAL HIGH (ref <18)
Troponin I (High Sensitivity): 27 ng/L — ABNORMAL HIGH (ref <18)

## 2023-10-27 LAB — RESP PANEL BY RT-PCR (RSV, FLU A&B, COVID)  RVPGX2
Influenza A by PCR: NEGATIVE
Influenza B by PCR: NEGATIVE
Resp Syncytial Virus by PCR: NEGATIVE
SARS Coronavirus 2 by RT PCR: NEGATIVE

## 2023-10-27 LAB — MRSA NEXT GEN BY PCR, NASAL: MRSA by PCR Next Gen: NOT DETECTED

## 2023-10-27 MED ORDER — HYDRALAZINE HCL 20 MG/ML IJ SOLN
10.0000 mg | INTRAMUSCULAR | Status: AC | PRN
  Administered 2023-10-29 (×4): 10 mg via INTRAVENOUS
  Filled 2023-10-27 (×2): qty 1

## 2023-10-27 MED ORDER — FENTANYL CITRATE PF 50 MCG/ML IJ SOSY
50.0000 ug | PREFILLED_SYRINGE | Freq: Once | INTRAMUSCULAR | Status: AC
  Administered 2023-10-27: 50 ug via INTRAVENOUS
  Filled 2023-10-27: qty 1

## 2023-10-27 MED ORDER — EMPTY CONTAINERS FLEXIBLE MISC
900.0000 mg | Freq: Once | Status: AC
  Administered 2023-10-27: 900 mg via INTRAVENOUS
  Filled 2023-10-27: qty 90

## 2023-10-27 MED ORDER — METHOCARBAMOL 500 MG PO TABS
500.0000 mg | ORAL_TABLET | Freq: Three times a day (TID) | ORAL | Status: AC
  Administered 2023-10-27 – 2023-10-30 (×8): 500 mg via ORAL
  Filled 2023-10-27 (×6): qty 1

## 2023-10-27 MED ORDER — METOPROLOL TARTRATE 5 MG/5ML IV SOLN
5.0000 mg | Freq: Four times a day (QID) | INTRAVENOUS | Status: AC | PRN
  Administered 2023-10-28 – 2023-11-01 (×4): 5 mg via INTRAVENOUS
  Filled 2023-10-27 (×4): qty 5

## 2023-10-27 MED ORDER — TRAMADOL HCL 50 MG PO TABS
50.0000 mg | ORAL_TABLET | Freq: Four times a day (QID) | ORAL | Status: DC | PRN
  Administered 2023-10-28 – 2023-10-30 (×8): 50 mg via ORAL
  Filled 2023-10-27 (×8): qty 1

## 2023-10-27 MED ORDER — LEVETIRACETAM IN NACL 500 MG/100ML IV SOLN
500.0000 mg | Freq: Two times a day (BID) | INTRAVENOUS | Status: DC
  Administered 2023-10-27 – 2023-10-28 (×2): 500 mg via INTRAVENOUS
  Filled 2023-10-27 (×2): qty 100

## 2023-10-27 MED ORDER — POLYETHYLENE GLYCOL 3350 17 G PO PACK: 17.0000 g | PACK | Freq: Every day | ORAL | Status: DC | PRN

## 2023-10-27 MED ORDER — DOCUSATE SODIUM 100 MG PO CAPS
100.0000 mg | ORAL_CAPSULE | Freq: Two times a day (BID) | ORAL | Status: DC
Start: 2023-10-27 — End: 2023-11-02
  Administered 2023-10-30 – 2023-10-31 (×2): 100 mg via ORAL
  Filled 2023-10-27 (×3): qty 1

## 2023-10-27 MED ORDER — ONDANSETRON HCL 4 MG/2ML IJ SOLN
4.0000 mg | Freq: Four times a day (QID) | INTRAMUSCULAR | Status: DC | PRN
  Administered 2023-10-29 – 2023-11-08 (×2): 4 mg via INTRAVENOUS
  Filled 2023-10-27 (×2): qty 2

## 2023-10-27 MED ORDER — METHOCARBAMOL 1000 MG/10ML IJ SOLN
500.0000 mg | Freq: Three times a day (TID) | INTRAMUSCULAR | Status: AC
  Administered 2023-10-29: 500 mg via INTRAVENOUS
  Filled 2023-10-27: qty 10

## 2023-10-27 MED ORDER — ORAL CARE MOUTH RINSE: 15.0000 mL | OROMUCOSAL | Status: DC | PRN

## 2023-10-27 MED ORDER — HYDROMORPHONE HCL 1 MG/ML IJ SOLN
0.5000 mg | INTRAMUSCULAR | Status: DC | PRN
  Administered 2023-10-27 – 2023-10-28 (×7): 0.5 mg via INTRAVENOUS
  Filled 2023-10-27 (×7): qty 1

## 2023-10-27 MED ORDER — ACETAMINOPHEN 500 MG PO TABS
1000.0000 mg | ORAL_TABLET | Freq: Four times a day (QID) | ORAL | Status: DC
  Administered 2023-10-27 – 2023-11-01 (×14): 1000 mg via ORAL
  Filled 2023-10-27 (×17): qty 2

## 2023-10-27 MED ORDER — IOHEXOL 350 MG/ML SOLN
75.0000 mL | Freq: Once | INTRAVENOUS | Status: AC | PRN
  Administered 2023-10-27: 75 mL via INTRAVENOUS

## 2023-10-27 MED ORDER — ONDANSETRON 4 MG PO TBDP: 4.0000 mg | ORAL_TABLET | Freq: Four times a day (QID) | ORAL | Status: DC | PRN

## 2023-10-27 MED ORDER — CHLORHEXIDINE GLUCONATE CLOTH 2 % EX PADS
6.0000 | MEDICATED_PAD | Freq: Every day | CUTANEOUS | Status: DC
  Administered 2023-10-27 – 2023-11-10 (×16): 6 via TOPICAL

## 2023-10-27 NOTE — Consult Note (Signed)
   Providing Compassionate, Quality Care - Together  Neurosurgery Consult  Referring physician: trauma Reason for referral: traumatic contusion, T6 fracture  Chief Complaint: Fall  History of Present Illness: This is a 72 year old female with chronic A-fib on Eliquis, who lost her balance and fell backward and hit her head.  Workup revealed left traumatic subarachnoid hemorrhage/contusion without significant mass effect.  There is also a T6 burst fracture with retropulsion and significant loss of height.  At this time she complains of severe back pain, denies any lower extremity numbness, tingling or weakness, bowel or bladder changes whatsoever.  States that she was supposed to see Dr. Lovell Sheehan tomorrow for surgical opinion of her fracture at T6.  She denies vision changes, seizure activity.  Eliquis was reversed in the ED due to the hemorrhagic contusion.   Medications: I have reviewed the patient's current medications. Allergies: No Known Allergies  History reviewed. No pertinent family history. Social History:  has no history on file for tobacco use, alcohol use, and drug use.  ROS: All pertinent positives and negatives are listed HPI above  Physical Exam:  Vital signs in last 24 hours: Temp:  [98 F (36.7 C)-98.3 F (36.8 C)] 98 F (36.7 C) (07/25 1814) Pulse Rate:  [58-128] 65 (07/26 0746) Resp:  [11-18] 14 (07/26 0217) BP: (138-182)/(65-125) 153/88 (07/26 0700) SpO2:  [91 %-98 %] 96 % (07/26 0746) PE: Awake alert oriented x 3 PERRLA Face symmetric Speech fluent appropriate Slightly labored breathing Full strength in upper and lower extremities throughout Sensory intact to light touch throughout   Impression/Assessment:  72 year old female with  Left traumatic subarachnoid hemorrhage/contusion T6 burst fracture with retropulsion  Plan:  -Strict bedrest -MRI T-spine pending -Keppra 500 twice daily x 7 days for seizure prophylaxis, repeat CT in a.m. -No acute  neurosurgical intervention at this time, will discuss with Dr. Lovell Sheehan about plan of care for her thoracic fracture  Thank you for allowing me to participate in this patient's care.  Please do not hesitate to call with questions or concerns.   Monia Pouch, DO Neurosurgeon [facility] [phone]

## 2023-10-27 NOTE — ED Provider Notes (Signed)
[place] EMERGENCY DEPARTMENT AT [facility] Provider Note   CSN: [id number] Arrival date & time: 10/27/23  1531     History  Chief Complaint  Patient presents with   Fall   Trauma    [name] is a 72 y.o. female.  The history is provided by medical records. No language interpreter was used.  Fall This is a new problem. The current episode started less than 1 hour ago. The problem occurs constantly. The problem has not changed since onset.Associated symptoms include headaches and shortness of breath. Pertinent negatives include no chest pain and no abdominal pain. Nothing aggravates the symptoms. Nothing relieves the symptoms. She has tried nothing for the symptoms. The treatment provided no relief.  Trauma Mechanism of injury: Fall Injury location: head/neck   Current symptoms:      Associated symptoms:            Reports back pain and headache.            Denies abdominal pain, chest pain, nausea, neck pain and vomiting.       Home Medications Prior to Admission medications   Medication Sig Start Date End Date Taking? Authorizing Provider  acetaminophen (TYLENOL) 500 MG tablet Take 1,000 mg by mouth every 6 (six) hours as needed for mild pain.    [provider]  amitriptyline (ELAVIL) 25 MG tablet Take 25 mg by mouth at bedtime. Patient not taking: Reported on 07/12/2023 01/12/23   [provider]  anastrozole (ARIMIDEX) 1 MG tablet TAKE ONE TABLET BY MOUTH DAILY. 07/20/23   Serena Croissant, MD  apixaban (ELIQUIS) 5 MG TABS tablet Take 1 tablet (5 mg total) by mouth 2 (two) times daily. 03/15/23   O'NealRonnald Ramp, MD  BELBUCA 300 MCG FILM Take by mouth 2 (two) times daily. 06/28/23   [provider]  carvedilol (COREG) 12.5 MG tablet Take 1 tablet (12.5 mg total) by mouth 2 (two) times daily. 06/15/22   [facility], Roe Rutherford, PA  chlordiazePOXIDE (LIBRIUM) 25 MG capsule Take 1 tab po tid for 2 days then 1 tab po bid for 2 days  then 1 tab po daily for 2 days Patient not taking: Reported on 07/12/2023 11/27/22   Erick Blinks, MD  diazepam (VALIUM) 10 MG tablet Take 10 mg by mouth at bedtime as needed. Patient not taking: Reported on 07/12/2023 01/10/23   [provider]  dicyclomine (BENTYL) 10 MG capsule Take 10 mg by mouth 4 (four) times daily -  before meals and at bedtime.    [provider]  DULoxetine (CYMBALTA) 60 MG capsule Take 60 mg by mouth 2 (two) times daily. 06/18/22   [provider]  furosemide (LASIX) 20 MG tablet Take 2 tablets (40 mg total) by mouth daily. 11/27/22   Erick Blinks, MD  HYDROcodone-acetaminophen (NORCO) 5-325 MG tablet Take 1 tablet by mouth every 6 (six) hours as needed for moderate pain. Patient not taking: Reported on 07/12/2023 01/01/23   Ma Rings, PA-C  levothyroxine (SYNTHROID) 175 MCG tablet Take 1 tablet (175 mcg total) by mouth daily. 09/21/22   Langston Reusing, MD  losartan (COZAAR) 50 MG tablet Take 50 mg by mouth daily.    [provider]  PEG-KCl-NaCl-NaSulf-Na Asc-C (PLENVU) 140 g SOLR Following the doctor's instructions, begin the evening before the procedure at 6pm Patient not taking: Reported on 07/12/2023 10/12/22   Imogene Burn, MD  potassium chloride (KLOR-CON) 10 MEQ tablet Take 10 mEq  by mouth daily. May take 1 additional tablet ( 10 MeQ) if taking lasix. 03/11/22   [provider]  sertraline (ZOLOFT) 50 MG tablet Take 1 tablet (50 mg total) by mouth daily. 11/28/22   Erick Blinks, MD  VIBERZI 100 MG TABS Take 1 tablet by mouth 2 (two) times daily.    [provider]  Vitamin D, Ergocalciferol, (DRISDOL) 1.25 MG (50000 UNIT) CAPS capsule Take 1 capsule (50,000 Units total) by mouth every 7 (seven) days. 09/21/22   Langston Reusing, MD      Allergies    Zolpidem tartrate    Review of Systems   Review of Systems  Constitutional:  Positive for fatigue. Negative for chills and fever.  HENT:   Positive for congestion.   Respiratory:  Positive for cough, chest tightness and shortness of breath. Negative for wheezing.   Cardiovascular:  Positive for leg swelling. Negative for chest pain and palpitations.  Gastrointestinal:  Negative for abdominal pain, constipation, diarrhea, nausea and vomiting.  Genitourinary:  Positive for frequency. Negative for dysuria.  Musculoskeletal:  Positive for back pain. Negative for neck pain and neck stiffness.  Skin:  Negative for pallor and wound.  Neurological:  Positive for light-headedness and headaches. Negative for syncope, weakness and numbness.  Psychiatric/Behavioral:  Negative for agitation and confusion.   All other systems reviewed and are negative.   Physical Exam Updated Vital Signs BP 116/64 (BP Location: Right Arm)   Pulse 77   Temp 98.1 F (36.7 C) (Oral)   Resp (!) 24   Ht 5\' 7"  (1.702 m)   Wt 90.7 kg   LMP  (LMP Unknown)   SpO2 (!) 89% Comment: Patient placed on 2L [place] at this time  BMI 31.32 kg/m  Physical Exam Vitals and nursing note reviewed.  Constitutional:      General: She is not in acute distress.    Appearance: She is well-developed. She is not ill-appearing, toxic-appearing or diaphoretic.  HENT:     Head: Normocephalic and atraumatic.     Right Ear: External ear normal.     Left Ear: External ear normal.     Nose: Nose normal. No congestion or rhinorrhea.     Mouth/Throat:     Mouth: Mucous membranes are moist.     Pharynx: No oropharyngeal exudate or posterior oropharyngeal erythema.  Eyes:     Conjunctiva/sclera: Conjunctivae normal.     Pupils: Pupils are equal, round, and reactive to light.  Cardiovascular:     Rate and Rhythm: Normal rate.  Pulmonary:     Effort: No respiratory distress.     Breath sounds: No stridor. Rales present. No wheezing or rhonchi.  Chest:     Chest wall: No tenderness.  Abdominal:     General: Abdomen is flat. There is no distension.     Tenderness: There is no  abdominal tenderness. There is no right CVA tenderness, left CVA tenderness, guarding or rebound.  Musculoskeletal:        General: Tenderness present.     Cervical back: Normal range of motion and neck supple. No tenderness.     Right lower leg: Edema present.     Left lower leg: Edema present.  Skin:    General: Skin is warm.     Coloration: Skin is not pale.     Findings: No erythema or rash.  Neurological:     Mental Status: She is alert and oriented to person, place, and time.  Sensory: No sensory deficit.     Motor: No weakness or abnormal muscle tone.     Deep Tendon Reflexes: Reflexes are normal and symmetric.  Psychiatric:        Mood and Affect: Mood normal.     ED Results / Procedures / Treatments   Labs (all labs ordered are listed, but only abnormal results are displayed) Labs Reviewed  URINALYSIS, W/ REFLEX TO CULTURE (INFECTION SUSPECTED) - Abnormal; Notable for the following components:      Result Value   Bacteria, UA RARE (*)    All other components within normal limits  BRAIN NATRIURETIC PEPTIDE - Abnormal; Notable for the following components:   B Natriuretic Peptide 237.0 (*)    All other components within normal limits  I-STAT CHEM 8, ED - Abnormal; Notable for the following components:   Potassium 2.8 (*)    Chloride 92 (*)    Calcium, Ion 1.05 (*)    TCO2 37 (*)    All other components within normal limits  TROPONIN I (HIGH SENSITIVITY) - Abnormal; Notable for the following components:   Troponin I (High Sensitivity) 38 (*)    All other components within normal limits  TROPONIN I (HIGH SENSITIVITY) - Abnormal; Notable for the following components:   Troponin I (High Sensitivity) 27 (*)    All other components within normal limits  RESP PANEL BY RT-PCR (RSV, FLU A&B, COVID)  RVPGX2  MRSA NEXT GEN BY PCR, NASAL  TSH  CBC  BASIC METABOLIC PANEL    EKG EKG Interpretation Date/Time:  Thursday October 27 2023 15:51:00 EST Ventricular Rate:   74 PR Interval:  189 QRS Duration:  83 QT Interval:  431 QTC Calculation: 479 R Axis:   -41  Text Interpretation: Sinus rhythm Left anterior fascicular block Abnormal R-wave progression, late transition Consider left ventricular hypertrophy when compared to prior, similar appearance. No STEMI Confirmed by Theda Belfast ([id number]) on 10/27/2023 4:42:39 PM  Radiology CT T-SPINE NO CHARGE  Result Date: 10/27/2023 CLINICAL DATA:  Larey Seat backwards, hit head, back pain EXAM: CT Thoracic and Lumbar spine with contrast TECHNIQUE: Multiplanar CT images of the thoracic and lumbar spine were reconstructed from contemporary CT of the Chest, Abdomen, and Pelvis. RADIATION DOSE REDUCTION: This exam was performed according to the departmental dose-optimization program which includes automated exposure control, adjustment of the mA and/or kV according to patient size and/or use of iterative reconstruction technique. CONTRAST:  No additional COMPARISON:  04/08/2022, 07/08/2022, 11/17/2022 FINDINGS: CT THORACIC SPINE FINDINGS Alignment: Exaggerated kyphosis at a T6 compression fracture. Otherwise alignment is anatomic. Vertebrae: There is an acute compression fracture at the T6 level, with greater than 75% loss of vertebral body height. Burst component of the fracture, the burst type fracture with retropulsion, with the posterior aspect of the vertebral body protruding 0.7 cm at the central canal. Additionally, there is a transverse comminuted fracture through the left T6 facet joint, with no evidence of dislocation. There is invagination of the superior endplate of the T5 vertebral body, with less than 25% loss of height, also suspicious for an acute compression fracture. No retropulsion or extension to the posterior elements. Bones are osteopenic.  No other acute thoracic spine fractures. There are acute fractures of the right fifth and sixth ribs at the costovertebral junction, as well as a minimally displaced right  posterolateral sixth rib fracture. Paraspinal and other soft tissues: Prominent paraspinal soft tissue swelling from T5 through T7 associated with the acute thoracic spine fractures. No evidence  of central canal hematoma. Trace right pleural effusion.  No evidence of pneumothorax. Disc levels: As discussed above, there is approximately 50% narrowing of the central canal at the T6 level due to burst component of the T6 compression fracture. Further evaluation with MRI may be useful. There is mild spondylosis within the thoracic spine. No other bony encroachment upon the central canal or neural foramina. CT LUMBAR SPINE FINDINGS Segmentation: 5 lumbar type vertebrae. Alignment: Alignment is grossly anatomic. Vertebrae: No acute lumbar spine fractures. No destructive bony abnormalities. Stable postsurgical changes from discectomy and posterior fusion at the L4-5 level. Paraspinal and other soft tissues: Paraspinal soft tissues are unremarkable. Please refer to separately reported CT abdomen and pelvis exam for findings in those regions. Disc levels: Stable postsurgical changes from discectomy and posterior fusion at the L4-5 level. Streak artifact limits evaluation at those levels. Spondylosis and facet hypertrophy from L1-2 through L3-4 again noted and not appreciably changed, with findings most pronounced at the L3-4 level with central canal stenosis and left greater than right neural foraminal encroachment. Mild central disc bulge at L5-S1 is stable. Reconstructed images demonstrate no additional findings. IMPRESSION: 1. Acute T6 compression fracture, with greater than 75% loss of height. Fracture line extends through the left T6 facet, without evidence of dislocation or subluxation. Burst component of the T6 compression fracture, with retropulsion extending 0.7 cm into the central canal resulting in at least 50% narrowing and likely underlying cord compression. Further evaluation with MRI recommended. 2. Acute  compression deformity superior endplate T5 vertebral body, with less than 25% loss of height. No retropulsion or extension to the posterior elements. 3. No acute lumbar spine fracture. 4. Multilevel thoracolumbar spondylosis and facet hypertrophy as above. 5. Stable postsurgical changes from L4-5 discectomy and posterior fusion. Critical Value/emergent results were called by telephone at the time of interpretation on 10/27/2023 at 6:18 pm to provider [facility] , who verbally acknowledged these results. Electronically Signed   By: Sharlet Salina M.D.   On: 10/27/2023 18:21   CT L-SPINE NO CHARGE  Result Date: 10/27/2023 CLINICAL DATA:  Larey Seat backwards, hit head, back pain EXAM: CT Thoracic and Lumbar spine with contrast TECHNIQUE: Multiplanar CT images of the thoracic and lumbar spine were reconstructed from contemporary CT of the Chest, Abdomen, and Pelvis. RADIATION DOSE REDUCTION: This exam was performed according to the departmental dose-optimization program which includes automated exposure control, adjustment of the mA and/or kV according to patient size and/or use of iterative reconstruction technique. CONTRAST:  No additional COMPARISON:  04/08/2022, 07/08/2022, 11/17/2022 FINDINGS: CT THORACIC SPINE FINDINGS Alignment: Exaggerated kyphosis at a T6 compression fracture. Otherwise alignment is anatomic. Vertebrae: There is an acute compression fracture at the T6 level, with greater than 75% loss of vertebral body height. Burst component of the fracture, the burst type fracture with retropulsion, with the posterior aspect of the vertebral body protruding 0.7 cm at the central canal. Additionally, there is a transverse comminuted fracture through the left T6 facet joint, with no evidence of dislocation. There is invagination of the superior endplate of the T5 vertebral body, with less than 25% loss of height, also suspicious for an acute compression fracture. No retropulsion or extension to the  posterior elements. Bones are osteopenic.  No other acute thoracic spine fractures. There are acute fractures of the right fifth and sixth ribs at the costovertebral junction, as well as a minimally displaced right posterolateral sixth rib fracture. Paraspinal and other soft tissues: Prominent paraspinal soft tissue swelling from  T5 through T7 associated with the acute thoracic spine fractures. No evidence of central canal hematoma. Trace right pleural effusion.  No evidence of pneumothorax. Disc levels: As discussed above, there is approximately 50% narrowing of the central canal at the T6 level due to burst component of the T6 compression fracture. Further evaluation with MRI may be useful. There is mild spondylosis within the thoracic spine. No other bony encroachment upon the central canal or neural foramina. CT LUMBAR SPINE FINDINGS Segmentation: 5 lumbar type vertebrae. Alignment: Alignment is grossly anatomic. Vertebrae: No acute lumbar spine fractures. No destructive bony abnormalities. Stable postsurgical changes from discectomy and posterior fusion at the L4-5 level. Paraspinal and other soft tissues: Paraspinal soft tissues are unremarkable. Please refer to separately reported CT abdomen and pelvis exam for findings in those regions. Disc levels: Stable postsurgical changes from discectomy and posterior fusion at the L4-5 level. Streak artifact limits evaluation at those levels. Spondylosis and facet hypertrophy from L1-2 through L3-4 again noted and not appreciably changed, with findings most pronounced at the L3-4 level with central canal stenosis and left greater than right neural foraminal encroachment. Mild central disc bulge at L5-S1 is stable. Reconstructed images demonstrate no additional findings. IMPRESSION: 1. Acute T6 compression fracture, with greater than 75% loss of height. Fracture line extends through the left T6 facet, without evidence of dislocation or subluxation. Burst component of  the T6 compression fracture, with retropulsion extending 0.7 cm into the central canal resulting in at least 50% narrowing and likely underlying cord compression. Further evaluation with MRI recommended. 2. Acute compression deformity superior endplate T5 vertebral body, with less than 25% loss of height. No retropulsion or extension to the posterior elements. 3. No acute lumbar spine fracture. 4. Multilevel thoracolumbar spondylosis and facet hypertrophy as above. 5. Stable postsurgical changes from L4-5 discectomy and posterior fusion. Critical Value/emergent results were called by telephone at the time of interpretation on 10/27/2023 at 6:18 pm to provider [facility] , who verbally acknowledged these results. Electronically Signed   By: Sharlet Salina M.D.   On: 10/27/2023 18:21   CT CERVICAL SPINE WO CONTRAST  Result Date: 10/27/2023 CLINICAL DATA:  Larey Seat, anticoagulated, hit head, back pain EXAM: CT CERVICAL SPINE WITHOUT CONTRAST TECHNIQUE: Multidetector CT imaging of the cervical spine was performed without intravenous contrast. Multiplanar CT image reconstructions were also generated. RADIATION DOSE REDUCTION: This exam was performed according to the departmental dose-optimization program which includes automated exposure control, adjustment of the mA and/or kV according to patient size and/or use of iterative reconstruction technique. COMPARISON:  12/12/2020 FINDINGS: The study is slightly limited by patient motion during the exam. Alignment: Alignment is grossly anatomic. Skull base and vertebrae: No acute fracture. No primary bone lesion or focal pathologic process. Soft tissues and spinal canal: No prevertebral fluid or swelling. No visible canal hematoma. Disc levels: Mild multilevel spondylosis from C3-4 through C6-7. Mild right neural foraminal encroachment at C5-6. Upper chest: Airway is patent. Emphysematous changes are seen at the lung apices. Other: Reconstructed images demonstrate no  additional findings. IMPRESSION: 1. No acute cervical spine fracture. 2. Mild cervical spondylosis greatest at C5-6. Electronically Signed   By: Sharlet Salina M.D.   On: 10/27/2023 18:05   CT CHEST ABDOMEN PELVIS W CONTRAST  Result Date: 10/27/2023 CLINICAL DATA:  Larey Seat backwards and hit her head. Patient on blood thinners. EXAM: CT CHEST, ABDOMEN, AND PELVIS WITH CONTRAST TECHNIQUE: Multidetector CT imaging of the chest, abdomen and pelvis was performed following the standard  protocol during bolus administration of intravenous contrast. RADIATION DOSE REDUCTION: This exam was performed according to the departmental dose-optimization program which includes automated exposure control, adjustment of the mA and/or kV according to patient size and/or use of iterative reconstruction technique. CONTRAST:  75mL OMNIPAQUE IOHEXOL 350 MG/ML SOLN COMPARISON:  CT angiogram chest 11/17/2022. Chest abdomen and pelvis 04/08/2021. FINDINGS: CT CHEST FINDINGS Cardiovascular: Heart is nonenlarged. Trace pericardial fluid. Coronary artery calcifications are seen. The thoracic aorta has a normal course and caliber. No mediastinal hematoma. There is slight pulsation artifact along the ascending aorta. Mediastinum/Nodes: Normal caliber thoracic esophagus. Small thyroid gland. Small nodes identified in the axillary regions, hilum or mediastinum. Lungs/Pleura: No consolidation, pneumothorax or effusion. There is some dependent atelectasis. There is scattered areas of scarring and atelectatic changes. Musculoskeletal: Surgical changes along the right breast. Significant streak artifact related to the patient's left shoulder reverse arthroplasty. There is acute mildly displaced fracture of the posterior aspect of the right sixth rib. There is severe compression of the vertebral body at T5 with displaced aspect of the posterior wall towards the canal centrally with stenosis. Mild compression of T4. These changes were not seen on the CT  scan of 2023. Please see separate spine CT scan from same date. An acute process is possible. CT ABDOMEN PELVIS FINDINGS Hepatobiliary: Dilated gallbladder with possible stones versus mass lesion. Mild ectasia of the biliary tree. No space-occupying liver lesion. Preserved hepatic enhancement. Patent portal vein. Pancreas: Unremarkable. No pancreatic ductal dilatation or surrounding inflammatory changes. Spleen: Normal in size without focal abnormality. Adrenals/Urinary Tract: Right adrenal gland is preserved. There is a left adrenal nodule measuring 2.2 cm. Unchanged from previous examination. This has been stable since at least April 2022 demonstrating over 2 years of stability. Slightly malrotated right kidney. No enhancing renal mass or collecting system dilatation. The ureters have normal course and caliber extending down to the bladder. Preserved contours of the urinary bladder. Stomach/Bowel: On this non oral contrast exam, large bowel has a normal caliber. Redundant course of the sigmoid colon. Scattered stool with some scattered left-sided colonic diverticula. Normal appendix in the right lower quadrant. Stomach and small bowel are nondilated. Vascular/Lymphatic: Aortic atherosclerosis. No enlarged abdominal or pelvic lymph nodes. Reproductive: Uterus and bilateral adnexa are unremarkable. Other: No free air or free fluid. Musculoskeletal: Scattered degenerative changes along the lumbar spine. There is streak artifact related to the posterior fusion hardware at L4-5. Multilevel disc bulging and posterior osteophytes with canal encroachment. IMPRESSION: No consolidation, pneumothorax or effusion. Severe compression deformity of T5 and mild at T4. These new from CT study of 2023. Please correlate for an acute compression. There is a associated canal encroachment at T5. Acute right sixth rib fracture. No bowel obstruction, free air or free fluid. No evidence of acute solid organ injury. Colonic diverticula.  Dilated gallbladder with dependent high density material. Favor gallstones. These were seen on prior CT. Stable left adrenal nodule going back to 2022 demonstrating over 2 years of stability. Electronically Signed   By: Karen Kays M.D.   On: 10/27/2023 18:04   DG Chest Port 1 View  Result Date: 10/27/2023 CLINICAL DATA:  Trauma. EXAM: PORTABLE CHEST 1 VIEW COMPARISON:  Chest radiograph dated 11/17/2022 and CT dated 11/17/2022. FINDINGS: Mild cardiomegaly with mild central vascular congestion. No focal consolidation, pleural effusion or pneumothorax. No acute osseous pathology. Left shoulder arthroplasty. IMPRESSION: Mild cardiomegaly with mild central vascular congestion. Electronically Signed   By: Elgie Collard M.D.   On: 10/27/2023  17:26   CT HEAD WO CONTRAST  Result Date: 10/27/2023 CLINICAL DATA:  Head trauma, moderate to severe EXAM: CT HEAD WITHOUT CONTRAST TECHNIQUE: Contiguous axial images were obtained from the base of the skull through the vertex without intravenous contrast. RADIATION DOSE REDUCTION: This exam was performed according to the departmental dose-optimization program which includes automated exposure control, adjustment of the mA and/or kV according to patient size and/or use of iterative reconstruction technique. COMPARISON:  12/12/2020 FINDINGS: Brain: Subarachnoid and subdural hemorrhage along the left middle cranial fossa, with the subdural component measuring approximately 4 mm. A small amount of hemorrhagic contusion is possible. Small amount of hyperdensity in the fourth ventricle inferiorly (series 6, image 39) may represent a small amount of intraventricular hemorrhage. Some hyperdensity is also seen around the optic chiasm on the left. No evidence of acute infarct, mass, mass effect, or midline shift. No hydrocephalus. Periventricular white matter changes, likely the sequela of chronic small vessel ischemic disease. Vascular: No hyperdense vessel. Skull: Negative for  fracture or focal lesion. Sinuses/Orbits: Left maxillary mucous retention cyst. Otherwise clear paranasal sinuses. Status post bilateral lens replacements. Other: The mastoid air cells are well aerated. IMPRESSION: 1. Subarachnoid and subdural hemorrhage along the left middle cranial fossa, with the subdural component measuring approximately 4 mm. A small amount of hemorrhagic contusion is possible. 2. Small amount of hyperdensity in the fourth ventricle inferiorly may represent a small amount of intraventricular hemorrhage. No hydrocephalus. 3. Some hyperdensity is also seen around the optic chiasm on the left, which could represent additional hemorrhage. An MRI of the brain without contrast is recommended for further evaluation. These findings were discussed by telephone on 10/27/2023 at 4:58 pm with provider Bridgitt Raggio . Electronically Signed   By: Wiliam Ke M.D.   On: 10/27/2023 17:00    Procedures Procedures    CRITICAL CARE Performed by: Canary Brim Yama Nielson Total critical care time: 45 minutes Critical care time was exclusive of separately billable procedures and treating other patients. Critical care was necessary to treat or prevent imminent or life-threatening deterioration. Critical care was time spent personally by me on the following activities: development of treatment plan with patient and/or surrogate as well as nursing, discussions with consultants, evaluation of patient's response to treatment, examination of patient, obtaining history from patient or surrogate, ordering and performing treatments and interventions, ordering and review of laboratory studies, ordering and review of radiographic studies, pulse oximetry and re-evaluation of patient's condition.  Medications Ordered in ED Medications  acetaminophen (TYLENOL) tablet 1,000 mg (1,000 mg Oral Not Given 10/27/23 2330)  methocarbamol (ROBAXIN) tablet 500 mg (500 mg Oral Given 10/27/23 2101)    Or  methocarbamol  (ROBAXIN) injection 500 mg ( Intravenous See Alternative 10/27/23 2101)  docusate sodium (COLACE) capsule 100 mg (100 mg Oral Not Given 10/27/23 2330)  polyethylene glycol (MIRALAX / GLYCOLAX) packet 17 g (has no administration in time range)  ondansetron (ZOFRAN-ODT) disintegrating tablet 4 mg (has no administration in time range)    Or  ondansetron (ZOFRAN) injection 4 mg (has no administration in time range)  metoprolol tartrate (LOPRESSOR) injection 5 mg (has no administration in time range)  hydrALAZINE (APRESOLINE) injection 10 mg (has no administration in time range)  traMADol (ULTRAM) tablet 50 mg (has no administration in time range)  HYDROmorphone (DILAUDID) injection 0.5 mg (0.5 mg Intravenous Given 10/27/23 2326)  levETIRAcetam (KEPPRA) IVPB 500 mg/100 mL premix (500 mg Intravenous New Bag/Given 10/27/23 2136)  Chlorhexidine Gluconate Cloth 2 % PADS 6  each (6 each Topical Given 10/27/23 2045)  Oral care mouth rinse (has no administration in time range)  iohexol (OMNIPAQUE) 350 MG/ML injection 75 mL (75 mLs Intravenous Contrast Given 10/27/23 1644)  fentaNYL (SUBLIMAZE) injection 50 mcg (50 mcg Intravenous Given 10/27/23 1800)  coag fact Xa recombinant (ANDEXXA) low dose infusion 900 mg (0 mg Intravenous Stopped 10/27/23 2007)  fentaNYL (SUBLIMAZE) injection 50 mcg (50 mcg Intravenous Given 10/27/23 1916)    ED Course/ Medical Decision Making/ A&P                                 Medical Decision Making Amount and/or Complexity of Data Reviewed Labs: ordered. Radiology: ordered.  Risk Prescription drug management. Decision regarding hospitalization.    VANESHA BURDON is a 72 y.o. female with a past medical history significant for hypothyroidism, hypertension, atrial fibrillation on Eliquis therapy, spinal stenosis was previous back surgery, and recent fall with L-spine fracture who presents with 1 month of worsening exertional shortness of breath, fatigue, decreased appetite,  recent urinary frequency, lightheadedness, and a fall today hitting the back of her head.  Patient reports that today she was in the kitchen and was lightheaded and then fell to the ground.  She is having some pain in the back of her head in the occipital area from where she fell and has a hematoma there.  EMS reported when she sat up her blood pressure did drop into the 80s but then she laid back down.  She is denying chest pain or palpitations.  Denies nausea or vomiting.  Denies any current constipation but has some mild intermittent diarrhea.  No blood reported on her stools..  She reports no leg pain, leg weakness, leg numbness, or any loss of bowel or bladder control.  She does report some urinary frequency but denies dysuria.  Denies any actual fevers.  Reports has had more exertional shortness of breath and getting very winded and fatigued.  She has had decreased appetite she reports.   On my exam, oxygen saturation's are dipping into the 80s on room air.  Lungs were clear and chest was nontender.  Abdomen was nontender.  Patient did have tenderness in the thoracic and upper lumbar spine.  Neck was nontender.  Small bump on the back of her head with no laceration.  Intact sensation and strength in extremities.  Good pulses.  Clinically I am concerned about ruling out traumatic injury from this fall in the setting of known back fracture but I am also concerned about this hypoxia, soft pressures with EMS, and this worsening fatigue with exertional shortness of breath.  Will place her on oxygen and get workup to look for traumatic injuries.  Will get CT of the head, neck, and chest/abdomen/pelvis with spine reformatting.  Will make sure there is not some pneumothorax or collapsed lung on x-ray first.  Will get screening labs including TSH with his for fatigue and urinalysis given the frequency.  Her legs are edematous on exam so we will get a BNP as exertional shortness of breath and hypoxia with edema  could be related to heart failure.  She says that she has more chronic edema in the legs however.  Due to her hypoxia and clear discomfort, anticipate she may need admission after workup is completed.  5:02 PM Radiologist called to report that patient does have some concern for subdural, subarachnoid, intraparenchymal contusion, and possibly some bleeding at  the optic chiasm and the fourth ventricle.  They recommended MRI without contrast which was ordered.  Will call neurosurgery to get their take but anticipate admission regardless for the new hypoxia with shortness of breath and the fatigue as well.  5:27 PM Spoke to Dr. Jake Samples with neurosurgery who recommended reversal of the Eliquis due to the contusion and bleeding in the head.  He also looked at the CT of her thoracic spine and wants [place] to get MRI because it looks like a significant fracture in her T-spine.  They will see in consultation.  He does not feel she likely is ICU but does think she needs at least a progressive bed.  Pharmacy was called to help place orders for Eliquis reversal.  Wait for the other imaging to return and then call for admission.  6:49 PM Chest/ab/pelvis imaging also returned showing evidence of rib fracture.  Due to the head bleed, spine fracture, and rib fracture, will call trauma for admission.  Will likely need progressive bed as recommended by neurosurgery.  They will see for further recommendations.        Final Clinical Impression(s) / ED Diagnoses Final diagnoses:  Fall, initial encounter  Closed fracture of sixth thoracic vertebra, unspecified fracture morphology, initial encounter (HCC)  Subarachnoid hemorrhage (HCC)  Subdural hematoma (HCC)  Hypoxia  Closed fracture of one rib, unspecified laterality, initial encounter   Clinical Impression: 1. Fall, initial encounter   2. Closed fracture of sixth thoracic vertebra, unspecified fracture morphology, initial encounter (HCC)   3. Subarachnoid  hemorrhage (HCC)   4. Subdural hematoma (HCC)   5. Hypoxia   6. Closed fracture of one rib, unspecified laterality, initial encounter     Disposition: Admit  This note was prepared with assistance of Dragon voice recognition software. Occasional wrong-word or sound-a-like substitutions may have occurred due to the inherent limitations of voice recognition software.       Avantika Shere, Canary Brim, MD 10/28/23 [phone]

## 2023-10-27 NOTE — Progress Notes (Signed)
Patient transported to MRI @2230 , returned to unit @ 2330. Pt VSS and no events.

## 2023-10-27 NOTE — ED Notes (Addendum)
Patient presents via EMS from home for fall on thinners. Patient states that she got up off the sofa and then fell backwards and hit her head. Patient denies LOC. Per EMS, patient has prior known L4-L5 fracture and left wrist fracture. Patient endorsing back pain upon arrival. Patient denies neck pain. Per EMS, they were initially called out for a lift assist. Patient takes eliquis for atrial fibrillation. Per EMS, patient has had decreased appetite for several days. Patient states that she gets dizzy when she stands up. Per EMS, patient had orthostatic BP changes (110/56 while sitting and then 86/66 when standing). Per EMS, patient lives at home with her husband.

## 2023-10-27 NOTE — H&P (Signed)
**Note Isabel-Identified via Obfuscation** Reason for Consult:fall Referring Provider: Theda Belfast, M.D.  [name] is an 72 y.o. female.  HPI: 72 yo female was walking and lost balance and fell backward. She complains of intense pain in her back. She takes eliquis for atrial fibrillation.  Past Medical History:  Diagnosis Date   Anxiety    Atrial fibrillation [facility])    sees Dr. Flora Lipps   Bilateral swelling of feet    Cancer [facility])    breast   Chronic diarrhea    Chronic pain    Concussion 02/2007   ICU x 3 days   Depression    Hypertension    Hypothyroidism    Personal history of chemotherapy    Personal history of radiation therapy    Spinal stenosis    Thyroid disease ?1994   Vitamin D deficiency     Past Surgical History:  Procedure Laterality Date   BREAST BIOPSY Right 12/20/2019   x2   BREAST LUMPECTOMY Right 01/22/2020   BREAST LUMPECTOMY WITH RADIOACTIVE SEED AND SENTINEL LYMPH NODE BIOPSY Right 01/22/2020   Procedure: RIGHT BREAST LUMPECTOMY WITH RADIOACTIVE SEED X2 AND RIGHT SENTINEL LYMPH NODE MAPPING;  Surgeon: Harriette Bouillon, MD;  Location: [facility];  Service: General;  Laterality: Right;   BREAST SURGERY Right 03/1998   breast biopsy, benign   EYE SURGERY Right    PORT-A-CATH REMOVAL Right 04/30/2021   Procedure: REMOVAL PORT-A-CATH;  Surgeon: Harriette Bouillon, MD;  Location: [facility];  Service: General;  Laterality: Right;   PORTACATH PLACEMENT Right 01/22/2020   Procedure: INSERTION PORT-A-CATH WITH ULTRASOUND;  Surgeon: Harriette Bouillon, MD;  Location: [facility];  Service: General;  Laterality: Right;   PORTACATH PLACEMENT Right 02/28/2020   Procedure: PORT A CATH REVISION;  Surgeon: Harriette Bouillon, MD;  Location: [facility];  Service: General;  Laterality: Right;   REVERSE SHOULDER ARTHROPLASTY Left 02/02/2021   Procedure: LEFT REVERSE SHOULDER ARTHROPLASTY;  Surgeon: Cammy Copa, MD;  Location: [facility] OR;  Service: Orthopedics;   Laterality: Left;   SHOULDER SURGERY Right    SPINAL FUSION  03/04/2011   with ORIF    Family History  Problem Relation Age of Onset   Thyroid disease Mother    Dementia Mother    Depression Mother    Anxiety disorder Mother    Diabetes Father    Stroke Father    Alcoholism Father    Hypertension Sister    Diabetes Sister    Colon cancer Neg Hx    Colon polyps Neg Hx    Stomach cancer Neg Hx    Esophageal cancer Neg Hx     Social History:  reports that she quit smoking about 44 years ago. Her smoking use included cigarettes. She started smoking about 64 years ago. She has never used smokeless tobacco. She reports current alcohol use of about 5.0 standard drinks of alcohol per week. She reports that she does not use drugs.  Allergies:  Allergies  Allergen Reactions   Zolpidem Tartrate Other (See Comments)    Hallucinations and Confusion Other reaction(s): Hallucinations confusion     Medications: I have reviewed the patient's current medications.  Results for orders placed or performed during the hospital encounter of 10/27/23 (from the past 48 hour(s))  Troponin I (High Sensitivity)     Status: Abnormal   Collection Time: 10/27/23  3:49 PM  Result Value Ref Range   Troponin I (High Sensitivity) 38 (H) <18 ng/L    Comment: (  NOTE) Elevated high sensitivity troponin I (hsTnI) values and significant  changes across serial measurements may suggest ACS but many other  chronic and acute conditions are known to elevate hsTnI results.  Refer to the "Links" section for chest pain algorithms and additional  guidance. Performed at [facility], [address], [place], [place] [address]   TSH     Status: None   Collection Time: 10/27/23  3:49 PM  Result Value Ref Range   TSH 2.575 0.350 - 4.500 uIU/mL    Comment: Performed by a 3rd Generation assay with a functional sensitivity of <=0.01 uIU/mL. Performed at [facility], [address] [place], [place]  [address]   Brain natriuretic peptide     Status: Abnormal   Collection Time: 10/27/23  3:51 PM  Result Value Ref Range   B Natriuretic Peptide 237.0 (H) 0.0 - 100.0 pg/mL    Comment: Performed at [facility], [address] [place], [place] [address]  Resp panel by RT-PCR (RSV, Flu A&B, Covid) Anterior Nasal Swab     Status: None   Collection Time: 10/27/23  4:12 PM   Specimen: Anterior Nasal Swab  Result Value Ref Range   SARS Coronavirus 2 by RT PCR NEGATIVE NEGATIVE   Influenza A by PCR NEGATIVE NEGATIVE   Influenza B by PCR NEGATIVE NEGATIVE    Comment: (NOTE) The Xpert Xpress SARS-CoV-2/FLU/RSV plus assay is intended as an aid in the diagnosis of influenza from Nasopharyngeal swab specimens and should not be used as a sole basis for treatment. Nasal washings and aspirates are unacceptable for Xpert Xpress SARS-CoV-2/FLU/RSV testing.  Fact Sheet for Patients: BloggerCourse.com  Fact Sheet for Healthcare Providers: SeriousBroker.it  This test is not yet approved or cleared by the [place] FDA and has been authorized for detection and/or diagnosis of SARS-CoV-2 by FDA under an Emergency Use Authorization (EUA). This EUA will remain in effect (meaning this test can be used) for the duration of the COVID-19 declaration under Section 564(b)(1) of the Act, 21 U.S.C. section 360bbb-3(b)(1), unless the authorization is terminated or revoked.     Resp Syncytial Virus by PCR NEGATIVE NEGATIVE    Comment: (NOTE) Fact Sheet for Patients: BloggerCourse.com  Fact Sheet for Healthcare Providers: SeriousBroker.it  This test is not yet approved or cleared by the [place] FDA and has been authorized for detection and/or diagnosis of SARS-CoV-2 by FDA under an Emergency Use Authorization (EUA). This EUA will remain in effect (meaning this test can be used) for the duration  of the COVID-19 declaration under Section 564(b)(1) of the Act, 21 U.S.C. section 360bbb-3(b)(1), unless the authorization is terminated or revoked.  Performed at [facility], [address] [place], [place] [address]   I-stat chem 8, ED (not at [facility], [facility] or [facility])     Status: Abnormal   Collection Time: 10/27/23  4:20 PM  Result Value Ref Range   Sodium 141 135 - 145 mmol/L   Potassium 2.8 (L) 3.5 - 5.1 mmol/L   Chloride 92 (L) 98 - 111 mmol/L   BUN 20 8 - 23 mg/dL   Creatinine, Ser 4.03 0.44 - 1.00 mg/dL   Glucose, Bld 98 70 - 99 mg/dL    Comment: Glucose reference range applies only to samples taken after fasting for at least 8 hours.   Calcium, Ion 1.05 (L) 1.15 - 1.40 mmol/L   TCO2 37 (H) 22 - 32 mmol/L   Hemoglobin 12.2 12.0 -  15.0 g/dL   HCT 16.1 09.6 - 04.5 %  Urinalysis, w/ Reflex to Culture (Infection Suspected) -Urine, Clean Catch     Status: Abnormal   Collection Time: 10/27/23  4:50 PM  Result Value Ref Range   Specimen Source URINE, CLEAN CATCH    Color, Urine YELLOW YELLOW   APPearance CLEAR CLEAR   Specific Gravity, Urine 1.017 1.005 - 1.030   pH 6.0 5.0 - 8.0   Glucose, UA NEGATIVE NEGATIVE mg/dL   Hgb urine dipstick NEGATIVE NEGATIVE   Bilirubin Urine NEGATIVE NEGATIVE   Ketones, ur NEGATIVE NEGATIVE mg/dL   Protein, ur NEGATIVE NEGATIVE mg/dL   Nitrite NEGATIVE NEGATIVE   Leukocytes,Ua NEGATIVE NEGATIVE   RBC / HPF 0-5 0 - 5 RBC/hpf   WBC, UA 0-5 0 - 5 WBC/hpf    Comment:        Reflex urine culture not performed if WBC <=10, OR if Squamous epithelial cells >5. If Squamous epithelial cells >5 suggest recollection.    Bacteria, UA RARE (A) NONE SEEN   Squamous Epithelial / HPF 0-5 0 - 5 /HPF   Mucus PRESENT    Hyaline Casts, UA PRESENT     Comment: Performed at [facility], [address], [place], [place] [address]  Troponin I (High Sensitivity)     Status: Abnormal   Collection Time: 10/27/23  6:04 PM  Result Value Ref Range    Troponin I (High Sensitivity) 27 (H) <18 ng/L    Comment: (NOTE) Elevated high sensitivity troponin I (hsTnI) values and significant  changes across serial measurements may suggest ACS but many other  chronic and acute conditions are known to elevate hsTnI results.  Refer to the "Links" section for chest pain algorithms and additional  guidance. Performed at [facility], [address] [place], [place] [address]     CT T-SPINE NO CHARGE  Result Date: 10/27/2023 CLINICAL DATA:  Larey Seat backwards, hit head, back pain EXAM: CT Thoracic and Lumbar spine with contrast TECHNIQUE: Multiplanar CT images of the thoracic and lumbar spine were reconstructed from contemporary CT of the Chest, Abdomen, and Pelvis. RADIATION DOSE REDUCTION: This exam was performed according to the departmental dose-optimization program which includes automated exposure control, adjustment of the mA and/or kV according to patient size and/or use of iterative reconstruction technique. CONTRAST:  No additional COMPARISON:  04/08/2022, 07/08/2022, 11/17/2022 FINDINGS: CT THORACIC SPINE FINDINGS Alignment: Exaggerated kyphosis at a T6 compression fracture. Otherwise alignment is anatomic. Vertebrae: There is an acute compression fracture at the T6 level, with greater than 75% loss of vertebral body height. Burst component of the fracture, the burst type fracture with retropulsion, with the posterior aspect of the vertebral body protruding 0.7 cm at the central canal. Additionally, there is a transverse comminuted fracture through the left T6 facet joint, with no evidence of dislocation. There is invagination of the superior endplate of the T5 vertebral body, with less than 25% loss of height, also suspicious for an acute compression fracture. No retropulsion or extension to the posterior elements. Bones are osteopenic.  No other acute thoracic spine fractures. There are acute fractures of the right fifth and sixth ribs at the  costovertebral junction, as well as a minimally displaced right posterolateral sixth rib fracture. Paraspinal and other soft tissues: Prominent paraspinal soft tissue swelling from T5 through T7 associated with the acute thoracic spine fractures. No evidence of central canal hematoma. Trace right pleural effusion.  No evidence of pneumothorax. Disc levels:  As discussed above, there is approximately 50% narrowing of the central canal at the T6 level due to burst component of the T6 compression fracture. Further evaluation with MRI may be useful. There is mild spondylosis within the thoracic spine. No other bony encroachment upon the central canal or neural foramina. CT LUMBAR SPINE FINDINGS Segmentation: 5 lumbar type vertebrae. Alignment: Alignment is grossly anatomic. Vertebrae: No acute lumbar spine fractures. No destructive bony abnormalities. Stable postsurgical changes from discectomy and posterior fusion at the L4-5 level. Paraspinal and other soft tissues: Paraspinal soft tissues are unremarkable. Please refer to separately reported CT abdomen and pelvis exam for findings in those regions. Disc levels: Stable postsurgical changes from discectomy and posterior fusion at the L4-5 level. Streak artifact limits evaluation at those levels. Spondylosis and facet hypertrophy from L1-2 through L3-4 again noted and not appreciably changed, with findings most pronounced at the L3-4 level with central canal stenosis and left greater than right neural foraminal encroachment. Mild central disc bulge at L5-S1 is stable. Reconstructed images demonstrate no additional findings. IMPRESSION: 1. Acute T6 compression fracture, with greater than 75% loss of height. Fracture line extends through the left T6 facet, without evidence of dislocation or subluxation. Burst component of the T6 compression fracture, with retropulsion extending 0.7 cm into the central canal resulting in at least 50% narrowing and likely underlying cord  compression. Further evaluation with MRI recommended. 2. Acute compression deformity superior endplate T5 vertebral body, with less than 25% loss of height. No retropulsion or extension to the posterior elements. 3. No acute lumbar spine fracture. 4. Multilevel thoracolumbar spondylosis and facet hypertrophy as above. 5. Stable postsurgical changes from L4-5 discectomy and posterior fusion. Critical Value/emergent results were called by telephone at the time of interpretation on 10/27/2023 at 6:18 pm to provider [facility] , who verbally acknowledged these results. Electronically Signed   By: Sharlet Salina M.D.   On: 10/27/2023 18:21   CT L-SPINE NO CHARGE  Result Date: 10/27/2023 CLINICAL DATA:  Larey Seat backwards, hit head, back pain EXAM: CT Thoracic and Lumbar spine with contrast TECHNIQUE: Multiplanar CT images of the thoracic and lumbar spine were reconstructed from contemporary CT of the Chest, Abdomen, and Pelvis. RADIATION DOSE REDUCTION: This exam was performed according to the departmental dose-optimization program which includes automated exposure control, adjustment of the mA and/or kV according to patient size and/or use of iterative reconstruction technique. CONTRAST:  No additional COMPARISON:  04/08/2022, 07/08/2022, 11/17/2022 FINDINGS: CT THORACIC SPINE FINDINGS Alignment: Exaggerated kyphosis at a T6 compression fracture. Otherwise alignment is anatomic. Vertebrae: There is an acute compression fracture at the T6 level, with greater than 75% loss of vertebral body height. Burst component of the fracture, the burst type fracture with retropulsion, with the posterior aspect of the vertebral body protruding 0.7 cm at the central canal. Additionally, there is a transverse comminuted fracture through the left T6 facet joint, with no evidence of dislocation. There is invagination of the superior endplate of the T5 vertebral body, with less than 25% loss of height, also suspicious for an acute  compression fracture. No retropulsion or extension to the posterior elements. Bones are osteopenic.  No other acute thoracic spine fractures. There are acute fractures of the right fifth and sixth ribs at the costovertebral junction, as well as a minimally displaced right posterolateral sixth rib fracture. Paraspinal and other soft tissues: Prominent paraspinal soft tissue swelling from T5 through T7 associated with the acute thoracic spine fractures. No evidence of central canal  hematoma. Trace right pleural effusion.  No evidence of pneumothorax. Disc levels: As discussed above, there is approximately 50% narrowing of the central canal at the T6 level due to burst component of the T6 compression fracture. Further evaluation with MRI may be useful. There is mild spondylosis within the thoracic spine. No other bony encroachment upon the central canal or neural foramina. CT LUMBAR SPINE FINDINGS Segmentation: 5 lumbar type vertebrae. Alignment: Alignment is grossly anatomic. Vertebrae: No acute lumbar spine fractures. No destructive bony abnormalities. Stable postsurgical changes from discectomy and posterior fusion at the L4-5 level. Paraspinal and other soft tissues: Paraspinal soft tissues are unremarkable. Please refer to separately reported CT abdomen and pelvis exam for findings in those regions. Disc levels: Stable postsurgical changes from discectomy and posterior fusion at the L4-5 level. Streak artifact limits evaluation at those levels. Spondylosis and facet hypertrophy from L1-2 through L3-4 again noted and not appreciably changed, with findings most pronounced at the L3-4 level with central canal stenosis and left greater than right neural foraminal encroachment. Mild central disc bulge at L5-S1 is stable. Reconstructed images demonstrate no additional findings. IMPRESSION: 1. Acute T6 compression fracture, with greater than 75% loss of height. Fracture line extends through the left T6 facet, without  evidence of dislocation or subluxation. Burst component of the T6 compression fracture, with retropulsion extending 0.7 cm into the central canal resulting in at least 50% narrowing and likely underlying cord compression. Further evaluation with MRI recommended. 2. Acute compression deformity superior endplate T5 vertebral body, with less than 25% loss of height. No retropulsion or extension to the posterior elements. 3. No acute lumbar spine fracture. 4. Multilevel thoracolumbar spondylosis and facet hypertrophy as above. 5. Stable postsurgical changes from L4-5 discectomy and posterior fusion. Critical Value/emergent results were called by telephone at the time of interpretation on 10/27/2023 at 6:18 pm to provider [facility] , who verbally acknowledged these results. Electronically Signed   By: Sharlet Salina M.D.   On: 10/27/2023 18:21   CT CERVICAL SPINE WO CONTRAST  Result Date: 10/27/2023 CLINICAL DATA:  Larey Seat, anticoagulated, hit head, back pain EXAM: CT CERVICAL SPINE WITHOUT CONTRAST TECHNIQUE: Multidetector CT imaging of the cervical spine was performed without intravenous contrast. Multiplanar CT image reconstructions were also generated. RADIATION DOSE REDUCTION: This exam was performed according to the departmental dose-optimization program which includes automated exposure control, adjustment of the mA and/or kV according to patient size and/or use of iterative reconstruction technique. COMPARISON:  12/12/2020 FINDINGS: The study is slightly limited by patient motion during the exam. Alignment: Alignment is grossly anatomic. Skull base and vertebrae: No acute fracture. No primary bone lesion or focal pathologic process. Soft tissues and spinal canal: No prevertebral fluid or swelling. No visible canal hematoma. Disc levels: Mild multilevel spondylosis from C3-4 through C6-7. Mild right neural foraminal encroachment at C5-6. Upper chest: Airway is patent. Emphysematous changes are seen at  the lung apices. Other: Reconstructed images demonstrate no additional findings. IMPRESSION: 1. No acute cervical spine fracture. 2. Mild cervical spondylosis greatest at C5-6. Electronically Signed   By: Sharlet Salina M.D.   On: 10/27/2023 18:05   CT CHEST ABDOMEN PELVIS W CONTRAST  Result Date: 10/27/2023 CLINICAL DATA:  Larey Seat backwards and hit her head. Patient on blood thinners. EXAM: CT CHEST, ABDOMEN, AND PELVIS WITH CONTRAST TECHNIQUE: Multidetector CT imaging of the chest, abdomen and pelvis was performed following the standard protocol during bolus administration of intravenous contrast. RADIATION DOSE REDUCTION: This exam was performed according  to the departmental dose-optimization program which includes automated exposure control, adjustment of the mA and/or kV according to patient size and/or use of iterative reconstruction technique. CONTRAST:  75mL OMNIPAQUE IOHEXOL 350 MG/ML SOLN COMPARISON:  CT angiogram chest 11/17/2022. Chest abdomen and pelvis 04/08/2021. FINDINGS: CT CHEST FINDINGS Cardiovascular: Heart is nonenlarged. Trace pericardial fluid. Coronary artery calcifications are seen. The thoracic aorta has a normal course and caliber. No mediastinal hematoma. There is slight pulsation artifact along the ascending aorta. Mediastinum/Nodes: Normal caliber thoracic esophagus. Small thyroid gland. Small nodes identified in the axillary regions, hilum or mediastinum. Lungs/Pleura: No consolidation, pneumothorax or effusion. There is some dependent atelectasis. There is scattered areas of scarring and atelectatic changes. Musculoskeletal: Surgical changes along the right breast. Significant streak artifact related to the patient's left shoulder reverse arthroplasty. There is acute mildly displaced fracture of the posterior aspect of the right sixth rib. There is severe compression of the vertebral body at T5 with displaced aspect of the posterior wall towards the canal centrally with stenosis.  Mild compression of T4. These changes were not seen on the CT scan of 2023. Please see separate spine CT scan from same date. An acute process is possible. CT ABDOMEN PELVIS FINDINGS Hepatobiliary: Dilated gallbladder with possible stones versus mass lesion. Mild ectasia of the biliary tree. No space-occupying liver lesion. Preserved hepatic enhancement. Patent portal vein. Pancreas: Unremarkable. No pancreatic ductal dilatation or surrounding inflammatory changes. Spleen: Normal in size without focal abnormality. Adrenals/Urinary Tract: Right adrenal gland is preserved. There is a left adrenal nodule measuring 2.2 cm. Unchanged from previous examination. This has been stable since at least April 2022 demonstrating over 2 years of stability. Slightly malrotated right kidney. No enhancing renal mass or collecting system dilatation. The ureters have normal course and caliber extending down to the bladder. Preserved contours of the urinary bladder. Stomach/Bowel: On this non oral contrast exam, large bowel has a normal caliber. Redundant course of the sigmoid colon. Scattered stool with some scattered left-sided colonic diverticula. Normal appendix in the right lower quadrant. Stomach and small bowel are nondilated. Vascular/Lymphatic: Aortic atherosclerosis. No enlarged abdominal or pelvic lymph nodes. Reproductive: Uterus and bilateral adnexa are unremarkable. Other: No free air or free fluid. Musculoskeletal: Scattered degenerative changes along the lumbar spine. There is streak artifact related to the posterior fusion hardware at L4-5. Multilevel disc bulging and posterior osteophytes with canal encroachment. IMPRESSION: No consolidation, pneumothorax or effusion. Severe compression deformity of T5 and mild at T4. These new from CT study of 2023. Please correlate for an acute compression. There is a associated canal encroachment at T5. Acute right sixth rib fracture. No bowel obstruction, free air or free fluid. No  evidence of acute solid organ injury. Colonic diverticula. Dilated gallbladder with dependent high density material. Favor gallstones. These were seen on prior CT. Stable left adrenal nodule going back to 2022 demonstrating over 2 years of stability. Electronically Signed   By: Karen Kays M.D.   On: 10/27/2023 18:04   DG Chest Port 1 View  Result Date: 10/27/2023 CLINICAL DATA:  Trauma. EXAM: PORTABLE CHEST 1 VIEW COMPARISON:  Chest radiograph dated 11/17/2022 and CT dated 11/17/2022. FINDINGS: Mild cardiomegaly with mild central vascular congestion. No focal consolidation, pleural effusion or pneumothorax. No acute osseous pathology. Left shoulder arthroplasty. IMPRESSION: Mild cardiomegaly with mild central vascular congestion. Electronically Signed   By: Elgie Collard M.D.   On: 10/27/2023 17:26   CT HEAD WO CONTRAST  Result Date: 10/27/2023 CLINICAL DATA:  Head  trauma, moderate to severe EXAM: CT HEAD WITHOUT CONTRAST TECHNIQUE: Contiguous axial images were obtained from the base of the skull through the vertex without intravenous contrast. RADIATION DOSE REDUCTION: This exam was performed according to the departmental dose-optimization program which includes automated exposure control, adjustment of the mA and/or kV according to patient size and/or use of iterative reconstruction technique. COMPARISON:  12/12/2020 FINDINGS: Brain: Subarachnoid and subdural hemorrhage along the left middle cranial fossa, with the subdural component measuring approximately 4 mm. A small amount of hemorrhagic contusion is possible. Small amount of hyperdensity in the fourth ventricle inferiorly (series 6, image 39) may represent a small amount of intraventricular hemorrhage. Some hyperdensity is also seen around the optic chiasm on the left. No evidence of acute infarct, mass, mass effect, or midline shift. No hydrocephalus. Periventricular white matter changes, likely the sequela of chronic small vessel ischemic  disease. Vascular: No hyperdense vessel. Skull: Negative for fracture or focal lesion. Sinuses/Orbits: Left maxillary mucous retention cyst. Otherwise clear paranasal sinuses. Status post bilateral lens replacements. Other: The mastoid air cells are well aerated. IMPRESSION: 1. Subarachnoid and subdural hemorrhage along the left middle cranial fossa, with the subdural component measuring approximately 4 mm. A small amount of hemorrhagic contusion is possible. 2. Small amount of hyperdensity in the fourth ventricle inferiorly may represent a small amount of intraventricular hemorrhage. No hydrocephalus. 3. Some hyperdensity is also seen around the optic chiasm on the left, which could represent additional hemorrhage. An MRI of the brain without contrast is recommended for further evaluation. These findings were discussed by telephone on 10/27/2023 at 4:58 pm with provider CHRISTOPHER TEGELER . Electronically Signed   By: Wiliam Ke M.D.   On: 10/27/2023 17:00    Review of Systems  Unable to perform ROS: Mental acuity    PE Blood pressure (!) 159/86, pulse 84, temperature 98.1 F (36.7 C), temperature source Oral, resp. rate 20, height 5\' 7"  (1.702 m), weight 90.7 kg, SpO2 98%. Constitutional: NAD; conversant; no deformities Eyes: Moist conjunctiva; no lid lag; anicteric; PERRL Neck: Trachea midline; no thyromegaly Lungs: Normal respiratory effort; no tactile fremitus CV: RRR; no palpable thrills; no pitting edema GI: Abd soft, NT; no palpable hepatosplenomegaly MSK: unable to assess gait; no clubbing/cyanosis Psychiatric: Appropriate affect; alert and oriented x3 Lymphatic: No palpable cervical or axillary lymphadenopathy Skin: No major subcutaneous nodules. Warm and dry   Assessment/Plan: 72 yo female with fall  SAH on eliquis - getting Andexxa now, NSG consulted T5 burst fx - MRI pending, log roll, admit to ICU R 6th rib - pain control, geriatric scaled, pulm toilet L5 disc bulge - was  following with Lovell Sheehan tomorrow, [place] aware  FEN- NPO VTE- SCDs ID- no issues Dispo- ICU   I reviewed last 24 h vitals and pain scores, last 48 h intake and output, last 24 h labs and trends, and last 24 h imaging results.  This care required high  level of medical decision making.   [name] 10/27/2023, 7:58 PM

## 2023-10-27 NOTE — ED Notes (Addendum)
Trauma Response Nurse Documentation   [name] is a 72 y.o. female arriving to [facility] ED via EMS  On Eliquis (apixaban) daily. Trauma was activated as a Level 2 by ED Charge RN based on the following trauma criteria Elderly patients > 65 with head trauma on anti-coagulation (excluding ASA).  Patient cleared for CT by Dr. Rush Landmark. Pt transported to CT with trauma response nurse present to monitor. RN remained with the patient throughout their absence from the department for clinical observation.   GCS 14.  History   Past Medical History:  Diagnosis Date   Anxiety    Atrial fibrillation [facility])    sees Dr. Flora Lipps   Bilateral swelling of feet    Cancer [facility])    breast   Chronic diarrhea    Chronic pain    Concussion 02/2007   ICU x 3 days   Depression    Hypertension    Hypothyroidism    Personal history of chemotherapy    Personal history of radiation therapy    Spinal stenosis    Thyroid disease ?1994   Vitamin D deficiency      Past Surgical History:  Procedure Laterality Date   BREAST BIOPSY Right 12/20/2019   x2   BREAST LUMPECTOMY Right 01/22/2020   BREAST LUMPECTOMY WITH RADIOACTIVE SEED AND SENTINEL LYMPH NODE BIOPSY Right 01/22/2020   Procedure: RIGHT BREAST LUMPECTOMY WITH RADIOACTIVE SEED X2 AND RIGHT SENTINEL LYMPH NODE MAPPING;  Surgeon: Harriette Bouillon, MD;  Location: [facility];  Service: General;  Laterality: Right;   BREAST SURGERY Right 03/1998   breast biopsy, benign   EYE SURGERY Right    PORT-A-CATH REMOVAL Right 04/30/2021   Procedure: REMOVAL PORT-A-CATH;  Surgeon: Harriette Bouillon, MD;  Location: [facility];  Service: General;  Laterality: Right;   PORTACATH PLACEMENT Right 01/22/2020   Procedure: INSERTION PORT-A-CATH WITH ULTRASOUND;  Surgeon: Harriette Bouillon, MD;  Location: [facility];  Service: General;  Laterality: Right;   PORTACATH PLACEMENT Right 02/28/2020   Procedure: PORT A CATH  REVISION;  Surgeon: Harriette Bouillon, MD;  [facility]: [facility];  Service: General;  Laterality: Right;   REVERSE SHOULDER ARTHROPLASTY Left 02/02/2021   Procedure: LEFT REVERSE SHOULDER ARTHROPLASTY;  Surgeon: Cammy Copa, MD;  Location: [facility];  Service: Orthopedics;  Laterality: Left;   SHOULDER SURGERY Right    SPINAL FUSION  03/04/2011   with ORIF     Initial Focused Assessment (If applicable, or please see trauma documentation): - Airway: intact, patent - Breathing: slightly SOB, lung sounds clear, equal bilaterally - Circulation: No uncontrolled hemorrhage, pulses intact centrally and peripherally  - PERRLA - Small hematoma to back of head - Pt has pre-existing lumbar fractures to L4-L5 that she is supposed to be seeing Dr. Lovell Sheehan for.   CT's Completed:   CT Head, CT C-Spine, CT Chest w/ contrast, and CT abdomen/pelvis w/ contrast   Interventions:  - 20G PIV to R AC - 20G PIV to R FA - Labs drawn - CXR - Pelvic XR - CT pan scan - fentanyl ordered  - MRI of head and thoracic spine ordered - Andexxa ordered for reversal of eliquis.   Plan for disposition:  Admission to Progressive Care more than likely.  Awaiting neurosurgery to see pt and also awaiting remaining CT scan results.   Consults completed:  Neurosurgeon paged at 1701.  Event Summary: Pt was BIB [facility] after they were dispatched to pt's house for a  lift assist.  Upon EMS arrival, they were asking questions that led them to believe she needed to come to the ED.  Pt had just fallen off of the couch and struck her head.  Pt is on eliquis for a-fib.  Pt endorses recent dizziness upon standing and had an orthostatic BP of 86/66 with EMS while standing.  Pt lives at home with her husband.  Pt has also had a decreased appetite for several days.  No LOC.   Bedside handoff with ED RN Madelynn.    Janora Norlander  Trauma Response RN  Please call TRN at [phone] for further assistance.

## 2023-10-28 ENCOUNTER — Other Ambulatory Visit: Payer: Self-pay

## 2023-10-28 ENCOUNTER — Inpatient Hospital Stay (HOSPITAL_COMMUNITY): Payer: Medicare PPO

## 2023-10-28 LAB — CBC
HCT: 38.5 % (ref 36.0–46.0)
Hemoglobin: 12.3 g/dL (ref 12.0–15.0)
MCH: 33.7 pg (ref 26.0–34.0)
MCHC: 31.9 g/dL (ref 30.0–36.0)
MCV: 105.5 fL — ABNORMAL HIGH (ref 80.0–100.0)
Platelets: 194 10*3/uL (ref 150–400)
RBC: 3.65 MIL/uL — ABNORMAL LOW (ref 3.87–5.11)
RDW: 13.5 % (ref 11.5–15.5)
WBC: 6.1 10*3/uL (ref 4.0–10.5)
nRBC: 0 % (ref 0.0–0.2)

## 2023-10-28 LAB — BASIC METABOLIC PANEL
Anion gap: 13 (ref 5–15)
BUN: 12 mg/dL (ref 8–23)
CO2: 35 mmol/L — ABNORMAL HIGH (ref 22–32)
Calcium: 7.7 mg/dL — ABNORMAL LOW (ref 8.9–10.3)
Chloride: 95 mmol/L — ABNORMAL LOW (ref 98–111)
Creatinine, Ser: 0.61 mg/dL (ref 0.44–1.00)
GFR, Estimated: >60 mL/min (ref >60)
Glucose, Bld: 126 mg/dL — ABNORMAL HIGH (ref 70–99)
Potassium: 3.2 mmol/L — ABNORMAL LOW (ref 3.5–5.1)
Sodium: 143 mmol/L (ref 135–145)

## 2023-10-28 MED ORDER — HYDROMORPHONE HCL 1 MG/ML IJ SOLN
1.0000 mg | INTRAMUSCULAR | Status: DC | PRN
  Administered 2023-10-28 – 2023-10-29 (×6): 1 mg via INTRAVENOUS
  Filled 2023-10-28 (×6): qty 1

## 2023-10-28 MED ORDER — LEVETIRACETAM 500 MG PO TABS
500.0000 mg | ORAL_TABLET | Freq: Two times a day (BID) | ORAL | Status: DC
  Administered 2023-10-28 – 2023-10-31 (×7): 500 mg via ORAL
  Filled 2023-10-28 (×7): qty 1

## 2023-10-28 MED ORDER — POTASSIUM CHLORIDE CRYS ER 20 MEQ PO TBCR
40.0000 meq | EXTENDED_RELEASE_TABLET | Freq: Once | ORAL | Status: AC
  Administered 2023-10-28: 40 meq via ORAL
  Filled 2023-10-28: qty 2

## 2023-10-28 NOTE — Plan of Care (Signed)
  Problem: Education: Goal: Knowledge of General Education information will improve Description: Including pain rating scale, medication(s)/side effects and non-pharmacologic comfort measures Outcome: Progressing   Problem: Clinical Measurements: Goal: Ability to maintain clinical measurements within normal limits will improve Outcome: Progressing Goal: Will remain free from infection Outcome: Progressing Goal: Diagnostic test results will improve Outcome: Progressing Goal: Respiratory complications will improve Outcome: Progressing Goal: Cardiovascular complication will be avoided Outcome: Progressing   Problem: Activity: Goal: Risk for activity intolerance will decrease Outcome: Progressing   Problem: Nutrition: Goal: Adequate nutrition will be maintained Outcome: Progressing   Problem: Pain Management: Goal: General experience of comfort will improve Outcome: Progressing   Problem: Safety: Goal: Ability to remain free from injury will improve Outcome: Progressing

## 2023-10-28 NOTE — Evaluation (Signed)
Speech Language Pathology Evaluation Patient Details Name: [name] MRN: [id number] DOB: Dec 01, 1951 Today's Date: 10/28/2023 Time: 4098-1191 SLP Time Calculation (min) (ACUTE ONLY): 12 min  Problem List:  Patient Active Problem List   Diagnosis Date Noted   Closed unstable burst fracture of T5 vertebra (HCC) 10/27/2023   Generalized anxiety disorder 11/21/2022   Alcohol abuse with alcohol-induced mood disorder (HCC) 11/21/2022   Alcohol abuse with alcohol-induced anxiety disorder (HCC) 11/21/2022   Pulmonary edema 11/18/2022   Acute respiratory failure with hypoxia and hypercapnia (HCC) 11/17/2022   Acute on chronic diastolic (congestive) heart failure (HCC) 11/17/2022   Arthritis of left shoulder region    S/P reverse total shoulder arthroplasty, 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   Vitamin D deficiency 01/15/2021   Vitamin B12 deficiency 01/15/2021   Primary insomnia 01/15/2021   Morbid obesity (HCC) 01/15/2021   Moderate recurrent major depression (HCC) 01/15/2021   Benzodiazepine dependence (HCC) 01/15/2021   Port-A-Cath in place 07/22/2020   Left leg cellulitis 04/17/2020   Cellulitis of left leg 04/17/2020   Hypokalemia 04/17/2020   Macrocytic anemia 04/17/2020   Anxiety 04/17/2020   Depression 04/17/2020   Chronic diarrhea 04/17/2020   Chemotherapy induced diarrhea 04/17/2020   Malignant neoplasm of upper-inner quadrant of right breast in female, estrogen receptor positive (HCC) 12/26/2019   Encephalopathy 02/02/2018   Hypothyroidism 02/02/2018   AKI (acute kidney injury) (HCC) 02/02/2018   Chronic back pain 02/02/2018   Alcohol abuse 02/02/2018   Weight gain 02/02/2018   Benign essential HTN 02/02/2018   Past Medical History:  Past Medical History:  Diagnosis Date   Anxiety    Atrial fibrillation [facility])    sees Dr. Flora Lipps   Bilateral swelling of feet    Cancer (HCC)    breast   Chronic diarrhea    Chronic pain    Concussion 02/2007   ICU x 3 days   Depression     Hypertension    Hypothyroidism    Personal history of chemotherapy    Personal history of radiation therapy    Spinal stenosis    Thyroid disease ?1994   Vitamin D deficiency    Past Surgical History:  Past Surgical History:  Procedure Laterality Date   BREAST BIOPSY Right 12/20/2019   x2   BREAST LUMPECTOMY Right 01/22/2020   BREAST LUMPECTOMY WITH RADIOACTIVE SEED AND SENTINEL LYMPH NODE BIOPSY Right 01/22/2020   Procedure: RIGHT BREAST LUMPECTOMY WITH RADIOACTIVE SEED X2 AND RIGHT SENTINEL LYMPH NODE MAPPING;  Surgeon: Harriette Bouillon, MD;  Location: [facility];  Service: General;  Laterality: Right;   BREAST SURGERY Right 03/1998   breast biopsy, benign   EYE SURGERY Right    PORT-A-CATH REMOVAL Right 04/30/2021   Procedure: REMOVAL PORT-A-CATH;  Surgeon: Harriette Bouillon, MD;  Location: [facility];  Service: General;  Laterality: Right;   PORTACATH PLACEMENT Right 01/22/2020   Procedure: INSERTION PORT-A-CATH WITH ULTRASOUND;  Surgeon: Harriette Bouillon, MD;  Location: [facility];  Service: General;  Laterality: Right;   PORTACATH PLACEMENT Right 02/28/2020   Procedure: PORT A CATH REVISION;  Surgeon: Harriette Bouillon, MD;  [facility];  Service: General;  Laterality: Right;   REVERSE SHOULDER ARTHROPLASTY Left 02/02/2021   Procedure: LEFT REVERSE SHOULDER ARTHROPLASTY;  Surgeon: Cammy Copa, MD;  [facility];  Service: Orthopedics;  Laterality: Left;   SHOULDER SURGERY Right    SPINAL FUSION  03/04/2011   with ORIF   HPI:  This is a 72 year old female with  chronic A-fib on Eliquis, who lost her balance and fell backward and hit her head.  Workup revealed left traumatic subarachnoid hemorrhage/contusion without significant mass effect.  There is also a T6 burst fracture with retropulsion and significant loss of height.   Assessment / Plan / Recommendation Clinical Impression  Cognitive-linguistic evaluation complete  with patient scoring WFL on all areas addressed. No further SLP needs indicated.    SLP Assessment  SLP Recommendation/Assessment: Patient does not need any further Speech Lanaguage Pathology Services SLP Visit Diagnosis: Cognitive communication deficit (R41.841)    Recommendations for follow up therapy are one component of a multi-disciplinary discharge planning process, led by the attending physician.  Recommendations may be updated based on patient status, additional functional criteria and insurance authorization.    Follow Up Recommendations  No SLP follow up       Functional Status Assessment Patient has not had a recent decline in their functional status        SLP Evaluation Cognition  Overall Cognitive Status: Within Functional Limits for tasks assessed Orientation Level: Oriented X4       Comprehension  Auditory Comprehension Overall Auditory Comprehension: Appears within functional limits for tasks assessed Visual Recognition/Discrimination Discrimination: Within Function Limits Reading Comprehension Reading Status: Within funtional limits    Expression Expression Primary Mode of Expression: Verbal Verbal Expression Overall Verbal Expression: Appears within functional limits for tasks assessed Written Expression Dominant Hand: Right Written Expression: Not tested   Oral / Motor  Oral Motor/Sensory Function Overall Oral Motor/Sensory Function: Within functional limits Motor Speech Overall Motor Speech: Appears within functional limits for tasks assessed           Ferdinand Lango MA, CCC-SLP  Courtney Bellizzi Meryl 10/28/2023, 11:30 AM

## 2023-10-28 NOTE — TOC CAGE-AID Note (Signed)
Transition of Care [facility]) - CAGE-AID Screening   Patient Details  Name: [name] MRN: [id number] Date of Birth: [birth year]  [facility]) CM/SW Contact:    Katha Hamming, RN Phone Number: 10/28/2023, 12:23 AM    CAGE-AID Screening:    Have You Ever Felt You Ought to Cut Down on Your Drinking or Drug Use?: No Have People Annoyed You By [employer] Your Drinking Or Drug Use?: No Have You Felt Bad Or Guilty About Your Drinking Or Drug Use?: No Have You Ever Had a Drink or Used Drugs First Thing In The Morning to Steady Your Nerves or to Get Rid of a Hangover?: No CAGE-AID Score: 0  Substance Abuse Education Offered: No

## 2023-10-28 NOTE — Progress Notes (Signed)
PT Cancellation Note  Patient Details Name: [name] MRN: [id number] DOB: 03/27/1951   Cancelled Treatment:    Reason Eval/Treat Not Completed: Medical issues which prohibited therapy;Patient not medically ready.   Active bedrest order (T6 burst fx hold due to neurosurgery updates pending , requires TLSO brace from home prior to any attempts)  10/28/2023  Jacinto Halim., PT Acute Rehabilitation Services [phone]  (office)   Eliseo Gum Roshawnda Pecora 10/28/2023, 5:19 PM

## 2023-10-28 NOTE — Progress Notes (Signed)
Trauma/Critical Care Follow Up Note  Subjective:    Overnight Issues:   Objective:  Vital signs for last 24 hours: Temp:  [97.6 F (36.4 C)-98.2 F (36.8 C)] 97.6 F (36.4 C) (11/08 0800) Pulse Rate:  [73-87] 74 (11/08 0900) Resp:  [10-24] 15 (11/08 0900) BP: (116-171)/(64-127) 151/74 (11/08 0900) SpO2:  [89 %-100 %] 96 % (11/08 0900) Weight:  [90.7 kg] 90.7 kg (11/07 1546)  Hemodynamic parameters for last 24 hours:    Intake/Output from previous day: 11/07 0701 - 11/08 0700 In: 100 [IV Piggyback:100] Out: 200 [Urine:200]  Intake/Output this shift: No intake/output data recorded.  Vent settings for last 24 hours:    Physical Exam:  Gen: comfortable, no distress Neuro: follows commands, alert, communicative HEENT: PERRL Neck: supple CV: RRR Pulm: unlabored breathing on RA Abd: soft, NT    GU: urine clear and yellow, +spontaneous voids Extr: wwp, no edema  Results for orders placed or performed during the hospital encounter of 10/27/23 (from the past 24 hour(s))  Troponin I (High Sensitivity)     Status: Abnormal   Collection Time: 10/27/23  3:49 PM  Result Value Ref Range   Troponin I (High Sensitivity) 38 (H) <18 ng/L  TSH     Status: None   Collection Time: 10/27/23  3:49 PM  Result Value Ref Range   TSH 2.575 0.350 - 4.500 uIU/mL  Brain natriuretic peptide     Status: Abnormal   Collection Time: 10/27/23  3:51 PM  Result Value Ref Range   B Natriuretic Peptide 237.0 (H) 0.0 - 100.0 pg/mL  Resp panel by RT-PCR (RSV, Flu A&B, Covid) Anterior Nasal Swab     Status: None   Collection Time: 10/27/23  4:12 PM   Specimen: Anterior Nasal Swab  Result Value Ref Range   SARS Coronavirus 2 by RT PCR NEGATIVE NEGATIVE   Influenza A by PCR NEGATIVE NEGATIVE   Influenza B by PCR NEGATIVE NEGATIVE   Resp Syncytial Virus by PCR NEGATIVE NEGATIVE  I-stat chem 8, ED (not at [facility], [facility] or [facility])     Status: Abnormal   Collection Time: 10/27/23  4:20 PM  Result Value  Ref Range   Sodium 141 135 - 145 mmol/L   Potassium 2.8 (L) 3.5 - 5.1 mmol/L   Chloride 92 (L) 98 - 111 mmol/L   BUN 20 8 - 23 mg/dL   Creatinine, Ser 6.21 0.44 - 1.00 mg/dL   Glucose, Bld 98 70 - 99 mg/dL   Calcium, Ion 3.08 (L) 1.15 - 1.40 mmol/L   TCO2 37 (H) 22 - 32 mmol/L   Hemoglobin 12.2 12.0 - 15.0 g/dL   HCT 65.7 84.6 - 96.2 %  Urinalysis, w/ Reflex to Culture (Infection Suspected) -Urine, Clean Catch     Status: Abnormal   Collection Time: 10/27/23  4:50 PM  Result Value Ref Range   Specimen Source URINE, CLEAN CATCH    Color, Urine YELLOW YELLOW   APPearance CLEAR CLEAR   Specific Gravity, Urine 1.017 1.005 - 1.030   pH 6.0 5.0 - 8.0   Glucose, UA NEGATIVE NEGATIVE mg/dL   Hgb urine dipstick NEGATIVE NEGATIVE   Bilirubin Urine NEGATIVE NEGATIVE   Ketones, ur NEGATIVE NEGATIVE mg/dL   Protein, ur NEGATIVE NEGATIVE mg/dL   Nitrite NEGATIVE NEGATIVE   Leukocytes,Ua NEGATIVE NEGATIVE   RBC / HPF 0-5 0 - 5 RBC/hpf   WBC, UA 0-5 0 - 5 WBC/hpf   Bacteria, UA RARE (A) NONE SEEN  Squamous Epithelial / HPF 0-5 0 - 5 /HPF   Mucus PRESENT    Hyaline Casts, UA PRESENT   Troponin I (High Sensitivity)     Status: Abnormal   Collection Time: 10/27/23  6:04 PM  Result Value Ref Range   Troponin I (High Sensitivity) 27 (H) <18 ng/L  MRSA Next Gen by PCR, Nasal     Status: None   Collection Time: 10/27/23  9:27 PM   Specimen: Nasal Mucosa; Nasal Swab  Result Value Ref Range   MRSA by PCR Next Gen NOT DETECTED NOT DETECTED  CBC     Status: Abnormal   Collection Time: 10/28/23  6:02 AM  Result Value Ref Range   WBC 6.1 4.0 - 10.5 K/uL   RBC 3.65 (L) 3.87 - 5.11 MIL/uL   Hemoglobin 12.3 12.0 - 15.0 g/dL   HCT 36.6 44.0 - 34.7 %   MCV 105.5 (H) 80.0 - 100.0 fL   MCH 33.7 26.0 - 34.0 pg   MCHC 31.9 30.0 - 36.0 g/dL   RDW 42.5 95.6 - 38.7 %   Platelets 194 150 - 400 K/uL   nRBC 0.0 0.0 - 0.2 %  Basic metabolic panel     Status: Abnormal   Collection Time: 10/28/23  6:02 AM   Result Value Ref Range   Sodium 143 135 - 145 mmol/L   Potassium 3.2 (L) 3.5 - 5.1 mmol/L   Chloride 95 (L) 98 - 111 mmol/L   CO2 35 (H) 22 - 32 mmol/L   Glucose, Bld 126 (H) 70 - 99 mg/dL   BUN 12 8 - 23 mg/dL   Creatinine, Ser 5.64 0.44 - 1.00 mg/dL   Calcium 7.7 (L) 8.9 - 10.3 mg/dL   GFR, Estimated >33 >29 mL/min   Anion gap 13 5 - 15    Assessment & Plan: The plan of care was discussed with the bedside nurse for the day, who is in agreement with this plan and no additional concerns were raised.   Present on Admission:  Closed unstable burst fracture of T5 vertebra (HCC)    LOS: 1 day   Additional comments:I reviewed the patient's new clinical lab test results.   and I reviewed the patients new imaging test results.    3F s/p fall   SAH, IVH - NSGY c/s, Dr. Jake Samples, repeat head CT reviewed, keppra x7d, SLP c/s AF on eliquis - rec'd Andexxa, hold for now Subacute T5 burst fx - MRI completed, likely will need surgery, but currently declining and also not planning to offer this admission. Being managed pre-admission by Dr. Lovell Sheehan. Plan brace to be brought in by husband. Dr. Lovell Sheehan to compare admission imaging with imaging done last week R 6th rib - pain control, pulm toilet  FEN- NPO VTE- SCDs ID- no issues Dispo- 4NP   [name], MD Trauma & General Surgery Please use [email or web address] to contact on call provider  10/28/2023  *Care during the described time interval was provided by me. I have reviewed this patient's available data, including medical history, events of note, physical examination and test results as part of my evaluation.

## 2023-10-28 NOTE — Progress Notes (Signed)
Subjective: The patient is alert and pleasant.  She complains of headache and back pain.  She denies lower extremity numbness, tingling, weakness, etc.  Objective: Vital signs in last 24 hours: Temp:  [97.9 F (36.6 C)-98.2 F (36.8 C)] 98.2 F (36.8 C) (11/08 0400) Pulse Rate:  [73-87] 77 (11/08 0600) Resp:  [10-24] 16 (11/08 0600) BP: (116-171)/(64-127) 169/96 (11/08 0600) SpO2:  [89 %-100 %] 100 % (11/08 0600) Weight:  [90.7 kg] 90.7 kg (11/07 1546) Estimated body mass index is 31.32 kg/m as calculated from the following:   Height as of this encounter: 5\' 7"  (1.702 m).   Weight as of this encounter: 90.7 kg.   Intake/Output from previous day: 11/07 0701 - 11/08 0700 In: 100 [IV Piggyback:100] Out: 200 [Urine:200] Intake/Output this shift: No intake/output data recorded.  Physical exam the patient is alert and oriented.  She is short of breath.  Her lower extremity strength is normal.  Her lower extremity sensation is normal.  I reviewed the patient's thoracic CT and thoracic MRI performed yesterday.  She has a severe T6 fracture with retropulsion and spinal stenosis.  Lab Results: Recent Labs    10/27/23 1620 10/28/23 0602  WBC  --  6.1  HGB 12.2 12.3  HCT 36.0 38.5  PLT  --  194   BMET Recent Labs    10/27/23 1620 10/28/23 0602  NA 141 143  K 2.8* 3.2*  CL 92* 95*  CO2  --  35*  GLUCOSE 98 126*  BUN 20 12  CREATININE 0.80 0.61  CALCIUM  --  7.7*    Studies/Results: CT HEAD WO CONTRAST ( )  Result Date: 10/28/2023 CLINICAL DATA:  72 year old female with intracranial hemorrhage following fall on blood thinners. T6 burst fracture. EXAM: CT HEAD WITHOUT CONTRAST TECHNIQUE: Contiguous axial images were obtained from the base of the skull through the vertex without intravenous contrast. RADIATION DOSE REDUCTION: This exam was performed according to the departmental dose-optimization program which includes automated exposure control, adjustment of the mA  and/or kV according to patient size and/or use of iterative reconstruction technique. COMPARISON:  Head CT and brain MRI yesterday. FINDINGS: Brain: Small volume subarachnoid hemorrhage in the left middle cranial fossa along the left MCA vessels is redemonstrated. Questionable component of small subdural there is well but less apparent now on series 4, image 24. Subarachnoid hemorrhage redemonstrated in the suprasellar cistern and increased since the CT yesterday, present on the MRI. Blood volume overall is small. Asymmetric involvement of the left ambient cistern again noted. Trace 4th ventricle IVH remains possible, and trace lateral IVH better demonstrated on SWI MRI. No midline shift or intracranial mass effect. No ventriculomegaly. Stable gray-white matter differentiation throughout the brain. No cortically based acute infarct identified. Vascular: Calcified atherosclerosis at the skull base. Skull: Minor motion artifact.  No skull fracture is identified. Sinuses/Orbits: Visualized paranasal sinuses and mastoids are stable and well aerated. Other: Vertex scalp hematoma is more apparent on series 3 image 81. No scalp soft tissue gas. Orbits soft tissues appears stable. IMPRESSION: 1. Small but increased volume of basilar cistern predominant Subarachnoid Hemorrhage, asymmetric to the left. Although posttraumatic etiology on blood thinners is possible, recommend either CTA or MRA of the intracranial circulation to exclude intracranial aneurysm. 2. Trace IVH better demonstrated on MRI. No intracranial mass effect, ventriculomegaly, or other complicating features. 3. Vertex scalp hematoma. No skull fracture identified. Electronically Signed   By: Odessa Fleming M.D.   On: 10/28/2023 05:10   MR THORACIC  SPINE WO CONTRAST  Result Date: 10/28/2023 CLINICAL DATA:  T6 fracture EXAM: MRI THORACIC SPINE WITHOUT CONTRAST TECHNIQUE: Multiplanar, multisequence MR imaging of the thoracic spine was performed. No intravenous  contrast was administered. COMPARISON:  None Available. FINDINGS: Alignment:  Physiologic. Vertebrae: There is a burst fracture at T6 with 75% height loss and 7 mm of retropulsion that severely narrows the spinal canal and impinges on the spinal cord. Mild T5 compression deformity with less than 25% height loss. Cord: Hyperintense T2-weighted signal within the spinal cord at the T5-7 levels. Paraspinal and other soft tissues: Negative. Disc levels: Severe spinal canal stenosis at T6 with impingement of the spinal cord, as above. Otherwise, there is no spinal canal stenosis. IMPRESSION: 1. Burst fracture at T6 with 75% height loss and 7 mm of retropulsion that severely narrows the spinal canal and impinges on the spinal cord. 2. Hyperintense T2-weighted signal within the spinal cord at the T5-7 levels, consistent with compressive myelopathy. 3. Mild T5 wedge compression fracture Electronically Signed   By: Deatra Robinson M.D.   On: 10/28/2023 00:21   MR BRAIN WO CONTRAST  Result Date: 10/28/2023 CLINICAL DATA:  Head trauma with abnormal head CT EXAM: MRI HEAD WITHOUT CONTRAST TECHNIQUE: Multiplanar, multiecho pulse sequences of the brain and surrounding structures were obtained without intravenous contrast. COMPARISON:  Head CT 10/27/2023 FINDINGS: Brain: There is no acute infarct. Acute blood products of the left middle cranial fossa, better seen on earlier head CT. Old left frontal infarct. Chronic microhemorrhage in the cerebellum. There is multifocal hyperintense T2-weighted signal within the white matter. Generalized volume loss. The midline structures are normal. Vascular: Normal flow voids. Skull and upper cervical spine: Normal marrow signal. Sinuses/Orbits: Negative. Other: None. IMPRESSION: 1. Acute blood products of the left middle cranial fossa, better seen on earlier head CT. 2. No acute infarct. 3. Old left frontal infarct and findings of chronic small vessel ischemia. 4. No new area of acute  hemorrhage identified. Electronically Signed   By: Deatra Robinson M.D.   On: 10/28/2023 00:17   CT T-SPINE NO CHARGE  Result Date: 10/27/2023 CLINICAL DATA:  Larey Seat backwards, hit head, back pain EXAM: CT Thoracic and Lumbar spine with contrast TECHNIQUE: Multiplanar CT images of the thoracic and lumbar spine were reconstructed from contemporary CT of the Chest, Abdomen, and Pelvis. RADIATION DOSE REDUCTION: This exam was performed according to the departmental dose-optimization program which includes automated exposure control, adjustment of the mA and/or kV according to patient size and/or use of iterative reconstruction technique. CONTRAST:  No additional COMPARISON:  04/08/2022, 07/08/2022, 11/17/2022 FINDINGS: CT THORACIC SPINE FINDINGS Alignment: Exaggerated kyphosis at a T6 compression fracture. Otherwise alignment is anatomic. Vertebrae: There is an acute compression fracture at the T6 level, with greater than 75% loss of vertebral body height. Burst component of the fracture, the burst type fracture with retropulsion, with the posterior aspect of the vertebral body protruding 0.7 cm at the central canal. Additionally, there is a transverse comminuted fracture through the left T6 facet joint, with no evidence of dislocation. There is invagination of the superior endplate of the T5 vertebral body, with less than 25% loss of height, also suspicious for an acute compression fracture. No retropulsion or extension to the posterior elements. Bones are osteopenic.  No other acute thoracic spine fractures. There are acute fractures of the right fifth and sixth ribs at the costovertebral junction, as well as a minimally displaced right posterolateral sixth rib fracture. Paraspinal and other soft tissues: Prominent  paraspinal soft tissue swelling from T5 through T7 associated with the acute thoracic spine fractures. No evidence of central canal hematoma. Trace right pleural effusion.  No evidence of pneumothorax.  Disc levels: As discussed above, there is approximately 50% narrowing of the central canal at the T6 level due to burst component of the T6 compression fracture. Further evaluation with MRI may be useful. There is mild spondylosis within the thoracic spine. No other bony encroachment upon the central canal or neural foramina. CT LUMBAR SPINE FINDINGS Segmentation: 5 lumbar type vertebrae. Alignment: Alignment is grossly anatomic. Vertebrae: No acute lumbar spine fractures. No destructive bony abnormalities. Stable postsurgical changes from discectomy and posterior fusion at the L4-5 level. Paraspinal and other soft tissues: Paraspinal soft tissues are unremarkable. Please refer to separately reported CT abdomen and pelvis exam for findings in those regions. Disc levels: Stable postsurgical changes from discectomy and posterior fusion at the L4-5 level. Streak artifact limits evaluation at those levels. Spondylosis and facet hypertrophy from L1-2 through L3-4 again noted and not appreciably changed, with findings most pronounced at the L3-4 level with central canal stenosis and left greater than right neural foraminal encroachment. Mild central disc bulge at L5-S1 is stable. Reconstructed images demonstrate no additional findings. IMPRESSION: 1. Acute T6 compression fracture, with greater than 75% loss of height. Fracture line extends through the left T6 facet, without evidence of dislocation or subluxation. Burst component of the T6 compression fracture, with retropulsion extending 0.7 cm into the central canal resulting in at least 50% narrowing and likely underlying cord compression. Further evaluation with MRI recommended. 2. Acute compression deformity superior endplate T5 vertebral body, with less than 25% loss of height. No retropulsion or extension to the posterior elements. 3. No acute lumbar spine fracture. 4. Multilevel thoracolumbar spondylosis and facet hypertrophy as above. 5. Stable postsurgical  changes from L4-5 discectomy and posterior fusion. Critical Value/emergent results were called by telephone at the time of interpretation on 10/27/2023 at 6:18 pm to provider [facility] , who verbally acknowledged these results. Electronically Signed   By: Sharlet Salina M.D.   On: 10/27/2023 18:21   CT L-SPINE NO CHARGE  Result Date: 10/27/2023 CLINICAL DATA:  Larey Seat backwards, hit head, back pain EXAM: CT Thoracic and Lumbar spine with contrast TECHNIQUE: Multiplanar CT images of the thoracic and lumbar spine were reconstructed from contemporary CT of the Chest, Abdomen, and Pelvis. RADIATION DOSE REDUCTION: This exam was performed according to the departmental dose-optimization program which includes automated exposure control, adjustment of the mA and/or kV according to patient size and/or use of iterative reconstruction technique. CONTRAST:  No additional COMPARISON:  04/08/2022, 07/08/2022, 11/17/2022 FINDINGS: CT THORACIC SPINE FINDINGS Alignment: Exaggerated kyphosis at a T6 compression fracture. Otherwise alignment is anatomic. Vertebrae: There is an acute compression fracture at the T6 level, with greater than 75% loss of vertebral body height. Burst component of the fracture, the burst type fracture with retropulsion, with the posterior aspect of the vertebral body protruding 0.7 cm at the central canal. Additionally, there is a transverse comminuted fracture through the left T6 facet joint, with no evidence of dislocation. There is invagination of the superior endplate of the T5 vertebral body, with less than 25% loss of height, also suspicious for an acute compression fracture. No retropulsion or extension to the posterior elements. Bones are osteopenic.  No other acute thoracic spine fractures. There are acute fractures of the right fifth and sixth ribs at the costovertebral junction, as well as a minimally  displaced right posterolateral sixth rib fracture. Paraspinal and other soft tissues:  Prominent paraspinal soft tissue swelling from T5 through T7 associated with the acute thoracic spine fractures. No evidence of central canal hematoma. Trace right pleural effusion.  No evidence of pneumothorax. Disc levels: As discussed above, there is approximately 50% narrowing of the central canal at the T6 level due to burst component of the T6 compression fracture. Further evaluation with MRI may be useful. There is mild spondylosis within the thoracic spine. No other bony encroachment upon the central canal or neural foramina. CT LUMBAR SPINE FINDINGS Segmentation: 5 lumbar type vertebrae. Alignment: Alignment is grossly anatomic. Vertebrae: No acute lumbar spine fractures. No destructive bony abnormalities. Stable postsurgical changes from discectomy and posterior fusion at the L4-5 level. Paraspinal and other soft tissues: Paraspinal soft tissues are unremarkable. Please refer to separately reported CT abdomen and pelvis exam for findings in those regions. Disc levels: Stable postsurgical changes from discectomy and posterior fusion at the L4-5 level. Streak artifact limits evaluation at those levels. Spondylosis and facet hypertrophy from L1-2 through L3-4 again noted and not appreciably changed, with findings most pronounced at the L3-4 level with central canal stenosis and left greater than right neural foraminal encroachment. Mild central disc bulge at L5-S1 is stable. Reconstructed images demonstrate no additional findings. IMPRESSION: 1. Acute T6 compression fracture, with greater than 75% loss of height. Fracture line extends through the left T6 facet, without evidence of dislocation or subluxation. Burst component of the T6 compression fracture, with retropulsion extending 0.7 cm into the central canal resulting in at least 50% narrowing and likely underlying cord compression. Further evaluation with MRI recommended. 2. Acute compression deformity superior endplate T5 vertebral body, with less than  25% loss of height. No retropulsion or extension to the posterior elements. 3. No acute lumbar spine fracture. 4. Multilevel thoracolumbar spondylosis and facet hypertrophy as above. 5. Stable postsurgical changes from L4-5 discectomy and posterior fusion. Critical Value/emergent results were called by telephone at the time of interpretation on 10/27/2023 at 6:18 pm to provider [facility] , who verbally acknowledged these results. Electronically Signed   By: Sharlet Salina M.D.   On: 10/27/2023 18:21   CT CERVICAL SPINE WO CONTRAST  Result Date: 10/27/2023 CLINICAL DATA:  Larey Seat, anticoagulated, hit head, back pain EXAM: CT CERVICAL SPINE WITHOUT CONTRAST TECHNIQUE: Multidetector CT imaging of the cervical spine was performed without intravenous contrast. Multiplanar CT image reconstructions were also generated. RADIATION DOSE REDUCTION: This exam was performed according to the departmental dose-optimization program which includes automated exposure control, adjustment of the mA and/or kV according to patient size and/or use of iterative reconstruction technique. COMPARISON:  12/12/2020 FINDINGS: The study is slightly limited by patient motion during the exam. Alignment: Alignment is grossly anatomic. Skull base and vertebrae: No acute fracture. No primary bone lesion or focal pathologic process. Soft tissues and spinal canal: No prevertebral fluid or swelling. No visible canal hematoma. Disc levels: Mild multilevel spondylosis from C3-4 through C6-7. Mild right neural foraminal encroachment at C5-6. Upper chest: Airway is patent. Emphysematous changes are seen at the lung apices. Other: Reconstructed images demonstrate no additional findings. IMPRESSION: 1. No acute cervical spine fracture. 2. Mild cervical spondylosis greatest at C5-6. Electronically Signed   By: Sharlet Salina M.D.   On: 10/27/2023 18:05   CT CHEST ABDOMEN PELVIS W CONTRAST  Result Date: 10/27/2023 CLINICAL DATA:  Larey Seat backwards and  hit her head. Patient on blood thinners. EXAM: CT CHEST, ABDOMEN, AND  PELVIS WITH CONTRAST TECHNIQUE: Multidetector CT imaging of the chest, abdomen and pelvis was performed following the standard protocol during bolus administration of intravenous contrast. RADIATION DOSE REDUCTION: This exam was performed according to the departmental dose-optimization program which includes automated exposure control, adjustment of the mA and/or kV according to patient size and/or use of iterative reconstruction technique. CONTRAST:  75mL OMNIPAQUE IOHEXOL 350 MG/ML SOLN COMPARISON:  CT angiogram chest 11/17/2022. Chest abdomen and pelvis 04/08/2021. FINDINGS: CT CHEST FINDINGS Cardiovascular: Heart is nonenlarged. Trace pericardial fluid. Coronary artery calcifications are seen. The thoracic aorta has a normal course and caliber. No mediastinal hematoma. There is slight pulsation artifact along the ascending aorta. Mediastinum/Nodes: Normal caliber thoracic esophagus. Small thyroid gland. Small nodes identified in the axillary regions, hilum or mediastinum. Lungs/Pleura: No consolidation, pneumothorax or effusion. There is some dependent atelectasis. There is scattered areas of scarring and atelectatic changes. Musculoskeletal: Surgical changes along the right breast. Significant streak artifact related to the patient's left shoulder reverse arthroplasty. There is acute mildly displaced fracture of the posterior aspect of the right sixth rib. There is severe compression of the vertebral body at T5 with displaced aspect of the posterior wall towards the canal centrally with stenosis. Mild compression of T4. These changes were not seen on the CT scan of 2023. Please see separate spine CT scan from same date. An acute process is possible. CT ABDOMEN PELVIS FINDINGS Hepatobiliary: Dilated gallbladder with possible stones versus mass lesion. Mild ectasia of the biliary tree. No space-occupying liver lesion. Preserved hepatic  enhancement. Patent portal vein. Pancreas: Unremarkable. No pancreatic ductal dilatation or surrounding inflammatory changes. Spleen: Normal in size without focal abnormality. Adrenals/Urinary Tract: Right adrenal gland is preserved. There is a left adrenal nodule measuring 2.2 cm. Unchanged from previous examination. This has been stable since at least April 2022 demonstrating over 2 years of stability. Slightly malrotated right kidney. No enhancing renal mass or collecting system dilatation. The ureters have normal course and caliber extending down to the bladder. Preserved contours of the urinary bladder. Stomach/Bowel: On this non oral contrast exam, large bowel has a normal caliber. Redundant course of the sigmoid colon. Scattered stool with some scattered left-sided colonic diverticula. Normal appendix in the right lower quadrant. Stomach and small bowel are nondilated. Vascular/Lymphatic: Aortic atherosclerosis. No enlarged abdominal or pelvic lymph nodes. Reproductive: Uterus and bilateral adnexa are unremarkable. Other: No free air or free fluid. Musculoskeletal: Scattered degenerative changes along the lumbar spine. There is streak artifact related to the posterior fusion hardware at L4-5. Multilevel disc bulging and posterior osteophytes with canal encroachment. IMPRESSION: No consolidation, pneumothorax or effusion. Severe compression deformity of T5 and mild at T4. These new from CT study of 2023. Please correlate for an acute compression. There is a associated canal encroachment at T5. Acute right sixth rib fracture. No bowel obstruction, free air or free fluid. No evidence of acute solid organ injury. Colonic diverticula. Dilated gallbladder with dependent high density material. Favor gallstones. These were seen on prior CT. Stable left adrenal nodule going back to 2022 demonstrating over 2 years of stability. Electronically Signed   By: Karen Kays M.D.   On: 10/27/2023 18:04   DG Chest Port 1  View  Result Date: 10/27/2023 CLINICAL DATA:  Trauma. EXAM: PORTABLE CHEST 1 VIEW COMPARISON:  Chest radiograph dated 11/17/2022 and CT dated 11/17/2022. FINDINGS: Mild cardiomegaly with mild central vascular congestion. No focal consolidation, pleural effusion or pneumothorax. No acute osseous pathology. Left shoulder arthroplasty. IMPRESSION: Mild cardiomegaly  with mild central vascular congestion. Electronically Signed   By: Elgie Collard M.D.   On: 10/27/2023 17:26   CT HEAD WO CONTRAST  Result Date: 10/27/2023 CLINICAL DATA:  Head trauma, moderate to severe EXAM: CT HEAD WITHOUT CONTRAST TECHNIQUE: Contiguous axial images were obtained from the base of the skull through the vertex without intravenous contrast. RADIATION DOSE REDUCTION: This exam was performed according to the departmental dose-optimization program which includes automated exposure control, adjustment of the mA and/or kV according to patient size and/or use of iterative reconstruction technique. COMPARISON:  12/12/2020 FINDINGS: Brain: Subarachnoid and subdural hemorrhage along the left middle cranial fossa, with the subdural component measuring approximately 4 mm. A small amount of hemorrhagic contusion is possible. Small amount of hyperdensity in the fourth ventricle inferiorly (series 6, image 39) may represent a small amount of intraventricular hemorrhage. Some hyperdensity is also seen around the optic chiasm on the left. No evidence of acute infarct, mass, mass effect, or midline shift. No hydrocephalus. Periventricular white matter changes, likely the sequela of chronic small vessel ischemic disease. Vascular: No hyperdense vessel. Skull: Negative for fracture or focal lesion. Sinuses/Orbits: Left maxillary mucous retention cyst. Otherwise clear paranasal sinuses. Status post bilateral lens replacements. Other: The mastoid air cells are well aerated. IMPRESSION: 1. Subarachnoid and subdural hemorrhage along the left middle  cranial fossa, with the subdural component measuring approximately 4 mm. A small amount of hemorrhagic contusion is possible. 2. Small amount of hyperdensity in the fourth ventricle inferiorly may represent a small amount of intraventricular hemorrhage. No hydrocephalus. 3. Some hyperdensity is also seen around the optic chiasm on the left, which could represent additional hemorrhage. An MRI of the brain without contrast is recommended for further evaluation. These findings were discussed by telephone on 10/27/2023 at 4:58 pm with provider CHRISTOPHER TEGELER . Electronically Signed   By: Wiliam Ke M.D.   On: 10/27/2023 17:00    Assessment/Plan: T6 fracture, traumatic subarachnoid hemorrhage, anticoagulation: I have discussed the situation with the patient.  I have explained she has a significant fracture with pressure on her spinal cord.  Surgical treatment of this problem will be difficult in her because I do not think she would do well with posterior surgery because her pedicles are so small, she has osteoporosis and the neural compression it is ventral.  The better surgical option would be a thoracotomy which would be quite difficult on her and certainly have significant risks.  I am not sure she could tolerate the surgery.  We discussed alternative treatment of continue in the TLSO brace that was provided when she was in the hospital in [place].  She has not had any neurologic progression in about a week and even after a fall.  She had an appointment to see me in the office today and I have asked her to have her daughter drop off the CD with the images from [place].  I will see if there is any progression in her fracture.  If not I think the best course is to continue in a TLSO.  I have answered her questions.  If she were to develop neurologic symptoms, i.e. lower extremity numbness tingling weakness, etc. we may have to change plans.   LOS: 1 day     Cristi Loron 10/28/2023, 8:02  AM

## 2023-10-28 NOTE — Progress Notes (Addendum)
OT Cancellation Note  Patient Details Name: [name] MRN: [id number] DOB: 11-Oct-1951   Cancelled Treatment:    Reason Eval/Treat Not Completed: Active bedrest order (T6 burst fx hold due to neurosurgery updates pending , requires TLSO brace from home prior to any attempts)  Mateo Flow 10/28/2023, 7:35 AM

## 2023-10-29 LAB — BASIC METABOLIC PANEL
Anion gap: 8 (ref 5–15)
BUN: 11 mg/dL (ref 8–23)
CO2: 36 mmol/L — ABNORMAL HIGH (ref 22–32)
Calcium: 8.5 mg/dL — ABNORMAL LOW (ref 8.9–10.3)
Chloride: 92 mmol/L — ABNORMAL LOW (ref 98–111)
Creatinine, Ser: 0.42 mg/dL — ABNORMAL LOW (ref 0.44–1.00)
GFR, Estimated: >60 mL/min (ref >60)
Glucose, Bld: 150 mg/dL — ABNORMAL HIGH (ref 70–99)
Potassium: 3.4 mmol/L — ABNORMAL LOW (ref 3.5–5.1)
Sodium: 136 mmol/L (ref 135–145)

## 2023-10-29 LAB — PHOSPHORUS: Phosphorus: 3.2 mg/dL (ref 2.5–4.6)

## 2023-10-29 LAB — MAGNESIUM: Magnesium: 1.8 mg/dL (ref 1.7–2.4)

## 2023-10-29 MED ORDER — MIDAZOLAM HCL 2 MG/2ML IJ SOLN
2.0000 mg | INTRAMUSCULAR | Status: DC | PRN
  Administered 2023-10-30 (×2): 2 mg via INTRAVENOUS
  Filled 2023-10-29 (×2): qty 2

## 2023-10-29 MED ORDER — SERTRALINE HCL 50 MG PO TABS
50.0000 mg | ORAL_TABLET | Freq: Every day | ORAL | Status: DC
  Administered 2023-10-29 – 2023-10-31 (×3): 50 mg via ORAL
  Filled 2023-10-29 (×3): qty 1

## 2023-10-29 MED ORDER — ALPRAZOLAM 0.25 MG PO TABS: 0.2500 mg | ORAL_TABLET | Freq: Three times a day (TID) | ORAL | Status: DC | PRN

## 2023-10-29 MED ORDER — HYDRALAZINE HCL 20 MG/ML IJ SOLN
10.0000 mg | INTRAMUSCULAR | Status: DC | PRN
  Administered 2023-10-29 – 2023-11-04 (×15): 10 mg via INTRAVENOUS
  Filled 2023-10-29 (×14): qty 1

## 2023-10-29 MED ORDER — BUSPIRONE HCL 15 MG PO TABS
15.0000 mg | ORAL_TABLET | Freq: Two times a day (BID) | ORAL | Status: DC
  Administered 2023-10-29 – 2023-10-31 (×4): 15 mg via ORAL
  Filled 2023-10-29 (×3): qty 1

## 2023-10-29 MED ORDER — POTASSIUM CHLORIDE CRYS ER 20 MEQ PO TBCR
40.0000 meq | EXTENDED_RELEASE_TABLET | Freq: Once | ORAL | Status: AC
  Administered 2023-10-29: 40 meq via ORAL
  Filled 2023-10-29: qty 2

## 2023-10-29 MED ORDER — HYDROMORPHONE HCL 1 MG/ML IJ SOLN
0.5000 mg | INTRAMUSCULAR | Status: DC | PRN
  Administered 2023-10-30 (×3): 0.5 mg via INTRAVENOUS
  Filled 2023-10-29 (×3): qty 1

## 2023-10-29 MED ORDER — LACTATED RINGERS IV BOLUS
500.0000 mL | Freq: Once | INTRAVENOUS | Status: AC
  Administered 2023-10-29: 500 mL via INTRAVENOUS

## 2023-10-29 NOTE — Progress Notes (Signed)
Patient ID: [name], female   DOB: Jan 09, 1951, 71 y.o.   MRN: [id number] BP (!) 147/71   Pulse 83   Temp 97.8 F (36.6 C) (Oral)   Resp 20   Ht 5\' 7"  (1.702 m)   Wt 90.7 kg   LMP  (LMP Unknown)   SpO2 96%   BMI 31.32 kg/m  Head CT shows little change Films from [place] given to radiology I went over the MRI, and CT and explained why we would much prefer her healing in situ. Normal neurologically at this time. Very anxious And complaining of headache and back pain. Maintain on bedrest today.

## 2023-10-29 NOTE — Progress Notes (Signed)
Pt w/ fluctuating level of attention, talking in sleep, yelling when BP cuff squeezing arm, BPs elevated, pt unable to void. MD notified, orders received.

## 2023-10-29 NOTE — Progress Notes (Signed)
Patient ID: [name], female   DOB: 1951/04/11, 72 y.o.   MRN: [id number]      Subjective: She is concerned about her back FX Had a brief episode where "it looked like the TV was on the floor" ROS negative except as listed above. Objective: Vital signs in last 24 hours: Temp:  [97.7 F (36.5 C)-98.4 F (36.9 C)] 97.8 F (36.6 C) (11/09 0745) Pulse Rate:  [70-83] 78 (11/09 0800) Resp:  [12-19] 14 (11/09 0800) BP: (142-186)/(66-99) 165/78 (11/09 0800) SpO2:  [95 %-100 %] 96 % (11/09 0800) Last BM Date :  (PTA)  Intake/Output from previous day: 11/08 0701 - 11/09 0700 In: 90 [P.O.:90] Out: 500 [Urine:500] Intake/Output this shift: No intake/output data recorded.  General appearance: alert and cooperative Resp: clear to auscultation bilaterally GI: soft, NT Neuro: F/C MAE  Lab Results: CBC  Recent Labs    10/27/23 1620 10/28/23 0602  WBC  --  6.1  HGB 12.2 12.3  HCT 36.0 38.5  PLT  --  194   BMET Recent Labs    10/28/23 0602 10/29/23 0536  NA 143 136  K 3.2* 3.4*  CL 95* 92*  CO2 35* 36*  GLUCOSE 126* 150*  BUN 12 11  CREATININE 0.61 0.42*  CALCIUM 7.7* 8.5*   PT/INR No results for input(s): "LABPROT", "INR" in the last 72 hours. ABG No results for input(s): "PHART", "HCO3" in the last 72 hours.  Invalid input(s): "PCO2", "PO2"  Studies/Results:   Anti-infectives: Anti-infectives (From admission, onward)    None       Assessment/Plan: 32F s/p fall   SAH, IVH - NSGY c/s, Dr. Jake Samples, repeat head CT reviewed, keppra x7d, SLP c/s AF on eliquis - rec'd Andexxa, hold for now Subacute T5 burst fx - MRI completed, Being managed pre-admission by Dr. Lovell Sheehan. Plan brace to be brought in by husband. Neurosurgery to compare admission imaging with imaging done last week R 6th rib - pain control, pulm toilet  FEN- reg diet VTE- SCDs ID- no issues Dispo- to 4NP    LOS: 2 days    [name], MD, MPH, FACS Trauma & General Surgery Use  [email or web address] to contact on call provider  10/29/2023

## 2023-10-29 NOTE — Progress Notes (Signed)
Pt with only 250 urine output, poor po intake. MD notified, LR bolus given.

## 2023-10-29 NOTE — Progress Notes (Signed)
Pt unable to void, strict spinal precautions with confusion and difficult access for I&O. Discussed with charge RN and MD, foley placed per order.

## 2023-10-30 ENCOUNTER — Inpatient Hospital Stay (HOSPITAL_COMMUNITY): Payer: Medicare PPO

## 2023-10-30 LAB — POCT I-STAT 7, (LYTES, BLD GAS, ICA,H+H)
Acid-Base Excess: 9 mmol/L — ABNORMAL HIGH (ref 0.0–2.0)
Bicarbonate: 38.1 mmol/L — ABNORMAL HIGH (ref 20.0–28.0)
Calcium, Ion: 1.25 mmol/L (ref 1.15–1.40)
HCT: 37 % (ref 36.0–46.0)
Hemoglobin: 12.6 g/dL (ref 12.0–15.0)
O2 Saturation: 100 %
Potassium: 3.6 mmol/L (ref 3.5–5.1)
Sodium: 139 mmol/L (ref 135–145)
TCO2: 40 mmol/L — ABNORMAL HIGH (ref 22–32)
pCO2 arterial: 77.5 mmHg (ref 32–48)
pH, Arterial: 7.299 — ABNORMAL LOW (ref 7.35–7.45)
pO2, Arterial: 332 mmHg — ABNORMAL HIGH (ref 83–108)

## 2023-10-30 LAB — BASIC METABOLIC PANEL
Anion gap: 10 (ref 5–15)
BUN: 10 mg/dL (ref 8–23)
CO2: 36 mmol/L — ABNORMAL HIGH (ref 22–32)
Calcium: 8.8 mg/dL — ABNORMAL LOW (ref 8.9–10.3)
Chloride: 94 mmol/L — ABNORMAL LOW (ref 98–111)
Creatinine, Ser: 0.44 mg/dL (ref 0.44–1.00)
GFR, Estimated: >60 mL/min (ref >60)
Glucose, Bld: 129 mg/dL — ABNORMAL HIGH (ref 70–99)
Potassium: 3.6 mmol/L (ref 3.5–5.1)
Sodium: 140 mmol/L (ref 135–145)

## 2023-10-30 LAB — CBC
HCT: 39.6 % (ref 36.0–46.0)
Hemoglobin: 12.3 g/dL (ref 12.0–15.0)
MCH: 33.8 pg (ref 26.0–34.0)
MCHC: 31.1 g/dL (ref 30.0–36.0)
MCV: 108.8 fL — ABNORMAL HIGH (ref 80.0–100.0)
Platelets: 225 10*3/uL (ref 150–400)
RBC: 3.64 MIL/uL — ABNORMAL LOW (ref 3.87–5.11)
RDW: 13.6 % (ref 11.5–15.5)
WBC: 9.2 10*3/uL (ref 4.0–10.5)
nRBC: 0 % (ref 0.0–0.2)

## 2023-10-30 MED ORDER — LEVOTHYROXINE SODIUM 75 MCG PO TABS
150.0000 ug | ORAL_TABLET | Freq: Every day | ORAL | Status: DC
Start: 2023-10-31 — End: 2023-11-02
  Administered 2023-10-31 – 2023-11-01 (×2): 150 ug via ORAL
  Filled 2023-10-30 (×3): qty 2

## 2023-10-30 MED ORDER — TRAZODONE HCL 50 MG PO TABS
100.0000 mg | ORAL_TABLET | Freq: Every day | ORAL | Status: DC
  Administered 2023-10-30 – 2023-10-31 (×2): 100 mg via ORAL
  Filled 2023-10-30 (×2): qty 2

## 2023-10-30 MED ORDER — ALBUMIN HUMAN 5 % IV SOLN
12.5000 g | Freq: Once | INTRAVENOUS | Status: AC
  Administered 2023-10-30: 12.5 g via INTRAVENOUS
  Filled 2023-10-30: qty 250

## 2023-10-30 MED ORDER — HALOPERIDOL LACTATE 5 MG/ML IJ SOLN
5.0000 mg | Freq: Four times a day (QID) | INTRAMUSCULAR | Status: DC | PRN
  Administered 2023-10-30: 5 mg via INTRAVENOUS
  Filled 2023-10-30: qty 1

## 2023-10-30 MED ORDER — LOSARTAN POTASSIUM 50 MG PO TABS
100.0000 mg | ORAL_TABLET | Freq: Every day | ORAL | Status: DC
  Administered 2023-10-30 – 2023-10-31 (×2): 100 mg via ORAL
  Filled 2023-10-30 (×2): qty 2

## 2023-10-30 MED ORDER — METHOCARBAMOL 500 MG PO TABS
500.0000 mg | ORAL_TABLET | Freq: Three times a day (TID) | ORAL | Status: DC
  Administered 2023-10-30: 500 mg via ORAL
  Filled 2023-10-30 (×2): qty 1

## 2023-10-30 MED ORDER — SODIUM CHLORIDE 0.9 % IV SOLN: INTRAVENOUS | Status: DC

## 2023-10-30 MED ORDER — CARVEDILOL 12.5 MG PO TABS
25.0000 mg | ORAL_TABLET | Freq: Two times a day (BID) | ORAL | Status: DC
  Administered 2023-10-30 – 2023-10-31 (×3): 25 mg via ORAL
  Filled 2023-10-30 (×5): qty 2

## 2023-10-30 MED ORDER — DEXMEDETOMIDINE HCL IN NACL 400 MCG/100ML IV SOLN
0.0000 ug/kg/h | INTRAVENOUS | Status: DC
  Administered 2023-10-30: 0.4 ug/kg/h via INTRAVENOUS
  Administered 2023-10-31 (×3): 1 ug/kg/h via INTRAVENOUS
  Administered 2023-10-31: 0.7 ug/kg/h via INTRAVENOUS
  Administered 2023-11-01 (×2): 1 ug/kg/h via INTRAVENOUS
  Administered 2023-11-01 (×2): 0.8 ug/kg/h via INTRAVENOUS
  Administered 2023-11-02 (×2): 1 ug/kg/h via INTRAVENOUS
  Administered 2023-11-02: 1.2 ug/kg/h via INTRAVENOUS
  Filled 2023-10-30: qty 200
  Filled 2023-10-30 (×3): qty 100
  Filled 2023-10-30: qty 200
  Filled 2023-10-30 (×6): qty 100

## 2023-10-30 MED ORDER — METHOCARBAMOL 1000 MG/10ML IJ SOLN
500.0000 mg | Freq: Three times a day (TID) | INTRAMUSCULAR | Status: DC
  Administered 2023-10-31: 500 mg via INTRAVENOUS
  Filled 2023-10-30: qty 10

## 2023-10-30 MED ORDER — SALINE SPRAY 0.65 % NA SOLN
1.0000 | NASAL | Status: DC | PRN
  Administered 2023-10-30: 1 via NASAL
  Filled 2023-10-30: qty 44

## 2023-10-30 MED ORDER — FUROSEMIDE 10 MG/ML IJ SOLN
40.0000 mg | Freq: Once | INTRAMUSCULAR | Status: AC
  Administered 2023-10-30: 40 mg via INTRAVENOUS
  Filled 2023-10-30: qty 4

## 2023-10-30 MED ORDER — POTASSIUM CHLORIDE IN NACL 20-0.9 MEQ/L-% IV SOLN
INTRAVENOUS | Status: DC
  Filled 2023-10-30 (×3): qty 1000

## 2023-10-30 MED ORDER — MORPHINE SULFATE (PF) 4 MG/ML IV SOLN
4.0000 mg | INTRAVENOUS | Status: DC | PRN
  Administered 2023-10-30 – 2023-11-02 (×8): 4 mg via INTRAVENOUS
  Filled 2023-10-30 (×10): qty 1

## 2023-10-30 MED ORDER — DULOXETINE HCL 60 MG PO CPEP
60.0000 mg | ORAL_CAPSULE | Freq: Two times a day (BID) | ORAL | Status: DC
  Administered 2023-10-30 – 2023-11-02 (×4): 60 mg via ORAL
  Filled 2023-10-30 (×4): qty 1

## 2023-10-30 MED ORDER — HYDROCODONE-ACETAMINOPHEN 5-325 MG PO TABS
1.0000 | ORAL_TABLET | Freq: Four times a day (QID) | ORAL | Status: DC | PRN
  Administered 2023-10-30: 1 via ORAL
  Filled 2023-10-30: qty 1

## 2023-10-30 NOTE — Progress Notes (Deleted)
RN gave 0.5 Dilaudid IV and attempted placement of NG tube.  Patient became immediately agitated and combative.  NG tube placement was aborted and MD was notified.

## 2023-10-30 NOTE — Progress Notes (Signed)
Patient ID: [name], female   DOB: [birth year], 72 y.o.   MRN: [id number] BP (!) 183/75 Comment: gave prn lopressor  Pulse 91   Temp (!) 97.4 F (36.3 C) (Axillary)   Resp 18   Ht 5\' 7"  (1.702 m)   Wt 90.7 kg   LMP  (LMP Unknown)   SpO2 91%   BMI 31.32 kg/m  Alert following all commands Comparing the thoracic MRI's , certainly more angulation at this time No change in motor or sensory exam Continue on bedrest 5/5 strength lower extremities

## 2023-10-30 NOTE — Progress Notes (Signed)
RN alerted Trauma MD that patient kept desatting whenever she wuld fremove her nasal cannula.  Orders were given to obtain an ABG and Chest xray.  Subsequent orders were given to give Lasix and start BiPap

## 2023-10-30 NOTE — Progress Notes (Addendum)
..  Trauma Event Note    Reason for Call : Call from primary RN [name] regarding pt's confusion, agitation and hypertension. Pt not able to hold a coherent conversation at this time. In review of pts chart, hx of etoh w/d with previous admissions. This RN spoke with pts husband [name], he reports pt is a heavy drinker, in fact he was ask after this fall she ask him to locate and dispose of hidden empty wine bottles in her home, he reports she has failed AA and this and her previous falls were related to alcohol use. He apologized for not divulging information before now and would not like this discussed with pts daughter at this time.  Dr. Hillery Hunter contacted, CIWA's and Precedex ordered.  POC discussed with primary RN.   Last imported Vital Signs BP (!) 171/99 (BP Location: Left Arm)   Pulse 90   Temp 98.4 F (36.9 C) (Axillary)   Resp (!) 22   Ht 5\' 7"  (1.702 m)   Wt 200 lb (90.7 kg)   LMP  (LMP Unknown)   SpO2 100%   BMI 31.32 kg/m   Trending CBC Recent Labs    10/28/23 0602 10/30/23 0519 10/30/23 1654  WBC 6.1 9.2  --   HGB 12.3 12.3 12.6  HCT 38.5 39.6 37.0  PLT 194 225  --     Trending Coag's No results for input(s): "APTT", "INR" in the last 72 hours.  Trending BMET Recent Labs    10/28/23 0602 10/29/23 0536 10/30/23 0519 10/30/23 1654  NA 143 136 140 139  K 3.2* 3.4* 3.6 3.6  CL 95* 92* 94*  --   CO2 35* 36* 36*  --   BUN 12 11 10   --   CREATININE 0.61 0.42* 0.44  --   GLUCOSE 126* 150* 129*  --       [name] Dee  Trauma Response RN  Please call TRN at [phone] for further assistance.

## 2023-10-30 NOTE — Progress Notes (Signed)
Patient pulling off bipap mask. Placed patient on NRB for short period and will place patient back on bipap shortly

## 2023-10-30 NOTE — Progress Notes (Signed)
After repeated unsuccessful attempts to calm patient down using verbal de-escalation alerted MD that patient kept screaming out that "she could not stay flat any longer and someone needed to take her home."  MD suggested that RN use prn versed.  Drug was administered.

## 2023-10-30 NOTE — Progress Notes (Signed)
Patient ID: [name], female   DOB: 1951-04-02, 72 y.o.   MRN: [id number] Follow up - Trauma Critical Care   Patient Details:    [name] is an 72 y.o. female.  Lines/tubes : Implanted Port 09/02/20 (Active)     Urethral Catheter A Duffy Rhody RN Non-latex 14 Fr. (Active)  Indication for Insertion or Continuance of Catheter Unstable spinal/crush injuries / Multisystem Trauma 10/30/23 0749  Site Assessment Clean, Dry, Intact 10/30/23 0749  Catheter Maintenance Bag below level of bladder;Catheter secured;Drainage bag/tubing not touching floor;Insertion date on drainage bag;No dependent loops;Seal intact 10/30/23 0749  Collection Container Standard drainage bag 10/30/23 0749  Securement Method Adhesive securement device 10/30/23 0749  Output (mL) 25 mL 10/30/23 0900    Microbiology/Sepsis markers: Results for orders placed or performed during the hospital encounter of 10/27/23  Resp panel by RT-PCR (RSV, Flu A&B, Covid) Anterior Nasal Swab     Status: None   Collection Time: 10/27/23  4:12 PM   Specimen: Anterior Nasal Swab  Result Value Ref Range Status   SARS Coronavirus 2 by RT PCR NEGATIVE NEGATIVE Final   Influenza A by PCR NEGATIVE NEGATIVE Final   Influenza B by PCR NEGATIVE NEGATIVE Final    Comment: (NOTE) The Xpert Xpress SARS-CoV-2/FLU/RSV plus assay is intended as an aid in the diagnosis of influenza from Nasopharyngeal swab specimens and should not be used as a sole basis for treatment. Nasal washings and aspirates are unacceptable for Xpert Xpress SARS-CoV-2/FLU/RSV testing.  Fact Sheet for Patients: BloggerCourse.com  Fact Sheet for Healthcare Providers: SeriousBroker.it  This test is not yet approved or cleared by the [place] FDA and has been authorized for detection and/or diagnosis of SARS-CoV-2 by FDA under an Emergency Use Authorization (EUA). This EUA will remain in effect (meaning this  test can be used) for the duration of the COVID-19 declaration under Section 564(b)(1) of the Act, 21 U.S.C. section 360bbb-3(b)(1), unless the authorization is terminated or revoked.     Resp Syncytial Virus by PCR NEGATIVE NEGATIVE Final    Comment: (NOTE) Fact Sheet for Patients: BloggerCourse.com  Fact Sheet for Healthcare Providers: SeriousBroker.it  This test is not yet approved or cleared by the [place] FDA and has been authorized for detection and/or diagnosis of SARS-CoV-2 by FDA under an Emergency Use Authorization (EUA). This EUA will remain in effect (meaning this test can be used) for the duration of the COVID-19 declaration under Section 564(b)(1) of the Act, 21 U.S.C. section 360bbb-3(b)(1), unless the authorization is terminated or revoked.  Performed at [facility], [address] [place], [place] [address]   MRSA Next Gen by PCR, Nasal     Status: None   Collection Time: 10/27/23  9:27 PM   Specimen: Nasal Mucosa; Nasal Swab  Result Value Ref Range Status   MRSA by PCR Next Gen NOT DETECTED NOT DETECTED Final    Comment: (NOTE) The GeneXpert MRSA Assay (FDA approved for NASAL specimens only), is one component of a comprehensive MRSA colonization surveillance program. It is not intended to diagnose MRSA infection nor to guide or monitor treatment for MRSA infections. Test performance is not FDA approved in patients less than 80 years old. Performed at [facility], [address], [place], [place] [address]     Anti-infectives:  Anti-infectives (From admission, onward)    None     Consults: Treatment Team:  Tressie Stalker, MD    Studies:    Events:  Subjective:  Overnight Issues: some delirium with dilaudid  Objective:  Vital signs for last 24 hours: Temp:  [97 F (36.1 C)-98.2 F (36.8 C)] 98 F (36.7 C) (11/10 0800) Pulse Rate:  [70-97] 82 (11/10 0900) Resp:   [11-30] 16 (11/10 0900) BP: (100-182)/(60-122) 152/64 (11/10 0900) SpO2:  [84 %-100 %] 99 % (11/10 0900)  Hemodynamic parameters for last 24 hours:    Intake/Output from previous day: 11/09 0701 - 11/10 0700 In: 60 [P.O.:60] Out: 850 [Urine:850]  Intake/Output this shift: Total I/O In: -  Out: 60 [Urine:60]  Vent settings for last 24 hours:    Physical Exam:  General: no distress Neuro: sleepy but wakes up and F/C HEENT/Neck: no JVD Resp: clear to auscultation bilaterally CVS: RRR GI: soft, NT Extremities: edema 1+  Results for orders placed or performed during the hospital encounter of 10/27/23 (from the past 24 hour(s))  CBC     Status: Abnormal   Collection Time: 10/30/23  5:19 AM  Result Value Ref Range   WBC 9.2 4.0 - 10.5 K/uL   RBC 3.64 (L) 3.87 - 5.11 MIL/uL   Hemoglobin 12.3 12.0 - 15.0 g/dL   HCT 32.3 55.7 - 32.2 %   MCV 108.8 (H) 80.0 - 100.0 fL   MCH 33.8 26.0 - 34.0 pg   MCHC 31.1 30.0 - 36.0 g/dL   RDW 02.5 42.7 - 06.2 %   Platelets 225 150 - 400 K/uL   nRBC 0.0 0.0 - 0.2 %  Basic metabolic panel     Status: Abnormal   Collection Time: 10/30/23  5:19 AM  Result Value Ref Range   Sodium 140 135 - 145 mmol/L   Potassium 3.6 3.5 - 5.1 mmol/L   Chloride 94 (L) 98 - 111 mmol/L   CO2 36 (H) 22 - 32 mmol/L   Glucose, Bld 129 (H) 70 - 99 mg/dL   BUN 10 8 - 23 mg/dL   Creatinine, Ser 3.76 0.44 - 1.00 mg/dL   Calcium 8.8 (L) 8.9 - 10.3 mg/dL   GFR, Estimated >28 >31 mL/min   Anion gap 10 5 - 15    Assessment & Plan: Present on Admission:  Closed unstable burst fracture of T5 vertebra (HCC)    LOS: 3 days   Additional comments:I reviewed the patient's new clinical lab test results. / 11F s/p fall   SAH, IVH - NSGY c/s, Dr. Jake Samples, repeat head CT reviewed, keppra x7d, SLP c/s AF on eliquis - rec'd Andexxa, hold for now Subacute T5 burst fx - MRI completed, Being managed pre-admission by Dr. Lovell Sheehan. Brace at bedside. Neurosurgery following and  has on bedrest for now. R 6th rib - pain control, pulm toilet  FEN- reg diet but not eating. Start IVF. VTE- SCDs ID- no issues Dispo- ICU, D/C dilaudid due to delirium, therapies once can get UOB per NS I spoke with her family Critical Care Total Time*: 63 Minutes  Isabel Gelinas, MD, MPH, FACS Trauma & General Surgery Use [email or web address] to contact on call provider  10/30/2023  *Care during the described time interval was provided by me. I have reviewed this patient's available data, including medical history, events of note, physical examination and test results as part of my evaluation.

## 2023-10-30 NOTE — Progress Notes (Signed)
MD alerted that patient's UOP was low during previous day shift and night shift.  MD placed orders for IV fluid.

## 2023-10-30 NOTE — Progress Notes (Signed)
Alerted MD that patient UOP was still low throughout this shift.  Orders will given for RN to administer Albumin 5% to patient.

## 2023-10-31 DIAGNOSIS — Z7189 Other specified counseling: Secondary | ICD-10-CM

## 2023-10-31 DIAGNOSIS — Z515 Encounter for palliative care: Secondary | ICD-10-CM

## 2023-10-31 DIAGNOSIS — F411 Generalized anxiety disorder: Secondary | ICD-10-CM

## 2023-10-31 DIAGNOSIS — F1020 Alcohol dependence, uncomplicated: Secondary | ICD-10-CM | POA: Diagnosis not present

## 2023-10-31 LAB — FOLATE: Folate: 17.5 ng/mL (ref >5.9)

## 2023-10-31 LAB — VITAMIN B12: Vitamin B-12: 916 pg/mL — ABNORMAL HIGH (ref 180–914)

## 2023-10-31 MED ORDER — ENOXAPARIN SODIUM 40 MG/0.4ML IJ SOSY
40.0000 mg | PREFILLED_SYRINGE | Freq: Two times a day (BID) | INTRAMUSCULAR | Status: DC
  Administered 2023-10-31 – 2023-11-10 (×21): 40 mg via SUBCUTANEOUS
  Filled 2023-10-31 (×21): qty 0.4

## 2023-10-31 MED ORDER — KETOROLAC TROMETHAMINE 15 MG/ML IJ SOLN
15.0000 mg | Freq: Four times a day (QID) | INTRAMUSCULAR | Status: AC
  Administered 2023-10-31 – 2023-11-05 (×19): 15 mg via INTRAVENOUS
  Filled 2023-10-31 (×19): qty 1

## 2023-10-31 MED ORDER — BUSPIRONE HCL 15 MG PO TABS
15.0000 mg | ORAL_TABLET | Freq: Three times a day (TID) | ORAL | Status: DC
  Administered 2023-10-31 (×2): 15 mg via ORAL
  Filled 2023-10-31 (×3): qty 1

## 2023-10-31 MED ORDER — THIAMINE MONONITRATE 100 MG PO TABS: 100.0000 mg | ORAL_TABLET | Freq: Every day | ORAL | Status: DC

## 2023-10-31 MED ORDER — THIAMINE HCL 100 MG/ML IJ SOLN
500.0000 mg | Freq: Three times a day (TID) | INTRAVENOUS | Status: AC
  Administered 2023-10-31 – 2023-11-03 (×8): 500 mg via INTRAVENOUS
  Filled 2023-10-31 (×12): qty 5

## 2023-10-31 MED ORDER — OXYCODONE HCL 5 MG PO TABS
5.0000 mg | ORAL_TABLET | ORAL | Status: DC | PRN
  Administered 2023-10-31: 5 mg via ORAL
  Administered 2023-10-31: 10 mg via ORAL
  Administered 2023-11-01 (×2): 5 mg via ORAL
  Filled 2023-10-31: qty 2
  Filled 2023-10-31 (×4): qty 1

## 2023-10-31 MED ORDER — FUROSEMIDE 10 MG/ML IJ SOLN
40.0000 mg | Freq: Once | INTRAMUSCULAR | Status: AC
  Administered 2023-10-31: 40 mg via INTRAVENOUS
  Filled 2023-10-31: qty 4

## 2023-10-31 MED ORDER — ADULT MULTIVITAMIN W/MINERALS CH
1.0000 | ORAL_TABLET | Freq: Every day | ORAL | Status: DC
  Administered 2023-10-31: 1 via ORAL
  Filled 2023-10-31: qty 1

## 2023-10-31 MED ORDER — LORAZEPAM 2 MG/ML IJ SOLN
1.0000 mg | INTRAMUSCULAR | Status: DC | PRN
  Administered 2023-10-31: 1 mg via INTRAVENOUS
  Administered 2023-10-31 – 2023-11-01 (×2): 2 mg via INTRAVENOUS
  Administered 2023-11-01: 4 mg via INTRAVENOUS
  Administered 2023-11-01 (×4): 2 mg via INTRAVENOUS
  Administered 2023-11-01: 1 mg via INTRAVENOUS
  Administered 2023-11-02: 3 mg via INTRAVENOUS
  Administered 2023-11-02: 2 mg via INTRAVENOUS
  Administered 2023-11-02: 1 mg via INTRAVENOUS
  Filled 2023-10-31 (×6): qty 1
  Filled 2023-10-31: qty 2
  Filled 2023-10-31 (×3): qty 1
  Filled 2023-10-31: qty 2
  Filled 2023-10-31: qty 1

## 2023-10-31 MED ORDER — FOLIC ACID 1 MG PO TABS
1.0000 mg | ORAL_TABLET | Freq: Every day | ORAL | Status: DC
  Administered 2023-10-31: 1 mg via ORAL
  Filled 2023-10-31: qty 1

## 2023-10-31 MED ORDER — LORAZEPAM 1 MG PO TABS
1.0000 mg | ORAL_TABLET | ORAL | Status: DC | PRN
  Administered 2023-11-01: 1 mg via ORAL
  Filled 2023-10-31: qty 1

## 2023-10-31 MED ORDER — QUETIAPINE FUMARATE 25 MG PO TABS
25.0000 mg | ORAL_TABLET | Freq: Two times a day (BID) | ORAL | Status: DC
  Administered 2023-10-31 (×2): 25 mg via ORAL
  Filled 2023-10-31 (×2): qty 1

## 2023-10-31 MED ORDER — SENNA 8.6 MG PO TABS
2.0000 | ORAL_TABLET | Freq: Two times a day (BID) | ORAL | Status: DC
  Administered 2023-10-31 (×2): 17.2 mg via ORAL
  Filled 2023-10-31 (×2): qty 2

## 2023-10-31 MED ORDER — POLYETHYLENE GLYCOL 3350 17 G PO PACK
17.0000 g | PACK | Freq: Every day | ORAL | Status: DC
  Administered 2023-10-31: 17 g via ORAL
  Filled 2023-10-31: qty 1

## 2023-10-31 MED ORDER — HALOPERIDOL LACTATE 5 MG/ML IJ SOLN
10.0000 mg | Freq: Four times a day (QID) | INTRAMUSCULAR | Status: DC | PRN
  Administered 2023-11-02 – 2023-11-10 (×7): 10 mg via INTRAVENOUS
  Filled 2023-10-31 (×7): qty 2

## 2023-10-31 MED ORDER — METHOCARBAMOL 500 MG PO TABS
1000.0000 mg | ORAL_TABLET | Freq: Three times a day (TID) | ORAL | Status: DC
  Administered 2023-10-31 – 2023-11-01 (×3): 1000 mg via ORAL
  Filled 2023-10-31 (×3): qty 2

## 2023-10-31 NOTE — Progress Notes (Signed)
No second PIV at this time s/p 2x PIV attempts, IV team consulted for placement of PIV. IV Thiamine not given at this time. This will be communicated w/ on-coming RN, providing med is not given prior to.

## 2023-10-31 NOTE — Telephone Encounter (Signed)
2nd attempt to reach pt regarding surgical clearance and the need for a tele visit.  Vmail full / couldn't leave a message.

## 2023-10-31 NOTE — Progress Notes (Signed)
Subjective: The patient is sedated.  By report she has been drinking alcohol heavily and is withdrawing.  Objective: Vital signs in last 24 hours: Temp:  [96.5 F (35.8 C)-98.4 F (36.9 C)] 96.5 F (35.8 C) (11/11 0400) Pulse Rate:  [69-95] 69 (11/11 0700) Resp:  [16-25] 21 (11/11 0700) BP: (110-183)/(53-125) 132/67 (11/11 0700) SpO2:  [81 %-100 %] 97 % (11/11 0700) FiO2 (%):  [40 %] 40 % (11/11 0338) Weight:  [107.7 kg] 107.7 kg (11/11 0600) Estimated body mass index is 37.19 kg/m as calculated from the following:   Height as of this encounter: 5\' 7"  (1.702 m).   Weight as of this encounter: 107.7 kg.   Intake/Output from previous day: 11/10 0701 - 11/11 0700 In: 2207 [I.V.:2207] Out: 2430 [Urine:2430] Intake/Output this shift: No intake/output data recorded.  Physical exam the patient is arousable when she moves her lower extremities.  Lab Results: Recent Labs    10/30/23 0519 10/30/23 1654  WBC 9.2  --   HGB 12.3 12.6  HCT 39.6 37.0  PLT 225  --    BMET Recent Labs    10/29/23 0536 10/30/23 0519 10/30/23 1654  NA 136 140 139  K 3.4* 3.6 3.6  CL 92* 94*  --   CO2 36* 36*  --   GLUCOSE 150* 129*  --   BUN 11 10  --   CREATININE 0.42* 0.44  --   CALCIUM 8.5* 8.8*  --     Studies/Results: DG CHEST PORT 1 VIEW  Result Date: 10/30/2023 CLINICAL DATA:  141880 SOB (shortness of breath) [id number] EXAM: PORTABLE CHEST 1 VIEW COMPARISON:  Chest x-ray 10/27/2023 FINDINGS: The heart and mediastinal contours are unchanged. Atherosclerotic plaque. Interval development of diffuse interstitial airspace opacities, right greater than left, with as well development of patchy right mid to lower lung zone airspace opacities. No pleural effusion. No pneumothorax. No acute osseous abnormality. Reversed total left shoulder arthroplasty. IMPRESSION: Interval development of diffuse interstitial airspace opacities, right greater than left, with as well development of patchy right mid  to lower lung zone airspace opacities. Followup PA and lateral chest X-ray is recommended in 3-4 weeks following therapy to ensure resolution and exclude underlying malignancy. Electronically Signed   By: Tish Frederickson M.D.   On: 10/30/2023 17:55    Assessment/Plan: T6 fracture, stenosis: Her exam is somewhat limited by her sedation and withdrawal but she is moving her lower extremities.  The head of bed can be raised to 30 degrees to help her with her respiratory status.  She can be mobilized with PT when her mental status clears.  She needs to don her TLSO while supine in bed.  The surgical alternatives are risky and with significant risk of failure.  If she were to develop weakness in her legs we might need to reconsider surgical options.  LOS: 4 days     Cristi Loron 10/31/2023, 7:51 AM

## 2023-10-31 NOTE — Consult Note (Signed)
[name] Psychiatry Consult Evaluation  Service Date: October 31, 2023 LOS:  LOS: 4 days    Primary Psychiatric Diagnoses  EtOH w/d delirium  2.  GAD, severe 3.  High risk for Wernicke/Korsakoff   Assessment  [name] is a 72 y.o. female admitted medically for 10/27/2023  3:31 PM after a fall. She carries the psychiatric diagnoses of anxiety, depression, and EtOH use d/o and has a past medical history of  afib, breast cancer, chronic pain, hypothyroidism, spinal stenosis, vit D deficiency. Psychiatry was consulted for anxiety, coping  by Dr. Bedelia [name].  Am familiar with this pt from a prior hospitalization - at that point pt went into severe EtOH w/d, began confabulating, and ultimately required IV thiamine. Discussed this with Dr. Bedelia [name] shortly after consult placed.   At this time, the patient's presentation is most consistent with  delirium, most likely due to multiple etiologies including but not limited to alcohol withdrawal infection, medications, pain, altered sleep/wake cycle, and limited mobility. The patient would strongly benefit from medical treatment of alcohol withdrawal, as well as further investigation for etiologies of delirium. Full psychiatric assessment was not able to be completed as pt was not able to engage in a coherent interview - by history (and multiple family member's accounts) anxiety has driven her drinking behavior. Debated phenobarb vs ativan for EtOH w/d (having sx despite being on alpha agonist which can help w/ milder cases) - ultimately went with ativan d/t older age, medical frailty, and most importantly shorter half life (easier to stop/hold if it worsens delirium).  Plan d/w daughter and husband. Pt unable to engage in discussion of r/b/se.   During this time period, minimization of delirogenic insults will be of utmost importance; this includes promoting the normal circadian cycle, minimizing lines/tubes, avoiding deliriogenic medications such as  benzodiazepines and anticholinergic medications, and frequently reorienting the patient. Symptomatic treatment for agitation can be provided by antipsychotic medications, though it is important to remember that these do not treat the underlying etiology of delirium. Notably, there can be a time lag effect between treatment of a medical problem and resolution of delirium. This time lag effect may be of longer duration in the elderly, and those with underlying cognitive impairment or brain injury.   Diagnoses:  Active Hospital problems: Principal Problem:   Closed unstable burst fracture of T5 vertebra (HCC)     Plan   ## Psychiatric Medication Recommendations:  -- STOP sertraline 50 mg -- CONTINUE cymbalta 60 BID, buspirone 15 TID, quetiapine 25 at bedtime, trazodone 100 at bedtime  - home regimen  -- START high dose IV thiamine protocol (500 TID x 9 doses, then oral) -- START CIWA + sx triggered ativan - likely start taper when we get a good idea of daily requirements    Good gabapentin candidate  On belbuca 450 at home, getting full agonist opioids currently.,   ## Medical Decision Making Capacity:  Clearly lacked for most major decisions at time I saw; unable to engage in discussion of follow simple tasks. Likely retains DMC at baseline, needs to be carefully assessed for each major dedcision  ## Further Work-up:  -- B1 (added on to prev collection), folate, B12   -- most recent EKG on 11/07 had QtC of 479 -- Pertinent labwork reviewed earlier this admission includes: MCV 108  ## Disposition:  -- There are no psychiatric contraindications to discharge at this time  ## Behavioral / Environmental:  --  Delirium Precautions: Delirium Interventions  for Nursing and Staff: - RN to open blinds every AM. - To Bedside: Glasses, hearing aide, and pt's own shoes. Make available to patients. when possible and encourage use. - Encourage po fluids when appropriate, keep fluids within  reach. - OOB to chair with meals. - Passive ROM exercises to all extremities with AM & PM care. - RN to assess orientation to [name], time and place QAM and PRN. - Recommend extended visitation hours with familiar family/friends as feasible. - Staff to minimize disturbances at night. Turn off television when pt asleep or when not in use.     ## Safety and Observation Level:  - Based on my clinical evaluation, I estimate the patient to be at low risk of self harm in the current setting mostly due to decreased safety awareness   - At this time, we recommend a routine level of observation. This decision is based on my review of the chart including patient's history and current presentation, interview of the patient, mental status examination, and consideration of suicide risk including evaluating suicidal ideation, plan, intent, suicidal or self-harm behaviors, risk factors, and protective factors. This judgment is based on our ability to directly address suicide risk, implement suicide prevention strategies and develop a safety plan while the patient is in the clinical setting. Please contact our team if there is a concern that risk level has changed.  Suicide risk assessment  Patient has following modifiable risk factors for suicide: severe anxiety, EtOH use d/o which we are addressing by addressing meds as appropriate, providing outpt resources closer to dc.   Patient has following non-modifiable or demographic risk factors for suicide: N/A  Patient has the following protective factors against suicide: Supportive family and no history of suicide attempts   Thank you for this consult request. Recommendations have been communicated to the primary team.  We will continue to follow at this time.   [name]  Psychiatric and Social History   Relevant Aspects of Hospital Course:  Admitted on 10/27/2023 for a T5 fracture following a fall. They have been progressively more  anxious/confused since. Notably, at time of this note was operating under assumption pt did not want daughter informed of EtOH use.   Patient Report:  Pt seen in AM. She has difficulty answering most orientation questions - knows it is Nov but not the year, etc. Some familiarity w/ recent news. Difficulties w/ attn - tried several times to get her to do a task (count from 15 to 8) but got distracted by pain, BP cuff, etc. Fixated on idea that she is paralyzed and only redirectable for a minute at a time, poor reality testing on this issue. Despite this had significant issues w/ word finding had to be reminded of word "paralyzed" and often only got first half of word out.  Made a couple of conditional SI statements "I want to die if I'm paralyzed", "I want to die because of the blood pressure cuff" - unclear if she understood what she was saying, in any case denied any thoughts of doing something (passive) and has had no dangerous behavior while here. Did not endorse any HI, AH/VH while I was there but again unclear how much she really understands  Spoke to daughter - pt has been confused, anxous, difficult to redirect. Discussed pt's transient SI statement - will notify staff. Doid not discuss EtOH use (at the time, was under impression daughter was not to be informed about EtOH use)  Called husband -  now OK to loop daughter into EtOH use. Talked through pt's long history of anxiety (through life) and EtOH use (which emerged later in life). Started drinking more when daughter went to college. Anxiety got worse w/ health issues, started self medicating with wine. Thinks most recent falls d/t EtOH but unsure. Never went to EtOH rehab, did benefit from AA - was sober for a year up until around Jan of this  year. Had large increase in use over past 4 weeks - husband not sure how much but many wine bottles found in room. He is guessing about 1 bottle of wine a day on top of pain pills, muscle relaxers. Was eating  very little.    Psych ROS:  Pt unable to participate in meaningful ROS d/t AMS.   Collateral information:  Daughter at bedside, husband above.   Psychiatric History:  Information collected from pt, husband, EMR  Prev Dx/Sx: GAD>>> depression  Current Psych Provider: Sharon Seller NP Home Meds (current):  Buspirone 15 BID, cymbalta 120 daily, trazodone 100 mg, sertraline 50 mg  Previous Med Trials: unclear Therapy: not really   Prior ECT: unlikely Prior Psych Hospitalization: no  Prior Self Harm: no Prior Violence: no  Family Psych History: unclear Family Hx suicide: unclear  Social History:  Educational Hx: Husband describes as Counselling psychologist, Gaffer, etc Occupational Hx: unknown Living Situation: With husband Access to weapons: Deferred  Substance History Tobacco use: no Alcohol use: see HPI Drug use: no   Exam Findings   Psychiatric Specialty Exam:  Presentation  General Appearance: Disheveled  Eye Contact:Poor  Speech:Garbled (often unrelated to topic at hand)  Speech Volume:Decreased  Handedness:Right   Mood and Affect  Mood:-- ("I'm terrified I'm paralyzed")  Affect:Labile; Tearful (anxious)   Thought Process  Thought Processes:Disorganized; Irrevelant  Descriptions of Associations:Loose  Orientation:Partial  Thought Content:Illogical; Perseveration (anxiety bordering on delusions)  Hallucinations:Hallucinations: -- (Didn't really respond to ?, not overtly RIS at time of my exam)  Ideas of Reference:None  Suicidal Thoughts:Suicidal Thoughts: -- (briefly endorsed SI with no plan)  Homicidal Thoughts:Homicidal Thoughts: No   Sensorium  Memory:Immediate Poor; Recent Poor; Remote Poor  Judgment:Poor  Insight:Poor   Executive Functions  Concentration:Poor  Attention Span:Poor  Recall:Fair  Fund of Knowledge:Poor  Language:Poor   Psychomotor Activity  Psychomotor Activity:Psychomotor Activity: Normal   Assets   Assets:Social Support   Sleep  Sleep:Sleep: -- (Unclear)    Physical Exam: Vital signs:  Temp:  [96.5 F (35.8 C)-99.7 F (37.6 C)] 99.6 F (37.6 C) (11/11 1200) Pulse Rate:  [63-95] 76 (11/11 1500) Resp:  [16-25] 21 (11/11 1500) BP: (110-178)/(53-125) 155/82 (11/11 1500) SpO2:  [88 %-100 %] 95 % (11/11 1500) FiO2 (%):  [40 %] 40 % (11/11 0754) Weight:  [107.7 kg] 107.7 kg (11/11 0600) Physical Exam HENT:     Head: Normocephalic.  Pulmonary:     Comments: Inc effort when anxious  Musculoskeletal:     Comments: Able to move both l/e   Neurological:     Mental Status: She is alert. She is disoriented.     Blood pressure (!) 155/82, pulse 76, temperature 99.6 F (37.6 C), temperature source Axillary, resp. rate (!) 21, height 5\' 7"  (1.702 m), weight 107.7 kg, SpO2 95%. Body mass index is 37.19 kg/m.   Other History   These have been pulled in through the EMR, reviewed, and updated if appropriate.   Family History:  The patient's family history includes Alcoholism in her father; Anxiety disorder in her  mother; Dementia in her mother; Depression in her mother; Diabetes in her father and sister; Hypertension in her sister; Stroke in her father; Thyroid disease in her mother.  Medical History: Past Medical History:  Diagnosis Date   Anxiety    Atrial fibrillation [facility])    sees Dr. Flora Lipps   Bilateral swelling of feet    Cancer [facility])    breast   Chronic diarrhea    Chronic pain    Concussion 02/2007   ICU x 3 days   Depression    Hypertension    Hypothyroidism    Personal history of chemotherapy    Personal history of radiation therapy    Spinal stenosis    Thyroid disease ?1994   Vitamin D deficiency     Surgical History: Past Surgical History:  Procedure Laterality Date   BREAST BIOPSY Right 12/20/2019   x2   BREAST LUMPECTOMY Right 01/22/2020   BREAST LUMPECTOMY WITH RADIOACTIVE SEED AND SENTINEL LYMPH NODE BIOPSY Right 01/22/2020   Procedure:  RIGHT BREAST LUMPECTOMY WITH RADIOACTIVE SEED X2 AND RIGHT SENTINEL LYMPH NODE MAPPING;  Surgeon: Harriette Bouillon, MD;  Location: [facility];  Service: General;  Laterality: Right;   BREAST SURGERY Right 03/1998   breast biopsy, benign   EYE SURGERY Right    PORT-A-CATH REMOVAL Right 04/30/2021   Procedure: REMOVAL PORT-A-CATH;  Surgeon: Harriette Bouillon, MD;  Location: [facility];  Service: General;  Laterality: Right;   PORTACATH PLACEMENT Right 01/22/2020   Procedure: INSERTION PORT-A-CATH WITH ULTRASOUND;  Surgeon: Harriette Bouillon, MD;  Location: [facility];  Service: General;  Laterality: Right;   PORTACATH PLACEMENT Right 02/28/2020   Procedure: PORT A CATH REVISION;  Surgeon: Harriette Bouillon, MD;  Location: [facility] OR;  Service: General;  Laterality: Right;   REVERSE SHOULDER ARTHROPLASTY Left 02/02/2021   Procedure: LEFT REVERSE SHOULDER ARTHROPLASTY;  Surgeon: Cammy Copa, MD;  Location: [facility] OR;  Service: Orthopedics;  Laterality: Left;   SHOULDER SURGERY Right    SPINAL FUSION  03/04/2011   with ORIF    Medications:   Current Facility-Administered Medications:    acetaminophen (TYLENOL) tablet 1,000 mg, 1,000 mg, Oral, Q6H, Kinsinger, De Blanch, MD, 1,000 mg at 10/31/23 1142   busPIRone (BUSPAR) tablet 15 mg, 15 mg, Oral, TID, [name], MD, 15 mg at 10/31/23 1603   carvedilol (COREG) tablet 25 mg, 25 mg, Oral, BID WC, [name], MD, 25 mg at 10/31/23 1603   Chlorhexidine Gluconate Cloth 2 % PADS 6 each, 6 each, Topical, Daily, Kinsinger, De Blanch, MD, 6 each at 10/31/23 1549   dexmedetomidine (PRECEDEX) 400 MCG/100ML (4 mcg/mL) infusion, 0-1.2 mcg/kg/hr, Intravenous, Titrated, Moise Boring, MD, Last Rate: 9.07 mL/hr at 10/31/23 1600, 0.4 mcg/kg/hr at 10/31/23 1600   docusate sodium (COLACE) capsule 100 mg, 100 mg, Oral, BID, Kinsinger, De Blanch, MD, 100 mg at 10/31/23 1047   DULoxetine (CYMBALTA) DR capsule 60  mg, 60 mg, Oral, BID, [name], MD, 60 mg at 10/31/23 1034   enoxaparin (LOVENOX) injection 40 mg, 40 mg, Subcutaneous, Q12H, Lovick, Lennie Odor, MD, 40 mg at 10/31/23 1035   folic acid (FOLVITE) tablet 1 mg, 1 mg, Oral, Daily, Armour Villanueva A, 1 mg at 10/31/23 1142   haloperidol lactate (HALDOL) injection 10 mg, 10 mg, Intravenous, Q6H PRN, [name], MD   hydrALAZINE (APRESOLINE) injection 10 mg, 10 mg, Intravenous, Q4H PRN, [name], MD, 10 mg at 10/30/23 1506   ketorolac (TORADOL) 15  MG/ML injection 15 mg, 15 mg, Intravenous, Q6H, Lovick, Lennie Odor, MD, 15 mg at 10/31/23 1142   levETIRAcetam (KEPPRA) tablet 500 mg, 500 mg, Oral, BID, [name], MD, 500 mg at 10/31/23 1034   levothyroxine (SYNTHROID) tablet 150 mcg, 150 mcg, Oral, Q0600, [name], MD, 150 mcg at 10/31/23 0844   LORazepam (ATIVAN) tablet 1-4 mg, 1-4 mg, Oral, Q1H PRN **OR** LORazepam (ATIVAN) injection 1-4 mg, 1-4 mg, Intravenous, Q1H PRN, Lexa Coronado A, 2 mg at 10/31/23 1606   losartan (COZAAR) tablet 100 mg, 100 mg, Oral, Daily, [name], MD, 100 mg at 10/31/23 1034   methocarbamol (ROBAXIN) tablet 1,000 mg, 1,000 mg, Oral, Q8H, Lovick, Lennie Odor, MD, 1,000 mg at 10/31/23 1448   metoprolol tartrate (LOPRESSOR) injection 5 mg, 5 mg, Intravenous, Q6H PRN, Kinsinger, De Blanch, MD, 5 mg at 10/30/23 1406   morphine (PF) 4 MG/ML injection 4 mg, 4 mg, Intravenous, Q2H PRN, [name], MD, 4 mg at 10/31/23 0849   multivitamin with minerals tablet 1 tablet, 1 tablet, Oral, Daily, Jayce Boyko A, 1 tablet at 10/31/23 1142   ondansetron (ZOFRAN-ODT) disintegrating tablet 4 mg, 4 mg, Oral, Q6H PRN **OR** ondansetron (ZOFRAN) injection 4 mg, 4 mg, Intravenous, Q6H PRN, Kinsinger, De Blanch, MD, 4 mg at 10/29/23 1205   Oral care mouth rinse, 15 mL, Mouth Rinse, PRN, Kinsinger, De Blanch, MD   oxyCODONE (Oxy IR/ROXICODONE) immediate release tablet 5-10 mg, 5-10 mg, Oral,  Q4H PRN, [name], MD, 5 mg at 10/31/23 1604   polyethylene glycol (MIRALAX / GLYCOLAX) packet 17 g, 17 g, Oral, Daily PRN, Kinsinger, De Blanch, MD   polyethylene glycol (MIRALAX / GLYCOLAX) packet 17 g, 17 g, Oral, Daily, Lovick, Lennie Odor, MD, 17 g at 10/31/23 1035   QUEtiapine (SEROQUEL) tablet 25 mg, 25 mg, Oral, BID, [name], MD, 25 mg at 10/31/23 1034   senna (SENOKOT) tablet 17.2 mg, 2 tablet, Oral, BID, [name], MD, 17.2 mg at 10/31/23 1034   sodium chloride (OCEAN) 0.65 % nasal spray 1 spray, 1 spray, Each Nare, PRN, [name], MD, 1 spray at 10/30/23 1156   thiamine (VITAMIN B1) 500 mg in sodium chloride 0.9 % 50 mL IVPB, 500 mg, Intravenous, TID, Stopped at 10/31/23 1246 **FOLLOWED BY** [START ON 11/03/2023] thiamine (VITAMIN B1) tablet 100 mg, 100 mg, Oral, Daily, Joyann Spidle A   traZODone (DESYREL) tablet 100 mg, 100 mg, Oral, QHS, Moise Boring, MD, 100 mg at 10/30/23 2359  Allergies: Allergies  Allergen Reactions   Zolpidem Tartrate Other (See Comments)    Hallucinations and Confusion

## 2023-10-31 NOTE — Evaluation (Signed)
Physical Therapy Evaluation Patient Details Name: [name] MRN: [id number] DOB: April 02, 1951 Today's Date: 10/31/2023  History of Present Illness  72 year old female with chronic A-fib on Eliquis, who lost her balance and fell backward and hit her head.  Workup revealed left traumatic subarachnoid hemorrhage/contusion without significant mass effect.  There is also a T6 burst fracture with retropulsion and significant loss of height.  Clinical Impression   Pt presents with generalized weakness, impaired cognition with AMS, max difficulty performing bed-level mobility Pt to benefit from acute PT to address deficits. Pt tolerating rolling in bed only this date, requiring max-total +2 assist to perform and unable to place brace for progression to EOB or OOB given AMS. Patient will benefit from continued inpatient follow up therapy, <3 hours/day. PT to progress mobility as tolerated, and will continue to follow acutely.          If plan is discharge home, recommend the following: Two people to help with walking and/or transfers;Two people to help with bathing/dressing/bathroom   Can travel by private vehicle        Equipment Recommendations None recommended by PT  Recommendations for Other Services       Functional Status Assessment Patient has had a recent decline in their functional status and demonstrates the ability to make significant improvements in function in a reasonable and predictable amount of time.     Precautions / Restrictions Precautions Precautions: Back;Fall;Other (comment) Precaution Booklet Issued: No Precaution Comments: TLSO donned in supine, on in bed if HOB >30*. Bilateral mitts Required Braces or Orthoses: Spinal Brace Spinal Brace: Thoracolumbosacral orthotic Restrictions Weight Bearing Restrictions: No      Mobility  Bed Mobility Overal bed mobility: Needs Assistance Bed Mobility: Rolling Rolling: Total assist, +2 for physical assistance          General bed mobility comments: VC provided for technique for log roll with total assist required. Bed pad used to assist with rolling. Pt rolled right and left with increased pain verbalized and pt physically resisting by pushing away from OT when rollling to the her left.    Transfers                   General transfer comment: Deferred transfer due to safety reasons.    Ambulation/Gait                  Stairs            Wheelchair Mobility     Tilt Bed    Modified Rankin (Stroke Patients Only)       Balance Overall balance assessment: History of Falls, Needs assistance     Sitting balance - Comments: unable to assess this date                                     Pertinent Vitals/Pain Pain Assessment Pain Assessment: Faces Faces Pain Scale: Hurts worst Pain Location: back, esp with roll L Pain Descriptors / Indicators: Moaning, Guarding, Grimacing Pain Intervention(s): Limited activity within patient's tolerance, Monitored during session, Repositioned    Home Living Family/patient expects to be discharged to:: Private residence Living Arrangements: Spouse/significant other Available Help at Discharge: Family;Available 24 hours/day Type of Home: House Home Access: Stairs to enter Entrance Stairs-Rails: Left Entrance Stairs-Number of Steps: 4   Home Layout: One level Home Equipment: Rollator (4 wheels);BSC/3in1;Shower seat - built in;Grab bars - tub/shower  Prior Function Prior Level of Function : Independent/Modified Independent             Mobility Comments: uses a rollator 11 months ago when here ADLs Comments: independent but reports difficulty donning pants     Extremity/Trunk Assessment   Upper Extremity Assessment Upper Extremity Assessment: Defer to OT evaluation    Lower Extremity Assessment Lower Extremity Assessment: Generalized weakness;Difficult to assess due to impaired cognition     Cervical / Trunk Assessment Cervical / Trunk Assessment: Other exceptions Cervical / Trunk Exceptions: back precautions with recent thoracic fracture.  Communication   Communication Communication: Difficulty following commands/understanding;Difficulty communicating thoughts/reduced clarity of speech Following commands: Follows one step commands inconsistently Cueing Techniques: Verbal cues;Gestural cues  Cognition Arousal: Obtunded Behavior During Therapy: Restless, Flat affect, Impulsive Overall Cognitive Status: No family/caregiver present to determine baseline cognitive functioning Area of Impairment: Orientation, Attention, Memory, Following commands, Safety/judgement, Problem solving, Awareness                 Orientation Level: Disoriented to, Place, Time, Situation Current Attention Level: Focused Memory: Decreased short-term memory, Decreased recall of precautions Following Commands: Follows one step commands inconsistently Safety/Judgement: Decreased awareness of safety, Decreased awareness of deficits Awareness: Intellectual Problem Solving: Slow processing, Decreased initiation, Difficulty sequencing, Requires verbal cues, Requires tactile cues General Comments: Asked repeated questions during eval that did not pertain to topic of conversation. Pt asking multiple times during session "am I in a movie? is this the one where they stage a .Marland Kitchen.? demi moore" Denied reason for being in the hospital when told and asked repeatedly why happened.        General Comments General comments (skin integrity, edema, etc.): bruising noted especially on UEs. VSS    Exercises     Assessment/Plan    PT Assessment Patient needs continued PT services  PT Problem List Decreased strength;Decreased mobility;Decreased balance;Decreased activity tolerance;Decreased knowledge of use of DME;Pain;Decreased knowledge of precautions;Decreased safety awareness;Decreased cognition       PT  Treatment Interventions Therapeutic activities;DME instruction;Gait training;Therapeutic exercise;Patient/family education;Stair training;Balance training;Functional mobility training;Neuromuscular re-education    PT Goals (Current goals can be found in the Care Plan section)  Acute Rehab PT Goals Patient Stated Goal: none stated PT Goal Formulation: Patient unable to participate in goal setting Time For Goal Achievement: 11/14/23 Potential to Achieve Goals: Fair    Frequency Min 1X/week     Co-evaluation   Reason for Co-Treatment: Complexity of the patient's impairments (multi-system involvement);To address functional/ADL transfers;For patient/therapist safety   OT goals addressed during session: Strengthening/ROM;ADL's and self-care       AM-PAC PT "6 Clicks" Mobility  Outcome Measure Help needed turning from your back to your side while in a flat bed without using bedrails?: A Lot Help needed moving from lying on your back to sitting on the side of a flat bed without using bedrails?: Total Help needed moving to and from a bed to a chair (including a wheelchair)?: Total Help needed standing up from a chair using your arms (e.g., wheelchair or bedside chair)?: Total Help needed to walk in hospital room?: Total Help needed climbing 3-5 steps with a railing? : Total 6 Click Score: 7    End of Session Equipment Utilized During Treatment: Oxygen (back brace to be donned in supine, pt only tolerated log roll so not donned this date) Activity Tolerance: Patient limited by fatigue;Patient limited by pain;Patient limited by lethargy Patient left: in bed;with call bell/phone within reach;with bed alarm set;Other (comment) (  with bilat mitts) Nurse Communication: Mobility status PT Visit Diagnosis: Other abnormalities of gait and mobility (R26.89);Muscle weakness (generalized) (M62.81)    Time: 9381-8299 PT Time Calculation (min) (ACUTE ONLY): 22 min   Charges:   PT Evaluation $PT  Eval Low Complexity: 1 Low   PT General Charges $$ ACUTE PT VISIT: 1 Visit        Marye Round, PT DPT Acute Rehabilitation Services Secure Chat Preferred  Office [phone]    Kresta Templeman Sheliah Plane 10/31/2023, 4:27 PM

## 2023-10-31 NOTE — Progress Notes (Signed)
Pt noted to have non-sustained VT during IV placed (attempt x2). Weston Brass, RN reviewed cardiac strip and stated that strip looked as if it was not a true VT reading. No EKG gathered at this time.

## 2023-10-31 NOTE — Progress Notes (Signed)
Upon pt assessment, pt agitated and restless despite PRN medications. Paged Trauma MD. New orders given. Continued to monitor and assess pt. Pt continued to become more agitated and restless. TRN and Trauma MD paged. New orders given. Will continue to monitor and assess the pt and notify of any changes.

## 2023-10-31 NOTE — Progress Notes (Signed)
Trauma/Critical Care Follow Up Note  Subjective:    Overnight Issues:   Objective:  Vital signs for last 24 hours: Temp:  [96.5 F (35.8 C)-99.7 F (37.6 C)] 99.7 F (37.6 C) (11/11 0800) Pulse Rate:  [69-95] 95 (11/11 0845) Resp:  [16-25] 21 (11/11 0700) BP: (110-183)/(53-125) 168/82 (11/11 0845) SpO2:  [81 %-100 %] 88 % (11/11 0850) FiO2 (%):  [40 %] 40 % (11/11 0754) Weight:  [107.7 kg] 107.7 kg (11/11 0600)  Hemodynamic parameters for last 24 hours:    Intake/Output from previous day: 11/10 0701 - 11/11 0700 In: 2207 [I.V.:2207] Out: 2430 [Urine:2430]  Intake/Output this shift: Total I/O In: 120.4 [I.V.:120.4] Out: 50 [Urine:50]  Vent settings for last 24 hours: Vent Mode: PCV;BIPAP FiO2 (%):  [40 %] 40 % Set Rate:  [20 bmp] 20 bmp PEEP:  [5 cmH20] 5 cmH20 Pressure Support:  [17 cmH20] 17 cmH20  Physical Exam:  Gen: comfortable, no distress Neuro: follows commands, alert, communicative HEENT: PERRL Neck: supple CV: RRR Pulm: unlabored breathing on [place] Abd: soft, NT    GU: urine clear and yellow, +spontaneous voids Extr: wwp, no edema  Results for orders placed or performed during the hospital encounter of 10/27/23 (from the past 24 hour(s))  I-STAT 7, (LYTES, BLD GAS, ICA, H+H)     Status: Abnormal   Collection Time: 10/30/23  4:54 PM  Result Value Ref Range   pH, Arterial 7.299 (L) 7.35 - 7.45   pCO2 arterial 77.5 (HH) 32 - 48 mmHg   pO2, Arterial 332 (H) 83 - 108 mmHg   Bicarbonate 38.1 (H) 20.0 - 28.0 mmol/L   TCO2 40 (H) 22 - 32 mmol/L   O2 Saturation 100 %   Acid-Base Excess 9.0 (H) 0.0 - 2.0 mmol/L   Sodium 139 135 - 145 mmol/L   Potassium 3.6 3.5 - 5.1 mmol/L   Calcium, Ion 1.25 1.15 - 1.40 mmol/L   HCT 37.0 36.0 - 46.0 %   Hemoglobin 12.6 12.0 - 15.0 g/dL   Collection site RADIAL, ALLEN'S TEST ACCEPTABLE    Drawn by Operator    Sample type ARTERIAL    Comment NOTIFIED PHYSICIAN     Assessment & Plan: The plan of care was discussed  with the bedside nurse for the day, who is in agreement with this plan and no additional concerns were raised.   Present on Admission:  Closed unstable burst fracture of T5 vertebra (HCC)    LOS: 4 days   Additional comments:I reviewed the patient's new clinical lab test results.   and I reviewed the patients new imaging test results.     81F s/p fall   SAH, IVH - NSGY c/s, Dr. Jake Samples, repeat head CT reviewed, keppra x7d, SLP c/s AF on eliquis - rec'd Andexxa, hold for now Subacute T5 burst fx - MRI completed, Being managed pre-admission by Dr. Lovell Sheehan. Brace at bedside. Okay to ambulate, monitor for neuro sx after ambulation. Anxiety - precedex started over the weekend, start seroquel, increase buspar to TID, psych c/s R 6th rib - pain control, pulm toilet  FEN- reg diet  VTE- SCDs ID- no issues Dispo- ICU, palliative consulted for discussion of long-term GoC as I anticipate SNF recommendation from PT/OT. Daughter repeatedly states that "a facility will not go well". Non-operative management has the potential for long-term vs permanent facility living, chronic pain/opiates. Possible indication for surgery remains, which would be high risk for her due to anterior approach being required, body habitus, and  co-morbidities. Encouraged patient and family to discuss code status, but also things like trach/PEG/short-term feeding tube and whether she would be agreeable  to surgery if offered.   Critical Care Total Time: 80 minutes  [name], MD Trauma & General Surgery Please use [email or web address] to contact on call provider  10/31/2023  *Care during the described time interval was provided by me. I have reviewed this patient's available data, including medical history, events of note, physical examination and test results as part of my evaluation.

## 2023-10-31 NOTE — Evaluation (Signed)
Occupational Therapy Evaluation Patient Details Name: [name] MRN: [id number] DOB: [birth year] Today's Date: 10/31/2023   History of Present Illness 72 year old female with chronic A-fib on Eliquis, who lost her balance and fell backward and hit her head.  Workup revealed left traumatic subarachnoid hemorrhage/contusion without significant mass effect.  There is also a T6 burst fracture with retropulsion and significant loss of height.   Clinical Impression   Pt admitted with SAH and T6 burst fracture s/p mechanical fall at home. Pt currently with functional limitations due to the deficits listed below (see OT Problem List). Prior to admit, pt was living at home with husband and independent with all BADL tasks and functional mobility.  Pt will benefit from acute skilled OT to increase their safety and independence with ADL and functional mobility for ADL to facilitate discharge. Patient will benefit from continued inpatient follow up therapy, <3 hours/day. OT will continue to follow patient acutely.          If plan is discharge home, recommend the following: Two people to help with walking and/or transfers;Two people to help with bathing/dressing/bathroom;Supervision due to cognitive status;Assistance with feeding;Assist for transportation;Help with stairs or ramp for entrance;Direct supervision/assist for financial management;Direct supervision/assist for medications management    Functional Status Assessment  Patient has had a recent decline in their functional status and demonstrates the ability to make significant improvements in function in a reasonable and predictable amount of time.  Equipment Recommendations  Other (comment) (defer to next venue of care)       Precautions / Restrictions Precautions Precautions: Back;Fall;Other (comment) Precaution Booklet Issued: No Precaution Comments: TLSO donned in supine, on in bed if HOB >30*. Bilateral mitts Required Braces or  Orthoses: Spinal Brace Spinal Brace: Thoracolumbosacral orthotic Restrictions Weight Bearing Restrictions: No      Mobility Bed Mobility Overal bed mobility: Needs Assistance Bed Mobility: Rolling Rolling: Total assist, +2 for physical assistance         General bed mobility comments: VC provided for technique for log roll with total assist required. Bed pad used to assist with rolling. Pt rolled right and left with increased pain verbalized and pt physically resisting by pushing away from OT when rollling to the her left.    Transfers  General transfer comment: Deferred transfer due to safety reasons.      Balance Overall balance assessment: History of Falls           ADL either performed or assessed with clinical judgement   ADL Overall ADL's : Needs assistance/impaired        General ADL Comments: At this time, pt will require total A x2 for BADL tasks at bed level due to cognition, pain, and back precautions.     Vision Baseline Vision/History: 1 Wears glasses Ability to See in Adequate Light:  (unknown) Patient Visual Report:  (unable to asssess) Vision Assessment?: Wears glasses for reading (Unable to formally assess vision due to cognition) Additional Comments: Pt unable to follow commands to formally assess vision     Perception Perception: Not tested       Praxis Praxis: Not tested       Pertinent Vitals/Pain Pain Assessment Pain Assessment: Faces Faces Pain Scale: Hurts worst Pain Location: back when rolling in bed Pain Descriptors / Indicators: Moaning Pain Intervention(s): Repositioned, Monitored during session     Extremity/Trunk Assessment Upper Extremity Assessment Upper Extremity Assessment: Generalized weakness;Difficult to assess due to impaired cognition   Lower Extremity Assessment Lower Extremity Assessment:  Difficult to assess due to impaired cognition   Cervical / Trunk Assessment Cervical / Trunk Assessment: Other  exceptions Cervical / Trunk Exceptions: back precautions with recent thoracic fracture.   Communication Communication Communication: Difficulty following commands/understanding;Difficulty communicating thoughts/reduced clarity of speech Cueing Techniques: Verbal cues   Cognition Arousal: Obtunded Behavior During Therapy: Restless, Flat affect, Impulsive Overall Cognitive Status: No family/caregiver present to determine baseline cognitive functioning Area of Impairment: Orientation, Attention, Memory, Following commands, Safety/judgement, Problem solving, Awareness    Orientation Level: Disoriented to, Place, Time, Situation Current Attention Level: Focused Memory: Decreased short-term memory, Decreased recall of precautions Following Commands: Follows one step commands inconsistently Safety/Judgement: Decreased awareness of safety, Decreased awareness of deficits Awareness: Intellectual Problem Solving: Slow processing, Decreased initiation, Difficulty sequencing, Requires verbal cues, Requires tactile cues General Comments: Asked repeated questions during eval that did not pertain to topic of conversation. Denied reason for being in the hospital when told and asked repeatedly why happened.     General Comments  Several bruises noted all over (arms, legs, trunk). BP supine (30* HOB) at start of session: 138/73. 4L O2 [place] on.            Home Living Family/patient expects to be discharged to:: Private residence Living Arrangements: Spouse/significant other Available Help at Discharge: Family;Available 24 hours/day Type of Home: House Home Access: Stairs to enter [employer] of Steps: 4 Entrance Stairs-Rails: Left Home Layout: One level     Bathroom Shower/Tub: Producer, television/film/video: Standard     Home Equipment: Rollator (4 wheels);BSC/3in1;Shower seat - built in;Grab bars - tub/shower      Lives With: Spouse    Prior Functioning/Environment Prior  Level of Function : Independent/Modified Independent      Mobility Comments: uses a rollator 11 months ago when here ADLs Comments: independent but reports difficulty donning pants        OT Problem List: Decreased strength;Decreased activity tolerance;Impaired balance (sitting and/or standing);Impaired vision/perception;Decreased coordination;Decreased cognition;Decreased safety awareness;Decreased knowledge of use of DME or AE;Decreased knowledge of precautions;Impaired sensation;Impaired tone;Obesity;Impaired UE functional use;Pain      OT Treatment/Interventions: Self-care/ADL training;Therapeutic exercise;Neuromuscular education;Energy conservation;DME and/or AE instruction;Manual therapy;Modalities;Therapeutic activities;Cognitive remediation/compensation;Patient/family education;Visual/perceptual remediation/compensation;Balance training    OT Goals(Current goals can be found in the care plan section) Acute Rehab OT Goals Patient Stated Goal: None stated OT Goal Formulation: Patient unable to participate in goal setting Time For Goal Achievement: 11/14/23 Potential to Achieve Goals: Fair  OT Frequency: Min 1X/week    Co-evaluation PT/OT/SLP Co-Evaluation/Treatment: Yes Reason for Co-Treatment: Complexity of the patient's impairments (multi-system involvement);To address functional/ADL transfers;For patient/therapist safety   OT goals addressed during session: Strengthening/ROM;ADL's and self-care      AM-PAC OT "6 Clicks" Daily Activity     Outcome Measure Help from another person eating meals?: Total Help from another person taking care of personal grooming?: Total Help from another person toileting, which includes using toliet, bedpan, or urinal?: Total Help from another person bathing (including washing, rinsing, drying)?: Total Help from another person to put on and taking off regular upper body clothing?: Total Help from another person to put on and taking off regular  lower body clothing?: Total 6 Click Score: 6   End of Session Equipment Utilized During Treatment: Oxygen Nurse Communication: Mobility status  Activity Tolerance: Patient limited by pain;Patient limited by lethargy Patient left: in bed;with call bell/phone within reach;with bed alarm set;with SCD's reapplied  OT Visit Diagnosis: Unsteadiness on feet (R26.81);Muscle weakness (generalized) (M62.81);History of falling (  Z91.81);Other symptoms and signs involving cognitive function;Pain Pain - part of body:  (back)                Time: 1610-9604 OT Time Calculation (min): 25 min Charges:  OT General Charges $OT Visit: 1 Visit OT Evaluation $OT Eval High Complexity: 1 High  [employer], OTR/L,CBIS  Supplemental OT - [facility] Secure Chat Preferred    Amri Lien, Charisse March 10/31/2023, 3:13 PM

## 2023-10-31 NOTE — Progress Notes (Signed)
OT Cancellation Note  Patient Details Name: [name] MRN: [id number] DOB: 1951/12/14   Cancelled Treatment:    Reason Eval/Treat Not Completed: Medical issues which prohibited therapy Pt currently sedated and unable to participate in OT evaluation at this time. Per chart review, Neuro recommends working with therapy when mental status improves. TLSO from home needs to be donned supine in bed prior to mobility. Will continue to follow patient and complete evaluation when able to participate.   Limmie Patricia, OTR/L,CBIS  Supplemental OT - [facility] Secure Chat Preferred   10/31/2023, 8:49 AM

## 2023-10-31 NOTE — Consult Note (Signed)
Palliative Medicine Inpatient Consult Note Consulting Provider:  [name], MD   Reason for consult:   Palliative Care Consult Services Palliative Medicine Consult  Reason for Consult? GoC   10/31/2023  HPI:  [name] was walking and lost balance and fell backward. She complains of intense pain in her back. She takes eliquis for atrial fibrillation.   [name] has been asked to get involved to further support goals of care conversations.   Clinical Assessment/Goals of Care:  *Please note that this is a verbal dictation therefore any spelling or grammatical errors are due to the "Dragon Medical One" system interpretation.  I have reviewed medical records including EPIC notes, labs and imaging, received report from bedside RN, assessed the patient who is sedated on precedex.    I called patients daughter, [name] and spouse, [name] to further discuss diagnosis prognosis, GOC, EOL wishes, disposition and options.   I introduced Palliative Medicine as specialized medical care for people living with serious illness. It focuses on providing relief from the symptoms and stress of a serious illness. The goal is to improve quality of life for both the patient and the family.  Medical History Review and Understanding:  Discussed [name]'s past medical history of breast cancer, ETOH abuse for over 20 years relapse 60 days ago after knowledge of "fractured spine".  Has endured chronic pain from back - goes to [facility] pain clinic, anxiety, concussion 2008, HTN, depression, spinal fusion 2012.   Social History:  Has lived in [place] > 50 yrs and has been married to her spouse, [name] for > 47 years. Has one daughter and one step son. Has three  grand-kids. 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 Likes to participate in family functions, enjoys musicals, and is an avid reader. Is a woman of Christian faith.  Functional and Nutritional State:  Limited in  function. Quality of life with pain and mobility. Has been able to do many bADLs for herself though slowly. Remains to have a good appetite.  Advance Directives:  A detailed discussion was had today regarding advanced directives.  Patients spouse is her surrogate Management consultant.  Code Status:  Concepts specific to code status, artifical feeding and hydration, continued IV antibiotics and rehospitalization was had.  The difference between a aggressive medical intervention path  and a palliative comfort care path for this patient at this time was had.   Encouraged patient/family to consider DNR/DNI status understanding evidenced based poor outcomes in similar hospitalized patient, as the cause of arrest is likely associated with advanced chronic/terminal illness rather than an easily reversible acute cardio-pulmonary event. I explained that DNR/DNI does not change the medical plan and it only comes into effect after a person has arrested (died).  It is a protective measure to keep [place] from harming the patient in their last moments of life. Patients spouse does not believe that [place] would desire resuscitative efforts.   Discussion:  Per patients spouse and daughter, [name] has significant fear and anxiety which has not been well controlled. Both [name] and her father both feel this contributes to a good amount of her ongoing struggles. We discussed how she retired > 20 years ago and accumulated "bad habits" after that time in part thought to be due to boredom. She has had multiple falls and in the past. She has overall been functioning at a decreased capacity in the setting of prior fractures and injuries.  Per patients husband his  goals are for her to be optimized off of sedation and participate meaningfully with therapy. He is aware that she may have a new baseline level of function after this hospitalization.  Best case and worst case scenarios reviewed. We did discuss the idea of continued decline and  potential need for hospice should this be the reality.  [name] shares his goals at this time are geared towards recovery and improvement.   We discussed the importance of meeting in person - we plan to meet tomorrow at 10AM.  Discussed the importance of continued conversation with family and their  medical providers regarding overall plan of care and treatment options, ensuring decisions are within the context of the patients values and GOCs.  Decision Maker: [name] (spouse): [phone]  SUMMARY OF RECOMMENDATIONS   DNAR/DNI  Open and honest conversation held in the setting of patients acute on chronic health conditions  Plan to meet with family tomorrow at 10AM --> will introduce the MOST form for review and completion  Patient spouse is hopeful for improvement and skilled nursing to improve strength  Ongoing PMT support  Code Status/Advance Care Planning: DNAR/DNI  Palliative Prophylaxis:  Aspiration, Bowel Regimen, Delirium Protocol, Frequent Pain Assessment, Oral Care, Palliative Wound Care, and Turn Reposition  Additional Recommendations (Limitations, Scope, Preferences): Continue present care  Psycho-social/Spiritual:  Desire for further Chaplaincy support: Yes - [employer] Additional Recommendations: Education on ETOH abuse   Prognosis: High chronic disease burden, substance abuse - ETOH,   Discharge Planning: Discharge plan is uncertain at this time.  Vitals:   10/31/23 0845 10/31/23 0850  BP: (!) 168/82   Pulse: 95   Resp:    Temp:    SpO2:  (!) 88%    Intake/Output Summary (Last 24 hours) at 10/31/2023 1129 Last data filed at 10/31/2023 0800 Gross per 24 hour  Intake 2327.43 ml  Output 2420 ml  Net -92.57 ml   Last Weight  Most recent update: 10/31/2023  6:06 AM    Weight  107.7 kg (237 lb 7 oz)            Gen:  Elderly Caucasian F chronically ill appearing HEENT: moist mucous membranes CV: Regular rate and irregular rhythm PULM: On  4LPM [place], breathing even and nonlabored ABD: soft/nontender  EXT: (+) edema  Neuro: Somnolent, will arouse  PPS: 20%   This conversation/these recommendations were discussed with patient primary care team, Dr. Bedelia Person  Billing based on MDM: High  Problems Addressed: One acute or chronic illness or injury that poses a threat to life or bodily function  Amount and/or Complexity of Data: Category 3:Discussion of management or test interpretation with external physician/other qualified health care professional/appropriate source (not separately reported)  Risks: Decision regarding hospitalization or escalation of hospital care and Decision not to resuscitate or to de-escalate care because of poor prognosis ______________________________________________________ Lamarr Lulas [place] Palliative Medicine Team Team Cell Phone: [phone] Please utilize secure chat with additional questions, if there is no response within 30 minutes please call the above phone number  Palliative Medicine Team providers are available by phone from 7am to 7pm daily and can be reached through the team cell phone.  Should this patient require assistance outside of these hours, please call the patient's attending physician.

## 2023-11-01 ENCOUNTER — Encounter (HOSPITAL_COMMUNITY): Payer: Self-pay

## 2023-11-01 DIAGNOSIS — Z7189 Other specified counseling: Secondary | ICD-10-CM | POA: Diagnosis not present

## 2023-11-01 DIAGNOSIS — Z515 Encounter for palliative care: Secondary | ICD-10-CM | POA: Diagnosis not present

## 2023-11-01 LAB — COMPREHENSIVE METABOLIC PANEL
ALT: 13 U/L (ref 0–44)
AST: 18 U/L (ref 15–41)
Albumin: 2.1 g/dL — ABNORMAL LOW (ref 3.5–5.0)
Alkaline Phosphatase: 84 U/L (ref 38–126)
Anion gap: 10 (ref 5–15)
BUN: 17 mg/dL (ref 8–23)
CO2: 32 mmol/L (ref 22–32)
Calcium: 8.8 mg/dL — ABNORMAL LOW (ref 8.9–10.3)
Chloride: 97 mmol/L — ABNORMAL LOW (ref 98–111)
Creatinine, Ser: 0.48 mg/dL (ref 0.44–1.00)
GFR, Estimated: >60 mL/min (ref >60)
Glucose, Bld: 120 mg/dL — ABNORMAL HIGH (ref 70–99)
Potassium: 4.1 mmol/L (ref 3.5–5.1)
Sodium: 139 mmol/L (ref 135–145)
Total Bilirubin: 0.9 mg/dL (ref <1.2)
Total Protein: 5.4 g/dL — ABNORMAL LOW (ref 6.5–8.1)

## 2023-11-01 LAB — AMMONIA: Ammonia: 66 umol/L — ABNORMAL HIGH (ref 9–35)

## 2023-11-01 MED ORDER — METHOCARBAMOL 1000 MG/10ML IJ SOLN
1000.0000 mg | Freq: Three times a day (TID) | INTRAMUSCULAR | Status: DC
  Administered 2023-11-01 – 2023-11-10 (×26): 1000 mg via INTRAVENOUS
  Filled 2023-11-01 (×27): qty 10

## 2023-11-01 MED ORDER — ORAL CARE MOUTH RINSE
15.0000 mL | OROMUCOSAL | Status: DC
  Administered 2023-11-01 – 2023-11-04 (×16): 15 mL via OROMUCOSAL

## 2023-11-01 MED ORDER — LEVETIRACETAM IN NACL 500 MG/100ML IV SOLN
500.0000 mg | Freq: Two times a day (BID) | INTRAVENOUS | Status: AC
  Administered 2023-11-01 – 2023-11-03 (×5): 500 mg via INTRAVENOUS
  Filled 2023-11-01 (×5): qty 100

## 2023-11-01 MED ORDER — KCL IN DEXTROSE-NACL 20-5-0.9 MEQ/L-%-% IV SOLN
INTRAVENOUS | Status: AC
  Filled 2023-11-01 (×3): qty 1000

## 2023-11-01 NOTE — Evaluation (Signed)
Clinical/Bedside Swallow Evaluation Patient Details  Name: [name] MRN: [id number] Date of Birth: 1951-09-01  Today's Date: 11/01/2023 Time: SLP Start Time (ACUTE ONLY): 1619 SLP Stop Time (ACUTE ONLY): 1633 SLP Time Calculation (min) (ACUTE ONLY): 14 min  Past Medical History:  Past Medical History:  Diagnosis Date   Anxiety    Atrial fibrillation [facility])    sees Dr. Flora Lipps   Bilateral swelling of feet    Cancer [facility])    breast   Chronic diarrhea    Chronic pain    Concussion 02/2007   ICU x 3 days   Depression    Hypertension    Hypothyroidism    Personal history of chemotherapy    Personal history of radiation therapy    Spinal stenosis    Thyroid disease ?1994   Vitamin D deficiency    Past Surgical History:  Past Surgical History:  Procedure Laterality Date   BREAST BIOPSY Right 12/20/2019   x2   BREAST LUMPECTOMY Right 01/22/2020   BREAST LUMPECTOMY WITH RADIOACTIVE SEED AND SENTINEL LYMPH NODE BIOPSY Right 01/22/2020   Procedure: RIGHT BREAST LUMPECTOMY WITH RADIOACTIVE SEED X2 AND RIGHT SENTINEL LYMPH NODE MAPPING;  Surgeon: Harriette Bouillon, MD;  Location: [facility];  Service: General;  Laterality: Right;   BREAST SURGERY Right 03/1998   breast biopsy, benign   EYE SURGERY Right    PORT-A-CATH REMOVAL Right 04/30/2021   Procedure: REMOVAL PORT-A-CATH;  Surgeon: Harriette Bouillon, MD;  Location: [facility];  Service: General;  Laterality: Right;   PORTACATH PLACEMENT Right 01/22/2020   Procedure: INSERTION PORT-A-CATH WITH ULTRASOUND;  Surgeon: Harriette Bouillon, MD;  Location: [facility];  Service: General;  Laterality: Right;   PORTACATH PLACEMENT Right 02/28/2020   Procedure: PORT A CATH REVISION;  Surgeon: Harriette Bouillon, MD;  Location: [facility];  Service: General;  Laterality: Right;   REVERSE SHOULDER ARTHROPLASTY Left 02/02/2021   Procedure: LEFT REVERSE SHOULDER ARTHROPLASTY;  Surgeon: Cammy Copa,  MD;  Location: [facility] OR;  Service: Orthopedics;  Laterality: Left;   SHOULDER SURGERY Right    SPINAL FUSION  03/04/2011   with ORIF   HPI:  [name] is a 72 yo female presenting to ED 11/7 after losing her balance and falling backwards. W/u revealed L traumatic SAH/contusion without significant mass effect in addition to a T6 burst fx with retropulsion and significant loss of height. PMH includes A-fib, breast cancer, chronic pain, depression, HTN, hypothyroidism, spinal stenosis, A-fib    Assessment / Plan / Recommendation  Clinical Impression  Pt presents with significantly altered mentation in the setting of ETOH withdrawal. Pt's fxs only allow HOB to be adjusted to 30 degrees, which is not ideal for PO intake. Additionally, she is agitated and unable to participate throughout the majority of the evaluation. She has poor awareness of bolus presentations and holds trials orally until she swallows or expectorates them in full. During limited oral manipulation, note pt with increased WOB with RR reaching 37. This session was limited by pt's pain and agitation, but do not feel that it is currently safe for pt to be on a PO diet, nor do I feel that she will receive adequate intake due to current focused attention deficits. Recommend she be made NPO except for ice chips following thorough oral care with consideration of placing AMN for medication administration. Suspect pt's dysphagia is cognitive in nature and will return to baseline as current agitation resolves. Will continue following. SLP  Visit Diagnosis: Dysphagia, unspecified (R13.10)    Aspiration Risk  Moderate aspiration risk    Diet Recommendation NPO;Alternative means - temporary;Ice chips PRN after oral care    Medication Administration: Via alternative means    Other  Recommendations Oral Care Recommendations: Oral care QID;Staff/trained caregiver to provide oral care    Recommendations for follow up therapy are one component  of a multi-disciplinary discharge planning process, led by the attending physician.  Recommendations may be updated based on patient status, additional functional criteria and insurance authorization.  Follow up Recommendations No SLP follow up      Assistance Recommended at Discharge    Functional Status Assessment Patient has had a recent decline in their functional status and demonstrates the ability to make significant improvements in function in a reasonable and predictable amount of time.  Frequency and Duration min 2x/week  1 week       Prognosis Prognosis for improved oropharyngeal function: Good Barriers to Reach Goals: Cognitive deficits;Time post onset;Behavior      Swallow Study   General HPI: AALEXIS [name] is a 73 yo female presenting to ED 11/7 after losing her balance and falling backwards. W/u revealed L traumatic SAH/contusion without significant mass effect in addition to a T6 burst fx with retropulsion and significant loss of height. PMH includes A-fib, breast cancer, chronic pain, depression, HTN, hypothyroidism, spinal stenosis, A-fib Type of Study: Bedside Swallow Evaluation Previous Swallow Assessment: none in chart Diet Prior to this Study: Regular;Thin liquids (Level 0) Temperature Spikes Noted: No Respiratory Status: Nasal cannula History of Recent Intubation: No Behavior/Cognition: Agitated;Impulsive;Uncooperative;Distractible;Requires cueing;Doesn't follow directions Oral Cavity Assessment: Within Functional Limits Oral Care Completed by SLP: No Oral Cavity - Dentition: Adequate natural dentition Vision: Functional for self-feeding Self-Feeding Abilities: Total assist Patient Positioning: Postural control adequate for testing Baseline Vocal Quality: Normal Volitional Cough: Cognitively unable to elicit Volitional Swallow: Unable to elicit    Oral/Motor/Sensory Function Overall Oral Motor/Sensory Function: Within functional limits   Ice Chips Ice  chips: Impaired Presentation: Spoon Oral Phase Impairments: Poor awareness of bolus Oral Phase Functional Implications: Oral holding   Thin Liquid Thin Liquid: Impaired Presentation: Straw;Spoon Oral Phase Impairments: Poor awareness of bolus;Reduced lingual movement/coordination;Reduced labial seal    Nectar Thick Nectar Thick Liquid: Not tested   Honey Thick Honey Thick Liquid: Not tested   Puree Puree: Impaired Presentation: Spoon Oral Phase Impairments: Poor awareness of bolus;Reduced lingual movement/coordination;Reduced labial seal   Solid     Solid: Not tested      Gwynneth Aliment, M.A., CF-SLP Speech Language Pathology, Acute Rehabilitation Services  Secure Chat preferred [phone]  11/01/2023,5:06 PM

## 2023-11-01 NOTE — Progress Notes (Signed)
Pt with decreased urine output throughout the night. Trauma MD notified. No new orders at this time. Will continue to monitor and assess the pt and notify of any changes.

## 2023-11-01 NOTE — Progress Notes (Signed)
Patient ID: [name], female   DOB: Aug 25, 1951, 72 y.o.   MRN: [id number] Follow up - Trauma Critical Care   Patient Details:    [name] is an 72 y.o. female.  Lines/tubes : Implanted Port 09/02/20 (Active)     Urethral Catheter A Isabel Rhody RN Non-latex 14 Fr. (Active)  Indication for Insertion or Continuance of Catheter Unstable critically ill patients first 24-48 hours (See Criteria) 10/31/23 2000  Site Assessment Clean, Dry, Intact 10/31/23 2000  Catheter Maintenance Bag below level of bladder;Catheter secured;Drainage bag/tubing not touching floor;Insertion date on drainage bag;No dependent loops;Seal intact 10/31/23 2000  Collection Container Standard drainage bag 10/31/23 2000  Securement Method Adhesive securement device 10/31/23 2000  Urinary Catheter Interventions (if applicable) Unclamped 10/31/23 2000  Output (mL) 50 mL 11/01/23 0700    Microbiology/Sepsis markers: Results for orders placed or performed during the hospital encounter of 10/27/23  Resp panel by RT-PCR (RSV, Flu A&B, Covid) Anterior Nasal Swab     Status: None   Collection Time: 10/27/23  4:12 PM   Specimen: Anterior Nasal Swab  Result Value Ref Range Status   SARS Coronavirus 2 by RT PCR NEGATIVE NEGATIVE Final   Influenza A by PCR NEGATIVE NEGATIVE Final   Influenza B by PCR NEGATIVE NEGATIVE Final    Comment: (NOTE) The Xpert Xpress SARS-CoV-2/FLU/RSV plus assay is intended as an aid in the diagnosis of influenza from Nasopharyngeal swab specimens and should not be used as a sole basis for treatment. Nasal washings and aspirates are unacceptable for Xpert Xpress SARS-CoV-2/FLU/RSV testing.  Fact Sheet for Patients: BloggerCourse.com  Fact Sheet for Healthcare Providers: SeriousBroker.it  This test is not yet approved or cleared by the [place] FDA and has been authorized for detection and/or diagnosis of SARS-CoV-2 by FDA under  an Emergency Use Authorization (EUA). This EUA will remain in effect (meaning this test can be used) for the duration of the COVID-19 declaration under Section 564(b)(1) of the Act, 21 U.S.C. section 360bbb-3(b)(1), unless the authorization is terminated or revoked.     Resp Syncytial Virus by PCR NEGATIVE NEGATIVE Final    Comment: (NOTE) Fact Sheet for Patients: BloggerCourse.com  Fact Sheet for Healthcare Providers: SeriousBroker.it  This test is not yet approved or cleared by the [place] FDA and has been authorized for detection and/or diagnosis of SARS-CoV-2 by FDA under an Emergency Use Authorization (EUA). This EUA will remain in effect (meaning this test can be used) for the duration of the COVID-19 declaration under Section 564(b)(1) of the Act, 21 U.S.C. section 360bbb-3(b)(1), unless the authorization is terminated or revoked.  Performed at [facility], [address] [place], [place] [address]   MRSA Next Gen by PCR, Nasal     Status: None   Collection Time: 10/27/23  9:27 PM   Specimen: Nasal Mucosa; Nasal Swab  Result Value Ref Range Status   MRSA by PCR Next Gen NOT DETECTED NOT DETECTED Final    Comment: (NOTE) The GeneXpert MRSA Assay (FDA approved for NASAL specimens only), is one component of a comprehensive MRSA colonization surveillance program. It is not intended to diagnose MRSA infection nor to guide or monitor treatment for MRSA infections. Test performance is not FDA approved in patients less than 72 years old. Performed at [facility], [address] [place], [place] [address]     Anti-infectives:  Anti-infectives (From admission, onward)    None        Consults: Treatment  Team:  Tressie Stalker, MD    Studies:    Events:  Subjective:    Overnight Issues: weaned to [place] O2  Objective:  Vital signs for last 24 hours: Temp:  [98.2 F (36.8 C)-99.6 F  (37.6 C)] 98.2 F (36.8 C) (11/12 0800) Pulse Rate:  [63-117] 69 (11/12 0724) Resp:  [13-30] 22 (11/12 0700) BP: (115-186)/(59-110) 153/77 (11/12 0724) SpO2:  [86 %-100 %] 91 % (11/12 0700) Weight:  [106.2 kg] 106.2 kg (11/12 0500)  Hemodynamic parameters for last 24 hours:    Intake/Output from previous day: 11/11 0701 - 11/12 0700 In: 641.6 [I.V.:528.6; IV Piggyback:112.9] Out: 960 [Urine:960]  Intake/Output this shift: No intake/output data recorded.  Vent settings for last 24 hours:    Physical Exam:  General: on [place] [facility]: MAE, talking but delirious HEENT/Neck:  Resp: clear to auscultation bilaterally CVS: RRR GI: soft, NT Extremities: calves soft  Results for orders placed or performed during the hospital encounter of 10/27/23 (from the past 24 hour(s))  Vitamin B12     Status: Abnormal   Collection Time: 10/31/23 11:37 AM  Result Value Ref Range   Vitamin B-12 916 (H) 180 - 914 pg/mL  Folate     Status: None   Collection Time: 10/31/23 11:37 AM  Result Value Ref Range   Folate 17.5 >5.9 ng/mL    Assessment & Plan: Present on Admission:  Closed unstable burst fracture of T5 vertebra (HCC)    LOS: 5 days   Additional comments:I reviewed the patient's new clinical lab test results. / 39F s/p fall   SAH, IVH - NSGY c/s, Dr. Jake Samples, repeat head CT reviewed, keppra x7d, SLP c/s AF on eliquis - rec'd Andexxa, hold for now Subacute T5 burst fx - MRI completed, Being managed pre-admission by Dr. Lovell Sheehan. Brace at bedside. Okay to ambulate, monitor for neuro sx after ambulation. ETOH withdrawl - precedex, psych c/s with med adjustment - cymbalta 60 BID, buspirone 15 TID, quetiapine 25 at bedtime, trazodone 100 at bedtime, high dose thiamine, CIWA R 6th rib - pain control, pulm toilet  FEN- reg diet but not taking anything in, resume IVF, ST re-eval VTE- SCDs ID- no issues Dispo- ICU, therapies, ST re-eval Critical Care Total Time*: 33 Minutes  [name], MD, MPH, FACS Trauma & General Surgery Use [email or web address] to contact on call provider  11/01/2023  *Care during the described time interval was provided by me. I have reviewed this patient's available data, including medical history, events of note, physical examination and test results as part of my evaluation.

## 2023-11-01 NOTE — Consult Note (Signed)
  Attempted to assess patient however unsuccessful. Per nursing patient is now on Precedex due to being NPO and not able to tolerate Po meds. Staff nurse reports that she has not unable to follow commands today and difficult to communicate when awake. She also reports that patients eyes were noted to be jaundice and they have ordered ammonia and HFP.    Ammonia is elevated at 66--> will defer to primary team.  Psychiatry will continue to monitor at this time.

## 2023-11-01 NOTE — Progress Notes (Signed)
Subjective: The patient continues to be sedated and agitated.  By report she drank quite a bit of alcohol.  Objective: Vital signs in last 24 hours: Temp:  [98.2 F (36.8 C)-99.6 F (37.6 C)] 98.2 F (36.8 C) (11/12 0800) Pulse Rate:  [63-117] 69 (11/12 0724) Resp:  [13-30] 22 (11/12 0700) BP: (115-186)/(59-110) 153/77 (11/12 0724) SpO2:  [86 %-100 %] 91 % (11/12 0700) Weight:  [106.2 kg] 106.2 kg (11/12 0500) Estimated body mass index is 36.67 kg/m as calculated from the following:   Height as of this encounter: 5\' 7"  (1.702 m).   Weight as of this encounter: 106.2 kg.   Intake/Output from previous day: 11/11 0701 - 11/12 0700 In: 641.6 [I.V.:528.6; IV Piggyback:112.9] Out: 960 [Urine:960] Intake/Output this shift: No intake/output data recorded.  Physical exam the patient is sedated but arousable.  She is moving all 4 extremities well.  Lab Results: Recent Labs    10/30/23 0519 10/30/23 1654  WBC 9.2  --   HGB 12.3 12.6  HCT 39.6 37.0  PLT 225  --    BMET Recent Labs    10/30/23 0519 10/30/23 1654  NA 140 139  K 3.6 3.6  CL 94*  --   CO2 36*  --   GLUCOSE 129*  --   BUN 10  --   CREATININE 0.44  --   CALCIUM 8.8*  --     Studies/Results: DG CHEST PORT 1 VIEW  Result Date: 10/30/2023 CLINICAL DATA:  [id number] SOB (shortness of breath) [id number] EXAM: PORTABLE CHEST 1 VIEW COMPARISON:  Chest x-ray 10/27/2023 FINDINGS: The heart and mediastinal contours are unchanged. Atherosclerotic plaque. Interval development of diffuse interstitial airspace opacities, right greater than left, with as well development of patchy right mid to lower lung zone airspace opacities. No pleural effusion. No pneumothorax. No acute osseous abnormality. Reversed total left shoulder arthroplasty. IMPRESSION: Interval development of diffuse interstitial airspace opacities, right greater than left, with as well development of patchy right mid to lower lung zone airspace opacities. Followup PA  and lateral chest X-ray is recommended in 3-4 weeks following therapy to ensure resolution and exclude underlying malignancy. Electronically Signed   By: Tish Frederickson M.D.   On: 10/30/2023 17:55    Assessment/Plan: Alcohol withdrawal, T6 fracture: The patient can have the head of bed elevated up to 30 degrees without her brace.  She should be logrolled.  She will need her TLSO donned while supine if she is sitting up or ambulating.  LOS: 5 days     Cristi Loron 11/01/2023, 8:09 AM

## 2023-11-01 NOTE — Progress Notes (Signed)
Palliative Medicine Inpatient Follow Up Note   HPI: 72 yo female was walking and lost balance and fell backward. She complains of intense pain in her back. She takes eliquis for atrial fibrillation.    [name] has been asked to get involved to further support goals of care conversations.   Today's Discussion 11/01/2023  *Please note that this is a verbal dictation therefore any spelling or grammatical errors are due to the "Dragon [employer] One" system interpretation.  Chart reviewed inclusive of vital signs, progress notes, laboratory results, and diagnostic images.   A family meeting was held this morning with [facility] spouse, [name], & daughter, [name].  Dr. Janee Morn, Lequita Halt, RN, and myself were present for the meeting.  Dr. Janee Morn was able to provide a medical update in the setting of patient ongoing delirium and withdrawal. He shared that it may take time to see improvements and that his hope would be once off sedative she can start to mobilize with PT/OT.  After Dr. Janee Morn left I was able to discuss with family patients delirium and what may have predisposed her to this. We talked at length about withdrawal and how patient may have been using alcohol to mute her notable anxiety. We reviewed the plan for ongoing management of her withdrawal symptoms and strict delirium precautions.  We discussed that after a prolong hospitalization and a significant head injury it is not clear what the patients new baseline will or won't be. We reviewed the importance of first getting her to a point where she is less agitated and more coherent. Patients spouse shares she had a similar hospitalization last year whereby she withdrew from alcohol which took about a week for her to clear cognitively. I shared that would be the hope though she also has and SAH which may cause her recovery/improvement to be slower.  We discussed best case and worst case scenarios. Patients family are holding on to hope  that she will clear and be able to participate in PT/OT and speech therapy. We discussed the plan for a speech evaluation and if patient does not pass because of her impaired state the idea of a coretrack feeding tube. We reviewed the potential risk for aspiration given patients present condition. Both patients spouse and daughter are in agreement with coretrack placement for short term.    We reviewed if patient should neglect to improve and be in a state of persistent confusion and disorientation long term such as with Wernicke encephalopathy. We reviewed what the considerations would be then. Patient family share that she is a Tax adviser independent lady and would likely not desire being dependent on others for simple activities. I did broach the topic of hospice should they be faced with this reality. Patients spouse and daughter share "we aren't there". I said, no we are not but we must talk about worst case scenarios.   Created space and opportunity for patients family to explore thoughts feelings and fears regarding her current medical situation. They are hopeful and optimistic for improvement. They are in a state of disbelief in regard to patients ongoing ETOH consumption. Provided support to them in the form of therapeutic listening.   Questions and concerns addressed/Palliative Support Provided.   Objective Assessment: Vital Signs Vitals:   11/01/23 0900 11/01/23 1200  BP: (!) 171/94   Pulse: 72   Resp: (!) 23   Temp:  (!) 97.5 F (36.4 C)  SpO2: 90%     Intake/Output Summary (Last 24 hours) at  11/01/2023 1446 Last data filed at 11/01/2023 0800 Gross per 24 hour  Intake 337.49 ml  Output 555 ml  Net -217.51 ml   Last Weight  Most recent update: 11/01/2023  7:42 AM    Weight  106.2 kg (234 lb 2.1 oz)            Gen:  Elderly Caucasian F chronically ill appearing HEENT: moist mucous membranes CV: Regular rate and irregular rhythm PULM: On 6LPM [place], breathing even and  nonlabored ABD: soft/nontender  EXT: (+) edema  Neuro: Somnolent, will arouse  SUMMARY OF RECOMMENDATIONS   DNAR/DNI  Patients family are agreeable to a coretrack  Open and honest conversations held in the setting of patients SAH, ETOH abuse, and delirium  Best case and worst case scenarios reviewed  Ongoing PMT support --> I plan to follow up on Sunday though if support is needed sooner please do not hesitate to reach out to our service  Time Spent: 65 Billing based on MDM: High ______________________________________________________________________________________ Lamarr Lulas [place] Palliative Medicine Team Team Cell Phone: [phone] Please utilize secure chat with additional questions, if there is no response within 30 minutes please call the above phone number  Palliative Medicine Team providers are available by phone from 7am to 7pm daily and can be reached through the team cell phone.  Should this patient require assistance outside of these hours, please call the patient's attending physician.

## 2023-11-02 ENCOUNTER — Inpatient Hospital Stay (HOSPITAL_COMMUNITY): Payer: Medicare PPO

## 2023-11-02 LAB — BASIC METABOLIC PANEL
Anion gap: 8 (ref 5–15)
BUN: 10 mg/dL (ref 8–23)
CO2: 31 mmol/L (ref 22–32)
Calcium: 8.8 mg/dL — ABNORMAL LOW (ref 8.9–10.3)
Chloride: 101 mmol/L (ref 98–111)
Creatinine, Ser: 0.69 mg/dL (ref 0.44–1.00)
GFR, Estimated: >60 mL/min (ref >60)
Glucose, Bld: 134 mg/dL — ABNORMAL HIGH (ref 70–99)
Potassium: 3.6 mmol/L (ref 3.5–5.1)
Sodium: 140 mmol/L (ref 135–145)

## 2023-11-02 LAB — GLUCOSE, CAPILLARY
Glucose-Capillary: 115 mg/dL — ABNORMAL HIGH (ref 70–99)
Glucose-Capillary: 127 mg/dL — ABNORMAL HIGH (ref 70–99)
Glucose-Capillary: 132 mg/dL — ABNORMAL HIGH (ref 70–99)
Glucose-Capillary: 182 mg/dL — ABNORMAL HIGH (ref 70–99)

## 2023-11-02 LAB — POCT I-STAT 7, (LYTES, BLD GAS, ICA,H+H)
Acid-Base Excess: 4 mmol/L — ABNORMAL HIGH (ref 0.0–2.0)
Bicarbonate: 32.3 mmol/L — ABNORMAL HIGH (ref 20.0–28.0)
Calcium, Ion: 1.27 mmol/L (ref 1.15–1.40)
HCT: 33 % — ABNORMAL LOW (ref 36.0–46.0)
Hemoglobin: 11.2 g/dL — ABNORMAL LOW (ref 12.0–15.0)
O2 Saturation: 100 %
Patient temperature: 97.6
Potassium: 3.1 mmol/L — ABNORMAL LOW (ref 3.5–5.1)
Sodium: 140 mmol/L (ref 135–145)
TCO2: 34 mmol/L — ABNORMAL HIGH (ref 22–32)
pCO2 arterial: 65.6 mmHg (ref 32–48)
pH, Arterial: 7.297 — ABNORMAL LOW (ref 7.35–7.45)
pO2, Arterial: 287 mmHg — ABNORMAL HIGH (ref 83–108)

## 2023-11-02 LAB — CBC
HCT: 37.4 % (ref 36.0–46.0)
Hemoglobin: 11.9 g/dL — ABNORMAL LOW (ref 12.0–15.0)
MCH: 33.2 pg (ref 26.0–34.0)
MCHC: 31.8 g/dL (ref 30.0–36.0)
MCV: 104.5 fL — ABNORMAL HIGH (ref 80.0–100.0)
Platelets: 188 10*3/uL (ref 150–400)
RBC: 3.58 MIL/uL — ABNORMAL LOW (ref 3.87–5.11)
RDW: 12.6 % (ref 11.5–15.5)
WBC: 5.8 10*3/uL (ref 4.0–10.5)
nRBC: 0 % (ref 0.0–0.2)

## 2023-11-02 LAB — VITAMIN B1: Vitamin B1 (Thiamine): 97.9 nmol/L (ref 66.5–200.0)

## 2023-11-02 LAB — PHOSPHORUS: Phosphorus: 3.8 mg/dL (ref 2.5–4.6)

## 2023-11-02 LAB — MAGNESIUM: Magnesium: 1.7 mg/dL (ref 1.7–2.4)

## 2023-11-02 MED ORDER — TRAZODONE HCL 50 MG PO TABS: 100.0000 mg | ORAL_TABLET | Freq: Every day | ORAL | Status: DC

## 2023-11-02 MED ORDER — PROSOURCE TF20 ENFIT COMPATIBL EN LIQD
60.0000 mL | Freq: Every day | ENTERAL | Status: DC
  Administered 2023-11-02: 60 mL
  Filled 2023-11-02: qty 60

## 2023-11-02 MED ORDER — POLYETHYLENE GLYCOL 3350 17 G PO PACK: 17.0000 g | PACK | Freq: Every day | ORAL | Status: DC | PRN

## 2023-11-02 MED ORDER — ACETAMINOPHEN 500 MG PO TABS
1000.0000 mg | ORAL_TABLET | Freq: Four times a day (QID) | ORAL | Status: DC
  Administered 2023-11-02 – 2023-11-10 (×29): 1000 mg
  Filled 2023-11-02 (×30): qty 2

## 2023-11-02 MED ORDER — OXYCODONE HCL 5 MG PO TABS
5.0000 mg | ORAL_TABLET | ORAL | Status: DC | PRN
  Administered 2023-11-02 – 2023-11-04 (×2): 10 mg
  Administered 2023-11-04: 5 mg
  Filled 2023-11-02 (×3): qty 2

## 2023-11-02 MED ORDER — NALOXONE HCL 0.4 MG/ML IJ SOLN
INTRAMUSCULAR | Status: AC
  Administered 2023-11-02: 0.4 mg via INTRAVENOUS
  Filled 2023-11-02: qty 1

## 2023-11-02 MED ORDER — CARVEDILOL 12.5 MG PO TABS
25.0000 mg | ORAL_TABLET | Freq: Two times a day (BID) | ORAL | Status: DC
  Administered 2023-11-02 – 2023-11-09 (×11): 25 mg
  Filled 2023-11-02 (×13): qty 2

## 2023-11-02 MED ORDER — THIAMINE HCL 100 MG/ML IJ SOLN
200.0000 mg | INTRAVENOUS | Status: AC
  Administered 2023-11-04 – 2023-11-06 (×3): 200 mg via INTRAVENOUS
  Filled 2023-11-02 (×3): qty 2

## 2023-11-02 MED ORDER — RACEPINEPHRINE HCL 2.25 % IN NEBU
INHALATION_SOLUTION | RESPIRATORY_TRACT | Status: AC
  Filled 2023-11-02: qty 0.5

## 2023-11-02 MED ORDER — LOSARTAN POTASSIUM 50 MG PO TABS
100.0000 mg | ORAL_TABLET | Freq: Every day | ORAL | Status: DC
  Administered 2023-11-02 – 2023-11-04 (×3): 100 mg
  Filled 2023-11-02 (×3): qty 2

## 2023-11-02 MED ORDER — PIVOT 1.5 CAL PO LIQD
1000.0000 mL | ORAL | Status: DC
Start: 2023-11-02 — End: 2023-11-02
  Administered 2023-11-02: 1000 mL

## 2023-11-02 MED ORDER — LEVOTHYROXINE SODIUM 75 MCG PO TABS
150.0000 ug | ORAL_TABLET | Freq: Every day | ORAL | Status: DC
  Administered 2023-11-02 – 2023-11-10 (×9): 150 ug
  Filled 2023-11-02 (×8): qty 2

## 2023-11-02 MED ORDER — PHENOBARBITAL 32.4 MG PO TABS
64.8000 mg | ORAL_TABLET | Freq: Three times a day (TID) | ORAL | Status: DC
  Administered 2023-11-02: 64.8 mg
  Filled 2023-11-02: qty 2

## 2023-11-02 MED ORDER — PIVOT 1.5 CAL PO LIQD
1000.0000 mL | ORAL | Status: DC
  Administered 2023-11-02 – 2023-11-10 (×9): 1000 mL

## 2023-11-02 MED ORDER — NALOXONE HCL 0.4 MG/ML IJ SOLN: 0.4000 mg | INTRAMUSCULAR | Status: DC | PRN

## 2023-11-02 MED ORDER — ALBUTEROL SULFATE (2.5 MG/3ML) 0.083% IN NEBU
INHALATION_SOLUTION | RESPIRATORY_TRACT | Status: AC
  Filled 2023-11-02: qty 12

## 2023-11-02 MED ORDER — SENNA 8.6 MG PO TABS
2.0000 | ORAL_TABLET | Freq: Two times a day (BID) | ORAL | Status: DC
  Administered 2023-11-02 – 2023-11-03 (×2): 17.2 mg
  Filled 2023-11-02 (×2): qty 2

## 2023-11-02 MED ORDER — QUETIAPINE FUMARATE 25 MG PO TABS
25.0000 mg | ORAL_TABLET | Freq: Two times a day (BID) | ORAL | Status: DC
  Administered 2023-11-02: 25 mg
  Filled 2023-11-02: qty 1

## 2023-11-02 MED ORDER — PHENOBARBITAL 32.4 MG PO TABS: 32.4000 mg | ORAL_TABLET | Freq: Three times a day (TID) | ORAL | Status: DC

## 2023-11-02 MED ORDER — BUSPIRONE HCL 15 MG PO TABS
15.0000 mg | ORAL_TABLET | Freq: Three times a day (TID) | ORAL | Status: DC
  Administered 2023-11-02 – 2023-11-10 (×24): 15 mg
  Filled 2023-11-02 (×24): qty 1

## 2023-11-02 MED ORDER — FOLIC ACID 1 MG PO TABS
1.0000 mg | ORAL_TABLET | Freq: Every day | ORAL | Status: DC
  Administered 2023-11-02 – 2023-11-10 (×9): 1 mg
  Filled 2023-11-02 (×9): qty 1

## 2023-11-02 MED ORDER — THIAMINE MONONITRATE 100 MG PO TABS: 100.0000 mg | ORAL_TABLET | Freq: Every day | ORAL | Status: DC

## 2023-11-02 MED ORDER — LORAZEPAM 2 MG/ML IJ SOLN
1.0000 mg | INTRAMUSCULAR | Status: DC | PRN
  Administered 2023-11-02 – 2023-11-04 (×8): 1 mg via INTRAVENOUS
  Filled 2023-11-02 (×8): qty 1

## 2023-11-02 MED ORDER — ADULT MULTIVITAMIN W/MINERALS CH
1.0000 | ORAL_TABLET | Freq: Every day | ORAL | Status: DC
Start: 2023-11-02 — End: 2023-11-10
  Administered 2023-11-02 – 2023-11-10 (×9): 1
  Filled 2023-11-02 (×9): qty 1

## 2023-11-02 MED ORDER — POLYETHYLENE GLYCOL 3350 17 G PO PACK
17.0000 g | PACK | Freq: Every day | ORAL | Status: DC
  Administered 2023-11-02 – 2023-11-09 (×3): 17 g
  Filled 2023-11-02 (×3): qty 1

## 2023-11-02 MED ORDER — TRAZODONE HCL 50 MG PO TABS
50.0000 mg | ORAL_TABLET | Freq: Every evening | ORAL | Status: DC | PRN
  Administered 2023-11-02: 50 mg
  Filled 2023-11-02: qty 1

## 2023-11-02 NOTE — Progress Notes (Signed)
Subjective: The patient is agitated and sedated.  Objective: Vital signs in last 24 hours: Temp:  [97.5 F (36.4 C)-99.4 F (37.4 C)] 98.4 F (36.9 C) (11/13 0400) Pulse Rate:  [69-128] 101 (11/13 0455) Resp:  [20-33] 22 (11/13 0300) BP: (129-198)/(59-126) 177/72 (11/13 0455) SpO2:  [90 %-100 %] 100 % (11/13 0400) FiO2 (%):  [40 %] 40 % (11/13 0809) Estimated body mass index is 36.67 kg/m as calculated from the following:   Height as of this encounter: 5\' 7"  (1.702 m).   Weight as of this encounter: 106.2 kg.   Intake/Output from previous day: 11/12 0701 - 11/13 0700 In: 1868.4 [I.V.:1568.4; IV Piggyback:300] Out: 515 [Urine:515] Intake/Output this shift: No intake/output data recorded.  Physical exam the patient is somnolent but easily aroused and agitated.  She is purposeful and moves all 4 extremities.  Lab Results: Recent Labs    10/30/23 1654 11/02/23 0544  WBC  --  5.8  HGB 12.6 11.9*  HCT 37.0 37.4  PLT  --  188   BMET Recent Labs    11/01/23 1306 11/02/23 0544  NA 139 140  K 4.1 3.6  CL 97* 101  CO2 32 31  GLUCOSE 120* 134*  BUN 17 10  CREATININE 0.48 0.69  CALCIUM 8.8* 8.8*    Studies/Results: No results found.  Assessment/Plan: Traumatic subarachnoid hemorrhage, T6 fracture: Continue supportive care.  She can be mobilized with her TLSO when her mental status permits.  Alcohol withdrawal: Noted  LOS: 6 days     [name] 11/02/2023, 8:57 AM

## 2023-11-02 NOTE — Plan of Care (Signed)
  Problem: Health Behavior/Discharge Planning: Goal: Ability to manage health-related needs will improve 11/02/2023 0719 by Candace Gallus, RN Outcome: Not Progressing 11/02/2023 0718 by Candace Gallus, RN Outcome: Not Progressing

## 2023-11-02 NOTE — Procedures (Signed)
Cortrak  Person Inserting Tube:  Greig Castilla D, RD Tube Type:  Cortrak - 55 inches Tube Size:  10 Tube Location:  Left nare Secured by: Bridle Technique Used to Measure Tube Placement:  Marking at nare/corner of mouth Cortrak Secured At:  73 cm Procedure Comments:  Cortrak Tube Team Note:  Consult received to place a Cortrak feeding tube.   X-ray is required, abdominal x-ray has been ordered by the Cortrak team. Please confirm tube placement before using the Cortrak tube.   If the tube becomes dislodged please keep the tube and contact the Cortrak team at [email or web address] for replacement.  If after hours and replacement cannot be delayed, place a NG tube and confirm placement with an abdominal x-ray.    Greig Castilla, RD, LDN Registered Dietitian II RD pager # available in AMION  After hours/weekend pager # available in [facility]

## 2023-11-02 NOTE — Plan of Care (Signed)
  Problem: Health Behavior/Discharge Planning: Goal: Ability to manage health-related needs will improve Outcome: Not Progressing   

## 2023-11-02 NOTE — Progress Notes (Signed)
SLP Cancellation Note  Patient Details Name: HELOISE SCHOENWETTER MRN: [id number] DOB: 1951/11/20   Cancelled treatment:       Reason Eval/Treat Not Completed: Fatigue/lethargy and agitation limiting ability to participate. SLP will continue following to assess readiness to participate in PO trials.    Gwynneth Aliment, M.A., CF-SLP Speech Language Pathology, Acute Rehabilitation Services  Secure Chat preferred [phone]  11/02/2023, 10:09 AM

## 2023-11-02 NOTE — Consult Note (Signed)
  Attempted to assess patient however unsuccessful. Patient remains on Precedex at this time. Nursing at bedside continues to report that her speech is incomprehensible, slurred, and moaning when awake. She remains disoriented and confused.  She continues to lack inability to follow commands. She is observed to be in bilateral wrist restraints and (1) mitten on R hand. Attempts to awaken patient with name calling unsuccessful, sternal rub provoked minimal response, and some audible snoring and irregular breathing.   Although unable to evaluate patient her symptomology and nursing assessment seem to be more consistent with WKS. At this time will continue to treat with thiamine 500mg  IV x 1 dose. Will start order for Thiamine 200mg  IV daily x 3 days following. Will dc Seroquel at this time---> sedating and patient difficult to arouse. Will reduce Trazodone 50mg  po at bedtime prn.    Psychiatry will continue to monitor at this time.

## 2023-11-02 NOTE — Progress Notes (Signed)
Physical Therapy Treatment Patient Details Name: [name] MRN: [id number] DOB: 04-Apr-1951 Today's Date: 11/02/2023   History of Present Illness 72 year old female with chronic A-fib on Eliquis, who lost her balance and fell backward and hit her head.  Workup revealed left traumatic subarachnoid hemorrhage/contusion without significant mass effect.  There is also a T6 burst fracture with retropulsion and significant loss of height.    PT Comments  Patient progressing to EOB this session during PT/OT co-treat.  She was lethargic and only opened eyes a couple of times during session and followed no commands.  She needed total A with mobility for rolling in bed to place TLSO and for sit<sit.  She was able to sit EOB for approx 10 minutes working on washing face and oral care with total A.  She progressing with sitting balance to mod A at times.  She will continue to benefit from skilled PT in the acute setting and from follow up inpatient rehab (<3 hours/day) at d/c.     If plan is discharge home, recommend the following: Two people to help with walking and/or transfers;Two people to help with bathing/dressing/bathroom   Can travel by private vehicle     No  Equipment Recommendations  None recommended by PT    Recommendations for Other Services       Precautions / Restrictions Precautions Precautions: Fall;Back Precaution Booklet Issued: No Precaution Comments: TLSO donned in supine, on in bed if HOB >30*. Bilateral restraints, coretrak Required Braces or Orthoses: Spinal Brace Spinal Brace: Thoracolumbosacral orthotic;Applied in supine position Restrictions Weight Bearing Restrictions: No     Mobility  Bed Mobility Overal bed mobility: Needs Assistance Bed Mobility: Rolling, Sidelying to Sit, Sit to Sidelying Rolling: Total assist, +2 for physical assistance Sidelying to sit: Total assist, +2 for physical assistance     Sit to sidelying: Total assist, +2 for physical  assistance General bed mobility comments: instructions provided on log rolling and technique    Transfers                   General transfer comment: NT, pt too lethargic    Ambulation/Gait                   Stairs             Wheelchair Mobility     Tilt Bed    Modified Rankin (Stroke Patients Only)       Balance Overall balance assessment: Needs assistance Sitting-balance support: Feet supported Sitting balance-Leahy Scale: Poor Sitting balance - Comments: mod to max assist for sitting balance on EOB                                    Cognition Arousal: Obtunded Behavior During Therapy: Flat affect Overall Cognitive Status: Difficult to assess                                 General Comments: moaning, not answering questions or following commands        Exercises      General Comments General comments (skin integrity, edema, etc.): HR 84-92 during session, SpO2 90's throughout on 4L O2, BP supine 115/65, sitting 146/63      Pertinent Vitals/Pain Pain Assessment Pain Assessment: Faces Faces Pain Scale: Hurts even more Pain Location: generalized with mobility Pain Descriptors /  Indicators: Moaning, Guarding, Grimacing Pain Intervention(s): Monitored during session, Repositioned, Limited activity within patient's tolerance    Home Living                          Prior Function            PT Goals (current goals can now be found in the care plan section) Progress towards PT goals: Progressing toward goals    Frequency    Min 1X/week      PT Plan      Co-evaluation PT/OT/SLP Co-Evaluation/Treatment: Yes Reason for Co-Treatment: Complexity of the patient's impairments (multi-system involvement);To address functional/ADL transfers;For patient/therapist safety PT goals addressed during session: Mobility/safety with mobility;Balance OT goals addressed during session:  Strengthening/ROM;ADL's and self-care      AM-PAC PT "6 Clicks" Mobility   Outcome Measure  Help needed turning from your back to your side while in a flat bed without using bedrails?: Total Help needed moving from lying on your back to sitting on the side of a flat bed without using bedrails?: Total Help needed moving to and from a bed to a chair (including a wheelchair)?: Total Help needed standing up from a chair using your arms (e.g., wheelchair or bedside chair)?: Total Help needed to walk in hospital room?: Total Help needed climbing 3-5 steps with a railing? : Total 6 Click Score: 6    End of Session Equipment Utilized During Treatment: Back brace;Oxygen Activity Tolerance: Patient limited by fatigue Patient left: in bed;with call bell/phone within reach;with bed alarm set   PT Visit Diagnosis: Other abnormalities of gait and mobility (R26.89);Muscle weakness (generalized) (M62.81);Other symptoms and signs involving the nervous system (R29.898)     Time: 1610-9604 PT Time Calculation (min) (ACUTE ONLY): 40 min  Charges:    $Therapeutic Activity: 8-22 mins PT General Charges $$ ACUTE PT VISIT: 1 Visit                     Sheran Lawless, PT Acute Rehabilitation Services Office:[phone] 11/02/2023    [name] 11/02/2023, 1:17 PM

## 2023-11-02 NOTE — Progress Notes (Signed)
Occupational Therapy Treatment Patient Details Name: [name] MRN: [id number] DOB: 1951/04/04 Today's Date: 11/02/2023   History of present illness 72 year old female with chronic A-fib on Eliquis, who lost her balance and fell backward and hit her head.  Workup revealed left traumatic subarachnoid hemorrhage/contusion without significant mass effect.  There is also a T6 burst fracture with retropulsion and significant loss of height.   OT comments  Patient seen with PT to progress to EOB. TLSO applied in supine with patient rolling with total assist and assist of 2 for side lying to sitting on EOB. Patient tolerated approximately 10 minutes of EOB sitting with oral care and face washing performed with total care. Patient will benefit from continued inpatient follow up therapy, <3 hours/day to continue to address bed mobility, self care, and functional transfers. Acute OT to continue to follow.       If plan is discharge home, recommend the following:  Two people to help with walking and/or transfers;Two people to help with bathing/dressing/bathroom;Supervision due to cognitive status;Assistance with feeding;Assist for transportation;Help with stairs or ramp for entrance;Direct supervision/assist for financial management;Direct supervision/assist for medications management   Equipment Recommendations  Other (comment) (defer to next venue of care)    Recommendations for Other Services      Precautions / Restrictions Precautions Precautions: Fall;Back Precaution Comments: TLSO donned in supine, on in bed if HOB >30*. Bilateral restraints, coretrak Required Braces or Orthoses: Spinal Brace Spinal Brace: Thoracolumbosacral orthotic;Applied in supine position Restrictions Weight Bearing Restrictions: No       Mobility Bed Mobility Overal bed mobility: Needs Assistance Bed Mobility: Rolling, Sidelying to Sit, Sit to Sidelying Rolling: Total assist, +2 for physical  assistance Sidelying to sit: Total assist, +2 for physical assistance     Sit to sidelying: Total assist, +2 for physical assistance General bed mobility comments: instructions provided on log rolling and technique    Transfers                   General transfer comment: deferred     Balance Overall balance assessment: History of Falls, Needs assistance Sitting-balance support: Feet supported Sitting balance-Leahy Scale: Poor Sitting balance - Comments: mod to max assist for sitting balance on EOB       Standing balance comment: not attempted                           ADL either performed or assessed with clinical judgement   ADL Overall ADL's : Needs assistance/impaired     Grooming: Wash/dry face;Total assistance;Sitting;Bed level;Oral care Grooming Details (indicate cue type and reason): total assist at bed level, attempted [facility] assist seated on EOB             Lower Body Dressing: Total assistance;Bed level Lower Body Dressing Details (indicate cue type and reason): socks               General ADL Comments: total assist for self care    Extremity/Trunk Assessment              Vision       Perception     Praxis      Cognition Arousal: Obtunded Behavior During Therapy: Flat affect Overall Cognitive Status: No family/caregiver present to determine baseline cognitive functioning Area of Impairment: Orientation, Attention, Memory, Following commands, Safety/judgement, Problem solving, Awareness                 Orientation Level:  Disoriented to, Place, Time, Situation Current Attention Level: Focused Memory: Decreased short-term memory, Decreased recall of precautions Following Commands: Follows one step commands inconsistently Safety/Judgement: Decreased awareness of safety, Decreased awareness of deficits   Problem Solving: Slow processing, Decreased initiation, Difficulty sequencing, Requires verbal cues, Requires  tactile cues General Comments: moaning, not answering questions        Exercises      Shoulder Instructions       General Comments HR 84-92 during session, SpO2 90's throughout on 4L O2, BP supine 115/65, sitting 146/63    Pertinent Vitals/ Pain       Pain Assessment Pain Assessment: Faces Faces Pain Scale: Hurts even more Pain Location: generalized with mobility Pain Descriptors / Indicators: Moaning, Guarding, Grimacing Pain Intervention(s): Limited activity within patient's tolerance, Monitored during session, Premedicated before session, Repositioned  Home Living                                          Prior Functioning/Environment              Frequency  Min 1X/week        Progress Toward Goals  OT Goals(current goals can now be found in the care plan section)  Progress towards OT goals: Progressing toward goals  Acute Rehab OT Goals Patient Stated Goal: unable OT Goal Formulation: Patient unable to participate in goal setting Time For Goal Achievement: 11/14/23 Potential to Achieve Goals: Fair ADL Goals Pt Will Perform Grooming: with set-up;sitting Pt Will Perform Upper Body Bathing: with supervision;sitting Pt Will Transfer to Toilet: with mod assist;ambulating;regular height toilet;bedside commode Pt Will Perform Toileting - Clothing Manipulation and hygiene: with min assist;sitting/lateral leans;sit to/from stand Additional ADL Goal #1: Pt will increase bed mobility by transitioning from sidelying to sitting EOB with Min A with VC for maintaining back precautions in order to participate in self care task. Additional ADL Goal #2: Pt will demonstrate improved cognitive function by accurately orienting to person, place, and time; following two-step directions; and identifyinig their current situation and surroundings with min VC from therapist during structured activity.  Plan      Co-evaluation    PT/OT/SLP Co-Evaluation/Treatment:  Yes Reason for Co-Treatment: Complexity of the patient's impairments (multi-system involvement);To address functional/ADL transfers;For patient/therapist safety PT goals addressed during session: Mobility/safety with mobility;Balance OT goals addressed during session: Strengthening/ROM;ADL's and self-care      AM-PAC OT "6 Clicks" Daily Activity     Outcome Measure   Help from another person eating meals?: Total Help from another person taking care of personal grooming?: Total Help from another person toileting, which includes using toliet, bedpan, or urinal?: Total Help from another person bathing (including washing, rinsing, drying)?: Total Help from another person to put on and taking off regular upper body clothing?: Total Help from another person to put on and taking off regular lower body clothing?: Total 6 Click Score: 6    End of Session Equipment Utilized During Treatment: Oxygen;Back brace  OT Visit Diagnosis: Unsteadiness on feet (R26.81);Muscle weakness (generalized) (M62.81);History of falling (Z91.81);Other symptoms and signs involving cognitive function;Pain Pain - part of body:  (back)   Activity Tolerance Patient limited by lethargy   Patient Left in bed;with call bell/phone within reach;with bed alarm set;with SCD's reapplied   Nurse Communication Mobility status        Time: 7829-5621 OT Time Calculation (min): 39 min  Charges: OT General Charges $OT Visit: 1 Visit OT Treatments $Self Care/Home Management : 8-22 mins $Therapeutic Activity: 8-22 mins  Alfonse Flavors, OTA Acute Rehabilitation Services  Office [phone]   Dewain Penning 11/02/2023, 1:54 PM

## 2023-11-02 NOTE — Progress Notes (Signed)
Patient ID: [name], female   DOB: 06-02-1951, 72 y.o.   MRN: [id number] Follow up - Trauma Critical Care   Patient Details:    [name] is an 72 y.o. female.  Lines/tubes : Implanted Port 09/02/20 (Active)     Urethral Catheter A Duffy Rhody RN Non-latex 14 Fr. (Active)  Indication for Insertion or Continuance of Catheter Unstable critically ill patients first 24-48 hours (See Criteria) 11/01/23 2000  Site Assessment Clean, Dry, Intact 11/02/23 0000  Catheter Maintenance Bag below level of bladder 11/01/23 2000  Collection Container Standard drainage bag 11/02/23 0000  Securement Method Adhesive securement device 11/02/23 0000  Urinary Catheter Interventions (if applicable) Unclamped 11/01/23 0800  Output (mL) 100 mL 11/02/23 0551    Microbiology/Sepsis markers: Results for orders placed or performed during the hospital encounter of 10/27/23  Resp panel by RT-PCR (RSV, Flu A&B, Covid) Anterior Nasal Swab     Status: None   Collection Time: 10/27/23  4:12 PM   Specimen: Anterior Nasal Swab  Result Value Ref Range Status   SARS Coronavirus 2 by RT PCR NEGATIVE NEGATIVE Final   Influenza A by PCR NEGATIVE NEGATIVE Final   Influenza B by PCR NEGATIVE NEGATIVE Final    Comment: (NOTE) The Xpert Xpress SARS-CoV-2/FLU/RSV plus assay is intended as an aid in the diagnosis of influenza from Nasopharyngeal swab specimens and should not be used as a sole basis for treatment. Nasal washings and aspirates are unacceptable for Xpert Xpress SARS-CoV-2/FLU/RSV testing.  Fact Sheet for Patients: BloggerCourse.com  Fact Sheet for Healthcare Providers: SeriousBroker.it  This test is not yet approved or cleared by the [place] FDA and has been authorized for detection and/or diagnosis of SARS-CoV-2 by FDA under an Emergency Use Authorization (EUA). This EUA will remain in effect (meaning this test can be used) for the  duration of the COVID-19 declaration under Section 564(b)(1) of the Act, 21 U.S.C. section 360bbb-3(b)(1), unless the authorization is terminated or revoked.     Resp Syncytial Virus by PCR NEGATIVE NEGATIVE Final    Comment: (NOTE) Fact Sheet for Patients: BloggerCourse.com  Fact Sheet for Healthcare Providers: SeriousBroker.it  This test is not yet approved or cleared by the [place] FDA and has been authorized for detection and/or diagnosis of SARS-CoV-2 by FDA under an Emergency Use Authorization (EUA). This EUA will remain in effect (meaning this test can be used) for the duration of the COVID-19 declaration under Section 564(b)(1) of the Act, 21 U.S.C. section 360bbb-3(b)(1), unless the authorization is terminated or revoked.  Performed at [facility], [address], [place], [place] [address]   MRSA Next Gen by PCR, Nasal     Status: None   Collection Time: 10/27/23  9:27 PM   Specimen: Nasal Mucosa; Nasal Swab  Result Value Ref Range Status   MRSA by PCR Next Gen NOT DETECTED NOT DETECTED Final    Comment: (NOTE) The GeneXpert MRSA Assay (FDA approved for NASAL specimens only), is one component of a comprehensive MRSA colonization surveillance program. It is not intended to diagnose MRSA infection nor to guide or monitor treatment for MRSA infections. Test performance is not FDA approved in patients less than 42 years old. Performed at [facility], [address], [place], [place] [address]     Anti-infectives:  Anti-infectives (From admission, onward)    None      Consults: Treatment Team:  Tressie Stalker, MD    Studies:    Events:  Subjective:    Overnight Issues: BiPAP, precedex  Objective:  Vital signs for last 24 hours: Temp:  [97.5 F (36.4 C)-99.4 F (37.4 C)] 98.4 F (36.9 C) (11/13 0400) Pulse Rate:  [69-128] 101 (11/13 0455) Resp:  [20-33] 22 (11/13 0300) BP:  (129-198)/(59-126) 177/72 (11/13 0455) SpO2:  [90 %-100 %] 100 % (11/13 0400) FiO2 (%):  [40 %] 40 % (11/13 0809)  Hemodynamic parameters for last 24 hours:    Intake/Output from previous day: 11/12 0701 - 11/13 0700 In: 1868.4 [I.V.:1568.4; IV Piggyback:300] Out: 515 [Urine:515]  Intake/Output this shift: No intake/output data recorded.  Vent settings for last 24 hours: Vent Mode: PCV;BIPAP FiO2 (%):  [40 %] 40 % Set Rate:  [20 bmp] 20 bmp PEEP:  [5 cmH20] 5 cmH20 Pressure Support:  [17 cmH20] 17 cmH20  Physical Exam:  General: on BiPAP Neuro: gets quite agitated to stim HEENT/Neck: BiPAP mask Resp: clear to auscultation bilaterally CVS: RRR GI: soft, NT Extremities: mile edema  Results for orders placed or performed during the hospital encounter of 10/27/23 (from the past 24 hour(s))  Comprehensive metabolic panel     Status: Abnormal   Collection Time: 11/01/23  1:06 PM  Result Value Ref Range   Sodium 139 135 - 145 mmol/L   Potassium 4.1 3.5 - 5.1 mmol/L   Chloride 97 (L) 98 - 111 mmol/L   CO2 32 22 - 32 mmol/L   Glucose, Bld 120 (H) 70 - 99 mg/dL   BUN 17 8 - 23 mg/dL   Creatinine, Ser 4.78 0.44 - 1.00 mg/dL   Calcium 8.8 (L) 8.9 - 10.3 mg/dL   Total Protein 5.4 (L) 6.5 - 8.1 g/dL   Albumin 2.1 (L) 3.5 - 5.0 g/dL   AST 18 15 - 41 U/L   ALT 13 0 - 44 U/L   Alkaline Phosphatase 84 38 - 126 U/L   Total Bilirubin 0.9 <1.2 mg/dL   GFR, Estimated >29 >56 mL/min   Anion gap 10 5 - 15  Ammonia     Status: Abnormal   Collection Time: 11/01/23  1:06 PM  Result Value Ref Range   Ammonia 66 (H) 9 - 35 umol/L  CBC     Status: Abnormal   Collection Time: 11/02/23  5:44 AM  Result Value Ref Range   WBC 5.8 4.0 - 10.5 K/uL   RBC 3.58 (L) 3.87 - 5.11 MIL/uL   Hemoglobin 11.9 (L) 12.0 - 15.0 g/dL   HCT 21.3 08.6 - 57.8 %   MCV 104.5 (H) 80.0 - 100.0 fL   MCH 33.2 26.0 - 34.0 pg   MCHC 31.8 30.0 - 36.0 g/dL   RDW 46.9 62.9 - 52.8 %   Platelets 188 150 - 400 K/uL    nRBC 0.0 0.0 - 0.2 %  Basic metabolic panel     Status: Abnormal   Collection Time: 11/02/23  5:44 AM  Result Value Ref Range   Sodium 140 135 - 145 mmol/L   Potassium 3.6 3.5 - 5.1 mmol/L   Chloride 101 98 - 111 mmol/L   CO2 31 22 - 32 mmol/L   Glucose, Bld 134 (H) 70 - 99 mg/dL   BUN 10 8 - 23 mg/dL   Creatinine, Ser 4.13 0.44 - 1.00 mg/dL   Calcium 8.8 (L) 8.9 - 10.3 mg/dL   GFR, Estimated >24 >40 mL/min   Anion gap 8 5 - 15    Assessment & Plan: Present on Admission:  Closed unstable burst  fracture of T5 vertebra (HCC)    LOS: 6 days   Additional comments:I reviewed the patient's new clinical lab test results. / 55F s/p fall   SAH, IVH - NSGY c/s, Dr. Jake Samples, repeat head CT reviewed, keppra x7d, SLP c/s AF on eliquis - rec'd Andexxa, hold for now Subacute T5 burst fx - MRI completed, Being managed pre-admission by Dr. Lovell Sheehan. Brace at bedside. Okay to ambulate, monitor for neuro sx after ambulation. ETOH withdrawl - precedex, psych c/s with med adjustment - cymbalta 60 BID, buspirone 15 TID, quetiapine 25 at bedtime, trazodone 100 at bedtime, high dose thiamine, CIWA R 6th rib - pain control, pulm toilet  FEN- ST re-eval rec NPO, place Cortrak and start TF, changed meds to per tube VTE- LMWH ID- no issues Dispo- ICU, therapies, Palliative Care is also following. Appreciate their help. Critical Care Total Time*: 34 Minutes  Isabel Gelinas, MD, MPH, FACS Trauma & General Surgery Use [email or web address] to contact on call provider  11/02/2023  *Care during the described time interval was provided by me. I have reviewed this patient's available data, including medical history, events of note, physical examination and test results as part of my evaluation.

## 2023-11-02 NOTE — Progress Notes (Signed)
RT called to bedside by RN due to pt in resp distress and desat. RT X2 arrived and bedside pt SpO2 86% while on BiPAP. RT increased FiO2 to 100% on BiPAP and pt BS where very diminished with no air movement, with stridor in upper airway. BiPAP showed min ventilation of 1.8 and VT of 182. RT gave 10mg  CAT and one dose of racemic epi. Pt BS were still diminished however there was better air movement SPO2 increased to 93%. Pt min ventilation increased to 7.2 and VT to 425 on BiPAP. MD at bedside, MD aware, RN at bedside, RT will monitor as needed.      11/02/23 1502  Therapy Vitals  Pulse Rate 88  Resp (!) 24  MEWS Score/Color  MEWS Score 4  MEWS Score Color Red  Respiratory Assessment  Assessment Type Assess only  Respiratory Pattern Labored;Tachypnea  Chest Assessment Chest expansion symmetrical  Cough None  Bilateral Breath Sounds (S)  Diminished  Oxygen Therapy/Pulse Ox  O2 Device Bi-PAP  O2 Therapy Oxygen  FiO2 (%) (S)  100 % (due to pt desat)  SpO2 93 %

## 2023-11-02 NOTE — Progress Notes (Addendum)
Initial Nutrition Assessment  DOCUMENTATION CODES:   Obesity unspecified  INTERVENTION:   Initiate tube feeding via post pyloric Cortrak tube: Pivot 1.5 at 30 ml/h and increase by 10 ml every 8 hours to goal rate of 55 ml/h (1320 ml per day)  Provides 1980 kcal, 123 gm protein, 1003 ml free water daily  Monitor magnesium and phosphorus every 12 hours x 4 occurrences, MD to replete as needed, as pt is at risk for refeeding syndrome given hx of ETOH abuse.   Continue folic acid, thiamine, and MVI with minerals  NUTRITION DIAGNOSIS:   Increased nutrient needs related to  (trauma) as evidenced by estimated needs.  GOAL:   Patient will meet greater than or equal to 90% of their needs  MONITOR:   TF tolerance  REASON FOR ASSESSMENT:   Consult Enteral/tube feeding initiation and management  ASSESSMENT:   Pt with PMH of ETOH abuse, AF on eliquis admitted after a fall with SAH, IVH, subacute T5 burst fx, and R 6th rib fx.   Pt discussed during ICU rounds and with RN and MD.  Pt currently working with therapy, sitting at bedside with staff without BiPAP. No family present.  Pt off BiPAP for cortrak placement.   11/13 - s/p cortrak placement; tip in distal duodenum   Medications reviewed and include: folic acid, levothyroxine, MVI with minerals, phenobarbital, miralax, senna, thiamine 500 mg TIDx 3 days then 100 mg daily Precedex  Labs reviewed:  K 3.6 PO4 3.2 Mg 1.8 Folate 17.5 Vitamin B 12 916 CBGs: 132  NUTRITION - FOCUSED PHYSICAL EXAM:  Unable to complete at this time   Diet Order:   Diet Order             Diet NPO time specified Except for: Ice Chips  Diet effective now                   EDUCATION NEEDS:   Not appropriate for education at this time  Skin:  Skin Assessment: Reviewed RN Assessment  Last BM:  unknown  Height:   Ht Readings from Last 1 Encounters:  10/27/23 5\' 7"  (1.702 m)    Weight:   Wt Readings from Last 1 Encounters:   11/01/23 106.2 kg    Ideal Body Weight:   61.3 kg   BMI:  Body mass index is 36.67 kg/m.  Estimated Nutritional Needs:   Kcal:  1900-2100  Protein:  105 - 125 grams  Fluid:  >1.9 L/day  Cammy Copa., RD, LDN, CNSC See AMiON for contact information

## 2023-11-02 NOTE — Progress Notes (Signed)
RT, RN, and TPN took pt to CT and back w/o complications via Bipap (NIV).

## 2023-11-02 NOTE — Progress Notes (Signed)
I am covering trauma this afternoon and was notified by nursing of desaturation. By report, she has been quite sedated today following multiple medications given this morning to combat agitation and suspected EtOH withdrawal. She has been on BiPAP today. When I arrived saturations had improved from 85% to 97% following nebulization of albuterol and racemic epi. She will arouse to painful stimuli.  Checking CXR, abg, repeat head CT  Concerns noted for code status. I called and spoke with her husband Onida Houghton at length as well as ultimately her daughter, Wendee Copp. With both, I reviewed her code status and stated goals of care. Previously notation made for possible 'do not intubate' and no cpr/shocks if cardiac arrest occurred. They both feel that she would wish to be intubated if we felt this was temporary and not something that would be necessary for weeks to come. They also believe she would not wish to have chest compressions/shock administered if her heart were to stop. All of there questions were answered, expressed understanding and agreement with the plan   - Narcan given - D/C precidex, d/c phenobarbital; have asked nursing not to administer any ativan or oxycodone at present and allow current medications to wear. Suspect over the coming hours, agitation will return and may require precidex  - Code status updated to reflect current wishes   CRITICAL CARE Performed by: Andria Meuse   Total critical care time: 60 minutes  Critical care time was exclusive of separately billable procedures and treating other patients.  Critical care was necessary to treat or prevent imminent or life-threatening deterioration.  Critical care was time spent personally by me on the following activities: development of treatment plan with patient and/or surrogate as well as nursing, discussions with consultants, evaluation of patient's response to treatment, examination of patient, obtaining  history from patient or surrogate, ordering and performing treatments and interventions, ordering and review of laboratory studies, ordering and review of radiographic studies, pulse oximetry and re-evaluation of patient's condition.  Marin Olp, MD [facility]

## 2023-11-03 DIAGNOSIS — S22052S Unstable burst fracture of T5-T6 vertebra, sequela: Secondary | ICD-10-CM | POA: Diagnosis not present

## 2023-11-03 DIAGNOSIS — F10931 Alcohol use, unspecified with withdrawal delirium: Secondary | ICD-10-CM

## 2023-11-03 LAB — CBC
HCT: 34.4 % — ABNORMAL LOW (ref 36.0–46.0)
Hemoglobin: 10.8 g/dL — ABNORMAL LOW (ref 12.0–15.0)
MCH: 33.8 pg (ref 26.0–34.0)
MCHC: 31.4 g/dL (ref 30.0–36.0)
MCV: 107.5 fL — ABNORMAL HIGH (ref 80.0–100.0)
Platelets: 203 10*3/uL (ref 150–400)
RBC: 3.2 MIL/uL — ABNORMAL LOW (ref 3.87–5.11)
RDW: 13.1 % (ref 11.5–15.5)
WBC: 7 10*3/uL (ref 4.0–10.5)
nRBC: 0 % (ref 0.0–0.2)

## 2023-11-03 LAB — BASIC METABOLIC PANEL
Anion gap: 7 (ref 5–15)
BUN: 22 mg/dL (ref 8–23)
CO2: 29 mmol/L (ref 22–32)
Calcium: 8.4 mg/dL — ABNORMAL LOW (ref 8.9–10.3)
Chloride: 104 mmol/L (ref 98–111)
Creatinine, Ser: 0.63 mg/dL (ref 0.44–1.00)
GFR, Estimated: >60 mL/min (ref >60)
Glucose, Bld: 131 mg/dL — ABNORMAL HIGH (ref 70–99)
Potassium: 3.8 mmol/L (ref 3.5–5.1)
Sodium: 140 mmol/L (ref 135–145)

## 2023-11-03 LAB — GLUCOSE, CAPILLARY
Glucose-Capillary: 113 mg/dL — ABNORMAL HIGH (ref 70–99)
Glucose-Capillary: 123 mg/dL — ABNORMAL HIGH (ref 70–99)
Glucose-Capillary: 124 mg/dL — ABNORMAL HIGH (ref 70–99)
Glucose-Capillary: 145 mg/dL — ABNORMAL HIGH (ref 70–99)
Glucose-Capillary: 156 mg/dL — ABNORMAL HIGH (ref 70–99)
Glucose-Capillary: 163 mg/dL — ABNORMAL HIGH (ref 70–99)

## 2023-11-03 LAB — MAGNESIUM
Magnesium: 1.8 mg/dL (ref 1.7–2.4)
Magnesium: 1.7 mg/dL (ref 1.7–2.4)

## 2023-11-03 LAB — PHOSPHORUS
Phosphorus: 3.8 mg/dL (ref 2.5–4.6)
Phosphorus: 3 mg/dL (ref 2.5–4.6)

## 2023-11-03 MED ORDER — PHENOBARBITAL 32.4 MG PO TABS
32.4000 mg | ORAL_TABLET | Freq: Three times a day (TID) | ORAL | Status: AC
  Administered 2023-11-03 – 2023-11-05 (×5): 32.4 mg
  Filled 2023-11-03 (×6): qty 1

## 2023-11-03 MED ORDER — HALOPERIDOL LACTATE 2 MG/ML PO CONC
2.0000 mg | Freq: Three times a day (TID) | ORAL | Status: DC
  Administered 2023-11-03 – 2023-11-04 (×3): 2 mg
  Filled 2023-11-03 (×3): qty 5

## 2023-11-03 MED ORDER — FLUOXETINE HCL 20 MG PO CAPS: 20.0000 mg | ORAL_CAPSULE | Freq: Every day | ORAL | Status: DC

## 2023-11-03 MED ORDER — FUROSEMIDE 10 MG/ML IJ SOLN
40.0000 mg | Freq: Once | INTRAMUSCULAR | Status: AC
  Administered 2023-11-03: 40 mg via INTRAVENOUS
  Filled 2023-11-03: qty 4

## 2023-11-03 MED ORDER — HYDRALAZINE HCL 50 MG PO TABS
50.0000 mg | ORAL_TABLET | Freq: Three times a day (TID) | ORAL | Status: DC
  Administered 2023-11-03 – 2023-11-06 (×8): 50 mg
  Filled 2023-11-03 (×8): qty 1

## 2023-11-03 MED ORDER — HALOPERIDOL LACTATE 2 MG/ML PO CONC: 2.0000 mg | Freq: Three times a day (TID) | ORAL | Status: DC

## 2023-11-03 MED ORDER — FLUOXETINE HCL 20 MG PO CAPS
20.0000 mg | ORAL_CAPSULE | Freq: Every day | ORAL | Status: DC
  Administered 2023-11-03 – 2023-11-10 (×8): 20 mg
  Filled 2023-11-03 (×8): qty 1

## 2023-11-03 NOTE — Progress Notes (Signed)
Subjective: The patient remains sedated and confused/agitated.  Objective: Vital signs in last 24 hours: Temp:  [97.1 F (36.2 C)-99 F (37.2 C)] 98.4 F (36.9 C) (11/14 0800) Pulse Rate:  [59-98] 90 (11/14 0800) Resp:  [12-32] 16 (11/14 0800) BP: (88-196)/(43-153) 166/73 (11/14 0800) SpO2:  [88 %-100 %] 100 % (11/14 0800) FiO2 (%):  [40 %-100 %] 50 % (11/14 0735) Estimated body mass index is 36.67 kg/m as calculated from the following:   Height as of this encounter: 5\' 7"  (1.702 m).   Weight as of this encounter: 106.2 kg.   Intake/Output from previous day: 11/13 0701 - 11/14 0700 In: 1780.8 [I.V.:585.4; NG/GT:795; IV Piggyback:400.4] Out: 325 [Urine:325] Intake/Output this shift: No intake/output data recorded.  Physical exam the patient is somnolent but easily arousable.  She is purposeful and moves all 4 extremities.  Lab Results: Recent Labs    11/02/23 0544 11/02/23 1552  WBC 5.8  --   HGB 11.9* 11.2*  HCT 37.4 33.0*  PLT 188  --    BMET Recent Labs    11/01/23 1306 11/02/23 0544 11/02/23 1552  NA 139 140 140  K 4.1 3.6 3.1*  CL 97* 101  --   CO2 32 31  --   GLUCOSE 120* 134*  --   BUN 17 10  --   CREATININE 0.48 0.69  --   CALCIUM 8.8* 8.8*  --     Studies/Results: CT HEAD WO CONTRAST ( )  Result Date: 11/02/2023 CLINICAL DATA:  Head trauma, moderate-severe EXAM: CT HEAD WITHOUT CONTRAST TECHNIQUE: Contiguous axial images were obtained from the base of the skull through the vertex without intravenous contrast. RADIATION DOSE REDUCTION: This exam was performed according to the departmental dose-optimization program which includes automated exposure control, adjustment of the mA and/or kV according to patient size and/or use of iterative reconstruction technique. COMPARISON:  CT head October 28, 2023. FINDINGS: Motion limited study.  Within this limitation: Brain: Decreased small volume subarachnoid hemorrhage which is visible long the anterior left  temporal convexity (series 4, image 49; series 2, image 16). No evidence of new hemorrhage, acute large vascular territory infarct, midline shift or hydrocephalus. Vascular: No hyperdense vessel. Skull: No acute fracture. Sinuses/Orbits: Clear sinuses.  No acute orbital findings. IMPRESSION: Decreased small volume of subarachnoid hemorrhage. No evidence of new/interval acute abnormality. Electronically Signed   By: Feliberto Harts M.D.   On: 11/02/2023 21:47   DG CHEST PORT 1 VIEW  Result Date: 11/02/2023 CLINICAL DATA:  Oxygen desaturation. EXAM: PORTABLE CHEST 1 VIEW COMPARISON:  October 30, 2023. FINDINGS: Stable cardiomegaly. Feeding tube is seen entering stomach. Status post left shoulder arthroplasty. Bibasilar opacities are noted concerning for atelectasis or infiltrates with probable associated pleural effusions. IMPRESSION: Bibasilar opacities concerning for atelectasis or infiltrates with possible pleural effusions. Electronically Signed   By: Lupita Raider M.D.   On: 11/02/2023 17:30   DG Abd Portable 1V  Result Date: 11/02/2023 CLINICAL DATA:  Feeding tube placement. EXAM: PORTABLE ABDOMEN - 1 VIEW COMPARISON:  CT 10/27/2023 FINDINGS: The weighted enteric tube courses into the right abdomen, tip in the left mid abdomen in the region of the distal duodenum. No bowel dilatation to suggest obstruction. IMPRESSION: Weighted enteric tube tip in the region of the distal duodenum. Electronically Signed   By: Narda Rutherford M.D.   On: 11/02/2023 10:13    Assessment/Plan: T5 fracture, thoracic stenosis: Hopefully this will heal in a brace as surgery for this would be risky with  a significant chance of failure.  Alcohol withdrawal: Noted  LOS: 7 days     Cristi Loron 11/03/2023, 8:17 AM

## 2023-11-03 NOTE — Consult Note (Signed)
[name] Psychiatry Consult Evaluation  Service Date: November 03, 2023 LOS:  LOS: 7 days    Primary Psychiatric Diagnoses  EtOH w/d delirium  2.  GAD, severe 3.  High risk for Wernicke/Korsakoff   Assessment  [name] is a 72 y.o. female admitted medically for 10/27/2023  3:31 PM after a fall. She carries the psychiatric diagnoses of anxiety, depression, and EtOH use d/o and has a past medical history of  afib, breast cancer, chronic pain, hypothyroidism, spinal stenosis, vit D deficiency. Psychiatry was consulted for anxiety, coping  by Dr. Bedelia [name].  In a prior hospitalization - at that point pt went into severe EtOH w/d, began confabulating, and ultimately required IV thiamine. Discussed this with Dr. Bedelia [name] shortly after consult placed.   At initial consult, the patient's presentation is most consistent with  delirium, most likely due to multiple etiologies including but not limited to alcohol withdrawal infection, medications, pain, altered sleep/wake cycle, and limited mobility. The patient would strongly benefit from medical treatment of alcohol withdrawal, as well as further investigation for etiologies of delirium. Full psychiatric assessment was not able to be completed as pt was not able to engage in a coherent interview - by history (and multiple family member's accounts) anxiety has driven her drinking behavior. Debated phenobarb vs ativan for EtOH w/d (having sx despite being on alpha agonist which can help w/ milder cases) - ultimately went with ativan d/t older age, medical frailty, and most importantly shorter half life (easier to stop/hold if it worsens delirium).  Plan d/w daughter and husband. Pt unable to engage in discussion of r/b/se.  During this time period, minimization of deliriogenic insults will be of utmost importance; this includes promoting the normal circadian cycle, minimizing lines/tubes, avoiding deliriogenic medications such as benzodiazepines and  anticholinergic medications, and frequently reorienting the patient. Symptomatic treatment for agitation can be provided by antipsychotic medications, though it is important to remember that these do not treat the underlying etiology of delirium. Notably, there can be a time lag effect between treatment of a medical problem and resolution of delirium. This time lag effect may be of longer duration in the elderly, and those with underlying cognitive impairment or brain injury.  11/03/2023 Patient seen this morning.  She is in bed wearing BiPAP.  She does not open her eyes to verbal command or touch.  Her daughter, [name] and husband, [name] are at bedside.  They state that patient has not been communicating with them, but appears restless and agitated.  Husband states that in going through the house, he found 35 small bottles of wine.  He does not know the time frame in which patient may have been drinking these, but believes that she increased her drinking approximately 8 weeks ago following a back injury.  Husband reports that patient uses alcohol and pain meds to "self-medicate".  He notes that he has not been sharing a bedroom with his wife over the past year due to her noisy breathing and grunting sounds at night.  He states that she was never diagnosed with an obstructive sleep apnea. In reviewing medications, husband recalls that patient previously did well with Prozac when she was first diagnosed with depression.  He believes that buspirone has been helpful for her anxiety.  He has not noted any significant difference with Cymbalta or Zoloft in treating her mood disorder.  Nursing is at bedside and note that patient becomes extremely agitated every 4 hours as her Ativan wears off.  She will pull at her lines and become very restless in the bed.   Diagnoses:  Active Hospital problems: Principal Problem:   Closed unstable burst fracture of T5 vertebra (HCC)  Alcohol withdrawal   Plan   ##  Psychiatric Medication Recommendations:  -- STOP sertraline 50 mg -- Cymbalta 60 BID, quetiapine 25 at bedtime, had been discontinued prior to this encounter -- CONTINUE buspirone 15 TID - home regimen  -- Continue trazodone 50 mg at bedtime (decreased by primary team) -- Start fluoxetine 20 mg daily via tube for depression -- Start Haldol 2 mg per tube 3 times daily to decrease agitation, and assess for decreased need of benzodiazepines -- Continue high dose IV thiamine protocol (500 TID x 9 doses, then oral) -- Continue CIWA + sx triggered ativan - likely start taper when we get a good idea of daily requirements    Good gabapentin candidate- this has not been started  On belbuca 450 at home, getting full agonist opioids currently.   ## Medical Decision Making Capacity:  Clearly lacked for most major decisions.  Husband and daughter are at bedside as surrogate decision makers  ## Further Work-up:  -- B1, folate, B12-acceptable range -- Pertinent labwork reviewed earlier this admission includes: MCV 108  -- most recent EKG on 11/07 had QtC of 479 -- Repeat ECG 11/04/2023  ## Disposition:  -- There are no psychiatric contraindications to discharge at this time  ## Behavioral / Environmental:  --  Delirium Precautions: Delirium Interventions for Nursing and Staff: - RN to open blinds every AM. - To Bedside: Glasses, hearing aide, and pt's own shoes. Make available to patients. when possible and encourage use. - Encourage po fluids when appropriate, keep fluids within reach. - OOB to chair with meals. - Passive ROM exercises to all extremities with AM & PM care. - RN to assess orientation to [name], time and place QAM and PRN. - Recommend extended visitation hours with familiar family/friends as feasible. - Staff to minimize disturbances at night. Turn off television when pt asleep or when not in use.     ## Safety and Observation Level:  - Based on my clinical evaluation, I  estimate the patient to be at low risk of self harm in the current setting mostly due to decreased safety awareness   - At this time, we recommend a routine level of observation. This decision is based on my review of the chart including patient's history and current presentation, interview of the patient, mental status examination, and consideration of suicide risk including evaluating suicidal ideation, plan, intent, suicidal or self-harm behaviors, risk factors, and protective factors. This judgment is based on our ability to directly address suicide risk, implement suicide prevention strategies and develop a safety plan while the patient is in the clinical setting. Please contact our team if there is a concern that risk level has changed.  Suicide risk assessment  Patient has following modifiable risk factors for suicide: severe anxiety, EtOH use d/o which we are addressing by addressing meds as appropriate, providing outpt resources closer to dc.   Patient has following non-modifiable or demographic risk factors for suicide: N/A  Patient has the following protective factors against suicide: Supportive family and no history of suicide attempts   Thank you for this consult request. Recommendations have been communicated to the primary team.  We will continue to follow at this time.   Mariel Craft, MD  Psychiatric and Social History   Relevant Aspects of  Hospital Course:  Admitted on 10/27/2023 for a T5 fracture following a fall. They have been progressively more anxious/confused since. Notably, at time of this note was operating under assumption pt did not want daughter informed of EtOH use.   Patient Report:  Pt seen in AM.  Agitated and restless in the bed, wearing BiPAP and making grunting sounds.  Medications reviewed with husband who gives consent for medication changes.   10/31/2023 Spoke to daughter - pt has been confused, anxious, difficult to redirect. Discussed pt's transient  SI statement - will notify staff. Doid not discuss EtOH use (at the time, was under impression daughter was not to be informed about EtOH use) Called husband - now OK to loop daughter into EtOH use. Talked through pt's long history of anxiety (through life) and EtOH use (which emerged later in life). Started drinking more when daughter went to college. Anxiety got worse w/ health issues, started self medicating with wine. Thinks most recent falls d/t EtOH but unsure. Never went to EtOH rehab, did benefit from AA - was sober for a year up until around Jan of this  year. Had large increase in use over past 4 weeks - husband not sure how much but many wine bottles found in room. He is guessing about 1 bottle of wine a day on top of pain pills, muscle relaxers. Was eating very little.    Psych ROS:  Pt unable to participate in meaningful ROS d/t AMS.   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, Gaffer, etc Occupational Hx: unknown Living Situation: With husband Access to weapons: Deferred  Substance History Tobacco use: no Alcohol use: see HPI Drug use: no   Exam Findings   Psychiatric Specialty Exam:  Presentation  General Appearance: Disheveled  Eye Contact:None  Speech:-- (grunting)  Speech Volume:Normal  Handedness:Right   Mood and Affect  Mood:-- (UTA)  Affect:-- (agitated)   Thought Process  Thought Processes:-- (UTA)  Descriptions of  Associations:-- (UTA)  Orientation:-- (UTA)  Thought Content:-- (UTA)  Hallucinations:Hallucinations: -- (UTA)   Ideas of Reference:-- (UTA)  Suicidal Thoughts:Suicidal Thoughts: -- (UTA)   Homicidal Thoughts:Homicidal Thoughts: -- (UTA)    Sensorium  Memory:-- (UTA)  Judgment:-- (UTA)  Insight:-- (UTA)   Executive Functions  Concentration:-- (UTA)  Attention Span:-- (UTA)  Recall:-- (UTA)  Fund of Knowledge:-- (UTA)  Language:-- (UTA)   Psychomotor Activity  Psychomotor Activity:Psychomotor Activity: Restlessness    Assets  Assets:Social Support; Housing   Sleep  Sleep:Sleep: -- (UTA)     Physical Exam: Vital signs:  Temp:  [97.1 F (36.2 C)-99 F (37.2 C)] 98.2 F (36.8 C) (11/14 1600) Pulse Rate:  [67-98] 76 (11/14 1800) Resp:  [16-24] 21 (11/14 1800) BP: (105-196)/(44-94) 123/44 (11/14 1800) SpO2:  [90 %-100 %] 93 % (11/14 1800) FiO2 (%):  [40 %-60 %] 40 % (11/14 1930) Physical Exam Constitutional:      Appearance: She is obese.  HENT:     Head: Normocephalic.  Pulmonary:     Comments:  Wearing BiPAP  Musculoskeletal:     Comments: Able to move upper and lower extremities.  Wearing mitts to avoid pulling at tubes.  Neurological:     Mental Status: She is alert. She is disoriented.     Blood pressure (!) 123/44, pulse 76, temperature 98.2 F (36.8 C), temperature source Axillary, resp. rate (!) 21, height 5\' 7"  (1.702 m), weight 106.2 kg, SpO2 93%. Body mass index is 36.67 kg/m.   Other History   These have been pulled in through the EMR, reviewed, and updated if appropriate.   Family History:  The patient's family history includes Alcoholism in her father; Anxiety disorder in her mother; Dementia in her mother; Depression in her mother; Diabetes in her father and sister; Hypertension in her sister; Stroke in her father; Thyroid disease in her mother.  Medical History: Past Medical History:  Diagnosis Date   Alcohol abuse     Anxiety    Atrial fibrillation [facility])    sees Dr. Flora Lipps   Bilateral swelling of feet    Cancer [facility])    breast   Chronic diarrhea    Chronic pain    Concussion 02/2007   ICU x 3 days   Depression    Hypertension    Hypothyroidism    Personal history of chemotherapy    Personal history of radiation therapy    Spinal stenosis    Thyroid disease ?1994   Vitamin D deficiency     Surgical History: Past Surgical History:  Procedure Laterality Date   BREAST BIOPSY Right 12/20/2019   x2   BREAST LUMPECTOMY Right 01/22/2020   BREAST LUMPECTOMY WITH RADIOACTIVE SEED AND SENTINEL LYMPH NODE BIOPSY Right 01/22/2020   Procedure: RIGHT BREAST LUMPECTOMY WITH RADIOACTIVE SEED X2 AND RIGHT SENTINEL LYMPH NODE MAPPING;  Surgeon: Harriette Bouillon, MD;  Location: [facility];  Service: General;  Laterality: Right;   BREAST SURGERY Right 03/1998   breast biopsy, benign   EYE SURGERY Right    PORT-A-CATH REMOVAL Right 04/30/2021   Procedure: REMOVAL PORT-A-CATH;  Surgeon: Harriette Bouillon, MD;  Location: [facility];  Service: General;  Laterality: Right;   PORTACATH PLACEMENT Right 01/22/2020   Procedure: INSERTION PORT-A-CATH WITH ULTRASOUND;  Surgeon: Harriette Bouillon, MD;  Location: [facility];  Service: General;  Laterality: Right;   PORTACATH PLACEMENT Right 02/28/2020   Procedure: PORT A CATH REVISION;  Surgeon: Harriette Bouillon, MD;  Location: [facility] OR;  Service: General;  Laterality: Right;   REVERSE SHOULDER ARTHROPLASTY Left 02/02/2021   Procedure: LEFT REVERSE SHOULDER ARTHROPLASTY;  Surgeon: Cammy Copa, MD;  Location: [facility] OR;  Service: Orthopedics;  Laterality: Left;   SHOULDER SURGERY Right    SPINAL FUSION  03/04/2011   with ORIF    Medications:   Current Facility-Administered Medications:    acetaminophen (TYLENOL) tablet 1,000 mg, 1,000 mg, Per Tube, Q6H, [name], MD, 1,000 mg at 11/03/23 1633   busPIRone (BUSPAR)  tablet 15 mg, 15 mg, Per Tube, TID, [name], MD, 15 mg at 11/03/23 1523   carvedilol (COREG) tablet 25 mg, 25 mg, Per Tube, BID WC, [name], MD, 25 mg at 11/03/23 1633   Chlorhexidine Gluconate Cloth 2 % PADS 6 each, 6 each, Topical, Daily, Kinsinger, De Blanch, MD, 6 each at 11/03/23 1527   enoxaparin (LOVENOX) injection 40 mg, 40 mg, Subcutaneous, Q12H, Lovick, Lennie Odor, MD, 40 mg at 11/03/23 1134   feeding supplement (PIVOT 1.5 CAL) liquid 1,000 mL, 1,000 mL, Per  Tube, Continuous, [name], MD, Last Rate: 55 mL/hr at 11/03/23 1800, Infusion Verify at 11/03/23 1800   FLUoxetine (PROZAC) capsule 20 mg, 20 mg, Per Tube, Daily, Mariel Craft, MD, 20 mg at 11/03/23 1307   folic acid (FOLVITE) tablet 1 mg, 1 mg, Per Tube, Daily, [name], MD, 1 mg at 11/03/23 1610   haloperidol (HALDOL) 2 MG/ML solution 2 mg, 2 mg, Per Tube, TID, Mariel Craft, MD, 2 mg at 11/03/23 1307   haloperidol lactate (HALDOL) injection 10 mg, 10 mg, Intravenous, Q6H PRN, [name], MD, 10 mg at 11/02/23 0959   hydrALAZINE (APRESOLINE) injection 10 mg, 10 mg, Intravenous, Q4H PRN, [name], MD, 10 mg at 11/03/23 1501   hydrALAZINE (APRESOLINE) tablet 50 mg, 50 mg, Per Tube, Q8H, [name], MD, 50 mg at 11/03/23 1307   ketorolac (TORADOL) 15 MG/ML injection 15 mg, 15 mg, Intravenous, Q6H, Lovick, Lennie Odor, MD, 15 mg at 11/03/23 1633   levothyroxine (SYNTHROID) tablet 150 mcg, 150 mcg, Per Tube, Q0600, [name], MD, 150 mcg at 11/03/23 0503   LORazepam (ATIVAN) injection 1 mg, 1 mg, Intravenous, Q4H PRN, [name], MD, 1 mg at 11/03/23 1747   losartan (COZAAR) tablet 100 mg, 100 mg, Per Tube, Daily, [name], MD, 100 mg at 11/03/23 9604   methocarbamol (ROBAXIN) injection 1,000 mg, 1,000 mg, Intravenous, Q8H, [name], MD, 1,000 mg at 11/03/23 1438   morphine (PF) 4 MG/ML injection 4 mg, 4 mg, Intravenous, Q2H PRN, [name], MD, 4 mg at  11/02/23 5409   multivitamin with minerals tablet 1 tablet, 1 tablet, Per Tube, Daily, [name], MD, 1 tablet at 11/03/23 8119   naloxone [facility]) injection 0.4 mg, 0.4 mg, Intravenous, PRN, Andria Meuse, MD, 0.4 mg at 11/02/23 1505   ondansetron (ZOFRAN-ODT) disintegrating tablet 4 mg, 4 mg, Oral, Q6H PRN **OR** ondansetron (ZOFRAN) injection 4 mg, 4 mg, Intravenous, Q6H PRN, Kinsinger, De Blanch, MD, 4 mg at 10/29/23 1205   Oral care mouth rinse, 15 mL, Mouth Rinse, PRN, Kinsinger, De Blanch, MD   Oral care mouth rinse, 15 mL, Mouth Rinse, 4 times per day, Stechschulte, Hyman Hopes, MD, 15 mL at 11/03/23 1601   oxyCODONE (Oxy IR/ROXICODONE) immediate release tablet 5-10 mg, 5-10 mg, Per Tube, Q4H PRN, [name], MD, 10 mg at 11/02/23 2102   PHENobarbital (LUMINAL) tablet 32.4 mg, 32.4 mg, Per Tube, Q8H, [name], MD, 32.4 mg at 11/03/23 1633   polyethylene glycol (MIRALAX / GLYCOLAX) packet 17 g, 17 g, Per Tube, Daily PRN, [name], MD   polyethylene glycol (MIRALAX / GLYCOLAX) packet 17 g, 17 g, Per Tube, Daily, [name], MD, 17 g at 11/02/23 1052   senna (SENOKOT) tablet 17.2 mg, 2 tablet, Per Tube, BID, [name], MD, 17.2 mg at 11/02/23 1052   sodium chloride (OCEAN) 0.65 % nasal spray 1 spray, 1 spray, Each Nare, PRN, [name], MD, 1 spray at 10/30/23 1156   [START ON 11/04/2023] thiamine (VITAMIN B1) 200 mg in sodium chloride 0.9 % 50 mL IVPB, 200 mg, Intravenous, Q24H, Starkes-Perry, Juel Burrow, FNP   traZODone (DESYREL) tablet 50 mg, 50 mg, Per Tube, QHS PRN, Maryagnes Amos, FNP, 50 mg at 11/02/23 2102  Allergies: Allergies  Allergen Reactions   Zolpidem Tartrate Other (See Comments)    Hallucinations and Confusion

## 2023-11-03 NOTE — Progress Notes (Signed)
  Progress Note   Date: 11/02/2023  Patient Name: EMAAN CHEONG        MRN#: [id number]  Review the patient's clinical findings supports the diagnosis of:   Acute respiratory acidosis

## 2023-11-03 NOTE — Progress Notes (Signed)
SLP Cancellation Note  Patient Details Name: [name] MRN: [id number] DOB: January 29, 1951   Cancelled treatment:       Reason Eval/Treat Not Completed: Medical issues which prohibited therapy. Pt currently on BiPAP and lethargic. SLP will continue following for readiness.    Gwynneth Aliment, M.A., CF-SLP Speech Language Pathology, Acute Rehabilitation Services  Secure Chat preferred [phone]  11/03/2023, 9:44 AM

## 2023-11-03 NOTE — Progress Notes (Signed)
  Progress Note   Date: 11/02/2023  Patient Name: [name]        MRN#: [id number]  Acute pulmonary edema due to fluid overload

## 2023-11-03 NOTE — Plan of Care (Signed)
  Problem: Clinical Measurements: Goal: Will remain free from infection Outcome: Progressing Goal: Diagnostic test results will improve Outcome: Progressing   Problem: Education: Goal: Knowledge of General Education information will improve Description: Including pain rating scale, medication(s)/side effects and non-pharmacologic comfort measures Outcome: Not Progressing   Problem: Health Behavior/Discharge Planning: Goal: Ability to manage health-related needs will improve Outcome: Not Progressing   Problem: Clinical Measurements: Goal: Ability to maintain clinical measurements within normal limits will improve Outcome: Not Progressing Goal: Respiratory complications will improve Outcome: Not Progressing Goal: Cardiovascular complication will be avoided Outcome: Not Progressing   Problem: Activity: Goal: Risk for activity intolerance will decrease Outcome: Not Progressing

## 2023-11-03 NOTE — Progress Notes (Signed)
Patient ID: [name], female   DOB: Nov 12, 1951, 72 y.o.   MRN: [id number] Follow up - Trauma Critical Care   Patient Details:    [name] is an 72 y.o. female.  Lines/tubes : Implanted Port 09/02/20 (Active)     Urethral Catheter A Duffy Rhody RN Non-latex 14 Fr. (Active)  Indication for Insertion or Continuance of Catheter Unstable critically ill patients first 24-48 hours (See Criteria) 11/03/23 0800  Site Assessment Clean, Dry, Intact 11/03/23 0800  Catheter Maintenance Bag below level of bladder;Catheter secured;Drainage bag/tubing not touching floor;Insertion date on drainage bag;No dependent loops;Seal intact;Bag emptied prior to transport 11/03/23 0800  Collection Container Standard drainage bag 11/03/23 0800  Securement Method Adhesive securement device 11/03/23 0800  Urinary Catheter Interventions (if applicable) Unclamped 11/02/23 2000  Output (mL) 225 mL 11/02/23 1800    Microbiology/Sepsis markers: Results for orders placed or performed during the hospital encounter of 10/27/23  Resp panel by RT-PCR (RSV, Flu A&B, Covid) Anterior Nasal Swab     Status: None   Collection Time: 10/27/23  4:12 PM   Specimen: Anterior Nasal Swab  Result Value Ref Range Status   SARS Coronavirus 2 by RT PCR NEGATIVE NEGATIVE Final   Influenza A by PCR NEGATIVE NEGATIVE Final   Influenza B by PCR NEGATIVE NEGATIVE Final    Comment: (NOTE) The Xpert Xpress SARS-CoV-2/FLU/RSV plus assay is intended as an aid in the diagnosis of influenza from Nasopharyngeal swab specimens and should not be used as a sole basis for treatment. Nasal washings and aspirates are unacceptable for Xpert Xpress SARS-CoV-2/FLU/RSV testing.  Fact Sheet for Patients: BloggerCourse.com  Fact Sheet for Healthcare Providers: SeriousBroker.it  This test is not yet approved or cleared by the [place] FDA and has been authorized for detection and/or  diagnosis of SARS-CoV-2 by FDA under an Emergency Use Authorization (EUA). This EUA will remain in effect (meaning this test can be used) for the duration of the COVID-19 declaration under Section 564(b)(1) of the Act, 21 U.S.C. section 360bbb-3(b)(1), unless the authorization is terminated or revoked.     Resp Syncytial Virus by PCR NEGATIVE NEGATIVE Final    Comment: (NOTE) Fact Sheet for Patients: BloggerCourse.com  Fact Sheet for Healthcare Providers: SeriousBroker.it  This test is not yet approved or cleared by the [place] FDA and has been authorized for detection and/or diagnosis of SARS-CoV-2 by FDA under an Emergency Use Authorization (EUA). This EUA will remain in effect (meaning this test can be used) for the duration of the COVID-19 declaration under Section 564(b)(1) of the Act, 21 U.S.C. section 360bbb-3(b)(1), unless the authorization is terminated or revoked.  Performed at [facility], [address] [place], [place] [address]   MRSA Next Gen by PCR, Nasal     Status: None   Collection Time: 10/27/23  9:27 PM   Specimen: Nasal Mucosa; Nasal Swab  Result Value Ref Range Status   MRSA by PCR Next Gen NOT DETECTED NOT DETECTED Final    Comment: (NOTE) The GeneXpert MRSA Assay (FDA approved for NASAL specimens only), is one component of a comprehensive MRSA colonization surveillance program. It is not intended to diagnose MRSA infection nor to guide or monitor treatment for MRSA infections. Test performance is not FDA approved in patients less than 22 years old. Performed at [facility], [address], [place], [place] [address]     Anti-infectives:  Anti-infectives (From admission, onward)    None  Consults: Treatment Team:  Tressie Stalker, MD    Studies:    Events:  Subjective:    Overnight Issues:  Was over sedated then was agitated after phenobarb  stopped Objective:  Vital signs for last 24 hours: Temp:  [97.1 F (36.2 C)-99 F (37.2 C)] 98.4 F (36.9 C) (11/14 0800) Pulse Rate:  [59-98] 90 (11/14 0800) Resp:  [12-32] 16 (11/14 0800) BP: (88-196)/(43-153) 166/73 (11/14 0800) SpO2:  [88 %-100 %] 100 % (11/14 0800) FiO2 (%):  [40 %-100 %] 50 % (11/14 0735)  Hemodynamic parameters for last 24 hours:    Intake/Output from previous day: 11/13 0701 - 11/14 0700 In: 1780.8 [I.V.:585.4; NG/GT:795; IV Piggyback:400.4] Out: 325 [Urine:325]  Intake/Output this shift: Total I/O In: 40 [NG/GT:40] Out: -   Vent settings for last 24 hours: Vent Mode: PCV;BIPAP FiO2 (%):  [40 %-100 %] 50 % Set Rate:  [20 bmp] 20 bmp PEEP:  [8 cmH20] 8 cmH20 Pressure Support:  [23 cmH20] 23 cmH20  Physical Exam:  General: sleepy Neuro: arouses and MAE HEENT/Neck: BiPAP Resp: clear to auscultation bilaterally CVS: RRR GI: soft, NT Extremities: edema 1+ correction 2+  Results for orders placed or performed during the hospital encounter of 10/27/23 (from the past 24 hour(s))  Glucose, capillary     Status: Abnormal   Collection Time: 11/02/23 11:28 AM  Result Value Ref Range   Glucose-Capillary 132 (H) 70 - 99 mg/dL  Glucose, capillary     Status: Abnormal   Collection Time: 11/02/23  3:43 PM  Result Value Ref Range   Glucose-Capillary 182 (H) 70 - 99 mg/dL  I-STAT 7, (LYTES, BLD GAS, ICA, H+H)     Status: Abnormal   Collection Time: 11/02/23  3:52 PM  Result Value Ref Range   pH, Arterial 7.297 (L) 7.35 - 7.45   pCO2 arterial 65.6 (HH) 32 - 48 mmHg   pO2, Arterial 287 (H) 83 - 108 mmHg   Bicarbonate 32.3 (H) 20.0 - 28.0 mmol/L   TCO2 34 (H) 22 - 32 mmol/L   O2 Saturation 100 %   Acid-Base Excess 4.0 (H) 0.0 - 2.0 mmol/L   Sodium 140 135 - 145 mmol/L   Potassium 3.1 (L) 3.5 - 5.1 mmol/L   Calcium, Ion 1.27 1.15 - 1.40 mmol/L   HCT 33.0 (L) 36.0 - 46.0 %   Hemoglobin 11.2 (L) 12.0 - 15.0 g/dL   Patient temperature 16.1 F     Collection site RADIAL, ALLEN'S TEST ACCEPTABLE    Drawn by RT    Sample type ARTERIAL    Comment NOTIFIED PHYSICIAN   Magnesium     Status: None   Collection Time: 11/02/23  5:59 PM  Result Value Ref Range   Magnesium 1.7 1.7 - 2.4 mg/dL  Phosphorus     Status: None   Collection Time: 11/02/23  5:59 PM  Result Value Ref Range   Phosphorus 3.8 2.5 - 4.6 mg/dL  Glucose, capillary     Status: Abnormal   Collection Time: 11/02/23  7:46 PM  Result Value Ref Range   Glucose-Capillary 115 (H) 70 - 99 mg/dL  Glucose, capillary     Status: Abnormal   Collection Time: 11/02/23 11:51 PM  Result Value Ref Range   Glucose-Capillary 127 (H) 70 - 99 mg/dL  Glucose, capillary     Status: Abnormal   Collection Time: 11/03/23  3:40 AM  Result Value Ref Range   Glucose-Capillary 113 (H) 70 - 99 mg/dL  Glucose, capillary  Status: Abnormal   Collection Time: 11/03/23  7:15 AM  Result Value Ref Range   Glucose-Capillary 156 (H) 70 - 99 mg/dL    Assessment & Plan: Present on Admission:  Closed unstable burst fracture of T5 vertebra (HCC)    LOS: 7 days   Additional comments:I reviewed the patient's new clinical lab test results. / 72F s/p fall   SAH, IVH - NSGY c/s, Dr. Jake Samples, repeat head CT reviewed, keppra x7d, SLP c/s AF on eliquis - rec'd Andexxa, hold for now Subacute T5 burst fx - MRI completed, Being managed pre-admission by Dr. Lovell Sheehan. Brace at bedside. Okay to ambulate, monitor for neuro sx after ambulation. ETOH withdrawl - precedex is off, psych c/s with med adjustment, required several doses of ativan overnight so will resume phenobarb at low dose for 48h HTN - on home meds, add scheduled hydralazine R 6th rib - pain control, pulm toilet  FEN- ST re-eval rec NPO, TF VTE- LMWH ID- no issues Dispo- ICU, therapies, GCO updated yesterday to allow intubation if needed Critical Care Total Time*: 33 Minutes  [name], MD, MPH, FACS Trauma & General Surgery Use  [email or web address] to contact on call provider  11/03/2023  *Care during the described time interval was provided by me. I have reviewed this patient's available data, including medical history, events of note, physical examination and test results as part of my evaluation.

## 2023-11-04 ENCOUNTER — Inpatient Hospital Stay (HOSPITAL_COMMUNITY): Payer: Medicare PPO | Admitting: Certified Registered Nurse Anesthetist

## 2023-11-04 ENCOUNTER — Inpatient Hospital Stay (HOSPITAL_COMMUNITY): Payer: Medicare PPO

## 2023-11-04 DIAGNOSIS — F430 Acute stress reaction: Secondary | ICD-10-CM

## 2023-11-04 DIAGNOSIS — S22052S Unstable burst fracture of T5-T6 vertebra, sequela: Secondary | ICD-10-CM | POA: Diagnosis not present

## 2023-11-04 DIAGNOSIS — J9601 Acute respiratory failure with hypoxia: Secondary | ICD-10-CM

## 2023-11-04 DIAGNOSIS — F10931 Alcohol use, unspecified with withdrawal delirium: Secondary | ICD-10-CM | POA: Diagnosis not present

## 2023-11-04 LAB — CBC
HCT: 36.6 % (ref 36.0–46.0)
Hemoglobin: 11.9 g/dL — ABNORMAL LOW (ref 12.0–15.0)
MCH: 33.4 pg (ref 26.0–34.0)
MCHC: 32.5 g/dL (ref 30.0–36.0)
MCV: 102.8 fL — ABNORMAL HIGH (ref 80.0–100.0)
Platelets: 237 10*3/uL (ref 150–400)
RBC: 3.56 MIL/uL — ABNORMAL LOW (ref 3.87–5.11)
RDW: 13 % (ref 11.5–15.5)
WBC: 11 10*3/uL — ABNORMAL HIGH (ref 4.0–10.5)
nRBC: 0 % (ref 0.0–0.2)

## 2023-11-04 LAB — BASIC METABOLIC PANEL
Anion gap: 9 (ref 5–15)
BUN: 22 mg/dL (ref 8–23)
CO2: 31 mmol/L (ref 22–32)
Calcium: 8.9 mg/dL (ref 8.9–10.3)
Chloride: 102 mmol/L (ref 98–111)
Creatinine, Ser: 0.48 mg/dL (ref 0.44–1.00)
GFR, Estimated: >60 mL/min (ref >60)
Glucose, Bld: 178 mg/dL — ABNORMAL HIGH (ref 70–99)
Potassium: 3.3 mmol/L — ABNORMAL LOW (ref 3.5–5.1)
Sodium: 142 mmol/L (ref 135–145)

## 2023-11-04 LAB — POCT I-STAT 7, (LYTES, BLD GAS, ICA,H+H)
Acid-Base Excess: 7 mmol/L — ABNORMAL HIGH (ref 0.0–2.0)
Bicarbonate: 34.7 mmol/L — ABNORMAL HIGH (ref 20.0–28.0)
Calcium, Ion: 1.26 mmol/L (ref 1.15–1.40)
HCT: 32 % — ABNORMAL LOW (ref 36.0–46.0)
Hemoglobin: 10.9 g/dL — ABNORMAL LOW (ref 12.0–15.0)
O2 Saturation: 99 %
Patient temperature: 36.8
Potassium: 3.5 mmol/L (ref 3.5–5.1)
Sodium: 142 mmol/L (ref 135–145)
TCO2: 37 mmol/L — ABNORMAL HIGH (ref 22–32)
pCO2 arterial: 64 mmHg — ABNORMAL HIGH (ref 32–48)
pH, Arterial: 7.342 — ABNORMAL LOW (ref 7.35–7.45)
pO2, Arterial: 160 mmHg — ABNORMAL HIGH (ref 83–108)

## 2023-11-04 LAB — GLUCOSE, CAPILLARY
Glucose-Capillary: 148 mg/dL — ABNORMAL HIGH (ref 70–99)
Glucose-Capillary: 153 mg/dL — ABNORMAL HIGH (ref 70–99)
Glucose-Capillary: 171 mg/dL — ABNORMAL HIGH (ref 70–99)
Glucose-Capillary: 174 mg/dL — ABNORMAL HIGH (ref 70–99)
Glucose-Capillary: 175 mg/dL — ABNORMAL HIGH (ref 70–99)
Glucose-Capillary: 192 mg/dL — ABNORMAL HIGH (ref 70–99)

## 2023-11-04 LAB — MAGNESIUM: Magnesium: 2 mg/dL (ref 1.7–2.4)

## 2023-11-04 LAB — PHOSPHORUS: Phosphorus: 2.6 mg/dL (ref 2.5–4.6)

## 2023-11-04 MED ORDER — PROPOFOL BOLUS VIA INFUSION: 10.0000 mg | INTRAVENOUS | Status: DC | PRN

## 2023-11-04 MED ORDER — TRAZODONE HCL 50 MG PO TABS
100.0000 mg | ORAL_TABLET | Freq: Every day | ORAL | Status: DC
  Administered 2023-11-04 – 2023-11-09 (×6): 100 mg
  Filled 2023-11-04 (×6): qty 2

## 2023-11-04 MED ORDER — PROPOFOL 1000 MG/100ML IV EMUL
5.0000 ug/kg/min | INTRAVENOUS | Status: DC
  Administered 2023-11-04 – 2023-11-05 (×2): 20 ug/kg/min via INTRAVENOUS
  Administered 2023-11-05 (×2): 30 ug/kg/min via INTRAVENOUS
  Administered 2023-11-06: 10 ug/kg/min via INTRAVENOUS
  Administered 2023-11-06: 25 ug/kg/min via INTRAVENOUS
  Administered 2023-11-07 – 2023-11-08 (×3): 20 ug/kg/min via INTRAVENOUS
  Filled 2023-11-04 (×11): qty 100

## 2023-11-04 MED ORDER — SODIUM CHLORIDE 0.9 % IV BOLUS
1000.0000 mL | Freq: Once | INTRAVENOUS | Status: AC
  Administered 2023-11-04: 1000 mL via INTRAVENOUS

## 2023-11-04 MED ORDER — POTASSIUM CHLORIDE 20 MEQ PO PACK
40.0000 meq | PACK | Freq: Once | ORAL | Status: AC
  Administered 2023-11-04: 40 meq
  Filled 2023-11-04: qty 2

## 2023-11-04 MED ORDER — FUROSEMIDE 10 MG/ML IJ SOLN
40.0000 mg | Freq: Once | INTRAMUSCULAR | Status: AC
  Administered 2023-11-04: 40 mg via INTRAVENOUS
  Filled 2023-11-04: qty 4

## 2023-11-04 MED ORDER — SUCCINYLCHOLINE CHLORIDE 200 MG/10ML IV SOSY
PREFILLED_SYRINGE | INTRAVENOUS | Status: DC | PRN
  Administered 2023-11-04: 120 mg via INTRAVENOUS

## 2023-11-04 MED ORDER — FENTANYL 2500MCG IN NS 250ML (10MCG/ML) PREMIX INFUSION
0.0000 ug/h | INTRAVENOUS | Status: DC
  Administered 2023-11-04 – 2023-11-07 (×3): 50 ug/h via INTRAVENOUS
  Administered 2023-11-08 – 2023-11-10 (×5): 200 ug/h via INTRAVENOUS
  Filled 2023-11-04 (×8): qty 250

## 2023-11-04 MED ORDER — PHENYLEPHRINE 80 MCG/ML (10ML) SYRINGE FOR IV PUSH (FOR BLOOD PRESSURE SUPPORT)
PREFILLED_SYRINGE | INTRAVENOUS | Status: DC | PRN
  Administered 2023-11-04: 160 ug via INTRAVENOUS
  Administered 2023-11-04: 240 ug via INTRAVENOUS

## 2023-11-04 MED ORDER — HALOPERIDOL LACTATE 2 MG/ML PO CONC
5.0000 mg | Freq: Three times a day (TID) | ORAL | Status: DC
  Administered 2023-11-04 – 2023-11-10 (×18): 5 mg
  Filled 2023-11-04 (×20): qty 5

## 2023-11-04 MED ORDER — PROPOFOL 10 MG/ML IV BOLUS
INTRAVENOUS | Status: DC | PRN
  Administered 2023-11-04: 150 mg via INTRAVENOUS

## 2023-11-04 MED ORDER — FENTANYL BOLUS VIA INFUSION
25.0000 ug | INTRAVENOUS | Status: DC | PRN
  Administered 2023-11-08 – 2023-11-10 (×19): 25 ug via INTRAVENOUS

## 2023-11-04 NOTE — Progress Notes (Signed)
Patient desatted earlier in the evening off BiPAP and did not improve on BiPAP. Was NT suctioned with subsequent improvement on saturations. Evaluated at bedside. Later this evening concern for mental status and not protecting airway. Given lethargy, did not feel that patient would be able to pull off BiPAP if needed. Had not received any recent narcotics. Elected to have patient intubated. Patient again evaluated at bedside during this time. Called patient's husband and updated.   Total critical care time: 35 minutes

## 2023-11-04 NOTE — Progress Notes (Signed)
Patient ID: [name], female   DOB: Sep 16, 1951, 73 y.o.   MRN: [id number] Follow up - Trauma Critical Care   Patient Details:    [name] is an 72 y.o. female.  Lines/tubes : Implanted Port 09/02/20 (Active)     Urethral Catheter A Duffy Rhody RN Non-latex 14 Fr. (Active)  Indication for Insertion or Continuance of Catheter Unstable spinal/crush injuries / Multisystem Trauma 11/04/23 0800  Site Assessment Clean, Dry, Intact 11/04/23 0800  Catheter Maintenance Bag below level of bladder;Catheter secured;Drainage bag/tubing not touching floor;Insertion date on drainage bag;No dependent loops;Seal intact;Bag emptied prior to transport 11/04/23 0800  Collection Container Standard drainage bag 11/04/23 0800  Securement Method Adhesive securement device 11/04/23 0800  Urinary Catheter Interventions (if applicable) Unclamped 11/03/23 2000  Output (mL) 75 mL 11/04/23 0500    Microbiology/Sepsis markers: Results for orders placed or performed during the hospital encounter of 11/19/2023  Resp panel by RT-PCR (RSV, Flu A&B, Covid) Anterior Nasal Swab     Status: None   Collection Time: 11/05/2023  4:12 PM   Specimen: Anterior Nasal Swab  Result Value Ref Range Status   SARS Coronavirus 2 by RT PCR NEGATIVE NEGATIVE Final   Influenza A by PCR NEGATIVE NEGATIVE Final   Influenza B by PCR NEGATIVE NEGATIVE Final    Comment: (NOTE) The Xpert Xpress SARS-CoV-2/FLU/RSV plus assay is intended as an aid in the diagnosis of influenza from Nasopharyngeal swab specimens and should not be used as a sole basis for treatment. Nasal washings and aspirates are unacceptable for Xpert Xpress SARS-CoV-2/FLU/RSV testing.  Fact Sheet for Patients: BloggerCourse.com  Fact Sheet for Healthcare Providers: SeriousBroker.it  This test is not yet approved or cleared by the [place] FDA and has been authorized for detection and/or diagnosis of  SARS-CoV-2 by FDA under an Emergency Use Authorization (EUA). This EUA will remain in effect (meaning this test can be used) for the duration of the COVID-19 declaration under Section 564(b)(1) of the Act, 21 U.S.C. section 360bbb-3(b)(1), unless the authorization is terminated or revoked.     Resp Syncytial Virus by PCR NEGATIVE NEGATIVE Final    Comment: (NOTE) Fact Sheet for Patients: BloggerCourse.com  Fact Sheet for Healthcare Providers: SeriousBroker.it  This test is not yet approved or cleared by the [place] FDA and has been authorized for detection and/or diagnosis of SARS-CoV-2 by FDA under an Emergency Use Authorization (EUA). This EUA will remain in effect (meaning this test can be used) for the duration of the COVID-19 declaration under Section 564(b)(1) of the Act, 21 U.S.C. section 360bbb-3(b)(1), unless the authorization is terminated or revoked.  Performed at [facility], [address], [place], [place] [address]   MRSA Next Gen by PCR, Nasal     Status: None   Collection Time: 10/31/2023  9:27 PM   Specimen: Nasal Mucosa; Nasal Swab  Result Value Ref Range Status   MRSA by PCR Next Gen NOT DETECTED NOT DETECTED Final    Comment: (NOTE) The GeneXpert MRSA Assay (FDA approved for NASAL specimens only), is one component of a comprehensive MRSA colonization surveillance program. It is not intended to diagnose MRSA infection nor to guide or monitor treatment for MRSA infections. Test performance is not FDA approved in patients less than 25 years old. Performed at [facility], [address], [place], [place] [address]     Anti-infectives:  Anti-infectives (From admission, onward)    None      Consults: Treatment Team:  Tressie Stalker, MD Mariel Craft, MD    Studies:    Events:  Subjective:    Overnight Issues: BiPAP, needed PRNs overnight  Objective:  Vital signs  for last 24 hours: Temp:  [97.3 F (36.3 C)-98.8 F (37.1 C)] 97.6 F (36.4 C) (11/15 0800) Pulse Rate:  [74-101] 88 (11/15 0800) Resp:  [15-32] 30 (11/15 0800) BP: (123-191)/(44-94) 147/61 (11/15 0800) SpO2:  [93 %-100 %] 95 % (11/15 0800) FiO2 (%):  [40 %] 40 % (11/15 0730) Weight:  [104.5 kg] 104.5 kg (11/15 0500)  Hemodynamic parameters for last 24 hours:    Intake/Output from previous day: 11/14 0701 - 11/15 0700 In: 1384 [NG/GT:1284; IV Piggyback:100] Out: 1830 [Urine:1830]  Intake/Output this shift: Total I/O In: 55 [NG/GT:55] Out: -   Vent settings for last 24 hours: Vent Mode: BIPAP;PCV FiO2 (%):  [40 %] 40 % Set Rate:  [20 bmp] 20 bmp PEEP:  [8 cmH20] 8 cmH20  Physical Exam:  General: on BiPAP Neuro: MAE, follows some commands HEENT/Neck: BiPAP Resp: clear to auscultation bilaterally CVS: RRR GI: soft, NT Extremities: some edema  Results for orders placed or performed during the hospital encounter of 10/29/2023 (from the past 24 hour(s))  Glucose, capillary     Status: Abnormal   Collection Time: 11/03/23 11:22 AM  Result Value Ref Range   Glucose-Capillary 145 (H) 70 - 99 mg/dL  Glucose, capillary     Status: Abnormal   Collection Time: 11/03/23  3:42 PM  Result Value Ref Range   Glucose-Capillary 123 (H) 70 - 99 mg/dL  Magnesium     Status: None   Collection Time: 11/03/23  6:40 PM  Result Value Ref Range   Magnesium 1.7 1.7 - 2.4 mg/dL  Phosphorus     Status: None   Collection Time: 11/03/23  6:40 PM  Result Value Ref Range   Phosphorus 3.0 2.5 - 4.6 mg/dL  Glucose, capillary     Status: Abnormal   Collection Time: 11/03/23  7:42 PM  Result Value Ref Range   Glucose-Capillary 124 (H) 70 - 99 mg/dL  Glucose, capillary     Status: Abnormal   Collection Time: 11/03/23 11:42 PM  Result Value Ref Range   Glucose-Capillary 163 (H) 70 - 99 mg/dL  Glucose, capillary     Status: Abnormal   Collection Time: 11/04/23  3:52 AM  Result Value Ref Range    Glucose-Capillary 153 (H) 70 - 99 mg/dL  CBC     Status: Abnormal   Collection Time: 11/04/23  6:21 AM  Result Value Ref Range   WBC 11.0 (H) 4.0 - 10.5 K/uL   RBC 3.56 (L) 3.87 - 5.11 MIL/uL   Hemoglobin 11.9 (L) 12.0 - 15.0 g/dL   HCT 40.3 47.4 - 25.9 %   MCV 102.8 (H) 80.0 - 100.0 fL   MCH 33.4 26.0 - 34.0 pg   MCHC 32.5 30.0 - 36.0 g/dL   RDW 56.3 87.5 - 64.3 %   Platelets 237 150 - 400 K/uL   nRBC 0.0 0.0 - 0.2 %  Basic metabolic panel     Status: Abnormal   Collection Time: 11/04/23  6:21 AM  Result Value Ref Range   Sodium 142 135 - 145 mmol/L   Potassium 3.3 (L) 3.5 - 5.1 mmol/L   Chloride 102 98 - 111 mmol/L   CO2 31 22 - 32 mmol/L   Glucose, Bld 178 (H) 70 - 99 mg/dL   BUN 22 8 - 23 mg/dL  Creatinine, Ser 0.48 0.44 - 1.00 mg/dL   Calcium 8.9 8.9 - 78.2 mg/dL   GFR, Estimated >95 >62 mL/min   Anion gap 9 5 - 15  Magnesium     Status: None   Collection Time: 11/04/23  6:21 AM  Result Value Ref Range   Magnesium 2.0 1.7 - 2.4 mg/dL  Phosphorus     Status: None   Collection Time: 11/04/23  6:21 AM  Result Value Ref Range   Phosphorus 2.6 2.5 - 4.6 mg/dL  Glucose, capillary     Status: Abnormal   Collection Time: 11/04/23  8:00 AM  Result Value Ref Range   Glucose-Capillary 175 (H) 70 - 99 mg/dL    Assessment & Plan: Present on Admission:  Closed unstable burst fracture of T5 vertebra (HCC)    LOS: 8 days   Additional comments:I reviewed the patient's new clinical lab test results. / 52F s/p fall   SAH, IVH - NSGY c/s, Dr. Jake Samples, repeat head CT reviewed, keppra x7d, SLP c/s AF on eliquis - rec'd Andexxa, hold for now Subacute T5 burst fx - MRI completed, Being managed pre-admission by Dr. Lovell Sheehan. Brace at bedside. Okay to ambulate, monitor for neuro sx after ambulation. ETOH withdrawl - low dose phenobarb HTN - on home meds, add scheduled hydralazine R 6th rib - pain control, pulm toilet  FEN- ST re-eval rec NPO, TF, increase HS trazodone, increase  scheduled haldol, lasix 40 x 1, replete hypokalemia VTE- LMWH ID- no issues Dispo- ICU, therapies as able Critical Care Total Time*: 33 Minutes  Isabel Gelinas, MD, MPH, FACS Trauma & General Surgery Use [email or web address] to contact on call provider  11/04/2023  *Care during the described time interval was provided by me. I have reviewed this patient's available data, including medical history, events of note, physical examination and test results as part of my evaluation.

## 2023-11-04 NOTE — Progress Notes (Signed)
Subjective: The patient is without change.    Objective: Vital signs in last 24 hours: Temp:  [97.3 F (36.3 C)-98.8 F (37.1 C)] 97.5 F (36.4 C) (11/15 0400) Pulse Rate:  [71-101] 86 (11/15 0700) Resp:  [15-32] 24 (11/15 0730) BP: (105-191)/(44-94) 160/67 (11/15 0700) SpO2:  [93 %-100 %] 95 % (11/15 0700) FiO2 (%):  [40 %] 40 % (11/15 0730) Weight:  [104.5 kg] 104.5 kg (11/15 0500) Estimated body mass index is 36.08 kg/m as calculated from the following:   Height as of this encounter: 5\' 7"  (1.702 m).   Weight as of this encounter: 104.5 kg.   Intake/Output from previous day: 11/14 0701 - 11/15 0700 In: 1384 [NG/GT:1284; IV Piggyback:100] Out: 1830 [Urine:1830] Intake/Output this shift: No intake/output data recorded.  Physical exam the patient is somnolent but agitated/purposeful when aroused.  She is moving all 4 extremities.  Lab Results: Recent Labs    11/03/23 0919 11/04/23 0621  WBC 7.0 11.0*  HGB 10.8* 11.9*  HCT 34.4* 36.6  PLT 203 237   BMET Recent Labs    11/03/23 0919 11/04/23 0621  NA 140 142  K 3.8 3.3*  CL 104 102  CO2 29 31  GLUCOSE 131* 178*  BUN 22 22  CREATININE 0.63 0.48  CALCIUM 8.4* 8.9    Studies/Results: CT HEAD WO CONTRAST ( )  Result Date: 11/02/2023 CLINICAL DATA:  Head trauma, moderate-severe EXAM: CT HEAD WITHOUT CONTRAST TECHNIQUE: Contiguous axial images were obtained from the base of the skull through the vertex without intravenous contrast. RADIATION DOSE REDUCTION: This exam was performed according to the departmental dose-optimization program which includes automated exposure control, adjustment of the mA and/or kV according to patient size and/or use of iterative reconstruction technique. COMPARISON:  CT head October 28, 2023. FINDINGS: Motion limited study.  Within this limitation: Brain: Decreased small volume subarachnoid hemorrhage which is visible long the anterior left temporal convexity (series 4, image 49;  series 2, image 16). No evidence of new hemorrhage, acute large vascular territory infarct, midline shift or hydrocephalus. Vascular: No hyperdense vessel. Skull: No acute fracture. Sinuses/Orbits: Clear sinuses.  No acute orbital findings. IMPRESSION: Decreased small volume of subarachnoid hemorrhage. No evidence of new/interval acute abnormality. Electronically Signed   By: Feliberto Harts M.D.   On: 11/02/2023 21:47   DG CHEST PORT 1 VIEW  Result Date: 11/02/2023 CLINICAL DATA:  Oxygen desaturation. EXAM: PORTABLE CHEST 1 VIEW COMPARISON:  October 30, 2023. FINDINGS: Stable cardiomegaly. Feeding tube is seen entering stomach. Status post left shoulder arthroplasty. Bibasilar opacities are noted concerning for atelectasis or infiltrates with probable associated pleural effusions. IMPRESSION: Bibasilar opacities concerning for atelectasis or infiltrates with possible pleural effusions. Electronically Signed   By: Lupita Raider M.D.   On: 11/02/2023 17:30   DG Abd Portable 1V  Result Date: 11/02/2023 CLINICAL DATA:  Feeding tube placement. EXAM: PORTABLE ABDOMEN - 1 VIEW COMPARISON:  CT 11/12/2023 FINDINGS: The weighted enteric tube courses into the right abdomen, tip in the left mid abdomen in the region of the distal duodenum. No bowel dilatation to suggest obstruction. IMPRESSION: Weighted enteric tube tip in the region of the distal duodenum. Electronically Signed   By: Narda Rutherford M.D.   On: 11/02/2023 10:13    Assessment/Plan: Alcohol withdrawal: Hopefully this will resolve.  T6 fracture: When her mental status cleared if she can be mobilized in her TLSO.  She should done this while supine in bed.  LOS: 8 days  Cristi Loron 11/04/2023, 7:44 AM

## 2023-11-04 NOTE — Progress Notes (Signed)
SLP Cancellation Note  Patient Details Name: TINEKE HASELEY MRN: [id number] DOB: 13-Mar-1951   Cancelled treatment:       Reason Eval/Treat Not Completed: Medical issues which prohibited therapy. Will continue to f/u.    Gwynneth Aliment, M.A., CF-SLP Speech Language Pathology, Acute Rehabilitation Services  Secure Chat preferred [phone]  11/04/2023, 10:57 AM

## 2023-11-04 NOTE — Consult Note (Signed)
[name] Psychiatry Consult Evaluation  Service Date: November 04, 2023 LOS:  LOS: 8 days    Primary Psychiatric Diagnoses  EtOH w/d delirium  2.  GAD, severe 3.  High risk for Wernicke/Korsakoff   Assessment  [name] is a 72 y.o. female admitted medically for 10/24/2023  3:31 PM after a fall. She carries the psychiatric diagnoses of anxiety, depression, and EtOH use d/o and has a past medical history of  afib, breast cancer, chronic pain, hypothyroidism, spinal stenosis, vit D deficiency. Psychiatry was consulted for anxiety, coping  by Dr. Bedelia [name].  In a prior hospitalization - at that point pt went into severe EtOH w/d, began confabulating, and ultimately required IV thiamine. Discussed this with Dr. Bedelia [name] shortly after consult placed.   At initial consult, the patient's presentation is most consistent with  delirium, most likely due to multiple etiologies including but not limited to alcohol withdrawal infection, medications, pain, altered sleep/wake cycle, and limited mobility. The patient would strongly benefit from medical treatment of alcohol withdrawal, as well as further investigation for etiologies of delirium. Full psychiatric assessment was not able to be completed as pt was not able to engage in a coherent interview - by history (and multiple family member's accounts) anxiety has driven her drinking behavior. Debated phenobarb vs ativan for EtOH w/d (having sx despite being on alpha agonist which can help w/ milder cases) - ultimately went with ativan d/t older age, medical frailty, and most importantly shorter half life (easier to stop/hold if it worsens delirium).  Plan d/w daughter and husband. Pt unable to engage in discussion of r/b/se.  During this time period, minimization of deliriogenic insults will be of utmost importance; this includes promoting the normal circadian cycle, minimizing lines/tubes, avoiding deliriogenic medications such as benzodiazepines and  anticholinergic medications, and frequently reorienting the patient. Symptomatic treatment for agitation can be provided by antipsychotic medications, though it is important to remember that these do not treat the underlying etiology of delirium. Notably, there can be a time lag effect between treatment of a medical problem and resolution of delirium. This time lag effect may be of longer duration in the elderly, and those with underlying cognitive impairment or brain injury.  11/03/2023 Patient seen this morning.  She is in bed wearing BiPAP.  She does not open her eyes to verbal command or touch.  Her daughter, [name] and husband, [name] are at bedside.  They state that patient has not been communicating with them, but appears restless and agitated.  Husband states that in going through the house, he found 35 small bottles of wine.  He does not know the time frame in which patient may have been drinking these, but believes that she increased her drinking approximately 8 weeks ago following a back injury.  Husband reports that patient uses alcohol and pain meds to "self-medicate".  He notes that he has not been sharing a bedroom with his wife over the past year due to her noisy breathing and grunting sounds at night.  He states that she was never diagnosed with an obstructive sleep apnea. In reviewing medications, husband recalls that patient previously did well with Prozac when she was first diagnosed with depression.  He believes that buspirone has been helpful for her anxiety.  He has not noted any significant difference with Cymbalta or Zoloft in treating her mood disorder.  Nursing is at bedside and note that patient becomes extremely agitated every 4 hours as her Ativan wears off.  She will pull at her lines and become very restless in the bed.   11/15: Patient seen, in bed.  She is less restless overall, but increased work of breathing off of her BiPAP.  Family is not at bedside today.  Patient makes  minimal attempt to open eyes, but overhead light is on bright.  Less grunting sounds.  She does not follow commands.  Review of medical administration record shows that time between Ativan administration has been able to be extended to 5-6 hours.   Diagnoses:  Active Hospital problems: Principal Problem:   Closed unstable burst fracture of T5 vertebra (HCC)  Alcohol withdrawal   Plan   ## Psychiatric Medication Recommendations:  -- CONTINUE buspirone 15 TID - home regimen  -- Continue trazodone 50 mg at bedtime (decreased by primary team) -- Start fluoxetine 20 mg daily via tube for depression -- Start Haldol 2 mg per tube 3 times daily to decrease agitation, and assess for decreased need of benzodiazepines -- Continue high dose IV thiamine protocol (500 TID x 9 doses, then oral) -- Continue CIWA + sx triggered ativan - likely start taper when we get a good idea of daily requirements    Good gabapentin candidate- this has not been started  On belbuca 450 at home, getting full agonist opioids currently.   -- Sertraline 50 mg, Cymbalta 60 BID, quetiapine 25 at bedtime, had been discontinued prior to this encounter  ## Medical Decision Making Capacity:  Clearly lacked for most major decisions.  Husband and daughter are at bedside as surrogate decision makers  ## Further Work-up:  -- B1, folate, B12-acceptable range -- Pertinent labwork reviewed earlier this admission includes: MCV 108  -- most recent EKG on 11/07 had QtC of 479 -- Repeat ECG 11/04/2023 was not finalized for review.  ## Disposition:  -- There are no psychiatric contraindications to discharge at this time  ## Behavioral / Environmental:  --  Delirium Precautions: Delirium Interventions for Nursing and Staff: - RN to open blinds every AM. - To Bedside: Glasses, hearing aide, and pt's own shoes. Make available to patients. when possible and encourage use. - Encourage po fluids when appropriate, keep fluids within  reach. - OOB to chair with meals. - Passive ROM exercises to all extremities with AM & PM care. - RN to assess orientation to [name], time and place QAM and PRN. - Recommend extended visitation hours with familiar family/friends as feasible. - Staff to minimize disturbances at night. Turn off television when pt asleep or when not in use.    ## Safety and Observation Level:  - Based on my clinical evaluation, I estimate the patient to be at low risk of self harm in the current setting mostly due to decreased safety awareness   - At this time, we recommend a routine level of observation. This decision is based on my review of the chart including patient's history and current presentation, interview of the patient, mental status examination, and consideration of suicide risk including evaluating suicidal ideation, plan, intent, suicidal or self-harm behaviors, risk factors, and protective factors. This judgment is based on our ability to directly address suicide risk, implement suicide prevention strategies and develop a safety plan while the patient is in the clinical setting. Please contact our team if there is a concern that risk level has changed.  Suicide risk assessment  Patient has following modifiable risk factors for suicide: severe anxiety, EtOH use d/o which we are addressing by addressing meds as appropriate,  providing outpt resources closer to dc.   Patient has following non-modifiable or demographic risk factors for suicide: N/A  Patient has the following protective factors against suicide: Supportive family and no history of suicide attempts   Thank you for this consult request. Recommendations have been communicated to the primary team.  We will continue to follow at this time.   Mariel Craft, MD  Psychiatric and Social History   Relevant Aspects of Hospital Course:  Admitted on 11/09/2023 for a T5 fracture following a fall. They have been progressively more  anxious/confused since. Notably, at time of this note was operating under assumption pt did not want daughter informed of EtOH use.   Patient Report:  Pt seen in AM.  Less agitated and restless in the bed, BiPAP off with labored breathing and making grunting sounds. Appears to be tolerating medication changes.   10/31/2023 Spoke to daughter - pt has been confused, anxious, difficult to redirect. Discussed pt's transient SI statement - will notify staff. Did not discuss EtOH use (at the time, was under impression daughter was not to be informed about EtOH use) Called husband - now OK to loop daughter into EtOH use. Talked through pt's long history of anxiety (through life) and EtOH use (which emerged later in life). Started drinking more when daughter went to college. Anxiety got worse w/ health issues, started self medicating with wine. Thinks most recent falls d/t EtOH but unsure. Never went to EtOH rehab, did benefit from AA - was sober for a year up until around Jan of this  year. Had large increase in use over past 4 weeks - husband not sure how much but many wine bottles found in room. He is guessing about 1 bottle of wine a day on top of pain pills, muscle relaxers. Was eating very little.    Psych ROS:  Pt unable to participate in meaningful ROS d/t AMS.   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, Gaffer, etc Occupational Hx: unknown Living Situation: With  husband Access to weapons: Deferred  Substance History Tobacco use: no Alcohol use: see HPI Drug use: no   Exam Findings   Psychiatric Specialty Exam:  Presentation  General Appearance: Disheveled  Eye Contact:None  Speech:-- (grunting)  Speech Volume:Normal  Handedness:Right   Mood and Affect  Mood:-- (UTA)  Affect:-- (agitated)   Thought Process  Thought Processes:-- (UTA)  Descriptions of Associations:-- (UTA)  Orientation:-- (UTA)  Thought Content:-- (UTA)  Hallucinations:Hallucinations: -- (UTA)   Ideas of Reference:-- (UTA)  Suicidal Thoughts:Suicidal Thoughts: -- (UTA)   Homicidal Thoughts:Homicidal Thoughts: -- (UTA)    Sensorium  Memory:-- (UTA)  Judgment:-- (UTA)  Insight:-- (UTA)   Executive Functions  Concentration:-- (UTA)  Attention Span:-- (UTA)  Recall:-- (UTA)  Fund of Knowledge:-- (UTA)  Language:-- (UTA)   Psychomotor Activity  Psychomotor Activity:Psychomotor Activity: Restlessness    Assets  Assets:Social Support; Housing   Sleep  Sleep:Sleep: -- (UTA)     Physical Exam: Vital signs:  Temp:  [97.3  F (36.3 C)-98.8 F (37.1 C)] 97.6 F (36.4 C) (11/15 0800) Pulse Rate:  [74-101] 88 (11/15 0800) Resp:  [15-32] 30 (11/15 0800) BP: (123-191)/(44-94) 147/61 (11/15 0800) SpO2:  [93 %-100 %] 95 % (11/15 0800) FiO2 (%):  [40 %] 40 % (11/15 0730) Weight:  [104.5 kg] 104.5 kg (11/15 0500) Physical Exam Constitutional:      Appearance: She is obese.  HENT:     Head: Normocephalic.  Pulmonary:     Comments: Wearing BiPAP  Musculoskeletal:     Comments: Able to move upper and lower extremities.  Wearing mitts to avoid pulling at tubes.  Neurological:     Mental Status: She is alert. She is disoriented.     Blood pressure (!) 147/61, pulse 88, temperature 97.6 F (36.4 C), temperature source Axillary, resp. rate (!) 30, height 5\' 7"  (1.702 m), weight 104.5 kg, SpO2 95%. Body mass index is 36.08  kg/m.   Other History   These have been pulled in through the EMR, reviewed, and updated if appropriate.   Family History:  The patient's family history includes Alcoholism in her father; Anxiety disorder in her mother; Dementia in her mother; Depression in her mother; Diabetes in her father and sister; Hypertension in her sister; Stroke in her father; Thyroid disease in her mother.  Medical History: Past Medical History:  Diagnosis Date   Alcohol abuse    Anxiety    Atrial fibrillation [facility])    sees Dr. Flora Lipps   Bilateral swelling of feet    Cancer [facility])    breast   Chronic diarrhea    Chronic pain    Concussion 02/2007   ICU x 3 days   Depression    Hypertension    Hypothyroidism    Personal history of chemotherapy    Personal history of radiation therapy    Spinal stenosis    Thyroid disease ?1994   Vitamin D deficiency     Surgical History: Past Surgical History:  Procedure Laterality Date   BREAST BIOPSY Right 12/20/2019   x2   BREAST LUMPECTOMY Right 01/22/2020   BREAST LUMPECTOMY WITH RADIOACTIVE SEED AND SENTINEL LYMPH NODE BIOPSY Right 01/22/2020   Procedure: RIGHT BREAST LUMPECTOMY WITH RADIOACTIVE SEED X2 AND RIGHT SENTINEL LYMPH NODE MAPPING;  Surgeon: Harriette Bouillon, MD;  Location: [facility];  Service: General;  Laterality: Right;   BREAST SURGERY Right 03/1998   breast biopsy, benign   EYE SURGERY Right    PORT-A-CATH REMOVAL Right 04/30/2021   Procedure: REMOVAL PORT-A-CATH;  Surgeon: Harriette Bouillon, MD;  Location: [facility];  Service: General;  Laterality: Right;   PORTACATH PLACEMENT Right 01/22/2020   Procedure: INSERTION PORT-A-CATH WITH ULTRASOUND;  Surgeon: Harriette Bouillon, MD;  Location: [facility];  Service: General;  Laterality: Right;   PORTACATH PLACEMENT Right 02/28/2020   Procedure: PORT A CATH REVISION;  Surgeon: Harriette Bouillon, MD;  Location: [facility] OR;  Service: General;  Laterality: Right;    REVERSE SHOULDER ARTHROPLASTY Left 02/02/2021   Procedure: LEFT REVERSE SHOULDER ARTHROPLASTY;  Surgeon: Cammy Copa, MD;  Location: [facility] OR;  Service: Orthopedics;  Laterality: Left;   SHOULDER SURGERY Right    SPINAL FUSION  03/04/2011   with ORIF    Medications:   Current Facility-Administered Medications:    acetaminophen (TYLENOL) tablet 1,000 mg, 1,000 mg, Per Tube, Q6H, [name], MD, 1,000 mg at 11/04/23 0517   busPIRone (BUSPAR) tablet 15 mg, 15 mg, Per Tube, TID, [name],  MD, 15 mg at 11/04/23 0902   carvedilol (COREG) tablet 25 mg, 25 mg, Per Tube, BID WC, [name], MD, 25 mg at 11/04/23 0744   Chlorhexidine Gluconate Cloth 2 % PADS 6 each, 6 each, Topical, Daily, Kinsinger, De Blanch, MD, 6 each at 11/04/23 0000   enoxaparin (LOVENOX) injection 40 mg, 40 mg, Subcutaneous, Q12H, Lovick, Lennie Odor, MD, 40 mg at 11/03/23 2305   feeding supplement (PIVOT 1.5 CAL) liquid 1,000 mL, 1,000 mL, Per Tube, Continuous, [name], MD, Last Rate: 55 mL/hr at 11/04/23 0800, Infusion Verify at 11/04/23 0800   FLUoxetine (PROZAC) capsule 20 mg, 20 mg, Per Tube, Daily, Mariel Craft, MD, 20 mg at 11/04/23 0902   folic acid (FOLVITE) tablet 1 mg, 1 mg, Per Tube, Daily, [name], MD, 1 mg at 11/04/23 0902   furosemide (LASIX) injection 40 mg, 40 mg, Intravenous, Once, [name], MD   haloperidol (HALDOL) 2 MG/ML solution 5 mg, 5 mg, Per Tube, TID, [name], MD   haloperidol lactate (HALDOL) injection 10 mg, 10 mg, Intravenous, Q6H PRN, [name], MD, 10 mg at 11/04/23 0242   hydrALAZINE (APRESOLINE) injection 10 mg, 10 mg, Intravenous, Q4H PRN, [name], MD, 10 mg at 11/04/23 0454   hydrALAZINE (APRESOLINE) tablet 50 mg, 50 mg, Per Tube, Q8H, [name], MD, 50 mg at 11/04/23 0517   ketorolac (TORADOL) 15 MG/ML injection 15 mg, 15 mg, Intravenous, Q6H, Lovick, Lennie Odor, MD, 15 mg at 11/04/23 0517   levothyroxine (SYNTHROID)  tablet 150 mcg, 150 mcg, Per Tube, Q0600, [name], MD, 150 mcg at 11/04/23 0517   LORazepam (ATIVAN) injection 1 mg, 1 mg, Intravenous, Q4H PRN, [name], MD, 1 mg at 11/04/23 0454   losartan (COZAAR) tablet 100 mg, 100 mg, Per Tube, Daily, [name], MD, 100 mg at 11/04/23 0902   methocarbamol (ROBAXIN) injection 1,000 mg, 1,000 mg, Intravenous, Q8H, [name], MD, 1,000 mg at 11/04/23 0517   morphine (PF) 4 MG/ML injection 4 mg, 4 mg, Intravenous, Q2H PRN, [name], MD, 4 mg at 11/02/23 5284   multivitamin with minerals tablet 1 tablet, 1 tablet, Per Tube, Daily, [name], MD, 1 tablet at 11/04/23 0902   naloxone [facility]) injection 0.4 mg, 0.4 mg, Intravenous, PRN, Andria Meuse, MD, 0.4 mg at 11/02/23 1505   ondansetron (ZOFRAN-ODT) disintegrating tablet 4 mg, 4 mg, Oral, Q6H PRN **OR** ondansetron (ZOFRAN) injection 4 mg, 4 mg, Intravenous, Q6H PRN, Kinsinger, De Blanch, MD, 4 mg at 10/29/23 1205   Oral care mouth rinse, 15 mL, Mouth Rinse, PRN, Kinsinger, De Blanch, MD   Oral care mouth rinse, 15 mL, Mouth Rinse, 4 times per day, Stechschulte, Hyman Hopes, MD, 15 mL at 11/04/23 0745   oxyCODONE (Oxy IR/ROXICODONE) immediate release tablet 5-10 mg, 5-10 mg, Per Tube, Q4H PRN, [name], MD, 10 mg at 11/02/23 2102   PHENobarbital (LUMINAL) tablet 32.4 mg, 32.4 mg, Per Tube, Q8H, [name], MD, 32.4 mg at 11/04/23 0906   polyethylene glycol (MIRALAX / GLYCOLAX) packet 17 g, 17 g, Per Tube, Daily PRN, [name], MD   polyethylene glycol (MIRALAX / GLYCOLAX) packet 17 g, 17 g, Per Tube, Daily, [name], MD, 17 g at 11/02/23 1052   potassium chloride (KLOR-CON) packet 40 mEq, 40 mEq, Per Tube, Once, [name], MD   senna [facility]) tablet 17.2 mg, 2 tablet, Per Tube, BID, [name], MD, 17.2 mg at 11/03/23 2106   sodium chloride (OCEAN) 0.65 %  nasal spray 1 spray, 1 spray, Each Nare, PRN, [name], MD, 1 spray at  10/30/23 1156   thiamine (VITAMIN B1) 200 mg in sodium chloride 0.9 % 50 mL IVPB, 200 mg, Intravenous, Q24H, Starkes-Perry, Juel Burrow, FNP   traZODone (DESYREL) tablet 100 mg, 100 mg, Per Tube, QHS, [name], MD  Allergies: Allergies  Allergen Reactions   Zolpidem Tartrate Other (See Comments)    Hallucinations and Confusion

## 2023-11-04 NOTE — Progress Notes (Signed)
Pt trialed off BiPap. Pt desatted into 80s, RT made aware and pt placed back on BiPap, sats continued to drop, FiO2 increased. Sats continued to decline. RT at bedside and adjusting PEEP, sats in low 80s. Pt NT suctioned and placed back on BiPap. Sats returned to 90s. MD and TRN at bedside.

## 2023-11-04 NOTE — Progress Notes (Signed)
Trauma Event Note  TRN to patient bedside, patient with worsening mental status, patient on Bipap. Per RN, patient mental status worsening throughout the day, on assessment patient GCS 13. Decision at this time made by Trauma MD to intubate patient. Anesthesia to bedside. Patient intubated without difficulty. New orders placed by Trauma MD. Patient husband also updated by Trauma MD.  Last imported Vital Signs BP (!) 196/93 Comment: will give prn and scheduled hydralazine  Pulse 99   Temp 98.2 F (36.8 C) (Axillary)   Resp (!) 23   Ht 5\' 7"  (1.702 m)   Wt 230 lb 6.1 oz (104.5 kg)   LMP  (LMP Unknown)   SpO2 93%   BMI 36.08 kg/m   Trending CBC Recent Labs    11/02/23 0544 11/02/23 1552 11/03/23 0919 11/04/23 0621  WBC 5.8  --  7.0 11.0*  HGB 11.9* 11.2* 10.8* 11.9*  HCT 37.4 33.0* 34.4* 36.6  PLT 188  --  203 237    Trending Coag's No results for input(s): "APTT", "INR" in the last 72 hours.  Trending BMET Recent Labs    11/02/23 0544 11/02/23 1552 11/03/23 0919 11/04/23 0621  NA 140 140 140 142  K 3.6 3.1* 3.8 3.3*  CL 101  --  104 102  CO2 31  --  29 31  BUN 10  --  22 22  CREATININE 0.69  --  0.63 0.48  GLUCOSE 134*  --  131* 178*      Iveth Heidemann  Trauma Response RN  Please call TRN at [phone] for further assistance.

## 2023-11-04 NOTE — Progress Notes (Signed)
PT Cancellation Note  Patient Details Name: [name] MRN: [id number] DOB: [birth year]   Cancelled Treatment:    Reason Eval/Treat Not Completed: Medical issues which prohibited therapy;Patient not medically ready - lethargic, just came off bipap and sats 85% with increased work of breathing, pt not appropriate for PT at this time.   Marye Round, PT DPT [facility] Secure Chat Preferred  Office [phone]    Truddie Coco 11/04/2023, 2:35 PM

## 2023-11-04 NOTE — Anesthesia Procedure Notes (Signed)
Procedure Name: Intubation Date/Time: 11/04/2023 9:51 PM  Performed by: Tressia Miners, CRNAPre-anesthesia Checklist: Patient identified, Emergency Drugs available, Suction available, Patient being monitored and Timeout performed Patient Re-evaluated:Patient Re-evaluated prior to induction Oxygen Delivery Method: Ambu bag Preoxygenation: Pre-oxygenation with 100% oxygen Induction Type: IV induction, Rapid sequence and Cricoid Pressure applied Laryngoscope Size: 4 and Glidescope Grade View: Grade I Tube type: Oral Tube size: 7.5 mm Number of attempts: 1 Airway Equipment and Method: Stylet Placement Confirmation: ETT inserted through vocal cords under direct vision, positive ETCO2 and breath sounds checked- equal and bilateral Secured at: 23 cm Tube secured with: Tape Dental Injury: Teeth and Oropharynx as per pre-operative assessment  Comments: Smooth IV Induction. Eyes taped. RSI Performed. DL x 1 with grade 1 view. Atraumatically placed, teeth and lip remain intact as pre-op. Secured with tape. Bilateral breath sounds +/=, EtCO2 +, Adequate TV, VSS.

## 2023-11-05 DIAGNOSIS — F430 Acute stress reaction: Secondary | ICD-10-CM

## 2023-11-05 LAB — BASIC METABOLIC PANEL
Anion gap: 8 (ref 5–15)
BUN: 28 mg/dL — ABNORMAL HIGH (ref 8–23)
CO2: 32 mmol/L (ref 22–32)
Calcium: 8.8 mg/dL — ABNORMAL LOW (ref 8.9–10.3)
Chloride: 103 mmol/L (ref 98–111)
Creatinine, Ser: 0.57 mg/dL (ref 0.44–1.00)
GFR, Estimated: >60 mL/min (ref >60)
Glucose, Bld: 180 mg/dL — ABNORMAL HIGH (ref 70–99)
Potassium: 3.6 mmol/L (ref 3.5–5.1)
Sodium: 143 mmol/L (ref 135–145)

## 2023-11-05 LAB — URINALYSIS, ROUTINE W REFLEX MICROSCOPIC
Bacteria, UA: NONE SEEN
Bilirubin Urine: NEGATIVE
Glucose, UA: NEGATIVE mg/dL
Hgb urine dipstick: NEGATIVE
Ketones, ur: NEGATIVE mg/dL
Leukocytes,Ua: NEGATIVE
Nitrite: NEGATIVE
Protein, ur: 30 mg/dL — AB
Specific Gravity, Urine: 1.029 (ref 1.005–1.030)
pH: 5 (ref 5.0–8.0)

## 2023-11-05 LAB — POCT I-STAT 7, (LYTES, BLD GAS, ICA,H+H)
Acid-Base Excess: 8 mmol/L — ABNORMAL HIGH (ref 0.0–2.0)
Acid-Base Excess: 7 mmol/L — ABNORMAL HIGH (ref 0.0–2.0)
Bicarbonate: 34.9 mmol/L — ABNORMAL HIGH (ref 20.0–28.0)
Bicarbonate: 34.9 mmol/L — ABNORMAL HIGH (ref 20.0–28.0)
Calcium, Ion: 1.27 mmol/L (ref 1.15–1.40)
Calcium, Ion: 1.26 mmol/L (ref 1.15–1.40)
HCT: 32 % — ABNORMAL LOW (ref 36.0–46.0)
HCT: 30 % — ABNORMAL LOW (ref 36.0–46.0)
Hemoglobin: 10.9 g/dL — ABNORMAL LOW (ref 12.0–15.0)
Hemoglobin: 10.2 g/dL — ABNORMAL LOW (ref 12.0–15.0)
O2 Saturation: 81 %
O2 Saturation: 93 %
Patient temperature: 98.6
Patient temperature: 98.9
Potassium: 3.4 mmol/L — ABNORMAL LOW (ref 3.5–5.1)
Potassium: 3.3 mmol/L — ABNORMAL LOW (ref 3.5–5.1)
Sodium: 142 mmol/L (ref 135–145)
Sodium: 142 mmol/L (ref 135–145)
TCO2: 37 mmol/L — ABNORMAL HIGH (ref 22–32)
TCO2: 37 mmol/L — ABNORMAL HIGH (ref 22–32)
pCO2 arterial: 63.2 mmHg — ABNORMAL HIGH (ref 32–48)
pCO2 arterial: 65 mmHg — ABNORMAL HIGH (ref 32–48)
pH, Arterial: 7.351 (ref 7.35–7.45)
pH, Arterial: 7.338 — ABNORMAL LOW (ref 7.35–7.45)
pO2, Arterial: 49 mmHg — ABNORMAL LOW (ref 83–108)
pO2, Arterial: 76 mmHg — ABNORMAL LOW (ref 83–108)

## 2023-11-05 LAB — GLUCOSE, CAPILLARY
Glucose-Capillary: 200 mg/dL — ABNORMAL HIGH (ref 70–99)
Glucose-Capillary: 163 mg/dL — ABNORMAL HIGH (ref 70–99)
Glucose-Capillary: 149 mg/dL — ABNORMAL HIGH (ref 70–99)
Glucose-Capillary: 170 mg/dL — ABNORMAL HIGH (ref 70–99)
Glucose-Capillary: 136 mg/dL — ABNORMAL HIGH (ref 70–99)

## 2023-11-05 LAB — CBC
HCT: 33.6 % — ABNORMAL LOW (ref 36.0–46.0)
Hemoglobin: 10.4 g/dL — ABNORMAL LOW (ref 12.0–15.0)
MCH: 32.5 pg (ref 26.0–34.0)
MCHC: 31 g/dL (ref 30.0–36.0)
MCV: 105 fL — ABNORMAL HIGH (ref 80.0–100.0)
Platelets: 223 10*3/uL (ref 150–400)
RBC: 3.2 MIL/uL — ABNORMAL LOW (ref 3.87–5.11)
RDW: 13.2 % (ref 11.5–15.5)
WBC: 11 10*3/uL — ABNORMAL HIGH (ref 4.0–10.5)
nRBC: 0 % (ref 0.0–0.2)

## 2023-11-05 LAB — TRIGLYCERIDES: Triglycerides: 301 mg/dL — ABNORMAL HIGH (ref <150)

## 2023-11-05 MED ORDER — LACTULOSE 10 GM/15ML PO SOLN
20.0000 g | Freq: Three times a day (TID) | ORAL | Status: DC
  Administered 2023-11-05 (×3): 20 g
  Filled 2023-11-05 (×3): qty 30

## 2023-11-05 MED ORDER — OXYCODONE HCL 5 MG PO TABS
5.0000 mg | ORAL_TABLET | ORAL | Status: DC | PRN
  Administered 2023-11-05 – 2023-11-06 (×2): 10 mg
  Administered 2023-11-06 – 2023-11-07 (×2): 5 mg
  Administered 2023-11-07 – 2023-11-08 (×3): 10 mg
  Administered 2023-11-08 (×2): 5 mg
  Administered 2023-11-09 (×2): 10 mg
  Administered 2023-11-09: 5 mg
  Administered 2023-11-09 – 2023-11-10 (×3): 10 mg
  Filled 2023-11-05 (×2): qty 2
  Filled 2023-11-05: qty 1
  Filled 2023-11-05 (×5): qty 2
  Filled 2023-11-05: qty 1
  Filled 2023-11-05: qty 2
  Filled 2023-11-05: qty 1
  Filled 2023-11-05 (×3): qty 2
  Filled 2023-11-05: qty 1

## 2023-11-05 MED ORDER — ORAL CARE MOUTH RINSE
15.0000 mL | OROMUCOSAL | Status: DC
  Administered 2023-11-05 – 2023-11-10 (×63): 15 mL via OROMUCOSAL

## 2023-11-05 MED ORDER — NOREPINEPHRINE 4 MG/250ML-% IV SOLN
INTRAVENOUS | Status: AC
  Administered 2023-11-05: 4 mg
  Filled 2023-11-05: qty 250

## 2023-11-05 MED ORDER — ORAL CARE MOUTH RINSE: 15.0000 mL | OROMUCOSAL | Status: DC | PRN

## 2023-11-05 MED ORDER — LACTATED RINGERS IV BOLUS
500.0000 mL | Freq: Once | INTRAVENOUS | Status: AC
  Administered 2023-11-05: 500 mL via INTRAVENOUS

## 2023-11-05 NOTE — Progress Notes (Signed)
Trauma/Critical Care Follow Up Note  Subjective:    Overnight Issues:   Objective:  Vital signs for last 24 hours: Temp:  [98.2 F (36.8 C)-100.3 F (37.9 C)] 99.2 F (37.3 C) (11/16 1200) Pulse Rate:  [72-100] 78 (11/16 1200) Resp:  [11-34] 14 (11/16 1200) BP: (62-196)/(35-93) 118/53 (11/16 1200) SpO2:  [86 %-100 %] 92 % (11/16 1200) FiO2 (%):  [36 %-100 %] 40 % (11/16 1051) Weight:  [103.3 kg] 103.3 kg (11/16 0500)  Hemodynamic parameters for last 24 hours:    Intake/Output from previous day: 11/15 0701 - 11/16 0700 In: 2101.7 [I.V.:182.3; NG/GT:1320; IV Piggyback:599.3] Out: 1550 [Urine:1550]  Intake/Output this shift: Total I/O In: 640.8 [I.V.:358.9; NG/GT:231.9; IV Piggyback:50] Out: 102 [Urine:102]  Vent settings for last 24 hours: Vent Mode: PSV;CPAP FiO2 (%):  [36 %-100 %] 40 % Set Rate:  [15 bmp-20 bmp] 18 bmp Vt Set:  [490 mL] 490 mL PEEP:  [5 cmH20-8 cmH20] 8 cmH20 Pressure Support:  [10 cmH20] 10 cmH20 Plateau Pressure:  [21 cmH20] 21 cmH20  Physical Exam:  Gen: comfortable, no distress Neuro: not following commands HEENT: PERRL Neck: supple CV: RRR Pulm: unlabored breathing on mechanical ventilation-pressure support Abd: soft, NT    GU: urine clear and yellow, +spontaneous voids Extr: wwp, no edema  Results for orders placed or performed during the hospital encounter of 10/31/2023 (from the past 24 hour(s))  Glucose, capillary     Status: Abnormal   Collection Time: 11/04/23  3:42 PM  Result Value Ref Range   Glucose-Capillary 148 (H) 70 - 99 mg/dL  Glucose, capillary     Status: Abnormal   Collection Time: 11/04/23  7:53 PM  Result Value Ref Range   Glucose-Capillary 192 (H) 70 - 99 mg/dL  I-STAT 7, (LYTES, BLD GAS, ICA, H+H)     Status: Abnormal   Collection Time: 11/04/23 10:09 PM  Result Value Ref Range   pH, Arterial 7.342 (L) 7.35 - 7.45   pCO2 arterial 64.0 (H) 32 - 48 mmHg   pO2, Arterial 160 (H) 83 - 108 mmHg   Bicarbonate 34.7  (H) 20.0 - 28.0 mmol/L   TCO2 37 (H) 22 - 32 mmol/L   O2 Saturation 99 %   Acid-Base Excess 7.0 (H) 0.0 - 2.0 mmol/L   Sodium 142 135 - 145 mmol/L   Potassium 3.5 3.5 - 5.1 mmol/L   Calcium, Ion 1.26 1.15 - 1.40 mmol/L   HCT 32.0 (L) 36.0 - 46.0 %   Hemoglobin 10.9 (L) 12.0 - 15.0 g/dL   Patient temperature 16.1 C    Collection site RADIAL, ALLEN'S TEST ACCEPTABLE    Drawn by RT    Sample type ARTERIAL   Glucose, capillary     Status: Abnormal   Collection Time: 11/04/23 11:31 PM  Result Value Ref Range   Glucose-Capillary 171 (H) 70 - 99 mg/dL  Glucose, capillary     Status: Abnormal   Collection Time: 11/05/23  3:38 AM  Result Value Ref Range   Glucose-Capillary 200 (H) 70 - 99 mg/dL  CBC     Status: Abnormal   Collection Time: 11/05/23  5:50 AM  Result Value Ref Range   WBC 11.0 (H) 4.0 - 10.5 K/uL   RBC 3.20 (L) 3.87 - 5.11 MIL/uL   Hemoglobin 10.4 (L) 12.0 - 15.0 g/dL   HCT 09.6 (L) 04.5 - 40.9 %   MCV 105.0 (H) 80.0 - 100.0 fL   MCH 32.5 26.0 - 34.0 pg  MCHC 31.0 30.0 - 36.0 g/dL   RDW 40.9 81.1 - 91.4 %   Platelets 223 150 - 400 K/uL   nRBC 0.0 0.0 - 0.2 %  Basic metabolic panel     Status: Abnormal   Collection Time: 11/05/23  5:50 AM  Result Value Ref Range   Sodium 143 135 - 145 mmol/L   Potassium 3.6 3.5 - 5.1 mmol/L   Chloride 103 98 - 111 mmol/L   CO2 32 22 - 32 mmol/L   Glucose, Bld 180 (H) 70 - 99 mg/dL   BUN 28 (H) 8 - 23 mg/dL   Creatinine, Ser 7.82 0.44 - 1.00 mg/dL   Calcium 8.8 (L) 8.9 - 10.3 mg/dL   GFR, Estimated >95 >62 mL/min   Anion gap 8 5 - 15  Triglycerides     Status: Abnormal   Collection Time: 11/05/23  5:50 AM  Result Value Ref Range   Triglycerides 301 (H) <150 mg/dL  Glucose, capillary     Status: Abnormal   Collection Time: 11/05/23  7:48 AM  Result Value Ref Range   Glucose-Capillary 163 (H) 70 - 99 mg/dL  I-STAT 7, (LYTES, BLD GAS, ICA, H+H)     Status: Abnormal   Collection Time: 11/05/23 10:25 AM  Result Value Ref Range    pH, Arterial 7.351 7.35 - 7.45   pCO2 arterial 63.2 (H) 32 - 48 mmHg   pO2, Arterial 49 (L) 83 - 108 mmHg   Bicarbonate 34.9 (H) 20.0 - 28.0 mmol/L   TCO2 37 (H) 22 - 32 mmol/L   O2 Saturation 81 %   Acid-Base Excess 8.0 (H) 0.0 - 2.0 mmol/L   Sodium 142 135 - 145 mmol/L   Potassium 3.4 (L) 3.5 - 5.1 mmol/L   Calcium, Ion 1.27 1.15 - 1.40 mmol/L   HCT 32.0 (L) 36.0 - 46.0 %   Hemoglobin 10.9 (L) 12.0 - 15.0 g/dL   Patient temperature 13.0 F    Collection site RADIAL, ALLEN'S TEST ACCEPTABLE    Drawn by RT    Sample type ARTERIAL   Urinalysis, Routine w reflex microscopic -Urine, Clean Catch     Status: Abnormal   Collection Time: 11/05/23 10:42 AM  Result Value Ref Range   Color, Urine AMBER (A) YELLOW   APPearance CLEAR CLEAR   Specific Gravity, Urine 1.029 1.005 - 1.030   pH 5.0 5.0 - 8.0   Glucose, UA NEGATIVE NEGATIVE mg/dL   Hgb urine dipstick NEGATIVE NEGATIVE   Bilirubin Urine NEGATIVE NEGATIVE   Ketones, ur NEGATIVE NEGATIVE mg/dL   Protein, ur 30 (A) NEGATIVE mg/dL   Nitrite NEGATIVE NEGATIVE   Leukocytes,Ua NEGATIVE NEGATIVE   RBC / HPF 0-5 0 - 5 RBC/hpf   WBC, UA 0-5 0 - 5 WBC/hpf   Bacteria, UA NONE SEEN NONE SEEN   Squamous Epithelial / HPF 0-5 0 - 5 /HPF   Mucus PRESENT    Hyaline Casts, UA PRESENT   Glucose, capillary     Status: Abnormal   Collection Time: 11/05/23 11:13 AM  Result Value Ref Range   Glucose-Capillary 149 (H) 70 - 99 mg/dL  I-STAT 7, (LYTES, BLD GAS, ICA, H+H)     Status: Abnormal   Collection Time: 11/05/23 12:17 PM  Result Value Ref Range   pH, Arterial 7.338 (L) 7.35 - 7.45   pCO2 arterial 65.0 (H) 32 - 48 mmHg   pO2, Arterial 76 (L) 83 - 108 mmHg   Bicarbonate 34.9 (H) 20.0 - 28.0  mmol/L   TCO2 37 (H) 22 - 32 mmol/L   O2 Saturation 93 %   Acid-Base Excess 7.0 (H) 0.0 - 2.0 mmol/L   Sodium 142 135 - 145 mmol/L   Potassium 3.3 (L) 3.5 - 5.1 mmol/L   Calcium, Ion 1.26 1.15 - 1.40 mmol/L   HCT 30.0 (L) 36.0 - 46.0 %   Hemoglobin  10.2 (L) 12.0 - 15.0 g/dL   Patient temperature 16.1 F    Collection site RADIAL, ALLEN'S TEST ACCEPTABLE    Drawn by RT    Sample type ARTERIAL     Assessment & Plan: The plan of care was discussed with the bedside nurse for the day, who is in agreement with this plan and no additional concerns were raised.   Present on Admission:  Closed unstable burst fracture of T5 vertebra (HCC)    LOS: 9 days   Additional comments:I reviewed the patient's new clinical lab test results.   and I reviewed the patients new imaging test results.    62F s/p fall   SAH, IVH - NSGY c/s, Dr. Jake Samples, repeat head CT reviewed, keppra x7d AF on eliquis - rec'd Andexxa, hold for now Subacute T5 burst fx - MRI completed, Being managed pre-admission by Dr. Lovell Sheehan. Brace at bedside. Okay to ambulate, monitor for neuro sx after ambulation. ETOH withdrawal - low dose phenobarb, now off, lethargy and decline in mentation resulted in intubation 11/15 PM HTN - on home meds, add scheduled hydralazine R 6th rib - pain control, pulm toilet  FEN- NPO, TF, add lactulose for hyperammonemia VTE- LMWH ID- no issues Dispo- ICU  Clinical update provided to patient's husband, daughter, and son-in-law at bedside. Again discussed goals of care and wishes regarding trial of extubation and tracheostomy. Discussed multi-factorial reasons for poor mentation, which was the primary driver for respiratory failure and indication for intubation. Counseled that mentation currently precludes extubation, but that when improved, she will be a candidate for extubation--need to understand husband's wishes in the case of a failed trial of extubation.   Critical Care Total Time: 80 minutes  [name], MD Trauma & General Surgery Please use [email or web address] to contact on call provider  11/05/2023  *Care during the described time interval was provided by me. I have reviewed this patient's available data, including medical history, events  of note, physical examination and test results as part of my evaluation.

## 2023-11-05 NOTE — Consult Note (Signed)
[name] Psychiatry Consult Evaluation  Service Date: November 05, 2023 LOS:  LOS: 9 days    Primary Psychiatric Diagnoses  EtOH w/d delirium  2.  GAD, severe 3.  High risk for Wernicke/Korsakoff   Assessment  [name] is a 72 y.o. female admitted medically for 11/18/2023  3:31 PM after a fall. She carries the psychiatric diagnoses of anxiety, depression, and EtOH use d/o and has a past medical history of  afib, breast cancer, chronic pain, hypothyroidism, spinal stenosis, vit D deficiency. Psychiatry was consulted for anxiety, coping  by Dr. Bedelia Person.  In a prior hospitalization - at that point pt went into severe EtOH w/d, began confabulating, and ultimately required IV thiamine. Discussed this with Dr. Bedelia Person shortly after consult placed.   At initial consult, the patient's presentation is most consistent with  delirium, most likely due to multiple etiologies including but not limited to alcohol withdrawal infection, medications, pain, altered sleep/wake cycle, and limited mobility. The patient would strongly benefit from medical treatment of alcohol withdrawal, as well as further investigation for etiologies of delirium. Full psychiatric assessment was not able to be completed as pt was not able to engage in a coherent interview - by history (and multiple family member's accounts) anxiety has driven her drinking behavior. Debated phenobarb vs ativan for EtOH w/d (having sx despite being on alpha agonist which can help w/ milder cases) - ultimately went with ativan d/t older age, medical frailty, and most importantly shorter half life (easier to stop/hold if it worsens delirium).  Plan d/w daughter and husband. Pt unable to engage in discussion of r/b/se.  During this time period, minimization of deliriogenic insults will be of utmost importance; this includes promoting the normal circadian cycle, minimizing lines/tubes, avoiding deliriogenic medications such as benzodiazepines and  anticholinergic medications, and frequently reorienting the patient. Symptomatic treatment for agitation can be provided by antipsychotic medications, though it is important to remember that these do not treat the underlying etiology of delirium. Notably, there can be a time lag effect between treatment of a medical problem and resolution of delirium. This time lag effect may be of longer duration in the elderly, and those with underlying cognitive impairment or brain injury.  11/03/2023 Patient seen this morning.  She is in bed wearing BiPAP.  She does not open her eyes to verbal command or touch.  Her daughter, [name] and husband, [name] are at bedside.  They state that patient has not been communicating with them, but appears restless and agitated.  Husband states that in going through the house, he found 35 small bottles of wine.  He does not know the time frame in which patient may have been drinking these, but believes that she increased her drinking approximately 8 weeks ago following a back injury.  Husband reports that patient uses alcohol and pain meds to "self-medicate".  He notes that he has not been sharing a bedroom with his wife over the past year due to her noisy breathing and grunting sounds at night.  He states that she was never diagnosed with an obstructive sleep apnea. In reviewing medications, husband recalls that patient previously did well with Prozac when she was first diagnosed with depression.  He believes that buspirone has been helpful for her anxiety.  He has not noted any significant difference with Cymbalta or Zoloft in treating her mood disorder.  Nursing is at bedside and note that patient becomes extremely agitated every 4 hours as her Ativan wears off.  She will pull at her lines and become very restless in the bed.   11/15: Patient seen, in bed.  She is less restless overall, but increased work of breathing off of her BiPAP.  Family is not at bedside today.  Patient makes  minimal attempt to open eyes, but overhead light is on bright.  Less grunting sounds.  She does not follow commands.  Review of medical administration record shows that time between Ativan administration has been able to be extended to 5-6 hours.  11/05/23: Patient seen in her hospital room. She is intubated, heavily sedated, alert and oriented x 0, hence, could not participate in psychiatric evaluation. Family is not at bedside.This Clinical research associate communicated with the nurse taking care of patient who reports that patient was intubated earlier today due to worsening mental status. She denies any aggressive or agitated behavior so far.    Diagnoses:  Active Hospital problems: Principal Problem:   Closed unstable burst fracture of T5 vertebra (HCC)  Alcohol withdrawal   Plan   ## Psychiatric Medication Recommendations:  -- Continue buspirone 15 TID - home regimen  -- Continue trazodone 50 mg at bedtime (decreased by primary team) -- Continue fluoxetine 20 mg daily via tube for depression -- Start Haldol 2 mg per tube 3 times daily to decrease agitation, and assess for decreased need of benzodiazepines -- Continue high dose IV thiamine protocol (500 TID x 9 doses, then oral) -- Continue CIWA + sx triggered ativan - likely start taper when we get a good idea of daily requirements    Good gabapentin candidate- this has not been started  On belbuca 450 at home, getting full agonist opioids currently.   -- Sertraline 50 mg, Cymbalta 60 BID, quetiapine 25 at bedtime, had been discontinued prior to this encounter  ## Medical Decision Making Capacity:  Clearly lacked for most major decisions.  Husband and daughter are at bedside as surrogate decision makers  ## Further Work-up:  -- B1, folate, B12-acceptable range -- Pertinent labwork reviewed earlier this admission includes: MCV 108  -- most recent EKG on 11/07 had QtC of 479 -- Repeat ECG 11/04/2023 was not finalized for review.  ##  Disposition:  -- There are no psychiatric contraindications to discharge at this time --Psychiatric consult service will continue to follow up.   ## Behavioral / Environmental:  --  Delirium Precautions: Delirium Interventions for Nursing and Staff: - RN to open blinds every AM. - To Bedside: Glasses, hearing aide, and pt's own shoes. Make available to patients. when possible and encourage use. - Encourage po fluids when appropriate, keep fluids within reach. - OOB to chair with meals. - Passive ROM exercises to all extremities with AM & PM care. - RN to assess orientation to person, time and place QAM and PRN. - Recommend extended visitation hours with familiar family/friends as feasible. - Staff to minimize disturbances at night. Turn off television when pt asleep or when not in use.    ## Safety and Observation Level:  - Based on my clinical evaluation, I estimate the patient to be at low risk of self harm in the current setting mostly due to decreased safety awareness   - At this time, we recommend a routine level of observation. This decision is based on my review of the chart including patient's history and current presentation, interview of the patient, mental status examination, and consideration of suicide risk including evaluating suicidal ideation, plan, intent, suicidal or self-harm behaviors, risk factors, and protective  factors. This judgment is based on our ability to directly address suicide risk, implement suicide prevention strategies and develop a safety plan while the patient is in the clinical setting. Please contact our team if there is a concern that risk level has changed.  Suicide risk assessment  Patient has following modifiable risk factors for suicide: severe anxiety, EtOH use d/o which we are addressing by addressing meds as appropriate, providing outpt resources closer to dc.   Patient has following non-modifiable or demographic risk factors for suicide:  N/A  Patient has the following protective factors against suicide: Supportive family and no history of suicide attempts   Thank you for this consult request. Recommendations have been communicated to the primary team.  We will continue to follow at this time.   Fredonia Highland, MD  Psychiatric and Social History   Relevant Aspects of Hospital Course:  Admitted on 10/31/2023 for a T5 fracture following a fall. They have been progressively more anxious/confused since. Notably, at time of this note was operating under assumption pt did not want daughter informed of EtOH use.   Patient Report:  Pt seen in AM.  Less agitated and restless in the bed, BiPAP off with labored breathing and making grunting sounds. Appears to be tolerating medication changes.   10/31/2023 Spoke to daughter - pt has been confused, anxious, difficult to redirect. Discussed pt's transient SI statement - will notify staff. Did not discuss EtOH use (at the time, was under impression daughter was not to be informed about EtOH use) Called husband - now OK to loop daughter into EtOH use. Talked through pt's long history of anxiety (through life) and EtOH use (which emerged later in life). Started drinking more when daughter went to college. Anxiety got worse w/ health issues, started self medicating with wine. Thinks most recent falls d/t EtOH but unsure. Never went to EtOH rehab, did benefit from AA - was sober for a year up until around Jan of this  year. Had large increase in use over past 4 weeks - husband not sure how much but many wine bottles found in room. He is guessing about 1 bottle of wine a day on top of pain pills, muscle relaxers. Was eating very little.    Psych ROS:  Pt unable to participate in meaningful ROS d/t AMS.   Collateral information:  Daughter and husband at bedside  Psychiatric History:  Information collected from pt's  husband, EMR  Prev Dx/Sx: GAD>>> depression  Current Psych Provider:  Sharon Seller NP Home Meds (current):  Buspirone 15 BID, Cymbalta 120 daily, trazodone 100 mg, sertraline 50 mg  Previous Med Trials: unclear Therapy: not really   Prior ECT: unlikely Prior Psych Hospitalization: no  Prior Self Harm: no Prior Violence: no  Family Psych History: unclear Family Hx suicide: unclear  Social History:  Educational Hx: Husband describes as Counselling psychologist, Gaffer, etc Occupational Hx: unknown Living Situation: With husband Access to weapons: Deferred  Substance History Tobacco use: no Alcohol use: see HPI Drug use: no   Exam Findings   Psychiatric Specialty Exam:  Presentation  General Appearance: Disheveled  Eye Contact:None  Speech:-- (grunting)  Speech Volume:Normal  Handedness:Right   Mood and Affect  Mood:-- (UTA)  Affect:-- (agitated)   Thought Process  Thought Processes:-- (UTA)  Descriptions of Associations:-- (UTA)  Orientation:-- (UTA)  Thought Content:-- (UTA)  Hallucinations:No data recorded   Ideas of Reference:-- (UTA)  Suicidal Thoughts:No data recorded   Homicidal Thoughts:No data recorded  Sensorium  Memory:-- (UTA)  Judgment:-- (UTA)  Insight:-- (UTA)   Executive Functions  Concentration:-- (UTA)  Attention Span:-- (UTA)  Recall:-- (UTA)  Fund of Knowledge:-- (UTA)  Language:-- (UTA)   Psychomotor Activity  Psychomotor Activity:No data recorded    Assets  Assets:Social Support; Housing   Sleep  Sleep:No data recorded     Physical Exam: Vital signs:  Temp:  [98.2 F (36.8 C)-100.3 F (37.9 C)] 99.2 F (37.3 C) (11/16 1200) Pulse Rate:  [72-100] 86 (11/16 1430) Resp:  [11-34] 19 (11/16 1430) BP: (62-196)/(35-113) 138/62 (11/16 1430) SpO2:  [86 %-100 %] 95 % (11/16 1430) FiO2 (%):  [40 %-100 %] 40 % (11/16 1051) Weight:  [103.3 kg] 103.3 kg (11/16 0500) Physical Exam Constitutional:      Appearance: She is obese.  HENT:     Head: Normocephalic.  Pulmonary:      Comments: Wearing BiPAP  Musculoskeletal:     Comments: Able to move upper and lower extremities.  Wearing mitts to avoid pulling at tubes.  Neurological:     Mental Status: She is alert. She is disoriented.     Blood pressure 138/62, pulse 86, temperature 99.2 F (37.3 C), temperature source Axillary, resp. rate 19, height 5\' 7"  (1.702 m), weight 103.3 kg, SpO2 95%. Body mass index is 35.67 kg/m.   Other History   These have been pulled in through the EMR, reviewed, and updated if appropriate.   Family History:  The patient's family history includes Alcoholism in her father; Anxiety disorder in her mother; Dementia in her mother; Depression in her mother; Diabetes in her father and sister; Hypertension in her sister; Stroke in her father; Thyroid disease in her mother.  Medical History: Past Medical History:  Diagnosis Date   Alcohol abuse    Anxiety    Atrial fibrillation [facility])    sees Dr. Flora Lipps   Bilateral swelling of feet    Cancer [facility])    breast   Chronic diarrhea    Chronic pain    Concussion 02/2007   ICU x 3 days   Depression    Hypertension    Hypothyroidism    Personal history of chemotherapy    Personal history of radiation therapy    Spinal stenosis    Thyroid disease ?1994   Vitamin D deficiency     Surgical History: Past Surgical History:  Procedure Laterality Date   BREAST BIOPSY Right 12/20/2019   x2   BREAST LUMPECTOMY Right 01/22/2020   BREAST LUMPECTOMY WITH RADIOACTIVE SEED AND SENTINEL LYMPH NODE BIOPSY Right 01/22/2020   Procedure: RIGHT BREAST LUMPECTOMY WITH RADIOACTIVE SEED X2 AND RIGHT SENTINEL LYMPH NODE MAPPING;  Surgeon: Harriette Bouillon, MD;  Location: [facility];  Service: General;  Laterality: Right;   BREAST SURGERY Right 03/1998   breast biopsy, benign   EYE SURGERY Right    PORT-A-CATH REMOVAL Right 04/30/2021   Procedure: REMOVAL PORT-A-CATH;  Surgeon: Harriette Bouillon, MD;  Location: [facility];  Service: General;  Laterality: Right;   PORTACATH PLACEMENT Right 01/22/2020   Procedure: INSERTION PORT-A-CATH WITH ULTRASOUND;  Surgeon: Harriette Bouillon, MD;  Location: [facility];  Service: General;  Laterality: Right;   PORTACATH PLACEMENT Right 02/28/2020   Procedure: PORT A CATH REVISION;  Surgeon: Harriette Bouillon, MD;  Location: [facility] OR;  Service: General;  Laterality: Right;   REVERSE SHOULDER ARTHROPLASTY Left 02/02/2021   Procedure: LEFT REVERSE SHOULDER ARTHROPLASTY;  Surgeon: Cammy Copa, MD;  Location: [facility]  OR;  Service: Orthopedics;  Laterality: Left;   SHOULDER SURGERY Right    SPINAL FUSION  03/04/2011   with ORIF    Medications:   Current Facility-Administered Medications:    acetaminophen (TYLENOL) tablet 1,000 mg, 1,000 mg, Per Tube, Q6H, [name], MD, 1,000 mg at 11/05/23 1256   busPIRone (BUSPAR) tablet 15 mg, 15 mg, Per Tube, TID, [name], MD, 15 mg at 11/05/23 1024   carvedilol (COREG) tablet 25 mg, 25 mg, Per Tube, BID WC, Lysle Rubens, MD, 25 mg at 11/04/23 0744   Chlorhexidine Gluconate Cloth 2 % PADS 6 each, 6 each, Topical, Daily, Kinsinger, De Blanch, MD, 6 each at 11/05/23 0430   enoxaparin (LOVENOX) injection 40 mg, 40 mg, Subcutaneous, Q12H, Lovick, Lennie Odor, MD, 40 mg at 11/05/23 1024   feeding supplement (PIVOT 1.5 CAL) liquid 1,000 mL, 1,000 mL, Per Tube, Continuous, [name], MD, Last Rate: 55 mL/hr at 11/05/23 1300, Infusion Verify at 11/05/23 1300   fentaNYL (SUBLIMAZE) bolus via infusion 25 mcg, 25 mcg, Intravenous, Q30 min PRN, Lysle Rubens, MD   fentaNYL in NS (30mcg/ml) infusion-PREMIX, 0-200 mcg/hr, Intravenous, Continuous, Lysle Rubens, MD, Last Rate: 7.5 mL/hr at 11/05/23 1300, 75 mcg/hr at 11/05/23 1300   FLUoxetine (PROZAC) capsule 20 mg, 20 mg, Per Tube, Daily, Mariel Craft, MD, 20 mg at 11/05/23 1023   folic acid (FOLVITE) tablet 1 mg, 1 mg, Per Tube, Daily,  [name], MD, 1 mg at 11/05/23 1023   haloperidol (HALDOL) 2 MG/ML solution 5 mg, 5 mg, Per Tube, TID, [name], MD, 5 mg at 11/05/23 1033   haloperidol lactate (HALDOL) injection 10 mg, 10 mg, Intravenous, Q6H PRN, [name], MD, 10 mg at 11/04/23 0242   hydrALAZINE (APRESOLINE) injection 10 mg, 10 mg, Intravenous, Q4H PRN, [name], MD, 10 mg at 11/04/23 2113   hydrALAZINE (APRESOLINE) tablet 50 mg, 50 mg, Per Tube, Q8H, Lysle Rubens, MD, 50 mg at 11/05/23 0601   lactulose (CHRONULAC) 10 GM/15ML solution 20 g, 20 g, Per Tube, TID, [name], MD, 20 g at 11/05/23 1023   levothyroxine (SYNTHROID) tablet 150 mcg, 150 mcg, Per Tube, Q0600, [name], MD, 150 mcg at 11/05/23 0601   losartan (COZAAR) tablet 100 mg, 100 mg, Per Tube, Daily, [name], MD, 100 mg at 11/04/23 1308   methocarbamol (ROBAXIN) injection 1,000 mg, 1,000 mg, Intravenous, Q8H, [name], MD, 1,000 mg at 11/05/23 0601   multivitamin with minerals tablet 1 tablet, 1 tablet, Per Tube, Daily, [name], MD, 1 tablet at 11/05/23 1023   naloxone [facility]) injection 0.4 mg, 0.4 mg, Intravenous, PRN, Andria Meuse, MD, 0.4 mg at 11/02/23 1505   ondansetron (ZOFRAN-ODT) disintegrating tablet 4 mg, 4 mg, Oral, Q6H PRN **OR** ondansetron (ZOFRAN) injection 4 mg, 4 mg, Intravenous, Q6H PRN, Kinsinger, De Blanch, MD, 4 mg at 10/29/23 1205   Oral care mouth rinse, 15 mL, Mouth Rinse, Q2H, Lysle Rubens, MD, 15 mL at 11/05/23 1417   Oral care mouth rinse, 15 mL, Mouth Rinse, PRN, Lysle Rubens, MD   oxyCODONE (Oxy IR/ROXICODONE) immediate release tablet 5-10 mg, 5-10 mg, Per Tube, Q4H PRN, [name], MD, 10 mg at 11/05/23 1032   polyethylene glycol (MIRALAX / GLYCOLAX) packet 17 g, 17 g, Per Tube, Daily PRN, [name], MD   polyethylene glycol (MIRALAX / GLYCOLAX) packet 17 g, 17 g, Per Tube, Daily, [name], MD, 17 g at 11/02/23 1052   propofol  (  DIPRIVAN) 1000 MG/100ML infusion, 5-80 mcg/kg/min, Intravenous, Titrated, Lysle Rubens, MD, Last Rate: 9.41 mL/hr at 11/05/23 1300, 15 mcg/kg/min at 11/05/23 1300   propofol (DIPRIVAN) bolus via infusion 10-20 mg, 10-20 mg, Intravenous, Q1H PRN, Lysle Rubens, MD   senna [facility]) tablet 17.2 mg, 2 tablet, Per Tube, BID, [name], MD, 17.2 mg at 11/03/23 2106   sodium chloride (OCEAN) 0.65 % nasal spray 1 spray, 1 spray, Each Nare, PRN, [name], MD, 1 spray at 10/30/23 1156   thiamine (VITAMIN B1) 200 mg in sodium chloride 0.9 % 50 mL IVPB, 200 mg, Intravenous, Q24H, Starkes-Perry, Juel Burrow, FNP, Stopped at 11/05/23 1105   traZODone (DESYREL) tablet 100 mg, 100 mg, Per Berenice Primas, MD, 100 mg at 11/04/23 2113  Allergies: Allergies  Allergen Reactions   Zolpidem Tartrate Other (See Comments)    Hallucinations and Confusion

## 2023-11-05 NOTE — Progress Notes (Signed)
Pressure noted to be 77/37 MAP 50. Patient initially with SBP's 120-170's while weaning on vent. BP retaken 62/35 MAP 45. Prop and fent stopped. Low dose Levo started and Dr. Bedelia Person made aware.  Upon assessment, small amount of tan secretions noted under mouth and on gown, possible cough and vagal episode? Will closely monitor pressures, and discontinue Levo as soon as possible.

## 2023-11-05 NOTE — Progress Notes (Signed)
   11/05/23 0900  Vitals  BP (!) 97/44  MAP (mmHg) (!) 54  Pulse Rate 86  ECG Heart Rate 86  Resp (!) 21   Enteral antihypertensives held at this time.

## 2023-11-06 ENCOUNTER — Other Ambulatory Visit: Payer: Self-pay

## 2023-11-06 DIAGNOSIS — Z7189 Other specified counseling: Secondary | ICD-10-CM | POA: Diagnosis not present

## 2023-11-06 DIAGNOSIS — Z515 Encounter for palliative care: Secondary | ICD-10-CM | POA: Diagnosis not present

## 2023-11-06 LAB — GLUCOSE, CAPILLARY
Glucose-Capillary: 150 mg/dL — ABNORMAL HIGH (ref 70–99)
Glucose-Capillary: 145 mg/dL — ABNORMAL HIGH (ref 70–99)
Glucose-Capillary: 140 mg/dL — ABNORMAL HIGH (ref 70–99)
Glucose-Capillary: 149 mg/dL — ABNORMAL HIGH (ref 70–99)
Glucose-Capillary: 165 mg/dL — ABNORMAL HIGH (ref 70–99)

## 2023-11-06 LAB — CBC
HCT: 33.2 % — ABNORMAL LOW (ref 36.0–46.0)
Hemoglobin: 10.2 g/dL — ABNORMAL LOW (ref 12.0–15.0)
MCH: 32.2 pg (ref 26.0–34.0)
MCHC: 30.7 g/dL (ref 30.0–36.0)
MCV: 104.7 fL — ABNORMAL HIGH (ref 80.0–100.0)
Platelets: 267 10*3/uL (ref 150–400)
RBC: 3.17 MIL/uL — ABNORMAL LOW (ref 3.87–5.11)
RDW: 13.4 % (ref 11.5–15.5)
WBC: 13.3 10*3/uL — ABNORMAL HIGH (ref 4.0–10.5)
nRBC: 0 % (ref 0.0–0.2)

## 2023-11-06 LAB — BASIC METABOLIC PANEL
Anion gap: 9 (ref 5–15)
BUN: 29 mg/dL — ABNORMAL HIGH (ref 8–23)
CO2: 30 mmol/L (ref 22–32)
Calcium: 9 mg/dL (ref 8.9–10.3)
Chloride: 104 mmol/L (ref 98–111)
Creatinine, Ser: 0.51 mg/dL (ref 0.44–1.00)
GFR, Estimated: >60 mL/min (ref >60)
Glucose, Bld: 175 mg/dL — ABNORMAL HIGH (ref 70–99)
Potassium: 3.6 mmol/L (ref 3.5–5.1)
Sodium: 143 mmol/L (ref 135–145)

## 2023-11-06 MED ORDER — LOSARTAN POTASSIUM 50 MG PO TABS
100.0000 mg | ORAL_TABLET | Freq: Every day | ORAL | Status: DC
  Administered 2023-11-06: 100 mg
  Filled 2023-11-06: qty 2

## 2023-11-06 MED ORDER — HYDRALAZINE HCL 25 MG PO TABS
25.0000 mg | ORAL_TABLET | Freq: Three times a day (TID) | ORAL | Status: DC
Start: 2023-11-06 — End: 2023-11-08
  Administered 2023-11-06 (×2): 25 mg
  Filled 2023-11-06 (×2): qty 1

## 2023-11-06 MED ORDER — SODIUM CHLORIDE 0.9% FLUSH
10.0000 mL | Freq: Two times a day (BID) | INTRAVENOUS | Status: DC
  Administered 2023-11-06 – 2023-11-10 (×8): 10 mL

## 2023-11-06 MED ORDER — ALBUMIN HUMAN 5 % IV SOLN
25.0000 g | Freq: Once | INTRAVENOUS | Status: AC
  Administered 2023-11-06: 25 g via INTRAVENOUS
  Filled 2023-11-06: qty 500

## 2023-11-06 MED ORDER — SODIUM CHLORIDE 0.9% FLUSH: 10.0000 mL | INTRAVENOUS | Status: DC | PRN

## 2023-11-06 MED ORDER — LACTULOSE 10 GM/15ML PO SOLN
30.0000 g | Freq: Three times a day (TID) | ORAL | Status: DC
  Administered 2023-11-06 – 2023-11-07 (×4): 30 g
  Filled 2023-11-06 (×4): qty 45

## 2023-11-06 MED ORDER — METOPROLOL TARTRATE 5 MG/5ML IV SOLN: 5.0000 mg | Freq: Four times a day (QID) | INTRAVENOUS | Status: DC | PRN

## 2023-11-06 NOTE — Progress Notes (Signed)
Peripherally Inserted Central Catheter Placement  The IV Nurse has discussed with the patient and/or persons authorized to consent for the patient, the purpose of this procedure and the potential benefits and risks involved with this procedure.  The benefits include less needle sticks, lab draws from the catheter, and the patient may be discharged home with the catheter. Risks include, but not limited to, infection, bleeding, blood clot (thrombus formation), and puncture of an artery; nerve damage and irregular heartbeat and possibility to perform a PICC exchange if needed/ordered by physician.  Alternatives to this procedure were also discussed.  Bard Power PICC patient education guide, fact sheet on infection prevention and patient information card has been provided to patient /or left at bedside. Telephone consent obtained from husband.   PICC Placement Documentation  PICC Triple Lumen 11/06/23 Left Brachial 44 cm 0 cm (Active)  Indication for Insertion or Continuance of Line Vasoactive infusions 11/06/23 1143  Exposed Catheter (cm) 0 cm 11/06/23 1143  Site Assessment Clean, Dry, Intact 11/06/23 1143  Lumen #1 Status Flushed;Saline locked;Blood return noted 11/06/23 1143  Lumen #2 Status Flushed;Saline locked;Blood return noted 11/06/23 1143  Lumen #3 Status Flushed;Saline locked;Blood return noted 11/06/23 1143  Dressing Type Transparent;Securing device 11/06/23 1143  Dressing Status Antimicrobial disc in place;Clean, Dry, Intact 11/06/23 1143  Line Care Connections checked and tightened 11/06/23 1143  Line Adjustment (NICU/IV Team Only) No 11/06/23 1143  Dressing Intervention New dressing;Adhesive placed at insertion site (IV team only);Adhesive placed around edges of dressing (IV team/ICU RN only) 11/06/23 1143  Dressing Change Due 11/13/23 11/06/23 1143       Elliot Dally 11/06/2023, 11:44 AM

## 2023-11-06 NOTE — Progress Notes (Signed)
Trauma/Critical Care Follow Up Note  Subjective:    Overnight Issues:   Objective:  Vital signs for last 24 hours: Temp:  [97.4 F (36.3 C)-101.5 F (38.6 C)] 101.4 F (38.6 C) (11/17 0800) Pulse Rate:  [70-115] 98 (11/17 0900) Resp:  [11-34] 16 (11/17 0900) BP: (62-182)/(35-113) 94/43 (11/17 0900) SpO2:  [89 %-100 %] 93 % (11/17 0900) FiO2 (%):  [40 %-50 %] 40 % (11/17 0734) Weight:  [106.2 kg] 106.2 kg (11/17 0500)  Hemodynamic parameters for last 24 hours:    Intake/Output from previous day: 11/16 0701 - 11/17 0700 In: 3545.5 [I.V.:731.1; NG/GT:1265; IV Piggyback:1549.4] Out: 1220 [Urine:1020; Stool:200]  Intake/Output this shift: Total I/O In: 241.8 [I.V.:76.8; NG/GT:165] Out: 175 [Urine:175]  Vent settings for last 24 hours: Vent Mode: PSV;CPAP FiO2 (%):  [40 %-50 %] 40 % Set Rate:  [18 bmp] 18 bmp Vt Set:  [490 mL] 490 mL PEEP:  [5 cmH20-8 cmH20] 8 cmH20 Pressure Support:  [10 cmH20] 10 cmH20 Plateau Pressure:  [14 cmH20] 14 cmH20  Physical Exam:  Gen: comfortable, no distress Neuro: not following commands HEENT: PERRL Neck: supple CV: RRR Pulm: unlabored breathing on mechanical ventilation-full support Abd: soft, NT    GU: urine clear and yellow, +Foley Extr: wwp, no edema  Results for orders placed or performed during the hospital encounter of 10/23/2023 (from the past 24 hour(s))  I-STAT 7, (LYTES, BLD GAS, ICA, H+H)     Status: Abnormal   Collection Time: 11/05/23 10:25 AM  Result Value Ref Range   pH, Arterial 7.351 7.35 - 7.45   pCO2 arterial 63.2 (H) 32 - 48 mmHg   pO2, Arterial 49 (L) 83 - 108 mmHg   Bicarbonate 34.9 (H) 20.0 - 28.0 mmol/L   TCO2 37 (H) 22 - 32 mmol/L   O2 Saturation 81 %   Acid-Base Excess 8.0 (H) 0.0 - 2.0 mmol/L   Sodium 142 135 - 145 mmol/L   Potassium 3.4 (L) 3.5 - 5.1 mmol/L   Calcium, Ion 1.27 1.15 - 1.40 mmol/L   HCT 32.0 (L) 36.0 - 46.0 %   Hemoglobin 10.9 (L) 12.0 - 15.0 g/dL   Patient temperature 86.5 F     Collection site RADIAL, ALLEN'S TEST ACCEPTABLE    Drawn by RT    Sample type ARTERIAL   Urinalysis, Routine w reflex microscopic -Urine, Clean Catch     Status: Abnormal   Collection Time: 11/05/23 10:42 AM  Result Value Ref Range   Color, Urine AMBER (A) YELLOW   APPearance CLEAR CLEAR   Specific Gravity, Urine 1.029 1.005 - 1.030   pH 5.0 5.0 - 8.0   Glucose, UA NEGATIVE NEGATIVE mg/dL   Hgb urine dipstick NEGATIVE NEGATIVE   Bilirubin Urine NEGATIVE NEGATIVE   Ketones, ur NEGATIVE NEGATIVE mg/dL   Protein, ur 30 (A) NEGATIVE mg/dL   Nitrite NEGATIVE NEGATIVE   Leukocytes,Ua NEGATIVE NEGATIVE   RBC / HPF 0-5 0 - 5 RBC/hpf   WBC, UA 0-5 0 - 5 WBC/hpf   Bacteria, UA NONE SEEN NONE SEEN   Squamous Epithelial / HPF 0-5 0 - 5 /HPF   Mucus PRESENT    Hyaline Casts, UA PRESENT   Glucose, capillary     Status: Abnormal   Collection Time: 11/05/23 11:13 AM  Result Value Ref Range   Glucose-Capillary 149 (H) 70 - 99 mg/dL  I-STAT 7, (LYTES, BLD GAS, ICA, H+H)     Status: Abnormal   Collection Time: 11/05/23 12:17 PM  Result Value Ref Range   pH, Arterial 7.338 (L) 7.35 - 7.45   pCO2 arterial 65.0 (H) 32 - 48 mmHg   pO2, Arterial 76 (L) 83 - 108 mmHg   Bicarbonate 34.9 (H) 20.0 - 28.0 mmol/L   TCO2 37 (H) 22 - 32 mmol/L   O2 Saturation 93 %   Acid-Base Excess 7.0 (H) 0.0 - 2.0 mmol/L   Sodium 142 135 - 145 mmol/L   Potassium 3.3 (L) 3.5 - 5.1 mmol/L   Calcium, Ion 1.26 1.15 - 1.40 mmol/L   HCT 30.0 (L) 36.0 - 46.0 %   Hemoglobin 10.2 (L) 12.0 - 15.0 g/dL   Patient temperature 16.1 F    Collection site RADIAL, ALLEN'S TEST ACCEPTABLE    Drawn by RT    Sample type ARTERIAL   Glucose, capillary     Status: Abnormal   Collection Time: 11/05/23  3:54 PM  Result Value Ref Range   Glucose-Capillary 170 (H) 70 - 99 mg/dL  Glucose, capillary     Status: Abnormal   Collection Time: 11/05/23  7:23 PM  Result Value Ref Range   Glucose-Capillary 136 (H) 70 - 99 mg/dL  Glucose,  capillary     Status: Abnormal   Collection Time: 11/06/23  3:35 AM  Result Value Ref Range   Glucose-Capillary 150 (H) 70 - 99 mg/dL  CBC     Status: Abnormal   Collection Time: 11/06/23  5:14 AM  Result Value Ref Range   WBC 13.3 (H) 4.0 - 10.5 K/uL   RBC 3.17 (L) 3.87 - 5.11 MIL/uL   Hemoglobin 10.2 (L) 12.0 - 15.0 g/dL   HCT 09.6 (L) 04.5 - 40.9 %   MCV 104.7 (H) 80.0 - 100.0 fL   MCH 32.2 26.0 - 34.0 pg   MCHC 30.7 30.0 - 36.0 g/dL   RDW 81.1 91.4 - 78.2 %   Platelets 267 150 - 400 K/uL   nRBC 0.0 0.0 - 0.2 %  Basic metabolic panel     Status: Abnormal   Collection Time: 11/06/23  5:14 AM  Result Value Ref Range   Sodium 143 135 - 145 mmol/L   Potassium 3.6 3.5 - 5.1 mmol/L   Chloride 104 98 - 111 mmol/L   CO2 30 22 - 32 mmol/L   Glucose, Bld 175 (H) 70 - 99 mg/dL   BUN 29 (H) 8 - 23 mg/dL   Creatinine, Ser 9.56 0.44 - 1.00 mg/dL   Calcium 9.0 8.9 - 21.3 mg/dL   GFR, Estimated >08 >65 mL/min   Anion gap 9 5 - 15  Glucose, capillary     Status: Abnormal   Collection Time: 11/06/23  8:13 AM  Result Value Ref Range   Glucose-Capillary 145 (H) 70 - 99 mg/dL    Assessment & Plan:  Present on Admission:  Closed unstable burst fracture of T5 vertebra (HCC)    LOS: 10 days   Additional comments:I reviewed the patient's new clinical lab test results.   and I reviewed the patients new imaging test results.    72F s/p fall   SAH, IVH - NSGY c/s, Dr. Jake Samples, repeat head CT reviewed, keppra x7d AF on eliquis - rec'd Andexxa, hold for now Subacute T5 burst fx - MRI completed, Being managed pre-admission by Dr. Lovell Sheehan. Brace at bedside. Okay to ambulate, monitor for neuro sx after ambulation. ETOH withdrawal - low dose phenobarb, now off, lethargy and decline in mentation resulted in intubation 11/15 PM HTN - on  home meds R 6th rib - pain control, pulm toilet  FEN- NPO, TF, cont lactulose for hyperammonemia, recheck NH4 in AM VTE- LMWH ID- send resp cx today Dispo-  ICU  Critical Care Total Time: 40 minutes  [name], MD Trauma & General Surgery Please use [email or web address] to contact on call provider  11/06/2023  *Care during the described time interval was provided by me. I have reviewed this patient's available data, including medical history, events of note, physical examination and test results as part of my evaluation.

## 2023-11-06 NOTE — Consult Note (Addendum)
[name] Consult Evaluation  Service Date: November 06, 2023 LOS:  LOS: 10 days    Primary Psychiatric Diagnoses  EtOH w/d delirium  2.  GAD, severe 3.  High risk for Wernicke/Korsakoff   Assessment  [name] is a 72 y.o. female admitted medically for 11/05/2023  3:31 PM after a fall. She carries the psychiatric diagnoses of anxiety, depression, and EtOH use d/o and has a past medical history of  afib, breast cancer, chronic pain, hypothyroidism, spinal stenosis, vit D deficiency. Psychiatry was consulted for anxiety, coping  by Dr. Bedelia Person.  In a prior hospitalization - at that point pt went into severe EtOH w/d, began confabulating, and ultimately required IV thiamine. Discussed this with Dr. Bedelia Person shortly after consult placed.   At initial consult, the patient's presentation is most consistent with  delirium, most likely due to multiple etiologies including but not limited to alcohol withdrawal infection, medications, pain, altered sleep/wake cycle, and limited mobility. The patient would strongly benefit from medical treatment of alcohol withdrawal, as well as further investigation for etiologies of delirium. Full psychiatric assessment was not able to be completed as pt was not able to engage in a coherent interview - by history (and multiple family member's accounts) anxiety has driven her drinking behavior. Debated phenobarb vs ativan for EtOH w/d (having sx despite being on alpha agonist which can help w/ milder cases) - ultimately went with ativan d/t older age, medical frailty, and most importantly shorter half life (easier to stop/hold if it worsens delirium).  Plan d/w daughter and husband. Pt unable to engage in discussion of r/b/se.  During this time period, minimization of deliriogenic insults will be of utmost importance; this includes promoting the normal circadian cycle, minimizing lines/tubes, avoiding deliriogenic medications such as benzodiazepines and  anticholinergic medications, and frequently reorienting the patient. Symptomatic treatment for agitation can be provided by antipsychotic medications, though it is important to remember that these do not treat the underlying etiology of delirium. Notably, there can be a time lag effect between treatment of a medical problem and resolution of delirium. This time lag effect may be of longer duration in the elderly, and those with underlying cognitive impairment or brain injury.  11/03/2023 Patient seen this morning.  She is in bed wearing BiPAP.  She does not open her eyes to verbal command or touch.  Her daughter, [name] and husband, [name] are at bedside.  They state that patient has not been communicating with them, but appears restless and agitated.  Husband states that in going through the house, he found 35 small bottles of wine.  He does not know the time frame in which patient may have been drinking these, but believes that she increased her drinking approximately 8 weeks ago following a back injury.  Husband reports that patient uses alcohol and pain meds to "self-medicate".  He notes that he has not been sharing a bedroom with his wife over the past year due to her noisy breathing and grunting sounds at night.  He states that she was never diagnosed with an obstructive sleep apnea. In reviewing medications, husband recalls that patient previously did well with Prozac when she was first diagnosed with depression.  He believes that buspirone has been helpful for her anxiety.  He has not noted any significant difference with Cymbalta or Zoloft in treating her mood disorder.  Nursing is at bedside and note that patient becomes extremely agitated every 4 hours as her Ativan wears off.  She will pull at her lines and become very restless in the bed.   11/15: Patient seen, in bed.  She is less restless overall, but increased work of breathing off of her BiPAP.  Family is not at bedside today.  Patient makes  minimal attempt to open eyes, but overhead light is on bright.  Less grunting sounds.  She does not follow commands.  Review of medical administration record shows that time between Ativan administration has been able to be extended to 5-6 hours.  11/05/23: Patient seen in her hospital room. She is intubated, heavily sedated, alert and oriented x 0, hence, could not participate in psychiatric evaluation. Family is not at bedside.This Clinical research associate communicated with the nurse taking care of patient who reports that patient was intubated earlier today due to worsening mental status. She denies any aggressive or agitated behavior so far.   11/06/23: Patient seen in her hospital room. This writer was unable to communicate with the patient as she is intubated and sedated. However, collateral information from the nurse taking care of the patient indicates that patient gets agitated occasionally but nothing out of control. She had central catheter placement today with no concerns.    Diagnoses:  Active Hospital problems: Principal Problem:   Closed unstable burst fracture of T5 vertebra (HCC) Active Problems:   Agitation states as acute reaction to exceptional (gross) stress  Alcohol withdrawal   Plan   ## Psychiatric Medication Recommendations:  -- Continue buspirone 15 TID - home regimen  -- Continue trazodone 50 mg at bedtime (decreased by primary team) -- Continue fluoxetine 20 mg daily via tube for depression -- Continue Haldol 2 mg per tube 3 times daily to decrease agitation, and assess for decreased need of benzodiazepines -- Continue high dose IV thiamine protocol (500 TID x 9 doses, then oral) -- Continue CIWA + sx triggered ativan - likely start taper when we get a good idea of daily requirements   --Psychiatric consult service will sign off of this patient as she is unable to participate in evaluation.  Please re-consult as needed.   Good gabapentin candidate- this has not been  started  On belbuca 450 at home, getting full agonist opioids currently.   -- Sertraline 50 mg, Cymbalta 60 BID, quetiapine 25 at bedtime, had been discontinued prior to this encounter  ## Medical Decision Making Capacity:  Clearly lacked for most major decisions.  Husband and daughter are at bedside as surrogate decision makers  ## Further Work-up:  -- B1, folate, B12-acceptable range -- Pertinent labwork reviewed earlier this admission includes: MCV 108  -- most recent EKG on 11/07 had QtC of 479 -- Repeat ECG 11/04/2023 was not finalized for review.  ## Disposition:  -- There are no psychiatric contraindications to discharge at this time --Psychiatric consult service will sign off of this patient as she is unable to participate in evaluation.  Please re-consult when able to participate.   ## Behavioral / Environmental:  --  Delirium Precautions: Delirium Interventions for Nursing and Staff: - RN to open blinds every AM. - To Bedside: Glasses, hearing aide, and pt's own shoes. Make available to patients. when possible and encourage use. - Encourage po fluids when appropriate, keep fluids within reach. - OOB to chair with meals. - Passive ROM exercises to all extremities with AM & PM care. - RN to assess orientation to person, time and place QAM and PRN. - Recommend extended visitation hours with familiar family/friends as feasible. - Staff to  minimize disturbances at night. Turn off television when pt asleep or when not in use.    ## Safety and Observation Level:  - Based on my clinical evaluation, I estimate the patient to be at low risk of self harm in the current setting mostly due to decreased safety awareness   - At this time, we recommend a routine level of observation. This decision is based on my review of the chart including patient's history and current presentation, interview of the patient, mental status examination, and consideration of suicide risk including  evaluating suicidal ideation, plan, intent, suicidal or self-harm behaviors, risk factors, and protective factors. This judgment is based on our ability to directly address suicide risk, implement suicide prevention strategies and develop a safety plan while the patient is in the clinical setting. Please contact our team if there is a concern that risk level has changed.  Suicide risk assessment  Patient has following modifiable risk factors for suicide: severe anxiety, EtOH use d/o which we are addressing by addressing meds as appropriate, providing outpt resources closer to dc.   Patient has following non-modifiable or demographic risk factors for suicide: N/A  Patient has the following protective factors against suicide: Supportive family and no history of suicide attempts   Thank you for this consult request. Recommendations have been communicated to the primary team.   Fredonia Highland, MD  Psychiatric and Social History   Relevant Aspects of Hospital Course:  Admitted on 11/14/2023 for a T5 fracture following a fall. They have been progressively more anxious/confused since. Notably, at time of this note was operating under assumption pt did not want daughter informed of EtOH use.   Patient Report:  Pt seen in AM.  Less agitated and restless in the bed, BiPAP off with labored breathing and making grunting sounds. Appears to be tolerating medication changes.   10/31/2023 Spoke to daughter - pt has been confused, anxious, difficult to redirect. Discussed pt's transient SI statement - will notify staff. Did not discuss EtOH use (at the time, was under impression daughter was not to be informed about EtOH use) Called husband - now OK to loop daughter into EtOH use. Talked through pt's long history of anxiety (through life) and EtOH use (which emerged later in life). Started drinking more when daughter went to college. Anxiety got worse w/ health issues, started self medicating with wine.  Thinks most recent falls d/t EtOH but unsure. Never went to EtOH rehab, did benefit from AA - was sober for a year up until around Jan of this  year. Had large increase in use over past 4 weeks - husband not sure how much but many wine bottles found in room. He is guessing about 1 bottle of wine a day on top of pain pills, muscle relaxers. Was eating very little.    Psych ROS:  Pt unable to participate in meaningful ROS d/t AMS.   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, Gaffer, etc Occupational Hx: unknown Living Situation: With  husband Access to weapons: Deferred  Substance History Tobacco use: no Alcohol use: see HPI Drug use: no   Exam Findings   Psychiatric Specialty Exam:  Presentation  General Appearance: Disheveled  Eye Contact:None  Speech:-- (grunting)  Speech Volume:Normal  Handedness:Right   Mood and Affect  Mood:-- (UTA)  Affect:-- (agitated)   Thought Process  Thought Processes:-- (UTA)  Descriptions of Associations:-- (UTA)  Orientation:-- (UTA)  Thought Content:-- (UTA)  Hallucinations:No data recorded   Ideas of Reference:-- (UTA)  Suicidal Thoughts:No data recorded   Homicidal Thoughts:No data recorded    Sensorium  Memory:-- (UTA)  Judgment:-- (UTA)  Insight:-- (UTA)   Executive Functions  Concentration:-- (UTA)  Attention Span:-- (UTA)  Recall:-- (UTA)  Fund of Knowledge:--  (UTA)  Language:-- (UTA)   Psychomotor Activity  Psychomotor Activity:No data recorded    Assets  Assets:Social Support; Housing   Sleep  Sleep:No data recorded     Physical Exam: Vital signs:  Temp:  [97.4 F (36.3 C)-101.5 F (38.6 C)] 97.5 F (36.4 C) (11/17 1200) Pulse Rate:  [70-115] 80 (11/17 1345) Resp:  [11-27] 12 (11/17 1345) BP: (87-182)/(41-104) 109/50 (11/17 1345) SpO2:  [90 %-100 %] 95 % (11/17 1345) FiO2 (%):  [40 %-50 %] 40 % (11/17 1258) Weight:  [106.2 kg] 106.2 kg (11/17 0500) Physical Exam Constitutional:      Appearance: She is obese.  HENT:     Head: Normocephalic.  Pulmonary:     Comments: Wearing BiPAP  Musculoskeletal:     Comments: Able to move upper and lower extremities.  Wearing mitts to avoid pulling at tubes.  Neurological:     Mental Status: She is alert. She is disoriented.     Blood pressure (!) 109/50, pulse 80, temperature (!) 97.5 F (36.4 C), temperature source Axillary, resp. rate 12, height 5\' 7"  (1.702 m), weight 106.2 kg, SpO2 95%. Body mass index is 36.67 kg/m.   Other History   These have been pulled in through the EMR, reviewed, and updated if appropriate.   Family History:  The patient's family history includes Alcoholism in her father; Anxiety disorder in her mother; Dementia in her mother; Depression in her mother; Diabetes in her father and sister; Hypertension in her sister; Stroke in her father; Thyroid disease in her mother.  Medical History: Past Medical History:  Diagnosis Date   Alcohol abuse    Anxiety    Atrial fibrillation [facility])    sees Dr. Flora Lipps   Bilateral swelling of feet    Cancer [facility])    breast   Chronic diarrhea    Chronic pain    Concussion 02/2007   ICU x 3 days   Depression    Hypertension    Hypothyroidism    Personal history of chemotherapy    Personal history of radiation therapy    Spinal stenosis    Thyroid disease ?1994   Vitamin D deficiency     Surgical  History: Past Surgical History:  Procedure Laterality Date   BREAST BIOPSY Right 12/20/2019   x2   BREAST LUMPECTOMY Right 01/22/2020   BREAST LUMPECTOMY WITH RADIOACTIVE SEED AND SENTINEL LYMPH NODE BIOPSY Right 01/22/2020   Procedure: RIGHT BREAST LUMPECTOMY WITH RADIOACTIVE SEED X2 AND RIGHT SENTINEL LYMPH NODE MAPPING;  Surgeon: Harriette Bouillon, MD;  Location: [facility];  Service: General;  Laterality: Right;   BREAST SURGERY Right 03/1998   breast biopsy, benign   EYE SURGERY Right    PORT-A-CATH REMOVAL Right 04/30/2021  Procedure: REMOVAL PORT-A-CATH;  Surgeon: Harriette Bouillon, MD;  Location: [facility];  Service: General;  Laterality: Right;   PORTACATH PLACEMENT Right 01/22/2020   Procedure: INSERTION PORT-A-CATH WITH ULTRASOUND;  Surgeon: Harriette Bouillon, MD;  Location: [facility];  Service: General;  Laterality: Right;   PORTACATH PLACEMENT Right 02/28/2020   Procedure: PORT A CATH REVISION;  Surgeon: Harriette Bouillon, MD;  Location: [facility];  Service: General;  Laterality: Right;   REVERSE SHOULDER ARTHROPLASTY Left 02/02/2021   Procedure: LEFT REVERSE SHOULDER ARTHROPLASTY;  Surgeon: Cammy Copa, MD;  Location: [facility] OR;  Service: Orthopedics;  Laterality: Left;   SHOULDER SURGERY Right    SPINAL FUSION  03/04/2011   with ORIF    Medications:   Current Facility-Administered Medications:    acetaminophen (TYLENOL) tablet 1,000 mg, 1,000 mg, Per Tube, Q6H, [name], MD, 1,000 mg at 11/06/23 1245   busPIRone (BUSPAR) tablet 15 mg, 15 mg, Per Tube, TID, [name], MD, 15 mg at 11/06/23 1028   carvedilol (COREG) tablet 25 mg, 25 mg, Per Tube, BID WC, Lysle Rubens, MD, 25 mg at 11/06/23 9528   Chlorhexidine Gluconate Cloth 2 % PADS 6 each, 6 each, Topical, Daily, Kinsinger, De Blanch, MD, 6 each at 11/05/23 2130   enoxaparin (LOVENOX) injection 40 mg, 40 mg, Subcutaneous, Q12H, Lovick, Lennie Odor, MD, 40 mg at  11/06/23 1029   feeding supplement (PIVOT 1.5 CAL) liquid 1,000 mL, 1,000 mL, Per Tube, Continuous, [name], MD, Last Rate: 55 mL/hr at 11/06/23 1300, Infusion Verify at 11/06/23 1300   fentaNYL (SUBLIMAZE) bolus via infusion 25 mcg, 25 mcg, Intravenous, Q30 min PRN, Lysle Rubens, MD   fentaNYL in NS (55mcg/ml) infusion-PREMIX, 0-200 mcg/hr, Intravenous, Continuous, Lysle Rubens, MD, Last Rate: 10 mL/hr at 11/06/23 1300, 100 mcg/hr at 11/06/23 1300   FLUoxetine (PROZAC) capsule 20 mg, 20 mg, Per Tube, Daily, Mariel Craft, MD, 20 mg at 11/06/23 1028   folic acid (FOLVITE) tablet 1 mg, 1 mg, Per Tube, Daily, [name], MD, 1 mg at 11/06/23 1028   haloperidol (HALDOL) 2 MG/ML solution 5 mg, 5 mg, Per Tube, TID, [name], MD, 5 mg at 11/06/23 1029   haloperidol lactate (HALDOL) injection 10 mg, 10 mg, Intravenous, Q6H PRN, [name], MD, 10 mg at 11/04/23 0242   hydrALAZINE (APRESOLINE) injection 10 mg, 10 mg, Intravenous, Q4H PRN, [name], MD, 10 mg at 11/04/23 2113   hydrALAZINE (APRESOLINE) tablet 25 mg, 25 mg, Per Tube, Q8H, Lovick, Lennie Odor, MD, 25 mg at 11/06/23 1343   lactulose (CHRONULAC) 10 GM/15ML solution 30 g, 30 g, Per Tube, TID, [name], MD, 30 g at 11/06/23 1049   levothyroxine (SYNTHROID) tablet 150 mcg, 150 mcg, Per Tube, Q0600, [name], MD, 150 mcg at 11/06/23 0523   losartan (COZAAR) tablet 100 mg, 100 mg, Per Tube, QHS, Lovick, Lennie Odor, MD   methocarbamol (ROBAXIN) injection 1,000 mg, 1,000 mg, Intravenous, Q8H, [name], MD, 1,000 mg at 11/06/23 1340   metoprolol tartrate (LOPRESSOR) injection 5 mg, 5 mg, Intravenous, Q6H PRN, [name], MD   multivitamin with minerals tablet 1 tablet, 1 tablet, Per Tube, Daily, [name], MD, 1 tablet at 11/06/23 1028   naloxone [facility]) injection 0.4 mg, 0.4 mg, Intravenous, PRN, Andria Meuse, MD, 0.4 mg at 11/02/23 1505   ondansetron  (ZOFRAN-ODT) disintegrating tablet 4 mg, 4 mg, Oral, Q6H PRN **OR** ondansetron (ZOFRAN) injection 4 mg, 4 mg,  Intravenous, Q6H PRN, Kinsinger, De Blanch, MD, 4 mg at 10/29/23 1205   Oral care mouth rinse, 15 mL, Mouth Rinse, Q2H, Lysle Rubens, MD, 15 mL at 11/06/23 1346   Oral care mouth rinse, 15 mL, Mouth Rinse, PRN, Lysle Rubens, MD   oxyCODONE (Oxy IR/ROXICODONE) immediate release tablet 5-10 mg, 5-10 mg, Per Tube, Q4H PRN, [name], MD, 10 mg at 11/06/23 0839   polyethylene glycol (MIRALAX / GLYCOLAX) packet 17 g, 17 g, Per Tube, Daily PRN, [name], MD   polyethylene glycol (MIRALAX / GLYCOLAX) packet 17 g, 17 g, Per Tube, Daily, [name], MD, 17 g at 11/02/23 1052   propofol (DIPRIVAN) 1000 MG/100ML infusion, 5-80 mcg/kg/min, Intravenous, Titrated, Lysle Rubens, MD, Last Rate: 6.27 mL/hr at 11/06/23 1300, 10 mcg/kg/min at 11/06/23 1300   propofol (DIPRIVAN) bolus via infusion 10-20 mg, 10-20 mg, Intravenous, Q1H PRN, Lysle Rubens, MD   sodium chloride (OCEAN) 0.65 % nasal spray 1 spray, 1 spray, Each Nare, PRN, [name], MD, 1 spray at 10/30/23 1156   sodium chloride flush (NS) 0.9 % injection 10-40 mL, 10-40 mL, Intracatheter, Q12H, Tressie Stalker, MD, 10 mL at 11/06/23 1245   sodium chloride flush (NS) 0.9 % injection 10-40 mL, 10-40 mL, Intracatheter, PRN, Tressie Stalker, MD   traZODone (DESYREL) tablet 100 mg, 100 mg, Per Berenice Primas, MD, 100 mg at 11/05/23 2125  Allergies: Allergies  Allergen Reactions   Zolpidem Tartrate Other (See Comments)    Hallucinations and Confusion

## 2023-11-06 NOTE — Progress Notes (Signed)
   Palliative Medicine Inpatient Follow Up Note   HPI: 72 yo female was walking and lost balance and fell backward. She complains of intense pain in her back. She takes eliquis for atrial fibrillation.    [name] has been asked to get involved to further support goals of care conversations.   Today's Discussion 11/06/2023  *Please note that this is a verbal dictation therefore any spelling or grammatical errors are due to the "[employer] One" system interpretation.  Chart reviewed inclusive of vital signs, progress notes, laboratory results, and diagnostic images.   I met with patients spouse, [name] and daughter, [name]. We discussed patients clinical state over the past few days. We discussed patients intubation in the setting of her AMS. We discussed again consideration leading to patients presentation inclusive of ETOH withdrawal & SAH. We discussed again Wernicke/Korsakoff as a consideration.   We reviewed patients possible best case and worst case scenarios.   I spoke with patients family about concerns related to extubation. We reviewed that family does not believe the patient would want a tracheostomy. We talked about if and when safe to extubate the importance of knowing if patient would desire re-intubation. We discussed the reason for knowing this in the setting of patients ongoing tenuous state. I shared openly and honestly that patient could very well end up declining (again) and if they do not believe she would want re-intubation considering the possibility of comfort care at that time.   Patients family are just coming to terms with the severity of her ETOH abuse. These decisions are reasonably causing them difficulty. Emotional support provided through therapeutic communication.   Questions and concerns addressed/Palliative Support Provided.   Objective Assessment: Vital Signs Vitals:   11/06/23 1445 11/06/23 1500  BP: (!) 98/48 (!) 105/46  Pulse: 76 72  Resp: 12  13  Temp:    SpO2: 95% 96%    Intake/Output Summary (Last 24 hours) at 11/06/2023 1517 Last data filed at 11/06/2023 1500 Gross per 24 hour  Intake 3494.13 ml  Output 1378 ml  Net 2116.13 ml   Last Weight  Most recent update: 11/06/2023  5:25 AM    Weight  106.2 kg (234 lb 2.1 oz)            Gen:  Elderly Caucasian F chronically ill appearing HEENT:  coretrack, ETT, dry mucous membranes CV: Regular rate and irregular rhythm PULM: ON mechanical ventilator ABD: soft/nontender  EXT: (+) edema  Neuro: Somnolent  SUMMARY OF RECOMMENDATIONS   DNAR --> Patients family share the would not want patient to have a tracheostomy  Discussed considerations if patient gets extubated --> the possibility of further decline; the question of would patient desire reintubation  Appreciate the Psychiatry team speaking more to patients family about Wernicke encephalopathy  Ongoing PMT support    Time Spent: 52 Billing based on MDM: High ______________________________________________________________________________________ Lamarr Lulas [place] Palliative Medicine Team Team Cell Phone: [phone] Please utilize secure chat with additional questions, if there is no response within 30 minutes please call the above phone number  Palliative Medicine Team providers are available by phone from 7am to 7pm daily and can be reached through the team cell phone.  Should this patient require assistance outside of these hours, please call the patient's attending physician.

## 2023-11-07 DIAGNOSIS — Z7189 Other specified counseling: Secondary | ICD-10-CM | POA: Diagnosis not present

## 2023-11-07 DIAGNOSIS — Z515 Encounter for palliative care: Secondary | ICD-10-CM | POA: Diagnosis not present

## 2023-11-07 LAB — CBC
HCT: 28.2 % — ABNORMAL LOW (ref 36.0–46.0)
Hemoglobin: 8.4 g/dL — ABNORMAL LOW (ref 12.0–15.0)
MCH: 32.9 pg (ref 26.0–34.0)
MCHC: 29.8 g/dL — ABNORMAL LOW (ref 30.0–36.0)
MCV: 110.6 fL — ABNORMAL HIGH (ref 80.0–100.0)
Platelets: 224 10*3/uL (ref 150–400)
RBC: 2.55 MIL/uL — ABNORMAL LOW (ref 3.87–5.11)
RDW: 13.6 % (ref 11.5–15.5)
WBC: 11.5 10*3/uL — ABNORMAL HIGH (ref 4.0–10.5)
nRBC: 0 % (ref 0.0–0.2)

## 2023-11-07 LAB — GLUCOSE, CAPILLARY
Glucose-Capillary: 123 mg/dL — ABNORMAL HIGH (ref 70–99)
Glucose-Capillary: 124 mg/dL — ABNORMAL HIGH (ref 70–99)
Glucose-Capillary: 145 mg/dL — ABNORMAL HIGH (ref 70–99)
Glucose-Capillary: 150 mg/dL — ABNORMAL HIGH (ref 70–99)
Glucose-Capillary: 155 mg/dL — ABNORMAL HIGH (ref 70–99)
Glucose-Capillary: 166 mg/dL — ABNORMAL HIGH (ref 70–99)
Glucose-Capillary: 168 mg/dL — ABNORMAL HIGH (ref 70–99)

## 2023-11-07 LAB — BASIC METABOLIC PANEL
Anion gap: 8 (ref 5–15)
BUN: 34 mg/dL — ABNORMAL HIGH (ref 8–23)
CO2: 30 mmol/L (ref 22–32)
Calcium: 8.7 mg/dL — ABNORMAL LOW (ref 8.9–10.3)
Chloride: 104 mmol/L (ref 98–111)
Creatinine, Ser: 0.62 mg/dL (ref 0.44–1.00)
GFR, Estimated: >60 mL/min (ref >60)
Glucose, Bld: 153 mg/dL — ABNORMAL HIGH (ref 70–99)
Potassium: 3.2 mmol/L — ABNORMAL LOW (ref 3.5–5.1)
Sodium: 142 mmol/L (ref 135–145)

## 2023-11-07 LAB — AMMONIA: Ammonia: 19 umol/L (ref 9–35)

## 2023-11-07 MED ORDER — NOREPINEPHRINE 4 MG/250ML-% IV SOLN
0.0000 ug/min | INTRAVENOUS | Status: DC
  Administered 2023-11-07: 11 ug/min via INTRAVENOUS
  Administered 2023-11-07: 7 ug/min via INTRAVENOUS
  Administered 2023-11-08: 15 ug/min via INTRAVENOUS
  Administered 2023-11-08: 10 ug/min via INTRAVENOUS
  Administered 2023-11-08: 5 ug/min via INTRAVENOUS
  Filled 2023-11-07 (×2): qty 250
  Filled 2023-11-07: qty 500
  Filled 2023-11-07: qty 250

## 2023-11-07 MED ORDER — FUROSEMIDE 10 MG/ML IJ SOLN
20.0000 mg | Freq: Once | INTRAMUSCULAR | Status: AC
  Administered 2023-11-07: 20 mg via INTRAVENOUS
  Filled 2023-11-07: qty 2

## 2023-11-07 MED ORDER — SODIUM CHLORIDE 0.9 % IV SOLN: 250.0000 mL | INTRAVENOUS | Status: AC

## 2023-11-07 MED ORDER — NOREPINEPHRINE 4 MG/250ML-% IV SOLN
2.0000 ug/min | INTRAVENOUS | Status: DC
Start: 2023-11-07 — End: 2023-11-07
  Administered 2023-11-07: 2 ug/min via INTRAVENOUS
  Filled 2023-11-07: qty 250

## 2023-11-07 MED ORDER — POTASSIUM CHLORIDE 20 MEQ PO PACK
40.0000 meq | PACK | ORAL | Status: AC
Start: 2023-11-07 — End: 2023-11-07
  Administered 2023-11-07 (×2): 40 meq
  Filled 2023-11-07 (×2): qty 2

## 2023-11-07 MED ORDER — LACTULOSE 10 GM/15ML PO SOLN
20.0000 g | Freq: Every day | ORAL | Status: DC
  Administered 2023-11-08: 20 g
  Filled 2023-11-07: qty 30

## 2023-11-07 NOTE — Progress Notes (Signed)
SLP Cancellation Note  Patient Details Name: [name] MRN: [id number] DOB: Apr 22, 1951   Cancelled treatment:       Reason Eval/Treat Not Completed: Medical issues which prohibited therapy (on vent). Will continue to follow.     Mahala Menghini., M.A. CCC-SLP Acute Rehabilitation Services Office [phone]  Secure chat preferred  11/07/2023, 7:55 AM

## 2023-11-07 NOTE — Progress Notes (Signed)
PT Cancellation Note  Patient Details Name: [name] MRN: [id number] DOB: 05/10/1951   Cancelled Treatment:    Reason Eval/Treat Not Completed: Medical issues which prohibited therapy;Patient not medically ready (Discussed with RN, pt on vent and sedated. Will follow up at later date/time as schedule allows and pt able.)   Renaldo Fiddler PT, DPT Acute Rehabilitation Services Office [phone]  11/07/23 10:20 AM

## 2023-11-07 NOTE — Progress Notes (Signed)
RT NOTE: patient placed on CPAP/PSV of 12/5 at [place].  Currently tolerating well.  Will continue to monitor and wean as tolerated.

## 2023-11-07 NOTE — Progress Notes (Signed)
   Palliative Medicine Inpatient Follow Up Note HPI: 72 yo female was walking and lost balance and fell backward. She complains of intense pain in her back. She takes eliquis for atrial fibrillation.    [name] has been asked to get involved to further support goals of care conversations.   Today's Discussion 11/07/2023  *Please note that this is a verbal dictation therefore any spelling or grammatical errors are due to the "[employer]" system interpretation.  Chart reviewed inclusive of vital signs, progress notes, laboratory results, and diagnostic images.   Per patients RN, [name] she has been on bipap settings throughout the day and tolerated this well. She shares patient does open her eyes but is not meaningfully following her commands.She was in pain earlier and the only medication that seemed to alleviate this was oxycodone.   I met with [name] at bedside this late afternoon. She is able to open her eyes though not able to follow directions for myself either. She does move around intermittently though it is not purposeful.   I called and spoke with patients spouse, [name]. He feels encouraged that ventilation "needs" appear to be decreasing. He shares that he cannot say not to re-intubate her as he at this point would be open to another intubation if necessary.  He expresses wanting to give [name] "every shot" within reason to see if she can turn the corner. He shares that this has been exceptionally hard on him. I offered emotional support through therapeutic listening.  Patients sister plans to come tomorrow which [name] is hopeful will help him as it is like his own sister.  Questions and concerns addressed/Palliative Support Provided.   Objective Assessment: Vital Signs Vitals:   11/07/23 1530 11/07/23 1600  BP: 123/60 (!) 131/54  Pulse: 91 90  Resp: 19 17  Temp:    SpO2: 100% 100%    Intake/Output Summary (Last 24 hours) at 11/07/2023 1614 Last data filed at  11/07/2023 1611 Gross per 24 hour  Intake 2080.87 ml  Output 2222 ml  Net -141.13 ml   Last Weight  Most recent update: 11/07/2023  5:44 AM    Weight  106.5 kg (234 lb 12.6 oz)            Gen:  Elderly Caucasian F chronically ill appearing HEENT:  coretrack, ETT, dry mucous membranes CV: Regular rate and irregular rhythm PULM: ON mechanical ventilator ABD: soft/nontender  EXT: (+) edema  Neuro: Somnolent  SUMMARY OF RECOMMENDATIONS   DNAR --> Patients family share the would not want patient to have a tracheostomy  Discussed considerations if patient gets extubated -->as of today patients spouse would desire one more intubation  Allowing time for outcomes  Ongoing PMT support    Time Spent:  38 ______________________________________________________________________________________ [name] Palliative Medicine Team Team Cell Phone: [phone] Please utilize secure chat with additional questions, if there is no response within 30 minutes please call the above phone number  Palliative Medicine Team providers are available by phone from 7am to 7pm daily and can be reached through the team cell phone.  Should this patient require assistance outside of these hours, please call the patient's attending physician.

## 2023-11-07 NOTE — Progress Notes (Signed)
OT Cancellation Note  Patient Details Name: [name] MRN: [id number] DOB: 03/10/1951   Cancelled Treatment:    Reason Eval/Treat Not Completed: Medical issues which prohibited therapy (Discussed with RN, pt on vent and sedated. Will follow up at later date/time as schedule allows and pt medically stable.)  [name] [name] [name] MSOT, OTR/L Acute Rehab Office: [phone] 11/07/2023, 10:51 AM

## 2023-11-07 NOTE — Progress Notes (Signed)
Trauma/Critical Care Follow Up Note  Subjective:    Overnight Issues:   Objective:  Vital signs for last 24 hours: Temp:  [98.6 F (37 C)-101.1 F (38.4 C)] 98.7 F (37.1 C) (11/18 1144) Pulse Rate:  [72-116] 82 (11/18 1200) Resp:  [11-28] 14 (11/18 1200) BP: (81-169)/(32-81) 101/45 (11/18 1200) SpO2:  [90 %-100 %] 100 % (11/18 1200) FiO2 (%):  [40 %-50 %] 40 % (11/18 1145) Weight:  [106.5 kg] 106.5 kg (11/18 0500)  Hemodynamic parameters for last 24 hours:    Intake/Output from previous day: 11/17 0701 - 11/18 0700 In: 2085.3 [I.V.:428.6; NG/GT:1375; IV Piggyback:281.7] Out: 1972 [Urine:1172; Stool:800]  Intake/Output this shift: Total I/O In: 220.7 [I.V.:55.7; NG/GT:165] Out: 250 [Urine:250]  Vent settings for last 24 hours: Vent Mode: PSV;CPAP FiO2 (%):  [40 %-50 %] 40 % Set Rate:  [18 bmp] 18 bmp Vt Set:  [490 mL] 490 mL PEEP:  [5 cmH20-8 cmH20] 5 cmH20 Pressure Support:  [10 cmH20-12 cmH20] 12 cmH20 Plateau Pressure:  [11 cmH20-13 cmH20] 11 cmH20  Physical Exam:  Gen: comfortable, no distress Neuro: not following commands HEENT: PERRL Neck: supple CV: RRR Pulm: unlabored breathing on mechanical ventilation-pressure support Abd: soft, NT    GU: urine clear and yellow, +Foley Extr: wwp, no edema  Results for orders placed or performed during the hospital encounter of 11/17/2023 (from the past 24 hour(s))  Glucose, capillary     Status: Abnormal   Collection Time: 11/06/23  3:29 PM  Result Value Ref Range   Glucose-Capillary 149 (H) 70 - 99 mg/dL  Glucose, capillary     Status: Abnormal   Collection Time: 11/06/23  8:18 PM  Result Value Ref Range   Glucose-Capillary 165 (H) 70 - 99 mg/dL  Glucose, capillary     Status: Abnormal   Collection Time: 11/07/23 12:05 AM  Result Value Ref Range   Glucose-Capillary 123 (H) 70 - 99 mg/dL  Glucose, capillary     Status: Abnormal   Collection Time: 11/07/23  3:59 AM  Result Value Ref Range    Glucose-Capillary 124 (H) 70 - 99 mg/dL  Ammonia     Status: None   Collection Time: 11/07/23  5:10 AM  Result Value Ref Range   Ammonia 19 9 - 35 umol/L  CBC     Status: Abnormal   Collection Time: 11/07/23  5:15 AM  Result Value Ref Range   WBC 11.5 (H) 4.0 - 10.5 K/uL   RBC 2.55 (L) 3.87 - 5.11 MIL/uL   Hemoglobin 8.4 (L) 12.0 - 15.0 g/dL   HCT 40.9 (L) 81.1 - 91.4 %   MCV 110.6 (H) 80.0 - 100.0 fL   MCH 32.9 26.0 - 34.0 pg   MCHC 29.8 (L) 30.0 - 36.0 g/dL   RDW 78.2 95.6 - 21.3 %   Platelets 224 150 - 400 K/uL   nRBC 0.0 0.0 - 0.2 %  Basic metabolic panel     Status: Abnormal   Collection Time: 11/07/23  5:15 AM  Result Value Ref Range   Sodium 142 135 - 145 mmol/L   Potassium 3.2 (L) 3.5 - 5.1 mmol/L   Chloride 104 98 - 111 mmol/L   CO2 30 22 - 32 mmol/L   Glucose, Bld 153 (H) 70 - 99 mg/dL   BUN 34 (H) 8 - 23 mg/dL   Creatinine, Ser 0.86 0.44 - 1.00 mg/dL   Calcium 8.7 (L) 8.9 - 10.3 mg/dL   GFR, Estimated >57 >84 mL/min  Anion gap 8 5 - 15  Glucose, capillary     Status: Abnormal   Collection Time: 11/07/23  7:49 AM  Result Value Ref Range   Glucose-Capillary 145 (H) 70 - 99 mg/dL  Glucose, capillary     Status: Abnormal   Collection Time: 11/07/23 11:13 AM  Result Value Ref Range   Glucose-Capillary 166 (H) 70 - 99 mg/dL    Assessment & Plan: The plan of care was discussed with the bedside nurse for the day, Rayfield Citizen, who is in agreement with this plan and no additional concerns were raised.   Present on Admission:  Closed unstable burst fracture of T5 vertebra (HCC)    LOS: 11 days   Additional comments:I reviewed the patient's new clinical lab test results.   and I reviewed the patients new imaging test results.    66F s/p fall   SAH, IVH - NSGY c/s, Dr. Jake Samples, repeat head CT reviewed, keppra x7d AF on eliquis - rec'd Andexxa, hold for now Subacute T5 burst fx - MRI completed, Being managed pre-admission by Dr. Lovell Sheehan. Brace at bedside. Okay to  ambulate, monitor for neuro sx after ambulation. ETOH withdrawal - low dose phenobarb, now off, lethargy and decline in mentation resulted in intubation 11/15 PM HTN - on home meds R 6th rib - pain control, pulm toilet  FEN- NPO, TF, decr lactulose for hyperammonemia, 19 this AM, replete hypokalemia VTE- LMWH ID- resp cx P, rare GPCs, WBC downtrending and Tmax 101.1 Dispo- ICU  Critical Care Total Time: 40 minutes  [name], MD Trauma & General Surgery Please use [email or web address] to contact on call provider  11/07/2023  *Care during the described time interval was provided by me. I have reviewed this patient's available data, including medical history, events of note, physical examination and test results as part of my evaluation.

## 2023-11-07 NOTE — Progress Notes (Signed)
Subjective: The patient is sedated on propofol and fentanyl.  She is more alert today than she has been in a few days.  Objective: Vital signs in last 24 hours: Temp:  [97.5 F (36.4 C)-101.4 F (38.6 C)] 101.1 F (38.4 C) (11/18 0400) Pulse Rate:  [72-116] 90 (11/18 0715) Resp:  [11-28] 18 (11/18 0715) BP: (81-169)/(32-81) 143/48 (11/18 0715) SpO2:  [90 %-100 %] 100 % (11/18 0715) FiO2 (%):  [40 %-50 %] 50 % (11/18 0346) Weight:  [106.5 kg] 106.5 kg (11/18 0500) Estimated body mass index is 36.77 kg/m as calculated from the following:   Height as of this encounter: 5\' 7"  (1.702 m).   Weight as of this encounter: 106.5 kg.   Intake/Output from previous day: 11/17 0701 - 11/18 0700 In: 2085.3 [I.V.:428.6; NG/GT:1375; IV Piggyback:281.7] Out: 1972 [Urine:1172; Stool:800] Intake/Output this shift: Total I/O In: -  Out: 50 [Urine:50]  Physical exam Glasgow Coma Scale 9, intubated, [id number].  He is moving all 4 extremities well.  She localizes.  Lab Results: Recent Labs    11/06/23 0514 11/07/23 0515  WBC 13.3* 11.5*  HGB 10.2* 8.4*  HCT 33.2* 28.2*  PLT 267 224   BMET Recent Labs    11/06/23 0514 11/07/23 0515  NA 143 142  K 3.6 3.2*  CL 104 104  CO2 30 30  GLUCOSE 175* 153*  BUN 29* 34*  CREATININE 0.51 0.62  CALCIUM 9.0 8.7*    Studies/Results: [place] EKG SITE RITE  Result Date: 11/06/2023 If Site Rite image not attached, placement could not be confirmed due to current cardiac rhythm.   Assessment/Plan: T6 fracture: The plan is to continue in the TLSO when the head of bed is greater than 30 degrees and avoid risky surgery in this unhealthy patient.  LOS: 11 days     Cristi Loron 11/07/2023, 7:51 AM

## 2023-11-08 ENCOUNTER — Inpatient Hospital Stay (HOSPITAL_COMMUNITY): Payer: Medicare PPO

## 2023-11-08 DIAGNOSIS — Z7189 Other specified counseling: Secondary | ICD-10-CM | POA: Diagnosis not present

## 2023-11-08 DIAGNOSIS — G9341 Metabolic encephalopathy: Secondary | ICD-10-CM | POA: Diagnosis not present

## 2023-11-08 DIAGNOSIS — I609 Nontraumatic subarachnoid hemorrhage, unspecified: Secondary | ICD-10-CM | POA: Diagnosis not present

## 2023-11-08 DIAGNOSIS — Z515 Encounter for palliative care: Secondary | ICD-10-CM | POA: Diagnosis not present

## 2023-11-08 LAB — GLUCOSE, CAPILLARY
Glucose-Capillary: 135 mg/dL — ABNORMAL HIGH (ref 70–99)
Glucose-Capillary: 143 mg/dL — ABNORMAL HIGH (ref 70–99)
Glucose-Capillary: 145 mg/dL — ABNORMAL HIGH (ref 70–99)
Glucose-Capillary: 152 mg/dL — ABNORMAL HIGH (ref 70–99)
Glucose-Capillary: 158 mg/dL — ABNORMAL HIGH (ref 70–99)
Glucose-Capillary: 168 mg/dL — ABNORMAL HIGH (ref 70–99)

## 2023-11-08 LAB — CULTURE, RESPIRATORY W GRAM STAIN

## 2023-11-08 LAB — TRIGLYCERIDES: Triglycerides: 72 mg/dL (ref <150)

## 2023-11-08 MED ORDER — SODIUM CHLORIDE 0.9 % IV SOLN: INTRAVENOUS | Status: AC | PRN

## 2023-11-08 MED ORDER — THIAMINE HCL 100 MG/ML IJ SOLN: 100.0000 mg | Freq: Every day | INTRAMUSCULAR | Status: DC

## 2023-11-08 MED ORDER — THIAMINE HCL 100 MG/ML IJ SOLN: 250.0000 mg | Freq: Every day | INTRAVENOUS | Status: DC

## 2023-11-08 MED ORDER — THIAMINE MONONITRATE 100 MG PO TABS
100.0000 mg | ORAL_TABLET | Freq: Every day | ORAL | Status: DC
  Filled 2023-11-08 (×3): qty 1

## 2023-11-08 MED ORDER — ALBUMIN HUMAN 5 % IV SOLN
12.5000 g | Freq: Once | INTRAVENOUS | Status: AC
  Administered 2023-11-08: 12.5 g via INTRAVENOUS
  Filled 2023-11-08: qty 250

## 2023-11-08 MED ORDER — THIAMINE HCL 100 MG/ML IJ SOLN: 500.0000 mg | Freq: Three times a day (TID) | INTRAVENOUS | Status: DC

## 2023-11-08 MED ORDER — DEXMEDETOMIDINE HCL IN NACL 400 MCG/100ML IV SOLN
0.0000 ug/kg/h | INTRAVENOUS | Status: DC
  Administered 2023-11-08: 0.4 ug/kg/h via INTRAVENOUS
  Administered 2023-11-08: 1.2 ug/kg/h via INTRAVENOUS
  Administered 2023-11-08: 0.7 ug/kg/h via INTRAVENOUS
  Administered 2023-11-09 (×2): 0.9 ug/kg/h via INTRAVENOUS
  Administered 2023-11-09: 1 ug/kg/h via INTRAVENOUS
  Filled 2023-11-08 (×3): qty 100
  Filled 2023-11-08: qty 200
  Filled 2023-11-08: qty 100

## 2023-11-08 MED ORDER — THIAMINE HCL 100 MG/ML IJ SOLN
100.0000 mg | Freq: Every day | INTRAMUSCULAR | Status: DC
  Administered 2023-11-08 – 2023-11-10 (×3): 100 mg via INTRAVENOUS
  Filled 2023-11-08 (×3): qty 2

## 2023-11-08 NOTE — Consult Note (Signed)
NEUROLOGY CONSULT NOTE   Date of service: November 08, 2023 Patient Name: [name] MRN:  [id number] DOB:  1951/10/18 Chief Complaint: "altred mental status" Requesting Provider: Md, Trauma, MD  History of Present Illness  [name] is a 72 y.o. female with past medical significant for known L4-L5 fracture and left wrist fracture, anxiety/depression, hypertension, hypothyroidism, atrial fibrillation on Eliquis, chronic pain, EtOH abuse with relapse this year, falls, breast cancer s/p lumpectomy and chemo, vitamin D deficiency who presented 11/7 via EMS from home status post fall on blood thinners.  Patient received Andexxa.  Patient denied LOC at the time.  Per EMS patient had orthostatic blood pressure changes.  Imaging revealed left traumatic subarachnoid hemorrhage/contusion without significant mass effect and traumatic t5 burst fracture.  Neurosurgery was consulted recommended no neurosurgical intervention at this time.  Patient was placed on Keppra 500 twice daily x 7 days for seizure prophylaxis. Husband has disclosed that patient is a heavy drinker and has been drinking ore than usual lately.     Previous ER visits include 12/2022 s/p fall, 11/2022 for hypoxia, wheezing, 10/2022 for hypoixa, 03/2022 for SOB.   Significant Events: 11/8: Repeat CT showed small but increased volume of SAH.   11/11: Palliative care consulted for goals of care discussion.  Patient made DNR DNI.  11/13: Repeat CT head 11/13 showed decreased small volume of subarachnoid hemorrhage. coreTrak placed. Patient's code status changed to DNR only 11/16: Intubated due to decreased mental status and inability to protect airway. Placed on Pressors.  11/19: Neuro consulted. Placed on LTM EEG.   Neuro consulted for continued altered mental status.   On exam, patient intubated and sedated on small dose of Precedex.  She does not open eyes to voice, does not follow commands.  Cough gag and corneal reflexes  intact, positive doll's eye.  Spontaneous movement of all extremities.  Less movement in the left upper extremity.    ROS   Unable to ascertain due to altered mental status.   Past History   Past Medical History:  Diagnosis Date   Alcohol abuse    Anxiety    Atrial fibrillation [facility])    sees Dr. Flora Lipps   Bilateral swelling of feet    Cancer [facility])    breast   Chronic diarrhea    Chronic pain    Concussion 02/2007   ICU x 3 days   Depression    Hypertension    Hypothyroidism    Personal history of chemotherapy    Personal history of radiation therapy    Spinal stenosis    Thyroid disease ?1994   Vitamin D deficiency     Past Surgical History:  Procedure Laterality Date   BREAST BIOPSY Right 12/20/2019   x2   BREAST LUMPECTOMY Right 01/22/2020   BREAST LUMPECTOMY WITH RADIOACTIVE SEED AND SENTINEL LYMPH NODE BIOPSY Right 01/22/2020   Procedure: RIGHT BREAST LUMPECTOMY WITH RADIOACTIVE SEED X2 AND RIGHT SENTINEL LYMPH NODE MAPPING;  Surgeon: Harriette Bouillon, MD;  Location: [facility];  Service: General;  Laterality: Right;   BREAST SURGERY Right 03/1998   breast biopsy, benign   EYE SURGERY Right    PORT-A-CATH REMOVAL Right 04/30/2021   Procedure: REMOVAL PORT-A-CATH;  Surgeon: Harriette Bouillon, MD;  Location: [facility];  Service: General;  Laterality: Right;   PORTACATH PLACEMENT Right 01/22/2020   Procedure: INSERTION PORT-A-CATH WITH ULTRASOUND;  Surgeon: Harriette Bouillon, MD;  Location: [facility];  Service: General;  Laterality:  Right;   PORTACATH PLACEMENT Right 02/28/2020   Procedure: PORT A CATH REVISION;  Surgeon: Harriette Bouillon, MD;  Location: [facility] OR;  Service: General;  Laterality: Right;   REVERSE SHOULDER ARTHROPLASTY Left 02/02/2021   Procedure: LEFT REVERSE SHOULDER ARTHROPLASTY;  Surgeon: Cammy Copa, MD;  Location: [facility] OR;  Service: Orthopedics;  Laterality: Left;   SHOULDER SURGERY Right    SPINAL  FUSION  03/04/2011   with ORIF    Family History: Family History  Problem Relation Age of Onset   Thyroid disease Mother    Dementia Mother    Depression Mother    Anxiety disorder Mother    Diabetes Father    Stroke Father    Alcoholism Father    Hypertension Sister    Diabetes Sister    Colon cancer Neg Hx    Colon polyps Neg Hx    Stomach cancer Neg Hx    Esophageal cancer Neg Hx     Social History  reports that she quit smoking about 44 years ago. Her smoking use included cigarettes. She started smoking about 64 years ago. She has never used smokeless tobacco. She reports current alcohol use of about 5.0 standard drinks of alcohol per week. She reports that she does not use drugs.  Allergies  Allergen Reactions   Zolpidem Tartrate Other (See Comments)    Hallucinations and Confusion     Medications   Current Facility-Administered Medications:    0.9 %  sodium chloride infusion, 250 mL, Intravenous, Continuous, Lovick, Lennie Odor, MD, Held at 11/07/23 1330   acetaminophen (TYLENOL) tablet 1,000 mg, 1,000 mg, Per Tube, Q6H, [name], MD, 1,000 mg at 11/08/23 0508   busPIRone (BUSPAR) tablet 15 mg, 15 mg, Per Tube, TID, [name], MD, 15 mg at 11/08/23 0903   carvedilol (COREG) tablet 25 mg, 25 mg, Per Tube, BID WC, Lysle Rubens, MD, 25 mg at 11/08/23 5784   Chlorhexidine Gluconate Cloth 2 % PADS 6 each, 6 each, Topical, Daily, Kinsinger, De Blanch, MD, 6 each at 11/08/23 1051   dexmedetomidine (PRECEDEX) 400 MCG/100ML (4 mcg/mL) infusion, 0-1.2 mcg/kg/hr, Intravenous, Titrated, Lovick, Lennie Odor, MD, Last Rate: 10.59 mL/hr at 11/08/23 1048, 0.4 mcg/kg/hr at 11/08/23 1048   enoxaparin (LOVENOX) injection 40 mg, 40 mg, Subcutaneous, Q12H, Lovick, Lennie Odor, MD, 40 mg at 11/08/23 0903   feeding supplement (PIVOT 1.5 CAL) liquid 1,000 mL, 1,000 mL, Per Tube, Continuous, [name], MD, Last Rate: 55 mL/hr at 11/08/23 1000, Infusion Verify at 11/08/23 1000    fentaNYL (SUBLIMAZE) bolus via infusion 25 mcg, 25 mcg, Intravenous, Q30 min PRN, Lysle Rubens, MD   fentaNYL in NS (68mcg/ml) infusion-PREMIX, 0-200 mcg/hr, Intravenous, Continuous, Lysle Rubens, MD, Last Rate: 5 mL/hr at 11/08/23 1000, 50 mcg/hr at 11/08/23 1000   FLUoxetine (PROZAC) capsule 20 mg, 20 mg, Per Tube, Daily, Mariel Craft, MD, 20 mg at 11/08/23 6962   folic acid (FOLVITE) tablet 1 mg, 1 mg, Per Tube, Daily, [name], MD, 1 mg at 11/08/23 9528   haloperidol (HALDOL) 2 MG/ML solution 5 mg, 5 mg, Per Tube, TID, [name], MD, 5 mg at 11/08/23 0933   haloperidol lactate (HALDOL) injection 10 mg, 10 mg, Intravenous, Q6H PRN, [name], MD, 10 mg at 11/04/23 0242   hydrALAZINE (APRESOLINE) injection 10 mg, 10 mg, Intravenous, Q4H PRN, [name], MD, 10 mg at 11/04/23 2113   levothyroxine (SYNTHROID) tablet 150 mcg, 150 mcg, Per Tube, Q0600, [name], MD, 150  mcg at 11/08/23 0507   methocarbamol (ROBAXIN) injection 1,000 mg, 1,000 mg, Intravenous, Q8H, [name], MD, 1,000 mg at 11/08/23 0509   metoprolol tartrate (LOPRESSOR) injection 5 mg, 5 mg, Intravenous, Q6H PRN, [name], MD   multivitamin with minerals tablet 1 tablet, 1 tablet, Per Tube, Daily, [name], MD, 1 tablet at 11/08/23 0903   naloxone [facility]) injection 0.4 mg, 0.4 mg, Intravenous, PRN, Andria Meuse, MD, 0.4 mg at 11/02/23 1505   norepinephrine (LEVOPHED) 4mg  in (0.016 mg/mL) premix infusion, 0-40 mcg/min, Intravenous, Titrated, Lovick, Lennie Odor, MD, Last Rate: 33.8 mL/hr at 11/08/23 1000, 9 mcg/min at 11/08/23 1000   ondansetron (ZOFRAN-ODT) disintegrating tablet 4 mg, 4 mg, Oral, Q6H PRN **OR** ondansetron (ZOFRAN) injection 4 mg, 4 mg, Intravenous, Q6H PRN, Kinsinger, De Blanch, MD, 4 mg at 10/29/23 1205   Oral care mouth rinse, 15 mL, Mouth Rinse, Q2H, Lysle Rubens, MD, 15 mL at 11/08/23 1191   Oral care mouth rinse, 15 mL,  Mouth Rinse, PRN, Lysle Rubens, MD   oxyCODONE (Oxy IR/ROXICODONE) immediate release tablet 5-10 mg, 5-10 mg, Per Tube, Q4H PRN, [name], MD, 5 mg at 11/08/23 0557   polyethylene glycol (MIRALAX / GLYCOLAX) packet 17 g, 17 g, Per Tube, Daily PRN, [name], MD   polyethylene glycol (MIRALAX / GLYCOLAX) packet 17 g, 17 g, Per Tube, Daily, [name], MD, 17 g at 11/08/23 0904   propofol (DIPRIVAN) bolus via infusion 10-20 mg, 10-20 mg, Intravenous, Q1H PRN, Lysle Rubens, MD   sodium chloride (OCEAN) 0.65 % nasal spray 1 spray, 1 spray, Each Nare, PRN, [name], MD, 1 spray at 10/30/23 1156   sodium chloride flush (NS) 0.9 % injection 10-40 mL, 10-40 mL, Intracatheter, Q12H, Tressie Stalker, MD, 10 mL at 11/08/23 0905   sodium chloride flush (NS) 0.9 % injection 10-40 mL, 10-40 mL, Intracatheter, PRN, Tressie Stalker, MD   thiamine (VITAMIN B1) injection 100 mg, 100 mg, Intravenous, Daily **OR** thiamine (VITAMIN B1) tablet 100 mg, 100 mg, Oral, Daily, Erick Blinks, MD   traZODone (DESYREL) tablet 100 mg, 100 mg, Per Tube, Ulice Bold, MD, 100 mg at 11/07/23 2212  Vitals   Vitals:   11/08/23 0830 11/08/23 0904 11/08/23 0945 11/08/23 1000  BP: (!) 153/61 (!) 151/63 (!) 140/68 (!) 153/54  Pulse: 82 86 87 87  Resp: 18  (!) 24 (!) 26  Temp:      TempSrc:      SpO2: 100%  100% 100%  Weight:      Height:        Body mass index is 36.57 kg/m.  Physical Exam   Constitutional: Critically ill appearing HHENT: [place]/AT.  ETT/NGT present Cardiovascular: Normal rate and regular rhythm.  Respiratory: Mechanically ventilated  Neurologic Examination   Neuro: Mental Status: Patient does not open eyes to voice, does not follow commands. Cranial Nerves: II: Pupils are equal, round, and reactive to light.   III,IV, VI: Does not track Motor: Tone is normal. Bulk is normal.  Generalized weakness, spontaneous movement in all extremities. LUE:  4/5 Sensory: Grimaces to pain in all 4 extremities.  Localizes to pain RUE, RLE, LLE. withdraws to pain LUE.   Labs/Imaging/Neurodiagnostic studies   CBC:  Recent Labs  Lab 12/06/2023 0514 11/07/23 0515  WBC 13.3* 11.5*  HGB 10.2* 8.4*  HCT 33.2* 28.2*  MCV 104.7* 110.6*  PLT 267 224   Basic Metabolic Panel:  Lab Results  Component Value Date  NA 142 11/07/2023   K 3.2 (L) 11/07/2023   CO2 30 11/07/2023   GLUCOSE 153 (H) 11/07/2023   BUN 34 (H) 11/07/2023   CREATININE 0.62 11/07/2023   CALCIUM 8.7 (L) 11/07/2023   GFRNONAA >60 11/07/2023   GFRAA >60 09/02/2020   Lipid Panel:  Lab Results  Component Value Date   LDLCALC 140 (H) 07/21/2022   HgbA1c:  Lab Results  Component Value Date   HGBA1C 5.8 (H) 07/21/2022   Urine Drug Screen:     Component Value Date/Time   LABOPIA NONE DETECTED 12/12/2020 2041   COCAINSCRNUR NONE DETECTED 12/12/2020 2041   LABBENZ POSITIVE (A) 12/12/2020 2041   AMPHETMU NONE DETECTED 12/12/2020 2041   THCU NONE DETECTED 12/12/2020 2041   LABBARB NONE DETECTED 12/12/2020 2041    Alcohol Level     Component Value Date/Time   ETH <10 12/12/2020 2041   INR  Lab Results  Component Value Date   INR 0.95 07/11/2017   APTT No results found for: "APTT" AED levels: No results found for: "PHENYTOIN", "ZONISAMIDE", "LAMOTRIGINE", "LEVETIRACETA"  CT Head without contrast 11/7 (Personally reviewed): Subarachnoid and subdural hemorrhage along the left middle cranial fossa subdural measuring approximately 4 mm Small amount of hyperdensity in fourth ventricle No hydrocephalus  CT cervical spine without contrast 11/7 (personally reviewed): No acute cervical spine fracture  CT Thoracic spine without contrast 11/7 (personally reviewed): Acute T6 compression fracture with burst component  MRI thoracic spine: Burst fracture at T6-7 if persistent 75% height loss and 7 mm of retropulsion that severely narrows spinal canal and impinges on the  spinal cord. Mild T5 wedge compression fracture  CT lumbar spine without contrast 11/7 (personally reviewed): No acute lumbar spine fracture  Stable postsurgical site changes from L4-L5 discectomy and posterior fusion  MRI Brain 11/7 (Personally reviewed): Acute blood products left middle cranial fossa No acute infarct Old left frontal infarcts Findings of chronic small vessel ischemia  Repeat CT Head without  contrast 11/8 (Personally reviewed): Small increased volume of basilar cistern predominant subarachnoid hemorrhage  Repeat CT head without contrast 11/13 (personally reviewed): Decreased small volume of subarachnoid hemorrhage No evidence of new/interval acute abnormality  Neurodiagnostics LTM EEG:  PENDING  ASSESSMENT   [name] is a 72 y.o. female with past medical significant for known L4-L5 fracture and left wrist fracture, anxiety/depression, hypertension, hypothyroidism, atrial fibrillation on Eliquis, chronic pain, EtOH abuse with relapse this year, falls, breast cancer s/p lumpectomy and chemo, vitamin D deficiency who presented 11/7 via EMS from home status post fall on blood thinners.  Patient received Andexxa. Imaging revealed left traumatic subarachnoid hemorrhage/contusion without significant mass effect and traumatic t5 burst fracture, no neurosurgical intervention at this time. Throughout hospital course, patient had increased anxiety and pain requiring multiple deliriogenic medications. Patient has declined neurologically and is not following commands. Subclinical seizures are a possibility and we will get LTM EEG running, pending AM read.   RECOMMENDATIONS  - LTM EEG - continue Thiamine supplementation - wean sedation as able - avoid deliriogenic medications _______________________________________________________________  Pt seen by Neuro NP/APP and later by MD. Note/plan to be edited by MD as needed.    Lynnae January, DNP, AGACNP-BC [facility] Please use AMION for contact information & EPIC for messaging.   NEUROHOSPITALIST ADDENDUM Performed a face to face diagnostic evaluation.   I have reviewed the contents of history and physical exam as documented by PA/ARNP/Resident and agree with above documentation.  I have discussed and  formulated the above plan as documented. Edits to the note have been made as needed.  Impression/Key exam findings/Plan: we were asked to assess her to identify reversible etiology of her significant encephalopathy precluding extubation at this time. Given L temporal SAH, prudent to evaluate for subclinical seizures. She was also just switched to precedex from propofol and still requiring sedation. Will keep her on LTM EEG to monitor for subclinical seizures and see how she does off propofol.  Neuro exam with intact brainstem reflexes, grimaces to noxious stimuli and vigorously shakes her head left to right with nares stimulation.  Erick Blinks, MD [facility] [phone]   If 7pm to 7am, please call on call as listed on AMION.

## 2023-11-08 NOTE — Procedures (Signed)
Arterial Line Insertion  Preanesthetic checklist: patient identified, IV checked, site marked, surgical consent, monitors and equipment checked and timeout performed Right, radial was placed Catheter size: 20 G Hand hygiene performed  and maximum sterile barriers used  Allen's test indicative of satisfactory collateral circulation Attempts: 3 Procedure performed without using ultrasound guided technique. Following insertion, dressing applied and Biopatch. Post procedure assessment: normal  Post procedure complications: unsuccessful attempts. Patient tolerated the procedure well with no immediate complications. Additional procedure comments: RT x 2 attempted x 3 with success..   Alarms Checked

## 2023-11-08 NOTE — Progress Notes (Addendum)
Trauma/Critical Care Follow Up Note  Subjective:    Overnight Issues:   Objective:  Vital signs for last 24 hours: Temp:  [98.1 F (36.7 C)-100.2 F (37.9 C)] 100.2 F (37.9 C) (11/19 0800) Pulse Rate:  [68-91] 86 (11/19 0904) Resp:  [12-24] 18 (11/19 0830) BP: (86-162)/(36-79) 151/63 (11/19 0904) SpO2:  [97 %-100 %] 100 % (11/19 0830) FiO2 (%):  [40 %] 40 % (11/19 0810) Weight:  [105.9 kg] 105.9 kg (11/19 0500)  Hemodynamic parameters for last 24 hours:    Intake/Output from previous day: 11/18 0701 - 11/19 0700 In: 2385 [I.V.:1065; NG/GT:1320] Out: 2070 [Urine:1470; Stool:600]  Intake/Output this shift: Total I/O In: 143.5 [I.V.:88.5; NG/GT:55] Out: 75 [Urine:75]  Vent settings for last 24 hours: Vent Mode: PSV;CPAP FiO2 (%):  [40 %] 40 % Set Rate:  [18 bmp] 18 bmp Vt Set:  [490 mL] 490 mL PEEP:  [5 cmH20] 5 cmH20 Pressure Support:  [10 cmH20-12 cmH20] 10 cmH20 Plateau Pressure:  [21 cmH20] 21 cmH20  Physical Exam:  Gen: comfortable, no distress Neuro: not following commands HEENT: PERRL Neck: supple CV: RRR Pulm: unlabored breathing on mechanical ventilation-pressure support Abd: soft, NT    GU: urine clear and yellow, +Foley Extr: wwp, no edema  Results for orders placed or performed during the hospital encounter of 10/28/2023 (from the past 24 hour(s))  Glucose, capillary     Status: Abnormal   Collection Time: 11/07/23 11:13 AM  Result Value Ref Range   Glucose-Capillary 166 (H) 70 - 99 mg/dL  Glucose, capillary     Status: Abnormal   Collection Time: 11/07/23  3:02 PM  Result Value Ref Range   Glucose-Capillary 155 (H) 70 - 99 mg/dL  Glucose, capillary     Status: Abnormal   Collection Time: 11/07/23  7:51 PM  Result Value Ref Range   Glucose-Capillary 168 (H) 70 - 99 mg/dL  Glucose, capillary     Status: Abnormal   Collection Time: 11/07/23 11:47 PM  Result Value Ref Range   Glucose-Capillary 150 (H) 70 - 99 mg/dL  Glucose, capillary      Status: Abnormal   Collection Time: 11/08/23  3:41 AM  Result Value Ref Range   Glucose-Capillary 143 (H) 70 - 99 mg/dL  Triglycerides     Status: None   Collection Time: 11/08/23  5:37 AM  Result Value Ref Range   Triglycerides 72 <150 mg/dL  Glucose, capillary     Status: Abnormal   Collection Time: 11/08/23  7:48 AM  Result Value Ref Range   Glucose-Capillary 168 (H) 70 - 99 mg/dL    Assessment & Plan: The plan of care was discussed with the bedside nurse for the day, [name], who is in agreement with this plan and no additional concerns were raised.   Present on Admission:  Closed unstable burst fracture of T5 vertebra (HCC)    LOS: 12 days   Additional comments:I reviewed the patient's new clinical lab test results.   and I reviewed the patients new imaging test results.    [name] s/p fall   SAH, IVH - NSGY c/s, [name], repeat head CT reviewed, keppra x7d AF on eliquis - rec'd Andexxa, hold for now Subacute T5 burst fx - MRI completed, Being managed pre-admission by Dr. Lovell [name]. Brace at bedside. Okay to ambulate, monitor for neuro sx after ambulation. ETOH withdrawal, altered mental status - low dose phenobarb, now off, lethargy and decline in mentation resulted in intubation 11/15 PM. Remains not f/c,  reversible causes addressed. Suspect Wernicke's encephalopathy HTN - on home meds R 6th rib - pain control, pulm toilet  FEN- NPO, TF, d/c lactulose, replete hypokalemia VTE- LMWH ID- resp cx with Strep, based on clinical exam, likely a part of normal flora Dispo- ICU   Clinical update provided to patient's husband and daughter with Palliative team. Expressed my concerns that we have all reversible etiologies of her neurologic state have been addressed and that her neurologic state is unlikely to recover in the short term or potentially at all. Have consulted neurology for a second opinion regarding this and they would like to perform an EEG to r/o subclinical sz as an  etiology.   Critical Care Total Time: 80 minutes  Isabel Monks, MD Trauma & General Surgery Please use [email or web address] to contact on call provider  11/08/2023  *Care during the described time interval was provided by me. I have reviewed this patient's available data, including medical history, events of note, physical examination and test results as part of my evaluation.

## 2023-11-08 NOTE — Plan of Care (Signed)
Patient admitted s/p fall. Upon initial assessment patient was found to have increased drowsiness. Precedex infusion initiated patient able to open eyes more spontaneously, still unable to follow commands at this time. EEG on, monitoring continuously. PRN Oxy and Haldol given, minimal relief noted. A-line inserted on shift, levo titrated appropriately. Palliative at bedside to discuss plan of care with family. Patient and family updated in plan of care. ICU status maintained.   Problem: Education: Goal: Knowledge of General Education information will improve Description: Including pain rating scale, medication(s)/side effects and non-pharmacologic comfort measures 11/08/2023 1927 by Jeannine Kitten, RN Outcome: Not Progressing 11/08/2023 1927 by Jeannine Kitten, RN Outcome: Progressing   Problem: Health Behavior/Discharge Planning: Goal: Ability to manage health-related needs will improve 11/08/2023 1927 by Jeannine Kitten, RN Outcome: Not Progressing 11/08/2023 1927 by Jeannine Kitten, RN Outcome: Progressing   Problem: Clinical Measurements: Goal: Ability to maintain clinical measurements within normal limits will improve 11/08/2023 1927 by Jeannine Kitten, RN Outcome: Not Progressing 11/08/2023 1927 by Jeannine Kitten, RN Outcome: Progressing Goal: Will remain free from infection 11/08/2023 1927 by Jeannine Kitten, RN Outcome: Not Progressing 11/08/2023 1927 by Jeannine Kitten, RN Outcome: Progressing Goal: Diagnostic test results will improve 11/08/2023 1927 by Jeannine Kitten, RN Outcome: Not Progressing 11/08/2023 1927 by Jeannine Kitten, RN Outcome: Progressing Goal: Respiratory complications will improve 11/08/2023 1927 by Jeannine Kitten, RN Outcome: Not Progressing 11/08/2023 1927 by Jeannine Kitten, RN Outcome: Progressing Goal: Cardiovascular complication will be avoided 11/08/2023 1927 by Jeannine Kitten, RN Outcome: Not  Progressing 11/08/2023 1927 by Jeannine Kitten, RN Outcome: Progressing   Problem: Activity: Goal: Risk for activity intolerance will decrease 11/08/2023 1927 by Jeannine Kitten, RN Outcome: Not Progressing 11/08/2023 1927 by Jeannine Kitten, RN Outcome: Progressing   Problem: Nutrition: Goal: Adequate nutrition will be maintained 11/08/2023 1927 by Jeannine Kitten, RN Outcome: Not Progressing 11/08/2023 1927 by Jeannine Kitten, RN Outcome: Progressing   Problem: Coping: Goal: Level of anxiety will decrease 11/08/2023 1927 by Jeannine Kitten, RN Outcome: Not Progressing 11/08/2023 1927 by Jeannine Kitten, RN Outcome: Progressing   Problem: Elimination: Goal: Will not experience complications related to bowel motility 11/08/2023 1927 by Jeannine Kitten, RN Outcome: Not Progressing 11/08/2023 1927 by Jeannine Kitten, RN Outcome: Progressing Goal: Will not experience complications related to urinary retention 11/08/2023 1927 by Jeannine Kitten, RN Outcome: Not Progressing 11/08/2023 1927 by Jeannine Kitten, RN Outcome: Progressing   Problem: Pain Management: Goal: General experience of comfort will improve 11/08/2023 1927 by Jeannine Kitten, RN Outcome: Not Progressing 11/08/2023 1927 by Jeannine Kitten, RN Outcome: Progressing   Problem: Safety: Goal: Ability to remain free from injury will improve 11/08/2023 1927 by Jeannine Kitten, RN Outcome: Not Progressing 11/08/2023 1927 by Jeannine Kitten, RN Outcome: Progressing   Problem: Skin Integrity: Goal: Risk for impaired skin integrity will decrease 11/08/2023 1927 by Jeannine Kitten, RN Outcome: Not Progressing 11/08/2023 1927 by Jeannine Kitten, RN Outcome: Progressing   Problem: Safety: Goal: Non-violent Restraint(s) 11/08/2023 1927 by Jeannine Kitten, RN Outcome: Not Progressing 11/08/2023 1927 by Jeannine Kitten, RN Outcome: Progressing

## 2023-11-08 NOTE — Progress Notes (Signed)
Palliative Medicine Inpatient Follow Up Note HPI: 72 yo female was walking and lost balance and fell backward. She complains of intense pain in her back. She takes eliquis for atrial fibrillation.    [name] has been asked to get involved to further support goals of care conversations.   Today's Discussion 11/08/2023  *Please note that this is a verbal dictation therefore any spelling or grammatical errors are due to the "Dragon Medical One" system interpretation.  Chart reviewed inclusive of vital signs, progress notes, laboratory results, and diagnostic images.   I met with patients RN, Sherron Ales this morning. She shares with me that patient is now on precedex. She has for the most had a lack of meaningful improvement. She remains to open her eyes though is not able to follow directions.  A meeting was held this afternoon in the presence of patients daughter, [name] and patients spouse, [name]. Providers present were Dr. Bedelia Person and myself.  We reviewed patients complicated hospital admission and how despite the best efforts of the trauma surgery team there are very little improvements to speak of in terms of the patient herself. We reviewed the various reasons which may be the underlying cause of this inclusive of her SAH, her complicated withdrawal, and ongoing delirium.   Dr. Bedelia Person shares that she has reached out to the Neurology team to provide insights in regards to patients case and the verify we are not missing anything. She further reviewed the plan for EEG monitoring and formal neurological evaluation.   If the above does not result in fruitful results then we are likely in a situation whereby patient may be at her new baseline.   I shared openly and honestly the concerns associated with patients present clinical state. Her inability to appropriately absorb the nutrition (Alb 2.1) being provided resulting in third spacing. We discussed what quality of life would or would not be  acceptable to the patient.   Dr. Bedelia Person expressed that patients mental state is what is inhibitory towards pursuing a safe extubation. In her case it is not necessarily the fact that patient can breath it's truly her ability to recognize how to tolerate her secretion burden which is inhibited by her mental state.   We discussed possibilities if patient neglects to improve inclusive of comfort medicated care.   Plan to allow time for neurology to evaluate patient.   Patients sister will be in town tomorrow for further conversations. 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  Emotional support provided through therapeutic silence.  Patients family decline chaplain support at this time.   Questions and concerns addressed/Palliative Support Provided.   Objective Assessment: Vital Signs Vitals:   11/08/23 0945 11/08/23 1000  BP: (!) 140/68 (!) 153/54  Pulse: 87 87  Resp: (!) 24 (!) 26  Temp:    SpO2: 100% 100%    Intake/Output Summary (Last 24 hours) at 11/08/2023 1110 Last data filed at 11/08/2023 1000 Gross per 24 hour  Intake 2460.86 ml  Output 2020 ml  Net 440.86 ml   Last Weight  Most recent update: 11/08/2023  5:21 AM    Weight  105.9 kg (233 lb 7.5 oz)            Gen:  Elderly Caucasian F chronically ill appearing HEENT:  coretrack, ETT, dry mucous membranes CV: Regular rate and irregular rhythm PULM: ON mechanical ventilator ABD: soft/nontender  EXT: (+)  edema, (+) third spacing BLUE Neuro: Somnolent  SUMMARY OF RECOMMENDATIONS   DNAR --> Patients family share the would not want patient to have a tracheostomy  Open and honest conversations held in the setting of patients present condition --> discussed options if patient neglects to improve inclusive of comfort care  Appreciate Neurology insights  Plan for additional conversations when patients sister arrives  Ongoing PMT  support    Time Spent:  4 ______________________________________________________________________________________ Lamarr Lulas [place] Palliative Medicine Team Team Cell Phone: [phone] Please utilize secure chat with additional questions, if there is no response within 30 minutes please call the above phone number  Palliative Medicine Team providers are available by phone from 7am to 7pm daily and can be reached through the team cell phone.  Should this patient require assistance outside of these hours, please call the patient's attending physician.

## 2023-11-08 NOTE — Progress Notes (Signed)
SLP Cancellation Note  Patient Details Name: [name] MRN: [id number] DOB: October 08, 1951   Cancelled treatment:       Reason Eval/Treat Not Completed: Patient not medically ready. Will continue to follow.   Gwynneth Aliment, M.A., CF-SLP Speech Language Pathology, Acute Rehabilitation Services  Secure Chat preferred [phone]  11/08/2023, 10:17 AM

## 2023-11-08 NOTE — Progress Notes (Signed)
LTM EEG set up with CT wires. Patient being monitored by [facility]. Test button tested.

## 2023-11-08 NOTE — Progress Notes (Signed)
Physical Therapy Treatment Patient Details Name: [name] MRN: [id number] DOB: 1951-05-31 Today's Date: 11/08/2023   History of Present Illness 72 year old female with chronic A-fib on Eliquis, who lost her balance and fell backward and hit her head.  Workup revealed left traumatic subarachnoid hemorrhage/contusion without significant mass effect.  There is also a T6 burst fracture with retropulsion and significant loss of height.  Developed respiratory distress and Bipap ineffective so intubated on 11/15.    PT Comments  Focus of session on more upright sitting in bed and on UE ROM, positioning and edema management.  Patient able to tolerate rolling to don brace then scooted up in bed and lifted to chair position in bed.  More alert at times when family engaging and noted pt with eyes open though not really following commands as moving arms spontaneously at times, but not on command.  She maintained vitals except brief drop in BP when initially up in chair position.  Family present and supportive and initiated education on helping with edema massage to hands, forearms while pt sitting with arms elevated.  PT will continue to follow.     If plan is discharge home, recommend the following: Two people to help with walking and/or transfers;Two people to help with bathing/dressing/bathroom   Can travel by private vehicle     No  Equipment Recommendations  None recommended by PT    Recommendations for Other Services       Precautions / Restrictions Precautions Precautions: Fall;Back Precaution Booklet Issued: No Precaution Comments: TLSO donned in supine, on in bed if HOB >30*. Bilateral restraints, coretrak Required Braces or Orthoses: Spinal Brace Spinal Brace: Thoracolumbosacral orthotic;Applied in supine position     Mobility  Bed Mobility Overal bed mobility: Needs Assistance Bed Mobility: Rolling Rolling: Total assist, +2 for physical assistance         General bed  mobility comments: Total A +2 for rolling in bed to don brace; brought into sitting position in bed    Transfers                   General transfer comment: NT, pt too lethargic    Ambulation/Gait                   Stairs             Wheelchair Mobility     Tilt Bed    Modified Rankin (Stroke Patients Only)       Balance                                            Cognition Arousal: Obtunded Behavior During Therapy: Flat affect Overall Cognitive Status: Difficult to assess                                 General Comments: Patient inconsistently opening her eyes to names when said by daughter or husband (10% of time). Following commands to "wiggle your fingers" by her husband but patient picking up her whole arm. Pt keeping eyes closed for majority of session.        Exercises General Exercises - Upper Extremity Shoulder Flexion: PROM, Both, 10 reps, Supine Elbow Flexion: PROM, Both, 10 reps, Supine Elbow Extension: PROM, Both, 10 reps, Supine Wrist Flexion: PROM, Both, 10 reps, Supine  Wrist Extension: PROM, Both, 10 reps, Supine Digit Composite Flexion: PROM, Both, 10 reps, Supine Composite Extension: PROM, Both, 10 reps, Supine    General Comments General comments (skin integrity, edema, etc.): HR maintained in 80-90's and SpO2 100% on PS/CPAP 40% FiO2 PEEP 5; BP initially in chair position 106/59, repeat measurement with SCD's turned on 145/59; daughter and spouse at the bedside and supportive      Pertinent Vitals/Pain Pain Assessment Pain Assessment: CPOT Facial Expression: Tense Body Movements: Protection Muscle Tension: Tense, rigid Compliance with ventilator (intubated pts.): Tolerating ventilator or movement Vocalization (extubated pts.): N/A CPOT Total: 3 Pain Location: generalized with mobility Pain Descriptors / Indicators: Moaning, Guarding, Grimacing Pain Intervention(s): Monitored during  session, Limited activity within patient's tolerance    Home Living                          Prior Function            PT Goals (current goals can now be found in the care plan section) Progress towards PT goals: Not progressing toward goals - comment (pt intubated since last session)    Frequency    Min 1X/week      PT Plan      Co-evaluation PT/OT/SLP Co-Evaluation/Treatment: Yes Reason for Co-Treatment: Complexity of the patient's impairments (multi-system involvement);To address functional/ADL transfers;For patient/therapist safety PT goals addressed during session: Mobility/safety with mobility;Balance OT goals addressed during session: Strengthening/ROM;ADL's and self-care      AM-PAC PT "6 Clicks" Mobility   Outcome Measure  Help needed turning from your back to your side while in a flat bed without using bedrails?: Total Help needed moving from lying on your back to sitting on the side of a flat bed without using bedrails?: Total Help needed moving to and from a bed to a chair (including a wheelchair)?: Total Help needed standing up from a chair using your arms (e.g., wheelchair or bedside chair)?: Total Help needed to walk in hospital room?: Total Help needed climbing 3-5 steps with a railing? : Total 6 Click Score: 6    End of Session Equipment Utilized During Treatment: Back brace;Oxygen Activity Tolerance: Patient limited by lethargy Patient left: in bed;with family/visitor present;with call bell/phone within reach   PT Visit Diagnosis: Other abnormalities of gait and mobility (R26.89);Muscle weakness (generalized) (M62.81);Other symptoms and signs involving the nervous system (R29.898)     Time: 4098-1191 PT Time Calculation (min) (ACUTE ONLY): 30 min  Charges:    $Therapeutic Activity: 8-22 mins PT General Charges $$ ACUTE PT VISIT: 1 Visit                     Sheran Lawless, PT Acute Rehabilitation  Services Office:[phone] 11/08/2023    [name] 11/08/2023, 5:19 PM

## 2023-11-08 NOTE — Progress Notes (Addendum)
This RN Spoke on the phone with pt's daughter [name]. She is requesting to speak with neurology regarding patients neurological status. She states her Father was updated by Dr. Derry Lory today during day shift but further clarification is needed. Her number is [phone].

## 2023-11-08 NOTE — Progress Notes (Addendum)
Occupational Therapy Treatment Patient Details Name: [name] MRN: [id number] DOB: Mar 03, 1951 Today's Date: 11/08/2023   History of present illness 72 year old female with chronic A-fib on Eliquis, who lost her balance and fell backward and hit her head.  Workup revealed left traumatic subarachnoid hemorrhage/contusion without significant mass effect.  There is also a T6 burst fracture with retropulsion and significant loss of height.   OT comments  Upon arrival, pt supine and intubated with family at bedside. Facilitating PROM of BUEs to address increased edema at hands and forearms. Pt requiring Total A +2 for rolling in bed to don brace. Pt tolerating position of bed into chair position (initial drop in BP bu recovering). Pt maintaining eyes closed for majority of session; opening eyes ~3-4 times to family saying her name. Palliative and MD arriving for meeting after therapy session. Will continue to follow acutely as medically stable and recommendation pending progress and family decision.       If plan is discharge home, recommend the following:  Two people to help with walking and/or transfers;Two people to help with bathing/dressing/bathroom;Supervision due to cognitive status;Assistance with feeding;Assist for transportation;Help with stairs or ramp for entrance;Direct supervision/assist for financial management;Direct supervision/assist for medications management   Equipment Recommendations  Other (comment) (defer to next venue of care)    Recommendations for Other Services      Precautions / Restrictions Precautions Precautions: Fall;Back Precaution Booklet Issued: No Precaution Comments: TLSO donned in supine, on in bed if HOB >30*. Bilateral restraints, coretrak Required Braces or Orthoses: Spinal Brace Spinal Brace: Thoracolumbosacral orthotic;Applied in supine position       Mobility Bed Mobility Overal bed mobility: Needs Assistance Bed Mobility:  Rolling Rolling: Total assist, +2 for physical assistance         General bed mobility comments: Total A +2 for rolling in bed to don brace    Transfers                   General transfer comment: NT, pt too lethargic     Balance                                           ADL either performed or assessed with clinical judgement   ADL Overall ADL's : Needs assistance/impaired                                       General ADL Comments: Total A for bed mobility and ROM. Rolling at bed level to don brace. Sitting patient up to 60degrees with bed in chair position. Faciltiating PROM of BUEs to reduce edema at hands and arms.    Extremity/Trunk Assessment Upper Extremity Assessment Upper Extremity Assessment: Difficult to assess due to impaired cognition   Lower Extremity Assessment Lower Extremity Assessment: Defer to PT evaluation        Vision   Vision Assessment?: Wears glasses for reading (Unable to formally assess vision due to cognition)   Perception Perception Perception: Not tested   Praxis Praxis Praxis: Not tested    Cognition Arousal: Obtunded Behavior During Therapy: Flat affect Overall Cognitive Status: Difficult to assess Area of Impairment: Orientation, Attention, Memory, Following commands, Safety/judgement, Problem solving, Awareness  Orientation Level: Disoriented to, Place, Time, Situation Current Attention Level: Focused Memory: Decreased short-term memory, Decreased recall of precautions Following Commands: Follows one step commands inconsistently Safety/Judgement: Decreased awareness of safety, Decreased awareness of deficits Awareness: Intellectual Problem Solving: Slow processing, Decreased initiation, Difficulty sequencing, Requires verbal cues, Requires tactile cues General Comments: Patient inconsistently opening her eyes to names when said by daughter or husband (10% of  time). Following commands to "wiggle your fingers" by her husband but patient picking up her whole arm. Pt keeping eyes closed for majority of session.        Exercises Exercises: General Upper Extremity General Exercises - Upper Extremity Shoulder Flexion: PROM, Both, 10 reps, Supine Elbow Flexion: PROM, Both, 10 reps, Supine Elbow Extension: PROM, Both, 10 reps, Supine Wrist Flexion: PROM, Both, 10 reps, Supine Wrist Extension: PROM, Both, 10 reps, Supine Digit Composite Flexion: PROM, Both, 10 reps, Supine Composite Extension: PROM, Both, 10 reps, Supine    Shoulder Instructions       General Comments HR maintaining 80s-90s. SpO2 stable at 100% on vent. RR 20s. BP dropping to 106/59 upon initial sitting at bed; BP elevating to 145/59    Pertinent Vitals/ Pain       Pain Assessment Pain Assessment: Faces Faces Pain Scale: Hurts even more Pain Location: generalized with mobility Pain Descriptors / Indicators: Moaning, Guarding, Grimacing Pain Intervention(s): Monitored during session, Limited activity within patient's tolerance, Repositioned  Home Living                                          Prior Functioning/Environment              Frequency  Min 1X/week        Progress Toward Goals  OT Goals(current goals can now be found in the care plan section)  Progress towards OT goals: Not progressing toward goals - comment  Acute Rehab OT Goals OT Goal Formulation: Patient unable to participate in goal setting Time For Goal Achievement: 11/14/23 Potential to Achieve Goals: Fair ADL Goals Pt Will Perform Grooming: with set-up;sitting Pt Will Perform Upper Body Bathing: with supervision;sitting Pt Will Transfer to Toilet: with mod assist;ambulating;regular height toilet;bedside commode Pt Will Perform Toileting - Clothing Manipulation and hygiene: with min assist;sitting/lateral leans;sit to/from stand Additional ADL Goal #1: Pt will increase  bed mobility by transitioning from sidelying to sitting EOB with Min A with VC for maintaining back precautions in order to participate in self care task. Additional ADL Goal #2: Pt will demonstrate improved cognitive function by accurately orienting to person, place, and time; following two-step directions; and identifyinig their current situation and surroundings with min VC from therapist during structured activity.  Plan      Co-evaluation    PT/OT/SLP Co-Evaluation/Treatment: Yes Reason for Co-Treatment: Complexity of the patient's impairments (multi-system involvement);To address functional/ADL transfers;For patient/therapist safety   OT goals addressed during session: Strengthening/ROM;ADL's and self-care      AM-PAC OT "6 Clicks" Daily Activity     Outcome Measure   Help from another person eating meals?: Total Help from another person taking care of personal grooming?: Total Help from another person toileting, which includes using toliet, bedpan, or urinal?: Total Help from another person bathing (including washing, rinsing, drying)?: Total Help from another person to put on and taking off regular upper body clothing?: Total Help from another person to put on and taking  off regular lower body clothing?: Total 6 Click Score: 6    End of Session Equipment Utilized During Treatment: Oxygen;Back brace  OT Visit Diagnosis: Unsteadiness on feet (R26.81);Muscle weakness (generalized) (M62.81);History of falling (Z91.81);Other symptoms and signs involving cognitive function;Pain Pain - part of body:  (back)   Activity Tolerance Patient limited by lethargy   Patient Left in bed;with call bell/phone within reach;with bed alarm set;with SCD's reapplied;with restraints reapplied;with family/visitor present   Nurse Communication Mobility status        Time: 1610-9604 OT Time Calculation (min): 30 min  Charges: OT General Charges $OT Visit: 1 Visit OT Treatments $Therapeutic  Exercise: 8-22 mins  Cleven Jansma MSOT, OTR/L Acute Rehab Office: [phone]  Theodoro Grist Ayline Dingus 11/08/2023, 4:26 PM

## 2023-11-09 ENCOUNTER — Encounter: Payer: Self-pay | Admitting: Hematology and Oncology

## 2023-11-09 DIAGNOSIS — R569 Unspecified convulsions: Secondary | ICD-10-CM | POA: Diagnosis not present

## 2023-11-09 DIAGNOSIS — Z7189 Other specified counseling: Secondary | ICD-10-CM | POA: Diagnosis not present

## 2023-11-09 DIAGNOSIS — I609 Nontraumatic subarachnoid hemorrhage, unspecified: Secondary | ICD-10-CM | POA: Diagnosis not present

## 2023-11-09 DIAGNOSIS — Z515 Encounter for palliative care: Secondary | ICD-10-CM | POA: Diagnosis not present

## 2023-11-09 DIAGNOSIS — G9341 Metabolic encephalopathy: Secondary | ICD-10-CM | POA: Diagnosis not present

## 2023-11-09 LAB — GLUCOSE, CAPILLARY
Glucose-Capillary: 120 mg/dL — ABNORMAL HIGH (ref 70–99)
Glucose-Capillary: 125 mg/dL — ABNORMAL HIGH (ref 70–99)
Glucose-Capillary: 135 mg/dL — ABNORMAL HIGH (ref 70–99)
Glucose-Capillary: 154 mg/dL — ABNORMAL HIGH (ref 70–99)
Glucose-Capillary: 168 mg/dL — ABNORMAL HIGH (ref 70–99)
Glucose-Capillary: 191 mg/dL — ABNORMAL HIGH (ref 70–99)

## 2023-11-09 LAB — MAGNESIUM: Magnesium: 2.3 mg/dL (ref 1.7–2.4)

## 2023-11-09 LAB — CORTISOL: Cortisol, Plasma: 15.3 ug/dL

## 2023-11-09 MED ORDER — BANATROL TF EN LIQD
60.0000 mL | Freq: Two times a day (BID) | ENTERAL | Status: DC
  Administered 2023-11-09 – 2023-11-10 (×3): 60 mL
  Filled 2023-11-09 (×3): qty 60

## 2023-11-09 MED ORDER — MIDAZOLAM HCL 2 MG/2ML IJ SOLN
2.0000 mg | INTRAMUSCULAR | Status: DC | PRN
  Administered 2023-11-09 – 2023-11-10 (×13): 2 mg via INTRAVENOUS
  Filled 2023-11-09 (×13): qty 2

## 2023-11-09 MED ORDER — LOSARTAN POTASSIUM 50 MG PO TABS
100.0000 mg | ORAL_TABLET | Freq: Every day | ORAL | Status: DC
  Administered 2023-11-09 – 2023-11-10 (×2): 100 mg
  Filled 2023-11-09 (×2): qty 2

## 2023-11-09 MED ORDER — HYDRALAZINE HCL 25 MG PO TABS
25.0000 mg | ORAL_TABLET | Freq: Three times a day (TID) | ORAL | Status: DC
  Administered 2023-11-09 – 2023-11-10 (×3): 25 mg
  Filled 2023-11-09 (×3): qty 1

## 2023-11-09 NOTE — Progress Notes (Signed)
SLP Cancellation Note  Patient Details Name: [name] MRN: [id number] DOB: 1951/10/31   Cancelled treatment:       Reason Eval/Treat Not Completed: Patient not medically ready. Will continue following.    Gwynneth Aliment, M.A., CF-SLP Speech Language Pathology, Acute Rehabilitation Services  Secure Chat preferred [phone]  11/09/2023, 9:06 AM

## 2023-11-09 NOTE — Progress Notes (Signed)
   Palliative Medicine Inpatient Follow Up Note HPI: 72 yo female was walking and lost balance and fell backward. She complains of intense pain in her back. She takes eliquis for atrial fibrillation.    [name] has been asked to get involved to further support goals of care conversations.   Today's Discussion 11/09/2023  *Please note that this is a verbal dictation therefore any spelling or grammatical errors are due to the "Dragon Medical One" system interpretation.  Chart reviewed inclusive of vital signs, progress notes, laboratory results, and diagnostic images.   I met this morning with patients spouse, [name] and Kalana's sister. Prior to my arrival they had a conversation with Dr. Bedelia Person.   [name] shares with me the understanding that there is little more that can be offered medically. He shares that He and his daughter understand the severity of patients illness though they had been hopeful that the involvement of the neurology team may yield some answers.  We discussed moving forward what the goals would be. [name] shares that he and his daughter do not desire for patients suffering to be prolonged. We reviewed the idea of extubation - patients spouse shares that he and his daughter have determined that this will likely be pursued tomorrow morning. We discussed the importance of medications to support symptom burden at that time.   I shared it may be of utility to start calling loved ones to come and see Karie Schwalbe than later. [name] expresses that this is already in process.  Provided emotional support through therapeutic silence.   Questions and concerns addressed.  Objective Assessment: Vital Signs Vitals:   11/09/23 1213 11/09/23 1215  BP:    Pulse:  (!) 55  Resp:  18  Temp:    SpO2: 94% 92%    Intake/Output Summary (Last 24 hours) at 11/09/2023 1225 Last data filed at 11/09/2023 1200 Gross per 24 hour  Intake 2688.91 ml  Output 1815 ml  Net 873.91 ml   Last  Weight  Most recent update: 11/09/2023  6:19 AM    Weight  110.2 kg (242 lb 15.2 oz)            Gen:  Elderly Caucasian F chronically ill appearing HEENT:  coretrack, ETT, dry mucous membranes CV: Regular rate and irregular rhythm PULM: ON mechanical ventilator ABD: soft/nontender  EXT: (+) edema, (+) third spacing BLUE Neuro: Somnolent  SUMMARY OF RECOMMENDATIONS   DNAR --> Patients family share the would not want patient to have a tracheostomy  Patient family face decisions related to compassionate extubation --> Patients spouse is considering tomorrow morning  Ongoing PMT support  --> I will not be present tomorrow though I will request a team member follow up  MDM: High ______________________________________________________________________________________ Lamarr Lulas [place] Palliative Medicine Team Team Cell Phone: [phone] Please utilize secure chat with additional questions, if there is no response within 30 minutes please call the above phone number  Palliative Medicine Team providers are available by phone from 7am to 7pm daily and can be reached through the team cell phone.  Should this patient require assistance outside of these hours, please call the patient's attending physician.

## 2023-11-09 NOTE — Progress Notes (Addendum)
Pt BP per orders is for < 160 DSP is pulling MAP down d/t DSP being very low current 139/41 (58). TRN made aware of difficulty titrating Levo to keep MAP  > 65 and SBP < 160   Per Dr. Jessie Foot for MAP to remain low. Goal of SBP > 90

## 2023-11-09 NOTE — Telephone Encounter (Signed)
Telephone call  

## 2023-11-09 NOTE — Progress Notes (Signed)
Subjective: The patient is intubated, sedated and agitated.  Her husband is at the bedside.  Objective: Vital signs in last 24 hours: Temp:  [97.3 F (36.3 C)-99.7 F (37.6 C)] 97.5 F (36.4 C) (11/20 0800) Pulse Rate:  [53-87] 66 (11/20 0836) Resp:  [12-26] 18 (11/20 0836) BP: (83-220)/(38-103) 142/68 (11/20 0836) SpO2:  [81 %-100 %] 94 % (11/20 0836) Arterial Line BP: (127-205)/(36-81) 197/71 (11/20 0836) FiO2 (%):  [40 %] 40 % (11/20 0836) Weight:  [110.2 kg] 110.2 kg (11/20 0500) Estimated body mass index is 38.05 kg/m as calculated from the following:   Height as of this encounter: 5\' 7"  (1.702 m).   Weight as of this encounter: 110.2 kg.   Intake/Output from previous day: 11/19 0701 - 11/20 0700 In: 2735.2 [I.V.:1315.2; NG/GT:1320] Out: 1790 [Urine:1540; Stool:250] Intake/Output this shift: Total I/O In: 114.7 [I.V.:59.7; NG/GT:55] Out: 100 [Urine:100]  Physical exam patient is intubated and sedated.  She is moving all 4 extremities well.  Lab Results: Recent Labs    11/07/23 0515  WBC 11.5*  HGB 8.4*  HCT 28.2*  PLT 224   BMET Recent Labs    11/07/23 0515  NA 142  K 3.2*  CL 104  CO2 30  GLUCOSE 153*  BUN 34*  CREATININE 0.62  CALCIUM 8.7*    Studies/Results: Overnight EEG with video  Result Date: 11/09/2023 Charlsie Quest, MD     11/09/2023  6:26 AM Patient Name: [name] MRN: [id number] Epilepsy Attending: Charlsie Quest Referring Physician/Provider: Erick Blinks, MD Duration: 11/08/2023 11/09/2023 8295 Patient history: 72 y.o. female with left traumatic subarachnoid hemorrhage/contusion without significant mass effect. EEG to evaluate for seizure Level of alertness:  lethargic AEDs during EEG study: None Technical aspects: This EEG study was done with scalp electrodes positioned according to the 10-20 International system of electrode placement. Electrical activity was reviewed with band pass filter of 1-70Hz , sensitivity of 7  uV/mm, display speed of 64mm/sec with a 60Hz  notched filter applied as appropriate. EEG data were recorded continuously and digitally stored.  Video monitoring was available and reviewed as appropriate. Description: EEG showed continuous generalized 3 to 5 Hz theta-delta slowing. Hyperventilation and photic stimulation were not performed.   ABNORMALITY - Continuous slow, generalized IMPRESSION: This study is suggestive of moderate to severe diffuse encephalopathy. No seizures or epileptiform discharges were seen throughout the recording. Priyanka [name]    Assessment/Plan: T6 fracture: I discussed the situation with the patient's husband.  I have explained that this is a significant fracture in a bad location.  Surgery for this would be quite risky and fraught with complications and with a significant risk of failure.  I do not think she can tolerate the surgery presently.   The patient previously told me she did not want surgery, he does not want surgery nor do I want to do surgery if we can avoid it.  In general his questions.  We are all in agreement to continue with the TLSO.  Alcohol withdrawal, encephalopathy, respiratory failure: Noted  LOS: 13 days     Cristi Loron 11/09/2023, 9:03 AM

## 2023-11-09 NOTE — Progress Notes (Signed)
Trauma/Critical Care Follow Up Note  Subjective:    Overnight Issues:   Objective:  Vital signs for last 24 hours: Temp:  [97.5 F (36.4 C)-99.7 F (37.6 C)] 97.5 F (36.4 C) (11/20 1200) Pulse Rate:  [47-78] 55 (11/20 1215) Resp:  [14-20] 18 (11/20 1215) BP: (87-160)/(38-77) 134/59 (11/20 1202) SpO2:  [81 %-100 %] 92 % (11/20 1215) Arterial Line BP: (114-205)/(36-81) 114/36 (11/20 1215) FiO2 (%):  [40 %] 40 % (11/20 1213) Weight:  [110.2 kg] 110.2 kg (11/20 0500)  Hemodynamic parameters for last 24 hours:    Intake/Output from previous day: 11/19 0701 - 11/20 0700 In: 2735.2 [I.V.:1315.2; NG/GT:1320] Out: 1790 [Urine:1540; Stool:250]  Intake/Output this shift: Total I/O In: 525.5 [I.V.:250.5; NG/GT:275] Out: 425 [Urine:425]  Vent settings for last 24 hours: Vent Mode: PRVC FiO2 (%):  [40 %] 40 % Set Rate:  [18 bmp] 18 bmp Vt Set:  [490 mL] 490 mL PEEP:  [5 cmH20] 5 cmH20 Plateau Pressure:  [22 cmH20] 22 cmH20  Physical Exam:  Gen: comfortable, no distress Neuro: not following commands HEENT: PERRL Neck: supple CV: RRR Pulm: unlabored breathing on mechanical ventilation-full support Abd: soft, NT    GU: urine clear and yellow, +Foley Extr: wwp, no edema  Results for orders placed or performed during the hospital encounter of 11/05/2023 (from the past 24 hour(s))  Glucose, capillary     Status: Abnormal   Collection Time: 11/08/23  3:37 PM  Result Value Ref Range   Glucose-Capillary 135 (H) 70 - 99 mg/dL  Glucose, capillary     Status: Abnormal   Collection Time: 11/08/23  7:44 PM  Result Value Ref Range   Glucose-Capillary 152 (H) 70 - 99 mg/dL  Glucose, capillary     Status: Abnormal   Collection Time: 11/08/23 11:46 PM  Result Value Ref Range   Glucose-Capillary 158 (H) 70 - 99 mg/dL  Glucose, capillary     Status: Abnormal   Collection Time: 11/09/23  3:38 AM  Result Value Ref Range   Glucose-Capillary 168 (H) 70 - 99 mg/dL  Magnesium      Status: None   Collection Time: 11/09/23  5:20 AM  Result Value Ref Range   Magnesium 2.3 1.7 - 2.4 mg/dL  Cortisol     Status: None   Collection Time: 11/09/23  5:20 AM  Result Value Ref Range   Cortisol, Plasma 15.3 ug/dL  Glucose, capillary     Status: Abnormal   Collection Time: 11/09/23  8:16 AM  Result Value Ref Range   Glucose-Capillary 191 (H) 70 - 99 mg/dL  Glucose, capillary     Status: Abnormal   Collection Time: 11/09/23 11:32 AM  Result Value Ref Range   Glucose-Capillary 154 (H) 70 - 99 mg/dL    Assessment & Plan: The plan of care was discussed with the bedside nurse for the day, who is in agreement with this plan and no additional concerns were raised.   Present on Admission:  Closed unstable burst fracture of T5 vertebra (HCC)    LOS: 13 days   Additional comments:I reviewed the patient's new clinical lab test results.   and I reviewed the patients new imaging test results.    [name] s/p fall   SAH, IVH - NSGY c/s, Dr. Jake Samples, repeat head CT reviewed, keppra x7d AF on eliquis - rec'd Andexxa, hold for now Subacute T5 burst fx - MRI completed, Being managed pre-admission by Dr. Lovell Sheehan. Brace at bedside. Okay to ambulate, monitor for neuro  sx after ambulation. ETOH withdrawal, altered mental status - low dose phenobarb, now off, lethargy and decline in mentation resulted in intubation 11/15 PM. Remains not f/c, reversible causes addressed. Suspect Wernicke's encephalopathy. Neuro c/s 11/19, EEG negative for sz.  HTN - on home meds R 6th rib - pain control, pulm toilet  FEN- NPO, TF VTE- LMWH ID- resp cx with Strep, based on clinical exam, likely a part of normal flora Dispo- ICU   Clinical update provided to patient's husband and sister. Reiterated that all reversible etiologies of her neurologic state have been addressed and that her neurologic state is unlikely to recover in the short term or potentially at all. Husband is considering compassionate extubation  11/21.  Critical Care Total Time: 60 minutes  [name], MD Trauma & General Surgery Please use [email or web address] to contact on call provider  11/09/2023  *Care during the described time interval was provided by me. I have reviewed this patient's available data, including medical history, events of note, physical examination and test results as part of my evaluation.

## 2023-11-09 NOTE — Progress Notes (Signed)
NEUROLOGY CONSULT FOLLOW UP NOTE   Date of service: November 09, 2023 Patient Name: [name] MRN:  [id number] DOB:  07-15-1951  Brief HPI  [name] is a 72 y.o. female past medical history of known L4-L5 fracture and left wrist fracture, anxiety/depression, hypertension, hypothyroidism, atrial fibrillation on Eliquis, chronic pain, EtOH abuse with relapse this year, falls, breast cancer s/p lumpectomy and chemo, vitamin D deficiency who presented 11/7 via EMS from home status post fall on blood thinners.  Patient received Andexxa.  Patient denied LOC at the time.  Per EMS patient had orthostatic blood pressure changes.  Imaging revealed left traumatic subarachnoid hemorrhage/contusion without significant mass effect and traumatic t5 burst fracture.  Neurosurgery was consulted recommended no neurosurgical intervention at this time.  Patient was placed on Keppra 500 twice daily x 7 days for seizure prophylaxis. Husband has disclosed that patient is a heavy drinker and has been drinking more than usual   Significant Events: 11/8: Repeat CT showed small but increased volume of SAH.   11/11: Palliative care consulted for goals of care discussion.  Patient made DNR DNI.  11/13: Repeat CT head 11/13 showed decreased small volume of subarachnoid hemorrhage. coreTrak placed. Patient's code status changed to DNR only 11/16: Intubated due to decreased mental status and inability to protect airway. Placed on Pressors.  11/19: Neuro consulted. Placed on LTM EEG.   Interval Hx/subjective   No family at the bedside.  Patient is intubated and sedated on 200 mcg of fentanyl IV and Precedex at 1 mcg.  RN states that patient has been extremely agitated requiring increased sedation.  Patient is on LTM with continuous generalized slowing no seizures identified  Vitals   Vitals:   11/09/23 1045 11/09/23 1100 11/09/23 1115 11/09/23 1130  BP:  118/61  (!) 149/77  Pulse: (!) 47 (!) 56 (!) 59 62   Resp: 18 18 17 20   Temp:      TempSrc:      SpO2: 95% 94% 96% 96%  Weight:      Height:         Body mass index is 38.05 kg/m.  Physical Exam   Constitutional: Critically ill female, intubated and sedated Psych: Intubated Eyes: No scleral injection.  HENT: No OP obstrucion.  Head: Normocephalic.  Cardiovascular: Normal rate and regular rhythm.  Respiratory: Effort normal, non-labored breathing on ventilator GI: Soft.  No distension. There is no tenderness.  Skin: WDI.   Neurologic Examination   She is sedated on fentanyl and Precedex Eyes are closed.  She does not open eyes to loud voice, however does open eyes to sternal rub.  She does not follow commands.  Eyes are midline pupils are equal and reactive no gaze preference.  Positive cough and gag she does localize to pain with right upper extremity, left upper extremity she does move spontaneously appears to be less than the right.  Bilateral lower extremities she withdraws to pain spontaneously  Labs and Diagnostic Imaging   CBC:  Recent Labs  Lab 11/06/23 0514 11/07/23 0515  WBC 13.3* 11.5*  HGB 10.2* 8.4*  HCT 33.2* 28.2*  MCV 104.7* 110.6*  PLT 267 224    Basic Metabolic Panel:  Lab Results  Component Value Date   NA 142 11/07/2023   K 3.2 (L) 11/07/2023   CO2 30 11/07/2023   GLUCOSE 153 (H) 11/07/2023   BUN 34 (H) 11/07/2023   CREATININE 0.62 11/07/2023   CALCIUM 8.7 (L) 11/07/2023   GFRNONAA >  60 11/07/2023   GFRAA >60 09/02/2020   Lipid Panel:  Lab Results  Component Value Date   LDLCALC 140 (H) 07/21/2022   HgbA1c:  Lab Results  Component Value Date   HGBA1C 5.8 (H) 07/21/2022   Urine Drug Screen:     Component Value Date/Time   LABOPIA NONE DETECTED 12/12/2020 2041   COCAINSCRNUR NONE DETECTED 12/12/2020 2041   LABBENZ POSITIVE (A) 12/12/2020 2041   AMPHETMU NONE DETECTED 12/12/2020 2041   THCU NONE DETECTED 12/12/2020 2041   LABBARB NONE DETECTED 12/12/2020 2041    Alcohol Level      Component Value Date/Time   ETH <10 12/12/2020 2041   INR  Lab Results  Component Value Date   INR 0.95 07/11/2017   APTT No results found for: "APTT" AED levels: No results found for: "PHENYTOIN", "ZONISAMIDE", "LAMOTRIGINE", "LEVETIRACETA"  CT Head without contrast(Personally reviewed): Subarachnoid and subdural hemorrhage along the left middle cranial fossa subdural measuring approximately 4 mm Small amount of hyperdensity in fourth ventricle No hydrocephalus  CT cervical spine without contrast 11/7  No acute cervical spine fracture   CT T-spine 11/7 Acute T6 compression fracture with burst component   CT lumbar spine without contrast 11/7 : No acute lumbar spine fracture  Stable postsurgical site changes from L4-L5 discectomy and posterior fusion  MRI thoracic spine: Burst fracture at T6-7 if persistent 75% height loss and 7 mm of retropulsion that severely narrows spinal canal and impinges on the spinal cord. Mild T5 wedge compression fracture   MRI Brain(Personally reviewed): Acute blood products left middle cranial fossa No acute infarct Old left frontal infarcts Findings of chronic small vessel ischemia  Repeat CT Head without  contrast 11/8 Small increased volume of basilar cistern predominant subarachnoid hemorrhage   Repeat CT head without contrast 11/13: Decreased small volume of subarachnoid hemorrhage No evidence of new/interval acute abnormality  LTM  EEG 11/19-11/20 This study is suggestive of moderate to severe diffuse encephalopathy. No seizures or epileptiform discharges were seen throughout the recording.   Impression   [name] is a 72 y.o. female  female with past medical significant for known L4-L5 fracture and left wrist fracture, anxiety/depression, hypertension, hypothyroidism, atrial fibrillation on Eliquis, chronic pain, EtOH abuse with relapse this year, falls, breast cancer s/p lumpectomy and chemo, vitamin D deficiency who  presented 11/7 via EMS from home status post fall on blood thinners.  Patient received Andexxa. Imaging revealed left traumatic subarachnoid hemorrhage/contusion without significant mass effect and traumatic t5 burst fracture, no neurosurgical intervention at this time. Throughout hospital course, patient had increased anxiety and pain requiring multiple deliriogenic medications. Patient has declined neurologically and is not following commands. Subclinical seizures are a possibility and we will get LTM EEG running, pending AM read.   Recommendations  Delirium precautions  continue Thiamine supplementation  wean sedation as able avoid deliriogenic medications Continue LTM for now- will likely discontinue this afternoon  ______________________________________________________________________  Pt seen by Neuro NP/APP and later by MD. Note/plan to be edited by MD as needed.   Signed, Mathews Argyle, NP [facility]  NEUROHOSPITALIST ADDENDUM Performed a face to face diagnostic evaluation.   I have reviewed the contents of history and physical exam as documented by PA/ARNP/Resident and agree with above documentation.  I have discussed and formulated the above plan as documented. Edits to the note have been made as needed.  Impression/Key exam findings/Plan: sedation limits evaluation of other reversible causes of encephalopathy. Neuro exam confounded by sedation.  LTM discontinued in the afternoon, no seizures.  Erick Blinks, MD [facility] [phone]   If 7pm to 7am, please call on call as listed on AMION.

## 2023-11-09 NOTE — Plan of Care (Signed)
Patient admitted s/p fall. Upon initial assessment patient was found to have increased agitation and persistent bradycardia present. Precedex discontinued. PRN Versed given as needed per order. PRN Oxy and haldol given to assist with agitation, patient showed minimal signs of relief. Patient and family updated in plan of care. ICU status maintained.  Problem: Education: Goal: Knowledge of General Education information will improve Description: Including pain rating scale, medication(s)/side effects and non-pharmacologic comfort measures Outcome: Not Progressing   Problem: Health Behavior/Discharge Planning: Goal: Ability to manage health-related needs will improve Outcome: Not Progressing   Problem: Clinical Measurements: Goal: Ability to maintain clinical measurements within normal limits will improve Outcome: Not Progressing Goal: Will remain free from infection Outcome: Not Progressing Goal: Diagnostic test results will improve Outcome: Not Progressing Goal: Respiratory complications will improve Outcome: Not Progressing Goal: Cardiovascular complication will be avoided Outcome: Not Progressing   Problem: Activity: Goal: Risk for activity intolerance will decrease Outcome: Not Progressing   Problem: Nutrition: Goal: Adequate nutrition will be maintained Outcome: Not Progressing   Problem: Coping: Goal: Level of anxiety will decrease Outcome: Not Progressing   Problem: Elimination: Goal: Will not experience complications related to bowel motility Outcome: Not Progressing Goal: Will not experience complications related to urinary retention Outcome: Not Progressing   Problem: Pain Management: Goal: General experience of comfort will improve Outcome: Not Progressing   Problem: Safety: Goal: Ability to remain free from injury will improve Outcome: Not Progressing   Problem: Skin Integrity: Goal: Risk for impaired skin integrity will decrease Outcome: Not Progressing    Problem: Safety: Goal: Non-violent Restraint(s) Outcome: Not Progressing

## 2023-11-09 NOTE — Telephone Encounter (Signed)
3rd attempt to reach pt to schedule tele visit. No answer. Was able to leave a vm. Will remove from the pool until patient calls back

## 2023-11-09 NOTE — Procedures (Signed)
Patient Name: [name]  MRN: [id number]  Epilepsy Attending: Charlsie Quest  Referring Physician/Provider: Erick Blinks, MD  Duration: 11/08/2023 1307 11/09/2023 0102 1307  Patient history: 72 y.o. female with left traumatic subarachnoid hemorrhage/contusion without significant mass effect. EEG to evaluate for seizure  Level of alertness:  lethargic   AEDs during EEG study: None  Technical aspects: This EEG study was done with scalp electrodes positioned according to the 10-20 International system of electrode placement. Electrical activity was reviewed with band pass filter of 1-70Hz , sensitivity of 7 uV/mm, display speed of 36mm/sec with a 60Hz  notched filter applied as appropriate. EEG data were recorded continuously and digitally stored.  Video monitoring was available and reviewed as appropriate.  Description: EEG showed continuous generalized 3 to 5 Hz theta-delta slowing. Hyperventilation and photic stimulation were not performed.     ABNORMALITY - Continuous slow, generalized  IMPRESSION: This study is suggestive of moderate to severe diffuse encephalopathy. No seizures or epileptiform discharges were seen throughout the recording.  Francesco Provencal [name]

## 2023-11-09 NOTE — Progress Notes (Signed)
LTM EEG discontinued - no skin breakdown at [facility].LTM EEG discontinued - no skin breakdown at [facility].LTM EEG discontinued - no skin breakdown at [facility].

## 2023-11-09 NOTE — Progress Notes (Signed)
Nutrition Follow-up  DOCUMENTATION CODES:   Obesity unspecified  INTERVENTION:   Tube feeding via post pyloric Cortrak tube: Pivot 1.5 @ 55 ml/h (1320 ml per day)  Provides 1980 kcal, 123 gm protein, 1003 ml free water daily  Continue folic acid, thiamine, and MVI with minerals  Add banatrol TF BID  NUTRITION DIAGNOSIS:   Increased nutrient needs related to  (trauma) as evidenced by estimated needs. Ongoing.   GOAL:   Patient will meet greater than or equal to 90% of their needs Met with TF at goal  MONITOR:   TF tolerance  REASON FOR ASSESSMENT:   Consult Enteral/tube feeding initiation and management  ASSESSMENT:   Pt with PMH of ETOH abuse, AF on eliquis admitted after a fall with SAH, IVH, subacute T5 burst fx, and R 6th rib fx.   Pt discussed during ICU rounds and with RN and MD.  Palliative care and MD meeting with family, possible one-way extubation 11/21.    11/13 - s/p cortrak placement; tip in distal duodenum  11/15 - intubated  Medications reviewed and include: folic acid, levothyroxine, MVI with minerals, miralax, thiamine 100 mg daily Fentanyl   Labs reviewed:  K 3.2 Folate 17.5 Vitamin B 12 916 CBGs: 135-191  Diet Order:   Diet Order             Diet NPO time specified  Diet effective now                   EDUCATION NEEDS:   Not appropriate for education at this time  Skin:  Skin Assessment: Reviewed RN Assessment  Last BM:  250 ml via FMS  <- 650 ml <- 800 ml  Height:   Ht Readings from Last 1 Encounters:  11/04/23 5\' 7"  (1.702 m)    Weight:   Wt Readings from Last 1 Encounters:  11/09/23 110.2 kg    Ideal Body Weight:   61.3 kg   BMI:  Body mass index is 38.05 kg/m.  Estimated Nutritional Needs:   Kcal:  1900-2100  Protein:  105 - 125 grams  Fluid:  >1.9 L/day  Cammy Copa., RD, LDN, CNSC See AMiON for contact information

## 2023-11-10 DIAGNOSIS — S065XAA Traumatic subdural hemorrhage with loss of consciousness status unknown, initial encounter: Secondary | ICD-10-CM

## 2023-11-10 DIAGNOSIS — S22052A Unstable burst fracture of T5-T6 vertebra, initial encounter for closed fracture: Secondary | ICD-10-CM

## 2023-11-10 DIAGNOSIS — Z9911 Dependence on respirator [ventilator] status: Secondary | ICD-10-CM | POA: Diagnosis not present

## 2023-11-10 DIAGNOSIS — S22052S Unstable burst fracture of T5-T6 vertebra, sequela: Secondary | ICD-10-CM

## 2023-11-10 DIAGNOSIS — Z7189 Other specified counseling: Secondary | ICD-10-CM | POA: Diagnosis not present

## 2023-11-10 DIAGNOSIS — R569 Unspecified convulsions: Secondary | ICD-10-CM | POA: Diagnosis not present

## 2023-11-10 DIAGNOSIS — Z66 Do not resuscitate: Secondary | ICD-10-CM

## 2023-11-10 DIAGNOSIS — R4182 Altered mental status, unspecified: Secondary | ICD-10-CM

## 2023-11-10 LAB — GLUCOSE, CAPILLARY
Glucose-Capillary: 127 mg/dL — ABNORMAL HIGH (ref 70–99)
Glucose-Capillary: 144 mg/dL — ABNORMAL HIGH (ref 70–99)
Glucose-Capillary: 173 mg/dL — ABNORMAL HIGH (ref 70–99)

## 2023-11-10 MED ORDER — GLYCOPYRROLATE 1 MG PO TABS: 1.0000 mg | ORAL_TABLET | ORAL | Status: DC | PRN

## 2023-11-10 MED ORDER — BIOTENE DRY MOUTH MT LIQD: 15.0000 mL | Freq: Three times a day (TID) | OROMUCOSAL | Status: DC

## 2023-11-10 MED ORDER — GLYCOPYRROLATE 0.2 MG/ML IJ SOLN: 0.2000 mg | INTRAMUSCULAR | Status: DC | PRN

## 2023-11-10 MED ORDER — MIDAZOLAM HCL 2 MG/2ML IJ SOLN: 2.0000 mg | INTRAMUSCULAR | Status: DC | PRN

## 2023-11-10 MED ORDER — GLYCOPYRROLATE 0.2 MG/ML IJ SOLN
0.2000 mg | INTRAMUSCULAR | Status: DC | PRN
  Administered 2023-11-10: 0.2 mg via INTRAVENOUS
  Filled 2023-11-10: qty 1

## 2023-11-10 MED ORDER — MIDAZOLAM HCL 2 MG/2ML IJ SOLN
2.0000 mg | INTRAMUSCULAR | Status: DC | PRN
  Administered 2023-11-10: 2 mg via INTRAVENOUS
  Filled 2023-11-10: qty 2

## 2023-11-10 MED ORDER — FENTANYL BOLUS VIA INFUSION: 50.0000 ug | INTRAVENOUS | Status: DC | PRN

## 2023-11-10 MED ORDER — POLYVINYL ALCOHOL 1.4 % OP SOLN: 1.0000 [drp] | Freq: Four times a day (QID) | OPHTHALMIC | Status: DC | PRN

## 2023-11-11 LAB — VITAMIN B1: Vitamin B1 (Thiamine): 195.3 nmol/L (ref 66.5–200.0)

## 2023-11-20 NOTE — Progress Notes (Signed)
After discussion with patient's family (husband, daughter and sister), per their request, patient was one way extubated to comfort care. Family was at patient's side when she  died at 105. Palliative NP, Harvest Dark and I were present with the family and auscultated for heart sounds. Pt was asystole at 1300. Husband, Michielle Bochenek, was given business card to call [facility] [employer] once funeral home arrangements have been decided. [facility] and [facility] have been called and pt will be transported to morgue once post mortem care is complete. Delorice Bannister C 1:40 PM

## 2023-11-20 NOTE — Accreditation Note (Signed)
Restraint Death within 24 hours less than or equal to 2-point soft restraints logged on 11/11/23 at 1245 by Robb Matar RN, BSN.

## 2023-11-20 NOTE — Progress Notes (Signed)
Patient ID: [name], female   DOB: 1951-05-16, 72 y.o.   MRN: [id number] Follow up - Trauma Critical Care   Patient Details:    [name] is an 72 y.o. female.  Lines/tubes : Airway 7.5 mm (Active)  Secured at (cm) 25 cm 10/22/2023 0734  Measured From Lips 10/25/2023 0734  Secured Location Right 10/25/2023 0734  Secured By [employer] 11/09/2023 0734  Bite Block Yes 10/28/2023 0734  Tube Holder Repositioned Yes 10/26/2023 0734  Prone position No 11/09/23 1530  Cuff Pressure (cm H2O) MOV (Manual Technique) 10/29/2023 0734  Site Condition Dry 11/18/2023 0734     PICC Triple Lumen 11/06/23 Left Brachial 44 cm 0 cm (Active)  Indication for Insertion or Continuance of Line Prolonged intravenous therapies 11/09/23 2000  Exposed Catheter (cm) 0 cm 11/06/23 1900  Site Assessment Clean, Dry, Intact 11/09/23 2000  Lumen #1 Status Infusing 11/09/23 2000  Lumen #2 Status Infusing 11/09/23 2000  Lumen #3 Status In-line blood sampling system in place 11/09/23 2000  Dressing Type Securing device;Transparent 11/09/23 2000  Dressing Status Antimicrobial disc in place 11/09/23 2000  Line Care Connections checked and tightened 11/09/23 2000  Line Adjustment (NICU/IV Team Only) No 11/06/23 1143  Dressing Intervention New dressing;Adhesive placed at insertion site (IV team only);Adhesive placed around edges of dressing (IV team/ICU RN only) 11/06/23 1143  Dressing Change Due 11/13/23 11/09/23 2000     Arterial Line 11/08/23 Right Radial (Active)  Site Assessment Clean, Dry, Intact 11/09/23 2000  Line Status Pulsatile blood flow 11/09/23 2000  Art Line Waveform Appropriate 11/09/23 2000  Art Line Interventions Zeroed and calibrated 11/09/23 2000  Color/Movement/Sensation Capillary refill less than 3 sec 11/09/23 2000  Dressing Type Transparent 11/09/23 2000  Dressing Status Clean, Dry, Intact 11/09/23 2000  Interventions Antimicrobial disc changed;New dressing 11/09/23 0715  Dressing  Change Due 11/15/23 11/09/23 2000     Flatus Tube/Pouch (Active)  Daily care Skin around tube assessed 11/09/23 2000  Output (mL) 0 mL 11/09/23 1400  Intake (mL) 100 mL 11/08/23 1800     Urethral Catheter A Stanley RN Non-latex 14 Fr. (Active)  Indication for Insertion or Continuance of Catheter End of life comfort care 11/12/2023 0800  Site Assessment Clean, Dry, Intact 11/06/2023 0800  Catheter Maintenance Bag below level of bladder;Catheter secured;Drainage bag/tubing not touching floor;Insertion date on drainage bag;No dependent loops;Seal intact 11/06/2023 0800  Collection Container Standard drainage bag 10/23/2023 0800  Securement Method Adhesive securement device 11/06/2023 0800  Urinary Catheter Interventions (if applicable) Unclamped 11/09/23 2000  Output (mL) 155 mL 11/07/2023 0600    Microbiology/Sepsis markers: Results for orders placed or performed during the hospital encounter of 11/17/2023  Resp panel by RT-PCR (RSV, Flu A&B, Covid) Anterior Nasal Swab     Status: None   Collection Time: 11/06/2023  4:12 PM   Specimen: Anterior Nasal Swab  Result Value Ref Range Status   SARS Coronavirus 2 by RT PCR NEGATIVE NEGATIVE Final   Influenza A by PCR NEGATIVE NEGATIVE Final   Influenza B by PCR NEGATIVE NEGATIVE Final    Comment: (NOTE) The Xpert Xpress SARS-CoV-2/FLU/RSV plus assay is intended as an aid in the diagnosis of influenza from Nasopharyngeal swab specimens and should not be used as a sole basis for treatment. Nasal washings and aspirates are unacceptable for Xpert Xpress SARS-CoV-2/FLU/RSV testing.  Fact Sheet for Patients: BloggerCourse.com  Fact Sheet for Healthcare Providers: SeriousBroker.it  This test is not yet approved or cleared by the [place]  FDA and has been authorized for detection and/or diagnosis of SARS-CoV-2 by FDA under an Emergency Use Authorization (EUA). This EUA will remain in effect (meaning this  test can be used) for the duration of the COVID-19 declaration under Section 564(b)(1) of the Act, 21 U.S.C. section 360bbb-3(b)(1), unless the authorization is terminated or revoked.     Resp Syncytial Virus by PCR NEGATIVE NEGATIVE Final    Comment: (NOTE) Fact Sheet for Patients: BloggerCourse.com  Fact Sheet for Healthcare Providers: SeriousBroker.it  This test is not yet approved or cleared by the [place] FDA and has been authorized for detection and/or diagnosis of SARS-CoV-2 by FDA under an Emergency Use Authorization (EUA). This EUA will remain in effect (meaning this test can be used) for the duration of the COVID-19 declaration under Section 564(b)(1) of the Act, 21 U.S.C. section 360bbb-3(b)(1), unless the authorization is terminated or revoked.  Performed at [facility], [address], [place], [place] [address]   MRSA Next Gen by PCR, Nasal     Status: None   Collection Time: 11/18/2023  9:27 PM   Specimen: Nasal Mucosa; Nasal Swab  Result Value Ref Range Status   MRSA by PCR Next Gen NOT DETECTED NOT DETECTED Final    Comment: (NOTE) The GeneXpert MRSA Assay (FDA approved for NASAL specimens only), is one component of a comprehensive MRSA colonization surveillance program. It is not intended to diagnose MRSA infection nor to guide or monitor treatment for MRSA infections. Test performance is not FDA approved in patients less than 76 years old. Performed at [facility], [address] [place], [place] [address]   Culture, Respiratory w Gram Stain     Status: None   Collection Time: 11/06/23 10:37 AM   Specimen: Tracheal Aspirate; Respiratory  Result Value Ref Range Status   Specimen Description TRACHEAL ASPIRATE  Final   Special Requests NONE  Final   Gram Stain   Final    FEW WBC PRESENT, PREDOMINANTLY PMN RARE GRAM POSITIVE COCCI    Culture   Final    MODERATE STREPTOCOCCUS GROUP  F Beta hemolytic streptococci are predictably susceptible to penicillin and other beta lactams. Susceptibility testing not routinely performed. Performed at [facility], [address], [place], [place] [address]    Report Status 11/08/2023 FINAL  Final    Anti-infectives:  Anti-infectives (From admission, onward)    None        Consults: Treatment Team:  Tressie Stalker, MD Mariel Craft, MD    Studies:    Events:  Subjective:    Overnight Issues: needing a lot of PRNs for sedation  Objective:  Vital signs for last 24 hours: Temp:  [97.5 F (36.4 C)-99.6 F (37.6 C)] 99.6 F (37.6 C) (11/21 0800) Pulse Rate:  [47-86] 83 (11/21 0734) Resp:  [11-23] 20 (11/21 0734) BP: (90-166)/(41-89) 91/41 (11/21 0630) SpO2:  [91 %-100 %] 97 % (11/21 0734) Arterial Line BP: (80-209)/(28-82) 85/31 (11/21 0630) FiO2 (%):  [40 %] 40 % (11/21 0734) Weight:  [105.9 kg] 105.9 kg (11/21 0500)  Hemodynamic parameters for last 24 hours:    Intake/Output from previous day: 11/20 0701 - 11/21 0700 In: 2301.5 [I.V.:656.5; NG/GT:1645] Out: 1400 [Urine:1350; Stool:50]  Intake/Output this shift: No intake/output data recorded.  Vent settings for last 24 hours: Vent Mode: PRVC FiO2 (%):  [40 %] 40 % Set Rate:  [18 bmp] 18 bmp Vt Set:  [490 mL] 490 mL PEEP:  [5 cmH20]  5 cmH20 Plateau Pressure:  [16 cmH20-24 cmH20] 24 cmH20  Physical Exam:  General: on vent Neuro: agitated on vent, not F/C HEENT/Neck: ETT Resp: CTA CVS: RRR GI: soft, NT, ND Extremities: some edema  Results for orders placed or performed during the hospital encounter of 11/09/2023 (from the past 24 hour(s))  Glucose, capillary     Status: Abnormal   Collection Time: 11/09/23 11:32 AM  Result Value Ref Range   Glucose-Capillary 154 (H) 70 - 99 mg/dL  Glucose, capillary     Status: Abnormal   Collection Time: 11/09/23  3:30 PM  Result Value Ref Range   Glucose-Capillary 120 (H) 70 - 99 mg/dL   Glucose, capillary     Status: Abnormal   Collection Time: 11/09/23  7:44 PM  Result Value Ref Range   Glucose-Capillary 135 (H) 70 - 99 mg/dL  Glucose, capillary     Status: Abnormal   Collection Time: 11/09/23 11:48 PM  Result Value Ref Range   Glucose-Capillary 125 (H) 70 - 99 mg/dL  Glucose, capillary     Status: Abnormal   Collection Time: 11/04/2023  3:33 AM  Result Value Ref Range   Glucose-Capillary 127 (H) 70 - 99 mg/dL  Glucose, capillary     Status: Abnormal   Collection Time: 11/07/2023  7:10 AM  Result Value Ref Range   Glucose-Capillary 144 (H) 70 - 99 mg/dL    Assessment & Plan: Present on Admission:  Closed unstable burst fracture of T5 vertebra (HCC)    LOS: 14 days   Additional comments:I reviewed the patient's new clinical lab test results. / 34F s/p fall   SAH, IVH - NSGY c/s, Dr. Jake Samples, repeat head CT reviewed, keppra x7d AF on eliquis - rec'd Andexxa, hold for now Subacute T5 burst fx - MRI completed, Being managed pre-admission by Dr. Lovell Sheehan. Brace at bedside. Okay to ambulate, monitor for neuro sx after ambulation. ETOH withdrawal, altered mental status - low dose phenobarb, now off, lethargy and decline in mentation resulted in intubation 11/15 PM. Remains not f/c, reversible causes addressed. Suspect Wernicke's encephalopathy. Neuro c/s 11/19, EEG negative for sz.  HTN - on home meds R 6th rib - pain control, pulm toilet  FEN- NPO, TF VTE- LMWH ID- resp cx with Strep, based on clinical exam, likely a part of normal flora Dispo- ICU I spoke with her sister at the bedside, Family is considering compassionate extubation. If they do not proceed with this, will need to adjust sedation further. Critical Care Total Time*: 35 Minutes  Isabel Gelinas, MD, MPH, FACS Trauma & General Surgery Use [email or web address] to contact on call provider  11/05/2023  *Care during the described time interval was provided by me. I have reviewed this patient's available data,  including medical history, events of note, physical examination and test results as part of my evaluation.

## 2023-11-20 NOTE — Procedures (Signed)
Patient Name: [name]  MRN: [id number]  Epilepsy Attending: Charlsie Quest  Referring Physician/Provider: Erick Blinks, MD  Duration: 11/09/2023 1307 11/09/2023 [id number]   Patient history: 72 y.o. female with left traumatic subarachnoid hemorrhage/contusion without significant mass effect. EEG to evaluate for seizure   Level of alertness:  lethargic    AEDs during EEG study: None   Technical aspects: This EEG study was done with scalp electrodes positioned according to the 10-20 International system of electrode placement. Electrical activity was reviewed with band pass filter of 1-70Hz , sensitivity of 7 uV/mm, display speed of 48mm/sec with a 60Hz  notched filter applied as appropriate. EEG data were recorded continuously and digitally stored.  Video monitoring was available and reviewed as appropriate.   Description: EEG showed continuous generalized 3 to 5 Hz theta-delta slowing. Hyperventilation and photic stimulation were not performed.      ABNORMALITY - Continuous slow, generalized   IMPRESSION: This study is suggestive of moderate to severe diffuse encephalopathy. No seizures or epileptiform discharges were seen throughout the recording.   Pedro Whiters [name]

## 2023-11-20 NOTE — Death Summary Note (Signed)
DEATH SUMMARY   Patient Details  Name: [name] MRN: [id number] DOB: 04-08-1951  Admission/Discharge Information   Admit Date:  2023-11-25  Date of Death: Date of Death: 09-Dec-2023  Time of Death: Time of Death: 1300  Length of Stay: 01-Jan-2024  Referring Physician: Rebecka Apley, NP   Reason(s) for Hospitalization  Fall  Diagnoses  Preliminary cause of death:  Secondary Diagnoses (including complications and co-morbidities):  Principal Problem:   Closed unstable burst fracture of T5 vertebra (HCC) Active Problems:   Agitation states as acute reaction to exceptional (gross) stress Acute alcohol withdrawal Traumatic brain injury with subarachnoid hemorrhage  Brief Hospital Course (including significant findings, care, treatment, and services provided and events leading to death)  [name] is a 72 y.o. year old female who takes Eliquis for atrial fibrillation was walking and lost her balance and fell backwards.  Workup in the emergency department demonstrated subarachnoid hemorrhage.  She also had a T5 burst fracture which was from a previous fall and was being managed as an outpatient by Dr. Lovell Sheehan.  Additionally, right sixth rib fracture was noted.  Her anticoagulation was reversed with Andexxa.  She was admitted to the trauma service, ICU.  Neurosurgery was consulted.  Nonoperative management of her T5 burst fracture was selected and her brace was brought in from home by her husband.  Traumatic brain injury therapies were begun.  She developed increasing agitation and delirium.  It was discovered that she has a history of significant alcohol consumption.  She was treated for delirium and acute alcohol withdrawal.  Her respiratory status worsened requiring intermittent BiPAP.  In light of her ongoing mental status, palliative care was consulted for discussion of long-term goals.  Therapies are recommending skilled nursing placement.  Discussions were began with her family.   Unfortunately, her respiratory status continued to decline requiring intubation 11/15.  Delirium and agitation persisted.  Psychiatry was consulted as well.  Multiple medications were used to control this.  Ongoing discussions with her family were had as a became clear she would require tracheostomy to liberate from the ventilator.  The family ultimately decided on compassionate terminal extubation.  She passed away very quickly.    Pertinent Labs and Studies  Significant Diagnostic Studies Overnight EEG with video  Result Date: 11/09/2023 Charlsie Quest, MD     09-Dec-2023  9:28 AM Patient Name: [name] MRN: [id number] Epilepsy Attending: Charlsie Quest Referring Physician/Provider: Erick Blinks, MD Duration: 11/08/2023 1307 11/09/2023 [id number] Patient history: 72 y.o. female with left traumatic subarachnoid hemorrhage/contusion without significant mass effect. EEG to evaluate for seizure Level of alertness:  lethargic AEDs during EEG study: None Technical aspects: This EEG study was done with scalp electrodes positioned according to the 10-20 International system of electrode placement. Electrical activity was reviewed with band pass filter of 1-70Hz , sensitivity of 7 uV/mm, display speed of 47mm/sec with a 60Hz  notched filter applied as appropriate. EEG data were recorded continuously and digitally stored.  Video monitoring was available and reviewed as appropriate. Description: EEG showed continuous generalized 3 to 5 Hz theta-delta slowing. Hyperventilation and photic stimulation were not performed.   ABNORMALITY - Continuous slow, generalized IMPRESSION: This study is suggestive of moderate to severe diffuse encephalopathy. No seizures or epileptiform discharges were seen throughout the recording. Charlsie Quest   [place] EKG SITE RITE  Result Date: 11/06/2023 If Site Rite image not attached, placement could not be confirmed due to current cardiac rhythm.  DG CHEST  PORT 1  VIEW  Result Date: 11/04/2023 CLINICAL DATA:  ET tube placement EXAM: PORTABLE CHEST 1 VIEW COMPARISON:  11/02/2023 FINDINGS: Endotracheal tube is 3.5 cm above the carina. Feeding tube seen entering the stomach. Bilateral perihilar and lower lobe airspace opacities, worsening since prior study. Heart borderline in size. No acute bony abnormality. IMPRESSION: Endotracheal tube 3.5 cm above the carina. Worsening bilateral perihilar and lower lobe airspace opacities, favor edema. Electronically Signed   By: Charlett Nose M.D.   On: 11/04/2023 22:32   CT HEAD WO CONTRAST ( )  Result Date: 11/02/2023 CLINICAL DATA:  Head trauma, moderate-severe EXAM: CT HEAD WITHOUT CONTRAST TECHNIQUE: Contiguous axial images were obtained from the base of the skull through the vertex without intravenous contrast. RADIATION DOSE REDUCTION: This exam was performed according to the departmental dose-optimization program which includes automated exposure control, adjustment of the mA and/or kV according to patient size and/or use of iterative reconstruction technique. COMPARISON:  CT head October 28, 2023. FINDINGS: Motion limited study.  Within this limitation: Brain: Decreased small volume subarachnoid hemorrhage which is visible long the anterior left temporal convexity (series 4, image 49; series 2, image 16). No evidence of new hemorrhage, acute large vascular territory infarct, midline shift or hydrocephalus. Vascular: No hyperdense vessel. Skull: No acute fracture. Sinuses/Orbits: Clear sinuses.  No acute orbital findings. IMPRESSION: Decreased small volume of subarachnoid hemorrhage. No evidence of new/interval acute abnormality. Electronically Signed   By: Feliberto Harts M.D.   On: 11/02/2023 21:47   DG CHEST PORT 1 VIEW  Result Date: 11/02/2023 CLINICAL DATA:  Oxygen desaturation. EXAM: PORTABLE CHEST 1 VIEW COMPARISON:  October 30, 2023. FINDINGS: Stable cardiomegaly. Feeding tube is seen entering stomach.  Status post left shoulder arthroplasty. Bibasilar opacities are noted concerning for atelectasis or infiltrates with probable associated pleural effusions. IMPRESSION: Bibasilar opacities concerning for atelectasis or infiltrates with possible pleural effusions. Electronically Signed   By: Lupita Raider M.D.   On: 11/02/2023 17:30   DG Abd Portable 1V  Result Date: 11/02/2023 CLINICAL DATA:  Feeding tube placement. EXAM: PORTABLE ABDOMEN - 1 VIEW COMPARISON:  CT 2023/11/13 FINDINGS: The weighted enteric tube courses into the right abdomen, tip in the left mid abdomen in the region of the distal duodenum. No bowel dilatation to suggest obstruction. IMPRESSION: Weighted enteric tube tip in the region of the distal duodenum. Electronically Signed   By: Narda Rutherford M.D.   On: 11/02/2023 10:13   DG CHEST PORT 1 VIEW  Result Date: 10/30/2023 CLINICAL DATA:  [id number] SOB (shortness of breath) [id number] EXAM: PORTABLE CHEST 1 VIEW COMPARISON:  Chest x-ray 11/13/2023 FINDINGS: The heart and mediastinal contours are unchanged. Atherosclerotic plaque. Interval development of diffuse interstitial airspace opacities, right greater than left, with as well development of patchy right mid to lower lung zone airspace opacities. No pleural effusion. No pneumothorax. No acute osseous abnormality. Reversed total left shoulder arthroplasty. IMPRESSION: Interval development of diffuse interstitial airspace opacities, right greater than left, with as well development of patchy right mid to lower lung zone airspace opacities. Followup PA and lateral chest X-ray is recommended in 3-4 weeks following therapy to ensure resolution and exclude underlying malignancy. Electronically Signed   By: Tish Frederickson M.D.   On: 10/30/2023 17:55   CT HEAD WO CONTRAST ( )  Result Date: 10/28/2023 CLINICAL DATA:  72 year old female with intracranial hemorrhage following fall on blood thinners. T6 burst fracture. EXAM: CT HEAD WITHOUT  CONTRAST TECHNIQUE: Contiguous axial images were obtained from the  base of the skull through the vertex without intravenous contrast. RADIATION DOSE REDUCTION: This exam was performed according to the departmental dose-optimization program which includes automated exposure control, adjustment of the mA and/or kV according to patient size and/or use of iterative reconstruction technique. COMPARISON:  Head CT and brain MRI yesterday. FINDINGS: Brain: Small volume subarachnoid hemorrhage in the left middle cranial fossa along the left MCA vessels is redemonstrated. Questionable component of small subdural there is well but less apparent now on series 4, image 24. Subarachnoid hemorrhage redemonstrated in the suprasellar cistern and increased since the CT yesterday, present on the MRI. Blood volume overall is small. Asymmetric involvement of the left ambient cistern again noted. Trace 4th ventricle IVH remains possible, and trace lateral IVH better demonstrated on SWI MRI. No midline shift or intracranial mass effect. No ventriculomegaly. Stable gray-white matter differentiation throughout the brain. No cortically based acute infarct identified. Vascular: Calcified atherosclerosis at the skull base. Skull: Minor motion artifact.  No skull fracture is identified. Sinuses/Orbits: Visualized paranasal sinuses and mastoids are stable and well aerated. Other: Vertex scalp hematoma is more apparent on series 3 image 81. No scalp soft tissue gas. Orbits soft tissues appears stable. IMPRESSION: 1. Small but increased volume of basilar cistern predominant Subarachnoid Hemorrhage, asymmetric to the left. Although posttraumatic etiology on blood thinners is possible, recommend either CTA or MRA of the intracranial circulation to exclude intracranial aneurysm. 2. Trace IVH better demonstrated on MRI. No intracranial mass effect, ventriculomegaly, or other complicating features. 3. Vertex scalp hematoma. No skull fracture  identified. Electronically Signed   By: Odessa Fleming M.D.   On: 10/28/2023 05:10   MR THORACIC SPINE WO CONTRAST  Result Date: 10/28/2023 CLINICAL DATA:  T6 fracture EXAM: MRI THORACIC SPINE WITHOUT CONTRAST TECHNIQUE: Multiplanar, multisequence MR imaging of the thoracic spine was performed. No intravenous contrast was administered. COMPARISON:  None Available. FINDINGS: Alignment:  Physiologic. Vertebrae: There is a burst fracture at T6 with 75% height loss and 7 mm of retropulsion that severely narrows the spinal canal and impinges on the spinal cord. Mild T5 compression deformity with less than 25% height loss. Cord: Hyperintense T2-weighted signal within the spinal cord at the T5-7 levels. Paraspinal and other soft tissues: Negative. Disc levels: Severe spinal canal stenosis at T6 with impingement of the spinal cord, as above. Otherwise, there is no spinal canal stenosis. IMPRESSION: 1. Burst fracture at T6 with 75% height loss and 7 mm of retropulsion that severely narrows the spinal canal and impinges on the spinal cord. 2. Hyperintense T2-weighted signal within the spinal cord at the T5-7 levels, consistent with compressive myelopathy. 3. Mild T5 wedge compression fracture Electronically Signed   By: Deatra Robinson M.D.   On: 10/28/2023 00:21   MR BRAIN WO CONTRAST  Result Date: 10/28/2023 CLINICAL DATA:  Head trauma with abnormal head CT EXAM: MRI HEAD WITHOUT CONTRAST TECHNIQUE: Multiplanar, multiecho pulse sequences of the brain and surrounding structures were obtained without intravenous contrast. COMPARISON:  Head CT 11/17/2023 FINDINGS: Brain: There is no acute infarct. Acute blood products of the left middle cranial fossa, better seen on earlier head CT. Old left frontal infarct. Chronic microhemorrhage in the cerebellum. There is multifocal hyperintense T2-weighted signal within the white matter. Generalized volume loss. The midline structures are normal. Vascular: Normal flow voids. Skull and  upper cervical spine: Normal marrow signal. Sinuses/Orbits: Negative. Other: None. IMPRESSION: 1. Acute blood products of the left middle cranial fossa, better seen on earlier head CT. 2.  No acute infarct. 3. Old left frontal infarct and findings of chronic small vessel ischemia. 4. No new area of acute hemorrhage identified. Electronically Signed   By: Deatra Robinson M.D.   On: 10/28/2023 00:17   CT T-SPINE NO CHARGE  Result Date: 19-Nov-2023 CLINICAL DATA:  Larey Seat backwards, hit head, back pain EXAM: CT Thoracic and Lumbar spine with contrast TECHNIQUE: Multiplanar CT images of the thoracic and lumbar spine were reconstructed from contemporary CT of the Chest, Abdomen, and Pelvis. RADIATION DOSE REDUCTION: This exam was performed according to the departmental dose-optimization program which includes automated exposure control, adjustment of the mA and/or kV according to patient size and/or use of iterative reconstruction technique. CONTRAST:  No additional COMPARISON:  04/08/2022, 07/08/2022, 11/17/2022 FINDINGS: CT THORACIC SPINE FINDINGS Alignment: Exaggerated kyphosis at a T6 compression fracture. Otherwise alignment is anatomic. Vertebrae: There is an acute compression fracture at the T6 level, with greater than 75% loss of vertebral body height. Burst component of the fracture, the burst type fracture with retropulsion, with the posterior aspect of the vertebral body protruding 0.7 cm at the central canal. Additionally, there is a transverse comminuted fracture through the left T6 facet joint, with no evidence of dislocation. There is invagination of the superior endplate of the T5 vertebral body, with less than 25% loss of height, also suspicious for an acute compression fracture. No retropulsion or extension to the posterior elements. Bones are osteopenic.  No other acute thoracic spine fractures. There are acute fractures of the right fifth and sixth ribs at the costovertebral junction, as well as a  minimally displaced right posterolateral sixth rib fracture. Paraspinal and other soft tissues: Prominent paraspinal soft tissue swelling from T5 through T7 associated with the acute thoracic spine fractures. No evidence of central canal hematoma. Trace right pleural effusion.  No evidence of pneumothorax. Disc levels: As discussed above, there is approximately 50% narrowing of the central canal at the T6 level due to burst component of the T6 compression fracture. Further evaluation with MRI may be useful. There is mild spondylosis within the thoracic spine. No other bony encroachment upon the central canal or neural foramina. CT LUMBAR SPINE FINDINGS Segmentation: 5 lumbar type vertebrae. Alignment: Alignment is grossly anatomic. Vertebrae: No acute lumbar spine fractures. No destructive bony abnormalities. Stable postsurgical changes from discectomy and posterior fusion at the L4-5 level. Paraspinal and other soft tissues: Paraspinal soft tissues are unremarkable. Please refer to separately reported CT abdomen and pelvis exam for findings in those regions. Disc levels: Stable postsurgical changes from discectomy and posterior fusion at the L4-5 level. Streak artifact limits evaluation at those levels. Spondylosis and facet hypertrophy from L1-2 through L3-4 again noted and not appreciably changed, with findings most pronounced at the L3-4 level with central canal stenosis and left greater than right neural foraminal encroachment. Mild central disc bulge at L5-S1 is stable. Reconstructed images demonstrate no additional findings. IMPRESSION: 1. Acute T6 compression fracture, with greater than 75% loss of height. Fracture line extends through the left T6 facet, without evidence of dislocation or subluxation. Burst component of the T6 compression fracture, with retropulsion extending 0.7 cm into the central canal resulting in at least 50% narrowing and likely underlying cord compression. Further evaluation with MRI  recommended. 2. Acute compression deformity superior endplate T5 vertebral body, with less than 25% loss of height. No retropulsion or extension to the posterior elements. 3. No acute lumbar spine fracture. 4. Multilevel thoracolumbar spondylosis and facet hypertrophy as above. 5. Stable  postsurgical changes from L4-5 discectomy and posterior fusion. Critical Value/emergent results were called by telephone at the time of interpretation on 10/26/2023 at 6:18 pm to provider [facility] , who verbally acknowledged these results. Electronically Signed   By: Sharlet Salina M.D.   On: 11/02/2023 18:21   CT L-SPINE NO CHARGE  Result Date: 10/21/2023 CLINICAL DATA:  Larey Seat backwards, hit head, back pain EXAM: CT Thoracic and Lumbar spine with contrast TECHNIQUE: Multiplanar CT images of the thoracic and lumbar spine were reconstructed from contemporary CT of the Chest, Abdomen, and Pelvis. RADIATION DOSE REDUCTION: This exam was performed according to the departmental dose-optimization program which includes automated exposure control, adjustment of the mA and/or kV according to patient size and/or use of iterative reconstruction technique. CONTRAST:  No additional COMPARISON:  04/08/2022, 07/08/2022, 11/17/2022 FINDINGS: CT THORACIC SPINE FINDINGS Alignment: Exaggerated kyphosis at a T6 compression fracture. Otherwise alignment is anatomic. Vertebrae: There is an acute compression fracture at the T6 level, with greater than 75% loss of vertebral body height. Burst component of the fracture, the burst type fracture with retropulsion, with the posterior aspect of the vertebral body protruding 0.7 cm at the central canal. Additionally, there is a transverse comminuted fracture through the left T6 facet joint, with no evidence of dislocation. There is invagination of the superior endplate of the T5 vertebral body, with less than 25% loss of height, also suspicious for an acute compression fracture. No retropulsion or  extension to the posterior elements. Bones are osteopenic.  No other acute thoracic spine fractures. There are acute fractures of the right fifth and sixth ribs at the costovertebral junction, as well as a minimally displaced right posterolateral sixth rib fracture. Paraspinal and other soft tissues: Prominent paraspinal soft tissue swelling from T5 through T7 associated with the acute thoracic spine fractures. No evidence of central canal hematoma. Trace right pleural effusion.  No evidence of pneumothorax. Disc levels: As discussed above, there is approximately 50% narrowing of the central canal at the T6 level due to burst component of the T6 compression fracture. Further evaluation with MRI may be useful. There is mild spondylosis within the thoracic spine. No other bony encroachment upon the central canal or neural foramina. CT LUMBAR SPINE FINDINGS Segmentation: 5 lumbar type vertebrae. Alignment: Alignment is grossly anatomic. Vertebrae: No acute lumbar spine fractures. No destructive bony abnormalities. Stable postsurgical changes from discectomy and posterior fusion at the L4-5 level. Paraspinal and other soft tissues: Paraspinal soft tissues are unremarkable. Please refer to separately reported CT abdomen and pelvis exam for findings in those regions. Disc levels: Stable postsurgical changes from discectomy and posterior fusion at the L4-5 level. Streak artifact limits evaluation at those levels. Spondylosis and facet hypertrophy from L1-2 through L3-4 again noted and not appreciably changed, with findings most pronounced at the L3-4 level with central canal stenosis and left greater than right neural foraminal encroachment. Mild central disc bulge at L5-S1 is stable. Reconstructed images demonstrate no additional findings. IMPRESSION: 1. Acute T6 compression fracture, with greater than 75% loss of height. Fracture line extends through the left T6 facet, without evidence of dislocation or subluxation.  Burst component of the T6 compression fracture, with retropulsion extending 0.7 cm into the central canal resulting in at least 50% narrowing and likely underlying cord compression. Further evaluation with MRI recommended. 2. Acute compression deformity superior endplate T5 vertebral body, with less than 25% loss of height. No retropulsion or extension to the posterior elements. 3. No acute lumbar spine  fracture. 4. Multilevel thoracolumbar spondylosis and facet hypertrophy as above. 5. Stable postsurgical changes from L4-5 discectomy and posterior fusion. Critical Value/emergent results were called by telephone at the time of interpretation on 11/14/23 at 6:18 pm to provider [facility] , who verbally acknowledged these results. Electronically Signed   By: Sharlet Salina M.D.   On: 11-14-2023 18:21   CT CERVICAL SPINE WO CONTRAST  Result Date: 2023/11/14 CLINICAL DATA:  Larey Seat, anticoagulated, hit head, back pain EXAM: CT CERVICAL SPINE WITHOUT CONTRAST TECHNIQUE: Multidetector CT imaging of the cervical spine was performed without intravenous contrast. Multiplanar CT image reconstructions were also generated. RADIATION DOSE REDUCTION: This exam was performed according to the departmental dose-optimization program which includes automated exposure control, adjustment of the mA and/or kV according to patient size and/or use of iterative reconstruction technique. COMPARISON:  12/12/2020 FINDINGS: The study is slightly limited by patient motion during the exam. Alignment: Alignment is grossly anatomic. Skull base and vertebrae: No acute fracture. No primary bone lesion or focal pathologic process. Soft tissues and spinal canal: No prevertebral fluid or swelling. No visible canal hematoma. Disc levels: Mild multilevel spondylosis from C3-4 through C6-7. Mild right neural foraminal encroachment at C5-6. Upper chest: Airway is patent. Emphysematous changes are seen at the lung apices. Other: Reconstructed  images demonstrate no additional findings. IMPRESSION: 1. No acute cervical spine fracture. 2. Mild cervical spondylosis greatest at C5-6. Electronically Signed   By: Sharlet Salina M.D.   On: 11-14-23 18:05   CT CHEST ABDOMEN PELVIS W CONTRAST  Result Date: 2023/11/14 CLINICAL DATA:  Larey Seat backwards and hit her head. Patient on blood thinners. EXAM: CT CHEST, ABDOMEN, AND PELVIS WITH CONTRAST TECHNIQUE: Multidetector CT imaging of the chest, abdomen and pelvis was performed following the standard protocol during bolus administration of intravenous contrast. RADIATION DOSE REDUCTION: This exam was performed according to the departmental dose-optimization program which includes automated exposure control, adjustment of the mA and/or kV according to patient size and/or use of iterative reconstruction technique. CONTRAST:  75mL OMNIPAQUE IOHEXOL 350 MG/ML SOLN COMPARISON:  CT angiogram chest 11/17/2022. Chest abdomen and pelvis 04/08/2021. FINDINGS: CT CHEST FINDINGS Cardiovascular: Heart is nonenlarged. Trace pericardial fluid. Coronary artery calcifications are seen. The thoracic aorta has a normal course and caliber. No mediastinal hematoma. There is slight pulsation artifact along the ascending aorta. Mediastinum/Nodes: Normal caliber thoracic esophagus. Small thyroid gland. Small nodes identified in the axillary regions, hilum or mediastinum. Lungs/Pleura: No consolidation, pneumothorax or effusion. There is some dependent atelectasis. There is scattered areas of scarring and atelectatic changes. Musculoskeletal: Surgical changes along the right breast. Significant streak artifact related to the patient's left shoulder reverse arthroplasty. There is acute mildly displaced fracture of the posterior aspect of the right sixth rib. There is severe compression of the vertebral body at T5 with displaced aspect of the posterior wall towards the canal centrally with stenosis. Mild compression of T4. These changes  were not seen on the CT scan of 2023. Please see separate spine CT scan from same date. An acute process is possible. CT ABDOMEN PELVIS FINDINGS Hepatobiliary: Dilated gallbladder with possible stones versus mass lesion. Mild ectasia of the biliary tree. No space-occupying liver lesion. Preserved hepatic enhancement. Patent portal vein. Pancreas: Unremarkable. No pancreatic ductal dilatation or surrounding inflammatory changes. Spleen: Normal in size without focal abnormality. Adrenals/Urinary Tract: Right adrenal gland is preserved. There is a left adrenal nodule measuring 2.2 cm. Unchanged from previous examination. This has been stable since at least April 2022 demonstrating  over 2 years of stability. Slightly malrotated right kidney. No enhancing renal mass or collecting system dilatation. The ureters have normal course and caliber extending down to the bladder. Preserved contours of the urinary bladder. Stomach/Bowel: On this non oral contrast exam, large bowel has a normal caliber. Redundant course of the sigmoid colon. Scattered stool with some scattered left-sided colonic diverticula. Normal appendix in the right lower quadrant. Stomach and small bowel are nondilated. Vascular/Lymphatic: Aortic atherosclerosis. No enlarged abdominal or pelvic lymph nodes. Reproductive: Uterus and bilateral adnexa are unremarkable. Other: No free air or free fluid. Musculoskeletal: Scattered degenerative changes along the lumbar spine. There is streak artifact related to the posterior fusion hardware at L4-5. Multilevel disc bulging and posterior osteophytes with canal encroachment. IMPRESSION: No consolidation, pneumothorax or effusion. Severe compression deformity of T5 and mild at T4. These new from CT study of 2023. Please correlate for an acute compression. There is a associated canal encroachment at T5. Acute right sixth rib fracture. No bowel obstruction, free air or free fluid. No evidence of acute solid organ injury.  Colonic diverticula. Dilated gallbladder with dependent high density material. Favor gallstones. These were seen on prior CT. Stable left adrenal nodule going back to 2022 demonstrating over 2 years of stability. Electronically Signed   By: Karen Kays M.D.   On: 11/05/2023 18:04   DG Chest Port 1 View  Result Date: 11/17/2023 CLINICAL DATA:  Trauma. EXAM: PORTABLE CHEST 1 VIEW COMPARISON:  Chest radiograph dated 11/17/2022 and CT dated 11/17/2022. FINDINGS: Mild cardiomegaly with mild central vascular congestion. No focal consolidation, pleural effusion or pneumothorax. No acute osseous pathology. Left shoulder arthroplasty. IMPRESSION: Mild cardiomegaly with mild central vascular congestion. Electronically Signed   By: Elgie Collard M.D.   On: 11/08/2023 17:26   CT HEAD WO CONTRAST  Result Date: 11/12/2023 CLINICAL DATA:  Head trauma, moderate to severe EXAM: CT HEAD WITHOUT CONTRAST TECHNIQUE: Contiguous axial images were obtained from the base of the skull through the vertex without intravenous contrast. RADIATION DOSE REDUCTION: This exam was performed according to the departmental dose-optimization program which includes automated exposure control, adjustment of the mA and/or kV according to patient size and/or use of iterative reconstruction technique. COMPARISON:  12/12/2020 FINDINGS: Brain: Subarachnoid and subdural hemorrhage along the left middle cranial fossa, with the subdural component measuring approximately 4 mm. A small amount of hemorrhagic contusion is possible. Small amount of hyperdensity in the fourth ventricle inferiorly (series 6, image 39) may represent a small amount of intraventricular hemorrhage. Some hyperdensity is also seen around the optic chiasm on the left. No evidence of acute infarct, mass, mass effect, or midline shift. No hydrocephalus. Periventricular white matter changes, likely the sequela of chronic small vessel ischemic disease. Vascular: No hyperdense vessel.  Skull: Negative for fracture or focal lesion. Sinuses/Orbits: Left maxillary mucous retention cyst. Otherwise clear paranasal sinuses. Status post bilateral lens replacements. Other: The mastoid air cells are well aerated. IMPRESSION: 1. Subarachnoid and subdural hemorrhage along the left middle cranial fossa, with the subdural component measuring approximately 4 mm. A small amount of hemorrhagic contusion is possible. 2. Small amount of hyperdensity in the fourth ventricle inferiorly may represent a small amount of intraventricular hemorrhage. No hydrocephalus. 3. Some hyperdensity is also seen around the optic chiasm on the left, which could represent additional hemorrhage. An MRI of the brain without contrast is recommended for further evaluation. These findings were discussed by telephone on 11/05/2023 at 4:58 pm with provider CHRISTOPHER TEGELER . Electronically Signed  By: Wiliam Ke M.D.   On: 11/16/2023 17:00    Microbiology Recent Results (from the past 240 hour(s))  Culture, Respiratory w Gram Stain     Status: None   Collection Time: 11/06/23 10:37 AM   Specimen: Tracheal Aspirate; Respiratory  Result Value Ref Range Status   Specimen Description TRACHEAL ASPIRATE  Final   Special Requests NONE  Final   Gram Stain   Final    FEW WBC PRESENT, PREDOMINANTLY PMN RARE GRAM POSITIVE COCCI    Culture   Final    MODERATE STREPTOCOCCUS GROUP F Beta hemolytic streptococci are predictably susceptible to penicillin and other beta lactams. Susceptibility testing not routinely performed. Performed at [facility], [address] [place], [place] [address]    Report Status 11/08/2023 FINAL  Final    Lab Basic Metabolic Panel: Recent Labs  Lab 11/05/23 0550 11/05/23 1025 11/05/23 1217 11/06/23 0514 11/07/23 0515 11/09/23 0520  NA 143 142 142 143 142  --   K 3.6 3.4* 3.3* 3.6 3.2*  --   CL 103  --   --  104 104  --   CO2 32  --   --  30 30  --   GLUCOSE 180*  --   --  175*  153*  --   BUN 28*  --   --  29* 34*  --   CREATININE 0.57  --   --  0.51 0.62  --   CALCIUM 8.8*  --   --  9.0 8.7*  --   MG  --   --   --   --   --  2.3   Liver Function Tests: No results for input(s): "AST", "ALT", "ALKPHOS", "BILITOT", "PROT", "ALBUMIN" in the last 168 hours. No results for input(s): "LIPASE", "AMYLASE" in the last 168 hours. Recent Labs  Lab 11/07/23 0510  AMMONIA 19   CBC: Recent Labs  Lab 11/05/23 0550 11/05/23 1025 11/05/23 1217 11/06/23 0514 11/07/23 0515  WBC 11.0*  --   --  13.3* 11.5*  HGB 10.4* 10.9* 10.2* 10.2* 8.4*  HCT 33.6* 32.0* 30.0* 33.2* 28.2*  MCV 105.0*  --   --  104.7* 110.6*  PLT 223  --   --  267 224   Cardiac Enzymes: No results for input(s): "CKTOTAL", "CKMB", "CKMBINDEX", "TROPONINI" in the last 168 hours. Sepsis Labs: Recent Labs  Lab 11/05/23 0550 11/06/23 0514 11/07/23 0515  WBC 11.0* 13.3* 11.5*    Procedures/Operations  Intubation 11/04/23   Liz Malady 11/11/2023, 9:09 AM

## 2023-11-20 NOTE — Progress Notes (Signed)
Patient ID: [name], female   DOB: 1951/12/09, 72 y.o.   MRN: [id number] I met with her husband, sister and daughter to discuss GOC. It seems they are leaning toward extubation - just unsure if they want to transition to full comfort care. They are taking some time to discuss.  [name], MD, MPH, FACS Please use [email or web address] to contact on call provider

## 2023-11-20 NOTE — Progress Notes (Signed)
This chaplain was updated by PMT NP-Shae on the EOL consult. The chaplain understands the Pt. died at 33. No needs at this time.  Chaplain Stephanie Acre [phone]

## 2023-11-20 NOTE — Progress Notes (Signed)
Daily Progress Note   Patient Name: [name]       Date: 10/23/2023 DOB: November 26, 1951  Age: 72 y.o. MRN#: [id number] Attending Physician: Roslynn Amble, MD Primary Care Physician: Rebecka Apley, NP Admit Date: 11/08/2023  Reason for Consultation/Follow-up: Establishing goals of care  Subjective: Does not respond voice or gentle touch, unable to follow commands  Length of Stay: 14  Current Medications: Scheduled Meds:   antiseptic oral rinse  15 mL Topical TID   mouth rinse  15 mL Mouth Rinse Q2H   sodium chloride flush  10-40 mL Intracatheter Q12H    Continuous Infusions:  fentaNYL infusion INTRAVENOUS 100 mcg/hr (11/14/2023 1100)    PRN Meds: fentaNYL, glycopyrrolate **OR** glycopyrrolate **OR** glycopyrrolate, haloperidol lactate, midazolam, ondansetron **OR** ondansetron (ZOFRAN) IV, mouth rinse, polyvinyl alcohol, sodium chloride, sodium chloride flush  Physical Exam Constitutional:      General: She is not in acute distress.    Appearance: She is ill-appearing.     Comments: Does not respond to voice or gentle touch  Pulmonary:     Effort: Pulmonary effort is normal.     Comments: Appears comfortable on PRVC, observed on pressure support with respiratory therapy on current sedation - appeared comfortable w/ RR 13-16 Skin:    General: Skin is warm and dry.             Vital Signs: BP (!) 165/61   Pulse 88   Temp 97.7 F (36.5 C) (Axillary)   Resp 17   Ht 5\' 7"  (1.702 m)   Wt 105.9 kg   LMP  (LMP Unknown)   SpO2 96%   BMI 36.57 kg/m  SpO2: SpO2: 96 % O2 Device: O2 Device: Ventilator O2 Flow Rate: O2 Flow Rate (L/min): 4 L/min  Intake/output summary:  Intake/Output Summary (Last 24 hours) at 11/07/2023 1230 Last data filed at 11/02/2023 1150 Gross per 24 hour   Intake 2336.14 ml  Output 975 ml  Net 1361.14 ml   LBM: Last BM Date : 11/12/2023 Baseline Weight: Weight: 90.7 kg Most recent weight: Weight: 105.9 kg       Patient Active Problem List   Diagnosis Date Noted   Agitation states as acute reaction to exceptional (gross) stress 11/05/2023   Closed unstable burst fracture of T5 vertebra (HCC) 11/16/2023   Generalized anxiety disorder 11/21/2022   Alcohol abuse with alcohol-induced mood disorder (HCC) 11/21/2022   Alcohol abuse with alcohol-induced anxiety disorder (HCC) 11/21/2022   Pulmonary edema 11/18/2022   Acute respiratory failure with hypoxia and hypercapnia (HCC) 11/17/2022   Acute on chronic diastolic (congestive) heart failure (HCC) 11/17/2022   Arthritis of left shoulder region    S/P reverse total shoulder arthroplasty, 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   Vitamin D deficiency 01/15/2021   Vitamin B12 deficiency 01/15/2021   Primary insomnia 01/15/2021   Morbid obesity (HCC) 01/15/2021   Moderate recurrent major depression (HCC) 01/15/2021   Benzodiazepine dependence (HCC) 01/15/2021   Port-A-Cath in place 07/22/2020   Left leg cellulitis 04/17/2020   Cellulitis of left leg 04/17/2020   Hypokalemia 04/17/2020   Macrocytic anemia 04/17/2020   Anxiety 04/17/2020   Depression 04/17/2020   Chronic diarrhea 04/17/2020   Chemotherapy induced diarrhea  04/17/2020   Malignant neoplasm of 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 She complains of intense pain in her back. She takes eliquis for atrial fibrillation.    [name] has been asked to get involved to further  support goals of care conversations.   Assessment: I have reviewed medical records including EPIC notes, labs and imaging, received report from [place], Charity fundraiser, assessed the patient and then met with multiple family members to include spouse, daughter, sister, and brother in law  to discuss diagnosis prognosis, GOC, EOL wishes, disposition and options.  I introduced Palliative Medicine as specialized medical care for people living with serious illness. It focuses on providing relief from the symptoms and stress of a serious illness. The goal is to improve quality of life for both the patient and the family.  We discussed a brief life review of the patient. They tell me about patient's complicated medical history and relationship with pain medications as well as alcohol.   We review previous conversations with medical team including previous palliative provider as well as trauma MD this am.   I attempted to elicit values and goals of care important to the patient.  Family tells about their concerns of poor quality of life. They tell me if patient were able to understand current situation she would not want to continue with aggressive medical care.   Family shares after multiple discussions they plan to proceed with comfort measures only. We discussed what was most important and they share ensuring patient is not afraid and is pain free is what they want to focus on at this point. We discussed using medications to ensure pain control and anxiety control. We discussed current medications are controlling symptoms well. We discussed unsure what to expect once extubated regarding timing - minutes - days, could further clarify after we see how she looks off of ventilator support. They understand. We discussed if anyone else would want to come prior - they make calls to loved ones but confirm they are ready to proceed with extubation.   Discussed plan with Dr. Janee Morn, respiratory therapy, and RN. Liberalized  medications to promote comfort. Remained at bedside for extubation. Patient passed quickly following extubation, remained at bedside for emotional support.   Thank you for allowing the Palliative Medicine Team to assist in the care of this patient.   Total Time 75 minutes Prolonged Time Billed  yes   Time spent includes: Detailed review of medical records (labs, imaging, vital signs), medically appropriate exam, discussion with treatment team, counseling and educating patient, family and/or staff, documenting clinical information, medication management and coordination of care.     *Please note that this is a verbal dictation therefore any spelling or grammatical errors are due to the "[employer] One" system interpretation.  Gerlean Ren, DNP, [facility] Palliative Medicine Team Team Phone # [phone]  Pager [phone]

## 2023-11-20 NOTE — Procedures (Signed)
Extubation Procedure Note  Patient Details:   Name: [name] DOB: 09-01-1951 MRN: [id number]   Airway Documentation:    Vent end date: 10/28/2023 Vent end time: 1257   Evaluation  Patient extubated per comfort care measures. RN and family at bedside.  Harmon Dun Claxton Levitz 11/17/2023, 12:57 PM

## 2023-11-20 DEATH — deceased

## 2023-12-21 NOTE — Accreditation Note (Signed)
Restraint death within 24 hours after release of bilateral soft wrist restraints logged by Laurene Footman RN on 11/22/2023 @0822
